# Patient Record
Sex: Male | Born: 1950
Health system: Southern US, Community
[De-identification: ages and names within clinical notes are randomized; demographics above are authoritative.]

## PROBLEM LIST (undated history)

## (undated) DIAGNOSIS — G893 Neoplasm related pain (acute) (chronic): Secondary | ICD-10-CM

## (undated) DIAGNOSIS — Z87442 Personal history of urinary calculi: Secondary | ICD-10-CM

## (undated) DIAGNOSIS — M2550 Pain in unspecified joint: Secondary | ICD-10-CM

## (undated) DIAGNOSIS — E785 Hyperlipidemia, unspecified: Secondary | ICD-10-CM

## (undated) DIAGNOSIS — Z9289 Personal history of other medical treatment: Secondary | ICD-10-CM

## (undated) DIAGNOSIS — I2510 Atherosclerotic heart disease of native coronary artery without angina pectoris: Secondary | ICD-10-CM

## (undated) DIAGNOSIS — M79601 Pain in right arm: Secondary | ICD-10-CM

## (undated) DIAGNOSIS — C61 Malignant neoplasm of prostate: Secondary | ICD-10-CM

## (undated) DIAGNOSIS — C419 Malignant neoplasm of bone and articular cartilage, unspecified: Secondary | ICD-10-CM

## (undated) DIAGNOSIS — M79671 Pain in right foot: Secondary | ICD-10-CM

## (undated) DIAGNOSIS — I10 Essential (primary) hypertension: Secondary | ICD-10-CM

## (undated) DIAGNOSIS — M79604 Pain in right leg: Secondary | ICD-10-CM

## (undated) DIAGNOSIS — M549 Dorsalgia, unspecified: Secondary | ICD-10-CM

## (undated) DIAGNOSIS — Z720 Tobacco use: Secondary | ICD-10-CM

## (undated) DIAGNOSIS — F329 Major depressive disorder, single episode, unspecified: Secondary | ICD-10-CM

## (undated) DIAGNOSIS — F32A Depression, unspecified: Secondary | ICD-10-CM

## (undated) HISTORY — PX: CARDIAC CATHETERIZATION: SHX172

## (undated) HISTORY — DX: Dorsalgia, unspecified: M54.9

## (undated) HISTORY — DX: Malignant neoplasm of prostate: C61

## (undated) HISTORY — PX: SPINE SURGERY: SHX786

## (undated) HISTORY — DX: Neoplasm related pain (acute) (chronic): G89.3

## (undated) HISTORY — DX: Pain in unspecified joint: M25.50

## (undated) HISTORY — PX: CERVIX SURGERY: SHX593

## (undated) HISTORY — PX: WRIST SURGERY: SHX841

## (undated) HISTORY — DX: Malignant neoplasm of bone and articular cartilage, unspecified: C41.9

## (undated) HISTORY — DX: Essential (primary) hypertension: I10

## (undated) HISTORY — DX: Personal history of urinary calculi: Z87.442

## (undated) HISTORY — PX: TONSILLECTOMY: SUR1361

## (undated) HISTORY — PX: PROSTATECTOMY: SHX69

## (undated) HISTORY — DX: Depression, unspecified: F32.A

## (undated) HISTORY — DX: Major depressive disorder, single episode, unspecified: F32.9

---

## 2004-08-17 ENCOUNTER — Ambulatory Visit: Payer: Self-pay | Admitting: Family Medicine

## 2005-07-19 ENCOUNTER — Ambulatory Visit: Payer: Self-pay | Admitting: Internal Medicine

## 2006-03-22 ENCOUNTER — Ambulatory Visit: Payer: Self-pay | Admitting: Cardiovascular Disease

## 2007-04-23 ENCOUNTER — Ambulatory Visit: Payer: Medicare Other | Admitting: Internal Medicine

## 2007-09-19 ENCOUNTER — Ambulatory Visit: Payer: Medicare Other | Admitting: Internal Medicine

## 2007-12-31 ENCOUNTER — Ambulatory Visit: Payer: Medicare Other | Admitting: Internal Medicine

## 2008-04-06 ENCOUNTER — Ambulatory Visit: Payer: Self-pay | Admitting: Internal Medicine

## 2008-06-03 ENCOUNTER — Ambulatory Visit: Payer: Self-pay | Admitting: Internal Medicine

## 2008-06-22 ENCOUNTER — Ambulatory Visit: Payer: Self-pay | Admitting: Internal Medicine

## 2009-01-14 ENCOUNTER — Ambulatory Visit: Payer: Self-pay | Admitting: Family Medicine

## 2009-04-29 ENCOUNTER — Ambulatory Visit: Payer: Self-pay | Admitting: Family Medicine

## 2010-02-09 ENCOUNTER — Emergency Department: Payer: Self-pay | Admitting: Unknown Physician Specialty

## 2010-08-01 DIAGNOSIS — M549 Dorsalgia, unspecified: Secondary | ICD-10-CM | POA: Insufficient documentation

## 2010-08-01 DIAGNOSIS — G2581 Restless legs syndrome: Secondary | ICD-10-CM | POA: Insufficient documentation

## 2010-08-19 ENCOUNTER — Ambulatory Visit: Payer: Self-pay | Admitting: Family Medicine

## 2010-09-02 ENCOUNTER — Ambulatory Visit: Payer: Self-pay | Admitting: Family Medicine

## 2012-05-14 ENCOUNTER — Ambulatory Visit: Payer: Self-pay | Admitting: Internal Medicine

## 2012-07-15 ENCOUNTER — Ambulatory Visit: Payer: Self-pay | Admitting: Family Medicine

## 2012-07-15 LAB — COMPREHENSIVE METABOLIC PANEL
Alkaline Phosphatase: 74 U/L (ref 50–136)
Anion Gap: 11 (ref 7–16)
Bilirubin,Total: 0.6 mg/dL (ref 0.2–1.0)
Calcium, Total: 9.6 mg/dL (ref 8.5–10.1)
Chloride: 103 mmol/L (ref 98–107)
EGFR (African American): >60
EGFR (Non-African Amer.): >60
Glucose: 117 mg/dL — ABNORMAL HIGH (ref 65–99)

## 2012-07-15 LAB — LIPID PANEL
Cholesterol: 204 mg/dL — ABNORMAL HIGH (ref 0–200)
Ldl Cholesterol, Calc: 106 mg/dL — ABNORMAL HIGH (ref 0–100)
Triglycerides: 226 mg/dL — ABNORMAL HIGH (ref 0–200)
VLDL Cholesterol, Calc: 45 mg/dL — ABNORMAL HIGH (ref 5–40)

## 2012-07-15 LAB — CBC WITH DIFFERENTIAL/PLATELET
Eosinophil #: 0.2 10*3/uL (ref 0.0–0.7)
Eosinophil %: 2.5 %
HCT: 47.4 % (ref 40.0–52.0)
Lymphocyte #: 2.1 10*3/uL (ref 1.0–3.6)
MCH: 31.2 pg (ref 26.0–34.0)
MCV: 91 fL (ref 80–100)
Monocyte #: 0.5 x10 3/mm (ref 0.2–1.0)
Monocyte %: 8.7 %
RBC: 5.2 10*6/uL (ref 4.40–5.90)
RDW: 13.7 % (ref 11.5–14.5)
WBC: 6 10*3/uL (ref 3.8–10.6)

## 2012-07-16 LAB — PSA: PSA: 14.2 ng/mL — ABNORMAL HIGH (ref 0.0–4.0)

## 2012-08-08 DIAGNOSIS — K219 Gastro-esophageal reflux disease without esophagitis: Secondary | ICD-10-CM | POA: Insufficient documentation

## 2012-08-08 DIAGNOSIS — R399 Unspecified symptoms and signs involving the genitourinary system: Secondary | ICD-10-CM | POA: Insufficient documentation

## 2012-08-08 DIAGNOSIS — N529 Male erectile dysfunction, unspecified: Secondary | ICD-10-CM | POA: Insufficient documentation

## 2012-08-13 ENCOUNTER — Ambulatory Visit: Payer: Self-pay | Admitting: Internal Medicine

## 2012-08-15 ENCOUNTER — Ambulatory Visit: Payer: Self-pay | Admitting: Internal Medicine

## 2012-08-15 ENCOUNTER — Ambulatory Visit: Payer: Self-pay | Admitting: Hematology and Oncology

## 2012-08-15 LAB — HEPATIC FUNCTION PANEL A (ARMC)
Albumin: 4.1 g/dL (ref 3.4–5.0)
Alkaline Phosphatase: 74 U/L (ref 50–136)
Bilirubin,Total: 0.4 mg/dL (ref 0.2–1.0)
SGOT(AST): 19 U/L (ref 15–37)
SGPT (ALT): 29 U/L (ref 12–78)
Total Protein: 7.4 g/dL (ref 6.4–8.2)

## 2012-08-15 LAB — BASIC METABOLIC PANEL
Anion Gap: 6 — ABNORMAL LOW (ref 7–16)
Calcium, Total: 9.3 mg/dL (ref 8.5–10.1)
Chloride: 105 mmol/L (ref 98–107)
Co2: 30 mmol/L (ref 21–32)
EGFR (African American): >60
Osmolality: 280 (ref 275–301)
Potassium: 4.7 mmol/L (ref 3.5–5.1)
Sodium: 141 mmol/L (ref 136–145)

## 2012-08-15 LAB — APTT: Activated PTT: 33.6 secs (ref 23.6–35.9)

## 2012-08-15 LAB — PROTIME-INR: INR: 0.9

## 2012-08-15 LAB — CBC CANCER CENTER
Basophil #: 0 x10 3/mm (ref 0.0–0.1)
Basophil %: 0.7 %
Eosinophil #: 0.2 x10 3/mm (ref 0.0–0.7)
Eosinophil %: 3.4 %
HGB: 15.7 g/dL (ref 13.0–18.0)
Lymphocyte #: 2.3 x10 3/mm (ref 1.0–3.6)
Lymphocyte %: 31.5 %
MCV: 91 fL (ref 80–100)
Monocyte %: 9.7 %
Neutrophil %: 54.7 %
Platelet: 120 x10 3/mm — ABNORMAL LOW (ref 150–440)
RBC: 5.02 10*6/uL (ref 4.40–5.90)
RDW: 13.6 % (ref 11.5–14.5)
WBC: 7.2 x10 3/mm (ref 3.8–10.6)

## 2012-08-16 LAB — PSA: PSA: 14.6 ng/mL — ABNORMAL HIGH (ref 0.0–4.0)

## 2012-09-02 ENCOUNTER — Ambulatory Visit: Payer: Self-pay | Admitting: Internal Medicine

## 2012-10-02 ENCOUNTER — Ambulatory Visit: Payer: Self-pay | Admitting: Internal Medicine

## 2012-10-09 ENCOUNTER — Ambulatory Visit (INDEPENDENT_AMBULATORY_CARE_PROVIDER_SITE_OTHER): Payer: 59 | Admitting: Cardiovascular Disease

## 2012-10-09 ENCOUNTER — Encounter: Payer: Self-pay | Admitting: Cardiovascular Disease

## 2012-10-09 VITALS — BP 120/84 | HR 73 | Ht 71.0 in | Wt 207.8 lb

## 2012-10-09 DIAGNOSIS — R262 Difficulty in walking, not elsewhere classified: Secondary | ICD-10-CM

## 2012-10-09 DIAGNOSIS — R079 Chest pain, unspecified: Secondary | ICD-10-CM

## 2012-10-09 DIAGNOSIS — R0602 Shortness of breath: Secondary | ICD-10-CM

## 2012-10-09 DIAGNOSIS — M549 Dorsalgia, unspecified: Secondary | ICD-10-CM

## 2012-10-09 DIAGNOSIS — I2510 Atherosclerotic heart disease of native coronary artery without angina pectoris: Secondary | ICD-10-CM | POA: Insufficient documentation

## 2012-10-09 DIAGNOSIS — F17200 Nicotine dependence, unspecified, uncomplicated: Secondary | ICD-10-CM | POA: Insufficient documentation

## 2012-10-09 HISTORY — DX: Dorsalgia, unspecified: M54.9

## 2012-10-09 NOTE — Assessment & Plan Note (Signed)
As he is unable to treadmill secondary to his back pain/chronic back disease and shortness of breath, pharmacologic Myoview will be ordered.

## 2012-10-09 NOTE — Assessment & Plan Note (Signed)
He has 40% mid LAD and RCA disease by cardiac catheterization in 2008. Continues to smoke, now with chest pain and worsening shortness of breath. We have discussed with him the various treatment options including stress testing and cardiac catheterization. He prefers stress testing or this will be arranged for him next week.

## 2012-10-09 NOTE — Patient Instructions (Addendum)
You are doing well. No medication changes were made.  [facility] MYOVIEW  Your caregiver has ordered a Stress Test with nuclear imaging. The purpose of this test is to evaluate the blood supply to your heart muscle. This procedure is referred to as a "Non-Invasive Stress Test." This is because other than having an IV started in your vein, nothing is inserted or "invades" your body. Cardiac stress tests are done to find areas of poor blood flow to the heart by determining the extent of coronary artery disease (CAD). Some patients exercise on a treadmill, which naturally increases the blood flow to your heart, while others who are  unable to walk on a treadmill due to physical limitations have a pharmacologic/chemical stress agent called Lexiscan . This medicine will mimic walking on a treadmill by temporarily increasing your coronary blood flow.   Please note: these test may take anywhere between 2-4 hours to complete  PLEASE REPORT TO [facility]  THE VOLUNTEERS AT THE FIRST DESK WILL DIRECT YOU WHERE TO GO  Date of Procedure:______Wednesday, Oct 15______________  Arrival Time for Procedure:__________7:45 am_____________  Instructions regarding medication:    __X__:  Hold metoprolol the morning of procedure   PLEASE NOTIFY THE OFFICE AT LEAST 24 HOURS IN ADVANCE IF YOU ARE UNABLE TO KEEP YOUR APPOINTMENT.  [phone] AND  PLEASE NOTIFY NUCLEAR MEDICINE AT [facility] AT LEAST 24 HOURS IN ADVANCE IF YOU ARE UNABLE TO KEEP YOUR APPOINTMENT. [phone]  How to prepare for your Myoview test:  1. Do not eat or drink after midnight 2. No caffeine for 24 hours prior to test 3. No smoking 24 hours prior to test. 4. Your medication may be taken with water.  If your doctor stopped a medication because of this test, do not take that medication. 5. Ladies, please do not wear dresses.  Skirts or pants are appropriate. Please wear a short sleeve shirt. 6. No perfume, cologne or  lotion. 7. Wear comfortable walking shoes. No heels!            Please call us if you have new issues that need to be addressed before your next appt.  Your physician wants you to follow-up in: 12 months.  You will receive a reminder letter in the mail two months in advance. If you don't receive a letter, please call our office to schedule the follow-up appointment.
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
## 2012-10-09 NOTE — Assessment & Plan Note (Signed)
Etiology of his shortness of breath is unclear. Unable to exclude ischemia. Also likely has underlying COPD. If no ischemia on stress test, he may benefit from regular exercise program and possibly a trial with inhalers.

## 2012-10-09 NOTE — Assessment & Plan Note (Signed)
High risk for worsening coronary disease. Encouraged him to stop smoking. Stress test will be arranged. He is unable to treadmill. Pharmacologic Myoview will be scheduled.

## 2012-10-09 NOTE — Assessment & Plan Note (Signed)
We have encouraged him to continue to work on weaning his cigarettes and smoking cessation. He will continue to work on this and does not want any assistance with chantix.  

## 2012-10-16 ENCOUNTER — Ambulatory Visit: Payer: Self-pay | Admitting: Cardiovascular Disease

## 2012-10-16 DIAGNOSIS — R0602 Shortness of breath: Secondary | ICD-10-CM

## 2012-10-17 ENCOUNTER — Other Ambulatory Visit: Payer: Self-pay

## 2012-10-17 DIAGNOSIS — R0602 Shortness of breath: Secondary | ICD-10-CM

## 2012-10-17 DIAGNOSIS — R262 Difficulty in walking, not elsewhere classified: Secondary | ICD-10-CM

## 2012-10-17 DIAGNOSIS — I2510 Atherosclerotic heart disease of native coronary artery without angina pectoris: Secondary | ICD-10-CM

## 2012-10-17 DIAGNOSIS — R079 Chest pain, unspecified: Secondary | ICD-10-CM

## 2012-10-21 ENCOUNTER — Telehealth: Payer: Self-pay

## 2012-10-21 NOTE — Telephone Encounter (Signed)
Voice mail box not set up.

## 2012-10-21 NOTE — Telephone Encounter (Signed)
Message copied by Marilynne Halsted on Mon Oct 21, 2012 10:00 AM ------      Message from: Robert Short      Created: Sun Oct 20, 2012  6:49 PM       Normal stress test, myoview ------

## 2012-10-21 NOTE — Telephone Encounter (Signed)
Message copied by Marilynne Halsted on Mon Oct 21, 2012 10:11 AM ------      Message from: Antonieta Iba      Created: Sun Oct 20, 2012  6:49 PM       Normal stress test, myoview ------

## 2012-10-22 NOTE — Telephone Encounter (Signed)
Spoke w/ pt.  He is aware of results.  

## 2012-10-22 NOTE — Telephone Encounter (Signed)
Message copied by Marilynne Halsted on Tue Oct 22, 2012  2:53 PM ------      Message from: Antonieta Iba      Created: Sun Oct 20, 2012  6:49 PM       Normal stress test, myoview ------

## 2012-12-05 ENCOUNTER — Ambulatory Visit: Payer: Self-pay | Admitting: Internal Medicine

## 2012-12-05 LAB — CBC CANCER CENTER
Basophil #: 0.1 x10 3/mm (ref 0.0–0.1)
Basophil %: 0.6 %
Eosinophil %: 4.3 %
HGB: 13.8 g/dL (ref 13.0–18.0)
MCV: 95 fL (ref 80–100)
Monocyte #: 0.9 x10 3/mm (ref 0.2–1.0)
Monocyte %: 10.1 %
Neutrophil #: 6.2 x10 3/mm (ref 1.4–6.5)
Neutrophil %: 68.5 %
Platelet: 149 x10 3/mm — ABNORMAL LOW (ref 150–440)
RBC: 4.35 10*6/uL — ABNORMAL LOW (ref 4.40–5.90)
RDW: 13.3 % (ref 11.5–14.5)

## 2012-12-05 LAB — HEPATIC FUNCTION PANEL A (ARMC)
Albumin: 3.9 g/dL (ref 3.4–5.0)
Alkaline Phosphatase: 77 U/L
Bilirubin,Total: 0.4 mg/dL (ref 0.2–1.0)
SGOT(AST): 20 U/L (ref 15–37)

## 2012-12-05 LAB — CREATININE, SERUM
EGFR (African American): >60
EGFR (Non-African Amer.): >60

## 2012-12-06 LAB — PSA: PSA: 0.2 ng/mL (ref 0.0–4.0)

## 2013-01-02 ENCOUNTER — Ambulatory Visit: Payer: Self-pay | Admitting: Internal Medicine

## 2013-04-19 ENCOUNTER — Emergency Department: Payer: Self-pay | Admitting: Emergency Medicine

## 2013-04-19 LAB — COMPREHENSIVE METABOLIC PANEL
ALBUMIN: 4.1 g/dL (ref 3.4–5.0)
ANION GAP: 10 (ref 7–16)
Alkaline Phosphatase: 82 U/L
BUN: 15 mg/dL (ref 7–18)
Bilirubin,Total: 0.2 mg/dL (ref 0.2–1.0)
CHLORIDE: 106 mmol/L (ref 98–107)
CO2: 25 mmol/L (ref 21–32)
CREATININE: 1.06 mg/dL (ref 0.60–1.30)
Calcium, Total: 9.2 mg/dL (ref 8.5–10.1)
EGFR (African American): >60
EGFR (Non-African Amer.): >60
Glucose: 155 mg/dL — ABNORMAL HIGH (ref 65–99)
OSMOLALITY: 285 (ref 275–301)
Potassium: 3.2 mmol/L — ABNORMAL LOW (ref 3.5–5.1)
SGOT(AST): 21 U/L (ref 15–37)
SGPT (ALT): 36 U/L (ref 12–78)
Sodium: 141 mmol/L (ref 136–145)
TOTAL PROTEIN: 7.6 g/dL (ref 6.4–8.2)

## 2013-04-19 LAB — URINALYSIS, COMPLETE
Bacteria: NONE SEEN
Bilirubin,UR: NEGATIVE
Blood: NEGATIVE
LEUKOCYTE ESTERASE: NEGATIVE
NITRITE: NEGATIVE
Ph: 5 (ref 4.5–8.0)
Protein: NEGATIVE
RBC,UR: <1 /HPF (ref 0–5)
SPECIFIC GRAVITY: 1.027 (ref 1.003–1.030)
Squamous Epithelial: 1
WBC UR: 1 /HPF (ref 0–5)

## 2013-04-19 LAB — CBC
HCT: 39.9 % — AB (ref 40.0–52.0)
HGB: 13.2 g/dL (ref 13.0–18.0)
MCH: 31 pg (ref 26.0–34.0)
MCHC: 33 g/dL (ref 32.0–36.0)
MCV: 94 fL (ref 80–100)
Platelet: 145 10*3/uL — ABNORMAL LOW (ref 150–440)
RBC: 4.25 10*6/uL — AB (ref 4.40–5.90)
RDW: 14.2 % (ref 11.5–14.5)
WBC: 7.3 10*3/uL (ref 3.8–10.6)

## 2013-04-19 LAB — TROPONIN I: Troponin-I: <0.02 ng/mL

## 2013-05-23 ENCOUNTER — Ambulatory Visit: Payer: Self-pay | Admitting: Family Medicine

## 2013-07-11 ENCOUNTER — Ambulatory Visit: Payer: Self-pay | Admitting: Otolaryngology

## 2013-07-13 ENCOUNTER — Observation Stay: Payer: Self-pay | Admitting: Internal Medicine

## 2013-07-13 LAB — COMPREHENSIVE METABOLIC PANEL
AST: 26 U/L (ref 15–37)
Albumin: 4.3 g/dL (ref 3.4–5.0)
Alkaline Phosphatase: 74 U/L
Anion Gap: 7 (ref 7–16)
BILIRUBIN TOTAL: 0.4 mg/dL (ref 0.2–1.0)
BUN: 11 mg/dL (ref 7–18)
CHLORIDE: 107 mmol/L (ref 98–107)
CREATININE: 1.01 mg/dL (ref 0.60–1.30)
Calcium, Total: 9.9 mg/dL (ref 8.5–10.1)
Co2: 26 mmol/L (ref 21–32)
EGFR (Non-African Amer.): >60
Glucose: 106 mg/dL — ABNORMAL HIGH (ref 65–99)
OSMOLALITY: 279 (ref 275–301)
Potassium: 4.3 mmol/L (ref 3.5–5.1)
SGPT (ALT): 32 U/L (ref 12–78)
Sodium: 140 mmol/L (ref 136–145)
TOTAL PROTEIN: 8.1 g/dL (ref 6.4–8.2)

## 2013-07-13 LAB — URINALYSIS, COMPLETE
BILIRUBIN, UR: NEGATIVE
Glucose,UR: NEGATIVE mg/dL (ref 0–75)
KETONE: NEGATIVE
NITRITE: POSITIVE
Ph: 6 (ref 4.5–8.0)
RBC,UR: 22 /HPF (ref 0–5)
Specific Gravity: 1.008 (ref 1.003–1.030)
Squamous Epithelial: <1
WBC UR: 13 /HPF (ref 0–5)

## 2013-07-13 LAB — CBC
HCT: 42.7 % (ref 40.0–52.0)
HGB: 14.3 g/dL (ref 13.0–18.0)
MCH: 32.2 pg (ref 26.0–34.0)
MCHC: 33.5 g/dL (ref 32.0–36.0)
MCV: 96 fL (ref 80–100)
Platelet: 169 10*3/uL (ref 150–440)
RBC: 4.44 10*6/uL (ref 4.40–5.90)
RDW: 13.2 % (ref 11.5–14.5)
WBC: 10.7 10*3/uL — ABNORMAL HIGH (ref 3.8–10.6)

## 2013-07-13 LAB — LIPASE, BLOOD: Lipase: 107 U/L (ref 73–393)

## 2013-07-15 LAB — URINE CULTURE

## 2013-07-23 ENCOUNTER — Other Ambulatory Visit: Payer: Self-pay | Admitting: Neurosurgery

## 2013-07-23 DIAGNOSIS — M5126 Other intervertebral disc displacement, lumbar region: Secondary | ICD-10-CM

## 2013-07-24 ENCOUNTER — Other Ambulatory Visit: Payer: Self-pay | Admitting: Neurosurgery

## 2013-07-24 ENCOUNTER — Ambulatory Visit
Admission: RE | Admit: 2013-07-24 | Discharge: 2013-07-24 | Disposition: A | Discharge status: Home / Self Care | Payer: 59 | Source: Ambulatory Visit | Attending: Neurosurgery | Admitting: Neurosurgery

## 2013-07-24 DIAGNOSIS — M5126 Other intervertebral disc displacement, lumbar region: Secondary | ICD-10-CM

## 2013-07-24 MED ORDER — METHYLPREDNISOLONE ACETATE 40 MG/ML INJ SUSP (RADIOLOG
120.0000 mg | Freq: Once | INTRAMUSCULAR | Status: AC
  Administered 2013-07-24: 120 mg via EPIDURAL

## 2013-07-24 MED ORDER — IOHEXOL 180 MG/ML  SOLN
1.0000 mL | Freq: Once | INTRAMUSCULAR | Status: AC | PRN
  Administered 2013-07-24: 1 mL via EPIDURAL

## 2013-07-24 NOTE — Discharge Instructions (Signed)

## 2013-11-03 ENCOUNTER — Ambulatory Visit: Payer: Self-pay

## 2013-11-03 LAB — CBC WITH DIFFERENTIAL/PLATELET
Basophil #: 0.1 10*3/uL (ref 0.0–0.1)
Basophil %: 0.7 %
Eosinophil #: 0.1 10*3/uL (ref 0.0–0.7)
Eosinophil %: 0.6 %
HCT: 44.2 % (ref 40.0–52.0)
HGB: 14.4 g/dL (ref 13.0–18.0)
LYMPHS PCT: 14.2 %
Lymphocyte #: 2 10*3/uL (ref 1.0–3.6)
MCH: 31.1 pg (ref 26.0–34.0)
MCHC: 32.6 g/dL (ref 32.0–36.0)
MCV: 96 fL (ref 80–100)
MONO ABS: 1.3 x10 3/mm — AB (ref 0.2–1.0)
Monocyte %: 9.3 %
Neutrophil #: 10.7 10*3/uL — ABNORMAL HIGH (ref 1.4–6.5)
Neutrophil %: 75.2 %
Platelet: 149 10*3/uL — ABNORMAL LOW (ref 150–440)
RBC: 4.63 10*6/uL (ref 4.40–5.90)
RDW: 13.5 % (ref 11.5–14.5)
WBC: 14.2 10*3/uL — ABNORMAL HIGH (ref 3.8–10.6)

## 2014-04-25 NOTE — H&P (Signed)
PATIENT NAME:  Robert Short, Robert Short MR#:  [id number] DATE OF BIRTH:  1950/09/08  DATE OF ADMISSION:  07/13/2013  PRIMARY CARE PHYSICIAN: Dr. Larene Beach.   REFERRING PHYSICIAN: Dr. Delman Kitten.   CHIEF COMPLAINT: Back pain.   HISTORY OF PRESENT ILLNESS: Robert Short is a 64 year old male with history of chronic back pain, gets the pain all over the back for the last few months on and off. Gets sharp pains in the coccyx area. However, the patient woke up yesterday morning around 4:00, had some mild pain on the right side of the back, more diffuse in nature. Went back to bed. Woke up around 9:00 in the morning, started to experience a severe pain, a cramp-like pain, 10/10 intensity, radiating posterior aspect of the leg into the right knee. The patient took his home pain medications without much improvement, concerning this, came to the Emergency Department. Workup in the Emergency Department: CT abdomen and pelvis negative for any urinary tract stone, negative for any nephrolithiasis, mild urinary bladder thickening. UA shows 2+ blood, nitrite positive, RBC of 22, and WBC of 13. The patient also stated that had some hematuria. CBC is completely within normal limits and CMP are completely within normal limits. The patient also underwent MRI of the spine, showed acute-appearing L3-L4 right central moderate disk extrusion with 14 mm of superior contiguous  likely transversing the right L3 nerve, moderate-to-severe canal stenosis at L3-L4, L2-L3, and L4-L5. Moderate-to-severe on right L5-S1. Concerning these findings, the decision is made to observe the patient and control the pain. The patient denies having any weakness in the right leg. Denies having any urinary incontinence or retention, or fecal incontinence. The patient received multiple doses of pain medications, 10 mg of morphine, 3 mg of Dilaudid and Toradol with some improvement with the pain.   PAST MEDICAL HISTORY:  1. History of prostate cancer. 2.  Hypertension.  3. Hyperlipidemia.  4. Borderline diabetes mellitus.  5. Depression.  6. Prostate cancer, status post prostatectomy in September 2011 with elevated PSA.  7. Migraine headaches.  8. Insomnia.  9. Cataract surgery.  10. Nephrolithiasis.  11. Continued tobacco use.  12. Continued alcohol use.   ALLERGIES: No known drug allergies.   HOME MEDICATIONS:  1. Tylenol 1000 mg every 6 hours as needed.  2. Oxycodone 5 mg every 6 hours as needed.  3. Toprol-XL once a day.  4. Lyrica 75 mg 2 times a day.  5. Etodolac 500 mg 2 times a day.  6. Cymbalta 60 mg once a day.  7. Benicar 40 mg once a day.  8. Baclofen 10 mg 3 times a day.   SOCIAL HISTORY: Continues to smoke 1 pack a day. Drinks alcohol 2 to 3 beers to 6-pack a day. Denies using any illicit drugs. Married, lives with his wife.   FAMILY HISTORY: Breast cancer, lung cancer, and hypertension.   REVIEW OF SYSTEMS:  CONSTITUTIONAL: Denies any generalized weakness.  EYES: No change in vision.  ENT: No change in hearing.  RESPIRATORY: No cough, shortness of breath.  CARDIOVASCULAR: No chest pain, palpations.  GASTROINTESTINAL: No nausea, vomiting, abdominal pain.  GENITOURINARY: No dysuria. Had hematuria.  ENDOCRINE: No polyuria or polydipsia.  SKIN: No rash or lesions.  MUSCULOSKELETAL: Chronic back pain.  NEUROLOGIC: No weakness or numbness in any part of the body.   PHYSICAL EXAMINATION:  GENERAL: This is a well-built, well-nourished, age-appropriate male lying down in the bed, not in distress.  VITAL SIGNS: Temperature 98.3, pulse 62, blood  pressure 163/99, respiratory rate of 18, oxygen saturation is 98% on room air.  HEENT: Head normocephalic, atraumatic. No scleral icterus. Conjunctivae normal. Pupils equal and react to light. Extraocular movements are intact. Mucous membranes moist. No pharyngeal erythema.  NECK: Supple. No lymphadenopathy. No JVD. No carotid bruit. No thyromegaly.  CHEST: Has no focal  tenderness.  LUNGS: Bilaterally clear to auscultation.  HEART: S1, S2 regular. No murmurs are heard.  ABDOMEN: Bowel sounds present. Soft, nontender, nondistended. No hepatosplenomegaly. On the right back side, in the L4-L5 area, has mild tenderness to palpation. Leg raise sign is negative.  EXTREMITIES: No pedal edema. Pulses 2+.  NEUROLOGIC: The patient is alert, oriented to place, person, and time. Cranial nerves II through XII intact. Motor 5/5 in upper and lower extremities.  SKIN: No rash or lesions.  MUSCULOSKELETAL: Good range of motion in all the extremities.   LABORATORIES: CMP is completely within normal limits.   CBC is completely within normal limits.   UA: 2+ blood, trace leukocyte esterase, nitrites positive. WBC of 13.   CT abdomen and pelvis as mentioned above, mild bladder wall thickening.   MRI has multilevel spinal canal stenosis with radiculopathy.   ASSESSMENT AND PLAN: Robert Short is a 64 year old male who comes with acute back pain.  1. Acute back pain. The patient had multiple bulging disks at 2 different levels. We will consult orthopedic surgery in the morning. Continue with the oxycodone as needed. Does not have any neurologic deficits.  2. Urinary tract infection. Urine cultures have been sent. Continue with Rocephin.  3. Hypertension, poorly controlled. The patient states he did not take his medications: Start back on home medications and follow up.  4. Hematuria. The patient has history of prostate cancer. This could be secondary to urinary tract infection.  5. Tobacco use, keep the patient on nicotine patch. 6. Alcohol use. Give thiamine, watch for any signs of alcohol withdrawal.  7. Keep the patient on deep vein thrombosis prophylaxis with sequential compression devices.   TIME SPENT: 50 minutes.   ____________________________ Monica Becton, MD pv:lt D: 07/13/2013 22:36:36 ET T: 07/13/2013 23:31:14 ET JOB#: [id number]  cc: Monica Becton, MD,  <Dictator> Arlis Porta., MD Monica Becton MD ELECTRONICALLY SIGNED 07/16/2013 0:04

## 2014-04-25 NOTE — Discharge Summary (Signed)
PATIENT NAME:  Robert Short, Robert Short MR#:  [id number] DATE OF BIRTH:  10/24/1950  DATE OF ADMISSION:  07/13/2013. DATE OF DISCHARGE:  07/15/2013.  ADMITTING DIAGNOSIS:  Acute onset of worsening lower back pain.  DISCHARGE DIAGNOSES:  1.  Back pain due to disk herniation in the lumbar spine with severe spinal stenosis.  2.  Chronic back pain.  3.  History of prostate cancer.  4.  Hypertension.  5.  Hyperlipidemia.  6.  Borderline diabetes.  7.  Depression.  8.  Prostate cancer, status post prostatectomy in September 2011 with elevated PSA.  9.  Migraine headaches.  10. Insomnia.  11. Status post cataract surgery.  12. History of nephrolithiasis.  13. Continued tobacco use.  14. Continue alcohol use.  15. Urinary tract infection.  CONSULTANTS: Dr. Mack Guise was supposed to see the patient but patient was discharged prior to his evaluation.   LABORATORY DATA: On admission: Glucose 106, BUN 11, creatinine 1.01, sodium 140, potassium 4.3, chloride 107, CO2 26, calcium 9.9, lipase 107. LFTs were normal. WBC 10.7, hemoglobin 14.3, platelet count 169,000.   Urinalysis showed Klebsiella pneumoniae resistant to nitrofurantoin, ampicillin.   HOSPITAL COURSE: Please refer to H and P done by the admitting physician. The patient is a 64 year old white male with history of chronic back pain who presented with acute onset of lower back pain. The patient was seen in the ED and they tried to discharge him but he was still having a lot of pain.  He underwent MRI of his lumbar spine which showed acute-appearing L3-L4 right central moderate disk extrusion with 14 mm of superior contiguous extent likely affecting the traversing right L3 level, no acute fracture or malalignment.  Moderate to severe canal stenosis at L3-L4, neural foraminal narrowing L3-L4 through L5-S1, moderate to severe on the right at L5-S1.   HOSPITAL COURSE: Please refer to H and P done by the admitting physician. The patient is a 64 year old  white male with history of chronic back pain who presented with acute onset of back pain without any trauma or lifting anything heavy. The patient was seen in the ED and continued to have significant pain and therefore underwent a CT of the abdomen and pelvis which was negative. He had an MRI of his spine which did show some disk herniation. Due to his pain not being controlled he was admitted to the hospital. The patient was placed on pain medications to control his symptoms. He was still able to ambulate. He only had pain. An orthopedic consult was obtained,  however, the patient was discharged prior to orthopedic evaluation; however, Dr. Mack Guise called me and told me that the patient should be referred to a neurosurgical spine specialist. He is referred to [facility]. At this time he is stable and stable for discharge.   DISCHARGE MEDICATIONS:  Metoprolol succinate 50 mg daily, Tylenol 500 2 tablets q.6 hours, baclofen 10 mg 1 tablet p.o. t.i.d., Benicar 40 mg daily, Cymbalta 30 mg 2  capsules daily, Lyrica 75 mg, 1 tablet p.o. b.i.d., etodolac 500 mg 1 tablet p.o. b.i.d., Cipro 500 mg 1 tablet p.o. q.12 hours x 5 days, oxycodone 10 mg q.4 hours p.r.n. for pain, prednisone taper starting at 60 mg, taper by 10 mg until complete. Ibuprofen 600 mg 1 tablet 4 times a day as needed for pain, ranitidine 75 mg 1 tablet p.o. p.o. b.i.d.   DIET: Low-sodium, low-fat, low-cholesterol.   ACTIVITY: As tolerated.   FOLLOWUP: With neurosurgery at [facility], follow  with primary MD in 1 to 2 weeks.   TIME SPENT:  35 minutes.   ____________________________ Lafonda Mosses Posey Pronto, MD shp:lt D: 07/16/2013 08:20:00 ET T: 07/16/2013 11:32:43 ET JOB#: [id number]  cc: Monik Lins H. Posey Pronto, MD, <Dictator> Alric Seton MD ELECTRONICALLY SIGNED 07/20/2013 9:46

## 2014-06-03 ENCOUNTER — Telehealth: Payer: Self-pay | Admitting: Family Medicine

## 2014-06-03 MED ORDER — METOPROLOL SUCCINATE ER 50 MG PO TB24: 50.0000 mg | ORAL_TABLET | Freq: Every day | ORAL | Status: DC

## 2014-06-03 MED ORDER — OLMESARTAN MEDOXOMIL 40 MG PO TABS: 40.0000 mg | ORAL_TABLET | Freq: Every day | ORAL | Status: DC

## 2014-06-03 NOTE — Telephone Encounter (Signed)
meds refill

## 2014-06-03 NOTE — Telephone Encounter (Signed)
OK to refill each for 1 month with 1 extra refill.  Get appt. in next 2 months.

## 2014-06-03 NOTE — Telephone Encounter (Signed)
Pt has not been in office since 11/2013 and advised to schedule appointment but he needs refill for Benicar and metoprolol do you want me to send his Rx please suggest

## 2014-06-04 ENCOUNTER — Ambulatory Visit (INDEPENDENT_AMBULATORY_CARE_PROVIDER_SITE_OTHER): Payer: 59 | Admitting: Family Medicine

## 2014-06-04 ENCOUNTER — Encounter: Payer: Self-pay | Admitting: Family Medicine

## 2014-06-04 VITALS — BP 187/101 | HR 81 | Temp 98.3°F | Resp 16 | Ht 71.0 in | Wt 214.8 lb

## 2014-06-04 DIAGNOSIS — F32A Depression, unspecified: Secondary | ICD-10-CM

## 2014-06-04 DIAGNOSIS — R739 Hyperglycemia, unspecified: Secondary | ICD-10-CM | POA: Diagnosis not present

## 2014-06-04 DIAGNOSIS — I10 Essential (primary) hypertension: Secondary | ICD-10-CM | POA: Insufficient documentation

## 2014-06-04 DIAGNOSIS — R200 Anesthesia of skin: Secondary | ICD-10-CM | POA: Insufficient documentation

## 2014-06-04 DIAGNOSIS — K219 Gastro-esophageal reflux disease without esophagitis: Secondary | ICD-10-CM | POA: Diagnosis not present

## 2014-06-04 DIAGNOSIS — F17200 Nicotine dependence, unspecified, uncomplicated: Secondary | ICD-10-CM | POA: Insufficient documentation

## 2014-06-04 DIAGNOSIS — F329 Major depressive disorder, single episode, unspecified: Secondary | ICD-10-CM | POA: Insufficient documentation

## 2014-06-04 DIAGNOSIS — M5441 Lumbago with sciatica, right side: Secondary | ICD-10-CM | POA: Diagnosis not present

## 2014-06-04 DIAGNOSIS — W57XXXA Bitten or stung by nonvenomous insect and other nonvenomous arthropods, initial encounter: Secondary | ICD-10-CM | POA: Insufficient documentation

## 2014-06-04 DIAGNOSIS — G629 Polyneuropathy, unspecified: Secondary | ICD-10-CM | POA: Insufficient documentation

## 2014-06-04 DIAGNOSIS — M4802 Spinal stenosis, cervical region: Secondary | ICD-10-CM | POA: Insufficient documentation

## 2014-06-04 DIAGNOSIS — E785 Hyperlipidemia, unspecified: Secondary | ICD-10-CM | POA: Insufficient documentation

## 2014-06-04 DIAGNOSIS — D8989 Other specified disorders involving the immune mechanism, not elsewhere classified: Secondary | ICD-10-CM | POA: Insufficient documentation

## 2014-06-04 DIAGNOSIS — R5382 Chronic fatigue, unspecified: Secondary | ICD-10-CM

## 2014-06-04 DIAGNOSIS — R413 Other amnesia: Secondary | ICD-10-CM | POA: Insufficient documentation

## 2014-06-04 DIAGNOSIS — G8929 Other chronic pain: Secondary | ICD-10-CM | POA: Insufficient documentation

## 2014-06-04 DIAGNOSIS — M542 Cervicalgia: Secondary | ICD-10-CM

## 2014-06-04 DIAGNOSIS — M25519 Pain in unspecified shoulder: Secondary | ICD-10-CM | POA: Insufficient documentation

## 2014-06-04 DIAGNOSIS — G9332 Myalgic encephalomyelitis/chronic fatigue syndrome: Secondary | ICD-10-CM | POA: Insufficient documentation

## 2014-06-04 MED ORDER — BACLOFEN 20 MG PO TABS: 20.0000 mg | ORAL_TABLET | Freq: Two times a day (BID) | ORAL | Status: DC

## 2014-06-04 MED ORDER — METOPROLOL SUCCINATE ER 50 MG PO TB24: 50.0000 mg | ORAL_TABLET | Freq: Every day | ORAL | Status: DC

## 2014-06-04 MED ORDER — AMLODIPINE BESYLATE 5 MG PO TABS: 5.0000 mg | ORAL_TABLET | Freq: Every day | ORAL | Status: DC

## 2014-06-04 MED ORDER — DULOXETINE HCL 30 MG PO CPEP: 30.0000 mg | ORAL_CAPSULE | Freq: Every day | ORAL | Status: DC

## 2014-06-04 MED ORDER — LOSARTAN POTASSIUM 100 MG PO TABS: 100.0000 mg | ORAL_TABLET | Freq: Every day | ORAL | Status: DC

## 2014-06-04 NOTE — Telephone Encounter (Signed)
Patient had meds refilled a visit today (06/04/14).

## 2014-06-04 NOTE — Progress Notes (Signed)
Subjective:    Patient ID: Robert Short, male    DOB: 1951-01-01, 64 y.o.   MRN: [id number]  HPI: Robert Short is a 64 y.o. male presenting on 06/04/2014 for Hypertension   HPI For f/u of HBP.  Ran  Out of Benicar 1 month ago.  C/o cost.   C/o constant back pain and lack of help for it. Says Duloxetine not help[ing depression, but will not take higher dose.  Past Medical History  Diagnosis Date  . Hypertension   . Depression   . ASCVD (arteriosclerotic cardiovascular disease)   . Back pain   . Joint pain   . Prostate cancer   . Bone cancer   . History of kidney stones    History   Social History  . Marital Status: Married    Spouse Name: N/A  . Number of Children: N/A  . Years of Education: N/A   Occupational History  . Not on file.   Social History Main Topics  . Smoking status: Current Every Day Smoker -- 1.00 packs/day for 40 years    Types: Cigarettes  . Smokeless tobacco: Not on file  . Alcohol Use: Yes     Comment: daily  . Drug Use: No  . Sexual Activity: Not on file   Other Topics Concern  . Not on file   Social History Narrative   Family History  Problem Relation Age of Onset  . Heart attack Mother   . Hypertension Mother   . Heart attack Father    Current Outpatient Prescriptions on File Prior to Visit  Medication Sig  . acetaminophen (TYLENOL) 500 MG tablet Take by mouth.  Marland Kitchen aspirin 81 MG tablet Take 81 mg by mouth daily.  . fluticasone (FLONASE) 50 MCG/ACT nasal spray Place into the nose.  . ibuprofen (ADVIL,MOTRIN) 200 MG tablet Take 200 mg by mouth every 6 (six) hours as needed for pain.  Marland Kitchen etodolac (LODINE) 500 MG tablet Take by mouth.  . olmesartan (BENICAR) 40 MG tablet Take 1 tablet (40 mg total) by mouth daily. (Patient not taking: Reported on 06/04/2014)  . pregabalin (LYRICA) 75 MG capsule Take by mouth.   No current facility-administered medications on file prior to visit.    Review of Systems  Constitutional: Positive for  fatigue.  HENT: Negative.   Respiratory: Negative.   Cardiovascular: Negative.   Gastrointestinal: Negative.   Endocrine: Negative for polydipsia, polyphagia and polyuria.  Genitourinary: Negative.   Musculoskeletal: Positive for back pain and neck pain.  Psychiatric/Behavioral: Positive for decreased concentration and agitation. The patient is nervous/anxious.    Per HPI unless specifically indicated above     Objective:    BP 187/101 mmHg  Pulse 81  Temp(Src) 98.3 F (36.8 C) (Oral)  Resp 16  Ht 5\' 11"  (1.803 m)  Wt 214 lb 12.8 oz (97.433 kg)  BMI 29.97 kg/m2  Wt Readings from Last 3 Encounters:  06/04/14 214 lb 12.8 oz (97.433 kg)  11/03/13 210 lb (95.255 kg)  10/09/12 207 lb 12 oz (94.235 kg)    Physical Exam  Constitutional: He is oriented to person, place, and time. He appears well-developed and well-nourished.  Eyes: Pupils are equal, round, and reactive to light.  Neck: Normal range of motion. Neck supple. No thyromegaly present.  Cardiovascular: Normal rate, regular rhythm, normal heart sounds and intact distal pulses.  Exam reveals no gallop and no friction rub.   No murmur heard. Pulmonary/Chest: Effort normal and breath sounds normal.  No respiratory distress. He has no wheezes. He has no rales. He exhibits no tenderness.  Musculoskeletal:  Moves slowly from back pain from neck to lumbar spine.  Movement is slowed and painful.  Lymphadenopathy:    He has no cervical adenopathy.  Neurological: He is alert and oriented to person, place, and time.  Psychiatric:  Affect is depressed.  Non-suicidal.  Vitals reviewed.      Assessment & Plan:   Problem List Items Addressed This Visit    Back pain   Relevant Medications   baclofen (LIORESAL) 20 MG tablet   Other Relevant Orders   Ambulatory referral to Pain Clinic   Clinical depression   Relevant Medications   DULoxetine (CYMBALTA) 30 MG capsule   Essential (primary) hypertension - Primary   Relevant  Medications   losartan (COZAAR) 100 MG tablet   amLODipine (NORVASC) 5 MG tablet   metoprolol succinate (TOPROL-XL) 50 MG 24 hr tablet   Acid reflux   Blood glucose elevated      Meds ordered this encounter  Medications  . losartan (COZAAR) 100 MG tablet    Sig: Take 1 tablet (100 mg total) by mouth daily.    Dispense:  90 tablet    Refill:  3  . amLODipine (NORVASC) 5 MG tablet    Sig: Take 1 tablet (5 mg total) by mouth daily.    Dispense:  30 tablet    Refill:  12  . baclofen (LIORESAL) 20 MG tablet    Sig: Take 1 tablet (20 mg total) by mouth 2 (two) times daily.    Dispense:  60 each    Refill:  12  . DULoxetine (CYMBALTA) 30 MG capsule    Sig: Take 1 capsule (30 mg total) by mouth daily.    Dispense:  30 capsule    Refill:  12  . metoprolol succinate (TOPROL-XL) 50 MG 24 hr tablet    Sig: Take 1 tablet (50 mg total) by mouth daily. Take with or immediately following a meal.    Dispense:  30 tablet    Refill:  12      Follow up plan: Return in about 1 month (around 07/04/2014), or if symptoms worsen or fail to improve.

## 2014-06-05 ENCOUNTER — Telehealth: Payer: Self-pay

## 2014-06-05 NOTE — Telephone Encounter (Signed)
Need to send the paperwork for [facility]. [facility]

## 2014-06-05 NOTE — Telephone Encounter (Signed)
Left message

## 2014-06-17 ENCOUNTER — Ambulatory Visit: Payer: Self-pay | Admitting: Family Medicine

## 2014-06-18 ENCOUNTER — Telehealth: Payer: Self-pay | Admitting: Family Medicine

## 2014-06-18 NOTE — Telephone Encounter (Signed)
Spoke to [place] from [facility] who needs the last three clinical notes for the pt. She also states if there were any MRIs or testing of this nature she will need those reports.  Call back [phone] Fax number [phone]

## 2014-06-18 NOTE — Telephone Encounter (Signed)
Faxed.[facility]

## 2014-06-29 ENCOUNTER — Telehealth: Payer: Self-pay | Admitting: Family Medicine

## 2014-06-29 NOTE — Telephone Encounter (Signed)
Notes faxed.

## 2014-06-29 NOTE — Telephone Encounter (Signed)
Spoke to [place] requesting at least 3 clinical visit notes and any labs or testing recently done.

## 2014-07-02 DIAGNOSIS — M5416 Radiculopathy, lumbar region: Secondary | ICD-10-CM | POA: Diagnosis not present

## 2014-07-02 DIAGNOSIS — C61 Malignant neoplasm of prostate: Secondary | ICD-10-CM | POA: Diagnosis not present

## 2014-07-02 DIAGNOSIS — M545 Low back pain: Secondary | ICD-10-CM | POA: Diagnosis not present

## 2014-07-02 DIAGNOSIS — Z79899 Other long term (current) drug therapy: Secondary | ICD-10-CM | POA: Diagnosis not present

## 2014-07-02 DIAGNOSIS — E668 Other obesity: Secondary | ICD-10-CM | POA: Diagnosis not present

## 2014-07-02 DIAGNOSIS — M542 Cervicalgia: Secondary | ICD-10-CM | POA: Diagnosis not present

## 2014-07-02 DIAGNOSIS — F419 Anxiety disorder, unspecified: Secondary | ICD-10-CM | POA: Diagnosis not present

## 2014-07-02 DIAGNOSIS — F329 Major depressive disorder, single episode, unspecified: Secondary | ICD-10-CM | POA: Diagnosis not present

## 2014-07-02 DIAGNOSIS — C801 Malignant (primary) neoplasm, unspecified: Secondary | ICD-10-CM | POA: Diagnosis not present

## 2014-07-02 DIAGNOSIS — G8929 Other chronic pain: Secondary | ICD-10-CM | POA: Diagnosis not present

## 2014-07-02 DIAGNOSIS — Z87891 Personal history of nicotine dependence: Secondary | ICD-10-CM | POA: Diagnosis not present

## 2014-07-02 DIAGNOSIS — E669 Obesity, unspecified: Secondary | ICD-10-CM | POA: Diagnosis not present

## 2014-07-14 ENCOUNTER — Ambulatory Visit (INDEPENDENT_AMBULATORY_CARE_PROVIDER_SITE_OTHER): Payer: 59 | Admitting: Family Medicine

## 2014-07-14 ENCOUNTER — Encounter: Payer: Self-pay | Admitting: Family Medicine

## 2014-07-14 VITALS — BP 170/80 | HR 73 | Temp 98.8°F | Resp 16 | Wt 220.0 lb

## 2014-07-14 DIAGNOSIS — I10 Essential (primary) hypertension: Secondary | ICD-10-CM

## 2014-07-14 MED ORDER — METOPROLOL TARTRATE 50 MG PO TABS: 50.0000 mg | ORAL_TABLET | Freq: Two times a day (BID) | ORAL | Status: DC

## 2014-07-14 MED ORDER — LOSARTAN POTASSIUM 100 MG PO TABS: ORAL_TABLET | ORAL | Status: DC

## 2014-07-14 NOTE — Progress Notes (Signed)
Name: Robert Short   MRN: [id number]    DOB: 16-Jan-1950   Date:07/14/2014       Progress Note  Subjective  Chief Complaint  Chief Complaint  Patient presents with  . Hypertension    1 month follow up    HPI  For f/u of HBP.  Taking all meds.  Going to Pain Management and getting Psych eval soon.  Past Medical History  Diagnosis Date  . Hypertension   . Depression   . ASCVD (arteriosclerotic cardiovascular disease)   . Back pain   . Joint pain   . Prostate cancer   . Bone cancer   . History of kidney stones     History  Substance Use Topics  . Smoking status: Current Every Day Smoker -- 0.25 packs/day for 40 years    Types: Cigarettes  . Smokeless tobacco: Not on file  . Alcohol Use: 0.6 oz/week    1 Cans of beer per week     Comment: daily     Current outpatient prescriptions:  .  acetaminophen (TYLENOL) 500 MG tablet, Take by mouth., Disp: , Rfl:  .  amLODipine (NORVASC) 5 MG tablet, Take 1 tablet (5 mg total) by mouth daily., Disp: 30 tablet, Rfl: 12 .  aspirin 81 MG tablet, Take 81 mg by mouth daily., Disp: , Rfl:  .  DULoxetine (CYMBALTA) 30 MG capsule, Take 1 capsule (30 mg total) by mouth daily., Disp: 30 capsule, Rfl: 12 .  fluticasone (FLONASE) 50 MCG/ACT nasal spray, Place into the nose., Disp: , Rfl:  .  losartan (COZAAR) 100 MG tablet, Take 1 tablet (100 mg total) by mouth daily., Disp: 90 tablet, Rfl: 3 .  metoprolol succinate (TOPROL-XL) 50 MG 24 hr tablet, Take 1 tablet (50 mg total) by mouth daily. Take with or immediately following a meal., Disp: 30 tablet, Rfl: 12 .  baclofen (LIORESAL) 20 MG tablet, Take 1 tablet (20 mg total) by mouth 2 (two) times daily. (Patient not taking: Reported on 07/14/2014), Disp: 60 each, Rfl: 12 .  etodolac (LODINE) 500 MG tablet, Take by mouth., Disp: , Rfl:  .  ibuprofen (ADVIL,MOTRIN) 200 MG tablet, Take 200 mg by mouth every 6 (six) hours as needed for pain., Disp: , Rfl:  .  pregabalin (LYRICA) 75 MG capsule, Take by  mouth., Disp: , Rfl:   No Known Allergies  Review of Systems  Constitutional: Negative.  Negative for fever and chills.  HENT: Negative.   Eyes: Negative.  Negative for blurred vision and double vision.  Respiratory: Negative.  Negative for cough, sputum production, shortness of breath and wheezing.   Cardiovascular: Negative.  Negative for chest pain, palpitations, orthopnea, claudication and leg swelling.  Gastrointestinal: Negative.  Negative for heartburn, nausea, vomiting, abdominal pain, diarrhea and blood in stool.  Genitourinary: Negative.  Negative for dysuria, urgency and frequency.  Musculoskeletal: Positive for back pain (R. low back pain with R.  sciatica.).  Skin: Negative.  Negative for rash.  Neurological: Negative for headaches.      Objective  Filed Vitals:   07/14/14 1558  BP: 174/95  Pulse: 73  Temp: 98.8 F (37.1 C)  TempSrc: Oral  Resp: 16  Weight: 220 lb (99.791 kg)     Physical Exam  Constitutional: He is well-developed, well-nourished, and in no distress. No distress.  HENT:  Head: Normocephalic and atraumatic.  Eyes: Conjunctivae and EOM are normal. Pupils are equal, round, and reactive to light.  Neck: Normal range of motion.  Neck supple. No thyromegaly present.  Cardiovascular: Normal rate, regular rhythm, normal heart sounds and intact distal pulses.  Exam reveals no gallop and no friction rub.   No murmur heard. Pulmonary/Chest: Effort normal and breath sounds normal. No respiratory distress. He has no wheezes. He has no rales.  Abdominal: Soft. Bowel sounds are normal. He exhibits no mass. There is no tenderness.  Musculoskeletal: He exhibits no edema.  Lymphadenopathy:    He has no cervical adenopathy.  Vitals reviewed.        Assessment & Plan  1. Essential (primary) hypertension  - metoprolol (LOPRESSOR) 50 MG tablet; Take 1 tablet (50 mg total) by mouth 2 (two) times daily.  Dispense: 180 tablet; Refill: 3 - losartan  (COZAAR) 100 MG tablet; Take 1/2 tablet twice a day.  Dispense: 90 tablet; Refill: 3 -=continue Amlodipine, 5 mg daily.

## 2014-07-14 NOTE — Patient Instructions (Signed)
Continue all other current meds.  Keep f/u with pain management.

## 2014-07-15 DIAGNOSIS — M545 Low back pain: Secondary | ICD-10-CM | POA: Diagnosis not present

## 2014-07-15 DIAGNOSIS — M5417 Radiculopathy, lumbosacral region: Secondary | ICD-10-CM | POA: Diagnosis not present

## 2014-07-15 DIAGNOSIS — Z87891 Personal history of nicotine dependence: Secondary | ICD-10-CM | POA: Diagnosis not present

## 2014-07-15 DIAGNOSIS — Z79899 Other long term (current) drug therapy: Secondary | ICD-10-CM | POA: Diagnosis not present

## 2014-07-15 DIAGNOSIS — C801 Malignant (primary) neoplasm, unspecified: Secondary | ICD-10-CM | POA: Diagnosis not present

## 2014-07-15 DIAGNOSIS — M5410 Radiculopathy, site unspecified: Secondary | ICD-10-CM | POA: Diagnosis not present

## 2014-07-15 DIAGNOSIS — M5416 Radiculopathy, lumbar region: Secondary | ICD-10-CM | POA: Diagnosis not present

## 2014-07-15 DIAGNOSIS — M542 Cervicalgia: Secondary | ICD-10-CM | POA: Diagnosis not present

## 2014-07-15 DIAGNOSIS — M5126 Other intervertebral disc displacement, lumbar region: Secondary | ICD-10-CM | POA: Diagnosis not present

## 2014-07-15 DIAGNOSIS — G8929 Other chronic pain: Secondary | ICD-10-CM | POA: Diagnosis not present

## 2014-07-15 DIAGNOSIS — E668 Other obesity: Secondary | ICD-10-CM | POA: Diagnosis not present

## 2014-07-15 DIAGNOSIS — E669 Obesity, unspecified: Secondary | ICD-10-CM | POA: Diagnosis not present

## 2014-08-10 DIAGNOSIS — M5126 Other intervertebral disc displacement, lumbar region: Secondary | ICD-10-CM | POA: Diagnosis not present

## 2014-08-10 DIAGNOSIS — M5410 Radiculopathy, site unspecified: Secondary | ICD-10-CM | POA: Diagnosis not present

## 2014-08-10 DIAGNOSIS — M5417 Radiculopathy, lumbosacral region: Secondary | ICD-10-CM | POA: Diagnosis not present

## 2014-08-10 DIAGNOSIS — M5416 Radiculopathy, lumbar region: Secondary | ICD-10-CM | POA: Diagnosis not present

## 2014-08-27 ENCOUNTER — Ambulatory Visit (INDEPENDENT_AMBULATORY_CARE_PROVIDER_SITE_OTHER): Payer: 59 | Admitting: Family Medicine

## 2014-08-27 ENCOUNTER — Encounter: Payer: Self-pay | Admitting: Family Medicine

## 2014-08-27 VITALS — BP 134/76 | HR 80 | Temp 97.7°F | Resp 16 | Ht 71.0 in | Wt 201.0 lb

## 2014-08-27 DIAGNOSIS — M542 Cervicalgia: Secondary | ICD-10-CM | POA: Diagnosis not present

## 2014-08-27 DIAGNOSIS — M5441 Lumbago with sciatica, right side: Secondary | ICD-10-CM | POA: Diagnosis not present

## 2014-08-27 DIAGNOSIS — I10 Essential (primary) hypertension: Secondary | ICD-10-CM

## 2014-08-27 DIAGNOSIS — S0990XA Unspecified injury of head, initial encounter: Secondary | ICD-10-CM | POA: Diagnosis not present

## 2014-08-27 DIAGNOSIS — G8929 Other chronic pain: Secondary | ICD-10-CM | POA: Diagnosis not present

## 2014-08-27 DIAGNOSIS — R5383 Other fatigue: Secondary | ICD-10-CM | POA: Insufficient documentation

## 2014-08-27 DIAGNOSIS — R5382 Chronic fatigue, unspecified: Secondary | ICD-10-CM | POA: Diagnosis not present

## 2014-08-27 DIAGNOSIS — G43909 Migraine, unspecified, not intractable, without status migrainosus: Secondary | ICD-10-CM | POA: Diagnosis not present

## 2014-08-27 DIAGNOSIS — R739 Hyperglycemia, unspecified: Secondary | ICD-10-CM

## 2014-08-27 MED ORDER — DULOXETINE HCL 30 MG PO CPEP: ORAL_CAPSULE | ORAL | Status: DC

## 2014-08-27 NOTE — Progress Notes (Signed)
Name: Robert Short   MRN: [id number]    DOB: [birth year]   Date:08/27/2014       Progress Note  Subjective  Chief Complaint  Chief Complaint  Patient presents with  . Follow-up    Pt states bp meds make him very tired. No other concerns.  . Hypertension    HPI   Here for f/u of HBP.  Has chronic pain in back.  Pain Management has started Opoids.  Still c/o extreme fatigue.  Taking Cymbalta 30 mg/d.  He has been losing weight (desired). No problem-specific assessment & plan notes found for this encounter.   Past Medical History  Diagnosis Date  . Hypertension   . Depression   . ASCVD (arteriosclerotic cardiovascular disease)   . Back pain   . Joint pain   . Prostate cancer   . Bone cancer   . History of kidney stones     Social History  Substance Use Topics  . Smoking status: Current Every Day Smoker -- 0.25 packs/day for 40 years    Types: Cigarettes  . Smokeless tobacco: Never Used  . Alcohol Use: 0.6 oz/week    1 Cans of beer per week     Comment: daily     Current outpatient prescriptions:  .  acetaminophen (TYLENOL) 500 MG tablet, Take by mouth., Disp: , Rfl:  .  amLODipine (NORVASC) 5 MG tablet, Take 1 tablet (5 mg total) by mouth daily., Disp: 30 tablet, Rfl: 12 .  aspirin 81 MG tablet, Take 81 mg by mouth daily., Disp: , Rfl:  .  baclofen (LIORESAL) 20 MG tablet, Take 1 tablet (20 mg total) by mouth 2 (two) times daily., Disp: 60 each, Rfl: 12 .  DULoxetine (CYMBALTA) 30 MG capsule, Take 1 capsule (30 mg total) by mouth daily., Disp: 30 capsule, Rfl: 12 .  fluticasone (FLONASE) 50 MCG/ACT nasal spray, Place into the nose., Disp: , Rfl:  .  ibuprofen (ADVIL,MOTRIN) 200 MG tablet, Take 200 mg by mouth every 6 (six) hours as needed for pain., Disp: , Rfl:  .  losartan (COZAAR) 100 MG tablet, Take 1/2 tablet twice a day., Disp: 90 tablet, Rfl: 3 .  metoprolol succinate (TOPROL-XL) 50 MG 24 hr tablet, Take 1 tablet (50 mg total) by mouth daily. Take with or  immediately following a meal., Disp: 30 tablet, Rfl: 12 .  oxycodone (OXY-IR) 5 MG capsule, Take 5 mg by mouth every 6 (six) hours as needed. From Dr. Sundra Aland- pain managemednt, Disp: , Rfl:   No Known Allergies  Review of Systems  Constitutional: Positive for weight loss (desired) and malaise/fatigue. Negative for fever and chills.  HENT: Negative for hearing loss.   Eyes: Negative for blurred vision and double vision.  Respiratory: Negative for cough, sputum production, shortness of breath and wheezing.   Cardiovascular: Negative for chest pain, palpitations, orthopnea and leg swelling.  Gastrointestinal: Negative for heartburn and diarrhea. Abdominal pain: chronic, R sided abd. pain.  Genitourinary: Negative for dysuria, urgency and frequency.  Musculoskeletal: Positive for myalgias, back pain and neck pain.  Skin: Negative for rash.  Neurological: Negative for dizziness, sensory change, focal weakness and headaches.  Psychiatric/Behavioral: Positive for depression.      Objective  Filed Vitals:   08/27/14 1031  BP: 134/76  Pulse: 80  Temp: 97.7 F (36.5 C)  TempSrc: Oral  Resp: 16  Height: 5\' 11"  (1.803 m)  Weight: 201 lb (91.173 kg)     Physical Exam  Constitutional: He is  well-developed, well-nourished, and in no distress. No distress.  HENT:  Head: Normocephalic and atraumatic.  Eyes: Conjunctivae and EOM are normal. Pupils are equal, round, and reactive to light. No scleral icterus.  Neck: Normal range of motion. Neck supple. Carotid bruit is not present. No thyromegaly present.  Cardiovascular: Normal rate, regular rhythm, normal heart sounds and intact distal pulses.  Exam reveals no gallop and no friction rub.   No murmur heard. Pulmonary/Chest: Effort normal and breath sounds normal. No respiratory distress. He has no wheezes. He has no rales.  Abdominal: Soft. Bowel sounds are normal. He exhibits no distension and no mass. There is no tenderness.   Musculoskeletal: He exhibits no edema.  Lymphadenopathy:    He has no cervical adenopathy.  Psychiatric:  Affect is sl. Depressed.  Vitals reviewed.     No results found for this or any previous visit (from the past 2160 hour(s)).   Assessment & Plan  1. Essential (primary) hypertension   2. Chronic cervical pain  - DULoxetine (CYMBALTA) 30 MG capsule; Take 2 capsules each AM.  Dispense: 60 capsule; Refill: 6  3. Midline low back pain with right-sided sciatica   4. Chronic fatigue  - CBC with Differential - Comprehensive Metabolic Panel (CMET) - TSH  5. Hyperglycemia  - HgB A1c

## 2014-08-27 NOTE — Patient Instructions (Signed)
Continue current meds.  Cont. To fol pain managment suggestions.  Take Cymbalta 30 mg., 1 alternating with 2 a day.

## 2014-09-29 ENCOUNTER — Other Ambulatory Visit
Admission: RE | Admit: 2014-09-29 | Discharge: 2014-09-29 | Disposition: A | Discharge status: Home / Self Care | Payer: 59 | Source: Ambulatory Visit | Attending: Family Medicine | Admitting: Family Medicine

## 2014-09-29 DIAGNOSIS — R5382 Chronic fatigue, unspecified: Secondary | ICD-10-CM | POA: Diagnosis not present

## 2014-09-29 LAB — CBC WITH DIFFERENTIAL/PLATELET
Basophils Absolute: 0.1 10*3/uL (ref 0–0.1)
Basophils Relative: 1 %
EOS ABS: 0.2 10*3/uL (ref 0–0.7)
EOS PCT: 3 %
HCT: 48.2 % (ref 40.0–52.0)
Hemoglobin: 16.2 g/dL (ref 13.0–18.0)
LYMPHS ABS: 2.1 10*3/uL (ref 1.0–3.6)
Lymphocytes Relative: 30 %
MCH: 30.9 pg (ref 26.0–34.0)
MCHC: 33.6 g/dL (ref 32.0–36.0)
MCV: 92.1 fL (ref 80.0–100.0)
MONOS PCT: 8 %
Monocytes Absolute: 0.5 10*3/uL (ref 0.2–1.0)
Neutro Abs: 4.1 10*3/uL (ref 1.4–6.5)
Neutrophils Relative %: 58 %
PLATELETS: 149 10*3/uL — AB (ref 150–440)
RBC: 5.23 MIL/uL (ref 4.40–5.90)
RDW: 13.6 % (ref 11.5–14.5)
WBC: 7 10*3/uL (ref 3.8–10.6)

## 2014-09-29 LAB — COMPREHENSIVE METABOLIC PANEL
ALK PHOS: 94 U/L (ref 38–126)
ALT: 17 U/L (ref 17–63)
ANION GAP: 10 (ref 5–15)
AST: 17 U/L (ref 15–41)
Albumin: 4 g/dL (ref 3.5–5.0)
BUN: 13 mg/dL (ref 6–20)
CALCIUM: 9.5 mg/dL (ref 8.9–10.3)
CO2: 27 mmol/L (ref 22–32)
Chloride: 103 mmol/L (ref 101–111)
Creatinine, Ser: 0.88 mg/dL (ref 0.61–1.24)
Glucose, Bld: 124 mg/dL — ABNORMAL HIGH (ref 65–99)
Potassium: 4.7 mmol/L (ref 3.5–5.1)
SODIUM: 140 mmol/L (ref 135–145)
Total Bilirubin: 0.6 mg/dL (ref 0.3–1.2)
Total Protein: 6.7 g/dL (ref 6.5–8.1)

## 2014-09-29 LAB — TSH: TSH: 1.112 u[IU]/mL (ref 0.350–4.500)

## 2014-09-30 ENCOUNTER — Ambulatory Visit (INDEPENDENT_AMBULATORY_CARE_PROVIDER_SITE_OTHER): Payer: 59 | Admitting: Family Medicine

## 2014-09-30 ENCOUNTER — Encounter: Payer: Self-pay | Admitting: Family Medicine

## 2014-09-30 VITALS — BP 140/80 | HR 71 | Temp 98.2°F | Resp 16 | Ht 71.0 in | Wt 202.0 lb

## 2014-09-30 DIAGNOSIS — M542 Cervicalgia: Secondary | ICD-10-CM | POA: Diagnosis not present

## 2014-09-30 DIAGNOSIS — G629 Polyneuropathy, unspecified: Secondary | ICD-10-CM | POA: Diagnosis not present

## 2014-09-30 DIAGNOSIS — G8929 Other chronic pain: Secondary | ICD-10-CM | POA: Diagnosis not present

## 2014-09-30 DIAGNOSIS — I10 Essential (primary) hypertension: Secondary | ICD-10-CM | POA: Diagnosis not present

## 2014-09-30 DIAGNOSIS — M5441 Lumbago with sciatica, right side: Secondary | ICD-10-CM

## 2014-09-30 DIAGNOSIS — R5382 Chronic fatigue, unspecified: Secondary | ICD-10-CM | POA: Diagnosis not present

## 2014-09-30 DIAGNOSIS — Z23 Encounter for immunization: Secondary | ICD-10-CM | POA: Diagnosis not present

## 2014-09-30 DIAGNOSIS — I2510 Atherosclerotic heart disease of native coronary artery without angina pectoris: Secondary | ICD-10-CM

## 2014-09-30 LAB — HEMOGLOBIN A1C: Hgb A1c MFr Bld: 6 % (ref 4.0–6.0)

## 2014-09-30 MED ORDER — DULOXETINE HCL 30 MG PO CPEP
ORAL_CAPSULE | ORAL | Status: DC
Start: 2014-09-30 — End: 2015-01-18

## 2014-09-30 NOTE — Progress Notes (Signed)
Appt today

## 2014-09-30 NOTE — Progress Notes (Signed)
appt today

## 2014-09-30 NOTE — Progress Notes (Signed)
Name: Robert Short   MRN: [id number]    DOB: 01/02/1951   Date:09/30/2014       Progress Note  Subjective  Chief Complaint  Chief Complaint  Patient presents with  . Hypertension    1 month    HPI  Here for f/u of HBP.  Also has chronic pain.  Cymbalta has not seemed to help with the pain of fatigue. No problem-specific assessment & plan notes found for this encounter.   Past Medical History  Diagnosis Date  . Hypertension   . Depression   . ASCVD (arteriosclerotic cardiovascular disease)   . Back pain   . Joint pain   . Prostate cancer   . Bone cancer   . History of kidney stones     Social History  Substance Use Topics  . Smoking status: Current Every Day Smoker -- 0.25 packs/day for 40 years    Types: Cigarettes  . Smokeless tobacco: Never Used  . Alcohol Use: 0.6 oz/week    1 Cans of beer per week     Comment: daily     Current outpatient prescriptions:  .  acetaminophen (TYLENOL) 500 MG tablet, Take by mouth., Disp: , Rfl:  .  amLODipine (NORVASC) 5 MG tablet, Take 1 tablet (5 mg total) by mouth daily., Disp: 30 tablet, Rfl: 12 .  aspirin 81 MG tablet, Take 81 mg by mouth daily., Disp: , Rfl:  .  baclofen (LIORESAL) 20 MG tablet, Take 1 tablet (20 mg total) by mouth 2 (two) times daily., Disp: 60 each, Rfl: 12 .  DULoxetine (CYMBALTA) 30 MG capsule, Take 2 capsules each AM., Disp: 60 capsule, Rfl: 6 .  fluticasone (FLONASE) 50 MCG/ACT nasal spray, Place into the nose., Disp: , Rfl:  .  ibuprofen (ADVIL,MOTRIN) 200 MG tablet, Take 200 mg by mouth every 6 (six) hours as needed for pain., Disp: , Rfl:  .  losartan (COZAAR) 100 MG tablet, Take 1/2 tablet twice a day., Disp: 90 tablet, Rfl: 3 .  metoprolol succinate (TOPROL-XL) 50 MG 24 hr tablet, Take 1 tablet (50 mg total) by mouth daily. Take with or immediately following a meal., Disp: 30 tablet, Rfl: 12 .  oxyCODONE (OXY IR/ROXICODONE) 5 MG immediate release tablet, , Disp: , Rfl: 0 .  oxycodone (OXY-IR) 5 MG  capsule, Take 5 mg by mouth every 6 (six) hours as needed. From Dr. Sundra Aland- pain managemednt, Disp: , Rfl:   No Known Allergies  Review of Systems  Constitutional: Positive for malaise/fatigue. Negative for fever, chills and weight loss.  HENT: Negative for hearing loss.   Eyes: Negative for blurred vision, double vision and photophobia.  Respiratory: Negative for cough, sputum production, shortness of breath and wheezing.   Cardiovascular: Negative for chest pain, palpitations, orthopnea and leg swelling.  Gastrointestinal: Positive for nausea. Negative for heartburn, vomiting, abdominal pain, diarrhea and blood in stool.  Genitourinary: Negative for dysuria, urgency and frequency.  Musculoskeletal: Positive for back pain and neck pain.       Cont. Flank pain and abd. Pain.  Neurological: Negative for weakness and headaches.  Psychiatric/Behavioral: The patient has insomnia. The patient is not nervous/anxious.       Objective  Filed Vitals:   09/30/14 1039  BP: 179/108  Pulse: 71  Temp: 98.2 F (36.8 C)  TempSrc: Oral  Resp: 16  Height: '5\' 11"'  (1.803 m)  Weight: 202 lb (91.627 kg)     Physical Exam  Constitutional: He is oriented to person, place,  and time and well-developed, well-nourished, and in no distress. No distress.  HENT:  Head: Normocephalic and atraumatic.  Eyes: Conjunctivae and EOM are normal. Pupils are equal, round, and reactive to light. No scleral icterus.  Neck: Normal range of motion. Neck supple. No thyromegaly present.  Cardiovascular: Normal rate, regular rhythm, normal heart sounds and intact distal pulses.  Exam reveals no gallop and no friction rub.   No murmur heard. Pulmonary/Chest: Effort normal and breath sounds normal. No respiratory distress. He has no wheezes. He has no rales.  Abdominal: Soft. Bowel sounds are normal. He exhibits no distension. There is no tenderness. There is no rebound.  Musculoskeletal: Normal range of motion. He  exhibits no edema.  Lymphadenopathy:    He has no cervical adenopathy.  Neurological: He is alert and oriented to person, place, and time.  Vitals reviewed.     Recent Results (from the past 2160 hour(s))  CBC with Differential/Platelet     Status: Abnormal   Collection Time: 09/29/14  1:52 PM  Result Value Ref Range   WBC 7.0 3.8 - 10.6 K/uL   RBC 5.23 4.40 - 5.90 MIL/uL   Hemoglobin 16.2 13.0 - 18.0 g/dL   HCT 48.2 40.0 - 52.0 %   MCV 92.1 80.0 - 100.0 fL   MCH 30.9 26.0 - 34.0 pg   MCHC 33.6 32.0 - 36.0 g/dL   RDW 13.6 11.5 - 14.5 %   Platelets 149 (L) 150 - 440 K/uL   Neutrophils Relative % 58 %   Neutro Abs 4.1 1.4 - 6.5 K/uL   Lymphocytes Relative 30 %   Lymphs Abs 2.1 1.0 - 3.6 K/uL   Monocytes Relative 8 %   Monocytes Absolute 0.5 0.2 - 1.0 K/uL   Eosinophils Relative 3 %   Eosinophils Absolute 0.2 0 - 0.7 K/uL   Basophils Relative 1 %   Basophils Absolute 0.1 0 - 0.1 K/uL  Comprehensive metabolic panel     Status: Abnormal   Collection Time: 09/29/14  1:52 PM  Result Value Ref Range   Sodium 140 135 - 145 mmol/L   Potassium 4.7 3.5 - 5.1 mmol/L   Chloride 103 101 - 111 mmol/L   CO2 27 22 - 32 mmol/L   Glucose, Bld 124 (H) 65 - 99 mg/dL   BUN 13 6 - 20 mg/dL   Creatinine, Ser 0.88 0.61 - 1.24 mg/dL   Calcium 9.5 8.9 - 10.3 mg/dL   Total Protein 6.7 6.5 - 8.1 g/dL   Albumin 4.0 3.5 - 5.0 g/dL   AST 17 15 - 41 U/L   ALT 17 17 - 63 U/L   Alkaline Phosphatase 94 38 - 126 U/L   Total Bilirubin 0.6 0.3 - 1.2 mg/dL   GFR calc non Af Amer >60 >60 mL/min   GFR calc Af Amer >60 >60 mL/min    Comment: (NOTE) The eGFR has been calculated using the CKD EPI equation. This calculation has not been validated in all clinical situations. eGFR's persistently <60 mL/min signify possible Chronic Kidney Disease.    Anion gap 10 5 - 15  TSH     Status: None   Collection Time: 09/29/14  1:52 PM  Result Value Ref Range   TSH 1.112 0.350 - 4.500 uIU/mL  Hemoglobin A1c      Status: None   Collection Time: 09/29/14  1:52 PM  Result Value Ref Range   Hgb A1c MFr Bld 6.0 4.0 - 6.0 %  Assessment & Plan  1. Need for influenza vaccination  - Flu Vaccine QUAD 36+ mos PF IM (Fluarix & Fluzone Quad PF)  2. Chronic cervical pain  - DULoxetine (CYMBALTA) 30 MG capsule; Take 3 capsules each AM.  Dispense: 90 capsule; Refill: 6  3. Essential (primary) hypertension   4. Coronary artery disease involving native coronary artery of native heart without angina pectoris   5. Neuropathy   6. Midline low back pain with right-sided sciatica   7. Chronic fatigue  - DULoxetine (CYMBALTA) 30 MG capsule; Take 3 capsules each AM.  Dispense: 90 capsule; Refill: 6

## 2014-12-01 ENCOUNTER — Ambulatory Visit: Payer: 59 | Admitting: Family Medicine

## 2014-12-07 ENCOUNTER — Ambulatory Visit: Payer: 59 | Attending: Pain Medicine | Admitting: Pain Medicine

## 2014-12-07 ENCOUNTER — Encounter: Payer: Self-pay | Admitting: Family Medicine

## 2014-12-07 ENCOUNTER — Encounter: Payer: Self-pay | Admitting: Pain Medicine

## 2014-12-07 ENCOUNTER — Other Ambulatory Visit: Payer: Self-pay | Admitting: Pain Medicine

## 2014-12-07 ENCOUNTER — Ambulatory Visit (INDEPENDENT_AMBULATORY_CARE_PROVIDER_SITE_OTHER): Payer: 59 | Admitting: Family Medicine

## 2014-12-07 VITALS — BP 150/80 | HR 78 | Resp 16 | Ht 71.0 in | Wt 205.4 lb

## 2014-12-07 VITALS — BP 169/92 | HR 85 | Temp 98.4°F | Resp 18 | Ht 71.0 in | Wt 203.0 lb

## 2014-12-07 DIAGNOSIS — F17210 Nicotine dependence, cigarettes, uncomplicated: Secondary | ICD-10-CM | POA: Diagnosis not present

## 2014-12-07 DIAGNOSIS — M4726 Other spondylosis with radiculopathy, lumbar region: Secondary | ICD-10-CM

## 2014-12-07 DIAGNOSIS — I2510 Atherosclerotic heart disease of native coronary artery without angina pectoris: Secondary | ICD-10-CM | POA: Insufficient documentation

## 2014-12-07 DIAGNOSIS — Z79891 Long term (current) use of opiate analgesic: Secondary | ICD-10-CM

## 2014-12-07 DIAGNOSIS — M542 Cervicalgia: Secondary | ICD-10-CM | POA: Insufficient documentation

## 2014-12-07 DIAGNOSIS — Z5181 Encounter for therapeutic drug level monitoring: Secondary | ICD-10-CM | POA: Insufficient documentation

## 2014-12-07 DIAGNOSIS — M47892 Other spondylosis, cervical region: Secondary | ICD-10-CM | POA: Diagnosis not present

## 2014-12-07 DIAGNOSIS — F329 Major depressive disorder, single episode, unspecified: Secondary | ICD-10-CM | POA: Insufficient documentation

## 2014-12-07 DIAGNOSIS — M545 Low back pain, unspecified: Secondary | ICD-10-CM

## 2014-12-07 DIAGNOSIS — M549 Dorsalgia, unspecified: Secondary | ICD-10-CM | POA: Insufficient documentation

## 2014-12-07 DIAGNOSIS — Z7189 Other specified counseling: Secondary | ICD-10-CM

## 2014-12-07 DIAGNOSIS — G893 Neoplasm related pain (acute) (chronic): Secondary | ICD-10-CM | POA: Insufficient documentation

## 2014-12-07 DIAGNOSIS — E785 Hyperlipidemia, unspecified: Secondary | ICD-10-CM | POA: Insufficient documentation

## 2014-12-07 DIAGNOSIS — F1190 Opioid use, unspecified, uncomplicated: Secondary | ICD-10-CM

## 2014-12-07 DIAGNOSIS — C61 Malignant neoplasm of prostate: Secondary | ICD-10-CM | POA: Insufficient documentation

## 2014-12-07 DIAGNOSIS — K5903 Drug induced constipation: Secondary | ICD-10-CM | POA: Diagnosis not present

## 2014-12-07 DIAGNOSIS — M25569 Pain in unspecified knee: Secondary | ICD-10-CM | POA: Insufficient documentation

## 2014-12-07 DIAGNOSIS — M47896 Other spondylosis, lumbar region: Secondary | ICD-10-CM | POA: Insufficient documentation

## 2014-12-07 DIAGNOSIS — M47812 Spondylosis without myelopathy or radiculopathy, cervical region: Secondary | ICD-10-CM

## 2014-12-07 DIAGNOSIS — R5382 Chronic fatigue, unspecified: Secondary | ICD-10-CM

## 2014-12-07 DIAGNOSIS — G8929 Other chronic pain: Secondary | ICD-10-CM

## 2014-12-07 DIAGNOSIS — M25519 Pain in unspecified shoulder: Secondary | ICD-10-CM

## 2014-12-07 DIAGNOSIS — M792 Neuralgia and neuritis, unspecified: Secondary | ICD-10-CM

## 2014-12-07 DIAGNOSIS — I10 Essential (primary) hypertension: Secondary | ICD-10-CM | POA: Insufficient documentation

## 2014-12-07 DIAGNOSIS — M7918 Myalgia, other site: Secondary | ICD-10-CM

## 2014-12-07 DIAGNOSIS — F32A Depression, unspecified: Secondary | ICD-10-CM

## 2014-12-07 DIAGNOSIS — T402X5A Adverse effect of other opioids, initial encounter: Secondary | ICD-10-CM

## 2014-12-07 DIAGNOSIS — M791 Myalgia: Secondary | ICD-10-CM

## 2014-12-07 DIAGNOSIS — C7951 Secondary malignant neoplasm of bone: Secondary | ICD-10-CM | POA: Insufficient documentation

## 2014-12-07 DIAGNOSIS — D8989 Other specified disorders involving the immune mechanism, not elsewhere classified: Secondary | ICD-10-CM

## 2014-12-07 DIAGNOSIS — Z79899 Other long term (current) drug therapy: Secondary | ICD-10-CM | POA: Diagnosis not present

## 2014-12-07 DIAGNOSIS — G9332 Myalgic encephalomyelitis/chronic fatigue syndrome: Secondary | ICD-10-CM

## 2014-12-07 MED ORDER — OXYCODONE HCL 10 MG PO TABS: 10.0000 mg | ORAL_TABLET | Freq: Every day | ORAL | Status: DC | PRN

## 2014-12-07 MED ORDER — OXYCODONE HCL 5 MG PO TABS: 5.0000 mg | ORAL_TABLET | Freq: Four times a day (QID) | ORAL | Status: DC | PRN

## 2014-12-07 MED ORDER — BENEFIBER PO POWD: ORAL | Status: DC

## 2014-12-07 MED ORDER — LUBIPROSTONE 24 MCG PO CAPS: 24.0000 ug | ORAL_CAPSULE | Freq: Two times a day (BID) | ORAL | Status: DC

## 2014-12-07 MED ORDER — OXYCODONE HCL 5 MG PO CAPS: 5.0000 mg | ORAL_CAPSULE | Freq: Four times a day (QID) | ORAL | Status: DC | PRN

## 2014-12-07 NOTE — Progress Notes (Signed)
Safety precautions to be maintained throughout the outpatient stay will include: orient to surroundings, keep bed in low position, maintain call bell within reach at all times, provide assistance with transfer out of bed and ambulation. Oxycodone pill count # 0 

## 2014-12-07 NOTE — Progress Notes (Signed)
Name: Robert Short   MRN: [id number]    DOB: 1950/05/28   Date:12/07/2014       Progress Note  Subjective  Chief Complaint  Chief Complaint  Patient presents with  . Hypertension    Hypertension Pertinent negatives include no blurred vision, chest pain, headaches, malaise/fatigue, palpitations or shortness of breath.   Here for f/u for HBP.  Have had a lot of back pain for past several weeks and this has caused him to miss a lot of sleep.  Sees Dr. Daleen Squibb for Pain Management.  He reports that BPs at home before pain lack of control was 140/80.  No problem-specific assessment & plan notes found for this encounter.   Past Medical History  Diagnosis Date  . Hypertension   . Depression   . ASCVD (arteriosclerotic cardiovascular disease)   . Back pain   . Joint pain   . Prostate cancer ([place])   . Bone cancer ([place])   . History of kidney stones     Past Surgical History  Procedure Laterality Date  . Cardiac catheterization      [facility]  . Wrist surgery    . Tonsillectomy    . Cervix surgery    . Prostatectomy    . Spine surgery      Family History  Problem Relation Age of Onset  . Heart attack Mother   . Hypertension Mother   . Heart attack Father     Social History   Social History  . Marital Status: Married    Spouse Name: N/A  . Number of Children: N/A  . Years of Education: N/A   Occupational History  . Not on file.   Social History Main Topics  . Smoking status: Current Every Day Smoker -- 1.00 packs/day for 40 years    Types: Cigarettes  . Smokeless tobacco: Current User  . Alcohol Use: 0.6 oz/week    1 Cans of beer per week     Comment: daily  . Drug Use: No  . Sexual Activity: Not on file   Other Topics Concern  . Not on file   Social History Narrative     Current outpatient prescriptions:  .  acetaminophen (TYLENOL) 500 MG tablet, Take 1,500 mg by mouth 2 (two) times daily. , Disp: , Rfl:  .  amLODipine (NORVASC) 5 MG tablet, Take 1 tablet  (5 mg total) by mouth daily., Disp: 30 tablet, Rfl: 12 .  aspirin 81 MG tablet, Take 81 mg by mouth daily., Disp: , Rfl:  .  baclofen (LIORESAL) 20 MG tablet, Take 1 tablet (20 mg total) by mouth 2 (two) times daily., Disp: 60 each, Rfl: 12 .  DULoxetine (CYMBALTA) 30 MG capsule, Take 3 capsules each AM. (Patient taking differently: 30 mg daily. Take 3 capsules each AM.), Disp: 90 capsule, Rfl: 6 .  ibuprofen (ADVIL,MOTRIN) 200 MG tablet, Take 800 mg by mouth every 8 (eight) hours as needed. , Disp: , Rfl:  .  losartan (COZAAR) 100 MG tablet, Take 1/2 tablet twice a day., Disp: 90 tablet, Rfl: 3 .  lubiprostone (AMITIZA) 24 MCG capsule, Take 1 capsule (24 mcg total) by mouth 2 (two) times daily with a meal. Swallow the medication whole. Do not break or chew the medication., Disp: 60 capsule, Rfl: PRN .  metoprolol succinate (TOPROL-XL) 50 MG 24 hr tablet, Take 1 tablet (50 mg total) by mouth daily. Take with or immediately following a meal., Disp: 30 tablet, Rfl: 12 .  Oxycodone  HCl 10 MG TABS, Take 1 tablet (10 mg total) by mouth 5 (five) times daily as needed., Disp: 150 tablet, Rfl: 0 .  Wheat Dextrin (BENEFIBER) POWD, Stir 2 tsp. TID into 4-8 oz of any non-carbonated beverage or soft food (hot or cold), Disp: 500 g, Rfl: PRN  Not on File   Review of Systems  Constitutional: Negative for fever, chills, weight loss and malaise/fatigue.  HENT: Negative for hearing loss.   Eyes: Negative for blurred vision and double vision.  Respiratory: Negative for cough, shortness of breath and wheezing.   Cardiovascular: Negative for chest pain, palpitations and leg swelling.  Gastrointestinal: Negative for heartburn, abdominal pain and blood in stool.  Genitourinary: Negative for dysuria, urgency and frequency.  Musculoskeletal: Positive for back pain.  Skin: Negative for rash.  Neurological: Negative for dizziness, tremors, weakness and headaches.  Psychiatric/Behavioral: Positive for depression.       Objective  Filed Vitals:   12/07/14 1316 12/07/14 1339  BP: 168/91 150/80  Pulse: 78   Resp: 16   Height: 5' 11" (1.803 m)   Weight: 205 lb 6.4 oz (93.169 kg)     Physical Exam  Constitutional: He is oriented to person, place, and time and well-developed, well-nourished, and in no distress. No distress.  HENT:  Head: Normocephalic and atraumatic.  Eyes: Conjunctivae and EOM are normal. Pupils are equal, round, and reactive to light. No scleral icterus.  Neck: Normal range of motion. Neck supple. Carotid bruit is not present. No thyromegaly present.  Cardiovascular: Normal rate, regular rhythm, normal heart sounds and intact distal pulses.  Exam reveals no gallop and no friction rub.   No murmur heard. Pulmonary/Chest: Effort normal and breath sounds normal. No respiratory distress. He has no wheezes. He has no rales.  Abdominal: Soft. Bowel sounds are normal. He exhibits no distension and no mass. There is no tenderness.  Musculoskeletal: He exhibits no edema.  Lymphadenopathy:    He has no cervical adenopathy.  Neurological: He is alert and oriented to person, place, and time.  Vitals reviewed.      Recent Results (from the past 2160 hour(s))  CBC with Differential/Platelet     Status: Abnormal   Collection Time: 09/29/14  1:52 PM  Result Value Ref Range   WBC 7.0 3.8 - 10.6 K/uL   RBC 5.23 4.40 - 5.90 MIL/uL   Hemoglobin 16.2 13.0 - 18.0 g/dL   HCT 48.2 40.0 - 52.0 %   MCV 92.1 80.0 - 100.0 fL   MCH 30.9 26.0 - 34.0 pg   MCHC 33.6 32.0 - 36.0 g/dL   RDW 13.6 11.5 - 14.5 %   Platelets 149 (L) 150 - 440 K/uL   Neutrophils Relative % 58 %   Neutro Abs 4.1 1.4 - 6.5 K/uL   Lymphocytes Relative 30 %   Lymphs Abs 2.1 1.0 - 3.6 K/uL   Monocytes Relative 8 %   Monocytes Absolute 0.5 0.2 - 1.0 K/uL   Eosinophils Relative 3 %   Eosinophils Absolute 0.2 0 - 0.7 K/uL   Basophils Relative 1 %   Basophils Absolute 0.1 0 - 0.1 K/uL  Comprehensive metabolic panel      Status: Abnormal   Collection Time: 09/29/14  1:52 PM  Result Value Ref Range   Sodium 140 135 - 145 mmol/L   Potassium 4.7 3.5 - 5.1 mmol/L   Chloride 103 101 - 111 mmol/L   CO2 27 22 - 32 mmol/L   Glucose, Bld 124 (H)  65 - 99 mg/dL   BUN 13 6 - 20 mg/dL   Creatinine, Ser 0.88 0.61 - 1.24 mg/dL   Calcium 9.5 8.9 - 10.3 mg/dL   Total Protein 6.7 6.5 - 8.1 g/dL   Albumin 4.0 3.5 - 5.0 g/dL   AST 17 15 - 41 U/L   ALT 17 17 - 63 U/L   Alkaline Phosphatase 94 38 - 126 U/L   Total Bilirubin 0.6 0.3 - 1.2 mg/dL   GFR calc non Af Amer >60 >60 mL/min   GFR calc Af Amer >60 >60 mL/min    Comment: (NOTE) The eGFR has been calculated using the CKD EPI equation. This calculation has not been validated in all clinical situations. eGFR's persistently <60 mL/min signify possible Chronic Kidney Disease.    Anion gap 10 5 - 15  TSH     Status: None   Collection Time: 09/29/14  1:52 PM  Result Value Ref Range   TSH 1.112 0.350 - 4.500 uIU/mL  Hemoglobin A1c     Status: None   Collection Time: 09/29/14  1:52 PM  Result Value Ref Range   Hgb A1c MFr Bld 6.0 4.0 - 6.0 %     Assessment & Plan  Problem List Items Addressed This Visit    None      Meds ordered this encounter  Medications  . DISCONTD: oxyCODONE (OXY IR/ROXICODONE) 5 MG immediate release tablet    Sig:     Refill:  0   1. Essential (primary) hypertension -cont. meds at current doses 2. Coronary artery disease involving native coronary artery of native heart without angina pectoris   3. Chronic pain -cont. Pain Management appts.  4. Long term current use of opiate analgesic   5. HLD (hyperlipidemia)   6. CFIDS (chronic fatigue and immune dysfunction syndrome)   7. Clinical depression     

## 2014-12-07 NOTE — Patient Instructions (Signed)
Patient wishes to wait to see if pain management control will help BP before increasing any meds.

## 2014-12-08 ENCOUNTER — Encounter: Payer: Self-pay | Admitting: Pain Medicine

## 2014-12-08 DIAGNOSIS — C61 Malignant neoplasm of prostate: Secondary | ICD-10-CM | POA: Insufficient documentation

## 2014-12-08 DIAGNOSIS — M542 Cervicalgia: Secondary | ICD-10-CM

## 2014-12-08 DIAGNOSIS — M792 Neuralgia and neuritis, unspecified: Secondary | ICD-10-CM | POA: Insufficient documentation

## 2014-12-08 DIAGNOSIS — M545 Low back pain: Secondary | ICD-10-CM

## 2014-12-08 DIAGNOSIS — G893 Neoplasm related pain (acute) (chronic): Secondary | ICD-10-CM | POA: Insufficient documentation

## 2014-12-08 DIAGNOSIS — C7951 Secondary malignant neoplasm of bone: Secondary | ICD-10-CM

## 2014-12-08 DIAGNOSIS — M47816 Spondylosis without myelopathy or radiculopathy, lumbar region: Secondary | ICD-10-CM | POA: Insufficient documentation

## 2014-12-08 DIAGNOSIS — M47812 Spondylosis without myelopathy or radiculopathy, cervical region: Secondary | ICD-10-CM | POA: Insufficient documentation

## 2014-12-08 DIAGNOSIS — M25519 Pain in unspecified shoulder: Secondary | ICD-10-CM

## 2014-12-08 DIAGNOSIS — M7918 Myalgia, other site: Secondary | ICD-10-CM | POA: Insufficient documentation

## 2014-12-08 DIAGNOSIS — G8929 Other chronic pain: Secondary | ICD-10-CM | POA: Insufficient documentation

## 2014-12-08 NOTE — Progress Notes (Signed)
Patient's Name: Robert Short MRN: [id number] DOB: 23-Oct-1950 DOS: 12/07/2014  Primary Reason(s) for Visit: Encounter for Medication Management CC: Neck Pain; Back Pain; and Knee Pain   HPI:   Robert Short is a 64 y.o. year old, male patient, who returns today as an established patient. He has SOB (shortness of breath); CAD (coronary artery disease); Unable to ambulate; Smoker; CFIDS (chronic fatigue and immune dysfunction syndrome); Cervical spinal stenosis; Clinical depression; ED (erectile dysfunction) of organic origin; Essential (primary) hypertension; Acid reflux; HLD (hyperlipidemia); Amnesia; Chronic cervical pain; Neuropathy ([place]); Loss of feeling or sensation; Blood glucose elevated; Prostate cancer; Restless leg; Pain in shoulder; Compulsive tobacco user syndrome; Fatigue; Chronic pain; Long term current use of opiate analgesic; Long term prescription opiate use; Opiate use; Encounter for therapeutic drug level monitoring; Encounter for chronic pain management; Malignant neoplasm of prostate ([place]); Therapeutic opioid-induced constipation (OIC); Chronic neck pain; Cervical spondylosis; Chronic low back pain; Lumbar spondylosis; Chronic shoulder pain; Prostate cancer metastatic to bone [facility]); Cancer-related pain; Neurogenic pain; and Musculoskeletal pain on his problem list.. His primarily concern today is the Neck Pain; Back Pain; and Knee Pain     The patient returns to the clinic today for pharmacological evaluation and management. Unfortunately, he has metastatic bone cancer secondary to his prostate. Today we will adjust his medications to compensate and provide him with some palliative care.  Today's Pain Score: 8 , clinically he looks like a fight 6/10. Reported level of pain is incompatible with clinical obrservations. This may be secondary to a possible lack of understanding on how the pain scale works. Pain Type: Chronic pain Pain Location: Back Pain Orientation: Lower Pain Descriptors  / Indicators: Aching, Constant, Burning, Dull Pain Frequency: Constant  Date of Last Visit:  (unknown)    Pharmacotherapy Review:   Side-effects or Adverse reactions: None reported. Effectiveness: Described as ineffective and would like to make some changes. Onset of action: Within expected pharmacological parameters. Duration of action: Within normal limits for medication. Peak effect: Timing and results are as within normal expected parameters. [place] PMP: Compliant with practice rules and regulations. UDS Results: None available at this time. UDS Interpretation: Patient appears to be compliant with practice rules and regulations Medication Assessment Form: Reviewed. Patient indicates being compliant with therapy Treatment compliance: Compliant Substance Use Disorder (SUD) Risk Level: Low Pharmacologic Plan: Today I will change the patient's dose to 10 mg and shorten the time interval between doses. The patient was reminded that he does not need to take both pills at once and if he feels that 10 mg is too much he can hold the next dose or increase the interval between doses. Understood and accepted.  Last Available Lab Work: Hospital Outpatient Visit on 09/29/2014  Component Date Value Ref Range Status  . WBC 09/29/2014 7.0  3.8 - 10.6 K/uL Final  . RBC 09/29/2014 5.23  4.40 - 5.90 MIL/uL Final  . Hemoglobin 09/29/2014 16.2  13.0 - 18.0 g/dL Final  . HCT 09/29/2014 48.2  40.0 - 52.0 % Final  . MCV 09/29/2014 92.1  80.0 - 100.0 fL Final  . MCH 09/29/2014 30.9  26.0 - 34.0 pg Final  . MCHC 09/29/2014 33.6  32.0 - 36.0 g/dL Final  . RDW 09/29/2014 13.6  11.5 - 14.5 % Final  . Platelets 09/29/2014 149* 150 - 440 K/uL Final  . Neutrophils Relative % 09/29/2014 58   Final  . Neutro Abs 09/29/2014 4.1  1.4 - 6.5 K/uL Final  . Lymphocytes Relative  09/29/2014 30   Final  . Lymphs Abs 09/29/2014 2.1  1.0 - 3.6 K/uL Final  . Monocytes Relative 09/29/2014 8   Final  . Monocytes Absolute  09/29/2014 0.5  0.2 - 1.0 K/uL Final  . Eosinophils Relative 09/29/2014 3   Final  . Eosinophils Absolute 09/29/2014 0.2  0 - 0.7 K/uL Final  . Basophils Relative 09/29/2014 1   Final  . Basophils Absolute 09/29/2014 0.1  0 - 0.1 K/uL Final  . Sodium 09/29/2014 140  135 - 145 mmol/L Final  . Potassium 09/29/2014 4.7  3.5 - 5.1 mmol/L Final  . Chloride 09/29/2014 103  101 - 111 mmol/L Final  . CO2 09/29/2014 27  22 - 32 mmol/L Final  . Glucose, Bld 09/29/2014 124* 65 - 99 mg/dL Final  . BUN 09/29/2014 13  6 - 20 mg/dL Final  . Creatinine, Ser 09/29/2014 0.88  0.61 - 1.24 mg/dL Final  . Calcium 09/29/2014 9.5  8.9 - 10.3 mg/dL Final  . Total Protein 09/29/2014 6.7  6.5 - 8.1 g/dL Final  . Albumin 09/29/2014 4.0  3.5 - 5.0 g/dL Final  . AST 09/29/2014 17  15 - 41 U/L Final  . ALT 09/29/2014 17  17 - 63 U/L Final  . Alkaline Phosphatase 09/29/2014 94  38 - 126 U/L Final  . Total Bilirubin 09/29/2014 0.6  0.3 - 1.2 mg/dL Final  . GFR calc non Af Amer 09/29/2014 >60  >60 mL/min Final  . GFR calc Af Amer 09/29/2014 >60  >60 mL/min Final  . Anion gap 09/29/2014 10  5 - 15 Final  . TSH 09/29/2014 1.112  0.350 - 4.500 uIU/mL Final  . Hgb A1c MFr Bld 09/29/2014 6.0  4.0 - 6.0 % Final   Allergies: Robert Short has no allergies on file.  Meds: The patient has a current medication list which includes the following prescription(s): acetaminophen, amlodipine, aspirin, baclofen, duloxetine, ibuprofen, losartan, metoprolol succinate, lubiprostone, oxycodone hcl, and benefiber. Requested Prescriptions   Signed Prescriptions Disp Refills  . lubiprostone (AMITIZA) 24 MCG capsule 60 capsule PRN    Sig: Take 1 capsule (24 mcg total) by mouth 2 (two) times daily with a meal. Swallow the medication whole. Do not break or chew the medication.  . Wheat Dextrin (BENEFIBER) POWD 500 g PRN    Sig: Stir 2 tsp. TID into 4-8 oz of any non-carbonated beverage or soft food (hot or cold)  . Oxycodone HCl 10 MG TABS 150  tablet 0    Sig: Take 1 tablet (10 mg total) by mouth 5 (five) times daily as needed.    ROS: Constitutional: Afebrile, no chills, well hydrated and well nourished Gastrointestinal: negative Musculoskeletal:negative Neurological: negative Behavioral/Psych: negative  PFSH: Medical:  Robert Short  has a past medical history of Hypertension; Depression; ASCVD (arteriosclerotic cardiovascular disease); Back pain; Joint pain; Prostate cancer ([place]); Bone cancer ([place]); History of kidney stones; and Back pain (10/09/2012). Family: family history includes Heart attack in his father and mother; Hypertension in his mother. Surgical:  has past surgical history that includes Cardiac catheterization; Wrist surgery; Tonsillectomy; Cervix surgery; Prostatectomy; and Spine surgery. Tobacco:  reports that he has been smoking Cigarettes.  He has a 40 pack-year smoking history. He uses smokeless tobacco. Alcohol:  reports that he drinks about 0.6 oz of alcohol per week. Drug:  reports that he does not use illicit drugs.  Physical Exam: Vitals:  Today's Vitals   12/07/14 1127  BP: 169/92  Pulse: 85  Temp: 98.4  F (36.9 C)  TempSrc: Oral  Resp: 18  Height: 5\' 11"  (1.803 m)  Weight: 203 lb (92.08 kg)  SpO2: 97%  PainSc: 8   Calculated BMI: Body mass index is 28.33 kg/(m^2). General appearance: alert, cooperative, appears older than stated age and moderate distress Eyes: PERLA Respiratory: No evidence respiratory distress, no audible rales or ronchi and no use of accessory muscles of respiration Neck: no adenopathy, no carotid bruit, no JVD, supple, symmetrical, trachea midline and thyroid not enlarged, symmetric, no tenderness/mass/nodules  Cervical Spine ROM: Adequate for flexion, extension, rotation, and lateral bending Palpation: No palpable trigger points  Upper Extremities ROM: Adequate bilaterally Strength: 5/5 for all flexors and extensors of the upper extremity, bilaterally Pulses: Palpable  bilaterally Neurologic: No allodynia, No hyperesthesia, No hyperpathia and No sensory abnormalities  Lumbar Spine ROM: Adequate for flexion, extension, rotation, and lateral bending Palpation: No palpable trigger points Lumbar Hyperextension and rotation: Non-contributory Patrick's Maneuver: Non-contributory  Lower Extremities ROM: Adequate bilaterally Strength: 5/5 for all flexors and extensors of the lower extremity, bilaterally Pulses: Palpable bilaterally Neurologic: No allodynia, No hyperesthesia, No hyperpathia, No sensory abnormalities and No antalgic gait or posture  Assessment: Encounter Diagnosis:  Primary Diagnosis: Chronic pain [G89.29]  Plan: Interventional: None at this time.  Robert Short was seen today for neck pain, back pain and knee pain.  Diagnoses and all orders for this visit:  Chronic pain -     Discontinue: oxycodone (OXY-IR) 5 MG capsule; Take 1 capsule (5 mg total) by mouth every 6 (six) hours as needed for pain. -     Discontinue: oxyCODONE (OXY IR/ROXICODONE) 5 MG immediate release tablet; Take 1 tablet (5 mg total) by mouth every 6 (six) hours as needed for moderate pain or severe pain. -     Discontinue: oxyCODONE (OXY IR/ROXICODONE) 5 MG immediate release tablet; Take 1 tablet (5 mg total) by mouth every 6 (six) hours as needed for moderate pain or severe pain. -     Discontinue: Oxycodone HCl 10 MG TABS; Take 1 tablet (10 mg total) by mouth 5 (five) times daily as needed. -     Oxycodone HCl 10 MG TABS; Take 1 tablet (10 mg total) by mouth 5 (five) times daily as needed.  Long term current use of opiate analgesic -     Drugs of abuse screen w/o alc, rtn urine-sln  Long term prescription opiate use  Opiate use -     lubiprostone (AMITIZA) 24 MCG capsule; Take 1 capsule (24 mcg total) by mouth 2 (two) times daily with a meal. Swallow the medication whole. Do not break or chew the medication. -     Wheat Dextrin (BENEFIBER) POWD; Stir 2 tsp. TID into 4-8  oz of any non-carbonated beverage or soft food (hot or cold)  Encounter for therapeutic drug level monitoring  Encounter for chronic pain management  Therapeutic opioid-induced constipation (OIC) -     lubiprostone (AMITIZA) 24 MCG capsule; Take 1 capsule (24 mcg total) by mouth 2 (two) times daily with a meal. Swallow the medication whole. Do not break or chew the medication. -     Wheat Dextrin (BENEFIBER) POWD; Stir 2 tsp. TID into 4-8 oz of any non-carbonated beverage or soft food (hot or cold)  Chronic neck pain  Cervical spondylosis  Chronic low back pain  Osteoarthritis of spine with radiculopathy, lumbar region  Chronic shoulder pain, unspecified laterality  Prostate cancer metastatic to bone [facility])  Cancer-related pain  Neurogenic pain  Musculoskeletal pain  There are no Patient Instructions on file for this visit. Medications discontinued today:  Medications Discontinued During This Encounter  Medication Reason  . oxyCODONE (OXY IR/ROXICODONE) 5 MG immediate release tablet Error  . oxycodone (OXY-IR) 5 MG capsule Reorder  . oxycodone (OXY-IR) 5 MG capsule Change in therapy  . oxyCODONE (OXY IR/ROXICODONE) 5 MG immediate release tablet Change in therapy  . oxyCODONE (OXY IR/ROXICODONE) 5 MG immediate release tablet Change in therapy   Medications administered today:  Robert Short had no medications administered during this visit.  Primary Care Physician: [name], MD Location: [facility] Facility Note by: Kathlen Brunswick. Dossie Arbour, M.D, DABA, DABAPM, DABPM, DABIPP, FIPP

## 2014-12-12 LAB — TOXASSURE SELECT 13 (MW), URINE: PDF: 0

## 2014-12-22 NOTE — Progress Notes (Signed)
Quick Note:  Positive, undisclosed use of illicit substance (Cannabinoids). Our program has a "Zero Tolerance" for the use of illicit substances. As a consequence of the results obtained on this test, we will no longer offer controlled substances as a therapeutic option for this patient, until the UDS return clear. In addition, we will check to see if this is a recurrent event. Today the patient will be given a final warning and informed that should this event happen again, we will no longer continue to offer opioids as an alternative treatment option. In the event that our records reveal that this warning had previously been given to the patient, we will then immediately discontinue this mode of therapy. ______

## 2015-01-18 ENCOUNTER — Ambulatory Visit (INDEPENDENT_AMBULATORY_CARE_PROVIDER_SITE_OTHER): Payer: 59 | Admitting: Family Medicine

## 2015-01-18 ENCOUNTER — Encounter: Payer: Self-pay | Admitting: Family Medicine

## 2015-01-18 VITALS — BP 133/78 | HR 64 | Resp 16 | Ht 71.0 in | Wt 200.6 lb

## 2015-01-18 DIAGNOSIS — C61 Malignant neoplasm of prostate: Secondary | ICD-10-CM

## 2015-01-18 DIAGNOSIS — I10 Essential (primary) hypertension: Secondary | ICD-10-CM

## 2015-01-18 DIAGNOSIS — L723 Sebaceous cyst: Secondary | ICD-10-CM | POA: Diagnosis not present

## 2015-01-18 DIAGNOSIS — M542 Cervicalgia: Secondary | ICD-10-CM | POA: Diagnosis not present

## 2015-01-18 DIAGNOSIS — F329 Major depressive disorder, single episode, unspecified: Secondary | ICD-10-CM | POA: Diagnosis not present

## 2015-01-18 DIAGNOSIS — G8929 Other chronic pain: Secondary | ICD-10-CM | POA: Diagnosis not present

## 2015-01-18 DIAGNOSIS — F32A Depression, unspecified: Secondary | ICD-10-CM

## 2015-01-18 DIAGNOSIS — R5382 Chronic fatigue, unspecified: Secondary | ICD-10-CM | POA: Diagnosis not present

## 2015-01-18 MED ORDER — DULOXETINE HCL 30 MG PO CPEP: ORAL_CAPSULE | ORAL | Status: DC

## 2015-01-18 NOTE — Progress Notes (Signed)
Name: Robert Short   MRN: [id number]    DOB: 1950-12-11   Date:01/18/2015       Progress Note  Subjective  Chief Complaint  Chief Complaint  Patient presents with  . Hypertension  . Cancer    Left [facility] after recent new dx of bone cancer from prostate.     HPI Here for f/u of HBP.  He has metastatic prostate cancer ands has not followed with oncology for 2 years.  Sees Dr. Daleen Squibb for chronic pain control.    He does not with to address his pre-diabetes today.    He complains of painful sebaceous cysts of his scalp.  Wishes to have them treated by Derm. No problem-specific assessment & plan notes found for this encounter.   Past Medical History  Diagnosis Date  . Hypertension   . Depression   . ASCVD (arteriosclerotic cardiovascular disease)   . Back pain   . Joint pain   . Prostate cancer ([place])   . Bone cancer ([place])   . History of kidney stones   . Back pain 10/09/2012    Past Surgical History  Procedure Laterality Date  . Cardiac catheterization      [facility]  . Wrist surgery    . Tonsillectomy    . Cervix surgery    . Prostatectomy    . Spine surgery      Family History  Problem Relation Age of Onset  . Heart attack Mother   . Hypertension Mother   . Heart attack Father     Social History   Social History  . Marital Status: Married    Spouse Name: N/A  . Number of Children: N/A  . Years of Education: N/A   Occupational History  . Not on file.   Social History Main Topics  . Smoking status: Light Tobacco Smoker -- 40 years    Types: Cigarettes  . Smokeless tobacco: Current User  . Alcohol Use: 0.6 oz/week    1 Cans of beer per week     Comment: daily  . Drug Use: No  . Sexual Activity: Not on file   Other Topics Concern  . Not on file   Social History Narrative     Current outpatient prescriptions:  .  acetaminophen (TYLENOL) 500 MG tablet, Take 1,500 mg by mouth 2 (two) times daily. , Disp: , Rfl:  .  amLODipine (NORVASC) 5 MG tablet,  Take 1 tablet (5 mg total) by mouth daily., Disp: 30 tablet, Rfl: 12 .  aspirin 81 MG tablet, Take 81 mg by mouth daily., Disp: , Rfl:  .  baclofen (LIORESAL) 20 MG tablet, Take 1 tablet (20 mg total) by mouth 2 (two) times daily., Disp: 60 each, Rfl: 12 .  DULoxetine (CYMBALTA) 30 MG capsule, Take 3 capsules each AM. (Patient taking differently: 30 mg daily. Take 3 capsules each AM.), Disp: 90 capsule, Rfl: 6 .  ibuprofen (ADVIL,MOTRIN) 200 MG tablet, Take 800 mg by mouth every 8 (eight) hours as needed. , Disp: , Rfl:  .  losartan (COZAAR) 100 MG tablet, Take 1/2 tablet twice a day., Disp: 90 tablet, Rfl: 3 .  lubiprostone (AMITIZA) 24 MCG capsule, Take 1 capsule (24 mcg total) by mouth 2 (two) times daily with a meal. Swallow the medication whole. Do not break or chew the medication., Disp: 60 capsule, Rfl: PRN .  metoprolol succinate (TOPROL-XL) 50 MG 24 hr tablet, Take 1 tablet (50 mg total) by mouth daily. Take with or  immediately following a meal., Disp: 30 tablet, Rfl: 12 .  Oxycodone HCl 10 MG TABS, Take 1 tablet (10 mg total) by mouth 5 (five) times daily as needed., Disp: 150 tablet, Rfl: 0 .  Wheat Dextrin (BENEFIBER) POWD, Stir 2 tsp. TID into 4-8 oz of any non-carbonated beverage or soft food (hot or cold), Disp: 500 g, Rfl: PRN  Not on File   Review of Systems  Constitutional: Negative for fever, chills, weight loss and malaise/fatigue.  HENT: Negative for hearing loss.   Eyes: Negative for blurred vision and double vision.  Respiratory: Negative for cough, shortness of breath and wheezing.   Cardiovascular: Negative for chest pain, palpitations and leg swelling.  Gastrointestinal: Negative for heartburn, abdominal pain and blood in stool.  Genitourinary: Negative for dysuria, urgency and frequency.  Musculoskeletal: Positive for myalgias, back pain and neck pain.  Neurological: Positive for headaches. Negative for dizziness, tremors and weakness.  Psychiatric/Behavioral:  Negative for depression.      Objective  Filed Vitals:   01/18/15 1545  BP: 133/78  Pulse: 64  Resp: 16  Height: 5\' 11"  (1.803 m)  Weight: 200 lb 9.6 oz (90.992 kg)    Physical Exam  Constitutional: He is oriented to person, place, and time and well-developed, well-nourished, and in no distress. No distress.  HENT:  Head: Normocephalic and atraumatic.  Eyes: Conjunctivae are normal. Pupils are equal, round, and reactive to light. No scleral icterus.  Neck: Normal range of motion. Neck supple. Carotid bruit is not present. No thyromegaly present.  Cardiovascular: Normal rate, regular rhythm and normal heart sounds.  Exam reveals no gallop and no friction rub.   No murmur heard. Pulmonary/Chest: Effort normal and breath sounds normal. No respiratory distress. He has no wheezes. He has no rales.  Abdominal: Soft. Bowel sounds are normal. He exhibits no distension and no mass. There is no tenderness.  Musculoskeletal: He exhibits no edema.  Lymphadenopathy:    He has no cervical adenopathy.  Neurological: He is alert and oriented to person, place, and time.  Skin:  Sebaceous cysts of scalp x 2.  Tender  Psychiatric:  Depressed.  PHQ-9 score of 15.  Vitals reviewed.      Recent Results (from the past 2160 hour(s))  ToxASSURE Select 13 (MW), Urine     Status: None   Collection Time: 12/07/14 12:00 AM  Result Value Ref Range   Report Summary FINAL     Comment: ==================================================================== TOXASSURE SELECT 13 (MW) ==================================================================== Test                             Result       Flag       Units Drug Present not Declared for Prescription Verification   Carboxy-THC                    29           UNEXPECTED ng/mg creat    Carboxy-THC is a metabolite of tetrahydrocannabinol  (THC).    Source of [facility] is most commonly illicit, but THC is also present    in a scheduled prescription  medication. Drug Absent but Declared for Prescription Verification   Oxycodone                      Not Detected UNEXPECTED ng/mg creat ==================================================================== Test  Result    Flag   Units      Ref Range   Creatinine              166              mg/dL      >=20 ==================================================================== Declared Medications:  The flagging and interpretation on this report are b ased on the  following declared medications.  Unexpected results may arise from  inaccuracies in the declared medications.  **Note: The testing scope of this panel includes these medications:  Oxycodone  **Note: The testing scope of this panel does not include following  reported medications:  Duloxetine (Cymbalta) ==================================================================== For clinical consultation, please call [phone]. ====================================================================    PDF .      Assessment & Plan  Problem List Items Addressed This Visit      Cardiovascular and Mediastinum   Essential (primary) hypertension - Primary     Musculoskeletal and Integument   Sebaceous cyst     Genitourinary   Malignant neoplasm of prostate (HCC)   Prostate cancer (Chronic)     Other   Clinical depression   Chronic cervical pain   Fatigue   Chronic pain (Chronic)      Meds ordered this encounter  Medications  . DISCONTD: metoprolol (LOPRESSOR) 50 MG tablet    Sig:     Refill:  3   1. Essential (primary) hypertension Cont. meds  2. Malignant neoplasm of prostate ([place])   3. Prostate cancer  - Ambulatory referral to Hematology  4. Chronic pain   5. Sebaceous cyst  - Ambulatory referral to Dermatology  6. Clinical depression  - DULoxetine (CYMBALTA) 30 MG capsule; Take 2 capsules each AM.  Dispense: 60 capsule; Refill: 6  7. Chronic cervical pain   8. Chronic  fatigue

## 2015-01-27 ENCOUNTER — Ambulatory Visit
Admission: RE | Admit: 2015-01-27 | Discharge: 2015-01-27 | Disposition: A | Discharge status: Home / Self Care | Payer: 59 | Source: Ambulatory Visit | Attending: Radiation Oncology | Admitting: Radiation Oncology

## 2015-01-27 ENCOUNTER — Other Ambulatory Visit: Payer: Self-pay | Admitting: *Deleted

## 2015-01-27 ENCOUNTER — Encounter (INDEPENDENT_AMBULATORY_CARE_PROVIDER_SITE_OTHER): Payer: Self-pay

## 2015-01-27 ENCOUNTER — Encounter: Payer: Self-pay | Admitting: Radiation Oncology

## 2015-01-27 VITALS — BP 150/100 | HR 86 | Temp 96.9°F | Resp 18 | Wt 195.1 lb

## 2015-01-27 DIAGNOSIS — C61 Malignant neoplasm of prostate: Secondary | ICD-10-CM | POA: Insufficient documentation

## 2015-01-27 DIAGNOSIS — Z510 Encounter for antineoplastic radiation therapy: Secondary | ICD-10-CM | POA: Diagnosis not present

## 2015-01-27 DIAGNOSIS — C7951 Secondary malignant neoplasm of bone: Secondary | ICD-10-CM | POA: Insufficient documentation

## 2015-01-27 NOTE — Consult Note (Signed)
Except an outstanding is perfect of Radiation Oncology NEW PATIENT EVALUATION  Name: Robert Short  MRN: [id number]  Date:   01/27/2015     DOB: 29-Jun-1950   This 65 y.o. male patient presents to the clinic for initial evaluation of stage IV prostate cancer.  REFERRING PHYSICIAN: Arlis Porta., MD  CHIEF COMPLAINT:  Chief Complaint  Patient presents with  . Prostate Cancer    Pt is here for initial consultation of prostate cancer.      DIAGNOSIS: The encounter diagnosis was Malignant neoplasm of prostate ([place]).   PREVIOUS INVESTIGATIONS:  Bone scan report has been reviewed from 2014 new bone scan ordered Clinical notes reviewed Dr. Baruch Gouty  HPI: Patient is a 65 year old male status post prostatectomy in 2011 performed at [facility] who presented with biochemical progression of disease with a PSA of 14 in July 2014. Bone scan at that time showed findings which may represent sequela of prior trauma versus metastatic disease to his fifth ribs with metastatic disease in the region of the costochondral junction and right cannot be completely excluded. There is also some abnormal uptake in the periphery of the sacral region on the right he was started on August 2014 on [facility] agonists although his been lost to follow-up and is not seen oncology and over 2 years. He is recently been seen at the pain clinic for narcotic pain control has been sent by his PMD back to the [facility] for evaluation. His major complaint now is right sided rib pain and some lower back pain. He has not had a bone scan about 3 years. His initial pathology report showed a T3a lesion Gleason 7 (4+3) with positive bladder neck invasion and positive extracapsular extension . For pelvic lymph nodes were negative for metastatic disease. No adjuvant radiation therapy was offered  PLANNED TREATMENT REGIMEN: Workup evaluation for extent of metastatic disease at this stage.  PAST MEDICAL HISTORY:  has a past medical  history of Hypertension; Depression; ASCVD (arteriosclerotic cardiovascular disease); Back pain; Joint pain; Prostate cancer ([place]); Bone cancer ([place]); History of kidney stones; and Back pain (10/09/2012).    PAST SURGICAL HISTORY:  Past Surgical History  Procedure Laterality Date  . Cardiac catheterization      [facility]  . Wrist surgery    . Tonsillectomy    . Cervix surgery    . Prostatectomy    . Spine surgery      FAMILY HISTORY: family history includes Heart attack in his father and mother; Hypertension in his mother.  SOCIAL HISTORY:  reports that he has been smoking Cigarettes.  He has smoked for the past 40 years. He uses smokeless tobacco. He reports that he drinks about 0.6 oz of alcohol per week. He reports that he does not use illicit drugs.  ALLERGIES: Review of patient's allergies indicates not on file.  MEDICATIONS:  Current Outpatient Prescriptions  Medication Sig Dispense Refill  . acetaminophen (TYLENOL) 500 MG tablet Take 1,500 mg by mouth 2 (two) times daily.     Marland Kitchen amLODipine (NORVASC) 5 MG tablet Take 1 tablet (5 mg total) by mouth daily. 30 tablet 12  . aspirin 81 MG tablet Take 81 mg by mouth daily.    . baclofen (LIORESAL) 20 MG tablet Take 1 tablet (20 mg total) by mouth 2 (two) times daily. 60 each 12  . DULoxetine (CYMBALTA) 30 MG capsule Take 2 capsules each AM. 60 capsule 6  . ibuprofen (ADVIL,MOTRIN) 200 MG tablet Take 800 mg  by mouth every 8 (eight) hours as needed.     Marland Kitchen losartan (COZAAR) 100 MG tablet Take 1/2 tablet twice a day. 90 tablet 3  . lubiprostone (AMITIZA) 24 MCG capsule Take 1 capsule (24 mcg total) by mouth 2 (two) times daily with a meal. Swallow the medication whole. Do not break or chew the medication. 60 capsule PRN  . metoprolol succinate (TOPROL-XL) 50 MG 24 hr tablet Take 1 tablet (50 mg total) by mouth daily. Take with or immediately following a meal. 30 tablet 12  . Oxycodone HCl 10 MG TABS Take 1 tablet (10 mg total) by mouth 5 (five)  times daily as needed. 150 tablet 0  . Wheat Dextrin (BENEFIBER) POWD Stir 2 tsp. TID into 4-8 oz of any non-carbonated beverage or soft food (hot or cold) 500 g PRN   No current facility-administered medications for this encounter.    ECOG PERFORMANCE STATUS:  1 - Symptomatic but completely ambulatory  REVIEW OF SYSTEMS:  Patient denies any weight loss, fatigue, weakness, fever, chills or night sweats. Patient denies any loss of vision, blurred vision. Patient denies any ringing  of the ears or hearing loss. No irregular heartbeat. Patient denies heart murmur or history of fainting. Patient denies any chest pain or pain radiating to her upper extremities. Patient denies any shortness of breath, difficulty breathing at night, cough or hemoptysis. Patient denies any swelling in the lower legs. Patient denies any nausea vomiting, vomiting of blood, or coffee ground material in the vomitus. Patient denies any stomach pain. Patient states has had normal bowel movements no significant constipation or diarrhea. Patient denies any dysuria, hematuria or significant nocturia. Patient denies any problems walking, swelling in the joints or loss of balance. Patient denies any skin changes, loss of hair or loss of weight. Patient denies any excessive worrying or anxiety or significant depression. Patient denies any problems with insomnia. Patient denies excessive thirst, polyuria, polydipsia. Patient denies any swollen glands, patient denies easy bruising or easy bleeding. Patient denies any recent infections, allergies or URI. Patient "s visual fields have not changed significantly in recent time.    PHYSICAL EXAM: BP 150/100 mmHg  Pulse 86  Temp(Src) 96.9 F (36.1 C)  Resp 18  Wt 195 lb 1.7 oz (88.5 kg) Well-developed male in NAD. Range of motion of the lower extremities does not elicit pain deep palpation of his right rib cage is does not elicit pain deep palpation of the spine does not elicit pain no motor  or sensory levels are appreciated. Well-developed well-nourished patient in NAD. HEENT reveals PERLA, EOMI, discs not visualized.  Oral cavity is clear. No oral mucosal lesions are identified. Neck is clear without evidence of cervical or supraclavicular adenopathy. Lungs are clear to A&P. Cardiac examination is essentially unremarkable with regular rate and rhythm without murmur rub or thrill. Abdomen is benign with no organomegaly or masses noted. Motor sensory and DTR levels are equal and symmetric in the upper and lower extremities. Cranial nerves II through XII are grossly intact. Proprioception is intact. No peripheral adenopathy or edema is identified. No motor or sensory levels are noted. Crude visual fields are within normal range.  LABORATORY DATA: Pathology report from 2011 at [facility]: Bone scan has been ordered   IMPRESSION: Progressive metastatic prostate cancer in 65 year old male.  PLAN: At this time of ordered a bone scan to delineate exactly the extent of disease were dealing with at this time and for possible palliative radiation  therapy. Patient may be a candidate forXofigo treatment. I'm also setting up an appointment with medical oncology to continue Dr. Linward Headland it's care and possible other treatments for his stage IV prostate cancer. I've explained my treatment plan to the patient have ordered a bone scan and follow-up shortly thereafter with me. Also will arrange medical oncology consultation. Patient is comfortable with my treatment plan.  I would like to take this opportunity for allowing me to participate in the care of your patient.Armstead Peaks., MD

## 2015-01-27 NOTE — Progress Notes (Signed)
  Oncology Nurse Navigator Documentation  Navigator Location: [facility] Onc (01/27/15 1600) Navigator Encounter Type: Initial RadOnc (01/27/15 1600)           Patient Visit Type: BWGYKZ (01/27/15 1600) Treatment Phase: Pre-Tx/Tx Discussion (01/27/15 1600) Barriers/Navigation Needs: No barriers at this time (01/27/15 1600)                          Time Spent with Patient: 30 (01/27/15 1600)   Met with Robert Short and his spouse. Introduced Therapist, nutritional and provided him with my contact information for any future questions or concerns.

## 2015-01-29 DIAGNOSIS — D485 Neoplasm of uncertain behavior of skin: Secondary | ICD-10-CM | POA: Diagnosis not present

## 2015-01-29 DIAGNOSIS — C792 Secondary malignant neoplasm of skin: Secondary | ICD-10-CM | POA: Diagnosis not present

## 2015-02-01 ENCOUNTER — Encounter
Admission: RE | Admit: 2015-02-01 | Discharge: 2015-02-01 | Disposition: A | Discharge status: Home / Self Care | Payer: 59 | Source: Ambulatory Visit | Attending: Radiation Oncology | Admitting: Radiation Oncology

## 2015-02-01 DIAGNOSIS — C61 Malignant neoplasm of prostate: Secondary | ICD-10-CM | POA: Insufficient documentation

## 2015-02-01 MED ORDER — TECHNETIUM TC 99M MEDRONATE IV KIT
25.0000 | PACK | Freq: Once | INTRAVENOUS | Status: AC | PRN
  Administered 2015-02-01: 22.77 via INTRAVENOUS

## 2015-02-03 ENCOUNTER — Ambulatory Visit
Admission: RE | Admit: 2015-02-03 | Discharge: 2015-02-03 | Disposition: A | Discharge status: Home / Self Care | Payer: 59 | Source: Ambulatory Visit | Attending: Radiation Oncology | Admitting: Radiation Oncology

## 2015-02-03 ENCOUNTER — Other Ambulatory Visit: Payer: Self-pay | Admitting: *Deleted

## 2015-02-03 ENCOUNTER — Other Ambulatory Visit: Payer: Self-pay | Admitting: Internal Medicine

## 2015-02-03 ENCOUNTER — Encounter: Payer: Self-pay | Admitting: Radiation Oncology

## 2015-02-03 VITALS — BP 130/86 | HR 97 | Temp 97.6°F | Resp 18 | Wt 199.2 lb

## 2015-02-03 DIAGNOSIS — C7951 Secondary malignant neoplasm of bone: Secondary | ICD-10-CM

## 2015-02-03 DIAGNOSIS — C61 Malignant neoplasm of prostate: Secondary | ICD-10-CM | POA: Diagnosis not present

## 2015-02-03 NOTE — Progress Notes (Signed)
Radiation Oncology Follow up Note  Name: Robert Short   Date:   02/03/2015 MRN:  [id number] DOB: [birth year]    This 65 y.o. male presents to the clinic today for follow-up for stage IV metastatic prostate cancer.  REFERRING PROVIDER: Arlis Porta., MD  HPI: Patient is a 65 year old male status post prostatectomy in 2011 who went on to develop biochemical recurrence in 2014. He saw me in consultation with increasing lower back pain and specifically right lower rib cage pain. He had not had a bone scan in 3 years I performed another bone scan. His original tumor was a T3a lesion Gleason 7 (4+3) with positive bladder neck invasion and positive extracapsular extension. Bone scan showed widespread metastatic disease with significant disease in the lower lumbar spine and SI joints. Also significant disease in the right proximal femur and right acetabular area. He does complain of lower back pain and I'm seeing him today for consideration of palliative treatment..  COMPLICATIONS OF TREATMENT: none  FOLLOW UP COMPLIANCE: keeps appointments   PHYSICAL EXAM:  BP 130/86 mmHg  Pulse 97  Temp(Src) 97.6 F (36.4 C)  Resp 18  Wt 199 lb 3 oz (90.35 kg) Deep palpation of his lumbar spine doesn't elicit pain. Range of motion of his lower extremities does not elicit pain he is ambulating fairly well. Well-developed well-nourished patient in NAD. HEENT reveals PERLA, EOMI, discs not visualized.  Oral cavity is clear. No oral mucosal lesions are identified. Neck is clear without evidence of cervical or supraclavicular adenopathy. Lungs are clear to A&P. Cardiac examination is essentially unremarkable with regular rate and rhythm without murmur rub or thrill. Abdomen is benign with no organomegaly or masses noted. Motor sensory and DTR levels are equal and symmetric in the upper and lower extremities. Cranial nerves II through XII are grossly intact. Proprioception is intact. No peripheral adenopathy or  edema is identified. No motor or sensory levels are noted. Crude visual fields are within normal range.  RADIOLOGY RESULTS: Bone scan is reviewed compatible above-stated findings.  PLAN: I have set him up next week to see medical oncology for consideration of treatment. I'm to start palliative radiation therapy to his lumbar spine and SI joints 3000 cGy in 10 fractions. I will also give 1 fraction of 800 cGy to his right proximal femur and acetabulum. I've also discussedXofigo treatments with the patient which based on his widespread metastatic disease may be beneficial. Will start treatment planning and I have set up and ordered CT simulation later this week. We'll also start preparation for Xofigotreatment.  I would like to take this opportunity for allowing me to participate in the care of your patient.Armstead Peaks., MD

## 2015-02-04 ENCOUNTER — Inpatient Hospital Stay: Payer: 59 | Attending: Radiation Oncology

## 2015-02-04 ENCOUNTER — Ambulatory Visit
Admission: RE | Admit: 2015-02-04 | Discharge: 2015-02-04 | Disposition: A | Discharge status: Home / Self Care | Payer: 59 | Source: Ambulatory Visit | Attending: Radiation Oncology | Admitting: Radiation Oncology

## 2015-02-04 ENCOUNTER — Other Ambulatory Visit: Payer: Self-pay | Admitting: Pain Medicine

## 2015-02-04 ENCOUNTER — Ambulatory Visit: Payer: 59 | Attending: Pain Medicine | Admitting: Pain Medicine

## 2015-02-04 ENCOUNTER — Encounter: Payer: Self-pay | Admitting: Pain Medicine

## 2015-02-04 VITALS — BP 123/83 | HR 85 | Temp 97.7°F | Resp 16 | Ht 71.0 in | Wt 198.0 lb

## 2015-02-04 DIAGNOSIS — C7951 Secondary malignant neoplasm of bone: Secondary | ICD-10-CM

## 2015-02-04 DIAGNOSIS — Z510 Encounter for antineoplastic radiation therapy: Secondary | ICD-10-CM | POA: Diagnosis not present

## 2015-02-04 DIAGNOSIS — N529 Male erectile dysfunction, unspecified: Secondary | ICD-10-CM | POA: Diagnosis not present

## 2015-02-04 DIAGNOSIS — Z87442 Personal history of urinary calculi: Secondary | ICD-10-CM | POA: Insufficient documentation

## 2015-02-04 DIAGNOSIS — F1190 Opioid use, unspecified, uncomplicated: Secondary | ICD-10-CM | POA: Diagnosis not present

## 2015-02-04 DIAGNOSIS — K219 Gastro-esophageal reflux disease without esophagitis: Secondary | ICD-10-CM | POA: Diagnosis not present

## 2015-02-04 DIAGNOSIS — E785 Hyperlipidemia, unspecified: Secondary | ICD-10-CM | POA: Diagnosis not present

## 2015-02-04 DIAGNOSIS — C61 Malignant neoplasm of prostate: Secondary | ICD-10-CM | POA: Insufficient documentation

## 2015-02-04 DIAGNOSIS — M4802 Spinal stenosis, cervical region: Secondary | ICD-10-CM

## 2015-02-04 DIAGNOSIS — Z7982 Long term (current) use of aspirin: Secondary | ICD-10-CM | POA: Insufficient documentation

## 2015-02-04 DIAGNOSIS — F329 Major depressive disorder, single episode, unspecified: Secondary | ICD-10-CM | POA: Insufficient documentation

## 2015-02-04 DIAGNOSIS — M545 Low back pain: Secondary | ICD-10-CM | POA: Diagnosis not present

## 2015-02-04 DIAGNOSIS — F17210 Nicotine dependence, cigarettes, uncomplicated: Secondary | ICD-10-CM | POA: Insufficient documentation

## 2015-02-04 DIAGNOSIS — I2510 Atherosclerotic heart disease of native coronary artery without angina pectoris: Secondary | ICD-10-CM | POA: Insufficient documentation

## 2015-02-04 DIAGNOSIS — Z79891 Long term (current) use of opiate analgesic: Secondary | ICD-10-CM

## 2015-02-04 DIAGNOSIS — Z79899 Other long term (current) drug therapy: Secondary | ICD-10-CM | POA: Insufficient documentation

## 2015-02-04 DIAGNOSIS — I10 Essential (primary) hypertension: Secondary | ICD-10-CM | POA: Diagnosis not present

## 2015-02-04 DIAGNOSIS — G893 Neoplasm related pain (acute) (chronic): Secondary | ICD-10-CM | POA: Diagnosis not present

## 2015-02-04 DIAGNOSIS — M47816 Spondylosis without myelopathy or radiculopathy, lumbar region: Secondary | ICD-10-CM | POA: Diagnosis not present

## 2015-02-04 DIAGNOSIS — G8929 Other chronic pain: Secondary | ICD-10-CM

## 2015-02-04 DIAGNOSIS — Z5181 Encounter for therapeutic drug level monitoring: Secondary | ICD-10-CM

## 2015-02-04 DIAGNOSIS — M542 Cervicalgia: Secondary | ICD-10-CM | POA: Insufficient documentation

## 2015-02-04 LAB — CBC
HCT: 43.8 % (ref 40.0–52.0)
Hemoglobin: 14.8 g/dL (ref 13.0–18.0)
MCH: 29.4 pg (ref 26.0–34.0)
MCHC: 33.9 g/dL (ref 32.0–36.0)
MCV: 86.8 fL (ref 80.0–100.0)
PLATELETS: 164 10*3/uL (ref 150–440)
RBC: 5.04 MIL/uL (ref 4.40–5.90)
RDW: 13.7 % (ref 11.5–14.5)
WBC: 7.1 10*3/uL (ref 3.8–10.6)

## 2015-02-04 MED ORDER — OXYCODONE HCL 10 MG PO TABS: 10.0000 mg | ORAL_TABLET | Freq: Every day | ORAL | Status: DC | PRN

## 2015-02-04 NOTE — Progress Notes (Signed)
02-04-15 Has 54 remaining Oxycodone, patient states he has 5 more tabs at home in another bottle.

## 2015-02-04 NOTE — Progress Notes (Signed)
Safety precautions to be maintained throughout the outpatient stay will include: orient to surroundings, keep bed in low position, maintain call bell within reach at all times, provide assistance with transfer out of bed and ambulation.  

## 2015-02-05 ENCOUNTER — Other Ambulatory Visit: Payer: Self-pay | Admitting: *Deleted

## 2015-02-05 ENCOUNTER — Encounter: Payer: Self-pay | Admitting: Pain Medicine

## 2015-02-05 DIAGNOSIS — Z510 Encounter for antineoplastic radiation therapy: Secondary | ICD-10-CM | POA: Diagnosis not present

## 2015-02-05 DIAGNOSIS — C7951 Secondary malignant neoplasm of bone: Secondary | ICD-10-CM | POA: Diagnosis not present

## 2015-02-05 DIAGNOSIS — C61 Malignant neoplasm of prostate: Secondary | ICD-10-CM | POA: Diagnosis not present

## 2015-02-05 NOTE — Progress Notes (Signed)
Patient's Name: Robert Short MRN: [id number] DOB: 08-05-1950 DOS: 02/04/2015  Primary Reason(s) for Visit: Encounter for Medication Management CC: Neck Pain   HPI  Robert Short is a 65 y.o. year old, male patient, who returns today as an established patient. He has CAD (coronary artery disease); Smoker; CFIDS (chronic fatigue and immune dysfunction syndrome); Cervical spinal stenosis; Clinical depression; ED (erectile dysfunction) of organic origin; Essential (primary) hypertension; Acid reflux; HLD (hyperlipidemia); Amnesia; Chronic cervical pain; Neuropathy ([place]); Loss of feeling or sensation; Blood glucose elevated; Prostate cancer; Restless leg; Pain in shoulder; Compulsive tobacco user syndrome; Fatigue; Chronic pain; Long term current use of opiate analgesic; Long term prescription opiate use; Opiate use; Encounter for therapeutic drug level monitoring; Encounter for chronic pain management; Malignant neoplasm of prostate ([place]); Therapeutic opioid-induced constipation (OIC); Chronic neck pain; Cervical spondylosis; Chronic low back pain; Lumbar spondylosis; Chronic shoulder pain; Prostate cancer metastatic to bone [facility]); Cancer-related pain; Neurogenic pain; Musculoskeletal pain; Sebaceous cyst; and Cancer associated pain on his problem list.. His primarily concern today is the Neck Pain   The patient returns to the clinics today for pharmacological management of his chronic pain. Even though the patient's last UDS done on 12/07/2014 came back positive for cannabinoids, we have decided to give him a break and simply instruct him not to use marijuana again. He is a very young looking individual with prostate cancer and metastases to bone. This was recently confirmed with a bone scan. He apparently has metastases to the spine. His primary pain is the lower back followed by the neck right over the C7 spinous processes. He is soon to start radiation therapy for his bone cancer, which may help him bringing some  of the pain down.  Reported Pain Score: 5  Reported level is compatible with observation Pain Type: Chronic pain Pain Descriptors / Indicators: Burning Pain Frequency: Constant  Date of Last Visit: 12/07/14 Service Provided on Last Visit: Med Refill  Pharmacotherapy  Medication(s): Oxycodone 10 mg by mouth 5 times a day. Onset of action: Within expected pharmacological parameters Time to Peak effect: Timing and results are as within normal expected parameters Analgesic Effect: More than 50% Activity Facilitation: Medication(s) allow patient to sit, stand, walk, and do the basic ADLs Perceived Effectiveness: Described as relatively effective, allowing for increase in activities of daily living (ADL) Side-effects or Adverse reactions: None reported Duration of action: Within normal limits for medication [place] PMP: Compliant with practice rules and regulations UDS Results: His last UDS was on 12/07/2014 and it came back positive for cannabinoids. UDS Interpretation: Abnormal results discussed with patient Medication Assessment Form: Reviewed. Abnormalities discussed Treatment compliance: Deficiencies noted and steps taken to remind the patient of the seriousness of adequate therapy compliance Substance Use Disorder (SUD) Risk Level: Moderate Pharmacologic Plan: Continue therapy as is  Lab Work: Illicit Drugs No results found for: THCU, COCAINSCRNUR, PCPSCRNUR, MDMA, AMPHETMU, METHADONE, ETOH  Inflammation Markers No results found for: ESRSEDRATE, CRP  Renal Function Lab Results  Component Value Date   BUN 13 09/29/2014   CREATININE 0.88 09/29/2014   GFRAA >60 09/29/2014   GFRNONAA >60 09/29/2014    Hepatic Function Lab Results  Component Value Date   AST 17 09/29/2014   ALT 17 09/29/2014   ALBUMIN 4.0 09/29/2014    Electrolytes Lab Results  Component Value Date   NA 140 09/29/2014   K 4.7 09/29/2014   CL 103 09/29/2014   CALCIUM 9.5 09/29/2014    Allergies  Mr.  Short  has No Known Allergies.  Meds  The patient has a current medication list which includes the following prescription(s): acetaminophen, amlodipine, aspirin, baclofen, duloxetine, ibuprofen, losartan, lubiprostone, metoprolol succinate, oxycodone hcl, benefiber, oxycodone hcl, and oxycodone hcl.  Current Outpatient Prescriptions on File Prior to Visit  Medication Sig  . acetaminophen (TYLENOL) 500 MG tablet Take 1,500 mg by mouth 2 (two) times daily.   Marland Kitchen amLODipine (NORVASC) 5 MG tablet Take 1 tablet (5 mg total) by mouth daily.  Marland Kitchen aspirin 81 MG tablet Take 81 mg by mouth daily.  . baclofen (LIORESAL) 20 MG tablet Take 1 tablet (20 mg total) by mouth 2 (two) times daily.  . DULoxetine (CYMBALTA) 30 MG capsule Take 2 capsules each AM.  . ibuprofen (ADVIL,MOTRIN) 200 MG tablet Take 800 mg by mouth every 8 (eight) hours as needed.   Marland Kitchen losartan (COZAAR) 100 MG tablet Take 1/2 tablet twice a day.  . lubiprostone (AMITIZA) 24 MCG capsule Take 1 capsule (24 mcg total) by mouth 2 (two) times daily with a meal. Swallow the medication whole. Do not break or chew the medication.  . metoprolol succinate (TOPROL-XL) 50 MG 24 hr tablet Take 1 tablet (50 mg total) by mouth daily. Take with or immediately following a meal.  . Wheat Dextrin (BENEFIBER) POWD Stir 2 tsp. TID into 4-8 oz of any non-carbonated beverage or soft food (hot or cold)   No current facility-administered medications on file prior to visit.    ROS  Constitutional: Afebrile, no chills, well hydrated and well nourished Gastrointestinal: negative Musculoskeletal:negative Neurological: negative Behavioral/Psych: negative  PFSH  Medical:  Robert Short  has a past medical history of Hypertension; Depression; ASCVD (arteriosclerotic cardiovascular disease); Back pain; Joint pain; Prostate cancer ([place]); Bone cancer ([place]); History of kidney stones; Back pain (10/09/2012); Unable to ambulate (10/09/2012); and SOB (shortness of breath)  (10/09/2012). Family: family history includes Heart attack in his father and mother; Hypertension in his mother. Surgical:  has past surgical history that includes Cardiac catheterization; Wrist surgery; Tonsillectomy; Cervix surgery; Prostatectomy; and Spine surgery. Tobacco:  reports that he has been smoking Cigarettes.  He has smoked for the past 40 years. He uses smokeless tobacco. Alcohol:  reports that he drinks about 0.6 oz of alcohol per week. Drug:  reports that he does not use illicit drugs.  Physical Exam  Vitals:  Today's Vitals   02/04/15 1321 02/04/15 1322  BP: 123/83   Pulse: 85   Temp: 97.7 F (36.5 C)   TempSrc: Oral   Resp: 16   Height: 5\' 11"  (1.803 m)   Weight: 198 lb (89.812 kg)   SpO2: 100%   PainSc:  5     Calculated BMI: Body mass index is 27.63 kg/(m^2).  General appearance: alert, cooperative, appears stated age, mild distress and pale Eyes: PERLA Respiratory: No evidence respiratory distress, no audible rales or ronchi and no use of accessory muscles of respiration  Cervical Spine Inspection: Normal anatomy Alignment: Symetrical ROM: Adequate Palpation: WNL Provocative Tests: Spurling's Foraminal Stenosis Test: deferred Hoffman's Cervical Myelopathy Test: deferred Lhermitte Sign (Cord Compression/Myelopathy Test):  deferred  Upper Extremities Inspection: No gross anomalies detected ROM: Decreased Sensory: Normal Motor: Unremarkable  Thoracic Spine Inspection: No gross anomalies detected Alignment: Symetrical ROM: Adequate Palpation: Tender over the C7 spinous process.  Lumbar Spine Inspection: No gross anomalies detected Alignment: Symetrical ROM: Decreased Palpation: Tender Gait: Antalgic (limping) he is using a cane to ambulate  Lower Extremities Inspection: No gross anomalies detected ROM: Adequate Sensory:  Normal Motor: Unremarkable  Toe walk (S1): WNL  Heal walk (L5): WNL  Assessment & Plan  Primary Diagnosis &  Pertinent Problem List: The primary encounter diagnosis was Chronic pain. Diagnoses of Prostate cancer metastatic to bone [facility]), Cancer associated pain, Encounter for therapeutic drug level monitoring, Long term current use of opiate analgesic, Cancer-related pain, Cervical spinal stenosis, and Prostate cancer were also pertinent to this visit.  Visit Diagnosis: 1. Chronic pain   2. Prostate cancer metastatic to bone ([facility])   3. Cancer associated pain   4. Encounter for therapeutic drug level monitoring   5. Long term current use of opiate analgesic   6. Cancer-related pain   7. Cervical spinal stenosis   8. Prostate cancer     Assessment: No problem-specific assessment & plan notes found for this encounter.   Plan of Care  Pharmacotherapy (Medications Ordered): Meds ordered this encounter  Medications  . Oxycodone HCl 10 MG TABS    Sig: Take 1 tablet (10 mg total) by mouth 5 (five) times daily as needed.    Dispense:  150 tablet    Refill:  0    Do not place this medication, or any other prescription from our practice, on "Automatic Refill". Patient may have prescription filled one day early if pharmacy is closed on scheduled refill date. Do not fill until: 02/04/15 To last until: 03/06/15  . Oxycodone HCl 10 MG TABS    Sig: Take 1 tablet (10 mg total) by mouth 5 (five) times daily as needed.    Dispense:  150 tablet    Refill:  0    Do not place this medication, or any other prescription from our practice, on "Automatic Refill". Patient may have prescription filled one day early if pharmacy is closed on scheduled refill date. Do not fill until: 03/06/15 To last until: 04/05/15  . Oxycodone HCl 10 MG TABS    Sig: Take 1 tablet (10 mg total) by mouth 5 (five) times daily as needed.    Dispense:  150 tablet    Refill:  0    Do not place this medication, or any other prescription from our practice, on "Automatic Refill". Patient may have prescription filled one day early if  pharmacy is closed on scheduled refill date. Do not fill until: 04/05/15 To last until: 05/05/15    [facility] & Procedure Ordered: Orders Placed This Encounter  Procedures  . Drugs of abuse screen w/o alc, rtn urine-sln    Volume: 10 ml(s). Minimum 3 ml of urine is needed. Document temperature of fresh sample. Indications: Long term (current) use of opiate analgesic (Z79.891) Test#: [id number] (Tox[employer]ure Select-13)    Imaging Ordered: None  Interventional Therapies: Scheduled: None at this time. PRN Procedures: Trigger point injection around the C7 spinous processes.    Referral(s) or Consult(s): None at this time. He is already being seen by oncology.  Medications administered during this visit: Robert Short had no medications administered during this visit.  Future Appointments Date Time Provider [place]  02/08/2015 8:45 AM [name], MD [facility] None  02/09/2015 1:30 PM [facility]RO LINAC 2 [facility] None  02/10/2015 1:30 PM [facility]RO LINAC 2 [facility] None  02/11/2015 1:30 PM [facility]RO LINAC 2 [facility] None  02/12/2015 1:30 PM [facility]RO LINAC 2 [facility] None  02/15/2015 1:30 PM [facility]RO LINAC 2 [facility] None  02/16/2015 1:30 PM [facility]RO LINAC 2 [facility] None  02/17/2015 1:30 PM [facility]RO LINAC 2 [facility] None  02/18/2015 1:15 PM [facility]MO LAB [facility] None  02/18/2015 1:30 PM [facility]RO LINAC 2 [facility] None  02/19/2015 1:30 PM [facility]RO LINAC 2 [facility] None  02/22/2015 1:30 PM [facility]RO LINAC 2 [facility] None  02/23/2015 1:30 PM [facility]RO LINAC 2 [facility] None  02/24/2015 1:30 PM Noreene Filbert, MD [facility] None  02/25/2015 1:30 PM Noreene Filbert, MD [facility] None  03/22/2015 3:30 PM Arlis Porta., MD [facility] None  05/03/2015 1:40 PM Milinda Pointer, MD [facility] None    Primary Care Physician: [name], MD Location: [facility] Note by: Kathlen Brunswick. Dossie Arbour, M.D, DABA, DABAPM, DABPM, DABIPP, FIPP

## 2015-02-08 ENCOUNTER — Inpatient Hospital Stay: Payer: 59

## 2015-02-08 ENCOUNTER — Encounter: Payer: Self-pay | Admitting: Internal Medicine

## 2015-02-08 ENCOUNTER — Inpatient Hospital Stay (HOSPITAL_BASED_OUTPATIENT_CLINIC_OR_DEPARTMENT_OTHER): Payer: 59 | Admitting: Internal Medicine

## 2015-02-08 ENCOUNTER — Telehealth: Payer: Self-pay | Admitting: *Deleted

## 2015-02-08 VITALS — BP 128/76 | HR 101 | Temp 97.2°F | Resp 18 | Ht 71.0 in | Wt 197.8 lb

## 2015-02-08 DIAGNOSIS — Z87442 Personal history of urinary calculi: Secondary | ICD-10-CM | POA: Diagnosis not present

## 2015-02-08 DIAGNOSIS — F329 Major depressive disorder, single episode, unspecified: Secondary | ICD-10-CM | POA: Diagnosis not present

## 2015-02-08 DIAGNOSIS — I10 Essential (primary) hypertension: Secondary | ICD-10-CM | POA: Diagnosis not present

## 2015-02-08 DIAGNOSIS — Z79899 Other long term (current) drug therapy: Secondary | ICD-10-CM | POA: Diagnosis not present

## 2015-02-08 DIAGNOSIS — F17210 Nicotine dependence, cigarettes, uncomplicated: Secondary | ICD-10-CM

## 2015-02-08 DIAGNOSIS — C7951 Secondary malignant neoplasm of bone: Principal | ICD-10-CM

## 2015-02-08 DIAGNOSIS — Z7982 Long term (current) use of aspirin: Secondary | ICD-10-CM | POA: Diagnosis not present

## 2015-02-08 DIAGNOSIS — C61 Malignant neoplasm of prostate: Secondary | ICD-10-CM | POA: Diagnosis not present

## 2015-02-08 DIAGNOSIS — I2510 Atherosclerotic heart disease of native coronary artery without angina pectoris: Secondary | ICD-10-CM | POA: Diagnosis not present

## 2015-02-08 LAB — COMPREHENSIVE METABOLIC PANEL
ALBUMIN: 4.1 g/dL (ref 3.5–5.0)
ALT: 14 U/L — ABNORMAL LOW (ref 17–63)
ANION GAP: 8 (ref 5–15)
AST: 22 U/L (ref 15–41)
Alkaline Phosphatase: 281 U/L — ABNORMAL HIGH (ref 38–126)
BUN: 13 mg/dL (ref 6–20)
CHLORIDE: 101 mmol/L (ref 101–111)
CO2: 27 mmol/L (ref 22–32)
Calcium: 9.1 mg/dL (ref 8.9–10.3)
Creatinine, Ser: 0.87 mg/dL (ref 0.61–1.24)
GFR calc Af Amer: >60 mL/min (ref >60)
GFR calc non Af Amer: >60 mL/min (ref >60)
GLUCOSE: 113 mg/dL — AB (ref 65–99)
POTASSIUM: 3.6 mmol/L (ref 3.5–5.1)
SODIUM: 136 mmol/L (ref 135–145)
TOTAL PROTEIN: 6.6 g/dL (ref 6.5–8.1)
Total Bilirubin: 0.4 mg/dL (ref 0.3–1.2)

## 2015-02-08 LAB — CBC WITH DIFFERENTIAL/PLATELET
BASOS ABS: 0 10*3/uL (ref 0–0.1)
BASOS PCT: 1 %
EOS ABS: 0.2 10*3/uL (ref 0–0.7)
Eosinophils Relative: 3 %
HCT: 43.2 % (ref 40.0–52.0)
Hemoglobin: 14.7 g/dL (ref 13.0–18.0)
Lymphocytes Relative: 26 %
Lymphs Abs: 2 10*3/uL (ref 1.0–3.6)
MCH: 29.8 pg (ref 26.0–34.0)
MCHC: 34 g/dL (ref 32.0–36.0)
MCV: 87.4 fL (ref 80.0–100.0)
MONO ABS: 0.7 10*3/uL (ref 0.2–1.0)
MONOS PCT: 10 %
Neutro Abs: 4.5 10*3/uL (ref 1.4–6.5)
Neutrophils Relative %: 60 %
PLATELETS: 177 10*3/uL (ref 150–440)
RBC: 4.94 MIL/uL (ref 4.40–5.90)
RDW: 13.6 % (ref 11.5–14.5)
WBC: 7.5 10*3/uL (ref 3.8–10.6)

## 2015-02-08 LAB — PSA: PSA: 1021 ng/mL — ABNORMAL HIGH (ref 0.00–4.00)

## 2015-02-08 MED ORDER — LIDOCAINE-PRILOCAINE 2.5-2.5 % EX CREA: 1.0000 "application " | TOPICAL_CREAM | CUTANEOUS | Status: DC | PRN

## 2015-02-08 MED ORDER — BICALUTAMIDE 50 MG PO TABS: 50.0000 mg | ORAL_TABLET | Freq: Every day | ORAL | Status: DC

## 2015-02-08 NOTE — Progress Notes (Signed)
[place] OFFICE PROGRESS NOTE  Patient Care Team: Arlis Porta., MD as PCP - General (Family Medicine)   SUMMARY OF ONCOLOGIC HISTORY:  # 2011- PROSTATE CANCER [Gleason 3+4]; s/p Prostatectomy[ also involved bladder neck/ECP; Dr.Polaseck]; July 2014- Biochemical recurrence [PSA 14]- started on Zoladex [Dr.Pandit]; lost to follow up.  # JAN 2017- STAGE IV METASTATIC PROSTATE Cancer to Bone- Start casodex+ Lupron q 25m  # Mets to bone- start X-geva q 74M  # Smoker   INTERVAL HISTORY:  This is my first interaction with the patient since I joined the practice September 2016. I reviewed the patient's prior charts/pertinent labs/imaging in detail; findings are summarized above.    [name]  With above history of prostate cancer-  Here to review the  The bone scan/ next plan of care.   Patient admits to poor appetite for many months;  Also admits to port 30 pounds weight loss.  Has chronic nausea no vomiting.  Also complains of worsening pain in his Lower back;  Neck;  Also  Right hip. He is currently taking hydrocodone  4-5 pills a day.    Chronic headaches not any worse.  Denies any unusual shortness of breath or chest pain or cough.   REVIEW OF SYSTEMS:  A complete 10 point review of system is done which is negative except mentioned above/history of present illness.   PAST MEDICAL HISTORY :  Past Medical History  Diagnosis Date  . Hypertension   . Depression   . ASCVD (arteriosclerotic cardiovascular disease)   . Back pain   . Joint pain   . Prostate cancer ([place])   . Bone cancer ([place])   . History of kidney stones   . Back pain 10/09/2012  . Unable to ambulate 10/09/2012  . SOB (shortness of breath) 10/09/2012    PAST SURGICAL HISTORY :   Past Surgical History  Procedure Laterality Date  . Cardiac catheterization      [facility]  . Wrist surgery    . Tonsillectomy    . Cervix surgery    . Prostatectomy    . Spine surgery       FAMILY HISTORY :   Family History  Problem Relation Age of Onset  . Heart attack Mother   . Hypertension Mother   . Heart attack Father     SOCIAL HISTORY:   Social History  Substance Use Topics  . Smoking status: Heavy Tobacco Smoker -- 1.00 packs/day for 40 years    Types: Cigarettes  . Smokeless tobacco: Never Used     Comment: "I use a pack a day in 24 hours. I have quit and started in past."  . Alcohol Use: 0.6 oz/week    1 Cans of beer per week     Comment: daily    ALLERGIES:  has No Known Allergies.  MEDICATIONS:  Current Outpatient Prescriptions  Medication Sig Dispense Refill  . acetaminophen (TYLENOL) 500 MG tablet Take 1,500 mg by mouth 2 (two) times daily.     Marland Kitchen amLODipine (NORVASC) 5 MG tablet Take 1 tablet (5 mg total) by mouth daily. 30 tablet 12  . aspirin 81 MG tablet Take 81 mg by mouth daily.    . baclofen (LIORESAL) 20 MG tablet Take 1 tablet (20 mg total) by mouth 2 (two) times daily. 60 each 12  . DULoxetine (CYMBALTA) 30 MG capsule Take 2 capsules each AM. 60 capsule 6  . ibuprofen (ADVIL,MOTRIN) 200 MG tablet Take  800 mg by mouth every 8 (eight) hours as needed.     Marland Kitchen losartan (COZAAR) 100 MG tablet Take 1/2 tablet twice a day. 90 tablet 3  . lubiprostone (AMITIZA) 24 MCG capsule Take 1 capsule (24 mcg total) by mouth 2 (two) times daily with a meal. Swallow the medication whole. Do not break or chew the medication. 60 capsule PRN  . metoprolol succinate (TOPROL-XL) 50 MG 24 hr tablet Take 1 tablet (50 mg total) by mouth daily. Take with or immediately following a meal. 30 tablet 12  . Oxycodone HCl 10 MG TABS Take 1 tablet (10 mg total) by mouth 5 (five) times daily as needed. 150 tablet 0  . Oxycodone HCl 10 MG TABS Take 1 tablet (10 mg total) by mouth 5 (five) times daily as needed. 150 tablet 0  . Oxycodone HCl 10 MG TABS Take 1 tablet (10 mg total) by mouth 5 (five) times daily as needed. 150 tablet 0  . Wheat Dextrin (BENEFIBER) POWD Stir 2  tsp. TID into 4-8 oz of any non-carbonated beverage or soft food (hot or cold) 500 g PRN  . lidocaine-prilocaine (EMLA) cream Apply 1 application topically as needed. 30 g 3   No current facility-administered medications for this visit.    PHYSICAL EXAMINATION: ECOG PERFORMANCE STATUS: 1 - Symptomatic but completely ambulatory  BP 128/76 mmHg  Pulse 101  Temp(Src) 97.2 F (36.2 C) (Tympanic)  Resp 18  Ht 5\' 11"  (1.803 m)  Wt 197 lb 12 oz (89.7 kg)  BMI 27.59 kg/m2  Filed Weights   02/08/15 0900  Weight: 197 lb 12 oz (89.7 kg)    GENERAL: Well-nourished well-developed; Alert, no distress and comfortable.    Accompanied by his wife. EYES: no pallor or icterus OROPHARYNX: no thrush or ulceration;  Upper dentures. NECK: supple, no masses felt LYMPH:  no palpable lymphadenopathy in the cervical, axillary or inguinal regions LUNGS: clear to auscultation and  No wheeze or crackles HEART/CVS: regular rate & rhythm and no murmurs; No lower extremity edema ABDOMEN:abdomen soft, non-tender and normal bowel sounds;  Question hepatomegaly. Musculoskeletal:no cyanosis of digits and no clubbing  PSYCH: alert & oriented x 3 with fluent speech NEURO: no focal motor/sensory deficits SKIN:  no rashes or significant lesions  LABORATORY DATA:  I have reviewed the data as listed    Component Value Date/Time   NA 140 09/29/2014 1352   NA 140 07/13/2013 1542   K 4.7 09/29/2014 1352   K 4.3 07/13/2013 1542   CL 103 09/29/2014 1352   CL 107 07/13/2013 1542   CO2 27 09/29/2014 1352   CO2 26 07/13/2013 1542   GLUCOSE 124* 09/29/2014 1352   GLUCOSE 106* 07/13/2013 1542   BUN 13 09/29/2014 1352   BUN 11 07/13/2013 1542   CREATININE 0.88 09/29/2014 1352   CREATININE 1.01 07/13/2013 1542   CALCIUM 9.5 09/29/2014 1352   CALCIUM 9.9 07/13/2013 1542   PROT 6.7 09/29/2014 1352   PROT 8.1 07/13/2013 1542   ALBUMIN 4.0 09/29/2014 1352   ALBUMIN 4.3 07/13/2013 1542   AST 17 09/29/2014 1352    AST 26 07/13/2013 1542   ALT 17 09/29/2014 1352   ALT 32 07/13/2013 1542   ALKPHOS 94 09/29/2014 1352   ALKPHOS 74 07/13/2013 1542   BILITOT 0.6 09/29/2014 1352   BILITOT 0.4 07/13/2013 1542   GFRNONAA >60 09/29/2014 1352   GFRNONAA >60 07/13/2013 1542   GFRAA >60 09/29/2014 1352   GFRAA >60 07/13/2013 1542  No results found for: SPEP, UPEP  Lab Results  Component Value Date   WBC 7.1 02/04/2015   NEUTROABS 4.1 09/29/2014   HGB 14.8 02/04/2015   HCT 43.8 02/04/2015   MCV 86.8 02/04/2015   PLT 164 02/04/2015      Chemistry      Component Value Date/Time   NA 140 09/29/2014 1352   NA 140 07/13/2013 1542   K 4.7 09/29/2014 1352   K 4.3 07/13/2013 1542   CL 103 09/29/2014 1352   CL 107 07/13/2013 1542   CO2 27 09/29/2014 1352   CO2 26 07/13/2013 1542   BUN 13 09/29/2014 1352   BUN 11 07/13/2013 1542   CREATININE 0.88 09/29/2014 1352   CREATININE 1.01 07/13/2013 1542      Component Value Date/Time   CALCIUM 9.5 09/29/2014 1352   CALCIUM 9.9 07/13/2013 1542   ALKPHOS 94 09/29/2014 1352   ALKPHOS 74 07/13/2013 1542   AST 17 09/29/2014 1352   AST 26 07/13/2013 1542   ALT 17 09/29/2014 1352   ALT 32 07/13/2013 1542   BILITOT 0.6 09/29/2014 1352   BILITOT 0.4 07/13/2013 1542       RADIOGRAPHIC STUDIES:  There has been interval development of widespread bony metastatic disease. Increased uptake about the right acetabulum and the in the intertrochanteric region of the right hip   ASSESSMENT & PLAN:   # METASTATIC PROSTATE CANCER-  With widespread metastases to the bone.  I recommend getting CT chest/ abdomen pelvis with contrast.  Recommend checking CBC CMP and PSA testosterone.  Stage IV cancer;  Discussed that the goals of treatment are palliative;  And this is not curative.  #  I recommend anti- hormone therapy with eligard every 3 months; Start casodex today/over lap for 2 weeks.   # I reviewed the new data from STAMPEDE/CHAARTED clinical trials-in patients  with high-risk /castrate sensitive metastatic prostate cancer; that have shown the benefit of chemotherapy with approximately 1-2 years of PFS/also overall survival benefit. Taxotere every 3 weeks 6 cycles is recommended.  Discussed the potential side effects including but not limited to-increasing fatigue, nausea vomiting, diarrhea, hair loss, sores in the mouth, increase risk of infection and also neuropathy.   #  Metastatic disease to the bone-  Recommend  X-geva  Every 3 months.  # Back pain- sec to malignancy on palliative RT;  Continue current pain management.  #   Patient will see me back in approximately  One week or so few days after the results of the imaging.   # 40 minutes face-to-face with the patient discussing the above plan of care; more than 50% of time spent on prognosis/ natural history; counseling and coordination.     [name], MD 02/08/2015 9:53 AM

## 2015-02-08 NOTE — Telephone Encounter (Signed)
Called and explained to patient that casodex RX was faxed into patient's pharmacy. He needs to get started on this new RX asap. He will be scheduled for xgeva and lupron next Monday. Pt needs to take this drug for a week before the xgeva and lupron is administered. Asked pt to contact cancer center to ensure that this message was received.

## 2015-02-08 NOTE — Progress Notes (Signed)
The patient is being referred back by Dr. Baruch Gouty. He is accompanied by his wife today at today's visit. He is here to discuss further treatment discussions with Dr. Rogue Bussing. He reports intermittent episodes of nausea and mild constipation.

## 2015-02-09 ENCOUNTER — Ambulatory Visit: Payer: 59 | Admitting: Internal Medicine

## 2015-02-09 ENCOUNTER — Ambulatory Visit
Admission: RE | Admit: 2015-02-09 | Discharge: 2015-02-09 | Disposition: A | Discharge status: Home / Self Care | Payer: 59 | Source: Ambulatory Visit | Attending: Radiation Oncology | Admitting: Radiation Oncology

## 2015-02-09 DIAGNOSIS — Z510 Encounter for antineoplastic radiation therapy: Secondary | ICD-10-CM | POA: Diagnosis not present

## 2015-02-09 DIAGNOSIS — C7951 Secondary malignant neoplasm of bone: Secondary | ICD-10-CM | POA: Diagnosis not present

## 2015-02-09 DIAGNOSIS — C61 Malignant neoplasm of prostate: Secondary | ICD-10-CM | POA: Diagnosis not present

## 2015-02-09 LAB — TESTOSTERONE: Testosterone: 283 ng/dL — ABNORMAL LOW (ref 348–1197)

## 2015-02-10 ENCOUNTER — Ambulatory Visit
Admission: RE | Admit: 2015-02-10 | Discharge: 2015-02-10 | Disposition: A | Discharge status: Home / Self Care | Payer: 59 | Source: Ambulatory Visit | Attending: Radiation Oncology | Admitting: Radiation Oncology

## 2015-02-10 DIAGNOSIS — C7951 Secondary malignant neoplasm of bone: Secondary | ICD-10-CM | POA: Diagnosis not present

## 2015-02-10 DIAGNOSIS — Z510 Encounter for antineoplastic radiation therapy: Secondary | ICD-10-CM | POA: Diagnosis not present

## 2015-02-10 DIAGNOSIS — C61 Malignant neoplasm of prostate: Secondary | ICD-10-CM | POA: Diagnosis not present

## 2015-02-11 ENCOUNTER — Ambulatory Visit
Admission: RE | Admit: 2015-02-11 | Discharge: 2015-02-11 | Disposition: A | Discharge status: Home / Self Care | Payer: 59 | Source: Ambulatory Visit | Attending: Radiation Oncology | Admitting: Radiation Oncology

## 2015-02-11 ENCOUNTER — Ambulatory Visit
Admission: RE | Admit: 2015-02-11 | Discharge: 2015-02-11 | Disposition: A | Discharge status: Home / Self Care | Payer: 59 | Source: Ambulatory Visit | Attending: Internal Medicine | Admitting: Internal Medicine

## 2015-02-11 DIAGNOSIS — C7951 Secondary malignant neoplasm of bone: Secondary | ICD-10-CM | POA: Insufficient documentation

## 2015-02-11 DIAGNOSIS — C61 Malignant neoplasm of prostate: Secondary | ICD-10-CM | POA: Diagnosis not present

## 2015-02-11 DIAGNOSIS — Z510 Encounter for antineoplastic radiation therapy: Secondary | ICD-10-CM | POA: Diagnosis not present

## 2015-02-11 LAB — TOXASSURE SELECT 13 (MW), URINE: PDF: 0

## 2015-02-11 MED ORDER — IOHEXOL 350 MG/ML SOLN
100.0000 mL | Freq: Once | INTRAVENOUS | Status: AC | PRN
  Administered 2015-02-11: 100 mL via INTRAVENOUS

## 2015-02-12 ENCOUNTER — Ambulatory Visit: Payer: 59 | Admitting: Internal Medicine

## 2015-02-12 ENCOUNTER — Ambulatory Visit
Admission: RE | Admit: 2015-02-12 | Discharge: 2015-02-12 | Disposition: A | Discharge status: Home / Self Care | Payer: 59 | Source: Ambulatory Visit

## 2015-02-12 DIAGNOSIS — C7951 Secondary malignant neoplasm of bone: Secondary | ICD-10-CM | POA: Diagnosis not present

## 2015-02-12 DIAGNOSIS — Z510 Encounter for antineoplastic radiation therapy: Secondary | ICD-10-CM | POA: Diagnosis not present

## 2015-02-12 DIAGNOSIS — C61 Malignant neoplasm of prostate: Secondary | ICD-10-CM | POA: Diagnosis not present

## 2015-02-15 ENCOUNTER — Encounter: Payer: Self-pay | Admitting: Internal Medicine

## 2015-02-15 ENCOUNTER — Inpatient Hospital Stay (HOSPITAL_BASED_OUTPATIENT_CLINIC_OR_DEPARTMENT_OTHER): Payer: 59 | Admitting: Internal Medicine

## 2015-02-15 ENCOUNTER — Other Ambulatory Visit: Payer: Self-pay | Admitting: *Deleted

## 2015-02-15 ENCOUNTER — Inpatient Hospital Stay: Payer: 59

## 2015-02-15 ENCOUNTER — Ambulatory Visit
Admission: RE | Admit: 2015-02-15 | Discharge: 2015-02-15 | Disposition: A | Discharge status: Home / Self Care | Payer: 59 | Source: Ambulatory Visit | Attending: Radiation Oncology | Admitting: Radiation Oncology

## 2015-02-15 VITALS — BP 123/79 | HR 86 | Temp 97.0°F | Resp 18 | Ht 71.0 in | Wt 194.2 lb

## 2015-02-15 DIAGNOSIS — F17210 Nicotine dependence, cigarettes, uncomplicated: Secondary | ICD-10-CM | POA: Diagnosis not present

## 2015-02-15 DIAGNOSIS — Z79818 Long term (current) use of other agents affecting estrogen receptors and estrogen levels: Secondary | ICD-10-CM | POA: Diagnosis not present

## 2015-02-15 DIAGNOSIS — Z79899 Other long term (current) drug therapy: Secondary | ICD-10-CM | POA: Diagnosis not present

## 2015-02-15 DIAGNOSIS — C7951 Secondary malignant neoplasm of bone: Secondary | ICD-10-CM

## 2015-02-15 DIAGNOSIS — Z510 Encounter for antineoplastic radiation therapy: Secondary | ICD-10-CM | POA: Diagnosis not present

## 2015-02-15 DIAGNOSIS — Z7982 Long term (current) use of aspirin: Secondary | ICD-10-CM | POA: Diagnosis not present

## 2015-02-15 DIAGNOSIS — I10 Essential (primary) hypertension: Secondary | ICD-10-CM | POA: Diagnosis not present

## 2015-02-15 DIAGNOSIS — I2510 Atherosclerotic heart disease of native coronary artery without angina pectoris: Secondary | ICD-10-CM | POA: Diagnosis not present

## 2015-02-15 DIAGNOSIS — C61 Malignant neoplasm of prostate: Secondary | ICD-10-CM

## 2015-02-15 DIAGNOSIS — F329 Major depressive disorder, single episode, unspecified: Secondary | ICD-10-CM | POA: Diagnosis not present

## 2015-02-15 DIAGNOSIS — Z87442 Personal history of urinary calculi: Secondary | ICD-10-CM | POA: Diagnosis not present

## 2015-02-15 MED ORDER — DENOSUMAB 120 MG/1.7ML ~~LOC~~ SOLN
120.0000 mg | Freq: Once | SUBCUTANEOUS | Status: AC
  Administered 2015-02-15: 120 mg via SUBCUTANEOUS
  Filled 2015-02-15: qty 1.7

## 2015-02-15 MED ORDER — OXYCODONE HCL ER 20 MG PO T12A: 20.0000 mg | EXTENDED_RELEASE_TABLET | Freq: Two times a day (BID) | ORAL | Status: DC

## 2015-02-15 NOTE — Progress Notes (Signed)
[place] OFFICE PROGRESS NOTE  Patient Care Team: Arlis Porta., MD as PCP - General (Family Medicine)   SUMMARY OF ONCOLOGIC HISTORY:  # 2011- PROSTATE CANCER [Gleason 3+4]; s/p Prostatectomy[ also involved bladder neck/ECP; Dr.Polaseck]; July 2014- Biochemical recurrence [PSA 14]- started on Zoladex [Dr.Pandit]; lost to follow up.  # JAN 2017- STAGE IV METASTATIC PROSTATE Cancer to Bone- Feb 13th, 2017- Start casodex+ Lupron q 61m [~end of feb]  # Mets to bone- start X-geva q 58M   # Smoker/ chronic pain/pain clinic   INTERVAL HISTORY:   [name]  With above history of prostate cancer-  Here to review the  CT scan/ next plan of care. Patient continues to have worsening back pain. He complains of difficulty sleeping at night. He is taking about 3-4 oxycodone during the day.   Patient was given a prescription for Casodex last week; however he did not present to his voice mail. He just picked up the prescription this morning. He has not started that yet.  No new shortness of breath or chest pain or cough. Continues to have nausea without vomiting. Poor appetite.    REVIEW OF SYSTEMS:  A complete 10 point review of system is done which is negative except mentioned above/history of present illness.   PAST MEDICAL HISTORY :  Past Medical History  Diagnosis Date  . Hypertension   . Depression   . ASCVD (arteriosclerotic cardiovascular disease)   . Back pain   . Joint pain   . Prostate cancer ([place])   . Bone cancer ([place])   . History of kidney stones   . Back pain 10/09/2012  . Unable to ambulate 10/09/2012  . SOB (shortness of breath) 10/09/2012    PAST SURGICAL HISTORY :   Past Surgical History  Procedure Laterality Date  . Cardiac catheterization      [facility]  . Wrist surgery    . Tonsillectomy    . Cervix surgery    . Prostatectomy    . Spine surgery      FAMILY HISTORY :   Family History  Problem Relation Age of  Onset  . Heart attack Mother   . Hypertension Mother   . Heart attack Father     SOCIAL HISTORY:   Social History  Substance Use Topics  . Smoking status: Heavy Tobacco Smoker -- 1.00 packs/day for 40 years    Types: Cigarettes  . Smokeless tobacco: Never Used     Comment: "I use a pack a day in 24 hours. I have quit and started in past."  . Alcohol Use: 0.6 oz/week    1 Cans of beer per week     Comment: daily    ALLERGIES:  has No Known Allergies.  MEDICATIONS:  Current Outpatient Prescriptions  Medication Sig Dispense Refill  . acetaminophen (TYLENOL) 500 MG tablet Take 1,500 mg by mouth 2 (two) times daily.     Marland Kitchen amLODipine (NORVASC) 5 MG tablet Take 1 tablet (5 mg total) by mouth daily. 30 tablet 12  . aspirin 81 MG tablet Take 81 mg by mouth daily.    . baclofen (LIORESAL) 20 MG tablet Take 1 tablet (20 mg total) by mouth 2 (two) times daily. 60 each 12  . bicalutamide (CASODEX) 50 MG tablet Take 1 tablet (50 mg total) by mouth daily. Start as soon as possible. 30 tablet 0  . DULoxetine (CYMBALTA) 30 MG capsule Take 2 capsules each AM. 60 capsule 6  .  ibuprofen (ADVIL,MOTRIN) 200 MG tablet Take 800 mg by mouth every 8 (eight) hours as needed.     . lidocaine-prilocaine (EMLA) cream Apply 1 application topically as needed. 30 g 3  . losartan (COZAAR) 100 MG tablet Take 1/2 tablet twice a day. 90 tablet 3  . lubiprostone (AMITIZA) 24 MCG capsule Take 1 capsule (24 mcg total) by mouth 2 (two) times daily with a meal. Swallow the medication whole. Do not break or chew the medication. 60 capsule PRN  . metoprolol succinate (TOPROL-XL) 50 MG 24 hr tablet Take 1 tablet (50 mg total) by mouth daily. Take with or immediately following a meal. 30 tablet 12  . Oxycodone HCl 10 MG TABS Take 1 tablet (10 mg total) by mouth 5 (five) times daily as needed. 150 tablet 0  . Oxycodone HCl 10 MG TABS Take 1 tablet (10 mg total) by mouth 5 (five) times daily as needed. 150 tablet 0  . Oxycodone  HCl 10 MG TABS Take 1 tablet (10 mg total) by mouth 5 (five) times daily as needed. 150 tablet 0  . Wheat Dextrin (BENEFIBER) POWD Stir 2 tsp. TID into 4-8 oz of any non-carbonated beverage or soft food (hot or cold) 500 g PRN   No current facility-administered medications for this visit.    PHYSICAL EXAMINATION: ECOG PERFORMANCE STATUS: 1 - Symptomatic but completely ambulatory  BP 123/79 mmHg  Pulse 86  Temp(Src) 97 F (36.1 C) (Tympanic)  Resp 18  Ht 5\' 11"  (1.803 m)  Wt 194 lb 3.6 oz (88.1 kg)  BMI 27.10 kg/m2  Filed Weights   02/15/15 1416  Weight: 194 lb 3.6 oz (88.1 kg)    GENERAL: Well-nourished well-developed; Alert, no distress and comfortable.    Accompanied by his wife.   LABORATORY DATA:  I have reviewed the data as listed    Component Value Date/Time   NA 136 02/08/2015 0952   NA 140 07/13/2013 1542   K 3.6 02/08/2015 0952   K 4.3 07/13/2013 1542   CL 101 02/08/2015 0952   CL 107 07/13/2013 1542   CO2 27 02/08/2015 0952   CO2 26 07/13/2013 1542   GLUCOSE 113* 02/08/2015 0952   GLUCOSE 106* 07/13/2013 1542   BUN 13 02/08/2015 0952   BUN 11 07/13/2013 1542   CREATININE 0.87 02/08/2015 0952   CREATININE 1.01 07/13/2013 1542   CALCIUM 9.1 02/08/2015 0952   CALCIUM 9.9 07/13/2013 1542   PROT 6.6 02/08/2015 0952   PROT 8.1 07/13/2013 1542   ALBUMIN 4.1 02/08/2015 0952   ALBUMIN 4.3 07/13/2013 1542   AST 22 02/08/2015 0952   AST 26 07/13/2013 1542   ALT 14* 02/08/2015 0952   ALT 32 07/13/2013 1542   ALKPHOS 281* 02/08/2015 0952   ALKPHOS 74 07/13/2013 1542   BILITOT 0.4 02/08/2015 0952   BILITOT 0.4 07/13/2013 1542   GFRNONAA >60 02/08/2015 0952   GFRNONAA >60 07/13/2013 1542   GFRAA >60 02/08/2015 0952   GFRAA >60 07/13/2013 1542    No results found for: SPEP, UPEP  Lab Results  Component Value Date   WBC 7.5 02/08/2015   NEUTROABS 4.5 02/08/2015   HGB 14.7 02/08/2015   HCT 43.2 02/08/2015   MCV 87.4 02/08/2015   PLT 177 02/08/2015       Chemistry      Component Value Date/Time   NA 136 02/08/2015 0952   NA 140 07/13/2013 1542   K 3.6 02/08/2015 0952   K 4.3 07/13/2013 1542  CL 101 02/08/2015 0952   CL 107 07/13/2013 1542   CO2 27 02/08/2015 0952   CO2 26 07/13/2013 1542   BUN 13 02/08/2015 0952   BUN 11 07/13/2013 1542   CREATININE 0.87 02/08/2015 0952   CREATININE 1.01 07/13/2013 1542      Component Value Date/Time   CALCIUM 9.1 02/08/2015 0952   CALCIUM 9.9 07/13/2013 1542   ALKPHOS 281* 02/08/2015 0952   ALKPHOS 74 07/13/2013 1542   AST 22 02/08/2015 0952   AST 26 07/13/2013 1542   ALT 14* 02/08/2015 0952   ALT 32 07/13/2013 1542   BILITOT 0.4 02/08/2015 0952   BILITOT 0.4 07/13/2013 1542       RADIOGRAPHIC STUDIES:  There has been interval development of widespread bony metastatic disease. Increased uptake about the right acetabulum and the in the intertrochanteric region of the right hip. CT chest and pelvis -no evidence of any visceral metastases.    ASSESSMENT & PLAN:   # METASTATIC PROSTATE CANCER-  With widespread metastases to the bone.  CT chest negative for any visceral metastases. Recommend starting Casodex ASAP. He will start  Lupron approximately 2 weeks. Again discussed the importance of chemotherapy in Its sensitive prostate cancer based on the new data. Patient is very reluctant with chemotherapy. His concerned about the side effects. But he has not completely ruled out chemotherapy as an option also.  # Recommend starting next [place] today. Discussed about hypocalcemia/osteonecrosis of the jaw.   # Pain management- recommend starting OxyContin 20 mg twice a day; continue oxycodone for short-acting pain medication. Pt goes to pain clinic. We will take over patient's pain regimen- given his metastatic cancer to the bone.  #  Patient follow-up in approximately 4 weeks/in [place]/CBC CMP.   # 40 minutes face-to-face with the patient discussing the above plan of care; more than 50% of  time spent on prognosis/ natural history; counseling and coordination.     [name], MD 02/15/2015 3:02 PM

## 2015-02-16 ENCOUNTER — Ambulatory Visit
Admission: RE | Admit: 2015-02-16 | Discharge: 2015-02-16 | Disposition: A | Discharge status: Home / Self Care | Payer: 59 | Source: Ambulatory Visit | Attending: Radiation Oncology | Admitting: Radiation Oncology

## 2015-02-16 DIAGNOSIS — Z510 Encounter for antineoplastic radiation therapy: Secondary | ICD-10-CM | POA: Diagnosis not present

## 2015-02-16 DIAGNOSIS — C61 Malignant neoplasm of prostate: Secondary | ICD-10-CM | POA: Diagnosis not present

## 2015-02-16 DIAGNOSIS — C7951 Secondary malignant neoplasm of bone: Secondary | ICD-10-CM | POA: Diagnosis not present

## 2015-02-17 ENCOUNTER — Ambulatory Visit
Admission: RE | Admit: 2015-02-17 | Discharge: 2015-02-17 | Disposition: A | Discharge status: Home / Self Care | Payer: 59 | Source: Ambulatory Visit | Attending: Radiation Oncology | Admitting: Radiation Oncology

## 2015-02-17 DIAGNOSIS — Z510 Encounter for antineoplastic radiation therapy: Secondary | ICD-10-CM | POA: Diagnosis not present

## 2015-02-17 DIAGNOSIS — C7951 Secondary malignant neoplasm of bone: Secondary | ICD-10-CM | POA: Diagnosis not present

## 2015-02-17 DIAGNOSIS — C61 Malignant neoplasm of prostate: Secondary | ICD-10-CM | POA: Diagnosis not present

## 2015-02-18 ENCOUNTER — Inpatient Hospital Stay: Payer: 59

## 2015-02-18 ENCOUNTER — Ambulatory Visit
Admission: RE | Admit: 2015-02-18 | Discharge: 2015-02-18 | Disposition: A | Discharge status: Home / Self Care | Payer: 59 | Source: Ambulatory Visit | Attending: Radiation Oncology | Admitting: Radiation Oncology

## 2015-02-18 ENCOUNTER — Telehealth: Payer: Self-pay | Admitting: Pain Medicine

## 2015-02-18 DIAGNOSIS — Z510 Encounter for antineoplastic radiation therapy: Secondary | ICD-10-CM | POA: Diagnosis not present

## 2015-02-18 DIAGNOSIS — C61 Malignant neoplasm of prostate: Secondary | ICD-10-CM | POA: Diagnosis not present

## 2015-02-18 DIAGNOSIS — C7951 Secondary malignant neoplasm of bone: Secondary | ICD-10-CM | POA: Diagnosis not present

## 2015-02-18 NOTE — Telephone Encounter (Signed)
Patient was seen @ [facility] and has Dx of prostate and bone cancer with bone mets The MD there has increased the patients oxycodone to 20mg  every 12 hours and also stated him on Lupron and Xgeva medications Mr Grandberry is also doing radiation treatment  If you have further questions please call Hassan Rowan back

## 2015-02-18 NOTE — Telephone Encounter (Signed)
Dr Naveira notified.  

## 2015-02-19 ENCOUNTER — Ambulatory Visit
Admission: RE | Admit: 2015-02-19 | Discharge: 2015-02-19 | Disposition: A | Discharge status: Home / Self Care | Payer: 59 | Source: Ambulatory Visit | Attending: Radiation Oncology | Admitting: Radiation Oncology

## 2015-02-19 DIAGNOSIS — C61 Malignant neoplasm of prostate: Secondary | ICD-10-CM | POA: Diagnosis not present

## 2015-02-19 DIAGNOSIS — C7951 Secondary malignant neoplasm of bone: Secondary | ICD-10-CM | POA: Diagnosis not present

## 2015-02-19 DIAGNOSIS — Z510 Encounter for antineoplastic radiation therapy: Secondary | ICD-10-CM | POA: Diagnosis not present

## 2015-02-22 ENCOUNTER — Ambulatory Visit
Admission: RE | Admit: 2015-02-22 | Discharge: 2015-02-22 | Disposition: A | Discharge status: Home / Self Care | Payer: 59 | Source: Ambulatory Visit | Attending: Radiation Oncology | Admitting: Radiation Oncology

## 2015-02-22 DIAGNOSIS — C7951 Secondary malignant neoplasm of bone: Secondary | ICD-10-CM | POA: Diagnosis not present

## 2015-02-22 DIAGNOSIS — C61 Malignant neoplasm of prostate: Secondary | ICD-10-CM | POA: Diagnosis not present

## 2015-02-22 DIAGNOSIS — Z510 Encounter for antineoplastic radiation therapy: Secondary | ICD-10-CM | POA: Diagnosis not present

## 2015-02-23 ENCOUNTER — Ambulatory Visit
Admission: RE | Admit: 2015-02-23 | Discharge: 2015-02-23 | Disposition: A | Discharge status: Home / Self Care | Payer: 59 | Source: Ambulatory Visit | Attending: Radiation Oncology | Admitting: Radiation Oncology

## 2015-02-23 DIAGNOSIS — C7951 Secondary malignant neoplasm of bone: Secondary | ICD-10-CM | POA: Diagnosis not present

## 2015-02-23 DIAGNOSIS — C61 Malignant neoplasm of prostate: Secondary | ICD-10-CM | POA: Diagnosis not present

## 2015-02-23 DIAGNOSIS — Z510 Encounter for antineoplastic radiation therapy: Secondary | ICD-10-CM | POA: Diagnosis not present

## 2015-02-24 ENCOUNTER — Ambulatory Visit
Admission: RE | Admit: 2015-02-24 | Discharge: 2015-02-24 | Disposition: A | Discharge status: Home / Self Care | Payer: 59 | Source: Ambulatory Visit | Attending: Radiation Oncology | Admitting: Radiation Oncology

## 2015-02-24 DIAGNOSIS — C61 Malignant neoplasm of prostate: Secondary | ICD-10-CM | POA: Diagnosis not present

## 2015-02-24 DIAGNOSIS — Z510 Encounter for antineoplastic radiation therapy: Secondary | ICD-10-CM | POA: Diagnosis not present

## 2015-02-24 DIAGNOSIS — C7951 Secondary malignant neoplasm of bone: Secondary | ICD-10-CM | POA: Diagnosis not present

## 2015-02-25 ENCOUNTER — Ambulatory Visit
Admission: RE | Admit: 2015-02-25 | Discharge: 2015-02-25 | Disposition: A | Discharge status: Home / Self Care | Payer: 59 | Source: Ambulatory Visit | Attending: Radiation Oncology | Admitting: Radiation Oncology

## 2015-02-25 DIAGNOSIS — C7951 Secondary malignant neoplasm of bone: Secondary | ICD-10-CM | POA: Diagnosis not present

## 2015-02-25 DIAGNOSIS — Z510 Encounter for antineoplastic radiation therapy: Secondary | ICD-10-CM | POA: Diagnosis not present

## 2015-02-25 DIAGNOSIS — C61 Malignant neoplasm of prostate: Secondary | ICD-10-CM | POA: Diagnosis not present

## 2015-03-01 ENCOUNTER — Inpatient Hospital Stay: Payer: 59

## 2015-03-01 VITALS — BP 127/79 | HR 73 | Temp 97.9°F

## 2015-03-01 DIAGNOSIS — F17210 Nicotine dependence, cigarettes, uncomplicated: Secondary | ICD-10-CM | POA: Diagnosis not present

## 2015-03-01 DIAGNOSIS — F329 Major depressive disorder, single episode, unspecified: Secondary | ICD-10-CM | POA: Diagnosis not present

## 2015-03-01 DIAGNOSIS — C7951 Secondary malignant neoplasm of bone: Principal | ICD-10-CM

## 2015-03-01 DIAGNOSIS — Z7982 Long term (current) use of aspirin: Secondary | ICD-10-CM | POA: Diagnosis not present

## 2015-03-01 DIAGNOSIS — C61 Malignant neoplasm of prostate: Secondary | ICD-10-CM

## 2015-03-01 DIAGNOSIS — Z79899 Other long term (current) drug therapy: Secondary | ICD-10-CM | POA: Diagnosis not present

## 2015-03-01 DIAGNOSIS — Z87442 Personal history of urinary calculi: Secondary | ICD-10-CM | POA: Diagnosis not present

## 2015-03-01 DIAGNOSIS — I10 Essential (primary) hypertension: Secondary | ICD-10-CM | POA: Diagnosis not present

## 2015-03-01 DIAGNOSIS — I2510 Atherosclerotic heart disease of native coronary artery without angina pectoris: Secondary | ICD-10-CM | POA: Diagnosis not present

## 2015-03-01 MED ORDER — LEUPROLIDE ACETATE (3 MONTH) 22.5 MG IM KIT
22.5000 mg | PACK | Freq: Once | INTRAMUSCULAR | Status: AC
  Administered 2015-03-01: 22.5 mg via INTRAMUSCULAR
  Filled 2015-03-01: qty 22.5

## 2015-03-08 ENCOUNTER — Other Ambulatory Visit: Payer: Self-pay | Admitting: Internal Medicine

## 2015-03-16 ENCOUNTER — Inpatient Hospital Stay (HOSPITAL_BASED_OUTPATIENT_CLINIC_OR_DEPARTMENT_OTHER): Payer: 59 | Admitting: Internal Medicine

## 2015-03-16 ENCOUNTER — Encounter: Payer: Self-pay | Admitting: Internal Medicine

## 2015-03-16 ENCOUNTER — Inpatient Hospital Stay: Payer: 59 | Attending: Radiation Oncology

## 2015-03-16 VITALS — BP 93/66 | HR 83 | Temp 98.2°F | Resp 18 | Ht 71.0 in | Wt 190.5 lb

## 2015-03-16 DIAGNOSIS — Z923 Personal history of irradiation: Secondary | ICD-10-CM

## 2015-03-16 DIAGNOSIS — C61 Malignant neoplasm of prostate: Secondary | ICD-10-CM

## 2015-03-16 DIAGNOSIS — Z79899 Other long term (current) drug therapy: Secondary | ICD-10-CM | POA: Insufficient documentation

## 2015-03-16 DIAGNOSIS — C7951 Secondary malignant neoplasm of bone: Secondary | ICD-10-CM | POA: Diagnosis not present

## 2015-03-16 DIAGNOSIS — F17210 Nicotine dependence, cigarettes, uncomplicated: Secondary | ICD-10-CM | POA: Insufficient documentation

## 2015-03-16 DIAGNOSIS — I10 Essential (primary) hypertension: Secondary | ICD-10-CM | POA: Insufficient documentation

## 2015-03-16 LAB — COMPREHENSIVE METABOLIC PANEL
ALT: 13 U/L — AB (ref 17–63)
AST: 16 U/L (ref 15–41)
Albumin: 4 g/dL (ref 3.5–5.0)
Alkaline Phosphatase: 233 U/L — ABNORMAL HIGH (ref 38–126)
Anion gap: 4 — ABNORMAL LOW (ref 5–15)
BUN: 7 mg/dL (ref 6–20)
CALCIUM: 7.3 mg/dL — AB (ref 8.9–10.3)
CHLORIDE: 107 mmol/L (ref 101–111)
CO2: 24 mmol/L (ref 22–32)
CREATININE: 0.67 mg/dL (ref 0.61–1.24)
Glucose, Bld: 169 mg/dL — ABNORMAL HIGH (ref 65–99)
Potassium: 3.8 mmol/L (ref 3.5–5.1)
Sodium: 135 mmol/L (ref 135–145)
Total Bilirubin: 0.5 mg/dL (ref 0.3–1.2)
Total Protein: 6.6 g/dL (ref 6.5–8.1)

## 2015-03-16 LAB — CBC WITH DIFFERENTIAL/PLATELET
Basophils Absolute: 0 10*3/uL (ref 0–0.1)
Basophils Relative: 1 %
EOS PCT: 6 %
Eosinophils Absolute: 0.2 10*3/uL (ref 0–0.7)
HCT: 41.6 % (ref 40.0–52.0)
Hemoglobin: 13.9 g/dL (ref 13.0–18.0)
LYMPHS ABS: 0.9 10*3/uL — AB (ref 1.0–3.6)
LYMPHS PCT: 24 %
MCH: 29.7 pg (ref 26.0–34.0)
MCHC: 33.4 g/dL (ref 32.0–36.0)
MCV: 89 fL (ref 80.0–100.0)
MONO ABS: 0.4 10*3/uL (ref 0.2–1.0)
MONOS PCT: 10 %
Neutro Abs: 2.2 10*3/uL (ref 1.4–6.5)
Neutrophils Relative %: 59 %
PLATELETS: 162 10*3/uL (ref 150–440)
RBC: 4.67 MIL/uL (ref 4.40–5.90)
RDW: 15.2 % — ABNORMAL HIGH (ref 11.5–14.5)
WBC: 3.7 10*3/uL — ABNORMAL LOW (ref 3.8–10.6)

## 2015-03-16 NOTE — Progress Notes (Signed)
[place] OFFICE PROGRESS NOTE  Patient Care Team: Arlis Porta., MD as PCP - General (Family Medicine)   SUMMARY OF ONCOLOGIC HISTORY:  # 2011- PROSTATE CANCER [Gleason 3+4]; s/p Prostatectomy [ also involved bladder neck/ECP; Dr.Polaseck]; July 2014- Biochemical recurrence [PSA 14]- started on Zoladex [Dr.Pandit]; lost to follow up.  # JAN 2017- STAGE IV METASTATIC PROSTATE Cancer to Bone- Feb 13th, 2017- Start casodex+ Lupron q 60m [~end of feb]; PSA: 1021; Declined Chemo  # Mets to bone- start X-geva q 39M   # Smoker/ chronic pain/pain clinic   INTERVAL HISTORY:   66 year old pleasant Caucasian male patient  With above history of prostate cancer metastatic castrate sensitive currently on Lupron is here for follow-up.  Patient finished radiation to the lower back- end of February 2017.   Patient has not had no significant improvement in his pain; he continues to get his pain medication through his pain physician. He is inconsistent with taking oxycodone.  Poor appetite. Weight loss in the last few months. Intermittent nausea. No abdominal pain. No blood in stools. Overall he feels poorly. No new shortness of breath or chest pain or cough.   REVIEW OF SYSTEMS:  A complete 10 point review of system is done which is negative except mentioned above/history of present illness.   PAST MEDICAL HISTORY :  Past Medical History  Diagnosis Date  . Hypertension   . Depression   . ASCVD (arteriosclerotic cardiovascular disease)   . Back pain   . Joint pain   . Prostate cancer ([place])   . Bone cancer ([place])   . History of kidney stones   . Back pain 10/09/2012  . Unable to ambulate 10/09/2012  . SOB (shortness of breath) 10/09/2012    PAST SURGICAL HISTORY :   Past Surgical History  Procedure Laterality Date  . Cardiac catheterization      [facility]  . Wrist surgery    . Tonsillectomy    . Cervix surgery    . Prostatectomy    . Spine surgery      FAMILY HISTORY  :   Family History  Problem Relation Age of Onset  . Heart attack Mother   . Hypertension Mother   . Heart attack Father     SOCIAL HISTORY:   Social History  Substance Use Topics  . Smoking status: Heavy Tobacco Smoker -- 1.00 packs/day for 40 years    Types: Cigarettes  . Smokeless tobacco: Never Used     Comment: "I use a pack a day in 24 hours. I have quit and started in past."  . Alcohol Use: 0.6 oz/week    1 Cans of beer per week     Comment: occ. about once few months    ALLERGIES:  has No Known Allergies.  MEDICATIONS:  Current Outpatient Prescriptions  Medication Sig Dispense Refill  . acetaminophen (TYLENOL) 500 MG tablet Take 1,500 mg by mouth 2 (two) times daily.     Marland Kitchen amLODipine (NORVASC) 5 MG tablet Take 1 tablet (5 mg total) by mouth daily. 30 tablet 12  . aspirin 81 MG tablet Take 81 mg by mouth daily.    . baclofen (LIORESAL) 20 MG tablet Take 1 tablet (20 mg total) by mouth 2 (two) times daily. 60 each 12  . DULoxetine (CYMBALTA) 30 MG capsule Take 2 capsules each AM. 60 capsule 6  . ibuprofen (ADVIL,MOTRIN) 200 MG tablet Take 800 mg by mouth every 8 (eight) hours as needed.     Marland Kitchen  lidocaine-prilocaine (EMLA) cream Apply 1 application topically as needed. 30 g 3  . losartan (COZAAR) 100 MG tablet Take 1/2 tablet twice a day. 90 tablet 3  . lubiprostone (AMITIZA) 24 MCG capsule Take 1 capsule (24 mcg total) by mouth 2 (two) times daily with a meal. Swallow the medication whole. Do not break or chew the medication. 60 capsule PRN  . metoprolol succinate (TOPROL-XL) 50 MG 24 hr tablet Take 1 tablet (50 mg total) by mouth daily. Take with or immediately following a meal. 30 tablet 12  . oxyCODONE (OXYCONTIN) 20 mg 12 hr tablet Take 1 tablet (20 mg total) by mouth every 12 (twelve) hours. 60 tablet 0  . Oxycodone HCl 10 MG TABS Take 1 tablet (10 mg total) by mouth 5 (five) times daily as needed. 150 tablet 0  . Wheat Dextrin (BENEFIBER) POWD Stir 2 tsp. TID into 4-8  oz of any non-carbonated beverage or soft food (hot or cold) 500 g PRN  . bicalutamide (CASODEX) 50 MG tablet Take 1 tablet (50 mg total) by mouth daily. Start as soon as possible. 30 tablet 0   No current facility-administered medications for this visit.    PHYSICAL EXAMINATION: ECOG PERFORMANCE STATUS: 1 - Symptomatic but completely ambulatory  BP 93/66 mmHg  Pulse 83  Temp(Src) 98.2 F (36.8 C) (Tympanic)  Resp 18  Ht 5\' 11"  (1.803 m)  Wt 190 lb 7.6 oz (86.4 kg)  BMI 26.58 kg/m2  Filed Weights   03/16/15 1048  Weight: 190 lb 7.6 oz (86.4 kg)   GENERAL: Well-nourished well-developed; Alert, no distress and comfortable. Accompanied by his wife. EYES: no pallor or icterus OROPHARYNX: no thrush or ulceration; Upper dentures. NECK: supple, no masses felt LYMPH: no palpable lymphadenopathy in the cervical, axillary or inguinal regions LUNGS: clear to auscultation and No wheeze or crackles HEART/CVS: regular rate & rhythm and no murmurs; No lower extremity edema ABDOMEN:abdomen soft, non-tender and normal bowel sounds; Question hepatomegaly. Musculoskeletal:no cyanosis of digits and no clubbing  PSYCH: alert & oriented x 3 with fluent speech NEURO: no focal motor/sensory deficits SKIN: no rashes or significant lesions   LABORATORY DATA:  I have reviewed the data as listed    Component Value Date/Time   NA 136 02/08/2015 0952   NA 140 07/13/2013 1542   K 3.6 02/08/2015 0952   K 4.3 07/13/2013 1542   CL 101 02/08/2015 0952   CL 107 07/13/2013 1542   CO2 27 02/08/2015 0952   CO2 26 07/13/2013 1542   GLUCOSE 113* 02/08/2015 0952   GLUCOSE 106* 07/13/2013 1542   BUN 13 02/08/2015 0952   BUN 11 07/13/2013 1542   CREATININE 0.87 02/08/2015 0952   CREATININE 1.01 07/13/2013 1542   CALCIUM 9.1 02/08/2015 0952   CALCIUM 9.9 07/13/2013 1542   PROT 6.6 02/08/2015 0952   PROT 8.1 07/13/2013 1542   ALBUMIN 4.1 02/08/2015 0952   ALBUMIN 4.3 07/13/2013 1542   AST 22  02/08/2015 0952   AST 26 07/13/2013 1542   ALT 14* 02/08/2015 0952   ALT 32 07/13/2013 1542   ALKPHOS 281* 02/08/2015 0952   ALKPHOS 74 07/13/2013 1542   BILITOT 0.4 02/08/2015 0952   BILITOT 0.4 07/13/2013 1542   GFRNONAA >60 02/08/2015 0952   GFRNONAA >60 07/13/2013 1542   GFRAA >60 02/08/2015 0952   GFRAA >60 07/13/2013 1542    No results found for: SPEP, UPEP  Lab Results  Component Value Date   WBC 3.7* 03/16/2015   NEUTROABS  2.2 03/16/2015   HGB 13.9 03/16/2015   HCT 41.6 03/16/2015   MCV 89.0 03/16/2015   PLT 162 03/16/2015      Chemistry      Component Value Date/Time   NA 136 02/08/2015 0952   NA 140 07/13/2013 1542   K 3.6 02/08/2015 0952   K 4.3 07/13/2013 1542   CL 101 02/08/2015 0952   CL 107 07/13/2013 1542   CO2 27 02/08/2015 0952   CO2 26 07/13/2013 1542   BUN 13 02/08/2015 0952   BUN 11 07/13/2013 1542   CREATININE 0.87 02/08/2015 0952   CREATININE 1.01 07/13/2013 1542      Component Value Date/Time   CALCIUM 9.1 02/08/2015 0952   CALCIUM 9.9 07/13/2013 1542   ALKPHOS 281* 02/08/2015 0952   ALKPHOS 74 07/13/2013 1542   AST 22 02/08/2015 0952   AST 26 07/13/2013 1542   ALT 14* 02/08/2015 0952   ALT 32 07/13/2013 1542   BILITOT 0.4 02/08/2015 0952   BILITOT 0.4 07/13/2013 1542       RADIOGRAPHIC STUDIES:  There has been interval development of widespread bony metastatic disease. Increased uptake about the right acetabulum and the in the intertrochanteric region of the right hip. CT chest and pelvis -no evidence of any visceral metastases.    ASSESSMENT & PLAN:   # METASTATIC PROSTATE CANCER/hormone sensitive-  With widespread metastases to the bone. Patient is currently on first line Lupron every 3 months.  # I reviewed the data with addition of chemotherapy- median survival improves by approximately 12 months. I strongly recommend addition of chemotherapy to his antihormone therapy. I went over the potential side effects of chemotherapy  in detail. After the lengthy discussion patient is still reluctant to proceed with chemotherapy. I offered him a second opinion; which he declined.  # Back pain secondary malignancy status post radiation- no significant improvement of pain . He continues to follow up with Pain physician for pain medication.  # Hypocalcemia calcium 7.3 from Xgeva. Recommend taking calcium and vitamin D pill twice a day.  # Recommend follow-up in end of May/for Lupron/Xgeva labs-CBC CMP PSA  # 25 minutes face-to-face with the patient discussing the above plan of care; more than 50% of time spent on prognosis/ natural history; counseling and coordination.     [name], MD 03/16/2015 10:56 AM

## 2015-03-16 NOTE — Progress Notes (Signed)
Pt issued a 6 month handicap application.

## 2015-03-16 NOTE — Progress Notes (Signed)
Patient new thing he states and his wife has noticed since Nov he has dec. Appetite.  Pt explains is no appetite and he makes himself eat and it has to be small amounts because if it is more he gets bad indigestion. Constipation is long time issue and he takes 2 meds for it and still struggles at times.  He does have dizziness at times.

## 2015-03-22 ENCOUNTER — Encounter: Payer: Self-pay | Admitting: Family Medicine

## 2015-03-22 ENCOUNTER — Ambulatory Visit (INDEPENDENT_AMBULATORY_CARE_PROVIDER_SITE_OTHER): Payer: 59 | Admitting: Family Medicine

## 2015-03-22 VITALS — BP 120/80 | HR 84 | Temp 97.7°F | Resp 16 | Ht 71.0 in | Wt 190.0 lb

## 2015-03-22 DIAGNOSIS — C61 Malignant neoplasm of prostate: Secondary | ICD-10-CM | POA: Diagnosis not present

## 2015-03-22 DIAGNOSIS — M7918 Myalgia, other site: Secondary | ICD-10-CM

## 2015-03-22 DIAGNOSIS — I10 Essential (primary) hypertension: Secondary | ICD-10-CM

## 2015-03-22 DIAGNOSIS — F329 Major depressive disorder, single episode, unspecified: Secondary | ICD-10-CM | POA: Diagnosis not present

## 2015-03-22 DIAGNOSIS — I2510 Atherosclerotic heart disease of native coronary artery without angina pectoris: Secondary | ICD-10-CM

## 2015-03-22 DIAGNOSIS — G893 Neoplasm related pain (acute) (chronic): Secondary | ICD-10-CM | POA: Diagnosis not present

## 2015-03-22 DIAGNOSIS — I2584 Coronary atherosclerosis due to calcified coronary lesion: Secondary | ICD-10-CM

## 2015-03-22 DIAGNOSIS — M791 Myalgia: Secondary | ICD-10-CM | POA: Diagnosis not present

## 2015-03-22 DIAGNOSIS — F32A Depression, unspecified: Secondary | ICD-10-CM

## 2015-03-22 MED ORDER — AMLODIPINE BESYLATE 5 MG PO TABS: ORAL_TABLET | ORAL | Status: DC

## 2015-03-22 MED ORDER — DULOXETINE HCL 30 MG PO CPEP: ORAL_CAPSULE | ORAL | Status: DC

## 2015-03-22 NOTE — Progress Notes (Signed)
Name: Robert Short   MRN: [id number]    DOB: 13-May-1950   Date:03/22/2015       Progress Note  Subjective  Chief Complaint  Chief Complaint  Patient presents with  . Hypertension    HPI Here for f/u of HBP.  Has prostate cancer with extensive bony mets.  H is still smoking.  He is going to pain Management and to cancer center.  Doesn't feel low dose Cymbalta has helped much.  No problem-specific assessment & plan notes found for this encounter.   Past Medical History  Diagnosis Date  . Hypertension   . Depression   . ASCVD (arteriosclerotic cardiovascular disease)   . Back pain   . Joint pain   . Prostate cancer ([place])   . Bone cancer ([place])   . History of kidney stones   . Back pain 10/09/2012  . Unable to ambulate 10/09/2012  . SOB (shortness of breath) 10/09/2012    Past Surgical History  Procedure Laterality Date  . Cardiac catheterization      [facility]  . Wrist surgery    . Tonsillectomy    . Cervix surgery    . Prostatectomy    . Spine surgery      Family History  Problem Relation Age of Onset  . Heart attack Mother   . Hypertension Mother   . Heart attack Father     Social History   Social History  . Marital Status: Married    Spouse Name: N/A  . Number of Children: N/A  . Years of Education: N/A   Occupational History  . Not on file.   Social History Main Topics  . Smoking status: Heavy Tobacco Smoker -- 1.00 packs/day for 40 years    Types: Cigarettes  . Smokeless tobacco: Never Used     Comment: "I use a pack a day in 24 hours. I have quit and started in past."  . Alcohol Use: 0.6 oz/week    1 Cans of beer per week     Comment: occ. about once few months  . Drug Use: No  . Sexual Activity: Not on file   Other Topics Concern  . Not on file   Social History Narrative     Current outpatient prescriptions:  .  acetaminophen (TYLENOL) 500 MG tablet, Take 1,500 mg by mouth 2 (two) times daily. , Disp: , Rfl:  .  amLODipine (NORVASC) 5 MG  tablet, Take 1/2 tablet daily, Disp: 30 tablet, Rfl: 12 .  aspirin 81 MG tablet, Take 81 mg by mouth daily., Disp: , Rfl:  .  baclofen (LIORESAL) 20 MG tablet, Take 1 tablet (20 mg total) by mouth 2 (two) times daily., Disp: 60 each, Rfl: 12 .  bicalutamide (CASODEX) 50 MG tablet, Take 1 tablet (50 mg total) by mouth daily. Start as soon as possible., Disp: 30 tablet, Rfl: 0 .  DULoxetine (CYMBALTA) 30 MG capsule, Take 3 capsules each AM., Disp: 90 capsule, Rfl: 12 .  ibuprofen (ADVIL,MOTRIN) 200 MG tablet, Take 800 mg by mouth every 8 (eight) hours as needed. , Disp: , Rfl:  .  lidocaine-prilocaine (EMLA) cream, Apply 1 application topically as needed., Disp: 30 g, Rfl: 3 .  losartan (COZAAR) 100 MG tablet, Take 1/2 tablet twice a day., Disp: 90 tablet, Rfl: 3 .  lubiprostone (AMITIZA) 24 MCG capsule, Take 1 capsule (24 mcg total) by mouth 2 (two) times daily with a meal. Swallow the medication whole. Do not break or chew the  medication., Disp: 60 capsule, Rfl: PRN .  metoprolol succinate (TOPROL-XL) 50 MG 24 hr tablet, Take 1 tablet (50 mg total) by mouth daily. Take with or immediately following a meal., Disp: 30 tablet, Rfl: 12 .  oxyCODONE (OXYCONTIN) 20 mg 12 hr tablet, Take 1 tablet (20 mg total) by mouth every 12 (twelve) hours., Disp: 60 tablet, Rfl: 0 .  Oxycodone HCl 10 MG TABS, Take 1 tablet (10 mg total) by mouth 5 (five) times daily as needed., Disp: 150 tablet, Rfl: 0 .  Wheat Dextrin (BENEFIBER) POWD, Stir 2 tsp. TID into 4-8 oz of any non-carbonated beverage or soft food (hot or cold), Disp: 500 g, Rfl: PRN  Not on File   Review of Systems  Constitutional: Negative for fever, chills, weight loss and malaise/fatigue.  HENT: Negative for hearing loss.   Eyes: Negative for blurred vision and double vision.  Respiratory: Negative for cough, shortness of breath and wheezing.   Cardiovascular: Negative for chest pain, palpitations and leg swelling.  Gastrointestinal: Positive for  nausea and vomiting (rare). Negative for heartburn, abdominal pain and blood in stool.  Genitourinary: Positive for frequency. Negative for dysuria and urgency.  Musculoskeletal: Positive for myalgias, back pain and joint pain.  Skin: Negative for rash.  Neurological: Negative for weakness and headaches.      Objective  Filed Vitals:   03/22/15 1526  BP: 120/80  Pulse: 84  Temp: 97.7 F (36.5 C)  TempSrc: Oral  Resp: 16  Height: '5\' 11"'  (1.803 m)  Weight: 190 lb (86.183 kg)  SpO2: 100%    Physical Exam  Constitutional: He is oriented to person, place, and time and well-developed, well-nourished, and in no distress. Distressed: Moves slowly secondayr to bone pain.  HENT:  Head: Normocephalic and atraumatic.  Eyes: Conjunctivae and EOM are normal. Pupils are equal, round, and reactive to light. No scleral icterus.  Neck: Normal range of motion. Neck supple. Carotid bruit is not present. No thyromegaly present.  Cardiovascular: Normal rate, regular rhythm and normal heart sounds.  Exam reveals no gallop and no friction rub.   No murmur heard. Pulmonary/Chest: Effort normal and breath sounds normal. No respiratory distress. He has no wheezes. He has no rales.  Musculoskeletal: He exhibits no edema.  Lymphadenopathy:    He has no cervical adenopathy.  Neurological: He is alert and oriented to person, place, and time.  Vitals reviewed.      Recent Results (from the past 2160 hour(s))  CBC     Status: None   Collection Time: 02/04/15 10:50 AM  Result Value Ref Range   WBC 7.1 3.8 - 10.6 K/uL   RBC 5.04 4.40 - 5.90 MIL/uL   Hemoglobin 14.8 13.0 - 18.0 g/dL   HCT 43.8 40.0 - 52.0 %   MCV 86.8 80.0 - 100.0 fL   MCH 29.4 26.0 - 34.0 pg   MCHC 33.9 32.0 - 36.0 g/dL   RDW 13.7 11.5 - 14.5 %   Platelets 164 150 - 440 K/uL  ToxASSURE Select 13 (MW), Urine     Status: None   Collection Time: 02/04/15  1:25 PM  Result Value Ref Range   Report Summary FINAL     Comment:  ==================================================================== TOXASSURE SELECT 13 (MW) ==================================================================== Test                             Result       Flag  Units Drug Present and Declared for Prescription Verification   Oxycodone                      >2681        EXPECTED   ng/mg creat   Oxymorphone                    1014         EXPECTED   ng/mg creat   Noroxycodone                   >2681        EXPECTED   ng/mg creat   Noroxymorphone                 469          EXPECTED   ng/mg creat    Sources of oxycodone are scheduled prescription medications.    Oxymorphone, noroxycodone, and noroxymorphone are expected    metabolites of oxycodone. Oxymorphone is also available as a    scheduled prescription medication. ==================================================================== Test                      Result    Flag   Units      Ref Range   Creatinine              373              mg/dL      >=20 ====== ============================================================== Declared Medications:  The flagging and interpretation on this report are based on the  following declared medications.  Unexpected results may arise from  inaccuracies in the declared medications.  **Note: The testing scope of this panel includes these medications:  Oxycodone  **Note: The testing scope of this panel does not include following  reported medications:  Duloxetine (Cymbalta) ==================================================================== For clinical consultation, please call [phone]. ====================================================================    PDF .   CBC with Differential     Status: None   Collection Time: 02/08/15  9:52 AM  Result Value Ref Range   WBC 7.5 3.8 - 10.6 K/uL   RBC 4.94 4.40 - 5.90 MIL/uL   Hemoglobin 14.7 13.0 - 18.0 g/dL   HCT 43.2 40.0 - 52.0 %   MCV 87.4 80.0 - 100.0 fL   MCH 29.8 26.0 - 34.0  pg   MCHC 34.0 32.0 - 36.0 g/dL   RDW 13.6 11.5 - 14.5 %   Platelets 177 150 - 440 K/uL   Neutrophils Relative % 60 %   Neutro Abs 4.5 1.4 - 6.5 K/uL   Lymphocytes Relative 26 %   Lymphs Abs 2.0 1.0 - 3.6 K/uL   Monocytes Relative 10 %   Monocytes Absolute 0.7 0.2 - 1.0 K/uL   Eosinophils Relative 3 %   Eosinophils Absolute 0.2 0 - 0.7 K/uL   Basophils Relative 1 %   Basophils Absolute 0.0 0 - 0.1 K/uL  Comprehensive metabolic panel     Status: Abnormal   Collection Time: 02/08/15  9:52 AM  Result Value Ref Range   Sodium 136 135 - 145 mmol/L   Potassium 3.6 3.5 - 5.1 mmol/L   Chloride 101 101 - 111 mmol/L   CO2 27 22 - 32 mmol/L   Glucose, Bld 113 (H) 65 - 99 mg/dL   BUN 13 6 - 20 mg/dL   Creatinine, Ser 0.87 0.61 - 1.24 mg/dL   Calcium 9.1 8.9 - 10.3  mg/dL   Total Protein 6.6 6.5 - 8.1 g/dL   Albumin 4.1 3.5 - 5.0 g/dL   AST 22 15 - 41 U/L   ALT 14 (L) 17 - 63 U/L   Alkaline Phosphatase 281 (H) 38 - 126 U/L   Total Bilirubin 0.4 0.3 - 1.2 mg/dL   GFR calc non Af Amer >60 >60 mL/min   GFR calc Af Amer >60 >60 mL/min    Comment: (NOTE) The eGFR has been calculated using the CKD EPI equation. This calculation has not been validated in all clinical situations. eGFR's persistently <60 mL/min signify possible Chronic Kidney Disease.    Anion gap 8 5 - 15  PSA     Status: Abnormal   Collection Time: 02/08/15  9:52 AM  Result Value Ref Range   PSA 1021.00 (H) 0.00 - 4.00 ng/mL    Comment: (NOTE) While PSA levels of <=4.0 ng/ml are reported as reference range, some men with levels below 4.0 ng/ml can have prostate cancer and many men with PSA above 4.0 ng/ml do not have prostate cancer.  Other tests such as free PSA, age specific reference ranges, PSA velocity and PSA doubling time may be helpful especially in men less than 47 years old. Performed at [facility]   Testosterone     Status: Abnormal   Collection Time: 02/08/15  9:52 AM  Result Value Ref Range    Testosterone 283 (L) 348 - 1197 ng/dL   Comment, Testosterone Comment     Comment: (NOTE) Adult male reference interval is based on a population of lean males up to 64 years old. Performed At: [facility] [place], [place] [id number] Lindon Romp MD SA:[id number]   CBC with Differential     Status: Abnormal   Collection Time: 03/16/15 10:32 AM  Result Value Ref Range   WBC 3.7 (L) 3.8 - 10.6 K/uL   RBC 4.67 4.40 - 5.90 MIL/uL   Hemoglobin 13.9 13.0 - 18.0 g/dL   HCT 41.6 40.0 - 52.0 %   MCV 89.0 80.0 - 100.0 fL   MCH 29.7 26.0 - 34.0 pg   MCHC 33.4 32.0 - 36.0 g/dL   RDW 15.2 (H) 11.5 - 14.5 %   Platelets 162 150 - 440 K/uL   Neutrophils Relative % 59 %   Neutro Abs 2.2 1.4 - 6.5 K/uL   Lymphocytes Relative 24 %   Lymphs Abs 0.9 (L) 1.0 - 3.6 K/uL   Monocytes Relative 10 %   Monocytes Absolute 0.4 0.2 - 1.0 K/uL   Eosinophils Relative 6 %   Eosinophils Absolute 0.2 0 - 0.7 K/uL   Basophils Relative 1 %   Basophils Absolute 0.0 0 - 0.1 K/uL  Comprehensive metabolic panel     Status: Abnormal   Collection Time: 03/16/15 10:32 AM  Result Value Ref Range   Sodium 135 135 - 145 mmol/L   Potassium 3.8 3.5 - 5.1 mmol/L   Chloride 107 101 - 111 mmol/L   CO2 24 22 - 32 mmol/L   Glucose, Bld 169 (H) 65 - 99 mg/dL   BUN 7 6 - 20 mg/dL   Creatinine, Ser 0.67 0.61 - 1.24 mg/dL   Calcium 7.3 (L) 8.9 - 10.3 mg/dL   Total Protein 6.6 6.5 - 8.1 g/dL   Albumin 4.0 3.5 - 5.0 g/dL   AST 16 15 - 41 U/L   ALT 13 (L) 17 - 63 U/L   Alkaline Phosphatase 233 (H) 38 -  126 U/L   Total Bilirubin 0.5 0.3 - 1.2 mg/dL   GFR calc non Af Amer >60 >60 mL/min   GFR calc Af Amer >60 >60 mL/min    Comment: (NOTE) The eGFR has been calculated using the CKD EPI equation. This calculation has not been validated in all clinical situations. eGFR's persistently <60 mL/min signify possible Chronic Kidney Disease.    Anion gap 4 (L) 5 - 15     Assessment & Plan  Problem  List Items Addressed This Visit      Cardiovascular and Mediastinum   CAD (coronary artery disease)   Relevant Medications   amLODipine (NORVASC) 5 MG tablet   Essential (primary) hypertension - Primary   Relevant Medications   amLODipine (NORVASC) 5 MG tablet     Genitourinary   Malignant neoplasm of prostate (HCC)   Prostate cancer (Chronic)     Other   Clinical depression   Relevant Medications   DULoxetine (CYMBALTA) 30 MG capsule   Cancer-related pain (Chronic)   Relevant Medications   DULoxetine (CYMBALTA) 30 MG capsule   Musculoskeletal pain (Chronic)      Meds ordered this encounter  Medications  . amLODipine (NORVASC) 5 MG tablet    Sig: Take 1/2 tablet daily    Dispense:  30 tablet    Refill:  12  . DULoxetine (CYMBALTA) 30 MG capsule    Sig: Take 3 capsules each AM.    Dispense:  90 capsule    Refill:  12   1. Essential (primary) hypertension Cont. Losartan and Metoprolol - amLODipine (NORVASC) 5 MG tablet; Take 1/2 tablet daily  Dispense: 30 tablet; Refill: 12  2. Coronary artery disease due to calcified coronary lesion   3. Prostate cancer   4. Malignant neoplasm of prostate (HCC) Cont. Pain  center  5. Cancer-related pain Cont. Cancer center - DULoxetine (CYMBALTA) 30 MG capsule; Take 3 capsules each AM.  Dispense: 90 capsule; Refill: 12  6. Musculoskeletal pain   7. Clinical depression  - DULoxetine (CYMBALTA) 30 MG capsule; Take 3 capsules each AM.  Dispense: 90 capsule; Refill: 12

## 2015-03-31 ENCOUNTER — Encounter: Payer: Self-pay | Admitting: Radiation Oncology

## 2015-03-31 ENCOUNTER — Ambulatory Visit
Admission: RE | Admit: 2015-03-31 | Discharge: 2015-03-31 | Disposition: A | Discharge status: Home / Self Care | Payer: 59 | Source: Ambulatory Visit | Attending: Radiation Oncology | Admitting: Radiation Oncology

## 2015-03-31 VITALS — BP 138/86 | HR 79 | Temp 97.6°F | Resp 18 | Wt 190.3 lb

## 2015-03-31 DIAGNOSIS — C7951 Secondary malignant neoplasm of bone: Secondary | ICD-10-CM | POA: Diagnosis not present

## 2015-03-31 DIAGNOSIS — C61 Malignant neoplasm of prostate: Secondary | ICD-10-CM | POA: Diagnosis not present

## 2015-03-31 NOTE — Progress Notes (Signed)
Radiation Oncology Follow up Note  Name: Robert Short   Date:   03/31/2015 MRN:  [id number] DOB: 07-Dec-1950    This 65 y.o. male presents to the clinic today for follow-up for stage IV prostate cancer.  REFERRING PROVIDER: Arlis Porta., MD  HPI: Patient is a 65 year old male with stage IV prostate cancer originally status post prostatectomy in 2011 for Gleason 7 (3+4) adenocarcinoma of the prostate involving the bladder neck with biochemical recurrence. In July 2014. He was started on case adduction Lupron declined chemotherapy has been started on X Geva every 3 months. I recently completed palliative radiation therapy to his SI joints as well as a single fraction of 800 cGy to his right proximal femur and acetabulum. He is doing poor. He continues to have significant pain his bone scan shows widespread axial metastasis. He is again discussed with any chemotherapy options and has declined chemotherapy.  COMPLICATIONS OF TREATMENT: none  FOLLOW UP COMPLIANCE: keeps appointments   PHYSICAL EXAM:  BP 138/86 mmHg  Pulse 79  Temp(Src) 97.6 F (36.4 C)  Resp 18  Wt 190 lb 4.1 oz (86.3 kg) Range of motion of his lower extremities does not elicit pain. Deep palpation of his spine and sacrum does not elicit pain. Motor and sensory levels are equal and symmetric in the lower extremities bilaterally. Well-developed well-nourished patient in NAD. HEENT reveals PERLA, EOMI, discs not visualized.  Oral cavity is clear. No oral mucosal lesions are identified. Neck is clear without evidence of cervical or supraclavicular adenopathy. Lungs are clear to A&P. Cardiac examination is essentially unremarkable with regular rate and rhythm without murmur rub or thrill. Abdomen is benign with no organomegaly or masses noted. Motor sensory and DTR levels are equal and symmetric in the upper and lower extremities. Cranial nerves II through XII are grossly intact. Proprioception is intact. No peripheral  adenopathy or edema is identified. No motor or sensory levels are noted. Crude visual fields are within normal range.  RADIOLOGY RESULTS: Bone scan is again reviewed compatible above-stated findings  PLAN: At this time we discussed proceeding withXofigo. I will again discuss this with medical oncology but see no reason not to go ahead with 6 injections over the next 6 months. Rationale for treatment was explained to the patient and his wife. Risks and benefits of treatment including possible bone marrow suppression allergic reaction nausea vomiting all were discussed as potential side effects. We will arrange for his first injection over the next 4 weeks.  I would like to take this opportunity for allowing me to participate in the care of your patient.Armstead Peaks., MD

## 2015-04-06 ENCOUNTER — Inpatient Hospital Stay: Payer: 59 | Attending: Radiation Oncology

## 2015-04-06 ENCOUNTER — Telehealth: Payer: Self-pay | Admitting: Internal Medicine

## 2015-04-06 ENCOUNTER — Other Ambulatory Visit: Payer: Self-pay | Admitting: Internal Medicine

## 2015-04-06 ENCOUNTER — Telehealth: Payer: Self-pay | Admitting: *Deleted

## 2015-04-06 DIAGNOSIS — I2510 Atherosclerotic heart disease of native coronary artery without angina pectoris: Secondary | ICD-10-CM | POA: Insufficient documentation

## 2015-04-06 DIAGNOSIS — C7951 Secondary malignant neoplasm of bone: Secondary | ICD-10-CM | POA: Insufficient documentation

## 2015-04-06 DIAGNOSIS — F419 Anxiety disorder, unspecified: Secondary | ICD-10-CM | POA: Diagnosis not present

## 2015-04-06 DIAGNOSIS — M25551 Pain in right hip: Secondary | ICD-10-CM | POA: Diagnosis not present

## 2015-04-06 DIAGNOSIS — Z87442 Personal history of urinary calculi: Secondary | ICD-10-CM | POA: Diagnosis not present

## 2015-04-06 DIAGNOSIS — I10 Essential (primary) hypertension: Secondary | ICD-10-CM | POA: Insufficient documentation

## 2015-04-06 DIAGNOSIS — F329 Major depressive disorder, single episode, unspecified: Secondary | ICD-10-CM | POA: Diagnosis not present

## 2015-04-06 DIAGNOSIS — R634 Abnormal weight loss: Secondary | ICD-10-CM | POA: Insufficient documentation

## 2015-04-06 DIAGNOSIS — Z79899 Other long term (current) drug therapy: Secondary | ICD-10-CM | POA: Insufficient documentation

## 2015-04-06 DIAGNOSIS — F17210 Nicotine dependence, cigarettes, uncomplicated: Secondary | ICD-10-CM | POA: Diagnosis not present

## 2015-04-06 DIAGNOSIS — G893 Neoplasm related pain (acute) (chronic): Secondary | ICD-10-CM | POA: Insufficient documentation

## 2015-04-06 DIAGNOSIS — C61 Malignant neoplasm of prostate: Secondary | ICD-10-CM | POA: Insufficient documentation

## 2015-04-06 DIAGNOSIS — Z7982 Long term (current) use of aspirin: Secondary | ICD-10-CM | POA: Insufficient documentation

## 2015-04-06 DIAGNOSIS — N390 Urinary tract infection, site not specified: Secondary | ICD-10-CM

## 2015-04-06 DIAGNOSIS — Z923 Personal history of irradiation: Secondary | ICD-10-CM | POA: Diagnosis not present

## 2015-04-06 LAB — URINALYSIS COMPLETE WITH MICROSCOPIC (ARMC ONLY)
BILIRUBIN URINE: NEGATIVE
GLUCOSE, UA: NEGATIVE mg/dL
Hgb urine dipstick: NEGATIVE
Ketones, ur: NEGATIVE mg/dL
Nitrite: POSITIVE — AB
Protein, ur: NEGATIVE mg/dL
Specific Gravity, Urine: 1.015 (ref 1.005–1.030)
pH: 7 (ref 5.0–8.0)

## 2015-04-06 MED ORDER — CIPROFLOXACIN HCL 500 MG PO TABS: 500.0000 mg | ORAL_TABLET | Freq: Two times a day (BID) | ORAL | Status: DC

## 2015-04-06 NOTE — Telephone Encounter (Signed)
-----   Message from [name], MD sent at 04/06/2015  4:56 PM EDT ----- Recommend Ciprofloxacin 500 mg BID x7 days. Awaiting fr culture. Please inform pt.

## 2015-04-06 NOTE — Telephone Encounter (Signed)
Patient's wife came to desk saying that they fear he may have a UTI based on his symptoms. Urine has unusual smell, dark color, hurting in his lower back. They want to know if he should have a urine analysis or anything. Please advise. Contact patient's wife Caren Griffins at [phone]. Thanks.

## 2015-04-06 NOTE — Telephone Encounter (Signed)
RN spoke with wife. Pt can come this afternoon for u/a C&S.

## 2015-04-06 NOTE — Telephone Encounter (Signed)
Check UA and urine culture- Thx

## 2015-04-07 NOTE — Telephone Encounter (Signed)
left msg that phone cipro was called into pt's pharmacy

## 2015-04-08 LAB — URINE CULTURE

## 2015-04-09 ENCOUNTER — Telehealth: Payer: Self-pay | Admitting: *Deleted

## 2015-04-09 ENCOUNTER — Other Ambulatory Visit: Payer: Self-pay | Admitting: *Deleted

## 2015-04-09 DIAGNOSIS — C7951 Secondary malignant neoplasm of bone: Principal | ICD-10-CM

## 2015-04-09 DIAGNOSIS — C61 Malignant neoplasm of prostate: Secondary | ICD-10-CM

## 2015-04-09 NOTE — Telephone Encounter (Signed)
Mrs. Cazenave informed about lab appointment on 04/19/15 at 1:30 and get weight obtained.  Also xofigo appointment on 04/28/15 at 10:00.  Wife acknowledged appointment dates and times with read back performed.

## 2015-04-19 ENCOUNTER — Inpatient Hospital Stay: Payer: 59

## 2015-04-20 ENCOUNTER — Inpatient Hospital Stay: Payer: 59

## 2015-04-20 DIAGNOSIS — C7951 Secondary malignant neoplasm of bone: Secondary | ICD-10-CM | POA: Diagnosis not present

## 2015-04-20 DIAGNOSIS — Z923 Personal history of irradiation: Secondary | ICD-10-CM | POA: Diagnosis not present

## 2015-04-20 DIAGNOSIS — C61 Malignant neoplasm of prostate: Secondary | ICD-10-CM

## 2015-04-20 DIAGNOSIS — M25551 Pain in right hip: Secondary | ICD-10-CM | POA: Diagnosis not present

## 2015-04-20 DIAGNOSIS — R634 Abnormal weight loss: Secondary | ICD-10-CM | POA: Diagnosis not present

## 2015-04-20 DIAGNOSIS — G893 Neoplasm related pain (acute) (chronic): Secondary | ICD-10-CM | POA: Diagnosis not present

## 2015-04-20 DIAGNOSIS — F17210 Nicotine dependence, cigarettes, uncomplicated: Secondary | ICD-10-CM | POA: Diagnosis not present

## 2015-04-20 DIAGNOSIS — Z79899 Other long term (current) drug therapy: Secondary | ICD-10-CM | POA: Diagnosis not present

## 2015-04-20 DIAGNOSIS — F419 Anxiety disorder, unspecified: Secondary | ICD-10-CM | POA: Diagnosis not present

## 2015-04-20 LAB — CBC WITH DIFFERENTIAL/PLATELET
BASOS ABS: 0 10*3/uL (ref 0–0.1)
Basophils Relative: 1 %
Eosinophils Absolute: 0.2 10*3/uL (ref 0–0.7)
Eosinophils Relative: 5 %
HEMATOCRIT: 39.3 % — AB (ref 40.0–52.0)
Hemoglobin: 13.5 g/dL (ref 13.0–18.0)
LYMPHS ABS: 1 10*3/uL (ref 1.0–3.6)
LYMPHS PCT: 25 %
MCH: 30.7 pg (ref 26.0–34.0)
MCHC: 34.4 g/dL (ref 32.0–36.0)
MCV: 89.5 fL (ref 80.0–100.0)
Monocytes Absolute: 0.4 10*3/uL (ref 0.2–1.0)
Monocytes Relative: 11 %
NEUTROS ABS: 2.3 10*3/uL (ref 1.4–6.5)
Neutrophils Relative %: 58 %
Platelets: 134 10*3/uL — ABNORMAL LOW (ref 150–440)
RBC: 4.4 MIL/uL (ref 4.40–5.90)
RDW: 15.2 % — AB (ref 11.5–14.5)
WBC: 3.9 10*3/uL (ref 3.8–10.6)

## 2015-04-26 ENCOUNTER — Telehealth: Payer: Self-pay | Admitting: Internal Medicine

## 2015-04-26 NOTE — Telephone Encounter (Signed)
He needs refill on pain meds. He ran out of the one he takes twice per day over a week ago. (She couldn't remember what it is called.) One of them Dr. B prescribed and the other the pain clinic prescribed. Wife Caren Griffins says he needs both oxycontin and oxycodone. Can they do this without him having to come in for an appt?

## 2015-04-26 NOTE — Telephone Encounter (Signed)
Patient's wife came to my desk in [place] and requested that I book an appt for her husband tomorrow. Dr. B has an open space at 10:45. Is it ok for me to book him? She said his pain is not under control.

## 2015-04-26 NOTE — Telephone Encounter (Signed)
I called and spoke with pain clinic who said he has an appt 5/2 for med refills. I explained that he needs meds before then and that he has cancer in the bones, so they are going to call him to have him come in for med refill today if he agrees to appt

## 2015-04-27 ENCOUNTER — Encounter: Payer: 59 | Admitting: Pain Medicine

## 2015-04-27 ENCOUNTER — Inpatient Hospital Stay (HOSPITAL_BASED_OUTPATIENT_CLINIC_OR_DEPARTMENT_OTHER): Payer: 59 | Admitting: Internal Medicine

## 2015-04-27 ENCOUNTER — Inpatient Hospital Stay: Payer: 59

## 2015-04-27 VITALS — BP 163/85 | HR 76 | Temp 97.5°F | Wt 201.7 lb

## 2015-04-27 DIAGNOSIS — F419 Anxiety disorder, unspecified: Secondary | ICD-10-CM

## 2015-04-27 DIAGNOSIS — G893 Neoplasm related pain (acute) (chronic): Secondary | ICD-10-CM

## 2015-04-27 DIAGNOSIS — F17210 Nicotine dependence, cigarettes, uncomplicated: Secondary | ICD-10-CM

## 2015-04-27 DIAGNOSIS — C61 Malignant neoplasm of prostate: Secondary | ICD-10-CM

## 2015-04-27 DIAGNOSIS — C7951 Secondary malignant neoplasm of bone: Secondary | ICD-10-CM | POA: Diagnosis not present

## 2015-04-27 DIAGNOSIS — Z923 Personal history of irradiation: Secondary | ICD-10-CM

## 2015-04-27 DIAGNOSIS — Z79899 Other long term (current) drug therapy: Secondary | ICD-10-CM

## 2015-04-27 DIAGNOSIS — M25551 Pain in right hip: Secondary | ICD-10-CM

## 2015-04-27 DIAGNOSIS — R634 Abnormal weight loss: Secondary | ICD-10-CM

## 2015-04-27 DIAGNOSIS — G8929 Other chronic pain: Secondary | ICD-10-CM

## 2015-04-27 LAB — COMPREHENSIVE METABOLIC PANEL
ALBUMIN: 3.9 g/dL (ref 3.5–5.0)
ALK PHOS: 84 U/L (ref 38–126)
ALT: 19 U/L (ref 17–63)
ANION GAP: 3 — AB (ref 5–15)
AST: 20 U/L (ref 15–41)
BILIRUBIN TOTAL: 0.5 mg/dL (ref 0.3–1.2)
BUN: 10 mg/dL (ref 6–20)
CALCIUM: 8.4 mg/dL — AB (ref 8.9–10.3)
CO2: 27 mmol/L (ref 22–32)
CREATININE: 0.61 mg/dL (ref 0.61–1.24)
Chloride: 108 mmol/L (ref 101–111)
GFR calc Af Amer: >60 mL/min (ref >60)
GFR calc non Af Amer: >60 mL/min (ref >60)
GLUCOSE: 146 mg/dL — AB (ref 65–99)
Potassium: 4 mmol/L (ref 3.5–5.1)
SODIUM: 138 mmol/L (ref 135–145)
TOTAL PROTEIN: 6.5 g/dL (ref 6.5–8.1)

## 2015-04-27 LAB — CBC WITH DIFFERENTIAL/PLATELET
BASOS PCT: 1 %
Basophils Absolute: 0 10*3/uL (ref 0–0.1)
Eosinophils Absolute: 0.2 10*3/uL (ref 0–0.7)
Eosinophils Relative: 5 %
HEMATOCRIT: 40 % (ref 40.0–52.0)
Hemoglobin: 13.5 g/dL (ref 13.0–18.0)
Lymphocytes Relative: 20 %
Lymphs Abs: 0.8 10*3/uL — ABNORMAL LOW (ref 1.0–3.6)
MCH: 30.2 pg (ref 26.0–34.0)
MCHC: 33.6 g/dL (ref 32.0–36.0)
MCV: 89.9 fL (ref 80.0–100.0)
MONO ABS: 0.5 10*3/uL (ref 0.2–1.0)
MONOS PCT: 12 %
NEUTROS ABS: 2.4 10*3/uL (ref 1.4–6.5)
Neutrophils Relative %: 62 %
Platelets: 127 10*3/uL — ABNORMAL LOW (ref 150–440)
RBC: 4.45 MIL/uL (ref 4.40–5.90)
RDW: 15.5 % — ABNORMAL HIGH (ref 11.5–14.5)
WBC: 3.9 10*3/uL (ref 3.8–10.6)

## 2015-04-27 MED ORDER — OXYCODONE HCL ER 40 MG PO T12A: 40.0000 mg | EXTENDED_RELEASE_TABLET | Freq: Two times a day (BID) | ORAL | Status: DC

## 2015-04-27 MED ORDER — OXYCODONE HCL 10 MG PO TABS: ORAL_TABLET | ORAL | Status: DC

## 2015-04-27 MED ORDER — OXYCODONE HCL 10 MG PO TABS
ORAL_TABLET | ORAL | Status: DC
Start: 2015-04-27 — End: 2015-04-27

## 2015-04-27 MED ORDER — DEXAMETHASONE 4 MG PO TABS: ORAL_TABLET | ORAL | Status: DC

## 2015-04-27 NOTE — Progress Notes (Signed)
[place] OFFICE PROGRESS NOTE  Patient Care Team: Arlis Porta., MD as PCP - General (Family Medicine)   SUMMARY OF ONCOLOGIC HISTORY:  # 2011- PROSTATE CANCER [Gleason 3+4]; s/p Prostatectomy [ also involved bladder neck/ECP; Dr.Polaseck]; July 2014- Biochemical recurrence [PSA 14]- started on Zoladex [Dr.Pandit]; lost to follow up.  # JAN 2017- STAGE IV METASTATIC PROSTATE Cancer to Bone- Feb 13th, 2017- Start casodex+ Lupron q 71m [~end of feb]; PSA: 1021; Declined Chemo  # Mets to bone- start X-geva q 1M   # Smoker/ chronic pain/pain clinic   INTERVAL HISTORY:   [name]  With above history of prostate cancer metastatic castrate sensitive currently on Lupron is here for follow-up.  Patient finished radiation to the lower back- end of February 2017. Patient denies any improvement of his pain. In fact he thinks the pain has gotten worse. His currently on 20 twice a day OxyContin; 10 of oxycodone 4-5 pills a day. The pain is getting worse. He states the pain is in the bilateral hips; more so on the right side compared to the left radiating to the leg. Is able to walk. Denies any   Complains of hot flashes. Poor appetite. Weight loss in the last few months. Intermittent nausea. No abdominal pain. No blood in stools. Overall he feels poorly. No new shortness of breath or chest pain or cough.   REVIEW OF SYSTEMS:  A complete 10 point review of system is done which is negative except mentioned above/history of present illness.   PAST MEDICAL HISTORY :  Past Medical History  Diagnosis Date  . Hypertension   . Depression   . ASCVD (arteriosclerotic cardiovascular disease)   . Back pain   . Joint pain   . Prostate cancer ([place])   . Bone cancer ([place])   . History of kidney stones   . Back pain 10/09/2012  . Unable to ambulate 10/09/2012  . SOB (shortness of breath) 10/09/2012    PAST SURGICAL HISTORY :   Past Surgical History   Procedure Laterality Date  . Cardiac catheterization      [facility]  . Wrist surgery    . Tonsillectomy    . Cervix surgery    . Prostatectomy    . Spine surgery      FAMILY HISTORY :   Family History  Problem Relation Age of Onset  . Heart attack Mother   . Hypertension Mother   . Heart attack Father     SOCIAL HISTORY:   Social History  Substance Use Topics  . Smoking status: Heavy Tobacco Smoker -- 1.00 packs/day for 40 years    Types: Cigarettes  . Smokeless tobacco: Never Used     Comment: "I use a pack a day in 24 hours. I have quit and started in past."  . Alcohol Use: 0.6 oz/week    1 Cans of beer per week     Comment: occ. about once few months    ALLERGIES:  has no allergies on file.  MEDICATIONS:  Current Outpatient Prescriptions  Medication Sig Dispense Refill  . amLODipine (NORVASC) 5 MG tablet Take 1/2 tablet daily 30 tablet 12  . aspirin 81 MG tablet Take 81 mg by mouth daily.    . baclofen (LIORESAL) 20 MG tablet Take 1 tablet (20 mg total) by mouth 2 (two) times daily. 60 each 12  . ibuprofen (ADVIL,MOTRIN) 200 MG tablet Take 800 mg by mouth every 8 (eight) hours as  needed.     Marland Kitchen losartan (COZAAR) 100 MG tablet Take 1/2 tablet twice a day. 90 tablet 3  . lubiprostone (AMITIZA) 24 MCG capsule Take 1 capsule (24 mcg total) by mouth 2 (two) times daily with a meal. Swallow the medication whole. Do not break or chew the medication. 60 capsule PRN  . metoprolol succinate (TOPROL-XL) 50 MG 24 hr tablet Take 1 tablet (50 mg total) by mouth daily. Take with or immediately following a meal. 30 tablet 12  . oxyCODONE (OXYCONTIN) 20 mg 12 hr tablet Take 1 tablet (20 mg total) by mouth every 12 (twelve) hours. 60 tablet 0  . Oxycodone HCl 10 MG TABS Take 1 tablet (10 mg total) by mouth 5 (five) times daily as needed. 150 tablet 0  . Wheat Dextrin (BENEFIBER) POWD Stir 2 tsp. TID into 4-8 oz of any non-carbonated beverage or soft food (hot or cold) 500 g PRN  .  acetaminophen (TYLENOL) 500 MG tablet Take 1,500 mg by mouth 2 (two) times daily.      No current facility-administered medications for this visit.    PHYSICAL EXAMINATION: ECOG PERFORMANCE STATUS: 1 - Symptomatic but completely ambulatory  BP 163/85 mmHg  Pulse 76  Temp(Src) 97.5 F (36.4 C) (Tympanic)  Wt 201 lb 11.5 oz (91.5 kg)  Filed Weights   04/27/15 1113  Weight: 201 lb 11.5 oz (91.5 kg)   GENERAL: Well-nourished well-developed; Alert, no distress and comfortable. Accompanied by his wife. EYES: no pallor or icterus OROPHARYNX: no thrush or ulceration; Upper dentures. NECK: supple, no masses felt LYMPH: no palpable lymphadenopathy in the cervical, axillary or inguinal regions LUNGS: clear to auscultation and No wheeze or crackles HEART/CVS: regular rate & rhythm and no murmurs; No lower extremity edema ABDOMEN:abdomen soft, non-tender and normal bowel sounds; No hepatomegaly. Musculoskeletal:no cyanosis of digits and no clubbing; No significant tenderness noted. PSYCH: alert & oriented x 3 with fluent speech NEURO: no focal motor/sensory deficits SKIN: no rashes or significant lesions   LABORATORY DATA:  I have reviewed the data as listed    Component Value Date/Time   NA 135 03/16/2015 1032   NA 140 07/13/2013 1542   K 3.8 03/16/2015 1032   K 4.3 07/13/2013 1542   CL 107 03/16/2015 1032   CL 107 07/13/2013 1542   CO2 24 03/16/2015 1032   CO2 26 07/13/2013 1542   GLUCOSE 169* 03/16/2015 1032   GLUCOSE 106* 07/13/2013 1542   BUN 7 03/16/2015 1032   BUN 11 07/13/2013 1542   CREATININE 0.67 03/16/2015 1032   CREATININE 1.01 07/13/2013 1542   CALCIUM 7.3* 03/16/2015 1032   CALCIUM 9.9 07/13/2013 1542   PROT 6.6 03/16/2015 1032   PROT 8.1 07/13/2013 1542   ALBUMIN 4.0 03/16/2015 1032   ALBUMIN 4.3 07/13/2013 1542   AST 16 03/16/2015 1032   AST 26 07/13/2013 1542   ALT 13* 03/16/2015 1032   ALT 32 07/13/2013 1542   ALKPHOS 233* 03/16/2015 1032    ALKPHOS 74 07/13/2013 1542   BILITOT 0.5 03/16/2015 1032   BILITOT 0.4 07/13/2013 1542   GFRNONAA >60 03/16/2015 1032   GFRNONAA >60 07/13/2013 1542   GFRAA >60 03/16/2015 1032   GFRAA >60 07/13/2013 1542    No results found for: SPEP, UPEP  Lab Results  Component Value Date   WBC 3.9 04/20/2015   NEUTROABS 2.3 04/20/2015   HGB 13.5 04/20/2015   HCT 39.3* 04/20/2015   MCV 89.5 04/20/2015   PLT 134* 04/20/2015  Chemistry      Component Value Date/Time   NA 135 03/16/2015 1032   NA 140 07/13/2013 1542   K 3.8 03/16/2015 1032   K 4.3 07/13/2013 1542   CL 107 03/16/2015 1032   CL 107 07/13/2013 1542   CO2 24 03/16/2015 1032   CO2 26 07/13/2013 1542   BUN 7 03/16/2015 1032   BUN 11 07/13/2013 1542   CREATININE 0.67 03/16/2015 1032   CREATININE 1.01 07/13/2013 1542      Component Value Date/Time   CALCIUM 7.3* 03/16/2015 1032   CALCIUM 9.9 07/13/2013 1542   ALKPHOS 233* 03/16/2015 1032   ALKPHOS 74 07/13/2013 1542   AST 16 03/16/2015 1032   AST 26 07/13/2013 1542   ALT 13* 03/16/2015 1032   ALT 32 07/13/2013 1542   BILITOT 0.5 03/16/2015 1032   BILITOT 0.4 07/13/2013 1542       RADIOGRAPHIC STUDIES:  There has been interval development of widespread bony metastatic disease. Increased uptake about the right acetabulum and the in the intertrochanteric region of the right hip. CT chest and pelvis -no evidence of any visceral metastases.    ASSESSMENT & PLAN:   # METASTATIC PROSTATE CANCER/hormone sensitive-  With widespread metastases to the bone. Declined chemo. Patient is currently on first line Lupron every 3 months. February 2017 PSA was 1000; recommend checking PSA/ testosterone today.  # Back pain secondary malignancy status post radiation- no significant improvement of pain. Given the worsening pain-recommend increasing the OxyContin to 40 twice a day; continue oxycodone 10 mg every 6-8 hours. New prescriptions given.Also start the patient on  dexamethasone 4 mg twice a day for 10 days. Given this is cancer-related pain- we will take over his pain management. We'll inform patient's pain physician. As per patient- he is also being started on Xofigo. Patient declined x-ray.  # Anxiety/muscle relaxant- follow up with PCP.  # Recommend follow-up in end of May/for Lupron/Xgeva labs-CBC CMP PSA  # 25 minutes face-to-face with the patient discussing the above plan of care; more than 50% of time spent on prognosis/ natural history; counseling and coordination.     [name], MD 04/27/2015 11:23 AM

## 2015-04-28 ENCOUNTER — Other Ambulatory Visit: Payer: Self-pay | Admitting: *Deleted

## 2015-04-28 ENCOUNTER — Ambulatory Visit
Admission: RE | Admit: 2015-04-28 | Discharge: 2015-04-28 | Disposition: A | Discharge status: Home / Self Care | Payer: 59 | Source: Ambulatory Visit | Attending: Radiation Oncology | Admitting: Radiation Oncology

## 2015-04-28 ENCOUNTER — Encounter: Payer: Self-pay | Admitting: Radiation Oncology

## 2015-04-28 VITALS — BP 164/91 | HR 103 | Temp 97.7°F | Resp 18 | Wt 197.4 lb

## 2015-04-28 DIAGNOSIS — Z510 Encounter for antineoplastic radiation therapy: Secondary | ICD-10-CM | POA: Insufficient documentation

## 2015-04-28 DIAGNOSIS — C61 Malignant neoplasm of prostate: Secondary | ICD-10-CM | POA: Diagnosis not present

## 2015-04-28 DIAGNOSIS — C7951 Secondary malignant neoplasm of bone: Principal | ICD-10-CM

## 2015-04-28 LAB — PSA: PSA: 33.39 ng/mL — AB (ref 0.00–4.00)

## 2015-04-28 NOTE — Progress Notes (Signed)
Radiation Oncology Follow up Note  Name: Robert Short   Date:   04/28/2015 MRN:  [id number] DOB: 1950-11-08    This 65 y.o. male presents to the clinic today for Xofigo injection #1.  REFERRING PROVIDER: Arlis Porta., MD  [id number] is a 65 year old male with stage IV prostate cancer originally status post prostatectomy in 2011 for Gleason 7 (3+4) adenocarcinoma of the prostate involving the bladder neck with biochemical recurrence. In July 2014. He was started on case adduction Lupron declined chemotherapy has been started on X Geva every 3 months. I recently completed palliative radiation therapy to his SI joints as well as a single fraction of 800 cGy to his right proximal femur and acetabulum. He is doing poor. He continues to have significant pain his bone scan shows widespread axial metastasis. He is again discussed with any chemotherapy options and has declined chemotherapy. He is seen today for his first Xofigo injection  Peripheral line was started on the patient and 20 cc of normal saline were passed to make sure there was patency of the lines. Trudi Ida was administered over a 10 minute infusion push by radiation oncologist. After completion of IV push of Xofigo 30 cc an additional saline were passed through the peripheral line. Oral lines syringes drapes and original container of Xofigo were then taken to nuclear medicine for storage. Patient tolerated the procedure well without side effect or complaint. Patient has anti-emetic medication. Have scheduled the patient for a three-week followup to check on his counts. Patient is to call sooner with any side effects or complaints.   COMPLICATIONS OF TREATMENT: none  FOLLOW UP COMPLIANCE: keeps appointments   PHYSICAL EXAM:  BP 164/91 mmHg  Pulse 103  Temp(Src) 97.7 F (36.5 C)  Resp 18  Wt 197 lb 6.8 oz (89.55 kg) Well-developed well-nourished patient in NAD. HEENT reveals PERLA, EOMI, discs not visualized.  Oral cavity is  clear. No oral mucosal lesions are identified. Neck is clear without evidence of cervical or supraclavicular adenopathy. Lungs are clear to A&P. Cardiac examination is essentially unremarkable with regular rate and rhythm without murmur rub or thrill. Abdomen is benign with no organomegaly or masses noted. Motor sensory and DTR levels are equal and symmetric in the upper and lower extremities. Cranial nerves II through XII are grossly intact. Proprioception is intact. No peripheral adenopathy or edema is identified. No motor or sensory levels are noted. Crude visual fields are within normal range.  RADIOLOGY RESULTS: No current films for review  PLAN: Patient tolerated his first injection well. We'll see him back in 4 weeks for his second injection. We'll do CBC prior to that injection. Patient knows to call with any concerns.  I would like to take this opportunity for allowing me to participate in the care of your patient.Armstead Peaks., MD

## 2015-05-03 ENCOUNTER — Encounter: Payer: 59 | Admitting: Pain Medicine

## 2015-05-10 ENCOUNTER — Telehealth: Payer: Self-pay | Admitting: Internal Medicine

## 2015-05-10 NOTE — Telephone Encounter (Signed)
No, pt can not come any sooner for scheduled labs. Please have patient keep this lab apt as scheduled on 5/25.  Pt will be scheduled for xgeva the following week.  These labs must be collected within a few days of this xgeva/lupron.

## 2015-05-10 NOTE — Telephone Encounter (Signed)
Patient is having labs drawn on 5/17 for Dr. Baruch Gouty. Is it okay for him to go ahead and do whatever labs Dr. B wanted drawn on 5/25 that same day on the 17th so he won't have to do it twice? Thanks.

## 2015-05-17 ENCOUNTER — Other Ambulatory Visit: Payer: 59

## 2015-05-19 ENCOUNTER — Inpatient Hospital Stay: Payer: 59 | Attending: Internal Medicine

## 2015-05-19 DIAGNOSIS — C61 Malignant neoplasm of prostate: Secondary | ICD-10-CM | POA: Diagnosis not present

## 2015-05-19 DIAGNOSIS — F329 Major depressive disorder, single episode, unspecified: Secondary | ICD-10-CM | POA: Insufficient documentation

## 2015-05-19 DIAGNOSIS — M25511 Pain in right shoulder: Secondary | ICD-10-CM | POA: Diagnosis not present

## 2015-05-19 DIAGNOSIS — Z923 Personal history of irradiation: Secondary | ICD-10-CM | POA: Insufficient documentation

## 2015-05-19 DIAGNOSIS — C7951 Secondary malignant neoplasm of bone: Secondary | ICD-10-CM | POA: Diagnosis not present

## 2015-05-19 DIAGNOSIS — I2510 Atherosclerotic heart disease of native coronary artery without angina pectoris: Secondary | ICD-10-CM | POA: Insufficient documentation

## 2015-05-19 DIAGNOSIS — Z79818 Long term (current) use of other agents affecting estrogen receptors and estrogen levels: Secondary | ICD-10-CM | POA: Diagnosis not present

## 2015-05-19 DIAGNOSIS — M25512 Pain in left shoulder: Secondary | ICD-10-CM | POA: Diagnosis not present

## 2015-05-19 DIAGNOSIS — Z7982 Long term (current) use of aspirin: Secondary | ICD-10-CM | POA: Diagnosis not present

## 2015-05-19 DIAGNOSIS — I10 Essential (primary) hypertension: Secondary | ICD-10-CM | POA: Insufficient documentation

## 2015-05-19 DIAGNOSIS — Z79899 Other long term (current) drug therapy: Secondary | ICD-10-CM | POA: Diagnosis not present

## 2015-05-19 DIAGNOSIS — F17210 Nicotine dependence, cigarettes, uncomplicated: Secondary | ICD-10-CM | POA: Diagnosis not present

## 2015-05-19 LAB — CBC WITH DIFFERENTIAL/PLATELET
Basophils Absolute: 0 10*3/uL (ref 0–0.1)
Basophils Relative: 1 %
EOS ABS: 0.1 10*3/uL (ref 0–0.7)
Eosinophils Relative: 2 %
HEMATOCRIT: 39.8 % — AB (ref 40.0–52.0)
Hemoglobin: 13.7 g/dL (ref 13.0–18.0)
LYMPHS ABS: 0.6 10*3/uL — AB (ref 1.0–3.6)
Lymphocytes Relative: 17 %
MCH: 31.5 pg (ref 26.0–34.0)
MCHC: 34.3 g/dL (ref 32.0–36.0)
MCV: 91.8 fL (ref 80.0–100.0)
MONO ABS: 0.3 10*3/uL (ref 0.2–1.0)
MONOS PCT: 8 %
Neutro Abs: 2.6 10*3/uL (ref 1.4–6.5)
Neutrophils Relative %: 72 %
Platelets: 109 10*3/uL — ABNORMAL LOW (ref 150–440)
RBC: 4.34 MIL/uL — ABNORMAL LOW (ref 4.40–5.90)
RDW: 15.6 % — AB (ref 11.5–14.5)
WBC: 3.7 10*3/uL — ABNORMAL LOW (ref 3.8–10.6)

## 2015-05-20 DIAGNOSIS — Z510 Encounter for antineoplastic radiation therapy: Secondary | ICD-10-CM | POA: Diagnosis not present

## 2015-05-20 DIAGNOSIS — C61 Malignant neoplasm of prostate: Secondary | ICD-10-CM | POA: Diagnosis not present

## 2015-05-20 DIAGNOSIS — C7951 Secondary malignant neoplasm of bone: Secondary | ICD-10-CM | POA: Diagnosis not present

## 2015-05-24 ENCOUNTER — Encounter: Payer: Self-pay | Admitting: Family Medicine

## 2015-05-24 ENCOUNTER — Other Ambulatory Visit: Payer: Self-pay | Admitting: *Deleted

## 2015-05-24 ENCOUNTER — Ambulatory Visit (INDEPENDENT_AMBULATORY_CARE_PROVIDER_SITE_OTHER): Payer: 59 | Admitting: Family Medicine

## 2015-05-24 VITALS — BP 140/70 | HR 74 | Temp 98.1°F | Resp 16 | Ht 71.0 in | Wt 196.0 lb

## 2015-05-24 DIAGNOSIS — C61 Malignant neoplasm of prostate: Secondary | ICD-10-CM

## 2015-05-24 DIAGNOSIS — I10 Essential (primary) hypertension: Secondary | ICD-10-CM | POA: Diagnosis not present

## 2015-05-24 DIAGNOSIS — G629 Polyneuropathy, unspecified: Secondary | ICD-10-CM

## 2015-05-24 DIAGNOSIS — I2510 Atherosclerotic heart disease of native coronary artery without angina pectoris: Secondary | ICD-10-CM

## 2015-05-24 DIAGNOSIS — I25810 Atherosclerosis of coronary artery bypass graft(s) without angina pectoris: Secondary | ICD-10-CM | POA: Diagnosis not present

## 2015-05-24 DIAGNOSIS — G8929 Other chronic pain: Secondary | ICD-10-CM

## 2015-05-24 DIAGNOSIS — M791 Myalgia: Secondary | ICD-10-CM

## 2015-05-24 DIAGNOSIS — G893 Neoplasm related pain (acute) (chronic): Secondary | ICD-10-CM

## 2015-05-24 DIAGNOSIS — C7951 Secondary malignant neoplasm of bone: Secondary | ICD-10-CM

## 2015-05-24 DIAGNOSIS — M7918 Myalgia, other site: Secondary | ICD-10-CM

## 2015-05-24 DIAGNOSIS — I2584 Coronary atherosclerosis due to calcified coronary lesion: Secondary | ICD-10-CM

## 2015-05-24 DIAGNOSIS — R7309 Other abnormal glucose: Secondary | ICD-10-CM | POA: Diagnosis not present

## 2015-05-24 LAB — POCT GLYCOSYLATED HEMOGLOBIN (HGB A1C): Hemoglobin A1C: 6.2

## 2015-05-24 MED ORDER — OXYCODONE HCL 10 MG PO TABS: ORAL_TABLET | ORAL | Status: DC

## 2015-05-24 MED ORDER — OXYCODONE HCL ER 40 MG PO T12A: 40.0000 mg | EXTENDED_RELEASE_TABLET | Freq: Two times a day (BID) | ORAL | Status: DC

## 2015-05-24 NOTE — Progress Notes (Signed)
Name: Robert Short   MRN: [id number]    DOB: 1950/10/25   Date:05/24/2015       Progress Note  Subjective  Chief Complaint  Chief Complaint  Patient presents with  . Hypertension    HPI Here for f/u of HBP and pre-diabetes.  He has meteaatic prostate cancer  And sees Oncology.  Pain control by Oncology now.  He had been on Prednisone for about 10 days during last 3 mnths No problem-specific assessment & plan notes found for this encounter.   Past Medical History  Diagnosis Date  . Hypertension   . Depression   . ASCVD (arteriosclerotic cardiovascular disease)   . Back pain   . Joint pain   . Prostate cancer ([place])   . Bone cancer ([place])   . History of kidney stones   . Back pain 10/09/2012  . Unable to ambulate 10/09/2012  . SOB (shortness of breath) 10/09/2012    Past Surgical History  Procedure Laterality Date  . Cardiac catheterization      [facility]  . Wrist surgery    . Tonsillectomy    . Cervix surgery    . Prostatectomy    . Spine surgery      Family History  Problem Relation Age of Onset  . Heart attack Mother   . Hypertension Mother   . Heart attack Father     Social History   Social History  . Marital Status: Married    Spouse Name: N/A  . Number of Children: N/A  . Years of Education: N/A   Occupational History  . Not on file.   Social History Main Topics  . Smoking status: Heavy Tobacco Smoker -- 1.00 packs/day for 40 years    Types: Cigarettes  . Smokeless tobacco: Never Used     Comment: "I use a pack a day in 24 hours. I have quit and started in past."  . Alcohol Use: 0.6 oz/week    1 Cans of beer per week     Comment: occ. about once few months  . Drug Use: No  . Sexual Activity: Not on file   Other Topics Concern  . Not on file   Social History Narrative     Current outpatient prescriptions:  .  acetaminophen (TYLENOL) 500 MG tablet, Take 1,500 mg by mouth 2 (two) times daily. , Disp: , Rfl:  .  aspirin 81 MG tablet, Take 81 mg by  mouth daily., Disp: , Rfl:  .  baclofen (LIORESAL) 20 MG tablet, Take 1 tablet (20 mg total) by mouth 2 (two) times daily., Disp: 60 each, Rfl: 12 .  ibuprofen (ADVIL,MOTRIN) 200 MG tablet, Take 800 mg by mouth every 8 (eight) hours as needed. , Disp: , Rfl:  .  losartan (COZAAR) 100 MG tablet, Take 1/2 tablet twice a day. (Patient taking differently: 100 mg. Take 1/2 tablet twice a day.), Disp: 90 tablet, Rfl: 3 .  metoprolol succinate (TOPROL-XL) 50 MG 24 hr tablet, Take 1 tablet (50 mg total) by mouth daily. Take with or immediately following a meal., Disp: 30 tablet, Rfl: 12 .  Oxycodone HCl 10 MG TABS, One pill every 6- 8 hours as needed for pain, Disp: 120 tablet, Rfl: 0 .  OXYCONTIN 20 MG 12 hr tablet, Take 40 mg by mouth 2 (two) times daily., Disp: , Rfl: 0 .  Wheat Dextrin (BENEFIBER) POWD, Stir 2 tsp. TID into 4-8 oz of any non-carbonated beverage or soft food (hot or cold), Disp:  500 g, Rfl: PRN .  amLODipine (NORVASC) 5 MG tablet, Take 1/2 tablet daily (Patient not taking: Reported on 05/24/2015), Disp: 30 tablet, Rfl: 12  Not on File   Review of Systems  Constitutional: Negative for fever, chills, weight loss and malaise/fatigue.  HENT: Negative for hearing loss.   Eyes: Negative for blurred vision and double vision.  Respiratory: Negative for cough, hemoptysis, shortness of breath and wheezing.   Cardiovascular: Negative for chest pain, palpitations and leg swelling.  Gastrointestinal: Negative for heartburn, abdominal pain and blood in stool.  Genitourinary: Negative for dysuria, urgency and frequency.  Musculoskeletal: Positive for back pain, joint pain and neck pain.  Skin: Negative for rash.  Neurological: Negative for dizziness, tremors, weakness and headaches.      Objective  Filed Vitals:   05/24/15 1433 05/24/15 1526  BP: 158/94 140/70  Pulse: 74   Temp: 98.1 F (36.7 C)   TempSrc: Oral   Resp: 16   Height: '5\' 11"'  (1.803 m)   Weight: 196 lb (88.905 kg)      Physical Exam  Constitutional: He is oriented to person, place, and time and well-developed, well-nourished, and in no distress. No distress.  HENT:  Head: Normocephalic and atraumatic.  Eyes: Conjunctivae and EOM are normal. Pupils are equal, round, and reactive to light. No scleral icterus.  Neck: Normal range of motion. Neck supple. Carotid bruit is not present. No thyromegaly present.  Cardiovascular: Normal rate, regular rhythm and normal heart sounds.  Exam reveals no gallop and no friction rub.   No murmur heard. Pulmonary/Chest: No respiratory distress. He has no wheezes. He has no rales.  Musculoskeletal: He exhibits no edema.  Lymphadenopathy:    He has no cervical adenopathy.  Neurological: He is alert and oriented to person, place, and time.  Vitals reviewed.      Recent Results (from the past 2160 hour(s))  CBC with Differential     Status: Abnormal   Collection Time: 03/16/15 10:32 AM  Result Value Ref Range   WBC 3.7 (L) 3.8 - 10.6 K/uL   RBC 4.67 4.40 - 5.90 MIL/uL   Hemoglobin 13.9 13.0 - 18.0 g/dL   HCT 41.6 40.0 - 52.0 %   MCV 89.0 80.0 - 100.0 fL   MCH 29.7 26.0 - 34.0 pg   MCHC 33.4 32.0 - 36.0 g/dL   RDW 15.2 (H) 11.5 - 14.5 %   Platelets 162 150 - 440 K/uL   Neutrophils Relative % 59 %   Neutro Abs 2.2 1.4 - 6.5 K/uL   Lymphocytes Relative 24 %   Lymphs Abs 0.9 (L) 1.0 - 3.6 K/uL   Monocytes Relative 10 %   Monocytes Absolute 0.4 0.2 - 1.0 K/uL   Eosinophils Relative 6 %   Eosinophils Absolute 0.2 0 - 0.7 K/uL   Basophils Relative 1 %   Basophils Absolute 0.0 0 - 0.1 K/uL  Comprehensive metabolic panel     Status: Abnormal   Collection Time: 03/16/15 10:32 AM  Result Value Ref Range   Sodium 135 135 - 145 mmol/L   Potassium 3.8 3.5 - 5.1 mmol/L   Chloride 107 101 - 111 mmol/L   CO2 24 22 - 32 mmol/L   Glucose, Bld 169 (H) 65 - 99 mg/dL   BUN 7 6 - 20 mg/dL   Creatinine, Ser 0.67 0.61 - 1.24 mg/dL   Calcium 7.3 (L) 8.9 - 10.3 mg/dL    Total Protein 6.6 6.5 - 8.1 g/dL   Albumin  4.0 3.5 - 5.0 g/dL   AST 16 15 - 41 U/L   ALT 13 (L) 17 - 63 U/L   Alkaline Phosphatase 233 (H) 38 - 126 U/L   Total Bilirubin 0.5 0.3 - 1.2 mg/dL   GFR calc non Af Amer >60 >60 mL/min   GFR calc Af Amer >60 >60 mL/min    Comment: (NOTE) The eGFR has been calculated using the CKD EPI equation. This calculation has not been validated in all clinical situations. eGFR's persistently <60 mL/min signify possible Chronic Kidney Disease.    Anion gap 4 (L) 5 - 15  Urinalysis complete, with microscopic [facility]     Status: Abnormal   Collection Time: 04/06/15  4:17 PM  Result Value Ref Range   Color, Urine YELLOW YELLOW   APPearance CLOUDY (A) CLEAR   Glucose, UA NEGATIVE NEGATIVE mg/dL   Bilirubin Urine NEGATIVE NEGATIVE   Ketones, ur NEGATIVE NEGATIVE mg/dL   Specific Gravity, Urine 1.015 1.005 - 1.030   Hgb urine dipstick NEGATIVE NEGATIVE   pH 7.0 5.0 - 8.0   Protein, ur NEGATIVE NEGATIVE mg/dL   Nitrite POSITIVE (A) NEGATIVE   Leukocytes, UA 3+ (A) NEGATIVE   RBC / HPF 0-5 0 - 5 RBC/hpf   WBC, UA TOO NUMEROUS TO COUNT 0 - 5 WBC/hpf   Bacteria, UA MANY (A) NONE SEEN   Squamous Epithelial / LPF 0-5 (A) NONE SEEN  Urine culture     Status: Abnormal   Collection Time: 04/06/15  4:17 PM  Result Value Ref Range   Specimen Description URINE, CLEAN CATCH    Special Requests NONE    Culture >=100,000 COLONIES/mL KLEBSIELLA PNEUMONIAE (A)    Report Status 04/08/2015 FINAL    Organism ID, Bacteria KLEBSIELLA PNEUMONIAE (A)       Susceptibility   Klebsiella pneumoniae - MIC*    AMPICILLIN >=32 RESISTANT Resistant     CEFAZOLIN <=4 SENSITIVE Sensitive     CEFTRIAXONE <=1 SENSITIVE Sensitive     CIPROFLOXACIN <=0.25 SENSITIVE Sensitive     GENTAMICIN <=1 SENSITIVE Sensitive     IMIPENEM <=0.25 SENSITIVE Sensitive     NITROFURANTOIN <=16 SENSITIVE Sensitive     TRIMETH/SULFA <=20 SENSITIVE Sensitive     AMPICILLIN/SULBACTAM 4 SENSITIVE  Sensitive     PIP/TAZO <=4 SENSITIVE Sensitive     Extended ESBL NEGATIVE Sensitive     * >=100,000 COLONIES/mL KLEBSIELLA PNEUMONIAE  CBC with Differential     Status: Abnormal   Collection Time: 04/20/15  1:28 PM  Result Value Ref Range   WBC 3.9 3.8 - 10.6 K/uL   RBC 4.40 4.40 - 5.90 MIL/uL   Hemoglobin 13.5 13.0 - 18.0 g/dL   HCT 39.3 (L) 40.0 - 52.0 %   MCV 89.5 80.0 - 100.0 fL   MCH 30.7 26.0 - 34.0 pg   MCHC 34.4 32.0 - 36.0 g/dL   RDW 15.2 (H) 11.5 - 14.5 %   Platelets 134 (L) 150 - 440 K/uL   Neutrophils Relative % 58 %   Neutro Abs 2.3 1.4 - 6.5 K/uL   Lymphocytes Relative 25 %   Lymphs Abs 1.0 1.0 - 3.6 K/uL   Monocytes Relative 11 %   Monocytes Absolute 0.4 0.2 - 1.0 K/uL   Eosinophils Relative 5 %   Eosinophils Absolute 0.2 0 - 0.7 K/uL   Basophils Relative 1 %   Basophils Absolute 0.0 0 - 0.1 K/uL  CBC with Differential     Status: Abnormal  Collection Time: 04/27/15 11:49 AM  Result Value Ref Range   WBC 3.9 3.8 - 10.6 K/uL   RBC 4.45 4.40 - 5.90 MIL/uL   Hemoglobin 13.5 13.0 - 18.0 g/dL   HCT 40.0 40.0 - 52.0 %   MCV 89.9 80.0 - 100.0 fL   MCH 30.2 26.0 - 34.0 pg   MCHC 33.6 32.0 - 36.0 g/dL   RDW 15.5 (H) 11.5 - 14.5 %   Platelets 127 (L) 150 - 440 K/uL   Neutrophils Relative % 62 %   Neutro Abs 2.4 1.4 - 6.5 K/uL   Lymphocytes Relative 20 %   Lymphs Abs 0.8 (L) 1.0 - 3.6 K/uL   Monocytes Relative 12 %   Monocytes Absolute 0.5 0.2 - 1.0 K/uL   Eosinophils Relative 5 %   Eosinophils Absolute 0.2 0 - 0.7 K/uL   Basophils Relative 1 %   Basophils Absolute 0.0 0 - 0.1 K/uL  Comprehensive metabolic panel     Status: Abnormal   Collection Time: 04/27/15 11:49 AM  Result Value Ref Range   Sodium 138 135 - 145 mmol/L   Potassium 4.0 3.5 - 5.1 mmol/L   Chloride 108 101 - 111 mmol/L   CO2 27 22 - 32 mmol/L   Glucose, Bld 146 (H) 65 - 99 mg/dL   BUN 10 6 - 20 mg/dL   Creatinine, Ser 0.61 0.61 - 1.24 mg/dL   Calcium 8.4 (L) 8.9 - 10.3 mg/dL   Total  Protein 6.5 6.5 - 8.1 g/dL   Albumin 3.9 3.5 - 5.0 g/dL   AST 20 15 - 41 U/L   ALT 19 17 - 63 U/L   Alkaline Phosphatase 84 38 - 126 U/L   Total Bilirubin 0.5 0.3 - 1.2 mg/dL   GFR calc non Af Amer >60 >60 mL/min   GFR calc Af Amer >60 >60 mL/min    Comment: (NOTE) The eGFR has been calculated using the CKD EPI equation. This calculation has not been validated in all clinical situations. eGFR's persistently <60 mL/min signify possible Chronic Kidney Disease.    Anion gap 3 (L) 5 - 15  PSA     Status: Abnormal   Collection Time: 04/27/15 11:49 AM  Result Value Ref Range   PSA 33.39 (H) 0.00 - 4.00 ng/mL    Comment: (NOTE) While PSA levels of <=4.0 ng/ml are reported as reference range, some men with levels below 4.0 ng/ml can have prostate cancer and many men with PSA above 4.0 ng/ml do not have prostate cancer.  Other tests such as free PSA, age specific reference ranges, PSA velocity and PSA doubling time may be helpful especially in men less than 59 years old. Performed at [facility]   CBC with Differential     Status: Abnormal   Collection Time: 05/19/15  1:35 PM  Result Value Ref Range   WBC 3.7 (L) 3.8 - 10.6 K/uL   RBC 4.34 (L) 4.40 - 5.90 MIL/uL   Hemoglobin 13.7 13.0 - 18.0 g/dL   HCT 39.8 (L) 40.0 - 52.0 %   MCV 91.8 80.0 - 100.0 fL   MCH 31.5 26.0 - 34.0 pg   MCHC 34.3 32.0 - 36.0 g/dL   RDW 15.6 (H) 11.5 - 14.5 %   Platelets 109 (L) 150 - 440 K/uL   Neutrophils Relative % 72 %   Neutro Abs 2.6 1.4 - 6.5 K/uL   Lymphocytes Relative 17 %   Lymphs Abs 0.6 (L) 1.0 - 3.6  K/uL   Monocytes Relative 8 %   Monocytes Absolute 0.3 0.2 - 1.0 K/uL   Eosinophils Relative 2 %   Eosinophils Absolute 0.1 0 - 0.7 K/uL   Basophils Relative 1 %   Basophils Absolute 0.0 0 - 0.1 K/uL  POCT HgB A1C     Status: Abnormal   Collection Time: 05/24/15  3:02 PM  Result Value Ref Range   Hemoglobin A1C 6.2%      Assessment & Plan  Problem List Items Addressed This  Visit    None    Visit Diagnoses    Elevated glucose    -  Primary    Relevant Orders    POCT HgB A1C (Completed)       Meds ordered this encounter  Medications  . OXYCONTIN 20 MG 12 hr tablet    Sig: Take 40 mg by mouth 2 (two) times daily.    Refill:  0   1. Elevated glucose  - POCT HgB A1C-6.2  2. Essential (primary) hypertension Cont Amlodipine, Medtoprolol, Losartan  3. Coronary artery disease involving other coronary artery bypass graft   4. Neuropathy ([place])  5. Prostate cancer   6. Musculoskeletal pain   7. Chronic pain

## 2015-05-26 ENCOUNTER — Ambulatory Visit
Admission: RE | Admit: 2015-05-26 | Discharge: 2015-05-26 | Disposition: A | Discharge status: Home / Self Care | Payer: 59 | Source: Ambulatory Visit | Attending: Radiation Oncology | Admitting: Radiation Oncology

## 2015-05-26 ENCOUNTER — Inpatient Hospital Stay: Payer: 59

## 2015-05-26 ENCOUNTER — Other Ambulatory Visit: Payer: Self-pay | Admitting: *Deleted

## 2015-05-26 ENCOUNTER — Encounter: Payer: Self-pay | Admitting: Radiation Oncology

## 2015-05-26 VITALS — BP 143/95 | HR 87 | Temp 97.2°F | Ht 71.0 in | Wt 194.0 lb

## 2015-05-26 DIAGNOSIS — M25511 Pain in right shoulder: Secondary | ICD-10-CM | POA: Diagnosis not present

## 2015-05-26 DIAGNOSIS — Z79899 Other long term (current) drug therapy: Secondary | ICD-10-CM | POA: Diagnosis not present

## 2015-05-26 DIAGNOSIS — C61 Malignant neoplasm of prostate: Secondary | ICD-10-CM | POA: Insufficient documentation

## 2015-05-26 DIAGNOSIS — C7951 Secondary malignant neoplasm of bone: Secondary | ICD-10-CM | POA: Diagnosis not present

## 2015-05-26 DIAGNOSIS — I10 Essential (primary) hypertension: Secondary | ICD-10-CM | POA: Diagnosis not present

## 2015-05-26 DIAGNOSIS — M25512 Pain in left shoulder: Secondary | ICD-10-CM | POA: Diagnosis not present

## 2015-05-26 DIAGNOSIS — Z79818 Long term (current) use of other agents affecting estrogen receptors and estrogen levels: Secondary | ICD-10-CM | POA: Diagnosis not present

## 2015-05-26 DIAGNOSIS — Z923 Personal history of irradiation: Secondary | ICD-10-CM | POA: Diagnosis not present

## 2015-05-26 DIAGNOSIS — Z510 Encounter for antineoplastic radiation therapy: Secondary | ICD-10-CM | POA: Insufficient documentation

## 2015-05-26 DIAGNOSIS — F17210 Nicotine dependence, cigarettes, uncomplicated: Secondary | ICD-10-CM | POA: Diagnosis not present

## 2015-05-26 LAB — CBC WITH DIFFERENTIAL/PLATELET
BASOS PCT: 1 %
Basophils Absolute: 0 10*3/uL (ref 0–0.1)
EOS ABS: 0.1 10*3/uL (ref 0–0.7)
Eosinophils Relative: 2 %
HEMATOCRIT: 42.2 % (ref 40.0–52.0)
HEMOGLOBIN: 14.5 g/dL (ref 13.0–18.0)
LYMPHS ABS: 0.9 10*3/uL — AB (ref 1.0–3.6)
Lymphocytes Relative: 23 %
MCH: 31.4 pg (ref 26.0–34.0)
MCHC: 34.4 g/dL (ref 32.0–36.0)
MCV: 91.4 fL (ref 80.0–100.0)
Monocytes Absolute: 0.5 10*3/uL (ref 0.2–1.0)
Monocytes Relative: 12 %
NEUTROS ABS: 2.5 10*3/uL (ref 1.4–6.5)
NEUTROS PCT: 62 %
Platelets: 173 10*3/uL (ref 150–440)
RBC: 4.62 MIL/uL (ref 4.40–5.90)
RDW: 14.7 % — ABNORMAL HIGH (ref 11.5–14.5)
WBC: 4 10*3/uL (ref 3.8–10.6)

## 2015-05-26 NOTE — Progress Notes (Signed)
Radiation Oncology Follow up Note  Name: Robert Short   Date:   05/26/2015 MRN:  [id number] DOB: 04-28-1950    This 65 y.o. male presents to the clinic today for Xofigo injection #2.  REFERRING PROVIDER: Arlis Porta., MD  HPI: .Patient is a 65 year old male with stage IV prostate cancer originally status post prostatectomy in 2011 for Gleason 7 (3+4) adenocarcinoma of the prostate involving the bladder neck with biochemical recurrence. In July 2014. He was started on case adduction Lupron declined chemotherapy has been started on X Geva every 3 months. I recently completed palliative radiation therapy to his SI joints as well as a single fraction of 800 cGy to his right proximal femur and acetabulum. He is doing poor. He continues to have significant pain his bone scan shows widespread axial metastasis. He is again discussed with any chemotherapy options and has declined chemotherapy. He is seen today for his first Xofigo injection  Peripheral line was started on the patient and 20 cc of normal saline were passed to make sure there was patency of the lines. Trudi Ida was administered over a 10 minute infusion push by radiation oncologist. After completion of IV push of Xofigo 30 cc an additional saline were passed through the peripheral line. Oral lines syringes drapes and original container of Xofigo were then taken to nuclear medicine for storage. Patient tolerated the procedure well without side effect or complaint. Patient has anti-emetic medication. Have scheduled the patient for a three-week followup to check on his counts. Patient is to call sooner with any side effects or complaints.  COMPLICATIONS OF TREATMENT: none  FOLLOW UP COMPLIANCE: keeps appointments   PHYSICAL EXAM:  BP 143/95 mmHg  Pulse 87  Temp(Src) 97.2 F (36.2 C)  Ht 5\' 11"  (1.803 m)  Wt 194 lb 0.1 oz (88 kg)  BMI 27.07 kg/m2 Well-developed well-nourished patient in NAD. HEENT reveals PERLA, EOMI, discs not  visualized.  Oral cavity is clear. No oral mucosal lesions are identified. Neck is clear without evidence of cervical or supraclavicular adenopathy. Lungs are clear to A&P. Cardiac examination is essentially unremarkable with regular rate and rhythm without murmur rub or thrill. Abdomen is benign with no organomegaly or masses noted. Motor sensory and DTR levels are equal and symmetric in the upper and lower extremities. Cranial nerves II through XII are grossly intact. Proprioception is intact. No peripheral adenopathy or edema is identified. No motor or sensory levels are noted. Crude visual fields are within normal range.  RADIOLOGY RESULTS: No current films for review  PLAN: Patient tolerated his second injection well without side effect lungs and Kartik examination were essentially unremarkable to completion of treatment. I've asked to see him back in about 3 weeks were blood work and weight will be taken anticipation of his third injection. Patient knows to call sooner with any concerns.  I would like to take this opportunity to thank you for allowing me to participate in the care of your patient.Armstead Peaks., MD

## 2015-05-27 ENCOUNTER — Other Ambulatory Visit: Payer: 59

## 2015-05-27 ENCOUNTER — Telehealth: Payer: Self-pay | Admitting: Family Medicine

## 2015-05-27 NOTE — Telephone Encounter (Signed)
Patient's wife states it started yesterday morning. Use ice then heat and Tylenol. Patient is not having any other sx. This has happened in the past and just rode it out. Please advise

## 2015-05-27 NOTE — Telephone Encounter (Signed)
Caren Griffins said pt has a continuous pain on left side of head.  Please call [phone]

## 2015-05-27 NOTE — Telephone Encounter (Signed)
HA located back L side of head up to ear. No ringing in ears. No dizziness. No focal neurological symptoms. Pain is not severe- 3-4/10 Tried heat- with good relief. Recommended trying tylenol or excedrin migraine to help with pain as needed. To ER with alarm symptoms. Follow-up next week if not improving.

## 2015-06-01 ENCOUNTER — Other Ambulatory Visit: Payer: 59

## 2015-06-01 ENCOUNTER — Inpatient Hospital Stay: Payer: 59

## 2015-06-01 ENCOUNTER — Telehealth: Payer: Self-pay | Admitting: *Deleted

## 2015-06-01 ENCOUNTER — Inpatient Hospital Stay (HOSPITAL_BASED_OUTPATIENT_CLINIC_OR_DEPARTMENT_OTHER): Payer: 59 | Admitting: Internal Medicine

## 2015-06-01 VITALS — BP 129/81 | HR 66 | Temp 98.3°F | Resp 18 | Wt 194.4 lb

## 2015-06-01 DIAGNOSIS — Z923 Personal history of irradiation: Secondary | ICD-10-CM

## 2015-06-01 DIAGNOSIS — C7951 Secondary malignant neoplasm of bone: Secondary | ICD-10-CM

## 2015-06-01 DIAGNOSIS — C61 Malignant neoplasm of prostate: Secondary | ICD-10-CM | POA: Diagnosis not present

## 2015-06-01 DIAGNOSIS — M25511 Pain in right shoulder: Secondary | ICD-10-CM | POA: Diagnosis not present

## 2015-06-01 DIAGNOSIS — G8929 Other chronic pain: Secondary | ICD-10-CM

## 2015-06-01 DIAGNOSIS — F17210 Nicotine dependence, cigarettes, uncomplicated: Secondary | ICD-10-CM

## 2015-06-01 DIAGNOSIS — Z79899 Other long term (current) drug therapy: Secondary | ICD-10-CM

## 2015-06-01 DIAGNOSIS — M25512 Pain in left shoulder: Secondary | ICD-10-CM

## 2015-06-01 DIAGNOSIS — Z79818 Long term (current) use of other agents affecting estrogen receptors and estrogen levels: Secondary | ICD-10-CM

## 2015-06-01 DIAGNOSIS — G893 Neoplasm related pain (acute) (chronic): Secondary | ICD-10-CM

## 2015-06-01 DIAGNOSIS — I10 Essential (primary) hypertension: Secondary | ICD-10-CM | POA: Diagnosis not present

## 2015-06-01 LAB — CBC WITH DIFFERENTIAL/PLATELET
BASOS PCT: 1 %
Basophils Absolute: 0 10*3/uL (ref 0–0.1)
EOS ABS: 0.1 10*3/uL (ref 0–0.7)
EOS PCT: 3 %
HCT: 41.7 % (ref 40.0–52.0)
HEMOGLOBIN: 14.1 g/dL (ref 13.0–18.0)
LYMPHS ABS: 0.9 10*3/uL — AB (ref 1.0–3.6)
Lymphocytes Relative: 25 %
MCH: 30.7 pg (ref 26.0–34.0)
MCHC: 33.7 g/dL (ref 32.0–36.0)
MCV: 91.1 fL (ref 80.0–100.0)
MONO ABS: 0.4 10*3/uL (ref 0.2–1.0)
MONOS PCT: 12 %
NEUTROS PCT: 61 %
Neutro Abs: 2.3 10*3/uL (ref 1.4–6.5)
PLATELETS: 163 10*3/uL (ref 150–440)
RBC: 4.58 MIL/uL (ref 4.40–5.90)
RDW: 14.3 % (ref 11.5–14.5)
WBC: 3.7 10*3/uL — ABNORMAL LOW (ref 3.8–10.6)

## 2015-06-01 LAB — COMPREHENSIVE METABOLIC PANEL
ALBUMIN: 4.1 g/dL (ref 3.5–5.0)
ALK PHOS: 56 U/L (ref 38–126)
ALT: 15 U/L — ABNORMAL LOW (ref 17–63)
ANION GAP: 8 (ref 5–15)
AST: 20 U/L (ref 15–41)
BUN: 16 mg/dL (ref 6–20)
CALCIUM: 8.7 mg/dL — AB (ref 8.9–10.3)
CHLORIDE: 107 mmol/L (ref 101–111)
CO2: 21 mmol/L — AB (ref 22–32)
Creatinine, Ser: 0.83 mg/dL (ref 0.61–1.24)
GFR calc Af Amer: >60 mL/min (ref >60)
GFR calc non Af Amer: >60 mL/min (ref >60)
GLUCOSE: 168 mg/dL — AB (ref 65–99)
POTASSIUM: 3.8 mmol/L (ref 3.5–5.1)
SODIUM: 136 mmol/L (ref 135–145)
Total Bilirubin: 0.4 mg/dL (ref 0.3–1.2)
Total Protein: 6.8 g/dL (ref 6.5–8.1)

## 2015-06-01 LAB — PSA: PSA: 43.62 ng/mL — ABNORMAL HIGH (ref 0.00–4.00)

## 2015-06-01 MED ORDER — LEUPROLIDE ACETATE (3 MONTH) 22.5 MG IM KIT
22.5000 mg | PACK | Freq: Once | INTRAMUSCULAR | Status: AC
Start: 2015-06-01 — End: 2015-06-01
  Administered 2015-06-01: 22.5 mg via INTRAMUSCULAR
  Filled 2015-06-01: qty 22.5

## 2015-06-01 MED ORDER — DEXAMETHASONE 4 MG PO TABS: ORAL_TABLET | ORAL | Status: DC

## 2015-06-01 MED ORDER — DENOSUMAB 120 MG/1.7ML ~~LOC~~ SOLN
120.0000 mg | Freq: Once | SUBCUTANEOUS | Status: AC
  Administered 2015-06-01: 120 mg via SUBCUTANEOUS

## 2015-06-01 NOTE — Progress Notes (Signed)
Robert Short OFFICE PROGRESS NOTE  Patient Care Team: Arlis Porta., MD as PCP - General (Family Medicine)   SUMMARY OF ONCOLOGIC HISTORY:  # 2011- PROSTATE CANCER [Gleason 3+4]; s/p Prostatectomy [ also involved bladder neck/ECP; Dr.Polaseck]; July 2014- Biochemical recurrence [PSA 14]- started on Zoladex [Dr.Pandit]; lost to follow up.  # JAN 2017- STAGE IV METASTATIC PROSTATE Cancer to Bone- Feb 13th, 2017-  Lupron q 102m [~end of feb]; PSA: 1021; Declined Chemo; April 2017 [xofigo; Dr.Crystal]  # Mets to bone- start X-geva q 80M [May 30th]  # Smoker/ chronic pain/pain clinic    INTERVAL HISTORY:   [name]  With above history of prostate cancer metastatic castrate sensitive currently on Lupron is here for follow-up. Patient in the interim is also getting Xofigo every month. His status post 2 treatments.  Patient continues to have pain in his lower back which is chronic. His pain is fairly steady on OxyContin 40 mg twice a day; oxycodone 10 mg every 6-8 hours.  However patient complains of new pain in bilateral shoulders; also numbness in the bilateral shoulder area. He states the pain in the bilateral shoulders is improving.  Appetite is fair. Weight is fairly stable at 194 pounds. Hot flashes improved. No abdominal pain. No blood in stools. No new shortness of breath or chest pain or cough.   REVIEW OF SYSTEMS:  A complete 10 point review of system is done which is negative except mentioned above/history of present illness.   PAST MEDICAL HISTORY :  Past Medical History  Diagnosis Date  . Hypertension   . Depression   . ASCVD (arteriosclerotic cardiovascular disease)   . Back pain   . Joint pain   . Prostate cancer ([place])   . Bone cancer ([place])   . History of kidney stones   . Back pain 10/09/2012  . Unable to ambulate 10/09/2012  . SOB (shortness of breath) 10/09/2012    PAST SURGICAL HISTORY :   Past Surgical History   Procedure Laterality Date  . Cardiac catheterization      [facility]  . Wrist surgery    . Tonsillectomy    . Cervix surgery    . Prostatectomy    . Spine surgery      FAMILY HISTORY :   Family History  Problem Relation Age of Onset  . Heart attack Mother   . Hypertension Mother   . Heart attack Father     SOCIAL HISTORY:   Social History  Substance Use Topics  . Smoking status: Heavy Tobacco Smoker -- 1.00 packs/day for 40 years    Types: Cigarettes  . Smokeless tobacco: Never Used     Comment: "I use a pack a day in 24 hours. I have quit and started in past."  . Alcohol Use: 0.6 oz/week    1 Cans of beer per week     Comment: occ. about once few months    ALLERGIES:  has no allergies on file.  MEDICATIONS:  Current Outpatient Prescriptions  Medication Sig Dispense Refill  . acetaminophen (TYLENOL) 500 MG tablet Take 1,500 mg by mouth 2 (two) times daily.     Marland Kitchen amLODipine (NORVASC) 5 MG tablet Take 1/2 tablet daily 30 tablet 12  . aspirin 81 MG tablet Take 81 mg by mouth daily.    . baclofen (LIORESAL) 20 MG tablet Take 1 tablet (20 mg total) by mouth 2 (two) times daily. 60 each 12  . ibuprofen (ADVIL,MOTRIN)  200 MG tablet Take 800 mg by mouth every 8 (eight) hours as needed.     Marland Kitchen losartan (COZAAR) 100 MG tablet Take 1/2 tablet twice a day. (Patient taking differently: 100 mg. Take 1/2 tablet twice a day.) 90 tablet 3  . metoprolol succinate (TOPROL-XL) 50 MG 24 hr tablet Take 1 tablet (50 mg total) by mouth daily. Take with or immediately following a meal. 30 tablet 12  . oxyCODONE (OXYCONTIN) 40 mg 12 hr tablet Take 1 tablet (40 mg total) by mouth every 12 (twelve) hours. 60 tablet 0  . Oxycodone HCl 10 MG TABS One pill every 6- 8 hours as needed for pain 120 tablet 0  . Wheat Dextrin (BENEFIBER) POWD Stir 2 tsp. TID into 4-8 oz of any non-carbonated beverage or soft food (hot or cold) 500 g PRN  . dexamethasone (DECADRON) 4 MG tablet 1 pill every 12 hours with food. 20  tablet 0   No current facility-administered medications for this visit.   Facility-Administered Medications Ordered in Other Visits  Medication Dose Route Frequency Provider Last Rate Last Dose  . denosumab (XGEVA) injection 120 mg  120 mg Subcutaneous Once [name], MD      . leuprolide (LUPRON) injection 22.5 mg  22.5 mg Intramuscular Once [name], MD        PHYSICAL EXAMINATION: ECOG PERFORMANCE STATUS: 1 - Symptomatic but completely ambulatory  BP 129/81 mmHg  Pulse 66  Temp(Src) 98.3 F (36.8 C) (Tympanic)  Resp 18  Wt 194 lb 7.1 oz (88.2 kg)  Filed Weights   06/01/15 1159  Weight: 194 lb 7.1 oz (88.2 kg)   GENERAL: Well-nourished well-developed; Alert, no distress and comfortable. Accompanied by his wife. EYES: no pallor or icterus OROPHARYNX: no thrush or ulceration; Upper dentures. NECK: supple, no masses felt LYMPH: no palpable lymphadenopathy in the cervical, axillary or inguinal regions LUNGS: clear to auscultation and No wheeze or crackles HEART/CVS: regular rate & rhythm and no murmurs; No lower extremity edema ABDOMEN:abdomen soft, non-tender and normal bowel sounds; No hepatomegaly. Musculoskeletal:no cyanosis of digits and no clubbing; No significant tenderness noted. PSYCH: alert & oriented x 3 with fluent speech NEURO: no focal motor/sensory deficits SKIN: no rashes or significant lesions   LABORATORY DATA:  I have reviewed the data as listed    Component Value Date/Time   NA 136 06/01/2015 1135   NA 140 07/13/2013 1542   K 3.8 06/01/2015 1135   K 4.3 07/13/2013 1542   CL 107 06/01/2015 1135   CL 107 07/13/2013 1542   CO2 21* 06/01/2015 1135   CO2 26 07/13/2013 1542   GLUCOSE 168* 06/01/2015 1135   GLUCOSE 106* 07/13/2013 1542   BUN 16 06/01/2015 1135   BUN 11 07/13/2013 1542   CREATININE 0.83 06/01/2015 1135   CREATININE 1.01 07/13/2013 1542   CALCIUM 8.7* 06/01/2015 1135   CALCIUM 9.9 07/13/2013 1542   PROT  6.8 06/01/2015 1135   PROT 8.1 07/13/2013 1542   ALBUMIN 4.1 06/01/2015 1135   ALBUMIN 4.3 07/13/2013 1542   AST 20 06/01/2015 1135   AST 26 07/13/2013 1542   ALT 15* 06/01/2015 1135   ALT 32 07/13/2013 1542   ALKPHOS 56 06/01/2015 1135   ALKPHOS 74 07/13/2013 1542   BILITOT 0.4 06/01/2015 1135   BILITOT 0.4 07/13/2013 1542   GFRNONAA >60 06/01/2015 1135   GFRNONAA >60 07/13/2013 1542   GFRAA >60 06/01/2015 1135   GFRAA >60 07/13/2013 1542    No  results found for: SPEP, UPEP  Lab Results  Component Value Date   WBC 3.7* 06/01/2015   NEUTROABS 2.3 06/01/2015   HGB 14.1 06/01/2015   HCT 41.7 06/01/2015   MCV 91.1 06/01/2015   PLT 163 06/01/2015      Chemistry      Component Value Date/Time   NA 136 06/01/2015 1135   NA 140 07/13/2013 1542   K 3.8 06/01/2015 1135   K 4.3 07/13/2013 1542   CL 107 06/01/2015 1135   CL 107 07/13/2013 1542   CO2 21* 06/01/2015 1135   CO2 26 07/13/2013 1542   BUN 16 06/01/2015 1135   BUN 11 07/13/2013 1542   CREATININE 0.83 06/01/2015 1135   CREATININE 1.01 07/13/2013 1542      Component Value Date/Time   CALCIUM 8.7* 06/01/2015 1135   CALCIUM 9.9 07/13/2013 1542   ALKPHOS 56 06/01/2015 1135   ALKPHOS 74 07/13/2013 1542   AST 20 06/01/2015 1135   AST 26 07/13/2013 1542   ALT 15* 06/01/2015 1135   ALT 32 07/13/2013 1542   BILITOT 0.4 06/01/2015 1135   BILITOT 0.4 07/13/2013 1542        ASSESSMENT & PLAN:   # METASTATIC PROSTATE CANCER/hormone sensitive-  With widespread metastases to the bone. Declined chemo. Patient is currently on first line Lupron every 3 months. February 2017 PSA was 1000; April 2017 PSA 33. Patient is responding well to current therapy. PSA from today is pending.  # Metastatic disease to the bone- start Xgeva today; every 3 months; start today. Patient also getting Trudi Ida [2/6 Monthly treatments- through radiation oncology.]  #  Back pain secondary malignancy status post radiation- only mild  improvement. Continue OxyContin 40 mg twice a day; oxycodone 10 mg every 6-8 hours.  # Bilateral shoulder pain/numbness- question cervical spine involvement with malignancy. Discussed regarding MRI of the cervical thoracic spine. Patient declined. I recommend a trial of dexamethasone 4 mg twice a day 10 days for pain. New prescription given.  # Patient will follow-up with me in approximately 1 month no labs to review pain control/management.  # 25 minutes face-to-face with the patient discussing the above plan of care; more than 50% of time spent on prognosis/ natural history; counseling and coordination.      [name], MD 06/01/2015 12:35 PM

## 2015-06-01 NOTE — Progress Notes (Signed)
Patient states he has had pain in his sternum area, but has had this for years.  His left hand has been hurting over the past few days.  States he cannot pick up anything. States there was a "knot" that came up on it and the pain radiates around his hand.

## 2015-06-01 NOTE — Telephone Encounter (Signed)
Pt's wife called. Spoke with Mickel Baas in scheduling. Pt at [facility] in [place]. Decadron rx not at pharmacy. Reviewed chart. Called [facility]. Decadron 4 mgs tablet orally every 12 hrs with food. #20. No RFs verbally called into pharmacist.

## 2015-06-11 ENCOUNTER — Other Ambulatory Visit: Payer: Self-pay | Admitting: Family Medicine

## 2015-06-16 ENCOUNTER — Inpatient Hospital Stay: Payer: 59 | Attending: Radiation Oncology

## 2015-06-16 ENCOUNTER — Other Ambulatory Visit: Payer: Self-pay | Admitting: *Deleted

## 2015-06-16 DIAGNOSIS — F17210 Nicotine dependence, cigarettes, uncomplicated: Secondary | ICD-10-CM | POA: Insufficient documentation

## 2015-06-16 DIAGNOSIS — G8929 Other chronic pain: Secondary | ICD-10-CM | POA: Insufficient documentation

## 2015-06-16 DIAGNOSIS — C7951 Secondary malignant neoplasm of bone: Secondary | ICD-10-CM | POA: Insufficient documentation

## 2015-06-16 DIAGNOSIS — C61 Malignant neoplasm of prostate: Secondary | ICD-10-CM | POA: Insufficient documentation

## 2015-06-16 DIAGNOSIS — I10 Essential (primary) hypertension: Secondary | ICD-10-CM | POA: Insufficient documentation

## 2015-06-16 DIAGNOSIS — Z923 Personal history of irradiation: Secondary | ICD-10-CM | POA: Insufficient documentation

## 2015-06-16 DIAGNOSIS — M545 Low back pain: Secondary | ICD-10-CM | POA: Insufficient documentation

## 2015-06-16 DIAGNOSIS — Z79899 Other long term (current) drug therapy: Secondary | ICD-10-CM | POA: Insufficient documentation

## 2015-06-17 ENCOUNTER — Inpatient Hospital Stay: Payer: 59

## 2015-06-17 DIAGNOSIS — Z79899 Other long term (current) drug therapy: Secondary | ICD-10-CM | POA: Diagnosis not present

## 2015-06-17 DIAGNOSIS — C7951 Secondary malignant neoplasm of bone: Secondary | ICD-10-CM | POA: Diagnosis not present

## 2015-06-17 DIAGNOSIS — Z510 Encounter for antineoplastic radiation therapy: Secondary | ICD-10-CM | POA: Insufficient documentation

## 2015-06-17 DIAGNOSIS — Z923 Personal history of irradiation: Secondary | ICD-10-CM | POA: Diagnosis not present

## 2015-06-17 DIAGNOSIS — C61 Malignant neoplasm of prostate: Secondary | ICD-10-CM | POA: Insufficient documentation

## 2015-06-17 DIAGNOSIS — G8929 Other chronic pain: Secondary | ICD-10-CM | POA: Diagnosis not present

## 2015-06-17 DIAGNOSIS — M545 Low back pain: Secondary | ICD-10-CM | POA: Diagnosis not present

## 2015-06-17 DIAGNOSIS — I10 Essential (primary) hypertension: Secondary | ICD-10-CM | POA: Diagnosis not present

## 2015-06-17 DIAGNOSIS — F17210 Nicotine dependence, cigarettes, uncomplicated: Secondary | ICD-10-CM | POA: Diagnosis not present

## 2015-06-17 LAB — CBC WITH DIFFERENTIAL/PLATELET
BASOS ABS: 0.1 10*3/uL (ref 0–0.1)
Basophils Relative: 2 %
EOS PCT: 1 %
Eosinophils Absolute: 0.1 10*3/uL (ref 0–0.7)
HEMATOCRIT: 40.6 % (ref 40.0–52.0)
Hemoglobin: 13.8 g/dL (ref 13.0–18.0)
LYMPHS ABS: 0.5 10*3/uL — AB (ref 1.0–3.6)
LYMPHS PCT: 7 %
MCH: 31.3 pg (ref 26.0–34.0)
MCHC: 34 g/dL (ref 32.0–36.0)
MCV: 92.1 fL (ref 80.0–100.0)
MONO ABS: 0.8 10*3/uL (ref 0.2–1.0)
Monocytes Relative: 12 %
NEUTROS ABS: 5.5 10*3/uL (ref 1.4–6.5)
Neutrophils Relative %: 78 %
Platelets: 128 10*3/uL — ABNORMAL LOW (ref 150–440)
RBC: 4.41 MIL/uL (ref 4.40–5.90)
RDW: 14.3 % (ref 11.5–14.5)
WBC: 7 10*3/uL (ref 3.8–10.6)

## 2015-06-18 DIAGNOSIS — C61 Malignant neoplasm of prostate: Secondary | ICD-10-CM | POA: Diagnosis not present

## 2015-06-18 DIAGNOSIS — Z510 Encounter for antineoplastic radiation therapy: Secondary | ICD-10-CM | POA: Diagnosis not present

## 2015-06-18 DIAGNOSIS — C7951 Secondary malignant neoplasm of bone: Secondary | ICD-10-CM | POA: Diagnosis not present

## 2015-06-22 ENCOUNTER — Inpatient Hospital Stay (HOSPITAL_BASED_OUTPATIENT_CLINIC_OR_DEPARTMENT_OTHER): Payer: 59 | Admitting: Internal Medicine

## 2015-06-22 VITALS — BP 123/78 | HR 66 | Temp 97.2°F | Resp 18 | Wt 197.5 lb

## 2015-06-22 DIAGNOSIS — C7951 Secondary malignant neoplasm of bone: Secondary | ICD-10-CM | POA: Diagnosis not present

## 2015-06-22 DIAGNOSIS — G8929 Other chronic pain: Secondary | ICD-10-CM

## 2015-06-22 DIAGNOSIS — I10 Essential (primary) hypertension: Secondary | ICD-10-CM | POA: Diagnosis not present

## 2015-06-22 DIAGNOSIS — M545 Low back pain: Secondary | ICD-10-CM

## 2015-06-22 DIAGNOSIS — Z923 Personal history of irradiation: Secondary | ICD-10-CM | POA: Diagnosis not present

## 2015-06-22 DIAGNOSIS — R300 Dysuria: Secondary | ICD-10-CM

## 2015-06-22 DIAGNOSIS — C61 Malignant neoplasm of prostate: Secondary | ICD-10-CM | POA: Diagnosis not present

## 2015-06-22 DIAGNOSIS — F17210 Nicotine dependence, cigarettes, uncomplicated: Secondary | ICD-10-CM | POA: Diagnosis not present

## 2015-06-22 DIAGNOSIS — Z79899 Other long term (current) drug therapy: Secondary | ICD-10-CM | POA: Diagnosis not present

## 2015-06-22 DIAGNOSIS — G893 Neoplasm related pain (acute) (chronic): Secondary | ICD-10-CM

## 2015-06-22 LAB — URINALYSIS COMPLETE WITH MICROSCOPIC (ARMC ONLY)
Bacteria, UA: NONE SEEN
GLUCOSE, UA: NEGATIVE mg/dL
Hgb urine dipstick: NEGATIVE
LEUKOCYTES UA: NEGATIVE
NITRITE: NEGATIVE
PROTEIN: 30 mg/dL — AB
RBC / HPF: NONE SEEN RBC/hpf (ref 0–5)
Specific Gravity, Urine: >1.03 — ABNORMAL HIGH (ref 1.005–1.030)
WBC, UA: NONE SEEN WBC/hpf (ref 0–5)
pH: 5.5 (ref 5.0–8.0)

## 2015-06-22 MED ORDER — OXYCODONE HCL 10 MG PO TABS: ORAL_TABLET | ORAL | Status: DC

## 2015-06-22 MED ORDER — OXYCODONE HCL ER 40 MG PO T12A: 40.0000 mg | EXTENDED_RELEASE_TABLET | Freq: Three times a day (TID) | ORAL | Status: DC

## 2015-06-22 NOTE — Assessment & Plan Note (Signed)
Patient currently on Lupron androgen deprivation therapy.  He is also on [facility] radiation oncology. Patient has had 3 treatments 04 of the planned total 6.  Most recent PSA at May 2017- 43 is slightly up from the nadir,april 2017- 33 [ baseline PSA was greater than thousand].  Question the slight bump is from [place] treatment  Versus castrate resistance.    Recommend checking PSA in approximately one month; CBC CMP testosterone.  If this continues to go up-  Will have discussion regarding chemotherapy.

## 2015-06-22 NOTE — Progress Notes (Signed)
Patient states he is having pain in his legs and right flank area.  He had a kidney infection a few months back and wonders if this is what is wrong.  Has not noticed an odor with his urine but states it is dark.  States he drinks plenty of water.

## 2015-06-22 NOTE — Assessment & Plan Note (Addendum)
#    Back pain secondary malignancy status post radiation- only mild improvement.  Recommend going up on the OxyContin 40 mg to 3 times a day;  And continue oxycodone 10 mg every 6-8 hours.  New prescriptions given.

## 2015-06-22 NOTE — Progress Notes (Signed)
Dr. Tollie Pizza was [address] [place] OFFICE PROGRESS NOTE  Patient Care Team: Arlis Porta., MD as PCP - General (Family Medicine)   SUMMARY OF ONCOLOGIC HISTORY: Oncology History   # 2011- PROSTATE CANCER [Gleason 3+4]; s/p Prostatectomy [ also involved bladder neck/ECP; Dr.Polaseck]; July 2014- Biochemical recurrence [PSA 14]- started on Zoladex [Dr.Pandit]; lost to follow up.  # JAN 2017- STAGE IV METASTATIC PROSTATE Cancer to Bone- Feb 13th, 2017-  Lupron q 73m [~end of feb]; PSA: 1021; Declined Chemo; April 2017 [xofigo; Dr.Crystal]  # Mets to bone- start X-geva q 37M [May 30th]  # Smoker/ chronic pain/pain clinic      Malignant neoplasm of prostate ([place])   08/01/2010 Initial Diagnosis Malignant neoplasm of prostate [facility])    INTERVAL HISTORY:   [name]  With above history of prostate cancer metastatic castrate sensitive currently on Lupron is here for follow-up. Patient in the interim is also getting Xofigo every month. His status post 3 treatments.  Patient continues to have worsening pain  In the lower back.  Is continuing to take up to 5 of the oxycodone  10 mg short-acting.  Appetite is fair. Weight is fairly stable . Hot flashes improved. No abdominal pain. No blood in stools. No new shortness of breath or chest pain or cough.   REVIEW OF SYSTEMS:  A complete 10 point review of system is done which is negative except mentioned above/history of present illness.   PAST MEDICAL HISTORY :  Past Medical History  Diagnosis Date  . Hypertension   . Depression   . ASCVD (arteriosclerotic cardiovascular disease)   . Back pain   . Joint pain   . Prostate cancer ([place])   . Bone cancer ([place])   . History of kidney stones   . Back pain 10/09/2012  . Unable to ambulate 10/09/2012  . SOB (shortness of breath) 10/09/2012    PAST SURGICAL HISTORY :   Past Surgical History  Procedure Laterality Date  . Cardiac  catheterization      [facility]  . Wrist surgery    . Tonsillectomy    . Cervix surgery    . Prostatectomy    . Spine surgery      FAMILY HISTORY :   Family History  Problem Relation Age of Onset  . Heart attack Mother   . Hypertension Mother   . Heart attack Father     SOCIAL HISTORY:   Social History  Substance Use Topics  . Smoking status: Heavy Tobacco Smoker -- 1.00 packs/day for 40 years    Types: Cigarettes  . Smokeless tobacco: Never Used     Comment: "I use a pack a day in 24 hours. I have quit and started in past."  . Alcohol Use: 0.6 oz/week    1 Cans of beer per week     Comment: occ. about once few months    ALLERGIES:  has no allergies on file.  MEDICATIONS:  Current Outpatient Prescriptions  Medication Sig Dispense Refill  . acetaminophen (TYLENOL) 500 MG tablet Take 1,500 mg by mouth 2 (two) times daily.     Marland Kitchen amLODipine (NORVASC) 5 MG tablet Take 1/2 tablet daily 30 tablet 12  . aspirin 81 MG tablet Take 81 mg by mouth daily.    . baclofen (LIORESAL) 20 MG tablet TAKE 1 TABLET BY MOUTH TWICE DAILY 60 tablet 12  . dexamethasone (DECADRON) 4 MG tablet 1 pill every 12 hours with food.  20 tablet 0  . ibuprofen (ADVIL,MOTRIN) 200 MG tablet Take 800 mg by mouth every 8 (eight) hours as needed.     Marland Kitchen losartan (COZAAR) 100 MG tablet Take 1/2 tablet twice a day. (Patient taking differently: 100 mg. Take 1/2 tablet twice a day.) 90 tablet 3  . metoprolol succinate (TOPROL-XL) 50 MG 24 hr tablet Take 1 tablet (50 mg total) by mouth daily. Take with or immediately following a meal. 30 tablet 12  . oxyCODONE (OXYCONTIN) 40 mg 12 hr tablet Take 1 tablet (40 mg total) by mouth every 8 (eight) hours. 90 tablet 0  . Oxycodone HCl 10 MG TABS One pill every 6- 8 hours as needed for pain 120 tablet 0  . Wheat Dextrin (BENEFIBER) POWD Stir 2 tsp. TID into 4-8 oz of any non-carbonated beverage or soft food (hot or cold) 500 g PRN   No current facility-administered medications for  this visit.    PHYSICAL EXAMINATION: ECOG PERFORMANCE STATUS: 1 - Symptomatic but completely ambulatory  BP 123/78 mmHg  Pulse 66  Temp(Src) 97.2 F (36.2 C) (Tympanic)  Resp 18  Wt 197 lb 8.5 oz (89.6 kg)  Filed Weights   06/22/15 1427  Weight: 197 lb 8.5 oz (89.6 kg)   GENERAL: Well-nourished well-developed; Alert, no distress and comfortable. Accompanied by his wife. EYES: no pallor or icterus OROPHARYNX: no thrush or ulceration; Upper dentures. NECK: supple, no masses felt LYMPH: no palpable lymphadenopathy in the cervical, axillary or inguinal regions LUNGS: clear to auscultation and No wheeze or crackles HEART/CVS: regular rate & rhythm and no murmurs; No lower extremity edema ABDOMEN:abdomen soft, non-tender and normal bowel sounds; No hepatomegaly. Musculoskeletal:no cyanosis of digits and no clubbing; No significant tenderness noted. PSYCH: alert & oriented x 3 with fluent speech NEURO: no focal motor/sensory deficits SKIN: no rashes or significant lesions   LABORATORY DATA:  I have reviewed the data as listed    Component Value Date/Time   NA 136 06/01/2015 1135   NA 140 07/13/2013 1542   K 3.8 06/01/2015 1135   K 4.3 07/13/2013 1542   CL 107 06/01/2015 1135   CL 107 07/13/2013 1542   CO2 21* 06/01/2015 1135   CO2 26 07/13/2013 1542   GLUCOSE 168* 06/01/2015 1135   GLUCOSE 106* 07/13/2013 1542   BUN 16 06/01/2015 1135   BUN 11 07/13/2013 1542   CREATININE 0.83 06/01/2015 1135   CREATININE 1.01 07/13/2013 1542   CALCIUM 8.7* 06/01/2015 1135   CALCIUM 9.9 07/13/2013 1542   PROT 6.8 06/01/2015 1135   PROT 8.1 07/13/2013 1542   ALBUMIN 4.1 06/01/2015 1135   ALBUMIN 4.3 07/13/2013 1542   AST 20 06/01/2015 1135   AST 26 07/13/2013 1542   ALT 15* 06/01/2015 1135   ALT 32 07/13/2013 1542   ALKPHOS 56 06/01/2015 1135   ALKPHOS 74 07/13/2013 1542   BILITOT 0.4 06/01/2015 1135   BILITOT 0.4 07/13/2013 1542   GFRNONAA >60 06/01/2015 1135    GFRNONAA >60 07/13/2013 1542   GFRAA >60 06/01/2015 1135   GFRAA >60 07/13/2013 1542    No results found for: SPEP, UPEP  Lab Results  Component Value Date   WBC 7.0 06/17/2015   NEUTROABS 5.5 06/17/2015   HGB 13.8 06/17/2015   HCT 40.6 06/17/2015   MCV 92.1 06/17/2015   PLT 128* 06/17/2015      Chemistry      Component Value Date/Time   NA 136 06/01/2015 1135   NA 140 07/13/2013 1542  K 3.8 06/01/2015 1135   K 4.3 07/13/2013 1542   CL 107 06/01/2015 1135   CL 107 07/13/2013 1542   CO2 21* 06/01/2015 1135   CO2 26 07/13/2013 1542   BUN 16 06/01/2015 1135   BUN 11 07/13/2013 1542   CREATININE 0.83 06/01/2015 1135   CREATININE 1.01 07/13/2013 1542      Component Value Date/Time   CALCIUM 8.7* 06/01/2015 1135   CALCIUM 9.9 07/13/2013 1542   ALKPHOS 56 06/01/2015 1135   ALKPHOS 74 07/13/2013 1542   AST 20 06/01/2015 1135   AST 26 07/13/2013 1542   ALT 15* 06/01/2015 1135   ALT 32 07/13/2013 1542   BILITOT 0.4 06/01/2015 1135   BILITOT 0.4 07/13/2013 1542        ASSESSMENT & PLAN:   Neoplasm related pain #  Back pain secondary malignancy status post radiation- only mild improvement.  Recommend going up on the OxyContin 40 mg to 3 times a day;  And continue oxycodone 10 mg every 6-8 hours.  New prescriptions given.  Malignant neoplasm of prostate [facility])  Patient currently on Lupron androgen deprivation therapy.  He is also on [facility] radiation oncology. Patient has had 3 treatments 04 of the planned total 6.  Most recent PSA at May 2017- 43 is slightly up from the nadir,april 2017- 33 [ baseline PSA was greater than thousand].  Question the slight bump is from [place] treatment  Versus castrate resistance.    Recommend checking PSA in approximately one month; CBC CMP testosterone.  If this continues to go up-  Will have discussion regarding chemotherapy.   # Patient will follow-up with me in approximately 1 month no labs to review pain control/management.  # 25  minutes face-to-face with the patient discussing the above plan of care; more than 50% of time spent on prognosis/ natural history; counseling and coordination.      [name], MD 06/22/2015 3:56 PM

## 2015-06-23 ENCOUNTER — Encounter: Payer: Self-pay | Admitting: Radiation Oncology

## 2015-06-23 ENCOUNTER — Ambulatory Visit
Admission: RE | Admit: 2015-06-23 | Discharge: 2015-06-23 | Disposition: A | Discharge status: Home / Self Care | Payer: 59 | Source: Ambulatory Visit | Attending: Radiation Oncology | Admitting: Radiation Oncology

## 2015-06-23 VITALS — BP 141/86 | HR 106 | Temp 97.0°F | Resp 21 | Wt 199.5 lb

## 2015-06-23 DIAGNOSIS — Z510 Encounter for antineoplastic radiation therapy: Secondary | ICD-10-CM | POA: Diagnosis not present

## 2015-06-23 DIAGNOSIS — C61 Malignant neoplasm of prostate: Secondary | ICD-10-CM | POA: Diagnosis not present

## 2015-06-23 DIAGNOSIS — C7951 Secondary malignant neoplasm of bone: Secondary | ICD-10-CM | POA: Diagnosis not present

## 2015-06-23 NOTE — Progress Notes (Signed)
Radiation Oncology Follow up Note  Name: Robert Short   Date:   06/23/2015 MRN:  [id number] DOB: 1950-01-19    This 65 y.o. male presents to the clinic today for  Xofigo injection #3.  REFERRING PROVIDER: Arlis Porta., MD  HPI:.Patient is a 65 year old male with stage IV prostate cancer originally status post prostatectomy in 2011 for Gleason 7 (3+4) adenocarcinoma of the prostate involving the bladder neck with biochemical recurrence. In July 2014. He was started on case adduction Lupron declined chemotherapy has been started on X Geva every 3 months. I recently completed palliative radiation therapy to his SI joints as well as a single fraction of 800 cGy to his right proximal femur and acetabulum. He is doing poor. He continues to have significant pain his bone scan shows widespread axial metastasis. He is again discussed with any chemotherapy options and has declined chemotherapy. He is seen today for his second Xofigo injection.  Peripheral line was started on the patient and 20 cc of normal saline were passed to make sure there was patency of the lines. Trudi Ida was administered over a 10 minute infusion push by radiation oncologist. After completion of IV push of Xofigo 30 cc an additional saline were passed through the peripheral line. Oral lines syringes drapes and original container of Xofigo were then taken to nuclear medicine for storage. Patient tolerated the procedure well without side effect or complaint. Patient has anti-emetic medication. Have scheduled the patient for a three-week followup to check on his counts. Patient is to call sooner with any side effects or complaints.   COMPLICATIONS OF TREATMENT: none  FOLLOW UP COMPLIANCE: keeps appointments   PHYSICAL EXAM:  BP 141/86 mmHg  Pulse 106  Temp(Src) 97 F (36.1 C)  Resp 21  Wt 199 lb 8.3 oz (90.5 kg) Well-developed well-nourished patient in NAD. HEENT reveals PERLA, EOMI, discs not visualized.  Oral cavity is  clear. No oral mucosal lesions are identified. Neck is clear without evidence of cervical or supraclavicular adenopathy. Lungs are clear to A&P. Cardiac examination is essentially unremarkable with regular rate and rhythm without murmur rub or thrill. Abdomen is benign with no organomegaly or masses noted. Motor sensory and DTR levels are equal and symmetric in the upper and lower extremities. Cranial nerves II through XII are grossly intact. Proprioception is intact. No peripheral adenopathy or edema is identified. No motor or sensory levels are noted. Crude visual fields are within normal range.  RADIOLOGY RESULTS: No current films for review  PLAN: Patient tolerated his third injection well without side effect or complaint no signs of reaction. Lungs are clear to A&P at completion of therapy cardiac examination is essentially unremarkable. We'll see him back in 3 weeks for his fourth injection labs. Patient is to call sooner with any concerns or complaints.  I would like to take this opportunity to thank you for allowing me to participate in the care of your patient.Armstead Peaks., MD

## 2015-06-24 ENCOUNTER — Telehealth: Payer: Self-pay | Admitting: *Deleted

## 2015-06-24 ENCOUNTER — Emergency Department
Admission: EM | Admit: 2015-06-24 | Discharge: 2015-06-24 | Disposition: A | Discharge status: Home / Self Care | Payer: 59 | Attending: Emergency Medicine | Admitting: Emergency Medicine

## 2015-06-24 ENCOUNTER — Encounter: Payer: Self-pay | Admitting: Emergency Medicine

## 2015-06-24 DIAGNOSIS — M545 Low back pain: Secondary | ICD-10-CM | POA: Diagnosis present

## 2015-06-24 DIAGNOSIS — Z79891 Long term (current) use of opiate analgesic: Secondary | ICD-10-CM | POA: Diagnosis not present

## 2015-06-24 DIAGNOSIS — Z7952 Long term (current) use of systemic steroids: Secondary | ICD-10-CM | POA: Diagnosis not present

## 2015-06-24 DIAGNOSIS — I10 Essential (primary) hypertension: Secondary | ICD-10-CM | POA: Insufficient documentation

## 2015-06-24 DIAGNOSIS — Z791 Long term (current) use of non-steroidal anti-inflammatories (NSAID): Secondary | ICD-10-CM | POA: Diagnosis not present

## 2015-06-24 DIAGNOSIS — Z85830 Personal history of malignant neoplasm of bone: Secondary | ICD-10-CM | POA: Insufficient documentation

## 2015-06-24 DIAGNOSIS — Z8546 Personal history of malignant neoplasm of prostate: Secondary | ICD-10-CM | POA: Insufficient documentation

## 2015-06-24 DIAGNOSIS — F17210 Nicotine dependence, cigarettes, uncomplicated: Secondary | ICD-10-CM | POA: Diagnosis not present

## 2015-06-24 DIAGNOSIS — F329 Major depressive disorder, single episode, unspecified: Secondary | ICD-10-CM | POA: Insufficient documentation

## 2015-06-24 DIAGNOSIS — Z79899 Other long term (current) drug therapy: Secondary | ICD-10-CM | POA: Insufficient documentation

## 2015-06-24 DIAGNOSIS — G893 Neoplasm related pain (acute) (chronic): Secondary | ICD-10-CM | POA: Diagnosis not present

## 2015-06-24 DIAGNOSIS — I2510 Atherosclerotic heart disease of native coronary artery without angina pectoris: Secondary | ICD-10-CM | POA: Insufficient documentation

## 2015-06-24 DIAGNOSIS — E785 Hyperlipidemia, unspecified: Secondary | ICD-10-CM | POA: Diagnosis not present

## 2015-06-24 DIAGNOSIS — Z7982 Long term (current) use of aspirin: Secondary | ICD-10-CM | POA: Insufficient documentation

## 2015-06-24 DIAGNOSIS — G8929 Other chronic pain: Secondary | ICD-10-CM

## 2015-06-24 MED ORDER — CYCLOBENZAPRINE HCL 5 MG PO TABS: 5.0000 mg | ORAL_TABLET | Freq: Three times a day (TID) | ORAL | Status: DC | PRN

## 2015-06-24 MED ORDER — ORPHENADRINE CITRATE 30 MG/ML IJ SOLN
60.0000 mg | INTRAMUSCULAR | Status: AC
  Administered 2015-06-24: 60 mg via INTRAMUSCULAR
  Filled 2015-06-24: qty 2

## 2015-06-24 MED ORDER — KETOROLAC TROMETHAMINE 60 MG/2ML IM SOLN
30.0000 mg | Freq: Once | INTRAMUSCULAR | Status: AC
  Administered 2015-06-24: 30 mg via INTRAMUSCULAR
  Filled 2015-06-24: qty 2

## 2015-06-24 MED ORDER — PREDNISONE 10 MG PO TABS: 10.0000 mg | ORAL_TABLET | Freq: Two times a day (BID) | ORAL | Status: DC

## 2015-06-24 MED ORDER — KETOROLAC TROMETHAMINE 10 MG PO TABS: 10.0000 mg | ORAL_TABLET | Freq: Three times a day (TID) | ORAL | Status: DC

## 2015-06-24 NOTE — Discharge Instructions (Signed)
Chronic Back Pain  When back pain lasts longer than 3 months, it is called chronic back pain.People with chronic back pain often go through certain periods that are more intense (flare-ups).  CAUSES Chronic back pain can be caused by wear and tear (degeneration) on different structures in your back. These structures include:  The bones of your spine (vertebrae) and the joints surrounding your spinal cord and nerve roots (facets).  The strong, fibrous tissues that connect your vertebrae (ligaments). Degeneration of these structures may result in pressure on your nerves. This can lead to constant pain. HOME CARE INSTRUCTIONS  Avoid bending, heavy lifting, prolonged sitting, and activities which make the problem worse.  Take brief periods of rest throughout the day to reduce your pain. Lying down or standing usually is better than sitting while you are resting.  Take over-the-counter or prescription medicines only as directed by your caregiver. SEEK IMMEDIATE MEDICAL CARE IF:   You have weakness or numbness in one of your legs or feet.  You have trouble controlling your bladder or bowels.  You have nausea, vomiting, abdominal pain, shortness of breath, or fainting.   This information is not intended to replace advice given to you by your health care provider. Make sure you discuss any questions you have with your health care provider.   Document Released: 01/27/2004 Document Revised: 03/13/2011 Document Reviewed: 06/08/2014 Elsevier Interactive Patient Education 2016 [employer]  Chronic Pain Chronic pain can be defined as pain that is off and on and lasts for 3-6 months or longer. Many things cause chronic pain, which can make it difficult to make a diagnosis. There are many treatment options available for chronic pain. However, finding a treatment that works well for you may require trying various approaches until the right one is found. Many people benefit from a combination of two  or more types of treatment to control their pain. SYMPTOMS  Chronic pain can occur anywhere in the body and can range from mild to very severe. Some types of chronic pain include:  Headache.  Low back pain.  Cancer pain.  Arthritis pain.  Neurogenic pain. This is pain resulting from damage to nerves. People with chronic pain may also have other symptoms such as:  Depression.  Anger.  Insomnia.  Anxiety. DIAGNOSIS  Your health care provider will help diagnose your condition over time. In many cases, the initial focus will be on excluding possible conditions that could be causing the pain. Depending on your symptoms, your health care provider may order tests to diagnose your condition. Some of these tests may include:   Blood tests.   CT scan.   MRI.   X-rays.   Ultrasounds.   Nerve conduction studies.  You may need to see a specialist.  TREATMENT  Finding treatment that works well may take time. You may be referred to a pain specialist. He or she may prescribe medicine or therapies, such as:   Mindful meditation or yoga.  Shots (injections) of numbing or pain-relieving medicines into the spine or area of pain.  Local electrical stimulation.  Acupuncture.   Massage therapy.   Aroma, color, light, or sound therapy.   Biofeedback.   Working with a physical therapist to keep from getting stiff.   Regular, gentle exercise.   Cognitive or behavioral therapy.   Group support.  Sometimes, surgery may be recommended.  HOME CARE INSTRUCTIONS   Take all medicines as directed by your health care provider.   Lessen stress in your  life by relaxing and doing things such as listening to calming music.   Exercise or be active as directed by your health care provider.   Eat a healthy diet and include things such as vegetables, fruits, fish, and lean meats in your diet.   Keep all follow-up appointments with your health care provider.   Attend  a support group with others suffering from chronic pain. SEEK MEDICAL CARE IF:   Your pain gets worse.   You develop a new pain that was not there before.   You cannot tolerate medicines given to you by your health care provider.   You have new symptoms since your last visit with your health care provider.  SEEK IMMEDIATE MEDICAL CARE IF:   You feel weak.   You have decreased sensation or numbness.   You lose control of bowel or bladder function.   Your pain suddenly gets much worse.   You develop shaking.  You develop chills.  You develop confusion.  You develop chest pain.  You develop shortness of breath.  MAKE SURE YOU:  Understand these instructions.  Will watch your condition.  Will get help right away if you are not doing well or get worse.   This information is not intended to replace advice given to you by your health care provider. Make sure you discuss any questions you have with your health care provider.   Document Released: 09/10/2001 Document Revised: 08/21/2012 Document Reviewed: 06/14/2012 Elsevier Interactive Patient Education [employer].   Your exam is essentially normal. You should follow-up with your provider for continued management of your chronic back pain. Take the prescription meds as directed. Apply moist heat or ice to the lower back for added benefit.

## 2015-06-24 NOTE — Telephone Encounter (Signed)
Called patient and LVM that there is no infection in his urine.  MD recommends he increase his fluid intake.

## 2015-06-24 NOTE — ED Notes (Signed)
No relief from pain after IM meds .Marland Kitchen PA aware

## 2015-06-24 NOTE — Telephone Encounter (Signed)
-----   Message from [name], MD sent at 06/22/2015  4:01 PM EDT ----- Please inform pt. No infection in urine- recommend increase fluid intake- Thx

## 2015-06-24 NOTE — ED Notes (Signed)
Pt complains of pain to lower back on right side for over one week. Pt complains of tingling/numbness to leg.

## 2015-06-24 NOTE — ED Provider Notes (Signed)
[facility] Emergency Department Provider Note ____________________________________________  Time seen: 0850    I have reviewed the triage vital signs and the nursing notes.  HISTORY  Chief Complaint  Back Pain  HPI Robert Short is a 65 y.o. male presents to the ED for evaluation management of low back pain with right lower extremities that is above his baseline. He is a chronic pain patient who is currently being followed by Dr. Rogue Bussing for hischronic pain due to metastatic cancer. He describes the onset of this particular pain which is in his right lower back some referral to the anterior thigh yesterday. He denies any injury, accident, trauma. He does admit to doing some significant bending from a seated position while working on some remodeling projects around the house. He has not had any incontinence of bladder or bowel, foot drop, or falling related to this on set of acute on chronic pain. He does describe the pain as being familiar and that it lasts flared about 2 years prior. He was treated with a 1 time epidural steroid injection and had immediate benefit. His recent for reported to the ED today was that the pain was so severe it caused him increased difficulty with walking. He takes oxycodone and OxyContin daily and expects to pick up his prescription from the hospital pharmacy when he leaves here. He does admit to responding well to prednisone in the past when he had an acute flare of his pain. He finished a course of prednisone about a month ago. He rates his overall discomfort at a 10/10 in triage.  Past Medical History  Diagnosis Date  . Hypertension   . Depression   . ASCVD (arteriosclerotic cardiovascular disease)   . Back pain   . Joint pain   . Prostate cancer ([place])   . Bone cancer ([place])   . History of kidney stones   . Back pain 10/09/2012  . Unable to ambulate 10/09/2012  . SOB (shortness of breath) 10/09/2012    Patient Active Problem List    Diagnosis Date Noted  . Neoplasm related pain 06/22/2015  . Cancer associated pain 02/04/2015  . Sebaceous cyst 01/18/2015  . Chronic neck pain 12/08/2014  . Cervical spondylosis 12/08/2014  . Chronic low back pain 12/08/2014  . Lumbar spondylosis 12/08/2014  . Chronic shoulder pain 12/08/2014  . Prostate cancer metastatic to bone ([place]) 12/08/2014  . Cancer-related pain 12/08/2014  . Neurogenic pain 12/08/2014  . Musculoskeletal pain 12/08/2014  . Chronic pain 12/07/2014  . Long term current use of opiate analgesic 12/07/2014  . Long term prescription opiate use 12/07/2014  . Opiate use 12/07/2014  . Encounter for therapeutic drug level monitoring 12/07/2014  . Encounter for chronic pain management 12/07/2014  . Therapeutic opioid-induced constipation (OIC) 12/07/2014  . Fatigue 08/27/2014  . CFIDS (chronic fatigue and immune dysfunction syndrome) 06/04/2014  . Cervical spinal stenosis 06/04/2014  . Clinical depression 06/04/2014  . Essential (primary) hypertension 06/04/2014  . HLD (hyperlipidemia) 06/04/2014  . Amnesia 06/04/2014  . Chronic cervical pain 06/04/2014  . Neuropathy ([place]) 06/04/2014  . Loss of feeling or sensation 06/04/2014  . Blood glucose elevated 06/04/2014  . Pain in shoulder 06/04/2014  . Compulsive tobacco user syndrome 06/04/2014  . CAD (coronary artery disease) 10/09/2012  . Smoker 10/09/2012  . ED (erectile dysfunction) of organic origin 08/08/2012  . Acid reflux 08/08/2012  . Lower urinary tract symptoms 08/08/2012  . Prostate cancer 08/01/2010  . Restless leg 08/01/2010  . Malignant  neoplasm of prostate ([place]) 08/01/2010  . Back ache 08/01/2010    Past Surgical History  Procedure Laterality Date  . Cardiac catheterization      [facility]  . Wrist surgery    . Tonsillectomy    . Cervix surgery    . Prostatectomy    . Spine surgery      Current Outpatient Rx  Name  Route  Sig  Dispense  Refill  . acetaminophen (TYLENOL) 500 MG tablet    Oral   Take 1,500 mg by mouth 2 (two) times daily.          Marland Kitchen amLODipine (NORVASC) 5 MG tablet      Take 1/2 tablet daily   30 tablet   12   . aspirin 81 MG tablet   Oral   Take 81 mg by mouth daily.         . baclofen (LIORESAL) 20 MG tablet      TAKE 1 TABLET BY MOUTH TWICE DAILY   60 tablet   12   . cyclobenzaprine (FLEXERIL) 5 MG tablet   Oral   Take 1 tablet (5 mg total) by mouth 3 (three) times daily as needed for muscle spasms.   15 tablet   0   . dexamethasone (DECADRON) 4 MG tablet      1 pill every 12 hours with food.   20 tablet   0   . ibuprofen (ADVIL,MOTRIN) 200 MG tablet   Oral   Take 800 mg by mouth every 8 (eight) hours as needed.          Marland Kitchen ketorolac (TORADOL) 10 MG tablet   Oral   Take 1 tablet (10 mg total) by mouth every 8 (eight) hours.   15 tablet   0   . losartan (COZAAR) 100 MG tablet      Take 1/2 tablet twice a day. Patient taking differently: 100 mg. Take 1/2 tablet twice a day.   90 tablet   3   . metoprolol succinate (TOPROL-XL) 50 MG 24 hr tablet   Oral   Take 1 tablet (50 mg total) by mouth daily. Take with or immediately following a meal.   30 tablet   12   . oxyCODONE (OXYCONTIN) 40 mg 12 hr tablet   Oral   Take 1 tablet (40 mg total) by mouth every 8 (eight) hours.   90 tablet   0   . Oxycodone HCl 10 MG TABS      One pill every 6- 8 hours as needed for pain   120 tablet   0   . predniSONE (DELTASONE) 10 MG tablet   Oral   Take 1 tablet (10 mg total) by mouth 2 (two) times daily with a meal.   20 tablet   0   . Wheat Dextrin (BENEFIBER) POWD      Stir 2 tsp. TID into 4-8 oz of any non-carbonated beverage or soft food (hot or cold)   500 g   PRN     Do not place this medication, or any other prescri ...     Allergies Review of patient's allergies indicates no known allergies.  Family History  Problem Relation Age of Onset  . Heart attack Mother   . Hypertension Mother   . Heart attack  Father     Social History Social History  Substance Use Topics  . Smoking status: Heavy Tobacco Smoker -- 1.00 packs/day for 40 years    Types: Cigarettes  .  Smokeless tobacco: Never Used     Comment: "I use a pack a day in 24 hours. I have quit and started in past."  . Alcohol Use: No    Review of Systems  Constitutional: Negative for fever. Cardiovascular: Negative for chest pain. Respiratory: Negative for shortness of breath. Gastrointestinal: Negative for abdominal pain, vomiting and diarrhea. Genitourinary: Negative for dysuria, incontinence or retention. Musculoskeletal: Positive for back pain. Skin: Negative for rash. Neurological: Negative for headaches, focal weakness or numbness. ____________________________________________  PHYSICAL EXAM:  VITAL SIGNS: ED Triage Vitals  Enc Vitals Group     BP 06/24/15 0812 103/74 mmHg     Pulse Rate 06/24/15 0812 99     Resp 06/24/15 0812 18     Temp 06/24/15 0812 98.1 F (36.7 C)     Temp Source 06/24/15 0812 Oral     SpO2 06/24/15 0812 98 %     Weight 06/24/15 0812 198 lb (89.812 kg)     Height 06/24/15 0812 5\' 11"  (1.803 m)     Head Cir --      Peak Flow --      Pain Score 06/24/15 0813 9     Pain Loc --      Pain Edu? --      Excl. in [place]? --    Constitutional: Alert and oriented. Well appearing and in no distress. Head: Normocephalic and atraumatic. Hematological/Lymphatic/Immunological: No cervical lymphadenopathy. Cardiovascular: Normal rate, regular rhythm.  Respiratory: Normal respiratory effort. No wheezes/rales/rhonchi. Gastrointestinal: Soft and nontender. No distention. Musculoskeletal: Normal spinal alignment without midline tenderness, spasm, deformity, or step-off. Patient with mild tenderness to palpation to the right lumbar sacral region. He is able to transition from sit to stand without assistance. Nontender with normal range of motion in all extremities.  Neurologic: Cranial nerves II through XII  grossly intact. Normal LE DTRs bilaterally. Normal gait without ataxia. Normal speech and language. No gross focal neurologic deficits are appreciated. Skin:  Skin is warm, dry and intact. No rash noted. ____________________________________________  PROCEDURES  Toradol 30 mg IM Norflex 60 mg PO ____________________________________________  INITIAL IMPRESSION / ASSESSMENT AND PLAN / ED COURSE  Patient with an acute flare of his chronic low back pain with the right lower extremity radiculopathy. He is discharged with improvement in his symptoms following IM medication administration. He will be placed on a 10 day steroid course and follow-up with Dr. Rogue Bussing as directed. He will continue his previously prescribed oxycodone and OxyContin as directed. He is also encouraged follow-up with his neurosurgeon in [place] for consideration of a repeat epidural steroid injection. Return precautions are reviewed. ____________________________________________  FINAL CLINICAL IMPRESSION(S) / ED DIAGNOSES  Final diagnoses:  Chronic low back pain     Melvenia Needles, PA-C 06/24/15 1547  Daymon Larsen, MD 06/24/15 1557

## 2015-06-24 NOTE — ED Notes (Signed)
States he has been doing a lot of bending recently   Now having lower back pain which radiates into both legs,but mainly the right  Increased pain with standing or walking

## 2015-07-03 DIAGNOSIS — C61 Malignant neoplasm of prostate: Secondary | ICD-10-CM | POA: Insufficient documentation

## 2015-07-03 DIAGNOSIS — C7951 Secondary malignant neoplasm of bone: Secondary | ICD-10-CM | POA: Insufficient documentation

## 2015-07-03 DIAGNOSIS — Z510 Encounter for antineoplastic radiation therapy: Secondary | ICD-10-CM | POA: Insufficient documentation

## 2015-07-14 ENCOUNTER — Inpatient Hospital Stay: Payer: 59 | Attending: Internal Medicine

## 2015-07-14 DIAGNOSIS — F17210 Nicotine dependence, cigarettes, uncomplicated: Secondary | ICD-10-CM | POA: Diagnosis not present

## 2015-07-14 DIAGNOSIS — Z87442 Personal history of urinary calculi: Secondary | ICD-10-CM | POA: Diagnosis not present

## 2015-07-14 DIAGNOSIS — R0602 Shortness of breath: Secondary | ICD-10-CM | POA: Diagnosis not present

## 2015-07-14 DIAGNOSIS — I2510 Atherosclerotic heart disease of native coronary artery without angina pectoris: Secondary | ICD-10-CM | POA: Insufficient documentation

## 2015-07-14 DIAGNOSIS — F329 Major depressive disorder, single episode, unspecified: Secondary | ICD-10-CM | POA: Diagnosis not present

## 2015-07-14 DIAGNOSIS — C7951 Secondary malignant neoplasm of bone: Secondary | ICD-10-CM | POA: Diagnosis not present

## 2015-07-14 DIAGNOSIS — Z7982 Long term (current) use of aspirin: Secondary | ICD-10-CM | POA: Insufficient documentation

## 2015-07-14 DIAGNOSIS — Z923 Personal history of irradiation: Secondary | ICD-10-CM | POA: Diagnosis not present

## 2015-07-14 DIAGNOSIS — I10 Essential (primary) hypertension: Secondary | ICD-10-CM | POA: Diagnosis not present

## 2015-07-14 DIAGNOSIS — M2550 Pain in unspecified joint: Secondary | ICD-10-CM | POA: Diagnosis not present

## 2015-07-14 DIAGNOSIS — G893 Neoplasm related pain (acute) (chronic): Secondary | ICD-10-CM | POA: Diagnosis not present

## 2015-07-14 DIAGNOSIS — M549 Dorsalgia, unspecified: Secondary | ICD-10-CM | POA: Diagnosis not present

## 2015-07-14 DIAGNOSIS — Z7952 Long term (current) use of systemic steroids: Secondary | ICD-10-CM | POA: Diagnosis not present

## 2015-07-14 DIAGNOSIS — C61 Malignant neoplasm of prostate: Secondary | ICD-10-CM | POA: Insufficient documentation

## 2015-07-14 DIAGNOSIS — Z79899 Other long term (current) drug therapy: Secondary | ICD-10-CM | POA: Insufficient documentation

## 2015-07-14 LAB — CBC WITH DIFFERENTIAL/PLATELET
Basophils Absolute: 0 10*3/uL (ref 0–0.1)
Basophils Relative: 0 %
Eosinophils Absolute: 0 10*3/uL (ref 0–0.7)
Eosinophils Relative: 1 %
HEMATOCRIT: 37.3 % — AB (ref 40.0–52.0)
Hemoglobin: 13 g/dL (ref 13.0–18.0)
LYMPHS ABS: 0.6 10*3/uL — AB (ref 1.0–3.6)
LYMPHS PCT: 19 %
MCH: 31.6 pg (ref 26.0–34.0)
MCHC: 34.9 g/dL (ref 32.0–36.0)
MCV: 90.6 fL (ref 80.0–100.0)
MONO ABS: 0.4 10*3/uL (ref 0.2–1.0)
MONOS PCT: 11 %
NEUTROS ABS: 2.2 10*3/uL (ref 1.4–6.5)
Neutrophils Relative %: 69 %
Platelets: 123 10*3/uL — ABNORMAL LOW (ref 150–440)
RBC: 4.12 MIL/uL — ABNORMAL LOW (ref 4.40–5.90)
RDW: 14.6 % — AB (ref 11.5–14.5)
WBC: 3.2 10*3/uL — ABNORMAL LOW (ref 3.8–10.6)

## 2015-07-14 LAB — COMPREHENSIVE METABOLIC PANEL
ALT: 22 U/L (ref 17–63)
ANION GAP: 5 (ref 5–15)
AST: 21 U/L (ref 15–41)
Albumin: 3.8 g/dL (ref 3.5–5.0)
Alkaline Phosphatase: 38 U/L (ref 38–126)
BILIRUBIN TOTAL: 0.5 mg/dL (ref 0.3–1.2)
BUN: 11 mg/dL (ref 6–20)
CO2: 25 mmol/L (ref 22–32)
Calcium: 9.1 mg/dL (ref 8.9–10.3)
Chloride: 107 mmol/L (ref 101–111)
Creatinine, Ser: 0.65 mg/dL (ref 0.61–1.24)
Glucose, Bld: 139 mg/dL — ABNORMAL HIGH (ref 65–99)
POTASSIUM: 4 mmol/L (ref 3.5–5.1)
Sodium: 137 mmol/L (ref 135–145)
TOTAL PROTEIN: 6.5 g/dL (ref 6.5–8.1)

## 2015-07-14 LAB — PSA: PSA: 14.08 ng/mL — AB (ref 0.00–4.00)

## 2015-07-15 DIAGNOSIS — C61 Malignant neoplasm of prostate: Secondary | ICD-10-CM | POA: Diagnosis not present

## 2015-07-15 DIAGNOSIS — C7951 Secondary malignant neoplasm of bone: Secondary | ICD-10-CM | POA: Diagnosis not present

## 2015-07-15 DIAGNOSIS — Z510 Encounter for antineoplastic radiation therapy: Secondary | ICD-10-CM | POA: Diagnosis not present

## 2015-07-15 LAB — TESTOSTERONE: Testosterone: <3 ng/dL — ABNORMAL LOW (ref 348–1197)

## 2015-07-16 ENCOUNTER — Other Ambulatory Visit: Payer: 59

## 2015-07-20 ENCOUNTER — Inpatient Hospital Stay (HOSPITAL_BASED_OUTPATIENT_CLINIC_OR_DEPARTMENT_OTHER): Payer: 59 | Admitting: Internal Medicine

## 2015-07-20 VITALS — BP 144/90 | HR 90 | Temp 98.2°F | Resp 18 | Wt 201.3 lb

## 2015-07-20 DIAGNOSIS — Z7982 Long term (current) use of aspirin: Secondary | ICD-10-CM

## 2015-07-20 DIAGNOSIS — C7951 Secondary malignant neoplasm of bone: Secondary | ICD-10-CM | POA: Diagnosis not present

## 2015-07-20 DIAGNOSIS — I10 Essential (primary) hypertension: Secondary | ICD-10-CM | POA: Diagnosis not present

## 2015-07-20 DIAGNOSIS — F17210 Nicotine dependence, cigarettes, uncomplicated: Secondary | ICD-10-CM

## 2015-07-20 DIAGNOSIS — Z79899 Other long term (current) drug therapy: Secondary | ICD-10-CM

## 2015-07-20 DIAGNOSIS — F329 Major depressive disorder, single episode, unspecified: Secondary | ICD-10-CM

## 2015-07-20 DIAGNOSIS — I2510 Atherosclerotic heart disease of native coronary artery without angina pectoris: Secondary | ICD-10-CM

## 2015-07-20 DIAGNOSIS — R0602 Shortness of breath: Secondary | ICD-10-CM

## 2015-07-20 DIAGNOSIS — C61 Malignant neoplasm of prostate: Secondary | ICD-10-CM | POA: Diagnosis not present

## 2015-07-20 DIAGNOSIS — Z87442 Personal history of urinary calculi: Secondary | ICD-10-CM

## 2015-07-20 DIAGNOSIS — Z7952 Long term (current) use of systemic steroids: Secondary | ICD-10-CM

## 2015-07-20 DIAGNOSIS — Z923 Personal history of irradiation: Secondary | ICD-10-CM

## 2015-07-20 DIAGNOSIS — M2550 Pain in unspecified joint: Secondary | ICD-10-CM

## 2015-07-20 DIAGNOSIS — M549 Dorsalgia, unspecified: Secondary | ICD-10-CM

## 2015-07-20 DIAGNOSIS — G893 Neoplasm related pain (acute) (chronic): Secondary | ICD-10-CM

## 2015-07-20 DIAGNOSIS — G8929 Other chronic pain: Secondary | ICD-10-CM

## 2015-07-20 MED ORDER — ENZALUTAMIDE 40 MG PO CAPS: 160.0000 mg | ORAL_CAPSULE | Freq: Every day | ORAL | Status: DC

## 2015-07-20 MED ORDER — OXYCODONE HCL ER 40 MG PO T12A: 40.0000 mg | EXTENDED_RELEASE_TABLET | Freq: Three times a day (TID) | ORAL | Status: DC

## 2015-07-20 MED ORDER — OXYCODONE HCL 10 MG PO TABS: ORAL_TABLET | ORAL | Status: DC

## 2015-07-20 NOTE — Progress Notes (Signed)
Patient states he feels confused today.  Also c/o nausea and dizziness.  State he has not eaten anything today.

## 2015-07-20 NOTE — Progress Notes (Signed)
Dr. Tollie Pizza was [address] [place] OFFICE PROGRESS NOTE  Patient Care Team: Arlis Porta., MD as PCP - General (Family Medicine)   SUMMARY OF ONCOLOGIC HISTORY: Oncology History   # 2011- PROSTATE CANCER [Gleason 3+4]; s/p Prostatectomy [ also involved bladder neck/ECP; Dr.Polaseck]; July 2014- Biochemical recurrence [PSA 14]- started on Zoladex [Dr.Pandit]; lost to follow up.  # JAN 2017- STAGE IV METASTATIC PROSTATE Cancer to Bone- Feb 13th, 2017-  Lupron q 28m [~end of feb]; PSA: 1021; Declined Chemo; April 2017 [xofigo; Dr.Crystal]  # Mets to bone- start X-geva q 20M [May 30th]  # Smoker/ chronic pain/pain clinic      Malignant neoplasm of prostate ([place])   08/01/2010 Initial Diagnosis Malignant neoplasm of prostate [facility]    Prostate cancer metastatic to bone ([place])   12/08/2014 Initial Diagnosis Prostate cancer metastatic to bone [facility])    INTERVAL HISTORY:   [name]  With above history of prostate cancer metastatic castrate sensitive currently on Lupron is here for follow-up. Patient in the interim is also getting Xofigo every month. His status post 3 treatments. He is due for treatment number for tomorrow.  He states his pain is fairly well controlled; except for slight pain in the right hip. He continues to be on OxyContin 45 mg 3 times a day; oxycodone 10 mg short-acting 3-4 a day.  He complains of dizziness/episodes of confusion- on and off for the last 4 years. He had previously been evaluated by neurology; Dr. Manuella Ghazi. Symptoms have not gotten any worse; they just come and go.   Overall patient states that he slightly improved since starting the treatments-since February 2017. Appetite is fair. Weight is fairly stable . Hot flashes improved. No abdominal pain. No blood in stools. No new shortness of breath or chest pain or cough.   REVIEW OF SYSTEMS:  A complete 10 point review of system is done which is negative except  mentioned above/history of present illness.   PAST MEDICAL HISTORY :  Past Medical History  Diagnosis Date  . Hypertension   . Depression   . ASCVD (arteriosclerotic cardiovascular disease)   . Back pain   . Joint pain   . Prostate cancer ([employer])   . Bone cancer ([place])   . History of kidney stones   . Back pain 10/09/2012  . Unable to ambulate 10/09/2012  . SOB (shortness of breath) 10/09/2012    PAST SURGICAL HISTORY :   Past Surgical History  Procedure Laterality Date  . Cardiac catheterization      [facility]  . Wrist surgery    . Tonsillectomy    . Cervix surgery    . Prostatectomy    . Spine surgery      FAMILY HISTORY :   Family History  Problem Relation Age of Onset  . Heart attack Mother   . Hypertension Mother   . Heart attack Father     SOCIAL HISTORY:   Social History  Substance Use Topics  . Smoking status: Heavy Tobacco Smoker -- 1.00 packs/day for 40 years    Types: Cigarettes  . Smokeless tobacco: Never Used     Comment: "I use a pack a day in 24 hours. I have quit and started in past."  . Alcohol Use: No    ALLERGIES:  has No Known Allergies.  MEDICATIONS:  Current Outpatient Prescriptions  Medication Sig Dispense Refill  . acetaminophen (TYLENOL) 500 MG tablet Take 1,500 mg by mouth 2 (  two) times daily.     Marland Kitchen amLODipine (NORVASC) 5 MG tablet Take 1/2 tablet daily 30 tablet 12  . aspirin 81 MG tablet Take 81 mg by mouth daily.    . baclofen (LIORESAL) 20 MG tablet TAKE 1 TABLET BY MOUTH TWICE DAILY 60 tablet 12  . cyclobenzaprine (FLEXERIL) 5 MG tablet Take 1 tablet (5 mg total) by mouth 3 (three) times daily as needed for muscle spasms. 15 tablet 0  . dexamethasone (DECADRON) 4 MG tablet 1 pill every 12 hours with food. 20 tablet 0  . ibuprofen (ADVIL,MOTRIN) 200 MG tablet Take 800 mg by mouth every 8 (eight) hours as needed.     Marland Kitchen ketorolac (TORADOL) 10 MG tablet Take 1 tablet (10 mg total) by mouth every 8 (eight) hours. 15 tablet 0  . losartan  (COZAAR) 100 MG tablet Take 1/2 tablet twice a day. (Patient taking differently: 100 mg. Take 1/2 tablet twice a day.) 90 tablet 3  . metoprolol succinate (TOPROL-XL) 50 MG 24 hr tablet Take 1 tablet (50 mg total) by mouth daily. Take with or immediately following a meal. 30 tablet 12  . oxyCODONE (OXYCONTIN) 40 mg 12 hr tablet Take 1 tablet (40 mg total) by mouth every 8 (eight) hours. 90 tablet 0  . Oxycodone HCl 10 MG TABS One pill every 6- 8 hours as needed for pain 120 tablet 0  . predniSONE (DELTASONE) 10 MG tablet Take 1 tablet (10 mg total) by mouth 2 (two) times daily with a meal. 20 tablet 0  . Wheat Dextrin (BENEFIBER) POWD Stir 2 tsp. TID into 4-8 oz of any non-carbonated beverage or soft food (hot or cold) 500 g PRN  . enzalutamide (XTANDI) 40 MG capsule Take 4 capsules (160 mg total) by mouth daily. 120 capsule 0   No current facility-administered medications for this visit.    PHYSICAL EXAMINATION: ECOG PERFORMANCE STATUS: 1 - Symptomatic but completely ambulatory  BP 144/90 mmHg  Pulse 90  Temp(Src) 98.2 F (36.8 C)  Resp 18  Wt 201 lb 4.5 oz (91.3 kg)  Filed Weights   07/20/15 1059  Weight: 201 lb 4.5 oz (91.3 kg)   GENERAL: Well-nourished well-developed; Alert, no distress and comfortable. Accompanied by his wife. EYES: no pallor or icterus OROPHARYNX: no thrush or ulceration; Upper dentures. NECK: supple, no masses felt LYMPH: no palpable lymphadenopathy in the cervical, axillary or inguinal regions LUNGS: clear to auscultation and No wheeze or crackles HEART/CVS: regular rate & rhythm and no murmurs; No lower extremity edema ABDOMEN:abdomen soft, non-tender and normal bowel sounds; No hepatomegaly. Musculoskeletal:no cyanosis of digits and no clubbing; No significant tenderness noted. PSYCH: alert & oriented x 3 with fluent speech NEURO: no focal motor/sensory deficits SKIN: no rashes or significant lesions   LABORATORY DATA:  I have reviewed the  data as listed    Component Value Date/Time   NA 137 07/14/2015 1325   NA 140 07/13/2013 1542   K 4.0 07/14/2015 1325   K 4.3 07/13/2013 1542   CL 107 07/14/2015 1325   CL 107 07/13/2013 1542   CO2 25 07/14/2015 1325   CO2 26 07/13/2013 1542   GLUCOSE 139* 07/14/2015 1325   GLUCOSE 106* 07/13/2013 1542   BUN 11 07/14/2015 1325   BUN 11 07/13/2013 1542   CREATININE 0.65 07/14/2015 1325   CREATININE 1.01 07/13/2013 1542   CALCIUM 9.1 07/14/2015 1325   CALCIUM 9.9 07/13/2013 1542   PROT 6.5 07/14/2015 1325   PROT 8.1 07/13/2013  1542   ALBUMIN 3.8 07/14/2015 1325   ALBUMIN 4.3 07/13/2013 1542   AST 21 07/14/2015 1325   AST 26 07/13/2013 1542   ALT 22 07/14/2015 1325   ALT 32 07/13/2013 1542   ALKPHOS 38 07/14/2015 1325   ALKPHOS 74 07/13/2013 1542   BILITOT 0.5 07/14/2015 1325   BILITOT 0.4 07/13/2013 1542   GFRNONAA >60 07/14/2015 1325   GFRNONAA >60 07/13/2013 1542   GFRAA >60 07/14/2015 1325   GFRAA >60 07/13/2013 1542    No results found for: SPEP, UPEP  Lab Results  Component Value Date   WBC 3.2* 07/14/2015   NEUTROABS 2.2 07/14/2015   HGB 13.0 07/14/2015   HCT 37.3* 07/14/2015   MCV 90.6 07/14/2015   PLT 123* 07/14/2015      Chemistry      Component Value Date/Time   NA 137 07/14/2015 1325   NA 140 07/13/2013 1542   K 4.0 07/14/2015 1325   K 4.3 07/13/2013 1542   CL 107 07/14/2015 1325   CL 107 07/13/2013 1542   CO2 25 07/14/2015 1325   CO2 26 07/13/2013 1542   BUN 11 07/14/2015 1325   BUN 11 07/13/2013 1542   CREATININE 0.65 07/14/2015 1325   CREATININE 1.01 07/13/2013 1542      Component Value Date/Time   CALCIUM 9.1 07/14/2015 1325   CALCIUM 9.9 07/13/2013 1542   ALKPHOS 38 07/14/2015 1325   ALKPHOS 74 07/13/2013 1542   AST 21 07/14/2015 1325   AST 26 07/13/2013 1542   ALT 22 07/14/2015 1325   ALT 32 07/13/2013 1542   BILITOT 0.5 07/14/2015 1325   BILITOT 0.4 07/13/2013 1542        ASSESSMENT & PLAN:    Prostate cancer  metastatic to bone ([place]) Castrate resistant prostate cancer-  Patient currently on Lupron androgen deprivation therapy/ on Xofigo radiation oncology; #4 treatment tomorrow.  # PSA- most recently 12; down from 73 in May; April 33. Testosterone castrate level less than 3. I suspect patient is in castrate resistant phase.   # I discussed alternative options going forward including chemotherapy; patient again declined chemotherapy. Discussed the use of Xtandi- discussed the potential side effects including seizures. Patient has not had prior seizures before. Prescription process started however patient did not start until the next visit.    #  Back pain secondary malignancy status post radiation- stable.  Recommend going up on the OxyContin 40 mg to 3 times a day;  And continue oxycodone 10 mg every 6-8 hours.  New prescriptions given.  # Dizzy spells/chronic- previous neurology evaluation. Recommend follow-up with Dr. Manuella Ghazi.   # Follow-up with me in approximately 4 weeks/Xgeva/Lupron. Labs 1 week prior.   # 25 minutes face-to-face with the patient discussing the above plan of care; more than 50% of time spent on prognosis/ natural history; counseling and coordination.       [name], MD 07/20/2015 12:30 PM

## 2015-07-20 NOTE — Assessment & Plan Note (Addendum)
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  # PSA- most recently 71; down from 48 in May; April 33. Testosterone castrate level less than 3. I suspect patient is in castrate resistant phase.   # I discussed alternative options going forward including chemotherapy; patient again declined chemotherapy. Discussed the use of Xtandi- discussed the potential side effects including seizures. Patient has not had prior seizures before. Prescription process started however patient did not start until the next visit.    #  Back pain secondary malignancy status post radiation- stable.  Recommend going up on the OxyContin 40 mg to 3 times a day;  And continue oxycodone 10 mg every 6-8 hours.  New prescriptions given.  # Dizzy spells/chronic- previous neurology evaluation. Recommend follow-up with Dr. Manuella Ghazi.   # Follow-up with me in approximately 4 weeks/Xgeva/Lupron. Labs 1 week prior.   # 25 minutes face-to-face with the patient discussing the above plan of care; more than 50% of time spent on prognosis/ natural history; counseling and coordination.

## 2015-07-20 NOTE — Progress Notes (Signed)
Patient education performed for xstandi. Consent signed for xstandi. RN explained that rx will be [facility]. Coverage will be determined for insurance purposes. If copay is high, pt can apply for copay assistance.  Teach back process performed.  10 mins. Spent with patient and wife.  Pt identified memory changes as a barriers to retaining clinical information. pt provided with written literature on xtandi.

## 2015-07-20 NOTE — Patient Instructions (Signed)
Enzalutamide capsules  What is this medicine?  ENZALUTAMIDE blocks the effect of the male hormone called testosterone. This medicine is used for certain types of prostate cancer.  This medicine may be used for other purposes; ask your health care provider or pharmacist if you have questions.  What should I tell my health care provider before I take this medicine?  They need to know if you have any of these conditions:  -brain tumor  -head injury  -history of stroke  -seizures  -an unusual or allergic reaction to enzalutamide, other medicines, foods, dyes, or preservatives  -pregnant or trying to get pregnant  -breast-feeding  How should I use this medicine?  Take this medicine by mouth with a glass of water. You can take it with or without food. If it upsets your stomach, take it with food. Follow the directions on the prescription label. Do not cut, crush, or chew this medicine. Take your medicine at regular intervals. Do not take it more often than directed. Do not stop taking except on your doctor's advice.  Talk to your pediatrician regarding the use of this medicine in children. Special care may be needed.  Overdosage: If you think you have taken too much of this medicine contact a poison control center or emergency room at once.  NOTE: This medicine is only for you. Do not share this medicine with others.  What if I miss a dose?  If you miss a dose, take it as soon as you can. If it is almost time for your next dose, take only that dose and skip your missed dose. Do not take double or extra doses.  What may interact with this medicine?  This medicine may interact with the following medications:  -alfentanil  -bosentan  -certain medications for seizures like carbamazepine, phenobarbital, and phenytoin  -cyclosporine  -dihydroergotamine  -efavirenz  -ergotamine  -etravirine  -fentanyl  -gemfibrozil  -midazolam  -modafinil  -nafcillin  -omeprazole  -pimozide  -quinidine  -rifabutin  -rifampin  -rifapentine  -St.  John's Wort  -sirolimus  -tacrolimus  -warfarin  This list may not describe all possible interactions. Give your health care provider a list of all the medicines, herbs, non-prescription drugs, or dietary supplements you use. Also tell them if you smoke, drink alcohol, or use illegal drugs. Some items may interact with your medicine.  What should I watch for while using this medicine?  This medicine should not be used in women. Men should not father a child while taking this medicine and for 3 months after stopping it. There is a potential for serious side effects to an unborn child. Talk to your health care professional or pharmacist for more information.  Due to a risk of seizures with enzalutamide therapy, use caution when engaging in activities that could result in serious harm to yourself or others if you pass out.  What side effects may I notice from receiving this medicine?  Side effects that you should report to your doctor or health care professional as soon as possible:  -allergic reactions like skin rash, itching or hives, swelling of the face, lips, or tongue  -changes in vision  -confusion  -falls  -loss of memory  -seizures  -sudden numbness or weakness of the face, arm or leg  -trouble speaking or understanding  -trouble walking, dizziness, loss of balance or coordination  Side effects that usually do not require medical attention (Report these to your doctor or health care professional if they continue or are   bothersome.):  -blood in the urine  -breathing problems  -diarrhea  -dizziness  -headache  -joint pain  -pain, tingling, numbness in the hands or feet  -swelling of the ankles, feet, hands  -unusually weak or tired  This list may not describe all possible side effects. Call your doctor for medical advice about side effects. You may report side effects to FDA at 1-800-FDA-1088.  Where should I keep my medicine?  Keep out of the reach of children.  Store between 20 and 25 degrees C (68 and 77  degrees F). Throw away any unused medicine after the expiration date.  NOTE: This sheet is a summary. It may not cover all possible information. If you have questions about this medicine, talk to your doctor, pharmacist, or health care provider.      2016, Elsevier/Gold Standard. (2013-08-18 13:59:24)

## 2015-07-21 ENCOUNTER — Telehealth: Payer: Self-pay | Admitting: *Deleted

## 2015-07-21 ENCOUNTER — Encounter: Payer: Self-pay | Admitting: Radiation Oncology

## 2015-07-21 ENCOUNTER — Ambulatory Visit
Admission: RE | Admit: 2015-07-21 | Discharge: 2015-07-21 | Disposition: A | Discharge status: Home / Self Care | Payer: 59 | Source: Ambulatory Visit | Attending: Radiation Oncology | Admitting: Radiation Oncology

## 2015-07-21 VITALS — BP 142/92 | HR 114 | Temp 96.9°F | Resp 18 | Wt 199.2 lb

## 2015-07-21 DIAGNOSIS — C61 Malignant neoplasm of prostate: Secondary | ICD-10-CM | POA: Diagnosis not present

## 2015-07-21 DIAGNOSIS — C7951 Secondary malignant neoplasm of bone: Secondary | ICD-10-CM | POA: Diagnosis not present

## 2015-07-21 DIAGNOSIS — Z510 Encounter for antineoplastic radiation therapy: Secondary | ICD-10-CM | POA: Diagnosis not present

## 2015-07-21 NOTE — Progress Notes (Signed)
Radiation Oncology Follow up Note  Name: Robert Short   Date:   07/21/2015 MRN:  [id number] DOB: [birth year]    This 65 y.o. male presents to the clinic today for fourth Xofigo injection  REFERRING PROVIDER: Arlis Porta., MD Patient is a 65 year old male with stage IV prostate cancer originally status post prostatectomy in 2011 for Gleason 7 (3+4) adenocarcinoma of the prostate involving the bladder neck with biochemical recurrence. In July 2014. He was started on case adduction Lupron declined chemotherapy has been started on X Geva every 3 months. I recently completed palliative radiation therapy to his SI joints as well as a single fraction of 800 cGy to his right proximal femur and acetabulum. He is doing poor. He continues to have significant pain his bone scan shows widespread axial metastasis. He is again discussed with any chemotherapy options and has declined chemotherapy. He is seen today for his fourth Xofigo injection.  Peripheral line was started on the patient and 20 cc of normal saline were passed to make sure there was patency of the lines. Trudi Ida was administered over a 10 minute infusion push by radiation oncologist. After completion of IV push of Xofigo 30 cc an additional saline were passed through the peripheral line. Oral lines syringes drapes and original container of Xofigo were then taken to nuclear medicine for storage. Patient tolerated the procedure well without side effect or complaint. Patient has anti-emetic medication. Have scheduled the patient for a three-week followup to check on his counts. Patient is to call sooner with any side effects or complaints.    COMPLICATIONS OF TREATMENT: none  FOLLOW UP COMPLIANCE: keeps appointments   PHYSICAL EXAM:  BP 142/92 mmHg  Pulse 114  Temp(Src) 96.9 F (36.1 C) (Tympanic)  Resp 18  Wt 199 lb 3 oz (90.35 kg) Well-developed well-nourished patient in NAD. HEENT reveals PERLA, EOMI, discs not visualized.  Oral  cavity is clear. No oral mucosal lesions are identified. Neck is clear without evidence of cervical or supraclavicular adenopathy. Lungs are clear to A&P. Cardiac examination is essentially unremarkable with regular rate and rhythm without murmur rub or thrill. Abdomen is benign with no organomegaly or masses noted. Motor sensory and DTR levels are equal and symmetric in the upper and lower extremities. Cranial nerves II through XII are grossly intact. Proprioception is intact. No peripheral adenopathy or edema is identified. No motor or sensory levels are noted. Crude visual fields are within normal range.  RADIOLOGY RESULTS:  no current films for review  PLAN:  patient tolerated his injection well. He will be seen back for his fifth injection 1 month with prior blood work which is already been arranged. Patient knows to call with any concerns. He continues to do well with very little pain or discomfort at this time.  I would like to take this opportunity to thank you for allowing me to participate in the care of your patient.Armstead Peaks., MD

## 2015-07-21 NOTE — Progress Notes (Signed)
22G peripheral IV started in left hand. Tolerated well.

## 2015-07-21 NOTE — Telephone Encounter (Signed)
Jane from [facility] called to let you know Xtandi prescription has been transferred to [facility] based on patients insurance coverage.

## 2015-07-23 ENCOUNTER — Telehealth: Payer: Self-pay | Admitting: *Deleted

## 2015-07-23 ENCOUNTER — Telehealth: Payer: Self-pay | Admitting: Pharmacist

## 2015-07-23 NOTE — Telephone Encounter (Signed)
Received notification via fax from [facility] on 07/21/15 that Robert Short was transferred to [facility].  rcvd fax notification via from Robert Short "due to network restrictions, [facility] is unable to fill this medicaiton. Robert Short is aware that the medication can be filled at the below indicated pharmacy:  Robert Short fax: [phone] and pharmacy phone [phone]"  I spoke with our lead pharmacist-Robert Short.  He reached out to Robert Short at [employer].  Robert Short will investigate benefits and see if RX can be sent to [facility] for pick up.    1043-notified by Robert Short that pt's xstandi will need prior auth first before filling rx. I asked Robert Short to fax the prior auth information to our dept. Once information received, I will work on this as soon as possible.  I also called pt personally at 1033 am today and left msg to update him that [facility] will need to be dispensing facility. I will keep pt informed and updated in this process.

## 2015-07-23 NOTE — Telephone Encounter (Signed)
msg received form [facility]. md could appeal the deniel. However, rx deniel due to patient not trying zytiga first.  I spoke with Dr. Vivien Presto will write an RX for Zytiga on Monday when md gets back in the office.  I left a brief msg on patient's home phone to let him know the plan.  I called the patient's wife, Caren Griffins and verbally spoke to her. I explained to her that the xtandi was denied-insurance wants to have pt try zytiga first.  I explained the the [place] insurance wanted the [facility] to dispense med. I explained that at this time, I will wait for approval of Zytiga. Once approved, patient will need to come to cancer center to be consented for drug and pt education.  Teach back process performed.  I will determine if [facility] will fill the Zytgia or whether [facility] will fill RX.

## 2015-07-23 NOTE — Telephone Encounter (Signed)
rcvd incoming fax that [employer] auth for xtandi 40 mg capsules has been rejected. RN will need to go to cover my-meds clinical key [id number] to initiate appeal.

## 2015-07-23 NOTE — Telephone Encounter (Signed)
Received fax intended for Renita Papa, RN at [facility]. Faxed to [phone] as requested. Fax confirmation received.  Raul Del, PharmD, BCPS, [facility]  [phone]

## 2015-07-26 ENCOUNTER — Other Ambulatory Visit: Payer: Self-pay

## 2015-07-26 ENCOUNTER — Other Ambulatory Visit: Payer: Self-pay | Admitting: Internal Medicine

## 2015-07-26 DIAGNOSIS — C7951 Secondary malignant neoplasm of bone: Principal | ICD-10-CM

## 2015-07-26 DIAGNOSIS — C61 Malignant neoplasm of prostate: Secondary | ICD-10-CM

## 2015-07-26 MED ORDER — ABIRATERONE ACETATE 250 MG PO TABS: 1000.0000 mg | ORAL_TABLET | Freq: Every day | ORAL | 0 refills | Status: DC

## 2015-07-27 NOTE — Telephone Encounter (Signed)
Prescription sent to [name] 07-26-15.

## 2015-07-30 ENCOUNTER — Telehealth: Payer: Self-pay | Admitting: *Deleted

## 2015-07-30 NOTE — Telephone Encounter (Signed)
Contacted [facility] today. Prior auth initiated for zytgia. Pending approval

## 2015-08-03 DIAGNOSIS — Z510 Encounter for antineoplastic radiation therapy: Secondary | ICD-10-CM | POA: Insufficient documentation

## 2015-08-03 DIAGNOSIS — C61 Malignant neoplasm of prostate: Secondary | ICD-10-CM | POA: Insufficient documentation

## 2015-08-04 ENCOUNTER — Telehealth: Payer: Self-pay | Admitting: *Deleted

## 2015-08-04 ENCOUNTER — Other Ambulatory Visit: Payer: Self-pay | Admitting: *Deleted

## 2015-08-04 NOTE — Telephone Encounter (Signed)
Called [facility] and spoke to [facility] and he states the zytiga was denied due to failure to put pt on steroids along with zytiga.  I spoke to [facility] and told him that the info is incorrect and pt on prednisone 10 mg bid. Every day.  He went through new PA and answered all questions and it was approved and he will send approval of fgood through 08/03/2016 to pharmacy to start processing.

## 2015-08-05 ENCOUNTER — Other Ambulatory Visit: Payer: Self-pay | Admitting: *Deleted

## 2015-08-05 ENCOUNTER — Telehealth: Payer: Self-pay | Admitting: Internal Medicine

## 2015-08-05 DIAGNOSIS — C7951 Secondary malignant neoplasm of bone: Principal | ICD-10-CM

## 2015-08-05 DIAGNOSIS — C61 Malignant neoplasm of prostate: Secondary | ICD-10-CM

## 2015-08-05 MED ORDER — PREDNISONE 5 MG PO TABS: 5.0000 mg | ORAL_TABLET | Freq: Two times a day (BID) | ORAL | 6 refills | Status: DC

## 2015-08-05 NOTE — Telephone Encounter (Signed)
Contacted wife back. Explained that pt has a 0 co-pay for Zytiga.  RN Spoke with Dennison Nancy at [facility] confirm shipment of drug and copay amount.  Wife made aware that RX will be sent to [facility] for pick up.  Pt will also pick up new rx for prednisone. Reminded p's wife to have pt take prednisone simultaneously with zytiga. Pt will come on Tuesday for zytgia consent and teaching. Pt instructed not to take drug until consent is signed.

## 2015-08-05 NOTE — Telephone Encounter (Signed)
She got a call from pharmacy questioning one of the new meds Dr. B wants him to take. They have it approved but they want to know when they need to order it and if he is definitely going to be taking it. It was the [place]. Please contact pharmacy and then let Caren Griffins know you got it cleared up. You can reach her at [phone]. Thanks!

## 2015-08-09 MED FILL — ZYTIGA 250 MG TABLET: 250 | 30 days supply | Qty: 120 | Fill #0

## 2015-08-10 ENCOUNTER — Encounter: Payer: Self-pay | Admitting: *Deleted

## 2015-08-10 ENCOUNTER — Inpatient Hospital Stay: Payer: 59 | Attending: Internal Medicine

## 2015-08-10 DIAGNOSIS — F329 Major depressive disorder, single episode, unspecified: Secondary | ICD-10-CM | POA: Diagnosis not present

## 2015-08-10 DIAGNOSIS — C61 Malignant neoplasm of prostate: Secondary | ICD-10-CM | POA: Insufficient documentation

## 2015-08-10 DIAGNOSIS — I2510 Atherosclerotic heart disease of native coronary artery without angina pectoris: Secondary | ICD-10-CM | POA: Diagnosis not present

## 2015-08-10 DIAGNOSIS — Z87442 Personal history of urinary calculi: Secondary | ICD-10-CM | POA: Diagnosis not present

## 2015-08-10 DIAGNOSIS — Z923 Personal history of irradiation: Secondary | ICD-10-CM | POA: Insufficient documentation

## 2015-08-10 DIAGNOSIS — Z79899 Other long term (current) drug therapy: Secondary | ICD-10-CM | POA: Insufficient documentation

## 2015-08-10 DIAGNOSIS — I10 Essential (primary) hypertension: Secondary | ICD-10-CM | POA: Insufficient documentation

## 2015-08-10 DIAGNOSIS — C7951 Secondary malignant neoplasm of bone: Secondary | ICD-10-CM | POA: Insufficient documentation

## 2015-08-10 DIAGNOSIS — Z7982 Long term (current) use of aspirin: Secondary | ICD-10-CM | POA: Insufficient documentation

## 2015-08-10 DIAGNOSIS — I252 Old myocardial infarction: Secondary | ICD-10-CM | POA: Diagnosis not present

## 2015-08-10 DIAGNOSIS — Z79818 Long term (current) use of other agents affecting estrogen receptors and estrogen levels: Secondary | ICD-10-CM | POA: Diagnosis not present

## 2015-08-10 DIAGNOSIS — F17210 Nicotine dependence, cigarettes, uncomplicated: Secondary | ICD-10-CM | POA: Diagnosis not present

## 2015-08-10 LAB — COMPREHENSIVE METABOLIC PANEL
ALT: 18 U/L (ref 17–63)
ANION GAP: 6 (ref 5–15)
AST: 17 U/L (ref 15–41)
Albumin: 4.1 g/dL (ref 3.5–5.0)
Alkaline Phosphatase: 32 U/L — ABNORMAL LOW (ref 38–126)
BILIRUBIN TOTAL: 0.4 mg/dL (ref 0.3–1.2)
BUN: 11 mg/dL (ref 6–20)
CO2: 28 mmol/L (ref 22–32)
Calcium: 9.6 mg/dL (ref 8.9–10.3)
Chloride: 103 mmol/L (ref 101–111)
Creatinine, Ser: 0.88 mg/dL (ref 0.61–1.24)
Glucose, Bld: 166 mg/dL — ABNORMAL HIGH (ref 65–99)
POTASSIUM: 3.9 mmol/L (ref 3.5–5.1)
Sodium: 137 mmol/L (ref 135–145)
TOTAL PROTEIN: 6.6 g/dL (ref 6.5–8.1)

## 2015-08-10 LAB — CBC WITH DIFFERENTIAL/PLATELET
Basophils Absolute: 0 10*3/uL (ref 0–0.1)
Basophils Relative: 1 %
EOS PCT: 2 %
Eosinophils Absolute: 0.1 10*3/uL (ref 0–0.7)
HEMATOCRIT: 38.7 % — AB (ref 40.0–52.0)
Hemoglobin: 13.2 g/dL (ref 13.0–18.0)
LYMPHS PCT: 24 %
Lymphs Abs: 0.8 10*3/uL — ABNORMAL LOW (ref 1.0–3.6)
MCH: 31.7 pg (ref 26.0–34.0)
MCHC: 34.2 g/dL (ref 32.0–36.0)
MCV: 92.7 fL (ref 80.0–100.0)
MONO ABS: 0.3 10*3/uL (ref 0.2–1.0)
MONOS PCT: 10 %
NEUTROS ABS: 2 10*3/uL (ref 1.4–6.5)
Neutrophils Relative %: 63 %
PLATELETS: 120 10*3/uL — AB (ref 150–440)
RBC: 4.17 MIL/uL — ABNORMAL LOW (ref 4.40–5.90)
RDW: 14.5 % (ref 11.5–14.5)
WBC: 3.1 10*3/uL — ABNORMAL LOW (ref 3.8–10.6)

## 2015-08-10 NOTE — Progress Notes (Signed)
Pt came to cancer center today for lab only. Pt provided with pt education on Zytiga. Discussed side effects, directs of taking the drug, when to contact md, importance of concurrent dosing of steriods with zytiga. I explained that the zytiga needs to be taken on a empty stomach 2 hrs prior to eating or 1 hr after eating.  The steriods will need to be taken at a different time as this needs to be taken with food.  Teach back process performed with patient and his wife.  I provided the patient with written drug information on zytgia from [email or web address].       Generic name: Abiraterone acetate  Fabio Asa is the trade name for the generic drug abiraterone acetate. In some cases, health care professionals may use the generic name abiraterone acetate when referring to the trade name Zytiga.  Drug type:  Fabio Asa is a type of hormone therapy. This medication is classified as an "adrenal inhibitor". (For more detail, see "How Zytiga Works" below)  What Zytiga Is Used For: Fabio Asa is indicated for use in combination with prednisone for the treatment of men with metastatic castration-resistant prostate cancer who have already received prior chemotherapy containing docetaxel.  Note: If a drug has been approved for one use, physicians may elect to use this same drug for other problems if they believe it may be helpful.  How Zytiga Is Given: Fabio Asa is a pill, taken by mouth. It is taken once a day.  It must be taken on an empty stomach (take the pill at least 2 hours after a meal, and do not eat for at least 1 hour after taking the pill)  It should be taken at approximately the same time each day.  Prednisone [10mg  daily] should be taken along with Zytiga.  The amount of Zytiga you receive depends on many factors. Your doctor will determine your dose and schedule  Side Effects: Important things to remember about the side effects of Zytiga:  Most people will not experience all of the Zytiga side effects  listed.  Zytiga side effects are often predictable in terms of their onset, duration, and severity.  Zytiga side effects are almost always reversible and will go away after therapy is complete.  Zytiga side effects may be quite manageable. There are many options to minimize or prevent the side effects of Zytiga.  The following side effects are common (occurring in greater than 30%) for patients taking Zytiga:  Fluid Retention  Increased triglycerides  Increased liver enzymes (AST)  These side effects are less common (occurring in 10-29%) side effects for patients receiving Zytiga:  Joint swelling / discomfort  Decreased levels of potassium in your blood (hypokalemia)  Peripheral Edema (swelling of the hands/feet)  Muscle aches / discomfort  Decreased levels of phosphorus in your blood (hypophosphatemia)  Hot flashes  Diarrhea  Urinary tract infections  Cough  Increased ALT (liver enzyme)  Zytiga works by inhibiting specific "enzymes" in your adrenal glands that are responsible for making androgens. For this reason, there is an increased risk of over production of mineralcorticosteroids in your body while on Zytiga. As a result, Zytiga may cause hypertension [high blood pressure], hypokalemia [low levels of potassium in the blood], and fluid retention. Because of this risk, Zytiga should be used cautiously in patients with a strong cardiovascular history, and patients should be monitored closely for these symptoms. Dosing along with prednisone will decrease the risk of mineralocorticoid excess.  Not all side effects are listed above. Side effects  that are very rare -- occurring in less than about 10 percent of patients -- are not listed here. But you should always inform your health care provider if you experience any unusual symptoms.  When to contact your doctor or health care provider: Contact your health care provider immediately, day or night, if you should experience any of the  following symptoms:  Fever of 100.4 F (38 C or higher, chills)  Chest Pain or Irregular heart beats  Difficulty breathing  Inability to urinate for 8 or more hours  Always inform your health care provider if you experience any unusual symptoms.  The following symptoms require medical attention, but are not an emergency. Contact your health care provider within 24 hours of noticing any of the following:  Dizziness / Light headedness  Significant headache  Diarrhea (4-6 episodes in a 24-hour period)  Unusual bleeding or bruising  Black or tarry stools, or blood in your stools  Extreme fatigue (unable to carry on self-care activities)  Yellow coloring of your skin or eyes  Unusual elevation in blood pressure  Always inform your health care provider if you experience any unusual symptoms.  Precautions: Before starting Zytiga treatment, make sure you tell your doctor about any other medications you are taking (including prescription, over-the-counter, vitamins, herbal remedies, etc.). Do not take aspirin, products containing aspirin unless your doctor specifically permits this.  Fabio Asa is currently not indicated for use in women. However, if Fabio Asa is given to a woman, getting pregnant should be avoided and the woman should not breast feed.  Fabio Asa is currently not indicated for use in women. Zytiga should not be given to women who are pregnant or intend to become pregnant. Pregnancy category X (Zytiga may cause fetal harm when given to a pregnant woman. If a woman becomes pregnant while taking Zytiga, the medication must be stopped immediately and the woman given appropriate counseling).  It is not known if Fabio Asa is excreted in semen, therefore, men should use a condom and another method of birth control during treatment and for 1 week following the last dose of Zytiga.  Self-Care Tips: Zytiga and prednisone are to be used together and neither medication should be interrupted or  stopped unless your healthcare provider tells you to. Take the medication exactly as directed.  Take at the same time each day  Take on an empty stomach  You will need to continue receiving your LHRH/GnRH agonists injections (ie Zoladex, Lupron, Trelstar, etc) throughout treatment with Zytiga.  If you are experiencing hot flashes, wear light clothing, stay in a cool environment to try and reduce symptoms. Consult your healthcare provider if these worsen or become intolerable.  Get plenty of rest.  Maintain good nutrition.  If you experience symptoms or side effects, be sure to discuss them with your health care team. They can prescribe medications and/or offer other suggestions that are effective in managing such problems.  Monitoring and Testing: You will be checked regularly by your doctor while you are taking Zytiga, to monitor side effects and check your response to therapy. Periodic blood work will be obtained to monitor your complete blood count (CBC) as well as the function of other organs (such as your kidneys and liver), as deemed necessary by your doctor.  How Zytiga Works: Hormones are chemical substances that are produced by glands in the body, which enter the bloodstream and cause effects in other tissues. The use of hormone therapy to treat cancer is based on the observation that  receptors for specific hormones that are needed for cell growth are on the surface of some tumor cells. Hormone therapy can work by stopping the production of a certain hormone, blocking hormone receptors or substituting chemically similar agents for the active hormone, which cannot be used by the tumor cell. Different types of hormone therapies are categorized by their function and/or the type of hormone that is affected.  The growth of prostate cancer is stimulated by male hormones (androgens / testosterone). Decreasing the production of these hormones is critical in helping men fight prostate cancer.  Androgens are primarily made by the testicles and adrenal glands. However, in men with advanced prostate cancer, the metastatic tumors themselves have the capability to produce testosterone. Generally, prostate cancer responds to treatment that decreases androgen levels. Many androgen deprivation therapies, decrease androgen production by the testicles but do not affect androgen production elsewhere in the body, such as the adrenal glands or in the tumor.  Zytiga works in a different manner than other androgen deprivation therapies. Zytiga interferes with an enzyme that is expressed in testicular, adrenal, and prostatic tumor tissues and is required as part of the body's androgen producing process. Because of this interference the amount of androgens produced are decreased. Zytiga, blocks androgen production at three sources; the testes, the adrenal glands, as well as from the tumor itself.  Note: We strongly encourage you to talk with your health care professional about your specific medical condition and treatments. The information contained in this website is meant to be helpful and educational, but is not a substitute for medical advice.

## 2015-08-11 LAB — PSA: PSA: 15.26 ng/mL — AB (ref 0.00–4.00)

## 2015-08-16 ENCOUNTER — Other Ambulatory Visit: Payer: Self-pay

## 2015-08-16 ENCOUNTER — Encounter
Admission: EM | Disposition: A | Discharge status: Home / Self Care | Payer: Self-pay | Source: Home / Self Care | Attending: Internal Medicine

## 2015-08-16 ENCOUNTER — Encounter: Payer: Self-pay | Admitting: Emergency Medicine

## 2015-08-16 ENCOUNTER — Emergency Department: Payer: 59

## 2015-08-16 ENCOUNTER — Telehealth: Payer: Self-pay | Admitting: *Deleted

## 2015-08-16 ENCOUNTER — Inpatient Hospital Stay
Admission: EM | Admit: 2015-08-16 | Discharge: 2015-08-18 | DRG: 247 | Disposition: A | Discharge status: Home / Self Care | Payer: 59 | Attending: Internal Medicine | Admitting: Internal Medicine

## 2015-08-16 DIAGNOSIS — R001 Bradycardia, unspecified: Secondary | ICD-10-CM | POA: Diagnosis not present

## 2015-08-16 DIAGNOSIS — Z79891 Long term (current) use of opiate analgesic: Secondary | ICD-10-CM

## 2015-08-16 DIAGNOSIS — F17210 Nicotine dependence, cigarettes, uncomplicated: Secondary | ICD-10-CM | POA: Diagnosis not present

## 2015-08-16 DIAGNOSIS — Z7982 Long term (current) use of aspirin: Secondary | ICD-10-CM

## 2015-08-16 DIAGNOSIS — C7951 Secondary malignant neoplasm of bone: Secondary | ICD-10-CM | POA: Diagnosis present

## 2015-08-16 DIAGNOSIS — Z9221 Personal history of antineoplastic chemotherapy: Secondary | ICD-10-CM

## 2015-08-16 DIAGNOSIS — M25519 Pain in unspecified shoulder: Secondary | ICD-10-CM | POA: Diagnosis not present

## 2015-08-16 DIAGNOSIS — M542 Cervicalgia: Secondary | ICD-10-CM | POA: Diagnosis present

## 2015-08-16 DIAGNOSIS — Z79899 Other long term (current) drug therapy: Secondary | ICD-10-CM | POA: Diagnosis not present

## 2015-08-16 DIAGNOSIS — E785 Hyperlipidemia, unspecified: Secondary | ICD-10-CM | POA: Diagnosis present

## 2015-08-16 DIAGNOSIS — I2102 ST elevation (STEMI) myocardial infarction involving left anterior descending coronary artery: Secondary | ICD-10-CM | POA: Diagnosis not present

## 2015-08-16 DIAGNOSIS — I10 Essential (primary) hypertension: Secondary | ICD-10-CM | POA: Diagnosis not present

## 2015-08-16 DIAGNOSIS — R079 Chest pain, unspecified: Secondary | ICD-10-CM | POA: Diagnosis not present

## 2015-08-16 DIAGNOSIS — G8929 Other chronic pain: Secondary | ICD-10-CM | POA: Diagnosis present

## 2015-08-16 DIAGNOSIS — R0602 Shortness of breath: Secondary | ICD-10-CM | POA: Diagnosis not present

## 2015-08-16 DIAGNOSIS — Z8546 Personal history of malignant neoplasm of prostate: Secondary | ICD-10-CM

## 2015-08-16 DIAGNOSIS — I2510 Atherosclerotic heart disease of native coronary artery without angina pectoris: Secondary | ICD-10-CM | POA: Diagnosis not present

## 2015-08-16 DIAGNOSIS — Z7952 Long term (current) use of systemic steroids: Secondary | ICD-10-CM

## 2015-08-16 DIAGNOSIS — I25119 Atherosclerotic heart disease of native coronary artery with unspecified angina pectoris: Secondary | ICD-10-CM | POA: Diagnosis present

## 2015-08-16 DIAGNOSIS — C61 Malignant neoplasm of prostate: Secondary | ICD-10-CM | POA: Diagnosis not present

## 2015-08-16 DIAGNOSIS — I252 Old myocardial infarction: Secondary | ICD-10-CM | POA: Diagnosis present

## 2015-08-16 DIAGNOSIS — M549 Dorsalgia, unspecified: Secondary | ICD-10-CM | POA: Diagnosis not present

## 2015-08-16 DIAGNOSIS — Z87442 Personal history of urinary calculi: Secondary | ICD-10-CM | POA: Diagnosis not present

## 2015-08-16 DIAGNOSIS — I2111 ST elevation (STEMI) myocardial infarction involving right coronary artery: Secondary | ICD-10-CM | POA: Diagnosis not present

## 2015-08-16 DIAGNOSIS — Z8249 Family history of ischemic heart disease and other diseases of the circulatory system: Secondary | ICD-10-CM

## 2015-08-16 DIAGNOSIS — I2119 ST elevation (STEMI) myocardial infarction involving other coronary artery of inferior wall: Principal | ICD-10-CM | POA: Diagnosis present

## 2015-08-16 DIAGNOSIS — I213 ST elevation (STEMI) myocardial infarction of unspecified site: Secondary | ICD-10-CM | POA: Diagnosis not present

## 2015-08-16 HISTORY — PX: CARDIAC CATHETERIZATION: SHX172

## 2015-08-16 LAB — LIPID PANEL
CHOLESTEROL: 226 mg/dL — AB (ref 0–200)
HDL: 45 mg/dL (ref >40)
LDL Cholesterol: 141 mg/dL — ABNORMAL HIGH (ref 0–99)
Total CHOL/HDL Ratio: 5 RATIO
Triglycerides: 199 mg/dL — ABNORMAL HIGH (ref <150)
VLDL: 40 mg/dL (ref 0–40)

## 2015-08-16 LAB — COMPREHENSIVE METABOLIC PANEL
ALK PHOS: 35 U/L — AB (ref 38–126)
ALT: 15 U/L — AB (ref 17–63)
AST: 19 U/L (ref 15–41)
Albumin: 4.3 g/dL (ref 3.5–5.0)
Anion gap: 10 (ref 5–15)
BUN: 12 mg/dL (ref 6–20)
CALCIUM: 9.7 mg/dL (ref 8.9–10.3)
CO2: 24 mmol/L (ref 22–32)
CREATININE: 0.82 mg/dL (ref 0.61–1.24)
Chloride: 105 mmol/L (ref 101–111)
Glucose, Bld: 171 mg/dL — ABNORMAL HIGH (ref 65–99)
Potassium: 3.6 mmol/L (ref 3.5–5.1)
Sodium: 139 mmol/L (ref 135–145)
Total Bilirubin: 0.6 mg/dL (ref 0.3–1.2)
Total Protein: 6.7 g/dL (ref 6.5–8.1)

## 2015-08-16 LAB — CBC
HEMATOCRIT: 38.5 % — AB (ref 40.0–52.0)
Hemoglobin: 13.3 g/dL (ref 13.0–18.0)
MCH: 32.5 pg (ref 26.0–34.0)
MCHC: 34.6 g/dL (ref 32.0–36.0)
MCV: 93.9 fL (ref 80.0–100.0)
Platelets: 146 10*3/uL — ABNORMAL LOW (ref 150–440)
RBC: 4.1 MIL/uL — ABNORMAL LOW (ref 4.40–5.90)
RDW: 14.6 % — AB (ref 11.5–14.5)
WBC: 5.6 10*3/uL (ref 3.8–10.6)

## 2015-08-16 LAB — GLUCOSE, CAPILLARY: GLUCOSE-CAPILLARY: 130 mg/dL — AB (ref 65–99)

## 2015-08-16 LAB — PROTIME-INR
INR: 0.94
Prothrombin Time: 12.6 seconds (ref 11.4–15.2)

## 2015-08-16 LAB — DIFFERENTIAL
BASOS PCT: 0 %
Basophils Absolute: 0 10*3/uL (ref 0–0.1)
EOS ABS: 0 10*3/uL (ref 0–0.7)
Eosinophils Relative: 1 %
Lymphocytes Relative: 11 %
Lymphs Abs: 0.6 10*3/uL — ABNORMAL LOW (ref 1.0–3.6)
MONO ABS: 0.6 10*3/uL (ref 0.2–1.0)
MONOS PCT: 10 %
Neutro Abs: 4.4 10*3/uL (ref 1.4–6.5)
Neutrophils Relative %: 78 %

## 2015-08-16 LAB — APTT: aPTT: 33 seconds (ref 24–36)

## 2015-08-16 LAB — TROPONIN I: TROPONIN I: 0.03 ng/mL — AB (ref <0.03)

## 2015-08-16 LAB — MRSA PCR SCREENING: MRSA by PCR: NEGATIVE

## 2015-08-16 SURGERY — LEFT HEART CATH AND CORONARY ANGIOGRAPHY
Anesthesia: Moderate Sedation

## 2015-08-16 MED ORDER — ASPIRIN 81 MG PO CHEW
324.0000 mg | CHEWABLE_TABLET | Freq: Once | ORAL | Status: AC
  Administered 2015-08-16: 324 mg via ORAL

## 2015-08-16 MED ORDER — FENTANYL CITRATE (PF) 100 MCG/2ML IJ SOLN
INTRAMUSCULAR | Status: AC
  Filled 2015-08-16: qty 2

## 2015-08-16 MED ORDER — TICAGRELOR 90 MG PO TABS
ORAL_TABLET | ORAL | Status: AC
  Filled 2015-08-16: qty 2

## 2015-08-16 MED ORDER — ONDANSETRON HCL 4 MG/2ML IJ SOLN: 4.0000 mg | Freq: Four times a day (QID) | INTRAMUSCULAR | Status: DC | PRN

## 2015-08-16 MED ORDER — SODIUM CHLORIDE 0.9% FLUSH
3.0000 mL | Freq: Two times a day (BID) | INTRAVENOUS | Status: DC
  Administered 2015-08-16 – 2015-08-18 (×4): 3 mL via INTRAVENOUS

## 2015-08-16 MED ORDER — TICAGRELOR 90 MG PO TABS
90.0000 mg | ORAL_TABLET | Freq: Two times a day (BID) | ORAL | Status: DC
  Administered 2015-08-17 – 2015-08-18 (×3): 90 mg via ORAL
  Filled 2015-08-16 (×4): qty 1

## 2015-08-16 MED ORDER — OXYCODONE HCL 5 MG PO TABS
5.0000 mg | ORAL_TABLET | ORAL | Status: DC | PRN
  Administered 2015-08-16: 5 mg via ORAL
  Filled 2015-08-16: qty 1

## 2015-08-16 MED ORDER — ASPIRIN EC 81 MG PO TBEC
81.0000 mg | DELAYED_RELEASE_TABLET | Freq: Every day | ORAL | Status: DC
  Administered 2015-08-17 – 2015-08-18 (×2): 81 mg via ORAL
  Filled 2015-08-16 (×3): qty 1

## 2015-08-16 MED ORDER — BACLOFEN 10 MG PO TABS
20.0000 mg | ORAL_TABLET | Freq: Two times a day (BID) | ORAL | Status: DC
  Administered 2015-08-16 – 2015-08-18 (×4): 20 mg via ORAL
  Filled 2015-08-16 (×4): qty 2

## 2015-08-16 MED ORDER — CYCLOBENZAPRINE HCL 10 MG PO TABS
5.0000 mg | ORAL_TABLET | Freq: Three times a day (TID) | ORAL | Status: DC | PRN
  Administered 2015-08-17: 5 mg via ORAL
  Filled 2015-08-16: qty 2

## 2015-08-16 MED ORDER — HEPARIN SODIUM (PORCINE) 5000 UNIT/ML IJ SOLN: 60.0000 [IU]/kg | INTRAMUSCULAR | Status: DC

## 2015-08-16 MED ORDER — IOPAMIDOL (ISOVUE-300) INJECTION 61%
INTRAVENOUS | Status: DC | PRN
  Administered 2015-08-16: 280 mL via INTRA_ARTERIAL

## 2015-08-16 MED ORDER — TICAGRELOR 90 MG PO TABS
ORAL_TABLET | ORAL | Status: DC | PRN
  Administered 2015-08-16: 180 mg via ORAL

## 2015-08-16 MED ORDER — LOSARTAN POTASSIUM 50 MG PO TABS
100.0000 mg | ORAL_TABLET | Freq: Every day | ORAL | Status: DC
  Administered 2015-08-16 – 2015-08-18 (×3): 100 mg via ORAL
  Filled 2015-08-16 (×2): qty 4
  Filled 2015-08-16: qty 2

## 2015-08-16 MED ORDER — SODIUM CHLORIDE 0.9 % IV SOLN: 250.0000 mL | INTRAVENOUS | Status: DC | PRN

## 2015-08-16 MED ORDER — NITROGLYCERIN 5 MG/ML IV SOLN
INTRAVENOUS | Status: AC
Start: 2015-08-16 — End: 2015-08-16
  Filled 2015-08-16: qty 10

## 2015-08-16 MED ORDER — SODIUM CHLORIDE 0.9 % WEIGHT BASED INFUSION
3.0000 mL/kg/h | INTRAVENOUS | Status: AC
  Administered 2015-08-16: 3 mL/kg/h via INTRAVENOUS

## 2015-08-16 MED ORDER — ABIRATERONE ACETATE 250 MG PO TABS
1000.0000 mg | ORAL_TABLET | Freq: Every day | ORAL | Status: DC
Start: 2015-08-16 — End: 2015-08-18

## 2015-08-16 MED ORDER — OXYCODONE HCL 5 MG PO TABS
10.0000 mg | ORAL_TABLET | Freq: Four times a day (QID) | ORAL | Status: DC | PRN
  Administered 2015-08-17 – 2015-08-18 (×3): 10 mg via ORAL
  Filled 2015-08-16 (×3): qty 2

## 2015-08-16 MED ORDER — OXYCODONE HCL ER 10 MG PO T12A
40.0000 mg | EXTENDED_RELEASE_TABLET | Freq: Three times a day (TID) | ORAL | Status: DC
  Administered 2015-08-16 – 2015-08-18 (×6): 40 mg via ORAL
  Filled 2015-08-16 (×6): qty 4

## 2015-08-16 MED ORDER — NITROGLYCERIN 1 MG/10 ML FOR IR/CATH LAB
INTRA_ARTERIAL | Status: DC | PRN
  Administered 2015-08-16: 200 ug

## 2015-08-16 MED ORDER — SODIUM CHLORIDE 0.9% FLUSH: 3.0000 mL | INTRAVENOUS | Status: DC | PRN

## 2015-08-16 MED ORDER — MORPHINE SULFATE (PF) 2 MG/ML IV SOLN
2.0000 mg | INTRAVENOUS | Status: DC | PRN
  Administered 2015-08-17: 2 mg via INTRAVENOUS
  Filled 2015-08-16: qty 1

## 2015-08-16 MED ORDER — FENTANYL CITRATE (PF) 100 MCG/2ML IJ SOLN
50.0000 ug | Freq: Once | INTRAMUSCULAR | Status: AC
  Administered 2015-08-16: 50 ug via INTRAVENOUS

## 2015-08-16 MED ORDER — ACETAMINOPHEN 650 MG RE SUPP: 650.0000 mg | Freq: Four times a day (QID) | RECTAL | Status: DC | PRN

## 2015-08-16 MED ORDER — SODIUM CHLORIDE 0.9% FLUSH
3.0000 mL | Freq: Two times a day (BID) | INTRAVENOUS | Status: DC
  Administered 2015-08-16 – 2015-08-18 (×3): 3 mL via INTRAVENOUS

## 2015-08-16 MED ORDER — FENTANYL CITRATE (PF) 100 MCG/2ML IJ SOLN
INTRAMUSCULAR | Status: DC | PRN
  Administered 2015-08-16 (×2): 25 ug via INTRAVENOUS
  Administered 2015-08-16: 50 ug via INTRAVENOUS

## 2015-08-16 MED ORDER — BIVALIRUDIN 250 MG IV SOLR
INTRAVENOUS | Status: AC
  Filled 2015-08-16: qty 250

## 2015-08-16 MED ORDER — ONDANSETRON HCL 4 MG PO TABS: 4.0000 mg | ORAL_TABLET | Freq: Four times a day (QID) | ORAL | Status: DC | PRN

## 2015-08-16 MED ORDER — ACETAMINOPHEN 325 MG PO TABS
650.0000 mg | ORAL_TABLET | Freq: Four times a day (QID) | ORAL | Status: DC | PRN
  Administered 2015-08-17: 650 mg via ORAL
  Filled 2015-08-16 (×2): qty 2

## 2015-08-16 MED ORDER — BIVALIRUDIN BOLUS VIA INFUSION - CUPID
INTRAVENOUS | Status: DC | PRN
  Administered 2015-08-16: 67.725 mg via INTRAVENOUS

## 2015-08-16 MED ORDER — PREDNISONE 2.5 MG PO TABS
5.0000 mg | ORAL_TABLET | Freq: Two times a day (BID) | ORAL | Status: DC
  Administered 2015-08-17 – 2015-08-18 (×4): 5 mg via ORAL
  Filled 2015-08-16: qty 1
  Filled 2015-08-16 (×3): qty 2

## 2015-08-16 MED ORDER — SODIUM CHLORIDE 0.9 % IV SOLN
INTRAVENOUS | Status: DC | PRN
  Administered 2015-08-16: 1.75 mg/kg/h via INTRAVENOUS

## 2015-08-16 MED ORDER — ASPIRIN 81 MG PO CHEW: 81.0000 mg | CHEWABLE_TABLET | Freq: Every day | ORAL | Status: DC

## 2015-08-16 MED ORDER — ONDANSETRON HCL 4 MG/2ML IJ SOLN
4.0000 mg | Freq: Four times a day (QID) | INTRAMUSCULAR | Status: DC | PRN
Start: 2015-08-16 — End: 2015-08-16

## 2015-08-16 MED ORDER — ACETAMINOPHEN 325 MG PO TABS: 650.0000 mg | ORAL_TABLET | ORAL | Status: DC | PRN

## 2015-08-16 MED ORDER — SODIUM CHLORIDE 0.9 % IV SOLN
0.2500 mg/kg/h | INTRAVENOUS | Status: AC
  Administered 2015-08-16: 0.25 mg/kg/h via INTRAVENOUS
  Filled 2015-08-16: qty 250

## 2015-08-16 MED ORDER — MIDAZOLAM HCL 2 MG/2ML IJ SOLN
INTRAMUSCULAR | Status: DC | PRN
  Administered 2015-08-16 (×2): 1 mg via INTRAVENOUS

## 2015-08-16 MED ORDER — HEPARIN (PORCINE) IN NACL 2-0.9 UNIT/ML-% IJ SOLN
INTRAMUSCULAR | Status: AC
  Filled 2015-08-16: qty 500

## 2015-08-16 MED ORDER — SODIUM CHLORIDE 0.9 % IV SOLN
10.0000 mL/h | INTRAVENOUS | Status: DC
  Administered 2015-08-16 (×2): 20 mL/h via INTRAVENOUS

## 2015-08-16 MED ORDER — MIDAZOLAM HCL 2 MG/2ML IJ SOLN
INTRAMUSCULAR | Status: AC
  Filled 2015-08-16: qty 2

## 2015-08-16 MED ORDER — METOPROLOL TARTRATE 50 MG PO TABS: 50.0000 mg | ORAL_TABLET | Freq: Two times a day (BID) | ORAL | Status: DC

## 2015-08-16 MED ORDER — HEPARIN SODIUM (PORCINE) 5000 UNIT/ML IJ SOLN
4000.0000 [IU] | INTRAMUSCULAR | Status: AC
  Administered 2015-08-16: 4000 [IU] via INTRAVENOUS

## 2015-08-16 SURGICAL SUPPLY — 16 items
BALLN TREK RX 2.5X15 (BALLOONS) ×3
BALLOON TREK RX 2.5X15 (BALLOONS) ×1 IMPLANT
CATH INFINITI 5FR ANG PIGTAIL (CATHETERS) ×3 IMPLANT
CATH INFINITI 5FR JL4 (CATHETERS) ×3 IMPLANT
CATH INFINITI JR4 5F (CATHETERS) IMPLANT
CATH VISTA GUIDE 6FR JR4 SH (CATHETERS) ×3 IMPLANT
DEVICE CLOSURE MYNXGRIP 6/7F (Vascular Products) ×3 IMPLANT
DEVICE INFLAT 30 PLUS (MISCELLANEOUS) ×3 IMPLANT
KIT MANI 3VAL PERCEP (MISCELLANEOUS) ×3 IMPLANT
NEEDLE PERC 18GX7CM (NEEDLE) ×3 IMPLANT
PACK CARDIAC CATH (CUSTOM PROCEDURE TRAY) ×3 IMPLANT
SHEATH AVANTI 6FR X 11CM (SHEATH) ×3 IMPLANT
SHEATH PINNACLE 5F 10CM (SHEATH) IMPLANT
STENT XIENCE ALPINE RX 3.0X18 (Permanent Stent) ×3 IMPLANT
WIRE EMERALD 3MM-J .035X150CM (WIRE) ×3 IMPLANT
WIRE G HI TQ BMW 190 (WIRE) ×3 IMPLANT

## 2015-08-16 NOTE — ED Notes (Signed)
Cardiologist at bedside.  

## 2015-08-16 NOTE — ED Triage Notes (Signed)
Pt with CP since 2100 last night.

## 2015-08-16 NOTE — Consult Note (Signed)
Reason for Consult:STEMI acute inferolateral  Referring Physician: Dr. Jacinto Reap oncology  Robert Short is an 65 y.o. male.  HPI: Patient is a known history of a ASCVDsclerotic vascular disease prostate cancer with metastatic lesions to the bone mild coronary disease hypertension and smoking obesity who started having significant chest discomfort and back pain neck pain since yesterday. The wife states that he has daily pain but this was different because he got diaphoretic pain radiated to his arms complains of weakness and the pain was 15/10. The patient finally asked to have rescue called and he came in via EMS. In EMS truck EKG was obtained which showed ST elevation inferiorly suggestive STEMI she was brought to [facility] and cardiology consultation was recommended emergently. Patient has history of smoking and hypertension and has had cardiac catheter in the past which showed mild disease. His mandible treated for his prostate cancer and bone cancer recently followed by oncology  Past Medical History:  Diagnosis Date  . ASCVD (arteriosclerotic cardiovascular disease)   . Back pain   . Back pain 10/09/2012  . Bone cancer ([place])   . Depression   . History of kidney stones   . Hypertension   . Joint pain   . Prostate cancer ([place])   . SOB (shortness of breath) 10/09/2012  . Unable to ambulate 10/09/2012    Past Surgical History:  Procedure Laterality Date  . CARDIAC CATHETERIZATION     [facility]  . CERVIX SURGERY    . PROSTATECTOMY    . SPINE SURGERY    . TONSILLECTOMY    . WRIST SURGERY      Family History  Problem Relation Age of Onset  . Heart attack Mother   . Hypertension Mother   . Heart attack Father     Social History:  reports that he has been smoking Cigarettes.  He has a 40.00 pack-year smoking history. He has never used smokeless tobacco. He reports that he does not drink alcohol or use drugs.  Allergies: No Known Allergies  Medications: I have reviewed the patient's current  medications.  Results for orders placed or performed during the hospital encounter of 08/16/15 (from the past 48 hour(s))  CBC     Status: Abnormal   Collection Time: 08/16/15  4:26 PM  Result Value Ref Range   WBC 5.6 3.8 - 10.6 K/uL   RBC 4.10 (L) 4.40 - 5.90 MIL/uL   Hemoglobin 13.3 13.0 - 18.0 g/dL   HCT 38.5 (L) 40.0 - 52.0 %   MCV 93.9 80.0 - 100.0 fL   MCH 32.5 26.0 - 34.0 pg   MCHC 34.6 32.0 - 36.0 g/dL   RDW 14.6 (H) 11.5 - 14.5 %   Platelets 146 (L) 150 - 440 K/uL  Differential     Status: Abnormal   Collection Time: 08/16/15  4:26 PM  Result Value Ref Range   Neutrophils Relative % 78 %   Neutro Abs 4.4 1.4 - 6.5 K/uL   Lymphocytes Relative 11 %   Lymphs Abs 0.6 (L) 1.0 - 3.6 K/uL   Monocytes Relative 10 %   Monocytes Absolute 0.6 0.2 - 1.0 K/uL   Eosinophils Relative 1 %   Eosinophils Absolute 0.0 0 - 0.7 K/uL   Basophils Relative 0 %   Basophils Absolute 0.0 0 - 0.1 K/uL  Protime-INR     Status: None   Collection Time: 08/16/15  4:26 PM  Result Value Ref Range   Prothrombin Time 12.6 11.4 - 15.2 seconds  INR 0.94   APTT     Status: None   Collection Time: 08/16/15  4:26 PM  Result Value Ref Range   aPTT 33 24 - 36 seconds  Comprehensive metabolic panel     Status: Abnormal   Collection Time: 08/16/15  4:26 PM  Result Value Ref Range   Sodium 139 135 - 145 mmol/L   Potassium 3.6 3.5 - 5.1 mmol/L   Chloride 105 101 - 111 mmol/L   CO2 24 22 - 32 mmol/L   Glucose, Bld 171 (H) 65 - 99 mg/dL   BUN 12 6 - 20 mg/dL   Creatinine, Ser 0.82 0.61 - 1.24 mg/dL   Calcium 9.7 8.9 - 10.3 mg/dL   Total Protein 6.7 6.5 - 8.1 g/dL   Albumin 4.3 3.5 - 5.0 g/dL   AST 19 15 - 41 U/L   ALT 15 (L) 17 - 63 U/L   Alkaline Phosphatase 35 (L) 38 - 126 U/L   Total Bilirubin 0.6 0.3 - 1.2 mg/dL   GFR calc non Af Amer >60 >60 mL/min   GFR calc Af Amer >60 >60 mL/min    Comment: (NOTE) The eGFR has been calculated using the CKD EPI equation. This calculation has not been  validated in all clinical situations. eGFR's persistently <60 mL/min signify possible Chronic Kidney Disease.    Anion gap 10 5 - 15  Troponin I     Status: Abnormal   Collection Time: 08/16/15  4:26 PM  Result Value Ref Range   Troponin I 0.03 (HH) <0.03 ng/mL    Comment: CRITICAL RESULT CALLED TO, READ BACK BY AND VERIFIED WITH HAL HERSHENSON AT 1710 08/16/2015 BY TFK.   Lipid panel     Status: Abnormal   Collection Time: 08/16/15  4:26 PM  Result Value Ref Range   Cholesterol 226 (H) 0 - 200 mg/dL   Triglycerides 199 (H) <150 mg/dL   HDL 45 >40 mg/dL   Total CHOL/HDL Ratio 5.0 RATIO   VLDL 40 0 - 40 mg/dL   LDL Cholesterol 141 (H) 0 - 99 mg/dL    Comment:        Total Cholesterol/HDL:CHD Risk Coronary Heart Disease Risk Table                     Men   Women  1/2 Average Risk   3.4   3.3  Average Risk       5.0   4.4  2 X Average Risk   9.6   7.1  3 X Average Risk  23.4   11.0        Use the calculated Patient Ratio above and the CHD Risk Table to determine the patient's CHD Risk.        ATP III CLASSIFICATION (LDL):  <100     mg/dL   Optimal  100-129  mg/dL   Near or Above                    Optimal  130-159  mg/dL   Borderline  160-189  mg/dL   High  >190     mg/dL   Very High     Dg Chest Port 1 View  Result Date: 08/16/2015 CLINICAL DATA:  Chest pain EXAM: PORTABLE CHEST 1 VIEW COMPARISON:  02/11/2015 FINDINGS: Cardiac shadow is within normal limits. The lungs are clear bilaterally. No sizable effusion is seen. Bilateral nipple shadows are seen. Additionally some suggestion of focal nodules are  noted bilaterally. Repeat CT may be helpful for further evaluation on a nonemergent basis. No other focal abnormality is seen. No significant mediastinal widening is noted. IMPRESSION: Questionable nodules bilaterally. Noncontrast CT is recommended when able. Electronically Signed   By: Inez Catalina M.D.   On: 08/16/2015 16:42    Review of Systems  Constitutional:  Positive for diaphoresis and malaise/fatigue.  HENT: Positive for congestion.   Eyes: Negative.   Respiratory: Positive for shortness of breath.   Cardiovascular: Positive for chest pain.  Gastrointestinal: Positive for nausea.  Genitourinary: Negative.   Musculoskeletal: Positive for back pain, myalgias and neck pain.  Skin: Negative.   Neurological: Positive for weakness.  Endo/Heme/Allergies: Negative.   Psychiatric/Behavioral: Positive for depression.   Blood pressure (!) 145/80, pulse 71, temperature 98.6 F (37 C), temperature source Oral, resp. rate (!) 26, weight 90.3 kg (199 lb), SpO2 100 %. Physical Exam  Nursing note and vitals reviewed. Constitutional: He is oriented to person, place, and time. He appears well-developed and well-nourished.  HENT:  Head: Normocephalic and atraumatic.  Eyes: Conjunctivae and EOM are normal. Pupils are equal, round, and reactive to light.  Neck: Normal range of motion. Neck supple.  Cardiovascular: Normal rate, regular rhythm and normal heart sounds.   Respiratory: Effort normal and breath sounds normal.  GI: Soft. Bowel sounds are normal.  Musculoskeletal: Normal range of motion.  Neurological: He is alert and oriented to person, place, and time. He has normal reflexes.  Skin: Skin is warm and dry.  Psychiatric: He has a normal mood and affect.    Assessment/Plan: STEMI inferior wall Abnormal EKG Chronic back and neck pain Metastatic prostate cancer to the bone Obesity mild Coronary disease Depression Smoking Fatigue . Plan Recommend take the patient directly to cardiac catheter lab for cardiac catheter PCI and stent Anticoagulation currently on heparin was used Angiomax Aspirin therapy long-term for 2 sclerotic vascular disease secondary prevention Advised patient to quit smoking Statin therapy Lipitor 80 mg a day Beta-blockade therapy for known coronary disease and now with STEMI ACE inhibitor his renal function is  stable Fluid hydration with inferior wall myocardial infarction Consult hospitalist and hopefully transfer the care to the service for chronic pain and multiple medical problems Consult oncology Dr B to follow up with patient Pain control for neck and back discomfort CALLWOOD,DWAYNE D. 08/16/2015, 6:20 PM

## 2015-08-16 NOTE — Telephone Encounter (Signed)
Per Dr B, stop Zytiga and will see him tomorrow as planned. Mrs Guderian informed and repeated back to me

## 2015-08-16 NOTE — ED Provider Notes (Signed)
[facility] Emergency Department Provider Note    First MD Initiated Contact with Patient 08/16/15 1619     (approximate)  I have reviewed the triage vital signs and the nursing notes.   HISTORY  Chief Complaint No chief complaint on file.    HPI Robert Short is a 65 y.o. male with history of prostate cancer with metastasis to his back who presents to the ER as a code STEMI. Patient initially called EMS due to concern for an allergic reaction to new chemotherapy medication he is on. Patient reported feeling short of breath and having pain in bilateral shoulders that started right around 11 AM. Pain did not improve. Patient denies any anterior chest wall pain. Denies any ripping or tearing pain. His back. Denies any numbness or tingling. Denies any previous history of coronary disease. Pain is currently a 10 out of 10 in severity most prominent in his right shoulder. EKG was performed prehospital shows evidence of acute inferior ST elevation MI.   Past Medical History:  Diagnosis Date  . ASCVD (arteriosclerotic cardiovascular disease)   . Back pain   . Back pain 10/09/2012  . Bone cancer ([place])   . Depression   . History of kidney stones   . Hypertension   . Joint pain   . Prostate cancer ([place])   . SOB (shortness of breath) 10/09/2012  . Unable to ambulate 10/09/2012    Patient Active Problem List   Diagnosis Date Noted  . Neoplasm related pain 06/22/2015  . Cancer associated pain 02/04/2015  . Sebaceous cyst 01/18/2015  . Chronic neck pain 12/08/2014  . Cervical spondylosis 12/08/2014  . Chronic low back pain 12/08/2014  . Lumbar spondylosis 12/08/2014  . Chronic shoulder pain 12/08/2014  . Prostate cancer metastatic to bone ([place]) 12/08/2014  . Cancer-related pain 12/08/2014  . Neurogenic pain 12/08/2014  . Musculoskeletal pain 12/08/2014  . Chronic pain 12/07/2014  . Long term current use of opiate analgesic 12/07/2014  . Long term  prescription opiate use 12/07/2014  . Opiate use 12/07/2014  . Encounter for therapeutic drug level monitoring 12/07/2014  . Encounter for chronic pain management 12/07/2014  . Therapeutic opioid-induced constipation (OIC) 12/07/2014  . Fatigue 08/27/2014  . CFIDS (chronic fatigue and immune dysfunction syndrome) 06/04/2014  . Cervical spinal stenosis 06/04/2014  . Clinical depression 06/04/2014  . Essential (primary) hypertension 06/04/2014  . HLD (hyperlipidemia) 06/04/2014  . Amnesia 06/04/2014  . Chronic cervical pain 06/04/2014  . Neuropathy ([place]) 06/04/2014  . Loss of feeling or sensation 06/04/2014  . Blood glucose elevated 06/04/2014  . Pain in shoulder 06/04/2014  . Compulsive tobacco user syndrome 06/04/2014  . CAD (coronary artery disease) 10/09/2012  . Smoker 10/09/2012  . ED (erectile dysfunction) of organic origin 08/08/2012  . Acid reflux 08/08/2012  . Lower urinary tract symptoms 08/08/2012  . Prostate cancer 08/01/2010  . Restless leg 08/01/2010  . Malignant neoplasm of prostate ([place]) 08/01/2010  . Back ache 08/01/2010    Past Surgical History:  Procedure Laterality Date  . CARDIAC CATHETERIZATION     [facility]  . CERVIX SURGERY    . PROSTATECTOMY    . SPINE SURGERY    . TONSILLECTOMY    . WRIST SURGERY      Prior to Admission medications   Medication Sig Start Date End Date Taking? Authorizing Provider  abiraterone Acetate (ZYTIGA) 250 MG tablet Take 4 tablets (1,000 mg total) by mouth daily. Take on an empty stomach 1 hour  before or 2 hours after a meal 07/26/15   [name], MD  acetaminophen (TYLENOL) 500 MG tablet Take 1,500 mg by mouth 2 (two) times daily.  12/03/12   Historical Provider, MD  amLODipine (NORVASC) 5 MG tablet Take 1/2 tablet daily 03/22/15   Arlis Porta., MD  aspirin 81 MG tablet Take 81 mg by mouth daily.    Historical Provider, MD  baclofen (LIORESAL) 20 MG tablet TAKE 1 TABLET BY MOUTH TWICE DAILY 06/11/15   Amy Overton Mam, NP  cyclobenzaprine (FLEXERIL) 5 MG tablet Take 1 tablet (5 mg total) by mouth 3 (three) times daily as needed for muscle spasms. 06/24/15   Jenise V Bacon Menshew, PA-C  dexamethasone (DECADRON) 4 MG tablet 1 pill every 12 hours with food. Patient not taking: Reported on 07/21/2015 06/01/15   [name], MD  enzalutamide Gillermina Phy) 40 MG capsule Take 4 capsules (160 mg total) by mouth daily. Patient not taking: Reported on 07/21/2015 07/20/15   [name], MD  ibuprofen (ADVIL,MOTRIN) 200 MG tablet Take 800 mg by mouth every 8 (eight) hours as needed.     Historical Provider, MD  ketorolac (TORADOL) 10 MG tablet Take 1 tablet (10 mg total) by mouth every 8 (eight) hours. 06/24/15   Jenise V Bacon Menshew, PA-C  losartan (COZAAR) 100 MG tablet Take 1/2 tablet twice a day. Patient taking differently: 100 mg. Take 1/2 tablet twice a day. 07/14/14   Arlis Porta., MD  metoprolol succinate (TOPROL-XL) 50 MG 24 hr tablet Take 1 tablet (50 mg total) by mouth daily. Take with or immediately following a meal. 06/04/14   Arlis Porta., MD  oxyCODONE (OXYCONTIN) 40 mg 12 hr tablet Take 1 tablet (40 mg total) by mouth every 8 (eight) hours. 07/20/15   [name], MD  Oxycodone HCl 10 MG TABS One pill every 6- 8 hours as needed for pain 07/20/15   [name], MD  predniSONE (DELTASONE) 5 MG tablet Take 1 tablet (5 mg total) by mouth 2 (two) times daily with a meal. Take daily with Zytiga 08/05/15   [name], MD  Wheat Dextrin (BENEFIBER) POWD Stir 2 tsp. TID into 4-8 oz of any non-carbonated beverage or soft food (hot or cold) 12/07/14   Milinda Pointer, MD    Allergies Review of patient's allergies indicates no known allergies.  Family History  Problem Relation Age of Onset  . Heart attack Mother   . Hypertension Mother   . Heart attack Father     Social History Social History  Substance Use Topics  . Smoking status: Heavy Tobacco Smoker     Packs/day: 1.00    Years: 40.00    Types: Cigarettes  . Smokeless tobacco: Never Used     Comment: "I use a pack a day in 24 hours. I have quit and started in past."  . Alcohol use No    Review of Systems Patient denies headaches, rhinorrhea, blurry vision, numbness, shortness of breath, chest pain, edema, cough, abdominal pain, nausea, vomiting, diarrhea, dysuria, fevers, rashes or hallucinations unless otherwise stated above in HPI. ____________________________________________   PHYSICAL EXAM:  VITAL SIGNS: Vitals:   08/16/15 1631 08/16/15 1632  BP: (!) 145/80   Pulse: 71   Resp: (!) 26   Temp:  98.6 F (37 C)    Constitutional: Alert and oriented. Ill appearing. Eyes: Conjunctivae are normal. PERRL. EOMI. Head: Atraumatic. Nose: No congestion/rhinnorhea. Mouth/Throat: Mucous membranes  are moist.  Oropharynx non-erythematous. Neck: No stridor. Painless ROM. No cervical spine tenderness to palpation Hematological/Lymphatic/Immunilogical: No cervical lymphadenopathy. Cardiovascular: Normal rate, regular rhythm. Grossly normal heart sounds.  Good peripheral circulation.  His equal pulses bilaterally. Respiratory: Normal respiratory effort.  No retractions. Lungs CTAB. Gastrointestinal: Soft and nontender. No distention. No abdominal bruits. No CVA tenderness. Musculoskeletal: No lower extremity tenderness nor edema.  No joint effusions. Neurologic:  Normal speech and language. No gross focal neurologic deficits are appreciated. No gait instability. Skin:  Skin is warm, dry and intact. No rash noted.  ____________________________________________   LABS (all labs ordered are listed, but only abnormal results are displayed)  No results found for this or any previous visit (from the past 24 hour(s)). ____________________________________________  EKG My interpretation at Time: 15:58   Indication: chest pain  Rate: 70  Rhythm: sinus Axis: normal Other: acute Inferior  STEMI ____________________________________________  RADIOLOGY  CXR my read shows no evidence of acute cardiopulmonary process.  ____________________________________________   PROCEDURES  Procedure(s) performed: none    Critical Care performed: yes CRITICAL CARE Performed by: Merlyn Lot   Total critical care time: 40 minutes  Critical care time was exclusive of separately billable procedures and treating other patients.  Critical care was necessary to treat or prevent imminent or life-threatening deterioration.  Critical care was time spent personally by me on the following activities: development of treatment plan with patient and/or surrogate as well as nursing, discussions with consultants, evaluation of patient's response to treatment, examination of patient, obtaining history from patient or surrogate, ordering and performing treatments and interventions, ordering and review of laboratory studies, ordering and review of radiographic studies, pulse oximetry and re-evaluation of patient's condition.  ____________________________________________   INITIAL IMPRESSION / ASSESSMENT AND PLAN / ED COURSE  Pertinent labs & imaging results that were available during my care of the patient were reviewed by me and considered in my medical decision making (see chart for details).  DDX: ACS, dissection, pneumonia, pericarditis  Robert Short is a 66 y.o. who presents to the ED with right shoulder pain and EKG evidence of inferior ST elevation MI. Patient hemodynamic stable but in obvious distress and diaphoretic. Repeat EKG in the ED does show inferior distribution ST elevation MI. Patient provided aspirin and loaded with heparin. Have called code STEMI.  Clinical Course  Comment By Time  Chest x-ray without evidence of mediastinal widening. Presentation was consistent with dissection. Cardiology still has not responded. Will repeat. States his pain is still not had 10 in  severity. Had some improvement with fentanyl. Merlyn Lot, MD 08/14 1639  Dr. Clayborn Bigness now at bedside.  Patient remains hemodynamically stable.  Patient given a delay in contacting provider due to paging system no further medication request at this time. Patient will be taken emergently to catheter lab for PCI.  Merlyn Lot, MD 08/14 1645     ____________________________________________   FINAL CLINICAL IMPRESSION(S) / ED DIAGNOSES  Final diagnoses:  ST elevation myocardial infarction involving right coronary artery (HCC)      NEW MEDICATIONS STARTED DURING THIS VISIT:  New Prescriptions   No medications on file     Note:  This document was prepared using Dragon voice recognition software and may include unintentional dictation errors.    Merlyn Lot, MD 08/16/15 [phone]

## 2015-08-16 NOTE — Progress Notes (Signed)
Patient's heart rate was sustaining in the 70's upon arrival. Patient is now dipping down into the upper 30's and low 40's about every 5 minutes. Patient asymptomatic. Cardiology was notified. No new orders given. Will continue to monitor.

## 2015-08-16 NOTE — H&P (Signed)
[place] at [place] NAME: Robert Short    MR#:  [id number]  DATE OF BIRTH:  1950-06-14   DATE OF ADMISSION:  08/16/2015  PRIMARY CARE PHYSICIAN: [name], MD   REQUESTING/REFERRING PHYSICIAN: Quentin Cornwall  CHIEF COMPLAINT:   Chief Complaint  Patient presents with  . Code STEMI    HISTORY OF PRESENT ILLNESS:  Robert Short  is a 65 y.o. male with a known history of metastatic prostate cancer who presented with acute on chronic back pain, described as "pain" 9/10, radiation in the neck, no worsening/relieving, no associated symptoms. EKG reveal STEMI - taken emergently to the cath lab - DES to RCA. Patient now in stepdown. Has complaints of continued back pain  PAST MEDICAL HISTORY:   Past Medical History:  Diagnosis Date  . ASCVD (arteriosclerotic cardiovascular disease)   . Back pain   . Back pain 10/09/2012  . Bone cancer ([place])   . Depression   . History of kidney stones   . Hypertension   . Joint pain   . Prostate cancer ([place])   . SOB (shortness of breath) 10/09/2012  . Unable to ambulate 10/09/2012    PAST SURGICAL HISTORY:   Past Surgical History:  Procedure Laterality Date  . CARDIAC CATHETERIZATION     [facility]  . CERVIX SURGERY    . PROSTATECTOMY    . SPINE SURGERY    . TONSILLECTOMY    . WRIST SURGERY      SOCIAL HISTORY:   Social History  Substance Use Topics  . Smoking status: Heavy Tobacco Smoker    Packs/day: 1.00    Years: 40.00    Types: Cigarettes  . Smokeless tobacco: Never Used     Comment: "I use a pack a day in 24 hours. I have quit and started in past."  . Alcohol use No    FAMILY HISTORY:   Family History  Problem Relation Age of Onset  . Heart attack Mother   . Hypertension Mother   . Heart attack Father     DRUG ALLERGIES:  No Known Allergies  REVIEW OF SYSTEMS:  REVIEW OF SYSTEMS:  CONSTITUTIONAL: Denies fevers, chills, fatigue, weakness.  EYES: Denies blurred vision, double  vision, or eye pain.  EARS, NOSE, THROAT: Denies tinnitus, ear pain, hearing loss.  RESPIRATORY: denies cough, shortness of breath, wheezing  CARDIOVASCULAR: Denies chest pain, palpitations, edema.  GASTROINTESTINAL: Denies nausea, vomiting, diarrhea, abdominal pain.  GENITOURINARY: Denies dysuria, hematuria.  ENDOCRINE: Denies nocturia or thyroid problems. HEMATOLOGIC AND LYMPHATIC: Denies easy bruising or bleeding.  SKIN: Denies rash or lesions.  MUSCULOSKELETAL: positive back pain, Denies pain in neck, , shoulder, knees, hips, or further arthritic symptoms.  NEUROLOGIC: Denies paralysis, paresthesias.  PSYCHIATRIC: Denies anxiety or depressive symptoms. Otherwise full review of systems performed by me is negative.   MEDICATIONS AT HOME:   Prior to Admission medications   Medication Sig Start Date End Date Taking? Authorizing Provider  abiraterone Acetate (ZYTIGA) 250 MG tablet Take 4 tablets (1,000 mg total) by mouth daily. Take on an empty stomach 1 hour before or 2 hours after a meal 07/26/15  Yes [name], MD  acetaminophen (TYLENOL) 500 MG tablet Take 1,500 mg by mouth every 8 (eight) hours as needed.  12/03/12  Yes Historical Provider, MD  baclofen (LIORESAL) 20 MG tablet TAKE 1 TABLET BY MOUTH TWICE DAILY 06/11/15  Yes Amy Overton Mam, NP  losartan (COZAAR) 100 MG tablet Take 1/2  tablet twice a day. Patient taking differently: 100 mg. Take 1/2 tablet twice a day. 07/14/14  Yes Arlis Porta., MD  metoprolol (LOPRESSOR) 50 MG tablet Take 50 mg by mouth 2 (two) times daily.   Yes Historical Provider, MD  oxyCODONE (OXYCONTIN) 40 mg 12 hr tablet Take 1 tablet (40 mg total) by mouth every 8 (eight) hours. 07/20/15  Yes [name], MD  Oxycodone HCl 10 MG TABS One pill every 6- 8 hours as needed for pain Patient taking differently: Take 10 mg by mouth as needed. One pill every 6- 8 hours as needed for pain 07/20/15  Yes [name], MD  predniSONE  (DELTASONE) 5 MG tablet Take 1 tablet (5 mg total) by mouth 2 (two) times daily with a meal. Take daily with Zytiga 08/05/15  Yes [name], MD  aspirin 81 MG tablet Take 81 mg by mouth daily.    Historical Provider, MD  cyclobenzaprine (FLEXERIL) 5 MG tablet Take 1 tablet (5 mg total) by mouth 3 (three) times daily as needed for muscle spasms. Patient not taking: Reported on 08/16/2015 06/24/15   Dannielle Karvonen Menshew, PA-C  dexamethasone (DECADRON) 4 MG tablet 1 pill every 12 hours with food. Patient not taking: Reported on 07/21/2015 06/01/15   [name], MD  enzalutamide Gillermina Phy) 40 MG capsule Take 4 capsules (160 mg total) by mouth daily. Patient not taking: Reported on 07/21/2015 07/20/15   [name], MD  ibuprofen (ADVIL,MOTRIN) 200 MG tablet Take 800 mg by mouth every 8 (eight) hours as needed.     Historical Provider, MD  ketorolac (TORADOL) 10 MG tablet Take 1 tablet (10 mg total) by mouth every 8 (eight) hours. 06/24/15   Jenise V Bacon Menshew, PA-C  Wheat Dextrin (BENEFIBER) POWD Stir 2 tsp. TID into 4-8 oz of any non-carbonated beverage or soft food (hot or cold) 12/07/14   Milinda Pointer, MD      VITAL SIGNS:  Blood pressure (!) 119/57, pulse 65, temperature 98.6 F (37 C), temperature source Oral, resp. rate (!) 26, height 5\' 11"  (1.803 m), weight 90 kg (198 lb 6.6 oz), SpO2 100 %.  PHYSICAL EXAMINATION:  VITAL SIGNS: Vitals:   08/16/15 1900 08/16/15 1953  BP: (!) 131/57 (!) 119/57  Pulse: (!) 52 65  Resp: 13 (!) 26  Temp: 98.6 F (37 C)    GENERAL:65 y.o.male currently in mild acute distress given pain HEAD: Normocephalic, atraumatic.  EYES: Pupils equal, round, reactive to light. Extraocular muscles intact. No scleral icterus.  MOUTH: Moist mucosal membrane. Dentition intact. No abscess noted.  EAR, NOSE, THROAT: Clear without exudates. No external lesions.  NECK: Supple. No thyromegaly. No nodules. No JVD.  PULMONARY: Clear to  ascultation, without wheeze rails or rhonci. No use of accessory muscles, Good respiratory effort. good air entry bilaterally CHEST: Nontender to palpation.  CARDIOVASCULAR: S1 and S2. Regular rate and rhythm. No murmurs, rubs, or gallops. No edema. Pedal pulses 2+ bilaterally.  GASTROINTESTINAL: Soft, nontender, nondistended. No masses. Positive bowel sounds. No hepatosplenomegaly.  MUSCULOSKELETAL: No swelling, clubbing, or edema. Range of motion full in all extremities.  NEUROLOGIC: Cranial nerves II through XII are intact. No gross focal neurological deficits. Sensation intact. Reflexes intact.  SKIN: No ulceration, lesions, rashes, or cyanosis. Skin warm and dry. Turgor intact.  PSYCHIATRIC: Mood, affect within normal limits. The patient is awake, alert and oriented x 3. Insight, judgment intact.    LABORATORY PANEL:   CBC  Recent Labs Lab 08/16/15 1626  WBC 5.6  HGB 13.3  HCT 38.5*  PLT 146*   ------------------------------------------------------------------------------------------------------------------  Chemistries   Recent Labs Lab 08/16/15 1626  NA 139  K 3.6  CL 105  CO2 24  GLUCOSE 171*  BUN 12  CREATININE 0.82  CALCIUM 9.7  AST 19  ALT 15*  ALKPHOS 35*  BILITOT 0.6   ------------------------------------------------------------------------------------------------------------------  Cardiac Enzymes  Recent Labs Lab 08/16/15 1626  TROPONINI 0.03*   ------------------------------------------------------------------------------------------------------------------  RADIOLOGY:  Dg Chest Port 1 View  Result Date: 08/16/2015 CLINICAL DATA:  Chest pain EXAM: PORTABLE CHEST 1 VIEW COMPARISON:  02/11/2015 FINDINGS: Cardiac shadow is within normal limits. The lungs are clear bilaterally. No sizable effusion is seen. Bilateral nipple shadows are seen. Additionally some suggestion of focal nodules are noted bilaterally. Repeat CT may be helpful for further  evaluation on a nonemergent basis. No other focal abnormality is seen. No significant mediastinal widening is noted. IMPRESSION: Questionable nodules bilaterally. Noncontrast CT is recommended when able. Electronically Signed   By: Inez Catalina M.D.   On: 08/16/2015 16:42    EKG:   Orders placed or performed during the hospital encounter of 08/16/15  . ED EKG  . ED EKG  . EKG 12-Lead immediately post procedure  . EKG 12-Lead  . EKG 12-Lead immediately post procedure    IMPRESSION AND PLAN:   65 year old male with history of metastatic prostate cancer presenting with STEMI  1. STEMI: status post des to rca, asa, statin, cardiology, telemetry, brilinta, hold beta blocker given transient bradycardia - start when tolerated 2.metastic prostate cancer: pain control 3. Essential hypertension: losartan, hold betablocker for heart rate     All the records are reviewed and case discussed with ED provider. Management plans discussed with the patient, family and they are in agreement.  CODE STATUS: full  TOTAL TIME TAKING CARE OF THIS PATIENT: 45 minutes.    Hower,  Karenann Cai.D on 08/16/2015 at 7:54 PM  Between 7am to 6pm - Pager - [phone]  After 6pm: House Pager: - [phone]  [facility]  Office  [phone]  CC: Primary care physician; [name], MD

## 2015-08-16 NOTE — ED Notes (Signed)
Pt to cath lab.

## 2015-08-16 NOTE — Telephone Encounter (Signed)
Started Zytiga last Wednesday. Having extreme muscle tightness/ pain, does not want to move. Mostly in neck and arms and back. Causing a headache. Please advise of what to do for him. He does have an appt tomorrow

## 2015-08-17 ENCOUNTER — Inpatient Hospital Stay: Payer: 59 | Admitting: Internal Medicine

## 2015-08-17 ENCOUNTER — Encounter: Payer: Self-pay | Admitting: Internal Medicine

## 2015-08-17 ENCOUNTER — Inpatient Hospital Stay
Admit: 2015-08-17 | Discharge: 2015-08-17 | Disposition: A | Discharge status: Home / Self Care | Payer: 59 | Attending: Internal Medicine | Admitting: Internal Medicine

## 2015-08-17 ENCOUNTER — Inpatient Hospital Stay: Payer: 59

## 2015-08-17 LAB — BASIC METABOLIC PANEL
Anion gap: 6 (ref 5–15)
BUN: 10 mg/dL (ref 6–20)
CALCIUM: 9.2 mg/dL (ref 8.9–10.3)
CO2: 26 mmol/L (ref 22–32)
CREATININE: 0.69 mg/dL (ref 0.61–1.24)
Chloride: 110 mmol/L (ref 101–111)
GFR calc non Af Amer: >60 mL/min (ref >60)
Glucose, Bld: 77 mg/dL (ref 65–99)
Potassium: 3.7 mmol/L (ref 3.5–5.1)
SODIUM: 142 mmol/L (ref 135–145)

## 2015-08-17 LAB — CBC
HCT: 38.1 % — ABNORMAL LOW (ref 40.0–52.0)
Hemoglobin: 13.2 g/dL (ref 13.0–18.0)
MCH: 32.5 pg (ref 26.0–34.0)
MCHC: 34.7 g/dL (ref 32.0–36.0)
MCV: 93.7 fL (ref 80.0–100.0)
PLATELETS: 141 10*3/uL — AB (ref 150–440)
RBC: 4.07 MIL/uL — AB (ref 4.40–5.90)
RDW: 14.6 % — ABNORMAL HIGH (ref 11.5–14.5)
WBC: 5 10*3/uL (ref 3.8–10.6)

## 2015-08-17 LAB — TROPONIN I: TROPONIN I: 0.3 ng/mL — AB (ref <0.03)

## 2015-08-17 LAB — CARDIAC CATHETERIZATION: Cath EF Quantitative: 50 %

## 2015-08-17 MED ORDER — ATORVASTATIN CALCIUM 20 MG PO TABS
80.0000 mg | ORAL_TABLET | Freq: Every day | ORAL | Status: DC
  Administered 2015-08-17 – 2015-08-18 (×2): 80 mg via ORAL
  Filled 2015-08-17 (×2): qty 4

## 2015-08-17 NOTE — Progress Notes (Signed)
*  PRELIMINARY RESULTS* Echocardiogram 2D Echocardiogram has been performed.  Robert Short 08/17/2015, 3:44 PM

## 2015-08-17 NOTE — Care Management Note (Signed)
Case Management Note  Patient Details  Name: Robert Short MRN: [id number] Date of Birth: 1950-07-22  Subjective/Objective:                  Met with patient and his wife to discuss discharge planning. He is from home with his wife- independent with mobility and able to drive. His PCP is Dr. Luan Pulling in [place]. He has no O2 or other DME available in the home. He denies problems obtaining medications.   Action/Plan: No anticipated RNCM needs.   Expected Discharge Date:                  Expected Discharge Plan:     In-House Referral:     Discharge planning Services  CM Consult  Post Acute Care Choice:    Choice offered to:  Patient, Spouse  DME Arranged:    DME Agency:     HH Arranged:    [facility] Agency:     Status of Service:  In process, will continue to follow  If discussed at Long Length of Stay Meetings, dates discussed:    Additional Comments:  Marshell Garfinkel, RN 08/17/2015, 11:33 AM

## 2015-08-17 NOTE — Progress Notes (Signed)
[place] at [facility]                                                                                                                                                                                            Patient Demographics   Robert Short, is a 65 y.o. male, DOB - 1950/09/05, [id number]  Admit date - 08/16/2015   Admitting Physician Yolonda Kida, MD  Outpatient Primary MD for the patient is [name], MD   LOS - 1  Subjective:Patient admitted with non-ST MI complains of headache currently other than that denies any chest pain or shortness of breath Also has chronic back trouble     Review of Systems:   CONSTITUTIONAL: No documented fever. No fatigue, weakness. No weight gain, no weight loss.  EYES: No blurry or double vision.  ENT: No tinnitus. No postnasal drip. No redness of the oropharynx.  RESPIRATORY: No cough, no wheeze, no hemoptysis. No dyspnea.  CARDIOVASCULAR: No chest pain. No orthopnea. No palpitations. No syncope.  GASTROINTESTINAL: No nausea, no vomiting or diarrhea. No abdominal pain. No melena or hematochezia.  GENITOURINARY: No dysuria or hematuria.  ENDOCRINE: No polyuria or nocturia. No heat or cold intolerance.  HEMATOLOGY: No anemia. No bruising. No bleeding.  INTEGUMENTARY: No rashes. No lesions.  MUSCULOSKELETAL: No arthritis. No swelling. No gout.  NEUROLOGIC: No numbness, tingling, or ataxia. No seizure-type activity. Positive for headache PSYCHIATRIC: No anxiety. No insomnia. No ADD.    Vitals:   Vitals:   08/17/15 0500 08/17/15 0700 08/17/15 0800 08/17/15 1100  BP: 122/64 139/63 (!) 100/57   Pulse: (!) 59 69 (!) 59   Resp: 14 16 (!) 21   Temp: 98.1 F (36.7 C)  98.4 F (36.9 C) 98.6 F (37 C)  TempSrc: Oral  Oral Oral  SpO2: 100% 100% 98% 98%  Weight:      Height:        Wt Readings from Last 3 Encounters:  08/16/15 90 kg (198 lb 6.6 oz)  07/21/15 90.4 kg (199 lb 3 oz)  07/20/15  91.3 kg (201 lb 4.5 oz)     Intake/Output Summary (Last 24 hours) at 08/17/15 1414 Last data filed at 08/17/15 1200  Gross per 24 hour  Intake           187.67 ml  Output              850 ml  Net          -662.33 ml    Physical Exam:   GENERAL: Pleasant-appearing in no apparent distress.  HEAD, EYES, EARS, NOSE  AND THROAT: Atraumatic, normocephalic. Extraocular muscles are intact. Pupils equal and reactive to light. Sclerae anicteric. No conjunctival injection. No oro-pharyngeal erythema.  NECK: Supple. There is no jugular venous distention. No bruits, no lymphadenopathy, no thyromegaly.  HEART: Regular rate and rhythm,. No murmurs, no rubs, no clicks.  LUNGS: Clear to auscultation bilaterally. No rales or rhonchi. No wheezes.  ABDOMEN: Soft, flat, nontender, nondistended. Has good bowel sounds. No hepatosplenomegaly appreciated.  EXTREMITIES: No evidence of any cyanosis, clubbing, or peripheral edema.  +2 pedal and radial pulses bilaterally.  NEUROLOGIC: The patient is alert, awake, and oriented x3 with no focal motor or sensory deficits appreciated bilaterally.  SKIN: Moist and warm with no rashes appreciated.  Psych: Not anxious, depressed LN: No inguinal LN enlargement    Antibiotics   Anti-infectives    None      Medications   Scheduled Meds: . abiraterone Acetate  1,000 mg Oral Daily  . aspirin EC  81 mg Oral Daily  . atorvastatin  80 mg Oral q1800  . baclofen  20 mg Oral BID  . losartan  100 mg Oral Daily  . oxyCODONE  40 mg Oral Q8H  . predniSONE  5 mg Oral BID WC  . sodium chloride flush  3 mL Intravenous Q12H  . sodium chloride flush  3 mL Intravenous Q12H  . ticagrelor  90 mg Oral BID   Continuous Infusions: . sodium chloride 20 mL/hr (08/16/15 1924)   PRN Meds:.sodium chloride, acetaminophen **OR** acetaminophen, cyclobenzaprine, morphine injection, ondansetron **OR** ondansetron (ZOFRAN) IV, oxyCODONE, sodium chloride flush   Data Review:   Micro  Results Recent Results (from the past 240 hour(s))  MRSA PCR Screening     Status: None   Collection Time: 08/16/15  6:33 PM  Result Value Ref Range Status   MRSA by PCR NEGATIVE NEGATIVE Final    Comment:        The GeneXpert MRSA Assay (FDA approved for NASAL specimens only), is one component of a comprehensive MRSA colonization surveillance program. It is not intended to diagnose MRSA infection nor to guide or monitor treatment for MRSA infections.     Radiology Reports Dg Chest Port 1 View  Result Date: 08/16/2015 CLINICAL DATA:  Chest pain EXAM: PORTABLE CHEST 1 VIEW COMPARISON:  02/11/2015 FINDINGS: Cardiac shadow is within normal limits. The lungs are clear bilaterally. No sizable effusion is seen. Bilateral nipple shadows are seen. Additionally some suggestion of focal nodules are noted bilaterally. Repeat CT may be helpful for further evaluation on a nonemergent basis. No other focal abnormality is seen. No significant mediastinal widening is noted. IMPRESSION: Questionable nodules bilaterally. Noncontrast CT is recommended when able. Electronically Signed   By: Inez Catalina M.D.   On: 08/16/2015 16:42     CBC  Recent Labs Lab 08/16/15 1626 08/17/15 0416  WBC 5.6 5.0  HGB 13.3 13.2  HCT 38.5* 38.1*  PLT 146* 141*  MCV 93.9 93.7  MCH 32.5 32.5  MCHC 34.6 34.7  RDW 14.6* 14.6*  LYMPHSABS 0.6*  --   MONOABS 0.6  --   EOSABS 0.0  --   BASOSABS 0.0  --     Chemistries   Recent Labs Lab 08/16/15 1626 08/17/15 0416  NA 139 142  K 3.6 3.7  CL 105 110  CO2 24 26  GLUCOSE 171* 77  BUN 12 10  CREATININE 0.82 0.69  CALCIUM 9.7 9.2  AST 19  --   ALT 15*  --   ALKPHOS 35*  --  BILITOT 0.6  --    ------------------------------------------------------------------------------------------------------------------ estimated creatinine clearance is 99.4 mL/min (by C-G formula based on SCr of 0.8  mg/dL). ------------------------------------------------------------------------------------------------------------------ No results for input(s): HGBA1C in the last 72 hours. ------------------------------------------------------------------------------------------------------------------  Recent Labs  08/16/15 1626  CHOL 226*  HDL 45  LDLCALC 141*  TRIG 199*  CHOLHDL 5.0   ------------------------------------------------------------------------------------------------------------------ No results for input(s): TSH, T4TOTAL, T3FREE, THYROIDAB in the last 72 hours.  Invalid input(s): FREET3 ------------------------------------------------------------------------------------------------------------------ No results for input(s): VITAMINB12, FOLATE, FERRITIN, TIBC, IRON, RETICCTPCT in the last 72 hours.  Coagulation profile  Recent Labs Lab 08/16/15 1626  INR 0.94    No results for input(s): DDIMER in the last 72 hours.  Cardiac Enzymes  Recent Labs Lab 08/16/15 1626  TROPONINI 0.03*   ------------------------------------------------------------------------------------------------------------------ Invalid input(s): POCBNP    Assessment & Plan   Patient is 65 year old with history of metastatic prostate cancer presenting with no ST EMI 1. ST MI status post stent to the RCA Continue aspirin, statin brlinata Discharge per cardiology 2. Prostate cancer with meds  3. Hypertension essential Continue losartan  4. Hyperlipidemia continue high-dose atorvastatin       Code Status Orders        Start     Ordered   08/16/15 1831  Full code  Continuous     08/16/15 1830   08/16/15 1831  Full code  Continuous     08/16/15 1830    Code Status History    Date Active Date Inactive Code Status Order ID Comments User Context   08/16/2015  6:30 PM 08/16/2015  6:30 PM Full Code [id number]  Lytle Butte, MD Inpatient   08/16/2015  6:30 PM 08/17/2015 12:42 PM Full Code  [id number]  Yolonda Kida, MD Inpatient           Consults  Cardiology DVT Prophylaxis heparin  Lab Results  Component Value Date   PLT 141 (L) 08/17/2015     Time Spent in minutes  23min  Greater than 50% of time spent in care coordination and counseling patient regarding the condition and plan of care.   Dustin Flock M.D on 08/17/2015 at 2:14 PM  Between 7am to 6pm - Pager - [phone]  After 6pm go to [email or web address] - password [email or web address] [facility]   Office  [phone]

## 2015-08-17 NOTE — Progress Notes (Signed)
Spoke with oncologist regarding pt Zytiga 1000 mg.Oncologist said to continue to hold at this point.  Will continue to assess.

## 2015-08-17 NOTE — Progress Notes (Signed)
Subjective:  Pain-free feels really well now Back pain much improved patient states he has trouble with statins  No significant shortness of breath no groin issues  Objective:  Vital Signs in the last 24 hours: Temp:  [98.1 F (36.7 C)-98.8 F (37.1 C)] 98.6 F (37 C) (08/15 1100) Pulse Rate:  [52-81] 59 (08/15 0800) Resp:  [10-26] 21 (08/15 0800) BP: (100-144)/(55-100) 100/57 (08/15 0800) SpO2:  [98 %-100 %] 98 % (08/15 1100) Weight:  [90 kg (198 lb 6.6 oz)] 90 kg (198 lb 6.6 oz) (08/14 1834)  Intake/Output from previous day: 08/14 0701 - 08/15 0700 In: 187.7 [I.V.:187.7] Out: 350 [Urine:350] Intake/Output from this shift: Total I/O In: -  Out: 500 [Urine:500]  Physical Exam: General appearance: cooperative and appears stated age Neck: no adenopathy, no carotid bruit, no JVD, supple, symmetrical, trachea midline and thyroid not enlarged, symmetric, no tenderness/mass/nodules Lungs: clear to auscultation bilaterally Heart: regular rate and rhythm, S1, S2 normal, no murmur, click, rub or gallop Abdomen: soft, non-tender; bowel sounds normal; no masses,  no organomegaly Extremities: extremities normal, atraumatic, no cyanosis or edema Pulses: 2+ and symmetric Skin: Skin color, texture, turgor normal. No rashes or lesions Neurologic: Alert and oriented X 3, normal strength and tone. Normal symmetric reflexes. Normal coordination and gait  Lab Results:  Recent Labs  08/16/15 1626 08/17/15 0416  WBC 5.6 5.0  HGB 13.3 13.2  PLT 146* 141*    Recent Labs  08/16/15 1626 08/17/15 0416  NA 139 142  K 3.6 3.7  CL 105 110  CO2 24 26  GLUCOSE 171* 77  BUN 12 10  CREATININE 0.82 0.69    Recent Labs  08/16/15 1626  TROPONINI 0.03*   Hepatic Function Panel  Recent Labs  08/16/15 1626  PROT 6.7  ALBUMIN 4.3  AST 19  ALT 15*  ALKPHOS 35*  BILITOT 0.6    Recent Labs  08/16/15 1626  CHOL 226*   No results for input(s): PROTIME in the last 72  hours.  Imaging: Imaging results have been reviewed  Cardiac Studies:  Assessment/Plan:  STEMI postop day 1 Angina improved Coronary artery disease Chronic pain improved Hypertension Shortness of breath ASCVD  Prostate cancer Depression . Plan Continue aspirin Brilinta for at least 1 year Transfer to telemetry Increase activity with ambulation Low-dose beta blocker ACE inhibitor Lipitor for lipid management 80 mg once a day Borderline troponins recommend follow-up enzymes Follow-up with oncology for prostate cancer  LOS: 1 day    Robert Short D. 08/17/2015, 4:46 PM

## 2015-08-18 ENCOUNTER — Other Ambulatory Visit: Payer: Self-pay | Admitting: *Deleted

## 2015-08-18 ENCOUNTER — Inpatient Hospital Stay: Payer: 59

## 2015-08-18 DIAGNOSIS — I213 ST elevation (STEMI) myocardial infarction of unspecified site: Secondary | ICD-10-CM

## 2015-08-18 DIAGNOSIS — F17210 Nicotine dependence, cigarettes, uncomplicated: Secondary | ICD-10-CM

## 2015-08-18 DIAGNOSIS — R0602 Shortness of breath: Secondary | ICD-10-CM

## 2015-08-18 DIAGNOSIS — I10 Essential (primary) hypertension: Secondary | ICD-10-CM

## 2015-08-18 DIAGNOSIS — I2510 Atherosclerotic heart disease of native coronary artery without angina pectoris: Secondary | ICD-10-CM

## 2015-08-18 DIAGNOSIS — C61 Malignant neoplasm of prostate: Secondary | ICD-10-CM

## 2015-08-18 LAB — CBC WITH DIFFERENTIAL/PLATELET
BASOS ABS: 0 10*3/uL (ref 0–0.1)
BASOS PCT: 1 %
Eosinophils Absolute: 0.1 10*3/uL (ref 0–0.7)
Eosinophils Relative: 1 %
HEMATOCRIT: 38.8 % — AB (ref 40.0–52.0)
HEMOGLOBIN: 13.2 g/dL (ref 13.0–18.0)
LYMPHS PCT: 15 %
Lymphs Abs: 0.9 10*3/uL — ABNORMAL LOW (ref 1.0–3.6)
MCH: 32.3 pg (ref 26.0–34.0)
MCHC: 34 g/dL (ref 32.0–36.0)
MCV: 94.9 fL (ref 80.0–100.0)
MONO ABS: 0.8 10*3/uL (ref 0.2–1.0)
Monocytes Relative: 14 %
NEUTROS ABS: 4.1 10*3/uL (ref 1.4–6.5)
NEUTROS PCT: 69 %
Platelets: 143 10*3/uL — ABNORMAL LOW (ref 150–440)
RBC: 4.08 MIL/uL — AB (ref 4.40–5.90)
RDW: 14.7 % — AB (ref 11.5–14.5)
WBC: 5.9 10*3/uL (ref 3.8–10.6)

## 2015-08-18 LAB — ECHOCARDIOGRAM COMPLETE
Height: 71 in
WEIGHTICAEL: 3174.62 [oz_av]

## 2015-08-18 MED ORDER — TICAGRELOR 90 MG PO TABS: 90.0000 mg | ORAL_TABLET | Freq: Two times a day (BID) | ORAL | 12 refills | Status: DC

## 2015-08-18 MED ORDER — LOSARTAN POTASSIUM 100 MG PO TABS: 100.0000 mg | ORAL_TABLET | Freq: Every day | ORAL | 12 refills | Status: DC

## 2015-08-18 MED ORDER — ATORVASTATIN CALCIUM 80 MG PO TABS: 80.0000 mg | ORAL_TABLET | Freq: Every day | ORAL | 12 refills | Status: DC

## 2015-08-18 NOTE — Progress Notes (Signed)
Patient was transferred from the ICU to the unit via wheelchair. Patient was ambulatory and accompanied by wife. Cath site looks clean dry and intact.Patient ambulated on the unit without any complain.

## 2015-08-18 NOTE — Progress Notes (Signed)
[place] at [facility]                                                                                                                                                                                            Patient Demographics   Robert Short, is a 65 y.o. male, DOB - 02/01/1950, [id number]  Admit date - 08/16/2015   Admitting Physician Yolonda Kida, MD  Outpatient Primary MD for the patient is Robert Doe, MD   LOS - 2  Subjective:Patient feeling better denies any chest pain or shortness of breath   Review of Systems:   CONSTITUTIONAL: No documented fever. No fatigue, weakness. No weight gain, no weight loss.  EYES: No blurry or double vision.  ENT: No tinnitus. No postnasal drip. No redness of the oropharynx.  RESPIRATORY: No cough, no wheeze, no hemoptysis. No dyspnea.  CARDIOVASCULAR: No chest pain. No orthopnea. No palpitations. No syncope.  GASTROINTESTINAL: No nausea, no vomiting or diarrhea. No abdominal pain. No melena or hematochezia.  GENITOURINARY: No dysuria or hematuria.  ENDOCRINE: No polyuria or nocturia. No heat or cold intolerance.  HEMATOLOGY: No anemia. No bruising. No bleeding.  INTEGUMENTARY: No rashes. No lesions.  MUSCULOSKELETAL: No arthritis. No swelling. No gout.  NEUROLOGIC: No numbness, tingling, or ataxia. No seizure-type activity.  PSYCHIATRIC: No anxiety. No insomnia. No ADD.    Vitals:   Vitals:   08/17/15 1600 08/17/15 1700 08/17/15 1930 08/18/15 0437  BP:    129/88  Pulse:    71  Resp: 15 (!) 30  18  Temp:   98.4 F (36.9 C) 97.8 F (36.6 C)  TempSrc:   Oral Oral  SpO2:    98%  Weight:      Height:        Wt Readings from Last 3 Encounters:  08/16/15 90 kg (198 lb 6.6 oz)  07/21/15 90.4 kg (199 lb 3 oz)  07/20/15 91.3 kg (201 lb 4.5 oz)     Intake/Output Summary (Last 24 hours) at 08/18/15 0854 Last data filed at 08/18/15 0833  Gross per 24 hour  Intake              100 ml   Output             1850 ml  Net            -1750 ml    Physical Exam:   GENERAL: Pleasant-appearing in no apparent distress.  HEAD, EYES, EARS, NOSE AND THROAT: Atraumatic, normocephalic. Extraocular muscles are intact. Pupils equal and reactive to light. Sclerae anicteric. No conjunctival injection. No  oro-pharyngeal erythema.  NECK: Supple. There is no jugular venous distention. No bruits, no lymphadenopathy, no thyromegaly.  HEART: Regular rate and rhythm,. No murmurs, no rubs, no clicks.  LUNGS: Clear to auscultation bilaterally. No rales or rhonchi. No wheezes.  ABDOMEN: Soft, flat, nontender, nondistended. Has good bowel sounds. No hepatosplenomegaly appreciated.  EXTREMITIES: No evidence of any cyanosis, clubbing, or peripheral edema.  +2 pedal and radial pulses bilaterally.  NEUROLOGIC: The patient is alert, awake, and oriented x3 with no focal motor or sensory deficits appreciated bilaterally.  SKIN: Moist and warm with no rashes appreciated.  Psych: Not anxious, depressed LN: No inguinal LN enlargement    Antibiotics   Anti-infectives    None      Medications   Scheduled Meds: . abiraterone Acetate  1,000 mg Oral Daily  . aspirin EC  81 mg Oral Daily  . atorvastatin  80 mg Oral q1800  . baclofen  20 mg Oral BID  . losartan  100 mg Oral Daily  . oxyCODONE  40 mg Oral Q8H  . predniSONE  5 mg Oral BID WC  . sodium chloride flush  3 mL Intravenous Q12H  . sodium chloride flush  3 mL Intravenous Q12H  . ticagrelor  90 mg Oral BID   Continuous Infusions: . sodium chloride 20 mL/hr (08/16/15 1924)   PRN Meds:.sodium chloride, acetaminophen **OR** acetaminophen, cyclobenzaprine, morphine injection, ondansetron **OR** ondansetron (ZOFRAN) IV, oxyCODONE, sodium chloride flush   Data Review:   Micro Results Recent Results (from the past 240 hour(s))  MRSA PCR Screening     Status: None   Collection Time: 08/16/15  6:33 PM  Result Value Ref Range Status   MRSA by  PCR NEGATIVE NEGATIVE Final    Comment:        The GeneXpert MRSA Assay (FDA approved for NASAL specimens only), is one component of a comprehensive MRSA colonization surveillance program. It is not intended to diagnose MRSA infection nor to guide or monitor treatment for MRSA infections.     Radiology Reports Dg Chest Port 1 View  Result Date: 08/16/2015 CLINICAL DATA:  Chest pain EXAM: PORTABLE CHEST 1 VIEW COMPARISON:  02/11/2015 FINDINGS: Cardiac shadow is within normal limits. The lungs are clear bilaterally. No sizable effusion is seen. Bilateral nipple shadows are seen. Additionally some suggestion of focal nodules are noted bilaterally. Repeat CT may be helpful for further evaluation on a nonemergent basis. No other focal abnormality is seen. No significant mediastinal widening is noted. IMPRESSION: Questionable nodules bilaterally. Noncontrast CT is recommended when able. Electronically Signed   By: Inez Catalina M.D.   On: 08/16/2015 16:42     CBC  Recent Labs Lab 08/16/15 1626 08/17/15 0416  WBC 5.6 5.0  HGB 13.3 13.2  HCT 38.5* 38.1*  PLT 146* 141*  MCV 93.9 93.7  MCH 32.5 32.5  MCHC 34.6 34.7  RDW 14.6* 14.6*  LYMPHSABS 0.6*  --   MONOABS 0.6  --   EOSABS 0.0  --   BASOSABS 0.0  --     Chemistries   Recent Labs Lab 08/16/15 1626 08/17/15 0416  NA 139 142  K 3.6 3.7  CL 105 110  CO2 24 26  GLUCOSE 171* 77  BUN 12 10  CREATININE 0.82 0.69  CALCIUM 9.7 9.2  AST 19  --   ALT 15*  --   ALKPHOS 35*  --   BILITOT 0.6  --    ------------------------------------------------------------------------------------------------------------------ estimated creatinine clearance is 99.4 mL/min (by C-G  formula based on SCr of 0.8 mg/dL). ------------------------------------------------------------------------------------------------------------------ No results for input(s): HGBA1C in the last 72  hours. ------------------------------------------------------------------------------------------------------------------  Recent Labs  08/16/15 1626  CHOL 226*  HDL 45  LDLCALC 141*  TRIG 199*  CHOLHDL 5.0   ------------------------------------------------------------------------------------------------------------------ No results for input(s): TSH, T4TOTAL, T3FREE, THYROIDAB in the last 72 hours.  Invalid input(s): FREET3 ------------------------------------------------------------------------------------------------------------------ No results for input(s): VITAMINB12, FOLATE, FERRITIN, TIBC, IRON, RETICCTPCT in the last 72 hours.  Coagulation profile  Recent Labs Lab 08/16/15 1626  INR 0.94    No results for input(s): DDIMER in the last 72 hours.  Cardiac Enzymes  Recent Labs Lab 08/16/15 1626 08/17/15 2052  TROPONINI 0.03* 0.30*   ------------------------------------------------------------------------------------------------------------------ Invalid input(s): POCBNP    Assessment & Plan   Patient is 65 year old with history of metastatic prostate cancer presenting with no ST EMI 1. ST MI status post stent to the RCA Continue aspirin, statin And brlinata Okay to discharge from a medical standpoint 2. Prostate cancer with mets  3. Hypertension essential Continue losartan Blood pressure stable 4. Hyperlipidemia continue high-dose atorvastatin   Please call with any questions we'll sign off      Code Status Orders        Start     Ordered   08/16/15 1831  Full code  Continuous     08/16/15 1830   08/16/15 1831  Full code  Continuous     08/16/15 1830    Code Status History    Date Active Date Inactive Code Status Order ID Comments User Context   08/16/2015  6:30 PM 08/16/2015  6:30 PM Full Code [id number]  Lytle Butte, MD Inpatient   08/16/2015  6:30 PM 08/17/2015 12:42 PM Full Code [id number]  Yolonda Kida, MD Inpatient            Consults  Cardiology DVT Prophylaxis heparin  Lab Results  Component Value Date   PLT 141 (L) 08/17/2015     Time Spent in minutes  39min  Greater than 50% of time spent in care coordination and counseling patient regarding the condition and plan of care.   Dustin Flock M.D on 08/18/2015 at 8:54 AM  Between 7am to 6pm - Pager - [phone]  After 6pm go to [email or web address] - password [email or web address] [facility]   Office  [phone]

## 2015-08-18 NOTE — Consult Note (Signed)
[facility] NOTE  Patient Care Team: Arlis Porta., MD as PCP - General (Family Medicine)  CHIEF COMPLAINTS/PURPOSE OF CONSULTATION:  Metastatic prostate cancer.   HISTORY OF PRESENTING ILLNESS:  Robert Short 65 y.o.  male history of castrate resistant prostate cancer-follows up with me in the [facility]- recently started on Zytiga. Patient started to have worsening back pain neck pain approximately 5 days after taking Zytiga. Patient has chronic neck pain and back pain.   Patient was promptly evaluated- workup suggestive of STEMI; underwent cardiac catheterization- had a stent placed in RCA. Is currently on antiplatelet therapy. Patient feeling significantly improved since his stent placement.  Anticipating discharge this evening.  ROS: A complete 10 point review of system is done which is negative except mentioned above in history of present illness  MEDICAL HISTORY:  Past Medical History:  Diagnosis Date  . ASCVD (arteriosclerotic cardiovascular disease)   . Back pain   . Back pain 10/09/2012  . Bone cancer ([place])   . Depression   . History of kidney stones   . Hypertension   . Joint pain   . Prostate cancer ([place])   . SOB (shortness of breath) 10/09/2012  . Unable to ambulate 10/09/2012    SURGICAL HISTORY: Past Surgical History:  Procedure Laterality Date  . CARDIAC CATHETERIZATION     [facility]  . CARDIAC CATHETERIZATION N/A 08/16/2015   Procedure: Left Heart Cath and Coronary Angiography;  Surgeon: Yolonda Kida, MD;  Location: [facility];  Service: Cardiovascular;  Laterality: N/A;  . CARDIAC CATHETERIZATION N/A 08/16/2015   Procedure: Coronary Stent Intervention;  Surgeon: Yolonda Kida, MD;  Location: [facility];  Service: Cardiovascular;  Laterality: N/A;  . CERVIX SURGERY    . PROSTATECTOMY    . SPINE SURGERY    . TONSILLECTOMY    . WRIST SURGERY      SOCIAL HISTORY: Social History   Social History  .  Marital status: Married    Spouse name: N/A  . Number of children: N/A  . Years of education: N/A   Occupational History  . Not on file.   Social History Main Topics  . Smoking status: Heavy Tobacco Smoker    Packs/day: 1.00    Years: 40.00    Types: Cigarettes  . Smokeless tobacco: Never Used     Comment: "I use a pack a day in 24 hours. I have quit and started in past."  . Alcohol use No  . Drug use: No  . Sexual activity: Not on file   Other Topics Concern  . Not on file   Social History Narrative  . No narrative on file    FAMILY HISTORY: Family History  Problem Relation Age of Onset  . Heart attack Mother   . Hypertension Mother   . Heart attack Father     ALLERGIES:  has No Known Allergies.  MEDICATIONS:  Current Facility-Administered Medications  Medication Dose Route Frequency Provider Last Rate Last Dose  . 0.9 %  sodium chloride infusion  10-20 mL/hr Intravenous Continuous Merlyn Lot, MD 20 mL/hr at 08/16/15 1924 20 mL/hr at 08/16/15 1924  . 0.9 %  sodium chloride infusion  250 mL Intravenous PRN Dwayne D Callwood, MD      . abiraterone Acetate (ZYTIGA) tablet 1,000 mg  1,000 mg Oral Daily Lytle Butte, MD      . acetaminophen (TYLENOL) tablet 650 mg  650 mg Oral Q6H PRN  Lytle Butte, MD   650 mg at 08/17/15 2253   Or  . acetaminophen (TYLENOL) suppository 650 mg  650 mg Rectal Q6H PRN Lytle Butte, MD      . aspirin EC tablet 81 mg  81 mg Oral Daily Lytle Butte, MD   81 mg at 08/18/15 [id number]  . atorvastatin (LIPITOR) tablet 80 mg  80 mg Oral q1800 Dwayne D Callwood, MD   80 mg at 08/18/15 1703  . baclofen (LIORESAL) tablet 20 mg  20 mg Oral BID Lytle Butte, MD   20 mg at 08/18/15 [id number]  . cyclobenzaprine (FLEXERIL) tablet 5 mg  5 mg Oral TID PRN Lytle Butte, MD   5 mg at 08/17/15 2253  . losartan (COZAAR) tablet 100 mg  100 mg Oral Daily Lytle Butte, MD   100 mg at 08/18/15 [id number]  . morphine 2 MG/ML injection 2 mg  2 mg Intravenous Q4H PRN  Lytle Butte, MD   2 mg at 08/17/15 0936  . ondansetron (ZOFRAN) tablet 4 mg  4 mg Oral Q6H PRN Lytle Butte, MD       Or  . ondansetron [facility]) injection 4 mg  4 mg Intravenous Q6H PRN Lytle Butte, MD      . oxyCODONE (Oxy IR/ROXICODONE) immediate release tablet 10 mg  10 mg Oral Q6H PRN Lytle Butte, MD   10 mg at 08/18/15 1703  . oxyCODONE (OXYCONTIN) 12 hr tablet 40 mg  40 mg Oral Q8H Lytle Butte, MD   40 mg at 08/18/15 1350  . predniSONE (DELTASONE) tablet 5 mg  5 mg Oral BID WC Lytle Butte, MD   5 mg at 08/18/15 1703  . sodium chloride flush (NS) 0.9 % injection 3 mL  3 mL Intravenous Q12H Lytle Butte, MD   3 mL at 08/18/15 [id number]  . sodium chloride flush (NS) 0.9 % injection 3 mL  3 mL Intravenous Q12H Dwayne D Callwood, MD   3 mL at 08/18/15 0922  . sodium chloride flush (NS) 0.9 % injection 3 mL  3 mL Intravenous PRN Dwayne D Callwood, MD      . ticagrelor (BRILINTA) tablet 90 mg  90 mg Oral BID Yolonda Kida, MD   90 mg at 08/18/15 0921      .  PHYSICAL EXAMINATION:  Vitals:   08/18/15 0437 08/18/15 1129  BP: 129/88 (!) 153/85  Pulse: 71 76  Resp: 18 17  Temp: 97.8 F (36.6 C)    Filed Weights   08/16/15 1626 08/16/15 1834  Weight: 199 lb (90.3 kg) 198 lb 6.6 oz (90 kg)    GENERAL: Well-nourished well-developed; Alert, no distress and comfortable.   Accompanied by his wife. EYES: no pallor or icterus OROPHARYNX: no thrush or ulceration. NECK: supple, no masses felt LYMPH:  no palpable lymphadenopathy in the cervical, axillary or inguinal regions LUNGS: decreased breath sounds to auscultation at bases and  No wheeze or crackles HEART/CVS: regular rate & rhythm and no murmurs; No lower extremity edema ABDOMEN: abdomen soft, non-tender and normal bowel sounds Musculoskeletal:no cyanosis of digits and no clubbing  PSYCH: alert & oriented x 3 with fluent speech NEURO: no focal motor/sensory deficits SKIN:  no rashes or significant lesions  LABORATORY  DATA:  I have reviewed the data as listed Lab Results  Component Value Date   WBC 5.9 08/18/2015   HGB 13.2 08/18/2015   HCT 38.8 (L) 08/18/2015  MCV 94.9 08/18/2015   PLT 143 (L) 08/18/2015    Recent Labs  07/14/15 1325 08/10/15 1403 08/16/15 1626 08/17/15 0416  NA 137 137 139 142  K 4.0 3.9 3.6 3.7  CL 107 103 105 110  CO2 25 28 24 26   GLUCOSE 139* 166* 171* 77  BUN 11 11 12 10   CREATININE 0.65 0.88 0.82 0.69  CALCIUM 9.1 9.6 9.7 9.2  GFRNONAA >60 >60 >60 >60  GFRAA >60 >60 >60 >60  PROT 6.5 6.6 6.7  --   ALBUMIN 3.8 4.1 4.3  --   AST 21 17 19   --   ALT 22 18 15*  --   ALKPHOS 38 32* 35*  --   BILITOT 0.5 0.4 0.6  --     RADIOGRAPHIC STUDIES: I have personally reviewed the radiological images as listed and agreed with the findings in the report. Dg Chest Port 1 View  Result Date: 08/16/2015 CLINICAL DATA:  Chest pain EXAM: PORTABLE CHEST 1 VIEW COMPARISON:  02/11/2015 FINDINGS: Cardiac shadow is within normal limits. The lungs are clear bilaterally. No sizable effusion is seen. Bilateral nipple shadows are seen. Additionally some suggestion of focal nodules are noted bilaterally. Repeat CT may be helpful for further evaluation on a nonemergent basis. No other focal abnormality is seen. No significant mediastinal widening is noted. IMPRESSION: Questionable nodules bilaterally. Noncontrast CT is recommended when able. Electronically Signed   By: Inez Catalina M.D.   On: 08/16/2015 16:42    ASSESSMENT & PLAN:   # 65 year old male patient with history of castrate resistant prostate cancer- currently admitted to the hospital for chest pain/CAD-ST EMI.  # Prostate cancer castrate resistant- cardiac event is very unlikely secondary to a Zytiga. However hold Zytiga given the acute events. Recommend starting it in approximately 2 weeks /follow-up visit.   with regards to Xofigo/radium 223- as planned with radiation oncology next week. Check a CBC with differential now. Defer to  the radiation oncology for treatment as planned for next week.  # CAD/ST EMI status post RCA stenting- and dramatically improved. Continue antiplatelet therapy/no contraindication from oncology standpoint.  # The above plan of care was discussed with the patient and his wife in detail. Recommend follow-up in approximately 2 weeks in [place] office.   Thank you for allowing me to participate in the care of your pleasant patient. Please do not hesitate to contact me with questions or concerns in the interim.   All questions were answered. The patient knows to call the clinic with any problems, questions or concerns.     [name], MD 08/18/2015 5:52 PM

## 2015-08-18 NOTE — Discharge Instructions (Signed)
Please take asa and brillinta for at least one year.

## 2015-08-19 DIAGNOSIS — Z510 Encounter for antineoplastic radiation therapy: Secondary | ICD-10-CM | POA: Diagnosis not present

## 2015-08-19 DIAGNOSIS — C61 Malignant neoplasm of prostate: Secondary | ICD-10-CM | POA: Diagnosis not present

## 2015-08-24 ENCOUNTER — Telehealth: Payer: Self-pay | Admitting: Internal Medicine

## 2015-08-24 ENCOUNTER — Other Ambulatory Visit: Payer: Self-pay | Admitting: *Deleted

## 2015-08-24 ENCOUNTER — Encounter: Payer: Self-pay | Admitting: *Deleted

## 2015-08-24 DIAGNOSIS — G8929 Other chronic pain: Secondary | ICD-10-CM

## 2015-08-24 DIAGNOSIS — C61 Malignant neoplasm of prostate: Secondary | ICD-10-CM

## 2015-08-24 DIAGNOSIS — C7951 Secondary malignant neoplasm of bone: Secondary | ICD-10-CM

## 2015-08-24 DIAGNOSIS — G893 Neoplasm related pain (acute) (chronic): Secondary | ICD-10-CM

## 2015-08-24 MED ORDER — OXYCODONE HCL 10 MG PO TABS: ORAL_TABLET | ORAL | 0 refills | Status: DC

## 2015-08-24 MED ORDER — OXYCODONE HCL ER 40 MG PO T12A: 40.0000 mg | EXTENDED_RELEASE_TABLET | Freq: Two times a day (BID) | ORAL | 0 refills | Status: DC

## 2015-08-24 NOTE — Progress Notes (Signed)
Medication refill-  Request.  Pt stopped by cancer center requesting refill on narcotics. OxyContin and oxycodone.  This RF was approved by Dr. Mirian Capuchin provided with RX.

## 2015-08-24 NOTE — Telephone Encounter (Signed)
Pt and wife came in to request refill on oxycodone and oxycontin. They are here now waiting for Rx.

## 2015-08-25 ENCOUNTER — Ambulatory Visit
Admission: RE | Admit: 2015-08-25 | Discharge: 2015-08-25 | Disposition: A | Discharge status: Home / Self Care | Payer: 59 | Source: Ambulatory Visit | Attending: Radiation Oncology | Admitting: Radiation Oncology

## 2015-08-25 ENCOUNTER — Encounter: Payer: Self-pay | Admitting: Radiation Oncology

## 2015-08-25 VITALS — BP 127/80 | HR 69 | Temp 96.9°F | Resp 20 | Wt 198.9 lb

## 2015-08-25 DIAGNOSIS — C7951 Secondary malignant neoplasm of bone: Secondary | ICD-10-CM | POA: Diagnosis not present

## 2015-08-25 DIAGNOSIS — C61 Malignant neoplasm of prostate: Secondary | ICD-10-CM | POA: Diagnosis not present

## 2015-08-25 DIAGNOSIS — Z510 Encounter for antineoplastic radiation therapy: Secondary | ICD-10-CM | POA: Diagnosis not present

## 2015-08-25 NOTE — Progress Notes (Signed)
Radiation Oncology Follow up Note  Name: Robert Short   Date:   08/25/2015 MRN:  [id number] DOB: 03/17/1950    This 65 y.o. male presents to the clinic today for fifth Xofigoinjection.  REFERRING PROVIDER: Arlis Porta., MD  HPI: Patient is a 65 year old male with stage IV prostate cancer originally status post prostatectomy in 2011 for Gleason 7 (3+4) adenocarcinoma of the prostate involving the bladder neck with biochemical recurrence. In July 2014. He was started on case adduction Lupron declined chemotherapy has been started on X Geva every 3 months. I recently completed palliative radiation therapy to his SI joints as well as a single fraction of 800 cGy to his right proximal femur and acetabulum. He is doing poor. He continues to have significant pain his bone scan shows widespread axial metastasis. He is again discussed with any chemotherapy options and has declined chemotherapy. He is seen today for his fifth Xofigo injection.  Peripheral line was started on the patient and 20 cc of normal saline were passed to make sure there was patency of the lines. Trudi Ida was administered over a 10 minute infusion push by radiation oncologist. After completion of IV push of Xofigo 30 cc an additional saline were passed through the peripheral line. Oral lines syringes drapes and original container of Xofigo were then taken to nuclear medicine for storage. Patient tolerated the procedure well without side effect or complaint. Patient has anti-emetic medication. Have scheduled the patient for a three-week followup to check on his counts. Patient is to call sooner with any side effects or complaints.  COMPLICATIONS OF TREATMENT: none  FOLLOW UP COMPLIANCE: keeps appointments   PHYSICAL EXAM:  BP 127/80   Pulse 69   Temp (!) 96.9 F (36.1 C)   Resp 20   Wt 198 lb 13.7 oz (90.2 kg)   BMI 27.73 kg/m  Well-developed well-nourished patient in NAD. HEENT reveals PERLA, EOMI, discs not visualized.   Oral cavity is clear. No oral mucosal lesions are identified. Neck is clear without evidence of cervical or supraclavicular adenopathy. Lungs are clear to A&P. Cardiac examination is essentially unremarkable with regular rate and rhythm without murmur rub or thrill. Abdomen is benign with no organomegaly or masses noted. Motor sensory and DTR levels are equal and symmetric in the upper and lower extremities. Cranial nerves II through XII are grossly intact. Proprioception is intact. No peripheral adenopathy or edema is identified. No motor or sensory levels are noted. Crude visual fields are within normal range.  RADIOLOGY RESULTS: No current films for review  PLAN: Patient continues to do well. He'll see him back in 1 month for blood work and his sixth and final macro X injection. Patient knows to call with any concerns.  I would like to take this opportunity to thank you for allowing me to participate in the care of your patient.Armstead Peaks., MD

## 2015-08-30 ENCOUNTER — Encounter: Payer: Self-pay | Admitting: Family Medicine

## 2015-08-30 ENCOUNTER — Ambulatory Visit (INDEPENDENT_AMBULATORY_CARE_PROVIDER_SITE_OTHER): Payer: 59 | Admitting: Family Medicine

## 2015-08-30 VITALS — BP 110/70 | HR 76 | Temp 99.0°F | Resp 16 | Ht 71.0 in | Wt 195.0 lb

## 2015-08-30 DIAGNOSIS — E785 Hyperlipidemia, unspecified: Secondary | ICD-10-CM

## 2015-08-30 DIAGNOSIS — I10 Essential (primary) hypertension: Secondary | ICD-10-CM | POA: Diagnosis not present

## 2015-08-30 DIAGNOSIS — C61 Malignant neoplasm of prostate: Secondary | ICD-10-CM | POA: Diagnosis not present

## 2015-08-30 DIAGNOSIS — M4802 Spinal stenosis, cervical region: Secondary | ICD-10-CM | POA: Diagnosis not present

## 2015-08-30 DIAGNOSIS — I2111 ST elevation (STEMI) myocardial infarction involving right coronary artery: Secondary | ICD-10-CM | POA: Diagnosis not present

## 2015-08-30 DIAGNOSIS — G893 Neoplasm related pain (acute) (chronic): Secondary | ICD-10-CM

## 2015-08-30 MED ORDER — LOSARTAN POTASSIUM 50 MG PO TABS: ORAL_TABLET | ORAL | 12 refills | Status: DC

## 2015-08-30 NOTE — Progress Notes (Signed)
Name: Robert Short   MRN: [id number]    DOB: 02-23-1950   Date:08/30/2015       Progress Note  Subjective  Chief Complaint  Chief Complaint  Patient presents with  . Hypertension  . Hyperglycemia    HPI Here for f/u of HBP.  Has metastatic prostate cancer and being treated by oncology.  He had recent MI (8.14.17).  Had angioplasty and Stent done.  No chest pain now.  Still has bone pain from prostate metastasis.  No problem-specific Assessment & Plan notes found for this encounter.   Past Medical History:  Diagnosis Date  . ASCVD (arteriosclerotic cardiovascular disease)   . Back pain   . Back pain 10/09/2012  . Bone cancer ([place])   . Depression   . History of kidney stones   . Hypertension   . Joint pain   . Prostate cancer ([place])   . SOB (shortness of breath) 10/09/2012  . Unable to ambulate 10/09/2012    Past Surgical History:  Procedure Laterality Date  . CARDIAC CATHETERIZATION     [facility]  . CARDIAC CATHETERIZATION N/A 08/16/2015   Procedure: Left Heart Cath and Coronary Angiography;  Surgeon: Yolonda Kida, MD;  Location: [facility];  Service: Cardiovascular;  Laterality: N/A;  . CARDIAC CATHETERIZATION N/A 08/16/2015   Procedure: Coronary Stent Intervention;  Surgeon: Yolonda Kida, MD;  Location: [facility];  Service: Cardiovascular;  Laterality: N/A;  . CERVIX SURGERY    . PROSTATECTOMY    . SPINE SURGERY    . TONSILLECTOMY    . WRIST SURGERY      Family History  Problem Relation Age of Onset  . Heart attack Mother   . Hypertension Mother   . Heart attack Father     Social History   Social History  . Marital status: Married    Spouse name: N/A  . Number of children: N/A  . Years of education: N/A   Occupational History  . Not on file.   Social History Main Topics  . Smoking status: Heavy Tobacco Smoker    Packs/day: 1.00    Years: 40.00    Types: Cigarettes  . Smokeless tobacco: Never Used     Comment: "I use a pack a  day in 24 hours. I have quit and started in past."  . Alcohol use No  . Drug use: No  . Sexual activity: Not on file   Other Topics Concern  . Not on file   Social History Narrative  . No narrative on file     Current Outpatient Prescriptions:  .  acetaminophen (TYLENOL) 500 MG tablet, Take 1,500 mg by mouth every 8 (eight) hours as needed. , Disp: , Rfl:  .  aspirin 81 MG tablet, Take 81 mg by mouth daily., Disp: , Rfl:  .  atorvastatin (LIPITOR) 80 MG tablet, Take 1 tablet (80 mg total) by mouth daily at 6 PM., Disp: 30 tablet, Rfl: 12 .  baclofen (LIORESAL) 20 MG tablet, TAKE 1 TABLET BY MOUTH TWICE DAILY, Disp: 60 tablet, Rfl: 12 .  losartan (COZAAR) 100 MG tablet, Take 1 tablet (100 mg total) by mouth daily. (Patient taking differently: Take 50 mg by mouth 2 (two) times daily. ), Disp: 30 tablet, Rfl: 12 .  metoprolol (LOPRESSOR) 50 MG tablet, Take 50 mg by mouth 2 (two) times daily., Disp: , Rfl:  .  oxyCODONE (OXYCONTIN) 40 mg 12 hr tablet, Take 1 tablet (40 mg total) by  mouth every 12 (twelve) hours., Disp: 60 tablet, Rfl: 0 .  Oxycodone HCl 10 MG TABS, One pill every 6- 8 hours as needed for pain, Disp: 120 tablet, Rfl: 0 .  ticagrelor (BRILINTA) 90 MG TABS tablet, Take 1 tablet (90 mg total) by mouth 2 (two) times daily., Disp: 60 tablet, Rfl: 12 .  Wheat Dextrin (BENEFIBER) POWD, Stir 2 tsp. TID into 4-8 oz of any non-carbonated beverage or soft food (hot or cold), Disp: 500 g, Rfl: PRN .  abiraterone Acetate (ZYTIGA) 250 MG tablet, Take 4 tablets (1,000 mg total) by mouth daily. Take on an empty stomach 1 hour before or 2 hours after a meal (Patient not taking: Reported on 08/30/2015), Disp: 120 tablet, Rfl: 0  Not on File   Review of Systems  Constitutional: Positive for malaise/fatigue and weight loss. Negative for chills and fever.  HENT: Negative for hearing loss.   Eyes: Negative for blurred vision and double vision.  Respiratory: Negative for cough, shortness of  breath and wheezing.   Cardiovascular: Negative for chest pain, palpitations and leg swelling.  Gastrointestinal: Negative for abdominal pain, blood in stool and heartburn.  Genitourinary: Negative for dysuria, frequency and urgency.  Musculoskeletal: Positive for back pain, joint pain, myalgias and neck pain.  Skin: Negative for rash.  Neurological: Positive for weakness. Negative for dizziness, tremors and headaches.      Objective  Vitals:   08/30/15 1335 08/30/15 1402  BP: 110/75 110/70  Pulse: 76   Resp: 16   Temp: 99 F (37.2 C)   TempSrc: Oral   Weight: 195 lb (88.5 kg)   Height: '5\' 11"'  (1.803 m)     Physical Exam  Constitutional: He is oriented to person, place, and time and well-developed, well-nourished, and in no distress. No distress.  HENT:  Head: Normocephalic and atraumatic.  Eyes: Conjunctivae and EOM are normal. Pupils are equal, round, and reactive to light. No scleral icterus.  Neck: Normal range of motion. Neck supple. Carotid bruit is not present. No thyromegaly present.  Cardiovascular: Normal rate, regular rhythm and normal heart sounds.  Exam reveals no gallop and no friction rub.   No murmur heard. Pulmonary/Chest: Effort normal and breath sounds normal. No respiratory distress. He has no wheezes. He has no rales.  Abdominal: Soft. Bowel sounds are normal. He exhibits no distension, no abdominal bruit and no mass. There is no tenderness.  Musculoskeletal: He exhibits no edema.  Lymphadenopathy:    He has no cervical adenopathy.  Neurological: He is alert and oriented to person, place, and time.  Psychiatric: Mood, memory, affect and judgment normal.  Vitals reviewed.      Recent Results (from the past 2160 hour(s))  CBC with Differential     Status: Abnormal   Collection Time: 06/17/15 12:52 PM  Result Value Ref Range   WBC 7.0 3.8 - 10.6 K/uL   RBC 4.41 4.40 - 5.90 MIL/uL   Hemoglobin 13.8 13.0 - 18.0 g/dL   HCT 40.6 40.0 - 52.0 %   MCV  92.1 80.0 - 100.0 fL   MCH 31.3 26.0 - 34.0 pg   MCHC 34.0 32.0 - 36.0 g/dL   RDW 14.3 11.5 - 14.5 %   Platelets 128 (L) 150 - 440 K/uL   Neutrophils Relative % 78 %   Neutro Abs 5.5 1.4 - 6.5 K/uL   Lymphocytes Relative 7 %   Lymphs Abs 0.5 (L) 1.0 - 3.6 K/uL   Monocytes Relative 12 %  Monocytes Absolute 0.8 0.2 - 1.0 K/uL   Eosinophils Relative 1 %   Eosinophils Absolute 0.1 0 - 0.7 K/uL   Basophils Relative 2 %   Basophils Absolute 0.1 0 - 0.1 K/uL  Urinalysis complete, with microscopic [facility])     Status: Abnormal   Collection Time: 06/22/15  3:14 PM  Result Value Ref Range   Color, Urine YELLOW YELLOW   APPearance CLEAR CLEAR   Glucose, UA NEGATIVE NEGATIVE mg/dL   Bilirubin Urine 1+ (A) NEGATIVE   Ketones, ur TRACE (A) NEGATIVE mg/dL   Specific Gravity, Urine >1.030 (H) 1.005 - 1.030   Hgb urine dipstick NEGATIVE NEGATIVE   pH 5.5 5.0 - 8.0   Protein, ur 30 (A) NEGATIVE mg/dL   Nitrite NEGATIVE NEGATIVE   Leukocytes, UA NEGATIVE NEGATIVE   RBC / HPF NONE SEEN 0 - 5 RBC/hpf   WBC, UA NONE SEEN 0 - 5 WBC/hpf   Bacteria, UA NONE SEEN NONE SEEN   Squamous Epithelial / LPF 0-5 (A) NONE SEEN   Hyaline Casts, UA PRESENT    Granular Casts, UA PRESENT    Ca Oxalate Crys, UA PRESENT   CBC with Differential     Status: Abnormal   Collection Time: 07/14/15  1:25 PM  Result Value Ref Range   WBC 3.2 (L) 3.8 - 10.6 K/uL   RBC 4.12 (L) 4.40 - 5.90 MIL/uL   Hemoglobin 13.0 13.0 - 18.0 g/dL   HCT 37.3 (L) 40.0 - 52.0 %   MCV 90.6 80.0 - 100.0 fL   MCH 31.6 26.0 - 34.0 pg   MCHC 34.9 32.0 - 36.0 g/dL   RDW 14.6 (H) 11.5 - 14.5 %   Platelets 123 (L) 150 - 440 K/uL   Neutrophils Relative % 69 %   Neutro Abs 2.2 1.4 - 6.5 K/uL   Lymphocytes Relative 19 %   Lymphs Abs 0.6 (L) 1.0 - 3.6 K/uL   Monocytes Relative 11 %   Monocytes Absolute 0.4 0.2 - 1.0 K/uL   Eosinophils Relative 1 %   Eosinophils Absolute 0.0 0 - 0.7 K/uL   Basophils Relative 0 %   Basophils Absolute 0.0 0 -  0.1 K/uL  Comprehensive metabolic panel     Status: Abnormal   Collection Time: 07/14/15  1:25 PM  Result Value Ref Range   Sodium 137 135 - 145 mmol/L   Potassium 4.0 3.5 - 5.1 mmol/L   Chloride 107 101 - 111 mmol/L   CO2 25 22 - 32 mmol/L   Glucose, Bld 139 (H) 65 - 99 mg/dL   BUN 11 6 - 20 mg/dL   Creatinine, Ser 0.65 0.61 - 1.24 mg/dL   Calcium 9.1 8.9 - 10.3 mg/dL   Total Protein 6.5 6.5 - 8.1 g/dL   Albumin 3.8 3.5 - 5.0 g/dL   AST 21 15 - 41 U/L   ALT 22 17 - 63 U/L   Alkaline Phosphatase 38 38 - 126 U/L   Total Bilirubin 0.5 0.3 - 1.2 mg/dL   GFR calc non Af Amer >60 >60 mL/min   GFR calc Af Amer >60 >60 mL/min    Comment: (NOTE) The eGFR has been calculated using the CKD EPI equation. This calculation has not been validated in all clinical situations. eGFR's persistently <60 mL/min signify possible Chronic Kidney Disease.    Anion gap 5 5 - 15  PSA     Status: Abnormal   Collection Time: 07/14/15  1:25 PM  Result Value Ref  Range   PSA 14.08 (H) 0.00 - 4.00 ng/mL    Comment: (NOTE) While PSA levels of <=4.0 ng/ml are reported as reference range, some men with levels below 4.0 ng/ml can have prostate cancer and many men with PSA above 4.0 ng/ml do not have prostate cancer.  Other tests such as free PSA, age specific reference ranges, PSA velocity and PSA doubling time may be helpful especially in men less than 47 years old. Performed at [facility]   Testosterone     Status: Abnormal   Collection Time: 07/14/15  1:25 PM  Result Value Ref Range   Testosterone <3 (L) 348 - 1,197 ng/dL    Comment: (NOTE) **Effective July 19, 2015 the reference interval for**  Testosterone, Serum Males >87 years old will be  changing to:           Adult Males:      43 - 916    Comment, Testosterone Comment     Comment: (NOTE) Adult male reference interval is based on a population of lean males up to 64 years old. Performed At: [facility] [place], [place] [id number] Lindon Romp MD BW:[id number]   CBC with Differential     Status: Abnormal   Collection Time: 08/10/15  2:03 PM  Result Value Ref Range   WBC 3.1 (L) 3.8 - 10.6 K/uL   RBC 4.17 (L) 4.40 - 5.90 MIL/uL   Hemoglobin 13.2 13.0 - 18.0 g/dL   HCT 38.7 (L) 40.0 - 52.0 %   MCV 92.7 80.0 - 100.0 fL   MCH 31.7 26.0 - 34.0 pg   MCHC 34.2 32.0 - 36.0 g/dL   RDW 14.5 11.5 - 14.5 %   Platelets 120 (L) 150 - 440 K/uL   Neutrophils Relative % 63 %   Neutro Abs 2.0 1.4 - 6.5 K/uL   Lymphocytes Relative 24 %   Lymphs Abs 0.8 (L) 1.0 - 3.6 K/uL   Monocytes Relative 10 %   Monocytes Absolute 0.3 0.2 - 1.0 K/uL   Eosinophils Relative 2 %   Eosinophils Absolute 0.1 0 - 0.7 K/uL   Basophils Relative 1 %   Basophils Absolute 0.0 0 - 0.1 K/uL  Comprehensive metabolic panel     Status: Abnormal   Collection Time: 08/10/15  2:03 PM  Result Value Ref Range   Sodium 137 135 - 145 mmol/L   Potassium 3.9 3.5 - 5.1 mmol/L   Chloride 103 101 - 111 mmol/L   CO2 28 22 - 32 mmol/L   Glucose, Bld 166 (H) 65 - 99 mg/dL   BUN 11 6 - 20 mg/dL   Creatinine, Ser 0.88 0.61 - 1.24 mg/dL   Calcium 9.6 8.9 - 10.3 mg/dL   Total Protein 6.6 6.5 - 8.1 g/dL   Albumin 4.1 3.5 - 5.0 g/dL   AST 17 15 - 41 U/L   ALT 18 17 - 63 U/L   Alkaline Phosphatase 32 (L) 38 - 126 U/L   Total Bilirubin 0.4 0.3 - 1.2 mg/dL   GFR calc non Af Amer >60 >60 mL/min   GFR calc Af Amer >60 >60 mL/min    Comment: (NOTE) The eGFR has been calculated using the CKD EPI equation. This calculation has not been validated in all clinical situations. eGFR's persistently <60 mL/min signify possible Chronic Kidney Disease.    Anion gap 6 5 - 15  PSA     Status: Abnormal   Collection Time: 08/10/15  2:03 PM  Result Value Ref Range   PSA 15.26 (H) 0.00 - 4.00 ng/mL    Comment: (NOTE) While PSA levels of <=4.0 ng/ml are reported as reference range, some men with levels below 4.0 ng/ml can have prostate cancer and  many men with PSA above 4.0 ng/ml do not have prostate cancer.  Other tests such as free PSA, age specific reference ranges, PSA velocity and PSA doubling time may be helpful especially in men less than 81 years old. Performed at [facility]   CBC     Status: Abnormal   Collection Time: 08/16/15  4:26 PM  Result Value Ref Range   WBC 5.6 3.8 - 10.6 K/uL   RBC 4.10 (L) 4.40 - 5.90 MIL/uL   Hemoglobin 13.3 13.0 - 18.0 g/dL   HCT 38.5 (L) 40.0 - 52.0 %   MCV 93.9 80.0 - 100.0 fL   MCH 32.5 26.0 - 34.0 pg   MCHC 34.6 32.0 - 36.0 g/dL   RDW 14.6 (H) 11.5 - 14.5 %   Platelets 146 (L) 150 - 440 K/uL  Differential     Status: Abnormal   Collection Time: 08/16/15  4:26 PM  Result Value Ref Range   Neutrophils Relative % 78 %   Neutro Abs 4.4 1.4 - 6.5 K/uL   Lymphocytes Relative 11 %   Lymphs Abs 0.6 (L) 1.0 - 3.6 K/uL   Monocytes Relative 10 %   Monocytes Absolute 0.6 0.2 - 1.0 K/uL   Eosinophils Relative 1 %   Eosinophils Absolute 0.0 0 - 0.7 K/uL   Basophils Relative 0 %   Basophils Absolute 0.0 0 - 0.1 K/uL  Protime-INR     Status: None   Collection Time: 08/16/15  4:26 PM  Result Value Ref Range   Prothrombin Time 12.6 11.4 - 15.2 seconds   INR 0.94   APTT     Status: None   Collection Time: 08/16/15  4:26 PM  Result Value Ref Range   aPTT 33 24 - 36 seconds  Comprehensive metabolic panel     Status: Abnormal   Collection Time: 08/16/15  4:26 PM  Result Value Ref Range   Sodium 139 135 - 145 mmol/L   Potassium 3.6 3.5 - 5.1 mmol/L   Chloride 105 101 - 111 mmol/L   CO2 24 22 - 32 mmol/L   Glucose, Bld 171 (H) 65 - 99 mg/dL   BUN 12 6 - 20 mg/dL   Creatinine, Ser 0.82 0.61 - 1.24 mg/dL   Calcium 9.7 8.9 - 10.3 mg/dL   Total Protein 6.7 6.5 - 8.1 g/dL   Albumin 4.3 3.5 - 5.0 g/dL   AST 19 15 - 41 U/L   ALT 15 (L) 17 - 63 U/L   Alkaline Phosphatase 35 (L) 38 - 126 U/L   Total Bilirubin 0.6 0.3 - 1.2 mg/dL   GFR calc non Af Amer >60 >60 mL/min   GFR calc Af  Amer >60 >60 mL/min    Comment: (NOTE) The eGFR has been calculated using the CKD EPI equation. This calculation has not been validated in all clinical situations. eGFR's persistently <60 mL/min signify possible Chronic Kidney Disease.    Anion gap 10 5 - 15  Troponin I     Status: Abnormal   Collection Time: 08/16/15  4:26 PM  Result Value Ref Range   Troponin I 0.03 (HH) <0.03 ng/mL    Comment: CRITICAL RESULT CALLED TO, READ BACK BY AND VERIFIED WITH  HAL HERSHENSON AT 5732 08/16/2015 BY TFK.   Lipid panel     Status: Abnormal   Collection Time: 08/16/15  4:26 PM  Result Value Ref Range   Cholesterol 226 (H) 0 - 200 mg/dL   Triglycerides 199 (H) <150 mg/dL   HDL 45 >40 mg/dL   Total CHOL/HDL Ratio 5.0 RATIO   VLDL 40 0 - 40 mg/dL   LDL Cholesterol 141 (H) 0 - 99 mg/dL    Comment:        Total Cholesterol/HDL:CHD Risk Coronary Heart Disease Risk Table                     Men   Women  1/2 Average Risk   3.4   3.3  Average Risk       5.0   4.4  2 X Average Risk   9.6   7.1  3 X Average Risk  23.4   11.0        Use the calculated Patient Ratio above and the CHD Risk Table to determine the patient's CHD Risk.        ATP III CLASSIFICATION (LDL):  <100     mg/dL   Optimal  100-129  mg/dL   Near or Above                    Optimal  130-159  mg/dL   Borderline  160-189  mg/dL   High  >190     mg/dL   Very High   Cardiac catheterization     Status: None   Collection Time: 08/16/15  5:06 PM  Result Value Ref Range   Cath EF Quantitative 50 %  MRSA PCR Screening     Status: None   Collection Time: 08/16/15  6:33 PM  Result Value Ref Range   MRSA by PCR NEGATIVE NEGATIVE    Comment:        The GeneXpert MRSA Assay (FDA approved for NASAL specimens only), is one component of a comprehensive MRSA colonization surveillance program. It is not intended to diagnose MRSA infection nor to guide or monitor treatment for MRSA infections.   Glucose, capillary     Status:  Abnormal   Collection Time: 08/16/15  6:33 PM  Result Value Ref Range   Glucose-Capillary 130 (H) 65 - 99 mg/dL  Basic metabolic panel     Status: None   Collection Time: 08/17/15  4:16 AM  Result Value Ref Range   Sodium 142 135 - 145 mmol/L   Potassium 3.7 3.5 - 5.1 mmol/L   Chloride 110 101 - 111 mmol/L   CO2 26 22 - 32 mmol/L   Glucose, Bld 77 65 - 99 mg/dL   BUN 10 6 - 20 mg/dL   Creatinine, Ser 0.69 0.61 - 1.24 mg/dL   Calcium 9.2 8.9 - 10.3 mg/dL   GFR calc non Af Amer >60 >60 mL/min   GFR calc Af Amer >60 >60 mL/min    Comment: (NOTE) The eGFR has been calculated using the CKD EPI equation. This calculation has not been validated in all clinical situations. eGFR's persistently <60 mL/min signify possible Chronic Kidney Disease.    Anion gap 6 5 - 15  CBC     Status: Abnormal   Collection Time: 08/17/15  4:16 AM  Result Value Ref Range   WBC 5.0 3.8 - 10.6 K/uL   RBC 4.07 (L) 4.40 - 5.90 MIL/uL   Hemoglobin 13.2 13.0 - 18.0  g/dL   HCT 38.1 (L) 40.0 - 52.0 %   MCV 93.7 80.0 - 100.0 fL   MCH 32.5 26.0 - 34.0 pg   MCHC 34.7 32.0 - 36.0 g/dL   RDW 14.6 (H) 11.5 - 14.5 %   Platelets 141 (L) 150 - 440 K/uL  ECHOCARDIOGRAM COMPLETE     Status: None   Collection Time: 08/17/15  3:43 PM  Result Value Ref Range   Weight 3,174.62 oz   Height 71 in   BP 100/57 mmHg  Troponin I (q 6hr x 3)     Status: Abnormal   Collection Time: 08/17/15  8:52 PM  Result Value Ref Range   Troponin I 0.30 (HH) <0.03 ng/mL    Comment: CRITICAL VALUE NOTED. VALUE IS CONSISTENT WITH PREVIOUSLY REPORTED/CALLED VALUE TLB   CBC with Differential     Status: Abnormal   Collection Time: 08/18/15  5:10 PM  Result Value Ref Range   WBC 5.9 3.8 - 10.6 K/uL   RBC 4.08 (L) 4.40 - 5.90 MIL/uL   Hemoglobin 13.2 13.0 - 18.0 g/dL   HCT 38.8 (L) 40.0 - 52.0 %   MCV 94.9 80.0 - 100.0 fL   MCH 32.3 26.0 - 34.0 pg   MCHC 34.0 32.0 - 36.0 g/dL   RDW 14.7 (H) 11.5 - 14.5 %   Platelets 143 (L) 150 - 440  K/uL   Neutrophils Relative % 69 %   Neutro Abs 4.1 1.4 - 6.5 K/uL   Lymphocytes Relative 15 %   Lymphs Abs 0.9 (L) 1.0 - 3.6 K/uL   Monocytes Relative 14 %   Monocytes Absolute 0.8 0.2 - 1.0 K/uL   Eosinophils Relative 1 %   Eosinophils Absolute 0.1 0 - 0.7 K/uL   Basophils Relative 1 %   Basophils Absolute 0.0 0 - 0.1 K/uL     Assessment & Plan  Problem List Items Addressed This Visit      Cardiovascular and Mediastinum   Essential (primary) hypertension - Primary   Acute ST elevation myocardial infarction (STEMI) involving right coronary artery without development of Q waves (HCC)     Genitourinary   Prostate cancer (Chronic)     Other   Cancer associated pain (Chronic)   Cervical spinal stenosis   HLD (hyperlipidemia)    Other Visit Diagnoses   None.     No orders of the defined types were placed in this encounter. 1. Essential (primary) hypertension contg meds except decrease dose of Losartan to 1/2  As directed)  2. Acute ST elevation myocardial infarction (STEMI) involving right coronary artery without development of Q waves (HCC) Cont meds  3. Prostate cancer   4. HLD (hyperlipidemia) Cont meds  5. Cervical spinal stenosis   6. Cancer associated pain Cont meds

## 2015-08-31 ENCOUNTER — Inpatient Hospital Stay: Payer: 59

## 2015-08-31 ENCOUNTER — Inpatient Hospital Stay (HOSPITAL_BASED_OUTPATIENT_CLINIC_OR_DEPARTMENT_OTHER): Payer: 59 | Admitting: Internal Medicine

## 2015-08-31 VITALS — BP 144/79 | HR 73 | Temp 96.9°F | Resp 18 | Wt 197.1 lb

## 2015-08-31 DIAGNOSIS — Z923 Personal history of irradiation: Secondary | ICD-10-CM | POA: Diagnosis not present

## 2015-08-31 DIAGNOSIS — F329 Major depressive disorder, single episode, unspecified: Secondary | ICD-10-CM | POA: Diagnosis not present

## 2015-08-31 DIAGNOSIS — Z79899 Other long term (current) drug therapy: Secondary | ICD-10-CM | POA: Diagnosis not present

## 2015-08-31 DIAGNOSIS — Z87442 Personal history of urinary calculi: Secondary | ICD-10-CM | POA: Diagnosis not present

## 2015-08-31 DIAGNOSIS — I10 Essential (primary) hypertension: Secondary | ICD-10-CM | POA: Diagnosis not present

## 2015-08-31 DIAGNOSIS — Z7982 Long term (current) use of aspirin: Secondary | ICD-10-CM | POA: Diagnosis not present

## 2015-08-31 DIAGNOSIS — C7951 Secondary malignant neoplasm of bone: Secondary | ICD-10-CM | POA: Diagnosis not present

## 2015-08-31 DIAGNOSIS — C61 Malignant neoplasm of prostate: Secondary | ICD-10-CM

## 2015-08-31 DIAGNOSIS — I252 Old myocardial infarction: Secondary | ICD-10-CM | POA: Diagnosis not present

## 2015-08-31 DIAGNOSIS — I2510 Atherosclerotic heart disease of native coronary artery without angina pectoris: Secondary | ICD-10-CM | POA: Diagnosis not present

## 2015-08-31 DIAGNOSIS — F17210 Nicotine dependence, cigarettes, uncomplicated: Secondary | ICD-10-CM

## 2015-08-31 DIAGNOSIS — Z79818 Long term (current) use of other agents affecting estrogen receptors and estrogen levels: Secondary | ICD-10-CM

## 2015-08-31 MED ORDER — DENOSUMAB 120 MG/1.7ML ~~LOC~~ SOLN
120.0000 mg | Freq: Once | SUBCUTANEOUS | Status: AC
  Administered 2015-08-31: 120 mg via SUBCUTANEOUS

## 2015-08-31 MED ORDER — LEUPROLIDE ACETATE (3 MONTH) 22.5 MG IM KIT
22.5000 mg | PACK | Freq: Once | INTRAMUSCULAR | Status: AC
  Administered 2015-08-31: 22.5 mg via INTRAMUSCULAR
  Filled 2015-08-31: qty 22.5

## 2015-08-31 NOTE — Progress Notes (Signed)
5. He  Dr. Tollie Pizza was 100 beats [name] OFFICE PROGRESS NOTE  Patient Care Team: Arlis Porta., MD as PCP - General (Family Medicine)   SUMMARY OF ONCOLOGIC HISTORY: Oncology History   # 2011- PROSTATE CANCER [Gleason 3+4]; s/p Prostatectomy [ also involved bladder neck/ECP; Dr.Polaseck]; July 2014- Biochemical recurrence [PSA 14]- started on Zoladex [Dr.Pandit]; lost to follow up.  # JAN 2017- STAGE IV METASTATIC PROSTATE Cancer to Bone- Feb 13th, 2017-  Lupron q 50m [~end of feb]; PSA: 1021; Declined Chemo; April 2017 [xofigo; Dr.Crystal]  # Mets to bone- start X-geva q 93M [May 30th]  # Smoker/ chronic pain/pain clinic      Malignant neoplasm of prostate ([place])   08/01/2010 Initial Diagnosis    Malignant neoplasm of prostate [facility])       Prostate cancer metastatic to bone ([place])   12/08/2014 Initial Diagnosis    Prostate cancer metastatic to bone [facility])       INTERVAL HISTORY:   [name]  With above history of prostate cancer metastatic castrate resistant currently on Lupron + Zytiga is here for follow-up. Patient in the interim is also getting Xofigo every month. His status post 5 treatments.  In the interim patient- patient was admitted to the hospital for non-STEMI; status post stenting. Patient started on Zytiga 3 days prior to the event. His currently on Zytiga given the acute events. Patient states that his dizziness/ Mid scapular pain improved.   He has more energy now.  He continues to take narcotic pain medication.    Appetite is fair. Weight is fairly stable . Hot flashes improved. No abdominal pain. No blood in stools. No new shortness of breath or chest pain or cough.   REVIEW OF SYSTEMS:  A complete 10 point review of system is done which is negative except mentioned above/history of present illness.   PAST MEDICAL HISTORY :  Past Medical History:  Diagnosis Date  . ASCVD (arteriosclerotic cardiovascular  disease)   . Back pain   . Back pain 10/09/2012  . Bone cancer ([place])   . Depression   . History of kidney stones   . Hypertension   . Joint pain   . Prostate cancer ([place])   . SOB (shortness of breath) 10/09/2012  . Unable to ambulate 10/09/2012    PAST SURGICAL HISTORY :   Past Surgical History:  Procedure Laterality Date  . CARDIAC CATHETERIZATION     [facility]  . CARDIAC CATHETERIZATION N/A 08/16/2015   Procedure: Left Heart Cath and Coronary Angiography;  Surgeon: Yolonda Kida, MD;  Location: [place] CV LAB;  Service: Cardiovascular;  Laterality: N/A;  . CARDIAC CATHETERIZATION N/A 08/16/2015   Procedure: Coronary Stent Intervention;  Surgeon: Yolonda Kida, MD;  Location: [place] CV LAB;  Service: Cardiovascular;  Laterality: N/A;  . CERVIX SURGERY    . PROSTATECTOMY    . SPINE SURGERY    . TONSILLECTOMY    . WRIST SURGERY      FAMILY HISTORY :   Family History  Problem Relation Age of Onset  . Heart attack Mother   . Hypertension Mother   . Heart attack Father     SOCIAL HISTORY:   Social History  Substance Use Topics  . Smoking status: Heavy Tobacco Smoker    Packs/day: 1.00    Years: 40.00    Types: Cigarettes  . Smokeless tobacco: Never Used     Comment: "I use a pack  a day in 24 hours. I have quit and started in past."  . Alcohol use No    ALLERGIES:  has no allergies on file.  MEDICATIONS:  Current Outpatient Prescriptions  Medication Sig Dispense Refill  . acetaminophen (TYLENOL) 500 MG tablet Take 1,500 mg by mouth every 8 (eight) hours as needed.     Marland Kitchen aspirin 81 MG tablet Take 81 mg by mouth daily.    Marland Kitchen atorvastatin (LIPITOR) 80 MG tablet Take 1 tablet (80 mg total) by mouth daily at 6 PM. 30 tablet 12  . baclofen (LIORESAL) 20 MG tablet TAKE 1 TABLET BY MOUTH TWICE DAILY 60 tablet 12  . losartan (COZAAR) 50 MG tablet Take 1 table once daily 30 tablet 12  . metoprolol (LOPRESSOR) 50 MG tablet Take 50 mg by mouth 2 (two) times daily.     Marland Kitchen oxyCODONE (OXYCONTIN) 40 mg 12 hr tablet Take 1 tablet (40 mg total) by mouth every 12 (twelve) hours. 60 tablet 0  . Oxycodone HCl 10 MG TABS One pill every 6- 8 hours as needed for pain 120 tablet 0  . ticagrelor (BRILINTA) 90 MG TABS tablet Take 1 tablet (90 mg total) by mouth 2 (two) times daily. 60 tablet 12  . Wheat Dextrin (BENEFIBER) POWD Stir 2 tsp. TID into 4-8 oz of any non-carbonated beverage or soft food (hot or cold) 500 g PRN  . abiraterone Acetate (ZYTIGA) 250 MG tablet Take 4 tablets (1,000 mg total) by mouth daily. Take on an empty stomach 1 hour before or 2 hours after a meal (Patient not taking: Reported on 08/30/2015) 120 tablet 0   No current facility-administered medications for this visit.     PHYSICAL EXAMINATION: ECOG PERFORMANCE STATUS: 1 - Symptomatic but completely ambulatory  BP (!) 144/79 (BP Location: Left Arm, Patient Position: Sitting)   Pulse 73   Temp (!) 96.9 F (36.1 C) (Tympanic)   Resp 18   Wt 197 lb 1.5 oz (89.4 kg)   BMI 27.49 kg/m   Filed Weights   08/31/15 1140  Weight: 197 lb 1.5 oz (89.4 kg)   GENERAL: Well-nourished well-developed; Alert, no distress and comfortable. Accompanied by his wife. EYES: no pallor or icterus OROPHARYNX: no thrush or ulceration; Upper dentures. NECK: supple, no masses felt LYMPH: no palpable lymphadenopathy in the cervical, axillary or inguinal regions LUNGS: clear to auscultation and No wheeze or crackles HEART/CVS: regular rate & rhythm and no murmurs; No lower extremity edema ABDOMEN:abdomen soft, non-tender and normal bowel sounds; No hepatomegaly. Musculoskeletal:no cyanosis of digits and no clubbing; No significant tenderness noted. PSYCH: alert & oriented x 3 with fluent speech NEURO: no focal motor/sensory deficits SKIN: no rashes or significant lesions   LABORATORY DATA:  I have reviewed the data as listed    Component Value Date/Time   NA 142 08/17/2015 0416   NA 140 07/13/2013  1542   K 3.7 08/17/2015 0416   K 4.3 07/13/2013 1542   CL 110 08/17/2015 0416   CL 107 07/13/2013 1542   CO2 26 08/17/2015 0416   CO2 26 07/13/2013 1542   GLUCOSE 77 08/17/2015 0416   GLUCOSE 106 (H) 07/13/2013 1542   BUN 10 08/17/2015 0416   BUN 11 07/13/2013 1542   CREATININE 0.69 08/17/2015 0416   CREATININE 1.01 07/13/2013 1542   CALCIUM 9.2 08/17/2015 0416   CALCIUM 9.9 07/13/2013 1542   PROT 6.7 08/16/2015 1626   PROT 8.1 07/13/2013 1542   ALBUMIN 4.3 08/16/2015 1626  ALBUMIN 4.3 07/13/2013 1542   AST 19 08/16/2015 1626   AST 26 07/13/2013 1542   ALT 15 (L) 08/16/2015 1626   ALT 32 07/13/2013 1542   ALKPHOS 35 (L) 08/16/2015 1626   ALKPHOS 74 07/13/2013 1542   BILITOT 0.6 08/16/2015 1626   BILITOT 0.4 07/13/2013 1542   GFRNONAA >60 08/17/2015 0416   GFRNONAA >60 07/13/2013 1542   GFRAA >60 08/17/2015 0416   GFRAA >60 07/13/2013 1542    No results found for: SPEP, UPEP  Lab Results  Component Value Date   WBC 5.9 08/18/2015   NEUTROABS 4.1 08/18/2015   HGB 13.2 08/18/2015   HCT 38.8 (L) 08/18/2015   MCV 94.9 08/18/2015   PLT 143 (L) 08/18/2015      Chemistry      Component Value Date/Time   NA 142 08/17/2015 0416   NA 140 07/13/2013 1542   K 3.7 08/17/2015 0416   K 4.3 07/13/2013 1542   CL 110 08/17/2015 0416   CL 107 07/13/2013 1542   CO2 26 08/17/2015 0416   CO2 26 07/13/2013 1542   BUN 10 08/17/2015 0416   BUN 11 07/13/2013 1542   CREATININE 0.69 08/17/2015 0416   CREATININE 1.01 07/13/2013 1542      Component Value Date/Time   CALCIUM 9.2 08/17/2015 0416   CALCIUM 9.9 07/13/2013 1542   ALKPHOS 35 (L) 08/16/2015 1626   ALKPHOS 74 07/13/2013 1542   AST 19 08/16/2015 1626   AST 26 07/13/2013 1542   ALT 15 (L) 08/16/2015 1626   ALT 32 07/13/2013 1542   BILITOT 0.6 08/16/2015 1626   BILITOT 0.4 07/13/2013 1542        ASSESSMENT & PLAN:    Prostate cancer metastatic to bone ([place]) Castrate resistant prostate cancer-  Patient  currently on Lupron androgen deprivation therapy/ on Xofigo radiation oncology; # 5/6 last week ago.  # PSA- Aug 2017- 15; down from 34 in May; April 33. Testosterone castrate level less than 3. Re-start Zytiga today [4 pills with prednisone BID]. Again discussed with the patient that treatments are palliative/incurable. And are indefinite treatments.  #   Back pain secondary malignancy status post radiation- stable/ on oxycontin 40 mg BID; oxycodone 10 mg every 8-12 hours.   # Non-STEMI status post stenting/ significant improvement of symptoms. I do not think Zytiga contributed to his cardiac event.  # Follow-up with me in approximately 6 weeks;  proceed with X-geva/Lupron q 7M.  Recommend ca+ Vit D BID.   # 25 minutes face-to-face with the patient discussing the above plan of care; more than 50% of time spent on prognosis/ natural history; counseling and coordination.      [name], MD 08/31/2015 12:18 PM

## 2015-08-31 NOTE — Assessment & Plan Note (Addendum)
Castrate resistant prostate cancer-  Patient currently on Lupron androgen deprivation therapy/ on Xofigo radiation oncology; # 5/6 last week ago.  # PSA- Aug 2017- 15; down from 46 in May; April 33. Testosterone castrate level less than 3. Re-start Zytiga today [4 pills with prednisone BID]. Again discussed with the patient that treatments are palliative/incurable. And are indefinite treatments.  #   Back pain secondary malignancy status post radiation- stable/ on oxycontin 40 mg BID; oxycodone 10 mg every 8-12 hours.   # Non-STEMI status post stenting/ significant improvement of symptoms. I do not think Zytiga contributed to his cardiac event.  # Follow-up with me in approximately 6 weeks;  proceed with X-geva/Lupron q 4M.  Recommend ca+ Vit D BID.   # 25 minutes face-to-face with the patient discussing the above plan of care; more than 50% of time spent on prognosis/ natural history; counseling and coordination.

## 2015-09-03 DIAGNOSIS — C61 Malignant neoplasm of prostate: Secondary | ICD-10-CM | POA: Insufficient documentation

## 2015-09-03 DIAGNOSIS — Z510 Encounter for antineoplastic radiation therapy: Secondary | ICD-10-CM | POA: Insufficient documentation

## 2015-09-15 ENCOUNTER — Inpatient Hospital Stay: Payer: 59 | Attending: Radiation Oncology

## 2015-09-15 ENCOUNTER — Other Ambulatory Visit: Payer: Self-pay

## 2015-09-15 DIAGNOSIS — C61 Malignant neoplasm of prostate: Secondary | ICD-10-CM

## 2015-09-15 DIAGNOSIS — C7951 Secondary malignant neoplasm of bone: Secondary | ICD-10-CM | POA: Diagnosis not present

## 2015-09-15 LAB — CBC WITH DIFFERENTIAL/PLATELET
BASOS ABS: 0 10*3/uL (ref 0–0.1)
BASOS PCT: 1 %
EOS ABS: 0.1 10*3/uL (ref 0–0.7)
Eosinophils Relative: 1 %
HEMATOCRIT: 35.8 % — AB (ref 40.0–52.0)
HEMOGLOBIN: 12.2 g/dL — AB (ref 13.0–18.0)
Lymphocytes Relative: 17 %
Lymphs Abs: 0.7 10*3/uL — ABNORMAL LOW (ref 1.0–3.6)
MCH: 32.3 pg (ref 26.0–34.0)
MCHC: 34.2 g/dL (ref 32.0–36.0)
MCV: 94.4 fL (ref 80.0–100.0)
MONOS PCT: 10 %
Monocytes Absolute: 0.4 10*3/uL (ref 0.2–1.0)
NEUTROS ABS: 2.9 10*3/uL (ref 1.4–6.5)
NEUTROS PCT: 71 %
Platelets: 138 10*3/uL — ABNORMAL LOW (ref 150–440)
RBC: 3.79 MIL/uL — AB (ref 4.40–5.90)
RDW: 14.3 % (ref 11.5–14.5)
WBC: 4 10*3/uL (ref 3.8–10.6)

## 2015-09-16 DIAGNOSIS — C61 Malignant neoplasm of prostate: Secondary | ICD-10-CM | POA: Diagnosis not present

## 2015-09-16 DIAGNOSIS — Z510 Encounter for antineoplastic radiation therapy: Secondary | ICD-10-CM | POA: Diagnosis not present

## 2015-09-16 DIAGNOSIS — C7951 Secondary malignant neoplasm of bone: Secondary | ICD-10-CM | POA: Diagnosis not present

## 2015-09-21 ENCOUNTER — Other Ambulatory Visit: Payer: Self-pay | Admitting: *Deleted

## 2015-09-21 DIAGNOSIS — C7951 Secondary malignant neoplasm of bone: Principal | ICD-10-CM

## 2015-09-21 DIAGNOSIS — G893 Neoplasm related pain (acute) (chronic): Secondary | ICD-10-CM

## 2015-09-21 DIAGNOSIS — C61 Malignant neoplasm of prostate: Secondary | ICD-10-CM

## 2015-09-21 DIAGNOSIS — G8929 Other chronic pain: Secondary | ICD-10-CM

## 2015-09-21 MED ORDER — OXYCODONE HCL 10 MG PO TABS: ORAL_TABLET | ORAL | 0 refills | Status: DC

## 2015-09-21 MED ORDER — OXYCODONE HCL ER 40 MG PO T12A: 40.0000 mg | EXTENDED_RELEASE_TABLET | Freq: Two times a day (BID) | ORAL | 0 refills | Status: DC

## 2015-09-21 NOTE — Discharge Summary (Signed)
Physician Discharge Summary  Patient ID: Robert Short MRN: [id number] DOB/AGE: 09/08/1950 65 y.o.  Admit date: 08/16/2015 Discharge date: 08/18/15 Admission Diagnoses:STEMI inferior wall  Discharge Diagnoses:  Active Problems:   STEMI (ST elevation myocardial infarction) ([place])   Acute ST elevation myocardial infarction (STEMI) involving right coronary artery without development of Q waves [facility])   Discharged Condition: stable  Hospital Course: Patient was admitted on August 14 with a STEMI inferior wall ST elevation he was a late presenting her current symptoms been ongoing since the previous day. Patient complaining of neck and back pain 15/10 he has a history of prostate cancer and thought that it may be related to his chronic prostate cancer with bone involvement. The patient is reluctant to undergo cardiac catheter but eventually agreed he was taken directly to the cardiac catheter lab diagnostic cardiac catheter was performed which showed high-grade infarct related artery and distal right PL. 99% with TIMI 3 flow. Patient had moderate disease in the other arteries with preserved left ventricular function was 55%. Intervention was performed with deployment of a 3.0 x 18 mm Xience DES to distal right PL. Patient had significant improvement in his symptoms EKG improved patient was then transported to the CCU for recovery. Patient states that his neck and back pain immediately improved and he started ambulating was ready for discharge by 08/18/2015. Patient was discharged on aspirin as well as Brilinta.  Consults: cardiology and internal medicine hospitalist  Significant Diagnostic Studies: cardiac graphics: Telemetry: showed normal sinus rhythm throughout his hospitalization, ECG: ST elevation inferiorly which resolved after placement of and Echocardiogram: preserved left ventricular function on echocardiogram 08/17/2015 and angiography: Cardiac catheter on 08/16/2015 with high-grade lesion in  the distal RCA with subsequent improvement after PCI and stent deployment of the DES stent.  Treatments: IV hydration, analgesia: Fentanyl, cardiac meds: metoprolol and Lipitor aspirin, anticoagulation: ASA and Brilinta and procedures: Cardiac cath PCI and stent to distal RCA  Discharge Exam: Blood pressure (!) 153/85, pulse 76, temperature 97.8 F (36.6 C), temperature source Oral, resp. rate 17, height 5\' 11"  (1.803 m), weight 90 kg (198 lb 6.6 oz), SpO2 100 %. General appearance: alert, cooperative and appears stated age Neck: no adenopathy, no carotid bruit, no JVD, supple, symmetrical, trachea midline and thyroid not enlarged, symmetric, no tenderness/mass/nodules Resp: clear to auscultation bilaterally Cardio: regular rate and rhythm, S1, S2 normal, no murmur, click, rub or gallop GI: soft, non-tender; bowel sounds normal; no masses,  no organomegaly Extremities: extremities normal, atraumatic, no cyanosis or edema Pulses: 2+ and symmetric Skin: Skin color, texture, turgor normal. No rashes or lesions  Disposition: 01-Home or Self Care  Discharge Instructions    AMB Referral to Cardiac Rehabilitation - Phase II    Complete by:  As directed    Diagnosis:   STEMI Coronary Stents         Medication List    STOP taking these medications   dexamethasone 4 MG tablet Commonly known as:  DECADRON   enzalutamide 40 MG capsule Commonly known as:  XTANDI   losartan 100 MG tablet Commonly known as:  COZAAR   oxyCODONE 40 mg 12 hr tablet Commonly known as:  OXYCONTIN   Oxycodone HCl 10 MG Tabs     TAKE these medications   abiraterone Acetate 250 MG tablet Commonly known as:  ZYTIGA Take 4 tablets (1,000 mg total) by mouth daily. Take on an empty stomach 1 hour before or 2 hours after a meal   acetaminophen 500  MG tablet Commonly known as:  TYLENOL Take 1,500 mg by mouth every 8 (eight) hours as needed.   aspirin 81 MG tablet Take 81 mg by mouth daily.   atorvastatin  80 MG tablet Commonly known as:  LIPITOR Take 1 tablet (80 mg total) by mouth daily at 6 PM.   baclofen 20 MG tablet Commonly known as:  LIORESAL TAKE 1 TABLET BY MOUTH TWICE DAILY   BENEFIBER Powd Stir 2 tsp. TID into 4-8 oz of any non-carbonated beverage or soft food (hot or cold)   metoprolol 50 MG tablet Commonly known as:  LOPRESSOR Take 50 mg by mouth 2 (two) times daily.   ticagrelor 90 MG Tabs tablet Commonly known as:  BRILINTA Take 1 tablet (90 mg total) by mouth 2 (two) times daily.        SignedLujean Amel D. 09/21/2015, 12:51 PM

## 2015-09-22 ENCOUNTER — Ambulatory Visit: Payer: 59 | Admitting: Radiation Oncology

## 2015-09-23 ENCOUNTER — Ambulatory Visit
Admission: RE | Admit: 2015-09-23 | Discharge: 2015-09-23 | Disposition: A | Discharge status: Home / Self Care | Payer: 59 | Source: Ambulatory Visit | Attending: Radiation Oncology | Admitting: Radiation Oncology

## 2015-09-23 DIAGNOSIS — Z510 Encounter for antineoplastic radiation therapy: Secondary | ICD-10-CM | POA: Diagnosis not present

## 2015-09-23 DIAGNOSIS — C7951 Secondary malignant neoplasm of bone: Secondary | ICD-10-CM | POA: Diagnosis not present

## 2015-09-23 DIAGNOSIS — C61 Malignant neoplasm of prostate: Secondary | ICD-10-CM

## 2015-09-23 NOTE — Progress Notes (Signed)
Radiation Oncology Follow up Note  Name: Robert Short   Date:   09/23/2015 MRN:  [id number] DOB: October 15, 1950    This 65 y.o. male presents to the clinic today for sixth and final Xofigo reatment.  REFERRING PROVIDER: Arlis Porta., MD  HPI:  Patient is a 65 year old male with stage IV prostate cancer originally status post prostatectomy in 2011 for Gleason 7 (3+4) adenocarcinoma of the prostate involving the bladder neck with biochemical recurrence. In July 2014. He was started on case adduction Lupron declined chemotherapy has been started on X Geva every 3 months. I recently completed palliative radiation therapy to his SI joints as well as a single fraction of 800 cGy to his right proximal femur and acetabulum. He is doing much better with much improved pain control in his bones. He is again discussed with any chemotherapy options and has declined chemotherapy. He is seen today for his sixth  Xofigo injection.  Peripheral line was started on the patient and 20 cc of normal saline were passed to make sure there was patency of the lines. Trudi Ida was administered over a 10 minute infusion push by radiation oncologist. After completion of IV push of Xofigo 30 cc an additional saline were passed through the peripheral line. Oral lines syringes drapes and original container of Xofigo were then taken to nuclear medicine for storage. Patient tolerated the procedure well without side effect or complaint. Patient has anti-emetic medication. Have scheduled the patient for a three-week followup to check on his counts. Patient is to call sooner with any side effects or complaints.   COMPLICATIONS OF TREATMENT: none  FOLLOW UP COMPLIANCE: keeps appointments   PHYSICAL EXAM:  There were no vitals taken for this visit. Well-developed well-nourished patient in NAD. HEENT reveals PERLA, EOMI, discs not visualized.  Oral cavity is clear. No oral mucosal lesions are identified. Neck is clear without  evidence of cervical or supraclavicular adenopathy. Lungs are clear to A&P. Cardiac examination is essentially unremarkable with regular rate and rhythm without murmur rub or thrill. Abdomen is benign with no organomegaly or masses noted. Motor sensory and DTR levels are equal and symmetric in the upper and lower extremities. Cranial nerves II through XII are grossly intact. Proprioception is intact. No peripheral adenopathy or edema is identified. No motor or sensory levels are noted. Crude visual fields are within normal range.  RADIOLOGY RESULTS: No current films for review  PLAN: Present time patient is completed his course of radium 225. I'm please was overall progress. Patient has had improvement in his pain from his bone metastasis. I've asked to see him back in 1 month for follow-up. He continues close follow-up care with medical oncology. Patient family know to call sooner with any concerns.  I would like to take this opportunity to thank you for allowing me to participate in the care of your patient.Armstead Peaks., MD

## 2015-10-05 ENCOUNTER — Inpatient Hospital Stay: Payer: 59 | Attending: Radiation Oncology

## 2015-10-05 DIAGNOSIS — Z87442 Personal history of urinary calculi: Secondary | ICD-10-CM | POA: Insufficient documentation

## 2015-10-05 DIAGNOSIS — I10 Essential (primary) hypertension: Secondary | ICD-10-CM | POA: Diagnosis not present

## 2015-10-05 DIAGNOSIS — F17210 Nicotine dependence, cigarettes, uncomplicated: Secondary | ICD-10-CM | POA: Insufficient documentation

## 2015-10-05 DIAGNOSIS — Z79899 Other long term (current) drug therapy: Secondary | ICD-10-CM | POA: Insufficient documentation

## 2015-10-05 DIAGNOSIS — C7951 Secondary malignant neoplasm of bone: Secondary | ICD-10-CM | POA: Diagnosis not present

## 2015-10-05 DIAGNOSIS — F329 Major depressive disorder, single episode, unspecified: Secondary | ICD-10-CM | POA: Insufficient documentation

## 2015-10-05 DIAGNOSIS — C61 Malignant neoplasm of prostate: Secondary | ICD-10-CM | POA: Diagnosis not present

## 2015-10-05 DIAGNOSIS — I2510 Atherosclerotic heart disease of native coronary artery without angina pectoris: Secondary | ICD-10-CM | POA: Diagnosis not present

## 2015-10-05 LAB — COMPREHENSIVE METABOLIC PANEL
ALBUMIN: 4.1 g/dL (ref 3.5–5.0)
ALT: 17 U/L (ref 17–63)
AST: 20 U/L (ref 15–41)
Alkaline Phosphatase: 41 U/L (ref 38–126)
Anion gap: 8 (ref 5–15)
BUN: 11 mg/dL (ref 6–20)
CO2: 26 mmol/L (ref 22–32)
Calcium: 9.2 mg/dL (ref 8.9–10.3)
Chloride: 106 mmol/L (ref 101–111)
Creatinine, Ser: 0.78 mg/dL (ref 0.61–1.24)
GFR calc non Af Amer: >60 mL/min (ref >60)
Glucose, Bld: 137 mg/dL — ABNORMAL HIGH (ref 65–99)
POTASSIUM: 4 mmol/L (ref 3.5–5.1)
SODIUM: 140 mmol/L (ref 135–145)
TOTAL PROTEIN: 6.5 g/dL (ref 6.5–8.1)
Total Bilirubin: 0.3 mg/dL (ref 0.3–1.2)

## 2015-10-05 LAB — CBC WITH DIFFERENTIAL/PLATELET
BASOS PCT: 1 %
Basophils Absolute: 0 10*3/uL (ref 0–0.1)
EOS ABS: 0.1 10*3/uL (ref 0–0.7)
EOS PCT: 3 %
HCT: 35.6 % — ABNORMAL LOW (ref 40.0–52.0)
Hemoglobin: 12.1 g/dL — ABNORMAL LOW (ref 13.0–18.0)
Lymphocytes Relative: 20 %
Lymphs Abs: 0.5 10*3/uL — ABNORMAL LOW (ref 1.0–3.6)
MCH: 32.6 pg (ref 26.0–34.0)
MCHC: 33.9 g/dL (ref 32.0–36.0)
MCV: 95.9 fL (ref 80.0–100.0)
Monocytes Absolute: 0.4 10*3/uL (ref 0.2–1.0)
Monocytes Relative: 14 %
Neutro Abs: 1.6 10*3/uL (ref 1.4–6.5)
Neutrophils Relative %: 62 %
PLATELETS: 139 10*3/uL — AB (ref 150–440)
RBC: 3.71 MIL/uL — AB (ref 4.40–5.90)
RDW: 14 % (ref 11.5–14.5)
WBC: 2.6 10*3/uL — AB (ref 3.8–10.6)

## 2015-10-06 LAB — PSA: PSA: 1.4 ng/mL (ref 0.00–4.00)

## 2015-10-11 ENCOUNTER — Encounter: Payer: Self-pay | Admitting: *Deleted

## 2015-10-11 ENCOUNTER — Encounter: Payer: Self-pay | Admitting: Family Medicine

## 2015-10-11 ENCOUNTER — Ambulatory Visit (INDEPENDENT_AMBULATORY_CARE_PROVIDER_SITE_OTHER): Payer: 59 | Admitting: Family Medicine

## 2015-10-11 VITALS — BP 125/75 | HR 88 | Temp 98.9°F | Resp 16 | Ht 71.0 in | Wt 197.0 lb

## 2015-10-11 DIAGNOSIS — G893 Neoplasm related pain (acute) (chronic): Secondary | ICD-10-CM

## 2015-10-11 DIAGNOSIS — C61 Malignant neoplasm of prostate: Secondary | ICD-10-CM | POA: Diagnosis not present

## 2015-10-11 DIAGNOSIS — Z23 Encounter for immunization: Secondary | ICD-10-CM

## 2015-10-11 DIAGNOSIS — I2111 ST elevation (STEMI) myocardial infarction involving right coronary artery: Secondary | ICD-10-CM | POA: Diagnosis not present

## 2015-10-11 DIAGNOSIS — I10 Essential (primary) hypertension: Secondary | ICD-10-CM

## 2015-10-11 DIAGNOSIS — I2109 ST elevation (STEMI) myocardial infarction involving other coronary artery of anterior wall: Secondary | ICD-10-CM

## 2015-10-11 DIAGNOSIS — I2510 Atherosclerotic heart disease of native coronary artery without angina pectoris: Secondary | ICD-10-CM | POA: Diagnosis not present

## 2015-10-11 NOTE — Progress Notes (Signed)
Name: Robert Short   MRN: [id number]    DOB: Feb 25, 1950   Date:10/11/2015       Progress Note  Subjective  Chief Complaint  Chief Complaint  Patient presents with  . Hypertension  . Prostate Cancer  . Coronary Artery Disease    HPI Here for f/u of HBP, ASCVD with MI and prostate cancer with bone mets.  Overall he is feeling pretty well.  Pain is fairly well controlled by Oncology.  No problem-specific Assessment & Plan notes found for this encounter.   Past Medical History:  Diagnosis Date  . ASCVD (arteriosclerotic cardiovascular disease)   . Back pain   . Back pain 10/09/2012  . Bone cancer ([place])   . Depression   . History of kidney stones   . Hypertension   . Joint pain   . Prostate cancer ([place])   . SOB (shortness of breath) 10/09/2012  . Unable to ambulate 10/09/2012    Past Surgical History:  Procedure Laterality Date  . CARDIAC CATHETERIZATION     [facility]  . CARDIAC CATHETERIZATION N/A 08/16/2015   Procedure: Left Heart Cath and Coronary Angiography;  Surgeon: Yolonda Kida, MD;  Location: [facility];  Service: Cardiovascular;  Laterality: N/A;  . CARDIAC CATHETERIZATION N/A 08/16/2015   Procedure: Coronary Stent Intervention;  Surgeon: Yolonda Kida, MD;  Location: [facility];  Service: Cardiovascular;  Laterality: N/A;  . CERVIX SURGERY    . PROSTATECTOMY    . SPINE SURGERY    . TONSILLECTOMY    . WRIST SURGERY      Family History  Problem Relation Age of Onset  . Heart attack Mother   . Hypertension Mother   . Heart attack Father     Social History   Social History  . Marital status: Married    Spouse name: N/A  . Number of children: N/A  . Years of education: N/A   Occupational History  . Not on file.   Social History Main Topics  . Smoking status: Former Smoker    Packs/day: 1.00    Years: 40.00    Types: Cigarettes  . Smokeless tobacco: Never Used     Comment: "I use a pack a day in 24 hours. I have quit and  started in past."  . Alcohol use No  . Drug use: No  . Sexual activity: Not on file   Other Topics Concern  . Not on file   Social History Narrative  . No narrative on file     Current Outpatient Prescriptions:  .  acetaminophen (TYLENOL) 500 MG tablet, Take 1,500 mg by mouth every 8 (eight) hours as needed. , Disp: , Rfl:  .  aspirin 81 MG tablet, Take 81 mg by mouth daily., Disp: , Rfl:  .  atorvastatin (LIPITOR) 80 MG tablet, Take 1 tablet (80 mg total) by mouth daily at 6 PM., Disp: 30 tablet, Rfl: 12 .  baclofen (LIORESAL) 20 MG tablet, TAKE 1 TABLET BY MOUTH TWICE DAILY, Disp: 60 tablet, Rfl: 12 .  losartan (COZAAR) 50 MG tablet, Take 1 table once daily, Disp: 30 tablet, Rfl: 12 .  metoprolol (LOPRESSOR) 50 MG tablet, Take 50 mg by mouth 2 (two) times daily., Disp: , Rfl:  .  oxyCODONE (OXYCONTIN) 40 mg 12 hr tablet, Take 1 tablet (40 mg total) by mouth every 12 (twelve) hours., Disp: 60 tablet, Rfl: 0 .  Oxycodone HCl 10 MG TABS, One pill every 6- 8 hours  as needed for pain, Disp: 120 tablet, Rfl: 0 .  ticagrelor (BRILINTA) 90 MG TABS tablet, Take 1 tablet (90 mg total) by mouth 2 (two) times daily., Disp: 60 tablet, Rfl: 12 .  Wheat Dextrin (BENEFIBER) POWD, Stir 2 tsp. TID into 4-8 oz of any non-carbonated beverage or soft food (hot or cold), Disp: 500 g, Rfl: PRN .  abiraterone Acetate (ZYTIGA) 250 MG tablet, Take 4 tablets (1,000 mg total) by mouth daily. Take on an empty stomach 1 hour before or 2 hours after a meal (Patient not taking: Reported on 10/11/2015), Disp: 120 tablet, Rfl: 0  No Known Allergies   Review of Systems  Constitutional: Negative for chills, fever, malaise/fatigue and weight loss.  HENT: Negative for hearing loss.   Eyes: Negative for blurred vision and double vision.  Respiratory: Negative for cough, shortness of breath and wheezing.   Cardiovascular: Negative for chest pain, palpitations and leg swelling.  Gastrointestinal: Negative for blood in  stool, constipation and heartburn.  Genitourinary: Negative for dysuria, frequency and urgency.  Musculoskeletal: Positive for joint pain and myalgias.       Diffuse bone pain  Skin: Negative for rash.  Neurological: Negative for dizziness, tremors, weakness and headaches.      Objective  Vitals:   10/11/15 1400 10/11/15 1420  BP: 117/77 125/75  Pulse: 88   Resp: 16   Temp: 98.9 F (37.2 C)   TempSrc: Oral   Weight: 197 lb (89.4 kg)   Height: '5\' 11"'  (1.803 m)     Physical Exam  Constitutional: He is oriented to person, place, and time and well-developed, well-nourished, and in no distress. No distress.  HENT:  Head: Normocephalic and atraumatic.  Eyes: Conjunctivae and EOM are normal. Pupils are equal, round, and reactive to light. No scleral icterus.  Neck: Normal range of motion. Neck supple. Carotid bruit is not present. No thyromegaly present.  Cardiovascular: Normal rate, regular rhythm and normal heart sounds.  Exam reveals no gallop and no friction rub.   No murmur heard. Pulmonary/Chest: Effort normal and breath sounds normal. No respiratory distress. He has no wheezes. He has no rales.  Musculoskeletal: He exhibits no edema.  Lymphadenopathy:    He has no cervical adenopathy.  Neurological: He is alert and oriented to person, place, and time.  Vitals reviewed.      Recent Results (from the past 2160 hour(s))  CBC with Differential     Status: Abnormal   Collection Time: 07/14/15  1:25 PM  Result Value Ref Range   WBC 3.2 (L) 3.8 - 10.6 K/uL   RBC 4.12 (L) 4.40 - 5.90 MIL/uL   Hemoglobin 13.0 13.0 - 18.0 g/dL   HCT 37.3 (L) 40.0 - 52.0 %   MCV 90.6 80.0 - 100.0 fL   MCH 31.6 26.0 - 34.0 pg   MCHC 34.9 32.0 - 36.0 g/dL   RDW 14.6 (H) 11.5 - 14.5 %   Platelets 123 (L) 150 - 440 K/uL   Neutrophils Relative % 69 %   Neutro Abs 2.2 1.4 - 6.5 K/uL   Lymphocytes Relative 19 %   Lymphs Abs 0.6 (L) 1.0 - 3.6 K/uL   Monocytes Relative 11 %   Monocytes  Absolute 0.4 0.2 - 1.0 K/uL   Eosinophils Relative 1 %   Eosinophils Absolute 0.0 0 - 0.7 K/uL   Basophils Relative 0 %   Basophils Absolute 0.0 0 - 0.1 K/uL  Comprehensive metabolic panel     Status: Abnormal  Collection Time: 07/14/15  1:25 PM  Result Value Ref Range   Sodium 137 135 - 145 mmol/L   Potassium 4.0 3.5 - 5.1 mmol/L   Chloride 107 101 - 111 mmol/L   CO2 25 22 - 32 mmol/L   Glucose, Bld 139 (H) 65 - 99 mg/dL   BUN 11 6 - 20 mg/dL   Creatinine, Ser 0.65 0.61 - 1.24 mg/dL   Calcium 9.1 8.9 - 10.3 mg/dL   Total Protein 6.5 6.5 - 8.1 g/dL   Albumin 3.8 3.5 - 5.0 g/dL   AST 21 15 - 41 U/L   ALT 22 17 - 63 U/L   Alkaline Phosphatase 38 38 - 126 U/L   Total Bilirubin 0.5 0.3 - 1.2 mg/dL   GFR calc non Af Amer >60 >60 mL/min   GFR calc Af Amer >60 >60 mL/min    Comment: (NOTE) The eGFR has been calculated using the CKD EPI equation. This calculation has not been validated in all clinical situations. eGFR's persistently <60 mL/min signify possible Chronic Kidney Disease.    Anion gap 5 5 - 15  PSA     Status: Abnormal   Collection Time: 07/14/15  1:25 PM  Result Value Ref Range   PSA 14.08 (H) 0.00 - 4.00 ng/mL    Comment: (NOTE) While PSA levels of <=4.0 ng/ml are reported as reference range, some men with levels below 4.0 ng/ml can have prostate cancer and many men with PSA above 4.0 ng/ml do not have prostate cancer.  Other tests such as free PSA, age specific reference ranges, PSA velocity and PSA doubling time may be helpful especially in men less than 80 years old. Performed at [facility]   Testosterone     Status: Abnormal   Collection Time: 07/14/15  1:25 PM  Result Value Ref Range   Testosterone <3 (L) 348 - 1,197 ng/dL    Comment: (NOTE) **Effective July 19, 2015 the reference interval for**  Testosterone, Serum Males >70 years old will be  changing to:           Adult Males:      72 - 916    Comment, Testosterone Comment     Comment:  (NOTE) Adult male reference interval is based on a population of lean males up to 64 years old. Performed At: [facility] [place], [place] [id number] Lindon Romp MD HC:[id number]   CBC with Differential     Status: Abnormal   Collection Time: 08/10/15  2:03 PM  Result Value Ref Range   WBC 3.1 (L) 3.8 - 10.6 K/uL   RBC 4.17 (L) 4.40 - 5.90 MIL/uL   Hemoglobin 13.2 13.0 - 18.0 g/dL   HCT 38.7 (L) 40.0 - 52.0 %   MCV 92.7 80.0 - 100.0 fL   MCH 31.7 26.0 - 34.0 pg   MCHC 34.2 32.0 - 36.0 g/dL   RDW 14.5 11.5 - 14.5 %   Platelets 120 (L) 150 - 440 K/uL   Neutrophils Relative % 63 %   Neutro Abs 2.0 1.4 - 6.5 K/uL   Lymphocytes Relative 24 %   Lymphs Abs 0.8 (L) 1.0 - 3.6 K/uL   Monocytes Relative 10 %   Monocytes Absolute 0.3 0.2 - 1.0 K/uL   Eosinophils Relative 2 %   Eosinophils Absolute 0.1 0 - 0.7 K/uL   Basophils Relative 1 %   Basophils Absolute 0.0 0 - 0.1 K/uL  Comprehensive metabolic panel  Status: Abnormal   Collection Time: 08/10/15  2:03 PM  Result Value Ref Range   Sodium 137 135 - 145 mmol/L   Potassium 3.9 3.5 - 5.1 mmol/L   Chloride 103 101 - 111 mmol/L   CO2 28 22 - 32 mmol/L   Glucose, Bld 166 (H) 65 - 99 mg/dL   BUN 11 6 - 20 mg/dL   Creatinine, Ser 0.88 0.61 - 1.24 mg/dL   Calcium 9.6 8.9 - 10.3 mg/dL   Total Protein 6.6 6.5 - 8.1 g/dL   Albumin 4.1 3.5 - 5.0 g/dL   AST 17 15 - 41 U/L   ALT 18 17 - 63 U/L   Alkaline Phosphatase 32 (L) 38 - 126 U/L   Total Bilirubin 0.4 0.3 - 1.2 mg/dL   GFR calc non Af Amer >60 >60 mL/min   GFR calc Af Amer >60 >60 mL/min    Comment: (NOTE) The eGFR has been calculated using the CKD EPI equation. This calculation has not been validated in all clinical situations. eGFR's persistently <60 mL/min signify possible Chronic Kidney Disease.    Anion gap 6 5 - 15  PSA     Status: Abnormal   Collection Time: 08/10/15  2:03 PM  Result Value Ref Range   PSA 15.26 (H) 0.00 - 4.00 ng/mL     Comment: (NOTE) While PSA levels of <=4.0 ng/ml are reported as reference range, some men with levels below 4.0 ng/ml can have prostate cancer and many men with PSA above 4.0 ng/ml do not have prostate cancer.  Other tests such as free PSA, age specific reference ranges, PSA velocity and PSA doubling time may be helpful especially in men less than 57 years old. Performed at [facility]   CBC     Status: Abnormal   Collection Time: 08/16/15  4:26 PM  Result Value Ref Range   WBC 5.6 3.8 - 10.6 K/uL   RBC 4.10 (L) 4.40 - 5.90 MIL/uL   Hemoglobin 13.3 13.0 - 18.0 g/dL   HCT 38.5 (L) 40.0 - 52.0 %   MCV 93.9 80.0 - 100.0 fL   MCH 32.5 26.0 - 34.0 pg   MCHC 34.6 32.0 - 36.0 g/dL   RDW 14.6 (H) 11.5 - 14.5 %   Platelets 146 (L) 150 - 440 K/uL  Differential     Status: Abnormal   Collection Time: 08/16/15  4:26 PM  Result Value Ref Range   Neutrophils Relative % 78 %   Neutro Abs 4.4 1.4 - 6.5 K/uL   Lymphocytes Relative 11 %   Lymphs Abs 0.6 (L) 1.0 - 3.6 K/uL   Monocytes Relative 10 %   Monocytes Absolute 0.6 0.2 - 1.0 K/uL   Eosinophils Relative 1 %   Eosinophils Absolute 0.0 0 - 0.7 K/uL   Basophils Relative 0 %   Basophils Absolute 0.0 0 - 0.1 K/uL  Protime-INR     Status: None   Collection Time: 08/16/15  4:26 PM  Result Value Ref Range   Prothrombin Time 12.6 11.4 - 15.2 seconds   INR 0.94   APTT     Status: None   Collection Time: 08/16/15  4:26 PM  Result Value Ref Range   aPTT 33 24 - 36 seconds  Comprehensive metabolic panel     Status: Abnormal   Collection Time: 08/16/15  4:26 PM  Result Value Ref Range   Sodium 139 135 - 145 mmol/L   Potassium 3.6 3.5 - 5.1 mmol/L  Chloride 105 101 - 111 mmol/L   CO2 24 22 - 32 mmol/L   Glucose, Bld 171 (H) 65 - 99 mg/dL   BUN 12 6 - 20 mg/dL   Creatinine, Ser 0.82 0.61 - 1.24 mg/dL   Calcium 9.7 8.9 - 10.3 mg/dL   Total Protein 6.7 6.5 - 8.1 g/dL   Albumin 4.3 3.5 - 5.0 g/dL   AST 19 15 - 41 U/L   ALT 15 (L)  17 - 63 U/L   Alkaline Phosphatase 35 (L) 38 - 126 U/L   Total Bilirubin 0.6 0.3 - 1.2 mg/dL   GFR calc non Af Amer >60 >60 mL/min   GFR calc Af Amer >60 >60 mL/min    Comment: (NOTE) The eGFR has been calculated using the CKD EPI equation. This calculation has not been validated in all clinical situations. eGFR's persistently <60 mL/min signify possible Chronic Kidney Disease.    Anion gap 10 5 - 15  Troponin I     Status: Abnormal   Collection Time: 08/16/15  4:26 PM  Result Value Ref Range   Troponin I 0.03 (HH) <0.03 ng/mL    Comment: CRITICAL RESULT CALLED TO, READ BACK BY AND VERIFIED WITH HAL HERSHENSON AT 1710 08/16/2015 BY TFK.   Lipid panel     Status: Abnormal   Collection Time: 08/16/15  4:26 PM  Result Value Ref Range   Cholesterol 226 (H) 0 - 200 mg/dL   Triglycerides 199 (H) <150 mg/dL   HDL 45 >40 mg/dL   Total CHOL/HDL Ratio 5.0 RATIO   VLDL 40 0 - 40 mg/dL   LDL Cholesterol 141 (H) 0 - 99 mg/dL    Comment:        Total Cholesterol/HDL:CHD Risk Coronary Heart Disease Risk Table                     Men   Women  1/2 Average Risk   3.4   3.3  Average Risk       5.0   4.4  2 X Average Risk   9.6   7.1  3 X Average Risk  23.4   11.0        Use the calculated Patient Ratio above and the CHD Risk Table to determine the patient's CHD Risk.        ATP III CLASSIFICATION (LDL):  <100     mg/dL   Optimal  100-129  mg/dL   Near or Above                    Optimal  130-159  mg/dL   Borderline  160-189  mg/dL   High  >190     mg/dL   Very High   Cardiac catheterization     Status: None   Collection Time: 08/16/15  5:06 PM  Result Value Ref Range   Cath EF Quantitative 50 %  MRSA PCR Screening     Status: None   Collection Time: 08/16/15  6:33 PM  Result Value Ref Range   MRSA by PCR NEGATIVE NEGATIVE    Comment:        The GeneXpert MRSA Assay (FDA approved for NASAL specimens only), is one component of a comprehensive MRSA colonization surveillance  program. It is not intended to diagnose MRSA infection nor to guide or monitor treatment for MRSA infections.   Glucose, capillary     Status: Abnormal   Collection Time: 08/16/15  6:33 PM  Result Value Ref Range   Glucose-Capillary 130 (H) 65 - 99 mg/dL  Basic metabolic panel     Status: None   Collection Time: 08/17/15  4:16 AM  Result Value Ref Range   Sodium 142 135 - 145 mmol/L   Potassium 3.7 3.5 - 5.1 mmol/L   Chloride 110 101 - 111 mmol/L   CO2 26 22 - 32 mmol/L   Glucose, Bld 77 65 - 99 mg/dL   BUN 10 6 - 20 mg/dL   Creatinine, Ser 0.69 0.61 - 1.24 mg/dL   Calcium 9.2 8.9 - 10.3 mg/dL   GFR calc non Af Amer >60 >60 mL/min   GFR calc Af Amer >60 >60 mL/min    Comment: (NOTE) The eGFR has been calculated using the CKD EPI equation. This calculation has not been validated in all clinical situations. eGFR's persistently <60 mL/min signify possible Chronic Kidney Disease.    Anion gap 6 5 - 15  CBC     Status: Abnormal   Collection Time: 08/17/15  4:16 AM  Result Value Ref Range   WBC 5.0 3.8 - 10.6 K/uL   RBC 4.07 (L) 4.40 - 5.90 MIL/uL   Hemoglobin 13.2 13.0 - 18.0 g/dL   HCT 38.1 (L) 40.0 - 52.0 %   MCV 93.7 80.0 - 100.0 fL   MCH 32.5 26.0 - 34.0 pg   MCHC 34.7 32.0 - 36.0 g/dL   RDW 14.6 (H) 11.5 - 14.5 %   Platelets 141 (L) 150 - 440 K/uL  ECHOCARDIOGRAM COMPLETE     Status: None   Collection Time: 08/17/15  3:43 PM  Result Value Ref Range   Weight 3,174.62 oz   Height 71 in   BP 100/57 mmHg  Troponin I (q 6hr x 3)     Status: Abnormal   Collection Time: 08/17/15  8:52 PM  Result Value Ref Range   Troponin I 0.30 (HH) <0.03 ng/mL    Comment: CRITICAL VALUE NOTED. VALUE IS CONSISTENT WITH PREVIOUSLY REPORTED/CALLED VALUE TLB   CBC with Differential     Status: Abnormal   Collection Time: 08/18/15  5:10 PM  Result Value Ref Range   WBC 5.9 3.8 - 10.6 K/uL   RBC 4.08 (L) 4.40 - 5.90 MIL/uL   Hemoglobin 13.2 13.0 - 18.0 g/dL   HCT 38.8 (L) 40.0 - 52.0  %   MCV 94.9 80.0 - 100.0 fL   MCH 32.3 26.0 - 34.0 pg   MCHC 34.0 32.0 - 36.0 g/dL   RDW 14.7 (H) 11.5 - 14.5 %   Platelets 143 (L) 150 - 440 K/uL   Neutrophils Relative % 69 %   Neutro Abs 4.1 1.4 - 6.5 K/uL   Lymphocytes Relative 15 %   Lymphs Abs 0.9 (L) 1.0 - 3.6 K/uL   Monocytes Relative 14 %   Monocytes Absolute 0.8 0.2 - 1.0 K/uL   Eosinophils Relative 1 %   Eosinophils Absolute 0.1 0 - 0.7 K/uL   Basophils Relative 1 %   Basophils Absolute 0.0 0 - 0.1 K/uL  CBC with Differential     Status: Abnormal   Collection Time: 09/15/15  2:25 PM  Result Value Ref Range   WBC 4.0 3.8 - 10.6 K/uL   RBC 3.79 (L) 4.40 - 5.90 MIL/uL   Hemoglobin 12.2 (L) 13.0 - 18.0 g/dL   HCT 35.8 (L) 40.0 - 52.0 %   MCV 94.4 80.0 - 100.0 fL   MCH 32.3 26.0 - 34.0 pg  MCHC 34.2 32.0 - 36.0 g/dL   RDW 14.3 11.5 - 14.5 %   Platelets 138 (L) 150 - 440 K/uL   Neutrophils Relative % 71 %   Neutro Abs 2.9 1.4 - 6.5 K/uL   Lymphocytes Relative 17 %   Lymphs Abs 0.7 (L) 1.0 - 3.6 K/uL   Monocytes Relative 10 %   Monocytes Absolute 0.4 0.2 - 1.0 K/uL   Eosinophils Relative 1 %   Eosinophils Absolute 0.1 0 - 0.7 K/uL   Basophils Relative 1 %   Basophils Absolute 0.0 0 - 0.1 K/uL  CBC with Differential     Status: Abnormal   Collection Time: 10/05/15  1:44 PM  Result Value Ref Range   WBC 2.6 (L) 3.8 - 10.6 K/uL   RBC 3.71 (L) 4.40 - 5.90 MIL/uL   Hemoglobin 12.1 (L) 13.0 - 18.0 g/dL   HCT 35.6 (L) 40.0 - 52.0 %   MCV 95.9 80.0 - 100.0 fL   MCH 32.6 26.0 - 34.0 pg   MCHC 33.9 32.0 - 36.0 g/dL   RDW 14.0 11.5 - 14.5 %   Platelets 139 (L) 150 - 440 K/uL   Neutrophils Relative % 62 %   Neutro Abs 1.6 1.4 - 6.5 K/uL   Lymphocytes Relative 20 %   Lymphs Abs 0.5 (L) 1.0 - 3.6 K/uL   Monocytes Relative 14 %   Monocytes Absolute 0.4 0.2 - 1.0 K/uL   Eosinophils Relative 3 %   Eosinophils Absolute 0.1 0 - 0.7 K/uL   Basophils Relative 1 %   Basophils Absolute 0.0 0 - 0.1 K/uL  Comprehensive  metabolic panel     Status: Abnormal   Collection Time: 10/05/15  1:44 PM  Result Value Ref Range   Sodium 140 135 - 145 mmol/L   Potassium 4.0 3.5 - 5.1 mmol/L   Chloride 106 101 - 111 mmol/L   CO2 26 22 - 32 mmol/L   Glucose, Bld 137 (H) 65 - 99 mg/dL   BUN 11 6 - 20 mg/dL   Creatinine, Ser 0.78 0.61 - 1.24 mg/dL   Calcium 9.2 8.9 - 10.3 mg/dL   Total Protein 6.5 6.5 - 8.1 g/dL   Albumin 4.1 3.5 - 5.0 g/dL   AST 20 15 - 41 U/L   ALT 17 17 - 63 U/L   Alkaline Phosphatase 41 38 - 126 U/L   Total Bilirubin 0.3 0.3 - 1.2 mg/dL   GFR calc non Af Amer >60 >60 mL/min   GFR calc Af Amer >60 >60 mL/min    Comment: (NOTE) The eGFR has been calculated using the CKD EPI equation. This calculation has not been validated in all clinical situations. eGFR's persistently <60 mL/min signify possible Chronic Kidney Disease.    Anion gap 8 5 - 15  PSA     Status: None   Collection Time: 10/05/15  1:44 PM  Result Value Ref Range   PSA 1.40 0.00 - 4.00 ng/mL    Comment: Performed at [facility]  Problem List Items Addressed This Visit      Cardiovascular and Mediastinum   CAD (coronary artery disease)   Relevant Orders   Ambulatory referral to Cardiology   Essential (primary) hypertension - Primary   Acute ST elevation myocardial infarction (STEMI) involving right coronary artery without development of Q waves (HCC)     Genitourinary   Prostate cancer (Chronic)     Other   Chronic  pain (Chronic)    Other Visit Diagnoses    Immunization due       Relevant Orders   Flu Vaccine QUAD 36+ mos PF IM (Fluarix & Fluzone Quad PF)      No orders of the defined types were placed in this encounter.  1. Essential (primary) hypertension Cont Losartan  2. Coronary artery disease involving native coronary artery of native heart without angina pectoris cont Metoprolol Cont Brilenta - Ambulatory referral to Cardiology  3. Acute ST elevation myocardial  infarction (STEMI) involving right coronary artery without development of Q waves ([place])   4. Prostate cancer Cont Oncology  5. Chronic pain due to neoplasm   6. Immunization due  - Flu Vaccine QUAD 36+ mos PF IM (Fluarix & Fluzone Quad PF)

## 2015-10-12 ENCOUNTER — Inpatient Hospital Stay (HOSPITAL_BASED_OUTPATIENT_CLINIC_OR_DEPARTMENT_OTHER): Payer: 59 | Admitting: Internal Medicine

## 2015-10-12 ENCOUNTER — Encounter: Payer: Self-pay | Admitting: Internal Medicine

## 2015-10-12 VITALS — BP 122/75 | HR 66 | Temp 96.5°F | Resp 16 | Ht 71.0 in | Wt 198.7 lb

## 2015-10-12 DIAGNOSIS — F17210 Nicotine dependence, cigarettes, uncomplicated: Secondary | ICD-10-CM | POA: Diagnosis not present

## 2015-10-12 DIAGNOSIS — Z87442 Personal history of urinary calculi: Secondary | ICD-10-CM | POA: Diagnosis not present

## 2015-10-12 DIAGNOSIS — F329 Major depressive disorder, single episode, unspecified: Secondary | ICD-10-CM | POA: Diagnosis not present

## 2015-10-12 DIAGNOSIS — C61 Malignant neoplasm of prostate: Secondary | ICD-10-CM | POA: Diagnosis not present

## 2015-10-12 DIAGNOSIS — I10 Essential (primary) hypertension: Secondary | ICD-10-CM | POA: Diagnosis not present

## 2015-10-12 DIAGNOSIS — Z79899 Other long term (current) drug therapy: Secondary | ICD-10-CM | POA: Diagnosis not present

## 2015-10-12 DIAGNOSIS — C7951 Secondary malignant neoplasm of bone: Secondary | ICD-10-CM

## 2015-10-12 DIAGNOSIS — I2510 Atherosclerotic heart disease of native coronary artery without angina pectoris: Secondary | ICD-10-CM | POA: Diagnosis not present

## 2015-10-12 NOTE — Assessment & Plan Note (Addendum)
Castrate resistant prostate cancer-  Patient currently on Lupron androgen deprivation therapy/ on Xofigo radiation oncology; # 6/6 last week ago. Also on Zytiga- since Aug 2017.   # continue Zytiga 4 pill/ with predinsone BID. PSA 1.4. Again discussed with the patient that treatments are palliative/incurable. And are indefinite treatments. Tolerating fairly well.   # X-geva q 3 M; on ca+vit D. No AEs noted.   #   Back pain secondary malignancy status post radiation- stable/ on oxycontin 40 mg BID; oxycodone 10 mg every 8-12 hours.  Will call in for refills.   # Non-STEMI status post stenting/ significant improvement of symptoms- no recurrent events.   # follow up in 6 weeks/ X-geva/Lupron/labs.

## 2015-10-12 NOTE — Progress Notes (Signed)
5. He  Dr. Tollie Pizza was 100 beats [place] OFFICE PROGRESS NOTE  Patient Care Team: Arlis Porta., MD as PCP - General (Family Medicine)   SUMMARY OF ONCOLOGIC HISTORY: Oncology History   # 2011- PROSTATE CANCER [Gleason 3+4]; s/p Prostatectomy [ also involved bladder neck/ECP; Dr.Polaseck]; July 2014- Biochemical recurrence [PSA 14]- started on Zoladex [Dr.Pandit]; lost to follow up.  # JAN 2017- STAGE IV METASTATIC PROSTATE Cancer to Bone- Feb 13th, 2017-  Lupron q 19m [~end of feb]; PSA: 1021; Declined Chemo; April 2017 [xofigo; Dr.Crystal]  # Mets to bone- start X-geva q 21M [May 30th]  # Smoker/ chronic pain/pain clinic      Malignant neoplasm of prostate ([place])   08/01/2010 Initial Diagnosis    Malignant neoplasm of prostate [facility])       Prostate cancer metastatic to bone ([place])   12/08/2014 Initial Diagnosis    Prostate cancer metastatic to bone [facility])       INTERVAL HISTORY:   [name]  With above history of prostate cancer metastatic castrate resistant currently on Lupron + Zytiga is here for follow-up. Patient is s/p Xofigo s/p post 6 treatments.  Patient complains of mild nausea while taking the pills; otherwise no swelling in the legs no shortness of breath or chest pain. No weight gain. He continues to take narcotic pain medication.    Appetite is fair. Weight is fairly stable . Hot flashes improved. No abdominal pain. No blood in stools. No new shortness of breath or chest pain or cough.   REVIEW OF SYSTEMS:  A complete 10 point review of system is done which is negative except mentioned above/history of present illness.   PAST MEDICAL HISTORY :  Past Medical History:  Diagnosis Date  . ASCVD (arteriosclerotic cardiovascular disease)   . Back pain   . Back pain 10/09/2012  . Bone cancer ([place])   . Depression   . History of kidney stones   . Hypertension   . Joint pain   . Prostate cancer ([place])   . SOB  (shortness of breath) 10/09/2012  . Unable to ambulate 10/09/2012    PAST SURGICAL HISTORY :   Past Surgical History:  Procedure Laterality Date  . CARDIAC CATHETERIZATION     [facility]  . CARDIAC CATHETERIZATION N/A 08/16/2015   Procedure: Left Heart Cath and Coronary Angiography;  Surgeon: Yolonda Kida, MD;  Location: [place] CV LAB;  Service: Cardiovascular;  Laterality: N/A;  . CARDIAC CATHETERIZATION N/A 08/16/2015   Procedure: Coronary Stent Intervention;  Surgeon: Yolonda Kida, MD;  Location: [place] CV LAB;  Service: Cardiovascular;  Laterality: N/A;  . CERVIX SURGERY    . PROSTATECTOMY    . SPINE SURGERY    . TONSILLECTOMY    . WRIST SURGERY      FAMILY HISTORY :   Family History  Problem Relation Age of Onset  . Heart attack Mother   . Hypertension Mother   . Heart attack Father     SOCIAL HISTORY:   Social History  Substance Use Topics  . Smoking status: Former Smoker    Packs/day: 1.00    Years: 40.00    Types: Cigarettes  . Smokeless tobacco: Never Used     Comment: "I use a pack a day in 24 hours. I have quit and started in past."  . Alcohol use No    ALLERGIES:  has No Known Allergies.  MEDICATIONS:  Current Outpatient Prescriptions  Medication Sig Dispense Refill  . abiraterone Acetate (ZYTIGA) 250 MG tablet Take 4 tablets (1,000 mg total) by mouth daily. Take on an empty stomach 1 hour before or 2 hours after a meal 120 tablet 0  . acetaminophen (TYLENOL) 500 MG tablet Take 1,500 mg by mouth every 8 (eight) hours as needed.     Marland Kitchen aspirin 81 MG tablet Take 81 mg by mouth daily.    Marland Kitchen atorvastatin (LIPITOR) 80 MG tablet Take 1 tablet (80 mg total) by mouth daily at 6 PM. 30 tablet 12  . baclofen (LIORESAL) 20 MG tablet TAKE 1 TABLET BY MOUTH TWICE DAILY 60 tablet 12  . losartan (COZAAR) 50 MG tablet Take 1 table once daily 30 tablet 12  . metoprolol (LOPRESSOR) 50 MG tablet Take 50 mg by mouth 2 (two) times daily.    Marland Kitchen oxyCODONE  (OXYCONTIN) 40 mg 12 hr tablet Take 1 tablet (40 mg total) by mouth every 12 (twelve) hours. 60 tablet 0  . Oxycodone HCl 10 MG TABS One pill every 6- 8 hours as needed for pain 120 tablet 0  . ticagrelor (BRILINTA) 90 MG TABS tablet Take 1 tablet (90 mg total) by mouth 2 (two) times daily. 60 tablet 12  . Wheat Dextrin (BENEFIBER) POWD Stir 2 tsp. TID into 4-8 oz of any non-carbonated beverage or soft food (hot or cold) 500 g PRN   No current facility-administered medications for this visit.     PHYSICAL EXAMINATION: ECOG PERFORMANCE STATUS: 1 - Symptomatic but completely ambulatory  BP 122/75 (BP Location: Left Arm, Patient Position: Sitting)   Pulse 66   Temp (!) 96.5 F (35.8 C) (Tympanic)   Resp 16   Ht 5\' 11"  (1.803 m)   Wt 198 lb 11.9 oz (90.2 kg)   BMI 27.72 kg/m   Filed Weights   10/12/15 1348  Weight: 198 lb 11.9 oz (90.2 kg)   GENERAL: Well-nourished well-developed; Alert, no distress and comfortable. Accompanied by his wife. EYES: no pallor or icterus OROPHARYNX: no thrush or ulceration; Upper dentures. NECK: supple, no masses felt LYMPH: no palpable lymphadenopathy in the cervical, axillary or inguinal regions LUNGS: clear to auscultation and No wheeze or crackles HEART/CVS: regular rate & rhythm and no murmurs; No lower extremity edema ABDOMEN:abdomen soft, non-tender and normal bowel sounds; No hepatomegaly. Musculoskeletal:no cyanosis of digits and no clubbing; No significant tenderness noted. PSYCH: alert & oriented x 3 with fluent speech NEURO: no focal motor/sensory deficits SKIN: no rashes or significant lesions   LABORATORY DATA:  I have reviewed the data as listed    Component Value Date/Time   NA 140 10/05/2015 1344   NA 140 07/13/2013 1542   K 4.0 10/05/2015 1344   K 4.3 07/13/2013 1542   CL 106 10/05/2015 1344   CL 107 07/13/2013 1542   CO2 26 10/05/2015 1344   CO2 26 07/13/2013 1542   GLUCOSE 137 (H) 10/05/2015 1344   GLUCOSE 106  (H) 07/13/2013 1542   BUN 11 10/05/2015 1344   BUN 11 07/13/2013 1542   CREATININE 0.78 10/05/2015 1344   CREATININE 1.01 07/13/2013 1542   CALCIUM 9.2 10/05/2015 1344   CALCIUM 9.9 07/13/2013 1542   PROT 6.5 10/05/2015 1344   PROT 8.1 07/13/2013 1542   ALBUMIN 4.1 10/05/2015 1344   ALBUMIN 4.3 07/13/2013 1542   AST 20 10/05/2015 1344   AST 26 07/13/2013 1542   ALT 17 10/05/2015 1344   ALT 32 07/13/2013 1542   ALKPHOS 41 10/05/2015  1344   ALKPHOS 74 07/13/2013 1542   BILITOT 0.3 10/05/2015 1344   BILITOT 0.4 07/13/2013 1542   GFRNONAA >60 10/05/2015 1344   GFRNONAA >60 07/13/2013 1542   GFRAA >60 10/05/2015 1344   GFRAA >60 07/13/2013 1542    No results found for: SPEP, UPEP  Lab Results  Component Value Date   WBC 2.6 (L) 10/05/2015   NEUTROABS 1.6 10/05/2015   HGB 12.1 (L) 10/05/2015   HCT 35.6 (L) 10/05/2015   MCV 95.9 10/05/2015   PLT 139 (L) 10/05/2015      Chemistry      Component Value Date/Time   NA 140 10/05/2015 1344   NA 140 07/13/2013 1542   K 4.0 10/05/2015 1344   K 4.3 07/13/2013 1542   CL 106 10/05/2015 1344   CL 107 07/13/2013 1542   CO2 26 10/05/2015 1344   CO2 26 07/13/2013 1542   BUN 11 10/05/2015 1344   BUN 11 07/13/2013 1542   CREATININE 0.78 10/05/2015 1344   CREATININE 1.01 07/13/2013 1542      Component Value Date/Time   CALCIUM 9.2 10/05/2015 1344   CALCIUM 9.9 07/13/2013 1542   ALKPHOS 41 10/05/2015 1344   ALKPHOS 74 07/13/2013 1542   AST 20 10/05/2015 1344   AST 26 07/13/2013 1542   ALT 17 10/05/2015 1344   ALT 32 07/13/2013 1542   BILITOT 0.3 10/05/2015 1344   BILITOT 0.4 07/13/2013 1542        ASSESSMENT & PLAN:    No problem-specific Assessment & Plan notes found for this encounter.      [name], MD 10/12/2015 2:00 PM

## 2015-10-14 ENCOUNTER — Other Ambulatory Visit: Payer: Self-pay

## 2015-10-14 DIAGNOSIS — C7951 Secondary malignant neoplasm of bone: Principal | ICD-10-CM

## 2015-10-14 DIAGNOSIS — C61 Malignant neoplasm of prostate: Secondary | ICD-10-CM

## 2015-10-19 ENCOUNTER — Encounter: Payer: Self-pay | Admitting: Internal Medicine

## 2015-10-21 ENCOUNTER — Telehealth: Payer: Self-pay | Admitting: *Deleted

## 2015-10-21 ENCOUNTER — Other Ambulatory Visit: Payer: Self-pay | Admitting: *Deleted

## 2015-10-21 DIAGNOSIS — G893 Neoplasm related pain (acute) (chronic): Secondary | ICD-10-CM

## 2015-10-21 DIAGNOSIS — C7951 Secondary malignant neoplasm of bone: Principal | ICD-10-CM

## 2015-10-21 DIAGNOSIS — C61 Malignant neoplasm of prostate: Secondary | ICD-10-CM

## 2015-10-21 MED ORDER — OXYCODONE HCL ER 40 MG PO T12A: 40.0000 mg | EXTENDED_RELEASE_TABLET | Freq: Two times a day (BID) | ORAL | 0 refills | Status: DC

## 2015-10-21 MED ORDER — OXYCODONE HCL 10 MG PO TABS: ORAL_TABLET | ORAL | 0 refills | Status: DC

## 2015-10-21 NOTE — Telephone Encounter (Signed)
Duplicate encounter

## 2015-11-03 ENCOUNTER — Ambulatory Visit: Payer: 59 | Admitting: Radiation Oncology

## 2015-11-08 ENCOUNTER — Ambulatory Visit: Payer: 59 | Admitting: Radiation Oncology

## 2015-11-09 ENCOUNTER — Encounter: Payer: Self-pay | Admitting: Cardiovascular Disease

## 2015-11-09 ENCOUNTER — Ambulatory Visit (INDEPENDENT_AMBULATORY_CARE_PROVIDER_SITE_OTHER): Payer: 59 | Admitting: Cardiovascular Disease

## 2015-11-09 VITALS — BP 110/70 | HR 60 | Ht 71.0 in | Wt 197.5 lb

## 2015-11-09 DIAGNOSIS — E7800 Pure hypercholesterolemia, unspecified: Secondary | ICD-10-CM

## 2015-11-09 DIAGNOSIS — I2510 Atherosclerotic heart disease of native coronary artery without angina pectoris: Secondary | ICD-10-CM | POA: Diagnosis not present

## 2015-11-09 DIAGNOSIS — I10 Essential (primary) hypertension: Secondary | ICD-10-CM

## 2015-11-09 DIAGNOSIS — F17200 Nicotine dependence, unspecified, uncomplicated: Secondary | ICD-10-CM

## 2015-11-09 DIAGNOSIS — I2102 ST elevation (STEMI) myocardial infarction involving left anterior descending coronary artery: Secondary | ICD-10-CM | POA: Diagnosis not present

## 2015-11-09 DIAGNOSIS — C61 Malignant neoplasm of prostate: Secondary | ICD-10-CM

## 2015-11-09 MED ORDER — ROSUVASTATIN CALCIUM 20 MG PO TABS: 20.0000 mg | ORAL_TABLET | Freq: Every day | ORAL | 3 refills | Status: DC
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
## 2015-11-09 NOTE — Patient Instructions (Addendum)
Medication Instructions:   Please stop the lipitor Start crestor 1/2 pill daily for one month Then increase up to a full pill daily as tolerated  Please cut the losartan in 1/2 daily  Wait a few weeks, If still dizzy when you stand up, Cut the metoprolol in 1/2 twice a day  Labwork:  No new labs needed  Testing/Procedures:  No further testing at this time   Follow-Up: It was a pleasure seeing you in the office today. Please call us if you have new issues that need to be addressed before your next appt.  [phone]  Your physician wants you to follow-up in: 6 months.  You will receive a reminder letter in the mail two months in advance. If you don't receive a letter, please call our office to schedule the follow-up appointment.  If you need a refill on your cardiac medications before your next appointment, please call your pharmacy.

## 2015-11-11 ENCOUNTER — Ambulatory Visit
Admission: RE | Admit: 2015-11-11 | Discharge: 2015-11-11 | Disposition: A | Discharge status: Home / Self Care | Payer: 59 | Source: Ambulatory Visit | Attending: Radiation Oncology | Admitting: Radiation Oncology

## 2015-11-11 ENCOUNTER — Encounter: Payer: Self-pay | Admitting: Radiation Oncology

## 2015-11-11 VITALS — BP 125/77 | HR 72 | Temp 97.3°F | Wt 194.3 lb

## 2015-11-11 DIAGNOSIS — Z923 Personal history of irradiation: Secondary | ICD-10-CM | POA: Insufficient documentation

## 2015-11-11 DIAGNOSIS — C61 Malignant neoplasm of prostate: Secondary | ICD-10-CM | POA: Diagnosis not present

## 2015-11-11 DIAGNOSIS — C7951 Secondary malignant neoplasm of bone: Secondary | ICD-10-CM | POA: Insufficient documentation

## 2015-11-11 NOTE — Progress Notes (Signed)
Radiation Oncology Follow up Note  Name: Robert [facility]   Date:   11/11/2015 MRN:  [id number] DOB: 1950-09-11    This 65 y.o. male presents to the clinic today for one-month follow-up status post Xofigo course of treatment.  REFERRING PROVIDER: Arlis Porta., MD  HPI: Patient is a 65 year old male with stage IV prostate cancer previously treated to his spine and bilateral hips. He is currently on. Lupron androgen deprivation therapy and Zytiga. He is now 1 month out having completed a 6 cycle course ofXofigo. He is doing fairly well. He specifically denies difficulty ambulating any significant areas of bony pain. He just generalized feels like he has fatigue and overall bone pain.   COMPLICATIONS OF TREATMENT: none  FOLLOW UP COMPLIANCE: keeps appointments   PHYSICAL EXAM:  BP 125/77   Pulse 72   Temp 97.3 F (36.3 C)   Wt 194 lb 5.4 oz (88.2 kg)   BMI 27.10 kg/m  Deep palpation of his spine does not elicit pain. Range of motion of his lower extremities does not elicit pain. Proprioception is intact. Motor sensory and DTR levels are equal and symmetric in the upper lower extremities. Well-developed well-nourished patient in NAD. HEENT reveals PERLA, EOMI, discs not visualized.  Oral cavity is clear. No oral mucosal lesions are identified. Neck is clear without evidence of cervical or supraclavicular adenopathy. Lungs are clear to A&P. Cardiac examination is essentially unremarkable with regular rate and rhythm without murmur rub or thrill. Abdomen is benign with no organomegaly or masses noted. Motor sensory and DTR levels are equal and symmetric in the upper and lower extremities. Cranial nerves II through XII are grossly intact. Proprioception is intact. No peripheral adenopathy or edema is identified. No motor or sensory levels are noted. Crude visual fields are within normal range.  RADIOLOGY RESULTS: No current films for review   PLAN: At the present time patient is doing well.  He continues under medical oncology's direction. I've asked to see him back in 4 months for follow-up. I would be happy to reevaluate the patient any time should further radiation be indicated.  I would like to take this opportunity to thank you for allowing me to participate in the care of your patient.Armstead Peaks., MD

## 2015-11-16 ENCOUNTER — Other Ambulatory Visit: Payer: Self-pay | Admitting: *Deleted

## 2015-11-16 DIAGNOSIS — G893 Neoplasm related pain (acute) (chronic): Secondary | ICD-10-CM

## 2015-11-16 DIAGNOSIS — C7951 Secondary malignant neoplasm of bone: Principal | ICD-10-CM

## 2015-11-16 DIAGNOSIS — C61 Malignant neoplasm of prostate: Secondary | ICD-10-CM

## 2015-11-16 MED ORDER — OXYCODONE HCL 10 MG PO TABS: ORAL_TABLET | ORAL | 0 refills | Status: DC

## 2015-11-16 MED ORDER — OXYCODONE HCL ER 40 MG PO T12A: 40.0000 mg | EXTENDED_RELEASE_TABLET | Freq: Two times a day (BID) | ORAL | 0 refills | Status: DC

## 2015-11-18 ENCOUNTER — Inpatient Hospital Stay: Payer: 59 | Attending: Internal Medicine

## 2015-11-18 DIAGNOSIS — I252 Old myocardial infarction: Secondary | ICD-10-CM | POA: Diagnosis not present

## 2015-11-18 DIAGNOSIS — Z87442 Personal history of urinary calculi: Secondary | ICD-10-CM | POA: Insufficient documentation

## 2015-11-18 DIAGNOSIS — C61 Malignant neoplasm of prostate: Secondary | ICD-10-CM | POA: Insufficient documentation

## 2015-11-18 DIAGNOSIS — C7951 Secondary malignant neoplasm of bone: Secondary | ICD-10-CM | POA: Insufficient documentation

## 2015-11-18 DIAGNOSIS — F329 Major depressive disorder, single episode, unspecified: Secondary | ICD-10-CM | POA: Diagnosis not present

## 2015-11-18 DIAGNOSIS — Z7982 Long term (current) use of aspirin: Secondary | ICD-10-CM | POA: Diagnosis not present

## 2015-11-18 DIAGNOSIS — Z79899 Other long term (current) drug therapy: Secondary | ICD-10-CM | POA: Insufficient documentation

## 2015-11-18 DIAGNOSIS — F17210 Nicotine dependence, cigarettes, uncomplicated: Secondary | ICD-10-CM | POA: Diagnosis not present

## 2015-11-18 DIAGNOSIS — I2510 Atherosclerotic heart disease of native coronary artery without angina pectoris: Secondary | ICD-10-CM | POA: Diagnosis not present

## 2015-11-18 DIAGNOSIS — I10 Essential (primary) hypertension: Secondary | ICD-10-CM | POA: Diagnosis not present

## 2015-11-18 DIAGNOSIS — Z923 Personal history of irradiation: Secondary | ICD-10-CM | POA: Diagnosis not present

## 2015-11-18 LAB — CBC WITH DIFFERENTIAL/PLATELET
BASOS ABS: 0 10*3/uL (ref 0–0.1)
BASOS PCT: 1 %
EOS ABS: 0.1 10*3/uL (ref 0–0.7)
EOS PCT: 2 %
HCT: 35.9 % — ABNORMAL LOW (ref 40.0–52.0)
Hemoglobin: 12.2 g/dL — ABNORMAL LOW (ref 13.0–18.0)
LYMPHS ABS: 1 10*3/uL (ref 1.0–3.6)
Lymphocytes Relative: 15 %
MCH: 32.1 pg (ref 26.0–34.0)
MCHC: 34 g/dL (ref 32.0–36.0)
MCV: 94.3 fL (ref 80.0–100.0)
Monocytes Absolute: 0.5 10*3/uL (ref 0.2–1.0)
Monocytes Relative: 9 %
Neutro Abs: 4.7 10*3/uL (ref 1.4–6.5)
Neutrophils Relative %: 73 %
PLATELETS: 149 10*3/uL — AB (ref 150–440)
RBC: 3.81 MIL/uL — AB (ref 4.40–5.90)
RDW: 13.8 % (ref 11.5–14.5)
WBC: 6.4 10*3/uL (ref 3.8–10.6)

## 2015-11-18 LAB — COMPREHENSIVE METABOLIC PANEL
ALBUMIN: 3.9 g/dL (ref 3.5–5.0)
ALT: 17 U/L (ref 17–63)
ANION GAP: 7 (ref 5–15)
AST: 17 U/L (ref 15–41)
Alkaline Phosphatase: 36 U/L — ABNORMAL LOW (ref 38–126)
BUN: 19 mg/dL (ref 6–20)
CHLORIDE: 105 mmol/L (ref 101–111)
CO2: 25 mmol/L (ref 22–32)
CREATININE: 0.84 mg/dL (ref 0.61–1.24)
Calcium: 9.2 mg/dL (ref 8.9–10.3)
GFR calc non Af Amer: >60 mL/min (ref >60)
Glucose, Bld: 110 mg/dL — ABNORMAL HIGH (ref 65–99)
Potassium: 3.8 mmol/L (ref 3.5–5.1)
SODIUM: 137 mmol/L (ref 135–145)
Total Bilirubin: 0.2 mg/dL — ABNORMAL LOW (ref 0.3–1.2)
Total Protein: 6.5 g/dL (ref 6.5–8.1)

## 2015-11-18 LAB — PSA: PSA: 3.03 ng/mL (ref 0.00–4.00)

## 2015-11-23 ENCOUNTER — Inpatient Hospital Stay: Payer: 59

## 2015-11-23 ENCOUNTER — Inpatient Hospital Stay (HOSPITAL_BASED_OUTPATIENT_CLINIC_OR_DEPARTMENT_OTHER): Payer: 59 | Admitting: Internal Medicine

## 2015-11-23 VITALS — BP 137/86 | HR 67 | Temp 97.0°F | Resp 18 | Wt 194.8 lb

## 2015-11-23 DIAGNOSIS — F329 Major depressive disorder, single episode, unspecified: Secondary | ICD-10-CM | POA: Diagnosis not present

## 2015-11-23 DIAGNOSIS — Z79899 Other long term (current) drug therapy: Secondary | ICD-10-CM | POA: Diagnosis not present

## 2015-11-23 DIAGNOSIS — C61 Malignant neoplasm of prostate: Secondary | ICD-10-CM | POA: Diagnosis not present

## 2015-11-23 DIAGNOSIS — Z923 Personal history of irradiation: Secondary | ICD-10-CM | POA: Diagnosis not present

## 2015-11-23 DIAGNOSIS — F17210 Nicotine dependence, cigarettes, uncomplicated: Secondary | ICD-10-CM | POA: Diagnosis not present

## 2015-11-23 DIAGNOSIS — C7951 Secondary malignant neoplasm of bone: Principal | ICD-10-CM

## 2015-11-23 DIAGNOSIS — I252 Old myocardial infarction: Secondary | ICD-10-CM | POA: Diagnosis not present

## 2015-11-23 DIAGNOSIS — I2510 Atherosclerotic heart disease of native coronary artery without angina pectoris: Secondary | ICD-10-CM | POA: Diagnosis not present

## 2015-11-23 DIAGNOSIS — I10 Essential (primary) hypertension: Secondary | ICD-10-CM | POA: Diagnosis not present

## 2015-11-23 MED ORDER — DENOSUMAB 120 MG/1.7ML ~~LOC~~ SOLN
120.0000 mg | Freq: Once | SUBCUTANEOUS | Status: AC
  Administered 2015-11-23: 120 mg via SUBCUTANEOUS

## 2015-11-23 MED ORDER — LEUPROLIDE ACETATE (3 MONTH) 22.5 MG IM KIT
22.5000 mg | PACK | Freq: Once | INTRAMUSCULAR | Status: AC
  Administered 2015-11-23: 22.5 mg via INTRAMUSCULAR

## 2015-11-23 NOTE — Assessment & Plan Note (Addendum)
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 No obvious clinical progression noted. However given the continued pain; recommend bone scan in 6 weeks.   # continue Zytiga 4 pill/ with predinsone 5 mg BID. PSA 3.03.    # X-geva/ Luprontoday; on ca+vit D. No AEs noted.   #   Back pain secondary malignancy status post radiation- stable/ on oxycontin 40 mg BID; oxycodone 10 mg every 8-12 hours. Await repeat bone scan to be done in about 6 weeks.  # Non-STEMI status post stenting/ significant improvement of symptoms- no recurrent events.   # follow up in bone scan weeks//labs; week later follow-up with me/ X-geva.

## 2015-11-23 NOTE — Progress Notes (Signed)
Patient is here for follow up, he has trouble sleeping and is very tired all the time.

## 2015-11-23 NOTE — Progress Notes (Signed)
Robert Short OFFICE PROGRESS NOTE  Patient Care Team: Arlis Porta., MD as PCP - General (Family Medicine) Minna Merritts, MD as Consulting Physician (Cardiology)   SUMMARY OF ONCOLOGIC HISTORY: Oncology History   # 2011- PROSTATE CANCER [Gleason 3+4]; s/p Prostatectomy [ also involved bladder neck/ECP; Dr.Polaseck]; July 2014- Biochemical recurrence [PSA 14]- started on Zoladex [Dr.Pandit]; lost to follow up.  # JAN 2017- STAGE IV METASTATIC PROSTATE Cancer to Bone- Feb 13th, 2017-  Lupron q 39m [~end of feb]; PSA: 1021; Declined Chemo; April 2017 [xofigo; Dr.Crystal]; AUG 2017- Zytiga + Prednisone BID.   # Mets to bone- start X-geva q 11M [May 30th]  # Smoker/ chronic pain/pain clinic      Malignant neoplasm of prostate ([place])   08/01/2010 Initial Diagnosis    Malignant neoplasm of prostate [facility])       Prostate cancer metastatic to bone ([place])   12/08/2014 Initial Diagnosis    Prostate cancer metastatic to bone [facility])       INTERVAL HISTORY:   [name]  With above history of prostate cancer metastatic castrate resistant currently on Lupron + Zytiga is here for follow-up.  Patient continues to complain of chronic pain in his neck; also in his hips and back. He continues to narcotic pain medication. No weight gain. He continues to take narcotic pain medication.    Appetite is fair. Weight is fairly stable . Hot flashes improved. No abdominal pain. No blood in stools. No new shortness of breath or chest pain or cough.   REVIEW OF SYSTEMS:  A complete 10 point review of system is done which is negative except mentioned above/history of present illness.   PAST MEDICAL HISTORY :  Past Medical History:  Diagnosis Date  . ASCVD (arteriosclerotic cardiovascular disease)   . Back pain   . Back pain 10/09/2012  . Bone cancer ([place])   . Depression   . History of kidney stones   . Hypertension   . Joint pain   . Prostate cancer  ([place])   . SOB (shortness of breath) 10/09/2012  . Unable to ambulate 10/09/2012    PAST SURGICAL HISTORY :   Past Surgical History:  Procedure Laterality Date  . CARDIAC CATHETERIZATION     [facility]  . CARDIAC CATHETERIZATION N/A 08/16/2015   Procedure: Left Heart Cath and Coronary Angiography;  Surgeon: Yolonda Kida, MD;  Location: [facility];  Service: Cardiovascular;  Laterality: N/A;  . CARDIAC CATHETERIZATION N/A 08/16/2015   Procedure: Coronary Stent Intervention;  Surgeon: Yolonda Kida, MD;  Location: [place] [facility];  Service: Cardiovascular;  Laterality: N/A;  . CERVIX SURGERY    . PROSTATECTOMY    . SPINE SURGERY    . TONSILLECTOMY    . WRIST SURGERY      FAMILY HISTORY :   Family History  Problem Relation Age of Onset  . Heart attack Mother   . Hypertension Mother   . Heart attack Father     SOCIAL HISTORY:   Social History  Substance Use Topics  . Smoking status: Former Smoker    Packs/day: 1.00    Years: 40.00    Types: Cigarettes  . Smokeless tobacco: Never Used     Comment: "I use a pack a day in 24 hours. I have quit and started in past."  . Alcohol use No    ALLERGIES:  has No Known Allergies.  MEDICATIONS:  Current Outpatient Prescriptions  Medication  Sig Dispense Refill  . abiraterone Acetate (ZYTIGA) 250 MG tablet Take 4 tablets (1,000 mg total) by mouth daily. Take on an empty stomach 1 hour before or 2 hours after a meal 120 tablet 0  . acetaminophen (TYLENOL) 500 MG tablet Take 1,500 mg by mouth every 8 (eight) hours as needed.     Marland Kitchen aspirin 81 MG tablet Take 81 mg by mouth daily.    . baclofen (LIORESAL) 20 MG tablet TAKE 1 TABLET BY MOUTH TWICE DAILY 60 tablet 12  . metoprolol (LOPRESSOR) 50 MG tablet Take 50 mg by mouth 2 (two) times daily.    Marland Kitchen oxyCODONE (OXYCONTIN) 40 mg 12 hr tablet Take 1 tablet (40 mg total) by mouth every 12 (twelve) hours. 60 tablet 0  . Oxycodone HCl 10 MG TABS One pill every 6- 8 hours as needed  for pain 120 tablet 0  . rosuvastatin (CRESTOR) 20 MG tablet Take 1 tablet (20 mg total) by mouth daily. 90 tablet 3  . ticagrelor (BRILINTA) 90 MG TABS tablet Take 1 tablet (90 mg total) by mouth 2 (two) times daily. 60 tablet 12  . Wheat Dextrin (BENEFIBER) POWD Stir 2 tsp. TID into 4-8 oz of any non-carbonated beverage or soft food (hot or cold) 500 g PRN  . losartan (COZAAR) 50 MG tablet Take 1 table once daily (Patient not taking: Reported on 11/23/2015) 30 tablet 12   No current facility-administered medications for this visit.     PHYSICAL EXAMINATION: ECOG PERFORMANCE STATUS: 1 - Symptomatic but completely ambulatory  BP 137/86 (BP Location: Left Arm, Patient Position: Sitting)   Pulse 67   Temp 97 F (36.1 C) (Tympanic)   Resp 18   Wt 194 lb 12.4 oz (88.3 kg)   BMI 27.17 kg/m   Filed Weights   11/23/15 1355  Weight: 194 lb 12.4 oz (88.3 kg)   GENERAL: Well-nourished well-developed; Alert, no distress and comfortable. Accompanied by his wife. EYES: no pallor or icterus OROPHARYNX: no thrush or ulceration; Upper dentures. NECK: supple, no masses felt LYMPH: no palpable lymphadenopathy in the cervical, axillary or inguinal regions LUNGS: clear to auscultation and No wheeze or crackles HEART/CVS: regular rate & rhythm and no murmurs; No lower extremity edema ABDOMEN:abdomen soft, non-tender and normal bowel sounds; No hepatomegaly. Musculoskeletal:no cyanosis of digits and no clubbing; No significant tenderness noted. PSYCH: alert & oriented x 3 with fluent speech NEURO: no focal motor/sensory deficits SKIN: no rashes or significant lesions   LABORATORY DATA:  I have reviewed the data as listed    Component Value Date/Time   NA 137 11/18/2015 1335   NA 140 07/13/2013 1542   K 3.8 11/18/2015 1335   K 4.3 07/13/2013 1542   CL 105 11/18/2015 1335   CL 107 07/13/2013 1542   CO2 25 11/18/2015 1335   CO2 26 07/13/2013 1542   GLUCOSE 110 (H) 11/18/2015 1335    GLUCOSE 106 (H) 07/13/2013 1542   BUN 19 11/18/2015 1335   BUN 11 07/13/2013 1542   CREATININE 0.84 11/18/2015 1335   CREATININE 1.01 07/13/2013 1542   CALCIUM 9.2 11/18/2015 1335   CALCIUM 9.9 07/13/2013 1542   PROT 6.5 11/18/2015 1335   PROT 8.1 07/13/2013 1542   ALBUMIN 3.9 11/18/2015 1335   ALBUMIN 4.3 07/13/2013 1542   AST 17 11/18/2015 1335   AST 26 07/13/2013 1542   ALT 17 11/18/2015 1335   ALT 32 07/13/2013 1542   ALKPHOS 36 (L) 11/18/2015 1335   ALKPHOS 74  07/13/2013 1542   BILITOT 0.2 (L) 11/18/2015 1335   BILITOT 0.4 07/13/2013 1542   GFRNONAA >60 11/18/2015 1335   GFRNONAA >60 07/13/2013 1542   GFRAA >60 11/18/2015 1335   GFRAA >60 07/13/2013 1542    No results found for: SPEP, UPEP  Lab Results  Component Value Date   WBC 6.4 11/18/2015   NEUTROABS 4.7 11/18/2015   HGB 12.2 (L) 11/18/2015   HCT 35.9 (L) 11/18/2015   MCV 94.3 11/18/2015   PLT 149 (L) 11/18/2015      Chemistry      Component Value Date/Time   NA 137 11/18/2015 1335   NA 140 07/13/2013 1542   K 3.8 11/18/2015 1335   K 4.3 07/13/2013 1542   CL 105 11/18/2015 1335   CL 107 07/13/2013 1542   CO2 25 11/18/2015 1335   CO2 26 07/13/2013 1542   BUN 19 11/18/2015 1335   BUN 11 07/13/2013 1542   CREATININE 0.84 11/18/2015 1335   CREATININE 1.01 07/13/2013 1542      Component Value Date/Time   CALCIUM 9.2 11/18/2015 1335   CALCIUM 9.9 07/13/2013 1542   ALKPHOS 36 (L) 11/18/2015 1335   ALKPHOS 74 07/13/2013 1542   AST 17 11/18/2015 1335   AST 26 07/13/2013 1542   ALT 17 11/18/2015 1335   ALT 32 07/13/2013 1542   BILITOT 0.2 (L) 11/18/2015 1335   BILITOT 0.4 07/13/2013 1542      Results for Short, Robert L (MRN [id number]) as of 11/23/2015 14:02  Ref. Range 06/01/2015 11:35 07/14/2015 13:25 08/10/2015 14:03 10/05/2015 13:44 11/18/2015 13:35  PSA Latest Ref Range: 0.00 - 4.00 ng/mL 43.62 (H) 14.08 (H) 15.26 (H) 1.40 3.03  August, he is  ASSESSMENT & PLAN:    Prostate cancer metastatic  to bone ([place]) Castrate resistant prostate cancer-  Patient currently on Lupron androgen deprivation therapy; on Zytiga- since Aug 2017. No obvious clinical progression noted. However given the continued pain; recommend bone scan in 6 weeks.   # continue Zytiga 4 pill/ with predinsone 5 mg BID. PSA 3.03.    # X-geva/ Luprontoday; on ca+vit D. No AEs noted.   #   Back pain secondary malignancy status post radiation- stable/ on oxycontin 40 mg BID; oxycodone 10 mg every 8-12 hours. Await repeat bone scan to be done in about 6 weeks.  # Non-STEMI status post stenting/ significant improvement of symptoms- no recurrent events.   # follow up in bone scan weeks//labs; week later follow-up with me/ X-geva.       [name], MD 11/23/2015 2:32 PM

## 2015-12-13 ENCOUNTER — Other Ambulatory Visit: Payer: Self-pay | Admitting: Internal Medicine

## 2015-12-13 DIAGNOSIS — G893 Neoplasm related pain (acute) (chronic): Secondary | ICD-10-CM

## 2015-12-13 DIAGNOSIS — C61 Malignant neoplasm of prostate: Secondary | ICD-10-CM

## 2015-12-13 DIAGNOSIS — C7951 Secondary malignant neoplasm of bone: Principal | ICD-10-CM

## 2015-12-14 ENCOUNTER — Other Ambulatory Visit: Payer: Self-pay | Admitting: Internal Medicine

## 2015-12-14 DIAGNOSIS — C61 Malignant neoplasm of prostate: Secondary | ICD-10-CM

## 2015-12-14 DIAGNOSIS — G893 Neoplasm related pain (acute) (chronic): Secondary | ICD-10-CM

## 2015-12-14 DIAGNOSIS — C7951 Secondary malignant neoplasm of bone: Principal | ICD-10-CM

## 2015-12-15 MED ORDER — OXYCODONE HCL ER 40 MG PO T12A: 40.0000 mg | EXTENDED_RELEASE_TABLET | Freq: Two times a day (BID) | ORAL | 0 refills | Status: DC

## 2015-12-15 MED ORDER — OXYCODONE HCL 10 MG PO TABS: ORAL_TABLET | ORAL | 0 refills | Status: DC

## 2015-12-18 ENCOUNTER — Emergency Department
Admission: EM | Admit: 2015-12-18 | Discharge: 2015-12-18 | Disposition: A | Discharge status: Home / Self Care | Payer: 59 | Attending: Emergency Medicine | Admitting: Emergency Medicine

## 2015-12-18 ENCOUNTER — Encounter: Payer: Self-pay | Admitting: Emergency Medicine

## 2015-12-18 DIAGNOSIS — I10 Essential (primary) hypertension: Secondary | ICD-10-CM | POA: Insufficient documentation

## 2015-12-18 DIAGNOSIS — Z87891 Personal history of nicotine dependence: Secondary | ICD-10-CM | POA: Insufficient documentation

## 2015-12-18 DIAGNOSIS — Z79899 Other long term (current) drug therapy: Secondary | ICD-10-CM | POA: Diagnosis not present

## 2015-12-18 DIAGNOSIS — T50901A Poisoning by unspecified drugs, medicaments and biological substances, accidental (unintentional), initial encounter: Secondary | ICD-10-CM

## 2015-12-18 DIAGNOSIS — Z85830 Personal history of malignant neoplasm of bone: Secondary | ICD-10-CM | POA: Diagnosis not present

## 2015-12-18 DIAGNOSIS — Z8546 Personal history of malignant neoplasm of prostate: Secondary | ICD-10-CM | POA: Insufficient documentation

## 2015-12-18 DIAGNOSIS — Z7982 Long term (current) use of aspirin: Secondary | ICD-10-CM | POA: Diagnosis not present

## 2015-12-18 DIAGNOSIS — T466X1A Poisoning by antihyperlipidemic and antiarteriosclerotic drugs, accidental (unintentional), initial encounter: Secondary | ICD-10-CM | POA: Diagnosis not present

## 2015-12-18 DIAGNOSIS — T503X1A Poisoning by electrolytic, caloric and water-balance agents, accidental (unintentional), initial encounter: Secondary | ICD-10-CM | POA: Diagnosis not present

## 2015-12-18 DIAGNOSIS — I2510 Atherosclerotic heart disease of native coronary artery without angina pectoris: Secondary | ICD-10-CM | POA: Diagnosis not present

## 2015-12-18 LAB — CBC WITH DIFFERENTIAL/PLATELET
BASOS ABS: 0 10*3/uL (ref 0–0.1)
Basophils Relative: 0 %
EOS PCT: 1 %
Eosinophils Absolute: 0.1 10*3/uL (ref 0–0.7)
HCT: 35.3 % — ABNORMAL LOW (ref 40.0–52.0)
Hemoglobin: 12.3 g/dL — ABNORMAL LOW (ref 13.0–18.0)
LYMPHS PCT: 4 %
Lymphs Abs: 0.4 10*3/uL — ABNORMAL LOW (ref 1.0–3.6)
MCH: 32.6 pg (ref 26.0–34.0)
MCHC: 34.8 g/dL (ref 32.0–36.0)
MCV: 93.8 fL (ref 80.0–100.0)
MONO ABS: 1 10*3/uL (ref 0.2–1.0)
Monocytes Relative: 8 %
Neutro Abs: 10 10*3/uL — ABNORMAL HIGH (ref 1.4–6.5)
Neutrophils Relative %: 87 %
PLATELETS: 127 10*3/uL — AB (ref 150–440)
RBC: 3.76 MIL/uL — ABNORMAL LOW (ref 4.40–5.90)
RDW: 13.9 % (ref 11.5–14.5)
WBC: 11.5 10*3/uL — ABNORMAL HIGH (ref 3.8–10.6)

## 2015-12-18 LAB — COMPREHENSIVE METABOLIC PANEL
ALT: 26 U/L (ref 17–63)
AST: 30 U/L (ref 15–41)
Albumin: 3.9 g/dL (ref 3.5–5.0)
Alkaline Phosphatase: 61 U/L (ref 38–126)
Anion gap: 9 (ref 5–15)
BUN: 13 mg/dL (ref 6–20)
CHLORIDE: 105 mmol/L (ref 101–111)
CO2: 22 mmol/L (ref 22–32)
CREATININE: 0.79 mg/dL (ref 0.61–1.24)
Calcium: 9.1 mg/dL (ref 8.9–10.3)
Glucose, Bld: 161 mg/dL — ABNORMAL HIGH (ref 65–99)
POTASSIUM: 3.8 mmol/L (ref 3.5–5.1)
Sodium: 136 mmol/L (ref 135–145)
Total Bilirubin: 0.8 mg/dL (ref 0.3–1.2)
Total Protein: 6.8 g/dL (ref 6.5–8.1)

## 2015-12-18 LAB — TROPONIN I

## 2015-12-18 LAB — LIPASE, BLOOD: LIPASE: 10 U/L — AB (ref 11–51)

## 2015-12-18 LAB — CK: Total CK: 51 U/L (ref 49–397)

## 2015-12-18 MED ORDER — ONDANSETRON 4 MG PO TBDP: 4.0000 mg | ORAL_TABLET | Freq: Three times a day (TID) | ORAL | 0 refills | Status: DC | PRN

## 2015-12-18 MED ORDER — ONDANSETRON HCL 4 MG/2ML IJ SOLN
4.0000 mg | Freq: Once | INTRAMUSCULAR | Status: AC
  Administered 2015-12-18: 4 mg via INTRAVENOUS
  Filled 2015-12-18: qty 2

## 2015-12-18 MED ORDER — SODIUM CHLORIDE 0.9 % IV BOLUS (SEPSIS)
1000.0000 mL | Freq: Once | INTRAVENOUS | Status: AC
  Administered 2015-12-18: 1000 mL via INTRAVENOUS

## 2015-12-18 NOTE — ED Notes (Signed)
Pt reports he accidentally mixed his medications he takes Oxycodone for Bone pain. Pt reports he confused bottles and accidentally taking Crestor 20mg  for pain every 6 hrs instead of Oxycodone HCL 10mg . Reports severe headache(history of migraines) reports headache at present different from his migraines. Generalized body aches. Denis fever at home. Pt alert and oriented

## 2015-12-18 NOTE — ED Notes (Signed)
Pt. Going home with wife. 

## 2015-12-18 NOTE — ED Provider Notes (Signed)
[facility] Emergency Department Provider Note   ____________________________________________   First MD Initiated Contact with Patient 12/18/15 [phone]     (approximate)  I have reviewed the triage vital signs and the nursing notes.   HISTORY  Chief Complaint Drug Overdose (accident)    HPI Robert Short is a 65 y.o. male who presents to the ED from home with a chief complaint of accidental overdose. Patient states over the last 2 days he accidentally took 6-7 Crestor 20 mg pills instead of his oxycodone which she takes for bone pain.Complains of generalized headache typical of his migraine headaches, generalized myalgias, intense nausea, and flank pain. Denies associated fever, chills, chest pain, shortness of breath, vomiting, dysuria, diarrhea. Denies intentional ingestion or self-harm. Denies recent travel or trauma. Nothing makes his symptoms better or worse.   Past Medical History:  Diagnosis Date  . ASCVD (arteriosclerotic cardiovascular disease)   . Back pain   . Back pain 10/09/2012  . Bone cancer ([place])   . Depression   . History of kidney stones   . Hypertension   . Joint pain   . Prostate cancer ([place])   . SOB (shortness of breath) 10/09/2012  . Unable to ambulate 10/09/2012    Patient Active Problem List   Diagnosis Date Noted  . STEMI (ST elevation myocardial infarction) ([place]) 08/16/2015  . Acute ST elevation myocardial infarction (STEMI) involving right coronary artery without development of Q waves ([place]) 08/16/2015  . Neoplasm related pain 06/22/2015  . Cancer associated pain 02/04/2015  . Sebaceous cyst 01/18/2015  . Chronic neck pain 12/08/2014  . Cervical spondylosis 12/08/2014  . Chronic low back pain 12/08/2014  . Lumbar spondylosis 12/08/2014  . Chronic shoulder pain 12/08/2014  . Prostate cancer metastatic to bone ([place]) 12/08/2014  . Cancer-related pain 12/08/2014  . Neurogenic pain 12/08/2014  . Musculoskeletal pain  12/08/2014  . Chronic pain 12/07/2014  . Long term current use of opiate analgesic 12/07/2014  . Long term prescription opiate use 12/07/2014  . Opiate use 12/07/2014  . Encounter for therapeutic drug level monitoring 12/07/2014  . Encounter for chronic pain management 12/07/2014  . Therapeutic opioid-induced constipation (OIC) 12/07/2014  . Fatigue 08/27/2014  . CFIDS (chronic fatigue and immune dysfunction syndrome) ([place]) 06/04/2014  . Cervical spinal stenosis 06/04/2014  . Clinical depression 06/04/2014  . Essential (primary) hypertension 06/04/2014  . HLD (hyperlipidemia) 06/04/2014  . Amnesia 06/04/2014  . Chronic cervical pain 06/04/2014  . Neuropathy ([place]) 06/04/2014  . Loss of feeling or sensation 06/04/2014  . Blood glucose elevated 06/04/2014  . Pain in shoulder 06/04/2014  . Compulsive tobacco user syndrome 06/04/2014  . CAD (coronary artery disease) 10/09/2012  . Smoker 10/09/2012  . ED (erectile dysfunction) of organic origin 08/08/2012  . Acid reflux 08/08/2012  . Lower urinary tract symptoms 08/08/2012  . Prostate cancer 08/01/2010  . Restless leg 08/01/2010  . Malignant neoplasm of prostate ([place]) 08/01/2010  . Back ache 08/01/2010    Past Surgical History:  Procedure Laterality Date  . CARD[facility]AC CATHETERIZATION     [facility]  . CARDIAC CATHETERIZATION N/A 08/16/2015   Procedure: Left Heart Cath and Coronary Angiography;  Surgeon: Yolonda Kida, MD;  Location: [place] [facility];  Service: Cardiovascular;  Laterality: N/A;  . CARDIAC CATHETERIZATION N/A 08/16/2015   Procedure: Coronary Stent Intervention;  Surgeon: Yolonda Kida, MD;  Location: [place] [facility];  Service: Cardiovascular;  Laterality: N/A;  . CERVIX SURGERY    .  PROSTATECTOMY    . SPINE SURGERY    . TONSILLECTOMY    . WRIST SURGERY      Prior to Admission medications   Medication Sig Start Date End Date Taking? Authorizing Provider  abiraterone Acetate (ZYTIGA) 250 MG tablet Take 4  tablets (1,000 mg total) by mouth daily. Take on an empty stomach 1 hour before or 2 hours after a meal 07/26/15  Yes [name], MD  acetaminophen (TYLENOL) 500 MG tablet Take 1,500 mg by mouth every 8 (eight) hours as needed.  12/03/12  Yes Historical Provider, MD  aspirin 81 MG tablet Take 81 mg by mouth daily.   Yes Historical Provider, MD  baclofen (LIORESAL) 20 MG tablet TAKE 1 TABLET BY MOUTH TWICE DAILY 06/11/15  Yes Amy Overton Mam, NP  metoprolol (LOPRESSOR) 50 MG tablet Take 50 mg by mouth 2 (two) times daily.   Yes Historical Provider, MD  oxyCODONE (OXYCONTIN) 40 mg 12 hr tablet Take 1 tablet (40 mg total) by mouth every 12 (twelve) hours. 12/15/15  Yes [name], MD  Oxycodone HCl 10 MG TABS One pill every 6- 8 hours as needed for pain 12/15/15  Yes [name], MD  predniSONE (DELTASONE) 5 MG tablet Take 5 mg by mouth 2 (two) times daily with a meal.   Yes Historical Provider, MD  rosuvastatin (CRESTOR) 20 MG tablet Take 1 tablet (20 mg total) by mouth daily. 11/09/15 11/08/16 Yes Minna Merritts, MD  ticagrelor (BRILINTA) 90 MG TABS tablet Take 1 tablet (90 mg total) by mouth 2 (two) times daily. 08/18/15  Yes Dwayne D Clayborn Bigness, MD  Wheat Dextrin (BENEFIBER) POWD Stir 2 tsp. TID into 4-8 oz of any non-carbonated beverage or soft food (hot or cold) 12/07/14  Yes Milinda Pointer, MD  losartan (COZAAR) 50 MG tablet Take 1 table once daily Patient not taking: Reported on 11/23/2015 08/30/15   Arlis Porta., MD    Allergies Patient has no known allergies.  Family History  Problem Relation Age of Onset  . Heart attack Mother   . Hypertension Mother   . Heart attack Father     Social History Social History  Substance Use Topics  . Smoking status: Former Smoker    Packs/day: 1.00    Years: 40.00    Types: Cigarettes  . Smokeless tobacco: Never Used     Comment: "I use a pack a day in 24 hours. I have quit and started in past."  . Alcohol use No     Review of Systems  Constitutional: Positive for generalized myalgias. No fever/chills. Eyes: No visual changes. ENT: No sore throat. Cardiovascular: Denies chest pain. Respiratory: Denies shortness of breath. Gastrointestinal: No abdominal pain.  Positive for nausea, no vomiting.  No diarrhea.  No constipation. Genitourinary: Negative for dysuria. Musculoskeletal: Negative for back pain. Skin: Negative for rash. Neurological: Positive for headache. Negative for focal weakness or numbness. Psychiatric:Denies SI/HI/AH/VH.  10-point ROS otherwise negative.  ____________________________________________   PHYSICAL EXAM:  VITAL SIGNS: ED Triage Vitals  Enc Vitals Group     BP 12/18/15 0311 (!) 150/67     Pulse Rate 12/18/15 0311 (!) 105     Resp 12/18/15 0311 18     Temp 12/18/15 0311 98.2 F (36.8 C)     Temp Source 12/18/15 0311 Oral     SpO2 12/18/15 0311 93 %     Weight 12/18/15 0311 190 lb (86.2 kg)     Height 12/18/15 0311  5\' 11"  (1.803 m)     Head Circumference --      Peak Flow --      Pain Score 12/18/15 0312 8     Pain Loc --      Pain Edu? --      Excl. in [place]? --     Constitutional: Alert and oriented. Well appearing and in moderate acute distress. Eyes: Conjunctivae are normal. PERRL. EOMI. Head: Atraumatic. Nose: No congestion/rhinnorhea. Mouth/Throat: Mucous membranes are moist.  Oropharynx non-erythematous. Neck: No stridor.   Cardiovascular: Normal rate, regular rhythm. Grossly normal heart sounds.  Good peripheral circulation. Respiratory: Normal respiratory effort.  No retractions. Lungs CTAB. Gastrointestinal: Soft and nontender. No distention. No abdominal bruits. No CVA tenderness. Musculoskeletal: No lower extremity tenderness nor edema.  No joint effusions. Neurologic:  Normal speech and language. No gross focal neurologic deficits are appreciated.  Skin:  Skin is warm, dry and intact. No rash noted. Psychiatric: Mood and affect are normal.  Speech and behavior are normal.  ____________________________________________   LABS (all labs ordered are listed, but only abnormal results are displayed)  Labs Reviewed  CBC WITH DIFFERENTIAL/PLATELET  COMPREHENSIVE METABOLIC PANEL  LIPASE, BLOOD  TROPONIN I  CK   ____________________________________________  EKG  ED ECG REPORT I, SUNG,JADE J, the attending physician, personally viewed and interpreted this ECG.   Date: 12/18/2015  EKG Time: 0405  Rate: 88  Rhythm: normal EKG, normal sinus rhythm  Axis: Normal  Intervals:none  ST&T Change: Nonspecific  ____________________________________________  RADIOLOGY  None ____________________________________________   PROCEDURES  Procedure(s) performed: None  Procedures  Critical Care performed: No  ____________________________________________   INITIAL IMPRESSION / ASSESSMENT AND PLAN / ED COURSE  Pertinent labs & imaging results that were available during my care of the patient were reviewed by me and considered in my medical decision making (see chart for details).  65 year old male who presents with accidental overdose of Crestor resulting in myalgias, nausea, headache, flank pain. Will obtain screening lab work including LFTs and CK, initiate IV fluid resuscitation, antiemetic and reassess.  Clinical Course as of Dec 18 715  Sat Dec 18, 2015  0557 Patient resting no acute distress. Updated patient and spouse on laboratory results and poison control recommendations. Will try PO challenge. Anticipate discharge patient is able to tolerate PO.  [JS]  [id number] Patient tolerated ginger ale without emesis. Strict return precautions given. Patient and spouse verbalized understanding and agree with plan of care.  [JS]    Clinical Course User Index [JS] Paulette Blanch, MD     ____________________________________________   FINAL CLINICAL IMPRESSION(S) / ED DIAGNOSES  Final diagnoses:  Accidental drug overdose,  initial encounter      NEW MEDICATIONS STARTED DURING THIS VISIT:  New Prescriptions   No medications on file     Note:  This document was prepared using Dragon voice recognition software and may include unintentional dictation errors.    Paulette Blanch, MD 12/18/15 [phone]

## 2015-12-18 NOTE — Discharge Instructions (Signed)
1. You may hold Crestor this weekend and resume on Monday. 2. Take Zofran as needed for nausea. 3. Return to the ER for worsening symptoms, persistent vomiting, difficulty breathing or other concerns.

## 2015-12-18 NOTE — ED Triage Notes (Addendum)
Pt reports accident overdose on crestor, mixed it up with his pain medication reports took 6-7 20mg  pills over last 2 days.  Reports intense nausea, flank pain, head ache, and generalized pain. pt nad at this time.

## 2015-12-18 NOTE — ED Notes (Addendum)
Patty from [employer] reports.  GI symptoms typically with Crestor, possible myalgias with higher doses. No need for labs, manage symptoms.

## 2015-12-21 ENCOUNTER — Other Ambulatory Visit: Payer: Self-pay | Admitting: Family Medicine

## 2015-12-21 DIAGNOSIS — I10 Essential (primary) hypertension: Secondary | ICD-10-CM

## 2016-01-04 ENCOUNTER — Inpatient Hospital Stay: Payer: 59 | Attending: Internal Medicine

## 2016-01-04 DIAGNOSIS — I10 Essential (primary) hypertension: Secondary | ICD-10-CM | POA: Diagnosis not present

## 2016-01-04 DIAGNOSIS — C61 Malignant neoplasm of prostate: Secondary | ICD-10-CM | POA: Diagnosis not present

## 2016-01-04 DIAGNOSIS — Z7982 Long term (current) use of aspirin: Secondary | ICD-10-CM | POA: Insufficient documentation

## 2016-01-04 DIAGNOSIS — Z87442 Personal history of urinary calculi: Secondary | ICD-10-CM | POA: Insufficient documentation

## 2016-01-04 DIAGNOSIS — G893 Neoplasm related pain (acute) (chronic): Secondary | ICD-10-CM | POA: Diagnosis not present

## 2016-01-04 DIAGNOSIS — Z9079 Acquired absence of other genital organ(s): Secondary | ICD-10-CM | POA: Insufficient documentation

## 2016-01-04 DIAGNOSIS — Z79899 Other long term (current) drug therapy: Secondary | ICD-10-CM | POA: Diagnosis not present

## 2016-01-04 DIAGNOSIS — F17210 Nicotine dependence, cigarettes, uncomplicated: Secondary | ICD-10-CM | POA: Diagnosis not present

## 2016-01-04 DIAGNOSIS — R300 Dysuria: Secondary | ICD-10-CM | POA: Insufficient documentation

## 2016-01-04 DIAGNOSIS — C7951 Secondary malignant neoplasm of bone: Secondary | ICD-10-CM | POA: Insufficient documentation

## 2016-01-04 DIAGNOSIS — Z923 Personal history of irradiation: Secondary | ICD-10-CM | POA: Insufficient documentation

## 2016-01-04 LAB — PSA: PSA: 6.79 ng/mL — ABNORMAL HIGH (ref 0.00–4.00)

## 2016-01-04 LAB — COMPREHENSIVE METABOLIC PANEL
ALBUMIN: 4.4 g/dL (ref 3.5–5.0)
ALK PHOS: 44 U/L (ref 38–126)
ALT: 17 U/L (ref 17–63)
AST: 17 U/L (ref 15–41)
Anion gap: 8 (ref 5–15)
BUN: 14 mg/dL (ref 6–20)
CALCIUM: 9.8 mg/dL (ref 8.9–10.3)
CO2: 27 mmol/L (ref 22–32)
CREATININE: 0.9 mg/dL (ref 0.61–1.24)
Chloride: 102 mmol/L (ref 101–111)
GFR calc non Af Amer: >60 mL/min (ref >60)
GLUCOSE: 124 mg/dL — AB (ref 65–99)
Potassium: 4.9 mmol/L (ref 3.5–5.1)
Sodium: 137 mmol/L (ref 135–145)
Total Bilirubin: 0.4 mg/dL (ref 0.3–1.2)
Total Protein: 6.9 g/dL (ref 6.5–8.1)

## 2016-01-04 LAB — CBC WITH DIFFERENTIAL/PLATELET
Basophils Absolute: 0 10*3/uL (ref 0–0.1)
Basophils Relative: 1 %
Eosinophils Absolute: 0.1 10*3/uL (ref 0–0.7)
Eosinophils Relative: 3 %
HCT: 38.5 % — ABNORMAL LOW (ref 40.0–52.0)
HEMOGLOBIN: 13 g/dL (ref 13.0–18.0)
LYMPHS ABS: 0.9 10*3/uL — AB (ref 1.0–3.6)
Lymphocytes Relative: 20 %
MCH: 32 pg (ref 26.0–34.0)
MCHC: 33.8 g/dL (ref 32.0–36.0)
MCV: 94.7 fL (ref 80.0–100.0)
Monocytes Absolute: 0.6 10*3/uL (ref 0.2–1.0)
Monocytes Relative: 13 %
NEUTROS ABS: 2.8 10*3/uL (ref 1.4–6.5)
NEUTROS PCT: 63 %
Platelets: 174 10*3/uL (ref 150–440)
RBC: 4.06 MIL/uL — AB (ref 4.40–5.90)
RDW: 13.7 % (ref 11.5–14.5)
WBC: 4.4 10*3/uL (ref 3.8–10.6)

## 2016-01-10 ENCOUNTER — Ambulatory Visit
Admission: RE | Admit: 2016-01-10 | Discharge: 2016-01-10 | Disposition: A | Discharge status: Home / Self Care | Payer: 59 | Source: Ambulatory Visit | Attending: Internal Medicine | Admitting: Internal Medicine

## 2016-01-10 DIAGNOSIS — M79671 Pain in right foot: Secondary | ICD-10-CM

## 2016-01-10 DIAGNOSIS — C61 Malignant neoplasm of prostate: Secondary | ICD-10-CM | POA: Insufficient documentation

## 2016-01-10 DIAGNOSIS — C7951 Secondary malignant neoplasm of bone: Secondary | ICD-10-CM | POA: Diagnosis not present

## 2016-01-10 DIAGNOSIS — M79601 Pain in right arm: Secondary | ICD-10-CM

## 2016-01-10 DIAGNOSIS — M79604 Pain in right leg: Secondary | ICD-10-CM

## 2016-01-10 HISTORY — DX: Pain in right leg: M79.604

## 2016-01-10 HISTORY — DX: Pain in right arm: M79.601

## 2016-01-10 HISTORY — DX: Pain in right foot: M79.671

## 2016-01-10 MED ORDER — TECHNETIUM TC 99M MEDRONATE IV KIT
25.0000 | PACK | Freq: Once | INTRAVENOUS | Status: AC | PRN
Start: 2016-01-10 — End: 2016-01-10
  Administered 2016-01-10: 21.069 via INTRAVENOUS

## 2016-01-11 ENCOUNTER — Inpatient Hospital Stay: Payer: 59

## 2016-01-11 ENCOUNTER — Telehealth: Payer: Self-pay | Admitting: Internal Medicine

## 2016-01-11 ENCOUNTER — Inpatient Hospital Stay (HOSPITAL_BASED_OUTPATIENT_CLINIC_OR_DEPARTMENT_OTHER): Payer: 59 | Admitting: Internal Medicine

## 2016-01-11 VITALS — BP 130/87 | HR 89 | Temp 98.7°F | Wt 196.8 lb

## 2016-01-11 DIAGNOSIS — F17210 Nicotine dependence, cigarettes, uncomplicated: Secondary | ICD-10-CM

## 2016-01-11 DIAGNOSIS — G893 Neoplasm related pain (acute) (chronic): Secondary | ICD-10-CM | POA: Diagnosis not present

## 2016-01-11 DIAGNOSIS — C61 Malignant neoplasm of prostate: Secondary | ICD-10-CM | POA: Diagnosis not present

## 2016-01-11 DIAGNOSIS — R300 Dysuria: Secondary | ICD-10-CM

## 2016-01-11 DIAGNOSIS — Z923 Personal history of irradiation: Secondary | ICD-10-CM | POA: Diagnosis not present

## 2016-01-11 DIAGNOSIS — C7951 Secondary malignant neoplasm of bone: Secondary | ICD-10-CM | POA: Diagnosis not present

## 2016-01-11 DIAGNOSIS — Z87442 Personal history of urinary calculi: Secondary | ICD-10-CM | POA: Diagnosis not present

## 2016-01-11 DIAGNOSIS — Z79899 Other long term (current) drug therapy: Secondary | ICD-10-CM

## 2016-01-11 DIAGNOSIS — I10 Essential (primary) hypertension: Secondary | ICD-10-CM | POA: Diagnosis not present

## 2016-01-11 LAB — URINALYSIS, COMPLETE (UACMP) WITH MICROSCOPIC
Bilirubin Urine: NEGATIVE
GLUCOSE, UA: NEGATIVE mg/dL
HGB URINE DIPSTICK: NEGATIVE
Ketones, ur: NEGATIVE mg/dL
Leukocytes, UA: NEGATIVE
NITRITE: NEGATIVE
PH: 6 (ref 5.0–8.0)
PROTEIN: 30 mg/dL — AB
Specific Gravity, Urine: 1.015 (ref 1.005–1.030)

## 2016-01-11 MED ORDER — OXYCODONE HCL ER 60 MG PO T12A: 40.0000 mg | EXTENDED_RELEASE_TABLET | Freq: Two times a day (BID) | ORAL | 0 refills | Status: DC

## 2016-01-11 MED ORDER — OXYCODONE HCL 10 MG PO TABS: ORAL_TABLET | ORAL | 0 refills | Status: DC

## 2016-01-11 MED ORDER — FENTANYL 100 MCG/HR TD PT72: 100.0000 ug | MEDICATED_PATCH | TRANSDERMAL | 0 refills | Status: DC

## 2016-01-11 NOTE — Telephone Encounter (Signed)
Per Ovid Curd at [facility], oxyCODONE (OXYCONTIN) 60 MG 12 hr tablet is out of stock and they do not know when it will be available.    Per Dr Rogue Bussing, advised pt that he can either try another pharmacy to see if medication is in stock or we can change therapy, possibly trying a patch.    Notified pt's wife, Caren Griffins, they will let us know tomorrow which they would like to do.

## 2016-01-11 NOTE — Progress Notes (Signed)
Patient here today for follow up. Patient c/o no appetite and pain. Patient states urine has dark color and odor

## 2016-01-11 NOTE — Progress Notes (Signed)
[facility] OFFICE PROGRESS NOTE  Patient Care Team: Robert Porta., MD as PCP - General (Family Medicine) Robert Merritts, MD as Consulting Physician (Cardiology)   SUMMARY OF ONCOLOGIC HISTORY: Oncology History   # 2011- PROSTATE CANCER [Gleason 3+4]; s/p Prostatectomy [ also involved bladder neck/ECP; Dr.Polaseck]; July 2014- Biochemical recurrence [PSA 14]- started on Zoladex [Dr.Pandit]; lost to follow up.  # JAN 2017- STAGE IV METASTATIC PROSTATE Cancer to Bone- Feb 13th, 2017-  Lupron q 36m [~end of feb]; PSA: 1021; Declined Chemo; April 2017 [xofigo x6; Dr.Crystal]; AUG 2017- Zytiga + Prednisone BID. Bone scan-Jan 2018- improved skeletal metastases.   # Mets to bone- start X-geva q 4M [May 30th]  # Smoker/ chronic pain/pain clinic      Malignant neoplasm of prostate ([place])   08/01/2010 Initial Diagnosis    Malignant neoplasm of prostate [facility])       Prostate cancer metastatic to bone ([place])   12/08/2014 Initial Diagnosis    Prostate cancer metastatic to bone [facility])       INTERVAL HISTORY:   [name]  With above history of prostate cancer metastatic castrate resistant currently on Lupron + Zytiga is here for follow-up. Stopped secondary to hypotension appx 1 month ago.  Patient also states of increasing bodyaches muscle aches "joint pains/back pain"- not currently controlled on OxyContin 40 twice a day and 10 of OxyContin every 12 hours as needed. Complains of fatigue.  Appetite is fair. Weight is fairly stable . Hot flashes improved. No abdominal pain. No blood in stools. No new shortness of breath or chest pain or cough.   Complains of burning pain while urination; concentrated urine. No fevers.  REVIEW OF SYSTEMS:  A complete 10 point review of system is done which is negative except mentioned above/history of present illness.   PAST MEDICAL HISTORY :  Past Medical History:  Diagnosis Date  . ASCVD  (arteriosclerotic cardiovascular disease)   . Back pain   . Back pain 10/09/2012  . Bone cancer ([place])   . Depression   . History of kidney stones   . Hypertension   . Joint pain   . Prostate cancer ([place])   . Right arm pain 01/10/2016  . Right foot pain 01/10/2016  . Right leg pain 01/10/2016  . SOB (shortness of breath) 10/09/2012  . Unable to ambulate 10/09/2012    PAST SURGICAL HISTORY :   Past Surgical History:  Procedure Laterality Date  . CARDIAC CATHETERIZATION     [facility]  . CARDIAC CATHETERIZATION N/A 08/16/2015   Procedure: Left Heart Cath and Coronary Angiography;  Surgeon: Robert Kida, MD;  Location: [facility];  Service: Cardiovascular;  Laterality: N/A;  . CARDIAC CATHETERIZATION N/A 08/16/2015   Procedure: Coronary Stent Intervention;  Surgeon: Robert Kida, MD;  Location: [facility];  Service: Cardiovascular;  Laterality: N/A;  . CERVIX SURGERY    . PROSTATECTOMY    . SPINE SURGERY    . TONSILLECTOMY    . WRIST SURGERY      FAMILY HISTORY :   Family History  Problem Relation Age of Onset  . Heart attack Mother   . Hypertension Mother   . Heart attack Father     SOCIAL HISTORY:   Social History  Substance Use Topics  . Smoking status: Former Smoker    Packs/day: 1.00    Years: 40.00    Types: Cigarettes  . Smokeless tobacco: Never Used  Comment: "I use a pack a day in 24 hours. I have quit and started in past."  . Alcohol use No    ALLERGIES:  has No Known Allergies.  MEDICATIONS:  Current Outpatient Prescriptions  Medication Sig Dispense Refill  . abiraterone Acetate (ZYTIGA) 250 MG tablet Take 4 tablets (1,000 mg total) by mouth daily. Take on an empty stomach 1 hour before or 2 hours after a meal 120 tablet 0  . acetaminophen (TYLENOL) 500 MG tablet Take 1,500 mg by mouth every 8 (eight) hours as needed.     Marland Kitchen aspirin 81 MG tablet Take 81 mg by mouth daily.    . baclofen (LIORESAL) 20 MG tablet TAKE 1 TABLET BY  MOUTH TWICE DAILY 60 tablet 12  . losartan (COZAAR) 50 MG tablet Take 1 table once daily 30 tablet 12  . metoprolol (LOPRESSOR) 50 MG tablet TAKE 1 TABLET BY MOUTH 2 TIMES DAILY. 180 tablet 3  . ondansetron (ZOFRAN ODT) 4 MG disintegrating tablet Take 1 tablet (4 mg total) by mouth every 8 (eight) hours as needed for nausea or vomiting. 20 tablet 0  . Oxycodone HCl 10 MG TABS One pill every 6- 8 hours as needed for pain 120 tablet 0  . predniSONE (DELTASONE) 5 MG tablet Take 5 mg by mouth 2 (two) times daily with a meal.    . rosuvastatin (CRESTOR) 20 MG tablet Take 1 tablet (20 mg total) by mouth daily. 90 tablet 3  . ticagrelor (BRILINTA) 90 MG TABS tablet Take 1 tablet (90 mg total) by mouth 2 (two) times daily. 60 tablet 12  . Wheat Dextrin (BENEFIBER) POWD Stir 2 tsp. TID into 4-8 oz of any non-carbonated beverage or soft food (hot or cold) 500 g PRN  . fentaNYL (DURAGESIC - DOSED MCG/HR) 100 MCG/HR Place 1 patch (100 mcg total) onto the skin every 3 (three) days. 5 patch 0   No current facility-administered medications for this visit.     PHYSICAL EXAMINATION: ECOG PERFORMANCE STATUS: 1 - Symptomatic but completely ambulatory  BP 130/87 (BP Location: Right Arm, Patient Position: Sitting)   Pulse 89   Temp 98.7 F (37.1 C) (Tympanic)   Wt 196 lb 12.2 oz (89.3 kg)   BMI 27.44 kg/m   Filed Weights   01/11/16 1343  Weight: 196 lb 12.2 oz (89.3 kg)   GENERAL: Well-nourished well-developed; Alert, no distress and comfortable. Accompanied by his wife. EYES: no pallor or icterus OROPHARYNX: no thrush or ulceration; Upper dentures. NECK: supple, no masses felt LYMPH: no palpable lymphadenopathy in the cervical, axillary or inguinal regions LUNGS: clear to auscultation and No wheeze or crackles HEART/CVS: regular rate & rhythm and no murmurs; No lower extremity edema ABDOMEN:abdomen soft, non-tender and normal bowel sounds; No hepatomegaly. Musculoskeletal:no cyanosis of  digits and no clubbing; No significant tenderness noted. PSYCH: alert & oriented x 3 with fluent speech NEURO: no focal motor/sensory deficits SKIN: no rashes or significant lesions   LABORATORY DATA:  I have reviewed the data as listed    Component Value Date/Time   NA 137 01/04/2016 1325   NA 140 07/13/2013 1542   K 4.9 01/04/2016 1325   K 4.3 07/13/2013 1542   CL 102 01/04/2016 1325   CL 107 07/13/2013 1542   CO2 27 01/04/2016 1325   CO2 26 07/13/2013 1542   GLUCOSE 124 (H) 01/04/2016 1325   GLUCOSE 106 (H) 07/13/2013 1542   BUN 14 01/04/2016 1325   BUN 11 07/13/2013 1542  CREATININE 0.90 01/04/2016 1325   CREATININE 1.01 07/13/2013 1542   CALCIUM 9.8 01/04/2016 1325   CALCIUM 9.9 07/13/2013 1542   PROT 6.9 01/04/2016 1325   PROT 8.1 07/13/2013 1542   ALBUMIN 4.4 01/04/2016 1325   ALBUMIN 4.3 07/13/2013 1542   AST 17 01/04/2016 1325   AST 26 07/13/2013 1542   ALT 17 01/04/2016 1325   ALT 32 07/13/2013 1542   ALKPHOS 44 01/04/2016 1325   ALKPHOS 74 07/13/2013 1542   BILITOT 0.4 01/04/2016 1325   BILITOT 0.4 07/13/2013 1542   GFRNONAA >60 01/04/2016 1325   GFRNONAA >60 07/13/2013 1542   GFRAA >60 01/04/2016 1325   GFRAA >60 07/13/2013 1542    No results found for: SPEP, UPEP  Lab Results  Component Value Date   WBC 4.4 01/04/2016   NEUTROABS 2.8 01/04/2016   HGB 13.0 01/04/2016   HCT 38.5 (L) 01/04/2016   MCV 94.7 01/04/2016   PLT 174 01/04/2016      Chemistry      Component Value Date/Time   NA 137 01/04/2016 1325   NA 140 07/13/2013 1542   K 4.9 01/04/2016 1325   K 4.3 07/13/2013 1542   CL 102 01/04/2016 1325   CL 107 07/13/2013 1542   CO2 27 01/04/2016 1325   CO2 26 07/13/2013 1542   BUN 14 01/04/2016 1325   BUN 11 07/13/2013 1542   CREATININE 0.90 01/04/2016 1325   CREATININE 1.01 07/13/2013 1542      Component Value Date/Time   CALCIUM 9.8 01/04/2016 1325   CALCIUM 9.9 07/13/2013 1542   ALKPHOS 44 01/04/2016 1325   ALKPHOS 74  07/13/2013 1542   AST 17 01/04/2016 1325   AST 26 07/13/2013 1542   ALT 17 01/04/2016 1325   ALT 32 07/13/2013 1542   BILITOT 0.4 01/04/2016 1325   BILITOT 0.4 07/13/2013 1542      Results for Short, Robert L (MRN [id number]) as of 11/23/2015 14:02  Ref. Range 06/01/2015 11:35 07/14/2015 13:25 08/10/2015 14:03 10/05/2015 13:44 11/18/2015 13:35  PSA Latest Ref Range: 0.00 - 4.00 ng/mL 43.62 (H) 14.08 (H) 15.26 (H) 1.40 3.03  August, he is  ASSESSMENT & PLAN:    Prostate cancer metastatic to bone ([place]) Castrate resistant prostate cancer-  Patient currently on Lupron androgen deprivation therapy; on Zytiga- since Aug 2017. No obvious clinical progression noted. Jan 2018- PSA- 6 [question because patient was not taking Zytiga]. Had a long discussion regarding use of chemotherapy in his clinical course. Patient finally has opened to the idea of chemotherapy; however posted delay it as long as he can.  # continue Zytiga but at 3 pill [reduced dose]/ with predinsone 5 mg BID.   # Low blood pressures/ nausea question from Zytiga- currently improved. Recommend taking Zytiga 3 pills a day; along with prednisone.  # X-geva/ Luprontoday; on ca+vit D. No AEs noted.   # dysuria- check UA.   #   Back pain secondary malignancy status post radiation- worsened/ increased the dose of to oxycontin 60 mg BID; oxycodone 10 mg every 8-12 hours. Unable to procure OxyContin- recommend fentanyl 100 g every 72 hours; continue oxycodone  # follow up in 4 weeks/labs- few days prior. He will be due for X-geva/ Lupron first week of March.      [name], MD 01/11/2016 5:20 PM

## 2016-01-11 NOTE — Telephone Encounter (Signed)
Pt's wife, Caren Griffins, called back stating that pt would like to try [facility] patch.

## 2016-01-11 NOTE — Telephone Encounter (Signed)
Fentynl patch Rx printed, signed and ready for pick up.    Rx picked up by pt's wife.  Notified nathan at pharmacy to discard OxyCODONE (OXYCONTIN) 60 MG 12 hr tablet

## 2016-01-11 NOTE — Assessment & Plan Note (Addendum)
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 No obvious clinical progression noted. Jan 2018- PSA- 6 [question because patient was not taking Zytiga]. Had a long discussion regarding use of chemotherapy in his clinical course. Patient finally has opened to the idea of chemotherapy; however posted delay it as long as he can.  # continue Zytiga but at 3 pill [reduced dose]/ with predinsone 5 mg BID.   # Low blood pressures/ nausea question from Zytiga- currently improved. Recommend taking Zytiga 3 pills a day; along with prednisone.  # X-geva/ Luprontoday; on ca+vit D. No AEs noted.   # dysuria- check UA.   #   Back pain secondary malignancy status post radiation- worsened/ increased the dose of to oxycontin 60 mg BID; oxycodone 10 mg every 8-12 hours. Unable to procure OxyContin- recommend fentanyl 100 g every 72 hours; continue oxycodone  # follow up in 4 weeks/labs- few days prior. He will be due for X-geva/ Lupron first week of March.

## 2016-01-11 NOTE — Progress Notes (Signed)
Contacted pt's wife- Wife informed of urine results. No antibiotic needed.

## 2016-01-11 NOTE — Telephone Encounter (Signed)
Ovid Curd at [facility] called with question about Rx. Please call: [phone]. Thanks!

## 2016-01-13 ENCOUNTER — Telehealth: Payer: Self-pay | Admitting: *Deleted

## 2016-01-13 ENCOUNTER — Encounter: Payer: Self-pay | Admitting: *Deleted

## 2016-01-13 NOTE — Telephone Encounter (Signed)
Prior auth initiated for fentanyl 100 mcg patch. Case number [id number].  Will take 24 hrs for approval per med[employer]

## 2016-01-17 ENCOUNTER — Encounter: Payer: Self-pay | Admitting: Family Medicine

## 2016-01-17 ENCOUNTER — Ambulatory Visit (INDEPENDENT_AMBULATORY_CARE_PROVIDER_SITE_OTHER): Payer: 59 | Admitting: Family Medicine

## 2016-01-17 VITALS — BP 122/74 | HR 87 | Temp 98.4°F | Resp 16 | Ht 71.0 in | Wt 196.0 lb

## 2016-01-17 DIAGNOSIS — R110 Nausea: Secondary | ICD-10-CM | POA: Insufficient documentation

## 2016-01-17 DIAGNOSIS — J209 Acute bronchitis, unspecified: Secondary | ICD-10-CM

## 2016-01-17 DIAGNOSIS — R6889 Other general symptoms and signs: Secondary | ICD-10-CM | POA: Diagnosis not present

## 2016-01-17 MED ORDER — BENZONATATE 100 MG PO CAPS: 100.0000 mg | ORAL_CAPSULE | Freq: Three times a day (TID) | ORAL | 0 refills | Status: DC | PRN

## 2016-01-17 MED ORDER — PROMETHAZINE HCL 12.5 MG PO TABS: 12.5000 mg | ORAL_TABLET | Freq: Three times a day (TID) | ORAL | 0 refills | Status: DC | PRN

## 2016-01-17 MED ORDER — PREDNISONE 50 MG PO TABS: 50.0000 mg | ORAL_TABLET | Freq: Every day | ORAL | 0 refills | Status: DC

## 2016-01-17 MED ORDER — OSELTAMIVIR PHOSPHATE 75 MG PO CAPS: 75.0000 mg | ORAL_CAPSULE | Freq: Two times a day (BID) | ORAL | 0 refills | Status: DC

## 2016-01-17 NOTE — Progress Notes (Signed)
Subjective:    Patient ID: Robert Short, male    DOB: 1950/11/08, 66 y.o.   MRN: [id number]  Robert Short is a 66 y.o. male presenting on 01/17/2016 for Fever (could be 101 as per spouse ear apin HA sore throat muscleache ribs feels like it's broken abd painful SOB obtw he also has abdominal pain) and Cough  Patient presents for a same day appointment.  HPI  FLU-LIKE SYMPTOMS / BRONCHITIS: - Reports symptoms onset about 4 days ago with acute onset stomach virus upset stomach, diarrhea, some abdominal discomfort, worsening nausea from baseline without significant vomiting, then felt better next day, and then following day worse again after being out in cold air, now seems to have persistent worsening flu-like symptoms, waxing and waning during afternoon but still worsening. Describes fevers up to 101F measured, worsening generalized muscle aches and pain (worse than usual chronic pain with metastatic prostate cancer to bones), fatigued, generalized weak without energy, worsening nausea still without vomiting, not taking prior rx Zofran as prescribed (thought he may have allergy to this medication). Also with productive thick sputum with cough, persistent coughing spells with ribcage soreness and pain)\ - Sick contacts with wife having same stomach illness 4 days ago then improved, she has not had any flu-like symptoms. He has had several potential public exposures to flu however no direct contact with confirmed influenza. S/p flu vaccine 10/11/2015 - Now diarrhea has resolved, was intermittent - Denies any active fever, chills, vomiting, headache, dizziness, chest pain, dyspnea  PMH (Complex and significant) with metastatic bone cancer with chronic generalized pain and nausea at baseline, on opiate medicines for chronic pain management per Oncology.   Social History  Substance Use Topics  . Smoking status: Former Smoker    Packs/day: 1.00    Years: 40.00    Types: Cigarettes  . Smokeless  tobacco: Never Used     Comment: "I use a pack a day in 24 hours. I have quit and started in past."  . Alcohol use No    Review of Systems Per HPI unless specifically indicated above     Objective:    BP 122/74   Pulse 87   Temp 98.4 F (36.9 C) (Oral)   Resp 16   Ht 5\' 11"  (1.803 m)   Wt 196 lb (88.9 kg)   SpO2 99%   BMI 27.34 kg/m   Wt Readings from Last 3 Encounters:  01/17/16 196 lb (88.9 kg)  01/11/16 196 lb 12.2 oz (89.3 kg)  12/18/15 190 lb (86.2 kg)    Physical Exam  Constitutional: He is oriented to person, place, and time. He appears well-developed and well-nourished. No distress.  Chronically ill has cane for ambulation but now seems more sick appearing but non-toxic, appears tired, uncomfortable with pain and some cough, cooperative  HENT:  Head: Normocephalic and atraumatic.  Frontal / maxillary sinuses non-tender. Nares patent with congestion but without purulence or edema. Bilateral TMs clear without erythema, effusion or bulging. Oropharynx clear without erythema, exudates, edema or asymmetry, but with dry mucus mem  Eyes: Conjunctivae are normal. Right eye exhibits no discharge. Left eye exhibits no discharge.  Neck: Normal range of motion. Neck supple.  Cardiovascular: Normal rate, regular rhythm, normal heart sounds and intact distal pulses.   No murmur heard. Pulmonary/Chest: No respiratory distress.  Speaks full sentences. No obvious respiratory distress. Some mild reduced respiratory effort due to generalized pain, overall good air movement with some scattered wheezes with transmitted upper  airway sounds. No focal crackles or rhonchi.  Abdominal: Soft. There is tenderness (generalized).  Musculoskeletal: He exhibits no edema.  Lymphadenopathy:    He has no cervical adenopathy.  Neurological: He is alert and oriented to person, place, and time.  Skin: Skin is warm and dry. No rash noted. He is not diaphoretic. No erythema.  Psychiatric: His behavior is  normal.  Nursing note and vitals reviewed.  Results for orders placed or performed in visit on 01/11/16  Urinalysis, Complete w Microscopic  Result Value Ref Range   Color, Urine YELLOW YELLOW   APPearance HAZY (A) CLEAR   Specific Gravity, Urine 1.015 1.005 - 1.030   pH 6.0 5.0 - 8.0   Glucose, UA NEGATIVE NEGATIVE mg/dL   Hgb urine dipstick NEGATIVE NEGATIVE   Bilirubin Urine NEGATIVE NEGATIVE   Ketones, ur NEGATIVE NEGATIVE mg/dL   Protein, ur 30 (A) NEGATIVE mg/dL   Nitrite NEGATIVE NEGATIVE   Leukocytes, UA NEGATIVE NEGATIVE   Squamous Epithelial / LPF 0-5 (A) NONE SEEN   WBC, UA 0-5 0 - 5 WBC/hpf   RBC / HPF 0-5 0 - 5 RBC/hpf   Bacteria, UA FEW (A) NONE SEEN   Mucous PRESENT    Ca Oxalate Crys, UA PRESENT       Assessment & Plan:   Problem List Items Addressed This Visit    Nausea - History of chronic nausea with metastatic cancer and chronic pain management on opiates, previously on Zofran but no longer taking due to possible allergy or intolerance. - Recent worsening with current flu like illness - Rx Phenergan 12.5mg  tabs - can take 1-2 q 8 hr PRN, cautioned on sedation, especially with other sedating medications    Relevant Medications   promethazine (PHENERGAN) 12.5 MG tablet    Other Visit Diagnoses    Flu-like symptoms    -  Primary - High level of suspicion / concern for influenza in setting of negative rapid flu testing today in office, also consider possible acute bronchitis with URI symptoms and productive sputum, persistent coughing, complicated by his chronic pain/ nausea with prostate CA metastatic to bones - Currently afebrile, and hemodynamically stable, tolerating PO  Plan: 1. Start empiric therapy for flu with Tamiflu 75 BID x 5 days, with onset symptoms within 48 hours, counseled effectiveness of med 2. Continue supportive care, see rest of A&P 3. Follow-up if not improving, if acute worsening or more related to flu-like weakness, can seek  treatment at hospital maybe need IVF, otherwise if improved but more concern for possible pneumonia or bronchitis, can return already offered CXR today (patient declined) and can re-visit this and possible antibiotics if needed    Relevant Medications   oseltamivir (TAMIFLU) 75 MG capsule   Acute bronchitis, unspecified organism     - Concern with possible influenza, vs URI bronchitis, chronically ill patient high risk for complication with illness - Rx Prednisone 50mg  daily x 5 days for burst with persistent cough, some wheezing, severe pain with coughing. Also note was previously given rx prednisone 5mg  BID while on Abiraterone Zytiga 250mg , however he has not been on either of these for 1 month, consider resuming in future - Rx Tessalon 100mg  TID PRN cough - Follow-up as planned if not improved     Relevant Medications   benzonatate (TESSALON) 100 MG capsule   predniSONE (DELTASONE) 50 MG tablet      Meds ordered this encounter  Medications  . oseltamivir (TAMIFLU) 75 MG capsule  Sig: Take 1 capsule (75 mg total) by mouth 2 (two) times daily. For 5 days    Dispense:  10 capsule    Refill:  0  . benzonatate (TESSALON) 100 MG capsule    Sig: Take 1 capsule (100 mg total) by mouth 3 (three) times daily as needed for cough.    Dispense:  30 capsule    Refill:  0  . predniSONE (DELTASONE) 50 MG tablet    Sig: Take 1 tablet (50 mg total) by mouth daily with breakfast.    Dispense:  5 tablet    Refill:  0  . promethazine (PHENERGAN) 12.5 MG tablet    Sig: Take 1-2 tablets (12.5-25 mg total) by mouth every 8 (eight) hours as needed for nausea or vomiting.    Dispense:  30 tablet    Refill:  0      Follow up plan: Return in about 1 week (around 01/24/2016), or if symptoms worsen or fail to improve, for Flu-like URI / Bronchitis.  Nobie Putnam, [facility] 01/17/2016, 3:52 PM

## 2016-01-17 NOTE — Patient Instructions (Signed)
Thank you for coming in to clinic today.  1. Flu test was negative today. However I am not entirely convinced that you still don't have the flu. - You do have productive cough with some wheezing, you may develop a pneumonia, however this does not seem as likely currently - Start with Tamiflu 1 capsule twice a day for 5 days for coverage on potential Flu - Start Prednisone 50mg  daily for next 5 days - this will open up lungs allow you to breath better and treat that wheezing or bronchospasm - Use Tessalon Perls (Benzonatate) 100mg  capsule up to 3 times daily as needed for cough - Continue Mucinex 2-3 times daily for next 7-10 days - Given rx Phenergan for nausea, take 1 pill 12.5mg  first to see if helping, ever 8 hours as needed, or if not helping, then can increase to 2 pills per dose every 8 hours, caution with sedation - Drink plenty of fluids to improve congestion - Continue current pain medications and Tylenol as needed  If your symptoms seem to worsen instead of improve over next several days, including significant fever / chills, worsening shortness of breath, worsening wheezing, or nausea / vomiting and can't take medicines - return sooner or go to hospital Emergency Department for more immediate treatment.  If extreme weakness and worsening flu symptoms can go to hospital, otherwise if improvement but concern with some persistent fevers, productive cough concern for pneumonia, can return here for X-ray and possibly antibiotics  Please schedule a follow-up appointment with Dr. Parks Ranger or Dr Luan Pulling in 1-2 weeks for Flu-like / Bronchitis  If you have any other questions or concerns, please feel free to call the clinic or send a message through [facility]. You may also schedule an earlier appointment if necessary.  Nobie Putnam, DO [place]

## 2016-01-25 ENCOUNTER — Other Ambulatory Visit: Payer: Self-pay | Admitting: *Deleted

## 2016-01-25 DIAGNOSIS — C61 Malignant neoplasm of prostate: Secondary | ICD-10-CM

## 2016-01-25 DIAGNOSIS — G893 Neoplasm related pain (acute) (chronic): Secondary | ICD-10-CM

## 2016-01-25 DIAGNOSIS — C7951 Secondary malignant neoplasm of bone: Principal | ICD-10-CM

## 2016-01-25 MED ORDER — FENTANYL 100 MCG/HR TD PT72: 100.0000 ug | MEDICATED_PATCH | TRANSDERMAL | 0 refills | Status: DC

## 2016-01-25 NOTE — Progress Notes (Signed)
Wife contacted cancer center. Pt needs rf on fentynl patch 100 mcg. rx renewed and placed in [facility] reception desk for pt's wife to sign for pick up.

## 2016-02-03 ENCOUNTER — Inpatient Hospital Stay: Payer: 59 | Attending: Internal Medicine

## 2016-02-03 DIAGNOSIS — G893 Neoplasm related pain (acute) (chronic): Secondary | ICD-10-CM | POA: Insufficient documentation

## 2016-02-03 DIAGNOSIS — Z87442 Personal history of urinary calculi: Secondary | ICD-10-CM | POA: Diagnosis not present

## 2016-02-03 DIAGNOSIS — C7951 Secondary malignant neoplasm of bone: Secondary | ICD-10-CM | POA: Insufficient documentation

## 2016-02-03 DIAGNOSIS — I10 Essential (primary) hypertension: Secondary | ICD-10-CM | POA: Insufficient documentation

## 2016-02-03 DIAGNOSIS — I2510 Atherosclerotic heart disease of native coronary artery without angina pectoris: Secondary | ICD-10-CM | POA: Insufficient documentation

## 2016-02-03 DIAGNOSIS — F17210 Nicotine dependence, cigarettes, uncomplicated: Secondary | ICD-10-CM | POA: Diagnosis not present

## 2016-02-03 DIAGNOSIS — C61 Malignant neoplasm of prostate: Secondary | ICD-10-CM | POA: Insufficient documentation

## 2016-02-03 DIAGNOSIS — Z923 Personal history of irradiation: Secondary | ICD-10-CM | POA: Insufficient documentation

## 2016-02-03 DIAGNOSIS — Z7982 Long term (current) use of aspirin: Secondary | ICD-10-CM | POA: Diagnosis not present

## 2016-02-03 DIAGNOSIS — K5900 Constipation, unspecified: Secondary | ICD-10-CM | POA: Insufficient documentation

## 2016-02-03 DIAGNOSIS — Z955 Presence of coronary angioplasty implant and graft: Secondary | ICD-10-CM | POA: Insufficient documentation

## 2016-02-03 DIAGNOSIS — Z79899 Other long term (current) drug therapy: Secondary | ICD-10-CM | POA: Insufficient documentation

## 2016-02-03 DIAGNOSIS — Z9079 Acquired absence of other genital organ(s): Secondary | ICD-10-CM | POA: Diagnosis not present

## 2016-02-03 LAB — BASIC METABOLIC PANEL
Anion gap: 7 (ref 5–15)
BUN: 12 mg/dL (ref 6–20)
CALCIUM: 9.2 mg/dL (ref 8.9–10.3)
CO2: 23 mmol/L (ref 22–32)
CREATININE: 0.95 mg/dL (ref 0.61–1.24)
Chloride: 107 mmol/L (ref 101–111)
GFR calc Af Amer: >60 mL/min (ref >60)
GLUCOSE: 124 mg/dL — AB (ref 65–99)
Potassium: 4.3 mmol/L (ref 3.5–5.1)
Sodium: 137 mmol/L (ref 135–145)

## 2016-02-03 LAB — CBC WITH DIFFERENTIAL/PLATELET
BASOS ABS: 0.1 10*3/uL (ref 0–0.1)
BASOS PCT: 1 %
EOS ABS: 0.2 10*3/uL (ref 0–0.7)
Eosinophils Relative: 3 %
HEMATOCRIT: 36 % — AB (ref 40.0–52.0)
HEMOGLOBIN: 12 g/dL — AB (ref 13.0–18.0)
Lymphocytes Relative: 18 %
Lymphs Abs: 0.9 10*3/uL — ABNORMAL LOW (ref 1.0–3.6)
MCH: 31.3 pg (ref 26.0–34.0)
MCHC: 33.4 g/dL (ref 32.0–36.0)
MCV: 93.6 fL (ref 80.0–100.0)
MONOS PCT: 13 %
Monocytes Absolute: 0.7 10*3/uL (ref 0.2–1.0)
NEUTROS ABS: 3.5 10*3/uL (ref 1.4–6.5)
NEUTROS PCT: 65 %
PLATELETS: 185 10*3/uL (ref 150–440)
RBC: 3.84 MIL/uL — ABNORMAL LOW (ref 4.40–5.90)
RDW: 14.1 % (ref 11.5–14.5)
WBC: 5.3 10*3/uL (ref 3.8–10.6)

## 2016-02-03 LAB — PSA: PSA: 10.47 ng/mL — ABNORMAL HIGH (ref 0.00–4.00)

## 2016-02-08 ENCOUNTER — Inpatient Hospital Stay (HOSPITAL_BASED_OUTPATIENT_CLINIC_OR_DEPARTMENT_OTHER): Payer: 59 | Admitting: Internal Medicine

## 2016-02-08 VITALS — BP 145/93 | HR 84 | Temp 97.5°F | Wt 196.4 lb

## 2016-02-08 DIAGNOSIS — C7951 Secondary malignant neoplasm of bone: Secondary | ICD-10-CM | POA: Diagnosis not present

## 2016-02-08 DIAGNOSIS — I10 Essential (primary) hypertension: Secondary | ICD-10-CM | POA: Diagnosis not present

## 2016-02-08 DIAGNOSIS — K5900 Constipation, unspecified: Secondary | ICD-10-CM | POA: Diagnosis not present

## 2016-02-08 DIAGNOSIS — I2510 Atherosclerotic heart disease of native coronary artery without angina pectoris: Secondary | ICD-10-CM | POA: Diagnosis not present

## 2016-02-08 DIAGNOSIS — Z79899 Other long term (current) drug therapy: Secondary | ICD-10-CM

## 2016-02-08 DIAGNOSIS — Z923 Personal history of irradiation: Secondary | ICD-10-CM

## 2016-02-08 DIAGNOSIS — F17210 Nicotine dependence, cigarettes, uncomplicated: Secondary | ICD-10-CM

## 2016-02-08 DIAGNOSIS — G893 Neoplasm related pain (acute) (chronic): Secondary | ICD-10-CM

## 2016-02-08 DIAGNOSIS — C61 Malignant neoplasm of prostate: Secondary | ICD-10-CM

## 2016-02-08 MED ORDER — FENTANYL 100 MCG/HR TD PT72
100.0000 ug | MEDICATED_PATCH | TRANSDERMAL | 0 refills | Status: DC
Start: 2016-02-08 — End: 2016-02-22

## 2016-02-08 MED ORDER — OXYCODONE HCL 10 MG PO TABS
ORAL_TABLET | ORAL | 0 refills | Status: DC
Start: 2016-02-08 — End: 2016-03-07

## 2016-02-08 NOTE — Assessment & Plan Note (Addendum)
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 Feb 2018- 10/ slowly rising. Jan 2018- bone scan- no significant active disease.   # OFF Zytiga x3 months [sec to multiple intolerance]; Wants to  Try at  3 pill [reduced dose]/ with predinsone 5 mg BID.   # recent Bronchitis/flu- s/p tamiflu.symptoms improved.   # X-geva/ Lupron every 3 months- on ca+vit D. No AEs noted. Due next month.   # Back pain secondary malignancy status post radiation; improved; continue current regimen- fentanyl 100 g every 72 hours; continue oxycodone 10 mg every 8-12 hours. New scripts given.   # follow up in 4 weeks/X-geva-Lupron; labs- psa/testosterone- few days prior.

## 2016-02-08 NOTE — Progress Notes (Signed)
[place] OFFICE PROGRESS NOTE  Patient Care Team: Arlis Porta., MD as PCP - General (Family Medicine) Minna Merritts, MD as Consulting Physician (Cardiology)   SUMMARY OF ONCOLOGIC HISTORY: Oncology History   # 2011- PROSTATE CANCER [Gleason 3+4]; s/p Prostatectomy [ also involved bladder neck/ECP; Dr.Polaseck]; July 2014- Biochemical recurrence [PSA 14]- started on Zoladex [Dr.Pandit]; lost to follow up.  # JAN 2017- STAGE IV METASTATIC PROSTATE Cancer to Bone- Feb 13th, 2017-  Lupron q 95m [~end of feb]; PSA: 1021; Declined Chemo; April 2017 [xofigo x6; Dr.Crystal]; AUG 2017- Zytiga + Prednisone BID. Bone scan-Jan 2018- improved skeletal metastases.   # Mets to bone- start X-geva q 25M [May 30th]  # Smoker/ chronic pain/pain clinic      Malignant neoplasm of prostate ([place])   08/01/2010 Initial Diagnosis    Malignant neoplasm of prostate [facility])       Prostate cancer metastatic to bone ([place])   12/08/2014 Initial Diagnosis    Prostate cancer metastatic to bone [facility])       INTERVAL HISTORY:   66 year old pleasant Caucasian male patient  With above history of prostate cancer metastatic castrate resistant currently on Lupron + Zytiga is here for follow-up. Stopped secondary to hypotension appx 2 Months ago. He was supposed to start it at last visit; but did not start because of a "few/bronchitis".   This was treated with steroids and also Tamiflu. Patient feels symptomatically better.  Regards to pain- better controlled on fentanyl 100 and oxycodone 10 milligrams every 8-12 hours.    Complains of constipation- currently on stool softener. No blood in stools.  REVIEW OF SYSTEMS:  A complete 10 point review of system is done which is negative except mentioned above/history of present illness.   PAST MEDICAL HISTORY :  Past Medical History:  Diagnosis Date  . ASCVD (arteriosclerotic cardiovascular disease)   . Back pain   . Back pain 10/09/2012  . Bone  cancer ([place])   . Cancer associated pain   . Depression   . History of kidney stones   . Hypertension   . Joint pain   . Prostate cancer ([place])   . Right arm pain 01/10/2016  . Right foot pain 01/10/2016  . Right leg pain 01/10/2016  . SOB (shortness of breath) 10/09/2012  . Unable to ambulate 10/09/2012    PAST SURGICAL HISTORY :   Past Surgical History:  Procedure Laterality Date  . CARDIAC CATHETERIZATION     [facility]  . CARDIAC CATHETERIZATION N/A 08/16/2015   Procedure: Left Heart Cath and Coronary Angiography;  Surgeon: Yolonda Kida, MD;  Location: [place] [facility];  Service: Cardiovascular;  Laterality: N/A;  . CARDIAC CATHETERIZATION N/A 08/16/2015   Procedure: Coronary Stent Intervention;  Surgeon: Yolonda Kida, MD;  Location: [place] [facility];  Service: Cardiovascular;  Laterality: N/A;  . CERVIX SURGERY    . PROSTATECTOMY    . SPINE SURGERY    . TONSILLECTOMY    . WRIST SURGERY      FAMILY HISTORY :   Family History  Problem Relation Age of Onset  . Heart attack Mother   . Hypertension Mother   . Heart attack Father     SOCIAL HISTORY:   Social History  Substance Use Topics  . Smoking status: Former Smoker    Packs/day: 1.00    Years: 40.00    Types: Cigarettes  . Smokeless tobacco: Never Used     Comment: "I use a  pack a day in 24 hours. I have quit and started in past."  . Alcohol use No    ALLERGIES:  has No Known Allergies.  MEDICATIONS:  Current Outpatient Prescriptions  Medication Sig Dispense Refill  . acetaminophen (TYLENOL) 500 MG tablet Take 1,500 mg by mouth every 8 (eight) hours as needed.     Marland Kitchen aspirin 81 MG tablet Take 81 mg by mouth daily.    . baclofen (LIORESAL) 20 MG tablet TAKE 1 TABLET BY MOUTH TWICE DAILY 60 tablet 12  . fentaNYL (DURAGESIC - DOSED MCG/HR) 100 MCG/HR Place 1 patch (100 mcg total) onto the skin every 3 (three) days. 5 patch 0  . metoprolol (LOPRESSOR) 50 MG tablet TAKE 1 TABLET BY MOUTH 2 TIMES DAILY.  180 tablet 3  . Oxycodone HCl 10 MG TABS One pill every 8 hours as needed for pain 90 tablet 0  . promethazine (PHENERGAN) 12.5 MG tablet Take 1-2 tablets (12.5-25 mg total) by mouth every 8 (eight) hours as needed for nausea or vomiting. 30 tablet 0  . ticagrelor (BRILINTA) 90 MG TABS tablet Take 1 tablet (90 mg total) by mouth 2 (two) times daily. 60 tablet 12  . Wheat Dextrin (BENEFIBER) POWD Stir 2 tsp. TID into 4-8 oz of any non-carbonated beverage or soft food (hot or cold) 500 g PRN  . abiraterone Acetate (ZYTIGA) 250 MG tablet Take 4 tablets (1,000 mg total) by mouth daily. Take on an empty stomach 1 hour before or 2 hours after a meal (Patient not taking: Reported on 02/08/2016) 120 tablet 0  . benzonatate (TESSALON) 100 MG capsule Take 1 capsule (100 mg total) by mouth 3 (three) times daily as needed for cough. (Patient not taking: Reported on 02/08/2016) 30 capsule 0  . predniSONE (DELTASONE) 5 MG tablet Take 5 mg by mouth 2 (two) times daily with a meal.    . rosuvastatin (CRESTOR) 20 MG tablet Take 1 tablet (20 mg total) by mouth daily. (Patient not taking: Reported on 02/08/2016) 90 tablet 3   No current facility-administered medications for this visit.     PHYSICAL EXAMINATION: ECOG PERFORMANCE STATUS: 1 - Symptomatic but completely ambulatory  BP (!) 145/93 (BP Location: Right Arm, Patient Position: Sitting)   Pulse 84   Temp 97.5 F (36.4 C) (Tympanic)   Wt 196 lb 6.9 oz (89.1 kg)   BMI 27.40 kg/m   Filed Weights   02/08/16 1145  Weight: 196 lb 6.9 oz (89.1 kg)   GENERAL: Well-nourished well-developed; Alert, no distress and comfortable. Accompanied by his wife. EYES: no pallor or icterus OROPHARYNX: no thrush or ulceration; Upper dentures. NECK: supple, no masses felt LYMPH: no palpable lymphadenopathy in the cervical, axillary or inguinal regions LUNGS: clear to auscultation and No wheeze or crackles HEART/CVS: regular rate & rhythm and no murmurs; No lower  extremity edema ABDOMEN:abdomen soft, non-tender and normal bowel sounds; No hepatomegaly. Musculoskeletal:no cyanosis of digits and no clubbing; No significant tenderness noted. PSYCH: alert & oriented x 3 with fluent speech NEURO: no focal motor/sensory deficits SKIN: no rashes or significant lesions   LABORATORY DATA:  I have reviewed the data as listed    Component Value Date/Time   NA 137 02/03/2016 1351   NA 140 07/13/2013 1542   K 4.3 02/03/2016 1351   K 4.3 07/13/2013 1542   CL 107 02/03/2016 1351   CL 107 07/13/2013 1542   CO2 23 02/03/2016 1351   CO2 26 07/13/2013 1542   GLUCOSE 124 (H)  02/03/2016 1351   GLUCOSE 106 (H) 07/13/2013 1542   BUN 12 02/03/2016 1351   BUN 11 07/13/2013 1542   CREATININE 0.95 02/03/2016 1351   CREATININE 1.01 07/13/2013 1542   CALCIUM 9.2 02/03/2016 1351   CALCIUM 9.9 07/13/2013 1542   PROT 6.9 01/04/2016 1325   PROT 8.1 07/13/2013 1542   ALBUMIN 4.4 01/04/2016 1325   ALBUMIN 4.3 07/13/2013 1542   AST 17 01/04/2016 1325   AST 26 07/13/2013 1542   ALT 17 01/04/2016 1325   ALT 32 07/13/2013 1542   ALKPHOS 44 01/04/2016 1325   ALKPHOS 74 07/13/2013 1542   BILITOT 0.4 01/04/2016 1325   BILITOT 0.4 07/13/2013 1542   GFRNONAA >60 02/03/2016 1351   GFRNONAA >60 07/13/2013 1542   GFRAA >60 02/03/2016 1351   GFRAA >60 07/13/2013 1542    No results found for: SPEP, UPEP  Lab Results  Component Value Date   WBC 5.3 02/03/2016   NEUTROABS 3.5 02/03/2016   HGB 12.0 (L) 02/03/2016   HCT 36.0 (L) 02/03/2016   MCV 93.6 02/03/2016   PLT 185 02/03/2016      Chemistry      Component Value Date/Time   NA 137 02/03/2016 1351   NA 140 07/13/2013 1542   K 4.3 02/03/2016 1351   K 4.3 07/13/2013 1542   CL 107 02/03/2016 1351   CL 107 07/13/2013 1542   CO2 23 02/03/2016 1351   CO2 26 07/13/2013 1542   BUN 12 02/03/2016 1351   BUN 11 07/13/2013 1542   CREATININE 0.95 02/03/2016 1351   CREATININE 1.01 07/13/2013 1542      Component  Value Date/Time   CALCIUM 9.2 02/03/2016 1351   CALCIUM 9.9 07/13/2013 1542   ALKPHOS 44 01/04/2016 1325   ALKPHOS 74 07/13/2013 1542   AST 17 01/04/2016 1325   AST 26 07/13/2013 1542   ALT 17 01/04/2016 1325   ALT 32 07/13/2013 1542   BILITOT 0.4 01/04/2016 1325   BILITOT 0.4 07/13/2013 1542     Results for Hulse, Jaxsyn L (MRN [id number]) as of 02/08/2016 11:43  Ref. Range 08/17/2004 08:38 07/10/2005 16:47 07/19/2005 11:33 07/19/2005 16:45 03/15/2006 11:24 03/16/2006 11:17 03/22/2006 11:14 04/06/2008 15:16 02/09/2010 19:08 02/09/2010 20:17 02/09/2010 20:21 07/15/2012 13:47 08/15/2012 10:21 08/15/2012 11:12 08/15/2012 11:13 08/16/2012 10:15 08/16/2012 11:09 08/21/2012 11:00 08/21/2012 11:11 08/21/2012 12:12 10/09/2012 15:38 10/16/2012 10:10 10/16/2012 11:26 12/05/2012 10:07 04/19/2013 19:44 04/19/2013 19:47 04/19/2013 20:12 05/23/2013 13:41 07/11/2013 13:17 07/13/2013 15:42 07/13/2013 16:03 07/13/2013 20:59 07/24/2013 10:01 11/03/2013 16:19 09/29/2014 13:52 12/07/2014 00:00 01/29/2015 00:00 02/01/2015 14:50 02/04/2015 10:50 02/04/2015 13:25 02/08/2015 09:52 02/11/2015 10:47 03/16/2015 10:32 04/06/2015 16:17 04/20/2015 13:28 04/27/2015 11:49 05/19/2015 13:35 05/24/2015 15:02 05/26/2015 11:18 06/01/2015 11:35 06/17/2015 12:52 06/22/2015 15:14 07/14/2015 13:25 08/10/2015 14:03 08/16/2015 16:19 08/16/2015 16:19 08/16/2015 16:19 08/16/2015 16:22 08/16/2015 16:26 08/16/2015 16:30 08/16/2015 17:06 08/16/2015 18:33 08/16/2015 21:13 08/17/2015 04:16 08/17/2015 05:16 08/17/2015 15:43 08/17/2015 20:52 08/18/2015 17:10 09/15/2015 14:25 10/05/2015 13:44 11/09/2015 00:00 11/18/2015 13:35 12/18/2015 03:47 12/18/2015 04:05 01/04/2016 13:25 01/10/2016 14:42 01/11/2016 14:33 02/03/2016 13:51  PSA Latest Ref Range: 0.00 - 4.00 ng/mL            14.2 (H) 14.6 (H)           0.2                 1,021.00 (H)     33.39 (H)    43.62 (H)   14.08 (H) 15.26 (H)  1.40  3.03   6.79 (H)   10.47 (H)    ASSESSMENT & PLAN:    Prostate cancer metastatic to bone (HCC) Castrate resistant prostate cancer-   Patient currently on Lupron androgen deprivation therapy; on Zytiga- since Aug 2017. Feb 2018- 10/ slowly rising. Jan 2018- bone scan- no significant active disease.   # OFF Zytiga x3 months [sec to multiple intolerance]; Wants to  Try at  3 pill [reduced dose]/ with predinsone 5 mg BID.   # recent Bronchitis/flu- s/p tamiflu.symptoms improved.   # X-geva/ Lupron every 3 months- on ca+vit D. No AEs noted. Due next month.   # Back pain secondary malignancy status post radiation; improved; continue current regimen- fentanyl 100 g every 72 hours; continue oxycodone 10 mg every 8-12 hours. New scripts given.   # follow up in 4 weeks/X-geva-Lupron; labs- psa/testosterone- few days prior.      [name], MD 02/08/2016 12:04 PM

## 2016-02-08 NOTE — Progress Notes (Signed)
Patient here today for follow up.  Patient c/o back pain today  

## 2016-02-15 ENCOUNTER — Ambulatory Visit: Payer: Medicare Other | Admitting: Family Medicine

## 2016-02-15 ENCOUNTER — Ambulatory Visit: Payer: 59 | Admitting: Family Medicine

## 2016-02-21 ENCOUNTER — Encounter: Payer: Self-pay | Admitting: Family Medicine

## 2016-02-21 ENCOUNTER — Ambulatory Visit (INDEPENDENT_AMBULATORY_CARE_PROVIDER_SITE_OTHER): Payer: 59 | Admitting: Family Medicine

## 2016-02-21 ENCOUNTER — Encounter: Payer: Self-pay | Admitting: *Deleted

## 2016-02-21 VITALS — BP 135/70 | HR 60 | Temp 98.0°F | Resp 16 | Ht 71.0 in | Wt 194.0 lb

## 2016-02-21 DIAGNOSIS — I2102 ST elevation (STEMI) myocardial infarction involving left anterior descending coronary artery: Secondary | ICD-10-CM | POA: Diagnosis not present

## 2016-02-21 DIAGNOSIS — Z79891 Long term (current) use of opiate analgesic: Secondary | ICD-10-CM

## 2016-02-21 DIAGNOSIS — C7951 Secondary malignant neoplasm of bone: Secondary | ICD-10-CM

## 2016-02-21 DIAGNOSIS — C61 Malignant neoplasm of prostate: Secondary | ICD-10-CM

## 2016-02-21 DIAGNOSIS — I10 Essential (primary) hypertension: Secondary | ICD-10-CM | POA: Diagnosis not present

## 2016-02-21 DIAGNOSIS — I2109 ST elevation (STEMI) myocardial infarction involving other coronary artery of anterior wall: Secondary | ICD-10-CM

## 2016-02-21 DIAGNOSIS — I2510 Atherosclerotic heart disease of native coronary artery without angina pectoris: Secondary | ICD-10-CM | POA: Diagnosis not present

## 2016-02-21 DIAGNOSIS — G893 Neoplasm related pain (acute) (chronic): Secondary | ICD-10-CM | POA: Diagnosis not present

## 2016-02-21 NOTE — Progress Notes (Signed)
Name: Robert Short   MRN: [id number]    DOB: May 18, 1950   Date:02/21/2016       Progress Note  Subjective  Chief Complaint  Chief Complaint  Patient presents with  . Hypertension    HPI Here for f/u of HBP.  Taking meds and doing fair.  Has metastatic prostate cancer and had MI with angioplasty with stint.  He has had rare minimal chest pain.  Sees Oncology and Dr. [name]   No problem-specific Assessment & Plan notes found for this encounter.   Past Medical History:  Diagnosis Date  . ASCVD (arteriosclerotic cardiovascular disease)   . Back pain   . Back pain 10/09/2012  . Bone cancer ([place])   . Cancer associated pain   . Depression   . History of kidney stones   . Hypertension   . Joint pain   . Prostate cancer ([place])   . Right arm pain 01/10/2016  . Right foot pain 01/10/2016  . Right leg pain 01/10/2016  . SOB (shortness of breath) 10/09/2012  . Unable to ambulate 10/09/2012    Past Surgical History:  Procedure Laterality Date  . CARD[facility]AC CAT[facility]TERIZATION     [facility]  . CARDIAC CATHETERIZATION N/A 08/16/2015   Procedure: Left Heart Cath and Coronary Angiography;  Surgeon: Yolonda Kida, MD;  Location: [place] [facility];  Service: Cardiovascular;  Laterality: N/A;  . CARDIAC CATHETERIZATION N/A 08/16/2015   Procedure: Coronary Stent Intervention;  Surgeon: Yolonda Kida, MD;  Location: [place] [facility];  Service: Cardiovascular;  Laterality: N/A;  . CERVIX SURGERY    . PROSTATECTOMY    . SPINE SURGERY    . TONSILLECTOMY    . WRIST SURGERY      Family History  Problem Relation Age of Onset  . Heart attack Mother   . Hypertension Mother   . Heart attack Father     Social History   Social History  . Marital status: Married    Spouse name: N/A  . Number of children: N/A  . Years of education: N/A   Occupational History  . Not on file.   Social History Main Topics  . Smoking status: Former Smoker    Packs/day: 1.00    Years: 40.00    Types:  Cigarettes  . Smokeless tobacco: Never Used     Comment: "I use a pack a day in 24 hours. I have quit and started in past."  . Alcohol use No  . Drug use: No  . Sexual activity: Not on file   Other Topics Concern  . Not on file   Social History Narrative  . No narrative on file     Current Outpatient Prescriptions:  .  acetaminophen (TYLENOL) 500 MG tablet, Take 1,500 mg by mouth every 8 (eight) hours as needed. , Disp: , Rfl:  .  aspirin 81 MG tablet, Take 81 mg by mouth daily., Disp: , Rfl:  .  baclofen (LIORESAL) 20 MG tablet, TAKE 1 TABLET BY MOUTH TWICE DAILY, Disp: 60 tablet, Rfl: 12 .  fentaNYL (DURAGESIC - DOSED MCG/HR) 100 MCG/HR, Place 1 patch (100 mcg total) onto the skin every 3 (three) days., Disp: 5 patch, Rfl: 0 .  metoprolol (LOPRESSOR) 50 MG tablet, TAKE 1 TABLET BY MOUTH 2 TIMES DAILY., Disp: 180 tablet, Rfl: 3 .  Oxycodone HCl 10 MG TABS, One pill every 8 hours as needed for pain, Disp: 90 tablet, Rfl: 0 .  predniSONE (DELTASONE) 5 MG tablet, Take  5 mg by mouth 2 (two) times daily with a meal., Disp: , Rfl:  .  promethazine (PHENERGAN) 12.5 MG tablet, Take 1-2 tablets (12.5-25 mg total) by mouth every 8 (eight) hours as needed for nausea or vomiting., Disp: 30 tablet, Rfl: 0 .  rosuvastatin (CRESTOR) 20 MG tablet, Take 1 tablet (20 mg total) by mouth daily., Disp: 90 tablet, Rfl: 3 .  ticagrelor (BRILINTA) 90 MG TABS tablet, Take 1 tablet (90 mg total) by mouth 2 (two) times daily., Disp: 60 tablet, Rfl: 12 .  Wheat Dextrin (BENEFIBER) POWD, Stir 2 tsp. TID into 4-8 oz of any non-carbonated beverage or soft food (hot or cold), Disp: 500 g, Rfl: PRN .  abiraterone Acetate (ZYTIGA) 250 MG tablet, Take 4 tablets (1,000 mg total) by mouth daily. Take on an empty stomach 1 hour before or 2 hours after a meal (Patient not taking: Reported on 02/08/2016), Disp: 120 tablet, Rfl: 0  Not on File   Review of Systems  Constitutional: Negative for chills, fever, malaise/fatigue  and weight loss.  HENT: Negative for hearing loss and tinnitus.   Eyes: Negative for blurred vision and double vision.  Respiratory: Negative for cough, shortness of breath and wheezing.   Cardiovascular: Positive for chest pain (rare). Negative for palpitations and leg swelling.  Gastrointestinal: Negative for abdominal pain, blood in stool, heartburn and nausea.  Genitourinary: Negative for dysuria, frequency and urgency.  Musculoskeletal: Positive for joint pain. Negative for myalgias.       Multiple areas of pain due to metastatic prostate disease.  Skin: Negative for rash.  Neurological: Negative for weakness.      Objective  Vitals:   02/21/16 1348 02/21/16 1420  BP: (!) 148/86 135/70  Pulse: 66 60  Resp: 16   Temp: 98 F (36.7 C)   TempSrc: Oral   Weight: 194 lb (88 kg)   Height: _0  (1.803 m)     Physical Exam  Constitutional: He is oriented to person, place, and time and well-developed, well-nourished, and in no distress. No distress.  HENT:  Head: Normocephalic and atraumatic.  Eyes: Conjunctivae and EOM are normal. Pupils are equal, round, and reactive to light. No scleral icterus.  Neck: Normal range of motion. Neck supple. Carotid bruit is not present. No thyromegaly present.  Cardiovascular: Normal rate, regular rhythm and normal heart sounds.  Exam reveals no gallop and no friction rub.   No murmur heard. Pulmonary/Chest: Effort normal and breath sounds normal. He has no wheezes. He has no rales.  Musculoskeletal: He exhibits no edema.  Lymphadenopathy:    He has no cervical adenopathy.  Neurological: He is alert and oriented to person, place, and time.  Vitals reviewed.      Recent Results (from the past 2160 hour(s))  CBC with Differential     Status: Abnormal   Collection Time: 12/18/15  3:47 AM  Result Value Ref Range   WBC 11.5 (H) 3.8 - 10.6 K/uL   RBC 3.76 (L) 4.40 - 5.90 MIL/uL   Hemoglobin 12.3 (L) 13.0 - 18.0 g/dL   HCT 35.3 (L) 40.0  - 52.0 %   MCV 93.8 80.0 - 100.0 fL   MCH 32.6 26.0 - 34.0 pg   MCHC 34.8 32.0 - 36.0 g/dL   RDW 13.9 11.5 - 14.5 %   Platelets 127 (L) 150 - 440 K/uL   Neutrophils Relative % 87 %   Neutro Abs 10.0 (H) 1.4 - 6.5 K/uL   Lymphocytes Relative 4 %  Lymphs Abs 0.4 (L) 1.0 - 3.6 K/uL   Monocytes Relative 8 %   Monocytes Absolute 1.0 0.2 - 1.0 K/uL   Eosinophils Relative 1 %   Eosinophils Absolute 0.1 0 - 0.7 K/uL   Basophils Relative 0 %   Basophils Absolute 0.0 0 - 0.1 K/uL  Comprehensive metabolic panel     Status: Abnormal   Collection Time: 12/18/15  3:47 AM  Result Value Ref Range   Sodium 136 135 - 145 mmol/L   Potassium 3.8 3.5 - 5.1 mmol/L   Chloride 105 101 - 111 mmol/L   CO2 22 22 - 32 mmol/L   Glucose, Bld 161 (H) 65 - 99 mg/dL   BUN 13 6 - 20 mg/dL   Creatinine, Ser 0.79 0.61 - 1.24 mg/dL   Calcium 9.1 8.9 - 10.3 mg/dL   Total Protein 6.8 6.5 - 8.1 g/dL   Albumin 3.9 3.5 - 5.0 g/dL   AST 30 15 - 41 U/L   ALT 26 17 - 63 U/L   Alkaline Phosphatase 61 38 - 126 U/L   Total Bilirubin 0.8 0.3 - 1.2 mg/dL   GFR calc non Af Amer >60 >60 mL/min   GFR calc Af Amer >60 >60 mL/min    Comment: (NOTE) The eGFR has been calculated using the CKD EPI equation. This calculation has not been validated in all clinical situations. eGFR's persistently <60 mL/min signify possible Chronic Kidney Disease.    Anion gap 9 5 - 15  Lipase, blood     Status: Abnormal   Collection Time: 12/18/15  3:47 AM  Result Value Ref Range   Lipase 10 (L) 11 - 51 U/L  Troponin I     Status: None   Collection Time: 12/18/15  3:47 AM  Result Value Ref Range   Troponin I <0.03 <0.03 ng/mL  CK     Status: None   Collection Time: 12/18/15  3:47 AM  Result Value Ref Range   Total CK 51 49 - 397 U/L  CBC with Differential     Status: Abnormal   Collection Time: 01/04/16  1:25 PM  Result Value Ref Range   WBC 4.4 3.8 - 10.6 K/uL   RBC 4.06 (L) 4.40 - 5.90 MIL/uL   Hemoglobin 13.0 13.0 - 18.0 g/dL    HCT 38.5 (L) 40.0 - 52.0 %   MCV 94.7 80.0 - 100.0 fL   MCH 32.0 26.0 - 34.0 pg   MCHC 33.8 32.0 - 36.0 g/dL   RDW 13.7 11.5 - 14.5 %   Platelets 174 150 - 440 K/uL   Neutrophils Relative % 63 %   Neutro Abs 2.8 1.4 - 6.5 K/uL   Lymphocytes Relative 20 %   Lymphs Abs 0.9 (L) 1.0 - 3.6 K/uL   Monocytes Relative 13 %   Monocytes Absolute 0.6 0.2 - 1.0 K/uL   Eosinophils Relative 3 %   Eosinophils Absolute 0.1 0 - 0.7 K/uL   Basophils Relative 1 %   Basophils Absolute 0.0 0 - 0.1 K/uL  Comprehensive metabolic panel     Status: Abnormal   Collection Time: 01/04/16  1:25 PM  Result Value Ref Range   Sodium 137 135 - 145 mmol/L   Potassium 4.9 3.5 - 5.1 mmol/L   Chloride 102 101 - 111 mmol/L   CO2 27 22 - 32 mmol/L   Glucose, Bld 124 (H) 65 - 99 mg/dL   BUN 14 6 - 20 mg/dL   Creatinine, Ser 0.90 0.61 -  1.24 mg/dL   Calcium 9.8 8.9 - 10.3 mg/dL   Total Protein 6.9 6.5 - 8.1 g/dL   Albumin 4.4 3.5 - 5.0 g/dL   AST 17 15 - 41 U/L   ALT 17 17 - 63 U/L   Alkaline Phosphatase 44 38 - 126 U/L   Total Bilirubin 0.4 0.3 - 1.2 mg/dL   GFR calc non Af Amer >60 >60 mL/min   GFR calc Af Amer >60 >60 mL/min    Comment: (NOTE) The eGFR has been calculated using the CKD EPI equation. This calculation has not been validated in all clinical situations. eGFR's persistently <60 mL/min signify possible Chronic Kidney Disease.    Anion gap 8 5 - 15  PSA     Status: Abnormal   Collection Time: 01/04/16  1:25 PM  Result Value Ref Range   PSA 6.79 (H) 0.00 - 4.00 ng/mL    Comment: (NOTE) While PSA levels of <=4.0 ng/ml are reported as reference range, some men with levels below 4.0 ng/ml can have prostate cancer and many men with PSA above 4.0 ng/ml do not have prostate cancer.  Other tests such as free PSA, age specific reference ranges, PSA velocity and PSA doubling time may be helpful especially in men less than 53 years old. Performed at [facility]   Urinalysis, Complete w  Microscopic     Status: Abnormal   Collection Time: 01/11/16  2:33 PM  Result Value Ref Range   Color, Urine YELLOW YELLOW   APPearance HAZY (A) CLEAR   Specific Gravity, Urine 1.015 1.005 - 1.030   pH 6.0 5.0 - 8.0   Glucose, UA NEGATIVE NEGATIVE mg/dL   Hgb urine dipstick NEGATIVE NEGATIVE   Bilirubin Urine NEGATIVE NEGATIVE   Ketones, ur NEGATIVE NEGATIVE mg/dL   Protein, ur 30 (A) NEGATIVE mg/dL   Nitrite NEGATIVE NEGATIVE   Leukocytes, UA NEGATIVE NEGATIVE   Squamous Epithelial / LPF 0-5 (A) NONE SEEN   WBC, UA 0-5 0 - 5 WBC/hpf   RBC / HPF 0-5 0 - 5 RBC/hpf   Bacteria, UA FEW (A) NONE SEEN   Mucous PRESENT    Ca Oxalate Crys, UA PRESENT   CBC with Differential     Status: Abnormal   Collection Time: 02/03/16  1:51 PM  Result Value Ref Range   WBC 5.3 3.8 - 10.6 K/uL   RBC 3.84 (L) 4.40 - 5.90 MIL/uL   Hemoglobin 12.0 (L) 13.0 - 18.0 g/dL   HCT 36.0 (L) 40.0 - 52.0 %   MCV 93.6 80.0 - 100.0 fL   MCH 31.3 26.0 - 34.0 pg   MCHC 33.4 32.0 - 36.0 g/dL   RDW 14.1 11.5 - 14.5 %   Platelets 185 150 - 440 K/uL   Neutrophils Relative % 65 %   Neutro Abs 3.5 1.4 - 6.5 K/uL   Lymphocytes Relative 18 %   Lymphs Abs 0.9 (L) 1.0 - 3.6 K/uL   Monocytes Relative 13 %   Monocytes Absolute 0.7 0.2 - 1.0 K/uL   Eosinophils Relative 3 %   Eosinophils Absolute 0.2 0 - 0.7 K/uL   Basophils Relative 1 %   Basophils Absolute 0.1 0 - 0.1 K/uL  Basic metabolic panel     Status: Abnormal   Collection Time: 02/03/16  1:51 PM  Result Value Ref Range   Sodium 137 135 - 145 mmol/L   Potassium 4.3 3.5 - 5.1 mmol/L   Chloride 107 101 - 111 mmol/L  CO2 23 22 - 32 mmol/L   Glucose, Bld 124 (H) 65 - 99 mg/dL   BUN 12 6 - 20 mg/dL   Creatinine, Ser 0.95 0.61 - 1.24 mg/dL   Calcium 9.2 8.9 - 10.3 mg/dL   GFR calc non Af Amer >60 >60 mL/min   GFR calc Af Amer >60 >60 mL/min    Comment: (NOTE) The eGFR has been calculated using the CKD EPI equation. This calculation has not been validated in  all clinical situations. eGFR's persistently <60 mL/min signify possible Chronic Kidney Disease.    Anion gap 7 5 - 15  PSA     Status: Abnormal   Collection Time: 02/03/16  1:51 PM  Result Value Ref Range   PSA 10.47 (H) 0.00 - 4.00 ng/mL    Comment: (NOTE) While PSA levels of <=4.0 ng/ml are reported as reference range, some men with levels below 4.0 ng/ml can have prostate cancer and many men with PSA above 4.0 ng/ml do not have prostate cancer.  Other tests such as free PSA, age specific reference ranges, PSA velocity and PSA doubling time may be helpful especially in men less than 67 years old. Performed at [facility], [place] [address], [place], [place] [address]      Assessment & Plan  Problem List Items Addressed This Visit      Cardiovascular and Mediastinum   CAD (coronary artery disease)   Relevant Orders   Lipid Profile   Essential (primary) hypertension - Primary   Relevant Orders   CBC with Differential   Comprehensive Metabolic Panel (CMET)   STEMI (ST elevation myocardial infarction) ([place])     Musculoskeletal and Integument   Prostate cancer metastatic to bone (HCC) (Chronic)     Other   Chronic pain (Chronic)   Long term current use of opiate analgesic (Chronic)      No orders of the defined types were placed in this encounter.  1. Essential (primary) hypertension Cont Metoprolol - CBC with Differential - Comprehensive Metabolic Panel (CMET)  2. Coronary artery disease involving native coronary artery of native heart without angina pectoris Cont Cardiology - Lipid Profile  3. ST elevation myocardial infarction involving left anterior descending (LAD) coronary artery ([place])   4. Prostate cancer metastatic to bone [facility]) Cont Oncology  5. Chronic pain due to neoplasm   6. Long term current use of opiate analgesic

## 2016-02-22 ENCOUNTER — Other Ambulatory Visit: Payer: Self-pay | Admitting: *Deleted

## 2016-02-22 DIAGNOSIS — G893 Neoplasm related pain (acute) (chronic): Secondary | ICD-10-CM

## 2016-02-22 DIAGNOSIS — C7951 Secondary malignant neoplasm of bone: Principal | ICD-10-CM

## 2016-02-22 DIAGNOSIS — C61 Malignant neoplasm of prostate: Secondary | ICD-10-CM

## 2016-02-22 MED ORDER — FENTANYL 100 MCG/HR TD PT72: 100.0000 ug | MEDICATED_PATCH | TRANSDERMAL | 0 refills | Status: DC

## 2016-02-23 ENCOUNTER — Other Ambulatory Visit
Admission: RE | Admit: 2016-02-23 | Discharge: 2016-02-23 | Disposition: A | Discharge status: Home / Self Care | Payer: 59 | Source: Ambulatory Visit | Attending: Family Medicine | Admitting: Family Medicine

## 2016-02-23 DIAGNOSIS — I10 Essential (primary) hypertension: Secondary | ICD-10-CM | POA: Insufficient documentation

## 2016-02-23 DIAGNOSIS — I2510 Atherosclerotic heart disease of native coronary artery without angina pectoris: Secondary | ICD-10-CM | POA: Insufficient documentation

## 2016-02-23 LAB — CBC WITH DIFFERENTIAL/PLATELET
Basophils Absolute: 0 10*3/uL (ref 0–0.1)
Basophils Relative: 1 %
EOS ABS: 0.1 10*3/uL (ref 0–0.7)
EOS PCT: 2 %
HCT: 37 % — ABNORMAL LOW (ref 40.0–52.0)
Hemoglobin: 12.3 g/dL — ABNORMAL LOW (ref 13.0–18.0)
LYMPHS ABS: 0.8 10*3/uL — AB (ref 1.0–3.6)
LYMPHS PCT: 22 %
MCH: 31.2 pg (ref 26.0–34.0)
MCHC: 33.3 g/dL (ref 32.0–36.0)
MCV: 93.8 fL (ref 80.0–100.0)
MONO ABS: 0.4 10*3/uL (ref 0.2–1.0)
MONOS PCT: 11 %
Neutro Abs: 2.3 10*3/uL (ref 1.4–6.5)
Neutrophils Relative %: 64 %
PLATELETS: 163 10*3/uL (ref 150–440)
RBC: 3.95 MIL/uL — AB (ref 4.40–5.90)
RDW: 14.7 % — ABNORMAL HIGH (ref 11.5–14.5)
WBC: 3.7 10*3/uL — AB (ref 3.8–10.6)

## 2016-02-23 LAB — COMPREHENSIVE METABOLIC PANEL
ALT: 13 U/L — AB (ref 17–63)
ANION GAP: 8 (ref 5–15)
AST: 15 U/L (ref 15–41)
Albumin: 4.1 g/dL (ref 3.5–5.0)
Alkaline Phosphatase: 33 U/L — ABNORMAL LOW (ref 38–126)
BUN: 13 mg/dL (ref 6–20)
CALCIUM: 8.9 mg/dL (ref 8.9–10.3)
CHLORIDE: 105 mmol/L (ref 101–111)
CO2: 26 mmol/L (ref 22–32)
CREATININE: 0.75 mg/dL (ref 0.61–1.24)
Glucose, Bld: 99 mg/dL (ref 65–99)
Potassium: 4.1 mmol/L (ref 3.5–5.1)
SODIUM: 139 mmol/L (ref 135–145)
Total Bilirubin: 0.4 mg/dL (ref 0.3–1.2)
Total Protein: 6.6 g/dL (ref 6.5–8.1)

## 2016-02-23 LAB — LIPID PANEL
CHOLESTEROL: 217 mg/dL — AB (ref 0–200)
HDL: 58 mg/dL (ref >40)
LDL Cholesterol: 118 mg/dL — ABNORMAL HIGH (ref 0–99)
TRIGLYCERIDES: 204 mg/dL — AB (ref <150)
Total CHOL/HDL Ratio: 3.7 RATIO
VLDL: 41 mg/dL — AB (ref 0–40)

## 2016-02-24 ENCOUNTER — Telehealth: Payer: Self-pay | Admitting: *Deleted

## 2016-02-24 NOTE — Telephone Encounter (Signed)
Lab results:  CBC low due to cancer treatment. Lipids: up a little but not high enough to treat. CMET: ok liver enzymes down a little secondary to cancer treatment.

## 2016-03-03 ENCOUNTER — Inpatient Hospital Stay: Payer: 59 | Attending: Internal Medicine

## 2016-03-03 DIAGNOSIS — C61 Malignant neoplasm of prostate: Secondary | ICD-10-CM | POA: Insufficient documentation

## 2016-03-03 DIAGNOSIS — Z955 Presence of coronary angioplasty implant and graft: Secondary | ICD-10-CM | POA: Insufficient documentation

## 2016-03-03 DIAGNOSIS — Z923 Personal history of irradiation: Secondary | ICD-10-CM | POA: Diagnosis not present

## 2016-03-03 DIAGNOSIS — Z7982 Long term (current) use of aspirin: Secondary | ICD-10-CM | POA: Diagnosis not present

## 2016-03-03 DIAGNOSIS — Z79818 Long term (current) use of other agents affecting estrogen receptors and estrogen levels: Secondary | ICD-10-CM | POA: Insufficient documentation

## 2016-03-03 DIAGNOSIS — Z87442 Personal history of urinary calculi: Secondary | ICD-10-CM | POA: Diagnosis not present

## 2016-03-03 DIAGNOSIS — Z87891 Personal history of nicotine dependence: Secondary | ICD-10-CM | POA: Insufficient documentation

## 2016-03-03 DIAGNOSIS — Z79899 Other long term (current) drug therapy: Secondary | ICD-10-CM | POA: Insufficient documentation

## 2016-03-03 DIAGNOSIS — K5900 Constipation, unspecified: Secondary | ICD-10-CM | POA: Insufficient documentation

## 2016-03-03 DIAGNOSIS — Z9079 Acquired absence of other genital organ(s): Secondary | ICD-10-CM | POA: Diagnosis not present

## 2016-03-03 DIAGNOSIS — I10 Essential (primary) hypertension: Secondary | ICD-10-CM | POA: Diagnosis not present

## 2016-03-03 DIAGNOSIS — C7951 Secondary malignant neoplasm of bone: Secondary | ICD-10-CM | POA: Diagnosis not present

## 2016-03-03 DIAGNOSIS — I2510 Atherosclerotic heart disease of native coronary artery without angina pectoris: Secondary | ICD-10-CM | POA: Insufficient documentation

## 2016-03-03 LAB — CBC WITH DIFFERENTIAL/PLATELET
BASOS ABS: 0 10*3/uL (ref 0–0.1)
Basophils Relative: 0 %
Eosinophils Absolute: 0.1 10*3/uL (ref 0–0.7)
Eosinophils Relative: 1 %
HEMATOCRIT: 37.6 % — AB (ref 40.0–52.0)
HEMOGLOBIN: 12.4 g/dL — AB (ref 13.0–18.0)
Lymphocytes Relative: 13 %
Lymphs Abs: 0.8 10*3/uL — ABNORMAL LOW (ref 1.0–3.6)
MCH: 31.2 pg (ref 26.0–34.0)
MCHC: 32.9 g/dL (ref 32.0–36.0)
MCV: 94.7 fL (ref 80.0–100.0)
MONOS PCT: 8 %
Monocytes Absolute: 0.5 10*3/uL (ref 0.2–1.0)
NEUTROS ABS: 4.9 10*3/uL (ref 1.4–6.5)
NEUTROS PCT: 78 %
Platelets: 156 10*3/uL (ref 150–440)
RBC: 3.97 MIL/uL — AB (ref 4.40–5.90)
RDW: 14.7 % — ABNORMAL HIGH (ref 11.5–14.5)
WBC: 6.3 10*3/uL (ref 3.8–10.6)

## 2016-03-06 ENCOUNTER — Other Ambulatory Visit: Payer: Self-pay | Admitting: *Deleted

## 2016-03-06 DIAGNOSIS — Z923 Personal history of irradiation: Secondary | ICD-10-CM | POA: Diagnosis not present

## 2016-03-06 DIAGNOSIS — Z87442 Personal history of urinary calculi: Secondary | ICD-10-CM | POA: Diagnosis not present

## 2016-03-06 DIAGNOSIS — K5900 Constipation, unspecified: Secondary | ICD-10-CM | POA: Diagnosis not present

## 2016-03-06 DIAGNOSIS — C7951 Secondary malignant neoplasm of bone: Principal | ICD-10-CM

## 2016-03-06 DIAGNOSIS — Z955 Presence of coronary angioplasty implant and graft: Secondary | ICD-10-CM | POA: Diagnosis not present

## 2016-03-06 DIAGNOSIS — I2510 Atherosclerotic heart disease of native coronary artery without angina pectoris: Secondary | ICD-10-CM | POA: Diagnosis not present

## 2016-03-06 DIAGNOSIS — C61 Malignant neoplasm of prostate: Secondary | ICD-10-CM | POA: Diagnosis not present

## 2016-03-06 DIAGNOSIS — I10 Essential (primary) hypertension: Secondary | ICD-10-CM | POA: Diagnosis not present

## 2016-03-06 DIAGNOSIS — Z79818 Long term (current) use of other agents affecting estrogen receptors and estrogen levels: Secondary | ICD-10-CM | POA: Diagnosis not present

## 2016-03-07 ENCOUNTER — Other Ambulatory Visit: Payer: Self-pay | Admitting: *Deleted

## 2016-03-07 ENCOUNTER — Inpatient Hospital Stay: Payer: 59

## 2016-03-07 ENCOUNTER — Inpatient Hospital Stay (HOSPITAL_BASED_OUTPATIENT_CLINIC_OR_DEPARTMENT_OTHER): Payer: 59 | Admitting: Internal Medicine

## 2016-03-07 VITALS — BP 146/83 | HR 57 | Temp 98.5°F | Resp 18 | Wt 194.3 lb

## 2016-03-07 DIAGNOSIS — C61 Malignant neoplasm of prostate: Secondary | ICD-10-CM

## 2016-03-07 DIAGNOSIS — C7951 Secondary malignant neoplasm of bone: Secondary | ICD-10-CM

## 2016-03-07 DIAGNOSIS — K5900 Constipation, unspecified: Secondary | ICD-10-CM | POA: Diagnosis not present

## 2016-03-07 DIAGNOSIS — I10 Essential (primary) hypertension: Secondary | ICD-10-CM | POA: Diagnosis not present

## 2016-03-07 DIAGNOSIS — Z923 Personal history of irradiation: Secondary | ICD-10-CM

## 2016-03-07 DIAGNOSIS — Z79818 Long term (current) use of other agents affecting estrogen receptors and estrogen levels: Secondary | ICD-10-CM

## 2016-03-07 DIAGNOSIS — Z955 Presence of coronary angioplasty implant and graft: Secondary | ICD-10-CM | POA: Diagnosis not present

## 2016-03-07 DIAGNOSIS — G893 Neoplasm related pain (acute) (chronic): Secondary | ICD-10-CM

## 2016-03-07 DIAGNOSIS — I2510 Atherosclerotic heart disease of native coronary artery without angina pectoris: Secondary | ICD-10-CM | POA: Diagnosis not present

## 2016-03-07 DIAGNOSIS — Z87442 Personal history of urinary calculi: Secondary | ICD-10-CM | POA: Diagnosis not present

## 2016-03-07 LAB — BASIC METABOLIC PANEL
Anion gap: 9 (ref 5–15)
BUN: 12 mg/dL (ref 6–20)
CHLORIDE: 101 mmol/L (ref 101–111)
CO2: 23 mmol/L (ref 22–32)
CREATININE: 0.71 mg/dL (ref 0.61–1.24)
Calcium: 9 mg/dL (ref 8.9–10.3)
GFR calc Af Amer: >60 mL/min (ref >60)
GFR calc non Af Amer: >60 mL/min (ref >60)
Glucose, Bld: 97 mg/dL (ref 65–99)
Potassium: 4 mmol/L (ref 3.5–5.1)
SODIUM: 133 mmol/L — AB (ref 135–145)

## 2016-03-07 LAB — PSA: PSA: 4.97 ng/mL — ABNORMAL HIGH (ref 0.00–4.00)

## 2016-03-07 LAB — TESTOSTERONE: Testosterone: <3 ng/dL — ABNORMAL LOW (ref 264–916)

## 2016-03-07 MED ORDER — OXYCODONE HCL 10 MG PO TABS: ORAL_TABLET | ORAL | 0 refills | Status: DC

## 2016-03-07 MED ORDER — LEUPROLIDE ACETATE (3 MONTH) 22.5 MG IM KIT
22.5000 mg | PACK | Freq: Once | INTRAMUSCULAR | Status: AC
Start: 2016-03-07 — End: 2016-03-07
  Administered 2016-03-07: 22.5 mg via INTRAMUSCULAR
  Filled 2016-03-07: qty 22.5

## 2016-03-07 MED ORDER — DENOSUMAB 120 MG/1.7ML ~~LOC~~ SOLN
120.0000 mg | Freq: Once | SUBCUTANEOUS | Status: AC
  Administered 2016-03-07: 120 mg via SUBCUTANEOUS

## 2016-03-07 MED ORDER — FENTANYL 100 MCG/HR TD PT72: 100.0000 ug | MEDICATED_PATCH | TRANSDERMAL | 0 refills | Status: DC

## 2016-03-07 NOTE — Assessment & Plan Note (Signed)
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  Jan 2018- bone scan- no significant active disease. March 2018- PSA/imrpving.  On Zytiga 4 pills/prednisone BID. Continue current dose.   # X-geva/ Lupron every 3 months- on ca+vit D. No AEs noted. Due for both the shots today.   # Back pain secondary malignancy status post radiation; improved; continue current regimen- fentanyl 100 g every 72 hours; continue oxycodone 10 mg every 8-12 hours. New scripts given.   # follow up in 6 weeks/X-geva; labs- psa/- few days prior.

## 2016-03-07 NOTE — Progress Notes (Signed)
[facility] OFFICE PROGRESS NOTE  Patient Care Team: Arlis Porta., MD as PCP - General (Family Medicine) Minna Merritts, MD as Consulting Physician (Cardiology)   SUMMARY OF ONCOLOGIC HISTORY: Oncology History   # 2011- PROSTATE CANCER [Gleason 3+4]; s/p Prostatectomy [ also involved bladder neck/ECP; Dr.Polaseck]; July 2014- Biochemical recurrence [PSA 14]- started on Zoladex [Dr.Pandit]; lost to follow up.  # JAN 2017- STAGE IV METASTATIC PROSTATE Cancer to Bone- Feb 13th, 2017-  Lupron q 42m [~end of feb]; PSA: 1021; Declined Chemo; April 2017 [xofigo x6; Dr.Crystal]; AUG 2017- Zytiga + Prednisone BID. Bone scan-Jan 2018- improved skeletal metastases.   # Mets to bone- start X-geva q 73M [May 30th]  # Smoker/ chronic pain/pain clinic      Malignant neoplasm of prostate ([place])   08/01/2010 Initial Diagnosis    Malignant neoplasm of prostate [facility])       Prostate cancer metastatic to bone ([place])   12/08/2014 Initial Diagnosis    Prostate cancer metastatic to bone [facility])       INTERVAL HISTORY:   66 year old pleasant Caucasian male patient  With above history of prostate cancer metastatic castrate resistant currently on Lupron + Zytiga is here for follow-up.   Patient's pain is stable ; he continues to be on fentanyl 100 and oxycodone 10 milligrams every 8-12 hours.  Chronic mild constipation. On stool softeners. No blood in stools. No chest pain or shortness of breath or cough.  REVIEW OF SYSTEMS:  A complete 10 point review of system is done which is negative except mentioned above/history of present illness.   PAST MEDICAL HISTORY :  Past Medical History:  Diagnosis Date  . ASCVD (arteriosclerotic cardiovascular disease)   . Back pain   . Back pain 10/09/2012  . Bone cancer ([place])   . Cancer associated pain   . Depression   . History of kidney stones   . Hypertension   . Joint pain   . Prostate cancer ([place])   . Right arm pain 01/10/2016  . Right  foot pain 01/10/2016  . Right leg pain 01/10/2016  . SOB (shortness of breath) 10/09/2012  . Unable to ambulate 10/09/2012    PAST SURGICAL HISTORY :   Past Surgical History:  Procedure Laterality Date  . CARDIAC CATHETERIZATION     [facility]  . CARDIAC CATHETERIZATION N/A 08/16/2015   Procedure: Left Heart Cath and Coronary Angiography;  Surgeon: Yolonda Kida, MD;  Location: [facility];  Service: Cardiovascular;  Laterality: N/A;  . CARDIAC CATHETERIZATION N/A 08/16/2015   Procedure: Coronary Stent Intervention;  Surgeon: Yolonda Kida, MD;  Location: [facility];  Service: Cardiovascular;  Laterality: N/A;  . CERVIX SURGERY    . PROSTATECTOMY    . SPINE SURGERY    . TONSILLECTOMY    . WRIST SURGERY      FAMILY HISTORY :   Family History  Problem Relation Age of Onset  . Heart attack Mother   . Hypertension Mother   . Heart attack Father     SOCIAL HISTORY:   Social History  Substance Use Topics  . Smoking status: Former Smoker    Packs/day: 1.00    Years: 40.00    Types: Cigarettes  . Smokeless tobacco: Never Used     Comment: "I use a pack a day in 24 hours. I have quit and started in past."  . Alcohol use No    ALLERGIES:  has No Known Allergies.  MEDICATIONS:  Current Outpatient Prescriptions  Medication Sig Dispense Refill  . abiraterone Acetate (ZYTIGA) 250 MG tablet Take 4 tablets (1,000 mg total) by mouth daily. Take on an empty stomach 1 hour before or 2 hours after a meal 120 tablet 0  . acetaminophen (TYLENOL) 500 MG tablet Take 1,500 mg by mouth every 8 (eight) hours as needed.     Marland Kitchen aspirin 81 MG tablet Take 81 mg by mouth daily.    . fentaNYL (DURAGESIC - DOSED MCG/HR) 100 MCG/HR Place 1 patch (100 mcg total) onto the skin every 3 (three) days. 10 patch 0  . metoprolol (LOPRESSOR) 50 MG tablet TAKE 1 TABLET BY MOUTH 2 TIMES DAILY. 180 tablet 3  . Oxycodone HCl 10 MG TABS One pill every 8 hours as needed for pain 90 tablet 0  .  predniSONE (DELTASONE) 5 MG tablet Take 5 mg by mouth 2 (two) times daily with a meal.    . promethazine (PHENERGAN) 12.5 MG tablet Take 1-2 tablets (12.5-25 mg total) by mouth every 8 (eight) hours as needed for nausea or vomiting. 30 tablet 0  . rosuvastatin (CRESTOR) 20 MG tablet Take 1 tablet (20 mg total) by mouth daily. 90 tablet 3  . ticagrelor (BRILINTA) 90 MG TABS tablet Take 1 tablet (90 mg total) by mouth 2 (two) times daily. 60 tablet 12  . Wheat Dextrin (BENEFIBER) POWD Stir 2 tsp. TID into 4-8 oz of any non-carbonated beverage or soft food (hot or cold) 500 g PRN  . baclofen (LIORESAL) 20 MG tablet TAKE 1 TABLET BY MOUTH TWICE DAILY (Patient not taking: Reported on 03/07/2016) 60 tablet 12   No current facility-administered medications for this visit.     PHYSICAL EXAMINATION: ECOG PERFORMANCE STATUS: 1 - Symptomatic but completely ambulatory  BP (!) 146/83 (BP Location: Left Arm, Patient Position: Sitting)   Pulse (!) 57   Temp 98.5 F (36.9 C) (Tympanic)   Resp 18   Wt 194 lb 5.4 oz (88.2 kg)   BMI 27.10 kg/m   Filed Weights   03/07/16 1114  Weight: 194 lb 5.4 oz (88.2 kg)   GENERAL: Well-nourished well-developed; Alert, no distress and comfortable. He is alone. EYES: no pallor or icterus OROPHARYNX: no thrush or ulceration; Upper dentures. NECK: supple, no masses felt LYMPH: no palpable lymphadenopathy in the cervical, axillary or inguinal regions LUNGS: clear to auscultation and No wheeze or crackles HEART/CVS: regular rate & rhythm and no murmurs; No lower extremity edema ABDOMEN:abdomen soft, non-tender and normal bowel sounds; No hepatomegaly. Musculoskeletal:no cyanosis of digits and no clubbing; No significant tenderness noted. PSYCH: alert & oriented x 3 with fluent speech NEURO: no focal motor/sensory deficits SKIN: no rashes or significant lesions   LABORATORY DATA:  I have reviewed the data as listed    Component Value Date/Time   NA 133  (Short) 03/07/2016 1152   NA 140 07/13/2013 1542   K 4.0 03/07/2016 1152   K 4.3 07/13/2013 1542   CL 101 03/07/2016 1152   CL 107 07/13/2013 1542   CO2 23 03/07/2016 1152   CO2 26 07/13/2013 1542   GLUCOSE 97 03/07/2016 1152   GLUCOSE 106 (H) 07/13/2013 1542   BUN 12 03/07/2016 1152   BUN 11 07/13/2013 1542   CREATININE 0.71 03/07/2016 1152   CREATININE 1.01 07/13/2013 1542   CALCIUM 9.0 03/07/2016 1152   CALCIUM 9.9 07/13/2013 1542   PROT 6.6 02/23/2016 1211   PROT 8.1 07/13/2013 1542   ALBUMIN 4.1 02/23/2016  1211   ALBUMIN 4.3 07/13/2013 1542   AST 15 02/23/2016 1211   AST 26 07/13/2013 1542   ALT 13 (Short) 02/23/2016 1211   ALT 32 07/13/2013 1542   ALKPHOS 33 (Short) 02/23/2016 1211   ALKPHOS 74 07/13/2013 1542   BILITOT 0.4 02/23/2016 1211   BILITOT 0.4 07/13/2013 1542   GFRNONAA >60 03/07/2016 1152   GFRNONAA >60 07/13/2013 1542   GFRAA >60 03/07/2016 1152   GFRAA >60 07/13/2013 1542    No results found for: SPEP, UPEP  Lab Results  Component Value Date   WBC 6.3 03/03/2016   NEUTROABS 4.9 03/03/2016   HGB 12.4 (Short) 03/03/2016   HCT 37.6 (Short) 03/03/2016   MCV 94.7 03/03/2016   PLT 156 03/03/2016      Chemistry      Component Value Date/Time   NA 133 (Short) 03/07/2016 1152   NA 140 07/13/2013 1542   K 4.0 03/07/2016 1152   K 4.3 07/13/2013 1542   CL 101 03/07/2016 1152   CL 107 07/13/2013 1542   CO2 23 03/07/2016 1152   CO2 26 07/13/2013 1542   BUN 12 03/07/2016 1152   BUN 11 07/13/2013 1542   CREATININE 0.71 03/07/2016 1152   CREATININE 1.01 07/13/2013 1542      Component Value Date/Time   CALCIUM 9.0 03/07/2016 1152   CALCIUM 9.9 07/13/2013 1542   ALKPHOS 33 (Short) 02/23/2016 1211   ALKPHOS 74 07/13/2013 1542   AST 15 02/23/2016 1211   AST 26 07/13/2013 1542   ALT 13 (Short) 02/23/2016 1211   ALT 32 07/13/2013 1542   BILITOT 0.4 02/23/2016 1211   BILITOT 0.4 07/13/2013 1542     Results for Robert Short, Robert Short (MRN [id number]) as of 02/08/2016 11:43  Ref. Range  08/17/2004 08:38 07/10/2005 16:47 07/19/2005 11:33 07/19/2005 16:45 03/15/2006 11:24 03/16/2006 11:17 03/22/2006 11:14 04/06/2008 15:16 02/09/2010 19:08 02/09/2010 20:17 02/09/2010 20:21 07/15/2012 13:47 08/15/2012 10:21 08/15/2012 11:12 08/15/2012 11:13 08/16/2012 10:15 08/16/2012 11:09 08/21/2012 11:00 08/21/2012 11:11 08/21/2012 12:12 10/09/2012 15:38 10/16/2012 10:10 10/16/2012 11:26 12/05/2012 10:07 04/19/2013 19:44 04/19/2013 19:47 04/19/2013 20:12 05/23/2013 13:41 07/11/2013 13:17 07/13/2013 15:42 07/13/2013 16:03 07/13/2013 20:59 07/24/2013 10:01 11/03/2013 16:19 09/29/2014 13:52 12/07/2014 00:00 01/29/2015 00:00 02/01/2015 14:50 02/04/2015 10:50 02/04/2015 13:25 02/08/2015 09:52 02/11/2015 10:47 03/16/2015 10:32 04/06/2015 16:17 04/20/2015 13:28 04/27/2015 11:49 05/19/2015 13:35 05/24/2015 15:02 05/26/2015 11:18 06/01/2015 11:35 06/17/2015 12:52 06/22/2015 15:14 07/14/2015 13:25 08/10/2015 14:03 08/16/2015 16:19 08/16/2015 16:19 08/16/2015 16:19 08/16/2015 16:22 08/16/2015 16:26 08/16/2015 16:30 08/16/2015 17:06 08/16/2015 18:33 08/16/2015 21:13 08/17/2015 04:16 08/17/2015 05:16 08/17/2015 15:43 08/17/2015 20:52 08/18/2015 17:10 09/15/2015 14:25 10/05/2015 13:44 11/09/2015 00:00 11/18/2015 13:35 12/18/2015 03:47 12/18/2015 04:05 01/04/2016 13:25 01/10/2016 14:42 01/11/2016 14:33 02/03/2016 13:51  PSA Latest Ref Range: 0.00 - 4.00 ng/mL            14.2 (H) 14.6 (H)           0.2                 1,021.00 (H)     33.39 (H)    43.62 (H)   14.08 (H) 15.26 (H)                1.40  3.03   6.79 (H)   10.47 (H)    ASSESSMENT & PLAN:    Prostate cancer metastatic to bone (HCC) Castrate resistant prostate cancer-  Patient currently on Lupron androgen deprivation therapy; on Zytiga- since Aug 2017.  Jan 2018- bone scan- no significant active disease. March 2018-  PSA/imrpving.  On Zytiga 4 pills/prednisone BID. Continue current dose.   # X-geva/ Lupron every 3 months- on ca+vit D. No AEs noted. Due for both the shots today.   # Back pain secondary malignancy status post radiation; improved;  continue current regimen- fentanyl 100 g every 72 hours; continue oxycodone 10 mg every 8-12 hours. New scripts given.   # follow up in 6 weeks/X-geva; labs- psa/- few days prior.      [name], MD 03/07/2016 4:02 PM

## 2016-03-07 NOTE — Progress Notes (Signed)
Patient here today for follow up.   

## 2016-04-04 ENCOUNTER — Other Ambulatory Visit: Payer: Self-pay | Admitting: Internal Medicine

## 2016-04-04 DIAGNOSIS — I252 Old myocardial infarction: Secondary | ICD-10-CM | POA: Diagnosis not present

## 2016-04-04 DIAGNOSIS — I10 Essential (primary) hypertension: Secondary | ICD-10-CM | POA: Diagnosis not present

## 2016-04-04 DIAGNOSIS — E7800 Pure hypercholesterolemia, unspecified: Secondary | ICD-10-CM | POA: Diagnosis not present

## 2016-04-04 DIAGNOSIS — C61 Malignant neoplasm of prostate: Secondary | ICD-10-CM | POA: Diagnosis not present

## 2016-04-04 DIAGNOSIS — C7951 Secondary malignant neoplasm of bone: Secondary | ICD-10-CM | POA: Diagnosis not present

## 2016-04-04 DIAGNOSIS — M542 Cervicalgia: Secondary | ICD-10-CM | POA: Diagnosis not present

## 2016-04-04 DIAGNOSIS — I2510 Atherosclerotic heart disease of native coronary artery without angina pectoris: Secondary | ICD-10-CM | POA: Diagnosis not present

## 2016-04-04 DIAGNOSIS — R001 Bradycardia, unspecified: Secondary | ICD-10-CM | POA: Diagnosis not present

## 2016-04-04 DIAGNOSIS — Z9889 Other specified postprocedural states: Secondary | ICD-10-CM | POA: Diagnosis not present

## 2016-04-04 DIAGNOSIS — Z7982 Long term (current) use of aspirin: Secondary | ICD-10-CM | POA: Diagnosis not present

## 2016-04-04 DIAGNOSIS — I25118 Atherosclerotic heart disease of native coronary artery with other forms of angina pectoris: Secondary | ICD-10-CM | POA: Diagnosis not present

## 2016-04-04 DIAGNOSIS — G893 Neoplasm related pain (acute) (chronic): Secondary | ICD-10-CM

## 2016-04-04 DIAGNOSIS — R079 Chest pain, unspecified: Secondary | ICD-10-CM | POA: Diagnosis not present

## 2016-04-04 DIAGNOSIS — Z8546 Personal history of malignant neoplasm of prostate: Secondary | ICD-10-CM | POA: Diagnosis not present

## 2016-04-04 DIAGNOSIS — I25110 Atherosclerotic heart disease of native coronary artery with unstable angina pectoris: Secondary | ICD-10-CM | POA: Diagnosis not present

## 2016-04-04 DIAGNOSIS — F17210 Nicotine dependence, cigarettes, uncomplicated: Secondary | ICD-10-CM | POA: Diagnosis not present

## 2016-04-04 DIAGNOSIS — Z955 Presence of coronary angioplasty implant and graft: Secondary | ICD-10-CM | POA: Diagnosis not present

## 2016-04-04 DIAGNOSIS — R0789 Other chest pain: Secondary | ICD-10-CM | POA: Diagnosis not present

## 2016-04-04 DIAGNOSIS — I200 Unstable angina: Secondary | ICD-10-CM | POA: Diagnosis not present

## 2016-04-05 MED ORDER — OXYCODONE HCL 10 MG PO TABS: ORAL_TABLET | ORAL | 0 refills | Status: DC

## 2016-04-05 MED ORDER — FENTANYL 100 MCG/HR TD PT72: 100.0000 ug | MEDICATED_PATCH | TRANSDERMAL | 0 refills | Status: DC

## 2016-04-12 ENCOUNTER — Encounter: Payer: Self-pay | Admitting: Family Medicine

## 2016-04-12 ENCOUNTER — Ambulatory Visit (INDEPENDENT_AMBULATORY_CARE_PROVIDER_SITE_OTHER): Payer: 59 | Admitting: Family Medicine

## 2016-04-12 VITALS — BP 142/82 | HR 90 | Temp 98.3°F | Resp 16 | Ht 71.0 in | Wt 193.0 lb

## 2016-04-12 DIAGNOSIS — I2510 Atherosclerotic heart disease of native coronary artery without angina pectoris: Secondary | ICD-10-CM

## 2016-04-12 DIAGNOSIS — I10 Essential (primary) hypertension: Secondary | ICD-10-CM | POA: Diagnosis not present

## 2016-04-12 DIAGNOSIS — I200 Unstable angina: Secondary | ICD-10-CM

## 2016-04-12 NOTE — Assessment & Plan Note (Signed)
Resolved, s/p PCI stenting with recent hospitalization

## 2016-04-12 NOTE — Patient Instructions (Signed)
Thank you for coming in to clinic today.  1. Elevated BP initially - As discussed likely off Metoprolol this is causing BP to be higher now, in past you were on Amlodipine and Benicar  I do recommend resuming some form of blood pressure med. Given the recent heart problems with bradycardia or slow heart rate and discussions about possible pacemaker, I am not going to resume the Metoprolol at this time. Beta blockers like this med are good after heart attacks or blockages, so this may need to be addressed going forward.  Also, I would recommend maybe resuming the Benicar or ARB medicine for BP, these can also be good to prevent future heart and kidney damage from high blood pressure. This would not slow down your heart rate.  Please review these medication questions with Dr [name] at your next visit  Continue current plan and medications from hospital. Agree with Lipitor 80mg , if you get significant muscle aches or joint pains, then we may need to adjust this dose down or switch back to crestor, but this one is the strongest  Increased sensitivity to temperature is likely due to medications.  Please schedule a follow-up appointment with Dr. Parks Ranger in 1-3 months for HTN  If you have any other questions or concerns, please feel free to call the clinic or send a message through [place]. You may also schedule an earlier appointment if necessary.  Nobie Putnam, DO [place]

## 2016-04-12 NOTE — Assessment & Plan Note (Addendum)
Recent worsening with unstable angina in setting of known chronic CAD s/p multiple PCI stenting, prior NSTEMI  Plan: 1. Follow-up with established Cardiology Dr [name] to review recent cardiac related hospitalization, especially to review the following changes: - Switched from [place] to Plavix due to concerns of bradycardia while in hospital, remains on Plavix + ASA 81 currently - Currently holding Metoprolol from hospitalization with severe symptomatic bradycardia, seems improved off of it, unclear if secondary to brillinta - On high intensity statin, now lipitor 80, previously crestor - Not on ACEi/ARB, previously on Benicar held prior due to lower BP

## 2016-04-12 NOTE — Assessment & Plan Note (Addendum)
BP is reasonable currently, HR 90s, no longer bradycardic, has done better off Brillinta and metoprolol it seems - Not on ACEi/ARB, previously on Benicar held prior due to lower BP  Plan: 1. Given less than 1 week since acute hospitalization, and BP / HR remains stable, will avoid major change today, will defer this med decision to patient's established Cardiology Dr [name] on 04/21/16 in about 1 more week. Discussion with patient today that given his significant cardiac disease, beta blockers are recommended and are important to reduce future risk, however caution in bradycardia, and recommended continue to hold for now. Also he may benefit from resuming prior ARB therapy given cardiac disease for BP control if he is no longer candidate for BB.

## 2016-04-12 NOTE — Progress Notes (Signed)
Subjective:    Patient ID: Robert Short, male    DOB: 23-Apr-1950, 66 y.o.   MRN: [id number]  Robert Short is a 66 y.o. male presenting on 04/12/2016 for Hospitalization Follow-up   HPI   Hospital Follow-up - 04/04/16 (admitted), 04/07/16 (discharged)  Unstable Angina with CAD S/p PCI with stent placement / Symptomatic Bradycardia: - Here today for hospital follow-up, from outside hospitalization, records available in Epic through [facility] from [place], H&P and Discharge Summary were reviewed prior. Briefly, while traveling home from from visiting family out of state ([place]) and while in [place], he experienced similar symptoms with his last heart attack with neck pain radiating to both arms and chest, initial concerns in ED was with severe bradycardia with reported HR in 30s and a 2.5 second pause, his beta blocker was held, and he was on continuous cardiac monitoring, troponins were negative, Cardiology followed inpatient and ultimately a cardiac cath was done, found 95% stenosis proximal to existing stent in branch of RCA, and new stent was placed. Additionally it was thought that his bradycardia may have been related to a side effect from [place], he was switched to Plavix, and continued on Aspirin. Metoprolol was held. He states there was concern for a potential need for pacemaker at the time. - Patient also reviews significant known cardiac history from 08/2015 hospitalization with STEMI with initial PCI and stent placement - Currently patient reports he is doing much better 1 week later. Still feels some residual weakness or fatigue at times, also admits to feeling generalized sensitivity to cold more recently than he used to before. - Additionally he was started on Lipitor 80mg  daily, in past he had been on Crestor but admits he incorrectly took it several times a day and resulted in myalgias, which is why he stopped it - He has an appointment with current Cardiologist, Dr [name] on  04/21/16  CHRONIC HTN: Reports checks at home, 136/79, seems to fluctuate, 140s/80s without significant elevated readings Current Meds - none - holding Metoprolol 50mg  BID after recent hospitalization with bradycardia - In the past he admits to taking Amlodipine, Benicar as well but these were stopped due to lower BP, but no known problems with these medications Denies CP, dyspnea, HA, edema, dizziness / lightheadedness   Social History  Substance Use Topics  . Smoking status: Former Smoker    Packs/day: 1.00    Years: 40.00    Types: Cigarettes  . Smokeless tobacco: Never Used     Comment: "I use a pack a day in 24 hours. I have quit and started in past."  . Alcohol use No    Review of Systems Per HPI unless specifically indicated above     Objective:    BP (!) 142/82 (BP Location: Left Arm, Cuff Size: Normal)   Pulse 90   Temp 98.3 F (36.8 C) (Oral)   Resp 16   Ht 5\' 11"  (1.803 m)   Wt 193 lb (87.5 kg)   BMI 26.92 kg/m     Wt Readings from Last 3 Encounters:  04/12/16 193 lb (87.5 kg)  03/07/16 194 lb 5.4 oz (88.2 kg)  02/21/16 194 lb (88 kg)    Physical Exam  Constitutional: He is oriented to person, place, and time. He appears well-developed and well-nourished. No distress.  HENT:  Head: Normocephalic and atraumatic.  Mouth/Throat: Oropharynx is clear and moist.  Eyes: Conjunctivae are normal.  Neck: Normal range of motion. Neck supple. No thyromegaly  present.  No carotid bruits  Cardiovascular: Normal rate, regular rhythm, normal heart sounds and intact distal pulses.   No murmur heard. Pulmonary/Chest: Effort normal. No respiratory distress. He has no wheezes. He has no rales.  Musculoskeletal: He exhibits no edema.  Lymphadenopathy:    He has no cervical adenopathy.  Neurological: He is alert and oriented to person, place, and time.  Skin: Skin is warm and dry. No rash noted. He is not diaphoretic. No erythema.  Psychiatric: He has a normal mood and  affect. His behavior is normal.  Nursing note and vitals reviewed.     I have personally reviewed the following lab results from 03/07/16.  Results for orders placed or performed in visit on 76/72/09  Basic metabolic panel  Result Value Ref Range   Sodium 133 (L) 135 - 145 mmol/L   Potassium 4.0 3.5 - 5.1 mmol/L   Chloride 101 101 - 111 mmol/L   CO2 23 22 - 32 mmol/L   Glucose, Bld 97 65 - 99 mg/dL   BUN 12 6 - 20 mg/dL   Creatinine, Ser 0.71 0.61 - 1.24 mg/dL   Calcium 9.0 8.9 - 10.3 mg/dL   GFR calc non Af Amer >60 >60 mL/min   GFR calc Af Amer >60 >60 mL/min   Anion gap 9 5 - 15      Assessment & Plan:   Problem List Items Addressed This Visit    RESOLVED: Unstable angina (HCC)    Resolved, s/p PCI stenting with recent hospitalization      Relevant Medications   atorvastatin (LIPITOR) 80 MG tablet   nitroGLYCERIN (NITROSTAT) 0.4 MG SL tablet   Essential (primary) hypertension - Primary    BP is reasonable currently, HR 90s, no longer bradycardic, has done better off Brillinta and metoprolol it seems - Not on ACEi/ARB, previously on Benicar held prior due to lower BP  Plan: 1. Given less than 1 week since acute hospitalization, and BP / HR remains stable, will avoid major change today, will defer this med decision to patient's established Cardiology Dr [name] on 04/21/16 in about 1 more week. Discussion with patient today that given his significant cardiac disease, beta blockers are recommended and are important to reduce future risk, however caution in bradycardia, and recommended continue to hold for now. Also he may benefit from resuming prior ARB therapy given cardiac disease for BP control if he is no longer candidate for BB.      Relevant Medications   atorvastatin (LIPITOR) 80 MG tablet   nitroGLYCERIN (NITROSTAT) 0.4 MG SL tablet   CAD (coronary artery disease)    Recent worsening with unstable angina in setting of known chronic CAD s/p multiple PCI stenting, prior  NSTEMI  Plan: 1. Follow-up with established Cardiology Dr [name] to review recent cardiac related hospitalization, especially to review the following changes: - Switched from [place] to Plavix due to concerns of bradycardia while in hospital, remains on Plavix + ASA 81 currently - Currently holding Metoprolol from hospitalization with severe symptomatic bradycardia, seems improved off of it, unclear if secondary to brillinta - On high intensity statin, now lipitor 80, previously crestor - Not on ACEi/ARB, previously on Benicar held prior due to lower BP      Relevant Medications   atorvastatin (LIPITOR) 80 MG tablet   nitroGLYCERIN (NITROSTAT) 0.4 MG SL tablet       Follow up plan: Return in about 3 months (around 07/12/2016) for blood pressure.  Strict return criteria given  to return to [facility] ED if any recurrence of acute cardiac symptoms.  Nobie Putnam, [facility] 04/12/2016, 10:21 PM

## 2016-04-14 ENCOUNTER — Inpatient Hospital Stay: Payer: 59 | Attending: Internal Medicine

## 2016-04-14 DIAGNOSIS — Z9119 Patient's noncompliance with other medical treatment and regimen: Secondary | ICD-10-CM | POA: Insufficient documentation

## 2016-04-14 DIAGNOSIS — C7951 Secondary malignant neoplasm of bone: Secondary | ICD-10-CM | POA: Insufficient documentation

## 2016-04-14 DIAGNOSIS — K5900 Constipation, unspecified: Secondary | ICD-10-CM | POA: Diagnosis not present

## 2016-04-14 DIAGNOSIS — Z87442 Personal history of urinary calculi: Secondary | ICD-10-CM | POA: Insufficient documentation

## 2016-04-14 DIAGNOSIS — Z955 Presence of coronary angioplasty implant and graft: Secondary | ICD-10-CM | POA: Insufficient documentation

## 2016-04-14 DIAGNOSIS — I2510 Atherosclerotic heart disease of native coronary artery without angina pectoris: Secondary | ICD-10-CM | POA: Diagnosis not present

## 2016-04-14 DIAGNOSIS — F17210 Nicotine dependence, cigarettes, uncomplicated: Secondary | ICD-10-CM | POA: Insufficient documentation

## 2016-04-14 DIAGNOSIS — Z7902 Long term (current) use of antithrombotics/antiplatelets: Secondary | ICD-10-CM | POA: Insufficient documentation

## 2016-04-14 DIAGNOSIS — Z923 Personal history of irradiation: Secondary | ICD-10-CM | POA: Insufficient documentation

## 2016-04-14 DIAGNOSIS — I10 Essential (primary) hypertension: Secondary | ICD-10-CM | POA: Insufficient documentation

## 2016-04-14 DIAGNOSIS — I252 Old myocardial infarction: Secondary | ICD-10-CM | POA: Insufficient documentation

## 2016-04-14 DIAGNOSIS — C61 Malignant neoplasm of prostate: Secondary | ICD-10-CM | POA: Insufficient documentation

## 2016-04-14 DIAGNOSIS — Z79899 Other long term (current) drug therapy: Secondary | ICD-10-CM | POA: Diagnosis not present

## 2016-04-14 DIAGNOSIS — F329 Major depressive disorder, single episode, unspecified: Secondary | ICD-10-CM | POA: Diagnosis not present

## 2016-04-14 DIAGNOSIS — Z7982 Long term (current) use of aspirin: Secondary | ICD-10-CM | POA: Diagnosis not present

## 2016-04-14 LAB — CBC WITH DIFFERENTIAL/PLATELET
Basophils Absolute: 0.1 10*3/uL (ref 0–0.1)
Basophils Relative: 1 %
EOS ABS: 0.2 10*3/uL (ref 0–0.7)
EOS PCT: 3 %
HCT: 39.4 % — ABNORMAL LOW (ref 40.0–52.0)
HEMOGLOBIN: 13.3 g/dL (ref 13.0–18.0)
LYMPHS ABS: 1.2 10*3/uL (ref 1.0–3.6)
LYMPHS PCT: 14 %
MCH: 31.2 pg (ref 26.0–34.0)
MCHC: 33.8 g/dL (ref 32.0–36.0)
MCV: 92.5 fL (ref 80.0–100.0)
MONOS PCT: 10 %
Monocytes Absolute: 0.8 10*3/uL (ref 0.2–1.0)
NEUTROS PCT: 72 %
Neutro Abs: 6.1 10*3/uL (ref 1.4–6.5)
Platelets: 185 10*3/uL (ref 150–440)
RBC: 4.26 MIL/uL — ABNORMAL LOW (ref 4.40–5.90)
RDW: 14.6 % — ABNORMAL HIGH (ref 11.5–14.5)
WBC: 8.3 10*3/uL (ref 3.8–10.6)

## 2016-04-14 LAB — BASIC METABOLIC PANEL
ANION GAP: 6 (ref 5–15)
BUN: 10 mg/dL (ref 6–20)
CO2: 27 mmol/L (ref 22–32)
Calcium: 10 mg/dL (ref 8.9–10.3)
Chloride: 104 mmol/L (ref 101–111)
Creatinine, Ser: 0.99 mg/dL (ref 0.61–1.24)
GFR calc Af Amer: >60 mL/min (ref >60)
GLUCOSE: 117 mg/dL — AB (ref 65–99)
POTASSIUM: 3.7 mmol/L (ref 3.5–5.1)
Sodium: 137 mmol/L (ref 135–145)

## 2016-04-14 LAB — PSA: PSA: 14.99 ng/mL — AB (ref 0.00–4.00)

## 2016-04-18 ENCOUNTER — Inpatient Hospital Stay (HOSPITAL_BASED_OUTPATIENT_CLINIC_OR_DEPARTMENT_OTHER): Payer: 59 | Admitting: Internal Medicine

## 2016-04-18 ENCOUNTER — Inpatient Hospital Stay: Payer: 59

## 2016-04-18 DIAGNOSIS — Z923 Personal history of irradiation: Secondary | ICD-10-CM | POA: Diagnosis not present

## 2016-04-18 DIAGNOSIS — I10 Essential (primary) hypertension: Secondary | ICD-10-CM | POA: Diagnosis not present

## 2016-04-18 DIAGNOSIS — F17210 Nicotine dependence, cigarettes, uncomplicated: Secondary | ICD-10-CM

## 2016-04-18 DIAGNOSIS — Z7902 Long term (current) use of antithrombotics/antiplatelets: Secondary | ICD-10-CM | POA: Diagnosis not present

## 2016-04-18 DIAGNOSIS — C61 Malignant neoplasm of prostate: Secondary | ICD-10-CM

## 2016-04-18 DIAGNOSIS — Z9119 Patient's noncompliance with other medical treatment and regimen: Secondary | ICD-10-CM | POA: Diagnosis not present

## 2016-04-18 DIAGNOSIS — K5900 Constipation, unspecified: Secondary | ICD-10-CM | POA: Diagnosis not present

## 2016-04-18 DIAGNOSIS — C7951 Secondary malignant neoplasm of bone: Secondary | ICD-10-CM | POA: Diagnosis not present

## 2016-04-18 DIAGNOSIS — I252 Old myocardial infarction: Secondary | ICD-10-CM

## 2016-04-18 MED ORDER — ABIRATERONE ACETATE 250 MG PO TABS: 1000.0000 mg | ORAL_TABLET | Freq: Every day | ORAL | 4 refills | Status: DC

## 2016-04-18 NOTE — Progress Notes (Signed)
Patient here today for follow up.   

## 2016-04-18 NOTE — Progress Notes (Signed)
[facility] OFFICE PROGRESS NOTE  Patient Care Team: Olin Hauser, DO as PCP - General (Family Medicine) Minna Merritts, MD as Consulting Physician (Cardiology)   SUMMARY OF ONCOLOGIC HISTORY: Oncology History   # 2011- PROSTATE CANCER [Gleason 3+4]; s/p Prostatectomy [ also involved bladder neck/ECP; Dr.Polaseck]; July 2014- Biochemical recurrence [PSA 14]- started on Zoladex [Dr.Pandit]; lost to follow up.  # JAN 2017- STAGE IV METASTATIC PROSTATE Cancer to Bone- Feb 13th, 2017-  Lupron q 2m [~end of feb]; PSA: 1021; Declined Chemo; April 2017 [xofigo x6; Dr.Crystal]; AUG 2017- Zytiga + Prednisone BID. Bone scan-Jan 2018- improved skeletal metastases.   # Mets to bone- start X-geva q 6M [May 30th]  # Smoker/ chronic pain/pain clinic      Prostate cancer metastatic to bone ([place])   12/08/2014 Initial Diagnosis    Prostate cancer metastatic to bone [facility])       INTERVAL HISTORY:   66 year old pleasant Caucasian male patient  With above history of prostate cancer metastatic castrate resistant currently on Lupron + Zytiga is here for follow-up.   Patient interim had an MI while traveling to [place]. He had a cardiac catheter in the Hospital in [facility]- had a stent put in.  Patient's pain is stable ; he continues to be on fentanyl 100 and oxycodone 10 milligrams every 8-12 hours. He recently had his prescriptions filled.  Patient states that he has not been taking his Zytiga for the last 3 weeks at the run out. He did not call [place]- sites travel/ a recent MI- recommend he did not call us.    Chronic mild constipation. On stool softeners. No blood in stools. No new chest pain or shortness of breath or cough. He also complains of chronic right chest wall pain for the last 4-5 years. Not any significantly worse.  REVIEW OF SYSTEMS:  A complete 10 point review of system is done which is negative except mentioned above/history of present illness.   PAST  MEDICAL HISTORY :  Past Medical History:  Diagnosis Date  . ASCVD (arteriosclerotic cardiovascular disease)   . Back pain   . Back pain 10/09/2012  . Bone cancer ([place])   . Cancer associated pain   . Depression   . History of kidney stones   . Hypertension   . Joint pain   . Prostate cancer ([place])   . Right arm pain 01/10/2016  . Right foot pain 01/10/2016  . Right leg pain 01/10/2016  . SOB (shortness of breath) 10/09/2012  . Unable to ambulate 10/09/2012    PAST SURGICAL HISTORY :   Past Surgical History:  Procedure Laterality Date  . CARDIAC CATHETERIZATION     [facility]  . CARDIAC CATHETERIZATION N/A 08/16/2015   Procedure: Left Heart Cath and Coronary Angiography;  Surgeon: Yolonda Kida, MD;  Location: [place] [facility];  Service: Cardiovascular;  Laterality: N/A;  . CARDIAC CATHETERIZATION N/A 08/16/2015   Procedure: Coronary Stent Intervention;  Surgeon: Yolonda Kida, MD;  Location: [place] [facility];  Service: Cardiovascular;  Laterality: N/A;  . CERVIX SURGERY    . PROSTATECTOMY    . SPINE SURGERY    . TONSILLECTOMY    . WRIST SURGERY      FAMILY HISTORY :   Family History  Problem Relation Age of Onset  . Heart attack Mother   . Hypertension Mother   . Heart attack Father     SOCIAL HISTORY:   Social History  Substance Use  Topics  . Smoking status: Former Smoker    Packs/day: 1.00    Years: 40.00    Types: Cigarettes  . Smokeless tobacco: Never Used     Comment: "I use a pack a day in 24 hours. I have quit and started in past."  . Alcohol use No    ALLERGIES:  has No Known Allergies.  MEDICATIONS:  Current Outpatient Prescriptions  Medication Sig Dispense Refill  . abiraterone Acetate (ZYTIGA) 250 MG tablet Take 4 tablets (1,000 mg total) by mouth daily. Take on an empty stomach 1 hour before or 2 hours after a meal 120 tablet 4  . acetaminophen (TYLENOL) 500 MG tablet Take 1,500 mg by mouth every 8 (eight) hours as needed.     Marland Kitchen aspirin 81  MG tablet Take 81 mg by mouth daily.    Marland Kitchen atorvastatin (LIPITOR) 80 MG tablet Take by mouth.    . clopidogrel (PLAVIX) 75 MG tablet Take by mouth.    . fentaNYL (DURAGESIC - DOSED MCG/HR) 100 MCG/HR Place 1 patch (100 mcg total) onto the skin every 3 (three) days. 10 patch 0  . nitroGLYCERIN (NITROSTAT) 0.4 MG SL tablet Place under the tongue.    Marland Kitchen Oxycodone HCl 10 MG TABS One pill every 8 hours as needed for pain 90 tablet 0  . predniSONE (DELTASONE) 5 MG tablet Take 5 mg by mouth 2 (two) times daily with a meal.    . Wheat Dextrin (BENEFIBER) POWD Stir 2 tsp. TID into 4-8 oz of any non-carbonated beverage or soft food (hot or cold) 500 g PRN   No current facility-administered medications for this visit.     PHYSICAL EXAMINATION: ECOG PERFORMANCE STATUS: 1 - Symptomatic but completely ambulatory  BP 126/86 (BP Location: Left Arm, Patient Position: Sitting)   Pulse 96   Temp 97.5 F (36.4 C) (Tympanic)   Wt 190 lb 12.9 oz (86.5 kg)   BMI 26.61 kg/m   Filed Weights   04/18/16 1333  Weight: 190 lb 12.9 oz (86.5 kg)   GENERAL: Well-nourished well-developed; Alert, no distress and comfortable. He is alone. EYES: no pallor or icterus OROPHARYNX: no thrush or ulceration; Upper dentures. NECK: supple, no masses felt LYMPH: no palpable lymphadenopathy in the cervical, axillary or inguinal regions LUNGS: clear to auscultation and No wheeze or crackles HEART/CVS: regular rate & rhythm and no murmurs; No lower extremity edema ABDOMEN:abdomen soft, non-tender and normal bowel sounds; No hepatomegaly. Musculoskeletal:no cyanosis of digits and no clubbing; No significant tenderness noted. PSYCH: alert & oriented x 3 with fluent speech NEURO: no focal motor/sensory deficits SKIN: no rashes or significant lesions   LABORATORY DATA:  I have reviewed the data as listed    Component Value Date/Time   NA 137 04/14/2016 1040   NA 140 07/13/2013 1542   K 3.7 04/14/2016 1040   K 4.3  07/13/2013 1542   CL 104 04/14/2016 1040   CL 107 07/13/2013 1542   CO2 27 04/14/2016 1040   CO2 26 07/13/2013 1542   GLUCOSE 117 (H) 04/14/2016 1040   GLUCOSE 106 (H) 07/13/2013 1542   BUN 10 04/14/2016 1040   BUN 11 07/13/2013 1542   CREATININE 0.99 04/14/2016 1040   CREATININE 1.01 07/13/2013 1542   CALCIUM 10.0 04/14/2016 1040   CALCIUM 9.9 07/13/2013 1542   PROT 6.6 02/23/2016 1211   PROT 8.1 07/13/2013 1542   ALBUMIN 4.1 02/23/2016 1211   ALBUMIN 4.3 07/13/2013 1542   AST 15 02/23/2016 1211  AST 26 07/13/2013 1542   ALT 13 (L) 02/23/2016 1211   ALT 32 07/13/2013 1542   ALKPHOS 33 (L) 02/23/2016 1211   ALKPHOS 74 07/13/2013 1542   BILITOT 0.4 02/23/2016 1211   BILITOT 0.4 07/13/2013 1542   GFRNONAA >60 04/14/2016 1040   GFRNONAA >60 07/13/2013 1542   GFRAA >60 04/14/2016 1040   GFRAA >60 07/13/2013 1542    No results found for: SPEP, UPEP  Lab Results  Component Value Date   WBC 8.3 04/14/2016   NEUTROABS 6.1 04/14/2016   HGB 13.3 04/14/2016   HCT 39.4 (L) 04/14/2016   MCV 92.5 04/14/2016   PLT 185 04/14/2016      Chemistry      Component Value Date/Time   NA 137 04/14/2016 1040   NA 140 07/13/2013 1542   K 3.7 04/14/2016 1040   K 4.3 07/13/2013 1542   CL 104 04/14/2016 1040   CL 107 07/13/2013 1542   CO2 27 04/14/2016 1040   CO2 26 07/13/2013 1542   BUN 10 04/14/2016 1040   BUN 11 07/13/2013 1542   CREATININE 0.99 04/14/2016 1040   CREATININE 1.01 07/13/2013 1542      Component Value Date/Time   CALCIUM 10.0 04/14/2016 1040   CALCIUM 9.9 07/13/2013 1542   ALKPHOS 33 (L) 02/23/2016 1211   ALKPHOS 74 07/13/2013 1542   AST 15 02/23/2016 1211   AST 26 07/13/2013 1542   ALT 13 (L) 02/23/2016 1211   ALT 32 07/13/2013 1542   BILITOT 0.4 02/23/2016 1211   BILITOT 0.4 07/13/2013 1542     Results for Short, Robert L (MRN [id number]) as of 02/08/2016 11:43  Ref. Range 08/17/2004 08:38 07/10/2005 16:47 07/19/2005 11:33 07/19/2005 16:45 03/15/2006 11:24  03/16/2006 11:17 03/22/2006 11:14 04/06/2008 15:16 02/09/2010 19:08 02/09/2010 20:17 02/09/2010 20:21 07/15/2012 13:47 08/15/2012 10:21 08/15/2012 11:12 08/15/2012 11:13 08/16/2012 10:15 08/16/2012 11:09 08/21/2012 11:00 08/21/2012 11:11 08/21/2012 12:12 10/09/2012 15:38 10/16/2012 10:10 10/16/2012 11:26 12/05/2012 10:07 04/19/2013 19:44 04/19/2013 19:47 04/19/2013 20:12 05/23/2013 13:41 07/11/2013 13:17 07/13/2013 15:42 07/13/2013 16:03 07/13/2013 20:59 07/24/2013 10:01 11/03/2013 16:19 09/29/2014 13:52 12/07/2014 00:00 01/29/2015 00:00 02/01/2015 14:50 02/04/2015 10:50 02/04/2015 13:25 02/08/2015 09:52 02/11/2015 10:47 03/16/2015 10:32 04/06/2015 16:17 04/20/2015 13:28 04/27/2015 11:49 05/19/2015 13:35 05/24/2015 15:02 05/26/2015 11:18 06/01/2015 11:35 06/17/2015 12:52 06/22/2015 15:14 07/14/2015 13:25 08/10/2015 14:03 08/16/2015 16:19 08/16/2015 16:19 08/16/2015 16:19 08/16/2015 16:22 08/16/2015 16:26 08/16/2015 16:30 08/16/2015 17:06 08/16/2015 18:33 08/16/2015 21:13 08/17/2015 04:16 08/17/2015 05:16 08/17/2015 15:43 08/17/2015 20:52 08/18/2015 17:10 09/15/2015 14:25 10/05/2015 13:44 11/09/2015 00:00 11/18/2015 13:35 12/18/2015 03:47 12/18/2015 04:05 01/04/2016 13:25 01/10/2016 14:42 01/11/2016 14:33 02/03/2016 13:51  PSA Latest Ref Range: 0.00 - 4.00 ng/mL            14.2 (H) 14.6 (H)           0.2                 1,021.00 (H)     33.39 (H)    43.62 (H)   14.08 (H) 15.26 (H)                1.40  3.03   6.79 (H)   10.47 (H)    ASSESSMENT & PLAN:    Prostate cancer metastatic to bone ([facility]) Castrate resistant prostate cancer-  Patient currently on Lupron androgen deprivation therapy; on Zytiga- since Aug 2017.  Jan 2018- bone scan- no significant active disease. April 2018- 14.9 [up]; ? Non-compliance with zytiga.   #  Recommend restarting Zytiga 4 pills/prednisone BID. Continue  current dose. New prescription given.  # X-geva/ Lupron every 3 months- on ca+vit D. No AEs noted.   # Back pain secondary malignancy status post radiation; improved; continue current regimen- fentanyl 100  g every 72 hours; continue oxycodone 10 mg every 8-12 hours. New scripts given.   # recent MI [April 2018; [place]]- s/p stenting; on Plavix.   # follow up on May 29th / labs- few days prior/ shots again [X-geva/lupron].      [name], MD 04/18/2016 2:16 PM

## 2016-04-18 NOTE — Assessment & Plan Note (Addendum)
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  Jan 2018- bone scan- no significant active disease. April 2018- 14.9 [up]; ? Non-compliance with zytiga.   #  Recommend restarting Zytiga 4 pills/prednisone BID. Continue current dose. New prescription given.  # X-geva/ Lupron every 3 months- on ca+vit D. No AEs noted.   # Back pain secondary malignancy status post radiation; improved; continue current regimen- fentanyl 100 g every 72 hours; continue oxycodone 10 mg every 8-12 hours. New scripts given.   # recent MI [April 2018; [place]]- s/p stenting; on Plavix.   # follow up on May 29th / labs- few days prior/ shots again [X-geva/lupron].
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
## 2016-04-21 ENCOUNTER — Ambulatory Visit (INDEPENDENT_AMBULATORY_CARE_PROVIDER_SITE_OTHER): Payer: 59 | Admitting: Cardiovascular Disease

## 2016-04-21 ENCOUNTER — Encounter: Payer: Self-pay | Admitting: Cardiovascular Disease

## 2016-04-21 VITALS — BP 138/90 | HR 80 | Ht 71.0 in | Wt 189.8 lb

## 2016-04-21 DIAGNOSIS — I10 Essential (primary) hypertension: Secondary | ICD-10-CM

## 2016-04-21 DIAGNOSIS — I2102 ST elevation (STEMI) myocardial infarction involving left anterior descending coronary artery: Secondary | ICD-10-CM

## 2016-04-21 DIAGNOSIS — I2089 Other forms of angina pectoris: Secondary | ICD-10-CM | POA: Insufficient documentation

## 2016-04-21 DIAGNOSIS — C7951 Secondary malignant neoplasm of bone: Secondary | ICD-10-CM | POA: Diagnosis not present

## 2016-04-21 DIAGNOSIS — I25118 Atherosclerotic heart disease of native coronary artery with other forms of angina pectoris: Secondary | ICD-10-CM

## 2016-04-21 DIAGNOSIS — C61 Malignant neoplasm of prostate: Secondary | ICD-10-CM

## 2016-04-21 DIAGNOSIS — I200 Unstable angina: Secondary | ICD-10-CM

## 2016-04-21 DIAGNOSIS — E7800 Pure hypercholesterolemia, unspecified: Secondary | ICD-10-CM | POA: Diagnosis not present

## 2016-04-21 DIAGNOSIS — I2109 ST elevation (STEMI) myocardial infarction involving other coronary artery of anterior wall: Secondary | ICD-10-CM

## 2016-04-21 DIAGNOSIS — F17200 Nicotine dependence, unspecified, uncomplicated: Secondary | ICD-10-CM

## 2016-04-21 DIAGNOSIS — I208 Other forms of angina pectoris: Secondary | ICD-10-CM | POA: Insufficient documentation

## 2016-04-21 MED FILL — ZYTIGA 250 MG TABLET: 250 | 30 days supply | Qty: 120 | Fill #0

## 2016-04-21 NOTE — Patient Instructions (Addendum)
Please monitor you heart rate and blood pressure  Medication Instructions:   No medication changes made  Labwork:  No new labs needed  Testing/Procedures:  We are referring you to cardiac rehab They will review your chart and call you with an appt  I recommend watching educational videos on topics of interest to you at:       [email or web address]  Enter code: [email or web address]    Follow-Up: It was a pleasure seeing you in the office today. Please call us if you have new issues that need to be addressed before your next appt.  [phone]  Your physician wants you to follow-up in: 6 months.  You will receive a reminder letter in the mail two months in advance. If you don't receive a letter, please call our office to schedule the follow-up appointment.  If you need a refill on your cardiac medications before your next appointment, please call your pharmacy.     Cardiac Rehabilitation What is cardiac rehabilitation? Cardiac rehabilitation is a treatment program that helps improve the health and well-being of people who have heart problems. Cardiac rehabilitation includes exercise training, education, and counseling to help you get stronger and return to an active lifestyle. This program can help you get better faster and reduce any future hospital stays. Why might I need cardiac rehabilitation?   Cardiac rehabilitation programs can help when you have or have had:  A heart attack.  Heart failure.  Peripheral artery disease.  Coronary artery disease.  Angina.  Lung or breathing problems. Cardiac rehabilitation programs are also used when you have had:  Coronary artery bypass graft surgery.  Heart valve replacement.  Heart stent placement.  Heart transplant.  Aneurysm repair. What are the benefits of cardiac rehabilitation? Cardiac rehabilitation can help:  Reduce problems like chest pain and trouble breathing.  Change risk factors that contribute to heart disease,  such as:  Smoking.  High blood pressure.  High cholesterol.  Diabetes.  Being out of shape or not active.  Weighing more than 30% higher than your ideal weight.  Diet.  Improve your mental outlook so you feel:  More hopeful.  Better about yourself.  More confident about taking care of yourself.  Get support from health experts as well as other people with similar problems.  Learn how to manage and understand your medicines.  Teach your family about your condition and how to participate in your recovery. What happens in cardiac rehabilitation? You will be assessed by a cardiac rehabilitation team. They will check your health history and do a physical exam. You may need blood tests, stress tests, and other evaluations to make sure that you are ready to start cardiac rehabilitation. The cardiac rehabilitation team works with you to make a plan based on your health and goals. Your program will be tailored to fit you and your needs and may change as you progress. You may work with a health care team that includes:  Doctors.  Nurses.  Dietitians.  Psychologists.  Exercise specialists.  Physical and occupational therapists. What are the phases of cardiac rehabilitation? A cardiac rehabilitation program is often divided into phases. You advance from one phase to the next. Phase One  This phase starts while you are still in the hospital. You may start by walking in your room and then in the hall. You may start some simple exercises with a therapist. Phase Two  This phase begins when you go home or to another facility. This phase may last 8-12 weeks.  You will travel to a cardiac rehabilitation center or another place where rehabilitation is offered. You will slowly increase your activity level while being closely watched by a nurse or therapist. Exercises may include a combination of strength or resistance training and "cardio" or aerobic movement on a treadmill or other  machines. Your condition will determine how often and how long these sessions last. In phase two, you may learn how to cook healthy meals, control your blood sugar, and manage your medicines. You may need help with scheduling or planning how and when to take your medicines. If you have questions about your medicines, it is very important that you talk to your health care provider. Phase Three  This phase continues for the rest of your life. There will be less supervision. You may still participate in cardiac rehabilitation activities or become part of a group in your community. You may benefit from talking about your experience with other people who are facing similar challenges. Get help right away if:  You have severe chest discomfort, especially if the pain is crushing or pressure-like and spreads to your arms, back, neck, or jaw. Do not wait to see if the pain will go away.  You have weakness or numbness in your face, arms, or legs, especially on one side of the body.  Your speech is slurred.  You are confused.  You have a sudden severe headache or loss of vision.  You have shortness of breath.  You are sweating and have nausea.  You feel dizzy or faint.  You are fatigued. These symptoms may represent a serious problem that is an emergency. Do not wait to see if the symptoms will go away. Get medical help right away. Call your local emergency services (911 in the [place] Do not drive yourself to the hospital. This information is not intended to replace advice given to you by your health care provider. Make sure you discuss any questions you have with your health care provider. Document Released: 09/28/2007 Document Revised: 12/06/2015 Document Reviewed: 11/02/2014 Elsevier Interactive Patient Education  2017 [employer].

## 2016-04-27 ENCOUNTER — Other Ambulatory Visit: Payer: Self-pay

## 2016-04-27 ENCOUNTER — Ambulatory Visit
Admission: RE | Admit: 2016-04-27 | Discharge: 2016-04-27 | Disposition: A | Discharge status: Home / Self Care | Payer: 59 | Source: Ambulatory Visit | Attending: Radiation Oncology | Admitting: Radiation Oncology

## 2016-04-27 ENCOUNTER — Telehealth: Payer: Self-pay | Admitting: Cardiovascular Disease

## 2016-04-27 ENCOUNTER — Encounter: Payer: Self-pay | Admitting: Radiation Oncology

## 2016-04-27 VITALS — BP 157/95 | HR 77 | Temp 97.3°F | Resp 20 | Wt 191.1 lb

## 2016-04-27 DIAGNOSIS — C61 Malignant neoplasm of prostate: Secondary | ICD-10-CM | POA: Insufficient documentation

## 2016-04-27 DIAGNOSIS — M549 Dorsalgia, unspecified: Secondary | ICD-10-CM | POA: Diagnosis not present

## 2016-04-27 DIAGNOSIS — C7951 Secondary malignant neoplasm of bone: Secondary | ICD-10-CM | POA: Insufficient documentation

## 2016-04-27 DIAGNOSIS — R109 Unspecified abdominal pain: Secondary | ICD-10-CM | POA: Diagnosis not present

## 2016-04-27 DIAGNOSIS — Z923 Personal history of irradiation: Secondary | ICD-10-CM | POA: Insufficient documentation

## 2016-04-27 MED ORDER — ATORVASTATIN CALCIUM 80 MG PO TABS: 80.0000 mg | ORAL_TABLET | Freq: Every day | ORAL | 3 refills | Status: DC

## 2016-04-27 MED ORDER — CLOPIDOGREL BISULFATE 75 MG PO TABS: 75.0000 mg | ORAL_TABLET | Freq: Every day | ORAL | 3 refills | Status: DC

## 2016-04-27 NOTE — Telephone Encounter (Signed)
Refills sent to [facility].

## 2016-04-27 NOTE — Telephone Encounter (Signed)
Please advise if ok to refill medications not originally prescribed by TG, MD.  Atorvastatin 80 mg tablet.  Plavix 75 mg tablet.

## 2016-04-27 NOTE — Progress Notes (Signed)
Radiation Oncology Follow up Note  Name: Robert Short   Date:   04/27/2016 MRN:  [id number] DOB: [birth year]    This 66 y.o. male presents to the clinic today for six-month follow-up status postXofigo course of treatment .  REFERRING PROVIDER: Arlis Porta., MD  HPI: Patient is a 66 year old male now out 6 months having completedXofigo treatment for stage IV prostate cancer. His exam external beam to spine and bilateral hips. He's currently on Lupron androgen deprivation therapy and Zytiga with prednisone. He is doing fairly well. Has some pain in his right flank although that's been present for years. Also has cervical spine pain although has had multiple surgical corrections for spinal disease. He specifically denies any new areas of bone pain. He is continuing his treatment was with medical oncology well.  COMPLICATIONS OF TREATMENT: none  FOLLOW UP COMPLIANCE: keeps appointments   PHYSICAL EXAM:  BP (!) 157/95   Pulse 77   Temp 97.3 F (36.3 C)   Resp 20   Wt 191 lb 2.2 oz (86.7 kg)   BMI 26.66 kg/m  Well-developed well-nourished patient in NAD. HEENT reveals PERLA, EOMI, discs not visualized.  Oral cavity is clear. No oral mucosal lesions are identified. Neck is clear without evidence of cervical or supraclavicular adenopathy. Lungs are clear to A&P. Cardiac examination is essentially unremarkable with regular rate and rhythm without murmur rub or thrill. Abdomen is benign with no organomegaly or masses noted. Motor sensory and DTR levels are equal and symmetric in the upper and lower extremities. Cranial nerves II through XII are grossly intact. Proprioception is intact. No peripheral adenopathy or edema is identified. No motor or sensory levels are noted. Crude visual fields are within normal range.  RADIOLOGY RESULTS:  No current films for review  PLAN:  Present time he is doing well fairly asymptomatic continues on treatment under medical oncology's direction. I've asked  to see him back in 6 months for follow-up. He is to contact me should he develop any new areas of painful bony disease.  I would like to take this opportunity to thank you for allowing me to participate in the care of your patient.Armstead Peaks., MD

## 2016-04-27 NOTE — Telephone Encounter (Signed)
°*  STAT* If patient is at the pharmacy, call can be transferred to refill team.   1. Which medications need to be refilled? (please list name of each medication and dose if known) Plavix   75 mg po daily                 Lipitor 80 mg po daily    2. Which pharmacy/location (including street and city if local pharmacy) is medication to be sent to?  [facility]    3. Do they need a 30 day or 90 day supply?   [place]

## 2016-05-01 ENCOUNTER — Other Ambulatory Visit: Payer: Self-pay | Admitting: Internal Medicine

## 2016-05-01 DIAGNOSIS — C7951 Secondary malignant neoplasm of bone: Secondary | ICD-10-CM

## 2016-05-01 DIAGNOSIS — C61 Malignant neoplasm of prostate: Secondary | ICD-10-CM

## 2016-05-01 DIAGNOSIS — G893 Neoplasm related pain (acute) (chronic): Secondary | ICD-10-CM

## 2016-05-01 MED ORDER — FENTANYL 100 MCG/HR TD PT72: 100.0000 ug | MEDICATED_PATCH | TRANSDERMAL | 0 refills | Status: DC

## 2016-05-01 MED ORDER — OXYCODONE HCL 10 MG PO TABS: ORAL_TABLET | ORAL | 0 refills | Status: DC

## 2016-05-04 ENCOUNTER — Encounter: Payer: 59 | Attending: Cardiovascular Disease | Admitting: *Deleted

## 2016-05-04 VITALS — Ht 70.0 in | Wt 186.2 lb

## 2016-05-04 DIAGNOSIS — I2102 ST elevation (STEMI) myocardial infarction involving left anterior descending coronary artery: Secondary | ICD-10-CM | POA: Diagnosis not present

## 2016-05-04 DIAGNOSIS — I10 Essential (primary) hypertension: Secondary | ICD-10-CM | POA: Insufficient documentation

## 2016-05-04 DIAGNOSIS — I25118 Atherosclerotic heart disease of native coronary artery with other forms of angina pectoris: Secondary | ICD-10-CM | POA: Diagnosis not present

## 2016-05-04 DIAGNOSIS — C7951 Secondary malignant neoplasm of bone: Secondary | ICD-10-CM | POA: Insufficient documentation

## 2016-05-04 DIAGNOSIS — C61 Malignant neoplasm of prostate: Secondary | ICD-10-CM | POA: Insufficient documentation

## 2016-05-04 DIAGNOSIS — Z955 Presence of coronary angioplasty implant and graft: Secondary | ICD-10-CM | POA: Insufficient documentation

## 2016-05-04 DIAGNOSIS — F17200 Nicotine dependence, unspecified, uncomplicated: Secondary | ICD-10-CM | POA: Diagnosis not present

## 2016-05-04 DIAGNOSIS — E7800 Pure hypercholesterolemia, unspecified: Secondary | ICD-10-CM | POA: Insufficient documentation

## 2016-05-04 DIAGNOSIS — I213 ST elevation (STEMI) myocardial infarction of unspecified site: Secondary | ICD-10-CM
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
## 2016-05-04 NOTE — Progress Notes (Signed)
Daily Session Note  Patient Details  Name: Robert Short MRN: [id number] Date of Birth: Nov 01, 1950 Referring Provider:    Encounter Date: 05/04/2016  Check In:     Session Check In - 05/04/16 1350      Check-In   Location [facility]-Cardiac & Pulmonary Rehab   Staff Present Rogers Seeds, BA, ACSM CEP, Exercise Physiologist  Darel Hong, RN BSN; Renita Papa RN BSN   Supervising physician immediately available to respond to emergencies See telemetry face sheet for immediately available ER MD   Medication changes reported     No   Fall or balance concerns reported    No   Tobacco Cessation No Change   Warm-up and Cool-down Performed as group-led instruction   Resistance Training Performed Yes   VAD Patient? No     Pain Assessment   Currently in Pain? No/denies   Multiple Pain Sites No         History  Smoking Status  . Former Smoker  . Packs/day: 0.50  . Years: 40.00  . Types: Cigarettes  Smokeless Tobacco  . Never Used    Comment: Smokes 1/2 a pack per day. Would like to quit again at some point, but he enjoys it even though he knows it is bad for him.     Goals Met:  Personal goals reviewed Strength training completed today  Goals Unmet:  Not Applicable  Comments: med review   Dr. Emily Filbert is Medical Director for [facility].

## 2016-05-08 ENCOUNTER — Ambulatory Visit: Payer: 59 | Admitting: Cardiovascular Disease

## 2016-05-08 ENCOUNTER — Encounter: Payer: 59 | Admitting: *Deleted

## 2016-05-08 DIAGNOSIS — F17200 Nicotine dependence, unspecified, uncomplicated: Secondary | ICD-10-CM | POA: Diagnosis not present

## 2016-05-08 DIAGNOSIS — I10 Essential (primary) hypertension: Secondary | ICD-10-CM | POA: Diagnosis not present

## 2016-05-08 DIAGNOSIS — E7800 Pure hypercholesterolemia, unspecified: Secondary | ICD-10-CM | POA: Diagnosis not present

## 2016-05-08 DIAGNOSIS — I25118 Atherosclerotic heart disease of native coronary artery with other forms of angina pectoris: Secondary | ICD-10-CM | POA: Diagnosis not present

## 2016-05-08 DIAGNOSIS — I2102 ST elevation (STEMI) myocardial infarction involving left anterior descending coronary artery: Secondary | ICD-10-CM | POA: Diagnosis not present

## 2016-05-08 DIAGNOSIS — Z955 Presence of coronary angioplasty implant and graft: Secondary | ICD-10-CM

## 2016-05-08 DIAGNOSIS — C61 Malignant neoplasm of prostate: Secondary | ICD-10-CM | POA: Diagnosis not present

## 2016-05-08 DIAGNOSIS — C7951 Secondary malignant neoplasm of bone: Secondary | ICD-10-CM | POA: Diagnosis not present

## 2016-05-08 DIAGNOSIS — I213 ST elevation (STEMI) myocardial infarction of unspecified site: Secondary | ICD-10-CM

## 2016-05-08 NOTE — Progress Notes (Signed)
Daily Session Note  Patient Details  Name: Robert Short MRN: [id number] Date of Birth: 1950/10/15 Referring Provider:    Encounter Date: 05/08/2016  Check In:     Session Check In - 05/08/16 1631      Check-In   Location [facility]   Staff Present Earlean Shawl, BS, ACSM CEP, Exercise Physiologist;Susanne Bice, RN, BSN, CCRP;Mary Kellie Shropshire, RN, BSN, MA   Supervising physician immediately available to respond to emergencies See telemetry face sheet for immediately available ER MD   Medication changes reported     No   Fall or balance concerns reported    No   Tobacco Cessation No Change   Warm-up and Cool-down Performed on first and last piece of equipment   Resistance Training Performed Yes   VAD Patient? No     Pain Assessment   Currently in Pain? No/denies   Multiple Pain Sites No         History  Smoking Status  . Former Smoker  . Packs/day: 0.50  . Years: 40.00  . Types: Cigarettes  Smokeless Tobacco  . Never Used    Comment: Smokes 1/2 a pack per day. Would like to quit again at some point, but he enjoys it even though he knows it is bad for him.     Goals Met:  Exercise tolerated well Personal goals reviewed No report of cardiac concerns or symptoms Strength training completed today  Goals Unmet:  Not Applicable  Comments: First full day of exercise!  Patient was oriented to gym and equipment including functions, settings, policies, and procedures.  Patient's individual exercise prescription and treatment plan were reviewed.  All starting workloads were established based on the results of the 6 minute walk test done at initial orientation visit.  The plan for exercise progression was also introduced and progression will be customized based on patient's performance and goals.    Dr. Emily Filbert is Medical Director for [place] and [facility].

## 2016-05-10 ENCOUNTER — Encounter: Payer: 59 | Admitting: *Deleted

## 2016-05-10 DIAGNOSIS — I25118 Atherosclerotic heart disease of native coronary artery with other forms of angina pectoris: Secondary | ICD-10-CM | POA: Diagnosis not present

## 2016-05-10 DIAGNOSIS — C7951 Secondary malignant neoplasm of bone: Secondary | ICD-10-CM | POA: Diagnosis not present

## 2016-05-10 DIAGNOSIS — I2102 ST elevation (STEMI) myocardial infarction involving left anterior descending coronary artery: Secondary | ICD-10-CM | POA: Diagnosis not present

## 2016-05-10 DIAGNOSIS — E7800 Pure hypercholesterolemia, unspecified: Secondary | ICD-10-CM | POA: Diagnosis not present

## 2016-05-10 DIAGNOSIS — I213 ST elevation (STEMI) myocardial infarction of unspecified site: Secondary | ICD-10-CM

## 2016-05-10 DIAGNOSIS — Z955 Presence of coronary angioplasty implant and graft: Secondary | ICD-10-CM | POA: Diagnosis not present

## 2016-05-10 DIAGNOSIS — I10 Essential (primary) hypertension: Secondary | ICD-10-CM | POA: Diagnosis not present

## 2016-05-10 DIAGNOSIS — F17200 Nicotine dependence, unspecified, uncomplicated: Secondary | ICD-10-CM | POA: Diagnosis not present

## 2016-05-10 DIAGNOSIS — C61 Malignant neoplasm of prostate: Secondary | ICD-10-CM | POA: Diagnosis not present

## 2016-05-10 NOTE — Progress Notes (Signed)
Daily Session Note  Patient Details  Name: Robert Short MRN: [id number] Date of Birth: February 17, 1950 Referring Provider:    Encounter Date: 05/10/2016  Check In:     Session Check In - 05/10/16 1655      Check-In   Location [facility]   Staff Present Gerlene Burdock, RN, Vickki Hearing, BA, ACSM CEP, Exercise Physiologist;Mary Kellie Shropshire, RN, BSN, MA   Supervising physician immediately available to respond to emergencies See telemetry face sheet for immediately available ER MD   Medication changes reported     No   Fall or balance concerns reported    No   Tobacco Cessation No Change   Warm-up and Cool-down Performed on first and last piece of equipment   Resistance Training Performed Yes   VAD Patient? No     Pain Assessment   Currently in Pain? No/denies         History  Smoking Status  . Former Smoker  . Packs/day: 0.50  . Years: 40.00  . Types: Cigarettes  Smokeless Tobacco  . Never Used    Comment: Smokes 1/2 a pack per day. Would like to quit again at some point, but he enjoys it even though he knows it is bad for him.     Goals Met:  Proper associated with RPD/PD & O2 Sat Exercise tolerated well  Goals Unmet:  Not Applicable  Comments:     Dr. Emily Filbert is Medical Director for [place] and [facility].

## 2016-05-11 DIAGNOSIS — C61 Malignant neoplasm of prostate: Secondary | ICD-10-CM | POA: Diagnosis not present

## 2016-05-11 DIAGNOSIS — Z955 Presence of coronary angioplasty implant and graft: Secondary | ICD-10-CM | POA: Diagnosis not present

## 2016-05-11 DIAGNOSIS — F17200 Nicotine dependence, unspecified, uncomplicated: Secondary | ICD-10-CM | POA: Diagnosis not present

## 2016-05-11 DIAGNOSIS — C7951 Secondary malignant neoplasm of bone: Secondary | ICD-10-CM | POA: Diagnosis not present

## 2016-05-11 DIAGNOSIS — I2102 ST elevation (STEMI) myocardial infarction involving left anterior descending coronary artery: Secondary | ICD-10-CM | POA: Diagnosis not present

## 2016-05-11 DIAGNOSIS — I213 ST elevation (STEMI) myocardial infarction of unspecified site: Secondary | ICD-10-CM

## 2016-05-11 DIAGNOSIS — I10 Essential (primary) hypertension: Secondary | ICD-10-CM | POA: Diagnosis not present

## 2016-05-11 DIAGNOSIS — E7800 Pure hypercholesterolemia, unspecified: Secondary | ICD-10-CM | POA: Diagnosis not present

## 2016-05-11 DIAGNOSIS — I25118 Atherosclerotic heart disease of native coronary artery with other forms of angina pectoris: Secondary | ICD-10-CM | POA: Diagnosis not present

## 2016-05-11 NOTE — Progress Notes (Signed)
Daily Session Note  Patient Details  Name: Robert Short MRN: [id number] Date of Birth: 04/30/1950 Referring Provider:    Encounter Date: 05/11/2016  Check In:     Session Check In - 05/11/16 1643      Check-In   Location [facility]   Staff Present Gerlene Burdock, RN, Vickki Hearing, BA, ACSM CEP, Exercise Physiologist;Kelly Amedeo Plenty, BS, ACSM CEP, Exercise Physiologist   Supervising physician immediately available to respond to emergencies See telemetry face sheet for immediately available ER MD   Medication changes reported     No   Fall or balance concerns reported    No   Warm-up and Cool-down Performed on first and last piece of equipment   Resistance Training Performed Yes   VAD Patient? No     Pain Assessment   Currently in Pain? No/denies         History  Smoking Status  . Former Smoker  . Packs/day: 0.50  . Years: 40.00  . Types: Cigarettes  Smokeless Tobacco  . Never Used    Comment: Smokes 1/2 a pack per day. Would like to quit again at some point, but he enjoys it even though he knows it is bad for him.     Goals Met:  Independence with exercise equipment Exercise tolerated well No report of cardiac concerns or symptoms Strength training completed today  Goals Unmet:  Not Applicable  Comments: Pt able to follow exercise prescription today without complaint.  Will continue to monitor for progression.    Dr. Emily Filbert is Medical Director for [place] and [facility].

## 2016-05-17 ENCOUNTER — Encounter: Payer: 59 | Admitting: *Deleted

## 2016-05-17 DIAGNOSIS — Z955 Presence of coronary angioplasty implant and graft: Secondary | ICD-10-CM | POA: Diagnosis not present

## 2016-05-17 DIAGNOSIS — I10 Essential (primary) hypertension: Secondary | ICD-10-CM | POA: Diagnosis not present

## 2016-05-17 DIAGNOSIS — E7800 Pure hypercholesterolemia, unspecified: Secondary | ICD-10-CM | POA: Diagnosis not present

## 2016-05-17 DIAGNOSIS — I213 ST elevation (STEMI) myocardial infarction of unspecified site: Secondary | ICD-10-CM

## 2016-05-17 DIAGNOSIS — I2102 ST elevation (STEMI) myocardial infarction involving left anterior descending coronary artery: Secondary | ICD-10-CM | POA: Diagnosis not present

## 2016-05-17 DIAGNOSIS — F17200 Nicotine dependence, unspecified, uncomplicated: Secondary | ICD-10-CM | POA: Diagnosis not present

## 2016-05-17 DIAGNOSIS — I25118 Atherosclerotic heart disease of native coronary artery with other forms of angina pectoris: Secondary | ICD-10-CM | POA: Diagnosis not present

## 2016-05-17 DIAGNOSIS — C7951 Secondary malignant neoplasm of bone: Secondary | ICD-10-CM | POA: Diagnosis not present

## 2016-05-17 DIAGNOSIS — C61 Malignant neoplasm of prostate: Secondary | ICD-10-CM | POA: Diagnosis not present

## 2016-05-17 NOTE — Progress Notes (Signed)
Daily Session Note  Patient Details  Name: Robert Short MRN: [id number] Date of Birth: [birth year] Referring Provider:    Encounter Date: 05/17/2016  Check In:     Session Check In - 05/17/16 1626      Check-In   Location [facility]   Staff Present Nyoka Cowden, RN, BSN, MA;Dalyn Kjos, RN, Vickki Hearing, BA, ACSM CEP, Exercise Physiologist   Supervising physician immediately available to respond to emergencies See telemetry face sheet for immediately available ER MD   Medication changes reported     No   Fall or balance concerns reported    No   Tobacco Cessation No Change   Warm-up and Cool-down Performed on first and last piece of equipment   Resistance Training Performed Yes   VAD Patient? No     Pain Assessment   Currently in Pain? No/denies           Exercise Prescription Changes - 05/17/16 1300      Response to Exercise   Blood Pressure (Admit) 142/70   Blood Pressure (Exercise) 162/88   Blood Pressure (Exit) 138/80   Heart Rate (Admit) 67 bpm   Heart Rate (Exercise) 108 bpm   Heart Rate (Exit) 89 bpm   Rating of Perceived Exertion (Exercise) 13   Duration Progress to 45 minutes of aerobic exercise without signs/symptoms of physical distress   Intensity THRR unchanged     Resistance Training   Training Prescription Yes   Weight 3   Reps 10-15     Treadmill   MPH 2.6   Grade 1   Minutes 15   METs 3.35     Recumbant Bike   Watts 30   Minutes 15   METs 3.13      History  Smoking Status  . Former Smoker  . Packs/day: 0.50  . Years: 40.00  . Types: Cigarettes  Smokeless Tobacco  . Never Used    Comment: Smokes 1/2 a pack per day. Would like to quit again at some point, but he enjoys it even though he knows it is bad for him.     Goals Met:  Proper associated with RPD/PD & O2 Sat Exercise tolerated well No report of cardiac concerns or symptoms     Dr. Emily Filbert is Medical Director for [facility] and [facility].

## 2016-05-18 DIAGNOSIS — C7951 Secondary malignant neoplasm of bone: Secondary | ICD-10-CM | POA: Diagnosis not present

## 2016-05-18 DIAGNOSIS — H04123 Dry eye syndrome of bilateral lacrimal glands: Secondary | ICD-10-CM | POA: Diagnosis not present

## 2016-05-18 DIAGNOSIS — Z955 Presence of coronary angioplasty implant and graft: Secondary | ICD-10-CM | POA: Diagnosis not present

## 2016-05-18 DIAGNOSIS — I10 Essential (primary) hypertension: Secondary | ICD-10-CM | POA: Diagnosis not present

## 2016-05-18 DIAGNOSIS — C61 Malignant neoplasm of prostate: Secondary | ICD-10-CM | POA: Diagnosis not present

## 2016-05-18 DIAGNOSIS — E7800 Pure hypercholesterolemia, unspecified: Secondary | ICD-10-CM | POA: Diagnosis not present

## 2016-05-18 DIAGNOSIS — I213 ST elevation (STEMI) myocardial infarction of unspecified site: Secondary | ICD-10-CM

## 2016-05-18 DIAGNOSIS — H02825 Cysts of left lower eyelid: Secondary | ICD-10-CM | POA: Diagnosis not present

## 2016-05-18 DIAGNOSIS — H524 Presbyopia: Secondary | ICD-10-CM | POA: Diagnosis not present

## 2016-05-18 DIAGNOSIS — H5203 Hypermetropia, bilateral: Secondary | ICD-10-CM | POA: Diagnosis not present

## 2016-05-18 DIAGNOSIS — I2102 ST elevation (STEMI) myocardial infarction involving left anterior descending coronary artery: Secondary | ICD-10-CM | POA: Diagnosis not present

## 2016-05-18 DIAGNOSIS — H52223 Regular astigmatism, bilateral: Secondary | ICD-10-CM | POA: Diagnosis not present

## 2016-05-18 DIAGNOSIS — I25118 Atherosclerotic heart disease of native coronary artery with other forms of angina pectoris: Secondary | ICD-10-CM | POA: Diagnosis not present

## 2016-05-18 DIAGNOSIS — F17200 Nicotine dependence, unspecified, uncomplicated: Secondary | ICD-10-CM | POA: Diagnosis not present

## 2016-05-18 DIAGNOSIS — H2513 Age-related nuclear cataract, bilateral: Secondary | ICD-10-CM | POA: Diagnosis not present

## 2016-05-18 NOTE — Progress Notes (Signed)
Daily Session Note  Patient Details  Name: Robert Short MRN: [id number] Date of Birth: [birth year] Referring Provider:    Encounter Date: 05/18/2016  Check In:     Session Check In - 05/18/16 1654      Check-In   Location [facility]   Staff Present Gerlene Burdock, RN, Vickki Hearing, BA, ACSM CEP, Exercise Physiologist;Kelly Amedeo Plenty, BS, ACSM CEP, Exercise Physiologist   Supervising physician immediately available to respond to emergencies See telemetry face sheet for immediately available ER MD   Medication changes reported     No   Fall or balance concerns reported    No   Warm-up and Cool-down Performed on first and last piece of equipment   Resistance Training Performed Yes   VAD Patient? No     Pain Assessment   Currently in Pain? No/denies         History  Smoking Status  . Former Smoker  . Packs/day: 0.50  . Years: 40.00  . Types: Cigarettes  Smokeless Tobacco  . Never Used    Comment: Smokes 1/2 a pack per day. Would like to quit again at some point, but he enjoys it even though he knows it is bad for him.     Goals Met:  Independence with exercise equipment Exercise tolerated well No report of cardiac concerns or symptoms Strength training completed today  Goals Unmet:  Not Applicable  Comments: Pt able to follow exercise prescription today without complaint.  Will continue to monitor for progression.    Dr. Emily Filbert is Medical Director for [place] and [facility].

## 2016-05-24 ENCOUNTER — Encounter: Payer: Self-pay | Admitting: *Deleted

## 2016-05-24 DIAGNOSIS — Z955 Presence of coronary angioplasty implant and graft: Secondary | ICD-10-CM

## 2016-05-24 DIAGNOSIS — I213 ST elevation (STEMI) myocardial infarction of unspecified site: Secondary | ICD-10-CM
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
## 2016-05-26 ENCOUNTER — Inpatient Hospital Stay: Payer: 59 | Attending: Internal Medicine

## 2016-05-26 DIAGNOSIS — F329 Major depressive disorder, single episode, unspecified: Secondary | ICD-10-CM | POA: Diagnosis not present

## 2016-05-26 DIAGNOSIS — Z7982 Long term (current) use of aspirin: Secondary | ICD-10-CM | POA: Diagnosis not present

## 2016-05-26 DIAGNOSIS — C7951 Secondary malignant neoplasm of bone: Secondary | ICD-10-CM | POA: Insufficient documentation

## 2016-05-26 DIAGNOSIS — I252 Old myocardial infarction: Secondary | ICD-10-CM | POA: Insufficient documentation

## 2016-05-26 DIAGNOSIS — C61 Malignant neoplasm of prostate: Secondary | ICD-10-CM | POA: Diagnosis not present

## 2016-05-26 DIAGNOSIS — Z79818 Long term (current) use of other agents affecting estrogen receptors and estrogen levels: Secondary | ICD-10-CM | POA: Diagnosis not present

## 2016-05-26 DIAGNOSIS — Z79899 Other long term (current) drug therapy: Secondary | ICD-10-CM | POA: Diagnosis not present

## 2016-05-26 DIAGNOSIS — I10 Essential (primary) hypertension: Secondary | ICD-10-CM | POA: Diagnosis not present

## 2016-05-26 DIAGNOSIS — Z87442 Personal history of urinary calculi: Secondary | ICD-10-CM | POA: Insufficient documentation

## 2016-05-26 DIAGNOSIS — Z955 Presence of coronary angioplasty implant and graft: Secondary | ICD-10-CM | POA: Diagnosis not present

## 2016-05-26 DIAGNOSIS — K5900 Constipation, unspecified: Secondary | ICD-10-CM | POA: Insufficient documentation

## 2016-05-26 DIAGNOSIS — I200 Unstable angina: Secondary | ICD-10-CM | POA: Diagnosis not present

## 2016-05-26 DIAGNOSIS — G893 Neoplasm related pain (acute) (chronic): Secondary | ICD-10-CM | POA: Diagnosis not present

## 2016-05-26 LAB — CBC WITH DIFFERENTIAL/PLATELET
Basophils Absolute: 0 10*3/uL (ref 0–0.1)
Basophils Relative: 1 %
EOS ABS: 0.1 10*3/uL (ref 0–0.7)
Eosinophils Relative: 1 %
HEMATOCRIT: 38.1 % — AB (ref 40.0–52.0)
HEMOGLOBIN: 12.7 g/dL — AB (ref 13.0–18.0)
LYMPHS ABS: 0.8 10*3/uL — AB (ref 1.0–3.6)
Lymphocytes Relative: 10 %
MCH: 31.4 pg (ref 26.0–34.0)
MCHC: 33.4 g/dL (ref 32.0–36.0)
MCV: 94.1 fL (ref 80.0–100.0)
MONOS PCT: 7 %
Monocytes Absolute: 0.6 10*3/uL (ref 0.2–1.0)
NEUTROS PCT: 81 %
Neutro Abs: 6.7 10*3/uL — ABNORMAL HIGH (ref 1.4–6.5)
Platelets: 141 10*3/uL — ABNORMAL LOW (ref 150–440)
RBC: 4.05 MIL/uL — ABNORMAL LOW (ref 4.40–5.90)
RDW: 14.7 % — ABNORMAL HIGH (ref 11.5–14.5)
WBC: 8.2 10*3/uL (ref 3.8–10.6)

## 2016-05-26 LAB — COMPREHENSIVE METABOLIC PANEL
ALT: 18 U/L (ref 17–63)
AST: 24 U/L (ref 15–41)
Albumin: 4.3 g/dL (ref 3.5–5.0)
Alkaline Phosphatase: 49 U/L (ref 38–126)
Anion gap: 8 (ref 5–15)
BUN: 18 mg/dL (ref 6–20)
CO2: 24 mmol/L (ref 22–32)
Calcium: 9.3 mg/dL (ref 8.9–10.3)
Chloride: 106 mmol/L (ref 101–111)
Creatinine, Ser: 0.9 mg/dL (ref 0.61–1.24)
GFR calc Af Amer: >60 mL/min (ref >60)
GFR calc non Af Amer: >60 mL/min (ref >60)
Glucose, Bld: 151 mg/dL — ABNORMAL HIGH (ref 65–99)
Potassium: 3.5 mmol/L (ref 3.5–5.1)
Sodium: 138 mmol/L (ref 135–145)
Total Bilirubin: 0.6 mg/dL (ref 0.3–1.2)
Total Protein: 7.1 g/dL (ref 6.5–8.1)

## 2016-05-26 LAB — PSA: PSA: 3.38 ng/mL (ref 0.00–4.00)

## 2016-05-30 ENCOUNTER — Inpatient Hospital Stay: Payer: 59

## 2016-05-30 ENCOUNTER — Inpatient Hospital Stay (HOSPITAL_BASED_OUTPATIENT_CLINIC_OR_DEPARTMENT_OTHER): Payer: 59 | Admitting: Internal Medicine

## 2016-05-30 ENCOUNTER — Other Ambulatory Visit: Payer: Self-pay | Admitting: *Deleted

## 2016-05-30 VITALS — BP 169/95 | HR 72 | Temp 97.8°F | Resp 20 | Ht 70.0 in | Wt 186.7 lb

## 2016-05-30 DIAGNOSIS — Z79818 Long term (current) use of other agents affecting estrogen receptors and estrogen levels: Secondary | ICD-10-CM

## 2016-05-30 DIAGNOSIS — I200 Unstable angina: Secondary | ICD-10-CM

## 2016-05-30 DIAGNOSIS — G893 Neoplasm related pain (acute) (chronic): Secondary | ICD-10-CM | POA: Diagnosis not present

## 2016-05-30 DIAGNOSIS — I252 Old myocardial infarction: Secondary | ICD-10-CM

## 2016-05-30 DIAGNOSIS — Z79899 Other long term (current) drug therapy: Secondary | ICD-10-CM | POA: Diagnosis not present

## 2016-05-30 DIAGNOSIS — C7951 Secondary malignant neoplasm of bone: Secondary | ICD-10-CM

## 2016-05-30 DIAGNOSIS — C61 Malignant neoplasm of prostate: Secondary | ICD-10-CM | POA: Diagnosis not present

## 2016-05-30 DIAGNOSIS — K5900 Constipation, unspecified: Secondary | ICD-10-CM | POA: Diagnosis not present

## 2016-05-30 DIAGNOSIS — I10 Essential (primary) hypertension: Secondary | ICD-10-CM | POA: Diagnosis not present

## 2016-05-30 MED ORDER — DENOSUMAB 120 MG/1.7ML ~~LOC~~ SOLN
120.0000 mg | Freq: Once | SUBCUTANEOUS | Status: AC
  Administered 2016-05-30: 120 mg via SUBCUTANEOUS
  Filled 2016-05-30: qty 1.7

## 2016-05-30 MED ORDER — OXYCODONE HCL 10 MG PO TABS: ORAL_TABLET | ORAL | 0 refills | Status: DC

## 2016-05-30 MED ORDER — FENTANYL 100 MCG/HR TD PT72: 100.0000 ug | MEDICATED_PATCH | TRANSDERMAL | 0 refills | Status: DC

## 2016-05-30 MED ORDER — LEUPROLIDE ACETATE (3 MONTH) 22.5 MG IM KIT
22.5000 mg | PACK | Freq: Once | INTRAMUSCULAR | Status: AC
  Administered 2016-05-30: 22.5 mg via INTRAMUSCULAR
  Filled 2016-05-30: qty 22.5

## 2016-05-30 NOTE — Assessment & Plan Note (Addendum)
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  Jan 2018- bone scan- no significant active disease. May 2018- PSA 3.12/improving. Recommend increasing the dose of to Zytiga 4 pills/prednisone BID. [pt currently on 2 pills/day].   # X-geva/ Lupron every 3 months [last may 22nd 2018]- on ca+vit D. No AEs noted. Ca- wnl.    # Back pain secondary malignancy status post radiation; improved; continue current regimen- fentanyl 100 g every 72 hours; continue oxycodone 10 mg every 8-12 hours. New scripts given. I would not recommend increasing patient's pain regimen at this time. Continue ice packs for acute low back pain-likely muscular skeletal.  # recent MI [April 2018; [place]]- s/p stenting; on Plavix; stable.  # Follow-up in 6 weeks; labs few days prior.

## 2016-05-30 NOTE — Progress Notes (Signed)
[place] OFFICE PROGRESS NOTE  Patient Care Team: Olin Hauser, DO as PCP - General (Family Medicine) Minna Merritts, MD as Consulting Physician (Cardiology)   SUMMARY OF ONCOLOGIC HISTORY: Oncology History   # 2011- PROSTATE CANCER [Gleason 3+4]; s/p Prostatectomy [ also involved bladder neck/ECP; Dr.Polaseck]; July 2014- Biochemical recurrence [PSA 14]- started on Zoladex [Dr.Pandit]; lost to follow up.  # JAN 2017- STAGE IV METASTATIC PROSTATE Cancer to Bone- Feb 13th, 2017-  Lupron q 17m [~end of feb]; PSA: 1021; Declined Chemo; April 2017 [xofigo x6; Dr.Crystal]; AUG 2017- Zytiga + Prednisone BID. Bone scan-Jan 2018- improved skeletal metastases.   # Mets to bone- start X-geva q 64M [May 30th]  # Smoker/ chronic pain/pain clinic      Prostate cancer metastatic to bone ([place])   12/08/2014 Initial Diagnosis    Prostate cancer metastatic to bone [facility])       INTERVAL HISTORY:   66 year old pleasant Caucasian male patient  With above history of prostate cancer metastatic castrate resistant currently on Lupron + Zytiga is here for follow-up.   Patient's had interruption of his Zytiga earlier given his recent MI. He started back on Zytiga approximately 4 weeks ago taking 2 pills a day.   Patient stated that he had been working on remodeling his house- noted to have sprained his lower back. He has been using ice packs- swelling is improved not resolved. Chronic mild constipation. Otherwise no swelling of the legs. No nausea no vomiting.  REVIEW OF SYSTEMS:  A complete 10 point review of system is done which is negative except mentioned above/history of present illness.   PAST MEDICAL HISTORY :  Past Medical History:  Diagnosis Date  . ASCVD (arteriosclerotic cardiovascular disease)   . Back pain   . Back pain 10/09/2012  . Bone cancer ([place])   . Cancer associated pain   . Depression   . History of kidney stones   . Hypertension   . Joint pain    . Prostate cancer ([place])   . Right arm pain 01/10/2016  . Right foot pain 01/10/2016  . Right leg pain 01/10/2016  . SOB (shortness of breath) 10/09/2012  . Unable to ambulate 10/09/2012    PAST SURGICAL HISTORY :   Past Surgical History:  Procedure Laterality Date  . CARDIAC CATHETERIZATION     [facility]  . CARDIAC CATHETERIZATION N/A 08/16/2015   Procedure: Left Heart Cath and Coronary Angiography;  Surgeon: Yolonda Kida, MD;  Location: [facility];  Service: Cardiovascular;  Laterality: N/A;  . CARDIAC CATHETERIZATION N/A 08/16/2015   Procedure: Coronary Stent Intervention;  Surgeon: Yolonda Kida, MD;  Location: [facility];  Service: Cardiovascular;  Laterality: N/A;  . CERVIX SURGERY    . PROSTATECTOMY    . SPINE SURGERY    . TONSILLECTOMY    . WRIST SURGERY      FAMILY HISTORY :   Family History  Problem Relation Age of Onset  . Heart attack Mother   . Hypertension Mother   . Heart attack Father     SOCIAL HISTORY:   Social History  Substance Use Topics  . Smoking status: Former Smoker    Packs/day: 0.50    Years: 40.00    Types: Cigarettes  . Smokeless tobacco: Never Used     Comment: Smokes 1/2 a pack per day. Would like to quit again at some point, but he enjoys it even though he knows it is bad for  him.   . Alcohol use No    ALLERGIES:  has No Known Allergies.  MEDICATIONS:  Current Outpatient Prescriptions  Medication Sig Dispense Refill  . abiraterone Acetate (ZYTIGA) 250 MG tablet Take 4 tablets (1,000 mg total) by mouth daily. Take on an empty stomach 1 hour before or 2 hours after a meal 120 tablet 4  . acetaminophen (TYLENOL) 500 MG tablet Take 1,500 mg by mouth every 8 (eight) hours as needed.     Marland Kitchen aspirin 81 MG tablet Take 81 mg by mouth daily.    Marland Kitchen atorvastatin (LIPITOR) 80 MG tablet Take 1 tablet (80 mg total) by mouth daily at 6 PM. 90 tablet 3  . clopidogrel (PLAVIX) 75 MG tablet Take 1 tablet (75 mg total) by mouth  daily. 90 tablet 3  . polyethylene glycol (MIRALAX / GLYCOLAX) packet Take 17 g by mouth daily as needed for moderate constipation.    . predniSONE (DELTASONE) 5 MG tablet Take 5 mg by mouth 2 (two) times daily with a meal.    . Wheat Dextrin (BENEFIBER) POWD Stir 2 tsp. TID into 4-8 oz of any non-carbonated beverage or soft food (hot or cold) 500 g PRN  . fentaNYL (DURAGESIC - DOSED MCG/HR) 100 MCG/HR Place 1 patch (100 mcg total) onto the skin every 3 (three) days. 10 patch 0  . nitroGLYCERIN (NITROSTAT) 0.4 MG SL tablet Place under the tongue.    Marland Kitchen Oxycodone HCl 10 MG TABS One pill every 8 hours as needed for pain 90 tablet 0   No current facility-administered medications for this visit.    Facility-Administered Medications Ordered in Other Visits  Medication Dose Route Frequency Provider Last Rate Last Dose  . denosumab (XGEVA) injection 120 mg  120 mg Subcutaneous Once Charlaine Dalton R, MD      . leuprolide (LUPRON) injection 22.5 mg  22.5 mg Intramuscular Once [name], MD        PHYSICAL EXAMINATION: ECOG PERFORMANCE STATUS: 1 - Symptomatic but completely ambulatory  BP (!) 169/95 (BP Location: Right Arm, Patient Position: Sitting)   Pulse 72   Temp 97.8 F (36.6 C) (Tympanic)   Resp 20   Ht 5\' 10"  (1.778 m)   Wt 186 lb 11.2 oz (84.7 kg)   BMI 26.79 kg/m   Filed Weights   05/30/16 1339  Weight: 186 lb 11.2 oz (84.7 kg)   GENERAL: Well-nourished well-developed; Alert, no distress and comfortable. He is Accompanied by his wife. Walks with a cane. EYES: no pallor or icterus OROPHARYNX: no thrush or ulceration; Upper dentures. NECK: supple, no masses felt LYMPH: no palpable lymphadenopathy in the cervical, axillary or inguinal regions LUNGS: clear to auscultation and No wheeze or crackles HEART/CVS: regular rate & rhythm and no murmurs; No lower extremity edema ABDOMEN:abdomen soft, non-tender and normal bowel sounds; No  hepatomegaly. Musculoskeletal:no cyanosis of digits and no clubbing; No significant tenderness noted. PSYCH: alert & oriented x 3 with fluent speech NEURO: no focal motor/sensory deficits SKIN: no rashes or significant lesions   LABORATORY DATA:  I have reviewed the data as listed    Component Value Date/Time   NA 138 05/26/2016 1349   NA 140 07/13/2013 1542   K 3.5 05/26/2016 1349   K 4.3 07/13/2013 1542   CL 106 05/26/2016 1349   CL 107 07/13/2013 1542   CO2 24 05/26/2016 1349   CO2 26 07/13/2013 1542   GLUCOSE 151 (H) 05/26/2016 1349   GLUCOSE 106 (H) 07/13/2013 1542  BUN 18 05/26/2016 1349   BUN 11 07/13/2013 1542   CREATININE 0.90 05/26/2016 1349   CREATININE 1.01 07/13/2013 1542   CALCIUM 9.3 05/26/2016 1349   CALCIUM 9.9 07/13/2013 1542   PROT 7.1 05/26/2016 1349   PROT 8.1 07/13/2013 1542   ALBUMIN 4.3 05/26/2016 1349   ALBUMIN 4.3 07/13/2013 1542   AST 24 05/26/2016 1349   AST 26 07/13/2013 1542   ALT 18 05/26/2016 1349   ALT 32 07/13/2013 1542   ALKPHOS 49 05/26/2016 1349   ALKPHOS 74 07/13/2013 1542   BILITOT 0.6 05/26/2016 1349   BILITOT 0.4 07/13/2013 1542   GFRNONAA >60 05/26/2016 1349   GFRNONAA >60 07/13/2013 1542   GFRAA >60 05/26/2016 1349   GFRAA >60 07/13/2013 1542    No results found for: SPEP, UPEP  Lab Results  Component Value Date   WBC 8.2 05/26/2016   NEUTROABS 6.7 (H) 05/26/2016   HGB 12.7 (L) 05/26/2016   HCT 38.1 (L) 05/26/2016   MCV 94.1 05/26/2016   PLT 141 (L) 05/26/2016      Chemistry      Component Value Date/Time   NA 138 05/26/2016 1349   NA 140 07/13/2013 1542   K 3.5 05/26/2016 1349   K 4.3 07/13/2013 1542   CL 106 05/26/2016 1349   CL 107 07/13/2013 1542   CO2 24 05/26/2016 1349   CO2 26 07/13/2013 1542   BUN 18 05/26/2016 1349   BUN 11 07/13/2013 1542   CREATININE 0.90 05/26/2016 1349   CREATININE 1.01 07/13/2013 1542      Component Value Date/Time   CALCIUM 9.3 05/26/2016 1349   CALCIUM 9.9  07/13/2013 1542   ALKPHOS 49 05/26/2016 1349   ALKPHOS 74 07/13/2013 1542   AST 24 05/26/2016 1349   AST 26 07/13/2013 1542   ALT 18 05/26/2016 1349   ALT 32 07/13/2013 1542   BILITOT 0.6 05/26/2016 1349   BILITOT 0.4 07/13/2013 1542     Results for Damron, Quindarius L (MRN [id number]) as of 02/08/2016 11:43  Ref. Range 08/17/2004 08:38 07/10/2005 16:47 07/19/2005 11:33 07/19/2005 16:45 03/15/2006 11:24 03/16/2006 11:17 03/22/2006 11:14 04/06/2008 15:16 02/09/2010 19:08 02/09/2010 20:17 02/09/2010 20:21 07/15/2012 13:47 08/15/2012 10:21 08/15/2012 11:12 08/15/2012 11:13 08/16/2012 10:15 08/16/2012 11:09 08/21/2012 11:00 08/21/2012 11:11 08/21/2012 12:12 10/09/2012 15:38 10/16/2012 10:10 10/16/2012 11:26 12/05/2012 10:07 04/19/2013 19:44 04/19/2013 19:47 04/19/2013 20:12 05/23/2013 13:41 07/11/2013 13:17 07/13/2013 15:42 07/13/2013 16:03 07/13/2013 20:59 07/24/2013 10:01 11/03/2013 16:19 09/29/2014 13:52 12/07/2014 00:00 01/29/2015 00:00 02/01/2015 14:50 02/04/2015 10:50 02/04/2015 13:25 02/08/2015 09:52 02/11/2015 10:47 03/16/2015 10:32 04/06/2015 16:17 04/20/2015 13:28 04/27/2015 11:49 05/19/2015 13:35 05/24/2015 15:02 05/26/2015 11:18 06/01/2015 11:35 06/17/2015 12:52 06/22/2015 15:14 07/14/2015 13:25 08/10/2015 14:03 08/16/2015 16:19 08/16/2015 16:19 08/16/2015 16:19 08/16/2015 16:22 08/16/2015 16:26 08/16/2015 16:30 08/16/2015 17:06 08/16/2015 18:33 08/16/2015 21:13 08/17/2015 04:16 08/17/2015 05:16 08/17/2015 15:43 08/17/2015 20:52 08/18/2015 17:10 09/15/2015 14:25 10/05/2015 13:44 11/09/2015 00:00 11/18/2015 13:35 12/18/2015 03:47 12/18/2015 04:05 01/04/2016 13:25 01/10/2016 14:42 01/11/2016 14:33 02/03/2016 13:51  PSA Latest Ref Range: 0.00 - 4.00 ng/mL            14.2 (H) 14.6 (H)           0.2                 1,021.00 (H)     33.39 (H)    43.62 (H)   14.08 (H) 15.26 (H)                1.40  3.03   6.79 (  H)   10.47 (H)    ASSESSMENT & PLAN:    Prostate cancer metastatic to bone ([facility]) Castrate resistant prostate cancer-  Patient currently on Lupron androgen deprivation therapy; on  Zytiga- since Aug 2017.  Jan 2018- bone scan- no significant active disease. May 2018- PSA 3.12/improving. Recommend increasing the dose of to Zytiga 4 pills/prednisone BID. [pt currently on 2 pills/day].   # X-geva/ Lupron every 3 months [last may 22nd 2018]- on ca+vit D. No AEs noted. Ca- wnl.    # Back pain secondary malignancy status post radiation; improved; continue current regimen- fentanyl 100 g every 72 hours; continue oxycodone 10 mg every 8-12 hours. New scripts given. I would not recommend increasing patient's pain regimen at this time. Continue ice packs for acute low back pain-likely muscular skeletal.  # recent MI [April 2018; [place]]- s/p stenting; on Plavix; stable.  # Follow-up in 6 weeks; labs few days prior.      [name], MD 05/30/2016 2:12 PM

## 2016-05-31 DIAGNOSIS — E7800 Pure hypercholesterolemia, unspecified: Secondary | ICD-10-CM | POA: Diagnosis not present

## 2016-05-31 DIAGNOSIS — Z955 Presence of coronary angioplasty implant and graft: Secondary | ICD-10-CM | POA: Diagnosis not present

## 2016-05-31 DIAGNOSIS — I213 ST elevation (STEMI) myocardial infarction of unspecified site: Secondary | ICD-10-CM

## 2016-05-31 DIAGNOSIS — I10 Essential (primary) hypertension: Secondary | ICD-10-CM | POA: Diagnosis not present

## 2016-05-31 DIAGNOSIS — F17200 Nicotine dependence, unspecified, uncomplicated: Secondary | ICD-10-CM | POA: Diagnosis not present

## 2016-05-31 DIAGNOSIS — I2102 ST elevation (STEMI) myocardial infarction involving left anterior descending coronary artery: Secondary | ICD-10-CM | POA: Diagnosis not present

## 2016-05-31 DIAGNOSIS — I25118 Atherosclerotic heart disease of native coronary artery with other forms of angina pectoris: Secondary | ICD-10-CM | POA: Diagnosis not present

## 2016-05-31 DIAGNOSIS — C61 Malignant neoplasm of prostate: Secondary | ICD-10-CM | POA: Diagnosis not present

## 2016-05-31 DIAGNOSIS — C7951 Secondary malignant neoplasm of bone: Secondary | ICD-10-CM | POA: Diagnosis not present

## 2016-05-31 NOTE — Progress Notes (Signed)
Daily Session Note  Patient Details  Name: Robert Short MRN: [id number] Date of Birth: 1950/12/06 Referring Provider:    Encounter Date: 05/31/2016  Check In:     Session Check In - 05/31/16 1635      Check-In   Location [facility]   Staff Present Gerlene Burdock, RN, Vickki Hearing, BA, ACSM CEP, Exercise Physiologist;Other  Renita Papa RN   Supervising physician immediately available to respond to emergencies See telemetry face sheet for immediately available ER MD   Medication changes reported     No   Fall or balance concerns reported    No   Tobacco Cessation Use Decreased   Warm-up and Cool-down Performed on first and last piece of equipment   Resistance Training Performed Yes   VAD Patient? No     Pain Assessment   Currently in Pain? No/denies         History  Smoking Status  . Former Smoker  . Packs/day: 0.25  . Years: 40.00  . Types: Cigarettes  Smokeless Tobacco  . Never Used    Comment: Smokes 1/2 a pack per day. Would like to quit again at some point, but he enjoys it even though he knows it is bad for him.     Goals Met:  Independence with exercise equipment Exercise tolerated well No report of cardiac concerns or symptoms Strength training completed today  Goals Unmet:  Not Applicable  Comments: Pt able to follow exercise prescription today without complaint.  Will continue to monitor for progression.    Dr. Emily Filbert is Medical Director for [place] and [facility].

## 2016-06-01 ENCOUNTER — Encounter: Payer: Self-pay | Admitting: *Deleted

## 2016-06-01 ENCOUNTER — Encounter: Payer: 59 | Admitting: *Deleted

## 2016-06-01 DIAGNOSIS — F17200 Nicotine dependence, unspecified, uncomplicated: Secondary | ICD-10-CM | POA: Diagnosis not present

## 2016-06-01 DIAGNOSIS — I2102 ST elevation (STEMI) myocardial infarction involving left anterior descending coronary artery: Secondary | ICD-10-CM | POA: Diagnosis not present

## 2016-06-01 DIAGNOSIS — C61 Malignant neoplasm of prostate: Secondary | ICD-10-CM | POA: Diagnosis not present

## 2016-06-01 DIAGNOSIS — I213 ST elevation (STEMI) myocardial infarction of unspecified site: Secondary | ICD-10-CM

## 2016-06-01 DIAGNOSIS — E7800 Pure hypercholesterolemia, unspecified: Secondary | ICD-10-CM | POA: Diagnosis not present

## 2016-06-01 DIAGNOSIS — I25118 Atherosclerotic heart disease of native coronary artery with other forms of angina pectoris: Secondary | ICD-10-CM | POA: Diagnosis not present

## 2016-06-01 DIAGNOSIS — C7951 Secondary malignant neoplasm of bone: Secondary | ICD-10-CM | POA: Diagnosis not present

## 2016-06-01 DIAGNOSIS — Z955 Presence of coronary angioplasty implant and graft: Secondary | ICD-10-CM

## 2016-06-01 DIAGNOSIS — I10 Essential (primary) hypertension: Secondary | ICD-10-CM | POA: Diagnosis not present

## 2016-06-01 NOTE — Progress Notes (Signed)
Daily Session Note  Patient Details  Name: Robert Short MRN: [id number] Date of Birth: 28-Sep-1950 Referring Provider:    Encounter Date: 06/01/2016  Check In:     Session Check In - 06/01/16 1713      Check-In   Tobacco Cessation Use Decreased           Exercise Prescription Changes - 06/01/16 1000      Response to Exercise   Blood Pressure (Admit) 144/92   Blood Pressure (Exercise) 144/80   Blood Pressure (Exit) 134/62   Heart Rate (Admit) 62 bpm   Heart Rate (Exercise) 105 bpm   Heart Rate (Exit) 75 bpm   Rating of Perceived Exertion (Exercise) 12   Duration Progress to 45 minutes of aerobic exercise without signs/symptoms of physical distress   Intensity THRR unchanged     Progression   Progression Continue to progress workloads to maintain intensity without signs/symptoms of physical distress.     Resistance Training   Training Prescription Yes   Weight 3   Reps 10-15     Interval Training   Interval Training No     Recumbant Bike   Watts 30   Minutes 15   METs 3.13     T5 Nustep   Level 3   SPM 80   Minutes 15   METs 2.2      History  Smoking Status  . Current Every Day Smoker  . Packs/day: 0.25  . Years: 40.00  . Types: Cigarettes  Smokeless Tobacco  . Never Used    Comment: Down to 3 cigarettes a day     Goals Met:  Exercise tolerated well No report of cardiac concerns or symptoms Strength training completed today  Goals Unmet:  Not Applicable  Comments: down to 3 cigs a day  Doing well with exercise prescription progression.    Dr. Emily Filbert is Medical Director for [place] and [facility].

## 2016-06-05 ENCOUNTER — Encounter: Payer: 59 | Attending: Cardiovascular Disease

## 2016-06-05 DIAGNOSIS — F17200 Nicotine dependence, unspecified, uncomplicated: Secondary | ICD-10-CM | POA: Insufficient documentation

## 2016-06-05 DIAGNOSIS — E7800 Pure hypercholesterolemia, unspecified: Secondary | ICD-10-CM | POA: Insufficient documentation

## 2016-06-05 DIAGNOSIS — Z955 Presence of coronary angioplasty implant and graft: Secondary | ICD-10-CM | POA: Insufficient documentation

## 2016-06-05 DIAGNOSIS — I10 Essential (primary) hypertension: Secondary | ICD-10-CM | POA: Insufficient documentation

## 2016-06-05 DIAGNOSIS — I25118 Atherosclerotic heart disease of native coronary artery with other forms of angina pectoris: Secondary | ICD-10-CM | POA: Insufficient documentation

## 2016-06-05 DIAGNOSIS — I2102 ST elevation (STEMI) myocardial infarction involving left anterior descending coronary artery: Secondary | ICD-10-CM | POA: Insufficient documentation

## 2016-06-05 DIAGNOSIS — C61 Malignant neoplasm of prostate: Secondary | ICD-10-CM | POA: Insufficient documentation

## 2016-06-05 DIAGNOSIS — C7951 Secondary malignant neoplasm of bone: Secondary | ICD-10-CM | POA: Insufficient documentation

## 2016-06-06 ENCOUNTER — Telehealth: Payer: Self-pay | Admitting: Family Medicine

## 2016-06-06 NOTE — Telephone Encounter (Signed)
Called pt to schedule Annual Wellness Visit with Nurse Health Advisor for 7/10:  - knb

## 2016-06-15 ENCOUNTER — Telehealth: Payer: Self-pay

## 2016-06-15 NOTE — Telephone Encounter (Signed)
LMOM

## 2016-06-21 ENCOUNTER — Encounter: Payer: Self-pay | Admitting: *Deleted

## 2016-06-21 DIAGNOSIS — Z955 Presence of coronary angioplasty implant and graft: Secondary | ICD-10-CM

## 2016-06-21 DIAGNOSIS — I213 ST elevation (STEMI) myocardial infarction of unspecified site: Secondary | ICD-10-CM
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
## 2016-06-27 ENCOUNTER — Other Ambulatory Visit: Payer: Self-pay | Admitting: *Deleted

## 2016-06-27 ENCOUNTER — Telehealth: Payer: Self-pay | Admitting: *Deleted

## 2016-06-27 ENCOUNTER — Encounter: Payer: Self-pay | Admitting: *Deleted

## 2016-06-27 DIAGNOSIS — C61 Malignant neoplasm of prostate: Secondary | ICD-10-CM

## 2016-06-27 DIAGNOSIS — I213 ST elevation (STEMI) myocardial infarction of unspecified site: Secondary | ICD-10-CM

## 2016-06-27 DIAGNOSIS — G893 Neoplasm related pain (acute) (chronic): Secondary | ICD-10-CM

## 2016-06-27 DIAGNOSIS — Z955 Presence of coronary angioplasty implant and graft: Secondary | ICD-10-CM

## 2016-06-27 DIAGNOSIS — C7951 Secondary malignant neoplasm of bone: Secondary | ICD-10-CM

## 2016-06-27 MED ORDER — OXYCODONE HCL 10 MG PO TABS: ORAL_TABLET | ORAL | 0 refills | Status: DC

## 2016-06-27 MED ORDER — FENTANYL 100 MCG/HR TD PT72: 100.0000 ug | MEDICATED_PATCH | TRANSDERMAL | 0 refills | Status: DC

## 2016-06-27 NOTE — Telephone Encounter (Signed)
Called to check on status of return to program.  Left message on voicemail on cell phone.

## 2016-07-03 ENCOUNTER — Encounter: Payer: 59 | Attending: Cardiovascular Disease

## 2016-07-03 DIAGNOSIS — I2102 ST elevation (STEMI) myocardial infarction involving left anterior descending coronary artery: Secondary | ICD-10-CM | POA: Insufficient documentation

## 2016-07-03 DIAGNOSIS — Z955 Presence of coronary angioplasty implant and graft: Secondary | ICD-10-CM | POA: Insufficient documentation

## 2016-07-03 DIAGNOSIS — E7800 Pure hypercholesterolemia, unspecified: Secondary | ICD-10-CM | POA: Insufficient documentation

## 2016-07-03 DIAGNOSIS — F17200 Nicotine dependence, unspecified, uncomplicated: Secondary | ICD-10-CM | POA: Insufficient documentation

## 2016-07-03 DIAGNOSIS — C7951 Secondary malignant neoplasm of bone: Secondary | ICD-10-CM | POA: Insufficient documentation

## 2016-07-03 DIAGNOSIS — C61 Malignant neoplasm of prostate: Secondary | ICD-10-CM | POA: Insufficient documentation

## 2016-07-03 DIAGNOSIS — I10 Essential (primary) hypertension: Secondary | ICD-10-CM | POA: Insufficient documentation

## 2016-07-03 DIAGNOSIS — I25118 Atherosclerotic heart disease of native coronary artery with other forms of angina pectoris: Secondary | ICD-10-CM | POA: Insufficient documentation

## 2016-07-03 MED FILL — ZYTIGA 250 MG TABLET: 250 | 30 days supply | Qty: 120 | Fill #1

## 2016-07-06 ENCOUNTER — Inpatient Hospital Stay: Payer: 59 | Attending: Internal Medicine

## 2016-07-06 DIAGNOSIS — Z955 Presence of coronary angioplasty implant and graft: Secondary | ICD-10-CM | POA: Diagnosis not present

## 2016-07-06 DIAGNOSIS — F329 Major depressive disorder, single episode, unspecified: Secondary | ICD-10-CM | POA: Insufficient documentation

## 2016-07-06 DIAGNOSIS — C61 Malignant neoplasm of prostate: Secondary | ICD-10-CM | POA: Diagnosis not present

## 2016-07-06 DIAGNOSIS — Z7902 Long term (current) use of antithrombotics/antiplatelets: Secondary | ICD-10-CM | POA: Insufficient documentation

## 2016-07-06 DIAGNOSIS — Z923 Personal history of irradiation: Secondary | ICD-10-CM | POA: Insufficient documentation

## 2016-07-06 DIAGNOSIS — I2510 Atherosclerotic heart disease of native coronary artery without angina pectoris: Secondary | ICD-10-CM | POA: Insufficient documentation

## 2016-07-06 DIAGNOSIS — Z87442 Personal history of urinary calculi: Secondary | ICD-10-CM | POA: Diagnosis not present

## 2016-07-06 DIAGNOSIS — I10 Essential (primary) hypertension: Secondary | ICD-10-CM | POA: Insufficient documentation

## 2016-07-06 DIAGNOSIS — C7951 Secondary malignant neoplasm of bone: Secondary | ICD-10-CM | POA: Diagnosis not present

## 2016-07-06 DIAGNOSIS — Z7982 Long term (current) use of aspirin: Secondary | ICD-10-CM | POA: Diagnosis not present

## 2016-07-06 DIAGNOSIS — I252 Old myocardial infarction: Secondary | ICD-10-CM | POA: Insufficient documentation

## 2016-07-06 DIAGNOSIS — Z9079 Acquired absence of other genital organ(s): Secondary | ICD-10-CM | POA: Diagnosis not present

## 2016-07-06 DIAGNOSIS — F17210 Nicotine dependence, cigarettes, uncomplicated: Secondary | ICD-10-CM | POA: Insufficient documentation

## 2016-07-06 DIAGNOSIS — G893 Neoplasm related pain (acute) (chronic): Secondary | ICD-10-CM

## 2016-07-06 LAB — PSA: Prostatic Specific Antigen: 3.33 ng/mL (ref 0.00–4.00)

## 2016-07-06 LAB — COMPREHENSIVE METABOLIC PANEL
ALBUMIN: 4.4 g/dL (ref 3.5–5.0)
ALK PHOS: 48 U/L (ref 38–126)
ALT: 14 U/L — ABNORMAL LOW (ref 17–63)
ANION GAP: 9 (ref 5–15)
AST: 19 U/L (ref 15–41)
BILIRUBIN TOTAL: 0.6 mg/dL (ref 0.3–1.2)
BUN: 13 mg/dL (ref 6–20)
CALCIUM: 9.5 mg/dL (ref 8.9–10.3)
CO2: 26 mmol/L (ref 22–32)
Chloride: 105 mmol/L (ref 101–111)
Creatinine, Ser: 0.79 mg/dL (ref 0.61–1.24)
GFR calc Af Amer: >60 mL/min (ref >60)
GFR calc non Af Amer: >60 mL/min (ref >60)
GLUCOSE: 117 mg/dL — AB (ref 65–99)
POTASSIUM: 3.9 mmol/L (ref 3.5–5.1)
Sodium: 140 mmol/L (ref 135–145)
Total Protein: 6.9 g/dL (ref 6.5–8.1)

## 2016-07-06 LAB — CBC WITH DIFFERENTIAL/PLATELET
BASOS ABS: 0 10*3/uL (ref 0–0.1)
BASOS PCT: 0 %
EOS ABS: 0 10*3/uL (ref 0–0.7)
Eosinophils Relative: 1 %
HEMATOCRIT: 39 % — AB (ref 40.0–52.0)
HEMOGLOBIN: 13.2 g/dL (ref 13.0–18.0)
Lymphocytes Relative: 14 %
Lymphs Abs: 0.7 10*3/uL — ABNORMAL LOW (ref 1.0–3.6)
MCH: 31.9 pg (ref 26.0–34.0)
MCHC: 33.7 g/dL (ref 32.0–36.0)
MCV: 94.6 fL (ref 80.0–100.0)
Monocytes Absolute: 0.4 10*3/uL (ref 0.2–1.0)
Monocytes Relative: 8 %
NEUTROS ABS: 4 10*3/uL (ref 1.4–6.5)
NEUTROS PCT: 77 %
Platelets: 164 10*3/uL (ref 150–440)
RBC: 4.12 MIL/uL — ABNORMAL LOW (ref 4.40–5.90)
RDW: 15.5 % — ABNORMAL HIGH (ref 11.5–14.5)
WBC: 5.2 10*3/uL (ref 3.8–10.6)

## 2016-07-10 ENCOUNTER — Telehealth: Payer: Self-pay | Admitting: Family Medicine

## 2016-07-10 NOTE — Telephone Encounter (Signed)
Called pt to schedule Annual Wellness Visit with [facility]  - knb  °

## 2016-07-11 ENCOUNTER — Ambulatory Visit: Payer: 59 | Admitting: Internal Medicine

## 2016-07-11 ENCOUNTER — Inpatient Hospital Stay (HOSPITAL_BASED_OUTPATIENT_CLINIC_OR_DEPARTMENT_OTHER): Payer: 59 | Admitting: Internal Medicine

## 2016-07-11 VITALS — BP 190/91 | HR 72 | Temp 98.4°F | Wt 187.5 lb

## 2016-07-11 DIAGNOSIS — C7951 Secondary malignant neoplasm of bone: Secondary | ICD-10-CM

## 2016-07-11 DIAGNOSIS — I252 Old myocardial infarction: Secondary | ICD-10-CM | POA: Diagnosis not present

## 2016-07-11 DIAGNOSIS — F17210 Nicotine dependence, cigarettes, uncomplicated: Secondary | ICD-10-CM

## 2016-07-11 DIAGNOSIS — Z7902 Long term (current) use of antithrombotics/antiplatelets: Secondary | ICD-10-CM | POA: Diagnosis not present

## 2016-07-11 DIAGNOSIS — F329 Major depressive disorder, single episode, unspecified: Secondary | ICD-10-CM | POA: Diagnosis not present

## 2016-07-11 DIAGNOSIS — C61 Malignant neoplasm of prostate: Secondary | ICD-10-CM | POA: Diagnosis not present

## 2016-07-11 DIAGNOSIS — Z923 Personal history of irradiation: Secondary | ICD-10-CM

## 2016-07-11 DIAGNOSIS — I2510 Atherosclerotic heart disease of native coronary artery without angina pectoris: Secondary | ICD-10-CM | POA: Diagnosis not present

## 2016-07-11 DIAGNOSIS — I10 Essential (primary) hypertension: Secondary | ICD-10-CM | POA: Diagnosis not present

## 2016-07-11 NOTE — Progress Notes (Signed)
[place] OFFICE PROGRESS NOTE  Patient Care Team: Olin Hauser, DO as PCP - General (Family Medicine) Minna Merritts, MD as Consulting Physician (Cardiology)   SUMMARY OF ONCOLOGIC HISTORY: Oncology History   # 2011- PROSTATE CANCER [Gleason 3+4]; s/p Prostatectomy [ also involved bladder neck/ECP; Dr.Polaseck]; July 2014- Biochemical recurrence [PSA 14]- started on Zoladex [Dr.Pandit]; lost to follow up.  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 Bone scan-Jan 2018- improved skeletal metastases.   # Mets to bone- start X-geva q 68M [May 30th]  # Smoker/ chronic pain/pain clinic      Prostate cancer metastatic to bone ([place])   12/08/2014 Initial Diagnosis    Prostate cancer metastatic to bone [facility])       INTERVAL HISTORY:   66 year old pleasant Caucasian male patient  With above history of prostate cancer metastatic castrate resistant currently on Lupron + Zytiga is here for follow-up.   Patient had tried taking 3 pills of the Zytiga once a day with prednisone; however because of nausea/dizziness- his currently taking only 2 pills a day.  Patient stated that he had been working on remodeling his house- noted to have sprained his lower back. She also states to have chronic pain in his right lateral ribs [for the last 3-4 years] however pain in the left lateral ribs more recently in the last 2-3 months. He noticed the pain left side worsened with heavy activity. Chronic mild constipation. Otherwise no swelling of the legs. No nausea no vomiting. He denies any jaw pain.  REVIEW OF SYSTEMS:  A complete 10 point review of system is done which is negative except mentioned above/history of present illness.   PAST MEDICAL HISTORY :  Past Medical History:  Diagnosis Date  . ASCVD (arteriosclerotic  cardiovascular disease)   . Back pain   . Back pain 10/09/2012  . Bone cancer ([place])   . Cancer associated pain   . Depression   . History of kidney stones   . Hypertension   . Joint pain   . Prostate cancer ([place])   . Right arm pain 01/10/2016  . Right foot pain 01/10/2016  . Right leg pain 01/10/2016  . SOB (shortness of breath) 10/09/2012  . Unable to ambulate 10/09/2012    PAST SURGICAL HISTORY :   Past Surgical History:  Procedure Laterality Date  . CARDIAC CATHETERIZATION     [facility]  . CARDIAC CATHETERIZATION N/A 08/16/2015   Procedure: Left Heart Cath and Coronary Angiography;  Surgeon: Yolonda Kida, MD;  Location: [place] [facility];  Service: Cardiovascular;  Laterality: N/A;  . CARDIAC CATHETERIZATION N/A 08/16/2015   Procedure: Coronary Stent Intervention;  Surgeon: Yolonda Kida, MD;  Location: [place] [facility];  Service: Cardiovascular;  Laterality: N/A;  . CERVIX SURGERY    . PROSTATECTOMY    . SPINE SURGERY    . TONSILLECTOMY    . WRIST SURGERY      FAMILY HISTORY :   Family History  Problem Relation Age of Onset  . Heart attack Mother   . Hypertension Mother   . Heart attack Father     SOCIAL HISTORY:   Social History  Substance Use Topics  . Smoking status: Current Every Day Smoker    Packs/day: 0.25    Years: 40.00    Types: Cigarettes  .  Smokeless tobacco: Never Used     Comment: Down to 3 cigarettes a day   . Alcohol use No    ALLERGIES:  has No Known Allergies.  MEDICATIONS:  Current Outpatient Prescriptions  Medication Sig Dispense Refill  . abiraterone Acetate (ZYTIGA) 250 MG tablet Take 4 tablets (1,000 mg total) by mouth daily. Take on an empty stomach 1 hour before or 2 hours after a meal 120 tablet 4  . acetaminophen (TYLENOL) 500 MG tablet Take 1,500 mg by mouth every 8 (eight) hours as needed.     Marland Kitchen aspirin 81 MG tablet Take 81 mg by mouth daily.    Marland Kitchen atorvastatin (LIPITOR) 80 MG tablet Take 1 tablet (80 mg total) by  mouth daily at 6 PM. 90 tablet 3  . clopidogrel (PLAVIX) 75 MG tablet Take 1 tablet (75 mg total) by mouth daily. 90 tablet 3  . fentaNYL (DURAGESIC - DOSED MCG/HR) 100 MCG/HR Place 1 patch (100 mcg total) onto the skin every 3 (three) days. 10 patch 0  . nitroGLYCERIN (NITROSTAT) 0.4 MG SL tablet Place under the tongue.    Marland Kitchen Oxycodone HCl 10 MG TABS One pill every 8 hours as needed for pain 90 tablet 0  . polyethylene glycol (MIRALAX / GLYCOLAX) packet Take 17 g by mouth daily as needed for moderate constipation.    . predniSONE (DELTASONE) 5 MG tablet Take 5 mg by mouth 2 (two) times daily with a meal.    . Wheat Dextrin (BENEFIBER) POWD Stir 2 tsp. TID into 4-8 oz of any non-carbonated beverage or soft food (hot or cold) 500 g PRN   No current facility-administered medications for this visit.     PHYSICAL EXAMINATION: ECOG PERFORMANCE STATUS: 1 - Symptomatic but completely ambulatory  BP (!) 190/91 (BP Location: Right Arm, Patient Position: Sitting)   Pulse 72   Temp 98.4 F (36.9 C) (Tympanic)   Wt 187 lb 7.3 oz (85 kg)   BMI 26.90 kg/m   Filed Weights   07/11/16 1131  Weight: 187 lb 7.3 oz (85 kg)   GENERAL: Well-nourished well-developed; Alert, no distress and comfortable. He is Accompanied by his wife. Walks with a cane. EYES: no pallor or icterus OROPHARYNX: no thrush or ulceration; Upper dentures. NECK: supple, no masses felt LYMPH: no palpable lymphadenopathy in the cervical, axillary or inguinal regions LUNGS: clear to auscultation and No wheeze or crackles HEART/CVS: regular rate & rhythm and no murmurs; No lower extremity edema ABDOMEN:abdomen soft, non-tender and normal bowel sounds; No hepatomegaly. Musculoskeletal:no cyanosis of digits and no clubbing; No significant tenderness noted. PSYCH: alert & oriented x 3 with fluent speech NEURO: no focal motor/sensory deficits SKIN: no rashes or significant lesions   LABORATORY DATA:  I have reviewed the data  as listed    Component Value Date/Time   NA 140 07/06/2016 1520   NA 140 07/13/2013 1542   K 3.9 07/06/2016 1520   K 4.3 07/13/2013 1542   CL 105 07/06/2016 1520   CL 107 07/13/2013 1542   CO2 26 07/06/2016 1520   CO2 26 07/13/2013 1542   GLUCOSE 117 (H) 07/06/2016 1520   GLUCOSE 106 (H) 07/13/2013 1542   BUN 13 07/06/2016 1520   BUN 11 07/13/2013 1542   CREATININE 0.79 07/06/2016 1520   CREATININE 1.01 07/13/2013 1542   CALCIUM 9.5 07/06/2016 1520   CALCIUM 9.9 07/13/2013 1542   PROT 6.9 07/06/2016 1520   PROT 8.1 07/13/2013 1542   ALBUMIN 4.4 07/06/2016 1520  ALBUMIN 4.3 07/13/2013 1542   AST 19 07/06/2016 1520   AST 26 07/13/2013 1542   ALT 14 (L) 07/06/2016 1520   ALT 32 07/13/2013 1542   ALKPHOS 48 07/06/2016 1520   ALKPHOS 74 07/13/2013 1542   BILITOT 0.6 07/06/2016 1520   BILITOT 0.4 07/13/2013 1542   GFRNONAA >60 07/06/2016 1520   GFRNONAA >60 07/13/2013 1542   GFRAA >60 07/06/2016 1520   GFRAA >60 07/13/2013 1542    No results found for: SPEP, UPEP  Lab Results  Component Value Date   WBC 5.2 07/06/2016   NEUTROABS 4.0 07/06/2016   HGB 13.2 07/06/2016   HCT 39.0 (L) 07/06/2016   MCV 94.6 07/06/2016   PLT 164 07/06/2016      Chemistry      Component Value Date/Time   NA 140 07/06/2016 1520   NA 140 07/13/2013 1542   K 3.9 07/06/2016 1520   K 4.3 07/13/2013 1542   CL 105 07/06/2016 1520   CL 107 07/13/2013 1542   CO2 26 07/06/2016 1520   CO2 26 07/13/2013 1542   BUN 13 07/06/2016 1520   BUN 11 07/13/2013 1542   CREATININE 0.79 07/06/2016 1520   CREATININE 1.01 07/13/2013 1542      Component Value Date/Time   CALCIUM 9.5 07/06/2016 1520   CALCIUM 9.9 07/13/2013 1542   ALKPHOS 48 07/06/2016 1520   ALKPHOS 74 07/13/2013 1542   AST 19 07/06/2016 1520   AST 26 07/13/2013 1542   ALT 14 (L) 07/06/2016 1520   ALT 32 07/13/2013 1542   BILITOT 0.6 07/06/2016 1520   BILITOT 0.4 07/13/2013 1542     Results for Littman, Olsen L (MRN [id number]) as  of 07/11/2016 11:43  Ref. Range 07/15/2012 13:47 08/15/2012 10:21 12/05/2012 10:07 02/08/2015 09:52 04/27/2015 11:49 06/01/2015 11:35 07/14/2015 13:25 08/10/2015 14:03 10/05/2015 13:44 11/18/2015 13:35 01/04/2016 13:25 02/03/2016 13:51 03/06/2016 14:53 04/14/2016 10:40 05/26/2016 13:49 07/06/2016 15:20  PSA Latest Ref Range: 0.00 - 4.00 ng/mL 14.2 (H) 14.6 (H) 0.2 1,021.00 (H) 33.39 (H) 43.62 (H) 14.08 (H) 15.26 (H) 1.40 3.03 6.79 (H) 10.47 (H) 4.97 (H) 14.99 (H) 3.38   Prostatic Specific Antigen Latest Ref Range: 0.00 - 4.00 ng/mL                3.33     ASSESSMENT & PLAN:    Prostate cancer metastatic to bone ([place]) Castrate resistant prostate cancer-  Patient currently on Lupron androgen deprivation therapy; on Zytiga- since Aug 2017.  Jan 2018- bone scan- no significant active disease. May 2018- PSA 3.3/improving [currently on 2 pills- sec to intol/lightheadness; nausea].   # X-geva/ Lupron every 3 months [last may 29nd 2018]- on ca+vit D. No AEs noted. Ca- wnl.  Due for shots again in aug 2018.   # Back pain- ? Chronic vs bil rib pain [chronic] secondary malignancy status post radiation; improved; continue current regimen- fentanyl 100 g every 72 hours; continue oxycodone 10 mg every 8-12 hours. I would not recommend increasing patient's pain regimen at this time. Declines pain management. NO role for radiation.   # recent MI [April 2018; [place]]- s/p stenting; on Plavix; stable.  # Follow-up in 6 weeks; labs few days prior; X-geva/Lupron.      [name], MD 07/11/2016 3:47 PM

## 2016-07-11 NOTE — Assessment & Plan Note (Addendum)
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  Jan 2018- bone scan- no significant active disease. May 2018- PSA 3.3/improving [currently on 2 pills- sec to intol/lightheadness; nausea].   # X-geva/ Lupron every 3 months [last may 29nd 2018]- on ca+vit D. No AEs noted. Ca- wnl.  Due for shots again in aug 2018.   # Back pain- ? Chronic vs bil rib pain [chronic] secondary malignancy status post radiation; improved; continue current regimen- fentanyl 100 g every 72 hours; continue oxycodone 10 mg every 8-12 hours. I would not recommend increasing patient's pain regimen at this time. Declines pain management. NO role for radiation.   # recent MI [April 2018; [place]]- s/p stenting; on Plavix; stable.  # Follow-up in 6 weeks; labs few days prior; X-geva/Lupron.

## 2016-07-11 NOTE — Progress Notes (Signed)
Patient here today for follow up.  Patient c/o not sleeping at night, and unable to get control of his pain, new pain on the left sided upper quadrant area with movement.

## 2016-07-14 ENCOUNTER — Telehealth: Payer: Self-pay

## 2016-07-14 NOTE — Telephone Encounter (Signed)
LMOM

## 2016-07-17 DIAGNOSIS — I213 ST elevation (STEMI) myocardial infarction of unspecified site: Secondary | ICD-10-CM

## 2016-07-17 DIAGNOSIS — Z955 Presence of coronary angioplasty implant and graft: Secondary | ICD-10-CM
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
## 2016-07-18 ENCOUNTER — Encounter: Payer: Self-pay | Admitting: Family Medicine

## 2016-07-18 ENCOUNTER — Ambulatory Visit (INDEPENDENT_AMBULATORY_CARE_PROVIDER_SITE_OTHER): Payer: 59 | Admitting: Family Medicine

## 2016-07-18 VITALS — BP 142/82 | HR 67 | Temp 98.8°F | Resp 16 | Ht 70.0 in | Wt 181.6 lb

## 2016-07-18 DIAGNOSIS — R7303 Prediabetes: Secondary | ICD-10-CM | POA: Insufficient documentation

## 2016-07-18 DIAGNOSIS — I10 Essential (primary) hypertension: Secondary | ICD-10-CM
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
## 2016-07-18 NOTE — Patient Instructions (Addendum)
Thank you for coming to the clinic today.  1. Blood pressure is elevated today but still improved from before.  Keep checking BP as you are.  Remain off of all BP meds for now.  Please contact Dr [name] (Cardiology) anytime in next few days to weeks to see about follow-up to discuss BP again, see if you need to resume LOW dose metoprolol or other BP med  Ordered Non fasting, Hemoglobin A1c lab test - bring to lab on 08/17/16, with your other blood work  Please schedule a Follow-up Appointment to: Return in about 3 months (around 10/18/2016) for blood pressure.  If you have any other questions or concerns, please feel free to call the clinic or send a message through [facility]. You may also schedule an earlier appointment if necessary.  Additionally, you may be receiving a survey about your experience at our clinic within a few days to 1 week by e-mail or mail. We value your feedback.  Nobie Putnam, DO [place]

## 2016-07-19 ENCOUNTER — Encounter: Payer: Self-pay | Admitting: *Deleted

## 2016-07-19 DIAGNOSIS — I213 ST elevation (STEMI) myocardial infarction of unspecified site: Secondary | ICD-10-CM

## 2016-07-19 DIAGNOSIS — Z955 Presence of coronary angioplasty implant and graft: Secondary | ICD-10-CM
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
## 2016-07-19 NOTE — Assessment & Plan Note (Signed)
Previously well-controlled Pre-DM with A1c 6s, last 6.2 in 05/2015 Concern with chronic prednisone in setting of prostate CA  Plan:  1. Not on any therapy currently  2. Encourage improved lifestyle - low carb, low sugar diet, reduce portion size, continue improving regular exercise 3. Follow-up 3 months - ordered active A1c order to be drawn through [facility] at [place] in August 2018

## 2016-07-19 NOTE — Assessment & Plan Note (Addendum)
Mildly elevated initial BP, repeat manual check improved, SBP >140 - off medications. - Home BP readings fluctuating but either within range or moderately elevated, avg likely closer to 499U  Complication with CAD, prostate CA   Plan:  1. Again discussion on may hold anti-HTN therapy at this time, deferred to Cardiology previously, he was advised may be able to resume low dose ARB and or BB metoprolol, however has not started, due for follow-up with Cards Dr [name] soon anytime within next 1 month, working on Cardiac rehab. Again reviewed indication for BB and ARB post MI 2. Encourage improved lifestyle - low sodium diet, regular exercise 3. Continue monitor BP outside office, bring readings to next visit, if persistently >140/90 or new symptoms notify office sooner 4. Follow-up 3 months, sooner as needed if persistent elevated BP

## 2016-07-24 ENCOUNTER — Other Ambulatory Visit: Payer: Self-pay | Admitting: Internal Medicine

## 2016-07-24 DIAGNOSIS — G893 Neoplasm related pain (acute) (chronic): Secondary | ICD-10-CM

## 2016-07-24 DIAGNOSIS — C61 Malignant neoplasm of prostate: Secondary | ICD-10-CM

## 2016-07-24 DIAGNOSIS — C7951 Secondary malignant neoplasm of bone: Principal | ICD-10-CM

## 2016-07-24 MED ORDER — OXYCODONE HCL 10 MG PO TABS: ORAL_TABLET | ORAL | 0 refills | Status: DC

## 2016-07-24 MED ORDER — FENTANYL 100 MCG/HR TD PT72: 100.0000 ug | MEDICATED_PATCH | TRANSDERMAL | 0 refills | Status: DC

## 2016-07-24 NOTE — Telephone Encounter (Signed)
Okay to refill per Dr Rogue Bussing

## 2016-08-01 ENCOUNTER — Telehealth: Payer: Self-pay | Admitting: Cardiovascular Disease

## 2016-08-01 NOTE — Telephone Encounter (Signed)
Per Wife patient having chest pain and she would like him to be seen sooner.     Patient scheduled for 8/2 with Ignacia Bayley

## 2016-08-02 ENCOUNTER — Emergency Department: Payer: 59

## 2016-08-02 ENCOUNTER — Observation Stay
Admission: EM | Admit: 2016-08-02 | Discharge: 2016-08-03 | Disposition: A | Discharge status: Home / Self Care | Payer: 59 | Attending: Internal Medicine | Admitting: Internal Medicine

## 2016-08-02 ENCOUNTER — Encounter: Payer: 59 | Attending: Cardiovascular Disease

## 2016-08-02 ENCOUNTER — Telehealth: Payer: Self-pay | Admitting: Nurse Practitioner

## 2016-08-02 ENCOUNTER — Encounter: Payer: Self-pay | Admitting: Emergency Medicine

## 2016-08-02 DIAGNOSIS — Z9079 Acquired absence of other genital organ(s): Secondary | ICD-10-CM | POA: Insufficient documentation

## 2016-08-02 DIAGNOSIS — M549 Dorsalgia, unspecified: Secondary | ICD-10-CM | POA: Diagnosis not present

## 2016-08-02 DIAGNOSIS — R5382 Chronic fatigue, unspecified: Secondary | ICD-10-CM | POA: Diagnosis not present

## 2016-08-02 DIAGNOSIS — C61 Malignant neoplasm of prostate: Secondary | ICD-10-CM | POA: Insufficient documentation

## 2016-08-02 DIAGNOSIS — Z79899 Other long term (current) drug therapy: Secondary | ICD-10-CM | POA: Diagnosis not present

## 2016-08-02 DIAGNOSIS — R7303 Prediabetes: Secondary | ICD-10-CM | POA: Diagnosis not present

## 2016-08-02 DIAGNOSIS — C7951 Secondary malignant neoplasm of bone: Secondary | ICD-10-CM | POA: Diagnosis not present

## 2016-08-02 DIAGNOSIS — Z8546 Personal history of malignant neoplasm of prostate: Secondary | ICD-10-CM | POA: Insufficient documentation

## 2016-08-02 DIAGNOSIS — E785 Hyperlipidemia, unspecified: Secondary | ICD-10-CM | POA: Insufficient documentation

## 2016-08-02 DIAGNOSIS — R0789 Other chest pain: Principal | ICD-10-CM | POA: Insufficient documentation

## 2016-08-02 DIAGNOSIS — Z7982 Long term (current) use of aspirin: Secondary | ICD-10-CM | POA: Diagnosis not present

## 2016-08-02 DIAGNOSIS — Z87442 Personal history of urinary calculi: Secondary | ICD-10-CM | POA: Insufficient documentation

## 2016-08-02 DIAGNOSIS — G629 Polyneuropathy, unspecified: Secondary | ICD-10-CM | POA: Insufficient documentation

## 2016-08-02 DIAGNOSIS — I739 Peripheral vascular disease, unspecified: Secondary | ICD-10-CM | POA: Insufficient documentation

## 2016-08-02 DIAGNOSIS — Z8249 Family history of ischemic heart disease and other diseases of the circulatory system: Secondary | ICD-10-CM | POA: Insufficient documentation

## 2016-08-02 DIAGNOSIS — I25118 Atherosclerotic heart disease of native coronary artery with other forms of angina pectoris: Secondary | ICD-10-CM | POA: Insufficient documentation

## 2016-08-02 DIAGNOSIS — J441 Chronic obstructive pulmonary disease with (acute) exacerbation: Secondary | ICD-10-CM | POA: Diagnosis not present

## 2016-08-02 DIAGNOSIS — I10 Essential (primary) hypertension: Secondary | ICD-10-CM | POA: Diagnosis not present

## 2016-08-02 DIAGNOSIS — Z79891 Long term (current) use of opiate analgesic: Secondary | ICD-10-CM | POA: Diagnosis not present

## 2016-08-02 DIAGNOSIS — R079 Chest pain, unspecified: Secondary | ICD-10-CM | POA: Diagnosis present

## 2016-08-02 DIAGNOSIS — Z7952 Long term (current) use of systemic steroids: Secondary | ICD-10-CM | POA: Diagnosis not present

## 2016-08-02 DIAGNOSIS — F329 Major depressive disorder, single episode, unspecified: Secondary | ICD-10-CM | POA: Diagnosis not present

## 2016-08-02 DIAGNOSIS — Z955 Presence of coronary angioplasty implant and graft: Secondary | ICD-10-CM | POA: Diagnosis not present

## 2016-08-02 DIAGNOSIS — Z9889 Other specified postprocedural states: Secondary | ICD-10-CM | POA: Insufficient documentation

## 2016-08-02 DIAGNOSIS — G893 Neoplasm related pain (acute) (chronic): Secondary | ICD-10-CM | POA: Insufficient documentation

## 2016-08-02 DIAGNOSIS — I200 Unstable angina: Secondary | ICD-10-CM

## 2016-08-02 DIAGNOSIS — I25110 Atherosclerotic heart disease of native coronary artery with unstable angina pectoris: Secondary | ICD-10-CM | POA: Diagnosis not present

## 2016-08-02 DIAGNOSIS — J449 Chronic obstructive pulmonary disease, unspecified: Secondary | ICD-10-CM | POA: Diagnosis not present

## 2016-08-02 DIAGNOSIS — F17210 Nicotine dependence, cigarettes, uncomplicated: Secondary | ICD-10-CM | POA: Insufficient documentation

## 2016-08-02 DIAGNOSIS — Z79811 Long term (current) use of aromatase inhibitors: Secondary | ICD-10-CM | POA: Insufficient documentation

## 2016-08-02 DIAGNOSIS — F17200 Nicotine dependence, unspecified, uncomplicated: Secondary | ICD-10-CM | POA: Insufficient documentation

## 2016-08-02 DIAGNOSIS — Z7902 Long term (current) use of antithrombotics/antiplatelets: Secondary | ICD-10-CM | POA: Diagnosis not present

## 2016-08-02 DIAGNOSIS — I2102 ST elevation (STEMI) myocardial infarction involving left anterior descending coronary artery: Secondary | ICD-10-CM | POA: Insufficient documentation

## 2016-08-02 DIAGNOSIS — E7800 Pure hypercholesterolemia, unspecified: Secondary | ICD-10-CM | POA: Insufficient documentation

## 2016-08-02 HISTORY — DX: Personal history of other medical treatment: Z92.89

## 2016-08-02 HISTORY — DX: Atherosclerotic heart disease of native coronary artery without angina pectoris: I25.10

## 2016-08-02 HISTORY — DX: Tobacco use: Z72.0

## 2016-08-02 HISTORY — DX: Hyperlipidemia, unspecified: E78.5

## 2016-08-02 LAB — BASIC METABOLIC PANEL
ANION GAP: 7 (ref 5–15)
BUN: 17 mg/dL (ref 6–20)
CO2: 26 mmol/L (ref 22–32)
Calcium: 9 mg/dL (ref 8.9–10.3)
Chloride: 107 mmol/L (ref 101–111)
Creatinine, Ser: 0.79 mg/dL (ref 0.61–1.24)
GFR calc Af Amer: >60 mL/min (ref >60)
GFR calc non Af Amer: >60 mL/min (ref >60)
GLUCOSE: 123 mg/dL — AB (ref 65–99)
POTASSIUM: 3.5 mmol/L (ref 3.5–5.1)
Sodium: 140 mmol/L (ref 135–145)

## 2016-08-02 LAB — CBC
HCT: 37.9 % — ABNORMAL LOW (ref 40.0–52.0)
HEMOGLOBIN: 12.9 g/dL — AB (ref 13.0–18.0)
MCH: 31.9 pg (ref 26.0–34.0)
MCHC: 34.1 g/dL (ref 32.0–36.0)
MCV: 93.6 fL (ref 80.0–100.0)
Platelets: 134 10*3/uL — ABNORMAL LOW (ref 150–440)
RBC: 4.05 MIL/uL — ABNORMAL LOW (ref 4.40–5.90)
RDW: 15.6 % — ABNORMAL HIGH (ref 11.5–14.5)
WBC: 4.5 10*3/uL (ref 3.8–10.6)

## 2016-08-02 LAB — TROPONIN I: Troponin I: <0.03 ng/mL (ref <0.03)

## 2016-08-02 LAB — APTT: aPTT: 35 seconds (ref 24–36)

## 2016-08-02 LAB — PROTIME-INR
INR: 0.96
Prothrombin Time: 12.8 seconds (ref 11.4–15.2)

## 2016-08-02 MED ORDER — ACETAMINOPHEN 325 MG PO TABS
650.0000 mg | ORAL_TABLET | Freq: Four times a day (QID) | ORAL | Status: DC | PRN
  Administered 2016-08-02: 650 mg via ORAL
  Filled 2016-08-02 (×2): qty 2

## 2016-08-02 MED ORDER — OXYCODONE HCL 5 MG PO TABS
10.0000 mg | ORAL_TABLET | Freq: Three times a day (TID) | ORAL | Status: DC | PRN
  Administered 2016-08-03: 10 mg via ORAL
  Filled 2016-08-02: qty 2

## 2016-08-02 MED ORDER — LISINOPRIL 5 MG PO TABS
2.5000 mg | ORAL_TABLET | Freq: Every day | ORAL | Status: DC
  Administered 2016-08-03: 2.5 mg via ORAL
  Filled 2016-08-02 (×2): qty 1

## 2016-08-02 MED ORDER — ONDANSETRON HCL 4 MG/2ML IJ SOLN
4.0000 mg | Freq: Four times a day (QID) | INTRAMUSCULAR | Status: DC | PRN
  Administered 2016-08-03: 4 mg via INTRAVENOUS
  Filled 2016-08-02: qty 2

## 2016-08-02 MED ORDER — FENTANYL 50 MCG/HR TD PT72: 100.0000 ug | MEDICATED_PATCH | TRANSDERMAL | Status: DC

## 2016-08-02 MED ORDER — ASPIRIN EC 81 MG PO TBEC
81.0000 mg | DELAYED_RELEASE_TABLET | Freq: Every day | ORAL | Status: DC
  Administered 2016-08-03: 81 mg via ORAL
  Filled 2016-08-02: qty 1

## 2016-08-02 MED ORDER — HEPARIN BOLUS VIA INFUSION
4000.0000 [IU] | Freq: Once | INTRAVENOUS | Status: AC
  Administered 2016-08-02: 4000 [IU] via INTRAVENOUS
  Filled 2016-08-02: qty 4000

## 2016-08-02 MED ORDER — PREDNISONE 10 MG PO TABS
5.0000 mg | ORAL_TABLET | Freq: Two times a day (BID) | ORAL | Status: DC
  Filled 2016-08-02: qty 1

## 2016-08-02 MED ORDER — POLYETHYLENE GLYCOL 3350 17 G PO PACK: 17.0000 g | PACK | Freq: Every day | ORAL | Status: DC | PRN

## 2016-08-02 MED ORDER — NITROGLYCERIN 2 % TD OINT
1.0000 [in_us] | TOPICAL_OINTMENT | Freq: Once | TRANSDERMAL | Status: AC
  Administered 2016-08-02: 1 [in_us] via TOPICAL
  Filled 2016-08-02: qty 1

## 2016-08-02 MED ORDER — CLOPIDOGREL BISULFATE 75 MG PO TABS
75.0000 mg | ORAL_TABLET | Freq: Every day | ORAL | Status: DC
  Administered 2016-08-03: 75 mg via ORAL
  Filled 2016-08-02: qty 1

## 2016-08-02 MED ORDER — NITROGLYCERIN 0.4 MG SL SUBL
0.4000 mg | SUBLINGUAL_TABLET | SUBLINGUAL | Status: DC | PRN
  Administered 2016-08-03: 0.4 mg via SUBLINGUAL
  Filled 2016-08-02: qty 1

## 2016-08-02 MED ORDER — HEPARIN (PORCINE) IN NACL 100-0.45 UNIT/ML-% IJ SOLN
1000.0000 [IU]/h | INTRAMUSCULAR | Status: DC
  Administered 2016-08-02: 1000 [IU]/h via INTRAVENOUS
  Filled 2016-08-02: qty 250

## 2016-08-02 MED ORDER — ACETAMINOPHEN 650 MG RE SUPP: 650.0000 mg | Freq: Four times a day (QID) | RECTAL | Status: DC | PRN

## 2016-08-02 MED ORDER — METOPROLOL TARTRATE 25 MG PO TABS
25.0000 mg | ORAL_TABLET | Freq: Two times a day (BID) | ORAL | Status: DC
  Administered 2016-08-02: 25 mg via ORAL
  Filled 2016-08-02: qty 1

## 2016-08-02 MED ORDER — ASPIRIN 81 MG PO CHEW
324.0000 mg | CHEWABLE_TABLET | Freq: Once | ORAL | Status: AC
  Administered 2016-08-02: 324 mg via ORAL
  Filled 2016-08-02: qty 4

## 2016-08-02 MED ORDER — FENTANYL CITRATE (PF) 100 MCG/2ML IJ SOLN
50.0000 ug | Freq: Once | INTRAMUSCULAR | Status: AC
  Administered 2016-08-02: 50 ug via INTRAVENOUS
  Filled 2016-08-02: qty 2

## 2016-08-02 MED ORDER — ABIRATERONE ACETATE 250 MG PO TABS: 500.0000 mg | ORAL_TABLET | Freq: Every day | ORAL | Status: DC

## 2016-08-02 MED ORDER — ATORVASTATIN CALCIUM 20 MG PO TABS
80.0000 mg | ORAL_TABLET | Freq: Every day | ORAL | Status: DC
  Administered 2016-08-02: 80 mg via ORAL
  Filled 2016-08-02: qty 4

## 2016-08-02 MED ORDER — ONDANSETRON HCL 4 MG PO TABS: 4.0000 mg | ORAL_TABLET | Freq: Four times a day (QID) | ORAL | Status: DC | PRN

## 2016-08-02 NOTE — ED Notes (Signed)
Heparin verified by Larene Beach RN

## 2016-08-02 NOTE — Progress Notes (Signed)
ANTICOAGULATION CONSULT NOTE - Initial Consult  Pharmacy Consult for heparin Indication: chest pain/ACS  No Known Allergies  Patient Measurements: Height: 5\' 10"  (177.8 cm) Weight: 181 lb (82.1 kg) IBW/kg (Calculated) : 73 Heparin Dosing Weight: 82.1 kg  Vital Signs: Temp: 98.4 F (36.9 C) (08/01 1642) Temp Source: Oral (08/01 1642) BP: 192/99 (08/01 1747) Pulse Rate: 76 (08/01 1747)  Labs:  Recent Labs  08/02/16 1645 08/02/16 1717  HGB 12.9*  --   HCT 37.9*  --   PLT 134*  --   APTT  --  35  LABPROT  --  12.8  INR  --  0.96  CREATININE 0.79  --   TROPONINI <0.03  --     Estimated Creatinine Clearance: 95.1 mL/min (by C-G formula based on SCr of 0.79 mg/dL).   Medical History: Past Medical History:  Diagnosis Date  . ASCVD (arteriosclerotic cardiovascular disease)   . Back pain   . Back pain 10/09/2012  . Bone cancer ([place])   . Cancer associated pain   . Depression   . History of kidney stones   . Hypertension   . Joint pain   . Prostate cancer ([place])   . Right arm pain 01/10/2016  . Right foot pain 01/10/2016  . Right leg pain 01/10/2016  . SOB (shortness of breath) 10/09/2012  . Unable to ambulate 10/09/2012    Medications:  Infusions:  . heparin      Assessment: 66 yom cc CP with significant ACS history, multiple stents most recently in April. Initial troponin negative, ED ECG with accelerated junctional rhythym can't r/o anterior infarct. Pharmacy consulted to dose heparin for ACS. No PTA OAC noted.    Goal of Therapy:  Heparin level 0.3-0.7 units/ml Monitor platelets by anticoagulation protocol: Yes   Plan:  Give 4000 units bolus x 1 Start heparin infusion at 1000 units/hr Check anti-Xa level in 6 hours and daily while on heparin Continue to monitor H&H and platelets  Laural Benes, Pharm.D., BCPS Clinical Pharmacist 08/02/2016,6:05 PM

## 2016-08-02 NOTE — ED Triage Notes (Addendum)
Pt reports generalized chest pain radiating to jaw that began today. Pt reports SOB, dizziness, lightheadedness, nausea and back pain as associated symptoms. Pt reports history of MI with stent placement. Pt took one SL NTG prior to arrival with some relief.

## 2016-08-02 NOTE — Telephone Encounter (Signed)
Pt c/o of Chest Pain: STAT if CP now or developed within 24 hours  1. Are you having CP right now? No, not since he has taken the Nitroglycerin   2. Are you experiencing any other symptoms (ex. SOB, nausea, vomiting, sweating)? Heart racing  3. How long have you been experiencing CP? Within the last 30 minutes  4. Is your CP continuous or coming and going? Conintual, about 5 minutes  5. Have you taken Nitroglycerin? yes ?

## 2016-08-02 NOTE — H&P (Signed)
[facility] [place] NAME: Robert Short    MR#:  [id number]  DATE OF BIRTH:  22-Feb-1950  DATE OF ADMISSION:  08/02/2016  PRIMARY CARE PHYSICIAN: Olin Hauser, DO   REQUESTING/REFERRING PHYSICIAN: Dr. Charlotte Crumb  CHIEF COMPLAINT:   Chief Complaint  Patient presents with  . Chest Pain    HISTORY OF PRESENT ILLNESS:  Robert Short  is a 66 y.o. male with a known history of Coronary artery disease status post stent placement, hypertension, hyperlipidemia, history of prostate cancer, COPD, ongoing tobacco abuse, peripheral vascular disease, depression who presents to the hospital complaining of chest pain. Patient says he's been having progressive chest pain ongoing now for the past 2 weeks. He gets intermittent episodes of chest pain located in the center of his chest nonradiating associated with some shortness of breath, diaphoresis and palpitations and some dizziness. His wife urged him to come to the ER given his significant cardiac history. In the emergency room patient EKG shows no acute ST or T-wave changes and his force of the cardiac markers are negative but given the frequency of his symptoms and his risk factors hospitalist services were contacted further treatment and evaluation.  PAST MEDICAL HISTORY:   Past Medical History:  Diagnosis Date  . ASCVD (arteriosclerotic cardiovascular disease)   . Back pain   . Back pain 10/09/2012  . Bone cancer ([place])   . Cancer associated pain   . Depression   . History of kidney stones   . Hypertension   . Joint pain   . Prostate cancer ([place])   . Right arm pain 01/10/2016  . Right foot pain 01/10/2016  . Right leg pain 01/10/2016  . SOB (shortness of breath) 10/09/2012  . Unable to ambulate 10/09/2012    PAST SURGICAL HISTORY:   Past Surgical History:  Procedure Laterality Date  . CARDIAC CATHETERIZATION     [facility]  . CARDIAC CATHETERIZATION N/A 08/16/2015   Procedure: Left Heart  Cath and Coronary Angiography;  Surgeon: Yolonda Kida, MD;  Location: [place] [facility];  Service: Cardiovascular;  Laterality: N/A;  . CARDIAC CATHETERIZATION N/A 08/16/2015   Procedure: Coronary Stent Intervention;  Surgeon: Yolonda Kida, MD;  Location: [place] [facility];  Service: Cardiovascular;  Laterality: N/A;  . CERVIX SURGERY    . PROSTATECTOMY    . SPINE SURGERY    . TONSILLECTOMY    . WRIST SURGERY      SOCIAL HISTORY:   Social History  Substance Use Topics  . Smoking status: Current Every Day Smoker    Packs/day: 1.00    Years: 40.00    Types: Cigarettes  . Smokeless tobacco: Current User     Comment: Down to 3 cigarettes a day   . Alcohol use No     Comment: occasionally    FAMILY HISTORY:   Family History  Problem Relation Age of Onset  . Heart attack Mother   . Hypertension Mother   . Heart attack Father     DRUG ALLERGIES:  No Known Allergies  REVIEW OF SYSTEMS:   Review of Systems  Constitutional: Negative for fever and weight loss.  HENT: Negative for congestion, nosebleeds and tinnitus.   Eyes: Negative for blurred vision, double vision and redness.  Respiratory: Positive for shortness of breath. Negative for cough and hemoptysis.   Cardiovascular: Positive for chest pain. Negative for orthopnea, leg swelling and PND.  Gastrointestinal: Negative for abdominal pain, diarrhea, melena,  nausea and vomiting.  Genitourinary: Negative for dysuria, hematuria and urgency.  Musculoskeletal: Negative for falls and joint pain.  Neurological: Negative for dizziness, tingling, sensory change, focal weakness, seizures, weakness and headaches.  Endo/Heme/Allergies: Negative for polydipsia. Does not bruise/bleed easily.  Psychiatric/Behavioral: Negative for depression and memory loss. The patient is not nervous/anxious.     MEDICATIONS AT HOME:   Prior to Admission medications   Medication Sig Start Date End Date Taking? Authorizing Provider   abiraterone Acetate (ZYTIGA) 250 MG tablet Take 4 tablets (1,000 mg total) by mouth daily. Take on an empty stomach 1 hour before or 2 hours after a meal Patient taking differently: Take 500 mg by mouth daily. Take on an empty stomach 1 hour before or 2 hours after a meal 04/18/16  Yes [name], MD  acetaminophen (TYLENOL) 500 MG tablet Take 1,000 mg by mouth every 8 (eight) hours as needed.  12/03/12  Yes [provider]  aspirin 81 MG tablet Take 81 mg by mouth daily.   Yes [provider]  atorvastatin (LIPITOR) 80 MG tablet Take 1 tablet (80 mg total) by mouth daily at 6 PM. 04/27/16  Yes Gollan, [name], MD  clopidogrel (PLAVIX) 75 MG tablet Take 1 tablet (75 mg total) by mouth daily. 04/27/16  Yes Gollan, [name], MD  fentaNYL (DURAGESIC - DOSED MCG/HR) 100 MCG/HR Place 1 patch (100 mcg total) onto the skin every 3 (three) days. 07/24/16  Yes [name], MD  nitroGLYCERIN (NITROSTAT) 0.4 MG SL tablet Place 0.4 mg under the tongue every 5 (five) minutes as needed.  04/07/16  Yes [provider]  Oxycodone HCl 10 MG TABS One pill every 8 hours as needed for pain 07/24/16  Yes Brahmanday, Elisha Headland, MD  polyethylene glycol (MIRALAX / GLYCOLAX) packet Take 17 g by mouth daily as needed for moderate constipation.   Yes [provider]  predniSONE (DELTASONE) 5 MG tablet Take 5 mg by mouth 2 (two) times daily with a meal.   Yes [provider]  Wheat Dextrin (BENEFIBER) POWD Stir 2 tsp. TID into 4-8 oz of any non-carbonated beverage or soft food (hot or cold) 12/07/14  Yes Milinda Pointer, MD      VITAL SIGNS:  Blood pressure (!) 192/99, pulse 76, temperature 98.4 F (36.9 C), temperature source Oral, resp. rate 18, height 5\' 10"  (1.778 m), weight 82.1 kg (181 lb), SpO2 99 %.  PHYSICAL EXAMINATION:  Physical Exam  GENERAL:  66 y.o.-year-old patient lying in the bed in no acute distress.  EYES: Pupils equal, round, reactive to  light and accommodation. No scleral icterus. Extraocular muscles intact.  HEENT: Head atraumatic, normocephalic. Oropharynx and nasopharynx clear. No oropharyngeal erythema, moist oral mucosa  NECK:  Supple, no jugular venous distention. No thyroid enlargement, no tenderness.  LUNGS: Good air entry bilaterally, no wheezing, rales, rhonchi. No use of accessory muscles of respiration.  CARDIOVASCULAR: S1, S2 RRR. No murmurs, rubs, gallops, clicks.  ABDOMEN: Soft, nontender, nondistended. Bowel sounds present. No organomegaly or mass.  EXTREMITIES: No pedal edema, cyanosis, or clubbing. + 2 pedal & radial pulses b/l.   NEUROLOGIC: Cranial nerves II through XII are intact. No focal Motor or sensory deficits appreciated b/l PSYCHIATRIC: The patient is alert and oriented x 3. Good affect.  SKIN: No obvious rash, lesion, or ulcer.   LABORATORY PANEL:   CBC  Recent Labs Lab 08/02/16 1645  WBC 4.5  HGB 12.9*  HCT 37.9*  PLT 134*   ------------------------------------------------------------------------------------------------------------------  Chemistries   Recent Labs Lab 08/02/16 1645  NA 140  K 3.5  CL 107  CO2 26  GLUCOSE 123*  BUN 17  CREATININE 0.79  CALCIUM 9.0   ------------------------------------------------------------------------------------------------------------------  Cardiac Enzymes  Recent Labs Lab 08/02/16 1645  TROPONINI <0.03   ------------------------------------------------------------------------------------------------------------------  RADIOLOGY:  Dg Chest 2 View  Result Date: 08/02/2016 CLINICAL DATA:  Generalized chest pain EXAM: CHEST  2 VIEW COMPARISON:  08/16/2015.  Chest CT 02/11/2015. FINDINGS: Heart is normal size. Small nodular densities project over both lower lobes. This may reflect nipple shadows. No effusions. Diffuse sclerotic lesions throughout the bones as seen on prior CT compatible with bony metastatic disease. IMPRESSION: Small  nodular densities project over both lower lobes, possibly nipple shadows. Recommend repeating with nipple markers. Innumerable sclerotic lesions throughout the bones compatible with sclerotic bony metastases as seen on prior CT. Electronically Signed   By: Rolm Baptise M.D.   On: 08/02/2016 17:40     IMPRESSION AND PLAN:   Anant Agard  is a 66 y.o. male with a known history of Coronary artery disease status post stent placement, hypertension, hyperlipidemia, history of prostate cancer, COPD, ongoing tobacco abuse, peripheral vascular disease, depression who presents to the hospital complaining of chest pain.   1. Chest pain-patient does have significant risk factors given his previous history of coronary disease and stent placement, ongoing tobacco abuse. -His EKG shows no acute ST or T-wave changes. His first set of cardiac markers are negative. -I will observe him on telemetry, cycle his cardiac markers, get a cardiology consult. Check a two-dimensional cardiogram. -Continue aspirin, Plavix, atorvastatin, heparin drip. -Start patient on low-dose metoprolol, Seroquel.  2. Chronic pain-continue fentanyl patch, oxycodone as needed.  3. Hyperlipidemia-continue atorvastatin.  4. COPD with ongoing tobacco abuse-no acute exacerbation. -Continue maintenance prednisone.    All the records are reviewed and case discussed with ED provider. Management plans discussed with the patient, family and they are in agreement.  CODE STATUS: Full  TOTAL TIME TAKING CARE OF THIS PATIENT: 45 minutes.    Henreitta Leber M.D on 08/02/2016 at 7:27 PM  Between 7am to 6pm - Pager - [phone]  After 6pm go to [email or web address] - password [email or web address] [facility]  Office  [phone]  CC: Primary care physician; Olin Hauser, DO

## 2016-08-02 NOTE — Telephone Encounter (Signed)
S/w patient's wife first, ok per DPR. She was not with patient at the time. Patient had taken 2 nitro at this point and pain went away. Advised I would need to call and speak directly with the patient.  S/w patient. He says he has not had any chest pain since taking 2 Nitro about an hour ago. At the time of the chest pain, he also experienced nausea, sweating, heaviness on his chest, jaw pain and heart racing. This is the 3rd occurrence of symptoms since last Thursday or Friday. Patient is not having chest pain at this time. Patient has appt tomorrow, 08/03/16 at 2:30 pm with Ignacia Bayley, NP.  Discussed with Dr End, DOD, who advised to have patient take a Nitro and call 911 if chest pain returns prior to appt tomorrow. Patient advised of recommendations and verbalized understanding to take a Nitro and call 911 to go to the ER if chest pain and symptoms recur. He was very Patent attorney.

## 2016-08-02 NOTE — ED Provider Notes (Addendum)
[facility] Emergency Department Provider Note  ____________________________________________   I have reviewed the triage vital signs and the nursing notes.   HISTORY  Chief Complaint Chest Pain    HPI Robert Short is a 66 y.o. male with a history of significant ACS. Patient has had multiple stents most recently in April. He has unfortunately continue to smoke. Patient also has metastatic prostate cancer. With medicine He has pain "all the time". From his bone cancer. Patient states that he has been having anginal-like events for the last several weeks but did not want to tell me 1. Mostly when he is "walking around". This chest pain that radiates to the jaws. A pressure-like sensation. He had that this afternoon. Around 1:30. The pain is gone at this time after he took a home nitroglycerin. He has residual discomfort but he states it does not seem to be cardiac in origin at this time. He has had no pleuritic chest pain. It's a pressure-like sensation. The patient states that he has had no nausea but he did have diaphoresis and he was mildly short of breath. No history of pneumonia recently or cough.  Patient is quite anxious.   Past Medical History:  Diagnosis Date  . ASCVD (arteriosclerotic cardiovascular disease)   . Back pain   . Back pain 10/09/2012  . Bone cancer ([place])   . Cancer associated pain   . Depression   . History of kidney stones   . Hypertension   . Joint pain   . Prostate cancer ([place])   . Right arm pain 01/10/2016  . Right foot pain 01/10/2016  . Right leg pain 01/10/2016  . SOB (shortness of breath) 10/09/2012  . Unable to ambulate 10/09/2012    Patient Active Problem List   Diagnosis Date Noted  . Pre-diabetes 07/18/2016  . Stable angina ([place]) 04/21/2016  . Nausea 01/17/2016  . STEMI (ST elevation myocardial infarction) ([place]) 08/16/2015  . Acute ST elevation myocardial infarction (STEMI) involving right coronary artery without  development of Q waves ([place]) 08/16/2015  . Neoplasm related pain 06/22/2015  . Cancer associated pain 02/04/2015  . Sebaceous cyst 01/18/2015  . Chronic neck pain 12/08/2014  . Cervical spondylosis 12/08/2014  . Chronic low back pain 12/08/2014  . Lumbar spondylosis 12/08/2014  . Chronic shoulder pain 12/08/2014  . Prostate cancer metastatic to bone ([place]) 12/08/2014  . Cancer-related pain 12/08/2014  . Neurogenic pain 12/08/2014  . Musculoskeletal pain 12/08/2014  . Chronic pain 12/07/2014  . Long term current use of opiate analgesic 12/07/2014  . Long term prescription opiate use 12/07/2014  . Opiate use 12/07/2014  . Encounter for therapeutic drug level monitoring 12/07/2014  . Encounter for chronic pain management 12/07/2014  . Therapeutic opioid-induced constipation (OIC) 12/07/2014  . Fatigue 08/27/2014  . CFIDS (chronic fatigue and immune dysfunction syndrome) ([place]) 06/04/2014  . Cervical spinal stenosis 06/04/2014  . Clinical depression 06/04/2014  . Essential (primary) hypertension 06/04/2014  . HLD (hyperlipidemia) 06/04/2014  . Amnesia 06/04/2014  . Chronic cervical pain 06/04/2014  . Neuropathy ([place]) 06/04/2014  . Loss of feeling or sensation 06/04/2014  . Blood glucose elevated 06/04/2014  . Pain in shoulder 06/04/2014  . Compulsive tobacco user syndrome 06/04/2014  . CAD (coronary artery disease) 10/09/2012  . Smoker 10/09/2012  . ED (erectile dysfunction) of organic origin 08/08/2012  . Acid reflux 08/08/2012  . Lower urinary tract symptoms 08/08/2012  . Restless leg 08/01/2010  . Back ache 08/01/2010    Past  Surgical History:  Procedure Laterality Date  . CARDIAC CATHETERIZATION     [facility]  . CARDIAC CATHETERIZATION N/A 08/16/2015   Procedure: Left Heart Cath and Coronary Angiography;  Surgeon: Yolonda Kida, MD;  Location: [place] [facility];  Service: Cardiovascular;  Laterality: N/A;  . CARDIAC CATHETERIZATION N/A 08/16/2015   Procedure: Coronary  Stent Intervention;  Surgeon: Yolonda Kida, MD;  Location: [place] [facility];  Service: Cardiovascular;  Laterality: N/A;  . CERVIX SURGERY    . PROSTATECTOMY    . SPINE SURGERY    . TONSILLECTOMY    . WRIST SURGERY      Prior to Admission medications   Medication Sig Start Date End Date Taking? Authorizing Provider  abiraterone Acetate (ZYTIGA) 250 MG tablet Take 4 tablets (1,000 mg total) by mouth daily. Take on an empty stomach 1 hour before or 2 hours after a meal Patient taking differently: Take 500 mg by mouth daily. Take on an empty stomach 1 hour before or 2 hours after a meal 04/18/16  Yes [name], MD  acetaminophen (TYLENOL) 500 MG tablet Take 1,000 mg by mouth every 8 (eight) hours as needed.  12/03/12  Yes [provider]  aspirin 81 MG tablet Take 81 mg by mouth daily.   Yes [provider]  atorvastatin (LIPITOR) 80 MG tablet Take 1 tablet (80 mg total) by mouth daily at 6 PM. 04/27/16  Yes Gollan, [name], MD  clopidogrel (PLAVIX) 75 MG tablet Take 1 tablet (75 mg total) by mouth daily. 04/27/16  Yes Gollan, [name], MD  fentaNYL (DURAGESIC - DOSED MCG/HR) 100 MCG/HR Place 1 patch (100 mcg total) onto the skin every 3 (three) days. 07/24/16  Yes [name], MD  nitroGLYCERIN (NITROSTAT) 0.4 MG SL tablet Place 0.4 mg under the tongue every 5 (five) minutes as needed.  04/07/16  Yes [provider]  Oxycodone HCl 10 MG TABS One pill every 8 hours as needed for pain 07/24/16  Yes Brahmanday, Elisha Headland, MD  polyethylene glycol (MIRALAX / GLYCOLAX) packet Take 17 g by mouth daily as needed for moderate constipation.   Yes [provider]  predniSONE (DELTASONE) 5 MG tablet Take 5 mg by mouth 2 (two) times daily with a meal.   Yes [provider]  Wheat Dextrin (BENEFIBER) POWD Stir 2 tsp. TID into 4-8 oz of any non-carbonated beverage or soft food (hot or cold) 12/07/14  Yes Milinda Pointer, MD     Allergies Patient has no known allergies.  Family History  Problem Relation Age of Onset  . Heart attack Mother   . Hypertension Mother   . Heart attack Father     Social History Social History  Substance Use Topics  . Smoking status: Current Every Day Smoker    Packs/day: 0.25    Years: 40.00    Types: Cigarettes  . Smokeless tobacco: Current User     Comment: Down to 3 cigarettes a day   . Alcohol use No    Review of Systems Constitutional: No fever/chills Eyes: No visual changes. ENT: No sore throat. No stiff neck no neck pain Cardiovascular: Positive chest pain. Respiratory: Positive shortness of breath. Gastrointestinal:   no vomiting.  No diarrhea.  No constipation. Genitourinary: Negative for dysuria. Musculoskeletal: Negative lower extremity swelling Skin: Negative for rash. Neurological: Negative for severe headaches, focal weakness or numbness.   ____________________________________________   PHYSICAL EXAM:  VITAL SIGNS: ED Triage Vitals  Enc Vitals Group  BP 08/02/16 1642 (!) 167/89     Pulse Rate 08/02/16 1642 81     Resp 08/02/16 1642 18     Temp 08/02/16 1642 98.4 F (36.9 C)     Temp Source 08/02/16 1642 Oral     SpO2 08/02/16 1642 98 %     Weight 08/02/16 1643 181 lb (82.1 kg)     Height 08/02/16 1643 5\' 10"  (1.778 m)     Head Circumference --      Peak Flow --      Pain Score 08/02/16 1643 2     Pain Loc --      Pain Edu? --      Excl. in [place]? --     Constitutional: Alert and oriented. Well appearing and in no acute distress.Mildly anxious Eyes: Conjunctivae are normal Head: Atraumatic HEENT: No congestion/rhinnorhea. Mucous membranes are moist.  Oropharynx non-erythematous Neck:   Nontender with no meningismus, no masses, no stridor Cardiovascular: Normal rate, regular rhythm. Grossly normal heart sounds.  Good peripheral circulation. Respiratory: Normal respiratory effort.  No retractions. Lungs CTAB. Abdominal: Soft and  nontender. No distention. No guarding no rebound Back:  There is no focal tenderness or step off.  there is no midline tenderness there are no lesions noted. there is no CVA tenderness Musculoskeletal: No lower extremity tenderness, no upper extremity tenderness. No joint effusions, no DVT signs strong distal pulses no edema Neurologic:  Normal speech and language. No gross focal neurologic deficits are appreciated.  Skin:  Skin is warm, dry and intact. No rash noted. Psychiatric: Mood and affect are anxious. Speech and behavior are normal.  ____________________________________________   LABS (all labs ordered are listed, but only abnormal results are displayed)  Labs Reviewed  BASIC METABOLIC PANEL - Abnormal; Notable for the following:       Result Value   Glucose, Bld 123 (*)    All other components within normal limits  CBC - Abnormal; Notable for the following:    RBC 4.05 (*)    Hemoglobin 12.9 (*)    HCT 37.9 (*)    RDW 15.6 (*)    Platelets 134 (*)    All other components within normal limits  TROPONIN I  PROTIME-INR  APTT   ____________________________________________  EKG  I personally interpreted any EKGs ordered by me or triage EKG shows sinus rhythm rate 77 bpm no acute ST elevation or depression, normal axis no acute ischemic changes ____________________________________________  RADIOLOGY  I reviewed any imaging ordered by me or triage that were performed during my shift and, if possible, patient and/or family made aware of any abnormal findings. ____________________________________________   PROCEDURES  Procedure(s) performed: None  Procedures  Critical Care performed: CRITICAL CARE Performed by: Schuyler Amor   Total critical care time: 40 minutes  Critical care time was exclusive of separately billable procedures and treating other patients.  Critical care was necessary to treat or prevent imminent or life-threatening  deterioration.  Critical care was time spent personally by me on the following activities: development of treatment plan with patient and/or surrogate as well as nursing, discussions with consultants, evaluation of patient's response to treatment, examination of patient, obtaining history from patient or surrogate, ordering and performing treatments and interventions, ordering and review of laboratory studies, ordering and review of radiographic studies, pulse oximetry and re-evaluation of patient's condition.   ____________________________________________   INITIAL IMPRESSION / ASSESSMENT AND PLAN / ED COURSE  Pertinent labs & imaging results that were available  during my care of the patient were reviewed by me and considered in my medical decision making (see chart for details). Patient with likely anginal:, Pain is nearly gone at this time initial troponin is negative. I did discuss with his on-call urologist, Dr. Karlene Lineman her, who recommends heparin and admission. Patient pain is as far well controlled we will continue to monitor him. Heparin has been ordered  ----------------------------------------- 6:02 PM on 08/02/2016 -----------------------------------------  Patient's blood pressure in the 150s at this time which is not far from baseline, he is resting comfortably is pain-free.   ____________________________________________   FINAL CLINICAL IMPRESSION(S) / ED DIAGNOSES  Final diagnoses:  Unstable angina ([place])      This chart was dictated using voice recognition software.  Despite best efforts to proofread,  errors can occur which can change meaning.      Schuyler Amor, MD 08/02/16 1757    Schuyler Amor, MD 08/02/16 Joan Flores, MD 08/02/16 [phone]

## 2016-08-02 NOTE — ED Notes (Signed)
Patient had an episode on the monitor of tachycardia >160. This was printed and shown to the EDP and the admitting physician.

## 2016-08-02 NOTE — ED Notes (Signed)
Notified Katlyn, RN of the patient's ready bed.

## 2016-08-03 ENCOUNTER — Telehealth: Payer: Self-pay | Admitting: Cardiovascular Disease

## 2016-08-03 ENCOUNTER — Other Ambulatory Visit: Payer: Self-pay | Admitting: Internal Medicine

## 2016-08-03 ENCOUNTER — Encounter: Payer: Self-pay | Admitting: Nurse Practitioner

## 2016-08-03 ENCOUNTER — Ambulatory Visit: Payer: 59 | Admitting: Nurse Practitioner

## 2016-08-03 DIAGNOSIS — F17210 Nicotine dependence, cigarettes, uncomplicated: Secondary | ICD-10-CM | POA: Diagnosis not present

## 2016-08-03 DIAGNOSIS — R079 Chest pain, unspecified: Secondary | ICD-10-CM | POA: Diagnosis not present

## 2016-08-03 DIAGNOSIS — C61 Malignant neoplasm of prostate: Secondary | ICD-10-CM | POA: Diagnosis not present

## 2016-08-03 DIAGNOSIS — Z720 Tobacco use: Secondary | ICD-10-CM | POA: Diagnosis not present

## 2016-08-03 DIAGNOSIS — I25110 Atherosclerotic heart disease of native coronary artery with unstable angina pectoris: Secondary | ICD-10-CM | POA: Diagnosis not present

## 2016-08-03 DIAGNOSIS — M549 Dorsalgia, unspecified: Secondary | ICD-10-CM | POA: Diagnosis not present

## 2016-08-03 DIAGNOSIS — E782 Mixed hyperlipidemia: Secondary | ICD-10-CM | POA: Diagnosis not present

## 2016-08-03 DIAGNOSIS — J441 Chronic obstructive pulmonary disease with (acute) exacerbation: Secondary | ICD-10-CM | POA: Diagnosis not present

## 2016-08-03 DIAGNOSIS — R0789 Other chest pain: Secondary | ICD-10-CM | POA: Diagnosis not present

## 2016-08-03 DIAGNOSIS — F17200 Nicotine dependence, unspecified, uncomplicated: Secondary | ICD-10-CM | POA: Diagnosis not present

## 2016-08-03 DIAGNOSIS — C7951 Secondary malignant neoplasm of bone: Secondary | ICD-10-CM | POA: Diagnosis not present

## 2016-08-03 DIAGNOSIS — G893 Neoplasm related pain (acute) (chronic): Secondary | ICD-10-CM | POA: Diagnosis not present

## 2016-08-03 DIAGNOSIS — F329 Major depressive disorder, single episode, unspecified: Secondary | ICD-10-CM | POA: Diagnosis not present

## 2016-08-03 DIAGNOSIS — I25118 Atherosclerotic heart disease of native coronary artery with other forms of angina pectoris: Secondary | ICD-10-CM

## 2016-08-03 DIAGNOSIS — I10 Essential (primary) hypertension: Secondary | ICD-10-CM | POA: Diagnosis not present

## 2016-08-03 DIAGNOSIS — E785 Hyperlipidemia, unspecified: Secondary | ICD-10-CM | POA: Diagnosis not present

## 2016-08-03 LAB — CBC
HEMATOCRIT: 36.4 % — AB (ref 40.0–52.0)
HEMOGLOBIN: 12.4 g/dL — AB (ref 13.0–18.0)
MCH: 32.4 pg (ref 26.0–34.0)
MCHC: 34.2 g/dL (ref 32.0–36.0)
MCV: 94.9 fL (ref 80.0–100.0)
Platelets: 126 10*3/uL — ABNORMAL LOW (ref 150–440)
RBC: 3.83 MIL/uL — AB (ref 4.40–5.90)
RDW: 15.5 % — ABNORMAL HIGH (ref 11.5–14.5)
WBC: 4.1 10*3/uL (ref 3.8–10.6)

## 2016-08-03 LAB — BASIC METABOLIC PANEL
Anion gap: 5 (ref 5–15)
BUN: 18 mg/dL (ref 6–20)
CHLORIDE: 109 mmol/L (ref 101–111)
CO2: 27 mmol/L (ref 22–32)
Calcium: 8.8 mg/dL — ABNORMAL LOW (ref 8.9–10.3)
Creatinine, Ser: 0.71 mg/dL (ref 0.61–1.24)
GFR calc non Af Amer: >60 mL/min (ref >60)
Glucose, Bld: 100 mg/dL — ABNORMAL HIGH (ref 65–99)
Potassium: 3.5 mmol/L (ref 3.5–5.1)
Sodium: 141 mmol/L (ref 135–145)

## 2016-08-03 LAB — TROPONIN I: Troponin I: <0.03 ng/mL (ref <0.03)

## 2016-08-03 LAB — HEPARIN LEVEL (UNFRACTIONATED): Heparin Unfractionated: 0.41 IU/mL (ref 0.30–0.70)

## 2016-08-03 MED ORDER — ONDANSETRON HCL 4 MG PO TABS: 4.0000 mg | ORAL_TABLET | ORAL | Status: DC

## 2016-08-03 MED ORDER — METOPROLOL TARTRATE 25 MG PO TABS: 25.0000 mg | ORAL_TABLET | Freq: Two times a day (BID) | ORAL | 1 refills | Status: DC

## 2016-08-03 MED ORDER — OXYCODONE HCL 5 MG PO TABS: 10.0000 mg | ORAL_TABLET | Freq: Three times a day (TID) | ORAL | Status: DC | PRN

## 2016-08-03 MED ORDER — LISINOPRIL 2.5 MG PO TABS: 2.5000 mg | ORAL_TABLET | Freq: Every day | ORAL | 0 refills | Status: DC

## 2016-08-03 MED ORDER — OXYCODONE-ACETAMINOPHEN 5-325 MG PO TABS
2.0000 | ORAL_TABLET | Freq: Once | ORAL | Status: AC
  Administered 2016-08-03: 2 via ORAL
  Filled 2016-08-03: qty 2

## 2016-08-03 MED ORDER — PANTOPRAZOLE SODIUM 40 MG PO TBEC: 40.0000 mg | DELAYED_RELEASE_TABLET | Freq: Every day | ORAL | Status: DC

## 2016-08-03 MED ORDER — ONDANSETRON HCL 4 MG/2ML IJ SOLN: 4.0000 mg | INTRAMUSCULAR | Status: DC

## 2016-08-03 MED ORDER — ONDANSETRON HCL 4 MG/2ML IJ SOLN
4.0000 mg | INTRAMUSCULAR | Status: DC
  Filled 2016-08-03: qty 2

## 2016-08-03 MED ORDER — PANTOPRAZOLE SODIUM 40 MG PO TBEC: 40.0000 mg | DELAYED_RELEASE_TABLET | Freq: Every day | ORAL | 0 refills | Status: DC

## 2016-08-03 NOTE — Progress Notes (Signed)
Notified MD of pt with h/a 10/10. Orders placed. Will continue to monitor and assess.

## 2016-08-03 NOTE — Progress Notes (Signed)
ANTICOAGULATION CONSULT NOTE - Initial Consult  Pharmacy Consult for heparin Indication: chest pain/ACS  No Known Allergies  Patient Measurements: Height: 5\' 10"  (177.8 cm) Weight: 181 lb (82.1 kg) IBW/kg (Calculated) : 73 Heparin Dosing Weight: 82.1 kg  Vital Signs: Temp: 98.4 F (36.9 C) (08/01 2110) Temp Source: Oral (08/01 2110) BP: 172/83 (08/01 2110) Pulse Rate: 69 (08/01 2110)  Labs:  Recent Labs  08/02/16 1645 08/02/16 1717 08/02/16 2122 08/03/16 0059  HGB 12.9*  --   --  12.4*  HCT 37.9*  --   --  36.4*  PLT 134*  --   --  126*  APTT  --  35  --   --   LABPROT  --  12.8  --   --   INR  --  0.96  --   --   HEPARINUNFRC  --   --   --  0.41  CREATININE 0.79  --   --   --   TROPONINI <0.03  --  <0.03  --     Estimated Creatinine Clearance: 95.1 mL/min (by C-G formula based on SCr of 0.79 mg/dL).   Medical History: Past Medical History:  Diagnosis Date  . ASCVD (arteriosclerotic cardiovascular disease)   . Back pain   . Back pain 10/09/2012  . Bone cancer ([place])   . Cancer associated pain   . Depression   . History of kidney stones   . Hypertension   . Joint pain   . Prostate cancer ([place])   . Right arm pain 01/10/2016  . Right foot pain 01/10/2016  . Right leg pain 01/10/2016  . SOB (shortness of breath) 10/09/2012  . Unable to ambulate 10/09/2012    Medications:  Infusions:  . heparin 1,000 Units/hr (08/02/16 1912)    Assessment: 66 yom cc CP with significant ACS history, multiple stents most recently in April. Initial troponin negative, ED ECG with accelerated junctional rhythym can't r/o anterior infarct. Pharmacy consulted to dose heparin for ACS. No PTA OAC noted.    Goal of Therapy:  Heparin level 0.3-0.7 units/ml Monitor platelets by anticoagulation protocol: Yes   Plan:  Give 4000 units bolus x 1 Start heparin infusion at 1000 units/hr Check anti-Xa level in 6 hours and daily while on heparin Continue to monitor H&H and platelets    8/2 @ 0058 HL 0.41 therapeutic. Will continue current rate and will recheck next HL @ 0900.  Tobie Lords, Pharm.D., BCPS Clinical Pharmacist 08/03/2016,1:26 AM

## 2016-08-03 NOTE — Telephone Encounter (Signed)
Attempted to contact pt regarding discharge from [facility] on 08/03/16. Left message asking pt to call back regarding discharge instructions and/or medications. Advised pt of appt w/ Dr. [name] on 08/18/16 at 2:40 w/ [place]. Asked pt to call back if unable to keep this appt.

## 2016-08-03 NOTE — Consult Note (Signed)
Cardiology Consult    Patient ID: SAED HUDLOW MRN: [id number], DOB/AGE: 1950-08-20   Admit date: 08/02/2016 Date of Consult: 08/03/2016  Primary Physician: Olin Hauser, DO Primary Cardiologist: Johnny Bridge, MD  Requesting Provider: Q. Bridgett Larsson, MD  Patient Profile    Robert Short is a 66 y.o. male with a history of CAD s/p prior RPAV and RPL stenting, HTN, HL, tob abuse, and prostate CA w/ bone mets, who is being seen today for the evaluation of chest pain at the request of Dr. Estanislado Spire.  Past Medical History   Past Medical History:  Diagnosis Date  . Back pain 10/09/2012  . Bone cancer ([place])   . CAD (coronary artery disease)    a. 08/2015 PCI: LM nl, LAD 20d, LCX nl, RCA 96m, RPAV 95 (3.0x18 Xience Alpine DES);  b. 04/2016 PCI [facility]): RPL 95 (2.75x8 Promus Premier DES).  . Cancer associated pain   . Depression   . History of echocardiogram    a. 08/2016 Echo: EF 50-55%.  Marland Kitchen History of kidney stones   . Hyperlipidemia   . Hypertension   . Joint pain   . Prostate cancer [facility])    a.  s/p prostatectomy ([facility]);  b. bone mets noted 04/2016.  . Right arm pain 01/10/2016  . Right foot pain 01/10/2016  . Right leg pain 01/10/2016  . Tobacco abuse     Past Surgical History:  Procedure Laterality Date  . CARD[facility]AC CATHETERIZATION     [facility]  . CARDIAC CATHETERIZATION N/A 08/16/2015   Procedure: Left Heart Cath and Coronary Angiography;  Surgeon: Yolonda Kida, MD;  Location: [place] [facility];  Service: Cardiovascular;  Laterality: N/A;  . CARDIAC CATHETERIZATION N/A 08/16/2015   Procedure: Coronary Stent Intervention;  Surgeon: Yolonda Kida, MD;  Location: [place] [facility];  Service: Cardiovascular;  Laterality: N/A;  . CERVIX SURGERY    . PROSTATECTOMY    . SPINE SURGERY    . TONSILLECTOMY    . WRIST SURGERY       Allergies  No Known Allergies  History of Present Illness    66 y/o ? with the above complex PMH including CAD s/p RPAV DES in 08/2015 with  subsequent PCI and DES to the RPL in [place] in April, in the setting of recurrent angina, which he describes as upper back pain.  Other hx includes, HTN, HL, Tob abuse, and prostate cancer s/p prostatectomy.  He has known bone mets and is followed by oncology (X-geva q 3 months).  In the setting of chronic back and bone pain, he is on chronic narcotics @ home.  He says that he has had intermittent chest pain (different from his anginal back pain) since his 20's, and has had fairly extensive w/u over the years.  He isn't sure if he's ever had a GI w/u.  He is disabled in the setting of chronic back pain and does not routinely exercise.  He continues to smoke 1ppd.  He was in his usoh until last Friday, when he was walking in [employer] and developed chest discomfort.  He did not have any ntg on him and so pain persisted for nearly an hour before he returned home and took sl ntg.  He did well over last weekend and earlier this week w/o recurrence but then on 8/1, he was walking in [place] and had recurrent retrosternal chest discomfort w/o associated Ss.  He took a ntg with almost immediate relief.  When he returned home, his wife called the office and they were advised to present to the ED for any further c/p.  Pt says that he did not have any further chest pain but his wife wanted him to be evaluated and so he presented to the ED anyway.  Here, ECG was non-acute.  Trop nl.  He was placed on heparin and nitropaste and developed a headache.  This was treated with pain medicine and followed by nausea.  He says that he has been feeling "miserable" since admission - malaise, n, headache.  He currently denies chest or back pain.  Inpatient Medications    . abiraterone Acetate  500 mg Oral Daily  . aspirin EC  81 mg Oral Daily  . atorvastatin  80 mg Oral q1800  . clopidogrel  75 mg Oral Daily  . [START ON 08/04/2016] fentaNYL  100 mcg Transdermal Q72H  . lisinopril  2.5 mg Oral Daily  . metoprolol  tartrate  25 mg Oral BID  . predniSONE  5 mg Oral BID WC    Family History    Family History  Problem Relation Age of Onset  . Heart attack Mother   . Hypertension Mother   . Heart attack Father     Social History    Social History   Social History  . Marital status: Married    Spouse name: N/A  . Number of children: N/A  . Years of education: N/A   Occupational History  . Disabled     since 1999 - chronic back pain and DDD.   Social History Main Topics  . Smoking status: Current Every Day Smoker    Packs/day: 1.00    Years: 40.00    Types: Cigarettes  . Smokeless tobacco: Current User  . Alcohol use No     Comment: occasionally  . Drug use: No  . Sexual activity: Not on file   Other Topics Concern  . Not on file   Social History Narrative   Lives in [place] with wife.  Does not routinely exercise.     Review of Systems    General:  +++ headache.  No chills, fever, night sweats or weight changes.  Cardiovascular:  +++ chest pain prior to admission, no dyspnea on exertion, edema, orthopnea, palpitations, paroxysmal nocturnal dyspnea. Dermatological: No rash, lesions/masses Respiratory: No cough, dyspnea Urologic: No hematuria, dysuria Abdominal:   +++ nausea since admission, no vomiting, diarrhea, bright red blood per rectum, melena, or hematemesis Neurologic:  No visual changes, wkns, changes in mental status. MSK: +++ chronic back pain. All other systems reviewed and are otherwise negative except as noted above.  Physical Exam    Blood pressure (!) 158/73, pulse 64, temperature 98.5 F (36.9 C), resp. rate 18, height 5\' 10"  (1.778 m), weight 181 lb (82.1 kg), SpO2 97 %.  General: NAD Psych: Flat affect. Neuro: Alert and oriented X 3. Moves all extremities spontaneously. HEENT: Normal  Neck: Supple without bruits or JVD. Lungs:  Resp regular and unlabored, diminished breath sounds with scattered rhonchi bilat. Heart: RRR no s3, s4, or  murmurs. Abdomen: Soft, non-tender, non-distended, BS + x 4.  Extremities: No clubbing, cyanosis or edema. DP/PT/Radials 2+ and equal bilaterally.  Labs     Recent Labs  08/02/16 1645 08/02/16 2122 08/03/16 0059 08/03/16 0531  TROPONINI <0.03 <0.03 <0.03 <0.03   Lab Results  Component Value Date   WBC 4.1 08/03/2016   HGB 12.4 (L) 08/03/2016   HCT 36.4 (L)  08/03/2016   MCV 94.9 08/03/2016   PLT 126 (L) 08/03/2016     Recent Labs Lab 08/03/16 0059  NA 141  K 3.5  CL 109  CO2 27  BUN 18  CREATININE 0.71  CALCIUM 8.8*  GLUCOSE 100*   Lab Results  Component Value Date   CHOL 217 (H) 02/23/2016   HDL 58 02/23/2016   LDLCALC 118 (H) 02/23/2016   TRIG 204 (H) 02/23/2016     Radiology Studies    Dg Chest 2 View  Result Date: 08/02/2016 CLINICAL DATA:  Generalized chest pain EXAM: CHEST  2 VIEW COMPARISON:  08/16/2015.  Chest CT 02/11/2015. FINDINGS: Heart is normal size. Small nodular densities project over both lower lobes. This may reflect nipple shadows. No effusions. Diffuse sclerotic lesions throughout the bones as seen on prior CT compatible with bony metastatic disease. IMPRESSION: Small nodular densities project over both lower lobes, possibly nipple shadows. Recommend repeating with nipple markers. Innumerable sclerotic lesions throughout the bones compatible with sclerotic bony metastases as seen on prior CT. Electronically Signed   By: Rolm Baptise M.D.   On: 08/02/2016 17:40    ECG & Cardiac Imaging    RSR, 78, 1st deg AVB, no acute st/t changes.  Assessment & Plan    1.  Precordial chest pain/CAD:  Pt with a h/o CAD s/p prior RPAV (08/2015) and RPL (04/2016) DESs.  Prior anginal equivalent was upper back pain.  He presented to the ED on 8/1 after an episode of nitrate responsive retrosternal chest discomfort - different from prior angina.  He says that he has been having this type of chest discomfort intermittently since his 20's.  Since admission, troponins  have remained nl.  He has had no recurrent chest pain but in the setting of nitropaste, he has had severe headache and nausea, but no further chest pain.  In the absence of objective evidence of ischemia, he can ambulate this AM and if he feels well, he could likely be discharged and we can arrange for outpt f/u and stress testing.  Cont asa, statin, plavix,  blocker, and acei.  Add PPI.  2. Essential HTN:  BP elevated this am.  F/u after morning meds.  Was not previously on  blocker or acei.  3.  HL:  Cont statin Rx - LDL 118 in 02/2016.  4.  Tob Abuse:  Still smoking 1ppd.  He has tried quitting before and says he feels better when he smokes.  Cessation advised however he is not interested in quitting.  5.  Prostate Cancer w/ Bone Mets:  Followed by oncology.  Chronic narcotics for bone and back pain.  Signed, Murray Hodgkins, NP 08/03/2016, 9:23 AM

## 2016-08-03 NOTE — Progress Notes (Signed)
Pt to be discharged this afternoon. Headache finally resolved. Pt walked in hallway and tolerated it well. Iv's and tele removed. disch instructions given to pt. disch via w.c. Accompanied by wife.

## 2016-08-03 NOTE — Discharge Instructions (Signed)
Heart healthy diet. °Smoking cessation. °

## 2016-08-03 NOTE — Discharge Summary (Signed)
[facility] NAME: Robert Short    MR#:  [id number]  DATE OF BIRTH:  24-Mar-1950  DATE OF ADMISSION:  08/02/2016   ADMITTING PHYSICIAN: Henreitta Leber, MD  DATE OF DISCHARGE: 08/03/2016  1:38 PM  PRIMARY CARE PHYSICIAN: Olin Hauser, DO   ADMISSION DIAGNOSIS:  Unstable angina ([place]) [I20.0] DISCHARGE DIAGNOSIS:  Active Problems:   Chest pain  SECONDARY DIAGNOSIS:   Past Medical History:  Diagnosis Date  . Back pain 10/09/2012  . Bone cancer ([place])   . CAD (coronary artery disease)    a. 08/2015 PCI: LM nl, LAD 20d, LCX nl, RCA 76m, RPAV 95 (3.0x18 Xience Alpine DES);  b. 04/2016 PCI [facility]): RPL 95 (2.75x8 Promus Premier DES).  . Cancer associated pain   . Depression   . History of echocardiogram    a. 08/2016 Echo: EF 50-55%.  Marland Kitchen History of kidney stones   . Hyperlipidemia   . Hypertension   . Joint pain   . Prostate cancer [facility])    a.  s/p prostatectomy ([facility]);  b. bone mets noted 04/2016.  . Right arm pain 01/10/2016  . Right foot pain 01/10/2016  . Right leg pain 01/10/2016  . Tobacco abuse    HOSPITAL COURSE:   Robert Short  is a 66 y.o. male with a known history of Coronary artery disease status post stent placement, hypertension, hyperlipidemia, history of prostate cancer, COPD, ongoing tobacco abuse, peripheral vascular disease, depression who presented to the hospital complaining of chest pain.   1. Atypical Chest pain-patient does have significant risk factors given his previous history of coronary disease and stent placement, ongoing tobacco abuse. -His EKG shows no acute ST or T-wave changes. His first set of cardiac markers are negative. Normal cardiac markers. No indication for catheterization and Stress test as outpatient, could be done at a later date if he has recurrent symptoms per cardiology consult.  -Continue aspirin, Plavix, atorvastatin, discontinued heparin drip. -Start patient on  low-dose metoprolol and lisinopril.  2. Chronic pain-continue fentanyl patch, oxycodone as needed.  3. Hyperlipidemia-continue atorvastatin.  4. COPD with ongoing tobacco abuse-no acute exacerbation. -Continue maintenance prednisone. Tobacco abuse. Smoking cessation was counseled for 3-4 minutes.  I discussed with the cardiology NP. DISCHARGE CONDITIONS:  Stable, discharged to home today. CONSULTS OBTAINED:  Treatment Team:  Minna Merritts, MD DRUG ALLERGIES:  No Known Allergies DISCHARGE MEDICATIONS:   Allergies as of 08/03/2016   No Known Allergies     Medication List    TAKE these medications   abiraterone Acetate 250 MG tablet Commonly known as:  ZYTIGA Take 4 tablets (1,000 mg total) by mouth daily. Take on an empty stomach 1 hour before or 2 hours after a meal What changed:  how much to take  additional instructions   acetaminophen 500 MG tablet Commonly known as:  TYLENOL Take 1,000 mg by mouth every 8 (eight) hours as needed.   aspirin 81 MG tablet Take 81 mg by mouth daily.   atorvastatin 80 MG tablet Commonly known as:  LIPITOR Take 1 tablet (80 mg total) by mouth daily at 6 PM.   BENEFIBER Powd Stir 2 tsp. TID into 4-8 oz of any non-carbonated beverage or soft food (hot or cold)   clopidogrel 75 MG tablet Commonly known as:  PLAVIX Take 1 tablet (75 mg total) by mouth daily.   fentaNYL 100 MCG/HR Commonly known as:  DURAGESIC - dosed mcg/hr Place 1  patch (100 mcg total) onto the skin every 3 (three) days.   lisinopril 2.5 MG tablet Commonly known as:  PRINIVIL,ZESTRIL Take 1 tablet (2.5 mg total) by mouth daily.   metoprolol tartrate 25 MG tablet Commonly known as:  LOPRESSOR Take 1 tablet (25 mg total) by mouth 2 (two) times daily.   nitroGLYCERIN 0.4 MG SL tablet Commonly known as:  NITROSTAT Place 0.4 mg under the tongue every 5 (five) minutes as needed.   Oxycodone HCl 10 MG Tabs One pill every 8 hours as needed for pain     pantoprazole 40 MG tablet Commonly known as:  PROTONIX Take 1 tablet (40 mg total) by mouth daily at 6 (six) AM.   polyethylene glycol packet Commonly known as:  MIRALAX / GLYCOLAX Take 17 g by mouth daily as needed for moderate constipation.   predniSONE 5 MG tablet Commonly known as:  DELTASONE Take 5 mg by mouth 2 (two) times daily with a meal.        DISCHARGE INSTRUCTIONS:  See AVS.  If you experience worsening of your admission symptoms, develop shortness of breath, life threatening emergency, suicidal or homicidal thoughts you must seek medical attention immediately by calling 911 or calling your MD immediately  if symptoms less severe.  You Must read complete instructions/literature along with all the possible adverse reactions/side effects for all the Medicines you take and that have been prescribed to you. Take any new Medicines after you have completely understood and accpet all the possible adverse reactions/side effects.   Please note  You were cared for by a hospitalist during your hospital stay. If you have any questions about your discharge medications or the care you received while you were in the hospital after you are discharged, you can call the unit and asked to speak with the hospitalist on call if the hospitalist that took care of you is not available. Once you are discharged, your primary care physician will handle any further medical issues. Please note that NO REFILLS for any discharge medications will be authorized once you are discharged, as it is imperative that you return to your primary care physician (or establish a relationship with a primary care physician if you do not have one) for your aftercare needs so that they can reassess your need for medications and monitor your lab values.    On the day of Discharge:  VITAL SIGNS:  Blood pressure (!) 158/73, pulse 64, temperature 98.5 F (36.9 C), resp. rate 18, height 5\' 10"  (1.778 m), weight 181 lb  (82.1 kg), SpO2 97 %. PHYSICAL EXAMINATION:  GENERAL:  66 y.o.-year-old patient lying in the bed with no acute distress.  EYES: Pupils equal, round, reactive to light and accommodation. No scleral icterus. Extraocular muscles intact.  HEENT: Head atraumatic, normocephalic. Oropharynx and nasopharynx clear.  NECK:  Supple, no jugular venous distention. No thyroid enlargement, no tenderness.  LUNGS: Normal breath sounds bilaterally, no wheezing, rales,rhonchi or crepitation. No use of accessory muscles of respiration.  CARDIOVASCULAR: S1, S2 normal. No murmurs, rubs, or gallops.  ABDOMEN: Soft, non-tender, non-distended. Bowel sounds present. No organomegaly or mass.  EXTREMITIES: No pedal edema, cyanosis, or clubbing.  NEUROLOGIC: Cranial nerves II through XII are intact. Muscle strength 5/5 in all extremities. Sensation intact. Gait not checked.  PSYCHIATRIC: The patient is alert and oriented x 3.  SKIN: No obvious rash, lesion, or ulcer.  DATA REVIEW:   CBC  Recent Labs Lab 08/03/16 0059  WBC 4.1  HGB 12.4*  HCT 36.4*  PLT 126*    Chemistries   Recent Labs Lab 08/03/16 0059  NA 141  K 3.5  CL 109  CO2 27  GLUCOSE 100*  BUN 18  CREATININE 0.71  CALCIUM 8.8*     Microbiology Results  Results for orders placed or performed during the hospital encounter of 08/16/15  MRSA PCR Screening     Status: None   Collection Time: 08/16/15  6:33 PM  Result Value Ref Range Status   MRSA by PCR NEGATIVE NEGATIVE Final    Comment:        The GeneXpert MRSA Assay (FDA approved for NASAL specimens only), is one component of a comprehensive MRSA colonization surveillance program. It is not intended to diagnose MRSA infection nor to guide or monitor treatment for MRSA infections.     RADIOLOGY:  No results found.   Management plans discussed with the patient, His wife and they are in agreement.  CODE STATUS: Prior   TOTAL TIME TAKING CARE OF THIS PATIENT: 33 minutes.     Demetrios Loll M.D on 08/03/2016 at 6:27 PM  Between 7am to 6pm - Pager - [phone]  After 6pm go to [email or web address] - Proofreader  [facility] [employer] [facility]  Office  [phone]  CC: Primary care physician; Olin Hauser, DO   Note: This dictation was prepared with Dragon dictation along with smaller phrase technology. Any transcriptional errors that result from this process are unintentional.

## 2016-08-03 NOTE — Telephone Encounter (Signed)
TCM... Pt was seen in hospital by Ignacia Bayley He is a patient of Dr [name]  Being discharged later today  He is scheduled for 07/18/16 to see Dr [name]  Please call

## 2016-08-03 NOTE — Progress Notes (Signed)
Notified by CCMD of pt's hr down to 38 non sustained. In to check on pt. Will continue to asess.

## 2016-08-03 NOTE — Telephone Encounter (Signed)
Currently admitted.

## 2016-08-10 ENCOUNTER — Ambulatory Visit (INDEPENDENT_AMBULATORY_CARE_PROVIDER_SITE_OTHER): Payer: 59 | Admitting: Nurse Practitioner

## 2016-08-10 ENCOUNTER — Encounter: Payer: Self-pay | Admitting: Nurse Practitioner

## 2016-08-10 VITALS — BP 149/81 | HR 62 | Temp 98.9°F | Ht 70.0 in | Wt 186.0 lb

## 2016-08-10 DIAGNOSIS — J22 Unspecified acute lower respiratory infection: Secondary | ICD-10-CM

## 2016-08-10 MED ORDER — BENZONATATE 100 MG PO CAPS: 100.0000 mg | ORAL_CAPSULE | Freq: Three times a day (TID) | ORAL | 0 refills | Status: DC | PRN

## 2016-08-10 MED ORDER — AZITHROMYCIN 250 MG PO TABS: ORAL_TABLET | ORAL | 0 refills | Status: DC

## 2016-08-10 MED ORDER — PREDNISONE 50 MG PO TABS: 50.0000 mg | ORAL_TABLET | Freq: Every day | ORAL | 0 refills | Status: AC

## 2016-08-10 MED ORDER — ALBUTEROL SULFATE HFA 108 (90 BASE) MCG/ACT IN AERS: 1.0000 | INHALATION_SPRAY | Freq: Four times a day (QID) | RESPIRATORY_TRACT | 0 refills | Status: AC | PRN

## 2016-08-10 NOTE — Progress Notes (Signed)
Subjective:    Patient ID: Robert Short, male    DOB: 05-24-1950, 66 y.o.   MRN: [id number]  Robert Short is a 66 y.o. male presenting on 08/10/2016 for Sore Throat (cough, chest congestion, with productive cough grey-greenish mucus, bodyaches and fever x 4 days)   HPI  Sore Throat Last night unable to sleep r/t continuous coughing, sore throat, ribs/chest aches from cough.  Pt endorses rhinorrhea.  No measured fever at home.  But feels hot, having chills/sweats.  Denies sinus congestion/pressure, ear pain, tooth/jaw pain. Pt notes onset of symptoms occurred 4 days ago after having done some drywall work at home w/o masks or breathing apparatuses.  Has been taking tylenol 500 mg tablets, 1-3 tablets 3-4 times per day.   Current cigarette user for last few days is only smoking about 1/2 ppd instead of 1 ppd.   Pt is on chemotherapy Zytiga for bone cancer.  Pt also has history of prostate cancer w/ prior treatment.  Pt has also completed radiation for bone cancer. Takes prednisone 5 mg twice daily w/ Zytiga doses.   Social History  Substance Use Topics  . Smoking status: Current Every Day Smoker    Packs/day: 1.00    Years: 40.00    Types: Cigarettes  . Smokeless tobacco: Current User  . Alcohol use No     Comment: occasionally    Review of Systems Per HPI unless specifically indicated above     Objective:    BP (!) 149/81 (BP Location: Left Arm, Patient Position: Sitting, Cuff Size: Normal)   Pulse 62   Temp 98.9 F (37.2 C) (Oral)   Ht 5\' 10"  (1.778 m)   Wt 186 lb (84.4 kg)   SpO2 99%   BMI 26.69 kg/m   Wt Readings from Last 3 Encounters:  08/10/16 186 lb (84.4 kg)  08/02/16 181 lb (82.1 kg)  07/18/16 181 lb 9.6 oz (82.4 kg)    Physical Exam  Constitutional: He is oriented to person, place, and time. He appears well-developed and well-nourished. He appears ill.  HENT:  Head: Normocephalic and atraumatic.  Right Ear: Hearing, tympanic membrane, external ear and ear  canal normal.  Left Ear: Hearing, tympanic membrane, external ear and ear canal normal.  Nose: Rhinorrhea present. No mucosal edema or sinus tenderness. Right sinus exhibits no maxillary sinus tenderness and no frontal sinus tenderness. Left sinus exhibits no maxillary sinus tenderness and no frontal sinus tenderness.  Mouth/Throat: Uvula is midline and mucous membranes are normal. Posterior oropharyngeal erythema present. No oropharyngeal exudate or posterior oropharyngeal edema.  Cardiovascular: Normal rate, regular rhythm and normal heart sounds.   Pulmonary/Chest: Effort normal. He has decreased breath sounds. He has no wheezes. He has no rhonchi. He has no rales.  Positive egophony noted in bilateral lower lobes  Lymphadenopathy:    He has cervical adenopathy.  Neurological: He is alert and oriented to person, place, and time.  Skin: Skin is warm and dry.  Psychiatric: He has a normal mood and affect. His behavior is normal. Judgment and thought content normal.  Vitals reviewed.  Results for orders placed or performed during the hospital encounter of 29/56/21  Basic metabolic panel  Result Value Ref Range   Sodium 140 135 - 145 mmol/L   Potassium 3.5 3.5 - 5.1 mmol/L   Chloride 107 101 - 111 mmol/L   CO2 26 22 - 32 mmol/L   Glucose, Bld 123 (H) 65 - 99 mg/dL   BUN 17  6 - 20 mg/dL   Creatinine, Ser 0.79 0.61 - 1.24 mg/dL   Calcium 9.0 8.9 - 10.3 mg/dL   GFR calc non Af Amer >60 >60 mL/min   GFR calc Af Amer >60 >60 mL/min   Anion gap 7 5 - 15  CBC  Result Value Ref Range   WBC 4.5 3.8 - 10.6 K/uL   RBC 4.05 (L) 4.40 - 5.90 MIL/uL   Hemoglobin 12.9 (L) 13.0 - 18.0 g/dL   HCT 37.9 (L) 40.0 - 52.0 %   MCV 93.6 80.0 - 100.0 fL   MCH 31.9 26.0 - 34.0 pg   MCHC 34.1 32.0 - 36.0 g/dL   RDW 15.6 (H) 11.5 - 14.5 %   Platelets 134 (L) 150 - 440 K/uL  Troponin I  Result Value Ref Range   Troponin I <0.03 <0.03 ng/mL  Protime-INR  Result Value Ref Range   Prothrombin Time 12.8  11.4 - 15.2 seconds   INR 0.96   APTT  Result Value Ref Range   aPTT 35 24 - 36 seconds  Heparin level (unfractionated)  Result Value Ref Range   Heparin Unfractionated 0.41 0.30 - 0.70 IU/mL  Basic metabolic panel  Result Value Ref Range   Sodium 141 135 - 145 mmol/L   Potassium 3.5 3.5 - 5.1 mmol/L   Chloride 109 101 - 111 mmol/L   CO2 27 22 - 32 mmol/L   Glucose, Bld 100 (H) 65 - 99 mg/dL   BUN 18 6 - 20 mg/dL   Creatinine, Ser 0.71 0.61 - 1.24 mg/dL   Calcium 8.8 (L) 8.9 - 10.3 mg/dL   GFR calc non Af Amer >60 >60 mL/min   GFR calc Af Amer >60 >60 mL/min   Anion gap 5 5 - 15  CBC  Result Value Ref Range   WBC 4.1 3.8 - 10.6 K/uL   RBC 3.83 (L) 4.40 - 5.90 MIL/uL   Hemoglobin 12.4 (L) 13.0 - 18.0 g/dL   HCT 36.4 (L) 40.0 - 52.0 %   MCV 94.9 80.0 - 100.0 fL   MCH 32.4 26.0 - 34.0 pg   MCHC 34.2 32.0 - 36.0 g/dL   RDW 15.5 (H) 11.5 - 14.5 %   Platelets 126 (L) 150 - 440 K/uL  Troponin I  Result Value Ref Range   Troponin I <0.03 <0.03 ng/mL  Troponin I  Result Value Ref Range   Troponin I <0.03 <0.03 ng/mL  Troponin I  Result Value Ref Range   Troponin I <0.03 <0.03 ng/mL      Assessment & Plan:   Problem List Items Addressed This Visit    None    Visit Diagnoses    Acute respiratory infection    -  Primary Acute illness. Fever responsive to NSAIDs and tylenol.  Symptoms not worsening. Consistent with viral illness x 4 days with no known sick contacts and no identifiable focal infections of ears, nose, throat. Possible focal infection of lungs with positive egophony, mildly increased work of breathing.  Pt w/ immunocompromised status.  Last chest Xray 08/02/2016 normal w/o s/sx of cardiopulmonary disease.    Plan:  1. Likely to need antibiotics w/ lower respiratory findings indicating likely bacterial infection.  - Start anti-histamine Loratadine 10mg  daily,  - also can use Flonase 2 sprays each nostril daily for up to 4-6 weeks - Start Mucinex-DM OTC up to 7-10  days then stop as needed for congestion  - Start tessalon perles 100 mg every 8 hours  as needed for cough. - Start azithyromycin 250 mg tablets (take 2 tablets today, then one tablet once daily for 4 days). - Start prednisone 50 mg once daily x 5 days - Start albuterol 1-2 puffs prn wheezing and shortness of breath. 2. Supportive care with nasal saline, warm herbal tea with honey, 3. Improve hydration 4. Tylenol / Motrin PRN fevers 5. Return criteria given    Relevant Medications   predniSONE (DELTASONE) 50 MG tablet   albuterol (PROVENTIL HFA;VENTOLIN HFA) 108 (90 Base) MCG/ACT inhaler   azithromycin (ZITHROMAX) 250 MG tablet   benzonatate (TESSALON PERLES) 100 MG capsule      Meds ordered this encounter  Medications  . predniSONE (DELTASONE) 50 MG tablet    Sig: Take 1 tablet (50 mg total) by mouth daily.    Dispense:  5 tablet    Refill:  0    Order Specific Question:   Supervising Provider    Answer:   Olin Hauser [[id number]]  . albuterol (PROVENTIL HFA;VENTOLIN HFA) 108 (90 Base) MCG/ACT inhaler    Sig: Inhale 1-2 puffs into the lungs every 6 (six) hours as needed for wheezing or shortness of breath.    Dispense:  1 Inhaler    Refill:  0    Order Specific Question:   Supervising Provider    Answer:   Olin Hauser [[id number]]  . azithromycin (ZITHROMAX) 250 MG tablet    Sig: Take one tablet twice today.  Then take 1 tablet daily for 4 days.    Dispense:  6 tablet    Refill:  0    Order Specific Question:   Supervising Provider    Answer:   Olin Hauser [[id number]]  . benzonatate (TESSALON PERLES) 100 MG capsule    Sig: Take 1 capsule (100 mg total) by mouth 3 (three) times daily as needed for cough.    Dispense:  21 capsule    Refill:  0    Order Specific Question:   Supervising Provider    Answer:   Olin Hauser [[id number]]      Follow up plan: Return 3-5 days if symptoms worsen or fail to improve.   Cassell Smiles, DNP,  AGPCNP-BC Adult Gerontology Primary Care Nurse Practitioner [facility] 08/11/2016, 1:05 PM

## 2016-08-10 NOTE — Patient Instructions (Addendum)
Robert Short, Thank you for coming in to clinic today.  1. It sounds like you have a Upper Respiratory Infection - this will most likely run it's course in 7 to 10 days. Recommend good hand washing. - I have also prescribed an antibiotic given your immunocompromised status on Zytiga.  Please take azithromycin dose pack (Z-pack). - TAKE prednisone 50 mg once daily for 5 days in addition to your usual doses of prednisone. - Start anti-histamine loratadine (Claritin) 10mg  daily for about 2 weeks,  also can use Flonase 2 sprays each nostril daily for up to 4-6 weeks - If congestion is worse, start OTC Mucinex (or may try Mucinex-DM for cough) up to 7-10 days then stop - Drink plenty of fluids to improve congestion - You may try over the counter Nasal Saline spray (Simply Saline, Ocean Spray) as needed to reduce congestion. - Drink warm herbal tea with honey for sore throat. - Start taking Tylenol extra strength 1 to 2 tablets every 6-8 hours for aches or fever/chills for next few days as needed.  Do not take more than 3,000 mg in 24 hours from all medicines.  If symptoms significantly worsening with persistent fevers/chills despite tylenol/ibpurofen, nausea, vomiting unable to tolerate food/fluids or medicine, body aches, or shortness of breath, sinus pain pressure or worsening productive cough, then follow-up for re-evaluation, may seek more immediate care at Urgent Care or ED if more concerned for emergency.  Please schedule a follow-up appointment with Cassell Smiles, AGNP. Return 3-5 days if symptoms worsen or fail to improve.  If you have any other questions or concerns, please feel free to call the clinic or send a message through [place]. You may also schedule an earlier appointment if necessary.  You will receive a survey after today's visit either digitally by e-mail or paper by [employer]. Your experiences and feedback matter to [place].  Please respond so we know how we are doing as we provide care for  you.   Cassell Smiles, DNP, AGNP-BC Adult Gerontology Nurse Practitioner [place]

## 2016-08-11 NOTE — Progress Notes (Signed)
I have reviewed this encounter including the documentation in this note and/or discussed this patient with the provider, Cassell Smiles, AGPCNP-BC. I am certifying that I agree with the content of this note as supervising physician.  Nobie Putnam, [facility] 08/11/2016, 3:52 PM

## 2016-08-16 ENCOUNTER — Encounter: Payer: Self-pay | Admitting: Family Medicine

## 2016-08-16 ENCOUNTER — Ambulatory Visit (INDEPENDENT_AMBULATORY_CARE_PROVIDER_SITE_OTHER): Payer: 59 | Admitting: Family Medicine

## 2016-08-16 VITALS — BP 132/68 | HR 55 | Temp 98.2°F | Resp 16 | Ht 70.0 in | Wt 184.6 lb

## 2016-08-16 DIAGNOSIS — J209 Acute bronchitis, unspecified: Secondary | ICD-10-CM

## 2016-08-16 DIAGNOSIS — R0789 Other chest pain: Secondary | ICD-10-CM | POA: Diagnosis not present

## 2016-08-16 NOTE — Progress Notes (Signed)
Subjective:    Patient ID: KHALEEL BECKOM, male    DOB: January 31, 1950, 66 y.o.   MRN: [id number]  TYRIQ MORAGNE is a 66 y.o. male presenting on 08/16/2016 for Hospitalization Follow-up   HPI  HOSPITAL FOLLOW-UP VISIT  Hospital/Location: [facility] Date of Admission: 08/02/16 Date of Discharge: 08/03/16 Transitions of care telephone call: attempted by Cardiology [facility] office on 08/03/16 by Dede Query RN but patient not reached, no TOC call completed.  Reason for Admission: Chest pain Primary (+Secondary) Diagnosis: Atypical Chest Pain (ruled out MI)  - Hospital H&P and Discharge Summary have been reviewed - Patient presents today 14 days after recent hospitalization. Brief summary of recent course, patient had symptoms of chest pain / angina, hospitalized for CP rule out, consulted cardiology, had normal cardiac enzymes, unremarkable EKG. Cardiology advised against inpatient stress or cath, and would f/u outpatient, uncertain if chest wall pain or spasm of muscle or GI etiology. - Today reports overall has done well after discharge. Symptoms of chest pain have resolved. However he did develop acute URI vs bronchitis, see below - New medications on discharge: Metoprolol 25mg  BID, Lisinopril 2.5mg  - he has been on these before, was taken off in past due to low BP - Changes to current meds on discharge: none - Scheduled to follow-up with [facility] on Friday 08/18/16 - Additionally he endorses old prior history of atypical intermittent chest pains when he was age 78s  ACUTE BRONCHITIS: - Last visit at [facility] 08/10/16 (saw Cassell Smiles, AGPCNP-BC), for acute visit for sore throat bronchitis, had flu-like symptoms and other acute URI symptoms 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 Treated with Azithromycin Z-pak, Prednisone taper 50mg  daily x 5 day burst, then returned to Prednisone 5 BID with Zytiga (for chemo), Tessalon Perls, Albuterol inhaler  (has used before in past, history of smoking), see prior notes for background information. - Interval update with notable improvement on therapy, just finished antibiotic and prednisone burst - Today patient reports no new concerns, think sputum and cough are now resolving, breath much better. No further concerns today. - admits chronic history cough and dyspnea triggered by rainy weather and humidity - admits some persistent thicker phlegm but improved and reduced amount  I have reviewed the discharge medication list, and have reconciled the current and discharge medications today.   Current Outpatient Prescriptions:  .  abiraterone Acetate (ZYTIGA) 250 MG tablet, Take 4 tablets (1,000 mg total) by mouth daily. Take on an empty stomach 1 hour before or 2 hours after a meal (Patient taking differently: Take 500 mg by mouth daily. Take on an empty stomach 1 hour before or 2 hours after a meal), Disp: 120 tablet, Rfl: 4 .  acetaminophen (TYLENOL) 500 MG tablet, Take 1,000 mg by mouth every 8 (eight) hours as needed. , Disp: , Rfl:  .  albuterol (PROVENTIL HFA;VENTOLIN HFA) 108 (90 Base) MCG/ACT inhaler, Inhale 1-2 puffs into the lungs every 6 (six) hours as needed for wheezing or shortness of breath., Disp: 1 Inhaler, Rfl: 0 .  aspirin 81 MG tablet, Take 81 mg by mouth daily., Disp: , Rfl:  .  atorvastatin (LIPITOR) 80 MG tablet, Take 1 tablet (80 mg total) by mouth daily at 6 PM., Disp: 90 tablet, Rfl: 3 .  clopidogrel (PLAVIX) 75 MG tablet, Take 1 tablet (75 mg total) by mouth daily., Disp: 90 tablet, Rfl: 3 .  fentaNYL (DURAGESIC - DOSED MCG/HR) 100  MCG/HR, Place 1 patch (100 mcg total) onto the skin every 3 (three) days., Disp: 10 patch, Rfl: 0 .  lisinopril (PRINIVIL,ZESTRIL) 2.5 MG tablet, Take 1 tablet (2.5 mg total) by mouth daily., Disp: 30 tablet, Rfl: 0 .  metoprolol tartrate (LOPRESSOR) 25 MG tablet, Take 1 tablet (25 mg total) by mouth 2 (two) times daily., Disp: 60 tablet, Rfl: 1 .   nitroGLYCERIN (NITROSTAT) 0.4 MG SL tablet, Place 0.4 mg under the tongue every 5 (five) minutes as needed. , Disp: , Rfl:  .  Oxycodone HCl 10 MG TABS, One pill every 8 hours as needed for pain, Disp: 90 tablet, Rfl: 0 .  pantoprazole (PROTONIX) 40 MG tablet, Take 1 tablet (40 mg total) by mouth daily at 6 (six) AM., Disp: 30 tablet, Rfl: 0 .  polyethylene glycol (MIRALAX / GLYCOLAX) packet, Take 17 g by mouth daily as needed for moderate constipation., Disp: , Rfl:  .  Wheat Dextrin (BENEFIBER) POWD, Stir 2 tsp. TID into 4-8 oz of any non-carbonated beverage or soft food (hot or cold), Disp: 500 g, Rfl: PRN .  predniSONE (DELTASONE) 5 MG tablet, Take 5 mg by mouth 2 (two) times daily with a meal., Disp: , Rfl:  .  predniSONE (DELTASONE) 5 MG tablet, TAKE 1 TABLET BY MOUTH 2 TIMES DAILY WITH A MEAL. TAKE DAILY WITH ZYTIGA (Patient not taking: Reported on 08/16/2016), Disp: 60 tablet, Rfl: 6  ------------------------------------------------------------------------- Social History  Substance Use Topics  . Smoking status: Current Every Day Smoker    Packs/day: 1.00    Years: 40.00    Types: Cigarettes  . Smokeless tobacco: Current User  . Alcohol use No     Comment: occasionally    Review of Systems Per HPI unless specifically indicated above     Objective:    BP 132/68   Pulse (!) 55   Temp 98.2 F (36.8 C) (Oral)   Resp 16   Ht 5\' 10"  (1.778 m)   Wt 184 lb 9.6 oz (83.7 kg)   SpO2 100%   BMI 26.49 kg/m   Wt Readings from Last 3 Encounters:  08/16/16 184 lb 9.6 oz (83.7 kg)  08/10/16 186 lb (84.4 kg)  08/02/16 181 lb (82.1 kg)    Physical Exam  Constitutional: He is oriented to person, place, and time. He appears well-developed and well-nourished. No distress.  Well-appearing, comfortable, cooperative  HENT:  Head: Normocephalic and atraumatic.  Mouth/Throat: Oropharynx is clear and moist.  Frontal / maxillary sinuses non-tender. Nares patent without purulence or edema.  Bilateral TMs clear without erythema, effusion or bulging. Oropharynx clear without erythema, exudates, edema or asymmetry.  Eyes: Conjunctivae are normal. Right eye exhibits no discharge. Left eye exhibits no discharge.  Neck: Normal range of motion. Neck supple. No thyromegaly present.  Cardiovascular: Normal rate, regular rhythm, normal heart sounds and intact distal pulses.   No murmur heard. Pulmonary/Chest: Effort normal. No respiratory distress. He has no rales.  Mild coarse breath sounds with some rhonchi L>R lower bases, improve with cough. Some scattered exp wheezes. Speaks full sentences.  Musculoskeletal: Normal range of motion. He exhibits no edema.  Lymphadenopathy:    He has no cervical adenopathy.  Neurological: He is alert and oriented to person, place, and time.  Skin: Skin is warm and dry. No rash noted. He is not diaphoretic. No erythema.  Psychiatric: He has a normal mood and affect. His behavior is normal.  Well groomed, good eye contact, normal speech and thoughts  Nursing note and vitals reviewed.    Results for orders placed or performed during the hospital encounter of 01/60/10  Basic metabolic panel  Result Value Ref Range   Sodium 140 135 - 145 mmol/L   Potassium 3.5 3.5 - 5.1 mmol/L   Chloride 107 101 - 111 mmol/L   CO2 26 22 - 32 mmol/L   Glucose, Bld 123 (H) 65 - 99 mg/dL   BUN 17 6 - 20 mg/dL   Creatinine, Ser 0.79 0.61 - 1.24 mg/dL   Calcium 9.0 8.9 - 10.3 mg/dL   GFR calc non Af Amer >60 >60 mL/min   GFR calc Af Amer >60 >60 mL/min   Anion gap 7 5 - 15  CBC  Result Value Ref Range   WBC 4.5 3.8 - 10.6 K/uL   RBC 4.05 (L) 4.40 - 5.90 MIL/uL   Hemoglobin 12.9 (L) 13.0 - 18.0 g/dL   HCT 37.9 (L) 40.0 - 52.0 %   MCV 93.6 80.0 - 100.0 fL   MCH 31.9 26.0 - 34.0 pg   MCHC 34.1 32.0 - 36.0 g/dL   RDW 15.6 (H) 11.5 - 14.5 %   Platelets 134 (L) 150 - 440 K/uL  Troponin I  Result Value Ref Range   Troponin I <0.03 <0.03 ng/mL  Protime-INR  Result  Value Ref Range   Prothrombin Time 12.8 11.4 - 15.2 seconds   INR 0.96   APTT  Result Value Ref Range   aPTT 35 24 - 36 seconds  Heparin level (unfractionated)  Result Value Ref Range   Heparin Unfractionated 0.41 0.30 - 0.70 IU/mL  Basic metabolic panel  Result Value Ref Range   Sodium 141 135 - 145 mmol/L   Potassium 3.5 3.5 - 5.1 mmol/L   Chloride 109 101 - 111 mmol/L   CO2 27 22 - 32 mmol/L   Glucose, Bld 100 (H) 65 - 99 mg/dL   BUN 18 6 - 20 mg/dL   Creatinine, Ser 0.71 0.61 - 1.24 mg/dL   Calcium 8.8 (L) 8.9 - 10.3 mg/dL   GFR calc non Af Amer >60 >60 mL/min   GFR calc Af Amer >60 >60 mL/min   Anion gap 5 5 - 15  CBC  Result Value Ref Range   WBC 4.1 3.8 - 10.6 K/uL   RBC 3.83 (L) 4.40 - 5.90 MIL/uL   Hemoglobin 12.4 (L) 13.0 - 18.0 g/dL   HCT 36.4 (L) 40.0 - 52.0 %   MCV 94.9 80.0 - 100.0 fL   MCH 32.4 26.0 - 34.0 pg   MCHC 34.2 32.0 - 36.0 g/dL   RDW 15.5 (H) 11.5 - 14.5 %   Platelets 126 (L) 150 - 440 K/uL  Troponin I  Result Value Ref Range   Troponin I <0.03 <0.03 ng/mL  Troponin I  Result Value Ref Range   Troponin I <0.03 <0.03 ng/mL  Troponin I  Result Value Ref Range   Troponin I <0.03 <0.03 ng/mL      Assessment & Plan:   Problem List Items Addressed This Visit    None    Visit Diagnoses    Atypical chest pain    -  Primary  Resolved, acute on chronic problem. S/p CP rule out hospitalization without evidence of MI or ischemia. - Follow-up as scheduled with Cardiology in 2 days, recommend to discuss new meds from admission Metoprolol 25mg  BID and Lisinopril 2.5mg  daily - previously had to DC metoprolol due to fatigue in past and bradycardia,  HR today mild low 55s, asymptomatic, concern may need dose adjust or not tolerate, will defer to Cardiology for now    Acute bronchitis, unspecified organism      Resolving. S/p antibiotic z-pak and prednisone burst among symptomatic control meds - Follow-up return criteria given if worsening, discussion on  consider CXR today, but afebrile and suspect still resolving would recommend instead 3-4 weeks for CXR if any residual or lingering symptoms, otherwise sooner eval if worsening - Future may consider possible COPD, could do PFTs and consider maintenance inhaler        No orders of the defined types were placed in this encounter.   Follow up plan: Return in about 2 weeks (around 08/30/2016), or if symptoms worsen or fail to improve, for bronchitis.  Nobie Putnam, [facility] 08/16/2016, 6:34 PM

## 2016-08-16 NOTE — Patient Instructions (Addendum)
Thank you for coming to the clinic today.  1. When you follow-up with Cardiology this week, discuss the recent med changes back on Metoprolol 25mg  BID and Lisinopril 2.5mg , I am concerned some of your fatigue may be from Metoprolol and possibly from low heart rate HR 55  If you have any significant chest pain that does not go away within 30 minutes, is accompanied by nausea, sweating, shortness of breath, or made worse by activity, this may be evidence of a heart attack, especially if symptoms worsening instead of improving, please call 911 or go directly to the emergency room immediately for evaluation.  Please schedule a Follow-up Appointment to: Return in about 2 weeks (around 08/30/2016), or if symptoms worsen or fail to improve, for bronchitis.  If you have any other questions or concerns, please feel free to call the clinic or send a message through [facility]. You may also schedule an earlier appointment if necessary.  Additionally, you may be receiving a survey about your experience at our clinic within a few days to 1 week by e-mail or mail. We value your feedback.  Nobie Putnam, DO [place]

## 2016-08-17 ENCOUNTER — Inpatient Hospital Stay: Payer: 59 | Attending: Internal Medicine

## 2016-08-17 DIAGNOSIS — I2510 Atherosclerotic heart disease of native coronary artery without angina pectoris: Secondary | ICD-10-CM | POA: Diagnosis not present

## 2016-08-17 DIAGNOSIS — E785 Hyperlipidemia, unspecified: Secondary | ICD-10-CM | POA: Insufficient documentation

## 2016-08-17 DIAGNOSIS — I10 Essential (primary) hypertension: Secondary | ICD-10-CM | POA: Insufficient documentation

## 2016-08-17 DIAGNOSIS — Z79899 Other long term (current) drug therapy: Secondary | ICD-10-CM | POA: Insufficient documentation

## 2016-08-17 DIAGNOSIS — Z79818 Long term (current) use of other agents affecting estrogen receptors and estrogen levels: Secondary | ICD-10-CM | POA: Diagnosis not present

## 2016-08-17 DIAGNOSIS — F17210 Nicotine dependence, cigarettes, uncomplicated: Secondary | ICD-10-CM | POA: Insufficient documentation

## 2016-08-17 DIAGNOSIS — I252 Old myocardial infarction: Secondary | ICD-10-CM | POA: Diagnosis not present

## 2016-08-17 DIAGNOSIS — C7951 Secondary malignant neoplasm of bone: Secondary | ICD-10-CM | POA: Insufficient documentation

## 2016-08-17 DIAGNOSIS — C61 Malignant neoplasm of prostate: Secondary | ICD-10-CM | POA: Insufficient documentation

## 2016-08-17 LAB — CBC WITH DIFFERENTIAL/PLATELET
Basophils Absolute: 0 10*3/uL (ref 0–0.1)
Basophils Relative: 1 %
EOS ABS: 0.1 10*3/uL (ref 0–0.7)
EOS PCT: 1 %
HCT: 36.2 % — ABNORMAL LOW (ref 40.0–52.0)
Hemoglobin: 12.1 g/dL — ABNORMAL LOW (ref 13.0–18.0)
Lymphocytes Relative: 13 %
Lymphs Abs: 1.1 10*3/uL (ref 1.0–3.6)
MCH: 31.4 pg (ref 26.0–34.0)
MCHC: 33.5 g/dL (ref 32.0–36.0)
MCV: 93.8 fL (ref 80.0–100.0)
MONOS PCT: 8 %
Monocytes Absolute: 0.7 10*3/uL (ref 0.2–1.0)
Neutro Abs: 6.2 10*3/uL (ref 1.4–6.5)
Neutrophils Relative %: 77 %
PLATELETS: 240 10*3/uL (ref 150–440)
RBC: 3.85 MIL/uL — AB (ref 4.40–5.90)
RDW: 15.1 % — ABNORMAL HIGH (ref 11.5–14.5)
WBC: 8 10*3/uL (ref 3.8–10.6)

## 2016-08-17 LAB — COMPREHENSIVE METABOLIC PANEL
ALT: 13 U/L — ABNORMAL LOW (ref 17–63)
ANION GAP: 4 — AB (ref 5–15)
AST: 14 U/L — ABNORMAL LOW (ref 15–41)
Albumin: 3.7 g/dL (ref 3.5–5.0)
Alkaline Phosphatase: 52 U/L (ref 38–126)
BUN: 15 mg/dL (ref 6–20)
CHLORIDE: 103 mmol/L (ref 101–111)
CO2: 29 mmol/L (ref 22–32)
Calcium: 8.3 mg/dL — ABNORMAL LOW (ref 8.9–10.3)
Creatinine, Ser: 0.68 mg/dL (ref 0.61–1.24)
GFR calc non Af Amer: >60 mL/min (ref >60)
Glucose, Bld: 116 mg/dL — ABNORMAL HIGH (ref 65–99)
POTASSIUM: 4 mmol/L (ref 3.5–5.1)
SODIUM: 136 mmol/L (ref 135–145)
Total Bilirubin: 0.6 mg/dL (ref 0.3–1.2)
Total Protein: 6.3 g/dL — ABNORMAL LOW (ref 6.5–8.1)

## 2016-08-17 LAB — PSA: Prostatic Specific Antigen: 3.15 ng/mL (ref 0.00–4.00)
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
## 2016-08-18 ENCOUNTER — Encounter: Payer: Self-pay | Admitting: Cardiovascular Disease

## 2016-08-18 ENCOUNTER — Ambulatory Visit (INDEPENDENT_AMBULATORY_CARE_PROVIDER_SITE_OTHER): Payer: 59 | Admitting: Cardiovascular Disease

## 2016-08-18 VITALS — BP 140/78 | HR 54 | Ht 70.0 in | Wt 184.2 lb

## 2016-08-18 DIAGNOSIS — I10 Essential (primary) hypertension: Secondary | ICD-10-CM

## 2016-08-18 DIAGNOSIS — E782 Mixed hyperlipidemia: Secondary | ICD-10-CM | POA: Diagnosis not present

## 2016-08-18 DIAGNOSIS — F17200 Nicotine dependence, unspecified, uncomplicated: Secondary | ICD-10-CM

## 2016-08-18 DIAGNOSIS — I208 Other forms of angina pectoris: Secondary | ICD-10-CM | POA: Diagnosis not present

## 2016-08-18 DIAGNOSIS — I209 Angina pectoris, unspecified: Secondary | ICD-10-CM

## 2016-08-18 DIAGNOSIS — I25118 Atherosclerotic heart disease of native coronary artery with other forms of angina pectoris: Secondary | ICD-10-CM

## 2016-08-18 DIAGNOSIS — I200 Unstable angina: Secondary | ICD-10-CM

## 2016-08-18 DIAGNOSIS — R079 Chest pain, unspecified: Secondary | ICD-10-CM | POA: Diagnosis not present

## 2016-08-18 NOTE — Patient Instructions (Addendum)
Medication Instructions:   Please cut the metoprolol in 1/2 twice a day  Labwork:  No new labs needed  Testing/Procedures:  No further testing at this time   Follow-Up: It was a pleasure seeing you in the office today. Please call us if you have new issues that need to be addressed before your next appt.  [phone]  Your physician wants you to follow-up in: 12 months.  You will receive a reminder letter in the mail two months in advance. If you don't receive a letter, please call our office to schedule the follow-up appointment.  If you need a refill on your cardiac medications before your next appointment, please call your pharmacy.     

## 2016-08-22 ENCOUNTER — Inpatient Hospital Stay: Payer: 59

## 2016-08-22 ENCOUNTER — Inpatient Hospital Stay (HOSPITAL_BASED_OUTPATIENT_CLINIC_OR_DEPARTMENT_OTHER): Payer: 59 | Admitting: Internal Medicine

## 2016-08-22 VITALS — BP 178/91 | HR 63 | Temp 97.6°F | Resp 20 | Ht 70.0 in | Wt 183.9 lb

## 2016-08-22 DIAGNOSIS — I2510 Atherosclerotic heart disease of native coronary artery without angina pectoris: Secondary | ICD-10-CM | POA: Diagnosis not present

## 2016-08-22 DIAGNOSIS — F17210 Nicotine dependence, cigarettes, uncomplicated: Secondary | ICD-10-CM | POA: Diagnosis not present

## 2016-08-22 DIAGNOSIS — Z79818 Long term (current) use of other agents affecting estrogen receptors and estrogen levels: Secondary | ICD-10-CM

## 2016-08-22 DIAGNOSIS — C61 Malignant neoplasm of prostate: Secondary | ICD-10-CM | POA: Diagnosis not present

## 2016-08-22 DIAGNOSIS — I10 Essential (primary) hypertension: Secondary | ICD-10-CM | POA: Diagnosis not present

## 2016-08-22 DIAGNOSIS — I252 Old myocardial infarction: Secondary | ICD-10-CM | POA: Diagnosis not present

## 2016-08-22 DIAGNOSIS — C7951 Secondary malignant neoplasm of bone: Secondary | ICD-10-CM

## 2016-08-22 DIAGNOSIS — Z79899 Other long term (current) drug therapy: Secondary | ICD-10-CM | POA: Diagnosis not present

## 2016-08-22 DIAGNOSIS — E785 Hyperlipidemia, unspecified: Secondary | ICD-10-CM | POA: Diagnosis not present

## 2016-08-22 DIAGNOSIS — G893 Neoplasm related pain (acute) (chronic): Secondary | ICD-10-CM

## 2016-08-22 MED ORDER — DENOSUMAB 120 MG/1.7ML ~~LOC~~ SOLN
120.0000 mg | Freq: Once | SUBCUTANEOUS | Status: AC
  Administered 2016-08-22: 120 mg via SUBCUTANEOUS

## 2016-08-22 MED ORDER — OXYCODONE HCL 10 MG PO TABS: ORAL_TABLET | ORAL | 0 refills | Status: DC

## 2016-08-22 MED ORDER — LEUPROLIDE ACETATE (3 MONTH) 22.5 MG IM KIT
22.5000 mg | PACK | Freq: Once | INTRAMUSCULAR | Status: AC
  Administered 2016-08-22: 22.5 mg via INTRAMUSCULAR
  Filled 2016-08-22: qty 22.5

## 2016-08-22 MED ORDER — FENTANYL 100 MCG/HR TD PT72: 100.0000 ug | MEDICATED_PATCH | TRANSDERMAL | 0 refills | Status: DC

## 2016-08-22 NOTE — Progress Notes (Signed)
Patient states he was diagnosed with pneumonia 2 weeks ago. He stopped the Zytiga for 1 week. He is currently taking 500 mg per day in am. Patient unable to tolerate 4 tablets. He states that Dr. B is aware of this.

## 2016-08-22 NOTE — Patient Instructions (Signed)
Leuprolide depot injection What is this medicine? LEUPROLIDE (loo PROE lide) is a man-made protein that acts like a natural hormone in the body. It decreases testosterone in men and decreases estrogen in women. In men, this medicine is used to treat advanced prostate cancer. In women, some forms of this medicine may be used to treat endometriosis, uterine fibroids, or other male hormone-related problems. This medicine may be used for other purposes; ask your health care provider or pharmacist if you have questions. COMMON BRAND NAME(S): Eligard, Lupron Depot, Lupron Depot-Ped, Viadur What should I tell my health care provider before I take this medicine? They need to know if you have any of these conditions: -diabetes -heart disease or previous heart attack -high blood pressure -high cholesterol -mental illness -osteoporosis -pain or difficulty passing urine -seizures -spinal cord metastasis -stroke -suicidal thoughts, plans, or attempt; a previous suicide attempt by you or a family member -tobacco smoker -unusual vaginal bleeding (women) -an unusual or allergic reaction to leuprolide, benzyl alcohol, other medicines, foods, dyes, or preservatives -pregnant or trying to get pregnant -breast-feeding How should I use this medicine? This medicine is for injection into a muscle or for injection under the skin. It is given by a health care professional in a hospital or clinic setting. The specific product will determine how it will be given to you. Make sure you understand which product you receive and how often you will receive it. Talk to your pediatrician regarding the use of this medicine in children. Special care may be needed. Overdosage: If you think you have taken too much of this medicine contact a poison control center or emergency room at once. NOTE: This medicine is only for you. Do not share this medicine with others. What if I miss a dose? It is important not to miss a dose.  Call your doctor or health care professional if you are unable to keep an appointment. Depot injections: Depot injections are given either once-monthly, every 12 weeks, every 16 weeks, or every 24 weeks depending on the product you are prescribed. The product you are prescribed will be based on if you are male or male, and your condition. Make sure you understand your product and dosing. What may interact with this medicine? Do not take this medicine with any of the following medications: -chasteberry This medicine may also interact with the following medications: -herbal or dietary supplements, like black cohosh or DHEA -male hormones, like estrogens or progestins and birth control pills, patches, rings, or injections -male hormones, like testosterone This list may not describe all possible interactions. Give your health care provider a list of all the medicines, herbs, non-prescription drugs, or dietary supplements you use. Also tell them if you smoke, drink alcohol, or use illegal drugs. Some items may interact with your medicine. What should I watch for while using this medicine? Visit your doctor or health care professional for regular checks on your progress. During the first weeks of treatment, your symptoms may get worse, but then will improve as you continue your treatment. You may get hot flashes, increased bone pain, increased difficulty passing urine, or an aggravation of nerve symptoms. Discuss these effects with your doctor or health care professional, some of them may improve with continued use of this medicine. Male patients may experience a menstrual cycle or spotting during the first months of therapy with this medicine. If this continues, contact your doctor or health care professional. What side effects may I notice from receiving this medicine? Side   effects that you should report to your doctor or health care professional as soon as possible: -allergic reactions like skin  rash, itching or hives, swelling of the face, lips, or tongue -breathing problems -chest pain -depression or memory disorders -pain in your legs or groin -pain at site where injected or implanted -seizures -severe headache -swelling of the feet and legs -suicidal thoughts or other mood changes -visual changes -vomiting Side effects that usually do not require medical attention (report to your doctor or health care professional if they continue or are bothersome): -breast swelling or tenderness -decrease in sex drive or performance -diarrhea -hot flashes -loss of appetite -muscle, joint, or bone pains -nausea -redness or irritation at site where injected or implanted -skin problems or acne This list may not describe all possible side effects. Call your doctor for medical advice about side effects. You may report side effects to FDA at 1-800-FDA-1088. Where should I keep my medicine? This drug is given in a hospital or clinic and will not be stored at home. NOTE: This sheet is a summary. It may not cover all possible information. If you have questions about this medicine, talk to your doctor, pharmacist, or health care provider.  2018 Elsevier/Gold Standard (2015-06-03 09:45:53) Capecitabine tablets What is this medicine? CAPECITABINE (ka pe SITE a been) is a chemotherapy drug. It slows the growth of cancer cells. This medicine is used to treat breast cancer, and also colon or rectal cancer. This medicine may be used for other purposes; ask your health care provider or pharmacist if you have questions. COMMON BRAND NAME(S): Xeloda What should I tell my health care provider before I take this medicine? They need to know if you have any of these conditions: -bleeding or blood disorders -dihydropyrimidine dehydrogenase (DPD) deficiency -heart disease -infection (especially a virus infection such as chickenpox, cold sores, or herpes) -kidney disease -liver disease -an unusual or  allergic reaction to capecitabine, 5-fluorouracil, other medicines, foods, dyes, or preservatives -pregnant or trying to get pregnant -breast-feeding How should I use this medicine? Take this medicine by mouth with a glass of water, within 30 minutes of the end of a meal. Do not cut, crush or chew this medicine. Follow the directions on the prescription label. Take your medicine at regular intervals. Do not take it more often than directed. Do not stop taking except on your doctor's advice. Your doctor may want you to take a combination of 150 mg and 500 mg tablets for each dose. It is very important that you know how to correctly take your dose. Taking the wrong tablets could result in an overdose (too much medication) or underdose (too little medication). Talk to your pediatrician regarding the use of this medicine in children. Special care may be needed. Overdosage: If you think you have taken too much of this medicine contact a poison control center or emergency room at once. NOTE: This medicine is only for you. Do not share this medicine with others. What if I miss a dose? If you miss a dose, do not take the missed dose at all. Do not take double or extra doses. Instead, continue with your next scheduled dose and check with your doctor. What may interact with this medicine? -antacids with aluminum and/or magnesium -folic acid -leucovorin -medicines to increase blood counts like filgrastim, pegfilgrastim, sargramostim -phenytoin -vaccines -warfarin Talk to your doctor or health care professional before taking any of these medicines: -acetaminophen -aspirin -ibuprofen -ketoprofen -naproxen This list may not describe all possible  interactions. Give your health care provider a list of all the medicines, herbs, non-prescription drugs, or dietary supplements you use. Also tell them if you smoke, drink alcohol, or use illegal drugs. Some items may interact with your medicine. What should I  watch for while using this medicine? Visit your doctor for checks on your progress. This drug may make you feel generally unwell. This is not uncommon, as chemotherapy can affect healthy cells as well as cancer cells. Report any side effects. Continue your course of treatment even though you feel ill unless your doctor tells you to stop. In some cases, you may be given additional medicines to help with side effects. Follow all directions for their use. Call your doctor or health care professional for advice if you get a fever, chills or sore throat, or other symptoms of a cold or flu. Do not treat yourself. This drug decreases your body's ability to fight infections. Try to avoid being around people who are sick. This medicine may increase your risk to bruise or bleed. Call your doctor or health care professional if you notice any unusual bleeding. Be careful brushing and flossing your teeth or using a toothpick because you may get an infection or bleed more easily. If you have any dental work done, tell your dentist you are receiving this medicine. Avoid taking products that contain aspirin, acetaminophen, ibuprofen, naproxen, or ketoprofen unless instructed by your doctor. These medicines may hide a fever. Do not become pregnant while taking this medicine or for 6 months after stopping it. Women should inform their doctor if they wish to become pregnant or think they might be pregnant. There is a potential for serious side effects to an unborn child. Talk to your health care professional or pharmacist for more information. Do not breast-feed an infant while taking this medicine or for 2 weeks after stopping it. Men are advised not to father a child while taking this medicine or for 3 months after stopping it. This medicine may make it more difficult to get pregnant or father a child. Talk with your doctor or health care professional if you are concerned about your fertility. What side effects may I  notice from receiving this medicine? Side effects that you should report to your doctor or health care professional as soon as possible: -allergic reactions like skin rash, itching or hives, swelling of the face, lips, or tongue -low blood counts - this medicine may decrease the number of white blood cells, red blood cells and platelets. You may be at increased risk for infections and bleeding. -signs of infection - fever or chills, cough, sore throat, pain or difficulty passing urine -signs of decreased platelets or bleeding - bruising, pinpoint red spots on the skin, black, tarry stools, blood in the urine -signs of decreased red blood cells - unusually weak or tired, fainting spells, lightheadedness -breathing problems -changes in vision -chest pain -dark urine -diarrhea of more than 4 bowel movements in one day or any diarrhea at night; bloody or watery diarrhea -dizziness -mouth sores -nausea and vomiting -pain, tingling, numbness in the hands or feet -redness, swelling, or sores on hands or feet -stomach pain -vomiting -yellow color of skin or eyes Side effects that usually do not require medical attention (report to your doctor or health care professional if they continue or are bothersome): -constipation -diarrhea -dry or itchy skin -hair loss -loss of appetite -nausea -weak or tired This list may not describe all possible side effects. Call your doctor  for medical advice about side effects. You may report side effects to FDA at 1-800-FDA-1088. Where should I keep my medicine? Keep out of the reach of children. Store at room temperature between 15 and 30 degrees C (59 and 86 degrees F). Keep container tightly closed. Throw away any unused medicine after the expiration date. NOTE: This sheet is a summary. It may not cover all possible information. If you have questions about this medicine, talk to your doctor, pharmacist, or health care provider.  2018 Elsevier/Gold Standard  (2015-01-06 13:11:21)

## 2016-08-22 NOTE — Progress Notes (Signed)
[facility] OFFICE PROGRESS NOTE  Patient Care Team: Olin Hauser, DO as PCP - General (Family Medicine) Minna Merritts, MD as Consulting Physician (Cardiology)   SUMMARY OF ONCOLOGIC HISTORY: Oncology History   # 2011- PROSTATE CANCER [Gleason 3+4]; s/p Prostatectomy [ also involved bladder neck/ECP; Dr.Polaseck]; July 2014- Biochemical recurrence [PSA 14]- started on Zoladex [Dr.Pandit]; lost to follow up.  # JAN 2017- STAGE IV METASTATIC PROSTATE Cancer to Bone- Feb 13th, 2017-  Lupron q 93m [~end of feb]; PSA: 1021; Declined Chemo; April 2017 [xofigo x6; Dr.Crystal]; AUG 2017- Zytiga + Prednisone BID. Bone scan-Jan 2018- improved skeletal metastases.   # Mets to bone- start X-geva q 43M [May 30th]  # Smoker/ chronic pain/pain clinic      Prostate cancer metastatic to bone ([place])   12/08/2014 Initial Diagnosis    Prostate cancer metastatic to bone [facility])       INTERVAL HISTORY:   66 year old pleasant Caucasian male patient  With above history of prostate cancer metastatic castrate resistant currently on Lupron + Zytiga is here for follow-up.   Patient was recently admitted to the hospital for "pneumonia "- treated with antibiotics. Symptoms improved. He continues to be on Zytiga plus prednisone. He is  currently taking only 2 pillsof Zytiga day.  His rib pain/ back pain is stable. Otherwise no swelling of the legs. No nausea no vomiting. He denies any jaw pain.  REVIEW OF SYSTEMS:  A complete 10 point review of system is done which is negative except mentioned above/history of present illness.   PAST MEDICAL HISTORY :  Past Medical History:  Diagnosis Date  . Back pain 10/09/2012  . Bone cancer ([place])   . CAD (coronary artery disease)    a. 08/2015 PCI: LM nl, LAD 20d, LCX nl, RCA 52m, RPAV 95 (3.0x18 Xience Alpine DES);  b. 04/2016 PCI [facility]): RPL 95 (2.75x8 Promus Premier DES).  . Cancer associated pain   . Depression   . History of  echocardiogram    a. 08/2016 Echo: EF 50-55%.  Marland Kitchen History of kidney stones   . Hyperlipidemia   . Hypertension   . Joint pain   . Prostate cancer [facility])    a.  s/p prostatectomy ([facility]);  b. bone mets noted 04/2016.  . Right arm pain 01/10/2016  . Right foot pain 01/10/2016  . Right leg pain 01/10/2016  . Tobacco abuse     PAST SURGICAL HISTORY :   Past Surgical History:  Procedure Laterality Date  . CARDIAC CATHETERIZATION     [facility]  . CARDIAC CATHETERIZATION N/A 08/16/2015   Procedure: Left Heart Cath and Coronary Angiography;  Surgeon: Yolonda Kida, MD;  Location: [facility];  Service: Cardiovascular;  Laterality: N/A;  . CARDIAC CATHETERIZATION N/A 08/16/2015   Procedure: Coronary Stent Intervention;  Surgeon: Yolonda Kida, MD;  Location: [facility];  Service: Cardiovascular;  Laterality: N/A;  . CERVIX SURGERY    . PROSTATECTOMY    . SPINE SURGERY    . TONSILLECTOMY    . WRIST SURGERY      FAMILY HISTORY :   Family History  Problem Relation Age of Onset  . Heart attack Mother   . Hypertension Mother   . Heart attack Father     SOCIAL HISTORY:   Social History  Substance Use Topics  . Smoking status: Current Every Day Smoker    Packs/day: 1.00    Years: 40.00    Types: Cigarettes  .  Smokeless tobacco: Current User  . Alcohol use No     Comment: occasionally    ALLERGIES:  has No Known Allergies.  MEDICATIONS:  Current Outpatient Prescriptions  Medication Sig Dispense Refill  . abiraterone Acetate (ZYTIGA) 250 MG tablet Take 4 tablets (1,000 mg total) by mouth daily. Take on an empty stomach 1 hour before or 2 hours after a meal (Patient taking differently: Take 500 mg by mouth daily. Take on an empty stomach 1 hour before or 2 hours after a meal) 120 tablet 4  . acetaminophen (TYLENOL) 500 MG tablet Take 1,000 mg by mouth every 8 (eight) hours as needed.     Marland Kitchen albuterol (PROVENTIL HFA;VENTOLIN HFA) 108 (90 Base) MCG/ACT inhaler Inhale  1-2 puffs into the lungs every 6 (six) hours as needed for wheezing or shortness of breath. 1 Inhaler 0  . aspirin 81 MG tablet Take 81 mg by mouth daily.    Marland Kitchen atorvastatin (LIPITOR) 80 MG tablet Take 1 tablet (80 mg total) by mouth daily at 6 PM. 90 tablet 3  . clopidogrel (PLAVIX) 75 MG tablet Take 1 tablet (75 mg total) by mouth daily. 90 tablet 3  . fentaNYL (DURAGESIC - DOSED MCG/HR) 100 MCG/HR Place 1 patch (100 mcg total) onto the skin every 3 (three) days. 10 patch 0  . lisinopril (PRINIVIL,ZESTRIL) 2.5 MG tablet Take 1 tablet (2.5 mg total) by mouth daily. 30 tablet 0  . metoprolol tartrate (LOPRESSOR) 25 MG tablet Take 1 tablet (25 mg total) by mouth 2 (two) times daily. (Patient taking differently: Take 12.5 mg by mouth 2 (two) times daily. ) 60 tablet 1  . Oxycodone HCl 10 MG TABS One pill every 8 hours as needed for pain 90 tablet 0  . pantoprazole (PROTONIX) 40 MG tablet Take 1 tablet (40 mg total) by mouth daily at 6 (six) AM. 30 tablet 0  . predniSONE (DELTASONE) 5 MG tablet TAKE 1 TABLET BY MOUTH 2 TIMES DAILY WITH A MEAL. TAKE DAILY WITH ZYTIGA 60 tablet 6  . Wheat Dextrin (BENEFIBER) POWD Stir 2 tsp. TID into 4-8 oz of any non-carbonated beverage or soft food (hot or cold) 500 g PRN  . nitroGLYCERIN (NITROSTAT) 0.4 MG SL tablet Place 0.4 mg under the tongue every 5 (five) minutes as needed.     . polyethylene glycol (MIRALAX / GLYCOLAX) packet Take 17 g by mouth daily as needed for moderate constipation.     No current facility-administered medications for this visit.     PHYSICAL EXAMINATION: ECOG PERFORMANCE STATUS: 1 - Symptomatic but completely ambulatory  BP (!) 178/91 (Patient Position: Sitting)   Pulse 63   Temp 97.6 F (36.4 C) (Tympanic)   Resp 20   Ht 5\' 10"  (1.778 m)   Wt 183 lb 13.8 oz (83.4 kg)   BMI 26.38 kg/m   Filed Weights   08/22/16 1129  Weight: 183 lb 13.8 oz (83.4 kg)   GENERAL: Well-nourished well-developed; Alert, no distress and  comfortable.He is alone. Walks with a cane. EYES: no pallor or icterus OROPHARYNX: no thrush or ulceration; Upper dentures. NECK: supple, no masses felt LYMPH: no palpable lymphadenopathy in the cervical, axillary or inguinal regions LUNGS: clear to auscultation and No wheeze or crackles HEART/CVS: regular rate & rhythm and no murmurs; No lower extremity edema ABDOMEN:abdomen soft, non-tender and normal bowel sounds; No hepatomegaly. Musculoskeletal:no cyanosis of digits and no clubbing; No significant tenderness noted. PSYCH: alert & oriented x 3 with fluent speech NEURO: no focal  motor/sensory deficits SKIN: no rashes or significant lesions   LABORATORY DATA:  I have reviewed the data as listed    Component Value Date/Time   NA 136 08/17/2016 1103   NA 140 07/13/2013 1542   K 4.0 08/17/2016 1103   K 4.3 07/13/2013 1542   CL 103 08/17/2016 1103   CL 107 07/13/2013 1542   CO2 29 08/17/2016 1103   CO2 26 07/13/2013 1542   GLUCOSE 116 (H) 08/17/2016 1103   GLUCOSE 106 (H) 07/13/2013 1542   BUN 15 08/17/2016 1103   BUN 11 07/13/2013 1542   CREATININE 0.68 08/17/2016 1103   CREATININE 1.01 07/13/2013 1542   CALCIUM 8.3 (L) 08/17/2016 1103   CALCIUM 9.9 07/13/2013 1542   PROT 6.3 (L) 08/17/2016 1103   PROT 8.1 07/13/2013 1542   ALBUMIN 3.7 08/17/2016 1103   ALBUMIN 4.3 07/13/2013 1542   AST 14 (L) 08/17/2016 1103   AST 26 07/13/2013 1542   ALT 13 (L) 08/17/2016 1103   ALT 32 07/13/2013 1542   ALKPHOS 52 08/17/2016 1103   ALKPHOS 74 07/13/2013 1542   BILITOT 0.6 08/17/2016 1103   BILITOT 0.4 07/13/2013 1542   GFRNONAA >60 08/17/2016 1103   GFRNONAA >60 07/13/2013 1542   GFRAA >60 08/17/2016 1103   GFRAA >60 07/13/2013 1542    No results found for: SPEP, UPEP  Lab Results  Component Value Date   WBC 8.0 08/17/2016   NEUTROABS 6.2 08/17/2016   HGB 12.1 (L) 08/17/2016   HCT 36.2 (L) 08/17/2016   MCV 93.8 08/17/2016   PLT 240 08/17/2016      Chemistry       Component Value Date/Time   NA 136 08/17/2016 1103   NA 140 07/13/2013 1542   K 4.0 08/17/2016 1103   K 4.3 07/13/2013 1542   CL 103 08/17/2016 1103   CL 107 07/13/2013 1542   CO2 29 08/17/2016 1103   CO2 26 07/13/2013 1542   BUN 15 08/17/2016 1103   BUN 11 07/13/2013 1542   CREATININE 0.68 08/17/2016 1103   CREATININE 1.01 07/13/2013 1542      Component Value Date/Time   CALCIUM 8.3 (L) 08/17/2016 1103   CALCIUM 9.9 07/13/2013 1542   ALKPHOS 52 08/17/2016 1103   ALKPHOS 74 07/13/2013 1542   AST 14 (L) 08/17/2016 1103   AST 26 07/13/2013 1542   ALT 13 (L) 08/17/2016 1103   ALT 32 07/13/2013 1542   BILITOT 0.6 08/17/2016 1103   BILITOT 0.4 07/13/2013 1542     Results for Curro, Kirby L (MRN [id number]) as of 07/11/2016 11:43  Ref. Range 07/15/2012 13:47 08/15/2012 10:21 12/05/2012 10:07 02/08/2015 09:52 04/27/2015 11:49 06/01/2015 11:35 07/14/2015 13:25 08/10/2015 14:03 10/05/2015 13:44 11/18/2015 13:35 01/04/2016 13:25 02/03/2016 13:51 03/06/2016 14:53 04/14/2016 10:40 05/26/2016 13:49 07/06/2016 15:20  PSA Latest Ref Range: 0.00 - 4.00 ng/mL 14.2 (H) 14.6 (H) 0.2 1,021.00 (H) 33.39 (H) 43.62 (H) 14.08 (H) 15.26 (H) 1.40 3.03 6.79 (H) 10.47 (H) 4.97 (H) 14.99 (H) 3.38   Prostatic Specific Antigen Latest Ref Range: 0.00 - 4.00 ng/mL                3.33     ASSESSMENT & PLAN:    Prostate cancer metastatic to bone ([place]) Castrate resistant prostate cancer-  Patient currently on Lupron androgen deprivation therapy; on Zytiga- since Aug 2017.  Jan 2018- bone scan- no significant active disease. Aug 16th  2018- PSA 3.15/improving [currently on 2 pills- sec to intol/lightheadness; nausea].   #  X-geva/ Lupron every 3 months [last may 29nd 2018]- on ca+vit D. No AEs noted. Ca- wnl.  Due for shots again in aug 2018.   # Back pain- ? Chronic vs bil rib pain [chronic] secondary malignancy status post radiation; improved; continue current regimen- fentanyl 100 g every 72 hours; continue oxycodone 10 mg every  8-12 hours. Continue current regimen.   # recent "pneumonia" s/p ABx- resolved.   # recent MI [April 2018; [place]]- s/p stenting; on Plavix; stable.  # Follow-up in 6 weeks; labs few days prior; X-geva/Lupron.      [name], MD 08/30/2016 8:40 AM

## 2016-08-22 NOTE — Assessment & Plan Note (Addendum)
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  Jan 2018- bone scan- no significant active disease. Aug 16th  2018- PSA 3.15/improving [currently on 2 pills- sec to intol/lightheadness; nausea].   # X-geva/ Lupron every 3 months [last may 29nd 2018]- on ca+vit D. No AEs noted. Ca- wnl.  Due for shots again in aug 2018.   # Back pain- ? Chronic vs bil rib pain [chronic] secondary malignancy status post radiation; improved; continue current regimen- fentanyl 100 g every 72 hours; continue oxycodone 10 mg every 8-12 hours. Continue current regimen.   # recent "pneumonia" s/p ABx- resolved.   # recent MI [April 2018; [place]]- s/p stenting; on Plavix; stable.  # Follow-up in 6 weeks; labs few days prior; X-geva/Lupron.

## 2016-08-30 ENCOUNTER — Ambulatory Visit: Payer: 59 | Admitting: Family Medicine

## 2016-09-20 ENCOUNTER — Other Ambulatory Visit: Payer: Self-pay | Admitting: *Deleted

## 2016-09-20 ENCOUNTER — Other Ambulatory Visit: Payer: Self-pay | Admitting: Internal Medicine

## 2016-09-20 DIAGNOSIS — C7951 Secondary malignant neoplasm of bone: Principal | ICD-10-CM

## 2016-09-20 DIAGNOSIS — G893 Neoplasm related pain (acute) (chronic): Secondary | ICD-10-CM

## 2016-09-20 DIAGNOSIS — C61 Malignant neoplasm of prostate: Secondary | ICD-10-CM

## 2016-09-20 MED ORDER — OXYCODONE HCL 10 MG PO TABS: ORAL_TABLET | ORAL | 0 refills | Status: DC

## 2016-09-20 MED ORDER — FENTANYL 100 MCG/HR TD PT72: 100.0000 ug | MEDICATED_PATCH | TRANSDERMAL | 0 refills | Status: DC

## 2016-09-20 MED ORDER — LISINOPRIL 2.5 MG PO TABS: 2.5000 mg | ORAL_TABLET | Freq: Every day | ORAL | 3 refills | Status: DC

## 2016-09-20 NOTE — Telephone Encounter (Signed)
Patient informed prescription ready for pick up

## 2016-09-21 ENCOUNTER — Telehealth: Payer: Self-pay | Admitting: Pharmacist

## 2016-09-21 ENCOUNTER — Other Ambulatory Visit: Payer: Self-pay | Admitting: *Deleted

## 2016-09-21 DIAGNOSIS — C61 Malignant neoplasm of prostate: Secondary | ICD-10-CM

## 2016-09-21 DIAGNOSIS — C7951 Secondary malignant neoplasm of bone: Principal | ICD-10-CM

## 2016-09-21 MED ORDER — ABIRATERONE ACETATE 250 MG PO TABS: 500.0000 mg | ORAL_TABLET | Freq: Every day | ORAL | 4 refills | Status: DC

## 2016-09-21 NOTE — Telephone Encounter (Signed)
.  Oral Oncology Patient Advocate Encounter  Received notification from [facility] that prior authorization for Robert Short is required.  PA submitted on CoverMyMeds Key Verbal Status is pending  Oral Oncology Clinic will continue to follow.   [facility] Patient Advocate [phone] 09/21/2016 2:05 PM

## 2016-09-21 NOTE — Telephone Encounter (Signed)
Oral Oncology Pharmacist Encounter  Received notification from [facility] That they received refill prescription for Zytiga (abiraterone) for the treatment of castrate resistant metastatic prostate cancer in conjunction with prednisone, planned duration until disease progression or unacceptable drug toxicity. Medication started 08/2015.  CMP from 08/17/16 assessed and BP from 08/22/16 assessed, patient BP on 08/22/16 was elevated compared to recent readings, will follow this at next appt.and see if the elevation persist. Prescription dose and frequency assessed.  Patient was dose reduced to 500mg  (2 tablets) daily d/t intolerance (lightheadedness and nausea).   Current medication list in Epic reviewed, one DDIs with Fabio Asa was identified: The concentration of metoprolol may be increased by the Zytiga. At a lower dose this interaction is less concerning and no change is needed, patient should just me monitored for bradycardia.   Oral Oncology Clinic will continue to follow patient.   Darl Pikes, PharmD, BCPS Hematology/Oncology Clinical Pharmacist [facility] [phone]  09/21/2016 12:16 PM

## 2016-09-21 NOTE — Telephone Encounter (Signed)
Oral Chemotherapy Pharmacist Encounter  I spoke with Mr. Robert Short for overview of refill prescription for Zytigafor the treatment of castrate resistant metastatic prostate cancer in conjunction with prednisone, planned duration until disease progression or unacceptable drug toxicity. Medication started 08/2015.  Pt was previously filling at an outside pharmacy but the medication is now being filled at [facility]. Walk the patient through the new filling process and informed him that the medication will be mailed to him.  Pt is doing well.  Reviewed administration, dosing, side effects, monitoring, drug-food interactions, safe handling, storage, and disposal. Patient will take Zytiga 2 tablets (500 mg total) by mouth daily. Take on an empty stomach 1 hour before or 2 hours after a meal. Additional patient is taking prednisone 5mg  by mouth two time daily with the Zytiga   Side effects include but not limited to: HTN, fatigue, HA, N/V/D. Patient had a elevated BP at his last office visit but stated that his BP medication is actively being altered for better control.  Reviewed with patient importance of keeping a medication schedule and plan for any missed doses.  Mr. Robert Short voiced understanding and appreciation. All questions answered.  Patient knows to call the office with questions or concerns. Oral Oncology Clinic will continue to follow.  Thank you,  Darl Pikes, PharmD, BCPS Hematology/Oncology Clinical Pharmacist [facility] [phone]  09/21/2016 1:51 PM

## 2016-09-22 ENCOUNTER — Telehealth: Payer: Self-pay | Admitting: Pharmacist

## 2016-09-22 NOTE — Telephone Encounter (Signed)
Oral Chemotherapy Pharmacist Encounter  Still awaiting insurance reauthorization. Called patient and LVM informing him of this.  Darl Pikes, PharmD, BCPS Hematology/Oncology Clinical Pharmacist [facility] [phone]  09/22/2016 3:47 PM

## 2016-09-25 NOTE — Telephone Encounter (Signed)
Oral Chemotherapy Pharmacist [employer] company to check on status of PA reauthorization completed on 9/20. They have not proceed PA. Resubmitted PA while on the phone and had them mark it at urgent.   Also submitted PA electronically through CoverMyMeds: Key: [id number] Status is pending  Thank you,  Darl Pikes, PharmD, BCPS Hematology/Oncology Clinical Pharmacist [facility] [phone]  09/25/2016 11:26 AM

## 2016-09-27 MED FILL — ZYTIGA 250 MG TABLET: 250 | 30 days supply | Qty: 60 | Fill #0

## 2016-09-27 NOTE — Telephone Encounter (Signed)
Oral Oncology Patient Advocate [employer] company again and they said still pending. I told them this patient is out of his Fabio Asa and needs it. It is very important that he takes it daily.    [facility] Patient Advocate [phone] 09/27/2016 4:44 PM

## 2016-09-28 ENCOUNTER — Telehealth: Payer: Self-pay | Admitting: Pharmacist

## 2016-09-28 ENCOUNTER — Other Ambulatory Visit: Payer: 59

## 2016-09-28 ENCOUNTER — Inpatient Hospital Stay: Payer: 59 | Attending: Internal Medicine

## 2016-09-28 DIAGNOSIS — F17210 Nicotine dependence, cigarettes, uncomplicated: Secondary | ICD-10-CM | POA: Diagnosis not present

## 2016-09-28 DIAGNOSIS — I10 Essential (primary) hypertension: Secondary | ICD-10-CM | POA: Insufficient documentation

## 2016-09-28 DIAGNOSIS — C7951 Secondary malignant neoplasm of bone: Secondary | ICD-10-CM | POA: Diagnosis not present

## 2016-09-28 DIAGNOSIS — I252 Old myocardial infarction: Secondary | ICD-10-CM | POA: Diagnosis not present

## 2016-09-28 DIAGNOSIS — Z79899 Other long term (current) drug therapy: Secondary | ICD-10-CM | POA: Insufficient documentation

## 2016-09-28 DIAGNOSIS — E785 Hyperlipidemia, unspecified: Secondary | ICD-10-CM | POA: Diagnosis not present

## 2016-09-28 DIAGNOSIS — Z79818 Long term (current) use of other agents affecting estrogen receptors and estrogen levels: Secondary | ICD-10-CM | POA: Diagnosis not present

## 2016-09-28 DIAGNOSIS — C61 Malignant neoplasm of prostate: Secondary | ICD-10-CM | POA: Insufficient documentation

## 2016-09-28 DIAGNOSIS — I2510 Atherosclerotic heart disease of native coronary artery without angina pectoris: Secondary | ICD-10-CM | POA: Insufficient documentation

## 2016-09-28 LAB — COMPREHENSIVE METABOLIC PANEL
ALBUMIN: 4 g/dL (ref 3.5–5.0)
ALK PHOS: 46 U/L (ref 38–126)
ALT: 15 U/L — AB (ref 17–63)
AST: 17 U/L (ref 15–41)
Anion gap: 6 (ref 5–15)
BILIRUBIN TOTAL: 0.5 mg/dL (ref 0.3–1.2)
BUN: 13 mg/dL (ref 6–20)
CO2: 28 mmol/L (ref 22–32)
CREATININE: 0.76 mg/dL (ref 0.61–1.24)
Calcium: 9 mg/dL (ref 8.9–10.3)
Chloride: 105 mmol/L (ref 101–111)
GFR calc Af Amer: >60 mL/min (ref >60)
GFR calc non Af Amer: >60 mL/min (ref >60)
Glucose, Bld: 126 mg/dL — ABNORMAL HIGH (ref 65–99)
Potassium: 3.3 mmol/L — ABNORMAL LOW (ref 3.5–5.1)
SODIUM: 139 mmol/L (ref 135–145)
Total Protein: 6.5 g/dL (ref 6.5–8.1)

## 2016-09-28 LAB — PSA: PROSTATIC SPECIFIC ANTIGEN: 2.99 ng/mL (ref 0.00–4.00)

## 2016-09-28 NOTE — Telephone Encounter (Signed)
Oral Chemotherapy Pharmacist Encounter  Received noticification frm [employer] that insurance was now allowing them to process the prescription. Zytiga will be mailed out to Mr. Routzahn today and he will have the medication tomorrow 9/28.  Called patient to inform him. He was very happy to hear this.  Darl Pikes, PharmD, BCPS Hematology/Oncology Clinical Pharmacist [facility] [phone]  09/28/2016 9:54 AM

## 2016-10-02 ENCOUNTER — Other Ambulatory Visit: Payer: Self-pay

## 2016-10-02 DIAGNOSIS — C7951 Secondary malignant neoplasm of bone: Principal | ICD-10-CM

## 2016-10-02 DIAGNOSIS — C61 Malignant neoplasm of prostate: Secondary | ICD-10-CM

## 2016-10-03 ENCOUNTER — Ambulatory Visit: Payer: 59

## 2016-10-03 ENCOUNTER — Inpatient Hospital Stay: Payer: 59

## 2016-10-03 ENCOUNTER — Ambulatory Visit: Payer: 59 | Admitting: Internal Medicine

## 2016-10-03 ENCOUNTER — Inpatient Hospital Stay: Payer: 59 | Attending: Internal Medicine | Admitting: Internal Medicine

## 2016-10-03 VITALS — BP 162/91 | HR 70 | Temp 96.3°F | Resp 18 | Wt 186.0 lb

## 2016-10-03 DIAGNOSIS — C61 Malignant neoplasm of prostate: Secondary | ICD-10-CM | POA: Insufficient documentation

## 2016-10-03 DIAGNOSIS — Z7902 Long term (current) use of antithrombotics/antiplatelets: Secondary | ICD-10-CM | POA: Insufficient documentation

## 2016-10-03 DIAGNOSIS — E785 Hyperlipidemia, unspecified: Secondary | ICD-10-CM | POA: Insufficient documentation

## 2016-10-03 DIAGNOSIS — C7951 Secondary malignant neoplasm of bone: Secondary | ICD-10-CM | POA: Diagnosis not present

## 2016-10-03 DIAGNOSIS — Z87442 Personal history of urinary calculi: Secondary | ICD-10-CM | POA: Insufficient documentation

## 2016-10-03 DIAGNOSIS — I2510 Atherosclerotic heart disease of native coronary artery without angina pectoris: Secondary | ICD-10-CM | POA: Diagnosis not present

## 2016-10-03 DIAGNOSIS — Z7982 Long term (current) use of aspirin: Secondary | ICD-10-CM | POA: Insufficient documentation

## 2016-10-03 DIAGNOSIS — Z79899 Other long term (current) drug therapy: Secondary | ICD-10-CM | POA: Diagnosis not present

## 2016-10-03 DIAGNOSIS — I10 Essential (primary) hypertension: Secondary | ICD-10-CM | POA: Diagnosis not present

## 2016-10-03 DIAGNOSIS — E876 Hypokalemia: Secondary | ICD-10-CM | POA: Insufficient documentation

## 2016-10-03 DIAGNOSIS — I252 Old myocardial infarction: Secondary | ICD-10-CM | POA: Diagnosis not present

## 2016-10-03 DIAGNOSIS — F17210 Nicotine dependence, cigarettes, uncomplicated: Secondary | ICD-10-CM | POA: Insufficient documentation

## 2016-10-03 DIAGNOSIS — Z955 Presence of coronary angioplasty implant and graft: Secondary | ICD-10-CM | POA: Diagnosis not present

## 2016-10-03 DIAGNOSIS — F329 Major depressive disorder, single episode, unspecified: Secondary | ICD-10-CM | POA: Insufficient documentation

## 2016-10-03 NOTE — Assessment & Plan Note (Addendum)
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  Jan 2018- bone scan- no significant active disease. Aug 16th  2018- PSA 2.99 /improving [currently on 3 pills- sec to intol/lightheadness; nausea].   # X-geva/ Lupron every 3 months [due again in Nov 2018 ]- on ca+vit D. No AEs noted. Ca- wnl.   # Back pain-chronic stable. fentanyl 100 g every 72 hours; continue oxycodone 10 mg every 8-12 hours. Continue current regimen.   # elevated Blood pressure- repeated 143/59; monitor at home.   # Hypokalemia mild 3.3- secondary to Zytiga. recommend increased that to supplementation.  # recent MI [April 2018; [place]]- s/p stenting; on Plavix; stable.  # Follow-up in 6 weeks; labs few days prior; X-geva/Lupron.

## 2016-10-03 NOTE — Progress Notes (Signed)
[facility] OFFICE PROGRESS NOTE  Short Care Team: Olin Hauser, DO as PCP - General (Family Medicine) Minna Merritts, MD as Consulting Physician (Cardiology)   SUMMARY OF ONCOLOGIC HISTORY: Oncology History   # 2011- PROSTATE CANCER [Gleason 3+4]; s/p Prostatectomy [ also involved bladder neck/ECP; Dr.Polaseck]; July 2014- Biochemical recurrence [PSA 14]- started on Zoladex [Dr.Pandit]; lost to follow up.  # JAN 2017- STAGE IV METASTATIC PROSTATE Cancer to Bone- Feb 13th, 2017-  Lupron q 56m [~end of feb]; PSA: 1021; Declined Chemo; April 2017 [xofigo x6; Dr.Crystal]; AUG 2017- Zytiga + Prednisone BID. Bone scan-Jan 2018- improved skeletal metastases.   # Mets to bone- start X-geva q 70M [May 30th]  # Smoker/ chronic pain/pain clinic      Prostate cancer metastatic to bone ([place])   12/08/2014 Initial Diagnosis    Prostate cancer metastatic to bone [facility])       INTERVAL HISTORY:   Robert Short  With above history of prostate cancer metastatic castrate resistant currently on Lupron + Zytiga is here for follow-up.   Short has not had any recent hospitalizations.  He continues to be on Zytiga plus prednisone. He is  currently taking only 3 pillsof Zytiga day.  His rib pain/ back pain is stable. However movement/strenuous work seems to exacerbate his pain.  Otherwise no swelling of the legs. No nausea no vomiting. He denies any jaw pain.  REVIEW OF SYSTEMS:  A complete 10 point review of system is done which is negative except mentioned above/history of present illness.   PAST MEDICAL HISTORY :  Past Medical History:  Diagnosis Date  . Back pain 10/09/2012  . Bone cancer ([place])   . CAD (coronary artery disease)    a. 08/2015 PCI: LM nl, LAD 20d, LCX nl, RCA 72m, RPAV 95 (3.0x18 Xience Alpine DES);  b. 04/2016 PCI [facility]): RPL 95 (2.75x8 Promus Premier DES).  . Cancer associated pain   . Depression   . History of  echocardiogram    a. 08/2016 Echo: EF 50-55%.  Marland Kitchen History of kidney stones   . Hyperlipidemia   . Hypertension   . Joint pain   . Prostate cancer [facility])    a.  s/p prostatectomy ([facility]);  b. bone mets noted 04/2016.  . Right arm pain 01/10/2016  . Right foot pain 01/10/2016  . Right leg pain 01/10/2016  . Tobacco abuse     PAST SURGICAL HISTORY :   Past Surgical History:  Procedure Laterality Date  . CARDIAC CATHETERIZATION     [facility]  . CARDIAC CATHETERIZATION N/A 08/16/2015   Procedure: Left Heart Cath and Coronary Angiography;  Surgeon: Yolonda Kida, MD;  Location: [facility] CV LAB;  Service: Cardiovascular;  Laterality: N/A;  . CARDIAC CATHETERIZATION N/A 08/16/2015   Procedure: Coronary Stent Intervention;  Surgeon: Yolonda Kida, MD;  Location: [facility] CV LAB;  Service: Cardiovascular;  Laterality: N/A;  . CERVIX SURGERY    . PROSTATECTOMY    . SPINE SURGERY    . TONSILLECTOMY    . WRIST SURGERY      FAMILY HISTORY :   Family History  Problem Relation Age of Onset  . Heart attack Mother   . Hypertension Mother   . Heart attack Father     SOCIAL HISTORY:   Social History  Substance Use Topics  . Smoking status: Current Every Day Smoker    Packs/day: 1.00    Years: 40.00    Types:  Cigarettes  . Smokeless tobacco: Current User  . Alcohol use No     Comment: occasionally    ALLERGIES:  has No Known Allergies.  MEDICATIONS:  Current Outpatient Prescriptions  Medication Sig Dispense Refill  . abiraterone Acetate (ZYTIGA) 250 MG tablet Take 2 tablets (500 mg total) by mouth daily. Take on an empty stomach 1 hour before or 2 hours after a meal 60 tablet 4  . acetaminophen (TYLENOL) 500 MG tablet Take 1,000 mg by mouth every 8 (eight) hours as needed.     Marland Kitchen aspirin 81 MG tablet Take 81 mg by mouth daily.    Marland Kitchen atorvastatin (LIPITOR) 80 MG tablet Take 1 tablet (80 mg total) by mouth daily at 6 PM. 90 tablet 3  . clopidogrel (PLAVIX) 75 MG tablet Take 1  tablet (75 mg total) by mouth daily. 90 tablet 3  . fentaNYL (DURAGESIC - DOSED MCG/HR) 100 MCG/HR Place 1 patch (100 mcg total) onto the skin every 3 (three) days. 10 patch 0  . lisinopril (PRINIVIL,ZESTRIL) 2.5 MG tablet Take 1 tablet (2.5 mg total) by mouth daily. 30 tablet 3  . metoprolol tartrate (LOPRESSOR) 25 MG tablet Take 1 tablet (25 mg total) by mouth 2 (two) times daily. (Short taking differently: Take 12.5 mg by mouth 2 (two) times daily. ) 60 tablet 1  . Oxycodone HCl 10 MG TABS One pill every 8 hours as needed for pain 90 tablet 0  . predniSONE (DELTASONE) 5 MG tablet TAKE 1 TABLET BY MOUTH 2 TIMES DAILY WITH A MEAL. TAKE DAILY WITH ZYTIGA 60 tablet 6  . albuterol (PROVENTIL HFA;VENTOLIN HFA) 108 (90 Base) MCG/ACT inhaler Inhale 1-2 puffs into the lungs every 6 (six) hours as needed for wheezing or shortness of breath. (Short not taking: Reported on 10/03/2016) 1 Inhaler 0  . nitroGLYCERIN (NITROSTAT) 0.4 MG SL tablet Place 0.4 mg under the tongue every 5 (five) minutes as needed.     . pantoprazole (PROTONIX) 40 MG tablet Take 1 tablet (40 mg total) by mouth daily at 6 (six) AM. (Short not taking: Reported on 10/03/2016) 30 tablet 0  . polyethylene glycol (MIRALAX / GLYCOLAX) packet Take 17 g by mouth daily as needed for moderate constipation.    . Wheat Dextrin (BENEFIBER) POWD Stir 2 tsp. TID into 4-8 oz of any non-carbonated beverage or soft food (hot or cold) (Short not taking: Reported on 10/03/2016) 500 g PRN   No current facility-administered medications for this visit.     PHYSICAL EXAMINATION: ECOG PERFORMANCE STATUS: 1 - Symptomatic but completely ambulatory  BP (!) 162/91 (BP Location: Left Arm, Short Position: Sitting)   Pulse 70   Temp (!) 96.3 F (35.7 C) (Tympanic)   Resp 18   Wt 185 lb 15.3 oz (84.4 kg)   BMI 26.68 kg/m   Filed Weights   10/03/16 1341  Weight: 185 lb 15.3 oz (84.4 kg)   GENERAL: Well-nourished well-developed; Alert, no distress  and comfortable.Walks with a cane.He is accompanied by his wife. EYES: no pallor or icterus OROPHARYNX: no thrush or ulceration; Upper dentures. NECK: supple, no masses felt LYMPH: no palpable lymphadenopathy in the cervical, axillary or inguinal regions LUNGS: clear to auscultation and No wheeze or crackles HEART/CVS: regular rate & rhythm and no murmurs; No lower extremity edema ABDOMEN:abdomen soft, non-tender and normal bowel sounds; No hepatomegaly. Musculoskeletal:no cyanosis of digits and no clubbing; No significant tenderness noted. PSYCH: alert & oriented x 3 with fluent speech NEURO: no focal motor/sensory deficits  SKIN: no rashes or significant lesions   LABORATORY DATA:  I have reviewed the data as listed    Component Value Date/Time   NA 139 09/28/2016 1350   NA 140 07/13/2013 1542   K 3.3 (L) 09/28/2016 1350   K 4.3 07/13/2013 1542   CL 105 09/28/2016 1350   CL 107 07/13/2013 1542   CO2 28 09/28/2016 1350   CO2 26 07/13/2013 1542   GLUCOSE 126 (H) 09/28/2016 1350   GLUCOSE 106 (H) 07/13/2013 1542   BUN 13 09/28/2016 1350   BUN 11 07/13/2013 1542   CREATININE 0.76 09/28/2016 1350   CREATININE 1.01 07/13/2013 1542   CALCIUM 9.0 09/28/2016 1350   CALCIUM 9.9 07/13/2013 1542   PROT 6.5 09/28/2016 1350   PROT 8.1 07/13/2013 1542   ALBUMIN 4.0 09/28/2016 1350   ALBUMIN 4.3 07/13/2013 1542   AST 17 09/28/2016 1350   AST 26 07/13/2013 1542   ALT 15 (L) 09/28/2016 1350   ALT 32 07/13/2013 1542   ALKPHOS 46 09/28/2016 1350   ALKPHOS 74 07/13/2013 1542   BILITOT 0.5 09/28/2016 1350   BILITOT 0.4 07/13/2013 1542   GFRNONAA >60 09/28/2016 1350   GFRNONAA >60 07/13/2013 1542   GFRAA >60 09/28/2016 1350   GFRAA >60 07/13/2013 1542    No results found for: SPEP, UPEP  Lab Results  Component Value Date   WBC 8.0 08/17/2016   NEUTROABS 6.2 08/17/2016   HGB 12.1 (L) 08/17/2016   HCT 36.2 (L) 08/17/2016   MCV 93.8 08/17/2016   PLT 240 08/17/2016       Chemistry      Component Value Date/Time   NA 139 09/28/2016 1350   NA 140 07/13/2013 1542   K 3.3 (L) 09/28/2016 1350   K 4.3 07/13/2013 1542   CL 105 09/28/2016 1350   CL 107 07/13/2013 1542   CO2 28 09/28/2016 1350   CO2 26 07/13/2013 1542   BUN 13 09/28/2016 1350   BUN 11 07/13/2013 1542   CREATININE 0.76 09/28/2016 1350   CREATININE 1.01 07/13/2013 1542      Component Value Date/Time   CALCIUM 9.0 09/28/2016 1350   CALCIUM 9.9 07/13/2013 1542   ALKPHOS 46 09/28/2016 1350   ALKPHOS 74 07/13/2013 1542   AST 17 09/28/2016 1350   AST 26 07/13/2013 1542   ALT 15 (L) 09/28/2016 1350   ALT 32 07/13/2013 1542   BILITOT 0.5 09/28/2016 1350   BILITOT 0.4 07/13/2013 1542     Results for Goza, Desmin L (MRN [id number]) as of 10/03/2016 13:49  Ref. Range 07/15/2012 13:47 08/15/2012 10:21 12/05/2012 10:07 02/08/2015 09:52 04/27/2015 11:49 06/01/2015 11:35 07/14/2015 13:25 08/10/2015 14:03 10/05/2015 13:44 11/18/2015 13:35 01/04/2016 13:25 02/03/2016 13:51 03/06/2016 14:53 04/14/2016 10:40 05/26/2016 13:49 07/06/2016 15:20 08/17/2016 11:03 09/28/2016 13:50  PSA Latest Ref Range: 0.00 - 4.00 ng/mL 14.2 (H) 14.6 (H) 0.2 1,021.00 (H) 33.39 (H) 43.62 (H) 14.08 (H) 15.26 (H) 1.40 3.03 6.79 (H) 10.47 (H) 4.97 (H) 14.99 (H) 3.38     Prostatic Specific Antigen Latest Ref Range: 0.00 - 4.00 ng/mL                3.33 3.15 2.99      ASSESSMENT & PLAN:    Prostate cancer metastatic to bone ([place]) Castrate resistant prostate cancer-  Short currently on Lupron androgen deprivation therapy; on Zytiga- since Aug 2017.  Jan 2018- bone scan- no significant active disease. Aug 16th  2018- PSA 2.99 /improving [currently on 3 pills-  sec to intol/lightheadness; nausea].   # X-geva/ Lupron every 3 months [due again in Nov 2018 ]- on ca+vit D. No AEs noted. Ca- wnl.   # Back pain-chronic stable. fentanyl 100 g every 72 hours; continue oxycodone 10 mg every 8-12 hours. Continue current regimen.   # elevated Blood pressure-  repeated 143/59; monitor at home.   # Hypokalemia mild 3.3- secondary to Zytiga. recommend increased that to supplementation.  # recent MI [April 2018; [place]]- s/p stenting; on Plavix; stable.  # Follow-up in 6 weeks; labs few days prior; X-geva/Lupron.      [name], MD 10/03/2016 2:03 PM

## 2016-10-03 NOTE — Progress Notes (Signed)
Here for fu

## 2016-10-04 NOTE — Telephone Encounter (Signed)
Oral Oncology Patient Advocate Encounter  Received Letter from [facility] that patient was approved for 12 fill  On his Zytiga between 09/26/2016-09/25/2017.    [place] Patient Advocate [phone] 10/04/2016 2:26 PM

## 2016-10-17 ENCOUNTER — Other Ambulatory Visit: Payer: Self-pay | Admitting: Internal Medicine

## 2016-10-17 DIAGNOSIS — G893 Neoplasm related pain (acute) (chronic): Secondary | ICD-10-CM

## 2016-10-17 DIAGNOSIS — C61 Malignant neoplasm of prostate: Secondary | ICD-10-CM

## 2016-10-17 DIAGNOSIS — C7951 Secondary malignant neoplasm of bone: Principal | ICD-10-CM

## 2016-10-17 MED ORDER — OXYCODONE HCL 10 MG PO TABS: ORAL_TABLET | ORAL | 0 refills | Status: DC

## 2016-10-17 MED ORDER — FENTANYL 100 MCG/HR TD PT72: 100.0000 ug | MEDICATED_PATCH | TRANSDERMAL | 0 refills | Status: DC

## 2016-10-18 ENCOUNTER — Ambulatory Visit (INDEPENDENT_AMBULATORY_CARE_PROVIDER_SITE_OTHER): Payer: 59 | Admitting: Family Medicine

## 2016-10-18 ENCOUNTER — Encounter: Payer: Self-pay | Admitting: Family Medicine

## 2016-10-18 VITALS — BP 127/70 | HR 58 | Temp 98.2°F | Resp 16 | Ht 70.0 in | Wt 184.0 lb

## 2016-10-18 DIAGNOSIS — D8989 Other specified disorders involving the immune mechanism, not elsewhere classified: Secondary | ICD-10-CM | POA: Diagnosis not present

## 2016-10-18 DIAGNOSIS — I208 Other forms of angina pectoris: Secondary | ICD-10-CM

## 2016-10-18 DIAGNOSIS — I10 Essential (primary) hypertension: Secondary | ICD-10-CM | POA: Diagnosis not present

## 2016-10-18 DIAGNOSIS — Z23 Encounter for immunization: Secondary | ICD-10-CM | POA: Diagnosis not present

## 2016-10-18 DIAGNOSIS — R5382 Chronic fatigue, unspecified: Secondary | ICD-10-CM

## 2016-10-18 DIAGNOSIS — R7303 Prediabetes: Secondary | ICD-10-CM

## 2016-10-18 DIAGNOSIS — I25118 Atherosclerotic heart disease of native coronary artery with other forms of angina pectoris: Secondary | ICD-10-CM

## 2016-10-18 LAB — POCT GLYCOSYLATED HEMOGLOBIN (HGB A1C): HEMOGLOBIN A1C: 6 — AB (ref ?–5.7)
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
## 2016-10-18 NOTE — Patient Instructions (Addendum)
Thank you for coming to the clinic today.  1.  Congratulations on getting your house back  2. A1c ____ will notify you with results, may consider medications if needed  3. BP is good today, check BP occasionally  4. Continue current meds  5. High Dose Flu Shot today, and Prevnar-13 pneumonia vaccine today  Please schedule a Follow-up Appointment to: Return in about 6 months (around 04/18/2017) for Pre-DM A1c, HTN.  If you have any other questions or concerns, please feel free to call the clinic or send a message through [facility]. You may also schedule an earlier appointment if necessary.  Additionally, you may be receiving a survey about your experience at our clinic within a few days to 1 week by e-mail or mail. We value your feedback.  Nobie Putnam, DO [facility]

## 2016-10-19 NOTE — Assessment & Plan Note (Signed)
Improved normal range BP - Home BP readings improved Complication with CAD, prostate CA   Plan:  1. Continue low dose BP medications since resumed by cardiology since previous visit - Lisinopril 2.5mg  daily and Metoprolol 12.5mg  BID (half tab 25) 2. Encourage improved lifestyle - low sodium diet, regular exercise as tolerated 3. Continue monitor BP outside office, bring readings to next visit, if persistently >140/90 or new symptoms notify office sooner 4. Follow-up 3-6 months, sooner as needed if persistent elevated BP

## 2016-10-19 NOTE — Assessment & Plan Note (Signed)
Well-controlled Pre-DM with A1c 6.0 today, stable Concern with chronic prednisone in setting of prostate CA  Plan:  1. Not on any therapy currently  2. Encourage improved lifestyle - low carb, low sugar diet, reduce portion size, continue improving regular exercise as tolerated 3. Follow-up 3-6 months PreDM A1c POC in office

## 2016-10-19 NOTE — Assessment & Plan Note (Signed)
Secondary to cancer/chemotherapy 

## 2016-10-19 NOTE — Assessment & Plan Note (Signed)
Asymptomatic currently, but has known stable angina Followed by [facility] Cardiology, last hospitalization CP 08/2016 with unremarkable results, previously prior stent 04/2016, prior STEMI 04/2015 - On med management 

## 2016-10-20 ENCOUNTER — Encounter: Payer: Self-pay | Admitting: *Deleted

## 2016-10-20 MED FILL — ZYTIGA 250 MG TABLET: 250 | 30 days supply | Qty: 60 | Fill #1

## 2016-10-20 NOTE — Progress Notes (Signed)
PA for Fentanyl submitted via cover my meds on 10/20/16 - Robert Short (Key: [id number]) - [id number]

## 2016-10-23 MED FILL — predniSONE 5 MG TABS: 5 | 30 days supply | Qty: 60 | Fill #0

## 2016-10-26 ENCOUNTER — Ambulatory Visit: Payer: 59 | Attending: Radiation Oncology | Admitting: Radiation Oncology

## 2016-11-09 ENCOUNTER — Inpatient Hospital Stay: Payer: 59 | Attending: Internal Medicine

## 2016-11-09 DIAGNOSIS — Z9079 Acquired absence of other genital organ(s): Secondary | ICD-10-CM | POA: Diagnosis not present

## 2016-11-09 DIAGNOSIS — C7951 Secondary malignant neoplasm of bone: Secondary | ICD-10-CM | POA: Diagnosis not present

## 2016-11-09 DIAGNOSIS — Z7982 Long term (current) use of aspirin: Secondary | ICD-10-CM | POA: Insufficient documentation

## 2016-11-09 DIAGNOSIS — I252 Old myocardial infarction: Secondary | ICD-10-CM | POA: Diagnosis not present

## 2016-11-09 DIAGNOSIS — Z87442 Personal history of urinary calculi: Secondary | ICD-10-CM | POA: Insufficient documentation

## 2016-11-09 DIAGNOSIS — Z79899 Other long term (current) drug therapy: Secondary | ICD-10-CM | POA: Diagnosis not present

## 2016-11-09 DIAGNOSIS — E785 Hyperlipidemia, unspecified: Secondary | ICD-10-CM | POA: Diagnosis not present

## 2016-11-09 DIAGNOSIS — F17210 Nicotine dependence, cigarettes, uncomplicated: Secondary | ICD-10-CM | POA: Diagnosis not present

## 2016-11-09 DIAGNOSIS — C61 Malignant neoplasm of prostate: Secondary | ICD-10-CM | POA: Diagnosis not present

## 2016-11-09 DIAGNOSIS — Z7902 Long term (current) use of antithrombotics/antiplatelets: Secondary | ICD-10-CM | POA: Diagnosis not present

## 2016-11-09 DIAGNOSIS — I10 Essential (primary) hypertension: Secondary | ICD-10-CM | POA: Diagnosis not present

## 2016-11-09 DIAGNOSIS — E876 Hypokalemia: Secondary | ICD-10-CM | POA: Diagnosis not present

## 2016-11-09 DIAGNOSIS — F329 Major depressive disorder, single episode, unspecified: Secondary | ICD-10-CM | POA: Diagnosis not present

## 2016-11-09 DIAGNOSIS — I2510 Atherosclerotic heart disease of native coronary artery without angina pectoris: Secondary | ICD-10-CM | POA: Insufficient documentation

## 2016-11-09 LAB — COMPREHENSIVE METABOLIC PANEL
ALT: 16 U/L — ABNORMAL LOW (ref 17–63)
AST: 19 U/L (ref 15–41)
Albumin: 4.1 g/dL (ref 3.5–5.0)
Alkaline Phosphatase: 42 U/L (ref 38–126)
Anion gap: 8 (ref 5–15)
BILIRUBIN TOTAL: 0.7 mg/dL (ref 0.3–1.2)
BUN: 10 mg/dL (ref 6–20)
CALCIUM: 9.1 mg/dL (ref 8.9–10.3)
CO2: 28 mmol/L (ref 22–32)
CREATININE: 0.7 mg/dL (ref 0.61–1.24)
Chloride: 101 mmol/L (ref 101–111)
GFR calc Af Amer: >60 mL/min (ref >60)
Glucose, Bld: 129 mg/dL — ABNORMAL HIGH (ref 65–99)
POTASSIUM: 3.2 mmol/L — AB (ref 3.5–5.1)
Sodium: 137 mmol/L (ref 135–145)
TOTAL PROTEIN: 6.8 g/dL (ref 6.5–8.1)

## 2016-11-09 LAB — CBC WITH DIFFERENTIAL/PLATELET
BASOS ABS: 0 10*3/uL (ref 0–0.1)
Basophils Relative: 1 %
Eosinophils Absolute: 0.1 10*3/uL (ref 0–0.7)
Eosinophils Relative: 1 %
HEMATOCRIT: 38.5 % — AB (ref 40.0–52.0)
Hemoglobin: 13.1 g/dL (ref 13.0–18.0)
LYMPHS PCT: 20 %
Lymphs Abs: 1 10*3/uL (ref 1.0–3.6)
MCH: 32.7 pg (ref 26.0–34.0)
MCHC: 33.9 g/dL (ref 32.0–36.0)
MCV: 96.5 fL (ref 80.0–100.0)
MONO ABS: 0.6 10*3/uL (ref 0.2–1.0)
MONOS PCT: 11 %
NEUTROS ABS: 3.4 10*3/uL (ref 1.4–6.5)
Neutrophils Relative %: 67 %
Platelets: 153 10*3/uL (ref 150–440)
RBC: 3.99 MIL/uL — ABNORMAL LOW (ref 4.40–5.90)
RDW: 15.1 % — AB (ref 11.5–14.5)
WBC: 5.1 10*3/uL (ref 3.8–10.6)

## 2016-11-09 LAB — PSA: PROSTATIC SPECIFIC ANTIGEN: 2.56 ng/mL (ref 0.00–4.00)

## 2016-11-13 ENCOUNTER — Other Ambulatory Visit: Payer: Self-pay | Admitting: Internal Medicine

## 2016-11-13 DIAGNOSIS — C61 Malignant neoplasm of prostate: Secondary | ICD-10-CM

## 2016-11-13 DIAGNOSIS — C7951 Secondary malignant neoplasm of bone: Principal | ICD-10-CM

## 2016-11-14 ENCOUNTER — Other Ambulatory Visit: Payer: Self-pay | Admitting: *Deleted

## 2016-11-14 ENCOUNTER — Inpatient Hospital Stay: Payer: 59

## 2016-11-14 ENCOUNTER — Other Ambulatory Visit: Payer: Self-pay

## 2016-11-14 ENCOUNTER — Inpatient Hospital Stay (HOSPITAL_BASED_OUTPATIENT_CLINIC_OR_DEPARTMENT_OTHER): Payer: 59 | Admitting: Internal Medicine

## 2016-11-14 VITALS — BP 163/99 | HR 72 | Temp 97.8°F | Resp 20 | Ht 70.0 in | Wt 183.2 lb

## 2016-11-14 DIAGNOSIS — Z79899 Other long term (current) drug therapy: Secondary | ICD-10-CM

## 2016-11-14 DIAGNOSIS — I252 Old myocardial infarction: Secondary | ICD-10-CM | POA: Diagnosis not present

## 2016-11-14 DIAGNOSIS — Z7902 Long term (current) use of antithrombotics/antiplatelets: Secondary | ICD-10-CM

## 2016-11-14 DIAGNOSIS — C61 Malignant neoplasm of prostate: Secondary | ICD-10-CM | POA: Diagnosis not present

## 2016-11-14 DIAGNOSIS — E785 Hyperlipidemia, unspecified: Secondary | ICD-10-CM | POA: Diagnosis not present

## 2016-11-14 DIAGNOSIS — E876 Hypokalemia: Secondary | ICD-10-CM | POA: Diagnosis not present

## 2016-11-14 DIAGNOSIS — C7951 Secondary malignant neoplasm of bone: Secondary | ICD-10-CM

## 2016-11-14 DIAGNOSIS — I10 Essential (primary) hypertension: Secondary | ICD-10-CM | POA: Diagnosis not present

## 2016-11-14 DIAGNOSIS — G893 Neoplasm related pain (acute) (chronic): Secondary | ICD-10-CM

## 2016-11-14 DIAGNOSIS — F17210 Nicotine dependence, cigarettes, uncomplicated: Secondary | ICD-10-CM

## 2016-11-14 MED ORDER — OXYCODONE HCL 10 MG PO TABS: ORAL_TABLET | ORAL | 0 refills | Status: DC

## 2016-11-14 MED ORDER — FENTANYL 100 MCG/HR TD PT72: 100.0000 ug | MEDICATED_PATCH | TRANSDERMAL | 0 refills | Status: DC

## 2016-11-14 MED ORDER — DENOSUMAB 120 MG/1.7ML ~~LOC~~ SOLN
120.0000 mg | Freq: Once | SUBCUTANEOUS | Status: AC
  Administered 2016-11-14: 120 mg via SUBCUTANEOUS

## 2016-11-14 MED ORDER — LEUPROLIDE ACETATE (3 MONTH) 22.5 MG IM KIT
22.5000 mg | PACK | Freq: Once | INTRAMUSCULAR | Status: AC
  Administered 2016-11-14: 22.5 mg via INTRAMUSCULAR

## 2016-11-14 NOTE — Progress Notes (Signed)
[facility] OFFICE PROGRESS NOTE  Patient Care Team: Olin Hauser, DO as PCP - General (Family Medicine) Minna Merritts, MD as Consulting Physician (Cardiology)   SUMMARY OF ONCOLOGIC HISTORY: Oncology History   # 2011- PROSTATE CANCER [Gleason 3+4]; s/p Prostatectomy [ also involved bladder neck/ECP; Dr.Polaseck]; July 2014- Biochemical recurrence [PSA 14]- started on Zoladex [Dr.Pandit]; lost to follow up.  # JAN 2017- STAGE IV METASTATIC PROSTATE Cancer to Bone- Feb 13th, 2017-  Lupron q 10m [~end of feb]; PSA: 1021; Declined Chemo; April 2017 [xofigo x6; Dr.Crystal]; AUG 2017- Zytiga + Prednisone BID. Bone scan-Jan 2018- improved skeletal metastases.   # Mets to bone- start X-geva q 1M [May 30th]  # Smoker/ chronic pain/pain clinic      Prostate cancer metastatic to bone ([facility])   12/08/2014 Initial Diagnosis    Prostate cancer metastatic to bone [facility])       INTERVAL HISTORY:   66 year old pleasant Caucasian male patient  With above history of prostate cancer metastatic castrate resistant currently on Lupron + Zytiga is here for follow-up.   He continues to be on Zytiga plus prednisone. He is  currently taking only 2 pillsof Zytiga day [secondary to nausea/fatigue dizziness].  Patient continues to have chronic hip pain/back pain.  Not any worse.  He denies any chest pain or shortness of breath or cough.  He states his fentanyl wears off sooner than 3 days.  He states cold weather seems to exacerbate his pain.  Otherwise no swelling of the legs. No nausea no vomiting. He denies any jaw pain.  REVIEW OF SYSTEMS:  A complete 10 point review of system is done which is negative except mentioned above/history of present illness.   PAST MEDICAL HISTORY :  Past Medical History:  Diagnosis Date  . Back pain 10/09/2012  . Bone cancer ([facility])   . CAD (coronary artery disease)    a. 08/2015 PCI: LM nl, LAD 20d, LCX nl, RCA 65m, RPAV 95 (3.0x18 Xience Alpine  DES);  b. 04/2016 PCI [facility]): RPL 95 (2.75x8 Promus Premier DES).  . Cancer associated pain   . Depression   . History of echocardiogram    a. 08/2016 Echo: EF 50-55%.  Marland Kitchen History of kidney stones   . Hyperlipidemia   . Hypertension   . Joint pain   . Prostate cancer [facility])    a.  s/p prostatectomy ([facility]);  b. bone mets noted 04/2016.  . Right arm pain 01/10/2016  . Right foot pain 01/10/2016  . Right leg pain 01/10/2016  . Tobacco abuse     PAST SURGICAL HISTORY :   Past Surgical History:  Procedure Laterality Date  . CARDIAC CATHETERIZATION     [facility]  . CERVIX SURGERY    . PROSTATECTOMY    . SPINE SURGERY    . TONSILLECTOMY    . WRIST SURGERY      FAMILY HISTORY :   Family History  Problem Relation Age of Onset  . Heart attack Mother   . Hypertension Mother   . Heart attack Father     SOCIAL HISTORY:   Social History   Tobacco Use  . Smoking status: Current Every Day Smoker    Packs/day: 1.00    Years: 40.00    Pack years: 40.00    Types: Cigarettes  . Smokeless tobacco: Current User  Substance Use Topics  . Alcohol use: No    Comment: occasionally  . Drug use: No    ALLERGIES:  has No Known Allergies.  MEDICATIONS:  Current Outpatient Medications  Medication Sig Dispense Refill  . abiraterone Acetate (ZYTIGA) 250 MG tablet Take 2 tablets (500 mg total) by mouth daily. Take on an empty stomach 1 hour before or 2 hours after a meal 60 tablet 4  . acetaminophen (TYLENOL) 500 MG tablet Take 1,000 mg by mouth every 8 (eight) hours as needed.     Marland Kitchen albuterol (PROVENTIL HFA;VENTOLIN HFA) 108 (90 Base) MCG/ACT inhaler Inhale 1-2 puffs into the lungs every 6 (six) hours as needed for wheezing or shortness of breath. 1 Inhaler 0  . aspirin 81 MG tablet Take 81 mg by mouth daily.    Marland Kitchen atorvastatin (LIPITOR) 80 MG tablet Take 1 tablet (80 mg total) by mouth daily at 6 PM. 90 tablet 3  . clopidogrel (PLAVIX) 75 MG tablet Take 1 tablet (75 mg total) by mouth daily.  90 tablet 3  . fentaNYL (DURAGESIC - DOSED MCG/HR) 100 MCG/HR Place 1 patch (100 mcg total) every 3 (three) days onto the skin. 10 patch 0  . lisinopril (PRINIVIL,ZESTRIL) 2.5 MG tablet Take 1 tablet (2.5 mg total) by mouth daily. 30 tablet 3  . metoprolol tartrate (LOPRESSOR) 25 MG tablet Take 1 tablet (25 mg total) by mouth 2 (two) times daily. (Patient taking differently: Take 12.5 mg by mouth 2 (two) times daily. ) 60 tablet 1  . nitroGLYCERIN (NITROSTAT) 0.4 MG SL tablet Place 0.4 mg under the tongue every 5 (five) minutes as needed.     . Oxycodone HCl 10 MG TABS One pill every 8 hours as needed for pain 90 tablet 0  . pantoprazole (PROTONIX) 40 MG tablet Take 1 tablet (40 mg total) by mouth daily at 6 (six) AM. 30 tablet 0  . polyethylene glycol (MIRALAX / GLYCOLAX) packet Take 17 g by mouth daily as needed for moderate constipation.    . predniSONE (DELTASONE) 5 MG tablet TAKE 1 TABLET BY MOUTH 2 TIMES DAILY WITH A MEAL. TAKE DAILY WITH ZYTIGA 60 tablet 6  . Wheat Dextrin (BENEFIBER) POWD Stir 2 tsp. TID into 4-8 oz of any non-carbonated beverage or soft food (hot or cold) 500 g PRN   No current facility-administered medications for this visit.     PHYSICAL EXAMINATION: ECOG PERFORMANCE STATUS: 1 - Symptomatic but completely ambulatory  BP (!) 163/99 (BP Location: Right Arm, Patient Position: Sitting)   Pulse 72   Temp 97.8 F (36.6 C) (Tympanic)   Resp 20   Ht 5\' 10"  (1.778 m)   Wt 183 lb 3.2 oz (83.1 kg)   BMI 26.29 kg/m   Filed Weights   11/14/16 1353  Weight: 183 lb 3.2 oz (83.1 kg)   GENERAL: Well-nourished well-developed; Alert, no distress and comfortable.Walks with a cane.He is accompanied by his wife. EYES: no pallor or icterus OROPHARYNX: no thrush or ulceration; Upper dentures. NECK: supple, no masses felt LYMPH: no palpable lymphadenopathy in the cervical, axillary or inguinal regions LUNGS: clear to auscultation and No wheeze or crackles HEART/CVS:  regular rate & rhythm and no murmurs; No lower extremity edema ABDOMEN:abdomen soft, non-tender and normal bowel sounds; No hepatomegaly. Musculoskeletal:no cyanosis of digits and no clubbing; No significant tenderness noted. PSYCH: alert & oriented x 3 with fluent speech NEURO: no focal motor/sensory deficits SKIN: no rashes or significant lesions   LABORATORY DATA:  I have reviewed the data as listed    Component Value Date/Time   NA 137 11/09/2016 1328   NA 140 07/13/2013  1542   K 3.2 (L) 11/09/2016 1328   K 4.3 07/13/2013 1542   CL 101 11/09/2016 1328   CL 107 07/13/2013 1542   CO2 28 11/09/2016 1328   CO2 26 07/13/2013 1542   GLUCOSE 129 (H) 11/09/2016 1328   GLUCOSE 106 (H) 07/13/2013 1542   BUN 10 11/09/2016 1328   BUN 11 07/13/2013 1542   CREATININE 0.70 11/09/2016 1328   CREATININE 1.01 07/13/2013 1542   CALCIUM 9.1 11/09/2016 1328   CALCIUM 9.9 07/13/2013 1542   PROT 6.8 11/09/2016 1328   PROT 8.1 07/13/2013 1542   ALBUMIN 4.1 11/09/2016 1328   ALBUMIN 4.3 07/13/2013 1542   AST 19 11/09/2016 1328   AST 26 07/13/2013 1542   ALT 16 (L) 11/09/2016 1328   ALT 32 07/13/2013 1542   ALKPHOS 42 11/09/2016 1328   ALKPHOS 74 07/13/2013 1542   BILITOT 0.7 11/09/2016 1328   BILITOT 0.4 07/13/2013 1542   GFRNONAA >60 11/09/2016 1328   GFRNONAA >60 07/13/2013 1542   GFRAA >60 11/09/2016 1328   GFRAA >60 07/13/2013 1542    No results found for: SPEP, UPEP  Lab Results  Component Value Date   WBC 5.1 11/09/2016   NEUTROABS 3.4 11/09/2016   HGB 13.1 11/09/2016   HCT 38.5 (L) 11/09/2016   MCV 96.5 11/09/2016   PLT 153 11/09/2016      Chemistry      Component Value Date/Time   NA 137 11/09/2016 1328   NA 140 07/13/2013 1542   K 3.2 (L) 11/09/2016 1328   K 4.3 07/13/2013 1542   CL 101 11/09/2016 1328   CL 107 07/13/2013 1542   CO2 28 11/09/2016 1328   CO2 26 07/13/2013 1542   BUN 10 11/09/2016 1328   BUN 11 07/13/2013 1542   CREATININE 0.70 11/09/2016  1328   CREATININE 1.01 07/13/2013 1542      Component Value Date/Time   CALCIUM 9.1 11/09/2016 1328   CALCIUM 9.9 07/13/2013 1542   ALKPHOS 42 11/09/2016 1328   ALKPHOS 74 07/13/2013 1542   AST 19 11/09/2016 1328   AST 26 07/13/2013 1542   ALT 16 (L) 11/09/2016 1328   ALT 32 07/13/2013 1542   BILITOT 0.7 11/09/2016 1328   BILITOT 0.4 07/13/2013 1542     Results for Coverdale, Rutherford L (MRN [id number]) as of 11/14/2016 14:14  Ref. Range 10/05/2015 13:44 11/18/2015 13:35 01/04/2016 13:25 02/03/2016 13:51 03/06/2016 14:53 04/14/2016 10:40 05/26/2016 13:49 07/06/2016 15:20 08/17/2016 11:03 09/28/2016 13:50 11/09/2016 13:28  PSA Latest Ref Range: 0.00 - 4.00 ng/mL 1.40 3.03 6.79 (H) 10.47 (H) 4.97 (H) 14.99 (H) 3.38      Prostatic Specific Antigen Latest Ref Range: 0.00 - 4.00 ng/mL        3.33 3.15 2.99 2.56       ASSESSMENT & PLAN:    Prostate cancer metastatic to bone ([facility]) Castrate resistant prostate cancer-  Patient currently on Lupron androgen deprivation therapy; on Zytiga- since Aug 2017.  Jan 2018- bone scan- no significant active disease.  # Nov 2018- PSA 2.5 /improving [currently on 2 pills- sec to intol/lightheadness; nausea].   # X-geva/ Lupron every 3 months [due again in Nov 2018 ]- on ca+vit D. No AEs noted. Ca- wnl.   # Back pain-chronic stable. fentanyl 100 g every 72 hours; continue oxycodone 10 mg every 8-12 hours. Continue current regimen.   # Elevated Blood pressure-166/99; repeated 134/77; closely monitor at home.   # Hypokalemia mild 3.2- secondary to Zytiga. Continue  supplementation.  # recent MI [April 2018; [place]]- s/p stenting; on Plavix; stable.  # Follow-up in 8 weeks; labs few days prior.      [name], MD 11/14/2016 4:14 PM

## 2016-11-14 NOTE — Patient Instructions (Signed)
Denosumab injection What is this medicine? DENOSUMAB (den oh sue mab) slows bone breakdown. Prolia is used to treat osteoporosis in women after menopause and in men. Delton See is used to treat a high calcium level due to cancer and to prevent bone fractures and other bone problems caused by multiple myeloma or cancer bone metastases. Delton See is also used to treat giant cell tumor of the bone. This medicine may be used for other purposes; ask your health care provider or pharmacist if you have questions. COMMON BRAND NAME(S): Prolia, XGEVA What should I tell my health care provider before I take this medicine? They need to know if you have any of these conditions: -dental disease -having surgery or tooth extraction -infection -kidney disease -low levels of calcium or Vitamin D in the blood -malnutrition -on hemodialysis -skin conditions or sensitivity -thyroid or parathyroid disease -an unusual reaction to denosumab, other medicines, foods, dyes, or preservatives -pregnant or trying to get pregnant -breast-feeding How should I use this medicine? This medicine is for injection under the skin. It is given by a health care professional in a hospital or clinic setting. If you are getting Prolia, a special MedGuide will be given to you by the pharmacist with each prescription and refill. Be sure to read this information carefully each time. For Prolia, talk to your pediatrician regarding the use of this medicine in children. Special care may be needed. For Delton See, talk to your pediatrician regarding the use of this medicine in children. While this drug may be prescribed for children as young as 13 years for selected conditions, precautions do apply. Overdosage: If you think you have taken too much of this medicine contact a poison control center or emergency room at once. NOTE: This medicine is only for you. Do not share this medicine with others. What if I miss a dose? It is important not to miss your  dose. Call your doctor or health care professional if you are unable to keep an appointment. What may interact with this medicine? Do not take this medicine with any of the following medications: -other medicines containing denosumab This medicine may also interact with the following medications: -medicines that lower your chance of fighting infection -steroid medicines like prednisone or cortisone This list may not describe all possible interactions. Give your health care provider a list of all the medicines, herbs, non-prescription drugs, or dietary supplements you use. Also tell them if you smoke, drink alcohol, or use illegal drugs. Some items may interact with your medicine. What should I watch for while using this medicine? Visit your doctor or health care professional for regular checks on your progress. Your doctor or health care professional may order blood tests and other tests to see how you are doing. Call your doctor or health care professional for advice if you get a fever, chills or sore throat, or other symptoms of a cold or flu. Do not treat yourself. This drug may decrease your body's ability to fight infection. Try to avoid being around people who are sick. You should make sure you get enough calcium and vitamin D while you are taking this medicine, unless your doctor tells you not to. Discuss the foods you eat and the vitamins you take with your health care professional. See your dentist regularly. Brush and floss your teeth as directed. Before you have any dental work done, tell your dentist you are receiving this medicine. Do not become pregnant while taking this medicine or for 5 months after stopping  it. Talk with your doctor or health care professional about your birth control options while taking this medicine. Women should inform their doctor if they wish to become pregnant or think they might be pregnant. There is a potential for serious side effects to an unborn child. Talk  to your health care professional or pharmacist for more information. What side effects may I notice from receiving this medicine? Side effects that you should report to your doctor or health care professional as soon as possible: -allergic reactions like skin rash, itching or hives, swelling of the face, lips, or tongue -bone pain -breathing problems -dizziness -jaw pain, especially after dental work -redness, blistering, peeling of the skin -signs and symptoms of infection like fever or chills; cough; sore throat; pain or trouble passing urine -signs of low calcium like fast heartbeat, muscle cramps or muscle pain; pain, tingling, numbness in the hands or feet; seizures -unusual bleeding or bruising -unusually weak or tired Side effects that usually do not require medical attention (report to your doctor or health care professional if they continue or are bothersome): -constipation -diarrhea -headache -joint pain -loss of appetite -muscle pain -runny nose -tiredness -upset stomach This list may not describe all possible side effects. Call your doctor for medical advice about side effects. You may report side effects to FDA at 1-800-FDA-1088. Where should I keep my medicine? This medicine is only given in a clinic, doctor's office, or other health care setting and will not be stored at home. NOTE: This sheet is a summary. It may not cover all possible information. If you have questions about this medicine, talk to your doctor, pharmacist, or health care provider.  2018 Elsevier/Gold Standard (2016-01-11 19:17:21) Leuprolide injection What is this medicine? LEUPROLIDE (loo PROE lide) is a man-made hormone. It is used to treat the symptoms of prostate cancer. This medicine may also be used to treat children with early onset of puberty. It may be used for other hormonal conditions. This medicine may be used for other purposes; ask your health care provider or pharmacist if you have  questions. COMMON BRAND NAME(S): Lupron What should I tell my health care provider before I take this medicine? They need to know if you have any of these conditions: -diabetes -heart disease or previous heart attack -high blood pressure -high cholesterol -pain or difficulty passing urine -spinal cord metastasis -stroke -tobacco smoker -an unusual or allergic reaction to leuprolide, benzyl alcohol, other medicines, foods, dyes, or preservatives -pregnant or trying to get pregnant -breast-feeding How should I use this medicine? This medicine is for injection under the skin or into a muscle. You will be taught how to prepare and give this medicine. Use exactly as directed. Take your medicine at regular intervals. Do not take your medicine more often than directed. It is important that you put your used needles and syringes in a special sharps container. Do not put them in a trash can. If you do not have a sharps container, call your pharmacist or healthcare provider to get one. A special MedGuide will be given to you by the pharmacist with each prescription and refill. Be sure to read this information carefully each time. Talk to your pediatrician regarding the use of this medicine in children. While this medicine may be prescribed for children as young as 8 years for selected conditions, precautions do apply. Overdosage: If you think you have taken too much of this medicine contact a poison control center or emergency room at once. NOTE: This  medicine is only for you. Do not share this medicine with others. What if I miss a dose? If you miss a dose, take it as soon as you can. If it is almost time for your next dose, take only that dose. Do not take double or extra doses. What may interact with this medicine? Do not take this medicine with any of the following medications: -chasteberry This medicine may also interact with the following medications: -herbal or dietary supplements, like  black cohosh or DHEA -male hormones, like estrogens or progestins and birth control pills, patches, rings, or injections -male hormones, like testosterone This list may not describe all possible interactions. Give your health care provider a list of all the medicines, herbs, non-prescription drugs, or dietary supplements you use. Also tell them if you smoke, drink alcohol, or use illegal drugs. Some items may interact with your medicine. What should I watch for while using this medicine? Visit your doctor or health care professional for regular checks on your progress. During the first week, your symptoms may get worse, but then will improve as you continue your treatment. You may get hot flashes, increased bone pain, increased difficulty passing urine, or an aggravation of nerve symptoms. Discuss these effects with your doctor or health care professional, some of them may improve with continued use of this medicine. Male patients may experience a menstrual cycle or spotting during the first 2 months of therapy with this medicine. If this continues, contact your doctor or health care professional. What side effects may I notice from receiving this medicine? Side effects that you should report to your doctor or health care professional as soon as possible: -allergic reactions like skin rash, itching or hives, swelling of the face, lips, or tongue -breathing problems -chest pain -depression or memory disorders -pain in your legs or groin -pain at site where injected -severe headache -swelling of the feet and legs -visual changes -vomiting Side effects that usually do not require medical attention (report to your doctor or health care professional if they continue or are bothersome): -breast swelling or tenderness -decrease in sex drive or performance -diarrhea -hot flashes -loss of appetite -muscle, joint, or bone pains -nausea -redness or irritation at site where injected -skin  problems or acne This list may not describe all possible side effects. Call your doctor for medical advice about side effects. You may report side effects to FDA at 1-800-FDA-1088. Where should I keep my medicine? Keep out of the reach of children. Store below 25 degrees C (77 degrees F). Do not freeze. Protect from light. Do not use if it is not clear or if there are particles present. Throw away any unused medicine after the expiration date. NOTE: This sheet is a summary. It may not cover all possible information. If you have questions about this medicine, talk to your doctor, pharmacist, or health care provider.  2018 Elsevier/Gold Standard (2015-08-09 10:54:35)

## 2016-11-14 NOTE — Assessment & Plan Note (Addendum)
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  Jan 2018- bone scan- no significant active disease.  # Nov 2018- PSA 2.5 /improving [currently on 2 pills- sec to intol/lightheadness; nausea].   # X-geva/ Lupron every 3 months [due again in Nov 2018 ]- on ca+vit D. No AEs noted. Ca- wnl.   # Back pain-chronic stable. fentanyl 100 g every 72 hours; continue oxycodone 10 mg every 8-12 hours. Continue current regimen.   # Elevated Blood pressure-166/99; repeated 134/77; closely monitor at home.   # Hypokalemia mild 3.2- secondary to Zytiga. Continue supplementation.  # recent MI [April 2018; [place]]- s/p stenting; on Plavix; stable.  # Follow-up in 8 weeks; labs few days prior.

## 2016-11-16 MED FILL — predniSONE 5 MG TABS: 5 | 30 days supply | Qty: 60 | Fill #1

## 2016-11-16 MED FILL — ZYTIGA 250 MG TABLET: 250 | 30 days supply | Qty: 60 | Fill #2

## 2016-12-11 ENCOUNTER — Other Ambulatory Visit: Payer: Self-pay | Admitting: Internal Medicine

## 2016-12-11 DIAGNOSIS — C7951 Secondary malignant neoplasm of bone: Secondary | ICD-10-CM

## 2016-12-11 DIAGNOSIS — G893 Neoplasm related pain (acute) (chronic): Secondary | ICD-10-CM

## 2016-12-11 DIAGNOSIS — C61 Malignant neoplasm of prostate: Secondary | ICD-10-CM

## 2016-12-12 MED ORDER — OXYCODONE HCL 10 MG PO TABS: ORAL_TABLET | ORAL | 0 refills | Status: DC

## 2016-12-12 MED ORDER — FENTANYL 100 MCG/HR TD PT72: 100.0000 ug | MEDICATED_PATCH | TRANSDERMAL | 0 refills | Status: DC

## 2016-12-12 MED FILL — ABIRATERONE ACETATE 250 MG: 250 | 30 days supply | Qty: 60 | Fill #3

## 2016-12-12 MED FILL — predniSONE 5 MG TABS: 5 | 30 days supply | Qty: 60 | Fill #2

## 2016-12-13 ENCOUNTER — Other Ambulatory Visit: Payer: Self-pay

## 2016-12-13 MED ORDER — METOPROLOL TARTRATE 25 MG PO TABS: 12.5000 mg | ORAL_TABLET | Freq: Two times a day (BID) | ORAL | 3 refills | Status: DC

## 2016-12-13 NOTE — Telephone Encounter (Signed)
Refill sent for Metoprolol Tart 25 mg take 1/2 tablet daily.

## 2016-12-14 ENCOUNTER — Telehealth: Payer: Self-pay | Admitting: Pharmacist

## 2016-12-14 NOTE — Telephone Encounter (Signed)
Oral Chemotherapy Pharmacist Encounter   Attempted to reach patient for follow up on oral medication: Zytiga (abiraterone). No answer. Left VM for patient to call back.    Darl Pikes, PharmD, BCPS Hematology/Oncology Clinical Pharmacist [facility] [phone]  12/14/2016 4:36 PM

## 2016-12-15 NOTE — Telephone Encounter (Signed)
Oral Chemotherapy Pharmacist Encounter  Follow-Up Form  Called patient today to follow up regarding patient's oral chemotherapy medication: Zytiga (abiraterone). Spoke with his wife Whalen Trompeter  Original Start date of oral chemotherapy: 08/2015  Pt reports 0 tablets/doses of Zytiga missed in the last month. Reviewed plan for missed doses.   Pt reports the following side effects: N/A  Recent labs reviewed: CMP from 11/09/16  New medications?: no new medication per his wife  Other Issues: N/A  Patient knows to call the office with questions or concerns. Oral Oncology Clinic will continue to follow.  Darl Pikes, PharmD, BCPS Hematology/Oncology Clinical Pharmacist [facility] [phone]  12/15/2016 12:02 PM

## 2017-01-05 MED FILL — predniSONE 5 MG TABS: 5 | 30 days supply | Qty: 60 | Fill #3

## 2017-01-05 MED FILL — ZYTIGA 250 MG TABLET: 250 | 30 days supply | Qty: 60 | Fill #4

## 2017-01-08 ENCOUNTER — Inpatient Hospital Stay: Payer: 59

## 2017-01-08 DIAGNOSIS — F329 Major depressive disorder, single episode, unspecified: Secondary | ICD-10-CM | POA: Diagnosis not present

## 2017-01-08 DIAGNOSIS — Z9079 Acquired absence of other genital organ(s): Secondary | ICD-10-CM | POA: Insufficient documentation

## 2017-01-08 DIAGNOSIS — I2510 Atherosclerotic heart disease of native coronary artery without angina pectoris: Secondary | ICD-10-CM | POA: Insufficient documentation

## 2017-01-08 DIAGNOSIS — Z79899 Other long term (current) drug therapy: Secondary | ICD-10-CM | POA: Insufficient documentation

## 2017-01-08 DIAGNOSIS — Z87442 Personal history of urinary calculi: Secondary | ICD-10-CM | POA: Diagnosis not present

## 2017-01-08 DIAGNOSIS — F17210 Nicotine dependence, cigarettes, uncomplicated: Secondary | ICD-10-CM | POA: Insufficient documentation

## 2017-01-08 DIAGNOSIS — R1011 Right upper quadrant pain: Secondary | ICD-10-CM | POA: Diagnosis not present

## 2017-01-08 DIAGNOSIS — I252 Old myocardial infarction: Secondary | ICD-10-CM | POA: Insufficient documentation

## 2017-01-08 DIAGNOSIS — C7951 Secondary malignant neoplasm of bone: Secondary | ICD-10-CM | POA: Insufficient documentation

## 2017-01-08 DIAGNOSIS — C61 Malignant neoplasm of prostate: Secondary | ICD-10-CM

## 2017-01-08 DIAGNOSIS — E785 Hyperlipidemia, unspecified: Secondary | ICD-10-CM | POA: Insufficient documentation

## 2017-01-08 DIAGNOSIS — Z7982 Long term (current) use of aspirin: Secondary | ICD-10-CM | POA: Diagnosis not present

## 2017-01-08 DIAGNOSIS — Z955 Presence of coronary angioplasty implant and graft: Secondary | ICD-10-CM | POA: Diagnosis not present

## 2017-01-08 DIAGNOSIS — Z7902 Long term (current) use of antithrombotics/antiplatelets: Secondary | ICD-10-CM | POA: Diagnosis not present

## 2017-01-08 DIAGNOSIS — I10 Essential (primary) hypertension: Secondary | ICD-10-CM | POA: Diagnosis not present

## 2017-01-08 DIAGNOSIS — E876 Hypokalemia: Secondary | ICD-10-CM | POA: Diagnosis not present

## 2017-01-08 LAB — COMPREHENSIVE METABOLIC PANEL
ALBUMIN: 4.2 g/dL (ref 3.5–5.0)
ALT: 13 U/L — AB (ref 17–63)
AST: 18 U/L (ref 15–41)
Alkaline Phosphatase: 42 U/L (ref 38–126)
Anion gap: 7 (ref 5–15)
BILIRUBIN TOTAL: 0.5 mg/dL (ref 0.3–1.2)
BUN: 16 mg/dL (ref 6–20)
CO2: 26 mmol/L (ref 22–32)
Calcium: 9.1 mg/dL (ref 8.9–10.3)
Chloride: 104 mmol/L (ref 101–111)
Creatinine, Ser: 0.79 mg/dL (ref 0.61–1.24)
GFR calc Af Amer: >60 mL/min (ref >60)
GFR calc non Af Amer: >60 mL/min (ref >60)
GLUCOSE: 128 mg/dL — AB (ref 65–99)
POTASSIUM: 4.2 mmol/L (ref 3.5–5.1)
Sodium: 137 mmol/L (ref 135–145)
TOTAL PROTEIN: 7 g/dL (ref 6.5–8.1)

## 2017-01-08 LAB — CBC WITH DIFFERENTIAL/PLATELET
BASOS ABS: 0.1 10*3/uL (ref 0–0.1)
Basophils Relative: 1 %
EOS PCT: 1 %
Eosinophils Absolute: 0 10*3/uL (ref 0–0.7)
HCT: 39.6 % — ABNORMAL LOW (ref 40.0–52.0)
Hemoglobin: 13.2 g/dL (ref 13.0–18.0)
LYMPHS ABS: 0.8 10*3/uL — AB (ref 1.0–3.6)
LYMPHS PCT: 9 %
MCH: 32.4 pg (ref 26.0–34.0)
MCHC: 33.4 g/dL (ref 32.0–36.0)
MCV: 97.1 fL (ref 80.0–100.0)
MONO ABS: 0.6 10*3/uL (ref 0.2–1.0)
Monocytes Relative: 8 %
Neutro Abs: 6.9 10*3/uL — ABNORMAL HIGH (ref 1.4–6.5)
Neutrophils Relative %: 81 %
PLATELETS: 165 10*3/uL (ref 150–440)
RBC: 4.08 MIL/uL — ABNORMAL LOW (ref 4.40–5.90)
RDW: 13.8 % (ref 11.5–14.5)
WBC: 8.5 10*3/uL (ref 3.8–10.6)

## 2017-01-08 LAB — PSA: Prostatic Specific Antigen: 5.87 ng/mL — ABNORMAL HIGH (ref 0.00–4.00)

## 2017-01-09 ENCOUNTER — Inpatient Hospital Stay: Payer: 59 | Attending: Internal Medicine | Admitting: Internal Medicine

## 2017-01-09 ENCOUNTER — Encounter: Payer: Self-pay | Admitting: Internal Medicine

## 2017-01-09 VITALS — BP 145/87 | HR 73 | Temp 97.4°F | Resp 16 | Wt 184.1 lb

## 2017-01-09 DIAGNOSIS — F17210 Nicotine dependence, cigarettes, uncomplicated: Secondary | ICD-10-CM

## 2017-01-09 DIAGNOSIS — C7951 Secondary malignant neoplasm of bone: Secondary | ICD-10-CM

## 2017-01-09 DIAGNOSIS — Z79899 Other long term (current) drug therapy: Secondary | ICD-10-CM | POA: Diagnosis not present

## 2017-01-09 DIAGNOSIS — Z955 Presence of coronary angioplasty implant and graft: Secondary | ICD-10-CM | POA: Diagnosis not present

## 2017-01-09 DIAGNOSIS — I252 Old myocardial infarction: Secondary | ICD-10-CM | POA: Diagnosis not present

## 2017-01-09 DIAGNOSIS — R1011 Right upper quadrant pain: Secondary | ICD-10-CM

## 2017-01-09 DIAGNOSIS — C61 Malignant neoplasm of prostate: Secondary | ICD-10-CM | POA: Diagnosis not present

## 2017-01-09 DIAGNOSIS — I10 Essential (primary) hypertension: Secondary | ICD-10-CM

## 2017-01-09 DIAGNOSIS — G893 Neoplasm related pain (acute) (chronic): Secondary | ICD-10-CM

## 2017-01-09 DIAGNOSIS — Z7902 Long term (current) use of antithrombotics/antiplatelets: Secondary | ICD-10-CM | POA: Diagnosis not present

## 2017-01-09 DIAGNOSIS — E876 Hypokalemia: Secondary | ICD-10-CM

## 2017-01-09 MED ORDER — FENTANYL 100 MCG/HR TD PT72: 100.0000 ug | MEDICATED_PATCH | TRANSDERMAL | 0 refills | Status: DC

## 2017-01-09 MED ORDER — HYDROCODONE-ACETAMINOPHEN 10-325 MG PO TABS: 1.0000 | ORAL_TABLET | Freq: Four times a day (QID) | ORAL | 0 refills | Status: DC | PRN

## 2017-01-09 NOTE — Progress Notes (Signed)
[place] OFFICE PROGRESS NOTE  Patient Care Team: Olin Hauser, DO as PCP - General (Family Medicine) Minna Merritts, MD as Consulting Physician (Cardiology)   SUMMARY OF ONCOLOGIC HISTORY: Oncology History   # 2011- PROSTATE CANCER [Gleason 3+4]; s/p Prostatectomy [ also involved bladder neck/ECP; Dr.Polaseck]; July 2014- Biochemical recurrence [PSA 14]- started on Zoladex [Dr.Pandit]; lost to follow up.  # JAN 2017- STAGE IV METASTATIC PROSTATE Cancer to Bone- Feb 13th, 2017-  Lupron q 53m [~end of feb]; PSA: 1021; Declined Chemo; April 2017 [xofigo x6; Dr.Crystal]; AUG 2017- Zytiga + Prednisone BID. Bone scan-Jan 2018- improved skeletal metastases.   # Mets to bone- start X-geva q 30M [May 30th]  # Smoker/ chronic pain/pain clinic      Prostate cancer metastatic to bone ([place])   12/08/2014 Initial Diagnosis    Prostate cancer metastatic to bone [facility])       INTERVAL HISTORY:   [name]  With above history of prostate cancer metastatic castrate resistant currently on Lupron + Zytiga is here for follow-up.   Patient complains of worsening pain in the right upper quadrant over the last few months.  Worse with food. He is  currently taking only 2 pillsof Zytiga day [secondary to nausea/fatigue dizziness].  Continues to have aches and pains in his back.  Chronic.  Continues pain narcotic pain medication fentanyl patch; however he feels his pain has been better controlled with hydrocodone-Tylenol combination in the past. Otherwise no swelling of the legs. No nausea no vomiting. He denies any jaw pain.  REVIEW OF SYSTEMS:  A complete 10 point review of system is done which is negative except mentioned above/history of present illness.   PAST MEDICAL HISTORY :  Past Medical History:  Diagnosis Date  . Back pain 10/09/2012  . Bone cancer ([place])   . CAD (coronary artery disease)    a. 08/2015 PCI: LM nl, LAD 20d, LCX nl,  RCA 45m, RPAV 95 (3.0x18 Xience Alpine DES);  b. 04/2016 PCI [facility]): RPL 95 (2.75x8 Promus Premier DES).  . Cancer associated pain   . Depression   . History of echocardiogram    a. 08/2016 Echo: EF 50-55%.  Marland Kitchen History of kidney stones   . Hyperlipidemia   . Hypertension   . Joint pain   . Prostate cancer [facility])    a.  s/p prostatectomy ([facility]);  b. bone mets noted 04/2016.  . Right arm pain 01/10/2016  . Right foot pain 01/10/2016  . Right leg pain 01/10/2016  . Tobacco abuse     PAST SURGICAL HISTORY :   Past Surgical History:  Procedure Laterality Date  . CARDIAC CATHETERIZATION     [facility]  . CARDIAC CATHETERIZATION N/A 08/16/2015   Procedure: Left Heart Cath and Coronary Angiography;  Surgeon: Yolonda Kida, MD;  Location: [place] CV LAB;  Service: Cardiovascular;  Laterality: N/A;  . CARDIAC CATHETERIZATION N/A 08/16/2015   Procedure: Coronary Stent Intervention;  Surgeon: Yolonda Kida, MD;  Location: [place] CV LAB;  Service: Cardiovascular;  Laterality: N/A;  . CERVIX SURGERY    . PROSTATECTOMY    . SPINE SURGERY    . TONSILLECTOMY    . WRIST SURGERY      FAMILY HISTORY :   Family History  Problem Relation Age of Onset  . Heart attack Mother   . Hypertension Mother   . Heart attack Father     SOCIAL HISTORY:   Social History  Tobacco Use  . Smoking status: Current Every Day Smoker    Packs/day: 1.00    Years: 40.00    Pack years: 40.00    Types: Cigarettes  . Smokeless tobacco: Current User  Substance Use Topics  . Alcohol use: No    Comment: occasionally  . Drug use: No    ALLERGIES:  has No Known Allergies.  MEDICATIONS:  Current Outpatient Medications  Medication Sig Dispense Refill  . abiraterone Acetate (ZYTIGA) 250 MG tablet Take 2 tablets (500 mg total) by mouth daily. Take on an empty stomach 1 hour before or 2 hours after a meal 60 tablet 4  . acetaminophen (TYLENOL) 500 MG tablet Take 1,000 mg by mouth every 8 (eight)  hours as needed.     Marland Kitchen albuterol (PROVENTIL HFA;VENTOLIN HFA) 108 (90 Base) MCG/ACT inhaler Inhale 1-2 puffs into the lungs every 6 (six) hours as needed for wheezing or shortness of breath. 1 Inhaler 0  . aspirin 81 MG tablet Take 81 mg by mouth daily.    Marland Kitchen atorvastatin (LIPITOR) 80 MG tablet Take 1 tablet (80 mg total) by mouth daily at 6 PM. 90 tablet 3  . clopidogrel (PLAVIX) 75 MG tablet Take 1 tablet (75 mg total) by mouth daily. 90 tablet 3  . fentaNYL (DURAGESIC - DOSED MCG/HR) 100 MCG/HR Place 1 patch (100 mcg total) onto the skin every 3 (three) days. 10 patch 0  . lisinopril (PRINIVIL,ZESTRIL) 2.5 MG tablet Take 1 tablet (2.5 mg total) by mouth daily. 30 tablet 3  . metoprolol tartrate (LOPRESSOR) 25 MG tablet Take 0.5 tablets (12.5 mg total) by mouth 2 (two) times daily. 30 tablet 3  . nitroGLYCERIN (NITROSTAT) 0.4 MG SL tablet Place 0.4 mg under the tongue every 5 (five) minutes as needed.     . pantoprazole (PROTONIX) 40 MG tablet Take 1 tablet (40 mg total) by mouth daily at 6 (six) AM. 30 tablet 0  . polyethylene glycol (MIRALAX / GLYCOLAX) packet Take 17 g by mouth daily as needed for moderate constipation.    . predniSONE (DELTASONE) 5 MG tablet TAKE 1 TABLET BY MOUTH 2 TIMES DAILY WITH A MEAL. TAKE DAILY WITH ZYTIGA 60 tablet 6  . Wheat Dextrin (BENEFIBER) POWD Stir 2 tsp. TID into 4-8 oz of any non-carbonated beverage or soft food (hot or cold) 500 g PRN  . HYDROcodone-acetaminophen (NORCO) 10-325 MG tablet Take 1 tablet by mouth every 6 (six) hours as needed. 60 tablet 0   No current facility-administered medications for this visit.     PHYSICAL EXAMINATION: ECOG PERFORMANCE STATUS: 1 - Symptomatic but completely ambulatory  BP (!) 145/87   Pulse 73   Temp (!) 97.4 F (36.3 C) (Tympanic)   Resp 16   Wt 184 lb 1.4 oz (83.5 kg)   BMI 26.41 kg/m   Filed Weights   01/09/17 1049 01/09/17 1054  Weight: 184 lb 1.4 oz (83.5 kg) 184 lb 1.4 oz (83.5 kg)   GENERAL:  Well-nourished well-developed; Alert, no distress and comfortable.Walks with a cane.He is accompanied by his wife. EYES: no pallor or icterus OROPHARYNX: no thrush or ulceration; Upper dentures. NECK: supple, no masses felt LYMPH: no palpable lymphadenopathy in the cervical, axillary or inguinal regions LUNGS: clear to auscultation and No wheeze or crackles HEART/CVS: regular rate & rhythm and no murmurs; No lower extremity edema ABDOMEN:abdomen soft, non-tender and normal bowel sounds; No hepatomegaly. Musculoskeletal:no cyanosis of digits and no clubbing; No significant tenderness noted. PSYCH: alert & oriented  x 3 with fluent speech NEURO: no focal motor/sensory deficits SKIN: no rashes or significant lesions   LABORATORY DATA:  I have reviewed the data as listed    Component Value Date/Time   NA 137 01/08/2017 1516   NA 140 07/13/2013 1542   K 4.2 01/08/2017 1516   K 4.3 07/13/2013 1542   CL 104 01/08/2017 1516   CL 107 07/13/2013 1542   CO2 26 01/08/2017 1516   CO2 26 07/13/2013 1542   GLUCOSE 128 (H) 01/08/2017 1516   GLUCOSE 106 (H) 07/13/2013 1542   BUN 16 01/08/2017 1516   BUN 11 07/13/2013 1542   CREATININE 0.79 01/08/2017 1516   CREATININE 1.01 07/13/2013 1542   CALCIUM 9.1 01/08/2017 1516   CALCIUM 9.9 07/13/2013 1542   PROT 7.0 01/08/2017 1516   PROT 8.1 07/13/2013 1542   ALBUMIN 4.2 01/08/2017 1516   ALBUMIN 4.3 07/13/2013 1542   AST 18 01/08/2017 1516   AST 26 07/13/2013 1542   ALT 13 (L) 01/08/2017 1516   ALT 32 07/13/2013 1542   ALKPHOS 42 01/08/2017 1516   ALKPHOS 74 07/13/2013 1542   BILITOT 0.5 01/08/2017 1516   BILITOT 0.4 07/13/2013 1542   GFRNONAA >60 01/08/2017 1516   GFRNONAA >60 07/13/2013 1542   GFRAA >60 01/08/2017 1516   GFRAA >60 07/13/2013 1542    No results found for: SPEP, UPEP  Lab Results  Component Value Date   WBC 8.5 01/08/2017   NEUTROABS 6.9 (H) 01/08/2017   HGB 13.2 01/08/2017   HCT 39.6 (L) 01/08/2017   MCV  97.1 01/08/2017   PLT 165 01/08/2017      Chemistry      Component Value Date/Time   NA 137 01/08/2017 1516   NA 140 07/13/2013 1542   K 4.2 01/08/2017 1516   K 4.3 07/13/2013 1542   CL 104 01/08/2017 1516   CL 107 07/13/2013 1542   CO2 26 01/08/2017 1516   CO2 26 07/13/2013 1542   BUN 16 01/08/2017 1516   BUN 11 07/13/2013 1542   CREATININE 0.79 01/08/2017 1516   CREATININE 1.01 07/13/2013 1542      Component Value Date/Time   CALCIUM 9.1 01/08/2017 1516   CALCIUM 9.9 07/13/2013 1542   ALKPHOS 42 01/08/2017 1516   ALKPHOS 74 07/13/2013 1542   AST 18 01/08/2017 1516   AST 26 07/13/2013 1542   ALT 13 (L) 01/08/2017 1516   ALT 32 07/13/2013 1542   BILITOT 0.5 01/08/2017 1516   BILITOT 0.4 07/13/2013 1542     Results for Bethune, Marcquis L (MRN [id number]) as of 01/09/2017 11:01  Ref. Range 07/15/2012 13:47 08/15/2012 10:21 12/05/2012 10:07 02/08/2015 09:52 04/27/2015 11:49 06/01/2015 11:35 07/14/2015 13:25 08/10/2015 14:03 10/05/2015 13:44 11/18/2015 13:35 01/04/2016 13:25 02/03/2016 13:51 03/06/2016 14:53 04/14/2016 10:40 05/26/2016 13:49 07/06/2016 15:20 08/17/2016 11:03 09/28/2016 13:50 11/09/2016 13:28 01/08/2017 15:16  PSA Latest Ref Range: 0.00 - 4.00 ng/mL 14.2 (H) 14.6 (H) 0.2 1,021.00 (H) 33.39 (H) 43.62 (H) 14.08 (H) 15.26 (H) 1.40 3.03 6.79 (H) 10.47 (H) 4.97 (H) 14.99 (H) 3.38       Prostatic Specific Antigen Latest Ref Range: 0.00 - 4.00 ng/mL                3.33 3.15 2.99 2.56 5.87 (H)        ASSESSMENT & PLAN:    Prostate cancer metastatic to bone ([place]) Castrate resistant prostate cancer-  Patient currently on Lupron q3M [last lupron nov 11th 2018]; on Zytiga- since  Aug 2017.  Jan 2018- bone scan- no significant active disease.  # Jan 2019- PSA 5.4/slightly increasing [currently on 2 pills- sec to intol/lightheadness; nausea].  Patient not too keen on going up on Zytiga to 4 pills a day.  Will likely need aux PET scan; however wait for the ultrasound as discussed below.  # RUQ pain- in  last few months- check [place] of liver 2-3 times/week with food.  Based upon the results-discussed that he might need evaluation with a surgeon.  # X-geva/ Lupron every 3 months [last Nov 2018 ]- on ca+vit D. No AEs noted. Ca- wnl.   # Back pain-chronic ? Worse;  fentanyl 100 g every 72 hours; continue switch to hydrcocond e 10/325 10 mg every 12 hours. New scripts given.   # Elevated Blood pressure-140s/80s improved.  # Hypokalemia mild 3.2- secondary to Zytiga. Continue supplementation.  # recent MI [April 2018; [place]]- s/p stenting; on Plavix; stable.  # follow up in 6 weeks- few days prior/lab-lupron; x-geva; [place] asap; if neg- aux PET scan.      [name], MD 01/09/2017 11:27 AM

## 2017-01-09 NOTE — Assessment & Plan Note (Addendum)
Castrate resistant prostate cancer-  Patient currently on Lupron q3M [last lupron nov 11th 2018]; on Zytiga- since Aug 2017.  Jan 2018- bone scan- no significant active disease.  # Jan 2019- PSA 5.4/slightly increasing [currently on 2 pills- sec to intol/lightheadness; nausea].  Patient not too keen on going up on Zytiga to 4 pills a day.  Will likely need aux PET scan; however wait for the ultrasound as discussed below.  # RUQ pain- in last few months- check [place] of liver 2-3 times/week with food.  Based upon the results-discussed that he might need evaluation with a surgeon.  # X-geva/ Lupron every 3 months [last Nov 2018 ]- on ca+vit D. No AEs noted. Ca- wnl.   # Back pain-chronic ? Worse;  fentanyl 100 g every 72 hours; continue switch to hydrcocond e 10/325 10 mg every 12 hours. New scripts given.   # Elevated Blood pressure-140s/80s improved.  # Hypokalemia mild 3.2- secondary to Zytiga. Continue supplementation.  # recent MI [April 2018; [place]]- s/p stenting; on Plavix; stable.  # follow up in 6 weeks- few days prior/lab-lupron; x-geva; [place] asap; if neg- aux PET scan.

## 2017-01-11 ENCOUNTER — Ambulatory Visit
Admission: RE | Admit: 2017-01-11 | Discharge: 2017-01-11 | Disposition: A | Discharge status: Home / Self Care | Payer: 59 | Source: Ambulatory Visit | Attending: Internal Medicine | Admitting: Internal Medicine

## 2017-01-11 ENCOUNTER — Other Ambulatory Visit: Payer: 59

## 2017-01-11 DIAGNOSIS — C7951 Secondary malignant neoplasm of bone: Secondary | ICD-10-CM | POA: Diagnosis present

## 2017-01-11 DIAGNOSIS — C61 Malignant neoplasm of prostate: Secondary | ICD-10-CM | POA: Diagnosis present

## 2017-01-11 DIAGNOSIS — R1011 Right upper quadrant pain: Secondary | ICD-10-CM | POA: Diagnosis not present

## 2017-01-12 ENCOUNTER — Telehealth: Payer: Self-pay | Admitting: Internal Medicine

## 2017-01-12 DIAGNOSIS — C61 Malignant neoplasm of prostate: Secondary | ICD-10-CM

## 2017-01-12 DIAGNOSIS — C7951 Secondary malignant neoplasm of bone: Principal | ICD-10-CM

## 2017-01-12 NOTE — Telephone Encounter (Signed)
Robert Short, per Dr. Rogue Bussing ok to have the pet scan as scheduled on 01/23/17. I will let the patient know the plan of care.

## 2017-01-12 NOTE — Telephone Encounter (Signed)
Spoke with patient. He is already aware of the test results, which he viewed on mychart. Patient informed to keep pet scan apt as scheduled. He thanked me for calling.

## 2017-01-12 NOTE — Telephone Encounter (Signed)
H/B- please inform pt/wife that the ultrasound did not show any evidence of gallbladder problem.  I would recommend PET scan asap for his prostate cancer.

## 2017-01-16 ENCOUNTER — Ambulatory Visit: Payer: 59 | Admitting: Internal Medicine

## 2017-01-23 ENCOUNTER — Ambulatory Visit
Admission: RE | Admit: 2017-01-23 | Discharge: 2017-01-23 | Disposition: A | Discharge status: Home / Self Care | Payer: 59 | Source: Ambulatory Visit | Attending: Internal Medicine | Admitting: Internal Medicine

## 2017-01-23 DIAGNOSIS — C61 Malignant neoplasm of prostate: Secondary | ICD-10-CM | POA: Insufficient documentation

## 2017-01-23 DIAGNOSIS — C7951 Secondary malignant neoplasm of bone: Secondary | ICD-10-CM | POA: Insufficient documentation

## 2017-01-23 MED ORDER — AXUMIN (FLUCICLOVINE F 18) INJECTION
10.0500 | Freq: Once | INTRAVENOUS | Status: AC
  Administered 2017-01-23: 10.05 via INTRAVENOUS

## 2017-01-25 ENCOUNTER — Telehealth: Payer: Self-pay | Admitting: Internal Medicine

## 2017-01-25 NOTE — Telephone Encounter (Signed)
Patient notified of these lab results and verbalized understanding. Patient states he has an upcoming appt on 02/20/17 - and he will just wait until that appt to discuss further with Dr. B.  He states that he will call us back if his symptoms worsen before then.

## 2017-01-25 NOTE — Telephone Encounter (Signed)
Please inform patient that PET scan shows-stable/treated bone lesions not active.   Ultrasound abdomen-does not show any gallstone disease.   I do not have an explanation of his worsening pain-if interested I could reevaluate him next week in [place]; and talk to him about the next plan. Please schedule appt with me in [place] next Tuesday; if he is interested.

## 2017-01-30 ENCOUNTER — Ambulatory Visit (INDEPENDENT_AMBULATORY_CARE_PROVIDER_SITE_OTHER): Payer: 59

## 2017-01-30 VITALS — BP 144/80 | HR 74 | Temp 98.3°F | Resp 16 | Ht 70.0 in | Wt 186.2 lb

## 2017-01-30 DIAGNOSIS — Z0000 Encounter for general adult medical examination without abnormal findings: Secondary | ICD-10-CM | POA: Diagnosis not present

## 2017-01-30 DIAGNOSIS — Z1159 Encounter for screening for other viral diseases: Secondary | ICD-10-CM

## 2017-01-30 NOTE — Patient Instructions (Addendum)
Robert Short , Thank you for taking time to come for your Medicare Wellness Visit. I appreciate your ongoing commitment to your health goals. Please review the following plan we discussed and let me know if I can assist you in the future.   Screening recommendations/referrals: Colonoscopy: completed 01/03/2011 Recommended yearly ophthalmology/optometry visit for glaucoma screening and checkup Recommended yearly dental visit for hygiene and checkup  Vaccinations: Influenza vaccine: up to date Pneumococcal vaccine: pneumovax 23 due 10/18/2017 Tdap vaccine: up to date Shingles vaccine: due, check with your insurance company for coverage   Advanced directives: Advance directive discussed with you today. I have provided a copy for you to complete at home and have notarized. Once this is complete please bring a copy in to our office so we can scan it into your chart.  Conditions/risks identified:  Recommend drinking 6-8 glasses of water a day . Smoking cessation discussed.  Next appointment: Follow up on 04/18/2017 at 2:20pm with Dr.Karamalegos. Follow up in one year for your annual wellness exam.   Preventive Care 65 Years and Older, Male Preventive care refers to lifestyle choices and visits with your health care provider that can promote health and wellness. What does preventive care include?  A yearly physical exam. This is also called an annual well check.  Dental exams once or twice a year.  Routine eye exams. Ask your health care provider how often you should have your eyes checked.  Personal lifestyle choices, including:  Daily care of your teeth and gums.  Regular physical activity.  Eating a healthy diet.  Avoiding tobacco and drug use.  Limiting alcohol use.  Practicing safe sex.  Taking low doses of aspirin every day.  Taking vitamin and mineral supplements as recommended by your health care provider. What happens during an annual well check? The services and  screenings done by your health care provider during your annual well check will depend on your age, overall health, lifestyle risk factors, and family history of disease. Counseling  Your health care provider may ask you questions about your:  Alcohol use.  Tobacco use.  Drug use.  Emotional well-being.  Home and relationship well-being.  Sexual activity.  Eating habits.  History of falls.  Memory and ability to understand (cognition).  Work and work Statistician. Screening  You may have the following tests or measurements:  Height, weight, and BMI.  Blood pressure.  Lipid and cholesterol levels. These may be checked every 5 years, or more frequently if you are over 77 years old.  Skin check.  Lung cancer screening. You may have this screening every year starting at age 72 if you have a 30-pack-year history of smoking and currently smoke or have quit within the past 15 years.  Fecal occult blood test (FOBT) of the stool. You may have this test every year starting at age 27.  Flexible sigmoidoscopy or colonoscopy. You may have a sigmoidoscopy every 5 years or a colonoscopy every 10 years starting at age 39.  Prostate cancer screening. Recommendations will vary depending on your family history and other risks.  Hepatitis C blood test.  Hepatitis B blood test.  Sexually transmitted disease (STD) testing.  Diabetes screening. This is done by checking your blood sugar (glucose) after you have not eaten for a while (fasting). You may have this done every 1-3 years.  Abdominal aortic aneurysm (AAA) screening. You may need this if you are a current or former smoker.  Osteoporosis. You may be screened starting at age  70 if you are at high risk. Talk with your health care provider about your test results, treatment options, and if necessary, the need for more tests. Vaccines  Your health care provider may recommend certain vaccines, such as:  Influenza vaccine. This is  recommended every year.  Tetanus, diphtheria, and acellular pertussis (Tdap, Td) vaccine. You may need a Td booster every 10 years.  Zoster vaccine. You may need this after age 16.  Pneumococcal 13-valent conjugate (PCV13) vaccine. One dose is recommended after age 2.  Pneumococcal polysaccharide (PPSV23) vaccine. One dose is recommended after age 24. Talk to your health care provider about which screenings and vaccines you need and how often you need them. This information is not intended to replace advice given to you by your health care provider. Make sure you discuss any questions you have with your health care provider. Document Released: 01/15/2015 Document Revised: 09/08/2015 Document Reviewed: 10/20/2014 Elsevier Interactive Patient Education  2017 [place] Prevention in the Home Falls can cause injuries. They can happen to people of all ages. There are many things you can do to make your home safe and to help prevent falls. What can I do on the outside of my home?  Regularly fix the edges of walkways and driveways and fix any cracks.  Remove anything that might make you trip as you walk through a door, such as a raised step or threshold.  Trim any bushes or trees on the path to your home.  Use bright outdoor lighting.  Clear any walking paths of anything that might make someone trip, such as rocks or tools.  Regularly check to see if handrails are loose or broken. Make sure that both sides of any steps have handrails.  Any raised decks and porches should have guardrails on the edges.  Have any leaves, snow, or ice cleared regularly.  Use sand or salt on walking paths during winter.  Clean up any spills in your garage right away. This includes oil or grease spills. What can I do in the bathroom?  Use night lights.  Install grab bars by the toilet and in the tub and shower. Do not use towel bars as grab bars.  Use non-skid mats or decals in the tub or  shower.  If you need to sit down in the shower, use a plastic, non-slip stool.  Keep the floor dry. Clean up any water that spills on the floor as soon as it happens.  Remove soap buildup in the tub or shower regularly.  Attach bath mats securely with double-sided non-slip rug tape.  Do not have throw rugs and other things on the floor that can make you trip. What can I do in the bedroom?  Use night lights.  Make sure that you have a light by your bed that is easy to reach.  Do not use any sheets or blankets that are too big for your bed. They should not hang down onto the floor.  Have a firm chair that has side arms. You can use this for support while you get dressed.  Do not have throw rugs and other things on the floor that can make you trip. What can I do in the kitchen?  Clean up any spills right away.  Avoid walking on wet floors.  Keep items that you use a lot in easy-to-reach places.  If you need to reach something above you, use a strong step stool that has a grab bar.  Keep  electrical cords out of the way.  Do not use floor polish or wax that makes floors slippery. If you must use wax, use non-skid floor wax.  Do not have throw rugs and other things on the floor that can make you trip. What can I do with my stairs?  Do not leave any items on the stairs.  Make sure that there are handrails on both sides of the stairs and use them. Fix handrails that are broken or loose. Make sure that handrails are as long as the stairways.  Check any carpeting to make sure that it is firmly attached to the stairs. Fix any carpet that is loose or worn.  Avoid having throw rugs at the top or bottom of the stairs. If you do have throw rugs, attach them to the floor with carpet tape.  Make sure that you have a light switch at the top of the stairs and the bottom of the stairs. If you do not have them, ask someone to add them for you. What else can I do to help prevent  falls?  Wear shoes that:  Do not have high heels.  Have rubber bottoms.  Are comfortable and fit you well.  Are closed at the toe. Do not wear sandals.  If you use a stepladder:  Make sure that it is fully opened. Do not climb a closed stepladder.  Make sure that both sides of the stepladder are locked into place.  Ask someone to hold it for you, if possible.  Clearly mark and make sure that you can see:  Any grab bars or handrails.  First and last steps.  Where the edge of each step is.  Use tools that help you move around (mobility aids) if they are needed. These include:  Canes.  Walkers.  Scooters.  Crutches.  Turn on the lights when you go into a dark area. Replace any light bulbs as soon as they burn out.  Set up your furniture so you have a clear path. Avoid moving your furniture around.  If any of your floors are uneven, fix them.  If there are any pets around you, be aware of where they are.  Review your medicines with your doctor. Some medicines can make you feel dizzy. This can increase your chance of falling. Ask your doctor what other things that you can do to help prevent falls. This information is not intended to replace advice given to you by your health care provider. Make sure you discuss any questions you have with your health care provider. Document Released: 10/15/2008 Document Revised: 05/27/2015 Document Reviewed: 01/23/2014 Elsevier Interactive Patient Education  2017 [employer].

## 2017-01-30 NOTE — Progress Notes (Signed)
Subjective:   Robert Short is a 67 y.o. male who presents for an Initial Medicare Annual Wellness Visit.  Review of Systems   Cardiac Risk Factors include: hypertension;advanced age (>46men, >55 women);male gender;dyslipidemia    Objective:    Today's Vitals   01/30/17 1417 01/30/17 1421  BP: (!) 144/80   Pulse: 74   Resp: 16   Temp: 98.3 F (36.8 C)   TempSrc: Oral   Weight: 186 lb 3.2 oz (84.5 kg)   Height: 5\' 10"  (1.778 m)   PainSc:  2    Body mass index is 26.72 kg/m.  Advanced Directives 01/30/2017 11/14/2016 10/03/2016 08/22/2016 08/02/2016 08/02/2016 07/11/2016  Does Patient Have a Medical Advance Directive? No No No No No No No  Would patient like information on creating a medical advance directive? Yes (MAU/Ambulatory/Procedural Areas - Information given) No - Patient declined No - Patient declined No - Patient declined - - No - Patient declined    Current Medications (verified) Outpatient Encounter Medications as of 01/30/2017  Medication Sig  . abiraterone Acetate (ZYTIGA) 250 MG tablet Take 2 tablets (500 mg total) by mouth daily. Take on an empty stomach 1 hour before or 2 hours after a meal  . acetaminophen (TYLENOL) 500 MG tablet Take 1,000 mg by mouth every 8 (eight) hours as needed.   Marland Kitchen albuterol (PROVENTIL HFA;VENTOLIN HFA) 108 (90 Base) MCG/ACT inhaler Inhale 1-2 puffs into the lungs every 6 (six) hours as needed for wheezing or shortness of breath.  Marland Kitchen aspirin 81 MG tablet Take 81 mg by mouth daily.  Marland Kitchen atorvastatin (LIPITOR) 80 MG tablet Take 1 tablet (80 mg total) by mouth daily at 6 PM.  . clopidogrel (PLAVIX) 75 MG tablet Take 1 tablet (75 mg total) by mouth daily.  . fentaNYL (DURAGESIC - DOSED MCG/HR) 100 MCG/HR Place 1 patch (100 mcg total) onto the skin every 3 (three) days.  Marland Kitchen HYDROcodone-acetaminophen (NORCO) 10-325 MG tablet Take 1 tablet by mouth every 6 (six) hours as needed.  Marland Kitchen lisinopril (PRINIVIL,ZESTRIL) 2.5 MG tablet Take 1 tablet (2.5 mg total)  by mouth daily.  . metoprolol tartrate (LOPRESSOR) 25 MG tablet Take 0.5 tablets (12.5 mg total) by mouth 2 (two) times daily.  . pantoprazole (PROTONIX) 40 MG tablet Take 1 tablet (40 mg total) by mouth daily at 6 (six) AM.  . polyethylene glycol (MIRALAX / GLYCOLAX) packet Take 17 g by mouth daily as needed for moderate constipation.  . predniSONE (DELTASONE) 5 MG tablet TAKE 1 TABLET BY MOUTH 2 TIMES DAILY WITH A MEAL. TAKE DAILY WITH ZYTIGA  . Wheat Dextrin (BENEFIBER) POWD Stir 2 tsp. TID into 4-8 oz of any non-carbonated beverage or soft food (hot or cold)  . nitroGLYCERIN (NITROSTAT) 0.4 MG SL tablet Place 0.4 mg under the tongue every 5 (five) minutes as needed.    No facility-administered encounter medications on file as of 01/30/2017.     Allergies (verified) Patient has no known allergies.   History: Past Medical History:  Diagnosis Date  . Back pain 10/09/2012  . Bone cancer ([place])   . CAD (coronary artery disease)    a. 08/2015 PCI: LM nl, LAD 20d, LCX nl, RCA 23m, RPAV 95 (3.0x18 Xience Alpine DES);  b. 04/2016 PCI [facility]): RPL 95 (2.75x8 Promus Premier DES).  . Cancer associated pain   . Depression   . History of echocardiogram    a. 08/2016 Echo: EF 50-55%.  Marland Kitchen History of kidney stones   . Hyperlipidemia   .  Hypertension   . Joint pain   . Prostate cancer [facility])    a.  s/p prostatectomy ([facility]);  b. bone mets noted 04/2016.  . Right arm pain 01/10/2016  . Right foot pain 01/10/2016  . Right leg pain 01/10/2016  . Tobacco abuse    Past Surgical History:  Procedure Laterality Date  . CARD[facility]AC CATHETERIZATION     [facility]  . CARDIAC CATHETERIZATION N/A 08/16/2015   Procedure: Left Heart Cath and Coronary Angiography;  Surgeon: Yolonda Kida, MD;  Location: [place] CV LAB;  Service: Cardiovascular;  Laterality: N/A;  . CARDIAC CATHETERIZATION N/A 08/16/2015   Procedure: Coronary Stent Intervention;  Surgeon: Yolonda Kida, MD;  Location: [place] CV LAB;   Service: Cardiovascular;  Laterality: N/A;  . CERVIX SURGERY    . PROSTATECTOMY    . SPINE SURGERY    . TONSILLECTOMY    . WRIST SURGERY     Family History  Problem Relation Age of Onset  . Heart attack Mother   . Hypertension Mother   . Heart attack Father    Social History   Socioeconomic History  . Marital status: Married    Spouse name: None  . Number of children: None  . Years of education: None  . Highest education level: None  Social Needs  . Financial resource strain: Not hard at all  . Food insecurity - worry: Never true  . Food insecurity - inability: Never true  . Transportation needs - medical: No  . Transportation needs - non-medical: No  Occupational History  . Occupation: Disabled    Comment: since 1999 - chronic back pain and DDD.  Tobacco Use  . Smoking status: Current Every Day Smoker    Packs/day: 1.00    Years: 40.00    Pack years: 40.00    Types: Cigarettes  . Smokeless tobacco: Current User  Substance and Sexual Activity  . Alcohol use: No    Comment: occasionally  . Drug use: No  . Sexual activity: None  Other Topics Concern  . None  Social History Narrative   Lives in [place] with wife.  Does not routinely exercise.   Tobacco Counseling Ready to quit: Yes Counseling given: Yes   Clinical Intake:  Pre-visit preparation completed: Yes  Pain : 0-10 Pain Score: 2  Pain Type: Chronic pain Pain Location: Generalized Pain Descriptors / Indicators: Aching Pain Onset: More than a month ago Pain Frequency: Constant     Nutritional Status: BMI 25 -29 Overweight Nutritional Risks: Nausea/ vomitting/ diarrhea(nausea due to chemotherapy) Diabetes: No  How often do you need to have someone help you when you read instructions, pamphlets, or other written materials from your doctor or pharmacy?: 1 - Never What is the last grade level you completed in school?: some college   Interpreter Needed?: No  Information entered by :: Daija Routson,LPN   Activities of Daily Living In your present state of health, do you have any difficulty performing the following activities: 01/30/2017 08/02/2016  Hearing? N N  Vision? N N  Difficulty concentrating or making decisions? Y N  Walking or climbing stairs? Y Y  Comment pain  -  Dressing or bathing? N N  Doing errands, shopping? N N  Preparing Food and eating ? N -  Using the Toilet? N -  In the past six months, have you accidently leaked urine? N -  Do you have problems with loss of bowel control? N -  Managing your Medications? Y -  Comment wife helps  -  Managing your Finances? N -  Housekeeping or managing your Housekeeping? N -  Some recent data might be hidden     Immunizations and Health Maintenance Immunization History  Administered Date(s) Administered  . Influenza Split 12/04/2011  . Influenza, High Dose Seasonal PF 10/18/2016  . Influenza,inj,Quad PF,6+ Mos 10/29/2012, 09/30/2014, 10/11/2015  . Influenza-Unspecified 09/30/2014, 10/11/2015  . Pneumococcal Conjugate-13 10/18/2016  . Tdap 08/26/2013   There are no preventive care reminders to display for this patient.  Patient Care Team: Olin Hauser, DO as PCP - General (Family Medicine) Minna Merritts, [name], [name]  Indicate any recent Medical Services you may have received from other than [facility] providers in the past year (date may be approximate).    Assessment:   This is a routine wellness examination for Jasean.  Hearing/Vision screen Vision Screening Comments: Goes to Dr.Nice annually  Dietary issues and exercise activities discussed: Current Exercise Habits: The patient does not participate in regular exercise at present, Exercise limited by: None identified  Goals    . DIET - INCREASE WATER INTAKE     Recommend drinking 6-8 glasses of water a day     . Quit Smoking     Smoking  cessation discussed      Depression Screen PHQ 2/9 Scores 01/30/2017 10/18/2016 05/04/2016 04/27/2016  PHQ - 2 Score 2 0 5 0  PHQ- 9 Score 9 - 20 -    Fall Risk Fall Risk  01/30/2017 10/18/2016 05/04/2016 04/27/2016 02/21/2016  Falls in the past year? No No No No No  Number falls in past yr: - - - - -  Injury with Fall? - - - - -  Risk for fall due to : - - Medication side effect - -    Is the patient's home free of loose throw rugs in walkways, pet beds, electrical cords, etc?   yes      Grab bars in the bathroom? no      Handrails on the stairs?   yes      Adequate lighting?   yes  Timed Get Up and Go performed: Completed in 9 seconds with use of assistive devices- Cane, steady gait. No intervention needed at this time.   Cognitive Function:      6CIT Screen 01/30/2017  What Year? 0 points  What month? 0 points  What time? 0 points  Count back from 20 0 points  Months in reverse 0 points  Repeat phrase 0 points  Total Score 0    Screening Tests Health Maintenance  Topic Date Due  . Hepatitis C Screening  10/02/2017 (Originally March 25, 1950)  . PNA vac Low Risk Adult (2 of 2 - PPSV23) 10/18/2017  . COLONOSCOPY  01/02/2021  . TETANUS/TDAP  08/27/2023  . INFLUENZA VACCINE  Completed    Qualifies for Shingles Vaccine? Discussed shingrix vaccine   Cancer Screenings: Lung: Low Dose CT Chest recommended if Age 51-80 years, 30 pack-year currently smoking OR have quit w/in 15years. Patient does qualify. Had pet scan on 01/23/2017 Colorectal: completed 01/03/2011  Additional Screenings:  Hepatitis B/HIV/Syphillis: not indicated  Hepatitis C Screening: will order for next visit.      Plan:    I have personally reviewed and addressed the Medicare Annual Wellness questionnaire and have noted the following in the patient's chart:  A. Medical and social history B. Use of alcohol, tobacco or illicit drugs  C. Current medications and supplements D. Functional ability and status E.    Nutritional status F.  Physical activity G. Advance directives H. List of other physicians I.  Hospitalizations, surgeries, and ER visits in previous 12 months J.  [place] such as hearing and vision if needed, cognitive and depression L. Referrals and appointments   In addition, I have reviewed and discussed with patient certain preventive protocols, quality metrics, and best practice recommendations. A written personalized care plan for preventive services as well as general preventive health recommendations were provided to patient.   Signed,  Tyler Aas, LPN Nurse Health Advisor   Nurse Notes: Scored 8 on depression screening. Patient states contributes to his cancer and pain. Has a counselor that he can call if needed.

## 2017-01-31 ENCOUNTER — Other Ambulatory Visit: Payer: Self-pay | Admitting: Internal Medicine

## 2017-01-31 DIAGNOSIS — C61 Malignant neoplasm of prostate: Secondary | ICD-10-CM

## 2017-01-31 DIAGNOSIS — C7951 Secondary malignant neoplasm of bone: Principal | ICD-10-CM

## 2017-02-01 MED FILL — predniSONE 5 MG TABS: 5 | 30 days supply | Qty: 60 | Fill #4

## 2017-02-01 MED FILL — ABIRATERONE ACETATE 250 MG: 250 | 30 days supply | Qty: 60 | Fill #0

## 2017-02-06 ENCOUNTER — Other Ambulatory Visit: Payer: Self-pay | Admitting: Internal Medicine

## 2017-02-06 DIAGNOSIS — G893 Neoplasm related pain (acute) (chronic): Secondary | ICD-10-CM

## 2017-02-06 DIAGNOSIS — C61 Malignant neoplasm of prostate: Secondary | ICD-10-CM

## 2017-02-06 DIAGNOSIS — C7951 Secondary malignant neoplasm of bone: Principal | ICD-10-CM

## 2017-02-07 ENCOUNTER — Other Ambulatory Visit: Payer: Self-pay | Admitting: Nurse Practitioner

## 2017-02-07 DIAGNOSIS — G893 Neoplasm related pain (acute) (chronic): Secondary | ICD-10-CM

## 2017-02-07 DIAGNOSIS — C7951 Secondary malignant neoplasm of bone: Principal | ICD-10-CM

## 2017-02-07 DIAGNOSIS — C61 Malignant neoplasm of prostate: Secondary | ICD-10-CM

## 2017-02-07 MED ORDER — HYDROCODONE-ACETAMINOPHEN 10-325 MG PO TABS: 1.0000 | ORAL_TABLET | Freq: Four times a day (QID) | ORAL | 0 refills | Status: DC | PRN

## 2017-02-07 MED ORDER — FENTANYL 100 MCG/HR TD PT72: 100.0000 ug | MEDICATED_PATCH | TRANSDERMAL | 0 refills | Status: DC

## 2017-02-12 ENCOUNTER — Other Ambulatory Visit: Payer: Self-pay | Admitting: *Deleted

## 2017-02-12 DIAGNOSIS — C61 Malignant neoplasm of prostate: Secondary | ICD-10-CM

## 2017-02-12 DIAGNOSIS — C7951 Secondary malignant neoplasm of bone: Principal | ICD-10-CM

## 2017-02-16 ENCOUNTER — Inpatient Hospital Stay: Payer: 59 | Attending: Internal Medicine

## 2017-02-16 ENCOUNTER — Other Ambulatory Visit
Admission: RE | Admit: 2017-02-16 | Discharge: 2017-02-16 | Disposition: A | Discharge status: Home / Self Care | Payer: 59 | Source: Ambulatory Visit | Attending: Family Medicine | Admitting: Family Medicine

## 2017-02-16 DIAGNOSIS — Z7982 Long term (current) use of aspirin: Secondary | ICD-10-CM | POA: Diagnosis not present

## 2017-02-16 DIAGNOSIS — Z1159 Encounter for screening for other viral diseases: Secondary | ICD-10-CM

## 2017-02-16 DIAGNOSIS — E785 Hyperlipidemia, unspecified: Secondary | ICD-10-CM | POA: Diagnosis not present

## 2017-02-16 DIAGNOSIS — F17210 Nicotine dependence, cigarettes, uncomplicated: Secondary | ICD-10-CM | POA: Diagnosis not present

## 2017-02-16 DIAGNOSIS — I2510 Atherosclerotic heart disease of native coronary artery without angina pectoris: Secondary | ICD-10-CM | POA: Insufficient documentation

## 2017-02-16 DIAGNOSIS — F329 Major depressive disorder, single episode, unspecified: Secondary | ICD-10-CM | POA: Insufficient documentation

## 2017-02-16 DIAGNOSIS — Z87442 Personal history of urinary calculi: Secondary | ICD-10-CM | POA: Insufficient documentation

## 2017-02-16 DIAGNOSIS — C61 Malignant neoplasm of prostate: Secondary | ICD-10-CM | POA: Insufficient documentation

## 2017-02-16 DIAGNOSIS — Z9079 Acquired absence of other genital organ(s): Secondary | ICD-10-CM | POA: Insufficient documentation

## 2017-02-16 DIAGNOSIS — I10 Essential (primary) hypertension: Secondary | ICD-10-CM | POA: Diagnosis not present

## 2017-02-16 DIAGNOSIS — Z79818 Long term (current) use of other agents affecting estrogen receptors and estrogen levels: Secondary | ICD-10-CM | POA: Diagnosis not present

## 2017-02-16 DIAGNOSIS — I252 Old myocardial infarction: Secondary | ICD-10-CM | POA: Diagnosis not present

## 2017-02-16 DIAGNOSIS — Z79899 Other long term (current) drug therapy: Secondary | ICD-10-CM | POA: Insufficient documentation

## 2017-02-16 DIAGNOSIS — Z7902 Long term (current) use of antithrombotics/antiplatelets: Secondary | ICD-10-CM | POA: Insufficient documentation

## 2017-02-16 DIAGNOSIS — Z955 Presence of coronary angioplasty implant and graft: Secondary | ICD-10-CM | POA: Diagnosis not present

## 2017-02-16 DIAGNOSIS — C7951 Secondary malignant neoplasm of bone: Secondary | ICD-10-CM | POA: Insufficient documentation

## 2017-02-16 LAB — COMPREHENSIVE METABOLIC PANEL
ALBUMIN: 4.2 g/dL (ref 3.5–5.0)
ALT: 14 U/L — ABNORMAL LOW (ref 17–63)
ANION GAP: 7 (ref 5–15)
AST: 19 U/L (ref 15–41)
Alkaline Phosphatase: 43 U/L (ref 38–126)
BILIRUBIN TOTAL: 0.9 mg/dL (ref 0.3–1.2)
BUN: 14 mg/dL (ref 6–20)
CHLORIDE: 104 mmol/L (ref 101–111)
CO2: 28 mmol/L (ref 22–32)
Calcium: 9.2 mg/dL (ref 8.9–10.3)
Creatinine, Ser: 0.79 mg/dL (ref 0.61–1.24)
GFR calc Af Amer: >60 mL/min (ref >60)
GFR calc non Af Amer: >60 mL/min (ref >60)
GLUCOSE: 113 mg/dL — AB (ref 65–99)
POTASSIUM: 3.9 mmol/L (ref 3.5–5.1)
SODIUM: 139 mmol/L (ref 135–145)
TOTAL PROTEIN: 7 g/dL (ref 6.5–8.1)

## 2017-02-16 LAB — CBC WITH DIFFERENTIAL/PLATELET
BASOS ABS: 0 10*3/uL (ref 0–0.1)
Basophils Relative: 0 %
EOS PCT: 2 %
Eosinophils Absolute: 0.1 10*3/uL (ref 0–0.7)
HEMATOCRIT: 39 % — AB (ref 40.0–52.0)
Hemoglobin: 13.5 g/dL (ref 13.0–18.0)
LYMPHS ABS: 1.1 10*3/uL (ref 1.0–3.6)
LYMPHS PCT: 20 %
MCH: 32.7 pg (ref 26.0–34.0)
MCHC: 34.5 g/dL (ref 32.0–36.0)
MCV: 94.8 fL (ref 80.0–100.0)
MONO ABS: 0.5 10*3/uL (ref 0.2–1.0)
MONOS PCT: 9 %
NEUTROS ABS: 3.7 10*3/uL (ref 1.4–6.5)
Neutrophils Relative %: 69 %
PLATELETS: 171 10*3/uL (ref 150–440)
RBC: 4.12 MIL/uL — ABNORMAL LOW (ref 4.40–5.90)
RDW: 14.1 % (ref 11.5–14.5)
WBC: 5.4 10*3/uL (ref 3.8–10.6)

## 2017-02-16 LAB — PSA: Prostatic Specific Antigen: 5.35 ng/mL — ABNORMAL HIGH (ref 0.00–4.00)

## 2017-02-17 LAB — HEPATITIS C ANTIBODY: HCV AB: 0.1 {s_co_ratio} (ref 0.0–0.9)

## 2017-02-20 ENCOUNTER — Other Ambulatory Visit: Payer: Self-pay | Admitting: Cardiovascular Disease

## 2017-02-20 ENCOUNTER — Inpatient Hospital Stay: Payer: 59

## 2017-02-20 ENCOUNTER — Encounter: Payer: Self-pay | Admitting: Internal Medicine

## 2017-02-20 ENCOUNTER — Inpatient Hospital Stay (HOSPITAL_BASED_OUTPATIENT_CLINIC_OR_DEPARTMENT_OTHER): Payer: 59 | Admitting: Internal Medicine

## 2017-02-20 VITALS — BP 117/74 | HR 85 | Resp 16 | Wt 184.0 lb

## 2017-02-20 DIAGNOSIS — F17210 Nicotine dependence, cigarettes, uncomplicated: Secondary | ICD-10-CM | POA: Diagnosis not present

## 2017-02-20 DIAGNOSIS — I252 Old myocardial infarction: Secondary | ICD-10-CM | POA: Diagnosis not present

## 2017-02-20 DIAGNOSIS — Z7902 Long term (current) use of antithrombotics/antiplatelets: Secondary | ICD-10-CM | POA: Diagnosis not present

## 2017-02-20 DIAGNOSIS — E785 Hyperlipidemia, unspecified: Secondary | ICD-10-CM | POA: Diagnosis not present

## 2017-02-20 DIAGNOSIS — C7951 Secondary malignant neoplasm of bone: Secondary | ICD-10-CM | POA: Diagnosis not present

## 2017-02-20 DIAGNOSIS — Z79899 Other long term (current) drug therapy: Secondary | ICD-10-CM

## 2017-02-20 DIAGNOSIS — Z79818 Long term (current) use of other agents affecting estrogen receptors and estrogen levels: Secondary | ICD-10-CM | POA: Diagnosis not present

## 2017-02-20 DIAGNOSIS — C61 Malignant neoplasm of prostate: Secondary | ICD-10-CM

## 2017-02-20 DIAGNOSIS — I10 Essential (primary) hypertension: Secondary | ICD-10-CM | POA: Diagnosis not present

## 2017-02-20 DIAGNOSIS — R110 Nausea: Secondary | ICD-10-CM

## 2017-02-20 MED ORDER — OXYBUTYNIN CHLORIDE 5 MG PO TABS: 5.0000 mg | ORAL_TABLET | Freq: Two times a day (BID) | ORAL | 4 refills | Status: DC

## 2017-02-20 MED ORDER — PROMETHAZINE HCL 12.5 MG PO TABS: 12.5000 mg | ORAL_TABLET | Freq: Three times a day (TID) | ORAL | 0 refills | Status: DC | PRN

## 2017-02-20 MED ORDER — DENOSUMAB 120 MG/1.7ML ~~LOC~~ SOLN
120.0000 mg | Freq: Once | SUBCUTANEOUS | Status: AC
  Administered 2017-02-20: 120 mg via SUBCUTANEOUS

## 2017-02-20 MED ORDER — PROMETHAZINE HCL 12.5 MG PO TABS: 12.5000 mg | ORAL_TABLET | Freq: Three times a day (TID) | ORAL | 1 refills | Status: DC | PRN

## 2017-02-20 MED ORDER — LEUPROLIDE ACETATE (3 MONTH) 22.5 MG IM KIT
22.5000 mg | PACK | Freq: Once | INTRAMUSCULAR | Status: AC
  Administered 2017-02-20: 22.5 mg via INTRAMUSCULAR

## 2017-02-20 NOTE — Assessment & Plan Note (Addendum)
Castrate resistant prostate cancer-  Patient currently on Lupron q3M [last lupron nov 11th 2018]; on Zytiga- since Aug 2017.  Jan 2018- bone scan- no significant active disease.  January 2019 aux PET scan shows no significant active disease/which was treated bone metastases.   # Feb 2019- PSA 5.35/slightly better [currently on 2 pills- sec to intol/lightheadness; nausea].  Long discussion with the patient regarding trial of Xtandi-if he is not tolerating Zytiga well.  At the end the discussion patient feels that he would want to continue Zytiga at this time.  # hot flashes-from low testosterone state.  Recommend Ditropan 5 twice daily  # X-geva/ Lupron every 3 months [last Nov 2018 ]- on ca+vit D. No AEs noted. Ca- wnl.   # Back pain-chronic ? Worse;  fentanyl 100 g every 72 hours; continue switch to hydrcocond e 10/325 10 mg every 12 hours.  # recent MI [April 2018; [place]]- s/p stenting; on Plavix; stable.  # follow up with 6 weeks/ x-geva/labs- PSA- few days prior.

## 2017-02-20 NOTE — Progress Notes (Signed)
[place] OFFICE PROGRESS NOTE  Patient Care Team: Robert Hauser, DO as PCP - General (Family Medicine) Robert Situ [name], [name], [name]   SUMMARY OF ONCOLOGIC HISTORY: Oncology History   # 2011- PROSTATE CANCER [Gleason 3+4]; s/p Prostatectomy [ also involved bladder neck/ECP; Dr.Polaseck]; July 2014- Biochemical recurrence [PSA 14]- started on Zoladex [Dr.Pandit]; lost to follow up.  # JAN 2017- STAGE IV METASTATIC PROSTATE Cancer to Bone- Feb 13th, 2017-  Lupron q 57m [~end of feb]; PSA: 1021; Declined Chemo; April 2017 [xofigo x6; Dr.Crystal]; AUG 2017- Zytiga + Prednisone BID. Bone scan-Jan 2018- improved skeletal metastases.   # Mets to bone- start X-geva q 88M [May 30th]  # Smoker/ chronic pain/pain clinic      Prostate cancer metastatic to bone ([place])   12/08/2014 Initial Diagnosis    Prostate cancer metastatic to bone [facility])       INTERVAL HISTORY:   [name]  With above history of prostate cancer metastatic castrate resistant currently on Lupron + Zytiga is here for follow-up/also reviewed the results of the PET scan.  Patient complains of significant hot flashes that has been interrupting his sleep/daily activities.   Also complains of nausea especially after eating.  This has been improved after taking Phenergan.  He does complain of fatigue.  Continues to take narcotic pain medication for his chronic back pain-including fentanyl/hydrocodone.   Otherwise no swelling of the legs. No nausea no vomiting. He denies any jaw pain.  REVIEW OF SYSTEMS:  A complete 10 point review of system is done which is negative except mentioned above/history of present illness.   PAST MEDICAL HISTORY :  Past Medical History:  Diagnosis Date  . Back pain 10/09/2012  . Bone cancer ([place])   . CAD (coronary artery disease)    a. 08/2015  PCI: LM nl, LAD 20d, LCX nl, RCA 34m, RPAV 95 (3.0x18 Xience Alpine DES);  b. 04/2016 PCI [facility]): RPL 95 (2.75x8 Promus Premier DES).  . Cancer associated pain   . Depression   . History of echocardiogram    a. 08/2016 Echo: EF 50-55%.  Marland Kitchen History of kidney stones   . Hyperlipidemia   . Hypertension   . Joint pain   . Prostate cancer [facility])    a.  s/p prostatectomy ([facility]);  b. bone mets noted 04/2016.  . Right arm pain 01/10/2016  . Right foot pain 01/10/2016  . Right leg pain 01/10/2016  . Tobacco abuse     PAST SURGICAL HISTORY :   Past Surgical History:  Procedure Laterality Date  . [facility]     [facility]  . CARDIAC CATHETERIZATION N/A 08/16/2015   Procedure: Left Heart Cath and Coronary Angiography;  Surgeon: Robert Kida, MD;  Location: [place] CV LAB;  Service: Cardiovascular;  Laterality: N/A;  . CARDIAC CATHETERIZATION N/A 08/16/2015   Procedure: Coronary Stent Intervention;  Surgeon: Robert Kida, MD;  Location: [place] CV LAB;  Service: Cardiovascular;  Laterality: N/A;  . CERVIX SURGERY    . PROSTATECTOMY    . SPINE SURGERY    . TONSILLECTOMY    . WRIST SURGERY      FAMILY HISTORY :   Family History  Problem Relation Age of Onset  . Heart attack Mother   . Hypertension Mother   . Heart attack Father     SOCIAL HISTORY:   Social History   Tobacco  Use  . Smoking status: Current Every Day Smoker    Packs/day: 1.00    Years: 40.00    Pack years: 40.00    Types: Cigarettes  . Smokeless tobacco: Current User  Substance Use Topics  . Alcohol use: No    Comment: occasionally  . Drug use: No    ALLERGIES:  has No Known Allergies.  MEDICATIONS:  Current Outpatient Medications  Medication Sig Dispense Refill  . acetaminophen (TYLENOL) 500 MG tablet Take 1,000 mg by mouth every 8 (eight) hours as needed.     Marland Kitchen albuterol (PROVENTIL HFA;VENTOLIN HFA) 108 (90 Base) MCG/ACT inhaler Inhale 1-2 puffs into the lungs every 6 (six)  hours as needed for wheezing or shortness of breath. 1 Inhaler 0  . aspirin 81 MG tablet Take 81 mg by mouth daily.    Marland Kitchen atorvastatin (LIPITOR) 80 MG tablet Take 1 tablet (80 mg total) by mouth daily at 6 PM. 90 tablet 3  . clopidogrel (PLAVIX) 75 MG tablet Take 1 tablet (75 mg total) by mouth daily. 90 tablet 3  . fentaNYL (DURAGESIC - DOSED MCG/HR) 100 MCG/HR Place 1 patch (100 mcg total) onto the skin every 3 (three) days. 10 patch 0  . HYDROcodone-acetaminophen (NORCO) 10-325 MG tablet Take 1 tablet by mouth every 6 (six) hours as needed. 60 tablet 0  . lisinopril (PRINIVIL,ZESTRIL) 2.5 MG tablet Take 1 tablet (2.5 mg total) by mouth daily. 30 tablet 3  . metoprolol tartrate (LOPRESSOR) 25 MG tablet Take 0.5 tablets (12.5 mg total) by mouth 2 (two) times daily. 30 tablet 3  . nitroGLYCERIN (NITROSTAT) 0.4 MG SL tablet Place 0.4 mg under the tongue every 5 (five) minutes as needed.     . pantoprazole (PROTONIX) 40 MG tablet Take 1 tablet (40 mg total) by mouth daily at 6 (six) AM. 30 tablet 0  . polyethylene glycol (MIRALAX / GLYCOLAX) packet Take 17 g by mouth daily as needed for moderate constipation.    . predniSONE (DELTASONE) 5 MG tablet TAKE 1 TABLET BY MOUTH 2 TIMES DAILY WITH A MEAL. TAKE DAILY WITH ZYTIGA 60 tablet 6  . Wheat Dextrin (BENEFIBER) POWD Stir 2 tsp. TID into 4-8 oz of any non-carbonated beverage or soft food (hot or cold) 500 g PRN  . ZYTIGA 250 MG tablet TAKE 2 TABLETS (500 MG TOTAL) BY MOUTH DAILY. TAKE ON AN EMPTY STOMACH 1 HOUR BEFORE OR 2 HOURS AFTER A MEAL 60 tablet 4  . oxybutynin (DITROPAN) 5 MG tablet Take 1 tablet (5 mg total) by mouth 2 (two) times daily. 60 tablet 4  . promethazine (PHENERGAN) 12.5 MG tablet Take 1-2 tablets (12.5-25 mg total) by mouth every 8 (eight) hours as needed for nausea or vomiting. 60 tablet 1   No current facility-administered medications for this visit.     PHYSICAL EXAMINATION: ECOG PERFORMANCE STATUS: 1 - Symptomatic but  completely ambulatory  BP 117/74 (BP Location: Right Arm, Patient Position: Sitting)   Pulse 85   Resp 16   Wt 184 lb (83.5 kg)   SpO2 100%   BMI 26.40 kg/m   Filed Weights   02/20/17 1417 02/20/17 1419  Weight: 184 lb 3.1 oz (83.6 kg) 184 lb (83.5 kg)   GENERAL: Well-nourished well-developed; Alert, no distress and comfortable.Walks with a cane.He is accompanied by his wife. EYES: no pallor or icterus OROPHARYNX: no thrush or ulceration; Upper dentures. NECK: supple, no masses felt LYMPH: no palpable lymphadenopathy in the cervical, axillary or inguinal regions LUNGS:  clear to auscultation and No wheeze or crackles HEART/CVS: regular rate & rhythm and no murmurs; No lower extremity edema ABDOMEN:abdomen soft, non-tender and normal bowel sounds; No hepatomegaly. Musculoskeletal:no cyanosis of digits and no clubbing; No significant tenderness noted. PSYCH: alert & oriented x 3 with fluent speech NEURO: no focal motor/sensory deficits SKIN: no rashes or significant lesions   LABORATORY DATA:  I have reviewed the data as listed    Component Value Date/Time   NA 139 02/16/2017 1403   NA 140 07/13/2013 1542   K 3.9 02/16/2017 1403   K 4.3 07/13/2013 1542   CL 104 02/16/2017 1403   CL 107 07/13/2013 1542   CO2 28 02/16/2017 1403   CO2 26 07/13/2013 1542   GLUCOSE 113 (H) 02/16/2017 1403   GLUCOSE 106 (H) 07/13/2013 1542   BUN 14 02/16/2017 1403   BUN 11 07/13/2013 1542   CREATININE 0.79 02/16/2017 1403   CREATININE 1.01 07/13/2013 1542   CALCIUM 9.2 02/16/2017 1403   CALCIUM 9.9 07/13/2013 1542   PROT 7.0 02/16/2017 1403   PROT 8.1 07/13/2013 1542   ALBUMIN 4.2 02/16/2017 1403   ALBUMIN 4.3 07/13/2013 1542   AST 19 02/16/2017 1403   AST 26 07/13/2013 1542   ALT 14 (L) 02/16/2017 1403   ALT 32 07/13/2013 1542   ALKPHOS 43 02/16/2017 1403   ALKPHOS 74 07/13/2013 1542   BILITOT 0.9 02/16/2017 1403   BILITOT 0.4 07/13/2013 1542   GFRNONAA >60 02/16/2017 1403    GFRNONAA >60 07/13/2013 1542   GFRAA >60 02/16/2017 1403   GFRAA >60 07/13/2013 1542    No results found for: SPEP, UPEP  Lab Results  Component Value Date   WBC 5.4 02/16/2017   NEUTROABS 3.7 02/16/2017   HGB 13.5 02/16/2017   HCT 39.0 (L) 02/16/2017   MCV 94.8 02/16/2017   PLT 171 02/16/2017      Chemistry      Component Value Date/Time   NA 139 02/16/2017 1403   NA 140 07/13/2013 1542   K 3.9 02/16/2017 1403   K 4.3 07/13/2013 1542   CL 104 02/16/2017 1403   CL 107 07/13/2013 1542   CO2 28 02/16/2017 1403   CO2 26 07/13/2013 1542   BUN 14 02/16/2017 1403   BUN 11 07/13/2013 1542   CREATININE 0.79 02/16/2017 1403   CREATININE 1.01 07/13/2013 1542      Component Value Date/Time   CALCIUM 9.2 02/16/2017 1403   CALCIUM 9.9 07/13/2013 1542   ALKPHOS 43 02/16/2017 1403   ALKPHOS 74 07/13/2013 1542   AST 19 02/16/2017 1403   AST 26 07/13/2013 1542   ALT 14 (L) 02/16/2017 1403   ALT 32 07/13/2013 1542   BILITOT 0.9 02/16/2017 1403   BILITOT 0.4 07/13/2013 1542     Results for Rolfe, Marshun L (MRN [id number]) as of 02/20/2017 14:36  Ref. Range 08/17/2016 11:03 09/28/2016 13:50 11/09/2016 13:28 01/08/2017 15:16 02/16/2017 14:03  Prostatic Specific Antigen Latest Ref Range: 0.00 - 4.00 ng/mL 3.15 2.99 2.56 5.87 (H) 5.35 (H)   IMPRESSION: 1. No evidence of local prostate cancer recurrence within the prostate bed. 2. No evidence of metastatic nodal disease in the pelvis or abdomen. 3. No evidence distant disease. 4. No evidence of active skeletal metastasis. Widespread metastatic sclerotic skeletal lesions without associated radiotracer activity are favored treated metastasis.   Electronically Signed   By: Suzy Bouchard M.D.   On: 01/23/2017 15:03   ASSESSMENT & PLAN:    Prostate cancer  metastatic to bone ([place]) Castrate resistant prostate cancer-  Patient currently on Lupron q3M [last lupron nov 11th 2018]; on Zytiga- since Aug 2017.  Jan 2018- bone scan- no  significant active disease.  January 2019 aux PET scan shows no significant active disease/which was treated bone metastases.   # Feb 2019- PSA 5.35/slightly better [currently on 2 pills- sec to intol/lightheadness; nausea].  Long discussion with the patient regarding trial of Xtandi-if he is not tolerating Zytiga well.  At the end the discussion patient feels that he would want to continue Zytiga at this time.  # hot flashes-from low testosterone state.  Recommend Ditropan 5 twice daily  # X-geva/ Lupron every 3 months [last Nov 2018 ]- on ca+vit D. No AEs noted. Ca- wnl.   # Back pain-chronic ? Worse;  fentanyl 100 g every 72 hours; continue switch to hydrcocond e 10/325 10 mg every 12 hours.  # recent MI [April 2018; [place]]- s/p stenting; on Plavix; stable.  # follow up with 6 weeks/ x-geva/labs- PSA- few days prior.     [name], MD 02/20/2017 4:21 PM

## 2017-02-23 ENCOUNTER — Other Ambulatory Visit: Payer: Self-pay | Admitting: Internal Medicine

## 2017-02-26 MED FILL — predniSONE 5 MG TABS: 5 | 30 days supply | Qty: 60 | Fill #0

## 2017-02-27 ENCOUNTER — Telehealth: Payer: Self-pay | Admitting: Internal Medicine

## 2017-02-27 NOTE — Telephone Encounter (Addendum)
Oral Oncology Patient Advocate Encounter  Received notification from [facility] that prior authorization for ZYTIGA is required.  PA submitted on CoverMyMeds Key Over the phone PA. [phone] PA # [phone] Status is pending  Patient is on Abiraterone now and we need to change to brand [facility] for copay help. Copay card only pays for [facility].  Oral Oncology Clinic will continue to follow.   [place] Patient Advocate [phone] 02/27/2017 11:39 AM

## 2017-03-01 ENCOUNTER — Telehealth: Payer: Self-pay | Admitting: Internal Medicine

## 2017-03-01 MED FILL — ZYTIGA 250 MG TABLET: 250 | 30 days supply | Qty: 60 | Fill #1

## 2017-03-01 NOTE — Telephone Encounter (Signed)
Oral Oncology Patient Advocate Encounter  Called [facility] to check on patients PA for his Zytiga. It is still pending. She said to allow up to 72 hrs.   [place] Patient Advocate [phone] 03/01/2017 3:00 PM

## 2017-03-02 ENCOUNTER — Telehealth: Payer: Self-pay | Admitting: Internal Medicine

## 2017-03-02 NOTE — Telephone Encounter (Signed)
Oral Oncology Patient Advocate Encounter  Reviewed an e-mail from [facility] that they got the approval for patients Zytiga and was sent out to [place] this morning.   [place] Patient Advocate [phone] 03/02/2017 8:04 AM

## 2017-03-06 ENCOUNTER — Other Ambulatory Visit: Payer: Self-pay | Admitting: Nurse Practitioner

## 2017-03-06 DIAGNOSIS — G893 Neoplasm related pain (acute) (chronic): Secondary | ICD-10-CM

## 2017-03-06 DIAGNOSIS — C7951 Secondary malignant neoplasm of bone: Principal | ICD-10-CM

## 2017-03-06 DIAGNOSIS — C61 Malignant neoplasm of prostate: Secondary | ICD-10-CM

## 2017-03-06 MED ORDER — HYDROCODONE-ACETAMINOPHEN 10-325 MG PO TABS: 1.0000 | ORAL_TABLET | Freq: Four times a day (QID) | ORAL | 0 refills | Status: DC | PRN

## 2017-03-06 MED ORDER — FENTANYL 100 MCG/HR TD PT72: 100.0000 ug | MEDICATED_PATCH | TRANSDERMAL | 0 refills | Status: DC

## 2017-03-06 NOTE — Telephone Encounter (Signed)
Patient called [facility] requesting refill of Fentanyl and Norco.   As mandated by the [place] STOP Act (Strengthen Opioid Misuse Prevention), the [place] Controlled Substance Reporting System ([place]) was reviewed for this patient.  Below is the past 43-months of controlled substance prescriptions as displayed by the registry.  I have personally consulted with my supervising physician, Dr. Rogue Bussing, who agrees that continuation of opiate therapy is medically appropriate at this time and agrees to provide continual monitoring, including urine/blood drug screens, as indicated. Prescription sent electronically using [employer] secure transmission to requested pharmacy.   [place] Reviewed:     Beckey Rutter, DNP, AGNP-C [place] at [facility] [phone] (office) 03/06/17 11:06 AM

## 2017-03-06 NOTE — Telephone Encounter (Signed)
This came to [facility] in error.

## 2017-03-07 ENCOUNTER — Telehealth: Payer: Self-pay | Admitting: Pharmacist

## 2017-03-07 DIAGNOSIS — C7951 Secondary malignant neoplasm of bone: Principal | ICD-10-CM

## 2017-03-07 DIAGNOSIS — C61 Malignant neoplasm of prostate: Secondary | ICD-10-CM

## 2017-03-07 NOTE — Telephone Encounter (Signed)
Oral Chemotherapy Pharmacist Encounter   Attempted to reach patient for follow up on oral medication: Zytiga (abiraterone). No answer. Unable to LVM.    Darl Pikes, PharmD, BCPS Hematology/Oncology Clinical Pharmacist [facility] [place] [facility] [phone]  03/07/2017 3:41 PM

## 2017-03-09 NOTE — Telephone Encounter (Signed)
Oral Chemotherapy Pharmacist Encounter  Follow-Up Form  Called patient today to follow up regarding patient's oral chemotherapy medication: Zytiga (abiraterone)  Original Start date of oral chemotherapy: 08/2015  Pt reports 2 doses of Zytgia missed in the last month.  Missed dose(s) attributed to: having a cold Reviewed plan for missed doses.   Pt reports the following side effects: none, reported  Recent labs reviewed: PSA from 02/16/17  New medications?: none, reported  Other Issues: none, reported  Patient knows to call the office with questions or concerns. Oral Oncology Clinic will continue to follow.  Darl Pikes, PharmD, BCPS Hematology/Oncology Clinical Pharmacist [facility] [place] [facility] [phone]  03/09/2017 3:33 PM

## 2017-03-28 MED FILL — ZYTIGA 250 MG TABLET: 250 | 30 days supply | Qty: 60 | Fill #2

## 2017-03-28 MED FILL — predniSONE 5 MG TABS: 5 | 30 days supply | Qty: 60 | Fill #1

## 2017-03-28 NOTE — Telephone Encounter (Signed)
Oral Chemotherapy Pharmacist Encounter   Faxed over letter in support of keeping the patient on the branded Zytiga.  Darl Pikes, PharmD, BCPS Hematology/Oncology Clinical Pharmacist [facility] [place] [facility] [phone]

## 2017-03-30 ENCOUNTER — Inpatient Hospital Stay: Payer: 59 | Attending: Internal Medicine

## 2017-03-30 DIAGNOSIS — Z79818 Long term (current) use of other agents affecting estrogen receptors and estrogen levels: Secondary | ICD-10-CM | POA: Diagnosis not present

## 2017-03-30 DIAGNOSIS — C61 Malignant neoplasm of prostate: Secondary | ICD-10-CM | POA: Insufficient documentation

## 2017-03-30 DIAGNOSIS — C7951 Secondary malignant neoplasm of bone: Secondary | ICD-10-CM | POA: Insufficient documentation

## 2017-03-30 LAB — COMPREHENSIVE METABOLIC PANEL
ALK PHOS: 40 U/L (ref 38–126)
ALT: 11 U/L — AB (ref 17–63)
ANION GAP: 10 (ref 5–15)
AST: 18 U/L (ref 15–41)
Albumin: 4.2 g/dL (ref 3.5–5.0)
BUN: 16 mg/dL (ref 6–20)
CO2: 24 mmol/L (ref 22–32)
Calcium: 9.3 mg/dL (ref 8.9–10.3)
Chloride: 103 mmol/L (ref 101–111)
Creatinine, Ser: 0.85 mg/dL (ref 0.61–1.24)
Glucose, Bld: 135 mg/dL — ABNORMAL HIGH (ref 65–99)
Potassium: 3.2 mmol/L — ABNORMAL LOW (ref 3.5–5.1)
SODIUM: 137 mmol/L (ref 135–145)
Total Bilirubin: 0.4 mg/dL (ref 0.3–1.2)
Total Protein: 6.9 g/dL (ref 6.5–8.1)

## 2017-03-30 LAB — CBC WITH DIFFERENTIAL/PLATELET
BASOS ABS: 0 10*3/uL (ref 0–0.1)
Basophils Relative: 1 %
Eosinophils Absolute: 0.1 10*3/uL (ref 0–0.7)
Eosinophils Relative: 3 %
HEMATOCRIT: 38.9 % — AB (ref 40.0–52.0)
HEMOGLOBIN: 13.4 g/dL (ref 13.0–18.0)
Lymphocytes Relative: 35 %
Lymphs Abs: 1.7 10*3/uL (ref 1.0–3.6)
MCH: 32.8 pg (ref 26.0–34.0)
MCHC: 34.6 g/dL (ref 32.0–36.0)
MCV: 94.9 fL (ref 80.0–100.0)
Monocytes Absolute: 0.5 10*3/uL (ref 0.2–1.0)
Monocytes Relative: 11 %
NEUTROS ABS: 2.4 10*3/uL (ref 1.4–6.5)
NEUTROS PCT: 50 %
Platelets: 165 10*3/uL (ref 150–440)
RBC: 4.1 MIL/uL — AB (ref 4.40–5.90)
RDW: 13.8 % (ref 11.5–14.5)
WBC: 4.8 10*3/uL (ref 3.8–10.6)

## 2017-03-30 LAB — PSA: PROSTATIC SPECIFIC ANTIGEN: 7.47 ng/mL — AB (ref 0.00–4.00)

## 2017-04-03 ENCOUNTER — Inpatient Hospital Stay: Payer: 59

## 2017-04-03 ENCOUNTER — Inpatient Hospital Stay: Payer: 59 | Attending: Internal Medicine | Admitting: Internal Medicine

## 2017-04-03 ENCOUNTER — Other Ambulatory Visit: Payer: 59

## 2017-04-03 ENCOUNTER — Other Ambulatory Visit: Payer: Self-pay

## 2017-04-03 VITALS — BP 157/82 | HR 87 | Temp 96.2°F | Resp 20 | Wt 184.3 lb

## 2017-04-03 DIAGNOSIS — I10 Essential (primary) hypertension: Secondary | ICD-10-CM | POA: Diagnosis not present

## 2017-04-03 DIAGNOSIS — R9721 Rising PSA following treatment for malignant neoplasm of prostate: Secondary | ICD-10-CM | POA: Insufficient documentation

## 2017-04-03 DIAGNOSIS — Z7982 Long term (current) use of aspirin: Secondary | ICD-10-CM | POA: Diagnosis not present

## 2017-04-03 DIAGNOSIS — E785 Hyperlipidemia, unspecified: Secondary | ICD-10-CM | POA: Diagnosis not present

## 2017-04-03 DIAGNOSIS — Z7902 Long term (current) use of antithrombotics/antiplatelets: Secondary | ICD-10-CM | POA: Insufficient documentation

## 2017-04-03 DIAGNOSIS — G893 Neoplasm related pain (acute) (chronic): Secondary | ICD-10-CM | POA: Insufficient documentation

## 2017-04-03 DIAGNOSIS — I252 Old myocardial infarction: Secondary | ICD-10-CM | POA: Diagnosis not present

## 2017-04-03 DIAGNOSIS — Z79899 Other long term (current) drug therapy: Secondary | ICD-10-CM | POA: Diagnosis not present

## 2017-04-03 DIAGNOSIS — Z87442 Personal history of urinary calculi: Secondary | ICD-10-CM | POA: Diagnosis not present

## 2017-04-03 DIAGNOSIS — F17210 Nicotine dependence, cigarettes, uncomplicated: Secondary | ICD-10-CM | POA: Insufficient documentation

## 2017-04-03 DIAGNOSIS — C7951 Secondary malignant neoplasm of bone: Secondary | ICD-10-CM | POA: Insufficient documentation

## 2017-04-03 DIAGNOSIS — C61 Malignant neoplasm of prostate: Secondary | ICD-10-CM | POA: Insufficient documentation

## 2017-04-03 DIAGNOSIS — Z9079 Acquired absence of other genital organ(s): Secondary | ICD-10-CM | POA: Diagnosis not present

## 2017-04-03 DIAGNOSIS — I2510 Atherosclerotic heart disease of native coronary artery without angina pectoris: Secondary | ICD-10-CM | POA: Insufficient documentation

## 2017-04-03 DIAGNOSIS — Z955 Presence of coronary angioplasty implant and graft: Secondary | ICD-10-CM | POA: Diagnosis not present

## 2017-04-03 DIAGNOSIS — K5900 Constipation, unspecified: Secondary | ICD-10-CM | POA: Insufficient documentation

## 2017-04-03 DIAGNOSIS — F329 Major depressive disorder, single episode, unspecified: Secondary | ICD-10-CM | POA: Insufficient documentation

## 2017-04-03 MED ORDER — FENTANYL 100 MCG/HR TD PT72: 100.0000 ug | MEDICATED_PATCH | TRANSDERMAL | 0 refills | Status: DC

## 2017-04-03 MED ORDER — ENZALUTAMIDE 40 MG PO CAPS: 160.0000 mg | ORAL_CAPSULE | Freq: Every day | ORAL | 0 refills | Status: DC

## 2017-04-03 MED ORDER — FENTANYL 25 MCG/HR TD PT72: 25.0000 ug | MEDICATED_PATCH | TRANSDERMAL | 0 refills | Status: DC

## 2017-04-03 MED ORDER — HYDROCODONE-ACETAMINOPHEN 10-325 MG PO TABS: 1.0000 | ORAL_TABLET | Freq: Three times a day (TID) | ORAL | 0 refills | Status: DC | PRN

## 2017-04-03 NOTE — Progress Notes (Signed)
Pt here for follow up. Eyes downcast and stated in pain all over -related generalized bone pain- Asking for refills of Hydrocodone 10/325 q 6 h prn and Duragesic patch 100 mcg q 3 d. DR Yevette Edwards informed

## 2017-04-03 NOTE — Assessment & Plan Note (Addendum)
Castrate resistant prostate cancer-  Patient currently on Lupron q3M [last lupron nov 11th 2018]; on Zytiga- since Aug 2017.  Jan 2018- bone scan- no significant active disease.  January 2019 aux PET scan shows no significant active disease/which was treated bone metastases.   #Patient's PSA slightly going up  ~around 7 at this time; patient currently on Zytiga [2 pills because of intolerance].  However patient has complains of significant worsening pain in his back joints and shoulders-with concern for clinical progression.   #I discussed option of chemotherapy with the patient especially given his bulky disease; he continues to be reluctant/declines because of potential side effects.  I also discussed regarding use of Xtandi given the clinical progression.  Prescription written; potential side effects discussed/no history of seizures.  # hot flashes-from low testosterone state.  Recommend Ditropan 5 twice daily  # X-geva/ Lupron every 3 months  on ca+vit D. No AEs noted. Ca- wnl.  Reluctant with Delton See today.  # Pain related to malignancy-likely worsening/progression of disease ; recommend increasing the fentanyl patch to 125 mcg every 3 days; continue hydrocodone 10/325 every 8 hours.  Prescription sent.  # recent MI [April 2018; [place]]- s/p stenting; on Plavix; stable.  # follow up with 4 weeks/ x-geva/labs/Lupron- PSA- few days prior.

## 2017-04-03 NOTE — Progress Notes (Signed)
[place] OFFICE PROGRESS NOTE  Patient Care Team: Olin Hauser, [name], [name], [name]   SUMMARY OF ONCOLOGIC HISTORY: Oncology History   # 2011- PROSTATE CANCER [Gleason 3+4]; s/p Prostatectomy [ also involved bladder neck/ECP; Dr.Polaseck]; July 2014- Biochemical recurrence [PSA 14]- started on Zoladex [Dr.Pandit]; lost to follow up.  # JAN 2017- STAGE IV METASTATIC PROSTATE Cancer to Bone- Feb 13th, 2017-  Lupron q 74m [~end of feb]; PSA: 1021; Declined Chemo; April 2017 [xofigo x6; Dr.Crystal]; AUG 2017- Zytiga + Prednisone BID. Bone scan-Jan 2018- improved skeletal metastases.   # Mets to bone- start X-geva q 53M [May 30th]  # Smoker/ chronic pain/pain clinic      Prostate cancer metastatic to bone ([place])   12/08/2014 Initial Diagnosis    Prostate cancer metastatic to bone [facility])       INTERVAL HISTORY:   [name]  With above history of prostate cancer metastatic castrate resistant currently on Lupron + Zytiga is here for follow-up.  Patient feels poorly over the last month.  He continues to complain of worsening pain in the shoulder and back.  He states the weather changes makes his pain worse.  Complains of poor appetite.  Complains of nausea.  No vomiting.  Denies any bowel or bladder incontinence.  Positive for constipation.  He states that he has been doing his best in taking the medication/Zytiga; but he might be missing a few doses.  REVIEW OF SYSTEMS:  A complete 10 point review of system is done which is negative except mentioned above/history of present illness.   PAST MEDICAL HISTORY :  Past Medical History:  Diagnosis Date  . Back pain 10/09/2012  . Bone cancer ([place])   . CAD (coronary artery disease)    a. 08/2015 PCI: LM nl, LAD 20d, LCX nl, RCA 47m, RPAV 95  (3.0x18 Xience Alpine DES);  b. 04/2016 PCI [facility]): RPL 95 (2.75x8 Promus Premier DES).  . Cancer associated pain   . Depression   . History of echocardiogram    a. 08/2016 Echo: EF 50-55%.  Marland Kitchen History of kidney stones   . Hyperlipidemia   . Hypertension   . Joint pain   . Prostate cancer [facility])    a.  s/p prostatectomy ([facility]);  b. bone mets noted 04/2016.  . Right arm pain 01/10/2016  . Right foot pain 01/10/2016  . Right leg pain 01/10/2016  . Tobacco abuse     PAST SURGICAL HISTORY :   Past Surgical History:  Procedure Laterality Date  . CARDIAC CATHETERIZATION     [facility]  . CARDIAC CATHETERIZATION N/A 08/16/2015   Procedure: Left Heart Cath and Coronary Angiography;  Surgeon: Yolonda Kida, MD;  Location: [place] CV LAB;  Service: Cardiovascular;  Laterality: N/A;  . CARDIAC CATHETERIZATION N/A 08/16/2015   Procedure: Coronary Stent Intervention;  Surgeon: Yolonda Kida, MD;  Location: [place] CV LAB;  Service: Cardiovascular;  Laterality: N/A;  . CERVIX SURGERY    . PROSTATECTOMY    . SPINE SURGERY    . TONSILLECTOMY    . WRIST SURGERY      FAMILY HISTORY :   Family History  Problem Relation Age of Onset  . Heart attack Mother   . Hypertension Mother   . Heart attack Father     SOCIAL HISTORY:   Social History  Tobacco Use  . Smoking status: Current Every Day Smoker    Packs/day: 1.00    Years: 40.00    Pack years: 40.00    Types: Cigarettes  . Smokeless tobacco: Current User  Substance Use Topics  . Alcohol use: No    Comment: occasionally  . Drug use: No    ALLERGIES:  has No Known Allergies.  MEDICATIONS:  Current Outpatient Medications  Medication Sig Dispense Refill  . aspirin 81 MG tablet Take 81 mg by mouth daily.    Marland Kitchen atorvastatin (LIPITOR) 80 MG tablet Take 1 tablet (80 mg total) by mouth daily at 6 PM. 90 tablet 3  . clopidogrel (PLAVIX) 75 MG tablet Take 1 tablet (75 mg total) by mouth daily. 90 tablet 3  . fentaNYL  (DURAGESIC - DOSED MCG/HR) 100 MCG/HR Place 1 patch (100 mcg total) onto the skin every 3 (three) days. Along with 75mcg patch every 3 days. 10 patch 0  . HYDROcodone-acetaminophen (NORCO) 10-325 MG tablet Take 1 tablet by mouth every 8 (eight) hours as needed. 90 tablet 0  . lisinopril (PRINIVIL,ZESTRIL) 2.5 MG tablet TAKE 1 TABLET (2.5 MG TOTAL) BY MOUTH DAILY. 30 tablet 6  . metoprolol tartrate (LOPRESSOR) 25 MG tablet Take 0.5 tablets (12.5 mg total) by mouth 2 (two) times daily. 30 tablet 3  . pantoprazole (PROTONIX) 40 MG tablet Take 1 tablet (40 mg total) by mouth daily at 6 (six) AM. 30 tablet 0  . predniSONE (DELTASONE) 5 MG tablet TAKE 1 TABLET BY MOUTH 2 TIMES DAILY WITH A MEAL. TAKE DAILY WITH ZYTIGA 60 tablet 4  . promethazine (PHENERGAN) 12.5 MG tablet Take 1-2 tablets (12.5-25 mg total) by mouth every 8 (eight) hours as needed for nausea or vomiting. 60 tablet 1  . Wheat Dextrin (BENEFIBER) POWD Stir 2 tsp. TID into 4-8 oz of any non-carbonated beverage or soft food (hot or cold) 500 g PRN  . ZYTIGA 250 MG tablet TAKE 2 TABLETS (500 MG TOTAL) BY MOUTH DAILY. TAKE ON AN EMPTY STOMACH 1 HOUR BEFORE OR 2 HOURS AFTER A MEAL 60 tablet 4  . acetaminophen (TYLENOL) 500 MG tablet Take 1,000 mg by mouth every 8 (eight) hours as needed.     Marland Kitchen albuterol (PROVENTIL HFA;VENTOLIN HFA) 108 (90 Base) MCG/ACT inhaler Inhale 1-2 puffs into the lungs every 6 (six) hours as needed for wheezing or shortness of breath. (Patient not taking: Reported on 04/03/2017) 1 Inhaler 0  . enzalutamide (XTANDI) 40 MG capsule Take 4 capsules (160 mg total) by mouth daily. 120 capsule 0  . fentaNYL (DURAGESIC - DOSED MCG/HR) 25 MCG/HR patch Place 1 patch (25 mcg total) onto the skin every 3 (three) days. Use this along with fentanyl patch 100 mcg [total 125 mcg] every 3 days 10 patch 0  . nitroGLYCERIN (NITROSTAT) 0.4 MG SL tablet Place 0.4 mg under the tongue every 5 (five) minutes as needed.     Marland Kitchen oxybutynin (DITROPAN) 5  MG tablet Take 1 tablet (5 mg total) by mouth 2 (two) times daily. (Patient not taking: Reported on 04/03/2017) 60 tablet 4  . polyethylene glycol (MIRALAX / GLYCOLAX) packet Take 17 g by mouth daily as needed for moderate constipation.     No current facility-administered medications for this visit.     PHYSICAL EXAMINATION: ECOG PERFORMANCE STATUS: 1 - Symptomatic but completely ambulatory  BP (!) 157/82 (BP Location: Left Arm, Patient Position: Sitting)   Pulse 87   Temp (!) 96.2 F (35.7 C) (Tympanic)  Resp 20   Wt 184 lb 4.9 oz (83.6 kg)   BMI 26.44 kg/m   Filed Weights   04/03/17 1420  Weight: 184 lb 4.9 oz (83.6 kg)   GENERAL: Well-nourished well-developed; Alert, no distress and comfortable.Walks with a cane.He is accompanied by his wife. EYES: no pallor or icterus OROPHARYNX: no thrush or ulceration; Upper dentures. NECK: supple, no masses felt LYMPH: no palpable lymphadenopathy in the cervical, axillary or inguinal regions LUNGS: clear to auscultation and No wheeze or crackles HEART/CVS: regular rate & rhythm and no murmurs; No lower extremity edema ABDOMEN:abdomen soft, non-tender and normal bowel sounds; No hepatomegaly. Musculoskeletal:no cyanosis of digits and no clubbing; No significant tenderness noted. PSYCH: alert & oriented x 3 with fluent speech NEURO: no focal motor/sensory deficits SKIN: no rashes or significant lesions   LABORATORY DATA:  I have reviewed the data as listed    Component Value Date/Time   NA 137 03/30/2017 1419   NA 140 07/13/2013 1542   K 3.2 (L) 03/30/2017 1419   K 4.3 07/13/2013 1542   CL 103 03/30/2017 1419   CL 107 07/13/2013 1542   CO2 24 03/30/2017 1419   CO2 26 07/13/2013 1542   GLUCOSE 135 (H) 03/30/2017 1419   GLUCOSE 106 (H) 07/13/2013 1542   BUN 16 03/30/2017 1419   BUN 11 07/13/2013 1542   CREATININE 0.85 03/30/2017 1419   CREATININE 1.01 07/13/2013 1542   CALCIUM 9.3 03/30/2017 1419   CALCIUM 9.9  07/13/2013 1542   PROT 6.9 03/30/2017 1419   PROT 8.1 07/13/2013 1542   ALBUMIN 4.2 03/30/2017 1419   ALBUMIN 4.3 07/13/2013 1542   AST 18 03/30/2017 1419   AST 26 07/13/2013 1542   ALT 11 (L) 03/30/2017 1419   ALT 32 07/13/2013 1542   ALKPHOS 40 03/30/2017 1419   ALKPHOS 74 07/13/2013 1542   BILITOT 0.4 03/30/2017 1419   BILITOT 0.4 07/13/2013 1542   GFRNONAA >60 03/30/2017 1419   GFRNONAA >60 07/13/2013 1542   GFRAA >60 03/30/2017 1419   GFRAA >60 07/13/2013 1542    No results found for: SPEP, UPEP  Lab Results  Component Value Date   WBC 4.8 03/30/2017   NEUTROABS 2.4 03/30/2017   HGB 13.4 03/30/2017   HCT 38.9 (L) 03/30/2017   MCV 94.9 03/30/2017   PLT 165 03/30/2017      Chemistry      Component Value Date/Time   NA 137 03/30/2017 1419   NA 140 07/13/2013 1542   K 3.2 (L) 03/30/2017 1419   K 4.3 07/13/2013 1542   CL 103 03/30/2017 1419   CL 107 07/13/2013 1542   CO2 24 03/30/2017 1419   CO2 26 07/13/2013 1542   BUN 16 03/30/2017 1419   BUN 11 07/13/2013 1542   CREATININE 0.85 03/30/2017 1419   CREATININE 1.01 07/13/2013 1542      Component Value Date/Time   CALCIUM 9.3 03/30/2017 1419   CALCIUM 9.9 07/13/2013 1542   ALKPHOS 40 03/30/2017 1419   ALKPHOS 74 07/13/2013 1542   AST 18 03/30/2017 1419   AST 26 07/13/2013 1542   ALT 11 (L) 03/30/2017 1419   ALT 32 07/13/2013 1542   BILITOT 0.4 03/30/2017 1419   BILITOT 0.4 07/13/2013 1542     Results for Drinkard, Letrell L (MRN [id number]) as of 04/03/2017 14:26  Ref. Range 05/26/2016 13:49 07/06/2016 15:20 08/17/2016 11:03 09/28/2016 13:50 11/09/2016 13:28 01/08/2017 15:16 02/16/2017 14:03 03/30/2017 14:19  PSA Latest Ref Range: 0.00 - 4.00 ng/mL  3.38         Prostatic Specific Antigen Latest Ref Range: 0.00 - 4.00 ng/mL  3.33 3.15 2.99 2.56 5.87 (H) 5.35 (H) 7.47 (H)    IMPRESSION: 1. No evidence of local prostate cancer recurrence within the prostate bed. 2. No evidence of metastatic nodal disease in the pelvis or  abdomen. 3. No evidence distant disease. 4. No evidence of active skeletal metastasis. Widespread metastatic sclerotic skeletal lesions without associated radiotracer activity are favored treated metastasis.   Electronically Signed   By: Suzy Bouchard M.D.   On: 01/23/2017 15:03   ASSESSMENT & PLAN:    Prostate cancer metastatic to bone ([place]) Castrate resistant prostate cancer-  Patient currently on Lupron q3M [last lupron nov 11th 2018]; on Zytiga- since Aug 2017.  Jan 2018- bone scan- no significant active disease.  January 2019 aux PET scan shows no significant active disease/which was treated bone metastases.   #Patient's PSA slightly going up  ~around 7 at this time; patient currently on Zytiga [2 pills because of intolerance].  However patient has complains of significant worsening pain in his back joints and shoulders-with concern for clinical progression.   #I discussed option of chemotherapy with the patient especially given his bulky disease; he continues to be reluctant/declines because of potential side effects.  I also discussed regarding use of Xtandi given the clinical progression.  Prescription written; potential side effects discussed/no history of seizures.  # hot flashes-from low testosterone state.  Recommend Ditropan 5 twice daily  # X-geva/ Lupron every 3 months  on ca+vit D. No AEs noted. Ca- wnl.  Reluctant with Delton See today.  # Pain related to malignancy-likely worsening/progression of disease ; recommend increasing the fentanyl patch to 125 mcg every 3 days; continue hydrocodone 10/325 every 8 hours.  Prescription sent.  # recent MI [April 2018; [place]]- s/p stenting; on Plavix; stable.  # follow up with 4 weeks/ x-geva/labs/Lupron- PSA- few days prior.     [name], MD 04/03/2017 4:25 PM

## 2017-04-04 ENCOUNTER — Telehealth: Payer: Self-pay | Admitting: Internal Medicine

## 2017-04-04 NOTE — Telephone Encounter (Signed)
Oral Oncology Patient Advocate Encounter  Received notification from [facility] that prior authorization for Gillermina Phy is required.  PA submitted on CoverMyMeds Key [id number] Status is pending  Oral Oncology Clinic will continue to follow.    [place] Patient Advocate [phone] 04/04/2017 11:37 AM

## 2017-04-05 ENCOUNTER — Telehealth: Payer: Self-pay | Admitting: *Deleted

## 2017-04-05 NOTE — Telephone Encounter (Signed)
Oral Oncology Patient Advocate Encounter  Prior Authorization for Gillermina Phy has been approved.    PA# [id number] Effective dates: 04/04/2017 through 04/04/2018  Oral Oncology Clinic will continue to follow.   Fabio Asa. Melynda [place], [place] Patient [place] [phone] 04/05/2017 10:47 AM

## 2017-04-05 NOTE — Telephone Encounter (Signed)
PA submitted for patient: Robert Short  fentaNYL 25MCG patch. Clinical key: [id number] - pending insurance approval

## 2017-04-06 ENCOUNTER — Telehealth: Payer: Self-pay | Admitting: *Deleted

## 2017-04-06 NOTE — Telephone Encounter (Signed)
Patient's wife made aware that insurance approved fentanyl

## 2017-04-06 NOTE — Telephone Encounter (Signed)
Fentanyl has been approved

## 2017-04-06 NOTE — Telephone Encounter (Signed)
-----   Message from Secundino Ginger sent at 04/06/2017 11:24 AM EDT ----- Regarding: pre auth Contact: [phone] 25 mg pain patch needs pre auth. [facility]) Check and see if [facility] phar sent it to you.

## 2017-04-06 NOTE — Telephone Encounter (Signed)
reviewed chart/cover my meds-  Insurance has not determined approval at this time. May take 24- 48 hrs for approval process to be complete.  Wife made aware of update. I reassured patient's wife that I will continue to update her on the  She also inquired about the xstandi prescription. I explained that pharmacy documented that his insurance has approved the xstandi. I will have Allyson follow-up with the patient on Monday, when she returns for the prescription status.

## 2017-04-11 ENCOUNTER — Telehealth: Payer: Self-pay | Admitting: Pharmacist

## 2017-04-11 DIAGNOSIS — C7951 Secondary malignant neoplasm of bone: Principal | ICD-10-CM

## 2017-04-11 DIAGNOSIS — C61 Malignant neoplasm of prostate: Secondary | ICD-10-CM

## 2017-04-11 MED ORDER — ENZALUTAMIDE 40 MG PO CAPS: 160.0000 mg | ORAL_CAPSULE | Freq: Every day | ORAL | 0 refills | Status: DC

## 2017-04-11 MED FILL — XTANDI 40 MG CAPSULE: 40 | 30 days supply | Qty: 120 | Fill #0

## 2017-04-11 NOTE — Telephone Encounter (Signed)
Oral Oncology Pharmacist Encounter  Received new prescription for Xtandi (enzalutamide) for the treatment of metastatic breast cancer, planned duration until disease progression or unacceptable drug toxicity.  BP from 04/03/17 assessed, slightly elevated will continue to monitor. Prescription dose and frequency assessed.   Current medication list in Epic reviewed, DDIs with enzalutamide identified: - Enzalutamide may decrease the concentration of atorvastatin, hydrocodone-acetaminophen, pantoprazole, fentanyl -Clopidogrel may decrease the concentration of enzalutamide -Patient should be monitored for decreased effectiveness of atorvastatin, hydrocodone-acetaminophen, pantoprazole, fentanyl, and enzalutamide  Prescription has been e-scribed to the [facility] for benefits analysis and approval.  Oral Oncology Clinic will continue to follow for insurance authorization, copayment issues, initial counseling and start date.  Darl Pikes, PharmD, BCPS Hematology/Oncology Clinical Pharmacist [facility] [place] [facility] [phone]  04/11/2017 8:31 AM

## 2017-04-11 NOTE — Telephone Encounter (Signed)
Oral Chemotherapy Pharmacist Encounter   Enrolled patient in copay card for [facility]: [place] RxGRP: [id number] [place]: [phone]) ID: [id number]  Copy of the copay card will be scanned into the medical record.  Darl Pikes, PharmD, BCPS Hematology/Oncology Clinical Pharmacist [facility] [place] [facility] [phone]  04/11/2017 9:30 AM

## 2017-04-11 NOTE — Telephone Encounter (Signed)
Oral Chemotherapy Pharmacist Encounter  Patient Education I spoke with patient for overview of new oral chemotherapy medication: Xtandi (enzalutamide) for the treatment of metastatic prostate cancer, planned duration until disease progression or unacceptable drug toxicity.   Counseled patient on administration, dosing, side effects, monitoring, drug-food interactions, safe handling, storage, and disposal. Patient will take 4 capsules (160 mg total) by mouth daily.  Side effects include but not limited to: fatigue, headache, HTN, decreased appetite.    Reviewed with patient importance of keeping a medication schedule and plan for any missed doses.  Mr. Robert Short voiced understanding and appreciation. All questions answered. Medication handout mailed to patient.   Provided patient with [facility] phone number. Patient knows to call the office with questions or concerns. Oral Chemotherapy Navigation Clinic will continue to follow.  Darl Pikes, PharmD, BCPS Hematology/Oncology Clinical Pharmacist [facility] [place] [facility] [phone]  04/11/2017 1:39 PM

## 2017-04-18 ENCOUNTER — Other Ambulatory Visit: Payer: Self-pay | Admitting: Family Medicine

## 2017-04-18 ENCOUNTER — Ambulatory Visit (INDEPENDENT_AMBULATORY_CARE_PROVIDER_SITE_OTHER): Payer: 59 | Admitting: Family Medicine

## 2017-04-18 ENCOUNTER — Encounter: Payer: Self-pay | Admitting: Family Medicine

## 2017-04-18 VITALS — BP 126/83 | HR 73 | Temp 98.0°F | Resp 16 | Ht 71.0 in | Wt 183.0 lb

## 2017-04-18 DIAGNOSIS — C61 Malignant neoplasm of prostate: Secondary | ICD-10-CM

## 2017-04-18 DIAGNOSIS — I10 Essential (primary) hypertension: Secondary | ICD-10-CM

## 2017-04-18 DIAGNOSIS — R7303 Prediabetes: Secondary | ICD-10-CM

## 2017-04-18 DIAGNOSIS — C7951 Secondary malignant neoplasm of bone: Secondary | ICD-10-CM

## 2017-04-18 DIAGNOSIS — E782 Mixed hyperlipidemia: Secondary | ICD-10-CM

## 2017-04-18 DIAGNOSIS — I208 Other forms of angina pectoris: Secondary | ICD-10-CM

## 2017-04-18 DIAGNOSIS — I25118 Atherosclerotic heart disease of native coronary artery with other forms of angina pectoris: Secondary | ICD-10-CM

## 2017-04-18 LAB — POCT GLYCOSYLATED HEMOGLOBIN (HGB A1C): HEMOGLOBIN A1C: 6.4 — AB (ref ?–5.7)

## 2017-04-18 NOTE — Assessment & Plan Note (Signed)
Controlled HTN - Home BP readings improved Complication with CAD, prostate CA    Plan:  1. Continue low dose BP medications since resumed by cardiology since previous visit - Lisinopril 2.5mg  daily and Metoprolol 12.5mg  BID (half tab 25) 2. Encourage improved lifestyle - low sodium diet, regular exercise as tolerated 3. Continue monitor BP outside office, bring readings to next visit, if persistently >140/90 or new symptoms notify office sooner 4. Follow-up 6 months annual and labs

## 2017-04-18 NOTE — Assessment & Plan Note (Signed)
Followed by Dr Rogue Bussing [facility] Considering new chemotherapy Xtandi Recommend that patient proceed with Oncology plans if interested in further aggressive chemo

## 2017-04-18 NOTE — Assessment & Plan Note (Signed)
Worsening control Pre-DM with A1c 6.4 from prior 6.0 to 6.2 Attributed to diet, inc soda and lifestyle   Plan:  1. Not on any therapy currently  2. Encourage improved lifestyle - low carb, low sugar diet, reduce portion size, continue improving regular exercise 3. Follow-up 6 months annual and labs repeat A1c

## 2017-04-18 NOTE — Patient Instructions (Addendum)
Thank you for coming to the office today.  A1c 6.4, slightly higher than last time at 6.0, you have been around 6.2 in past as well.  Try to reduce sodas and sugars in diet, inc water and may try diet or artificial drinks as well if you prefer  No change to medicines at this time.  BP is well controlled  I would support decision of Dr Rogue Bussing with regards the chemotherapy. Follow up with them to determine if it is effective for you or if side effects, there are always options.  DUE for FASTING BLOOD WORK (no food or drink after midnight before the lab appointment, only water or coffee without cream/sugar on the morning of)  SCHEDULE "Lab Only" visit in the morning at the clinic for lab draw in 6 MONTHS   - Make sure Lab Only appointment is at about 1 week before your next appointment, so that results will be available  For Lab Results, once available within 2-3 days of blood draw, you can can log in to MyChart online to view your results and a brief explanation. Also, we can discuss results at next follow-up visit.   Please schedule a Follow-up Appointment to: Return in about 6 months (around 10/18/2017) for Annual Physical.  If you have any other questions or concerns, please feel free to call the office or send a message through [place]. You may also schedule an earlier appointment if necessary.  Additionally, you may be receiving a survey about your experience at our office within a few days to 1 week by e-mail or mail. We value your feedback.  Robert Putnam, DO [place]

## 2017-04-18 NOTE — Assessment & Plan Note (Addendum)
Asymptomatic Followed by Cardiology No further changes or concerns Remains on med management

## 2017-04-18 NOTE — Progress Notes (Signed)
Subjective:    Patient ID: Robert Short, male    DOB: [birth year], 67 y.o.   MRN: [id number]  Robert Short is a 67 y.o. male presenting on 04/18/2017 for Hypertension   HPI   Specialists: Oncology - Dr Charlaine Dalton [facility]) Cardiology - Dr Ida Rogue [facility])  Pre-Diabetes: Previous A1c trend elevated low 6s. Now A1c up to 6.4, attributed to lifestyle diet CBGs: None Meds: None - continues on chronic prednisone 5mg  daily Currently on ACEi Lifestyle: - Diet (admits still drinking a lot of soda, not liking taste of water) - Exercise (limited) Denies hypoglycemia, polyuria, visual changes, numbness or tingling.  CHRONIC HTN Reports no new concerns. BP at home remain controlled. Current Meds - Lisinopril 2.5mg  daily, Metoprolol 12.5mg  BID (half of 25mg  tab)  Reports good compliance, took meds today. Tolerating well, w/o complaints. Denies CP, dyspnea, HA, edema, dizziness / lightheadedness  Prostate Cancer, metastatic to bone Followed by Dr Rogue Bussing [facility], has recently 04/2017 been recommended to try new chemotherapy agent Xtandi, he is hesitant at the moment still considering, he wishes to get dental work done first.  [place] - CAD, stable angina, s/p recent stent in 04/2016. History of STEMI 04/2015, continues on Plavix, ASA, Statin lipitor   Depression screen [facility] 2/9 04/18/2017 01/30/2017 10/18/2016  Decreased Interest 0 1 0  Down, Depressed, Hopeless 0 1 0  PHQ - 2 Score 0 2 0  Altered sleeping - 1 -  Tired, decreased energy - 1 -  Change in appetite - 2 -  Feeling bad or failure about yourself  - 1 -  Trouble concentrating - 1 -  Moving slowly or fidgety/restless - 0 -  Suicidal thoughts - 1 -  PHQ-9 Score - 9 -  Difficult doing work/chores - Somewhat difficult -    Social History   Tobacco Use  . Smoking status: Current Every Day Smoker    Packs/day: 1.00    Years: 40.00    Pack years: 40.00    Types: Cigarettes  . Smokeless  tobacco: Current User  Substance Use Topics  . Alcohol use: No    Comment: occasionally  . Drug use: No    Review of Systems Per HPI unless specifically indicated above     Objective:    BP 126/83   Pulse 73   Temp 98 F (36.7 C) (Oral)   Resp 16   Ht 5\' 11"  (1.803 m)   Wt 183 lb (83 kg)   BMI 25.52 kg/m   Wt Readings from Last 3 Encounters:  04/18/17 183 lb (83 kg)  04/03/17 184 lb 4.9 oz (83.6 kg)  02/20/17 184 lb (83.5 kg)    Physical Exam  Constitutional: He is oriented to person, place, and time. He appears well-developed and well-nourished. No distress.  Well-appearing, comfortable, cooperative  HENT:  Head: Normocephalic and atraumatic.  Mouth/Throat: Oropharynx is clear and moist.  Eyes: Conjunctivae are normal. Right eye exhibits no discharge. Left eye exhibits no discharge.  Neck: Normal range of motion. Neck supple. No thyromegaly present.  Cardiovascular: Normal rate, regular rhythm, normal heart sounds and intact distal pulses.  No murmur heard. Pulmonary/Chest: Effort normal and breath sounds normal. No respiratory distress. He has no wheezes. He has no rales.  Musculoskeletal: Normal range of motion. He exhibits no edema.  Lymphadenopathy:    He has no cervical adenopathy.  Neurological: He is alert and oriented to person, place, and time.  Skin: Skin is warm  and dry. No rash noted. He is not diaphoretic. No erythema.  Psychiatric: He has a normal mood and affect. His behavior is normal.  Well groomed, good eye contact, normal speech and thoughts  Nursing note and vitals reviewed.    Recent Labs    10/18/16 1645 04/18/17 2324  HGBA1C 6.0* 6.4*    Results for orders placed or performed in visit on 04/18/17  POCT HgB A1C  Result Value Ref Range   Hemoglobin A1C 6.4 (A) 5.7      Assessment & Plan:   Problem List Items Addressed This Visit    Essential (primary) hypertension    Controlled HTN - Home BP readings improved Complication with  CAD, prostate CA    Plan:  1. Continue low dose BP medications since resumed by cardiology since previous visit - Lisinopril 2.5mg  daily and Metoprolol 12.5mg  BID (half tab 25) 2. Encourage improved lifestyle - low sodium diet, regular exercise as tolerated 3. Continue monitor BP outside office, bring readings to next visit, if persistently >140/90 or new symptoms notify office sooner 4. Follow-up 6 months annual and labs      Pre-diabetes - Primary    Worsening control Pre-DM with A1c 6.4 from prior 6.0 to 6.2 Attributed to diet, inc soda and lifestyle   Plan:  1. Not on any therapy currently  2. Encourage improved lifestyle - low carb, low sugar diet, reduce portion size, continue improving regular exercise 3. Follow-up 6 months annual and labs repeat A1c      Relevant Orders   POCT HgB A1C (Completed)   Prostate cancer metastatic to bone (HCC) (Chronic)    Followed by Dr Rogue Bussing [facility] Considering new chemotherapy Xtandi Recommend that patient proceed with Oncology plans if interested in further aggressive chemo      Stable angina ([place])    Asymptomatic Followed by Cardiology No further changes or concerns Remains on med management         No orders of the defined types were placed in this encounter.   Follow up plan: Return in about 6 months (around 10/18/2017) for Annual Physical.  Future labs ordered for 10/2017  Nobie Putnam, [facility] 04/18/2017, 11:29 PM

## 2017-04-30 ENCOUNTER — Other Ambulatory Visit: Payer: Self-pay | Admitting: Internal Medicine

## 2017-04-30 ENCOUNTER — Inpatient Hospital Stay: Payer: 59 | Attending: Internal Medicine

## 2017-04-30 DIAGNOSIS — R9721 Rising PSA following treatment for malignant neoplasm of prostate: Secondary | ICD-10-CM | POA: Diagnosis not present

## 2017-04-30 DIAGNOSIS — F17210 Nicotine dependence, cigarettes, uncomplicated: Secondary | ICD-10-CM | POA: Insufficient documentation

## 2017-04-30 DIAGNOSIS — Z79899 Other long term (current) drug therapy: Secondary | ICD-10-CM | POA: Diagnosis not present

## 2017-04-30 DIAGNOSIS — G893 Neoplasm related pain (acute) (chronic): Secondary | ICD-10-CM | POA: Insufficient documentation

## 2017-04-30 DIAGNOSIS — C61 Malignant neoplasm of prostate: Secondary | ICD-10-CM | POA: Diagnosis not present

## 2017-04-30 DIAGNOSIS — I252 Old myocardial infarction: Secondary | ICD-10-CM | POA: Diagnosis not present

## 2017-04-30 DIAGNOSIS — C7951 Secondary malignant neoplasm of bone: Principal | ICD-10-CM

## 2017-04-30 DIAGNOSIS — Z7902 Long term (current) use of antithrombotics/antiplatelets: Secondary | ICD-10-CM | POA: Insufficient documentation

## 2017-04-30 DIAGNOSIS — Z955 Presence of coronary angioplasty implant and graft: Secondary | ICD-10-CM | POA: Diagnosis not present

## 2017-04-30 LAB — CBC WITH DIFFERENTIAL/PLATELET
BASOS ABS: 0 10*3/uL (ref 0–0.1)
Basophils Relative: 1 %
EOS PCT: 3 %
Eosinophils Absolute: 0.1 10*3/uL (ref 0–0.7)
HCT: 38.6 % — ABNORMAL LOW (ref 40.0–52.0)
Hemoglobin: 13.1 g/dL (ref 13.0–18.0)
LYMPHS ABS: 1.2 10*3/uL (ref 1.0–3.6)
LYMPHS PCT: 22 %
MCH: 32.5 pg (ref 26.0–34.0)
MCHC: 33.9 g/dL (ref 32.0–36.0)
MCV: 95.8 fL (ref 80.0–100.0)
MONO ABS: 0.5 10*3/uL (ref 0.2–1.0)
MONOS PCT: 9 %
Neutro Abs: 3.6 10*3/uL (ref 1.4–6.5)
Neutrophils Relative %: 67 %
PLATELETS: 164 10*3/uL (ref 150–440)
RBC: 4.03 MIL/uL — ABNORMAL LOW (ref 4.40–5.90)
RDW: 13.8 % (ref 11.5–14.5)
WBC: 5.5 10*3/uL (ref 3.8–10.6)

## 2017-04-30 LAB — COMPREHENSIVE METABOLIC PANEL
ALBUMIN: 4.2 g/dL (ref 3.5–5.0)
ALT: 21 U/L (ref 17–63)
AST: 27 U/L (ref 15–41)
Alkaline Phosphatase: 32 U/L — ABNORMAL LOW (ref 38–126)
Anion gap: 7 (ref 5–15)
BUN: 12 mg/dL (ref 6–20)
CHLORIDE: 105 mmol/L (ref 101–111)
CO2: 25 mmol/L (ref 22–32)
Calcium: 9 mg/dL (ref 8.9–10.3)
Creatinine, Ser: 0.79 mg/dL (ref 0.61–1.24)
GFR calc Af Amer: >60 mL/min (ref >60)
GFR calc non Af Amer: >60 mL/min (ref >60)
GLUCOSE: 162 mg/dL — AB (ref 65–99)
POTASSIUM: 3.8 mmol/L (ref 3.5–5.1)
Sodium: 137 mmol/L (ref 135–145)
Total Bilirubin: 0.3 mg/dL (ref 0.3–1.2)
Total Protein: 6.5 g/dL (ref 6.5–8.1)

## 2017-04-30 LAB — PSA: PROSTATIC SPECIFIC ANTIGEN: 8.5 ng/mL — AB (ref 0.00–4.00)

## 2017-04-30 MED ORDER — FENTANYL 100 MCG/HR TD PT72: 100.0000 ug | MEDICATED_PATCH | TRANSDERMAL | 0 refills | Status: DC

## 2017-04-30 MED ORDER — FENTANYL 25 MCG/HR TD PT72: 25.0000 ug | MEDICATED_PATCH | TRANSDERMAL | 0 refills | Status: DC

## 2017-04-30 MED ORDER — HYDROCODONE-ACETAMINOPHEN 10-325 MG PO TABS: 1.0000 | ORAL_TABLET | Freq: Three times a day (TID) | ORAL | 0 refills | Status: DC | PRN

## 2017-05-01 ENCOUNTER — Inpatient Hospital Stay: Payer: 59

## 2017-05-01 ENCOUNTER — Inpatient Hospital Stay (HOSPITAL_BASED_OUTPATIENT_CLINIC_OR_DEPARTMENT_OTHER): Payer: 59 | Admitting: Internal Medicine

## 2017-05-01 ENCOUNTER — Encounter: Payer: Self-pay | Admitting: Internal Medicine

## 2017-05-01 VITALS — BP 146/94 | HR 81 | Temp 96.7°F | Resp 16 | Wt 184.3 lb

## 2017-05-01 DIAGNOSIS — Z79899 Other long term (current) drug therapy: Secondary | ICD-10-CM | POA: Diagnosis not present

## 2017-05-01 DIAGNOSIS — F17210 Nicotine dependence, cigarettes, uncomplicated: Secondary | ICD-10-CM

## 2017-05-01 DIAGNOSIS — C61 Malignant neoplasm of prostate: Secondary | ICD-10-CM

## 2017-05-01 DIAGNOSIS — Z7902 Long term (current) use of antithrombotics/antiplatelets: Secondary | ICD-10-CM

## 2017-05-01 DIAGNOSIS — G893 Neoplasm related pain (acute) (chronic): Secondary | ICD-10-CM

## 2017-05-01 DIAGNOSIS — C7951 Secondary malignant neoplasm of bone: Principal | ICD-10-CM

## 2017-05-01 DIAGNOSIS — R9721 Rising PSA following treatment for malignant neoplasm of prostate: Secondary | ICD-10-CM | POA: Diagnosis not present

## 2017-05-01 DIAGNOSIS — Z955 Presence of coronary angioplasty implant and graft: Secondary | ICD-10-CM

## 2017-05-01 DIAGNOSIS — I252 Old myocardial infarction: Secondary | ICD-10-CM

## 2017-05-01 MED ORDER — DENOSUMAB 120 MG/1.7ML ~~LOC~~ SOLN
120.0000 mg | Freq: Once | SUBCUTANEOUS | Status: AC
  Administered 2017-05-01: 120 mg via SUBCUTANEOUS

## 2017-05-01 MED ORDER — LEUPROLIDE ACETATE (3 MONTH) 22.5 MG IM KIT
22.5000 mg | PACK | Freq: Once | INTRAMUSCULAR | Status: AC
  Administered 2017-05-01: 22.5 mg via INTRAMUSCULAR

## 2017-05-01 NOTE — Assessment & Plan Note (Addendum)
Castrate resistant prostate cancer-  Patient currently on Lupron q3M ; on Zytiga- since Aug 2017.  Jan 2018- bone scan- no significant active disease.  January 2019 aux PET scan shows no significant active disease/which was treated bone metastases.   #Given the progression of disease on Zytiga I would recommend starting Xtandi.  Patient has declined chemotherapy so far along.  Again reviewed the potential side effects of Xtandi including but not limited to seizures/fatigue etc.  #Bone metastases -X-geva  on ca+vit D. No AEs noted. Ca- wnl; last X-geva 2/19; okay to have dental extraction.  # hot flashes-from low testosterone state intolerance to Ditropan.  # Dental work needed- awaiting extraction- amoxicillin   # Pain related to malignancy-Currently stable continue fentanyl patch to 125 mcg every 3 days; continue hydrocodone 10/325 every 8 hours.   # famiily history- Breast cancer [mom-sister x4- breast cancer]; ? Ovarian cancer [dad's sister]; 3 brothers- no knowledge/  Omniseq ordered today [[facility] 2014]  # follow up with 4 weeks/ X-tandi; bmp/PSA; few days prior. Lupron today.

## 2017-05-01 NOTE — Progress Notes (Signed)
[facility] OFFICE PROGRESS NOTE  Patient Care Team: Olin Hauser, [name], [name], [name]   SUMMARY OF ONCOLOGIC HISTORY: Oncology History   # 2011- PROSTATE CANCER [Gleason 3+4]; s/p Prostatectomy [ also involved bladder neck/ECP; Dr.Polaseck]; July 2014- Biochemical recurrence [PSA 14]- started on Zoladex [Dr.Pandit]; lost to follow up.  # JAN 2017- STAGE IV METASTATIC PROSTATE Cancer to Bone- Feb 13th, 2017-  Lupron q 48m [~end of feb]; PSA: 1021; Declined Chemo; April 2017 [xofigo x6; Dr.Crystal]; AUG 2017- Zytiga + Prednisone BID. Bone scan-Jan 2018- improved skeletal metastases.   # MAY 1st 2019- START X-tandi [stopped zytiga/PSA- 8.8/rising]  # Mets to bone- start X-geva q 52M [May 30th]  # Smoker/ chronic pain/pain clinic   #[April 2018; [place]]- s/p stenting; on Plavix;      Prostate cancer metastatic to bone ([place])   12/08/2014 Initial Diagnosis    Prostate cancer metastatic to bone [facility])       INTERVAL HISTORY:   [name]  With above history of prostate cancer metastatic castrate resistant currently on Lupron + Zytiga is here for follow-up.  Recommend Xtandi at last visit because of clinical progression.  Patient did not start Xtandi because of upcoming dental extraction.  Patient approximate 2 weeks ago was diagnosed with dental abscess/status post antibiotics.  Patient felt poorly on antibiotics with nausea; poor appetite; fatigue.  Currently improved.  He is awaiting dental extractions.    Since going up on pain medication since last visit he feels slightly better in terms of the pain.  Today for the first time he mentions that he might be open to chemotherapy in future.   REVIEW OF SYSTEMS:  A complete 10 point review of system is done which is negative  except mentioned above/history of present illness.   PAST MEDICAL HISTORY :  Past Medical History:  Diagnosis Date  . Back pain 10/09/2012  . Bone cancer ([place])   . CAD (coronary artery disease)    a. 08/2015 PCI: LM nl, LAD 20d, LCX nl, RCA 27m, RPAV 95 (3.0x18 Xience Alpine DES);  b. 04/2016 PCI [facility]): RPL 95 (2.75x8 Promus Premier DES).  . Cancer associated pain   . Depression   . History of echocardiogram    a. 08/2016 Echo: EF 50-55%.  Marland Kitchen History of kidney stones   . Hyperlipidemia   . Hypertension   . Joint pain   . Prostate cancer [facility])    a.  s/p prostatectomy ([facility]);  b. bone mets noted 04/2016.  . Right arm pain 01/10/2016  . Right foot pain 01/10/2016  . Right leg pain 01/10/2016  . Tobacco abuse     PAST SURGICAL HISTORY :   Past Surgical History:  Procedure Laterality Date  . CARDIAC CATHETERIZATION     [facility]  . CARDIAC CATHETERIZATION N/A 08/16/2015   Procedure: Left Heart Cath and Coronary Angiography;  Surgeon: Yolonda Kida, MD;  Location: [place] CV [facility];  Service: Cardiovascular;  Laterality: N/A;  . CARDIAC CATHETERIZATION N/A 08/16/2015   Procedure: Coronary Stent Intervention;  Surgeon: Yolonda Kida, MD;  Location: [place] CV [facility];  Service: Cardiovascular;  Laterality: N/A;  . CERVIX SURGERY    . PROSTATECTOMY    . SPINE SURGERY    . TONSILLECTOMY    . WRIST SURGERY      FAMILY HISTORY :  Family History  Problem Relation Age of Onset  . Heart attack Mother   . Hypertension Mother   . Heart attack Father     SOCIAL HISTORY:   Social History   Tobacco Use  . Smoking status: Current Every Day Smoker    Packs/day: 1.00    Years: 40.00    Pack years: 40.00    Types: Cigarettes  . Smokeless tobacco: Current User  Substance Use Topics  . Alcohol use: No    Comment: occasionally  . Drug use: No    ALLERGIES:  has No Known Allergies.  MEDICATIONS:  Current Outpatient Medications  Medication Sig Dispense Refill  .  acetaminophen (TYLENOL) 500 MG tablet Take 1,000 mg by mouth every 8 (eight) hours as needed.     Marland Kitchen albuterol (PROVENTIL HFA;VENTOLIN HFA) 108 (90 Base) MCG/ACT inhaler Inhale 1-2 puffs into the lungs every 6 (six) hours as needed for wheezing or shortness of breath. 1 Inhaler 0  . aspirin 81 MG tablet Take 81 mg by mouth daily.    Marland Kitchen atorvastatin (LIPITOR) 80 MG tablet Take 1 tablet (80 mg total) by mouth daily at 6 PM. 90 tablet 3  . enzalutamide (XTANDI) 40 MG capsule Take 4 capsules (160 mg total) by mouth daily. 120 capsule 0  . fentaNYL (DURAGESIC - DOSED MCG/HR) 100 MCG/HR Place 1 patch (100 mcg total) onto the skin every 3 (three) days. Along with 78mcg patch every 3 days. 10 patch 0  . fentaNYL (DURAGESIC - DOSED MCG/HR) 25 MCG/HR patch Place 1 patch (25 mcg total) onto the skin every 3 (three) days. Use this along with fentanyl patch 100 mcg [total 125 mcg] every 3 days 10 patch 0  . HYDROcodone-acetaminophen (NORCO) 10-325 MG tablet Take 1 tablet by mouth every 8 (eight) hours as needed. 90 tablet 0  . lisinopril (PRINIVIL,ZESTRIL) 2.5 MG tablet TAKE 1 TABLET (2.5 MG TOTAL) BY MOUTH DAILY. 30 tablet 6  . metoprolol tartrate (LOPRESSOR) 25 MG tablet Take 0.5 tablets (12.5 mg total) by mouth 2 (two) times daily. 30 tablet 3  . nitroGLYCERIN (NITROSTAT) 0.4 MG SL tablet Place 0.4 mg under the tongue every 5 (five) minutes as needed.     Marland Kitchen oxybutynin (DITROPAN) 5 MG tablet Take 1 tablet (5 mg total) by mouth 2 (two) times daily. 60 tablet 4  . pantoprazole (PROTONIX) 40 MG tablet Take 1 tablet (40 mg total) by mouth daily at 6 (six) AM. 30 tablet 0  . polyethylene glycol (MIRALAX / GLYCOLAX) packet Take 17 g by mouth daily as needed for moderate constipation.    . promethazine (PHENERGAN) 12.5 MG tablet Take 1-2 tablets (12.5-25 mg total) by mouth every 8 (eight) hours as needed for nausea or vomiting. 60 tablet 1  . Wheat Dextrin (BENEFIBER) POWD Stir 2 tsp. TID into 4-8 oz of any  non-carbonated beverage or soft food (hot or cold) 500 g PRN  . clopidogrel (PLAVIX) 75 MG tablet TAKE 1 TABLET (75 MG TOTAL) BY MOUTH DAILY. 90 tablet 2   No current facility-administered medications for this visit.     PHYSICAL EXAMINATION: ECOG PERFORMANCE STATUS: 1 - Symptomatic but completely ambulatory  BP (!) 146/94 (BP Location: Left Arm, Patient Position: Sitting)   Pulse 81   Temp (!) 96.7 F (35.9 C)   Resp 16   Wt 184 lb 4.9 oz (83.6 kg)   BMI 25.71 kg/m   Filed Weights   05/01/17 1340  Weight: 184 lb 4.9 oz (83.6 kg)  GENERAL: Well-nourished well-developed; Alert, no distress and comfortable.Walks with a cane.He is accompanied by his wife. EYES: no pallor or icterus OROPHARYNX: no thrush or ulceration; Upper dentures. NECK: supple, no masses felt LYMPH: no palpable lymphadenopathy in the cervical, axillary or inguinal regions LUNGS: clear to auscultation and No wheeze or crackles HEART/CVS: regular rate & rhythm and no murmurs; No lower extremity edema ABDOMEN:abdomen soft, non-tender and normal bowel sounds; No hepatomegaly. Musculoskeletal:no cyanosis of digits and no clubbing; No significant tenderness noted. PSYCH: alert & oriented x 3 with fluent speech NEURO: no focal motor/sensory deficits SKIN: no rashes or significant lesions   LABORATORY DATA:  I have reviewed the data as listed    Component Value Date/Time   NA 137 04/30/2017 1341   NA 140 07/13/2013 1542   K 3.8 04/30/2017 1341   K 4.3 07/13/2013 1542   CL 105 04/30/2017 1341   CL 107 07/13/2013 1542   CO2 25 04/30/2017 1341   CO2 26 07/13/2013 1542   GLUCOSE 162 (H) 04/30/2017 1341   GLUCOSE 106 (H) 07/13/2013 1542   BUN 12 04/30/2017 1341   BUN 11 07/13/2013 1542   CREATININE 0.79 04/30/2017 1341   CREATININE 1.01 07/13/2013 1542   CALCIUM 9.0 04/30/2017 1341   CALCIUM 9.9 07/13/2013 1542   PROT 6.5 04/30/2017 1341   PROT 8.1 07/13/2013 1542   ALBUMIN 4.2 04/30/2017 1341    ALBUMIN 4.3 07/13/2013 1542   AST 27 04/30/2017 1341   AST 26 07/13/2013 1542   ALT 21 04/30/2017 1341   ALT 32 07/13/2013 1542   ALKPHOS 32 (L) 04/30/2017 1341   ALKPHOS 74 07/13/2013 1542   BILITOT 0.3 04/30/2017 1341   BILITOT 0.4 07/13/2013 1542   GFRNONAA >60 04/30/2017 1341   GFRNONAA >60 07/13/2013 1542   GFRAA >60 04/30/2017 1341   GFRAA >60 07/13/2013 1542    No results found for: SPEP, UPEP  Lab Results  Component Value Date   WBC 5.5 04/30/2017   NEUTROABS 3.6 04/30/2017   HGB 13.1 04/30/2017   HCT 38.6 (L) 04/30/2017   MCV 95.8 04/30/2017   PLT 164 04/30/2017      Chemistry      Component Value Date/Time   NA 137 04/30/2017 1341   NA 140 07/13/2013 1542   K 3.8 04/30/2017 1341   K 4.3 07/13/2013 1542   CL 105 04/30/2017 1341   CL 107 07/13/2013 1542   CO2 25 04/30/2017 1341   CO2 26 07/13/2013 1542   BUN 12 04/30/2017 1341   BUN 11 07/13/2013 1542   CREATININE 0.79 04/30/2017 1341   CREATININE 1.01 07/13/2013 1542      Component Value Date/Time   CALCIUM 9.0 04/30/2017 1341   CALCIUM 9.9 07/13/2013 1542   ALKPHOS 32 (L) 04/30/2017 1341   ALKPHOS 74 07/13/2013 1542   AST 27 04/30/2017 1341   AST 26 07/13/2013 1542   ALT 21 04/30/2017 1341   ALT 32 07/13/2013 1542   BILITOT 0.3 04/30/2017 1341   BILITOT 0.4 07/13/2013 1542     Results for Seaborn, Chrstopher L (MRN [id number]) as of 05/01/2017 13:49  Ref. Range 11/09/2016 13:28 01/08/2017 15:16 02/16/2017 14:03 03/30/2017 14:19 04/30/2017 13:41  Prostatic Specific Antigen Latest Ref Range: 0.00 - 4.00 ng/mL 2.56 5.87 (H) 5.35 (H) 7.47 (H) 8.50 (H)     IMPRESSION: 1. No evidence of local prostate cancer recurrence within the prostate bed. 2. No evidence of metastatic nodal disease in the pelvis or abdomen. 3. No  evidence distant disease. 4. No evidence of active skeletal metastasis. Widespread metastatic sclerotic skeletal lesions without associated radiotracer activity are favored treated  metastasis.   Electronically Signed   By: Suzy Bouchard M.D.   On: 01/23/2017 15:03   ASSESSMENT & PLAN:    Prostate cancer metastatic to bone ([place]) Castrate resistant prostate cancer-  Patient currently on Lupron q3M ; on Zytiga- since Aug 2017.  Jan 2018- bone scan- no significant active disease.  January 2019 aux PET scan shows no significant active disease/which was treated bone metastases.   #Given the progression of disease on Zytiga I would recommend starting Xtandi.  Patient has declined chemotherapy so far along.  Again reviewed the potential side effects of Xtandi including but not limited to seizures/fatigue etc.  #Bone metastases -X-geva  on ca+vit D. No AEs noted. Ca- wnl; last X-geva 2/19; okay to have dental extraction.  # hot flashes-from low testosterone state intolerance to Ditropan.  # Dental work needed- awaiting extraction- amoxicillin   # Pain related to malignancy-Currently stable continue fentanyl patch to 125 mcg every 3 days; continue hydrocodone 10/325 every 8 hours.   # famiily history- Breast cancer [mom-sister x4- breast cancer]; ? Ovarian cancer [dad's sister]; 3 brothers- no knowledge/  Omniseq ordered today [[facility] 2014]  # follow up with 4 weeks/ X-tandi; bmp/PSA; few days prior. Lupron today.      [name], MD 05/15/2017 4:23 PM

## 2017-05-07 ENCOUNTER — Other Ambulatory Visit: Payer: Self-pay | Admitting: Cardiovascular Disease

## 2017-05-22 ENCOUNTER — Other Ambulatory Visit: Payer: Self-pay | Admitting: Internal Medicine

## 2017-05-22 DIAGNOSIS — C61 Malignant neoplasm of prostate: Secondary | ICD-10-CM

## 2017-05-22 DIAGNOSIS — C7951 Secondary malignant neoplasm of bone: Principal | ICD-10-CM

## 2017-05-23 NOTE — Progress Notes (Signed)
Cardiology Office Note Date:  05/24/2017  Patient ID:  Robert Short, Robert Short 1950-05-20, MRN [id number] PCP:  Olin Hauser, DO  Cardiologist:  Dr. [name], MD    Chief Complaint: Follow up  History of Present Illness: Robert Short is a 67 y.o. male with history of CAD with remote PCI to the RCA s/p recent PCI/DES to the distal RCA in 04/2016 at outside hospital, metastatic prostate cancer with bony mets s/p prostatectomy on chemotherapy on chronic opiates, ongoing tobacco abuse, HTN, HLD, chronic neck and back pain, and spinal stenosis who presents for routine follow up of his CAD.   Patient was admitted in 08/2015 with inferior ST elevation MI. Troponin elevation of 0.03. LHC showed severe distal RCA with successful PCI/DES to RPL branch. TTE in 08/2015 showed an EF of 50-55%, unable to exclude RWMA, normal RV size and systolic function. While traveling in [place] in 04/2016, he developed chest pain similar to prior angina as well as sinus bradycardia into the 30s bpm with a 2.5 second pause noted in the ED. Beta blocker was held. He underwent LHC with successful PCI/DES to right PL segment. There were initially plans for PPM, however it was then recommended he be discharged on ASA and Plavix rather than Briltina given his recent bradycardia and off his beta blocker with monitoring of his heart rate as an outpatient. Unable to review echo in [place].   He was admitted in early 08/2016 for atypical chest pain in the setting of URI. He ruled out and outpatient follow up was advised. He was last seen in the office on 08/18/2016 for follow up and was doing well. Heart rate remained mildly bradycardic at 54 bpm. BP 140/78. Weight 184 pounds. It was recommended he decrease his Lopressor to 12.5 mg bid.   Most recent labs from 04/2017 show WBC 5.5, HGB 13.1, PLT 164, Na 137, K+ 3.8, glucose 162, BUN 12, SCr 0.79.  He comes in doing well from a cardiac perspective.  No chest pain.  He does note  worsening exertional fatigue and dyspnea over the past month.  He feels like this may be related to his chemotherapy medications.  Weight has been stable.  At times he does no early satiety.  No lower extremity swelling, abdominal distention, orthopnea, cough, or PND.  Earlier in May, he was not eating or drinking well and became dehydrated.  In the setting, his wife noticed his blood pressures were running lower than he typically does with a low reading of 108/58.  Patient felt orthostatic at that time.  Since then, his wife has been giving him lemonade regularly with improvement in symptoms.  Patient is planning to have multiple dental extractions performed in the near future secondary to abscessed teeth and fractured teeth.   Past Medical History:  Diagnosis Date  . Back pain 10/09/2012  . Bone cancer ([place])   . CAD (coronary artery disease)    a. 08/2015 PCI: LM nl, LAD 20d, LCX nl, RCA 38m, RPAV 95 (3.0x18 Xience Alpine DES);  b. 04/2016 PCI [facility]): RPL 95 (2.75x8 Promus Premier DES).  . Cancer associated pain   . Depression   . History of echocardiogram    a. 08/2016 Echo: EF 50-55%.  Marland Kitchen History of kidney stones   . Hyperlipidemia   . Hypertension   . Joint pain   . Prostate cancer [facility])    a.  s/p prostatectomy ([facility]);  b. bone mets noted 04/2016.  . Right arm  pain 01/10/2016  . Right foot pain 01/10/2016  . Right leg pain 01/10/2016  . Tobacco abuse     Past Surgical History:  Procedure Laterality Date  . CARDIAC CATHETERIZATION     [facility]  . CARDIAC CATHETERIZATION N/A 08/16/2015   Procedure: Left Heart Cath and Coronary Angiography;  Surgeon: Yolonda Kida, MD;  Location: [place] [facility];  Service: Cardiovascular;  Laterality: N/A;  . CARDIAC CATHETERIZATION N/A 08/16/2015   Procedure: Coronary Stent Intervention;  Surgeon: Yolonda Kida, MD;  Location: [place] [facility];  Service: Cardiovascular;  Laterality: N/A;  . CERVIX SURGERY    . PROSTATECTOMY    . SPINE  SURGERY    . TONSILLECTOMY    . WRIST SURGERY      Current Meds  Medication Sig  . acetaminophen (TYLENOL) 500 MG tablet Take 1,000 mg by mouth every 8 (eight) hours as needed.   Marland Kitchen albuterol (PROVENTIL HFA;VENTOLIN HFA) 108 (90 Base) MCG/ACT inhaler Inhale 1-2 puffs into the lungs every 6 (six) hours as needed for wheezing or shortness of breath.  Marland Kitchen aspirin 81 MG tablet Take 81 mg by mouth daily.  Marland Kitchen atorvastatin (LIPITOR) 80 MG tablet Take 1 tablet (80 mg total) by mouth daily at 6 PM.  . clopidogrel (PLAVIX) 75 MG tablet TAKE 1 TABLET (75 MG TOTAL) BY MOUTH DAILY.  Marland Kitchen enzalutamide (XTANDI) 40 MG capsule Take 4 capsules (160 mg total) by mouth daily.  . fentaNYL (DURAGESIC - DOSED MCG/HR) 100 MCG/HR Place 1 patch (100 mcg total) onto the skin every 3 (three) days. Along with 11mcg patch every 3 days.  . fentaNYL (DURAGESIC - DOSED MCG/HR) 25 MCG/HR patch Place 1 patch (25 mcg total) onto the skin every 3 (three) days. Use this along with fentanyl patch 100 mcg [total 125 mcg] every 3 days  . HYDROcodone-acetaminophen (NORCO) 10-325 MG tablet Take 1 tablet by mouth every 8 (eight) hours as needed.  Marland Kitchen lisinopril (PRINIVIL,ZESTRIL) 2.5 MG tablet TAKE 1 TABLET (2.5 MG TOTAL) BY MOUTH DAILY.  . metoprolol tartrate (LOPRESSOR) 25 MG tablet Take 0.5 tablets (12.5 mg total) by mouth 2 (two) times daily.  . nitroGLYCERIN (NITROSTAT) 0.4 MG SL tablet Place 0.4 mg under the tongue every 5 (five) minutes as needed.   Marland Kitchen oxybutynin (DITROPAN) 5 MG tablet Take 1 tablet (5 mg total) by mouth 2 (two) times daily.  . pantoprazole (PROTONIX) 40 MG tablet Take 1 tablet (40 mg total) by mouth daily at 6 (six) AM.  . polyethylene glycol (MIRALAX / GLYCOLAX) packet Take 17 g by mouth daily as needed for moderate constipation.  . promethazine (PHENERGAN) 12.5 MG tablet Take 1-2 tablets (12.5-25 mg total) by mouth every 8 (eight) hours as needed for nausea or vomiting.  . Wheat Dextrin (BENEFIBER) POWD Stir 2 tsp. TID  into 4-8 oz of any non-carbonated beverage or soft food (hot or cold)    Allergies:   Patient has no known allergies.   Social History:  The patient  reports that he has been smoking cigarettes.  He has a 40.00 pack-year smoking history. He uses smokeless tobacco. He reports that he does not drink alcohol or use drugs.   Family History:  The patient's family history includes Heart attack in his father and mother; Hypertension in his mother.  ROS:   Review of Systems  Constitutional: Positive for malaise/fatigue. Negative for chills, diaphoresis, fever and weight loss.  HENT: Negative for congestion.   Eyes: Negative for discharge and redness.  Respiratory: Positive for shortness of breath. Negative for cough, hemoptysis, sputum production and wheezing.   Cardiovascular: Negative for chest pain, palpitations, orthopnea, claudication, leg swelling and PND.  Gastrointestinal: Negative for abdominal pain, blood in stool, heartburn, melena, nausea and vomiting.  Genitourinary: Negative for hematuria.  Musculoskeletal: Positive for back pain, joint pain, myalgias and neck pain. Negative for falls.  Skin: Negative for rash.  Neurological: Positive for weakness. Negative for dizziness, tingling, tremors, sensory change, speech change, focal weakness and loss of consciousness.  Endo/Heme/Allergies: Does not bruise/bleed easily.  Psychiatric/Behavioral: Negative for substance abuse. The patient is not nervous/anxious.   All other systems reviewed and are negative.    PHYSICAL EXAM:  VS:  BP 138/72 (BP Location: Left Arm, Patient Position: Sitting, Cuff Size: Normal)   Pulse 67   Ht 5\' 11"  (1.803 m)   Wt 177 lb 8 oz (80.5 kg)   BMI 24.76 kg/m  BMI: Body mass index is 24.76 kg/m.  Physical Exam  Constitutional: He is oriented to person, place, and time. He appears well-developed and well-nourished.  HENT:  Head: Normocephalic and atraumatic.  Mouth/Throat: Abnormal dentition.  Eyes: Right  eye exhibits no discharge. Left eye exhibits no discharge.  Neck: Normal range of motion. No JVD present.  Cardiovascular: Normal rate, regular rhythm, S1 normal, S2 normal and normal heart sounds. Exam reveals no distant heart sounds, no friction rub, no midsystolic click and no opening snap.  No murmur heard. Pulses:      Posterior tibial pulses are 2+ on the right side, and 2+ on the left side.  Pulmonary/Chest: Effort normal and breath sounds normal. No respiratory distress. He has no decreased breath sounds. He has no wheezes. He has no rales. He exhibits no tenderness.  Abdominal: Soft. He exhibits no distension. There is no tenderness.  Musculoskeletal: He exhibits no edema.  Neurological: He is alert and oriented to person, place, and time.  Skin: Skin is warm and dry. No cyanosis. Nails show no clubbing.  Psychiatric: He has a normal mood and affect. His speech is normal and behavior is normal. Judgment and thought content normal.     EKG:  Was ordered and interpreted by me today. Shows NSR, 67 bpm, no acute ST-T change  Recent Labs: 05/24/2017: ALT 16; BUN 19; Creatinine, Ser 0.83; Hemoglobin 12.8; Platelets 176; Potassium 4.0; Sodium 138  No results found for requested labs within last 8760 hours.   Estimated Creatinine Clearance: 93.2 mL/min (by C-G formula based on SCr of 0.83 mg/dL).   Wt Readings from Last 3 Encounters:  05/24/17 177 lb 8 oz (80.5 kg)  05/01/17 184 lb 4.9 oz (83.6 kg)  04/18/17 183 lb (83 kg)     Other studies reviewed: Additional studies/records reviewed today include: summarized above  ASSESSMENT AND PLAN:  1. Preprocedure cardiac evaluation: Patient is preparing to have multiple dental extractions performed.  He has no symptoms concerning for angina at this time.  He is greater than 12 months out from his most recent PCI.  Advised patient he would be acceptable risk to hold Plavix for 5 days prior to dental extractions.  Resumption of Plavix has  been deferred to the MD performing the procedure.  Patient is aware of increased adverse event off of Plavix.  2. CAD of native coronary arteries without angina: No symptoms concerning for angina at this time.  Long-term, he will continue dual antiplatelet therapy with aspirin and Plavix.  He is acceptable risk to hold Plavix for multiple  dental extractions as above.  Continue secondary prevention and aggressive risk factor modification.  No plans for ischemic evaluation at this time.  3. Fatigue/shortness of breath: Likely in the setting of his metastatic prostate cancer with chemotherapy.  Check echocardiogram to evaluate LV systolic function, valvular function, and right-sided pressure.  4. Hypertension: Blood pressure reasonably controlled today.  However, given prior history of decreased p.o. intake and dehydration with symptomatic orthostasis, we have elected to discontinue lisinopril at this time.  He will continue metoprolol 12.5 mg twice daily.  5. Hyperlipidemia: Continue Lipitor 80 mg daily.  Recent liver function normal.  6. Metastatic prostate cancer: Likely playing a significant role and his complaints such as fatigue, myalgias, and joint pains.  Followed by oncology.  7. Tobacco abuse: Complete cessation is advised.  Disposition: F/u with Dr. [name] in 6 months.  Current medicines are reviewed at length with the patient today.  The patient did not have any concerns regarding medicines.  Signed, Christell Faith, PA-C 05/24/2017 2:50 PM     [address] [place],  [id number] [phone]

## 2017-05-24 ENCOUNTER — Encounter: Payer: Self-pay | Admitting: Physician Assistant

## 2017-05-24 ENCOUNTER — Inpatient Hospital Stay: Payer: 59 | Attending: Oncology

## 2017-05-24 ENCOUNTER — Ambulatory Visit (INDEPENDENT_AMBULATORY_CARE_PROVIDER_SITE_OTHER): Payer: 59 | Admitting: Physician Assistant

## 2017-05-24 VITALS — BP 138/72 | HR 67 | Ht 71.0 in | Wt 177.5 lb

## 2017-05-24 DIAGNOSIS — C61 Malignant neoplasm of prostate: Secondary | ICD-10-CM | POA: Insufficient documentation

## 2017-05-24 DIAGNOSIS — I252 Old myocardial infarction: Secondary | ICD-10-CM | POA: Insufficient documentation

## 2017-05-24 DIAGNOSIS — G893 Neoplasm related pain (acute) (chronic): Secondary | ICD-10-CM | POA: Diagnosis not present

## 2017-05-24 DIAGNOSIS — I2510 Atherosclerotic heart disease of native coronary artery without angina pectoris: Secondary | ICD-10-CM

## 2017-05-24 DIAGNOSIS — C7951 Secondary malignant neoplasm of bone: Secondary | ICD-10-CM | POA: Insufficient documentation

## 2017-05-24 DIAGNOSIS — Z01810 Encounter for preprocedural cardiovascular examination: Secondary | ICD-10-CM | POA: Diagnosis not present

## 2017-05-24 DIAGNOSIS — Z955 Presence of coronary angioplasty implant and graft: Secondary | ICD-10-CM | POA: Diagnosis not present

## 2017-05-24 DIAGNOSIS — Z7902 Long term (current) use of antithrombotics/antiplatelets: Secondary | ICD-10-CM | POA: Diagnosis not present

## 2017-05-24 DIAGNOSIS — R0602 Shortness of breath: Secondary | ICD-10-CM | POA: Diagnosis not present

## 2017-05-24 DIAGNOSIS — Z79899 Other long term (current) drug therapy: Secondary | ICD-10-CM | POA: Diagnosis not present

## 2017-05-24 DIAGNOSIS — R9721 Rising PSA following treatment for malignant neoplasm of prostate: Secondary | ICD-10-CM | POA: Diagnosis not present

## 2017-05-24 DIAGNOSIS — F17210 Nicotine dependence, cigarettes, uncomplicated: Secondary | ICD-10-CM | POA: Insufficient documentation

## 2017-05-24 DIAGNOSIS — E7800 Pure hypercholesterolemia, unspecified: Secondary | ICD-10-CM

## 2017-05-24 DIAGNOSIS — I208 Other forms of angina pectoris: Secondary | ICD-10-CM

## 2017-05-24 DIAGNOSIS — I10 Essential (primary) hypertension: Secondary | ICD-10-CM | POA: Diagnosis not present

## 2017-05-24 DIAGNOSIS — R5383 Other fatigue: Secondary | ICD-10-CM | POA: Diagnosis not present

## 2017-05-24 LAB — COMPREHENSIVE METABOLIC PANEL
ALK PHOS: 36 U/L — AB (ref 38–126)
ALT: 16 U/L — AB (ref 17–63)
ANION GAP: 10 (ref 5–15)
AST: 24 U/L (ref 15–41)
Albumin: 4.3 g/dL (ref 3.5–5.0)
BILIRUBIN TOTAL: 0.5 mg/dL (ref 0.3–1.2)
BUN: 19 mg/dL (ref 6–20)
CALCIUM: 9.1 mg/dL (ref 8.9–10.3)
CHLORIDE: 103 mmol/L (ref 101–111)
CO2: 25 mmol/L (ref 22–32)
Creatinine, Ser: 0.83 mg/dL (ref 0.61–1.24)
GFR calc non Af Amer: >60 mL/min (ref >60)
Glucose, Bld: 147 mg/dL — ABNORMAL HIGH (ref 65–99)
Potassium: 4 mmol/L (ref 3.5–5.1)
SODIUM: 138 mmol/L (ref 135–145)
Total Protein: 6.7 g/dL (ref 6.5–8.1)

## 2017-05-24 LAB — CBC WITH DIFFERENTIAL/PLATELET
BASOS PCT: 1 %
Basophils Absolute: 0 10*3/uL (ref 0–0.1)
Eosinophils Absolute: 0.1 10*3/uL (ref 0–0.7)
Eosinophils Relative: 2 %
HEMATOCRIT: 38.5 % — AB (ref 40.0–52.0)
HEMOGLOBIN: 12.8 g/dL — AB (ref 13.0–18.0)
LYMPHS ABS: 0.9 10*3/uL — AB (ref 1.0–3.6)
Lymphocytes Relative: 15 %
MCH: 32.1 pg (ref 26.0–34.0)
MCHC: 33.3 g/dL (ref 32.0–36.0)
MCV: 96.4 fL (ref 80.0–100.0)
MONOS PCT: 8 %
Monocytes Absolute: 0.4 10*3/uL (ref 0.2–1.0)
NEUTROS ABS: 4.3 10*3/uL (ref 1.4–6.5)
NEUTROS PCT: 74 %
Platelets: 176 10*3/uL (ref 150–440)
RBC: 4 MIL/uL — ABNORMAL LOW (ref 4.40–5.90)
RDW: 14.1 % (ref 11.5–14.5)
WBC: 5.7 10*3/uL (ref 3.8–10.6)

## 2017-05-24 LAB — PSA: Prostatic Specific Antigen: 8.27 ng/mL — ABNORMAL HIGH (ref 0.00–4.00)

## 2017-05-24 NOTE — Patient Instructions (Addendum)
It is ok to stop Plavix for 5 days prior to dental extraction and to resume when ok with the Dentist.   Medication Instructions:  Your physician has recommended you make the following change in your medication:  1- STOP Lisinopril.   Labwork: none  Testing/Procedures: Your physician has requested that you have an echocardiogram. Echocardiography is a painless test that uses sound waves to create images of your heart. It provides your doctor with information about the size and shape of your heart and how well your heart's chambers and valves are working. This procedure takes approximately one hour. There are no restrictions for this procedure. You may get an IV, if needed, to receive an ultrasound enhancing agent through to better visualize your heart.     Follow-Up: Your physician wants you to follow-up in: [place]. You will receive a reminder letter in the mail two months in advance. If you don't receive a letter, please call our office to schedule the follow-up appointment.  If you need a refill on your cardiac medications before your next appointment, please call your pharmacy.

## 2017-05-29 ENCOUNTER — Inpatient Hospital Stay (HOSPITAL_BASED_OUTPATIENT_CLINIC_OR_DEPARTMENT_OTHER): Payer: 59 | Admitting: Internal Medicine

## 2017-05-29 ENCOUNTER — Telehealth: Payer: Self-pay | Admitting: Internal Medicine

## 2017-05-29 DIAGNOSIS — E785 Hyperlipidemia, unspecified: Secondary | ICD-10-CM | POA: Diagnosis not present

## 2017-05-29 DIAGNOSIS — Z955 Presence of coronary angioplasty implant and graft: Secondary | ICD-10-CM | POA: Diagnosis not present

## 2017-05-29 DIAGNOSIS — C7951 Secondary malignant neoplasm of bone: Secondary | ICD-10-CM | POA: Diagnosis not present

## 2017-05-29 DIAGNOSIS — F17210 Nicotine dependence, cigarettes, uncomplicated: Secondary | ICD-10-CM

## 2017-05-29 DIAGNOSIS — Z7902 Long term (current) use of antithrombotics/antiplatelets: Secondary | ICD-10-CM

## 2017-05-29 DIAGNOSIS — I2510 Atherosclerotic heart disease of native coronary artery without angina pectoris: Secondary | ICD-10-CM | POA: Diagnosis not present

## 2017-05-29 DIAGNOSIS — G893 Neoplasm related pain (acute) (chronic): Secondary | ICD-10-CM

## 2017-05-29 DIAGNOSIS — I252 Old myocardial infarction: Secondary | ICD-10-CM | POA: Diagnosis not present

## 2017-05-29 DIAGNOSIS — C61 Malignant neoplasm of prostate: Secondary | ICD-10-CM | POA: Diagnosis not present

## 2017-05-29 DIAGNOSIS — Z79899 Other long term (current) drug therapy: Secondary | ICD-10-CM

## 2017-05-29 DIAGNOSIS — I10 Essential (primary) hypertension: Secondary | ICD-10-CM

## 2017-05-29 DIAGNOSIS — R9721 Rising PSA following treatment for malignant neoplasm of prostate: Secondary | ICD-10-CM | POA: Diagnosis not present

## 2017-05-29 MED ORDER — FENTANYL 25 MCG/HR TD PT72: 25.0000 ug | MEDICATED_PATCH | TRANSDERMAL | 0 refills | Status: DC

## 2017-05-29 MED ORDER — HYDROCODONE-ACETAMINOPHEN 10-325 MG PO TABS: 1.0000 | ORAL_TABLET | Freq: Three times a day (TID) | ORAL | 0 refills | Status: DC | PRN

## 2017-05-29 MED ORDER — FENTANYL 100 MCG/HR TD PT72: 100.0000 ug | MEDICATED_PATCH | TRANSDERMAL | 0 refills | Status: DC

## 2017-05-29 NOTE — Telephone Encounter (Signed)
Heather/Brooke-please check if we have ordered Omniseq on this patient.  If not-please order-pathology 2011; at [facility]. thx

## 2017-05-29 NOTE — Progress Notes (Signed)
Robert Short OFFICE PROGRESS NOTE  Patient Care Team: Olin Hauser, [name], [name], [name]  Cancer Staging No matching staging information was found for the patient.   Oncology History   # 2011- PROSTATE CANCER [Gleason 3+4]; s/p Prostatectomy [ also involved bladder neck/ECP; Dr.Polaseck]; July 2014- Biochemical recurrence [PSA 14]- started on Zoladex [Dr.Pandit]; lost to follow up.  # JAN 2017- STAGE IV METASTATIC PROSTATE Cancer to Bone- Feb 13th, 2017-  Lupron q 54m [~end of feb]; PSA: 1021; Declined Chemo; April 2017 [xofigo x6; Dr.Crystal]; AUG 2017- Zytiga + Prednisone BID. Bone scan-Jan 2018- improved skeletal metastases.   # MAY 1st 2019- START X-tandi [stopped zytiga/PSA- 8.8/rising]  # Mets to bone- start X-geva q 36M [May 30th]  # Smoker/ chronic pain/pain clinic   #[April 2018; [place]]- s/p stenting; on Plavix; --------------------------------------------------------------  DIAGNOSIS: [ ]  Castrate resistant prostate cancer  STAGE:   4    ;GOALS: Palliative  CURRENT/MOST RECENT THERAPY [April 2019]-Xtandi      Prostate cancer metastatic to bone ([place])   12/08/2014 Initial Diagnosis    Prostate cancer metastatic to bone [facility])         INTERVAL HISTORY:  Robert Short 67 y.o.  male pleasant patient above history of castrate resistant prostate cancer currently on Xtandi is here for follow-up.  Patient complains of mild to moderate fatigue.  Mild nausea.  Complains of cramping in the legs.  Complains of back pain/joint pains are chronic.  Denies any seizures.  Review of Systems  Constitutional: Positive for malaise/fatigue. Negative for chills, diaphoresis, fever and weight loss.  HENT: Negative for nosebleeds and sore throat.   Eyes: Negative for double vision.  Respiratory: Negative for  cough, hemoptysis, sputum production, shortness of breath and wheezing.   Cardiovascular: Negative for chest pain, palpitations, orthopnea and leg swelling.  Gastrointestinal: Positive for nausea. Negative for abdominal pain, blood in stool, constipation, diarrhea, heartburn, melena and vomiting.  Genitourinary: Negative for dysuria, frequency and urgency.  Musculoskeletal: Positive for back pain and joint pain.  Skin: Negative.  Negative for itching and rash.  Neurological: Positive for dizziness. Negative for tingling, focal weakness, weakness and headaches.  Endo/Heme/Allergies: Does not bruise/bleed easily.  Psychiatric/Behavioral: Negative for depression. The patient is not nervous/anxious and does not have insomnia.       PAST MEDICAL HISTORY :  Past Medical History:  Diagnosis Date  . Back pain 10/09/2012  . Bone cancer ([place])   . CAD (coronary artery disease)    a. 08/2015 PCI: LM nl, LAD 20d, LCX nl, RCA 37m, RPAV 95 (3.0x18 Xience Alpine DES);  b. 04/2016 PCI [facility]): RPL 95 (2.75x8 Promus Premier DES).  . Cancer associated pain   . Depression   . History of echocardiogram    a. 08/2016 Echo: EF 50-55%.  Marland Kitchen History of kidney stones   . Hyperlipidemia   . Hypertension   . Joint pain   . Prostate cancer [facility])    a.  s/p prostatectomy ([facility]);  b. bone mets noted 04/2016.  . Right arm pain 01/10/2016  . Right foot pain 01/10/2016  . Right leg pain 01/10/2016  . Tobacco abuse     PAST SURGICAL HISTORY :   Past Surgical History:  Procedure Laterality Date  . CARD[facility]AC CAT[facility]TERIZATION     [facility]  . CARDIAC CATHETERIZATION N/A 08/16/2015   Procedure: Left Heart  Cath and Coronary Angiography;  Surgeon: Yolonda Kida, MD;  Location: [place] [facility];  Service: Cardiovascular;  Laterality: N/A;  . CARDIAC CATHETERIZATION N/A 08/16/2015   Procedure: Coronary Stent Intervention;  Surgeon: Yolonda Kida, MD;  Location: [place] [facility];  Service: Cardiovascular;   Laterality: N/A;  . CERVIX SURGERY    . PROSTATECTOMY    . SPINE SURGERY    . TONSILLECTOMY    . WRIST SURGERY      FAMILY HISTORY :   Family History  Problem Relation Age of Onset  . Heart attack Mother   . Hypertension Mother   . Heart attack Father     SOCIAL HISTORY:   Social History   Tobacco Use  . Smoking status: Current Every Day Smoker    Packs/day: 1.00    Years: 40.00    Pack years: 40.00    Types: Cigarettes  . Smokeless tobacco: Current User  Substance Use Topics  . Alcohol use: No    Comment: occasionally  . Drug use: No    ALLERGIES:  has No Known Allergies.  MEDICATIONS:  Current Outpatient Medications  Medication Sig Dispense Refill  . acetaminophen (TYLENOL) 500 MG tablet Take 1,000 mg by mouth every 8 (eight) hours as needed.     Marland Kitchen albuterol (PROVENTIL HFA;VENTOLIN HFA) 108 (90 Base) MCG/ACT inhaler Inhale 1-2 puffs into the lungs every 6 (six) hours as needed for wheezing or shortness of breath. 1 Inhaler 0  . aspirin 81 MG tablet Take 81 mg by mouth daily.    Marland Kitchen atorvastatin (LIPITOR) 80 MG tablet Take 1 tablet (80 mg total) by mouth daily at 6 PM. 90 tablet 3  . clopidogrel (PLAVIX) 75 MG tablet TAKE 1 TABLET (75 MG TOTAL) BY MOUTH DAILY. 90 tablet 2  . enzalutamide (XTANDI) 40 MG capsule Take 4 capsules (160 mg total) by mouth daily. 120 capsule 0  . fentaNYL (DURAGESIC - DOSED MCG/HR) 100 MCG/HR Place 1 patch (100 mcg total) onto the skin every 3 (three) days. Along with 42mcg patch every 3 days. 10 patch 0  . fentaNYL (DURAGESIC - DOSED MCG/HR) 25 MCG/HR patch Place 1 patch (25 mcg total) onto the skin every 3 (three) days. Use this along with fentanyl patch 100 mcg [total 125 mcg] every 3 days 10 patch 0  . HYDROcodone-acetaminophen (NORCO) 10-325 MG tablet Take 1 tablet by mouth every 8 (eight) hours as needed. 90 tablet 0  . metoprolol tartrate (LOPRESSOR) 25 MG tablet Take 0.5 tablets (12.5 mg total) by mouth 2 (two) times daily. 30 tablet 3   . nitroGLYCERIN (NITROSTAT) 0.4 MG SL tablet Place 0.4 mg under the tongue every 5 (five) minutes as needed.     Marland Kitchen oxybutynin (DITROPAN) 5 MG tablet Take 1 tablet (5 mg total) by mouth 2 (two) times daily. 60 tablet 4  . pantoprazole (PROTONIX) 40 MG tablet Take 1 tablet (40 mg total) by mouth daily at 6 (six) AM. 30 tablet 0  . polyethylene glycol (MIRALAX / GLYCOLAX) packet Take 17 g by mouth daily as needed for moderate constipation.    . promethazine (PHENERGAN) 12.5 MG tablet Take 1-2 tablets (12.5-25 mg total) by mouth every 8 (eight) hours as needed for nausea or vomiting. 60 tablet 1  . Wheat Dextrin (BENEFIBER) POWD Stir 2 tsp. TID into 4-8 oz of any non-carbonated beverage or soft food (hot or cold) 500 g PRN   No current facility-administered medications for this visit.  PHYSICAL EXAMINATION: ECOG PERFORMANCE STATUS: 1 - Symptomatic but completely ambulatory  BP (!) 149/89 (BP Location: Left Arm, Patient Position: Sitting)   Pulse 73   Temp 98 F (36.7 C) (Tympanic)   Resp 16   Wt 176 lb 0.6 oz (79.9 kg)   BMI 24.55 kg/m   Filed Weights   05/29/17 1408  Weight: 176 lb 0.6 oz (79.9 kg)    GENERAL: Well-nourished well-developed; Alert, no distress and comfortable.  Accompanied by his wife.  Walks with a cane. EYES: no pallor or icterus OROPHARYNX: no thrush or ulceration; NECK: supple; no lymph nodes felt. LYMPH:  no palpable lymphadenopathy in the axillary or inguinal regions LUNGS: Decreased breath sounds auscultation bilaterally. No wheeze or crackles HEART/CVS: regular rate & rhythm and no murmurs; No lower extremity edema ABDOMEN:abdomen soft, non-tender and normal bowel sounds. No hepatomegaly or splenomegaly.  Musculoskeletal:no cyanosis of digits and no clubbing  PSYCH: alert & oriented x 3 with fluent speech NEURO: no focal motor/sensory deficits SKIN:  no rashes or significant lesions    LABORATORY DATA:  I have reviewed the data as listed     Component Value Date/Time   NA 138 05/24/2017 1309   NA 140 07/13/2013 1542   K 4.0 05/24/2017 1309   K 4.3 07/13/2013 1542   CL 103 05/24/2017 1309   CL 107 07/13/2013 1542   CO2 25 05/24/2017 1309   CO2 26 07/13/2013 1542   GLUCOSE 147 (H) 05/24/2017 1309   GLUCOSE 106 (H) 07/13/2013 1542   BUN 19 05/24/2017 1309   BUN 11 07/13/2013 1542   CREATININE 0.83 05/24/2017 1309   CREATININE 1.01 07/13/2013 1542   CALCIUM 9.1 05/24/2017 1309   CALCIUM 9.9 07/13/2013 1542   PROT 6.7 05/24/2017 1309   PROT 8.1 07/13/2013 1542   ALBUMIN 4.3 05/24/2017 1309   ALBUMIN 4.3 07/13/2013 1542   AST 24 05/24/2017 1309   AST 26 07/13/2013 1542   ALT 16 (L) 05/24/2017 1309   ALT 32 07/13/2013 1542   ALKPHOS 36 (L) 05/24/2017 1309   ALKPHOS 74 07/13/2013 1542   BILITOT 0.5 05/24/2017 1309   BILITOT 0.4 07/13/2013 1542   GFRNONAA >60 05/24/2017 1309   GFRNONAA >60 07/13/2013 1542   GFRAA >60 05/24/2017 1309   GFRAA >60 07/13/2013 1542    No results found for: SPEP, UPEP  Lab Results  Component Value Date   WBC 5.7 05/24/2017   NEUTROABS 4.3 05/24/2017   HGB 12.8 (L) 05/24/2017   HCT 38.5 (L) 05/24/2017   MCV 96.4 05/24/2017   PLT 176 05/24/2017      Chemistry      Component Value Date/Time   NA 138 05/24/2017 1309   NA 140 07/13/2013 1542   K 4.0 05/24/2017 1309   K 4.3 07/13/2013 1542   CL 103 05/24/2017 1309   CL 107 07/13/2013 1542   CO2 25 05/24/2017 1309   CO2 26 07/13/2013 1542   BUN 19 05/24/2017 1309   BUN 11 07/13/2013 1542   CREATININE 0.83 05/24/2017 1309   CREATININE 1.01 07/13/2013 1542      Component Value Date/Time   CALCIUM 9.1 05/24/2017 1309   CALCIUM 9.9 07/13/2013 1542   ALKPHOS 36 (L) 05/24/2017 1309   ALKPHOS 74 07/13/2013 1542   AST 24 05/24/2017 1309   AST 26 07/13/2013 1542   ALT 16 (L) 05/24/2017 1309   ALT 32 07/13/2013 1542   BILITOT 0.5 05/24/2017 1309   BILITOT 0.4 07/13/2013 1542  Results for WASEEM, SUESS (MRN [id number]) as  of 05/29/2017 14:30  Ref. Range 01/08/2017 15:16 02/16/2017 14:03 03/30/2017 14:19 04/30/2017 13:41 05/24/2017 13:09  Prostatic Specific Antigen Latest Ref Range: 0.00 - 4.00 ng/mL 5.87 (H) 5.35 (H) 7.47 (H) 8.50 (H) 8.27 (H)    RADIOGRAPHIC STUDIES: I have personally reviewed the radiological images as listed and agreed with the findings in the report. No results found.   ASSESSMENT & PLAN:  Prostate cancer metastatic to bone ([place]) Castrate resistant prostate cancer-Jan 2018- bone scan- no significant active disease.  January 2019 aux PET scan shows no significant active disease/which was treated bone metastases.   # currently on X-tandi; Lupron every 3 months; PSA improving/stable.   #Bone metastases -X-geva  on ca+vit D. No AEs noted. Ca- wnl; last X-geva 4/30.  Stable  # Pain related to malignancy- continue fentanyl patch to 125 mcg every 3 days; continue hydrocodone 10/325 every 8 hours.  Stable new prescription given.  #Myalgias-question Xtandi; will not make any changes to the dose at this time.  # follow up with 4 weeks/ X-tandi; bmp/PSA; few days prior.   No orders of the defined types were placed in this encounter.  All questions were answered. The patient knows to call the clinic with any problems, questions or concerns.      [name], MD 05/29/2017 4:06 PM

## 2017-05-29 NOTE — Assessment & Plan Note (Addendum)
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  January 2019 aux PET scan shows no significant active disease/which was treated bone metastases.   # currently on X-tandi; Lupron every 3 months; PSA improving/stable.   #Bone metastases -X-geva  on ca+vit D. No AEs noted. Ca- wnl; last X-geva 4/30.  Stable  # Pain related to malignancy- continue fentanyl patch to 125 mcg every 3 days; continue hydrocodone 10/325 every 8 hours.  Stable new prescription given.  #Myalgias-question Xtandi; will not make any changes to the dose at this time.  # follow up with 4 weeks/ X-tandi; bmp/PSA; few days prior.

## 2017-05-30 ENCOUNTER — Other Ambulatory Visit: Payer: Self-pay | Admitting: Internal Medicine

## 2017-05-30 DIAGNOSIS — C7951 Secondary malignant neoplasm of bone: Principal | ICD-10-CM

## 2017-05-30 DIAGNOSIS — C61 Malignant neoplasm of prostate: Secondary | ICD-10-CM

## 2017-05-30 MED FILL — XTANDI 40 MG CAPSULE: 40 | 30 days supply | Qty: 120 | Fill #0

## 2017-05-30 NOTE — Telephone Encounter (Signed)
Per Dr. Reuel Derby, the [facility] has not yet been ordered on this patient.

## 2017-06-01 NOTE — Telephone Encounter (Signed)
Order faxed to [facility] [facility]

## 2017-06-05 ENCOUNTER — Other Ambulatory Visit: Payer: Self-pay

## 2017-06-05 ENCOUNTER — Ambulatory Visit (INDEPENDENT_AMBULATORY_CARE_PROVIDER_SITE_OTHER): Payer: 59

## 2017-06-05 DIAGNOSIS — R0602 Shortness of breath: Secondary | ICD-10-CM

## 2017-06-21 ENCOUNTER — Other Ambulatory Visit: Payer: Self-pay | Admitting: *Deleted

## 2017-06-21 DIAGNOSIS — C7951 Secondary malignant neoplasm of bone: Principal | ICD-10-CM

## 2017-06-21 DIAGNOSIS — C61 Malignant neoplasm of prostate: Secondary | ICD-10-CM

## 2017-06-22 ENCOUNTER — Inpatient Hospital Stay: Payer: 59 | Attending: Internal Medicine

## 2017-06-22 DIAGNOSIS — C61 Malignant neoplasm of prostate: Secondary | ICD-10-CM | POA: Diagnosis not present

## 2017-06-22 DIAGNOSIS — C7951 Secondary malignant neoplasm of bone: Secondary | ICD-10-CM | POA: Diagnosis not present

## 2017-06-22 DIAGNOSIS — Z79899 Other long term (current) drug therapy: Secondary | ICD-10-CM | POA: Insufficient documentation

## 2017-06-22 DIAGNOSIS — R110 Nausea: Secondary | ICD-10-CM | POA: Insufficient documentation

## 2017-06-22 DIAGNOSIS — R5383 Other fatigue: Secondary | ICD-10-CM | POA: Insufficient documentation

## 2017-06-22 DIAGNOSIS — G893 Neoplasm related pain (acute) (chronic): Secondary | ICD-10-CM | POA: Insufficient documentation

## 2017-06-22 LAB — CBC WITH DIFFERENTIAL/PLATELET
BASOS ABS: 0 10*3/uL (ref 0–0.1)
Basophils Relative: 1 %
EOS ABS: 0.2 10*3/uL (ref 0–0.7)
EOS PCT: 3 %
HCT: 39.8 % — ABNORMAL LOW (ref 40.0–52.0)
Hemoglobin: 13.4 g/dL (ref 13.0–18.0)
LYMPHS ABS: 1 10*3/uL (ref 1.0–3.6)
Lymphocytes Relative: 16 %
MCH: 32.2 pg (ref 26.0–34.0)
MCHC: 33.7 g/dL (ref 32.0–36.0)
MCV: 95.7 fL (ref 80.0–100.0)
MONO ABS: 0.6 10*3/uL (ref 0.2–1.0)
Monocytes Relative: 9 %
Neutro Abs: 4.5 10*3/uL (ref 1.4–6.5)
Neutrophils Relative %: 71 %
Platelets: 181 10*3/uL (ref 150–440)
RBC: 4.16 MIL/uL — AB (ref 4.40–5.90)
RDW: 13.6 % (ref 11.5–14.5)
WBC: 6.2 10*3/uL (ref 3.8–10.6)

## 2017-06-22 LAB — COMPREHENSIVE METABOLIC PANEL
ALBUMIN: 4 g/dL (ref 3.5–5.0)
ALT: 17 U/L (ref 17–63)
AST: 25 U/L (ref 15–41)
Alkaline Phosphatase: 39 U/L (ref 38–126)
Anion gap: 10 (ref 5–15)
BILIRUBIN TOTAL: 0.5 mg/dL (ref 0.3–1.2)
BUN: 12 mg/dL (ref 6–20)
CO2: 23 mmol/L (ref 22–32)
CREATININE: 0.89 mg/dL (ref 0.61–1.24)
Calcium: 9.1 mg/dL (ref 8.9–10.3)
Chloride: 102 mmol/L (ref 101–111)
GFR calc Af Amer: >60 mL/min (ref >60)
GLUCOSE: 161 mg/dL — AB (ref 65–99)
Potassium: 4.3 mmol/L (ref 3.5–5.1)
Sodium: 135 mmol/L (ref 135–145)
TOTAL PROTEIN: 6.7 g/dL (ref 6.5–8.1)

## 2017-06-23 LAB — PSA: PROSTATIC SPECIFIC ANTIGEN: 8.56 ng/mL — AB (ref 0.00–4.00)

## 2017-06-25 ENCOUNTER — Other Ambulatory Visit: Payer: 59

## 2017-06-25 ENCOUNTER — Other Ambulatory Visit: Payer: Self-pay | Admitting: Internal Medicine

## 2017-06-25 DIAGNOSIS — C7951 Secondary malignant neoplasm of bone: Principal | ICD-10-CM

## 2017-06-25 DIAGNOSIS — C61 Malignant neoplasm of prostate: Secondary | ICD-10-CM

## 2017-06-25 DIAGNOSIS — G893 Neoplasm related pain (acute) (chronic): Secondary | ICD-10-CM

## 2017-06-25 MED ORDER — HYDROCODONE-ACETAMINOPHEN 10-325 MG PO TABS: 1.0000 | ORAL_TABLET | Freq: Three times a day (TID) | ORAL | 0 refills | Status: DC | PRN

## 2017-06-25 MED ORDER — FENTANYL 25 MCG/HR TD PT72: 25.0000 ug | MEDICATED_PATCH | TRANSDERMAL | 0 refills | Status: DC

## 2017-06-25 MED ORDER — FENTANYL 100 MCG/HR TD PT72: 100.0000 ug | MEDICATED_PATCH | TRANSDERMAL | 0 refills | Status: DC

## 2017-06-25 MED FILL — XTANDI 40 MG CAPSULE: 40 | 30 days supply | Qty: 120 | Fill #1

## 2017-06-26 ENCOUNTER — Other Ambulatory Visit: Payer: Self-pay

## 2017-06-26 ENCOUNTER — Inpatient Hospital Stay (HOSPITAL_BASED_OUTPATIENT_CLINIC_OR_DEPARTMENT_OTHER): Payer: 59 | Admitting: Internal Medicine

## 2017-06-26 ENCOUNTER — Encounter: Payer: Self-pay | Admitting: Internal Medicine

## 2017-06-26 VITALS — BP 156/94 | Temp 97.9°F | Resp 20 | Ht 71.0 in | Wt 177.0 lb

## 2017-06-26 DIAGNOSIS — Z955 Presence of coronary angioplasty implant and graft: Secondary | ICD-10-CM | POA: Diagnosis not present

## 2017-06-26 DIAGNOSIS — C61 Malignant neoplasm of prostate: Secondary | ICD-10-CM | POA: Diagnosis not present

## 2017-06-26 DIAGNOSIS — I2510 Atherosclerotic heart disease of native coronary artery without angina pectoris: Secondary | ICD-10-CM | POA: Diagnosis not present

## 2017-06-26 DIAGNOSIS — R5383 Other fatigue: Secondary | ICD-10-CM | POA: Diagnosis not present

## 2017-06-26 DIAGNOSIS — C7951 Secondary malignant neoplasm of bone: Secondary | ICD-10-CM | POA: Diagnosis not present

## 2017-06-26 DIAGNOSIS — R110 Nausea: Secondary | ICD-10-CM

## 2017-06-26 DIAGNOSIS — Z79899 Other long term (current) drug therapy: Secondary | ICD-10-CM | POA: Diagnosis not present

## 2017-06-26 DIAGNOSIS — F17210 Nicotine dependence, cigarettes, uncomplicated: Secondary | ICD-10-CM

## 2017-06-26 DIAGNOSIS — G893 Neoplasm related pain (acute) (chronic): Secondary | ICD-10-CM | POA: Diagnosis not present

## 2017-06-26 MED ORDER — PROMETHAZINE HCL 12.5 MG PO TABS: 12.5000 mg | ORAL_TABLET | Freq: Three times a day (TID) | ORAL | 3 refills | Status: DC | PRN

## 2017-06-26 NOTE — Assessment & Plan Note (Addendum)
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  # currently on X-tandi; Lupron every 3 months [4/30]; clinically responding PSA stable.  #Bone mets continue Xgeva.  Stable.  #Malignancy related pain-stable.  Continue fentanyl patch hydrocodone.  # chronic nausea-stable continue Phenergan.  # fatigue-worsening.  Recommend exercise.   # follow up with 6 weeks/ X-geva; bmp/PSA; few days prior/Lupron.

## 2017-06-26 NOTE — Progress Notes (Signed)
[place] OFFICE PROGRESS NOTE  Patient Care Team: Olin Hauser, [name], [name], [name]  Cancer Staging No matching staging information was found for the patient.   Oncology History   # 2011- PROSTATE CANCER [Gleason 3+4]; s/p Prostatectomy [ also involved bladder neck/ECP; Dr.Polaseck]; July 2014- Biochemical recurrence [PSA 14]- started on Zoladex [Dr.Pandit]; lost to follow up.  # JAN 2017- STAGE IV METASTATIC PROSTATE Cancer to Bone- Feb 13th, 2017-  Lupron q 38m [~end of feb]; PSA: 1021; Declined Chemo; April 2017 [xofigo x6; Dr.Crystal]; AUG 2017- Zytiga + Prednisone BID. Bone scan-Jan 2018- improved skeletal metastases.   # MAY 1st 2019- START X-tandi [stopped zytiga/PSA- 8.8/rising]  # Mets to bone- start X-geva q 57M [May 30th]  # Smoker/ chronic pain/pain clinic   #[April 2018; [place]]- s/p stenting; on Plavix; --------------------------------------------------------------  DIAGNOSIS: [ ]  Castrate resistant prostate cancer  STAGE:   4    ;GOALS: Palliative  CURRENT/MOST RECENT THERAPY [April 2019]-Xtandi      Prostate cancer metastatic to bone ([place])   12/08/2014 Initial Diagnosis    Prostate cancer metastatic to bone [facility])         INTERVAL HISTORY:  Robert Short 67 y.o.  male pleasant patient above history of metastatic castrate resistant prostate cancer on [facility] is here for follow-up.  Patient continues to have chronic joint pain/back pain.  This is not been getting any worse.  Appetite fair.  Review of Systems  Constitutional: Positive for malaise/fatigue. Negative for chills, diaphoresis, fever and weight loss.  HENT: Negative for nosebleeds and sore throat.   Eyes: Negative for double vision.  Respiratory: Positive for shortness of breath. Negative for cough, hemoptysis, sputum  production and wheezing.   Cardiovascular: Negative for chest pain, palpitations, orthopnea and leg swelling.  Gastrointestinal: Negative for abdominal pain, blood in stool, constipation, diarrhea, heartburn, melena, nausea and vomiting.  Genitourinary: Negative for dysuria, frequency and urgency.  Musculoskeletal: Positive for back pain and joint pain.  Skin: Negative.  Negative for itching and rash.  Neurological: Negative for dizziness, tingling, focal weakness, weakness and headaches.  Endo/Heme/Allergies: Does not bruise/bleed easily.  Psychiatric/Behavioral: Negative for depression. The patient is not nervous/anxious and does not have insomnia.       PAST MEDICAL HISTORY :  Past Medical History:  Diagnosis Date  . Back pain 10/09/2012  . Bone cancer ([place])   . CAD (coronary artery disease)    a. 08/2015 PCI: LM nl, LAD 20d, LCX nl, RCA 58m, RPAV 95 (3.0x18 Xience Alpine DES);  b. 04/2016 PCI [facility]): RPL 95 (2.75x8 Promus Premier DES).  . Cancer associated pain   . Depression   . History of echocardiogram    a. 08/2016 Echo: EF 50-55%.  Marland Kitchen History of kidney stones   . Hyperlipidemia   . Hypertension   . Joint pain   . Prostate cancer [facility])    a.  s/p prostatectomy ([facility]);  b. bone mets noted 04/2016.  . Right arm pain 01/10/2016  . Right foot pain 01/10/2016  . Right leg pain 01/10/2016  . Tobacco abuse     PAST SURGICAL HISTORY :   Past Surgical History:  Procedure Laterality Date  . CARDIAC CATHETERIZATION     [facility]  . CARDIAC CATHETERIZATION N/A 08/16/2015   Procedure: Left Heart Cath and Coronary Angiography;  Surgeon: Yolonda Kida, MD;  Location:  [place] CV LAB;  Service: Cardiovascular;  Laterality: N/A;  . CARDIAC CATHETERIZATION N/A 08/16/2015   Procedure: Coronary Stent Intervention;  Surgeon: Yolonda Kida, MD;  Location: [place] CV LAB;  Service: Cardiovascular;  Laterality: N/A;  . CERVIX SURGERY    . PROSTATECTOMY    . SPINE SURGERY    .  TONSILLECTOMY    . WRIST SURGERY      FAMILY HISTORY :   Family History  Problem Relation Age of Onset  . Heart attack Mother   . Hypertension Mother   . Heart attack Father     SOCIAL HISTORY:   Social History   Tobacco Use  . Smoking status: Current Every Day Smoker    Packs/day: 1.00    Years: 40.00    Pack years: 40.00    Types: Cigarettes  . Smokeless tobacco: Current User  Substance Use Topics  . Alcohol use: No    Comment: occasionally  . Drug use: No    ALLERGIES:  is allergic to ditropan [oxybutynin].  MEDICATIONS:  Current Outpatient Medications  Medication Sig Dispense Refill  . acetaminophen (TYLENOL) 500 MG tablet Take 1,000 mg by mouth every 8 (eight) hours as needed.     Marland Kitchen albuterol (PROVENTIL HFA;VENTOLIN HFA) 108 (90 Base) MCG/ACT inhaler Inhale 1-2 puffs into the lungs every 6 (six) hours as needed for wheezing or shortness of breath. 1 Inhaler 0  . aspirin 81 MG tablet Take 81 mg by mouth daily.    Marland Kitchen atorvastatin (LIPITOR) 80 MG tablet Take 1 tablet (80 mg total) by mouth daily at 6 PM. 90 tablet 3  . clopidogrel (PLAVIX) 75 MG tablet TAKE 1 TABLET (75 MG TOTAL) BY MOUTH DAILY. 90 tablet 2  . fentaNYL (DURAGESIC - DOSED MCG/HR) 100 MCG/HR Place 1 patch (100 mcg total) onto the skin every 3 (three) days. Along with 63mcg patch every 3 days. 10 patch 0  . fentaNYL (DURAGESIC - DOSED MCG/HR) 25 MCG/HR patch Place 1 patch (25 mcg total) onto the skin every 3 (three) days. Use this along with fentanyl patch 100 mcg [total 125 mcg] every 3 days 10 patch 0  . HYDROcodone-acetaminophen (NORCO) 10-325 MG tablet Take 1 tablet by mouth every 8 (eight) hours as needed. 90 tablet 0  . metoprolol tartrate (LOPRESSOR) 25 MG tablet Take 0.5 tablets (12.5 mg total) by mouth 2 (two) times daily. 30 tablet 3  . nitroGLYCERIN (NITROSTAT) 0.4 MG SL tablet Place 0.4 mg under the tongue every 5 (five) minutes as needed.     . polyethylene glycol (MIRALAX / GLYCOLAX) packet Take  17 g by mouth daily as needed for moderate constipation.    . promethazine (PHENERGAN) 12.5 MG tablet Take 1-2 tablets (12.5-25 mg total) by mouth every 8 (eight) hours as needed for nausea or vomiting. 60 tablet 3  . Wheat Dextrin (BENEFIBER) POWD Stir 2 tsp. TID into 4-8 oz of any non-carbonated beverage or soft food (hot or cold) 500 g PRN  . XTANDI 40 MG capsule TAKE 4 CAPSULES (160 MG TOTAL) BY MOUTH DAILY. 120 capsule 3   No current facility-administered medications for this visit.     PHYSICAL EXAMINATION: ECOG PERFORMANCE STATUS: 1 - Symptomatic but completely ambulatory  BP (!) 156/94   Temp 97.9 F (36.6 C) (Tympanic)   Resp 20   Ht 5\' 11"  (1.803 m)   Wt 177 lb (80.3 kg)   BMI 24.69 kg/m   Filed Weights   06/26/17 1452  Weight:  177 lb (80.3 kg)    GENERAL: Well-nourished well-developed; Alert, no distress and comfortable.  Accompanied by his wife. EYES: no pallor or icterus OROPHARYNX: no thrush or ulceration; NECK: supple; no lymph nodes felt. LYMPH:  no palpable lymphadenopathy in the axillary or inguinal regions LUNGS: Decreased breath sounds auscultation bilaterally. No wheeze or crackles HEART/CVS: regular rate & rhythm and no murmurs; No lower extremity edema ABDOMEN:abdomen soft, non-tender and normal bowel sounds. No hepatomegaly or splenomegaly.  Musculoskeletal:no cyanosis of digits and no clubbing  PSYCH: alert & oriented x 3 with fluent speech NEURO: no focal motor/sensory deficits SKIN:  no rashes or significant lesions    LABORATORY DATA:  I have reviewed the data as listed    Component Value Date/Time   NA 135 06/22/2017 1431   NA 140 07/13/2013 1542   K 4.3 06/22/2017 1431   K 4.3 07/13/2013 1542   CL 102 06/22/2017 1431   CL 107 07/13/2013 1542   CO2 23 06/22/2017 1431   CO2 26 07/13/2013 1542   GLUCOSE 161 (H) 06/22/2017 1431   GLUCOSE 106 (H) 07/13/2013 1542   BUN 12 06/22/2017 1431   BUN 11 07/13/2013 1542   CREATININE 0.89  06/22/2017 1431   CREATININE 1.01 07/13/2013 1542   CALCIUM 9.1 06/22/2017 1431   CALCIUM 9.9 07/13/2013 1542   PROT 6.7 06/22/2017 1431   PROT 8.1 07/13/2013 1542   ALBUMIN 4.0 06/22/2017 1431   ALBUMIN 4.3 07/13/2013 1542   AST 25 06/22/2017 1431   AST 26 07/13/2013 1542   ALT 17 06/22/2017 1431   ALT 32 07/13/2013 1542   ALKPHOS 39 06/22/2017 1431   ALKPHOS 74 07/13/2013 1542   BILITOT 0.5 06/22/2017 1431   BILITOT 0.4 07/13/2013 1542   GFRNONAA >60 06/22/2017 1431   GFRNONAA >60 07/13/2013 1542   GFRAA >60 06/22/2017 1431   GFRAA >60 07/13/2013 1542    No results found for: SPEP, UPEP  Lab Results  Component Value Date   WBC 6.2 06/22/2017   NEUTROABS 4.5 06/22/2017   HGB 13.4 06/22/2017   HCT 39.8 (L) 06/22/2017   MCV 95.7 06/22/2017   PLT 181 06/22/2017      Chemistry      Component Value Date/Time   NA 135 06/22/2017 1431   NA 140 07/13/2013 1542   K 4.3 06/22/2017 1431   K 4.3 07/13/2013 1542   CL 102 06/22/2017 1431   CL 107 07/13/2013 1542   CO2 23 06/22/2017 1431   CO2 26 07/13/2013 1542   BUN 12 06/22/2017 1431   BUN 11 07/13/2013 1542   CREATININE 0.89 06/22/2017 1431   CREATININE 1.01 07/13/2013 1542      Component Value Date/Time   CALCIUM 9.1 06/22/2017 1431   CALCIUM 9.9 07/13/2013 1542   ALKPHOS 39 06/22/2017 1431   ALKPHOS 74 07/13/2013 1542   AST 25 06/22/2017 1431   AST 26 07/13/2013 1542   ALT 17 06/22/2017 1431   ALT 32 07/13/2013 1542   BILITOT 0.5 06/22/2017 1431   BILITOT 0.4 07/13/2013 1542     Results for Dobbs, Veldon L (MRN [id number]) as of 06/26/2017 15:01  Ref. Range 07/15/2012 13:47 08/15/2012 10:21 12/05/2012 10:07 02/08/2015 09:52 04/27/2015 11:49 06/01/2015 11:35 07/14/2015 13:25 08/10/2015 14:03 10/05/2015 13:44 11/18/2015 13:35 01/04/2016 13:25 02/03/2016 13:51 03/06/2016 14:53 04/14/2016 10:40 05/26/2016 13:49 07/06/2016 15:20 08/17/2016 11:03 09/28/2016 13:50 11/09/2016 13:28 01/08/2017 15:16 02/16/2017 14:03 03/30/2017 14:19 04/30/2017 13:41  05/24/2017 13:09 06/22/2017 14:31  PSA Latest Ref Range: 0.00 -  4.00 ng/mL 14.2 (H) 14.6 (H) 0.2 1,021.00 (H) 33.39 (H) 43.62 (H) 14.08 (H) 15.26 (H) 1.40 3.03 6.79 (H) 10.47 (H) 4.97 (H) 14.99 (H) 3.38            Prostatic Specific Antigen Latest Ref Range: 0.00 - 4.00 ng/mL                3.33 3.15 2.99 2.56 5.87 (H) 5.35 (H) 7.47 (H) 8.50 (H) 8.27 (H) 8.56 (H)    RADIOGRAPHIC STUDIES: I have personally reviewed the radiological images as listed and agreed with the findings in the report. No results found.   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  # currently on X-tandi; Lupron every 3 months [4/30]; clinically responding PSA stable.  #Bone mets continue Xgeva.  Stable.  #Malignancy related pain-stable.  Continue fentanyl patch hydrocodone.  # chronic nausea-stable continue Phenergan.  # fatigue-worsening.  Recommend exercise.   # follow up with 6 weeks/ X-geva; bmp/PSA; few days prior/Lupron.    Orders Placed This Encounter  Procedures  . CBC with Differential    Standing Status:   Future    Standing Expiration Date:   06/27/2018  . Comprehensive metabolic panel    Standing Status:   Future    Standing Expiration Date:   06/27/2018  . PSA    Standing Status:   Future    Standing Expiration Date:   06/27/2018   All questions were answered. The patient knows to call the clinic with any problems, questions or concerns.      [name], MD 07/01/2017 2:44 PM

## 2017-07-02 ENCOUNTER — Telehealth: Payer: Self-pay | Admitting: Pharmacist

## 2017-07-02 ENCOUNTER — Encounter: Payer: Self-pay | Admitting: Pharmacist

## 2017-07-02 ENCOUNTER — Other Ambulatory Visit: Payer: Self-pay | Admitting: Cardiovascular Disease

## 2017-07-02 NOTE — Telephone Encounter (Signed)
Oral Chemotherapy Pharmacist Encounter  Follow-Up Form  Called patient today to follow up regarding patient's oral chemotherapy medication: Xtandi (enzalutamide)  Original Start date of oral chemotherapy: 05/2017  Pt reports 0 tablets/doses of Xtandi missed in the last month.   Pt reports the following side effects: fatigue, he has repoted this to Dr. Rogue Bussing. I instructed him to keep up to date on this side effect.   New medications?: None reported  Other Issues: None reported  Patient knows to call the office with questions or concerns. Oral Oncology Clinic will continue to follow.  Darl Pikes, PharmD, BCPS, [facility] Hematology/Oncology Clinical Pharmacist [facility] [place] [facility] [phone]  07/02/2017 10:37 AM

## 2017-07-20 MED FILL — XTANDI 40 MG CAPSULE: 40 | 30 days supply | Qty: 120 | Fill #2

## 2017-07-23 ENCOUNTER — Other Ambulatory Visit: Payer: Self-pay | Admitting: Internal Medicine

## 2017-07-23 DIAGNOSIS — C61 Malignant neoplasm of prostate: Secondary | ICD-10-CM

## 2017-07-23 DIAGNOSIS — G893 Neoplasm related pain (acute) (chronic): Secondary | ICD-10-CM

## 2017-07-23 DIAGNOSIS — C7951 Secondary malignant neoplasm of bone: Principal | ICD-10-CM

## 2017-07-23 MED ORDER — FENTANYL 25 MCG/HR TD PT72: 25.0000 ug | MEDICATED_PATCH | TRANSDERMAL | 0 refills | Status: DC

## 2017-07-23 MED ORDER — HYDROCODONE-ACETAMINOPHEN 10-325 MG PO TABS: 1.0000 | ORAL_TABLET | Freq: Three times a day (TID) | ORAL | 0 refills | Status: DC | PRN

## 2017-07-23 MED ORDER — FENTANYL 100 MCG/HR TD PT72: 100.0000 ug | MEDICATED_PATCH | TRANSDERMAL | 0 refills | Status: DC

## 2017-08-02 ENCOUNTER — Other Ambulatory Visit: Payer: 59

## 2017-08-07 ENCOUNTER — Ambulatory Visit: Payer: 59 | Admitting: Internal Medicine

## 2017-08-07 ENCOUNTER — Ambulatory Visit: Payer: 59

## 2017-08-07 DIAGNOSIS — H5203 Hypermetropia, bilateral: Secondary | ICD-10-CM | POA: Diagnosis not present

## 2017-08-07 DIAGNOSIS — H52223 Regular astigmatism, bilateral: Secondary | ICD-10-CM | POA: Diagnosis not present

## 2017-08-07 DIAGNOSIS — H2513 Age-related nuclear cataract, bilateral: Secondary | ICD-10-CM | POA: Diagnosis not present

## 2017-08-07 DIAGNOSIS — H524 Presbyopia: Secondary | ICD-10-CM | POA: Diagnosis not present

## 2017-08-10 ENCOUNTER — Inpatient Hospital Stay: Payer: 59 | Attending: Internal Medicine

## 2017-08-10 DIAGNOSIS — C7951 Secondary malignant neoplasm of bone: Secondary | ICD-10-CM | POA: Diagnosis not present

## 2017-08-10 DIAGNOSIS — R110 Nausea: Secondary | ICD-10-CM | POA: Insufficient documentation

## 2017-08-10 DIAGNOSIS — I10 Essential (primary) hypertension: Secondary | ICD-10-CM | POA: Diagnosis not present

## 2017-08-10 DIAGNOSIS — Z7982 Long term (current) use of aspirin: Secondary | ICD-10-CM | POA: Insufficient documentation

## 2017-08-10 DIAGNOSIS — C61 Malignant neoplasm of prostate: Secondary | ICD-10-CM | POA: Diagnosis not present

## 2017-08-10 DIAGNOSIS — R5383 Other fatigue: Secondary | ICD-10-CM | POA: Insufficient documentation

## 2017-08-10 DIAGNOSIS — Z87442 Personal history of urinary calculi: Secondary | ICD-10-CM | POA: Insufficient documentation

## 2017-08-10 DIAGNOSIS — Z9079 Acquired absence of other genital organ(s): Secondary | ICD-10-CM | POA: Insufficient documentation

## 2017-08-10 DIAGNOSIS — I2510 Atherosclerotic heart disease of native coronary artery without angina pectoris: Secondary | ICD-10-CM | POA: Diagnosis not present

## 2017-08-10 DIAGNOSIS — Z79899 Other long term (current) drug therapy: Secondary | ICD-10-CM | POA: Insufficient documentation

## 2017-08-10 DIAGNOSIS — F17210 Nicotine dependence, cigarettes, uncomplicated: Secondary | ICD-10-CM | POA: Insufficient documentation

## 2017-08-10 DIAGNOSIS — G893 Neoplasm related pain (acute) (chronic): Secondary | ICD-10-CM | POA: Insufficient documentation

## 2017-08-10 DIAGNOSIS — E785 Hyperlipidemia, unspecified: Secondary | ICD-10-CM | POA: Diagnosis not present

## 2017-08-10 DIAGNOSIS — Z955 Presence of coronary angioplasty implant and graft: Secondary | ICD-10-CM | POA: Diagnosis not present

## 2017-08-10 LAB — CBC WITH DIFFERENTIAL/PLATELET
Basophils Absolute: 0 10*3/uL (ref 0–0.1)
Basophils Relative: 1 %
EOS PCT: 3 %
Eosinophils Absolute: 0.2 10*3/uL (ref 0–0.7)
HCT: 39.3 % — ABNORMAL LOW (ref 40.0–52.0)
Hemoglobin: 13.1 g/dL (ref 13.0–18.0)
Lymphocytes Relative: 24 %
Lymphs Abs: 1.1 10*3/uL (ref 1.0–3.6)
MCH: 31.9 pg (ref 26.0–34.0)
MCHC: 33.3 g/dL (ref 32.0–36.0)
MCV: 95.8 fL (ref 80.0–100.0)
MONO ABS: 0.5 10*3/uL (ref 0.2–1.0)
MONOS PCT: 10 %
Neutro Abs: 2.9 10*3/uL (ref 1.4–6.5)
Neutrophils Relative %: 62 %
PLATELETS: 198 10*3/uL (ref 150–440)
RBC: 4.1 MIL/uL — ABNORMAL LOW (ref 4.40–5.90)
RDW: 13.9 % (ref 11.5–14.5)
WBC: 4.7 10*3/uL (ref 3.8–10.6)

## 2017-08-10 LAB — COMPREHENSIVE METABOLIC PANEL
ALBUMIN: 4.1 g/dL (ref 3.5–5.0)
ALK PHOS: 37 U/L — AB (ref 38–126)
ALT: 14 U/L (ref 0–44)
AST: 20 U/L (ref 15–41)
Anion gap: 12 (ref 5–15)
BILIRUBIN TOTAL: 0.3 mg/dL (ref 0.3–1.2)
BUN: 16 mg/dL (ref 8–23)
CALCIUM: 9.6 mg/dL (ref 8.9–10.3)
CO2: 26 mmol/L (ref 22–32)
Chloride: 102 mmol/L (ref 98–111)
Creatinine, Ser: 0.88 mg/dL (ref 0.61–1.24)
GFR calc Af Amer: >60 mL/min (ref >60)
GFR calc non Af Amer: >60 mL/min (ref >60)
GLUCOSE: 140 mg/dL — AB (ref 70–99)
Potassium: 4 mmol/L (ref 3.5–5.1)
Sodium: 140 mmol/L (ref 135–145)
Total Protein: 6.7 g/dL (ref 6.5–8.1)

## 2017-08-11 LAB — PSA: Prostatic Specific Antigen: 7.31 ng/mL — ABNORMAL HIGH (ref 0.00–4.00)

## 2017-08-14 ENCOUNTER — Inpatient Hospital Stay (HOSPITAL_BASED_OUTPATIENT_CLINIC_OR_DEPARTMENT_OTHER): Payer: 59 | Admitting: Internal Medicine

## 2017-08-14 ENCOUNTER — Inpatient Hospital Stay: Payer: 59

## 2017-08-14 VITALS — BP 151/90 | HR 79 | Temp 97.1°F | Resp 16 | Wt 173.1 lb

## 2017-08-14 DIAGNOSIS — I10 Essential (primary) hypertension: Secondary | ICD-10-CM | POA: Diagnosis not present

## 2017-08-14 DIAGNOSIS — R110 Nausea: Secondary | ICD-10-CM | POA: Diagnosis not present

## 2017-08-14 DIAGNOSIS — G893 Neoplasm related pain (acute) (chronic): Secondary | ICD-10-CM | POA: Diagnosis not present

## 2017-08-14 DIAGNOSIS — F17210 Nicotine dependence, cigarettes, uncomplicated: Secondary | ICD-10-CM | POA: Diagnosis not present

## 2017-08-14 DIAGNOSIS — R5383 Other fatigue: Secondary | ICD-10-CM

## 2017-08-14 DIAGNOSIS — E785 Hyperlipidemia, unspecified: Secondary | ICD-10-CM | POA: Diagnosis not present

## 2017-08-14 DIAGNOSIS — Z79899 Other long term (current) drug therapy: Secondary | ICD-10-CM | POA: Diagnosis not present

## 2017-08-14 DIAGNOSIS — C61 Malignant neoplasm of prostate: Secondary | ICD-10-CM | POA: Diagnosis not present

## 2017-08-14 DIAGNOSIS — C7951 Secondary malignant neoplasm of bone: Secondary | ICD-10-CM | POA: Diagnosis not present

## 2017-08-14 MED ORDER — DENOSUMAB 120 MG/1.7ML ~~LOC~~ SOLN
120.0000 mg | Freq: Once | SUBCUTANEOUS | Status: AC
  Administered 2017-08-14: 120 mg via SUBCUTANEOUS
  Filled 2017-08-14: qty 1.7

## 2017-08-14 MED ORDER — LEUPROLIDE ACETATE (3 MONTH) 22.5 MG IM KIT
22.5000 mg | PACK | Freq: Once | INTRAMUSCULAR | Status: AC
  Administered 2017-08-14: 22.5 mg via INTRAMUSCULAR
  Filled 2017-08-14: qty 22.5

## 2017-08-14 NOTE — Progress Notes (Signed)
[place] OFFICE PROGRESS NOTE  Robert Short Care Team: Olin Hauser, [name], [name], [name]  Cancer Staging No matching staging information was found for the Robert Short.   Oncology History   # 2011- PROSTATE CANCER [Gleason 3+4]; s/p Prostatectomy [ also involved bladder neck/ECP; Dr.Polaseck]; July 2014- Biochemical recurrence [PSA 14]- started on Zoladex [Dr.Pandit]; lost to follow up.  # JAN 2017- STAGE IV METASTATIC PROSTATE Cancer to Bone- Feb 13th, 2017-  Lupron q 25m [~end of feb]; PSA: 1021; Declined Chemo; April 2017 [xofigo x6; Dr.Crystal]; AUG 2017- Zytiga + Prednisone BID. Bone scan-Jan 2018- improved skeletal metastases.   # MAY 1st 2019- START X-tandi [stopped zytiga/PSA- 8.8/rising]  # Mets to bone- start X-geva q 79M [May 30th]  # Smoker/ chronic pain/pain clinic   #[April 2018; [place]]- s/p stenting; on Plavix; --------------------------------------------------------------  DIAGNOSIS: [ ]  Castrate resistant prostate cancer  STAGE:   4    ;GOALS: Palliative  CURRENT/MOST RECENT THERAPY [April 2019]-Xtandi      Prostate cancer metastatic to bone ([place])   12/08/2014 Initial Diagnosis    Prostate cancer metastatic to bone [facility])       INTERVAL HISTORY:  Robert Short 67 y.o.  male pleasant Robert Short above history of metastatic castrate resistant prostate cancer currently on Xtandi is here for follow-up.  Robert Short appears to chronic mild to moderate fatigue.  Poor appetite.  Complains of shortness of breath and exertion.  Chronic back pain joint pains.  He has been taking narcotic pain prescriptions/fentanyl.  Is not getting worse.  Stable.  Review of Systems  Constitutional: Positive for malaise/fatigue and weight loss. Negative for chills, diaphoresis and fever.  HENT: Negative for nosebleeds  and sore throat.   Eyes: Negative for double vision.  Respiratory: Positive for shortness of breath. Negative for cough, hemoptysis, sputum production and wheezing.   Cardiovascular: Negative for chest pain, palpitations, orthopnea and leg swelling.  Gastrointestinal: Positive for constipation. Negative for abdominal pain, blood in stool, diarrhea, heartburn, melena, nausea and vomiting.  Genitourinary: Negative for dysuria, frequency and urgency.  Musculoskeletal: Positive for back pain and joint pain.  Skin: Negative.  Negative for itching and rash.  Neurological: Negative for dizziness, tingling, focal weakness, weakness and headaches.  Endo/Heme/Allergies: Does not bruise/bleed easily.  Psychiatric/Behavioral: Negative for depression. The Robert Short is not nervous/anxious and does not have insomnia.       PAST MEDICAL HISTORY :  Past Medical History:  Diagnosis Date  . Back pain 10/09/2012  . Bone cancer ([place])   . CAD (coronary artery disease)    a. 08/2015 PCI: LM nl, LAD 20d, LCX nl, RCA 24m, RPAV 95 (3.0x18 Xience Alpine DES);  b. 04/2016 PCI [facility]): RPL 95 (2.75x8 Promus Premier DES).  . Cancer associated pain   . Depression   . History of echocardiogram    a. 08/2016 Echo: EF 50-55%.  Marland Kitchen History of kidney stones   . Hyperlipidemia   . Hypertension   . Joint pain   . Prostate cancer [facility])    a.  s/p prostatectomy ([facility]);  b. bone mets noted 04/2016.  . Right arm pain 01/10/2016  . Right foot pain 01/10/2016  . Right leg pain 01/10/2016  . Tobacco abuse     PAST SURGICAL HISTORY :   Past Surgical History:  Procedure Laterality Date  . CARD[facility]AC CATHETERIZATION     [facility]  .  CARDIAC CATHETERIZATION N/A 08/16/2015   Procedure: Left Heart Cath and Coronary Angiography;  Surgeon: Yolonda Kida, MD;  Location: [place] CV LAB;  Service: Cardiovascular;  Laterality: N/A;  . CARDIAC CATHETERIZATION N/A 08/16/2015   Procedure: Coronary Stent Intervention;  Surgeon: Yolonda Kida, MD;  Location: [place] CV LAB;  Service: Cardiovascular;  Laterality: N/A;  . CERVIX SURGERY    . PROSTATECTOMY    . SPINE SURGERY    . TONSILLECTOMY    . WRIST SURGERY      FAMILY HISTORY :   Family History  Problem Relation Age of Onset  . Heart attack Mother   . Hypertension Mother   . Heart attack Father     SOCIAL HISTORY:   Social History   Tobacco Use  . Smoking status: Current Every Day Smoker    Packs/day: 1.00    Years: 40.00    Pack years: 40.00    Types: Cigarettes  . Smokeless tobacco: Current User  Substance Use Topics  . Alcohol use: No    Comment: occasionally  . Drug use: No    ALLERGIES:  is allergic to ditropan [oxybutynin].  MEDICATIONS:  Current Outpatient Medications  Medication Sig Dispense Refill  . acetaminophen (TYLENOL) 500 MG tablet Take 1,000 mg by mouth every 8 (eight) hours as needed.     Marland Kitchen albuterol (PROVENTIL HFA;VENTOLIN HFA) 108 (90 Base) MCG/ACT inhaler Inhale 1-2 puffs into the lungs every 6 (six) hours as needed for wheezing or shortness of breath. 1 Inhaler 0  . aspirin 81 MG tablet Take 81 mg by mouth daily.    Marland Kitchen atorvastatin (LIPITOR) 80 MG tablet TAKE 1 TABLET (80 MG TOTAL) BY MOUTH DAILY AT 6 PM. 90 tablet 1  . clopidogrel (PLAVIX) 75 MG tablet TAKE 1 TABLET (75 MG TOTAL) BY MOUTH DAILY. 90 tablet 2  . fentaNYL (DURAGESIC - DOSED MCG/HR) 100 MCG/HR Place 1 patch (100 mcg total) onto the skin every 3 (three) days. Along with 74mcg patch every 3 days. 10 patch 0  . fentaNYL (DURAGESIC - DOSED MCG/HR) 25 MCG/HR patch Place 1 patch (25 mcg total) onto the skin every 3 (three) days. Use this along with fentanyl patch 100 mcg [total 125 mcg] every 3 days 10 patch 0  . HYDROcodone-acetaminophen (NORCO) 10-325 MG tablet Take 1 tablet by mouth every 8 (eight) hours as needed. 90 tablet 0  . metoprolol tartrate (LOPRESSOR) 25 MG tablet Take 0.5 tablets (12.5 mg total) by mouth 2 (two) times daily. 30 tablet 3  .  nitroGLYCERIN (NITROSTAT) 0.4 MG SL tablet Place 0.4 mg under the tongue every 5 (five) minutes as needed.     . polyethylene glycol (MIRALAX / GLYCOLAX) packet Take 17 g by mouth daily as needed for moderate constipation.    . promethazine (PHENERGAN) 12.5 MG tablet Take 1-2 tablets (12.5-25 mg total) by mouth every 8 (eight) hours as needed for nausea or vomiting. 60 tablet 3  . Wheat Dextrin (BENEFIBER) POWD Stir 2 tsp. TID into 4-8 oz of any non-carbonated beverage or soft food (hot or cold) 500 g PRN  . XTANDI 40 MG capsule TAKE 4 CAPSULES (160 MG TOTAL) BY MOUTH DAILY. 120 capsule 3   No current facility-administered medications for this visit.     PHYSICAL EXAMINATION: ECOG PERFORMANCE STATUS: 1 - Symptomatic but completely ambulatory  BP (!) 151/90 (BP Location: Left Arm, Robert Short Position: Sitting)   Pulse 79   Temp (!) 97.1 F (36.2 C) (Tympanic)  Resp 16   Wt 173 lb 1 oz (78.5 kg)   BMI 24.14 kg/m   Filed Weights   08/14/17 1417  Weight: 173 lb 1 oz (78.5 kg)    GENERAL: Well-nourished well-developed; Alert, no distress and comfortable.  Alone.  Walks with a cane. EYES: no pallor or icterus OROPHARYNX: no thrush or ulceration; NECK: supple; no lymph nodes felt. LYMPH:  no palpable lymphadenopathy in the axillary or inguinal regions LUNGS: Decreased breath sounds auscultation bilaterally. No wheeze or crackles HEART/CVS: regular rate & rhythm and no murmurs; No lower extremity edema ABDOMEN:abdomen soft, non-tender and normal bowel sounds. No hepatomegaly or splenomegaly.  Musculoskeletal:no cyanosis of digits and no clubbing  PSYCH: alert & oriented x 3 with fluent speech NEURO: no focal motor/sensory deficits SKIN:  no rashes or significant lesions    LABORATORY DATA:  I have reviewed the data as listed    Component Value Date/Time   NA 140 08/10/2017 1419   NA 140 07/13/2013 1542   K 4.0 08/10/2017 1419   K 4.3 07/13/2013 1542   CL 102 08/10/2017 1419    CL 107 07/13/2013 1542   CO2 26 08/10/2017 1419   CO2 26 07/13/2013 1542   GLUCOSE 140 (H) 08/10/2017 1419   GLUCOSE 106 (H) 07/13/2013 1542   BUN 16 08/10/2017 1419   BUN 11 07/13/2013 1542   CREATININE 0.88 08/10/2017 1419   CREATININE 1.01 07/13/2013 1542   CALCIUM 9.6 08/10/2017 1419   CALCIUM 9.9 07/13/2013 1542   PROT 6.7 08/10/2017 1419   PROT 8.1 07/13/2013 1542   ALBUMIN 4.1 08/10/2017 1419   ALBUMIN 4.3 07/13/2013 1542   AST 20 08/10/2017 1419   AST 26 07/13/2013 1542   ALT 14 08/10/2017 1419   ALT 32 07/13/2013 1542   ALKPHOS 37 (L) 08/10/2017 1419   ALKPHOS 74 07/13/2013 1542   BILITOT 0.3 08/10/2017 1419   BILITOT 0.4 07/13/2013 1542   GFRNONAA >60 08/10/2017 1419   GFRNONAA >60 07/13/2013 1542   GFRAA >60 08/10/2017 1419   GFRAA >60 07/13/2013 1542    No results found for: SPEP, UPEP  Lab Results  Component Value Date   WBC 4.7 08/10/2017   NEUTROABS 2.9 08/10/2017   HGB 13.1 08/10/2017   HCT 39.3 (L) 08/10/2017   MCV 95.8 08/10/2017   PLT 198 08/10/2017      Chemistry      Component Value Date/Time   NA 140 08/10/2017 1419   NA 140 07/13/2013 1542   K 4.0 08/10/2017 1419   K 4.3 07/13/2013 1542   CL 102 08/10/2017 1419   CL 107 07/13/2013 1542   CO2 26 08/10/2017 1419   CO2 26 07/13/2013 1542   BUN 16 08/10/2017 1419   BUN 11 07/13/2013 1542   CREATININE 0.88 08/10/2017 1419   CREATININE 1.01 07/13/2013 1542      Component Value Date/Time   CALCIUM 9.6 08/10/2017 1419   CALCIUM 9.9 07/13/2013 1542   ALKPHOS 37 (L) 08/10/2017 1419   ALKPHOS 74 07/13/2013 1542   AST 20 08/10/2017 1419   AST 26 07/13/2013 1542   ALT 14 08/10/2017 1419   ALT 32 07/13/2013 1542   BILITOT 0.3 08/10/2017 1419   BILITOT 0.4 07/13/2013 1542       RADIOGRAPHIC STUDIES: I have personally reviewed the radiological images as listed and agreed with the findings in the report. No results found.   ASSESSMENT & PLAN:  Prostate cancer metastatic to bone  ([place]) Castrate resistant  prostate cancer metastases to bone.  # currently on X-tandi 34-4 /day.; Lupron every 3 months [8/13]; clinically responding PSA STABLE.  #Bone mets continue Xgeva.  STABLE.   #Malignancy related pain-STABLE Continue fentanyl patch hydrocodone.  # chronic nausea-stable continue Phenergan.  # fatigue-improved.   # follow up with 6 weeks/ X-geva; bmp/PSA/cbc-   Orders Placed This Encounter  Procedures  . CBC with Differential/Platelet    Standing Status:   Future    Standing Expiration Date:   08/15/2018  . Comprehensive metabolic panel    Standing Status:   Future    Standing Expiration Date:   08/15/2018  . PSA    Standing Status:   Future    Standing Expiration Date:   08/15/2018   All questions were answered. The Robert Short knows to call the clinic with any problems, questions or concerns.      [name], MD 08/21/2017 6:05 PM

## 2017-08-14 NOTE — Patient Instructions (Signed)
Leuprolide injection What is this medicine? LEUPROLIDE (loo PROE lide) is a man-made hormone. It is used to treat the symptoms of prostate cancer. This medicine may also be used to treat children with early onset of puberty. It may be used for other hormonal conditions. This medicine may be used for other purposes; ask your health care provider or pharmacist if you have questions. COMMON BRAND NAME(S): Lupron What should I tell my health care provider before I take this medicine? They need to know if you have any of these conditions: -diabetes -heart disease or previous heart attack -high blood pressure -high cholesterol -pain or difficulty passing urine -spinal cord metastasis -stroke -tobacco smoker -an unusual or allergic reaction to leuprolide, benzyl alcohol, other medicines, foods, dyes, or preservatives -pregnant or trying to get pregnant -breast-feeding How should I use this medicine? This medicine is for injection under the skin or into a muscle. You will be taught how to prepare and give this medicine. Use exactly as directed. Take your medicine at regular intervals. Do not take your medicine more often than directed. It is important that you put your used needles and syringes in a special sharps container. Do not put them in a trash can. If you do not have a sharps container, call your pharmacist or healthcare provider to get one. A special MedGuide will be given to you by the pharmacist with each prescription and refill. Be sure to read this information carefully each time. Talk to your pediatrician regarding the use of this medicine in children. While this medicine may be prescribed for children as young as 8 years for selected conditions, precautions do apply. Overdosage: If you think you have taken too much of this medicine contact a poison control center or emergency room at once. NOTE: This medicine is only for you. Do not share this medicine with others. What if I miss a  dose? If you miss a dose, take it as soon as you can. If it is almost time for your next dose, take only that dose. Do not take double or extra doses. What may interact with this medicine? Do not take this medicine with any of the following medications: -chasteberry This medicine may also interact with the following medications: -herbal or dietary supplements, like black cohosh or DHEA -male hormones, like estrogens or progestins and birth control pills, patches, rings, or injections -male hormones, like testosterone This list may not describe all possible interactions. Give your health care provider a list of all the medicines, herbs, non-prescription drugs, or dietary supplements you use. Also tell them if you smoke, drink alcohol, or use illegal drugs. Some items may interact with your medicine. What should I watch for while using this medicine? Visit your doctor or health care professional for regular checks on your progress. During the first week, your symptoms may get worse, but then will improve as you continue your treatment. You may get hot flashes, increased bone pain, increased difficulty passing urine, or an aggravation of nerve symptoms. Discuss these effects with your doctor or health care professional, some of them may improve with continued use of this medicine. Male patients may experience a menstrual cycle or spotting during the first 2 months of therapy with this medicine. If this continues, contact your doctor or health care professional. What side effects may I notice from receiving this medicine? Side effects that you should report to your doctor or health care professional as soon as possible: -allergic reactions like skin rash, itching or  hives, swelling of the face, lips, or tongue -breathing problems -chest pain -depression or memory disorders -pain in your legs or groin -pain at site where injected -severe headache -swelling of the feet and legs -visual  changes -vomiting Side effects that usually do not require medical attention (report to your doctor or health care professional if they continue or are bothersome): -breast swelling or tenderness -decrease in sex drive or performance -diarrhea -hot flashes -loss of appetite -muscle, joint, or bone pains -nausea -redness or irritation at site where injected -skin problems or acne This list may not describe all possible side effects. Call your doctor for medical advice about side effects. You may report side effects to FDA at 1-800-FDA-1088. Where should I keep my medicine? Keep out of the reach of children. Store below 25 degrees C (77 degrees F). Do not freeze. Protect from light. Do not use if it is not clear or if there are particles present. Throw away any unused medicine after the expiration date. NOTE: This sheet is a summary. It may not cover all possible information. If you have questions about this medicine, talk to your doctor, pharmacist, or health care provider.  2018 Elsevier/Gold Standard (2015-08-09 10:54:35) Denosumab injection What is this medicine? DENOSUMAB (den oh sue mab) slows bone breakdown. Prolia is used to treat osteoporosis in women after menopause and in men. Xgeva is used to treat a high calcium level due to cancer and to prevent bone fractures and other bone problems caused by multiple myeloma or cancer bone metastases. Xgeva is also used to treat giant cell tumor of the bone. This medicine may be used for other purposes; ask your health care provider or pharmacist if you have questions. COMMON BRAND NAME(S): Prolia, XGEVA What should I tell my health care provider before I take this medicine? They need to know if you have any of these conditions: -dental disease -having surgery or tooth extraction -infection -kidney disease -low levels of calcium or Vitamin D in the blood -malnutrition -on hemodialysis -skin conditions or sensitivity -thyroid or  parathyroid disease -an unusual reaction to denosumab, other medicines, foods, dyes, or preservatives -pregnant or trying to get pregnant -breast-feeding How should I use this medicine? This medicine is for injection under the skin. It is given by a health care professional in a hospital or clinic setting. If you are getting Prolia, a special MedGuide will be given to you by the pharmacist with each prescription and refill. Be sure to read this information carefully each time. For Prolia, talk to your pediatrician regarding the use of this medicine in children. Special care may be needed. For Xgeva, talk to your pediatrician regarding the use of this medicine in children. While this drug may be prescribed for children as young as 13 years for selected conditions, precautions do apply. Overdosage: If you think you have taken too much of this medicine contact a poison control center or emergency room at once. NOTE: This medicine is only for you. Do not share this medicine with others. What if I miss a dose? It is important not to miss your dose. Call your doctor or health care professional if you are unable to keep an appointment. What may interact with this medicine? Do not take this medicine with any of the following medications: -other medicines containing denosumab This medicine may also interact with the following medications: -medicines that lower your chance of fighting infection -steroid medicines like prednisone or cortisone This list may not describe all possible interactions.   Give your health care provider a list of all the medicines, herbs, non-prescription drugs, or dietary supplements you use. Also tell them if you smoke, drink alcohol, or use illegal drugs. Some items may interact with your medicine. What should I watch for while using this medicine? Visit your doctor or health care professional for regular checks on your progress. Your doctor or health care professional may order  blood tests and other tests to see how you are doing. Call your doctor or health care professional for advice if you get a fever, chills or sore throat, or other symptoms of a cold or flu. Do not treat yourself. This drug may decrease your body's ability to fight infection. Try to avoid being around people who are sick. You should make sure you get enough calcium and vitamin D while you are taking this medicine, unless your doctor tells you not to. Discuss the foods you eat and the vitamins you take with your health care professional. See your dentist regularly. Brush and floss your teeth as directed. Before you have any dental work done, tell your dentist you are receiving this medicine. Do not become pregnant while taking this medicine or for 5 months after stopping it. Talk with your doctor or health care professional about your birth control options while taking this medicine. Women should inform their doctor if they wish to become pregnant or think they might be pregnant. There is a potential for serious side effects to an unborn child. Talk to your health care professional or pharmacist for more information. What side effects may I notice from receiving this medicine? Side effects that you should report to your doctor or health care professional as soon as possible: -allergic reactions like skin rash, itching or hives, swelling of the face, lips, or tongue -bone pain -breathing problems -dizziness -jaw pain, especially after dental work -redness, blistering, peeling of the skin -signs and symptoms of infection like fever or chills; cough; sore throat; pain or trouble passing urine -signs of low calcium like fast heartbeat, muscle cramps or muscle pain; pain, tingling, numbness in the hands or feet; seizures -unusual bleeding or bruising -unusually weak or tired Side effects that usually do not require medical attention (report to your doctor or health care professional if they continue or are  bothersome): -constipation -diarrhea -headache -joint pain -loss of appetite -muscle pain -runny nose -tiredness -upset stomach This list may not describe all possible side effects. Call your doctor for medical advice about side effects. You may report side effects to FDA at 1-800-FDA-1088. Where should I keep my medicine? This medicine is only given in a clinic, doctor's office, or other health care setting and will not be stored at home. NOTE: This sheet is a summary. It may not cover all possible information. If you have questions about this medicine, talk to your doctor, pharmacist, or health care provider.  2018 Elsevier/Gold Standard (2016-01-11 19:17:21)

## 2017-08-14 NOTE — Assessment & Plan Note (Addendum)
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  # currently on X-tandi 34-4 /day.; Lupron every 3 months [8/13]; clinically responding PSA STABLE.  #Bone mets continue Xgeva.  STABLE.   #Malignancy related pain-STABLE Continue fentanyl patch hydrocodone.  # chronic nausea-stable continue Phenergan.  # follow up with 6 weeks/ X-geva; bmp/PSA/cbc.

## 2017-08-16 MED FILL — XTANDI 40 MG CAPSULE: 40 | 30 days supply | Qty: 120 | Fill #3

## 2017-08-20 ENCOUNTER — Other Ambulatory Visit: Payer: Self-pay | Admitting: Oncology

## 2017-08-20 DIAGNOSIS — C7951 Secondary malignant neoplasm of bone: Principal | ICD-10-CM

## 2017-08-20 DIAGNOSIS — G893 Neoplasm related pain (acute) (chronic): Secondary | ICD-10-CM

## 2017-08-20 DIAGNOSIS — C61 Malignant neoplasm of prostate: Secondary | ICD-10-CM

## 2017-08-20 NOTE — Telephone Encounter (Signed)
Dr. B patient  

## 2017-08-21 ENCOUNTER — Other Ambulatory Visit: Payer: Self-pay

## 2017-08-21 DIAGNOSIS — C7951 Secondary malignant neoplasm of bone: Principal | ICD-10-CM

## 2017-08-21 DIAGNOSIS — C61 Malignant neoplasm of prostate: Secondary | ICD-10-CM

## 2017-08-21 DIAGNOSIS — G893 Neoplasm related pain (acute) (chronic): Secondary | ICD-10-CM

## 2017-08-21 MED ORDER — FENTANYL 25 MCG/HR TD PT72: 25.0000 ug | MEDICATED_PATCH | TRANSDERMAL | 0 refills | Status: DC

## 2017-08-21 MED ORDER — FENTANYL 100 MCG/HR TD PT72: 100.0000 ug | MEDICATED_PATCH | TRANSDERMAL | 0 refills | Status: DC

## 2017-08-21 MED ORDER — HYDROCODONE-ACETAMINOPHEN 10-325 MG PO TABS: 1.0000 | ORAL_TABLET | Freq: Three times a day (TID) | ORAL | 0 refills | Status: DC | PRN

## 2017-08-21 NOTE — Telephone Encounter (Signed)
Patient is requesting refill on fentanyl patches and hydrocodone. Dr. Jacinto Reap, please advise.

## 2017-09-10 ENCOUNTER — Encounter: Payer: Self-pay | Admitting: Internal Medicine

## 2017-09-10 ENCOUNTER — Other Ambulatory Visit: Payer: Self-pay | Admitting: *Deleted

## 2017-09-10 ENCOUNTER — Other Ambulatory Visit: Payer: Self-pay | Admitting: Internal Medicine

## 2017-09-10 DIAGNOSIS — C7951 Secondary malignant neoplasm of bone: Principal | ICD-10-CM

## 2017-09-10 DIAGNOSIS — C61 Malignant neoplasm of prostate: Secondary | ICD-10-CM

## 2017-09-10 MED ORDER — PREDNISONE 20 MG PO TABS: 20.0000 mg | ORAL_TABLET | Freq: Every day | ORAL | 0 refills | Status: DC

## 2017-09-10 MED FILL — XTANDI 40 MG CAPSULE: 40 | 30 days supply | Qty: 120 | Fill #0

## 2017-09-19 ENCOUNTER — Other Ambulatory Visit: Payer: Self-pay | Admitting: Internal Medicine

## 2017-09-19 DIAGNOSIS — C61 Malignant neoplasm of prostate: Secondary | ICD-10-CM

## 2017-09-19 DIAGNOSIS — G893 Neoplasm related pain (acute) (chronic): Secondary | ICD-10-CM

## 2017-09-19 DIAGNOSIS — C7951 Secondary malignant neoplasm of bone: Principal | ICD-10-CM

## 2017-09-19 MED ORDER — FENTANYL 100 MCG/HR TD PT72: 100.0000 ug | MEDICATED_PATCH | TRANSDERMAL | 0 refills | Status: DC

## 2017-09-19 MED ORDER — FENTANYL 25 MCG/HR TD PT72: 25.0000 ug | MEDICATED_PATCH | TRANSDERMAL | 0 refills | Status: DC

## 2017-09-19 MED ORDER — HYDROCODONE-ACETAMINOPHEN 10-325 MG PO TABS: 1.0000 | ORAL_TABLET | Freq: Three times a day (TID) | ORAL | 0 refills | Status: DC | PRN

## 2017-09-20 ENCOUNTER — Telehealth: Payer: Self-pay | Admitting: Pharmacist

## 2017-09-20 NOTE — Telephone Encounter (Signed)
Oral Chemotherapy Pharmacist Encounter  Follow-Up Form  Called patient today to follow up regarding patient's oral chemotherapy medication: Scot Jun Start date of oral chemotherapy: 05/2017  Pt reports 0 tablets/doses of Xtandi missed in the last month.   Pt reports the following side effects: None reported  Recent labs reviewed: PSA from 08/10/17  New medications?: None reported  Other Issues: None reported  Patient knows to call the office with questions or concerns. Oral Oncology Clinic will continue to follow.  Darl Pikes, PharmD, BCPS, [facility] Hematology/Oncology Clinical Pharmacist [facility] [phone]  09/20/2017 3:31 PM

## 2017-09-24 ENCOUNTER — Inpatient Hospital Stay: Payer: 59 | Attending: Internal Medicine

## 2017-09-24 DIAGNOSIS — F329 Major depressive disorder, single episode, unspecified: Secondary | ICD-10-CM | POA: Diagnosis not present

## 2017-09-24 DIAGNOSIS — Z9079 Acquired absence of other genital organ(s): Secondary | ICD-10-CM | POA: Insufficient documentation

## 2017-09-24 DIAGNOSIS — Z87442 Personal history of urinary calculi: Secondary | ICD-10-CM | POA: Diagnosis not present

## 2017-09-24 DIAGNOSIS — I2510 Atherosclerotic heart disease of native coronary artery without angina pectoris: Secondary | ICD-10-CM | POA: Insufficient documentation

## 2017-09-24 DIAGNOSIS — C61 Malignant neoplasm of prostate: Secondary | ICD-10-CM | POA: Insufficient documentation

## 2017-09-24 DIAGNOSIS — Z79899 Other long term (current) drug therapy: Secondary | ICD-10-CM | POA: Diagnosis not present

## 2017-09-24 DIAGNOSIS — G893 Neoplasm related pain (acute) (chronic): Secondary | ICD-10-CM | POA: Insufficient documentation

## 2017-09-24 DIAGNOSIS — E785 Hyperlipidemia, unspecified: Secondary | ICD-10-CM | POA: Diagnosis not present

## 2017-09-24 DIAGNOSIS — I10 Essential (primary) hypertension: Secondary | ICD-10-CM | POA: Insufficient documentation

## 2017-09-24 DIAGNOSIS — F17210 Nicotine dependence, cigarettes, uncomplicated: Secondary | ICD-10-CM | POA: Diagnosis not present

## 2017-09-24 DIAGNOSIS — Z7982 Long term (current) use of aspirin: Secondary | ICD-10-CM | POA: Insufficient documentation

## 2017-09-24 DIAGNOSIS — C7951 Secondary malignant neoplasm of bone: Secondary | ICD-10-CM | POA: Diagnosis not present

## 2017-09-24 DIAGNOSIS — Z955 Presence of coronary angioplasty implant and graft: Secondary | ICD-10-CM | POA: Diagnosis not present

## 2017-09-24 LAB — COMPREHENSIVE METABOLIC PANEL
ALK PHOS: 37 U/L — AB (ref 38–126)
ALT: 15 U/L (ref 0–44)
AST: 20 U/L (ref 15–41)
Albumin: 4.3 g/dL (ref 3.5–5.0)
Anion gap: 9 (ref 5–15)
BILIRUBIN TOTAL: 0.5 mg/dL (ref 0.3–1.2)
BUN: 16 mg/dL (ref 8–23)
CHLORIDE: 106 mmol/L (ref 98–111)
CO2: 24 mmol/L (ref 22–32)
CREATININE: 0.87 mg/dL (ref 0.61–1.24)
Calcium: 9.6 mg/dL (ref 8.9–10.3)
Glucose, Bld: 121 mg/dL — ABNORMAL HIGH (ref 70–99)
Potassium: 4.4 mmol/L (ref 3.5–5.1)
Sodium: 139 mmol/L (ref 135–145)
TOTAL PROTEIN: 7 g/dL (ref 6.5–8.1)

## 2017-09-24 LAB — CBC WITH DIFFERENTIAL/PLATELET
BASOS PCT: 1 %
Basophils Absolute: 0 10*3/uL (ref 0–0.1)
EOS PCT: 0 %
Eosinophils Absolute: 0 10*3/uL (ref 0–0.7)
HCT: 37.4 % — ABNORMAL LOW (ref 40.0–52.0)
HEMOGLOBIN: 12.7 g/dL — AB (ref 13.0–18.0)
LYMPHS ABS: 0.7 10*3/uL — AB (ref 1.0–3.6)
Lymphocytes Relative: 10 %
MCH: 31.9 pg (ref 26.0–34.0)
MCHC: 33.9 g/dL (ref 32.0–36.0)
MCV: 94.2 fL (ref 80.0–100.0)
Monocytes Absolute: 0.4 10*3/uL (ref 0.2–1.0)
Monocytes Relative: 6 %
NEUTROS PCT: 83 %
Neutro Abs: 6.1 10*3/uL (ref 1.4–6.5)
PLATELETS: 180 10*3/uL (ref 150–440)
RBC: 3.97 MIL/uL — AB (ref 4.40–5.90)
RDW: 14.2 % (ref 11.5–14.5)
WBC: 7.4 10*3/uL (ref 3.8–10.6)

## 2017-09-24 LAB — PSA: PROSTATIC SPECIFIC ANTIGEN: 14.79 ng/mL — AB (ref 0.00–4.00)

## 2017-09-25 ENCOUNTER — Other Ambulatory Visit: Payer: 59

## 2017-09-25 ENCOUNTER — Inpatient Hospital Stay: Payer: 59

## 2017-09-25 ENCOUNTER — Encounter: Payer: Self-pay | Admitting: Internal Medicine

## 2017-09-25 ENCOUNTER — Inpatient Hospital Stay (HOSPITAL_BASED_OUTPATIENT_CLINIC_OR_DEPARTMENT_OTHER): Payer: 59 | Admitting: Internal Medicine

## 2017-09-25 VITALS — BP 161/79 | HR 64 | Temp 97.4°F | Resp 16 | Wt 175.6 lb

## 2017-09-25 DIAGNOSIS — I10 Essential (primary) hypertension: Secondary | ICD-10-CM | POA: Diagnosis not present

## 2017-09-25 DIAGNOSIS — Z79899 Other long term (current) drug therapy: Secondary | ICD-10-CM

## 2017-09-25 DIAGNOSIS — E785 Hyperlipidemia, unspecified: Secondary | ICD-10-CM | POA: Diagnosis not present

## 2017-09-25 DIAGNOSIS — C61 Malignant neoplasm of prostate: Secondary | ICD-10-CM | POA: Diagnosis not present

## 2017-09-25 DIAGNOSIS — I2510 Atherosclerotic heart disease of native coronary artery without angina pectoris: Secondary | ICD-10-CM | POA: Diagnosis not present

## 2017-09-25 DIAGNOSIS — F329 Major depressive disorder, single episode, unspecified: Secondary | ICD-10-CM | POA: Diagnosis not present

## 2017-09-25 DIAGNOSIS — F17210 Nicotine dependence, cigarettes, uncomplicated: Secondary | ICD-10-CM | POA: Diagnosis not present

## 2017-09-25 DIAGNOSIS — C7951 Secondary malignant neoplasm of bone: Secondary | ICD-10-CM | POA: Diagnosis not present

## 2017-09-25 DIAGNOSIS — G893 Neoplasm related pain (acute) (chronic): Secondary | ICD-10-CM | POA: Diagnosis not present

## 2017-09-25 MED ORDER — PREDNISONE 10 MG PO TABS: 10.0000 mg | ORAL_TABLET | Freq: Every day | ORAL | 3 refills | Status: DC

## 2017-09-25 MED ORDER — DENOSUMAB 120 MG/1.7ML ~~LOC~~ SOLN
120.0000 mg | Freq: Once | SUBCUTANEOUS | Status: AC
  Administered 2017-09-25: 120 mg via SUBCUTANEOUS

## 2017-09-25 NOTE — Assessment & Plan Note (Addendum)
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  PSA rising; but pt not compliant.  Recommend compliance.  If continues to rise then recommend repeat imaging.  #Bone mets continue Xgeva.  STABLE; cont every 6 w.    # Fatigue-worse.  Re-start prednisone 10mg  /day [patient preference.]  #Malignancy related pain-STABLE Continue fentanyl patch hydrocodone.  # follow up with 6 weeks/ X-geva; Lupron bmp/PSA/cbc.

## 2017-09-25 NOTE — Progress Notes (Signed)
[facility] OFFICE PROGRESS NOTE  Patient Care Team: Olin Hauser, [name], [name], [name]  Cancer Staging No matching staging information was found for the patient.   Oncology History   # 2011- PROSTATE CANCER [Gleason 3+4]; s/p Prostatectomy [ also involved bladder neck/ECP; Dr.Polaseck]; July 2014- Biochemical recurrence [PSA 14]- started on Zoladex [Dr.Pandit]; lost to follow up.  # JAN 2017- STAGE IV METASTATIC PROSTATE Cancer to Bone- Feb 13th, 2017-  Lupron q 81m [~end of feb]; PSA: 1021; Declined Chemo; April 2017 [xofigo x6; Dr.Crystal]; AUG 2017- Zytiga + Prednisone BID. Bone scan-Jan 2018- improved skeletal metastases.   # MAY 1st 2019- START X-tandi [stopped zytiga/PSA- 8.8/rising]  # Mets to bone- start X-geva q 75M [May 30th]  # Smoker/ chronic pain/pain clinic   #[April 2018; [place]]- s/p stenting; on Plavix; --------------------------------------------------------------  DIAGNOSIS: [ ]  Castrate resistant prostate cancer  STAGE:   4    ;GOALS: Palliative  CURRENT/MOST RECENT THERAPY [April 2019]-Xtandi      Prostate cancer metastatic to bone ([place])   12/08/2014 Initial Diagnosis    Prostate cancer metastatic to bone [facility])       INTERVAL HISTORY:  Robert Short Patin 67 y.o.  male pleasant patient above history of metastatic castrate resistant prostate cancer currently on Xtandi is here for follow-up.  Patient states his chronic pain is stable.  He continues to be on pain medications narcotics pain patches.  Chronic moderate to severe fatigue.  He admits to not taking his Xtandi.  Review of Systems  Constitutional: Positive for malaise/fatigue and weight loss. Negative for chills, diaphoresis and fever.  HENT: Negative for nosebleeds and sore throat.   Eyes: Negative for double vision.   Respiratory: Positive for shortness of breath. Negative for cough, hemoptysis, sputum production and wheezing.   Cardiovascular: Negative for chest pain, palpitations, orthopnea and leg swelling.  Gastrointestinal: Positive for constipation. Negative for abdominal pain, blood in stool, diarrhea, heartburn, melena, nausea and vomiting.  Genitourinary: Negative for dysuria, frequency and urgency.  Musculoskeletal: Positive for back pain and joint pain.  Skin: Negative.  Negative for itching and rash.  Neurological: Negative for dizziness, tingling, focal weakness, weakness and headaches.  Endo/Heme/Allergies: Does not bruise/bleed easily.  Psychiatric/Behavioral: Negative for depression. The patient is not nervous/anxious and does not have insomnia.       PAST MEDICAL HISTORY :  Past Medical History:  Diagnosis Date  . Back pain 10/09/2012  . Bone cancer ([place])   . CAD (coronary artery disease)    a. 08/2015 PCI: LM nl, LAD 20d, LCX nl, RCA 37m, RPAV 95 (3.0x18 Xience Alpine DES);  b. 04/2016 PCI [facility]): RPL 95 (2.75x8 Promus Premier DES).  . Cancer associated pain   . Depression   . History of echocardiogram    a. 08/2016 Echo: EF 50-55%.  Marland Kitchen History of kidney stones   . Hyperlipidemia   . Hypertension   . Joint pain   . Prostate cancer [facility])    a.  s/p prostatectomy ([facility]);  b. bone mets noted 04/2016.  . Right arm pain 01/10/2016  . Right foot pain 01/10/2016  . Right leg pain 01/10/2016  . Tobacco abuse     PAST SURGICAL HISTORY :   Past Surgical History:  Procedure Laterality Date  . CARDIAC CATHETERIZATION     [facility]  . CARDIAC CATHETERIZATION N/A 08/16/2015  Procedure: Left Heart Cath and Coronary Angiography;  Surgeon: Yolonda Kida, MD;  Location: [place] CV LAB;  Service: Cardiovascular;  Laterality: N/A;  . CARDIAC CATHETERIZATION N/A 08/16/2015   Procedure: Coronary Stent Intervention;  Surgeon: Yolonda Kida, MD;  Location: [place] CV LAB;   Service: Cardiovascular;  Laterality: N/A;  . CERVIX SURGERY    . PROSTATECTOMY    . SPINE SURGERY    . TONSILLECTOMY    . WRIST SURGERY      FAMILY HISTORY :   Family History  Problem Relation Age of Onset  . Heart attack Mother   . Hypertension Mother   . Heart attack Father     SOCIAL HISTORY:   Social History   Tobacco Use  . Smoking status: Current Every Day Smoker    Packs/day: 1.00    Years: 40.00    Pack years: 40.00    Types: Cigarettes  . Smokeless tobacco: Current User  Substance Use Topics  . Alcohol use: No    Comment: occasionally  . Drug use: No    ALLERGIES:  is allergic to ditropan [oxybutynin].  MEDICATIONS:  Current Outpatient Medications  Medication Sig Dispense Refill  . acetaminophen (TYLENOL) 500 MG tablet Take 1,000 mg by mouth every 8 (eight) hours as needed.     Marland Kitchen albuterol (PROVENTIL HFA;VENTOLIN HFA) 108 (90 Base) MCG/ACT inhaler Inhale 1-2 puffs into the lungs every 6 (six) hours as needed for wheezing or shortness of breath. 1 Inhaler 0  . aspirin 81 MG tablet Take 81 mg by mouth daily.    Marland Kitchen atorvastatin (LIPITOR) 80 MG tablet TAKE 1 TABLET (80 MG TOTAL) BY MOUTH DAILY AT 6 PM. 90 tablet 1  . clopidogrel (PLAVIX) 75 MG tablet TAKE 1 TABLET (75 MG TOTAL) BY MOUTH DAILY. 90 tablet 2  . fentaNYL (DURAGESIC - DOSED MCG/HR) 100 MCG/HR Place 1 patch (100 mcg total) onto the skin every 3 (three) days. Along with 62mcg patch every 3 days. 10 patch 0  . fentaNYL (DURAGESIC - DOSED MCG/HR) 25 MCG/HR patch Place 1 patch (25 mcg total) onto the skin every 3 (three) days. Use this along with fentanyl patch 100 mcg [total 125 mcg] every 3 days 10 patch 0  . HYDROcodone-acetaminophen (NORCO) 10-325 MG tablet Take 1 tablet by mouth every 8 (eight) hours as needed. 90 tablet 0  . metoprolol tartrate (LOPRESSOR) 25 MG tablet Take 0.5 tablets (12.5 mg total) by mouth 2 (two) times daily. 30 tablet 3  . nitroGLYCERIN (NITROSTAT) 0.4 MG SL tablet Place 0.4 mg  under the tongue every 5 (five) minutes as needed.     . polyethylene glycol (MIRALAX / GLYCOLAX) packet Take 17 g by mouth daily as needed for moderate constipation.    . promethazine (PHENERGAN) 12.5 MG tablet Take 1-2 tablets (12.5-25 mg total) by mouth every 8 (eight) hours as needed for nausea or vomiting. 60 tablet 3  . Wheat Dextrin (BENEFIBER) POWD Stir 2 tsp. TID into 4-8 oz of any non-carbonated beverage or soft food (hot or cold) 500 g PRN  . XTANDI 40 MG capsule TAKE 4 CAPSULES (160 MG TOTAL) BY MOUTH DAILY. 120 capsule 3  . predniSONE (DELTASONE) 10 MG tablet Take 1 tablet (10 mg total) by mouth daily with breakfast. 30 tablet 3   No current facility-administered medications for this visit.     PHYSICAL EXAMINATION: ECOG PERFORMANCE STATUS: 1 - Symptomatic but completely ambulatory  BP (!) 161/79 (BP Location: Left Arm, Patient  Position: Sitting)   Pulse 64   Temp (!) 97.4 F (36.3 C) (Tympanic)   Resp 16   Wt 175 lb 9.5 oz (79.6 kg)   BMI 24.49 kg/m   Filed Weights   09/25/17 1337  Weight: 175 lb 9.5 oz (79.6 kg)    Physical Exam  Constitutional: He is oriented to person, place, and time.   Frail-appearing Caucasian male patient.  He is walking with a cane.  Accompanied by his wife.  HENT:  Head: Normocephalic and atraumatic.  Mouth/Throat: Oropharynx is clear and moist. No oropharyngeal exudate.  Eyes: Pupils are equal, round, and reactive to light.  Neck: Normal range of motion. Neck supple.  Cardiovascular: Normal rate and regular rhythm.  Pulmonary/Chest: No respiratory distress. He has no wheezes.  Decreased breath sounds bilaterally at the bases.  No wheeze or crackles  Abdominal: Soft. Bowel sounds are normal. He exhibits no distension and no mass. There is no tenderness. There is no rebound and no guarding.  Musculoskeletal: Normal range of motion. He exhibits no edema or tenderness.  Neurological: He is alert and oriented to person, place, and time.   Skin: Skin is warm.  Psychiatric: Affect normal.       LABORATORY DATA:  I have reviewed the data as listed    Component Value Date/Time   NA 139 09/24/2017 1405   NA 140 07/13/2013 1542   K 4.4 09/24/2017 1405   K 4.3 07/13/2013 1542   CL 106 09/24/2017 1405   CL 107 07/13/2013 1542   CO2 24 09/24/2017 1405   CO2 26 07/13/2013 1542   GLUCOSE 121 (H) 09/24/2017 1405   GLUCOSE 106 (H) 07/13/2013 1542   BUN 16 09/24/2017 1405   BUN 11 07/13/2013 1542   CREATININE 0.87 09/24/2017 1405   CREATININE 1.01 07/13/2013 1542   CALCIUM 9.6 09/24/2017 1405   CALCIUM 9.9 07/13/2013 1542   PROT 7.0 09/24/2017 1405   PROT 8.1 07/13/2013 1542   ALBUMIN 4.3 09/24/2017 1405   ALBUMIN 4.3 07/13/2013 1542   AST 20 09/24/2017 1405   AST 26 07/13/2013 1542   ALT 15 09/24/2017 1405   ALT 32 07/13/2013 1542   ALKPHOS 37 (L) 09/24/2017 1405   ALKPHOS 74 07/13/2013 1542   BILITOT 0.5 09/24/2017 1405   BILITOT 0.4 07/13/2013 1542   GFRNONAA >60 09/24/2017 1405   GFRNONAA >60 07/13/2013 1542   GFRAA >60 09/24/2017 1405   GFRAA >60 07/13/2013 1542    No results found for: SPEP, UPEP  Lab Results  Component Value Date   WBC 7.4 09/24/2017   NEUTROABS 6.1 09/24/2017   HGB 12.7 (L) 09/24/2017   HCT 37.4 (L) 09/24/2017   MCV 94.2 09/24/2017   PLT 180 09/24/2017      Chemistry      Component Value Date/Time   NA 139 09/24/2017 1405   NA 140 07/13/2013 1542   K 4.4 09/24/2017 1405   K 4.3 07/13/2013 1542   CL 106 09/24/2017 1405   CL 107 07/13/2013 1542   CO2 24 09/24/2017 1405   CO2 26 07/13/2013 1542   BUN 16 09/24/2017 1405   BUN 11 07/13/2013 1542   CREATININE 0.87 09/24/2017 1405   CREATININE 1.01 07/13/2013 1542      Component Value Date/Time   CALCIUM 9.6 09/24/2017 1405   CALCIUM 9.9 07/13/2013 1542   ALKPHOS 37 (L) 09/24/2017 1405   ALKPHOS 74 07/13/2013 1542   AST 20 09/24/2017 1405   AST 26 07/13/2013  1542   ALT 15 09/24/2017 1405   ALT 32 07/13/2013 1542    BILITOT 0.5 09/24/2017 1405   BILITOT 0.4 07/13/2013 1542       RADIOGRAPHIC STUDIES: I have personally reviewed the radiological images as listed and agreed with the findings in the report. No results found.   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  PSA rising; but pt not compliant.  Recommend compliance.  If continues to rise then recommend repeat imaging.  #Bone mets continue Xgeva.  STABLE; cont every 6 w.    # Fatigue-worse.  Re-start prednisone 10mg  /day [patient preference.]  #Malignancy related pain-STABLE Continue fentanyl patch hydrocodone.  # follow up with 6 weeks/ X-geva; Lupron bmp/PSA/cbc.    Orders Placed This Encounter  Procedures  . Basic metabolic panel    Standing Status:   Future    Standing Expiration Date:   09/25/2018  . CBC with Differential    Standing Status:   Future    Standing Expiration Date:   09/25/2018  . PSA    Standing Status:   Future    Standing Expiration Date:   09/26/2018   All questions were answered. The patient knows to call the clinic with any problems, questions or concerns.      [name], MD 09/25/2017 2:31 PM

## 2017-09-25 NOTE — Patient Instructions (Signed)

## 2017-10-15 ENCOUNTER — Other Ambulatory Visit: Payer: 59

## 2017-10-15 DIAGNOSIS — I10 Essential (primary) hypertension: Secondary | ICD-10-CM

## 2017-10-15 DIAGNOSIS — E782 Mixed hyperlipidemia: Secondary | ICD-10-CM | POA: Diagnosis not present

## 2017-10-15 DIAGNOSIS — R7303 Prediabetes: Secondary | ICD-10-CM

## 2017-10-16 LAB — BASIC METABOLIC PANEL WITH GFR
BUN: 15 mg/dL (ref 7–25)
CALCIUM: 9.3 mg/dL (ref 8.6–10.3)
CO2: 28 mmol/L (ref 20–32)
CREATININE: 0.76 mg/dL (ref 0.70–1.25)
Chloride: 104 mmol/L (ref 98–110)
GFR, EST NON AFRICAN AMERICAN: 95 mL/min/{1.73_m2} (ref >=60)
GFR, Est African American: 110 mL/min/{1.73_m2} (ref >=60)
Glucose, Bld: 160 mg/dL — ABNORMAL HIGH (ref 65–99)
POTASSIUM: 4 mmol/L (ref 3.5–5.3)
Sodium: 139 mmol/L (ref 135–146)

## 2017-10-16 LAB — LIPID PANEL
Cholesterol: 171 mg/dL (ref <200)
HDL: 58 mg/dL (ref >40)
LDL Cholesterol (Calc): 85 mg/dL (calc)
NON-HDL CHOLESTEROL (CALC): 113 mg/dL (ref <130)
TRIGLYCERIDES: 181 mg/dL — AB (ref <150)
Total CHOL/HDL Ratio: 2.9 (calc) (ref <5.0)

## 2017-10-16 LAB — HEMOGLOBIN A1C
HEMOGLOBIN A1C: 5.8 %{Hb} — AB (ref <5.7)
Mean Plasma Glucose: 120 (calc)
eAG (mmol/L): 6.6 (calc)

## 2017-10-16 MED FILL — XTANDI 40 MG CAPSULE: 40 | 30 days supply | Qty: 120 | Fill #1

## 2017-10-17 ENCOUNTER — Other Ambulatory Visit: Payer: Self-pay | Admitting: Internal Medicine

## 2017-10-17 DIAGNOSIS — G893 Neoplasm related pain (acute) (chronic): Secondary | ICD-10-CM

## 2017-10-17 DIAGNOSIS — C61 Malignant neoplasm of prostate: Secondary | ICD-10-CM

## 2017-10-17 DIAGNOSIS — C7951 Secondary malignant neoplasm of bone: Principal | ICD-10-CM

## 2017-10-18 MED ORDER — FENTANYL 100 MCG/HR TD PT72: 100.0000 ug | MEDICATED_PATCH | TRANSDERMAL | 0 refills | Status: DC

## 2017-10-18 MED ORDER — HYDROCODONE-ACETAMINOPHEN 10-325 MG PO TABS: 1.0000 | ORAL_TABLET | Freq: Three times a day (TID) | ORAL | 0 refills | Status: DC | PRN

## 2017-10-18 MED ORDER — FENTANYL 25 MCG/HR TD PT72: 25.0000 ug | MEDICATED_PATCH | TRANSDERMAL | 0 refills | Status: DC

## 2017-10-19 ENCOUNTER — Telehealth: Payer: Self-pay

## 2017-10-19 ENCOUNTER — Encounter: Payer: Self-pay | Admitting: Family Medicine

## 2017-10-19 ENCOUNTER — Ambulatory Visit (INDEPENDENT_AMBULATORY_CARE_PROVIDER_SITE_OTHER): Payer: 59 | Admitting: Family Medicine

## 2017-10-19 VITALS — BP 134/84 | HR 62 | Temp 98.1°F | Resp 16 | Ht 71.0 in | Wt 176.0 lb

## 2017-10-19 DIAGNOSIS — I25118 Atherosclerotic heart disease of native coronary artery with other forms of angina pectoris: Secondary | ICD-10-CM | POA: Diagnosis not present

## 2017-10-19 DIAGNOSIS — R5382 Chronic fatigue, unspecified: Secondary | ICD-10-CM | POA: Diagnosis not present

## 2017-10-19 DIAGNOSIS — Z23 Encounter for immunization: Secondary | ICD-10-CM

## 2017-10-19 DIAGNOSIS — R7303 Prediabetes: Secondary | ICD-10-CM

## 2017-10-19 DIAGNOSIS — G629 Polyneuropathy, unspecified: Secondary | ICD-10-CM

## 2017-10-19 DIAGNOSIS — I10 Essential (primary) hypertension: Secondary | ICD-10-CM | POA: Diagnosis not present

## 2017-10-19 DIAGNOSIS — E782 Mixed hyperlipidemia: Secondary | ICD-10-CM

## 2017-10-19 DIAGNOSIS — Z0000 Encounter for general adult medical examination without abnormal findings: Secondary | ICD-10-CM

## 2017-10-19 DIAGNOSIS — D8989 Other specified disorders involving the immune mechanism, not elsewhere classified: Secondary | ICD-10-CM

## 2017-10-19 DIAGNOSIS — R232 Flushing: Secondary | ICD-10-CM

## 2017-10-19 MED ORDER — METOPROLOL TARTRATE 25 MG PO TABS: 12.5000 mg | ORAL_TABLET | Freq: Two times a day (BID) | ORAL | 1 refills | Status: DC

## 2017-10-19 MED ORDER — GABAPENTIN 100 MG PO CAPS: ORAL_CAPSULE | ORAL | 0 refills | Status: DC

## 2017-10-19 MED FILL — predniSONE 10 MG TABS: 10 | 30 days supply | Qty: 30 | Fill #0

## 2017-10-19 NOTE — Assessment & Plan Note (Signed)
Controlled HTN, prior low BP in past - Home BP readings improved Complication with CAD, prostate CA   Plan:  1. Continue low dose BP medication - Metoprolol 12.5mg  BID (half tab 25) - Remain off Lisinopril 2.5mg  per cardiology due to low BP 2. Encourage improved lifestyle - low sodium diet, regular exercise as tolerated 3. Continue monitor BP outside office, bring readings to next visit, if persistently >140/90 or new symptoms notify office sooner 4. Follow-up 6 months

## 2017-10-19 NOTE — Patient Instructions (Addendum)
Thank you for coming to the office today.  High dose flu shot today - may take up to 2 weeks for immunity or for full effect.  Please schedule and return for a NURSE ONLY VISIT for VACCINE - Approximately around 2 weeks from now - Need Pneumonia Vaccine - Pneumovax-23 injection - then done with pneumonia vaccines  ------------------  Refilled Metoprolol   Sent a message w/ results to Dr [name], they will try to schedule to follow-up and discuss other cholesterol options if needed  ---------------------  Start Gabapentin 100mg  capsules, take at night for 2-3 nights only, and then increase to 2 times a day for a few days, and then may increase to 3 times a day, it may make you drowsy, if helps significantly at night only, then you can increase instead to 3 capsules at night, instead of 3 times a day - In the future if needed, we can significantly increase the dose if tolerated well, some common doses are 300mg  three times a day up to 600mg  three times a day, usually it takes several weeks or months to get to higher doses  -------------  1. Chemistry - Normal results, including electrolytes, kidney and liver function. - Significantly elevated fasting blood sugar   2. Hemoglobin A1c (Pre-Diabetes) - 5.8, significantly improved, previous 6.4  3. Cholesterol - Normal HDL (good cholesterol), normal LDL (bad cholesterol), and mild elevated Triglycerides.  Controlled on statin medication.   Please schedule a Follow-up Appointment to: Return in about 6 months (around 04/20/2018) for PreDM A1c, Hot flashes.  If you have any other questions or concerns, please feel free to call the office or send a message through [place]. You may also schedule an earlier appointment if necessary.  Additionally, you may be receiving a survey about your experience at our office within a few days to 1 week by e-mail or mail. We value your feedback.  Nobie Putnam, DO [place]

## 2017-10-19 NOTE — Assessment & Plan Note (Signed)
Improved PreDM control A1c down to 5.8 from 6s Improved lifestyle  Plan:  1. Not on any therapy currently  2. Encourage improved lifestyle - low carb, low sugar diet, reduce portion size, continue improving regular exercise 3. Follow-up q 6 mo A1c

## 2017-10-19 NOTE — Telephone Encounter (Signed)
x

## 2017-10-19 NOTE — Assessment & Plan Note (Signed)
Secondary to cancer/chemotherapy

## 2017-10-19 NOTE — Telephone Encounter (Signed)
Key: [id number] - PA Case ID: [id number]  PA submitted for fentanyl patch. Will await determination.

## 2017-10-19 NOTE — Assessment & Plan Note (Signed)
Asymptomatic currently, but has known stable angina Followed by [facility]iology, last hospitalization CP 08/2016 with unremarkable results, previously prior stent 04/2016, prior STEMI 04/2015 - On med management

## 2017-10-19 NOTE — Assessment & Plan Note (Signed)
Chronic problem Secondary to chemo Will restart Gabapentin for hot flashes as well - titrate up

## 2017-10-19 NOTE — Assessment & Plan Note (Signed)
Mostly controlled cholesterol on statin and lifestyle LDL still >70 at 85 now on max statin Last lipid panel 10/2017 Known CAD Followed by Cards  Plan: 1. Continue current meds - w/ Atorvastatin 80mg  daily - may benefit from change of therapy or add on vs PCSK9 2. Encourage improved lifestyle - low carb/cholesterol, reduce portion size, continue improving  regular exercise Follow-up w/ cardiology

## 2017-10-19 NOTE — Progress Notes (Signed)
Subjective:    Patient ID: Robert Short, male    DOB: [birth year], 67 y.o.   MRN: [id number]  Robert Short is a 67 y.o. male presenting on 10/19/2017 for Annual Exam   HPI   Specialists: Oncology - Dr Charlaine Dalton [facility]) Cardiology - Dr Ida Rogue [facility])  Here for Annual Physical and Lab.  Pre-Diabetes: Previous A1c trend elevated low 6s. Now A1c up to 6.4, attributed to lifestyle diet CBGs:None Meds:None - continues on chronic prednisone 5mg  daily Currently off ACEi Lifestyle: - Diet (Reduced sugar in diet, less sweets, he drinks more coconut water and some water, less soda, and more yogurt and other healthier options) - Exercise (significantly improved exercise and activity now, he tries to push himself more, and has more energy) Denies hypoglycemia  CHRONIC HTN Home BP normal. He had history of lower BP in past, Cardiology took him OFF Lisinopril 2.5mg  daily earlier this year 05/2017 Current Meds - Metoprolol 12.5mg  BID (half of 25mg  tab) Reports good compliance, took meds today. Tolerating well, w/o complaints.  Prostate Cancer, metastatic to bone Followed by Dr Rogue Bussing [facility], has recently 04/2017 been recommended to try new chemotherapy agent Xtandi, he is hesitant at the moment still considering, he wishes to get dental work done first.  HYPERLIPIDEMIA: - Reports no concerns. Last lipid panel 10/2017, mostly controlled but with LDL not at goal < 70 - Currently taking Atorvastatin 80mg , tolerating well without side effects or myalgias  Hot flashes Reports chronic issue with some worsening hot flashes, any time of day and night, seems related to side effect to chemo and possibly prednisone. Previously on Paxil, Effexor years ago for mood, also was on gabapentin in past but taken off.  PMH - CAD, stable angina, s/p recent stent in 04/2016. History of STEMI 04/2015, continues on Plavix, ASA, Statin lipitor    Recently increased  exercise and activity, he has been trying to force himself to do more activity  Health Maintenance: Due for Flu Shot, will receive today  Due for 2nd pneumonia vaccine >1 year after previous vaccine, he defers to get now and will return in 2 weeks to receive Pneumovax-23   Depression screen [facility] 2/9 04/18/2017 01/30/2017 10/18/2016  Decreased Interest 0 1 0  Down, Depressed, Hopeless 0 1 0  PHQ - 2 Score 0 2 0  Altered sleeping - 1 -  Tired, decreased energy - 1 -  Change in appetite - 2 -  Feeling bad or failure about yourself  - 1 -  Trouble concentrating - 1 -  Moving slowly or fidgety/restless - 0 -  Suicidal thoughts - 1 -  PHQ-9 Score - 9 -  Difficult doing work/chores - Somewhat difficult -  Some recent data might be hidden    Past Medical History:  Diagnosis Date  . Back pain 10/09/2012  . Bone cancer ([place])   . CAD (coronary artery disease)    a. 08/2015 PCI: LM nl, LAD 20d, LCX nl, RCA 32m, RPAV 95 (3.0x18 Xience Alpine DES);  b. 04/2016 PCI [facility]): RPL 95 (2.75x8 Promus Premier DES).  . Cancer associated pain   . Depression   . History of echocardiogram    a. 08/2016 Echo: EF 50-55%.  Marland Kitchen History of kidney stones   . Hyperlipidemia   . Hypertension   . Joint pain   . Prostate cancer [facility])    a.  s/p prostatectomy ([facility]);  b. bone mets noted 04/2016.  . Right  arm pain 01/10/2016  . Right foot pain 01/10/2016  . Right leg pain 01/10/2016  . Tobacco abuse    Past Surgical History:  Procedure Laterality Date  . CARDIAC CATHETERIZATION     [facility]  . CARDIAC CATHETERIZATION N/A 08/16/2015   Procedure: Left Heart Cath and Coronary Angiography;  Surgeon: Yolonda Kida, MD;  Location: [place] CV LAB;  Service: Cardiovascular;  Laterality: N/A;  . CARDIAC CATHETERIZATION N/A 08/16/2015   Procedure: Coronary Stent Intervention;  Surgeon: Yolonda Kida, MD;  Location: [place] CV LAB;  Service: Cardiovascular;  Laterality: N/A;  . CERVIX SURGERY    .  PROSTATECTOMY    . SPINE SURGERY    . TONSILLECTOMY    . WRIST SURGERY     Social History   Socioeconomic History  . Marital status: Married    Spouse name: Not on file  . Number of children: Not on file  . Years of education: Not on file  . Highest education level: Not on file  Occupational History  . Occupation: Disabled    Comment: since 1999 - chronic back pain and DDD.  Social Needs  . Financial resource strain: Not hard at all  . Food insecurity:    Worry: Never true    Inability: Never true  . Transportation needs:    Medical: No    Non-medical: No  Tobacco Use  . Smoking status: Current Every Day Smoker    Packs/day: 1.00    Years: 40.00    Pack years: 40.00    Types: Cigarettes  . Smokeless tobacco: Current User  Substance and Sexual Activity  . Alcohol use: Yes    Frequency: Never    Comment: occasionally  . Drug use: No  . Sexual activity: Not on file  Lifestyle  . Physical activity:    Days per week: 0 days    Minutes per session: 0 min  . Stress: Not at all  Relationships  . Social connections:    Talks on phone: Once a week    Gets together: Once a week    Attends religious service: More than 4 times per year    Active member of club or organization: No    Attends meetings of clubs or organizations: Never    Relationship status: Married  . Intimate partner violence:    Fear of current or ex partner: No    Emotionally abused: No    Physically abused: No    Forced sexual activity: No  Other Topics Concern  . Not on file  Social History Narrative   Lives in [place] with wife.  Does not routinely exercise.   Family History  Problem Relation Age of Onset  . Heart attack Mother   . Hypertension Mother   . Heart attack Father    Current Outpatient Medications on File Prior to Visit  Medication Sig  . acetaminophen (TYLENOL) 500 MG tablet Take 1,000 mg by mouth every 8 (eight) hours as needed.   Marland Kitchen albuterol (PROVENTIL HFA;VENTOLIN HFA) 108  (90 Base) MCG/ACT inhaler Inhale 1-2 puffs into the lungs every 6 (six) hours as needed for wheezing or shortness of breath.  Marland Kitchen aspirin 81 MG tablet Take 81 mg by mouth daily.  Marland Kitchen atorvastatin (LIPITOR) 80 MG tablet TAKE 1 TABLET (80 MG TOTAL) BY MOUTH DAILY AT 6 PM.  . clopidogrel (PLAVIX) 75 MG tablet TAKE 1 TABLET (75 MG TOTAL) BY MOUTH DAILY.  . fentaNYL (DURAGESIC - DOSED MCG/HR) 100 MCG/HR Place  1 patch (100 mcg total) onto the skin every 3 (three) days. Along with 62mcg patch every 3 days.  . fentaNYL (DURAGESIC - DOSED MCG/HR) 25 MCG/HR patch Place 1 patch (25 mcg total) onto the skin every 3 (three) days. Use this along with fentanyl patch 100 mcg [total 125 mcg] every 3 days  . HYDROcodone-acetaminophen (NORCO) 10-325 MG tablet Take 1 tablet by mouth every 8 (eight) hours as needed.  . nitroGLYCERIN (NITROSTAT) 0.4 MG SL tablet Place 0.4 mg under the tongue every 5 (five) minutes as needed.   . polyethylene glycol (MIRALAX / GLYCOLAX) packet Take 17 g by mouth daily as needed for moderate constipation.  . predniSONE (DELTASONE) 10 MG tablet Take 1 tablet (10 mg total) by mouth daily with breakfast.  . promethazine (PHENERGAN) 12.5 MG tablet Take 1-2 tablets (12.5-25 mg total) by mouth every 8 (eight) hours as needed for nausea or vomiting.  . Wheat Dextrin (BENEFIBER) POWD Stir 2 tsp. TID into 4-8 oz of any non-carbonated beverage or soft food (hot or cold)  . XTANDI 40 MG capsule TAKE 4 CAPSULES (160 MG TOTAL) BY MOUTH DAILY.   No current facility-administered medications on file prior to visit.     Review of Systems  Constitutional: Negative for activity change, appetite change, chills, diaphoresis, fatigue and fever.  HENT: Negative for congestion and hearing loss.   Eyes: Negative for visual disturbance.  Respiratory: Negative for apnea, cough, choking, chest tightness, shortness of breath and wheezing.   Cardiovascular: Negative for chest pain, palpitations and leg swelling.    Gastrointestinal: Negative for abdominal pain, anal bleeding, blood in stool, constipation, diarrhea, nausea and vomiting.  Endocrine: Negative for cold intolerance.  Genitourinary: Negative for difficulty urinating, dysuria, frequency and hematuria.  Musculoskeletal: Negative for arthralgias, back pain and neck pain.  Skin: Negative for rash.  Allergic/Immunologic: Negative for environmental allergies.  Neurological: Negative for dizziness, weakness, light-headedness, numbness and headaches.  Hematological: Negative for adenopathy.  Psychiatric/Behavioral: Negative for behavioral problems, dysphoric mood and sleep disturbance. The patient is not nervous/anxious.    Per HPI unless specifically indicated above     Objective:    BP 134/84 (BP Location: Left Arm, Cuff Size: Normal)   Pulse 62   Temp 98.1 F (36.7 C) (Oral)   Resp 16   Ht 5\' 11"  (1.803 m)   Wt 176 lb (79.8 kg)   BMI 24.55 kg/m   Wt Readings from Last 3 Encounters:  10/19/17 176 lb (79.8 kg)  09/25/17 175 lb 9.5 oz (79.6 kg)  08/14/17 173 lb 1 oz (78.5 kg)    Physical Exam  Constitutional: He is oriented to person, place, and time. He appears well-developed and well-nourished. No distress.  Well-appearing, comfortable, cooperative  HENT:  Head: Normocephalic and atraumatic.  Mouth/Throat: Oropharynx is clear and moist.  Frontal / maxillary sinuses non-tender. Nares patent without purulence or edema. Bilateral TMs clear without erythema, effusion or bulging. Oropharynx clear without erythema, exudates, edema or asymmetry.  Eyes: Pupils are equal, round, and reactive to light. Conjunctivae and EOM are normal. Right eye exhibits no discharge. Left eye exhibits no discharge.  Neck: Normal range of motion. Neck supple. No thyromegaly present.  Cardiovascular: Normal rate, regular rhythm, normal heart sounds and intact distal pulses.  No murmur heard. Pulmonary/Chest: Effort normal and breath sounds normal. No  respiratory distress. He has no wheezes. He has no rales.  Abdominal: Soft. Bowel sounds are normal. He exhibits no distension and no mass. There is no  tenderness.  Musculoskeletal: Normal range of motion. He exhibits no edema or tenderness.  Upper / Lower Extremities: - Normal muscle tone, strength bilateral upper extremities 5/5, lower extremities 5/5  Lymphadenopathy:    He has no cervical adenopathy.  Neurological: He is alert and oriented to person, place, and time.  Distal sensation intact to light touch all extremities  Skin: Skin is warm and dry. No rash noted. He is not diaphoretic. No erythema.  R posterior neck lower aspect soft fluctuant mass mobile 1.5 to 2 cm  Psychiatric: He has a normal mood and affect. His behavior is normal.  Well groomed, good eye contact, normal speech and thoughts  Nursing note and vitals reviewed.    Results for orders placed or performed in visit on 50/53/97  BASIC METABOLIC PANEL WITH GFR  Result Value Ref Range   Glucose, Bld 160 (H) 65 - 99 mg/dL   BUN 15 7 - 25 mg/dL   Creat 0.76 0.70 - 1.25 mg/dL   GFR, Est Non African American 95 > OR = 60 mL/min/1.36m2   GFR, Est African American 110 > OR = 60 mL/min/1.3m2   BUN/Creatinine Ratio NOT APPLICABLE 6 - 22 (calc)   Sodium 139 135 - 146 mmol/L   Potassium 4.0 3.5 - 5.3 mmol/L   Chloride 104 98 - 110 mmol/L   CO2 28 20 - 32 mmol/L   Calcium 9.3 8.6 - 10.3 mg/dL  Lipid panel  Result Value Ref Range   Cholesterol 171 <200 mg/dL   HDL 58 >40 mg/dL   Triglycerides 181 (H) <150 mg/dL   LDL Cholesterol (Calc) 85 mg/dL (calc)   Total CHOL/HDL Ratio 2.9 <5.0 (calc)   Non-HDL Cholesterol (Calc) 113 <130 mg/dL (calc)  Hemoglobin A1c  Result Value Ref Range   Hgb A1c MFr Bld 5.8 (H) <5.7 % of total Hgb   Mean Plasma Glucose 120 (calc)   eAG (mmol/L) 6.6 (calc)      Assessment & Plan:   Problem List Items Addressed This Visit    CAD (coronary artery disease)    Asymptomatic currently, but  has known stable angina Followed by [facility], last hospitalization CP 08/2016 with unremarkable results, previously prior stent 04/2016, prior STEMI 04/2015 - On med management      Relevant Medications   metoprolol tartrate (LOPRESSOR) 25 MG tablet   CFIDS (chronic fatigue and immune dysfunction syndrome) ([place])    Secondary to cancer/chemotherapy      Essential (primary) hypertension    Controlled HTN, prior low BP in past - Home BP readings improved Complication with CAD, prostate CA   Plan:  1. Continue low dose BP medication - Metoprolol 12.5mg  BID (half tab 25) - Remain off Lisinopril 2.5mg  per cardiology due to low BP 2. Encourage improved lifestyle - low sodium diet, regular exercise as tolerated 3. Continue monitor BP outside office, bring readings to next visit, if persistently >140/90 or new symptoms notify office sooner 4. Follow-up 6 months      Relevant Medications   metoprolol tartrate (LOPRESSOR) 25 MG tablet   HLD (hyperlipidemia)    Mostly controlled cholesterol on statin and lifestyle LDL still >70 at 85 now on max statin Last lipid panel 10/2017 Known CAD Followed by Cards  Plan: 1. Continue current meds - w/ Atorvastatin 80mg  daily - may benefit from change of therapy or add on vs PCSK9 2. Encourage improved lifestyle - low carb/cholesterol, reduce portion size, continue improving  regular exercise Follow-up w/ cardiology  Relevant Medications   metoprolol tartrate (LOPRESSOR) 25 MG tablet   Neuropathy ([facility])    Chronic problem Secondary to chemo Will restart Gabapentin for hot flashes as well - titrate up      Pre-diabetes    Improved PreDM control A1c down to 5.8 from 6s Improved lifestyle  Plan:  1. Not on any therapy currently  2. Encourage improved lifestyle - low carb, low sugar diet, reduce portion size, continue improving regular exercise 3. Follow-up q 6 mo A1c        Other Visit Diagnoses    Annual physical exam    -   Primary  Updated Health Maintenance information - Flu shot today - return 1-2 weeks for Pneumovax-23 Reviewed recent lab results with patient Encouraged improvement to lifestyle with diet and exercise -   Needs flu shot       Relevant Orders   Flu vaccine HIGH DOSE PF (Completed)   Hot flashes     Possibly secondary to medication / chemo / prednisone - trial on Gabapentin, titrate up as advised - Future may reconsider SSRI vs SNRI - paxil vs effexor    Relevant Medications   metoprolol tartrate (LOPRESSOR) 25 MG tablet   gabapentin (NEURONTIN) 100 MG capsule      Meds ordered this encounter  Medications  . metoprolol tartrate (LOPRESSOR) 25 MG tablet    Sig: Take 0.5 tablets (12.5 mg total) by mouth 2 (two) times daily.    Dispense:  90 tablet    Refill:  1  . gabapentin (NEURONTIN) 100 MG capsule    Sig: Start 1 capsule daily, increase by 1 cap every 2-3 days as tolerated up to 3 times a day, or may take 3 at once in evening.    Dispense:  90 capsule    Refill:  0    Follow up plan: Return in about 6 months (around 04/20/2018) for PreDM A1c, Hot flashes.  Nobie Putnam, DO [facility] 10/19/2017, 2:08 PM

## 2017-10-22 ENCOUNTER — Telehealth: Payer: Self-pay | Admitting: *Deleted

## 2017-10-22 NOTE — Telephone Encounter (Signed)
I attempted to call the patient on his cell #- no answer/ no voice mail.  I spoke with his wife (ok per DPR). She is aware of Robert Short recommendations to add Zetia 10 mg once daily to atorvastatin 80 mg once daily. Per Robert Short, they would rather wait on the addition of Zetia and follow up with Dr. [name] in the office first.   I advised that would be ok. Follow up appointment scheduled with Dr. [name] on Tuesday 12/04/17 at 3:20 pm.

## 2017-10-22 NOTE — Telephone Encounter (Signed)
-----   Message from Minna Merritts, MD sent at 10/20/2017  1:50 PM EDT ----- Regarding: RE: FYI - recent labs / lipids / follow-up Triage, Can we call patient and suggest he add zetia 10 mg daily for high cholesterol thx TG  ----- Message ----- From: Olin Hauser, DO Sent: 10/19/2017  11:11 PM EDT To: Minna Merritts, MD Subject: Juluis Rainier - recent labs / lipids / follow-up         Dr [name],  I saw this patient today for annual, had recent labs, Lipids with LDL 85, he plans to follow-up with you soon for routine visit. He is already on high intensity w/ Atorv 80. FYI for him to schedule w/ you and discuss other lipid options if you prefer w/ PCSK9 or other add on.  Thanks

## 2017-11-02 ENCOUNTER — Ambulatory Visit (INDEPENDENT_AMBULATORY_CARE_PROVIDER_SITE_OTHER): Payer: 59

## 2017-11-02 DIAGNOSIS — Z23 Encounter for immunization: Secondary | ICD-10-CM | POA: Diagnosis not present

## 2017-11-05 ENCOUNTER — Inpatient Hospital Stay: Payer: 59 | Attending: Internal Medicine

## 2017-11-05 DIAGNOSIS — K047 Periapical abscess without sinus: Secondary | ICD-10-CM | POA: Insufficient documentation

## 2017-11-05 DIAGNOSIS — C61 Malignant neoplasm of prostate: Secondary | ICD-10-CM | POA: Insufficient documentation

## 2017-11-05 DIAGNOSIS — C7951 Secondary malignant neoplasm of bone: Secondary | ICD-10-CM | POA: Insufficient documentation

## 2017-11-05 DIAGNOSIS — Z79899 Other long term (current) drug therapy: Secondary | ICD-10-CM | POA: Diagnosis not present

## 2017-11-05 LAB — BASIC METABOLIC PANEL
Anion gap: 10 (ref 5–15)
BUN: 17 mg/dL (ref 8–23)
CO2: 28 mmol/L (ref 22–32)
Calcium: 9.4 mg/dL (ref 8.9–10.3)
Chloride: 100 mmol/L (ref 98–111)
Creatinine, Ser: 0.72 mg/dL (ref 0.61–1.24)
GFR calc Af Amer: >60 mL/min (ref >60)
GFR calc non Af Amer: >60 mL/min (ref >60)
GLUCOSE: 114 mg/dL — AB (ref 70–99)
POTASSIUM: 4.5 mmol/L (ref 3.5–5.1)
Sodium: 138 mmol/L (ref 135–145)

## 2017-11-05 LAB — CBC WITH DIFFERENTIAL/PLATELET
ABS IMMATURE GRANULOCYTES: 0.05 10*3/uL (ref 0.00–0.07)
BASOS ABS: 0 10*3/uL (ref 0.0–0.1)
BASOS PCT: 0 %
Eosinophils Absolute: 0 10*3/uL (ref 0.0–0.5)
Eosinophils Relative: 0 %
HCT: 40.1 % (ref 39.0–52.0)
HEMOGLOBIN: 12.7 g/dL — AB (ref 13.0–17.0)
Immature Granulocytes: 1 %
Lymphocytes Relative: 12 %
Lymphs Abs: 1 10*3/uL (ref 0.7–4.0)
MCH: 30.5 pg (ref 26.0–34.0)
MCHC: 31.7 g/dL (ref 30.0–36.0)
MCV: 96.4 fL (ref 80.0–100.0)
Monocytes Absolute: 0.5 10*3/uL (ref 0.1–1.0)
Monocytes Relative: 6 %
NEUTROS ABS: 6.9 10*3/uL (ref 1.7–7.7)
NRBC: 0 % (ref 0.0–0.2)
Neutrophils Relative %: 81 %
Platelets: 211 10*3/uL (ref 150–400)
RBC: 4.16 MIL/uL — AB (ref 4.22–5.81)
RDW: 14.3 % (ref 11.5–15.5)
WBC: 8.6 10*3/uL (ref 4.0–10.5)

## 2017-11-05 LAB — PSA: Prostatic Specific Antigen: 12.7 ng/mL — ABNORMAL HIGH (ref 0.00–4.00)

## 2017-11-06 ENCOUNTER — Encounter: Payer: Self-pay | Admitting: Internal Medicine

## 2017-11-06 ENCOUNTER — Inpatient Hospital Stay: Payer: 59

## 2017-11-06 ENCOUNTER — Inpatient Hospital Stay (HOSPITAL_BASED_OUTPATIENT_CLINIC_OR_DEPARTMENT_OTHER): Payer: 59 | Admitting: Internal Medicine

## 2017-11-06 VITALS — BP 172/111 | HR 80 | Temp 97.8°F | Resp 16 | Wt 179.0 lb

## 2017-11-06 DIAGNOSIS — Z79899 Other long term (current) drug therapy: Secondary | ICD-10-CM

## 2017-11-06 DIAGNOSIS — C61 Malignant neoplasm of prostate: Secondary | ICD-10-CM | POA: Diagnosis not present

## 2017-11-06 DIAGNOSIS — K047 Periapical abscess without sinus: Secondary | ICD-10-CM | POA: Diagnosis not present

## 2017-11-06 DIAGNOSIS — C7951 Secondary malignant neoplasm of bone: Principal | ICD-10-CM

## 2017-11-06 MED ORDER — LEUPROLIDE ACETATE (3 MONTH) 22.5 MG IM KIT
22.5000 mg | PACK | Freq: Once | INTRAMUSCULAR | Status: AC
  Administered 2017-11-06: 22.5 mg via INTRAMUSCULAR

## 2017-11-06 MED ORDER — AMOXICILLIN 500 MG PO CAPS: 500.0000 mg | ORAL_CAPSULE | Freq: Three times a day (TID) | ORAL | 0 refills | Status: DC

## 2017-11-06 NOTE — Assessment & Plan Note (Addendum)
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 currently on X-tandi 3/day.; Lupron every 3 months [11/06/17].  PSA fairly stable.  #Clinically stable; continue Xtandi at current dose.  Poor tolerance at high doses.  # Bone metastases on Xgeva; HOLD sec to X-geva sec to dental issues/see below  # Tooth infection- recommend Amoxicllin 500 mg TID; HOLD off x-geva. Recommend dental evaluation/pcp.   # Fatigue-continue prednisone 10 mg a day.  #Malignancy related pain-STABLE Continue fentanyl patch hydrocodone.  # DISPOSITION: # Lupron today; but NO X-geva today. follow up with 6 weeks//cbc/cmpPSA-Dr.B.

## 2017-11-06 NOTE — Progress Notes (Signed)
[facility] OFFICE PROGRESS NOTE  Patient Care Team: Olin Hauser, [name], [name], [name]  Cancer Staging No matching staging information was found for the patient.   Oncology History   # 2011- PROSTATE CANCER [Gleason 3+4]; s/p Prostatectomy [ also involved bladder neck/ECP; Dr.Polaseck]; July 2014- Biochemical recurrence [PSA 14]- started on Zoladex [Dr.Pandit]; lost to follow up.  # JAN 2017- STAGE IV METASTATIC PROSTATE Cancer to Bone- Feb 13th, 2017-  Lupron q 88m [~end of feb]; PSA: 1021; Declined Chemo; April 2017 [xofigo x6; Dr.Crystal]; AUG 2017- Zytiga + Prednisone BID. Bone scan-Jan 2018- improved skeletal metastases.   # MAY 1st 2019- START X-tandi [stopped zytiga/PSA- 8.8/rising]  # Mets to bone- start X-geva q 35M [May 30th]  # Smoker/ chronic pain/pain clinic   #[April 2018; [place]]- s/p stenting; on Plavix; --------------------------------------------------------------  DIAGNOSIS: [ ]  Castrate resistant prostate cancer  STAGE:   4    ;GOALS: Palliative  CURRENT/MOST RECENT THERAPY [April 2019]-Xtandi      Prostate cancer metastatic to bone ([place])   12/08/2014 Initial Diagnosis    Prostate cancer metastatic to bone [facility])       INTERVAL HISTORY:  Robert Short 67 y.o.  male pleasant patient above history of metastatic castrate resistant prostate cancer currently on Xtandi is here for follow-up.  Patient complains of worsening tooth pain the last few days.   States his chronic pain in his ribs and joints stable.  Continues to be on narcotic pain medication/patches.  Chronic moderate to severe fatigue.  He admits that he is taking his Xtandi 3 pills a day and regular basis.  Review of Systems  Constitutional: Positive for malaise/fatigue and weight loss. Negative for chills,  diaphoresis and fever.  HENT: Negative for nosebleeds and sore throat.   Eyes: Negative for double vision.  Respiratory: Positive for shortness of breath. Negative for cough, hemoptysis, sputum production and wheezing.   Cardiovascular: Negative for chest pain, palpitations, orthopnea and leg swelling.  Gastrointestinal: Positive for constipation. Negative for abdominal pain, blood in stool, diarrhea, heartburn, melena, nausea and vomiting.  Genitourinary: Negative for dysuria, frequency and urgency.  Musculoskeletal: Positive for back pain and joint pain.  Skin: Negative.  Negative for itching and rash.  Neurological: Negative for dizziness, tingling, focal weakness, weakness and headaches.  Endo/Heme/Allergies: Does not bruise/bleed easily.  Psychiatric/Behavioral: Negative for depression. The patient is not nervous/anxious and does not have insomnia.       PAST MEDICAL HISTORY :  Past Medical History:  Diagnosis Date  . Back pain 10/09/2012  . Bone cancer ([place])   . CAD (coronary artery disease)    a. 08/2015 PCI: LM nl, LAD 20d, LCX nl, RCA 68m, RPAV 95 (3.0x18 Xience Alpine DES);  b. 04/2016 PCI [facility]): RPL 95 (2.75x8 Promus Premier DES).  . Cancer associated pain   . Depression   . History of echocardiogram    a. 08/2016 Echo: EF 50-55%.  Marland Kitchen History of kidney stones   . Hyperlipidemia   . Hypertension   . Joint pain   . Prostate cancer [facility])    a.  s/p prostatectomy ([facility]);  b. bone mets noted 04/2016.  . Right arm pain 01/10/2016  . Right foot pain 01/10/2016  . Right leg pain 01/10/2016  . Tobacco abuse     PAST SURGICAL HISTORY :   Past Surgical History:  Procedure Laterality Date  . CARDIAC CATHETERIZATION     [facility]  . CARDIAC CATHETERIZATION N/A 08/16/2015   Procedure: Left Heart Cath and Coronary Angiography;  Surgeon: Yolonda Kida, MD;  Location: [facility] CV LAB;  Service: Cardiovascular;  Laterality: N/A;  . CARDIAC CATHETERIZATION N/A 08/16/2015    Procedure: Coronary Stent Intervention;  Surgeon: Yolonda Kida, MD;  Location: [facility] CV LAB;  Service: Cardiovascular;  Laterality: N/A;  . CERVIX SURGERY    . PROSTATECTOMY    . SPINE SURGERY    . TONSILLECTOMY    . WRIST SURGERY      FAMILY HISTORY :   Family History  Problem Relation Age of Onset  . Heart attack Mother   . Hypertension Mother   . Heart attack Father     SOCIAL HISTORY:   Social History   Tobacco Use  . Smoking status: Current Every Day Smoker    Packs/day: 1.00    Years: 40.00    Pack years: 40.00    Types: Cigarettes  . Smokeless tobacco: Current User  Substance Use Topics  . Alcohol use: Yes    Frequency: Never    Comment: occasionally  . Drug use: No    ALLERGIES:  is allergic to ditropan [oxybutynin].  MEDICATIONS:  Current Outpatient Medications  Medication Sig Dispense Refill  . acetaminophen (TYLENOL) 500 MG tablet Take 1,000 mg by mouth every 8 (eight) hours as needed.     Marland Kitchen albuterol (PROVENTIL HFA;VENTOLIN HFA) 108 (90 Base) MCG/ACT inhaler Inhale 1-2 puffs into the lungs every 6 (six) hours as needed for wheezing or shortness of breath. 1 Inhaler 0  . aspirin 81 MG tablet Take 81 mg by mouth daily.    Marland Kitchen atorvastatin (LIPITOR) 80 MG tablet TAKE 1 TABLET (80 MG TOTAL) BY MOUTH DAILY AT 6 PM. 90 tablet 1  . clopidogrel (PLAVIX) 75 MG tablet TAKE 1 TABLET (75 MG TOTAL) BY MOUTH DAILY. 90 tablet 2  . fentaNYL (DURAGESIC - DOSED MCG/HR) 100 MCG/HR Place 1 patch (100 mcg total) onto the skin every 3 (three) days. Along with 8mcg patch every 3 days. 10 patch 0  . fentaNYL (DURAGESIC - DOSED MCG/HR) 25 MCG/HR patch Place 1 patch (25 mcg total) onto the skin every 3 (three) days. Use this along with fentanyl patch 100 mcg [total 125 mcg] every 3 days 10 patch 0  . gabapentin (NEURONTIN) 100 MG capsule Start 1 capsule daily, increase by 1 cap every 2-3 days as tolerated up to 3 times a day, or may take 3 at once in evening. 90 capsule 0   . HYDROcodone-acetaminophen (NORCO) 10-325 MG tablet Take 1 tablet by mouth every 8 (eight) hours as needed. 90 tablet 0  . metoprolol tartrate (LOPRESSOR) 25 MG tablet Take 0.5 tablets (12.5 mg total) by mouth 2 (two) times daily. 90 tablet 1  . nitroGLYCERIN (NITROSTAT) 0.4 MG SL tablet Place 0.4 mg under the tongue every 5 (five) minutes as needed.     . polyethylene glycol (MIRALAX / GLYCOLAX) packet Take 17 g by mouth daily as needed for moderate constipation.    . predniSONE (DELTASONE) 10 MG tablet Take 1 tablet (10 mg total) by mouth daily with breakfast. 30 tablet 3  . promethazine (PHENERGAN) 12.5 MG tablet Take 1-2 tablets (12.5-25 mg total) by mouth every 8 (eight) hours as needed for nausea or vomiting. 60 tablet 3  . Wheat Dextrin (BENEFIBER) POWD Stir 2 tsp. TID into 4-8 oz of any  non-carbonated beverage or soft food (hot or cold) 500 g PRN  . XTANDI 40 MG capsule TAKE 4 CAPSULES (160 MG TOTAL) BY MOUTH DAILY. 120 capsule 3  . amoxicillin (AMOXIL) 500 MG capsule Take 1 capsule (500 mg total) by mouth 3 (three) times daily. 30 capsule 0   No current facility-administered medications for this visit.     PHYSICAL EXAMINATION: ECOG PERFORMANCE STATUS: 1 - Symptomatic but completely ambulatory  BP (!) 172/111 (BP Location: Left Arm, Patient Position: Sitting)   Pulse 80   Temp 97.8 F (36.6 C) (Tympanic)   Resp 16   Wt 179 lb (81.2 kg)   BMI 24.97 kg/m   Filed Weights   11/06/17 1406  Weight: 179 lb (81.2 kg)    Physical Exam  Constitutional: He is oriented to person, place, and time.   Frail-appearing Caucasian male patient.  He is walking with a cane.  Accompanied by his wife.  HENT:  Head: Normocephalic and atraumatic.  Mouth/Throat: Oropharynx is clear and moist. No oropharyngeal exudate.  Poor dentition.  Eyes: Pupils are equal, round, and reactive to light.  Neck: Normal range of motion. Neck supple.  Cardiovascular: Normal rate and regular rhythm.   Pulmonary/Chest: No respiratory distress. He has no wheezes.  Decreased breath sounds bilaterally at the bases.  No wheeze or crackles  Abdominal: Soft. Bowel sounds are normal. He exhibits no distension and no mass. There is no tenderness. There is no rebound and no guarding.  Musculoskeletal: Normal range of motion. He exhibits no edema or tenderness.  Neurological: He is alert and oriented to person, place, and time.  Skin: Skin is warm.  Psychiatric: Affect normal.       LABORATORY DATA:  I have reviewed the data as listed    Component Value Date/Time   NA 138 11/05/2017 1424   NA 140 07/13/2013 1542   K 4.5 11/05/2017 1424   K 4.3 07/13/2013 1542   CL 100 11/05/2017 1424   CL 107 07/13/2013 1542   CO2 28 11/05/2017 1424   CO2 26 07/13/2013 1542   GLUCOSE 114 (H) 11/05/2017 1424   GLUCOSE 106 (H) 07/13/2013 1542   BUN 17 11/05/2017 1424   BUN 11 07/13/2013 1542   CREATININE 0.72 11/05/2017 1424   CREATININE 0.76 10/15/2017 1001   CALCIUM 9.4 11/05/2017 1424   CALCIUM 9.9 07/13/2013 1542   PROT 7.0 09/24/2017 1405   PROT 8.1 07/13/2013 1542   ALBUMIN 4.3 09/24/2017 1405   ALBUMIN 4.3 07/13/2013 1542   AST 20 09/24/2017 1405   AST 26 07/13/2013 1542   ALT 15 09/24/2017 1405   ALT 32 07/13/2013 1542   ALKPHOS 37 (L) 09/24/2017 1405   ALKPHOS 74 07/13/2013 1542   BILITOT 0.5 09/24/2017 1405   BILITOT 0.4 07/13/2013 1542   GFRNONAA >60 11/05/2017 1424   GFRNONAA 95 10/15/2017 1001   GFRAA >60 11/05/2017 1424   GFRAA 110 10/15/2017 1001    No results found for: SPEP, UPEP  Lab Results  Component Value Date   WBC 8.6 11/05/2017   NEUTROABS 6.9 11/05/2017   HGB 12.7 (L) 11/05/2017   HCT 40.1 11/05/2017   MCV 96.4 11/05/2017   PLT 211 11/05/2017      Chemistry      Component Value Date/Time   NA 138 11/05/2017 1424   NA 140 07/13/2013 1542   K 4.5 11/05/2017 1424   K 4.3 07/13/2013 1542   CL 100 11/05/2017 1424   CL 107 07/13/2013  1542   CO2 28  11/05/2017 1424   CO2 26 07/13/2013 1542   BUN 17 11/05/2017 1424   BUN 11 07/13/2013 1542   CREATININE 0.72 11/05/2017 1424   CREATININE 0.76 10/15/2017 1001      Component Value Date/Time   CALCIUM 9.4 11/05/2017 1424   CALCIUM 9.9 07/13/2013 1542   ALKPHOS 37 (L) 09/24/2017 1405   ALKPHOS 74 07/13/2013 1542   AST 20 09/24/2017 1405   AST 26 07/13/2013 1542   ALT 15 09/24/2017 1405   ALT 32 07/13/2013 1542   BILITOT 0.5 09/24/2017 1405   BILITOT 0.4 07/13/2013 1542       RADIOGRAPHIC STUDIES: I have personally reviewed the radiological images as listed and agreed with the findings in the report. No results found.   ASSESSMENT & PLAN:  Prostate cancer metastatic to bone ([place]) # Castrate resistant prostate cancer metastases to bone. currently on X-tandi 3/day.; Lupron every 3 months [11/06/17].  PSA fairly stable.  #Clinically stable; continue Xtandi at current dose.  Poor tolerance at high doses.  # Bone metastases on Xgeva; HOLD sec to X-geva sec to dental issues/see below  # Tooth infection- recommend Amoxicllin 500 mg TID; HOLD off x-geva. Recommend dental evaluation/pcp.   # Fatigue-continue prednisone 10 mg a day.  #Malignancy related pain-STABLE Continue fentanyl patch hydrocodone.  # DISPOSITION: # Lupron today; but NO X-geva today. follow up with 6 weeks//cbc/cmpPSA-Dr.B.    No orders of the defined types were placed in this encounter.  All questions were answered. The patient knows to call the clinic with any problems, questions or concerns.      [name], MD 11/07/2017 1:03 PM

## 2017-11-06 NOTE — Patient Instructions (Signed)

## 2017-11-09 MED FILL — XTANDI 40 MG CAPSULE: 40 | 30 days supply | Qty: 120 | Fill #2

## 2017-11-12 MED FILL — predniSONE 10 MG TABS: 10 | 30 days supply | Qty: 30 | Fill #1

## 2017-11-14 ENCOUNTER — Other Ambulatory Visit: Payer: Self-pay | Admitting: Internal Medicine

## 2017-11-14 DIAGNOSIS — G893 Neoplasm related pain (acute) (chronic): Secondary | ICD-10-CM

## 2017-11-14 DIAGNOSIS — C7951 Secondary malignant neoplasm of bone: Principal | ICD-10-CM

## 2017-11-14 DIAGNOSIS — C61 Malignant neoplasm of prostate: Secondary | ICD-10-CM

## 2017-11-14 MED ORDER — FENTANYL 100 MCG/HR TD PT72: 100.0000 ug | MEDICATED_PATCH | TRANSDERMAL | 0 refills | Status: DC

## 2017-11-14 MED ORDER — FENTANYL 25 MCG/HR TD PT72: 25.0000 ug | MEDICATED_PATCH | TRANSDERMAL | 0 refills | Status: DC

## 2017-11-14 MED ORDER — HYDROCODONE-ACETAMINOPHEN 10-325 MG PO TABS: 1.0000 | ORAL_TABLET | Freq: Three times a day (TID) | ORAL | 0 refills | Status: DC | PRN

## 2017-11-14 NOTE — Telephone Encounter (Signed)
Patient called cancer center requesting refill of Fentanyl 125 mcg/hd and Norco 10-325mg  q 8 PRN.   As mandated by the [place] STOP Act (Strengthen Opioid Misuse Prevention), the [place] Controlled Substance Reporting System ([place]) was reviewed for this patient.  Below is the past 24-months of controlled substance prescriptions as displayed by the registry.  I have personally consulted with my supervising physician, Dr. Rogue Bussing, who agrees that continuation of opiate therapy is medically appropriate at this time and agrees to provide continual monitoring, including urine/blood drug screens, as indicated. Refill is appropriate on or after 11/16/17. Previously prescribed on 10/18/17 with a 30 day supply.   NCCSRS reviewed:     Faythe Casa, NP 11/14/2017 2:43 PM [phone]

## 2017-11-20 ENCOUNTER — Other Ambulatory Visit: Payer: Self-pay

## 2017-11-20 DIAGNOSIS — R232 Flushing: Secondary | ICD-10-CM

## 2017-11-21 ENCOUNTER — Other Ambulatory Visit: Payer: Self-pay | Admitting: Family Medicine

## 2017-11-21 DIAGNOSIS — R232 Flushing: Secondary | ICD-10-CM
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
## 2017-12-04 ENCOUNTER — Ambulatory Visit (INDEPENDENT_AMBULATORY_CARE_PROVIDER_SITE_OTHER): Payer: 59 | Admitting: Cardiovascular Disease

## 2017-12-04 ENCOUNTER — Encounter: Payer: Self-pay | Admitting: Cardiovascular Disease

## 2017-12-04 VITALS — BP 149/88 | HR 63 | Ht 71.0 in | Wt 175.5 lb

## 2017-12-04 DIAGNOSIS — R0602 Shortness of breath: Secondary | ICD-10-CM | POA: Diagnosis not present

## 2017-12-04 DIAGNOSIS — I208 Other forms of angina pectoris: Secondary | ICD-10-CM

## 2017-12-04 DIAGNOSIS — I25118 Atherosclerotic heart disease of native coronary artery with other forms of angina pectoris: Secondary | ICD-10-CM | POA: Diagnosis not present

## 2017-12-04 DIAGNOSIS — E7800 Pure hypercholesterolemia, unspecified: Secondary | ICD-10-CM

## 2017-12-04 DIAGNOSIS — C61 Malignant neoplasm of prostate: Secondary | ICD-10-CM | POA: Diagnosis not present

## 2017-12-04 DIAGNOSIS — I10 Essential (primary) hypertension: Secondary | ICD-10-CM

## 2017-12-04 MED ORDER — EZETIMIBE 10 MG PO TABS: 10.0000 mg | ORAL_TABLET | Freq: Every day | ORAL | 3 refills | Status: DC

## 2017-12-04 NOTE — Patient Instructions (Signed)
Medication Instructions:   Please start zetia one a day for cholesterol  If you need a refill on your cardiac medications before your next appointment, please call your pharmacy.    Lab work: No new labs needed   If you have labs (blood work) drawn today and your tests are completely normal, you will receive your results only by: . MyChart Message (if you have MyChart) OR . A paper copy in the mail If you have any lab test that is abnormal or we need to change your treatment, we will call you to review the results.   Testing/Procedures: No new testing needed   Follow-Up: At [facility], you and your health needs are our priority.  As part of our continuing mission to provide you with exceptional heart care, we have created designated Provider Care Teams.  These Care Teams include your primary Cardiologist (physician) and Advanced Practice Providers (APPs -  Physician Assistants and Nurse Practitioners) who all work together to provide you with the care you need, when you need it.  . You will need a follow up appointment in 12 months .   Please call our office 2 months in advance to schedule this appointment.    . Providers on your designated Care Team:   . Christopher Berge, NP . Ryan Dunn, PA-C . Jacquelyn Visser, PA-C  Any Other Special Instructions Will Be Listed Below (If Applicable).  For educational health videos Log in to : [email or web address] Or : [email or web address], password : [email or web address]   

## 2017-12-05 ENCOUNTER — Encounter: Payer: Self-pay | Admitting: Family Medicine

## 2017-12-05 ENCOUNTER — Ambulatory Visit (INDEPENDENT_AMBULATORY_CARE_PROVIDER_SITE_OTHER): Payer: 59 | Admitting: Family Medicine

## 2017-12-05 VITALS — BP 125/85 | HR 69 | Temp 98.4°F | Resp 16 | Ht 71.0 in | Wt 179.6 lb

## 2017-12-05 DIAGNOSIS — B0231 Zoster conjunctivitis: Secondary | ICD-10-CM | POA: Diagnosis not present

## 2017-12-05 MED ORDER — VALACYCLOVIR HCL 1 G PO TABS: 1000.0000 mg | ORAL_TABLET | Freq: Three times a day (TID) | ORAL | 0 refills | Status: DC

## 2017-12-05 MED FILL — XTANDI 40 MG CAPSULE: 40 | 30 days supply | Qty: 120 | Fill #3

## 2017-12-05 NOTE — Patient Instructions (Signed)
Thank you for coming to the office today.  Start the anti-viral medication - Valtrex take one 3 times a day for 7 days to complete the entire course. This will help the current flare up heal, including the rash to dry up and resolve quicker. It may take several days to weeks for the rash to completely resolve. Typically it will dry up and scab off.  Blister and Rash Care  Take a cool bath or apply cool compresses to the area of the rash or blisters as directed by your health care provider. This may help with pain and itching.  Keep your rash covered with a loose bandage (dressing). Wear loose-fitting clothing to help ease the pain of material rubbing against the rash.  Keep your rash and blisters clean with mild soap and cool water or as directed by your health care provider.  Check your rash every day for signs of infection. These include redness, swelling, and pain that lasts or increases.  Do not pick your blisters.  Do not scratch your rash.  As mentioned, some patients experience "Post-Herpetic Neuralgia" or a persistent sensation or feeling of "nerve irritation or pain" that can last for days to weeks to months or longer in this same area.  You may try the nerve medicine to help ease these symptoms now or in the future if it is needed. Try Gabapentin 100mg  capsules, take at night for 2-3 nights only, and then increase to 2 times a day for a few days, and then may increase to 3 times a day, it may make you drowsy, if helps significantly at night only, then you can increase instead to 3 capsules at night, instead of 3 times a day - In the future if needed, we can significantly increase the dose if tolerated well, some common doses are 300mg  three times a day up to 600mg  three times a day, usually it takes several weeks or months to get to higher doses  It is contagious to patients who have never had Chicken Pox - or someone who is pregnant (unborn baby is at risk) - or elderly with  weakened immune system. Try to avoid direct close contact with others on the skin if you have active blisters. Once the rash dries up and heals, it is less of a concern.  You may get the Shingles vaccine (Shingrix) approximately 6 weeks after the rash and episode is resolved. It is a two dose series of vaccines. You may get this at the pharmacy, as Medicare does not cover this vaccine at our office.   Contact your doctor if:  Your pain is not relieved with prescribed medicines.  Your pain does not get better after the rash heals.  Your rash looks infected. Signs of infection include redness, swelling, and pain that lasts or increases.  Please seek more immediate medical attention at the [facility] IF  The rash is on your face or nose.  You have facial pain, pain around your eye area, or loss of feeling on one side of your face.  You have ear pain or you have ringing in your ear.  You have loss of taste.  Your condition gets worse.    ------------------------------------------------------------------------------------------------------- Please review the additional information below about Shingles, if you have additional concerns.  Shingles Shingles, which is also known as herpes zoster, is an infection that causes a painful skin rash and fluid-filled blisters. Shingles is not related to genital herpes, which is a sexually transmitted infection.  Shingles only develops in people who:  Have had chickenpox.  Have received the chickenpox vaccine. (This is rare.)  What are the causes? Shingles is caused by varicella-zoster virus (VZV). This is the same virus that causes chickenpox. After exposure to VZV, the virus stays in the body in an inactive (dormant) state. Shingles develops if the virus reactivates. This can happen many years after the initial exposure to VZV. It is not known what causes this virus to reactivate. What increases the risk? People who have  had chickenpox or received the chickenpox vaccine are at risk for shingles. Infection is more common in people who:  Are older than age 54.  Have a weakened defense (immune) system, such as those with HIV, AIDS, or cancer.  Are taking medicines that weaken the immune system, such as transplant medicines.  Are under great stress.  What are the signs or symptoms? Early symptoms of this condition include itching, tingling, and pain in an area on your skin. Pain may be described as burning, stabbing, or throbbing. A few days or weeks after symptoms start, a painful red rash appears, usually on one side of the body in a bandlike or beltlike pattern. The rash eventually turns into fluid-filled blisters that break open, scab over, and dry up in about 2-3 weeks. At any time during the infection, you may also develop:  A fever.  Chills.  A headache.  An upset stomach.  How is this diagnosed? This condition is diagnosed with a skin exam. Sometimes, skin or fluid samples are taken from the blisters before a diagnosis is made. These samples are examined under a microscope or sent to a lab for testing. How is this treated? There is no specific cure for this condition. Your health care provider will probably prescribe medicines to help you manage pain, recover more quickly, and avoid long-term problems. Medicines may include:  Antiviral drugs.  Anti-inflammatory drugs.  Pain medicines.  If the area involved is on your face, you may be referred to a specialist, such as an eye doctor (ophthalmologist) or an ear, nose, and throat (ENT) doctor to help you avoid eye problems, chronic pain, or disability. Follow these instructions at home: Medicines  Take medicines only as directed by your health care provider.  Apply an anti-itch or numbing cream to the affected area as directed by your health care provider. General instructions  Rest as directed by your health care provider.  Keep all  follow-up visits as directed by your health care provider. This is important.  Until your blisters scab over, your infection can cause chickenpox in people who have never had it or been vaccinated against it. To prevent this from happening, avoid contact with other people, especially: ? Babies. ? Pregnant women. ? Children who have eczema. ? Elderly people who have transplants. ? People who have chronic illnesses, such as leukemia or AIDS. This information is not intended to replace advice given to you by your health care provider. Make sure you discuss any questions you have with your health care provider. Document Released: 12/19/2004 Document Revised: 08/15/2015 Document Reviewed: 10/30/2013 Elsevier Interactive Patient Education  2018 [employer].    Please schedule a Follow-up Appointment to: Return in about 1 week (around 12/12/2017), or if symptoms worsen or fail to improve, for shingles.  If you have any other questions or concerns, please feel free to call the office or send a message through [place]. You may also schedule an earlier appointment if necessary.  Additionally,  you may be receiving a survey about your experience at our office within a few days to 1 week by e-mail or mail. We value your feedback.  Nobie Putnam, DO [place]

## 2017-12-05 NOTE — Progress Notes (Signed)
Subjective:    Patient ID: Robert Short, male    DOB: 06/16/1950, 67 y.o.   MRN: [id number]  Robert Short is a 67 y.o. male presenting on 12/05/2017 for Herpes Zoster (onset 6 days started with swollen watery eye Right side )   HPI   HERPES ZOSTER Forehead / Conjunctivitis Acute onset symptoms 1 week ago approx with R eye irritation and bruising by his report, progressed with some upper eyelid swelling and redness, and then developed vesicular rash on forehead in pattern onto upper eyelid, seemed persistent for several days, he saw cardiologist yesterday and they thought it was shingles, he was asked to come here for evaluation. He has not taken any medicine for it or any eye drops. - he admits some eye discharge and drainage when wake up in AM and it is persistent Admits mild headache, improved with Tylenol Denies any active eye pain or redness, other eye involvement, sick contact, other rash, vision loss  Depression screen [facility] 2/9 12/05/2017 04/18/2017 01/30/2017  Decreased Interest 0 0 1  Down, Depressed, Hopeless 0 0 1  PHQ - 2 Score 0 0 2  Altered sleeping - - 1  Tired, decreased energy - - 1  Change in appetite - - 2  Feeling bad or failure about yourself  - - 1  Trouble concentrating - - 1  Moving slowly or fidgety/restless - - 0  Suicidal thoughts - - 1  PHQ-9 Score - - 9  Difficult doing work/chores - - Somewhat difficult  Some recent data might be hidden    Social History   Tobacco Use  . Smoking status: Current Every Day Smoker    Packs/day: 1.00    Years: 40.00    Pack years: 40.00    Types: Cigarettes  . Smokeless tobacco: Current User  Substance Use Topics  . Alcohol use: Yes    Frequency: Never    Comment: occasionally  . Drug use: No    Review of Systems Per HPI unless specifically indicated above     Objective:    BP 125/85   Pulse 69   Temp 98.4 F (36.9 C) (Oral)   Resp 16   Ht 5\' 11"  (1.803 m)   Wt 179 lb 9.6 oz (81.5 kg)   BMI 25.05 kg/m    Wt Readings from Last 3 Encounters:  12/05/17 179 lb 9.6 oz (81.5 kg)  12/04/17 175 lb 8 oz (79.6 kg)  11/06/17 179 lb (81.2 kg)    Physical Exam  Constitutional: He is oriented to person, place, and time. He appears well-developed and well-nourished. No distress.  Well-appearing, comfortable, cooperative  HENT:  Head: Normocephalic and atraumatic.  Mouth/Throat: Oropharynx is clear and moist.  Eyes: Pupils are equal, round, and reactive to light. EOM are normal. Right eye exhibits no discharge. Left eye exhibits no discharge.  R eye Conjunctiva mostly clear. Without obvious drainage, only few tears at the moment. No obvious visual ulceration or lesions on ophthalmoscopic view  Fluorosceine dye test performed without any identified corneal or other lesions within the eye. All of the dye under black light has pooled in corner or bottom of eye.  Cardiovascular: Normal rate.  Pulmonary/Chest: Effort normal.  Musculoskeletal: He exhibits no edema.  Neurological: He is alert and oriented to person, place, and time.  Skin: Skin is warm and dry. Rash (Right forehead scattered several scabbing vesicular lesions seem resolving. involving upper eyelid) noted. He is not diaphoretic. No erythema.  Psychiatric: He  has a normal mood and affect. His behavior is normal.  Well groomed, good eye contact, normal speech and thoughts  Nursing note and vitals reviewed.     Assessment & Plan:   Problem List Items Addressed This Visit    None    Visit Diagnoses    Herpes zoster conjunctivitis of right eye    -  Primary   Relevant Medications   valACYclovir (VALTREX) 1000 MG tablet      Clinically consistent with new acute shingles herpes zoster outbreak onset 7 days ago, with mild skin involvement rash forehead and R eyelid upper, and acute conjunctivitis symptoms. - No significant neuropathic pain symptoms - No loss of vision or eye pain - Never received zoster vaccine  Plan: - fluorescein eye  stain dye test on R eye performed, see exam - again negative without any identified lesions  1. Start Valacyclovir 1000mg  TID with food x 10 days, counseled on treatment course and side effects 2. Discussed potential course of post-herpetic neuralgia symptoms - in future may follow-up 3. Counseling on infectious nature of problem with avoiding close contact with high risk populations 4. Follow-up as needed within 4-6 weeks if not resolving or if residual pain, strict return criteria if facial or ocular involvement or other acute concerns  Additionally - bc of eye involvement, I have called Dr Theadore Nan and discussed this case with him, as he provides eye care for this patient routinely, he agreed with this treatment plan if no eye pain or lesions or ulceration on cornea that he may proceed with oral treatment. Patient was asked to follow-up later this week with Dr Matilde Sprang for 2nd opinion / evaluation, he will call tomorrow.   Meds ordered this encounter  Medications  . valACYclovir (VALTREX) 1000 MG tablet    Sig: Take 1 tablet (1,000 mg total) by mouth 3 (three) times daily. For shingles, for 10 days    Dispense:  30 tablet    Refill:  0    Follow up plan: Return in about 1 week (around 12/12/2017), or if symptoms worsen or fail to improve, for shingles.  Nobie Putnam, [facility] 12/05/2017, 4:32 PM

## 2017-12-06 DIAGNOSIS — H2513 Age-related nuclear cataract, bilateral: Secondary | ICD-10-CM | POA: Diagnosis not present

## 2017-12-06 DIAGNOSIS — B0239 Other herpes zoster eye disease: Secondary | ICD-10-CM | POA: Diagnosis not present

## 2017-12-06 MED FILL — predniSONE 10 MG TABS: 10 | 30 days supply | Qty: 30 | Fill #2

## 2017-12-10 ENCOUNTER — Other Ambulatory Visit: Payer: Self-pay | Admitting: *Deleted

## 2017-12-10 DIAGNOSIS — C61 Malignant neoplasm of prostate: Secondary | ICD-10-CM

## 2017-12-10 DIAGNOSIS — C7951 Secondary malignant neoplasm of bone: Principal | ICD-10-CM

## 2017-12-13 ENCOUNTER — Other Ambulatory Visit: Payer: Self-pay | Admitting: Oncology

## 2017-12-13 DIAGNOSIS — G893 Neoplasm related pain (acute) (chronic): Secondary | ICD-10-CM

## 2017-12-13 DIAGNOSIS — C61 Malignant neoplasm of prostate: Secondary | ICD-10-CM

## 2017-12-13 DIAGNOSIS — C7951 Secondary malignant neoplasm of bone: Principal | ICD-10-CM

## 2017-12-14 ENCOUNTER — Other Ambulatory Visit: Payer: Self-pay | Admitting: Oncology

## 2017-12-14 DIAGNOSIS — C7951 Secondary malignant neoplasm of bone: Principal | ICD-10-CM

## 2017-12-14 DIAGNOSIS — G893 Neoplasm related pain (acute) (chronic): Secondary | ICD-10-CM

## 2017-12-14 DIAGNOSIS — C61 Malignant neoplasm of prostate: Secondary | ICD-10-CM

## 2017-12-14 MED ORDER — FENTANYL 25 MCG/HR TD PT72: 25.0000 ug | MEDICATED_PATCH | TRANSDERMAL | 0 refills | Status: DC

## 2017-12-14 MED ORDER — FENTANYL 100 MCG/HR TD PT72: 100.0000 ug | MEDICATED_PATCH | TRANSDERMAL | 0 refills | Status: DC

## 2017-12-14 MED ORDER — HYDROCODONE-ACETAMINOPHEN 10-325 MG PO TABS: 1.0000 | ORAL_TABLET | Freq: Three times a day (TID) | ORAL | 0 refills | Status: DC | PRN

## 2017-12-14 NOTE — Telephone Encounter (Signed)
Patient called [facility] requesting refill of fentanyl and norco.   As mandated by the [place] STOP Act (Strengthen Opioid Misuse Prevention), the [place] Controlled Substance Reporting System ([place]) was reviewed for this patient.  Below is the past 79-months of controlled substance prescriptions as displayed by the registry.  I have personally consulted with my supervising physician, Dr. Rogue Bussing, who agrees that continuation of opiate therapy is medically appropriate at this time and agrees to provide continual monitoring, including urine/blood drug screens, as indicated. Prescription sent electronically using [employer] secure transmission to requested pharmacy.   Based on his overdose risk score and MME/day would recommend considering naloxone prescription at next appointment.   [place] Reviewed:     Beckey Rutter, DNP, AGNP-C [place] at [facility] [phone] (work cell) [phone] (office) 12/14/17 10:59 AM

## 2017-12-17 ENCOUNTER — Inpatient Hospital Stay: Payer: 59 | Attending: Internal Medicine

## 2017-12-17 DIAGNOSIS — C61 Malignant neoplasm of prostate: Secondary | ICD-10-CM | POA: Insufficient documentation

## 2017-12-17 DIAGNOSIS — Z7982 Long term (current) use of aspirin: Secondary | ICD-10-CM | POA: Insufficient documentation

## 2017-12-17 DIAGNOSIS — C7951 Secondary malignant neoplasm of bone: Secondary | ICD-10-CM | POA: Insufficient documentation

## 2017-12-17 DIAGNOSIS — I10 Essential (primary) hypertension: Secondary | ICD-10-CM | POA: Diagnosis not present

## 2017-12-17 DIAGNOSIS — F17210 Nicotine dependence, cigarettes, uncomplicated: Secondary | ICD-10-CM | POA: Insufficient documentation

## 2017-12-17 DIAGNOSIS — Z79899 Other long term (current) drug therapy: Secondary | ICD-10-CM | POA: Diagnosis not present

## 2017-12-17 LAB — COMPREHENSIVE METABOLIC PANEL
ALT: 14 U/L (ref 0–44)
AST: 18 U/L (ref 15–41)
Albumin: 4.1 g/dL (ref 3.5–5.0)
Alkaline Phosphatase: 54 U/L (ref 38–126)
Anion gap: 7 (ref 5–15)
BUN: 12 mg/dL (ref 8–23)
CO2: 26 mmol/L (ref 22–32)
Calcium: 9.5 mg/dL (ref 8.9–10.3)
Chloride: 104 mmol/L (ref 98–111)
Creatinine, Ser: 0.71 mg/dL (ref 0.61–1.24)
GFR calc Af Amer: >60 mL/min (ref >60)
Glucose, Bld: 117 mg/dL — ABNORMAL HIGH (ref 70–99)
Potassium: 4 mmol/L (ref 3.5–5.1)
Sodium: 137 mmol/L (ref 135–145)
Total Bilirubin: 0.3 mg/dL (ref 0.3–1.2)
Total Protein: 7.1 g/dL (ref 6.5–8.1)

## 2017-12-17 LAB — CBC WITH DIFFERENTIAL/PLATELET
Abs Immature Granulocytes: 0.03 10*3/uL (ref 0.00–0.07)
Basophils Absolute: 0 10*3/uL (ref 0.0–0.1)
Basophils Relative: 0 %
Eosinophils Absolute: 0.1 10*3/uL (ref 0.0–0.5)
Eosinophils Relative: 1 %
HCT: 38.7 % — ABNORMAL LOW (ref 39.0–52.0)
HEMOGLOBIN: 12.7 g/dL — AB (ref 13.0–17.0)
Immature Granulocytes: 0 %
Lymphocytes Relative: 16 %
Lymphs Abs: 1.4 10*3/uL (ref 0.7–4.0)
MCH: 31 pg (ref 26.0–34.0)
MCHC: 32.8 g/dL (ref 30.0–36.0)
MCV: 94.4 fL (ref 80.0–100.0)
MONO ABS: 0.7 10*3/uL (ref 0.1–1.0)
Monocytes Relative: 8 %
Neutro Abs: 6.4 10*3/uL (ref 1.7–7.7)
Neutrophils Relative %: 75 %
Platelets: 198 10*3/uL (ref 150–400)
RBC: 4.1 MIL/uL — ABNORMAL LOW (ref 4.22–5.81)
RDW: 14.2 % (ref 11.5–15.5)
WBC: 8.6 10*3/uL (ref 4.0–10.5)
nRBC: 0 % (ref 0.0–0.2)

## 2017-12-17 LAB — PSA: Prostatic Specific Antigen: 16.11 ng/mL — ABNORMAL HIGH (ref 0.00–4.00)

## 2017-12-18 ENCOUNTER — Encounter: Payer: Self-pay | Admitting: Internal Medicine

## 2017-12-18 ENCOUNTER — Ambulatory Visit: Payer: 59 | Admitting: Internal Medicine

## 2017-12-18 ENCOUNTER — Inpatient Hospital Stay (HOSPITAL_BASED_OUTPATIENT_CLINIC_OR_DEPARTMENT_OTHER): Payer: 59 | Admitting: Internal Medicine

## 2017-12-18 VITALS — BP 188/100 | HR 66 | Temp 97.3°F | Wt 176.8 lb

## 2017-12-18 DIAGNOSIS — C7951 Secondary malignant neoplasm of bone: Secondary | ICD-10-CM

## 2017-12-18 DIAGNOSIS — Z7982 Long term (current) use of aspirin: Secondary | ICD-10-CM

## 2017-12-18 DIAGNOSIS — I10 Essential (primary) hypertension: Secondary | ICD-10-CM

## 2017-12-18 DIAGNOSIS — C61 Malignant neoplasm of prostate: Secondary | ICD-10-CM | POA: Diagnosis not present

## 2017-12-18 DIAGNOSIS — F17210 Nicotine dependence, cigarettes, uncomplicated: Secondary | ICD-10-CM

## 2017-12-18 DIAGNOSIS — R6884 Jaw pain: Secondary | ICD-10-CM

## 2017-12-18 DIAGNOSIS — Z79899 Other long term (current) drug therapy: Secondary | ICD-10-CM | POA: Diagnosis not present

## 2017-12-18 NOTE — Patient Instructions (Signed)
#  Continue to hold Xtandi; while awaiting on the CT scan of the jaw.

## 2017-12-18 NOTE — Assessment & Plan Note (Addendum)
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 Currently on X-tandi 3/day.; Lupron every 3 months [11/06/17].  PSA-rising; worsening  #Discussed my concerns of rising PSA; however patient has been intermittently taking his Xtandi given his jaw pain/difficulty swallowing [see discussion below].  #Swelling under the chin-tender to touch-question dental abscess/drain poor dentition.  Recently treated with antibiotics.  Not resolved.  Recommend CT of the jaw ASAP.  Also await dental evaluation.  # Bone metastases on Xgeva;on HOLD sec to X-geva sec to dental issues/see below'\; last X-geva- on 09/25/2017.  See above  # Fatigue-continue prednisone 10 mg a day.  #Malignancy related pain-stable continue fentanyl patch hydrocodone.  # Shingles- of trigeminal involvement- s/p acyclovir; s/p eval by opthalmology.  Improved  # DISPOSITION: # CT jaw-asap ordered-we will call with results. # follow up MD- 4 weeks//cbc/cmpPSA-Dr.B.

## 2017-12-18 NOTE — Progress Notes (Signed)
[facility] OFFICE PROGRESS NOTE  Patient Care Team: Olin Hauser, [name], [name], [name]  Cancer Staging No matching staging information was found for the patient.   Oncology History   # 2011- PROSTATE CANCER [Gleason 3+4]; s/p Prostatectomy [ also involved bladder neck/ECP; Dr.Polaseck]; July 2014- Biochemical recurrence [PSA 14]- started on Zoladex [Dr.Pandit]; lost to follow up.  # JAN 2017- STAGE IV METASTATIC PROSTATE Cancer to Bone- Feb 13th, 2017-  Lupron q 49m [~end of feb]; PSA: 1021; Declined Chemo; April 2017 [xofigo x6; Dr.Crystal]; AUG 2017- Zytiga + Prednisone BID. Bone scan-Jan 2018- improved skeletal metastases.   # MAY 1st 2019- START X-tandi [stopped zytiga/PSA- 8.8/rising]  # Mets to bone- start X-geva q 25M [May 30th]  # Smoker/ chronic pain/pain clinic   #[April 2018; [place]]- s/p stenting; on Plavix; --------------------------------------------------------------  DIAGNOSIS: [ ]  Castrate resistant prostate cancer  STAGE:   4    ;GOALS: Palliative  CURRENT/MOST RECENT THERAPY [April 2019]-Xtandi      Prostate cancer metastatic to bone ([place])   12/08/2014 Initial Diagnosis    Prostate cancer metastatic to bone [facility])       INTERVAL HISTORY:  Robert Short 67 y.o.  male pleasant patient above history of metastatic castrate resistant prostate cancer currently on Xtandi is here for follow-up.  Patient states that he was recently diagnosed with shingles of his right eye area.  He has been treated with acyclovir.  Also eval by ophthalmology.  No obvious involvement of the eye.  Patient has been taking his Xtandi intermittently.  He states that his jaw has been hurting limiting swallowing.  He has poor dentition.  He is awaiting to see dentistry.  Last Delton See was about 3 months ago  September 27.  Review of Systems  Constitutional: Positive for malaise/fatigue and weight loss. Negative for chills, diaphoresis and fever.  HENT: Negative for nosebleeds and sore throat.   Eyes: Negative for double vision.  Respiratory: Positive for shortness of breath. Negative for cough, hemoptysis, sputum production and wheezing.   Cardiovascular: Negative for chest pain, palpitations, orthopnea and leg swelling.  Gastrointestinal: Positive for constipation. Negative for abdominal pain, blood in stool, diarrhea, heartburn, melena, nausea and vomiting.  Genitourinary: Negative for dysuria, frequency and urgency.  Musculoskeletal: Positive for back pain and joint pain.  Skin: Negative.  Negative for itching and rash.  Neurological: Negative for dizziness, tingling, focal weakness, weakness and headaches.  Endo/Heme/Allergies: Does not bruise/bleed easily.  Psychiatric/Behavioral: Negative for depression. The patient is not nervous/anxious and does not have insomnia.       PAST MEDICAL HISTORY :  Past Medical History:  Diagnosis Date  . Back pain 10/09/2012  . Bone cancer ([place])   . CAD (coronary artery disease)    a. 08/2015 PCI: LM nl, LAD 20d, LCX nl, RCA 39m, RPAV 95 (3.0x18 Xience Alpine DES);  b. 04/2016 PCI [facility]): RPL 95 (2.75x8 Promus Premier DES).  . Cancer associated pain   . Depression   . History of echocardiogram    a. 08/2016 Echo: EF 50-55%.  Marland Kitchen History of kidney stones   . Hyperlipidemia   . Hypertension   . Joint pain   . Prostate cancer [facility])    a.  s/p prostatectomy ([facility]);  b. bone mets noted 04/2016.  . Right arm pain 01/10/2016  . Right foot pain 01/10/2016  .  Right leg pain 01/10/2016  . Tobacco abuse     PAST SURGICAL HISTORY :   Past Surgical History:  Procedure Laterality Date  . CARDIAC CATHETERIZATION     [facility]  . CARDIAC CATHETERIZATION N/A 08/16/2015   Procedure: Left Heart Cath and Coronary Angiography;  Surgeon: Yolonda Kida, MD;   Location: [facility] CV LAB;  Service: Cardiovascular;  Laterality: N/A;  . CARDIAC CATHETERIZATION N/A 08/16/2015   Procedure: Coronary Stent Intervention;  Surgeon: Yolonda Kida, MD;  Location: [facility] CV LAB;  Service: Cardiovascular;  Laterality: N/A;  . CERVIX SURGERY    . PROSTATECTOMY    . SPINE SURGERY    . TONSILLECTOMY    . WRIST SURGERY      FAMILY HISTORY :   Family History  Problem Relation Age of Onset  . Heart attack Mother   . Hypertension Mother   . Heart attack Father     SOCIAL HISTORY:   Social History   Tobacco Use  . Smoking status: Current Every Day Smoker    Packs/day: 1.00    Years: 40.00    Pack years: 40.00    Types: Cigarettes  . Smokeless tobacco: Current User  Substance Use Topics  . Alcohol use: Yes    Frequency: Never    Comment: occasionally  . Drug use: No    ALLERGIES:  is allergic to ditropan [oxybutynin].  MEDICATIONS:  Current Outpatient Medications  Medication Sig Dispense Refill  . acetaminophen (TYLENOL) 500 MG tablet Take 1,000 mg by mouth every 8 (eight) hours as needed.     Marland Kitchen albuterol (PROVENTIL HFA;VENTOLIN HFA) 108 (90 Base) MCG/ACT inhaler Inhale 1-2 puffs into the lungs every 6 (six) hours as needed for wheezing or shortness of breath. 1 Inhaler 0  . aspirin 81 MG tablet Take 81 mg by mouth daily.    Marland Kitchen atorvastatin (LIPITOR) 80 MG tablet TAKE 1 TABLET (80 MG TOTAL) BY MOUTH DAILY AT 6 PM. 90 tablet 1  . clopidogrel (PLAVIX) 75 MG tablet TAKE 1 TABLET (75 MG TOTAL) BY MOUTH DAILY. 90 tablet 2  . ezetimibe (ZETIA) 10 MG tablet Take 1 tablet (10 mg total) by mouth daily. 90 tablet 3  . fentaNYL (DURAGESIC - DOSED MCG/HR) 100 MCG/HR Place 1 patch (100 mcg total) onto the skin every 3 (three) days. Along with 53mcg patch every 3 days. 10 patch 0  . fentaNYL (DURAGESIC - DOSED MCG/HR) 25 MCG/HR patch Place 1 patch (25 mcg total) onto the skin every 3 (three) days. Use this along with fentanyl patch 100 mcg [total  125 mcg] every 3 days 10 patch 0  . gabapentin (NEURONTIN) 100 MG capsule START 1 CAPSULE DAILY, INCREASE BY 1 CAP EVERY 2-3 DAYS AS TOLERATED UP TO 3 TIMES A DAY, OR MAY TAKE 3 AT ONCE IN [place]. 90 capsule 5  . HYDROcodone-acetaminophen (NORCO) 10-325 MG tablet Take 1 tablet by mouth every 8 (eight) hours as needed. 90 tablet 0  . metoprolol tartrate (LOPRESSOR) 25 MG tablet Take 0.5 tablets (12.5 mg total) by mouth 2 (two) times daily. 90 tablet 1  . nitroGLYCERIN (NITROSTAT) 0.4 MG SL tablet Place 0.4 mg under the tongue every 5 (five) minutes as needed.     . polyethylene glycol (MIRALAX / GLYCOLAX) packet Take 17 g by mouth daily as needed for moderate constipation.    . predniSONE (DELTASONE) 10 MG tablet Take 1 tablet (10 mg total) by mouth daily with breakfast. 30 tablet 3  .  promethazine (PHENERGAN) 12.5 MG tablet Take 1-2 tablets (12.5-25 mg total) by mouth every 8 (eight) hours as needed for nausea or vomiting. 60 tablet 3  . valACYclovir (VALTREX) 1000 MG tablet Take 1 tablet (1,000 mg total) by mouth 3 (three) times daily. For shingles, for 10 days 30 tablet 0  . Wheat Dextrin (BENEFIBER) POWD Stir 2 tsp. TID into 4-8 oz of any non-carbonated beverage or soft food (hot or cold) 500 g PRN  . XTANDI 40 MG capsule TAKE 4 CAPSULES (160 MG TOTAL) BY MOUTH DAILY. 120 capsule 3   No current facility-administered medications for this visit.     PHYSICAL EXAMINATION: ECOG PERFORMANCE STATUS: 1 - Symptomatic but completely ambulatory  BP (!) 188/100 (BP Location: Left Arm, Patient Position: Sitting)   Pulse 66   Temp (!) 97.3 F (36.3 C) (Tympanic)   Wt 176 lb 12.2 oz (80.2 kg)   BMI 24.65 kg/m   Filed Weights   12/18/17 1427  Weight: 176 lb 12.2 oz (80.2 kg)    Physical Exam  Constitutional: He is oriented to person, place, and time.   Frail-appearing Caucasian male patient.  He is walking with a cane.  Accompanied by his wife.  HENT:  Head: Normocephalic and atraumatic.   Mouth/Throat: Oropharynx is clear and moist. No oropharyngeal exudate.  Poor dentition.  Eyes: Pupils are equal, round, and reactive to light.  Neck: Normal range of motion. Neck supple.  Mild swelling under the chin tender.  Soft.  Cardiovascular: Normal rate and regular rhythm.  Pulmonary/Chest: No respiratory distress. He has no wheezes.  Decreased breath sounds bilaterally at the bases.  No wheeze or crackles  Abdominal: Soft. Bowel sounds are normal. He exhibits no distension and no mass. There is no abdominal tenderness. There is no rebound and no guarding.  Musculoskeletal: Normal range of motion.        General: No tenderness or edema.  Neurological: He is alert and oriented to person, place, and time.  Skin: Skin is warm.  Psychiatric: Affect normal.       LABORATORY DATA:  I have reviewed the data as listed    Component Value Date/Time   NA 137 12/17/2017 1348   NA 140 07/13/2013 1542   K 4.0 12/17/2017 1348   K 4.3 07/13/2013 1542   CL 104 12/17/2017 1348   CL 107 07/13/2013 1542   CO2 26 12/17/2017 1348   CO2 26 07/13/2013 1542   GLUCOSE 117 (H) 12/17/2017 1348   GLUCOSE 106 (H) 07/13/2013 1542   BUN 12 12/17/2017 1348   BUN 11 07/13/2013 1542   CREATININE 0.71 12/17/2017 1348   CREATININE 0.76 10/15/2017 1001   CALCIUM 9.5 12/17/2017 1348   CALCIUM 9.9 07/13/2013 1542   PROT 7.1 12/17/2017 1348   PROT 8.1 07/13/2013 1542   ALBUMIN 4.1 12/17/2017 1348   ALBUMIN 4.3 07/13/2013 1542   AST 18 12/17/2017 1348   AST 26 07/13/2013 1542   ALT 14 12/17/2017 1348   ALT 32 07/13/2013 1542   ALKPHOS 54 12/17/2017 1348   ALKPHOS 74 07/13/2013 1542   BILITOT 0.3 12/17/2017 1348   BILITOT 0.4 07/13/2013 1542   GFRNONAA >60 12/17/2017 1348   GFRNONAA 95 10/15/2017 1001   GFRAA >60 12/17/2017 1348   GFRAA 110 10/15/2017 1001    No results found for: SPEP, UPEP  Lab Results  Component Value Date   WBC 8.6 12/17/2017   NEUTROABS 6.4 12/17/2017   HGB 12.7 (L)  12/17/2017  HCT 38.7 (L) 12/17/2017   MCV 94.4 12/17/2017   PLT 198 12/17/2017      Chemistry      Component Value Date/Time   NA 137 12/17/2017 1348   NA 140 07/13/2013 1542   K 4.0 12/17/2017 1348   K 4.3 07/13/2013 1542   CL 104 12/17/2017 1348   CL 107 07/13/2013 1542   CO2 26 12/17/2017 1348   CO2 26 07/13/2013 1542   BUN 12 12/17/2017 1348   BUN 11 07/13/2013 1542   CREATININE 0.71 12/17/2017 1348   CREATININE 0.76 10/15/2017 1001      Component Value Date/Time   CALCIUM 9.5 12/17/2017 1348   CALCIUM 9.9 07/13/2013 1542   ALKPHOS 54 12/17/2017 1348   ALKPHOS 74 07/13/2013 1542   AST 18 12/17/2017 1348   AST 26 07/13/2013 1542   ALT 14 12/17/2017 1348   ALT 32 07/13/2013 1542   BILITOT 0.3 12/17/2017 1348   BILITOT 0.4 07/13/2013 1542       RADIOGRAPHIC STUDIES: I have personally reviewed the radiological images as listed and agreed with the findings in the report. No results found.   ASSESSMENT & PLAN:  Prostate cancer metastatic to bone ([place]) # Castrate resistant prostate cancer metastases to bone. Currently on X-tandi 3/day.; Lupron every 3 months [11/06/17].  PSA-rising; worsening  #Discussed my concerns of rising PSA; however patient has been intermittently taking his Xtandi given his jaw pain/difficulty swallowing [see discussion below].  #Swelling under the chin-tender to touch-question dental abscess/drain poor dentition.  Recently treated with antibiotics.  Not resolved.  Recommend CT of the jaw ASAP.  Also await dental evaluation.  # Bone metastases on Xgeva;on HOLD sec to X-geva sec to dental issues/see below'\; last X-geva- on 09/25/2017.  See above  # Fatigue-continue prednisone 10 mg a day.  #Malignancy related pain-stable continue fentanyl patch hydrocodone.  # Shingles- of trigeminal involvement- s/p acyclovir; s/p eval by opthalmology.  Improved  # DISPOSITION: # CT jaw-asap ordered.  # follow up MD- 4 weeks//cbc/cmpPSA-Dr.B.     Orders Placed This Encounter  Procedures  . CT MAXILLOFACIAL W & WO CONTRAST    Standing Status:   Future    Standing Expiration Date:   03/19/2019    Order Specific Question:   ** REASON FOR EXAM (FREE TEXT)    Answer:   submental swelling; poor dentition;    Order Specific Question:   If indicated for the ordered procedure, I authorize the administration of contrast media per Radiology protocol    Answer:   Yes    Order Specific Question:   Preferred imaging location?    Answer:   [facility] [place]    Order Specific Question:   Radiology Contrast Protocol - do NOT remove file path    Answer:   [email or web address]   All questions were answered. The patient knows to call the clinic with any problems, questions or concerns.      [name], MD 12/18/2017 3:09 PM

## 2017-12-19 ENCOUNTER — Ambulatory Visit
Admission: RE | Admit: 2017-12-19 | Discharge: 2017-12-19 | Disposition: A | Discharge status: Home / Self Care | Payer: 59 | Source: Ambulatory Visit | Attending: Internal Medicine | Admitting: Internal Medicine

## 2017-12-19 DIAGNOSIS — C7951 Secondary malignant neoplasm of bone: Secondary | ICD-10-CM | POA: Diagnosis not present

## 2017-12-19 DIAGNOSIS — L03211 Cellulitis of face: Secondary | ICD-10-CM | POA: Diagnosis not present

## 2017-12-19 DIAGNOSIS — C61 Malignant neoplasm of prostate: Secondary | ICD-10-CM | POA: Insufficient documentation

## 2017-12-19 DIAGNOSIS — R6884 Jaw pain: Secondary | ICD-10-CM | POA: Insufficient documentation

## 2017-12-19 MED ORDER — IOHEXOL 300 MG/ML  SOLN
75.0000 mL | Freq: Once | INTRAMUSCULAR | Status: AC | PRN
  Administered 2017-12-19: 75 mL via INTRAVENOUS

## 2017-12-20 ENCOUNTER — Telehealth: Payer: Self-pay | Admitting: Internal Medicine

## 2017-12-20 MED ORDER — AMOXICILLIN-POT CLAVULANATE 875-125 MG PO TABS: 1.0000 | ORAL_TABLET | Freq: Two times a day (BID) | ORAL | 0 refills | Status: DC

## 2017-12-20 NOTE — Telephone Encounter (Signed)
Reviewed the CT scan- concerning for infection; but no abscess.  Recommend Augmentin twice a day for 7 days; dentist eval ASAP  Discussed with the patient's wife over the phone she agrees.  FYI-

## 2018-01-03 ENCOUNTER — Other Ambulatory Visit: Payer: Self-pay | Admitting: Internal Medicine

## 2018-01-03 DIAGNOSIS — C7951 Secondary malignant neoplasm of bone: Principal | ICD-10-CM

## 2018-01-03 DIAGNOSIS — C61 Malignant neoplasm of prostate: Secondary | ICD-10-CM

## 2018-01-10 ENCOUNTER — Telehealth: Payer: Self-pay | Admitting: Cardiovascular Disease

## 2018-01-10 NOTE — Telephone Encounter (Signed)
Called and spoke with Lexine Baton to verify if patient will have anestesia. Patient will only be numbed lidocaine with epinephrine if the dentist feels necessary. Routing to [place] to address blood thinner.

## 2018-01-10 NOTE — Telephone Encounter (Signed)
Patient on Plavix and aspirin.  Last saw Dr [name] on 12/2017.

## 2018-01-10 NOTE — Telephone Encounter (Signed)
1. What dental office are you calling from? [facility] in [place]   2. What is your office phone number? [phone]   3. What is your fax number? [phone]  4. What type of procedure is the patient having performed? 8 Teeth Extractions    5. What date is procedure scheduled or is the patient there now? TBD  (if the patient is at the dentist's office question goes to their cardiologist if he/she is in the office.  If not, question should go to the DOD).   6. What is your question (ex. Antibiotics prior to procedure, holding medication-we need to know how long dentist wants pt to hold med)? When and how long to hold blood thinners   When calling ask for Myriam Jacobson or [place]

## 2018-01-11 ENCOUNTER — Inpatient Hospital Stay: Payer: 59 | Attending: Internal Medicine

## 2018-01-11 DIAGNOSIS — Z9079 Acquired absence of other genital organ(s): Secondary | ICD-10-CM | POA: Insufficient documentation

## 2018-01-11 DIAGNOSIS — Z79899 Other long term (current) drug therapy: Secondary | ICD-10-CM | POA: Diagnosis not present

## 2018-01-11 DIAGNOSIS — C61 Malignant neoplasm of prostate: Secondary | ICD-10-CM | POA: Insufficient documentation

## 2018-01-11 DIAGNOSIS — I10 Essential (primary) hypertension: Secondary | ICD-10-CM | POA: Diagnosis not present

## 2018-01-11 DIAGNOSIS — C7951 Secondary malignant neoplasm of bone: Secondary | ICD-10-CM | POA: Diagnosis not present

## 2018-01-11 DIAGNOSIS — F17210 Nicotine dependence, cigarettes, uncomplicated: Secondary | ICD-10-CM | POA: Insufficient documentation

## 2018-01-11 DIAGNOSIS — Z7982 Long term (current) use of aspirin: Secondary | ICD-10-CM | POA: Insufficient documentation

## 2018-01-11 DIAGNOSIS — K047 Periapical abscess without sinus: Secondary | ICD-10-CM | POA: Insufficient documentation

## 2018-01-11 LAB — CBC WITH DIFFERENTIAL/PLATELET
Abs Immature Granulocytes: 0.03 10*3/uL (ref 0.00–0.07)
Basophils Absolute: 0 10*3/uL (ref 0.0–0.1)
Basophils Relative: 0 %
Eosinophils Absolute: 0.1 10*3/uL (ref 0.0–0.5)
Eosinophils Relative: 2 %
HCT: 40.4 % (ref 39.0–52.0)
Hemoglobin: 13.3 g/dL (ref 13.0–17.0)
IMMATURE GRANULOCYTES: 0 %
Lymphocytes Relative: 28 %
Lymphs Abs: 2 10*3/uL (ref 0.7–4.0)
MCH: 31.8 pg (ref 26.0–34.0)
MCHC: 32.9 g/dL (ref 30.0–36.0)
MCV: 96.7 fL (ref 80.0–100.0)
Monocytes Absolute: 0.6 10*3/uL (ref 0.1–1.0)
Monocytes Relative: 8 %
Neutro Abs: 4.4 10*3/uL (ref 1.7–7.7)
Neutrophils Relative %: 62 %
PLATELETS: 187 10*3/uL (ref 150–400)
RBC: 4.18 MIL/uL — ABNORMAL LOW (ref 4.22–5.81)
RDW: 14.3 % (ref 11.5–15.5)
WBC: 7.1 10*3/uL (ref 4.0–10.5)
nRBC: 0 % (ref 0.0–0.2)

## 2018-01-11 LAB — COMPREHENSIVE METABOLIC PANEL
ALT: 17 U/L (ref 0–44)
ANION GAP: 9 (ref 5–15)
AST: 20 U/L (ref 15–41)
Albumin: 4.1 g/dL (ref 3.5–5.0)
Alkaline Phosphatase: 43 U/L (ref 38–126)
BUN: 15 mg/dL (ref 8–23)
CO2: 27 mmol/L (ref 22–32)
Calcium: 9 mg/dL (ref 8.9–10.3)
Chloride: 103 mmol/L (ref 98–111)
Creatinine, Ser: 0.83 mg/dL (ref 0.61–1.24)
GFR calc Af Amer: >60 mL/min (ref >60)
GFR calc non Af Amer: >60 mL/min (ref >60)
Glucose, Bld: 130 mg/dL — ABNORMAL HIGH (ref 70–99)
POTASSIUM: 3.7 mmol/L (ref 3.5–5.1)
Sodium: 139 mmol/L (ref 135–145)
Total Bilirubin: 0.3 mg/dL (ref 0.3–1.2)
Total Protein: 7 g/dL (ref 6.5–8.1)

## 2018-01-11 LAB — PSA: Prostatic Specific Antigen: 24.29 ng/mL — ABNORMAL HIGH (ref 0.00–4.00)

## 2018-01-13 NOTE — Telephone Encounter (Signed)
Acceptable risk for procedure Would stay on aspirin Could stop Plavix 5 days before if dentist requires Restart Plavix following the procedure

## 2018-01-14 ENCOUNTER — Other Ambulatory Visit: Payer: Self-pay | Admitting: Nurse Practitioner

## 2018-01-14 DIAGNOSIS — G893 Neoplasm related pain (acute) (chronic): Secondary | ICD-10-CM

## 2018-01-14 DIAGNOSIS — C61 Malignant neoplasm of prostate: Secondary | ICD-10-CM

## 2018-01-14 DIAGNOSIS — C7951 Secondary malignant neoplasm of bone: Principal | ICD-10-CM

## 2018-01-14 MED ORDER — HYDROCODONE-ACETAMINOPHEN 10-325 MG PO TABS: 1.0000 | ORAL_TABLET | Freq: Three times a day (TID) | ORAL | 0 refills | Status: DC | PRN

## 2018-01-14 MED ORDER — FENTANYL 25 MCG/HR TD PT72: 25.0000 ug | MEDICATED_PATCH | TRANSDERMAL | 0 refills | Status: DC

## 2018-01-14 MED ORDER — FENTANYL 100 MCG/HR TD PT72: 100.0000 ug | MEDICATED_PATCH | TRANSDERMAL | 0 refills | Status: DC

## 2018-01-14 NOTE — Telephone Encounter (Signed)
Copy of phone encounter faxed to [facility] with Dr. Donivan Scull recommendations.   Faxed to [phone]. Confirmation received.

## 2018-01-14 NOTE — Telephone Encounter (Signed)
Narcotic refill request: Fentanyl & Norco  As mandated by the [place] STOP Act (Strengthen Opioid Misuse Prevention), the [facility] reviewed prior to consideration of refills as below:     Given a current oncology diagnosis, this patient has the potential to experience significant cancer related pain. Benefits versus risks associated with continued therapy considered. Will continue pain management with opioids as previously prescribed.   Patient educated that medications should not be bitten, chewed, or crushed. Additionally, safety precautions reviewed. Patient verbalized understanding that medications should not be sold or shared, taken with alcohol, or used while driving. He has been made aware of the side effects of using this medication. Patient understands that this medication can cause CNS depression, increase his risk of falls, and even lead to overdose that may result in death, if used outside of the parameters that he and I discussed.   With all of this in mind, he accepts the risks and responsibilities associated with therapy and elects to continue to use the prescribed interventions. As supervising physician, Dr. Rogue Bussing, agrees that continuation of opioid therapy is medically appropriate at this time and agrees to provide continual monitoring, including urine/blood drug screens, as indicated.   Refill prescription sent electronically using [employer] secure transmission to requested pharmacy:  1. Fentanyl 125 mcg/hr  2. Norco 10-325 mg tablets  Beckey Rutter, DNP, AGNP-C [facility] (work cell) [phone] (office)

## 2018-01-15 ENCOUNTER — Other Ambulatory Visit: Payer: Self-pay

## 2018-01-15 ENCOUNTER — Encounter: Payer: Self-pay | Admitting: *Deleted

## 2018-01-15 ENCOUNTER — Inpatient Hospital Stay (HOSPITAL_BASED_OUTPATIENT_CLINIC_OR_DEPARTMENT_OTHER): Payer: 59 | Admitting: Internal Medicine

## 2018-01-15 VITALS — BP 173/88 | Temp 98.4°F | Resp 20

## 2018-01-15 DIAGNOSIS — F17210 Nicotine dependence, cigarettes, uncomplicated: Secondary | ICD-10-CM

## 2018-01-15 DIAGNOSIS — Z79899 Other long term (current) drug therapy: Secondary | ICD-10-CM

## 2018-01-15 DIAGNOSIS — C61 Malignant neoplasm of prostate: Secondary | ICD-10-CM | POA: Diagnosis not present

## 2018-01-15 DIAGNOSIS — I10 Essential (primary) hypertension: Secondary | ICD-10-CM | POA: Diagnosis not present

## 2018-01-15 DIAGNOSIS — Z7982 Long term (current) use of aspirin: Secondary | ICD-10-CM

## 2018-01-15 DIAGNOSIS — K047 Periapical abscess without sinus: Secondary | ICD-10-CM | POA: Diagnosis not present

## 2018-01-15 DIAGNOSIS — Z9079 Acquired absence of other genital organ(s): Secondary | ICD-10-CM | POA: Diagnosis not present

## 2018-01-15 DIAGNOSIS — C7951 Secondary malignant neoplasm of bone: Secondary | ICD-10-CM

## 2018-01-15 NOTE — Progress Notes (Signed)
[place] OFFICE PROGRESS NOTE  Patient Care Team: Olin Hauser, [name], [name], [name]  Cancer Staging No matching staging information was found for the patient.   Oncology History   # 2011- PROSTATE CANCER [Gleason 3+4]; s/p Prostatectomy [ also involved bladder neck/ECP; Dr.Polaseck]; July 2014- Biochemical recurrence [PSA 14]- started on Zoladex [Dr.Pandit]; lost to follow up.  # JAN 2017- STAGE IV METASTATIC PROSTATE Cancer to Bone- Feb 13th, 2017-  Lupron q 68m [~end of feb]; PSA: 1021; Declined Chemo; April 2017 [xofigo x6; Dr.Crystal]; AUG 2017- Zytiga + Prednisone BID. Bone scan-Jan 2018- improved skeletal metastases.   # MAY 1st 2019- START X-tandi [stopped zytiga/PSA- 8.8/rising]  # Mets to bone- start X-geva q 77M [May 30th]  # Smoker/ chronic pain/pain clinic   #[April 2018; [place]]- s/p stenting; on Plavix; --------------------------------------------------------------  DIAGNOSIS: [ ]  Castrate resistant prostate cancer  STAGE:   4    ;GOALS: Palliative  CURRENT/MOST RECENT THERAPY [April 2019]-Xtandi      Prostate cancer metastatic to bone ([place])   12/08/2014 Initial Diagnosis    Prostate cancer metastatic to bone [facility])       INTERVAL HISTORY:  Robert Short 68 y.o.  male pleasant patient above history of metastatic castrate resistant prostate cancer currently on Xtandi is here for follow-up.  Patient states her shingles has improved.  Is currently off acyclovir.  Patient misunderstood my instructions at last visit and stopped taking his Xtandi.  He states his fatigue is fairly controlled.  He pain is stable.  He continues to be on narcotic pain prescriptions.  He continues to have difficulty with his teeth recent eval by dentistry.  Review of Systems  Constitutional: Positive for  malaise/fatigue and weight loss. Negative for chills, diaphoresis and fever.  HENT: Negative for nosebleeds and sore throat.   Eyes: Negative for double vision.  Respiratory: Positive for shortness of breath. Negative for cough, hemoptysis, sputum production and wheezing.   Cardiovascular: Negative for chest pain, palpitations, orthopnea and leg swelling.  Gastrointestinal: Positive for constipation. Negative for abdominal pain, blood in stool, diarrhea, heartburn, melena, nausea and vomiting.  Genitourinary: Negative for dysuria, frequency and urgency.  Musculoskeletal: Positive for back pain and joint pain.  Skin: Negative.  Negative for itching and rash.  Neurological: Negative for dizziness, tingling, focal weakness, weakness and headaches.  Endo/Heme/Allergies: Does not bruise/bleed easily.  Psychiatric/Behavioral: Negative for depression. The patient is not nervous/anxious and does not have insomnia.       PAST MEDICAL HISTORY :  Past Medical History:  Diagnosis Date  . Back pain 10/09/2012  . Bone cancer ([place])   . CAD (coronary artery disease)    a. 08/2015 PCI: LM nl, LAD 20d, LCX nl, RCA 69m, RPAV 95 (3.0x18 Xience Alpine DES);  b. 04/2016 PCI [facility]): RPL 95 (2.75x8 Promus Premier DES).  . Cancer associated pain   . Depression   . History of echocardiogram    a. 08/2016 Echo: EF 50-55%.  Marland Kitchen History of kidney stones   . Hyperlipidemia   . Hypertension   . Joint pain   . Prostate cancer [facility])    a.  s/p prostatectomy ([facility]);  b. bone mets noted 04/2016.  . Right arm pain 01/10/2016  . Right foot pain 01/10/2016  . Right leg pain 01/10/2016  . Tobacco abuse     PAST SURGICAL HISTORY :  Past Surgical History:  Procedure Laterality Date  . CARDIAC CATHETERIZATION     [facility]  . CARDIAC CATHETERIZATION N/A 08/16/2015   Procedure: Left Heart Cath and Coronary Angiography;  Surgeon: Yolonda Kida, MD;  Location: [place] CV LAB;  Service: Cardiovascular;  Laterality:  N/A;  . CARDIAC CATHETERIZATION N/A 08/16/2015   Procedure: Coronary Stent Intervention;  Surgeon: Yolonda Kida, MD;  Location: [place] CV LAB;  Service: Cardiovascular;  Laterality: N/A;  . CERVIX SURGERY    . PROSTATECTOMY    . SPINE SURGERY    . TONSILLECTOMY    . WRIST SURGERY      FAMILY HISTORY :   Family History  Problem Relation Age of Onset  . Heart attack Mother   . Hypertension Mother   . Heart attack Father     SOCIAL HISTORY:   Social History   Tobacco Use  . Smoking status: Current Every Day Smoker    Packs/day: 1.00    Years: 40.00    Pack years: 40.00    Types: Cigarettes  . Smokeless tobacco: Current User  Substance Use Topics  . Alcohol use: Yes    Frequency: Never    Comment: occasionally  . Drug use: No    ALLERGIES:  is allergic to ditropan [oxybutynin].  MEDICATIONS:  Current Outpatient Medications  Medication Sig Dispense Refill  . acetaminophen (TYLENOL) 500 MG tablet Take 1,000 mg by mouth every 8 (eight) hours as needed.     Marland Kitchen albuterol (PROVENTIL HFA;VENTOLIN HFA) 108 (90 Base) MCG/ACT inhaler Inhale 1-2 puffs into the lungs every 6 (six) hours as needed for wheezing or shortness of breath. 1 Inhaler 0  . amoxicillin-clavulanate (AUGMENTIN) 875-125 MG tablet Take 1 tablet by mouth 2 (two) times daily. 14 tablet 0  . aspirin 81 MG tablet Take 81 mg by mouth daily.    Marland Kitchen atorvastatin (LIPITOR) 80 MG tablet TAKE 1 TABLET (80 MG TOTAL) BY MOUTH DAILY AT 6 PM. 90 tablet 1  . clopidogrel (PLAVIX) 75 MG tablet TAKE 1 TABLET (75 MG TOTAL) BY MOUTH DAILY. 90 tablet 2  . ezetimibe (ZETIA) 10 MG tablet Take 1 tablet (10 mg total) by mouth daily. 90 tablet 3  . fentaNYL (DURAGESIC - DOSED MCG/HR) 100 MCG/HR Place 1 patch (100 mcg total) onto the skin every 3 (three) days. Along with 34mcg patch every 3 days. 10 patch 0  . fentaNYL (DURAGESIC - DOSED MCG/HR) 25 MCG/HR patch Place 1 patch (25 mcg total) onto the skin every 3 (three) days. Use this  along with fentanyl patch 100 mcg [total 125 mcg] every 3 days 10 patch 0  . gabapentin (NEURONTIN) 100 MG capsule START 1 CAPSULE DAILY, INCREASE BY 1 CAP EVERY 2-3 DAYS AS TOLERATED UP TO 3 TIMES A DAY, OR MAY TAKE 3 AT ONCE IN [place]. 90 capsule 5  . HYDROcodone-acetaminophen (NORCO) 10-325 MG tablet Take 1 tablet by mouth every 8 (eight) hours as needed. 90 tablet 0  . metoprolol tartrate (LOPRESSOR) 25 MG tablet Take 0.5 tablets (12.5 mg total) by mouth 2 (two) times daily. 90 tablet 1  . nitroGLYCERIN (NITROSTAT) 0.4 MG SL tablet Place 0.4 mg under the tongue every 5 (five) minutes as needed.     . polyethylene glycol (MIRALAX / GLYCOLAX) packet Take 17 g by mouth daily as needed for moderate constipation.    . predniSONE (DELTASONE) 10 MG tablet Take 1 tablet (10 mg total) by mouth daily with breakfast. 30 tablet 3  .  promethazine (PHENERGAN) 12.5 MG tablet Take 1-2 tablets (12.5-25 mg total) by mouth every 8 (eight) hours as needed for nausea or vomiting. 60 tablet 3  . valACYclovir (VALTREX) 1000 MG tablet Take 1 tablet (1,000 mg total) by mouth 3 (three) times daily. For shingles, for 10 days 30 tablet 0  . Wheat Dextrin (BENEFIBER) POWD Stir 2 tsp. TID into 4-8 oz of any non-carbonated beverage or soft food (hot or cold) 500 g PRN  . XTANDI 40 MG capsule TAKE 4 CAPSULES (160 MG TOTAL) BY MOUTH DAILY. 120 capsule 3   No current facility-administered medications for this visit.     PHYSICAL EXAMINATION: ECOG PERFORMANCE STATUS: 1 - Symptomatic but completely ambulatory  BP (!) 173/88   Temp 98.4 F (36.9 C) (Oral)   Resp 20   There were no vitals filed for this visit.  Physical Exam  Constitutional: He is oriented to person, place, and time.   Frail-appearing Caucasian male patient.  He is walking with a cane.  Accompanied by his wife.  HENT:  Head: Normocephalic and atraumatic.  Mouth/Throat: Oropharynx is clear and moist. No oropharyngeal exudate.  Poor dentition.  Eyes:  Pupils are equal, round, and reactive to light.  Neck: Normal range of motion. Neck supple.  Mild swelling under the chin tender.  Soft.  Cardiovascular: Normal rate and regular rhythm.  Pulmonary/Chest: No respiratory distress. He has no wheezes.  Decreased breath sounds bilaterally at the bases.  No wheeze or crackles  Abdominal: Soft. Bowel sounds are normal. He exhibits no distension and no mass. There is no abdominal tenderness. There is no rebound and no guarding.  Musculoskeletal: Normal range of motion.        General: No tenderness or edema.  Neurological: He is alert and oriented to person, place, and time.  Skin: Skin is warm.  Psychiatric: Affect normal.       LABORATORY DATA:  I have reviewed the data as listed    Component Value Date/Time   NA 139 01/11/2018 1148   NA 140 07/13/2013 1542   K 3.7 01/11/2018 1148   K 4.3 07/13/2013 1542   CL 103 01/11/2018 1148   CL 107 07/13/2013 1542   CO2 27 01/11/2018 1148   CO2 26 07/13/2013 1542   GLUCOSE 130 (H) 01/11/2018 1148   GLUCOSE 106 (H) 07/13/2013 1542   BUN 15 01/11/2018 1148   BUN 11 07/13/2013 1542   CREATININE 0.83 01/11/2018 1148   CREATININE 0.76 10/15/2017 1001   CALCIUM 9.0 01/11/2018 1148   CALCIUM 9.9 07/13/2013 1542   PROT 7.0 01/11/2018 1148   PROT 8.1 07/13/2013 1542   ALBUMIN 4.1 01/11/2018 1148   ALBUMIN 4.3 07/13/2013 1542   AST 20 01/11/2018 1148   AST 26 07/13/2013 1542   ALT 17 01/11/2018 1148   ALT 32 07/13/2013 1542   ALKPHOS 43 01/11/2018 1148   ALKPHOS 74 07/13/2013 1542   BILITOT 0.3 01/11/2018 1148   BILITOT 0.4 07/13/2013 1542   GFRNONAA >60 01/11/2018 1148   GFRNONAA 95 10/15/2017 1001   GFRAA >60 01/11/2018 1148   GFRAA 110 10/15/2017 1001    No results found for: SPEP, UPEP  Lab Results  Component Value Date   WBC 7.1 01/11/2018   NEUTROABS 4.4 01/11/2018   HGB 13.3 01/11/2018   HCT 40.4 01/11/2018   MCV 96.7 01/11/2018   PLT 187 01/11/2018      Chemistry       Component Value Date/Time   NA  139 01/11/2018 1148   NA 140 07/13/2013 1542   K 3.7 01/11/2018 1148   K 4.3 07/13/2013 1542   CL 103 01/11/2018 1148   CL 107 07/13/2013 1542   CO2 27 01/11/2018 1148   CO2 26 07/13/2013 1542   BUN 15 01/11/2018 1148   BUN 11 07/13/2013 1542   CREATININE 0.83 01/11/2018 1148   CREATININE 0.76 10/15/2017 1001      Component Value Date/Time   CALCIUM 9.0 01/11/2018 1148   CALCIUM 9.9 07/13/2013 1542   ALKPHOS 43 01/11/2018 1148   ALKPHOS 74 07/13/2013 1542   AST 20 01/11/2018 1148   AST 26 07/13/2013 1542   ALT 17 01/11/2018 1148   ALT 32 07/13/2013 1542   BILITOT 0.3 01/11/2018 1148   BILITOT 0.4 07/13/2013 1542       RADIOGRAPHIC STUDIES: I have personally reviewed the radiological images as listed and agreed with the findings in the report. No results found.   ASSESSMENT & PLAN:  Prostate cancer metastatic to bone ([place]) # Castrate resistant prostate cancer metastases to bone. Lupron every 3 months [11/06/17].  January 2020 PSA 24/rising worsening.  #Recommend restarting Xtandi 3 pills a day [patient states that he misunderstood my recommendations last visit.]  #Poor dentition-dental abscess on antibiotics/awaiting dental procedure.  I spoke to Dr. Jessie Foot; who kindly agrees to evaluate the patient.  Patient is cleared to proceed with any dental procedure needed as his last Delton See was on September 25, 2017.  #Bone mets-hold Xgeva; last given on September 25, 2017-awaiting about dental procedure.  # Malignancy related pain-stable continue fentanyl patch hydrocodone.  # DISPOSITION: # follow up in 6 weeks- MD- /cbc/cmpPSA; Lupron-Dr.B.    No orders of the defined types were placed in this encounter.  All questions were answered. The patient knows to call the clinic with any problems, questions or concerns.      [name], MD 01/15/2018 3:51 PM

## 2018-01-15 NOTE — Assessment & Plan Note (Addendum)
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 Lupron every 3 months [11/06/17].  January 2020 PSA 24/rising worsening.  #Recommend restarting Xtandi 3 pills a day [patient states that he misunderstood my recommendations last visit.]  #Poor dentition-dental abscess on antibiotics/awaiting dental procedure.  I spoke to Dr. Jessie Foot; who kindly agrees to evaluate the patient.  Patient is cleared to proceed with any dental procedure needed as his last Delton See was on September 25, 2017.  #Bone mets-hold Xgeva; last given on September 25, 2017-awaiting about dental procedure.  # Malignancy related pain-stable continue fentanyl patch hydrocodone.  # DISPOSITION: # follow up in 6 weeks- MD- /cbc/cmpPSA; Lupron-Dr.B.

## 2018-02-05 ENCOUNTER — Ambulatory Visit: Payer: Self-pay

## 2018-02-07 ENCOUNTER — Other Ambulatory Visit: Payer: Self-pay | Admitting: Nurse Practitioner

## 2018-02-07 DIAGNOSIS — C7951 Secondary malignant neoplasm of bone: Principal | ICD-10-CM

## 2018-02-07 DIAGNOSIS — C61 Malignant neoplasm of prostate: Secondary | ICD-10-CM

## 2018-02-07 DIAGNOSIS — G893 Neoplasm related pain (acute) (chronic): Secondary | ICD-10-CM

## 2018-02-08 MED ORDER — FENTANYL 100 MCG/HR TD PT72: 1.0000 | MEDICATED_PATCH | TRANSDERMAL | 0 refills | Status: DC

## 2018-02-08 MED ORDER — FENTANYL 25 MCG/HR TD PT72: 1.0000 | MEDICATED_PATCH | TRANSDERMAL | 0 refills | Status: DC

## 2018-02-08 MED ORDER — HYDROCODONE-ACETAMINOPHEN 10-325 MG PO TABS: 1.0000 | ORAL_TABLET | Freq: Three times a day (TID) | ORAL | 0 refills | Status: DC | PRN

## 2018-02-08 NOTE — Telephone Encounter (Signed)
Narcotic refill request: Fentanyl & Norco  As mandated by the [place] STOP Act (Strenghtenn Opioid Misuse Prevention), the [employer] reviewed prior to consideration of refills. Refill requested early and will not be available for fill until 02/14/2018.    Given a current oncology diagnosis, this patient has the potential to experience significant cancer related pain. Benefits versus risks associated with continued therapy considered. Will continue pain management with opioids as previously prescribed.   Patient educated that medications should not be bitten, chewed, or crushed. Additionally, safety precautions reviewed. Patient verbalized understanding that medications should not be sold or shared, taken with alcohol, or used while driving. He has been made aware of the side effects of using this medication. Patient understands that this medication can cause CNS depression, increase his risk of falls, and even lead to overdose that may result in death, if used outside of the parameters that he and I discussed.   With all of this in mind, he accepts the risks and responsibilities associated with therapy and elects to continue to use the prescribed interventions. As supervising physician, Dr. Rogue Bussing, agrees that continuation of opioid therapy is medically appropriate at this time and agrees to provide continual monitoring, including urine/blood drug screens, as indicated.   Refill prescription sent electronically using [employer] secure transmission to requested pharmacy:  1. [facility] 2. [place], AGNP-C [place] at [facility] [phone] (work cell) [phone] (office)

## 2018-02-14 ENCOUNTER — Other Ambulatory Visit: Payer: Self-pay | Admitting: Internal Medicine

## 2018-02-18 ENCOUNTER — Telehealth: Payer: Self-pay | Admitting: Pharmacy Technician

## 2018-02-18 ENCOUNTER — Other Ambulatory Visit: Payer: Self-pay | Admitting: Internal Medicine

## 2018-02-18 ENCOUNTER — Other Ambulatory Visit: Payer: Self-pay | Admitting: *Deleted

## 2018-02-18 DIAGNOSIS — C7951 Secondary malignant neoplasm of bone: Principal | ICD-10-CM

## 2018-02-18 DIAGNOSIS — C61 Malignant neoplasm of prostate: Secondary | ICD-10-CM

## 2018-02-18 MED FILL — XTANDI 40 MG CAPSULE: 40 | 28 days supply | Qty: 84 | Fill #0

## 2018-02-18 NOTE — Telephone Encounter (Signed)
Oral Oncology Patient Advocate Encounter    Spoke to patient to see if he was ready for a refill of Xtandi.  Patient held medication while awaiting dental surgery.  I spoke with the patient and he said he needed to go ahead and get a refill but didn't know how many he had on hand.  I sent for a refill request.    Dennison Nancy [place] Patient [place] Phone [phone] Fax [phone] 02/18/2018 11:44 AM

## 2018-02-19 NOTE — Telephone Encounter (Signed)
Prescription has been filled and will be couriered to [place] for patient to pick up on Wednesday.

## 2018-02-22 ENCOUNTER — Inpatient Hospital Stay: Payer: 59 | Attending: Hematology and Oncology

## 2018-02-22 ENCOUNTER — Other Ambulatory Visit: Payer: Self-pay | Admitting: Cardiovascular Disease

## 2018-02-22 DIAGNOSIS — Z79818 Long term (current) use of other agents affecting estrogen receptors and estrogen levels: Secondary | ICD-10-CM | POA: Diagnosis not present

## 2018-02-22 DIAGNOSIS — G893 Neoplasm related pain (acute) (chronic): Secondary | ICD-10-CM | POA: Insufficient documentation

## 2018-02-22 DIAGNOSIS — C61 Malignant neoplasm of prostate: Secondary | ICD-10-CM | POA: Insufficient documentation

## 2018-02-22 DIAGNOSIS — C7951 Secondary malignant neoplasm of bone: Secondary | ICD-10-CM | POA: Insufficient documentation

## 2018-02-22 LAB — CBC WITH DIFFERENTIAL/PLATELET
ABS IMMATURE GRANULOCYTES: 0.06 10*3/uL (ref 0.00–0.07)
Basophils Absolute: 0 10*3/uL (ref 0.0–0.1)
Basophils Relative: 1 %
Eosinophils Absolute: 0.1 10*3/uL (ref 0.0–0.5)
Eosinophils Relative: 1 %
HCT: 38 % — ABNORMAL LOW (ref 39.0–52.0)
Hemoglobin: 12.5 g/dL — ABNORMAL LOW (ref 13.0–17.0)
Immature Granulocytes: 1 %
Lymphocytes Relative: 18 %
Lymphs Abs: 1.5 10*3/uL (ref 0.7–4.0)
MCH: 32 pg (ref 26.0–34.0)
MCHC: 32.9 g/dL (ref 30.0–36.0)
MCV: 97.2 fL (ref 80.0–100.0)
Monocytes Absolute: 0.8 10*3/uL (ref 0.1–1.0)
Monocytes Relative: 10 %
Neutro Abs: 5.9 10*3/uL (ref 1.7–7.7)
Neutrophils Relative %: 69 %
Platelets: 205 10*3/uL (ref 150–400)
RBC: 3.91 MIL/uL — ABNORMAL LOW (ref 4.22–5.81)
RDW: 13.8 % (ref 11.5–15.5)
WBC: 8.5 10*3/uL (ref 4.0–10.5)
nRBC: 0 % (ref 0.0–0.2)

## 2018-02-22 LAB — COMPREHENSIVE METABOLIC PANEL
ALK PHOS: 45 U/L (ref 38–126)
ALT: 10 U/L (ref 0–44)
AST: 15 U/L (ref 15–41)
Albumin: 4 g/dL (ref 3.5–5.0)
Anion gap: 8 (ref 5–15)
BILIRUBIN TOTAL: 0.4 mg/dL (ref 0.3–1.2)
BUN: 14 mg/dL (ref 8–23)
CALCIUM: 9.7 mg/dL (ref 8.9–10.3)
CO2: 27 mmol/L (ref 22–32)
Chloride: 102 mmol/L (ref 98–111)
Creatinine, Ser: 0.67 mg/dL (ref 0.61–1.24)
GFR calc Af Amer: >60 mL/min (ref >60)
GFR calc non Af Amer: >60 mL/min (ref >60)
Glucose, Bld: 136 mg/dL — ABNORMAL HIGH (ref 70–99)
Potassium: 4 mmol/L (ref 3.5–5.1)
Sodium: 137 mmol/L (ref 135–145)
TOTAL PROTEIN: 6.5 g/dL (ref 6.5–8.1)

## 2018-02-22 NOTE — Telephone Encounter (Signed)
Checked with [facility] pharmacy to make sure patient picked up his medication.  Xtandi had not been picked up by patient.  I called his home number and it rang until it said the call couldn't be completed.  I called the cell phone number and it does not have voicemail set up.  Patient does have appointment on Monday with Dr B.  I will try to get by to remind him to pick up his medication.

## 2018-02-22 NOTE — Telephone Encounter (Signed)
Spoke to wife. They will try to get by today to pick up prescription.

## 2018-02-23 LAB — PSA: Prostatic Specific Antigen: 32.47 ng/mL — ABNORMAL HIGH (ref 0.00–4.00)

## 2018-02-25 ENCOUNTER — Inpatient Hospital Stay (HOSPITAL_BASED_OUTPATIENT_CLINIC_OR_DEPARTMENT_OTHER): Payer: 59 | Admitting: Internal Medicine

## 2018-02-25 ENCOUNTER — Encounter: Payer: Self-pay | Admitting: Internal Medicine

## 2018-02-25 ENCOUNTER — Inpatient Hospital Stay (HOSPITAL_BASED_OUTPATIENT_CLINIC_OR_DEPARTMENT_OTHER): Payer: 59 | Admitting: Hospice and Palliative Medicine

## 2018-02-25 ENCOUNTER — Inpatient Hospital Stay: Payer: 59

## 2018-02-25 VITALS — BP 162/89 | HR 75 | Temp 98.0°F | Resp 16 | Wt 180.0 lb

## 2018-02-25 DIAGNOSIS — G893 Neoplasm related pain (acute) (chronic): Secondary | ICD-10-CM

## 2018-02-25 DIAGNOSIS — C7951 Secondary malignant neoplasm of bone: Secondary | ICD-10-CM | POA: Diagnosis not present

## 2018-02-25 DIAGNOSIS — Z515 Encounter for palliative care: Secondary | ICD-10-CM | POA: Diagnosis not present

## 2018-02-25 DIAGNOSIS — C61 Malignant neoplasm of prostate: Secondary | ICD-10-CM

## 2018-02-25 DIAGNOSIS — Z79818 Long term (current) use of other agents affecting estrogen receptors and estrogen levels: Secondary | ICD-10-CM

## 2018-02-25 DIAGNOSIS — Z7189 Other specified counseling: Secondary | ICD-10-CM

## 2018-02-25 MED ORDER — NALOXONE HCL 4 MG/0.1ML NA LIQD: NASAL | 0 refills | Status: DC

## 2018-02-25 MED ORDER — LEUPROLIDE ACETATE (3 MONTH) 22.5 MG IM KIT
22.5000 mg | PACK | Freq: Once | INTRAMUSCULAR | Status: AC
  Administered 2018-02-25: 22.5 mg via INTRAMUSCULAR

## 2018-02-25 MED ORDER — FENTANYL 100 MCG/HR TD PT72: 1.0000 | MEDICATED_PATCH | TRANSDERMAL | 0 refills | Status: DC

## 2018-02-25 MED ORDER — FENTANYL 25 MCG/HR TD PT72: 1.0000 | MEDICATED_PATCH | TRANSDERMAL | 0 refills | Status: DC

## 2018-02-25 NOTE — Assessment & Plan Note (Addendum)
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 Lupron every 3 months [11/06/17]. FEB 2020 PSA 32/rising worsening. Currently taking 1 pill sec to dental issues.   #Recommend restarting Xtandi 3 pills a day [patient states that he misunderstood my recommendations last visit.]  Again discussed about chemotherapy patient wants to hold off chemotherapy as much as he can.  #Poor dentition-dental abscess on antibiotics/awaiting dental procedure.   #Bone mets-hold Xgeva; last given on September 25, 2017. HOLD X-geva for now.  # Malignancy related pain- continue fentanyl patch hydrocodone.  Not well controlled.  As per palliative care Josh; agree changing fentanyl patch to every 48 hours and continue hydrocodone.  # DISPOSITION: # Lupron today.  # follow up on 4/27 MD- /cbc/cmpPSA- labs 1 week prior-;Dr.B.

## 2018-02-25 NOTE — Progress Notes (Signed)
[facility] OFFICE PROGRESS NOTE  Patient Care Team: Olin Hauser, [name], [name], [name]  Cancer Staging No matching staging information was found for the patient.   Oncology History   # 2011- PROSTATE CANCER [Gleason 3+4]; s/p Prostatectomy [ also involved bladder neck/ECP; Dr.Polaseck]; July 2014- Biochemical recurrence [PSA 14]- started on Zoladex [Dr.Pandit]; lost to follow up.  # JAN 2017- STAGE IV METASTATIC PROSTATE Cancer to Bone- Feb 13th, 2017-  Lupron q 21m[~end of feb]; PSA: 1021; Declined Chemo; April 2017 [xofigo x6; Dr.Crystal]; AUG 2017- Zytiga + Prednisone BID. Bone scan-Jan 2018- improved skeletal metastases.   # MAY 1st 2019- START X-tandi [stopped zytiga/PSA- 8.8/rising]  # Mets to bone- start X-geva q 20M [May 30th]  # Smoker/ chronic pain/pain clinic   #[April 2018; [place] s/p stenting; on Plavix; --------------------------------------------------------------  DIAGNOSIS: '[ ]'  Castrate resistant prostate cancer  STAGE:   4    ;GOALS: Palliative  CURRENT/MOST RECENT THERAPY [April 2019]-Xtandi      [place]   12/08/2014 Initial Diagnosis    Prostate cancer metastatic to bone ([facility]       INTERVAL HISTORY:  Robert Short 68y.o.  male pleasant patient above history of metastatic castrate resistant prostate cancer currently on Xtandi is here for follow-up.  Patient has been taking only 1 Xtandi a day-because of recent issues with dental extraction difficulty swallowing.  Patient recently finished antibiotics for dental infection.  He continues to complain of pain back in the joints; he is currently taking fentanyl/hydrocodone.   Review of Systems  Constitutional: Positive for malaise/fatigue and weight loss. Negative for chills, diaphoresis and  fever.  HENT: Negative for nosebleeds and sore throat.   Eyes: Negative for double vision.  Respiratory: Positive for shortness of breath. Negative for cough, hemoptysis, sputum production and wheezing.   Cardiovascular: Negative for chest pain, palpitations, orthopnea and leg swelling.  Gastrointestinal: Positive for constipation. Negative for abdominal pain, blood in stool, diarrhea, heartburn, melena, nausea and vomiting.  Genitourinary: Negative for dysuria, frequency and urgency.  Musculoskeletal: Positive for back pain and joint pain.  Skin: Negative.  Negative for itching and rash.  Neurological: Negative for dizziness, tingling, focal weakness, weakness and headaches.  Endo/Heme/Allergies: Does not bruise/bleed easily.  Psychiatric/Behavioral: Negative for depression. The patient is not nervous/anxious and does not have insomnia.       PAST MEDICAL HISTORY :  Past Medical History:  Diagnosis Date  . Back pain 10/09/2012  . [place] cancer ([place]   . CAD (coronary artery disease)    a. 08/2015 PCI: LM nl, LAD 20d, LCX nl, RCA 245mRPAV 95 (3.0x18 Xience Alpine DES);  [facility] (2.75x8 Promus Premier DES).  . Cancer associated pain   . Depression   . History of echocardiogram    a. 08/2016 Echo: EF 50-55%.  . Marland Kitchenistory of kidney stones   . Hyperlipidemia   . Hypertension   . Joint pain   . [facility]   a.  s/p prostatectomy ([facility]);  b. bone mets noted 04/2016.  . Right arm pain 01/10/2016  . Right foot pain 01/10/2016  . Right leg pain 01/10/2016  . Tobacco abuse     PAST SURGICAL HISTORY :   Past Surgical History:  Procedure Laterality Date  . CARD[facility]AC CATHETERIZATION  [facility]  . CARDIAC CATHETERIZATION N/A 08/16/2015   Procedure: Left Heart Cath and Coronary Angiography;  Surgeon: Yolonda Kida, MD;  Location: [place] CV LAB;  Service: Cardiovascular;  Laterality: N/A;  . CARDIAC CATHETERIZATION N/A 08/16/2015   Procedure: Coronary  Stent Intervention;  Surgeon: Yolonda Kida, MD;  Location: [place] CV LAB;  Service: Cardiovascular;  Laterality: N/A;  . CERVIX SURGERY    . PROSTATECTOMY    . SPINE SURGERY    . TONSILLECTOMY    . WRIST SURGERY      FAMILY HISTORY :   Family History  Problem Relation Age of Onset  . Heart attack Mother   . Hypertension Mother   . Heart attack Father     SOCIAL HISTORY:   Social History   Tobacco Use  . Smoking status: Current Every Day Smoker    Packs/day: 1.00    Years: 40.00    Pack years: 40.00    Types: Cigarettes  . Smokeless tobacco: Current User  Substance Use Topics  . Alcohol use: Yes    Frequency: Never    Comment: occasionally  . Drug use: No    ALLERGIES:  is allergic to ditropan [oxybutynin].  MEDICATIONS:  Current Outpatient Medications  Medication Sig Dispense Refill  . acetaminophen (TYLENOL) 500 MG tablet Take 1,000 mg by mouth every 8 (eight) hours as needed.     Marland Kitchen albuterol (PROVENTIL HFA;VENTOLIN HFA) 108 (90 Base) MCG/ACT inhaler Inhale 1-2 puffs into the lungs every 6 (six) hours as needed for wheezing or shortness of breath. 1 Inhaler 0  . amoxicillin-clavulanate (AUGMENTIN) 875-125 MG tablet Take 1 tablet by mouth 2 (two) times daily. 14 tablet 0  . aspirin 81 MG tablet Take 81 mg by mouth daily.    Marland Kitchen atorvastatin (LIPITOR) 80 MG tablet TAKE 1 TABLET (80 MG TOTAL) BY MOUTH DAILY AT 6 PM. 90 tablet 1  . clopidogrel (PLAVIX) 75 MG tablet TAKE 1 TABLET BY MOUTH DAILY 90 tablet 0  . enzalutamide (XTANDI) 40 MG capsule Take 3 capsules (120 mg total) by mouth daily. 84 capsule 3  . ezetimibe (ZETIA) 10 MG tablet Take 1 tablet (10 mg total) by mouth daily. 90 tablet 3  . gabapentin (NEURONTIN) 100 MG capsule START 1 CAPSULE DAILY, INCREASE BY 1 CAP EVERY 2-3 DAYS AS TOLERATED UP TO 3 TIMES A DAY, OR MAY TAKE 3 AT ONCE IN [place]. 90 capsule 5  . HYDROcodone-acetaminophen (NORCO) 10-325 MG tablet Take 1 tablet by mouth every 8 (eight) hours  as needed. 90 tablet 0  . metoprolol tartrate (LOPRESSOR) 25 MG tablet Take 0.5 tablets (12.5 mg total) by mouth 2 (two) times daily. 90 tablet 1  . nitroGLYCERIN (NITROSTAT) 0.4 MG SL tablet Place 0.4 mg under the tongue every 5 (five) minutes as needed.     . polyethylene glycol (MIRALAX / GLYCOLAX) packet Take 17 g by mouth daily as needed for moderate constipation.    . predniSONE (DELTASONE) 10 MG tablet TAKE 1 TABLET BY MOUTH DAILY WITH BREAKFAST. 30 tablet 2  . promethazine (PHENERGAN) 12.5 MG tablet Take 1-2 tablets (12.5-25 mg total) by mouth every 8 (eight) hours as needed for nausea or vomiting. 60 tablet 3  . valACYclovir (VALTREX) 1000 MG tablet Take 1 tablet (1,000 mg total) by mouth 3 (three) times daily. For shingles, for 10 days 30 tablet 0  . Wheat Dextrin (BENEFIBER) POWD Stir 2 tsp. TID into 4-8 oz of any non-carbonated beverage or soft food (  hot or cold) 500 g PRN  . fentaNYL (DURAGESIC) 100 MCG/HR Place 1 patch onto the skin every other day. Along with 59mg patch for total of 125 mcg every 2 days. 10 patch 0  . fentaNYL (DURAGESIC) 25 MCG/HR Place 1 patch onto the skin every other day. Use this along with fentanyl patch 100 mcg [total 125 mcg] every 2 days 10 patch 0  . naloxone (NARCAN) nasal spray 4 mg/0.1 mL 1 spray into nostril upon signs of opioid overdose. Call 911. May repeat once if no response within 2-3 minutes. 1 kit 0   No current facility-administered medications for this visit.     PHYSICAL EXAMINATION: ECOG PERFORMANCE STATUS: 1 - Symptomatic but completely ambulatory  BP (!) 162/89 (BP Location: Left Arm, Patient Position: Sitting, Cuff Size: Normal)   Pulse 75   Temp 98 F (36.7 C) (Tympanic)   Resp 16   Wt 180 lb (81.6 kg)   BMI 25.10 kg/m   Filed Weights   02/25/18 1400  Weight: 180 lb (81.6 kg)    Physical Exam  Constitutional: He is oriented to person, place, and time.   Frail-appearing Caucasian male patient.  He is walking with a cane.   Accompanied by his wife.  HENT:  Head: Normocephalic and atraumatic.  Mouth/Throat: Oropharynx is clear and moist. No oropharyngeal exudate.  Poor dentition.  Eyes: Pupils are equal, round, and reactive to light.  Neck: Normal range of motion. Neck supple.  Mild swelling under the chin tender.  Soft.  Cardiovascular: Normal rate and regular rhythm.  Pulmonary/Chest: No respiratory distress. He has no wheezes.  Decreased breath sounds bilaterally at the bases.  No wheeze or crackles  Abdominal: Soft. Bowel sounds are normal. He exhibits no distension and no mass. There is no abdominal tenderness. There is no rebound and no guarding.  Musculoskeletal: Normal range of motion.        General: No tenderness or edema.  Neurological: He is alert and oriented to person, place, and time.  Skin: Skin is warm.  Psychiatric: Affect normal.       LABORATORY DATA:  I have reviewed the data as listed    Component Value Date/Time   NA 137 02/22/2018 1128   NA 140 07/13/2013 1542   K 4.0 02/22/2018 1128   K 4.3 07/13/2013 1542   CL 102 02/22/2018 1128   CL 107 07/13/2013 1542   CO2 27 02/22/2018 1128   CO2 26 07/13/2013 1542   GLUCOSE 136 (H) 02/22/2018 1128   GLUCOSE 106 (H) 07/13/2013 1542   BUN 14 02/22/2018 1128   BUN 11 07/13/2013 1542   CREATININE 0.67 02/22/2018 1128   CREATININE 0.76 10/15/2017 1001   CALCIUM 9.7 02/22/2018 1128   CALCIUM 9.9 07/13/2013 1542   PROT 6.5 02/22/2018 1128   PROT 8.1 07/13/2013 1542   ALBUMIN 4.0 02/22/2018 1128   ALBUMIN 4.3 07/13/2013 1542   AST 15 02/22/2018 1128   AST 26 07/13/2013 1542   ALT 10 02/22/2018 1128   ALT 32 07/13/2013 1542   ALKPHOS 45 02/22/2018 1128   ALKPHOS 74 07/13/2013 1542   BILITOT 0.4 02/22/2018 1128   BILITOT 0.4 07/13/2013 1542   GFRNONAA >60 02/22/2018 1128   GFRNONAA 95 10/15/2017 1001   GFRAA >60 02/22/2018 1128   GFRAA 110 10/15/2017 1001    No results found for: SPEP, UPEP  Lab Results  Component Value  Date   WBC 8.5 02/22/2018   NEUTROABS 5.9 02/22/2018  HGB 12.5 (L) 02/22/2018   HCT 38.0 (L) 02/22/2018   MCV 97.2 02/22/2018   PLT 205 02/22/2018      Chemistry      Component Value Date/Time   NA 137 02/22/2018 1128   NA 140 07/13/2013 1542   K 4.0 02/22/2018 1128   K 4.3 07/13/2013 1542   CL 102 02/22/2018 1128   CL 107 07/13/2013 1542   CO2 27 02/22/2018 1128   CO2 26 07/13/2013 1542   BUN 14 02/22/2018 1128   BUN 11 07/13/2013 1542   CREATININE 0.67 02/22/2018 1128   CREATININE 0.76 10/15/2017 1001      Component Value Date/Time   CALCIUM 9.7 02/22/2018 1128   CALCIUM 9.9 07/13/2013 1542   ALKPHOS 45 02/22/2018 1128   ALKPHOS 74 07/13/2013 1542   AST 15 02/22/2018 1128   AST 26 07/13/2013 1542   ALT 10 02/22/2018 1128   ALT 32 07/13/2013 1542   BILITOT 0.4 02/22/2018 1128   BILITOT 0.4 07/13/2013 1542       RADIOGRAPHIC STUDIES: I have personally reviewed the radiological images as listed and agreed with the findings in the report. No results found.   ASSESSMENT & PLAN:  Prostate cancer metastatic to bone ([place]) # Castrate resistant prostate cancer metastases to bone. Lupron every 3 months [11/06/17]. FEB 2020 PSA 32/rising worsening. Currently taking 1 pill sec to dental issues.   #Recommend restarting Xtandi 3 pills a day [patient states that he misunderstood my recommendations last visit.]  Again discussed about chemotherapy patient wants to hold off chemotherapy as much as he can.  #Poor dentition-dental abscess on antibiotics/awaiting dental procedure.   #Bone mets-hold Xgeva; last given on September 25, 2017. HOLD X-geva for now.  # Malignancy related pain- continue fentanyl patch hydrocodone.  Not well controlled.  As per palliative care Josh; agree changing fentanyl patch to every 48 hours and continue hydrocodone.  # DISPOSITION: # Lupron today.  # follow up on 4/27 MD- /cbc/cmpPSA- labs 1 week prior-;Dr.B.    Orders Placed This Encounter   Procedures  . CBC with Differential/Platelet    Standing Status:   Future    Standing Expiration Date:   02/26/2019  . Comprehensive metabolic panel    Standing Status:   Future    Standing Expiration Date:   02/26/2019  . PSA    Standing Status:   Future    Standing Expiration Date:   02/26/2019   All questions were answered. The patient knows to call the clinic with any problems, questions or concerns.      [name], MD 02/25/2018 4:46 PM

## 2018-02-25 NOTE — Progress Notes (Signed)
Robert Short  Telephone:([phone] [phone]   Name: Robert Short Date: 02/25/2018 MRN: [id number]  DOB: [birth year]  Patient Care Team: Olin Hauser, [name], [name], [name]    REASON FOR CONSULTATION: Palliative Care consult requested for this 68 y.o. male with multiple medical problems including stage IV prostate cancer metastatic to bone currently on Xtandi.  Patient has had multiple symptoms including fatigue and pain.  He was referred to palliative care to help with symptom management and to address goals.   SOCIAL HISTORY:     reports that he has been smoking cigarettes. He has a 40.00 pack-year smoking history. He uses smokeless tobacco. He reports current alcohol use. He reports that he does not use drugs.   Patient is married and lives at home with his wife. He has five children. Patient used to work as a Pharmacist, hospital, Administrator, and Engineer, production.  ADVANCE DIRECTIVES:  Does not have  CODE STATUS:   PAST MEDICAL HISTORY: Past Medical History:  Diagnosis Date  . Back pain 10/09/2012  . Bone cancer ([place])   . CAD (coronary artery disease)    a. 08/2015 PCI: LM nl, LAD 20d, LCX nl, RCA 29m RPAV 95 (3.0x18 Xience Alpine DES);  b. 04/2016 PCI ([facility]: RPL 95 (2.75x8 Promus Premier DES).  . Cancer associated pain   . Depression   . History of echocardiogram    a. 08/2016 Echo: EF 50-55%.  .Marland KitchenHistory of kidney stones   . Hyperlipidemia   . Hypertension   . Joint pain   . Prostate cancer ([facility]    a.  s/p prostatectomy ([facility]);  b. bone mets noted 04/2016.  . Right arm pain 01/10/2016  . Right foot pain 01/10/2016  . Right leg pain 01/10/2016  . Tobacco abuse     PAST SURGICAL HISTORY:  Past Surgical History:  Procedure Laterality Date  . CARDIAC  CATHETERIZATION     [facility]  . CARDIAC CATHETERIZATION N/A 08/16/2015   Procedure: Left Heart Cath and Coronary Angiography;  Surgeon: DYolonda Kida MD;  [place];  Service: Cardiovascular;  Laterality: N/A;  . CARDIAC CATHETERIZATION N/A 08/16/2015   Procedure: Coronary Stent Intervention;  Surgeon: DYolonda Kida MD;  [place];  Service: Cardiovascular;  Laterality: N/A;  . CERVIX SURGERY    . PROSTATECTOMY    . SPINE SURGERY    . TONSILLECTOMY    . WRIST SURGERY      HEMATOLOGY/ONCOLOGY HISTORY:  Oncology History   # 2011- PROSTATE CANCER [Gleason 3+4]; s/p Prostatectomy [ also involved bladder neck/ECP; Dr.Polaseck]; July 2014- Biochemical recurrence [PSA 14]- started on Zoladex [Dr.Pandit]; lost to follow up.  # JAN 2017- STAGE IV METASTATIC PROSTATE Cancer to Bone- Feb 13th, 2017-  Lupron q 368m~end of feb]; PSA: 1021; Declined Chemo; April 2017 [xofigo x6; Dr.Crystal]; AUG 2017- Zytiga + Prednisone BID. Bone scan-Jan 2018- improved skeletal metastases.   # MAY 1st 2019- START X-tandi [stopped zytiga/PSA- 8.8/rising]  # Mets to bone- start X-geva q 84M [May 30th]  # Smoker/ chronic pain/pain clinic   #[April 2018; [place] s/p stenting; on Plavix; --------------------------------------------------------------  DIAGNOSIS: '[ ]'  Castrate resistant prostate cancer  STAGE:   4    ;GOALS: Palliative  CURRENT/MOST RECENT THERAPY [April 2019]-Xtandi      Prostate cancer  metastatic to bone ([place])   12/08/2014 Initial Diagnosis    Prostate cancer metastatic to bone [facility])     ALLERGIES:  is allergic to ditropan [oxybutynin].  MEDICATIONS:  Current Outpatient Medications  Medication Sig Dispense Refill  . acetaminophen (TYLENOL) 500 MG tablet Take 1,000 mg by mouth every 8 (eight) hours as needed.     Marland Kitchen albuterol (PROVENTIL HFA;VENTOLIN HFA) 108 (90 Base) MCG/ACT inhaler Inhale 1-2 puffs into the lungs every 6 (six) hours as  needed for wheezing or shortness of breath. 1 Inhaler 0  . amoxicillin-clavulanate (AUGMENTIN) 875-125 MG tablet Take 1 tablet by mouth 2 (two) times daily. 14 tablet 0  . aspirin 81 MG tablet Take 81 mg by mouth daily.    Marland Kitchen atorvastatin (LIPITOR) 80 MG tablet TAKE 1 TABLET (80 MG TOTAL) BY MOUTH DAILY AT 6 PM. 90 tablet 1  . clopidogrel (PLAVIX) 75 MG tablet TAKE 1 TABLET BY MOUTH DAILY 90 tablet 0  . enzalutamide (XTANDI) 40 MG capsule Take 3 capsules (120 mg total) by mouth daily. 84 capsule 3  . ezetimibe (ZETIA) 10 MG tablet Take 1 tablet (10 mg total) by mouth daily. 90 tablet 3  . fentaNYL (DURAGESIC) 100 MCG/HR Place 1 patch onto the skin every 3 (three) days. Along with 82mg patch for total of 125 mcg every 3 days. 10 patch 0  . fentaNYL (DURAGESIC) 25 MCG/HR Place 1 patch onto the skin every 3 (three) days. Use this along with fentanyl patch 100 mcg [total 125 mcg] every 3 days 10 patch 0  . gabapentin (NEURONTIN) 100 MG capsule START 1 CAPSULE DAILY, INCREASE BY 1 CAP EVERY 2-3 DAYS AS TOLERATED UP TO 3 TIMES A DAY, OR MAY TAKE 3 AT ONCE [place] 90 capsule 5  . HYDROcodone-acetaminophen (NORCO) 10-325 MG tablet Take 1 tablet by mouth every 8 (eight) hours as needed. 90 tablet 0  . metoprolol tartrate (LOPRESSOR) 25 MG tablet Take 0.5 tablets (12.5 mg total) by mouth 2 (two) times daily. 90 tablet 1  . nitroGLYCERIN (NITROSTAT) 0.4 MG SL tablet Place 0.4 mg under the tongue every 5 (five) minutes as needed.     . polyethylene glycol (MIRALAX / GLYCOLAX) packet Take 17 g by mouth daily as needed for moderate constipation.    . predniSONE (DELTASONE) 10 MG tablet TAKE 1 TABLET BY MOUTH DAILY WITH BREAKFAST. 30 tablet 2  . promethazine (PHENERGAN) 12.5 MG tablet Take 1-2 tablets (12.5-25 mg total) by mouth every 8 (eight) hours as needed for nausea or vomiting. 60 tablet 3  . valACYclovir (VALTREX) 1000 MG tablet Take 1 tablet (1,000 mg total) by mouth 3 (three) times daily. For shingles,  for 10 days 30 tablet 0  . Wheat Dextrin (BENEFIBER) POWD Stir 2 tsp. TID into 4-8 oz of any non-carbonated beverage or soft food (hot or cold) 500 g PRN   No current facility-administered medications for this visit.     VITAL SIGNS: There were no vitals taken for this visit. There were no vitals filed for this visit.  Estimated body mass index is 25.1 kg/m as calculated from the following:   Height as of 12/05/17: '5\' 11"'  (1.803 m).   Weight as of an earlier encounter on 02/25/18: 180 lb (81.6 kg).  LABS: CBC:    Component Value Date/Time   WBC 8.5 02/22/2018 1128   HGB 12.5 (L) 02/22/2018 1128   HGB 14.4 11/03/2013 1619   HCT 38.0 (L) 02/22/2018 1128   HCT 44.2 11/03/2013  1619   PLT 205 02/22/2018 1128   PLT 149 (L) 11/03/2013 1619   MCV 97.2 02/22/2018 1128   MCV 96 11/03/2013 1619   NEUTROABS 5.9 02/22/2018 1128   NEUTROABS 10.7 (H) 11/03/2013 1619   LYMPHSABS 1.5 02/22/2018 1128   LYMPHSABS 2.0 11/03/2013 1619   MONOABS 0.8 02/22/2018 1128   MONOABS 1.3 (H) 11/03/2013 1619   EOSABS 0.1 02/22/2018 1128   EOSABS 0.1 11/03/2013 1619   BASOSABS 0.0 02/22/2018 1128   BASOSABS 0.1 11/03/2013 1619   Comprehensive Metabolic Panel:    Component Value Date/Time   NA 137 02/22/2018 1128   NA 140 07/13/2013 1542   K 4.0 02/22/2018 1128   K 4.3 07/13/2013 1542   CL 102 02/22/2018 1128   CL 107 07/13/2013 1542   CO2 27 02/22/2018 1128   CO2 26 07/13/2013 1542   BUN 14 02/22/2018 1128   BUN 11 07/13/2013 1542   CREATININE 0.67 02/22/2018 1128   CREATININE 0.76 10/15/2017 1001   GLUCOSE 136 (H) 02/22/2018 1128   GLUCOSE 106 (H) 07/13/2013 1542   CALCIUM 9.7 02/22/2018 1128   CALCIUM 9.9 07/13/2013 1542   AST 15 02/22/2018 1128   AST 26 07/13/2013 1542   ALT 10 02/22/2018 1128   ALT 32 07/13/2013 1542   ALKPHOS 45 02/22/2018 1128   ALKPHOS 74 07/13/2013 1542   BILITOT 0.4 02/22/2018 1128   BILITOT 0.4 07/13/2013 1542   PROT 6.5 02/22/2018 1128   PROT 8.1 07/13/2013  1542   ALBUMIN 4.0 02/22/2018 1128   ALBUMIN 4.3 07/13/2013 1542    RADIOGRAPHIC STUDIES: No results found.  PERFORMANCE STATUS (ECOG) : 1 - Symptomatic but completely ambulatory  Review of Systems As noted above. Otherwise, a complete review of systems is negative.  Physical Exam General: NAD, frail appearing, thin Cardiovascular: regular rate and rhythm Pulmonary: clear ant fields Abdomen: soft, nontender, + bowel sounds GU: no suprapubic tenderness Extremities: no edema, no joint deformities Skin: no rashes Neurological: Weakness but otherwise nonfocal  IMPRESSION: I met with patient and wife today in the clinic. Patient was an add on due to pain. He complains of chronic back pain that averages 5 out of 10 and is affecting his functioning and sleep. Pain is characterized as stabbing and dull. He says he has had the most relief from his fentanyl but says he feels the medication wears off after day #2 following reapplication of a new patch. Patient also takes Norco 5-348m TID but says he cannot tell that it is helping. Patient has tried taking two Norco tablets at a time but also did not feel that helped the pain. He denies any adverse effects from the pain. Patient has tried NSAIDs in the past without much benefit. He is chronically on low dose prednisone.   Case discussed with Dr. BRogue Bussing Will increase frequency of transdermal fentanyl to 1255m Q48H. Continue Norco for now. Would consider rotation to a new SAO for BTP if needed once LAO stable.   Patient says he is eating as much as he can following recent teeth extraction. Discussed oral supplements.   Patient says he recognizes that his cancer is incurable. His goals are still aligned with treatment. However, he is hesitant to pursue chemotherapy. Patient says he would be willing to consider chemotherapy if it is the last resort but says he perceives it as possibly limiting his quality of life. Patient tells me that he would  prefer to focus on quality of life over quantity.  We discussed ACP documents. We reviewed a MOST form together and will plan to complete it at a future visit.   [place] CSRS reviewed.   PLAN: -Continue current scope of treatment -Increase fentanyl patch to 162mg Q48H. (#10 1010m patches, #10 2531mpatches) -Continue Norco for BTP. Consider rotation to another SAO if needed -Narcan nasal spray kit -Continue prednisone daily -Prophylactic bowel regimen -MOST form to be completed at future visit -RTC in 2 weeks   Patient expressed understanding and was in agreement with this plan. He also understands that He can call clinic at any time with any questions, concerns, or complaints.     Time Total: 30 minutes  Visit consisted of counseling and education dealing with the complex and emotionally intense issues of symptom management and palliative care in the setting of serious and potentially life-threatening illness.Greater than 50%  of this time was spent counseling and coordinating care related to the above assessment and plan.  Signed by: JosAltha HarmhD, NP-C [phone] [place])

## 2018-02-26 ENCOUNTER — Ambulatory Visit (INDEPENDENT_AMBULATORY_CARE_PROVIDER_SITE_OTHER): Payer: 59

## 2018-02-26 ENCOUNTER — Telehealth: Payer: Self-pay | Admitting: *Deleted

## 2018-02-26 VITALS — BP 122/72 | HR 70 | Temp 98.6°F | Resp 16 | Ht 71.0 in | Wt 179.0 lb

## 2018-02-26 DIAGNOSIS — Z0000 Encounter for general adult medical examination without abnormal findings: Secondary | ICD-10-CM

## 2018-02-26 NOTE — Progress Notes (Signed)
Subjective:   Robert Short is a 68 y.o. male who presents for Medicare Annual/Subsequent preventive examination.  Review of Systems:   Cardiac Risk Factors include: advanced age (>11men, >32 women);hypertension;male gender;dyslipidemia     Objective:    Vitals: BP 122/72 (BP Location: Left Arm, Patient Position: Sitting, Cuff Size: Normal)   Pulse 70   Temp 98.6 F (37 C) (Oral)   Resp 16   Ht 5\' 11"  (1.803 m)   Wt 179 lb (81.2 kg)   BMI 24.97 kg/m   Body mass index is 24.97 kg/m.  Advanced Directives 02/26/2018 02/25/2018 01/15/2018 09/25/2017 06/26/2017 05/29/2017 05/01/2017  Does Patient Have a Medical Advance Directive? No Yes No No No No Yes  Type of Advance Directive - Living will;Healthcare Power of Attorney - - - - -  Does patient want to make changes to medical advance directive? Yes (MAU/Ambulatory/Procedural Areas - Information given) No - Patient declined - - - - No - Patient declined  Copy of Pray in Chart? - No - copy requested - - - - -  Would patient like information on creating a medical advance directive? - - No - Patient declined No - Patient declined No - Patient declined No - Patient declined -    Tobacco Social History   Tobacco Use  Smoking Status Current Every Day Smoker  . Packs/day: 0.50  . Years: 40.00  . Pack years: 20.00  . Types: Cigarettes  Smokeless Tobacco Current User  . Types: Chew  Tobacco Comment   1 pack every couple days      Ready to quit: Yes Counseling given: Yes Comment: 1 pack every couple days    Clinical Intake:  Pre-visit preparation completed: Yes  Pain : 0-10 Pain Score: 5  Pain Type: Chronic pain Pain Location: Back Pain Orientation: Right, Left, Upper, Lower, Mid Pain Descriptors / Indicators: Aching Pain Onset: More than a month ago Pain Frequency: Constant     Nutritional Status: BMI of 19-24  Normal Nutritional Risks: None Diabetes: No  How often do you need to have someone  help you when you read instructions, pamphlets, or other written materials from your doctor or pharmacy?: 1 - Never What is the last grade level you completed in school?: 1 year college   Interpreter Needed?: No  Information entered by :: Charlie Seda,LPN   Past Medical History:  Diagnosis Date  . Back pain 10/09/2012  . Bone cancer ([place])   . CAD (coronary artery disease)    a. 08/2015 PCI: LM nl, LAD 20d, LCX nl, RCA 95m, RPAV 95 (3.0x18 Xience Alpine DES);  b. 04/2016 PCI [facility]): RPL 95 (2.75x8 Promus Premier DES).  . Cancer associated pain   . Depression   . History of echocardiogram    a. 08/2016 Echo: EF 50-55%.  Marland Kitchen History of kidney stones   . Hyperlipidemia   . Hypertension   . Joint pain   . Prostate cancer [facility])    a.  s/p prostatectomy ([facility]);  b. bone mets noted 04/2016.  . Right arm pain 01/10/2016  . Right foot pain 01/10/2016  . Right leg pain 01/10/2016  . Tobacco abuse    Past Surgical History:  Procedure Laterality Date  . CARDIAC CATHETERIZATION     [facility]  . CARDIAC CATHETERIZATION N/A 08/16/2015   Procedure: Left Heart Cath and Coronary Angiography;  Surgeon: Yolonda Kida, MD;  Location: [facility];  Service: Cardiovascular;  Laterality: N/A;  .  CARDIAC CATHETERIZATION N/A 08/16/2015   Procedure: Coronary Stent Intervention;  Surgeon: Yolonda Kida, MD;  Location: [facility];  Service: Cardiovascular;  Laterality: N/A;  . CERVIX SURGERY    . PROSTATECTOMY    . SPINE SURGERY    . TONSILLECTOMY    . WRIST SURGERY     Family History  Problem Relation Age of Onset  . Heart attack Mother   . Hypertension Mother   . Heart attack Father    Social History   Socioeconomic History  . Marital status: Married    Spouse name: Not on file  . Number of children: Not on file  . Years of education: Not on file  . Highest education level: Some college, no degree  Occupational History  . Occupation: Disabled    Comment: since 1999 -  chronic back pain and DDD.  Social Needs  . Financial resource strain: Not hard at all  . Food insecurity:    Worry: Never true    Inability: Never true  . Transportation needs:    Medical: No    Non-medical: No  Tobacco Use  . Smoking status: Current Every Day Smoker    Packs/day: 0.50    Years: 40.00    Pack years: 20.00    Types: Cigarettes  . Smokeless tobacco: Current User    Types: Chew  . Tobacco comment: 1 pack every couple days   Substance and Sexual Activity  . Alcohol use: Yes    Frequency: Never    Comment: occasionally  . Drug use: No  . Sexual activity: Not on file  Lifestyle  . Physical activity:    Days per week: 0 days    Minutes per session: 0 min  . Stress: Not at all  Relationships  . Social connections:    Talks on phone: Once a week    Gets together: Once a week    Attends religious service: More than 4 times per year    Active member of club or organization: No    Attends meetings of clubs or organizations: Never    Relationship status: Married  Other Topics Concern  . Not on file  Social History Narrative   Lives in [place] with wife.  Does not routinely exercise.    Outpatient Encounter Medications as of 02/26/2018  Medication Sig  . acetaminophen (TYLENOL) 500 MG tablet Take 1,000 mg by mouth every 8 (eight) hours as needed.   Marland Kitchen albuterol (PROVENTIL HFA;VENTOLIN HFA) 108 (90 Base) MCG/ACT inhaler Inhale 1-2 puffs into the lungs every 6 (six) hours as needed for wheezing or shortness of breath.  Marland Kitchen aspirin 81 MG tablet Take 81 mg by mouth daily.  Marland Kitchen atorvastatin (LIPITOR) 80 MG tablet TAKE 1 TABLET (80 MG TOTAL) BY MOUTH DAILY AT 6 PM.  . clopidogrel (PLAVIX) 75 MG tablet TAKE 1 TABLET BY MOUTH DAILY  . enzalutamide (XTANDI) 40 MG capsule Take 3 capsules (120 mg total) by mouth daily.  . fentaNYL (DURAGESIC) 100 MCG/HR Place 1 patch onto the skin every other day. Along with 35mcg patch for total of 125 mcg every 2 days.  . fentaNYL  (DURAGESIC) 25 MCG/HR Place 1 patch onto the skin every other day. Use this along with fentanyl patch 100 mcg [total 125 mcg] every 2 days  . gabapentin (NEURONTIN) 100 MG capsule START 1 CAPSULE DAILY, INCREASE BY 1 CAP EVERY 2-3 DAYS AS TOLERATED UP TO 3 TIMES A DAY, OR MAY TAKE 3 AT ONCE IN  EVENING.  Marland Kitchen HYDROcodone-acetaminophen (NORCO) 10-325 MG tablet Take 1 tablet by mouth every 8 (eight) hours as needed.  . metoprolol tartrate (LOPRESSOR) 25 MG tablet Take 0.5 tablets (12.5 mg total) by mouth 2 (two) times daily.  . polyethylene glycol (MIRALAX / GLYCOLAX) packet Take 17 g by mouth daily as needed for moderate constipation.  . predniSONE (DELTASONE) 10 MG tablet TAKE 1 TABLET BY MOUTH DAILY WITH BREAKFAST.  Marland Kitchen promethazine (PHENERGAN) 12.5 MG tablet Take 1-2 tablets (12.5-25 mg total) by mouth every 8 (eight) hours as needed for nausea or vomiting.  . Wheat Dextrin (BENEFIBER) POWD Stir 2 tsp. TID into 4-8 oz of any non-carbonated beverage or soft food (hot or cold)  . ezetimibe (ZETIA) 10 MG tablet Take 1 tablet (10 mg total) by mouth daily. (Patient not taking: Reported on 02/26/2018)  . naloxone (NARCAN) nasal spray 4 mg/0.1 mL 1 spray into nostril upon signs of opioid overdose. Call 911. May repeat once if no response within 2-3 minutes. (Patient not taking: Reported on 02/26/2018)  . nitroGLYCERIN (NITROSTAT) 0.4 MG SL tablet Place 0.4 mg under the tongue every 5 (five) minutes as needed.   . [DISCONTINUED] amoxicillin-clavulanate (AUGMENTIN) 875-125 MG tablet Take 1 tablet by mouth 2 (two) times daily. (Patient not taking: Reported on 02/26/2018)  . [DISCONTINUED] valACYclovir (VALTREX) 1000 MG tablet Take 1 tablet (1,000 mg total) by mouth 3 (three) times daily. For shingles, for 10 days (Patient not taking: Reported on 02/26/2018)   No facility-administered encounter medications on file as of 02/26/2018.     Activities of Daily Living In your present state of health, do you have any  difficulty performing the following activities: 02/26/2018  Hearing? N  Vision? N  Difficulty concentrating or making decisions? Y  Walking or climbing stairs? Y  Comment sometimes due to tiredness   Dressing or bathing? N  Doing errands, shopping? N  Preparing Food and eating ? N  Using the Toilet? N  In the past six months, have you accidently leaked urine? Y  Comment wears pads for protection, partial bladder removal   Do you have problems with loss of bowel control? N  Managing your Medications? N  Managing your Finances? N  Housekeeping or managing your Housekeeping? N  Some recent data might be hidden    Patient Care Team: Olin Hauser, [name], [name], [name]   Assessment:   This is a routine wellness examination for Robert Short.  Exercise Activities and Dietary recommendations Current Exercise Habits: The patient does not participate in regular exercise at present, Exercise limited by: None identified  Goals    . DIET - INCREASE WATER INTAKE     Recommend drinking 6-8 glasses of water a day     . Quit Smoking     Smoking cessation discussed       Fall Risk Fall Risk  02/26/2018 12/05/2017 04/18/2017 01/30/2017 10/18/2016  Falls in the past year? 0 0 No No No  Number falls in past yr: - - - - -  Injury with Fall? - - - - -  Risk for fall due to : - - - - -  Follow up - Falls evaluation completed - - -   FALL RISK PREVENTION PERTAINING TO THE HOME:  Any stairs in or around the home? No  If so, are there any without handrails? na  Home free  of loose throw rugs in walkways, pet beds, electrical cords, etc? Yes  Adequate lighting in your home to reduce risk of falls? Yes   ASSISTIVE DEVICES UTILIZED TO PREVENT FALLS:  Life alert? No  Use of a cane, walker or w/c? Yes  Grab bars in the bathroom? Yes  Shower chair  or bench in shower? No  Elevated toilet seat or a handicapped toilet? No   DME ORDERS:  DME order needed?  No   TIMED UP AND GO:  Was the test performed? Yes .  Length of time to ambulate 10 feet: 11 sec.   GAIT:  Appearance of gait: Gait stead-fast with the use of an assistive device.  Education: Fall risk prevention has been discussed.  Intervention(s) required? No    Depression Screen PHQ 2/9 Scores 02/26/2018 12/05/2017 04/18/2017 01/30/2017  PHQ - 2 Score 0 0 0 2  PHQ- 9 Score - - - 9    Cognitive Function     6CIT Screen 02/26/2018 02/26/2018 01/30/2017  What Year? 0 points 0 points 0 points  What month? 0 points 0 points 0 points  What time? 0 points 0 points 0 points  Count back from 20 0 points 0 points 0 points  Months in reverse 0 points 0 points 0 points  Repeat phrase 0 points 0 points 0 points  Total Score 0 0 0    Immunization History  Administered Date(s) Administered  . Influenza Split 12/04/2011  . Influenza, High Dose Seasonal PF 10/18/2016, 10/19/2017  . Influenza,inj,Quad PF,6+ Mos 10/29/2012, 09/30/2014, 10/11/2015  . Influenza-Unspecified 09/30/2014, 10/11/2015  . Pneumococcal Conjugate-13 10/18/2016  . Pneumococcal Polysaccharide-23 11/02/2017  . Tdap 08/26/2013    Qualifies for Shingles Vaccine? Yes  Zostavax completed n/a. Due for Shingrix. Education has been provided regarding the importance of this vaccine. Pt has been advised to call insurance company to determine out of pocket expense. Advised may also receive vaccine at local pharmacy or Health Dept. Verbalized acceptance and understanding.  Tdap: up to date   Flu Vaccine: up to date   Pneumococcal Vaccine: up to date   Screening Tests Health Maintenance  Topic Date Due  . COLONOSCOPY  01/02/2021  . TETANUS/TDAP  08/27/2023  . INFLUENZA VACCINE  Completed  . Hepatitis C Screening  Completed  . PNA vac Low Risk Adult  Completed   Cancer Screenings:  Colorectal Screening:  Completed 01/2011. Repeat every 10 years;    Lung Cancer Screening: (Low Dose CT Chest recommended if Age 87-80 years, 30 pack-year currently smoking OR have quit w/in 15years.) does not qualify.     Additional Screening:  Hepatitis C Screening: does qualify; Completed 02/16/2017  Vision Screening: Recommended annual ophthalmology exams for early detection of glaucoma and other disorders of the eye. Is the patient up to date with their annual eye exam?  Yes  Who is the provider or what is the name of the office in which the pt attends annual eye exams? Dr.Nice   Dental Screening: Recommended annual dental exams for proper oral hygiene  Community Resource Referral:  CRR required this visit?  No       Plan:    I have personally reviewed and addressed the Medicare Annual Wellness questionnaire and have noted the following in the patient's chart:  A. Medical and social history B. Use of alcohol, tobacco or illicit drugs  C. Current medications and supplements D. Functional ability and status E.  Nutritional status F.  Physical activity G. Advance directives H.  List of other physicians I.  Hospitalizations, surgeries, and ER visits in previous 12 months J.  [place] such as hearing and vision if needed, cognitive and depression L. Referrals and appointments   In addition, I have reviewed and discussed with patient certain preventive protocols, quality metrics, and best practice recommendations. A written personalized care plan for preventive services as well as general preventive health recommendations were provided to patient.   Signed,  Tyler Aas, LPN Nurse Health Advisor   Nurse Notes:none

## 2018-02-26 NOTE — Telephone Encounter (Signed)
PA submitted for Fentanyl Patches 25 MCG  Jimmey Sandefer Key: [id number] - PA Case ID: [id number] - Rx #: [id number]

## 2018-02-26 NOTE — Patient Instructions (Signed)
Mr. Goytia , Thank you for taking time to come for your Medicare Wellness Visit. I appreciate your ongoing commitment to your health goals. Please review the following plan we discussed and let me know if I can assist you in the future.   Screening recommendations/referrals: Colonoscopy: completed 01/03/2011 Recommended yearly ophthalmology/optometry visit for glaucoma screening and checkup Recommended yearly dental visit for hygiene and checkup  Vaccinations: Influenza vaccine: up to date Pneumococcal vaccine: up to date Tdap vaccine: up to date Shingles vaccine: shingrix eligible, check with your insurance company for coverage     Advanced directives: Advance directive discussed with you today. You have been provided a copy for you to complete at home and have notarized. Once this is complete please bring a copy in to our office so we can scan it into your chart.  Conditions/risks identified: smoking cessation discussed   Next appointment: Follow up on 04/19/2018 with Dr.Karamalegos. Follow up in one year for your annual wellness exam.   Preventive Care 67 Years and Older, Male Preventive care refers to lifestyle choices and visits with your health care provider that can promote health and wellness. What does preventive care include?  A yearly physical exam. This is also called an annual well check.  Dental exams once or twice a year.  Routine eye exams. Ask your health care provider how often you should have your eyes checked.  Personal lifestyle choices, including:  Daily care of your teeth and gums.  Regular physical activity.  Eating a healthy diet.  Avoiding tobacco and drug use.  Limiting alcohol use.  Practicing safe sex.  Taking low doses of aspirin every day.  Taking vitamin and mineral supplements as recommended by your health care provider. What happens during an annual well check? The services and screenings done by your health care provider during your annual  well check will depend on your age, overall health, lifestyle risk factors, and family history of disease. Counseling  Your health care provider may ask you questions about your:  Alcohol use.  Tobacco use.  Drug use.  Emotional well-being.  Home and relationship well-being.  Sexual activity.  Eating habits.  History of falls.  Memory and ability to understand (cognition).  Work and work Statistician. Screening  You may have the following tests or measurements:  Height, weight, and BMI.  Blood pressure.  Lipid and cholesterol levels. These may be checked every 5 years, or more frequently if you are over 67 years old.  Skin check.  Lung cancer screening. You may have this screening every year starting at age 20 if you have a 30-pack-year history of smoking and currently smoke or have quit within the past 15 years.  Fecal occult blood test (FOBT) of the stool. You may have this test every year starting at age 67.  Flexible sigmoidoscopy or colonoscopy. You may have a sigmoidoscopy every 5 years or a colonoscopy every 10 years starting at age 67.  Prostate cancer screening. Recommendations will vary depending on your family history and other risks.  Hepatitis C blood test.  Hepatitis B blood test.  Sexually transmitted disease (STD) testing.  Diabetes screening. This is done by checking your blood sugar (glucose) after you have not eaten for a while (fasting). You may have this done every 1-3 years.  Abdominal aortic aneurysm (AAA) screening. You may need this if you are a current or former smoker.  Osteoporosis. You may be screened starting at age 67 if you are at high risk. Talk  with your health care provider about your test results, treatment options, and if necessary, the need for more tests. Vaccines  Your health care provider may recommend certain vaccines, such as:  Influenza vaccine. This is recommended every year.  Tetanus, diphtheria, and acellular  pertussis (Tdap, Td) vaccine. You may need a Td booster every 10 years.  Zoster vaccine. You may need this after age 18.  Pneumococcal 13-valent conjugate (PCV13) vaccine. One dose is recommended after age 25.  Pneumococcal polysaccharide (PPSV23) vaccine. One dose is recommended after age 89. Talk to your health care provider about which screenings and vaccines you need and how often you need them. This information is not intended to replace advice given to you by your health care provider. Make sure you discuss any questions you have with your health care provider. Document Released: 01/15/2015 Document Revised: 09/08/2015 Document Reviewed: 10/20/2014 Elsevier Interactive Patient Education  2017 [place] Prevention in the Home Falls can cause injuries. They can happen to people of all ages. There are many things you can do to make your home safe and to help prevent falls. What can I do on the outside of my home?  Regularly fix the edges of walkways and driveways and fix any cracks.  Remove anything that might make you trip as you walk through a door, such as a raised step or threshold.  Trim any bushes or trees on the path to your home.  Use bright outdoor lighting.  Clear any walking paths of anything that might make someone trip, such as rocks or tools.  Regularly check to see if handrails are loose or broken. Make sure that both sides of any steps have handrails.  Any raised decks and porches should have guardrails on the edges.  Have any leaves, snow, or ice cleared regularly.  Use sand or salt on walking paths during winter.  Clean up any spills in your garage right away. This includes oil or grease spills. What can I do in the bathroom?  Use night lights.  Install grab bars by the toilet and in the tub and shower. Do not use towel bars as grab bars.  Use non-skid mats or decals in the tub or shower.  If you need to sit down in the shower, use a plastic,  non-slip stool.  Keep the floor dry. Clean up any water that spills on the floor as soon as it happens.  Remove soap buildup in the tub or shower regularly.  Attach bath mats securely with double-sided non-slip rug tape.  Do not have throw rugs and other things on the floor that can make you trip. What can I do in the bedroom?  Use night lights.  Make sure that you have a light by your bed that is easy to reach.  Do not use any sheets or blankets that are too big for your bed. They should not hang down onto the floor.  Have a firm chair that has side arms. You can use this for support while you get dressed.  Do not have throw rugs and other things on the floor that can make you trip. What can I do in the kitchen?  Clean up any spills right away.  Avoid walking on wet floors.  Keep items that you use a lot in easy-to-reach places.  If you need to reach something above you, use a strong step stool that has a grab bar.  Keep electrical cords out of the way.  Do  not use floor polish or wax that makes floors slippery. If you must use wax, use non-skid floor wax.  Do not have throw rugs and other things on the floor that can make you trip. What can I do with my stairs?  Do not leave any items on the stairs.  Make sure that there are handrails on both sides of the stairs and use them. Fix handrails that are broken or loose. Make sure that handrails are as long as the stairways.  Check any carpeting to make sure that it is firmly attached to the stairs. Fix any carpet that is loose or worn.  Avoid having throw rugs at the top or bottom of the stairs. If you do have throw rugs, attach them to the floor with carpet tape.  Make sure that you have a light switch at the top of the stairs and the bottom of the stairs. If you do not have them, ask someone to add them for you. What else can I do to help prevent falls?  Wear shoes that:  Do not have high heels.  Have rubber  bottoms.  Are comfortable and fit you well.  Are closed at the toe. Do not wear sandals.  If you use a stepladder:  Make sure that it is fully opened. Do not climb a closed stepladder.  Make sure that both sides of the stepladder are locked into place.  Ask someone to hold it for you, if possible.  Clearly mark and make sure that you can see:  Any grab bars or handrails.  First and last steps.  Where the edge of each step is.  Use tools that help you move around (mobility aids) if they are needed. These include:  Canes.  Walkers.  Scooters.  Crutches.  Turn on the lights when you go into a dark area. Replace any light bulbs as soon as they burn out.  Set up your furniture so you have a clear path. Avoid moving your furniture around.  If any of your floors are uneven, fix them.  If there are any pets around you, be aware of where they are.  Review your medicines with your doctor. Some medicines can make you feel dizzy. This can increase your chance of falling. Ask your doctor what other things that you can do to help prevent falls. This information is not intended to replace advice given to you by your health care provider. Make sure you discuss any questions you have with your health care provider. Document Released: 10/15/2008 Document Revised: 05/27/2015 Document Reviewed: 01/23/2014 Elsevier Interactive Patient Education  2017 [employer].   Steps to Quit Smoking  Smoking tobacco can be bad for your health. It can also affect almost every organ in your body. Smoking puts you and people around you at risk for many serious long-lasting (chronic) diseases. Quitting smoking is hard, but it is one of the best things that you can do for your health. It is never too late to quit. What are the benefits of quitting smoking? When you quit smoking, you lower your risk for getting serious diseases and conditions. They can include:  Lung cancer or lung disease.  Heart  disease.  Stroke.  Heart attack.  Not being able to have children (infertility).  Weak bones (osteoporosis) and broken bones (fractures). If you have coughing, wheezing, and shortness of breath, those symptoms may get better when you quit. You may also get sick less often. If you are pregnant, quitting smoking  can help to lower your chances of having a baby of low birth weight. What can I do to help me quit smoking? Talk with your doctor about what can help you quit smoking. Some things you can do (strategies) include:  Quitting smoking totally, instead of slowly cutting back how much you smoke over a period of time.  Going to in-person counseling. You are more likely to quit if you go to many counseling sessions.  Using resources and support systems, such as: ? Database administrator with a Social worker. ? Phone quitlines. ? Careers information officer. ? Support groups or group counseling. ? Text messaging programs. ? Mobile phone apps or applications.  Taking medicines. Some of these medicines may have nicotine in them. If you are pregnant or breastfeeding, do not take any medicines to quit smoking unless your doctor says it is okay. Talk with your doctor about counseling or other things that can help you. Talk with your doctor about using more than one strategy at the same time, such as taking medicines while you are also going to in-person counseling. This can help make quitting easier. What things can I do to make it easier to quit? Quitting smoking might feel very hard at first, but there is a lot that you can do to make it easier. Take these steps:  Talk to your family and friends. Ask them to support and encourage you.  Call phone quitlines, reach out to support groups, or work with a Social worker.  Ask people who smoke to not smoke around you.  Avoid places that make you want (trigger) to smoke, such as: ? Bars. ? Parties. ? Smoke-break areas at work.  Spend time with people who do  not smoke.  Lower the stress in your life. Stress can make you want to smoke. Try these things to help your stress: ? Getting regular exercise. ? Deep-breathing exercises. ? Yoga. ? Meditating. ? Doing a body scan. To do this, close your eyes, focus on one area of your body at a time from head to toe, and notice which parts of your body are tense. Try to relax the muscles in those areas.  Download or buy apps on your mobile phone or tablet that can help you stick to your quit plan. There are many free apps, such as QuitGuide from the [employer] Office manager for Disease Control and Prevention). You can find more support from smokefree.gov and other websites. This information is not intended to replace advice given to you by your health care provider. Make sure you discuss any questions you have with your health care provider. Document Released: 10/15/2008 Document Revised: 08/17/2015 Document Reviewed: 05/05/2014 Elsevier Interactive Patient Education  2019 [employer].

## 2018-02-27 ENCOUNTER — Telehealth: Payer: Self-pay | Admitting: Pharmacist

## 2018-02-27 NOTE — Telephone Encounter (Signed)
Oral Chemotherapy Pharmacist Encounter   Attempted to reach patient for follow up on oral medication: Xtandi. No answer. Unable to leave VM for patient to call back.    Darl Pikes, PharmD, BCPS, [facility] Hematology/Oncology Clinical Pharmacist [facility] [phone]  02/27/2018 9:45 AM

## 2018-03-04 ENCOUNTER — Telehealth: Payer: Self-pay | Admitting: *Deleted

## 2018-03-04 NOTE — Telephone Encounter (Signed)
msg received from patient's wife. She requested to talk to [facility].  I returned pt's wife call. She inquired if fentanyl 25 mcg patches have been approved by insurance. I explained that covermymeds has approved the fenantyl.  Robert Short (Key: [id number]) - [id number] fentaNYL 25MCG/HR 72 hr patches Status: PA Response - Approved

## 2018-03-06 ENCOUNTER — Other Ambulatory Visit: Payer: Self-pay | Admitting: Nurse Practitioner

## 2018-03-06 DIAGNOSIS — C61 Malignant neoplasm of prostate: Secondary | ICD-10-CM

## 2018-03-06 DIAGNOSIS — C7951 Secondary malignant neoplasm of bone: Principal | ICD-10-CM

## 2018-03-06 MED ORDER — HYDROCODONE-ACETAMINOPHEN 10-325 MG PO TABS: 1.0000 | ORAL_TABLET | Freq: Three times a day (TID) | ORAL | 0 refills | Status: DC | PRN

## 2018-03-08 ENCOUNTER — Ambulatory Visit (INDEPENDENT_AMBULATORY_CARE_PROVIDER_SITE_OTHER): Payer: 59 | Admitting: Family Medicine

## 2018-03-08 ENCOUNTER — Encounter: Payer: Self-pay | Admitting: Family Medicine

## 2018-03-08 ENCOUNTER — Other Ambulatory Visit: Payer: Self-pay

## 2018-03-08 VITALS — BP 148/83 | HR 93 | Temp 98.6°F | Resp 16 | Ht 70.0 in | Wt 176.0 lb

## 2018-03-08 DIAGNOSIS — K047 Periapical abscess without sinus: Secondary | ICD-10-CM

## 2018-03-08 MED ORDER — AMOXICILLIN-POT CLAVULANATE 875-125 MG PO TABS: 1.0000 | ORAL_TABLET | Freq: Two times a day (BID) | ORAL | 0 refills | Status: DC

## 2018-03-08 MED ORDER — METRONIDAZOLE 500 MG PO TABS: 500.0000 mg | ORAL_TABLET | Freq: Three times a day (TID) | ORAL | 0 refills | Status: DC

## 2018-03-08 NOTE — Patient Instructions (Addendum)
Thank you for coming to the office today.  For dental abscess  Try metronidazole now 500mg  3 times a day for 7 days - no alcohol  Trial again on augmentin 2 times a day for 7 days  Follow back up with Dentist if not resolving. May need oral surgeon   Please schedule a Follow-up Appointment to: Return in about 1 week (around 03/15/2018), or if symptoms worsen or fail to improve, for dental infection.  If you have any other questions or concerns, please feel free to call the office or send a message through [place]. You may also schedule an earlier appointment if necessary.  Additionally, you may be receiving a survey about your experience at our office within a few days to 1 week by e-mail or mail. We value your feedback.  Nobie Putnam, DO [place]

## 2018-03-08 NOTE — Progress Notes (Signed)
Subjective:    Patient ID: Robert Short, male    DOB: 1950-10-26, 68 y.o.   MRN: [id number]  Robert Short is a 68 y.o. male presenting on 03/08/2018 for Dental Pain (abcess,  infection onset 4 weeks finished antibiotics )   HPI   Dental Abscess Oncology, did CT neck due to pain and swelling, identified infection dental abscess, Referred to Dr Vivianne Master, [facility] dentist. He had dental extraction and antibiotic Amoxicillin for 7 days, he using chlorhexidine mouthwash - Seems to have improved initially then after antibiotics while later it seemed to worsen again with recurrent swelling and pain and swollen lymph node or nodular density under chin - Today returns to ask about repeat antibiotics - He has not returned to dentist Admits jaw pain Denies any fevers or chills or sweats, nausea vomiting, headache   Depression screen [facility] 2/9 03/08/2018 02/26/2018 12/05/2017  Decreased Interest 0 0 0  Down, Depressed, Hopeless 0 0 0  PHQ - 2 Score 0 0 0  Altered sleeping - - -  Tired, decreased energy - - -  Change in appetite - - -  Feeling bad or failure about yourself  - - -  Trouble concentrating - - -  Moving slowly or fidgety/restless - - -  Suicidal thoughts - - -  PHQ-9 Score - - -  Difficult doing work/chores - - -  Some recent data might be hidden    Social History   Tobacco Use  . Smoking status: Current Every Day Smoker    Packs/day: 0.50    Years: 40.00    Pack years: 20.00    Types: Cigarettes  . Smokeless tobacco: Current User    Types: Chew  . Tobacco comment: 1 pack every couple days   Substance Use Topics  . Alcohol use: Yes    Frequency: Never    Comment: occasionally  . Drug use: No    Review of Systems Per HPI unless specifically indicated above     Objective:    BP (!) 148/83   Pulse 93   Temp 98.6 F (37 C) (Oral)   Resp 16   Ht 5\' 10"  (1.778 m)   Wt 176 lb (79.8 kg)   BMI 25.25 kg/m   Wt Readings from Last 3 Encounters:  03/08/18 176 lb  (79.8 kg)  02/26/18 179 lb (81.2 kg)  02/25/18 180 lb (81.6 kg)    Physical Exam Vitals signs and nursing note reviewed.  Constitutional:      General: He is not in acute distress.    Appearance: He is well-developed. He is not diaphoretic.     Comments: Well-appearing, comfortable, cooperative  HENT:     Head: Normocephalic and atraumatic.     Mouth/Throat:     Comments: Poor dentition, with several missing/pulled teeth, front lower gum with two distinct large missing teeth with open cavity and some debris, no evidence of erythema or drainage or purulence. Eyes:     General:        Right eye: No discharge.        Left eye: No discharge.     Conjunctiva/sclera: Conjunctivae normal.  Cardiovascular:     Rate and Rhythm: Normal rate.     Pulses: Normal pulses.  Pulmonary:     Effort: Pulmonary effort is normal.  Lymphadenopathy:     Head:     Right side of head: Submental adenopathy present.  Skin:    General: Skin is warm and dry.  Findings: No erythema or rash.  Neurological:     Mental Status: He is alert and oriented to person, place, and time.  Psychiatric:        Behavior: Behavior normal.     Comments: Well groomed, good eye contact, normal speech and thoughts      I have personally reviewed the radiology report from 12/19/17 on CT Maxillofacial.  CLINICAL DATA: History prostate cancer, swelling and pain under the chin for 2-3 weeks.  EXAM: CT MAXILLOFACIAL WITHOUT AND WITH CONTRAST  TECHNIQUE: Multidetector CT imaging of the maxillofacial structures was performed without and with intravenous contrast. Multiplanar CT image reconstructions were also generated.  CONTRAST: 51mL OMNIPAQUE IOHEXOL 300 MG/ML SOLN  COMPARISON: None.  FINDINGS: Osseous: No lytic lesion of the maxilla or mandible. No facial fracture. Sclerotic change of the skull base, and visualized cervical vertebrae consistent with treated prostate cancer. Solid arthrodesis across C3  through C6 cervical fusion.  Poor dentition. Periapical lucency of a LEFT mandibular canine with anterior mandibular cortex destruction, see series 11, image 72, could be painful. Poor dentition with multiple dental caries of the residual mandibular teeth. Patient is edentulous in the maxilla.  Orbits: Negative. No traumatic or inflammatory finding.  Sinuses: Clear.  Soft tissues: There is cellulitic change associated with the lower lip in the vicinity of the periodontal disease and dental caries. There is a reactive appearing 4 mm LEFT level 1A lymph node and a similar reactive LEFT level 1B lymph node. There is a tiny metallic fragment in the soft tissues of the lower lip, RIGHT paramedian location.  Limited intracranial: Negative  IMPRESSION: 1. Periodontal disease and dental caries. Periapical lucency of a LEFT mandibular canine with anterior mandibular cortex destruction, could be painful. 2. Poor dentition with multiple dental caries of the residual mandibular teeth. Patient is edentulous in the maxilla. 3. Sclerotic change in the visible skull base and upper cervical region consistent with treated prostate metastases. 4. Cellulitic change in the lower face, with small reactive lymph nodes. No abscess.   Electronically Signed By: Staci Righter M.D. On: 12/19/2017 11:56  Results for orders placed or performed in visit on 02/22/18  PSA  Result Value Ref Range   Prostatic Specific Antigen 32.47 (H) 0.00 - 4.00 ng/mL  CBC with Differential  Result Value Ref Range   WBC 8.5 4.0 - 10.5 K/uL   RBC 3.91 (L) 4.22 - 5.81 MIL/uL   Hemoglobin 12.5 (L) 13.0 - 17.0 g/dL   HCT 38.0 (L) 39.0 - 52.0 %   MCV 97.2 80.0 - 100.0 fL   MCH 32.0 26.0 - 34.0 pg   MCHC 32.9 30.0 - 36.0 g/dL   RDW 13.8 11.5 - 15.5 %   Platelets 205 150 - 400 K/uL   nRBC 0.0 0.0 - 0.2 %   Neutrophils Relative % 69 %   Neutro Abs 5.9 1.7 - 7.7 K/uL   Lymphocytes Relative 18 %   Lymphs Abs 1.5 0.7  - 4.0 K/uL   Monocytes Relative 10 %   Monocytes Absolute 0.8 0.1 - 1.0 K/uL   Eosinophils Relative 1 %   Eosinophils Absolute 0.1 0.0 - 0.5 K/uL   Basophils Relative 1 %   Basophils Absolute 0.0 0.0 - 0.1 K/uL   Immature Granulocytes 1 %   Abs Immature Granulocytes 0.06 0.00 - 0.07 K/uL  Comprehensive metabolic panel  Result Value Ref Range   Sodium 137 135 - 145 mmol/L   Potassium 4.0 3.5 - 5.1 mmol/L  Chloride 102 98 - 111 mmol/L   CO2 27 22 - 32 mmol/L   Glucose, Bld 136 (H) 70 - 99 mg/dL   BUN 14 8 - 23 mg/dL   Creatinine, Ser 0.67 0.61 - 1.24 mg/dL   Calcium 9.7 8.9 - 10.3 mg/dL   Total Protein 6.5 6.5 - 8.1 g/dL   Albumin 4.0 3.5 - 5.0 g/dL   AST 15 15 - 41 U/L   ALT 10 0 - 44 U/L   Alkaline Phosphatase 45 38 - 126 U/L   Total Bilirubin 0.4 0.3 - 1.2 mg/dL   GFR calc non Af Amer >60 >60 mL/min   GFR calc Af Amer >60 >60 mL/min   Anion gap 8 5 - 15      Assessment & Plan:   Problem List Items Addressed This Visit    None    Visit Diagnoses    Dental abscess    -  Primary   Relevant Medications   amoxicillin-clavulanate (AUGMENTIN) 875-125 MG tablet   metroNIDAZOLE (FLAGYL) 500 MG tablet      Suspected dental infection of tooth/gum due to dental decay, based on exam and history of recent pulled extracted tooth due to abscess, now improved but concern may be recurring. Treated with initial Augmentin monotherapy Without evidence of systemic symptoms. Afebrile  Plan Trial on Augmentin again repeat course and add Metronidazole for anaerobic component Continue mouth wash as prior rx Follow-up with dentist if not improving   Meds ordered this encounter  Medications  . amoxicillin-clavulanate (AUGMENTIN) 875-125 MG tablet    Sig: Take 1 tablet by mouth 2 (two) times daily.    Dispense:  14 tablet    Refill:  0  . metroNIDAZOLE (FLAGYL) 500 MG tablet    Sig: Take 1 tablet (500 mg total) by mouth 3 (three) times daily. Do not drink alcohol while taking this  medicine.    Dispense:  21 tablet    Refill:  0      Follow up plan: Return in about 1 week (around 03/15/2018), or if symptoms worsen or fail to improve, for dental infection.   Nobie Putnam, [facility] 03/08/2018, 3:02 PM

## 2018-03-09 ENCOUNTER — Encounter: Payer: Self-pay | Admitting: Family Medicine

## 2018-03-11 ENCOUNTER — Encounter: Payer: 59 | Admitting: Hospice and Palliative Medicine

## 2018-03-11 ENCOUNTER — Telehealth: Payer: Self-pay | Admitting: Family Medicine

## 2018-03-11 ENCOUNTER — Encounter: Payer: Self-pay | Admitting: Family Medicine

## 2018-03-11 NOTE — Telephone Encounter (Signed)
Please notify patient that letter for Jury Duty excuse is ready for pick up from front office at anytime during business hours.  Robert Short, [facility] 03/11/2018, 12:01 PM

## 2018-03-13 MED FILL — XTANDI 40 MG CAPSULE: 40 | 28 days supply | Qty: 84 | Fill #1

## 2018-03-15 ENCOUNTER — Other Ambulatory Visit: Payer: Self-pay

## 2018-03-15 ENCOUNTER — Encounter: Payer: Self-pay | Admitting: Hospice and Palliative Medicine

## 2018-03-15 ENCOUNTER — Inpatient Hospital Stay: Payer: 59 | Attending: Hospice and Palliative Medicine | Admitting: Hospice and Palliative Medicine

## 2018-03-15 VITALS — BP 127/84 | HR 85 | Temp 97.0°F | Resp 18 | Wt 177.2 lb

## 2018-03-15 DIAGNOSIS — G893 Neoplasm related pain (acute) (chronic): Secondary | ICD-10-CM | POA: Insufficient documentation

## 2018-03-15 DIAGNOSIS — Z79899 Other long term (current) drug therapy: Secondary | ICD-10-CM | POA: Diagnosis not present

## 2018-03-15 DIAGNOSIS — C7951 Secondary malignant neoplasm of bone: Secondary | ICD-10-CM | POA: Diagnosis not present

## 2018-03-15 DIAGNOSIS — C61 Malignant neoplasm of prostate: Secondary | ICD-10-CM | POA: Diagnosis not present

## 2018-03-15 NOTE — Progress Notes (Signed)
Patient here for palliative car follow up. Completed antibiotic course this morning.

## 2018-03-15 NOTE — Progress Notes (Signed)
[place]  [phone] [phone]   Name: Robert Short Date: 03/15/2018 MRN: [id number]  DOB: Aug 01, 1950  Patient Care Team: Olin Hauser, [name], [name], [name]    REASON FOR CONSULTATION: Palliative Care consult requested for this 68 y.o. male with multiple medical problems including stage IV prostate cancer metastatic to bone currently on Xtandi.  Patient has had multiple symptoms including fatigue and pain.  He was referred to palliative care to help with symptom management and to address goals.   SOCIAL HISTORY:     reports that he has been smoking cigarettes. He has a 20.00 pack-year smoking history. His smokeless tobacco use includes chew. He reports current alcohol use. He reports that he does not use drugs.   Patient is married and lives at home with his wife. He has five children. Patient used to work as a Pharmacist, hospital, Administrator, and Engineer, production.  ADVANCE DIRECTIVES:  Does not have  CODE STATUS:   PAST MEDICAL HISTORY: Past Medical History:  Diagnosis Date  . Back pain 10/09/2012  . Bone cancer ([place])   . CAD (coronary artery disease)    a. 08/2015 PCI: LM nl, LAD 20d, LCX nl, RCA 30m RPAV 95 (3.0x18 Xience Alpine DES);  b. 04/2016 PCI ([facility]: RPL 95 (2.75x8 Promus Premier DES).  . Cancer associated pain   . Depression   . History of echocardiogram    a. 08/2016 Echo: EF 50-55%.  .Marland KitchenHistory of kidney stones   . Hyperlipidemia   . Hypertension   . Joint pain   . Prostate cancer ([facility]    a.  s/p prostatectomy ([facility]);  b. bone mets noted 04/2016.  . Right arm pain 01/10/2016  . Right foot pain 01/10/2016  . Right leg pain 01/10/2016  . Tobacco abuse     PAST SURGICAL HISTORY:  Past Surgical History:  Procedure Laterality Date  .  CARDIAC CATHETERIZATION     [facility]  . CARDIAC CATHETERIZATION N/A 08/16/2015   Procedure: Left Heart Cath and Coronary Angiography;  Surgeon: DYolonda Kida MD;  [facility];  Service: Cardiovascular;  Laterality: N/A;  . CARDIAC CATHETERIZATION N/A 08/16/2015   Procedure: Coronary Stent Intervention;  Surgeon: DYolonda Kida MD;  [facility];  Service: Cardiovascular;  Laterality: N/A;  . CERVIX SURGERY    . PROSTATECTOMY    . SPINE SURGERY    . TONSILLECTOMY    . WRIST SURGERY      HEMATOLOGY/ONCOLOGY HISTORY:  Oncology History   # 2011- PROSTATE CANCER [Gleason 3+4]; s/p Prostatectomy [ also involved bladder neck/ECP; Dr.Polaseck]; July 2014- Biochemical recurrence [PSA 14]- started on Zoladex [Dr.Pandit]; lost to follow up.  # JAN 2017- STAGE IV METASTATIC PROSTATE Cancer to Bone- Feb 13th, 2017-  Lupron q 351m~end of feb]; PSA: 1021; Declined Chemo; April 2017 [xofigo x6; Dr.Crystal]; AUG 2017- Zytiga + Prednisone BID. Bone scan-Jan 2018- improved skeletal metastases.   # MAY 1st 2019- START X-tandi [stopped zytiga/PSA- 8.8/rising]  # Mets to bone- start X-geva q 6M [May 30th]  # Smoker/ chronic pain/pain clinic   #[April 2018; [place]]- s/p stenting; on Plavix; --------------------------------------------------------------  DIAGNOSIS: '[ ]'  Castrate resistant prostate cancer  STAGE:   4    ;GOALS: Palliative  CURRENT/MOST RECENT THERAPY [April 2019]-Xtandi  Prostate cancer metastatic to bone ([place])   12/08/2014 Initial Diagnosis    Prostate cancer metastatic to bone [facility])     ALLERGIES:  is allergic to ditropan [oxybutynin].  MEDICATIONS:  Current Outpatient Medications  Medication Sig Dispense Refill  . acetaminophen (TYLENOL) 500 MG tablet Take 1,000 mg by mouth every 8 (eight) hours as needed.     Marland Kitchen albuterol (PROVENTIL HFA;VENTOLIN HFA) 108 (90 Base) MCG/ACT inhaler Inhale 1-2 puffs into the lungs every 6 (six)  hours as needed for wheezing or shortness of breath. 1 Inhaler 0  . aspirin 81 MG tablet Take 81 mg by mouth daily.    Marland Kitchen atorvastatin (LIPITOR) 80 MG tablet TAKE 1 TABLET (80 MG TOTAL) BY MOUTH DAILY AT 6 PM. 90 tablet 1  . clopidogrel (PLAVIX) 75 MG tablet TAKE 1 TABLET BY MOUTH DAILY 90 tablet 0  . enzalutamide (XTANDI) 40 MG capsule Take 3 capsules (120 mg total) by mouth daily. 84 capsule 3  . fentaNYL (DURAGESIC) 100 MCG/HR Place 1 patch onto the skin every other day. Along with 60mg patch for total of 125 mcg every 2 days. 10 patch 0  . fentaNYL (DURAGESIC) 25 MCG/HR Place 1 patch onto the skin every other day. Use this along with fentanyl patch 100 mcg [total 125 mcg] every 2 days 10 patch 0  . gabapentin (NEURONTIN) 100 MG capsule START 1 CAPSULE DAILY, INCREASE BY 1 CAP EVERY 2-3 DAYS AS TOLERATED UP TO 3 TIMES A DAY, OR MAY TAKE 3 AT ONCE IN [place] 90 capsule 5  . HYDROcodone-acetaminophen (NORCO) 10-325 MG tablet Take 1 tablet by mouth every 8 (eight) hours as needed. 90 tablet 0  . metoprolol tartrate (LOPRESSOR) 25 MG tablet Take 0.5 tablets (12.5 mg total) by mouth 2 (two) times daily. 90 tablet 1  . naloxone (NARCAN) nasal spray 4 mg/0.1 mL 1 spray into nostril upon signs of opioid overdose. Call 911. May repeat once if no response within 2-3 minutes. 1 kit 0  . nitroGLYCERIN (NITROSTAT) 0.4 MG SL tablet Place 0.4 mg under the tongue every 5 (five) minutes as needed.     . polyethylene glycol (MIRALAX / GLYCOLAX) packet Take 17 g by mouth daily as needed for moderate constipation.    . predniSONE (DELTASONE) 10 MG tablet TAKE 1 TABLET BY MOUTH DAILY WITH BREAKFAST. 30 tablet 2  . promethazine (PHENERGAN) 12.5 MG tablet Take 1-2 tablets (12.5-25 mg total) by mouth every 8 (eight) hours as needed for nausea or vomiting. 60 tablet 3  . Wheat Dextrin (BENEFIBER) POWD Stir 2 tsp. TID into 4-8 oz of any non-carbonated beverage or soft food (hot or cold) 500 g PRN  .  amoxicillin-clavulanate (AUGMENTIN) 875-125 MG tablet Take 1 tablet by mouth 2 (two) times daily. (Patient not taking: Reported on 03/15/2018) 14 tablet 0  . ezetimibe (ZETIA) 10 MG tablet Take 1 tablet (10 mg total) by mouth daily. (Patient not taking: Reported on 03/15/2018) 90 tablet 3  . metroNIDAZOLE (FLAGYL) 500 MG tablet Take 1 tablet (500 mg total) by mouth 3 (three) times daily. Do not drink alcohol while taking this medicine. (Patient not taking: Reported on 03/15/2018) 21 tablet 0   No current facility-administered medications for this visit.     VITAL SIGNS: BP 127/84 (BP Location: Left Arm)   Pulse 85   Temp (!) 97 F (36.1 C) (Tympanic)   Resp 18   Wt 177 lb 3.2 oz (80.4 kg)   BMI 25.43 kg/m  FA[facility]  03/15/18 1428  Weight: 177 lb 3.2 oz (80.4 kg)    Estimated body mass index is 25.43 kg/m as calculated from the following:   Height as of 03/08/18: '5\' 10"'  (1.778 m).   Weight as of this encounter: 177 lb 3.2 oz (80.4 kg).  LABS: CBC:    Component Value Date/Time   WBC 8.5 02/22/2018 1128   HGB 12.5 (L) 02/22/2018 1128   HGB 14.4 11/03/2013 1619   HCT 38.0 (L) 02/22/2018 1128   HCT 44.2 11/03/2013 1619   PLT 205 02/22/2018 1128   PLT 149 (L) 11/03/2013 1619   MCV 97.2 02/22/2018 1128   MCV 96 11/03/2013 1619   NEUTROABS 5.9 02/22/2018 1128   NEUTROABS 10.7 (H) 11/03/2013 1619   LYMPHSABS 1.5 02/22/2018 1128   LYMPHSABS 2.0 11/03/2013 1619   MONOABS 0.8 02/22/2018 1128   MONOABS 1.3 (H) 11/03/2013 1619   EOSABS 0.1 02/22/2018 1128   EOSABS 0.1 11/03/2013 1619   BASOSABS 0.0 02/22/2018 1128   BASOSABS 0.1 11/03/2013 1619   Comprehensive Metabolic Panel:    Component Value Date/Time   NA 137 02/22/2018 1128   NA 140 07/13/2013 1542   K 4.0 02/22/2018 1128   K 4.3 07/13/2013 1542   CL 102 02/22/2018 1128   CL 107 07/13/2013 1542   CO2 27 02/22/2018 1128   CO2 26 07/13/2013 1542   BUN 14 02/22/2018 1128   BUN 11 07/13/2013 1542   CREATININE 0.67  02/22/2018 1128   CREATININE 0.76 10/15/2017 1001   GLUCOSE 136 (H) 02/22/2018 1128   GLUCOSE 106 (H) 07/13/2013 1542   CALCIUM 9.7 02/22/2018 1128   CALCIUM 9.9 07/13/2013 1542   AST 15 02/22/2018 1128   AST 26 07/13/2013 1542   ALT 10 02/22/2018 1128   ALT 32 07/13/2013 1542   ALKPHOS 45 02/22/2018 1128   ALKPHOS 74 07/13/2013 1542   BILITOT 0.4 02/22/2018 1128   BILITOT 0.4 07/13/2013 1542   PROT 6.5 02/22/2018 1128   PROT 8.1 07/13/2013 1542   ALBUMIN 4.0 02/22/2018 1128   ALBUMIN 4.3 07/13/2013 1542    RADIOGRAPHIC STUDIES: No results found.  PERFORMANCE STATUS (ECOG) : 1 - Symptomatic but completely ambulatory  Review of Systems As noted above. Otherwise, a complete review of systems is negative.  Physical Exam General: NAD, frail appearing, thin Cardiovascular: regular rate and rhythm Pulmonary: clear ant fields Abdomen: soft, nontender, + bowel sounds GU: no suprapubic tenderness Extremities: no edema, no joint deformities Skin: no rashes Neurological: Weakness but otherwise nonfocal  IMPRESSION: I met with patient and wife today in the clinic for routine follow-up regarding pain.  Patient says the pain has generally improved since he was last seen.  He says the pain is now tolerable.  He did have some confusion on how to apply the transdermal fentanyl patches.  He has been applying the 25 mcg patch every 48 hours in the 100 mcg patch every 72 hours.  We talked about both patches being applied with the same frequency.  Despite the irregularity and application, patient feels that the 25 mcg patch being applied every 48 hours has improved his pain.    He has been trying to reduce the amount of Norco he requires.  He has been using Norco twice daily as needed.   Patient denies any other changes or concerns today.  He denies other distressing symptoms.  He generally feels like he is doing well.  He has had some dental issues following a tooth extraction  and has been  referred to an oral surgeon.  I had previously refused a MOST Form and will plan to complete when Rupert Stacks is live.   PLAN: -Continue current scope of treatment -Continue fentanyl patch 180mg Q48H. -Continue Norco for BTP -Continue prednisone daily -Prophylactic bowel regimen -MOST form to be completed at future visit -RTC in 1 month   Patient expressed understanding and was in agreement with this plan. He also understands that He can call clinic at any time with any questions, concerns, or complaints.     Time Total: 15 minutes  Visit consisted of counseling and education dealing with the complex and emotionally intense issues of symptom management and palliative care in the setting of serious and potentially life-threatening illness.Greater than 50%  of this time was spent counseling and coordinating care related to the above assessment and plan.  Signed by: JAltha Harm PhD, NP-C [phone](Work Cell)

## 2018-03-22 ENCOUNTER — Other Ambulatory Visit: Payer: Self-pay | Admitting: *Deleted

## 2018-03-22 DIAGNOSIS — G893 Neoplasm related pain (acute) (chronic): Secondary | ICD-10-CM

## 2018-03-22 DIAGNOSIS — C61 Malignant neoplasm of prostate: Secondary | ICD-10-CM

## 2018-03-22 DIAGNOSIS — C7951 Secondary malignant neoplasm of bone: Principal | ICD-10-CM

## 2018-03-22 MED ORDER — FENTANYL 100 MCG/HR TD PT72: 1.0000 | MEDICATED_PATCH | TRANSDERMAL | 0 refills | Status: DC

## 2018-03-22 MED ORDER — FENTANYL 25 MCG/HR TD PT72: 1.0000 | MEDICATED_PATCH | TRANSDERMAL | 0 refills | Status: DC

## 2018-03-22 NOTE — Telephone Encounter (Signed)
Spoke with patient's wife Caren Griffins. Informed her that Rf on patches has been renewed and sent to pharmacy with #15 total patches. She gave verbal understanding.

## 2018-03-22 NOTE — Telephone Encounter (Signed)
Wife called cancer center. Patient needs RF on Duragesic but he wants a months supply. Patient currently using 125 mcg every 48 hours.  He is requesting #20 instead of # 10. Patient will place his last patch on Sunday.

## 2018-04-10 ENCOUNTER — Telehealth: Payer: Self-pay | Admitting: Pharmacist

## 2018-04-10 NOTE — Telephone Encounter (Signed)
Oral Oncology Pharmacist Encounter   Received notification from [facility] that a reauthorizations for Robert Short is required from [place].   PA submitted on CMM Key [id number] Status is pending   Oral Oncology Clinic will continue to follow.   Darl Pikes, PharmD, BCPS, [facility] Hematology/Oncology Clinical Pharmacist [facility] [place] [facility] [phone]  04/10/2018 10:40 AM

## 2018-04-11 NOTE — Telephone Encounter (Signed)
Oral Oncology Pharmacist Encounter   Prior Authorization for [facility]y has been approved.     PA# [id number] Effective dates: 04/10/2018 through 04/09/2019   Oral Oncology Clinic will continue to follow.   Darl Pikes, PharmD, BCPS. [facility] Hematology/Oncology Clinical Pharmacist [facility] Oral [facility]otherapy [facility] [phone]  04/11/2018 9:14 AM

## 2018-04-15 ENCOUNTER — Other Ambulatory Visit: Payer: Self-pay | Admitting: *Deleted

## 2018-04-15 DIAGNOSIS — C61 Malignant neoplasm of prostate: Secondary | ICD-10-CM

## 2018-04-15 DIAGNOSIS — C7951 Secondary malignant neoplasm of bone: Principal | ICD-10-CM

## 2018-04-15 MED ORDER — HYDROCODONE-ACETAMINOPHEN 10-325 MG PO TABS: 1.0000 | ORAL_TABLET | Freq: Three times a day (TID) | ORAL | 0 refills | Status: DC | PRN

## 2018-04-16 ENCOUNTER — Other Ambulatory Visit: Payer: Self-pay

## 2018-04-16 DIAGNOSIS — R7303 Prediabetes: Secondary | ICD-10-CM

## 2018-04-16 MED FILL — XTANDI 40 MG CAPSULE: 40 | 28 days supply | Qty: 84 | Fill #2

## 2018-04-18 ENCOUNTER — Other Ambulatory Visit
Admission: RE | Admit: 2018-04-18 | Discharge: 2018-04-18 | Disposition: A | Discharge status: Home / Self Care | Payer: 59 | Attending: Family Medicine | Admitting: Family Medicine

## 2018-04-18 ENCOUNTER — Other Ambulatory Visit: Payer: Self-pay | Admitting: Nurse Practitioner

## 2018-04-18 DIAGNOSIS — R7303 Prediabetes: Secondary | ICD-10-CM | POA: Insufficient documentation

## 2018-04-18 LAB — HEMOGLOBIN A1C
Hgb A1c MFr Bld: 6.2 % — ABNORMAL HIGH (ref 4.8–5.6)
Mean Plasma Glucose: 131.24 mg/dL

## 2018-04-19 ENCOUNTER — Ambulatory Visit: Payer: 59 | Admitting: Family Medicine

## 2018-04-19 ENCOUNTER — Other Ambulatory Visit: Payer: Self-pay | Admitting: Family Medicine

## 2018-04-19 ENCOUNTER — Other Ambulatory Visit: Payer: Self-pay

## 2018-04-19 ENCOUNTER — Encounter: Payer: Self-pay | Admitting: Family Medicine

## 2018-04-19 ENCOUNTER — Ambulatory Visit (INDEPENDENT_AMBULATORY_CARE_PROVIDER_SITE_OTHER): Payer: 59 | Admitting: Family Medicine

## 2018-04-19 DIAGNOSIS — R7303 Prediabetes: Secondary | ICD-10-CM | POA: Diagnosis not present

## 2018-04-19 DIAGNOSIS — I10 Essential (primary) hypertension: Secondary | ICD-10-CM

## 2018-04-19 DIAGNOSIS — Z0000 Encounter for general adult medical examination without abnormal findings: Secondary | ICD-10-CM

## 2018-04-19 NOTE — Patient Instructions (Addendum)
Recent Labs    10/15/17 1001 04/18/18 1443  HGBA1C 5.8* 6.2*    Mild elevated blood sugar again - but still better than 1 year ago - keep improving lifestyle - diet  DUE for FASTING BLOOD WORK (no food or drink after midnight before the lab appointment, only water or coffee without cream/sugar on the morning of)  SCHEDULE "Lab Only" visit in the morning at the clinic for lab draw in 6 MONTHS   - Make sure Lab Only appointment is at about 1 week before your next appointment, so that results will be available  For Lab Results, once available within 2-3 days of blood draw, you can can log in to MyChart online to view your results and a brief explanation. Also, we can discuss results at next follow-up visit.   Please schedule a Follow-up Appointment to: Return in about 6 months (around 10/19/2018) for Annual Physical.  If you have any other questions or concerns, please feel free to call the office or send a message through [facility]. You may also schedule an earlier appointment if necessary.  Additionally, you may be receiving a survey about your experience at our office within a few days to 1 week by e-mail or mail. We value your feedback.  Nobie Putnam, DO [place]

## 2018-04-19 NOTE — Assessment & Plan Note (Signed)
Mild elevated glucose A1c up to 6.2, previously improved to 5.8 - still overall improved from 1 year ago 6.4 Improved lifestyle  Plan:  1. Not on any therapy currently  2. Encourage improved lifestyle - low carb, low sugar diet, reduce portion size, continue improving regular exercise 3. Follow-up q 6 mo annual w/ labs

## 2018-04-19 NOTE — Progress Notes (Signed)
Virtual Visit via Telephone The purpose of this virtual visit is to provide medical care while limiting exposure to the novel coronavirus (COVID19) for both patient and office staff.  Consent was obtained for phone visit:  Yes.   Answered questions that patient had about telehealth interaction:  Yes.   I discussed the limitations, risks, security and privacy concerns of performing an evaluation and management service by telephone. I also discussed with the patient that there may be a patient responsible charge related to this service. The patient expressed understanding and agreed to proceed.  Patient Location: Home Provider Location: [facility])  ---------------------------------------------------------------------- Chief Complaint  Patient presents with  . Pre-Diabetes  . Hypertension    S: Reviewed CMA documentation. I have called patient and gathered additional HPI as follows:  Specialists: Oncology - Dr Charlaine Dalton ([place]) Cardiology - Dr Ida Rogue [facility])  Here for Annual Physical and Lab.  Pre-Diabetes: Previous A1c trend elevated 6 up to 6.4, then improved to 5.8, now back up to 6.2 on last check CBGs:None Meds:None - continues on chronic prednisone 60m daily Currently off ACEi Lifestyle: - Diet (stable diet without major changes, limited due to dental lack of teeth right now - on soft diet) - Exercise (still improved exercise and activity now, he tries to push himself more, and has more energy) Denies hypoglycemia  CHRONIC HTN Home BP normal. Without elevated or low BP. Still following Cardiology. Current Meds - Metoprolol 12.577mBID (half of 255mab) Reports good compliance, took meds today. Tolerating well, w/o complaints. Denies CP, dyspnea, HA, edema, dizziness / lightheadedness  Dental Infection Followed by Oral Surgeon, now on repeat course antibiotics.  Denies any high risk travel to areas  of current concern for COVID19. Denies any known or suspected exposure to person with or possibly with COVID19.  Denies any fevers, chills, sweats, body ache, cough, shortness of breath, sinus pain or pressure, headache, abdominal pain, diarrhea  Past Medical History:  Diagnosis Date  . Back pain 10/09/2012  . [place] . CAD (coronary artery disease)    a. 08/2015 PCI: LM nl, LAD 20d, LCX nl, RCA 43m78mAV 95 (3.0x18 Xience Alpine DES);  b. [facility]2.75[facility]8 Promus Premier DES).  . Cancer associated pain   . Depression   . History of echocardiogram    a. 08/2016 Echo: EF 50-55%.  . HiMarland Kitchentory of kidney stones   . Hyperlipidemia   . Hypertension   . Joint pain   . [facility].  s/p prostatectomy ([facility]);  b. bone mets noted 04/2016.  . Right arm pain 01/10/2016  . Right foot pain 01/10/2016  . Right leg pain 01/10/2016  . Tobacco abuse    Social History   Tobacco Use  . Smoking status: Current Every Day Smoker    Packs/day: 0.50    Years: 40.00    Pack years: 20.00    Types: Cigarettes  . Smokeless tobacco: Current User    Types: Chew  . Tobacco comment: 1 pack every couple days   Substance Use Topics  . Alcohol use: Yes    Frequency: Never    Comment: occasionally  . Drug use: No    Current Outpatient Medications:  .  acetaminophen (TYLENOL) 500 MG tablet, Take 1,000 mg by mouth every 8 (eight) hours as needed. , Disp: , Rfl:  .  albuterol (PROVENTIL HFA;VENTOLIN HFA) 108 (90 Base) MCG/ACT  inhaler, Inhale 1-2 puffs into the lungs every 6 (six) hours as needed for wheezing or shortness of breath., Disp: 1 Inhaler, Rfl: 0 .  amoxicillin-clavulanate (AUGMENTIN) 875-125 MG tablet, Take 1 tablet by mouth 2 (two) times daily., Disp: 14 tablet, Rfl: 0 .  aspirin 81 MG tablet, Take 81 mg by mouth daily., Disp: , Rfl:  .  atorvastatin (LIPITOR) 80 MG tablet, TAKE 1 TABLET (80 MG TOTAL) BY MOUTH DAILY AT 6 PM., Disp: 90 tablet, Rfl: 1 .   clopidogrel (PLAVIX) 75 MG tablet, TAKE 1 TABLET BY MOUTH DAILY, Disp: 90 tablet, Rfl: 0 .  enzalutamide (XTANDI) 40 MG capsule, Take 3 capsules (120 mg total) by mouth daily., Disp: 84 capsule, Rfl: 3 .  ezetimibe (ZETIA) 10 MG tablet, Take 1 tablet (10 mg total) by mouth daily., Disp: 90 tablet, Rfl: 3 .  fentaNYL (DURAGESIC) 100 MCG/HR, Place 1 patch onto the skin every other day. Along with 31mg patch for total of 125 mcg every 2 days. (Patient taking differently: Place 1 patch onto the skin every other day. Along with 280m patch for total of 125 mcg every 2 days.), Disp: 15 patch, Rfl: 0 .  fentaNYL (DURAGESIC) 25 MCG/HR, Place 1 patch onto the skin every other day. Use this along with fentanyl patch 100 mcg [total 125 mcg] every 2 days, Disp: 15 patch, Rfl: 0 .  gabapentin (NEURONTIN) 100 MG capsule, START 1 CAPSULE DAILY, INCREASE BY 1 CAP EVERY 2-3 DAYS AS TOLERATED UP TO 3 TIMES A DAY, OR MAY TAKE 3 AT ONCE IN EVENING., Disp: 90 capsule, Rfl: 5 .  HYDROcodone-acetaminophen (NORCO) 10-325 MG tablet, Take 1 tablet by mouth every 8 (eight) hours as needed., Disp: 90 tablet, Rfl: 0 .  metoprolol tartrate (LOPRESSOR) 25 MG tablet, Take 0.5 tablets (12.5 mg total) by mouth 2 (two) times daily., Disp: 90 tablet, Rfl: 1 .  promethazine (PHENERGAN) 12.5 MG tablet, Take 1-2 tablets (12.5-25 mg total) by mouth every 8 (eight) hours as needed for nausea or vomiting., Disp: 60 tablet, Rfl: 3 .  Wheat Dextrin (BENEFIBER) POWD, Stir 2 tsp. TID into 4-8 oz of any non-carbonated beverage or soft food (hot or cold), Disp: 500 g, Rfl: PRN .  metroNIDAZOLE (FLAGYL) 500 MG tablet, Take 1 tablet (500 mg total) by mouth 3 (three) times daily. Do not drink alcohol while taking this medicine. (Patient not taking: Reported on 03/15/2018), Disp: 21 tablet, Rfl: 0 .  naloxone (NARCAN) nasal spray 4 mg/0.1 mL, 1 spray into nostril upon signs of opioid overdose. Call 911. May repeat once if no response within 2-3 minutes.  (Patient not taking: Reported on 04/19/2018), Disp: 1 kit, Rfl: 0 .  nitroGLYCERIN (NITROSTAT) 0.4 MG SL tablet, Place 0.4 mg under the tongue every 5 (five) minutes as needed. , Disp: , Rfl:  .  polyethylene glycol (MIRALAX / GLYCOLAX) packet, Take 17 g by mouth daily as needed for moderate constipation., Disp: , Rfl:   Dep[facility]9 04/19/2018 03/08/2018 02/26/2018  Decreased Interest 0 0 0  Down, Depressed, Hopeless 0 0 0  PHQ - 2 Score 0 0 0  Altered sleeping - - -  Tired, decreased energy - - -  Change in appetite - - -  Feeling bad or failure about yourself  - - -  Trouble concentrating - - -  Moving slowly or fidgety/restless - - -  Suicidal thoughts - - -  PHQ-9 Score - - -  Difficult doing work/chores - - -  Some recent data might be hidden    No flowsheet data found.  -------------------------------------------------------------------------- O: No physical exam performed due to remote telephone encounter.  Lab results reviewed.  Recent Labs    10/15/17 1001 04/18/18 1443  HGBA1C 5.8* 6.2*    Recent Results (from the past 2160 hour(s))  PSA     Status: Abnormal   Collection Time: 02/22/18 11:28 AM  Result Value Ref Range   Prostatic Specific Antigen 32.47 (H) 0.00 - 4.00 ng/mL    Comment: (NOTE) While PSA levels of <=4.0 ng/ml are reported as reference range, some men with levels below 4.0 ng/ml can have prostate cancer and many men with PSA above 4.0 ng/ml do not have prostate cancer.  Other tests such as free PSA, age specific reference ranges, PSA velocity and PSA doubling time may be helpful especially in men less than 43 years old. Performed at [facility], [place] [address] [place], [place] [address]   CBC with Differential     Status: Abnormal   Collection Time: 02/22/18 11:28 AM  Result Value Ref Range   WBC 8.5 4.0 - 10.5 K/uL   RBC 3.91 (L) 4.22 - 5.81 MIL/uL   Hemoglobin 12.5 (L) 13.0 - 17.0 g/dL   HCT 38.0 (L) 39.0 - 52.0 %    MCV 97.2 80.0 - 100.0 fL   MCH 32.0 26.0 - 34.0 pg   MCHC 32.9 30.0 - 36.0 g/dL   RDW 13.8 11.5 - 15.5 %   Platelets 205 150 - 400 K/uL   nRBC 0.0 0.0 - 0.2 %   Neutrophils Relative % 69 %   Neutro Abs 5.9 1.7 - 7.7 K/uL   Lymphocytes Relative 18 %   Lymphs Abs 1.5 0.7 - 4.0 K/uL   Monocytes Relative 10 %   Monocytes Absolute 0.8 0.1 - 1.0 K/uL   Eosinophils Relative 1 %   Eosinophils Absolute 0.1 0.0 - 0.5 K/uL   Basophils Relative 1 %   Basophils Absolute 0.0 0.0 - 0.1 K/uL   Immature Granulocytes 1 %   Abs Immature Granulocytes 0.06 0.00 - 0.07 K/uL    Comment: Performed at [facility], [address], [place], [place] [address]  Comprehensive metabolic panel     Status: Abnormal   Collection Time: 02/22/18 11:28 AM  Result Value Ref Range   Sodium 137 135 - 145 mmol/L   Potassium 4.0 3.5 - 5.1 mmol/L   Chloride 102 98 - 111 mmol/L   CO2 27 22 - 32 mmol/L   Glucose, Bld 136 (H) 70 - 99 mg/dL   BUN 14 8 - 23 mg/dL   Creatinine, Ser 0.67 0.61 - 1.24 mg/dL   Calcium 9.7 8.9 - 10.3 mg/dL   Total Protein 6.5 6.5 - 8.1 g/dL   Albumin 4.0 3.5 - 5.0 g/dL   AST 15 15 - 41 U/L   ALT 10 0 - 44 U/L   Alkaline Phosphatase 45 38 - 126 U/L   Total Bilirubin 0.4 0.3 - 1.2 mg/dL   GFR calc non Af Amer >60 >60 mL/min   GFR calc Af Amer >60 >60 mL/min   Anion gap 8 5 - 15    Comment: Performed at [facility], [address] [place], [place] [address]  Hemoglobin A1c     Status: Abnormal   Collection Time: 04/18/18  2:43 PM  Result Value Ref Range   Hgb A1c MFr Bld 6.2 (H) 4.8 - 5.6 %  Comment: (NOTE) Pre diabetes:          5.7%-6.4% Diabetes:              >6.4% Glycemic control for   <7.0% adults with diabetes    Mean Plasma Glucose 131.24 mg/dL    Comment: Performed at [place] [address], [place], [place] [address]    -------------------------------------------------------------------------- A&P:  Problem List Items Addressed  This Visit    Essential (primary) hypertension    Controlled HTN, prior low BP in past - improved - Home BP readings improved Complication with CAD, prostate CA Followed by Cardiology Off Lisinopril 2.27m    Plan:  1. Continue low dose BP medication - Metoprolol 12.563mBID (half tab 25) 2. Encourage improved lifestyle - low sodium diet, regular exercise as tolerated 3. Continue monitor BP outside office, bring readings to next visit, if persistently >140/90 or new symptoms notify office sooner 4. Follow-up 6 months      Pre-diabetes - Primary    Mild elevated glucose A1c up to 6.2, previously improved to 5.8 - still overall improved from 1 year ago 6.4 Improved lifestyle  Plan:  1. Not on any therapy currently  2. Encourage improved lifestyle - low carb, low sugar diet, reduce portion size, continue improving regular exercise 3. Follow-up q 6 mo annual w/ labs         No orders of the defined types were placed in this encounter.   Follow-up: - Return in 6 months for Annual Physical - Future labs ordered for 10/14/18  Patient verbalizes understanding with the above medical recommendations including the limitation of remote medical advice.  Specific follow-up and call-back criteria were given for patient to follow-up or seek medical care more urgently if needed.   - Time spent in direct consultation with patient on phone: 8 minutes  AlNobie PutnamDOHoweroup 04/19/2018, 10:49 AM

## 2018-04-19 NOTE — Assessment & Plan Note (Addendum)
Controlled HTN, prior low BP in past - improved - Home BP readings improved Complication with CAD, prostate CA Followed by Cardiology Off Lisinopril 2.5mg     Plan:  1. Continue low dose BP medication - Metoprolol 12.5mg  BID (half tab 25) 2. Encourage improved lifestyle - low sodium diet, regular exercise as tolerated 3. Continue monitor BP outside office, bring readings to next visit, if persistently >140/90 or new symptoms notify office sooner 4. Follow-up 6 months

## 2018-04-21 ENCOUNTER — Other Ambulatory Visit: Payer: Self-pay

## 2018-04-22 ENCOUNTER — Other Ambulatory Visit: Payer: Self-pay

## 2018-04-22 ENCOUNTER — Inpatient Hospital Stay: Payer: 59 | Attending: Internal Medicine

## 2018-04-22 DIAGNOSIS — Z79899 Other long term (current) drug therapy: Secondary | ICD-10-CM | POA: Insufficient documentation

## 2018-04-22 DIAGNOSIS — C7951 Secondary malignant neoplasm of bone: Secondary | ICD-10-CM | POA: Diagnosis not present

## 2018-04-22 DIAGNOSIS — F329 Major depressive disorder, single episode, unspecified: Secondary | ICD-10-CM | POA: Insufficient documentation

## 2018-04-22 DIAGNOSIS — C61 Malignant neoplasm of prostate: Secondary | ICD-10-CM | POA: Insufficient documentation

## 2018-04-22 DIAGNOSIS — F419 Anxiety disorder, unspecified: Secondary | ICD-10-CM | POA: Diagnosis not present

## 2018-04-22 DIAGNOSIS — G893 Neoplasm related pain (acute) (chronic): Secondary | ICD-10-CM | POA: Diagnosis not present

## 2018-04-22 LAB — CBC WITH DIFFERENTIAL/PLATELET
Abs Immature Granulocytes: 0.05 10*3/uL (ref 0.00–0.07)
Basophils Absolute: 0 10*3/uL (ref 0.0–0.1)
Basophils Relative: 0 %
Eosinophils Absolute: 0 10*3/uL (ref 0.0–0.5)
Eosinophils Relative: 0 %
HCT: 38.9 % — ABNORMAL LOW (ref 39.0–52.0)
Hemoglobin: 12.6 g/dL — ABNORMAL LOW (ref 13.0–17.0)
Immature Granulocytes: 1 %
Lymphocytes Relative: 17 %
Lymphs Abs: 1.3 10*3/uL (ref 0.7–4.0)
MCH: 31.3 pg (ref 26.0–34.0)
MCHC: 32.4 g/dL (ref 30.0–36.0)
MCV: 96.5 fL (ref 80.0–100.0)
Monocytes Absolute: 0.5 10*3/uL (ref 0.1–1.0)
Monocytes Relative: 6 %
Neutro Abs: 5.9 10*3/uL (ref 1.7–7.7)
Neutrophils Relative %: 76 %
Platelets: 210 10*3/uL (ref 150–400)
RBC: 4.03 MIL/uL — ABNORMAL LOW (ref 4.22–5.81)
RDW: 13.7 % (ref 11.5–15.5)
WBC: 7.7 10*3/uL (ref 4.0–10.5)
nRBC: 0 % (ref 0.0–0.2)

## 2018-04-22 LAB — COMPREHENSIVE METABOLIC PANEL
ALT: 11 U/L (ref 0–44)
AST: 16 U/L (ref 15–41)
Albumin: 4 g/dL (ref 3.5–5.0)
Alkaline Phosphatase: 41 U/L (ref 38–126)
Anion gap: 11 (ref 5–15)
BUN: 15 mg/dL (ref 8–23)
CO2: 25 mmol/L (ref 22–32)
Calcium: 9.4 mg/dL (ref 8.9–10.3)
Chloride: 103 mmol/L (ref 98–111)
Creatinine, Ser: 0.82 mg/dL (ref 0.61–1.24)
GFR calc Af Amer: >60 mL/min (ref >60)
GFR calc non Af Amer: >60 mL/min (ref >60)
Glucose, Bld: 138 mg/dL — ABNORMAL HIGH (ref 70–99)
Potassium: 4.7 mmol/L (ref 3.5–5.1)
Sodium: 139 mmol/L (ref 135–145)
Total Bilirubin: 0.4 mg/dL (ref 0.3–1.2)
Total Protein: 7.1 g/dL (ref 6.5–8.1)

## 2018-04-22 LAB — PSA: Prostatic Specific Antigen: 45.04 ng/mL — ABNORMAL HIGH (ref 0.00–4.00)

## 2018-04-23 ENCOUNTER — Telehealth: Payer: Self-pay | Admitting: *Deleted

## 2018-04-23 ENCOUNTER — Other Ambulatory Visit: Payer: Self-pay | Admitting: Hospice and Palliative Medicine

## 2018-04-23 DIAGNOSIS — C61 Malignant neoplasm of prostate: Secondary | ICD-10-CM

## 2018-04-23 DIAGNOSIS — G893 Neoplasm related pain (acute) (chronic): Secondary | ICD-10-CM

## 2018-04-23 DIAGNOSIS — C7951 Secondary malignant neoplasm of bone: Principal | ICD-10-CM

## 2018-04-23 MED ORDER — FENTANYL 100 MCG/HR TD PT72: 1.0000 | MEDICATED_PATCH | TRANSDERMAL | 0 refills | Status: DC

## 2018-04-23 MED ORDER — FENTANYL 25 MCG/HR TD PT72: 1.0000 | MEDICATED_PATCH | TRANSDERMAL | 0 refills | Status: DC

## 2018-04-23 NOTE — Telephone Encounter (Signed)
Thanks. I did send refills.

## 2018-04-23 NOTE — Progress Notes (Signed)
Refill requested by wife for fentanyl. PDMP reviewed.   Refill sent for fentanyl 75mcg and 194mcg patches Q48H, #15

## 2018-04-23 NOTE — Telephone Encounter (Signed)
Received a msg from patient's wife. Calling to rf fentanyl.  I contacted the patient's wife. She stated that she already spoke with [facility] and he will renew the patches. I did reiterate that for RFs to have her contact triage for future RFs so that call is not delayed.

## 2018-04-26 ENCOUNTER — Other Ambulatory Visit: Payer: Self-pay

## 2018-04-29 ENCOUNTER — Other Ambulatory Visit: Payer: Self-pay

## 2018-04-29 ENCOUNTER — Inpatient Hospital Stay (HOSPITAL_BASED_OUTPATIENT_CLINIC_OR_DEPARTMENT_OTHER): Payer: 59 | Admitting: Internal Medicine

## 2018-04-29 ENCOUNTER — Encounter: Payer: Self-pay | Admitting: Internal Medicine

## 2018-04-29 ENCOUNTER — Inpatient Hospital Stay (HOSPITAL_BASED_OUTPATIENT_CLINIC_OR_DEPARTMENT_OTHER): Payer: 59 | Admitting: Hospice and Palliative Medicine

## 2018-04-29 DIAGNOSIS — C61 Malignant neoplasm of prostate: Secondary | ICD-10-CM | POA: Diagnosis not present

## 2018-04-29 DIAGNOSIS — C7951 Secondary malignant neoplasm of bone: Secondary | ICD-10-CM

## 2018-04-29 DIAGNOSIS — G893 Neoplasm related pain (acute) (chronic): Secondary | ICD-10-CM | POA: Diagnosis not present

## 2018-04-29 DIAGNOSIS — F419 Anxiety disorder, unspecified: Secondary | ICD-10-CM

## 2018-04-29 DIAGNOSIS — F32A Depression, unspecified: Secondary | ICD-10-CM

## 2018-04-29 DIAGNOSIS — F329 Major depressive disorder, single episode, unspecified: Secondary | ICD-10-CM

## 2018-04-29 DIAGNOSIS — Z515 Encounter for palliative care: Secondary | ICD-10-CM

## 2018-04-29 MED ORDER — ESCITALOPRAM OXALATE 10 MG PO TABS: 5.0000 mg | ORAL_TABLET | Freq: Every day | ORAL | 0 refills | Status: DC

## 2018-04-29 NOTE — Assessment & Plan Note (Addendum)
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 Lupron every 3 months [11/06/17].rising worsening.   # on Xtandi 3 pills a day; currently PSA- 45. Rising.  Continue the same.  # Depression/ anxiety-severe.However, reluctant to try any medications.  Discussed with [name].  Okay with referral to Dr. Delrae Sawyers counseling/antidepressants.  [name] is going to speak with the patient today.  I have asked the patient wife to call EMS/go to nearest emergency room with suicidal ideation.  # Bone mets-hold Xgeva; last given on September 25, 2017. Hold off now.  # Malignancy related pain- continue fentanyl patch hydrocodone; STABLE. Continue fentanyl patch to every 48 hours and continue hydrocodone.  # DISPOSITION: #Follow-up visit in 2 weeks-MD-clinic; no labs-Dr.B

## 2018-04-29 NOTE — Progress Notes (Deleted)
Virtual Visit via Telephone Note  I connected with Kwabena Strutz Meara on 04/30/18 at  2:00 PM EDT by telephone and verified that I am speaking with the correct person using two identifiers.   I discussed the limitations, risks, security and privacy concerns of performing an evaluation and management service by telephone and the availability of in person appointments. I also discussed with the patient that there may be a patient responsible charge related to this service. The patient expressed understanding and agreed to proceed.  History of Present Illness: Palliative Care consult requested for this 68 y.o. male with multiple medical problems including stage IV prostate cancer metastatic to bone currently on Xtandi. Patient has had rising PSA levels on treatment.  Patient has had multiple symptoms including fatigue and pain.  He was referred to palliative care to help with symptom management and to address goals.   Observations/Objective: Case discussed with Dr. Rogue Bussing regarding patient's reported depression and intermittent suicidality.  I called and spoke with both patient and his wife via telephone.  Patient says that he is facing a serious legal matter but would not divulge the specifics.  He is fearful of facing prison time and says that he would end his life before that happened.  He denies being actively suicidal at the time of our conversation but says that he has been thinking about it recently.  He denies having an active method.  Patient owns a black rifle and I strongly recommended to wife that she remove it from the home.  Patient contracts for safety during our conversation.  He agreed that he would speak with his wife and seek help in the ER should he feel suicidal.  Patient endorses severe depression.  He is sleeping much of the day and has little energy to do activities he once found enjoyable.  Appetite is reportedly stable.  Patient does have some periods where he is waking at night  feeling panicked and anxious.  Patient has been treated on antidepressants in the past including Paxil and Effexor.  He says that he did not like the way that those medications made him feel and that he felt relatively sensitive to antidepressants.  Patient is in agreement with referral to psychiatry, counseling, and starting an antidepressant.  Case and plan discussed with Dr. Rogue Bussing.  Assessment and Plan: -Referral to psychiatry -Referral to counseling (spoke with Janeann Merl, LCSW, who will call patient) -Will start Lexapro 5 mg daily given reported sensitivity to antidepressants  Follow Up Instructions: -Call 911 in the event of feeling suicidal   I discussed the assessment and treatment plan with the patient. The patient was provided an opportunity to ask questions and all were answered. The patient agreed with the plan and demonstrated an understanding of the instructions.   The patient was advised to call back or seek an in-person evaluation if the symptoms worsen or if the condition fails to improve as anticipated.  I provided 30 minutes of non-face-to-face time during this encounter.   Irean Hong, NP

## 2018-04-29 NOTE — Progress Notes (Signed)
I connected with Robert Short on 04/29/18 at  1:45 PM EDT by telephone visit and verified that I am speaking with the correct person using two identifiers.  I discussed the limitations, risks, security and privacy concerns of performing an evaluation and management service by telemedicine and the availability of in-person appointments. I also discussed with the patient that there may be a patient responsible charge related to this service. The patient expressed understanding and agreed to proceed.    Other persons participating in the visit and their role in the encounter: wife  Patient's location: home  Provider's location: home   Oncology History   # 2011- PROSTATE CANCER [Gleason 3+4]; s/p Prostatectomy [ also involved bladder neck/ECP; Dr.Polaseck]; July 2014- Biochemical recurrence [PSA 14]- started on Zoladex [Dr.Pandit]; lost to follow up.  # JAN 2017- STAGE IV METASTATIC PROSTATE Cancer to Bone- Feb 13th, 2017-  Lupron q 41m [~end of feb]; PSA: 1021; Declined Chemo; April 2017 [xofigo x6; Dr.Crystal]; AUG 2017- Zytiga + Prednisone BID. Bone scan-Jan 2018- improved skeletal metastases.   # MAY 1st 2019- START X-tandi [stopped zytiga/PSA- 8.8/rising]  # Mets to bone- start X-geva q 50M [May 30th]  # Smoker/ chronic pain/pain clinic   #[April 2018; [place]]- s/p stenting; on Plavix; --------------------------------------------------------------  DIAGNOSIS: [ ]  Castrate resistant prostate cancer  STAGE:   4    ;GOALS: Palliative  CURRENT/MOST RECENT THERAPY [April 2019]-Xtandi      Prostate cancer metastatic to bone ([place])   12/08/2014 Initial Diagnosis    Prostate cancer metastatic to bone [facility])      Chief Complaint: prostate cancer  History of present illness:Robert Short 68 y.o.  male with history of castrate resistant prostate cancer currently Xtandi.  Patient states that he is currently in legal trouble which is confidential.  Unwilling to divulge.  He states that  he is currently going through significant stress/anxiety.  He is depressed.  States that he had episodes of suicidal ideation given the stress.  Observation/objective: PSA rising 45.  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 Lupron every 3 months [11/06/17].rising worsening.   # on Xtandi 3 pills a day; currently PSA- 45. Rising.  Continue the same.  # Depression/ anxiety-severe.However, reluctant to try any medications.  Discussed with [name].  Okay with referral to Dr. Delrae Sawyers counseling/antidepressants.  Robert Short is going to speak with the patient today.  I have asked the patient wife to call EMS/go to nearest emergency room with suicidal ideation.  # Bone mets-hold Xgeva; last given on September 25, 2017. Hold off now.  # Malignancy related pain- continue fentanyl patch hydrocodone; STABLE. Continue fentanyl patch to every 48 hours and continue hydrocodone.  # DISPOSITION: #Follow-up visit in 2 weeks-MD-clinic; no labs-Dr.B    Follow-up instructions:  I discussed the assessment and treatment plan with the patient.  The patient was provided an opportunity to ask questions and all were answered.  The patient agreed with the plan and demonstrated understanding of instructions.  The patient was advised to call back or seek an in person evaluation if the symptoms worsen or if the condition fails to improve as anticipated.  I provided 25 minutes of non face-to-face telephone visit time during this encounter, and > 50% was spent counseling as documented under my assessment & plan.   Dr. Charlaine Dalton [place] at [facility] 04/29/2018 2:52 PM

## 2018-04-30 ENCOUNTER — Inpatient Hospital Stay
Admission: AD | Admit: 2018-04-30 | Discharge: 2018-05-01 | DRG: 885 | Disposition: A | Discharge status: Home / Self Care | Payer: 59 | Source: Intra-hospital | Attending: Psychiatry | Admitting: Psychiatry

## 2018-04-30 ENCOUNTER — Inpatient Hospital Stay (HOSPITAL_BASED_OUTPATIENT_CLINIC_OR_DEPARTMENT_OTHER): Payer: 59 | Admitting: Hospice and Palliative Medicine

## 2018-04-30 ENCOUNTER — Encounter: Payer: Self-pay | Admitting: Emergency Medicine

## 2018-04-30 ENCOUNTER — Emergency Department
Admission: EM | Admit: 2018-04-30 | Discharge: 2018-04-30 | Disposition: A | Discharge status: Other Acute Inpatient Hospital | Payer: 59 | Attending: Emergency Medicine | Admitting: Emergency Medicine

## 2018-04-30 ENCOUNTER — Encounter: Payer: Self-pay | Admitting: Behavioral Health

## 2018-04-30 ENCOUNTER — Other Ambulatory Visit: Payer: Self-pay

## 2018-04-30 DIAGNOSIS — F32A Depression, unspecified: Secondary | ICD-10-CM

## 2018-04-30 DIAGNOSIS — Z79891 Long term (current) use of opiate analgesic: Secondary | ICD-10-CM

## 2018-04-30 DIAGNOSIS — F329 Major depressive disorder, single episode, unspecified: Secondary | ICD-10-CM | POA: Insufficient documentation

## 2018-04-30 DIAGNOSIS — F4323 Adjustment disorder with mixed anxiety and depressed mood: Secondary | ICD-10-CM | POA: Diagnosis not present

## 2018-04-30 DIAGNOSIS — Z888 Allergy status to other drugs, medicaments and biological substances status: Secondary | ICD-10-CM

## 2018-04-30 DIAGNOSIS — T1491XA Suicide attempt, initial encounter: Secondary | ICD-10-CM | POA: Diagnosis not present

## 2018-04-30 DIAGNOSIS — F419 Anxiety disorder, unspecified: Secondary | ICD-10-CM | POA: Diagnosis present

## 2018-04-30 DIAGNOSIS — F322 Major depressive disorder, single episode, severe without psychotic features: Secondary | ICD-10-CM | POA: Diagnosis present

## 2018-04-30 DIAGNOSIS — M25519 Pain in unspecified shoulder: Secondary | ICD-10-CM

## 2018-04-30 DIAGNOSIS — M545 Low back pain, unspecified: Secondary | ICD-10-CM | POA: Diagnosis present

## 2018-04-30 DIAGNOSIS — M792 Neuralgia and neuritis, unspecified: Secondary | ICD-10-CM | POA: Diagnosis present

## 2018-04-30 DIAGNOSIS — Z955 Presence of coronary angioplasty implant and graft: Secondary | ICD-10-CM | POA: Diagnosis not present

## 2018-04-30 DIAGNOSIS — Z79899 Other long term (current) drug therapy: Secondary | ICD-10-CM | POA: Diagnosis not present

## 2018-04-30 DIAGNOSIS — G629 Polyneuropathy, unspecified: Secondary | ICD-10-CM | POA: Diagnosis present

## 2018-04-30 DIAGNOSIS — C61 Malignant neoplasm of prostate: Secondary | ICD-10-CM | POA: Diagnosis present

## 2018-04-30 DIAGNOSIS — G4700 Insomnia, unspecified: Secondary | ICD-10-CM | POA: Diagnosis present

## 2018-04-30 DIAGNOSIS — M542 Cervicalgia: Secondary | ICD-10-CM

## 2018-04-30 DIAGNOSIS — D8989 Other specified disorders involving the immune mechanism, not elsewhere classified: Secondary | ICD-10-CM | POA: Diagnosis not present

## 2018-04-30 DIAGNOSIS — I10 Essential (primary) hypertension: Secondary | ICD-10-CM | POA: Diagnosis present

## 2018-04-30 DIAGNOSIS — Z8249 Family history of ischemic heart disease and other diseases of the circulatory system: Secondary | ICD-10-CM

## 2018-04-30 DIAGNOSIS — M47812 Spondylosis without myelopathy or radiculopathy, cervical region: Secondary | ICD-10-CM | POA: Diagnosis present

## 2018-04-30 DIAGNOSIS — G893 Neoplasm related pain (acute) (chronic): Secondary | ICD-10-CM | POA: Diagnosis present

## 2018-04-30 DIAGNOSIS — Z87442 Personal history of urinary calculi: Secondary | ICD-10-CM

## 2018-04-30 DIAGNOSIS — G9332 Myalgic encephalomyelitis/chronic fatigue syndrome: Secondary | ICD-10-CM | POA: Diagnosis present

## 2018-04-30 DIAGNOSIS — R45851 Suicidal ideations: Secondary | ICD-10-CM

## 2018-04-30 DIAGNOSIS — F17220 Nicotine dependence, chewing tobacco, uncomplicated: Secondary | ICD-10-CM | POA: Diagnosis not present

## 2018-04-30 DIAGNOSIS — Z515 Encounter for palliative care: Secondary | ICD-10-CM | POA: Diagnosis not present

## 2018-04-30 DIAGNOSIS — E785 Hyperlipidemia, unspecified: Secondary | ICD-10-CM | POA: Diagnosis present

## 2018-04-30 DIAGNOSIS — I2510 Atherosclerotic heart disease of native coronary artery without angina pectoris: Secondary | ICD-10-CM | POA: Insufficient documentation

## 2018-04-30 DIAGNOSIS — G8929 Other chronic pain: Secondary | ICD-10-CM | POA: Diagnosis present

## 2018-04-30 DIAGNOSIS — C7951 Secondary malignant neoplasm of bone: Secondary | ICD-10-CM | POA: Diagnosis present

## 2018-04-30 DIAGNOSIS — Z7902 Long term (current) use of antithrombotics/antiplatelets: Secondary | ICD-10-CM | POA: Diagnosis not present

## 2018-04-30 DIAGNOSIS — Z7982 Long term (current) use of aspirin: Secondary | ICD-10-CM

## 2018-04-30 DIAGNOSIS — F17210 Nicotine dependence, cigarettes, uncomplicated: Secondary | ICD-10-CM | POA: Insufficient documentation

## 2018-04-30 DIAGNOSIS — I252 Old myocardial infarction: Secondary | ICD-10-CM | POA: Insufficient documentation

## 2018-04-30 DIAGNOSIS — Z20828 Contact with and (suspected) exposure to other viral communicable diseases: Secondary | ICD-10-CM | POA: Diagnosis not present

## 2018-04-30 DIAGNOSIS — T402X5A Adverse effect of other opioids, initial encounter: Secondary | ICD-10-CM | POA: Diagnosis present

## 2018-04-30 DIAGNOSIS — R5382 Chronic fatigue, unspecified: Secondary | ICD-10-CM | POA: Diagnosis present

## 2018-04-30 DIAGNOSIS — M47816 Spondylosis without myelopathy or radiculopathy, lumbar region: Secondary | ICD-10-CM | POA: Diagnosis present

## 2018-04-30 DIAGNOSIS — K5903 Drug induced constipation: Secondary | ICD-10-CM | POA: Diagnosis present

## 2018-04-30 DIAGNOSIS — Z9079 Acquired absence of other genital organ(s): Secondary | ICD-10-CM | POA: Diagnosis not present

## 2018-04-30 DIAGNOSIS — M4802 Spinal stenosis, cervical region: Secondary | ICD-10-CM | POA: Diagnosis present

## 2018-04-30 LAB — COMPREHENSIVE METABOLIC PANEL
ALT: 12 U/L (ref 0–44)
AST: 18 U/L (ref 15–41)
Albumin: 4.1 g/dL (ref 3.5–5.0)
Alkaline Phosphatase: 42 U/L (ref 38–126)
Anion gap: 9 (ref 5–15)
BUN: 16 mg/dL (ref 8–23)
CO2: 23 mmol/L (ref 22–32)
Calcium: 9.5 mg/dL (ref 8.9–10.3)
Chloride: 104 mmol/L (ref 98–111)
Creatinine, Ser: 0.75 mg/dL (ref 0.61–1.24)
GFR calc Af Amer: >60 mL/min (ref >60)
GFR calc non Af Amer: >60 mL/min (ref >60)
Glucose, Bld: 135 mg/dL — ABNORMAL HIGH (ref 70–99)
Potassium: 4.5 mmol/L (ref 3.5–5.1)
Sodium: 136 mmol/L (ref 135–145)
Total Bilirubin: 0.5 mg/dL (ref 0.3–1.2)
Total Protein: 6.9 g/dL (ref 6.5–8.1)

## 2018-04-30 LAB — URINE DRUG SCREEN, QUALITATIVE (ARMC ONLY)
Amphetamines, Ur Screen: NOT DETECTED
Barbiturates, Ur Screen: NOT DETECTED
Benzodiazepine, Ur Scrn: NOT DETECTED
Cannabinoid 50 Ng, Ur ~~LOC~~: NOT DETECTED
Cocaine Metabolite,Ur ~~LOC~~: NOT DETECTED
MDMA (Ecstasy)Ur Screen: NOT DETECTED
Methadone Scn, Ur: NOT DETECTED
Opiate, Ur Screen: POSITIVE — AB
Phencyclidine (PCP) Ur S: NOT DETECTED
Tricyclic, Ur Screen: NOT DETECTED

## 2018-04-30 LAB — CBC
HCT: 40.4 % (ref 39.0–52.0)
Hemoglobin: 13.1 g/dL (ref 13.0–17.0)
MCH: 31.3 pg (ref 26.0–34.0)
MCHC: 32.4 g/dL (ref 30.0–36.0)
MCV: 96.7 fL (ref 80.0–100.0)
Platelets: 182 10*3/uL (ref 150–400)
RBC: 4.18 MIL/uL — ABNORMAL LOW (ref 4.22–5.81)
RDW: 13.8 % (ref 11.5–15.5)
WBC: 8.9 10*3/uL (ref 4.0–10.5)
nRBC: 0 % (ref 0.0–0.2)

## 2018-04-30 LAB — ACETAMINOPHEN LEVEL: Acetaminophen (Tylenol), Serum: 23 ug/mL (ref 10–30)

## 2018-04-30 LAB — ETHANOL: Alcohol, Ethyl (B): <10 mg/dL (ref <10)

## 2018-04-30 LAB — SALICYLATE LEVEL: Salicylate Lvl: <7 mg/dL (ref 2.8–30.0)

## 2018-04-30 MED ORDER — BENEFIBER PO POWD: Freq: Every morning | ORAL | Status: DC

## 2018-04-30 MED ORDER — AMOXICILLIN-POT CLAVULANATE 875-125 MG PO TABS
1.0000 | ORAL_TABLET | Freq: Two times a day (BID) | ORAL | Status: DC
  Administered 2018-04-30 – 2018-05-01 (×2): 1 via ORAL
  Filled 2018-04-30 (×3): qty 1

## 2018-04-30 MED ORDER — ALBUTEROL SULFATE HFA 108 (90 BASE) MCG/ACT IN AERS: 1.0000 | INHALATION_SPRAY | Freq: Four times a day (QID) | RESPIRATORY_TRACT | Status: DC | PRN

## 2018-04-30 MED ORDER — ALUM & MAG HYDROXIDE-SIMETH 200-200-20 MG/5ML PO SUSP: 30.0000 mL | ORAL | Status: DC | PRN

## 2018-04-30 MED ORDER — POLYETHYLENE GLYCOL 3350 17 G PO PACK: 17.0000 g | PACK | Freq: Every day | ORAL | Status: DC | PRN

## 2018-04-30 MED ORDER — PROMETHAZINE HCL 25 MG PO TABS: 12.5000 mg | ORAL_TABLET | Freq: Three times a day (TID) | ORAL | Status: DC | PRN

## 2018-04-30 MED ORDER — ENZALUTAMIDE 40 MG PO CAPS
120.0000 mg | ORAL_CAPSULE | Freq: Every day | ORAL | Status: DC
  Administered 2018-04-30 – 2018-05-01 (×2): 120 mg via ORAL
  Filled 2018-04-30 (×2): qty 3

## 2018-04-30 MED ORDER — AMOXICILLIN-POT CLAVULANATE 875-125 MG PO TABS: 1.0000 | ORAL_TABLET | Freq: Two times a day (BID) | ORAL | Status: DC

## 2018-04-30 MED ORDER — METRONIDAZOLE 500 MG PO TABS: 500.0000 mg | ORAL_TABLET | Freq: Three times a day (TID) | ORAL | Status: DC

## 2018-04-30 MED ORDER — GABAPENTIN 100 MG PO CAPS
100.0000 mg | ORAL_CAPSULE | Freq: Three times a day (TID) | ORAL | Status: DC
  Administered 2018-05-01 (×2): 100 mg via ORAL
  Filled 2018-04-30 (×2): qty 1

## 2018-04-30 MED ORDER — FENTANYL 100 MCG/HR TD PT72
1.0000 | MEDICATED_PATCH | TRANSDERMAL | Status: DC
  Administered 2018-05-01: 1 via TRANSDERMAL
  Filled 2018-04-30: qty 1

## 2018-04-30 MED ORDER — NITROGLYCERIN 0.4 MG SL SUBL: 0.4000 mg | SUBLINGUAL_TABLET | SUBLINGUAL | Status: DC | PRN

## 2018-04-30 MED ORDER — HYDROCODONE-ACETAMINOPHEN 10-325 MG PO TABS: 1.0000 | ORAL_TABLET | Freq: Three times a day (TID) | ORAL | Status: DC | PRN

## 2018-04-30 MED ORDER — TRAZODONE HCL 50 MG PO TABS
50.0000 mg | ORAL_TABLET | Freq: Every evening | ORAL | Status: DC | PRN
  Administered 2018-04-30: 50 mg via ORAL
  Filled 2018-04-30: qty 1

## 2018-04-30 MED ORDER — CLOPIDOGREL BISULFATE 75 MG PO TABS: 75.0000 mg | ORAL_TABLET | Freq: Every day | ORAL | Status: DC

## 2018-04-30 MED ORDER — ACETAMINOPHEN 325 MG PO TABS: 650.0000 mg | ORAL_TABLET | Freq: Four times a day (QID) | ORAL | Status: DC | PRN

## 2018-04-30 MED ORDER — EZETIMIBE 10 MG PO TABS
10.0000 mg | ORAL_TABLET | Freq: Every day | ORAL | Status: DC
  Administered 2018-05-01: 10 mg via ORAL
  Filled 2018-04-30: qty 1

## 2018-04-30 MED ORDER — MIRTAZAPINE 15 MG PO TABS: 7.5000 mg | ORAL_TABLET | Freq: Every day | ORAL | Status: DC

## 2018-04-30 MED ORDER — HYDROXYZINE HCL 25 MG PO TABS: 25.0000 mg | ORAL_TABLET | Freq: Three times a day (TID) | ORAL | Status: DC | PRN

## 2018-04-30 MED ORDER — ATORVASTATIN CALCIUM 20 MG PO TABS: 80.0000 mg | ORAL_TABLET | Freq: Every day | ORAL | Status: DC

## 2018-04-30 MED ORDER — MIRTAZAPINE 15 MG PO TABS
7.5000 mg | ORAL_TABLET | Freq: Every day | ORAL | Status: DC
  Administered 2018-04-30: 7.5 mg via ORAL
  Filled 2018-04-30: qty 1

## 2018-04-30 MED ORDER — METRONIDAZOLE 500 MG PO TABS
500.0000 mg | ORAL_TABLET | Freq: Three times a day (TID) | ORAL | Status: DC
  Administered 2018-05-01 (×2): 500 mg via ORAL
  Filled 2018-04-30 (×3): qty 1

## 2018-04-30 MED ORDER — ESCITALOPRAM OXALATE 10 MG PO TABS: 5.0000 mg | ORAL_TABLET | Freq: Every day | ORAL | Status: DC

## 2018-04-30 MED ORDER — EZETIMIBE 10 MG PO TABS: 10.0000 mg | ORAL_TABLET | Freq: Every day | ORAL | Status: DC

## 2018-04-30 MED ORDER — ASPIRIN EC 81 MG PO TBEC
81.0000 mg | DELAYED_RELEASE_TABLET | Freq: Every day | ORAL | Status: DC
  Administered 2018-05-01: 81 mg via ORAL
  Filled 2018-04-30: qty 1

## 2018-04-30 MED ORDER — ENZALUTAMIDE 40 MG PO CAPS: 120.0000 mg | ORAL_CAPSULE | Freq: Every day | ORAL | Status: DC

## 2018-04-30 MED ORDER — MAGNESIUM HYDROXIDE 400 MG/5ML PO SUSP: 30.0000 mL | Freq: Every day | ORAL | Status: DC | PRN

## 2018-04-30 MED ORDER — ESCITALOPRAM OXALATE 10 MG PO TABS
5.0000 mg | ORAL_TABLET | Freq: Every day | ORAL | Status: DC
  Administered 2018-05-01: 5 mg via ORAL
  Filled 2018-04-30: qty 1

## 2018-04-30 MED ORDER — CLOPIDOGREL BISULFATE 75 MG PO TABS
75.0000 mg | ORAL_TABLET | Freq: Every day | ORAL | Status: DC
  Administered 2018-05-01: 75 mg via ORAL
  Filled 2018-04-30: qty 1

## 2018-04-30 MED ORDER — ALBUTEROL SULFATE HFA 108 (90 BASE) MCG/ACT IN AERS
1.0000 | INHALATION_SPRAY | Freq: Four times a day (QID) | RESPIRATORY_TRACT | Status: DC | PRN
  Filled 2018-04-30: qty 6.7

## 2018-04-30 MED ORDER — METOPROLOL TARTRATE 25 MG PO TABS: 12.5000 mg | ORAL_TABLET | Freq: Two times a day (BID) | ORAL | Status: DC

## 2018-04-30 MED ORDER — FENTANYL 100 MCG/HR TD PT72: 1.0000 | MEDICATED_PATCH | TRANSDERMAL | Status: DC

## 2018-04-30 MED ORDER — METOPROLOL TARTRATE 25 MG PO TABS
12.5000 mg | ORAL_TABLET | Freq: Two times a day (BID) | ORAL | Status: DC
  Administered 2018-04-30 – 2018-05-01 (×2): 12.5 mg via ORAL
  Filled 2018-04-30 (×2): qty 1

## 2018-04-30 MED ORDER — ACETAMINOPHEN 500 MG PO TABS: 1000.0000 mg | ORAL_TABLET | Freq: Three times a day (TID) | ORAL | Status: DC | PRN

## 2018-04-30 MED ORDER — ASPIRIN 81 MG PO TABS: 81.0000 mg | ORAL_TABLET | Freq: Every day | ORAL | Status: DC

## 2018-04-30 MED ORDER — ACETAMINOPHEN 500 MG PO TABS
1000.0000 mg | ORAL_TABLET | Freq: Three times a day (TID) | ORAL | Status: DC | PRN
  Administered 2018-05-01: 1000 mg via ORAL
  Filled 2018-04-30 (×3): qty 2

## 2018-04-30 MED ORDER — HYDROCODONE-ACETAMINOPHEN 10-325 MG PO TABS
1.0000 | ORAL_TABLET | Freq: Three times a day (TID) | ORAL | Status: DC | PRN
  Administered 2018-04-30 – 2018-05-01 (×2): 1 via ORAL
  Filled 2018-04-30 (×2): qty 1

## 2018-04-30 MED ORDER — ATORVASTATIN CALCIUM 20 MG PO TABS
80.0000 mg | ORAL_TABLET | Freq: Every day | ORAL | Status: DC
  Administered 2018-04-30: 80 mg via ORAL
  Filled 2018-04-30: qty 4

## 2018-04-30 MED ORDER — PSYLLIUM 95 % PO PACK
1.0000 | PACK | Freq: Every morning | ORAL | Status: DC
  Administered 2018-05-01: 10:00:00 1 via ORAL
  Filled 2018-04-30: qty 1

## 2018-04-30 MED ORDER — GABAPENTIN 100 MG PO CAPS: 100.0000 mg | ORAL_CAPSULE | Freq: Three times a day (TID) | ORAL | Status: DC

## 2018-04-30 NOTE — Progress Notes (Signed)
Admission Note:  D: Pt appeared depressed  With  a flat affect. 68 year old white male in undcer the service of Dr. Weber Cooks . Patient  Presents with thought of hurting himself . Once he got to unit stated he would never do that because of religious beliefs .   Patient also presents with bone cancer unknown of what  stage  And an accusation of  having indecent liberties  with his stepdaughters child. Patient very angry and upset about being here . Patient cursed  The entire admission  Process and stated he was going to sue the hospital  Pt  denies SI / AVH at this time.  Pt is redirectable and cooperative with assessment.      A: Pt admitted to unit per protocol, skin assessment and search done  Skin intact and no contraband found.  Pt  educated on therapeutic milieu rules. Pt was introduced to milieu by nursing staff.    R: Pt was receptive to education about the milieu .  15 min safety checks started. Probation officer offered support

## 2018-04-30 NOTE — Progress Notes (Signed)
I connected with Larin Weissberg Peretz on 04/30/18 at  2:00 PM EDT by telephone and verified that I am speaking with the correct person using two identifiers.   I discussed the limitations, risks, security and privacy concerns of performing an evaluation and management service by telephone and the availability of in person appointments. I also discussed with the patient that there may be a patient responsible charge related to this service. The patient expressed understanding and agreed to proceed.   History of Present Illness: Palliative Care consult requested for this 68 y.o. male with multiple medical problems including stage IV prostate cancer metastatic to bone currently on Xtandi. Patient has had rising PSA levels on treatment.  Patient has had multiple symptoms including fatigue and pain.  He was referred to palliative care to help with symptom management and to address goals.   Observations/Objective: Case discussed with Dr. Rogue Bussing regarding patient's reported depression and intermittent suicidality.   I called and spoke with both patient and his wife via telephone.  Patient says that he is facing a serious legal matter but would not divulge the specifics.  He is fearful of facing prison time and says that he would end his life before that happened.  He denies being actively suicidal at the time of our conversation but says that he has been thinking about it recently.  He denies having an active method.  Patient owns a black rifle and I strongly recommended to wife that she remove it from the home.   Patient contracts for safety during our conversation.  He agreed that he would speak with his wife and seek help in the ER should he feel suicidal.   Patient endorses severe depression.  He is sleeping much of the day and has little energy to do activities he once found enjoyable.  Appetite is reportedly stable.  Patient does have some periods where he is waking at night feeling panicked and anxious.    Patient has been treated on antidepressants in the past including Paxil and Effexor.  He says that he did not like the way that those medications made him feel and that he felt relatively sensitive to antidepressants.   Patient is in agreement with referral to psychiatry, counseling, and starting an antidepressant.   Case and plan discussed with Dr. Rogue Bussing.   Assessment and Plan: -Referral to psychiatry -Referral to counseling (spoke with Janeann Merl, LCSW, who will call patient) -Will start Lexapro 5 mg daily given reported sensitivity to antidepressants   Follow Up Instructions: -Call 911 in the event of feeling suicidal   I discussed the assessment and treatment plan with the patient. The patient was provided an opportunity to ask questions and all were answered. The patient agreed with the plan and demonstrated an understanding of the instructions.   The patient was advised to call back or seek an in-person evaluation if the symptoms worsen or if the condition fails to improve as anticipated.   I provided 30 minutes of non-face-to-face time during this encounter.     Irean Hong, NP

## 2018-04-30 NOTE — Plan of Care (Signed)
  Problem: Education: Goal: Knowledge of [facility] Education information/materials will improve Note:  Patient informed of [facility] education and unit programing . Able to verbalize understanding

## 2018-04-30 NOTE — BH Assessment (Signed)
Patient is to be admitted to [facility] by Dr. Leverne Humbles.  Attending Physician will be Dr. Weber Cooks.   Patient has been assigned to room 309, by [place]   Intake Paper Work has been signed and placed on patient chart.  ER staff is aware of the admission:   Dr. Burlene Arnt, ER MD   Amy B., Patient's Nurse   Tamera Stands, Patient Access.

## 2018-04-30 NOTE — Consult Note (Signed)
[facility] Face-to-Face Psychiatry Consult   Reason for Consult: Suicidal Referring Physician: Dr. Jimmye Norman Patient Identification: Robert Short MRN:  [id number] Principal Diagnosis: Suicidal ideation Diagnosis:  Principal Problem:   Suicidal ideation Active Problems:   CFIDS (chronic fatigue and immune dysfunction syndrome) (HCC)   Cervical spinal stenosis   Essential (primary) hypertension   Chronic pain   Long term current use of opiate analgesic   Therapeutic opioid-induced constipation (OIC)   Chronic neck pain   Cervical spondylosis   Chronic low back pain   Lumbar spondylosis   Chronic shoulder pain   Prostate cancer metastatic to bone (HCC)   Cancer-related pain   Neurogenic pain   Reaction, adjustment, with anxious, depressed mood  Patient seen, chart reviewed, collateral obtained from wife, Robert Short Total Time spent with patient: 1 hour  Subjective: "I had rather be dead than spend the rest of my life in jail."  HPI: Robert Short is a 68 y.o. male patient with a history of coronary artery disease, bone cancer, depression, hyperlipidemia, hypertension, prostate cancer who presents to the ED for suicidal thoughts.  Patient states he is been diagnosed with metastatic cancer and he has had thoughts of hurting himself but not actively.  On evaluation, patient reports that he has a history of prostate cancer many years ago that was treated and now for the past 3 to 4 years he has been treated for bone metastasis.  He reports severe chronic pain that keeps him awake.  He states that he is currently on a fentanyl patch that was recently increased.  Patient feels he has been managing his mood related to cancer and cancer treatment relatively well, however relates that approximately 2 weeks ago he was charged with indecent liberties on a child, and now will need to appear in court.  He states that the charges were pressed by his ex daughter-in-law for him having inappropriate contact  with his 52-year-old granddaughter.  Patient reports that "my granddaughter was my life, we spent a lot of time together, she is accusing me of touching her leg.  I have not even seen my granddaughter for the past year because of this, and I was not expecting these charges to be placed.  Now I have to appear in court, and my anxiety is high."  Patient does report depressed mood since he has not been able to see his granddaughter.  He reports that he has no other friends and is otherwise socially isolated other than his wife, with whom he has a good relationship.  He describes poor sleep, related to pain.  He reports poor appetite and states he has had to have his bottom teeth pulled.  He endorses low energy and poor concentration.  Patient is overall irritable related to his legal issues, measures that have been taken for coronavirus, and humanities overall disbelief in God.  Patient reports that he has contemplated suicide, and does have a gun.  He states that he does not like to think of the mess a suicide by gun shot would lead for his wife, and he also states that his religious believes would never allow him to complete a suicide.  Yet, he does continue to have these thoughts especially in the light of the charges that have been placed against him in the upcoming court date.  He rates his anxiety 8-9 out of 10 with 10 being most severe.  He reports his mood as "okay" and rates it 5 out of 10.  Patient currently is denying any homicidal ideation.  He denies any history of mania or psychosis. Patient admits that he told his oncologist that he was wanting to end his life yesterday at his appointment.  He reports that his oncologist called today and urged him to come to the emergency department to seek help.  Patient is willing to be voluntarily admitted to the inpatient psychiatry unit.  Past Psychiatric History: Patient has a history of depression and has been treated in the past with Cymbalta, Effexor,  Wellbutrin, Paxil, and possibly other medications.  He has not found these medications to be effective in the past. Medication review shows patient to be on Lexapro 5 mg daily currently.  Risk to Self:  Yes Risk to Others:  Denies Prior Inpatient Therapy:  None Prior Outpatient Therapy:  None, received prescriptions from primary care providers in past.  Collateral is obtained from patient's wife who endorses concerns for patient's safety especially when she is at work as she reports his depression and suicidal thoughts worsen when he is left alone.  Past Medical History:  Past Medical History:  Diagnosis Date  . Back pain 10/09/2012  . Bone cancer ([place])   . CAD (coronary artery disease)    a. 08/2015 PCI: LM nl, LAD 20d, LCX nl, RCA 55m RPAV 95 (3.0x18 Xience Alpine DES);  b. 04/2016 PCI ([facility]: RPL 95 (2.75x8 Promus Premier DES).  . Cancer associated pain   . Depression   . History of echocardiogram    a. 08/2016 Echo: EF 50-55%.  .Marland KitchenHistory of kidney stones   . Hyperlipidemia   . Hypertension   . Joint pain   . Prostate cancer ([facility]    a.  s/p prostatectomy ([facility]);  b. bone mets noted 04/2016.  . Right arm pain 01/10/2016  . Right foot pain 01/10/2016  . Right leg pain 01/10/2016  . Tobacco abuse     Past Surgical History:  Procedure Laterality Date  . CARDIAC CATHETERIZATION     [facility]  . CARDIAC CATHETERIZATION N/A 08/16/2015   Procedure: Left Heart Cath and Coronary Angiography;  Surgeon: DYolonda Kida MD;  [facility];  Service: Cardiovascular;  Laterality: N/A;  . CARDIAC CATHETERIZATION N/A 08/16/2015   Procedure: Coronary Stent Intervention;  Surgeon: DYolonda Kida MD;  [facility];  Service: Cardiovascular;  Laterality: N/A;  . CERVIX SURGERY    . PROSTATECTOMY    . SPINE SURGERY    . TONSILLECTOMY    . WRIST SURGERY     Family History:  Family History  Problem Relation Age of Onset  . Heart attack Mother   .  Hypertension Mother   . Heart attack Father    Family Psychiatric  History:  Alcohol abuse Maternal aunt with on specified mental health diagnosis No suicides in family   Social History:  Social History   Substance and Sexual Activity  Alcohol Use Yes  . Frequency: Never   Comment: occasionally     Social History   Substance and Sexual Activity  Drug Use No    Social History   Socioeconomic History  . Marital status: Married    Spouse name: Not on file  . Number of children: Not on file  . Years of education: Not on file  . Highest education level: Some college, no degree  Occupational History  . Occupation: Disabled    Comment: since 1999 - chronic back pain and DDD.  Social Needs  .  Financial resource strain: Not hard at all  . Food insecurity:    Worry: Never true    Inability: Never true  . Transportation needs:    Medical: No    Non-medical: No  Tobacco Use  . Smoking status: Current Every Day Smoker    Packs/day: 0.50    Years: 40.00    Pack years: 20.00    Types: Cigarettes  . Smokeless tobacco: Current User    Types: Chew  . Tobacco comment: 1 pack every couple days   Substance and Sexual Activity  . Alcohol use: Yes    Frequency: Never    Comment: occasionally  . Drug use: No  . Sexual activity: Not on file  Lifestyle  . Physical activity:    Days per week: 0 days    Minutes per session: 0 min  . Stress: Not at all  Relationships  . Social connections:    Talks on phone: Once a week    Gets together: Once a week    Attends religious service: More than 4 times per year    Active member of club or organization: No    Attends meetings of clubs or organizations: Never    Relationship status: Married  Other Topics Concern  . Not on file  Social History Narrative   Lives in [place] with wife.  Does not routinely exercise.   Additional Social History: Lives at home with his wife of 24 years + 5 years together before that Wife works 10  hour days in a [employer] lab in [place], and worries that patient is not safe to be left alone.   Last alcohol use has been 2-3 months ago Attempted marijuana in December 2019 while in [place] for cancer pain relief, but reports did not like the way it made his head feel. Denies other substance use.  Charged with indecent liberties with a minor (one is an 26 year old granddaughter (accusations from ex- daughter-in-law, other incident was 5-6 years ago). Arrested 20th with 1st appearance the [address] date 05/22/2018    Allergies:   Allergies  Allergen Reactions  . Ditropan [Oxybutynin] Other (See Comments)    Hypotension and muscle weakness    Labs:  Results for orders placed or performed during the hospital encounter of 04/30/18 (from the past 48 hour(s))  Comprehensive metabolic panel     Status: Abnormal   Collection Time: 04/30/18  2:08 PM  Result Value Ref Range   Sodium 136 135 - 145 mmol/L   Potassium 4.5 3.5 - 5.1 mmol/L   Chloride 104 98 - 111 mmol/L   CO2 23 22 - 32 mmol/L   Glucose, Bld 135 (H) 70 - 99 mg/dL   BUN 16 8 - 23 mg/dL   Creatinine, Ser 0.75 0.61 - 1.24 mg/dL   Calcium 9.5 8.9 - 10.3 mg/dL   Total Protein 6.9 6.5 - 8.1 g/dL   Albumin 4.1 3.5 - 5.0 g/dL   AST 18 15 - 41 U/L   ALT 12 0 - 44 U/L   Alkaline Phosphatase 42 38 - 126 U/L   Total Bilirubin 0.5 0.3 - 1.2 mg/dL   GFR calc non Af Amer >60 >60 mL/min   GFR calc Af Amer >60 >60 mL/min   Anion gap 9 5 - 15    Comment: Performed at [facility], [address], [place], [place] [address]  Ethanol     Status: None   Collection Time: 04/30/18  2:08 PM  Result  Value Ref Range   Alcohol, Ethyl (B) <10 <10 mg/dL    Comment: (NOTE) Lowest detectable limit for serum alcohol is 10 mg/dL. For medical purposes only. Performed at [facility], [address] [place], [place] [address]   Salicylate level     Status: None   Collection Time: 04/30/18  2:08 PM  Result Value Ref Range    Salicylate Lvl <0.8 2.8 - 30.0 mg/dL    Comment: Performed at [facility], [address] [place], [place] [address]  Acetaminophen level     Status: None   Collection Time: 04/30/18  2:08 PM  Result Value Ref Range   Acetaminophen (Tylenol), Serum 23 10 - 30 ug/mL    Comment: (NOTE) Therapeutic concentrations vary significantly. A range of 10-30 ug/mL  may be an effective concentration for many patients. However, some  are best treated at concentrations outside of this range. Acetaminophen concentrations >150 ug/mL at 4 hours after ingestion  and >50 ug/mL at 12 hours after ingestion are often associated with  toxic reactions. Performed at [facility], [address] [place], [place] [address]   cbc     Status: Abnormal   Collection Time: 04/30/18  2:08 PM  Result Value Ref Range   WBC 8.9 4.0 - 10.5 K/uL   RBC 4.18 (L) 4.22 - 5.81 MIL/uL   Hemoglobin 13.1 13.0 - 17.0 g/dL   HCT 40.4 39.0 - 52.0 %   MCV 96.7 80.0 - 100.0 fL   MCH 31.3 26.0 - 34.0 pg   MCHC 32.4 30.0 - 36.0 g/dL   RDW 13.8 11.5 - 15.5 %   Platelets 182 150 - 400 K/uL   nRBC 0.0 0.0 - 0.2 %    Comment: Performed at [facility], [address] [place], [place] [address]    No current facility-administered medications for this encounter.    Current Outpatient Medications  Medication Sig Dispense Refill  . acetaminophen (TYLENOL) 500 MG tablet Take 1,000 mg by mouth every 8 (eight) hours as needed.     Marland Kitchen albuterol (PROVENTIL HFA;VENTOLIN HFA) 108 (90 Base) MCG/ACT inhaler Inhale 1-2 puffs into the lungs every 6 (six) hours as needed for wheezing or shortness of breath. 1 Inhaler 0  . amoxicillin-clavulanate (AUGMENTIN) 875-125 MG tablet Take 1 tablet by mouth 2 (two) times daily. 14 tablet 0  . aspirin 81 MG tablet Take 81 mg by mouth daily.    Marland Kitchen atorvastatin (LIPITOR) 80 MG tablet TAKE 1 TABLET (80 MG TOTAL) BY MOUTH DAILY AT 6 PM. 90 tablet 1  . clopidogrel (PLAVIX) 75 MG  tablet TAKE 1 TABLET BY MOUTH DAILY 90 tablet 0  . enzalutamide (XTANDI) 40 MG capsule Take 3 capsules (120 mg total) by mouth daily. 84 capsule 3  . escitalopram (LEXAPRO) 10 MG tablet Take 0.5 tablets (5 mg total) by mouth daily. 30 tablet 0  . ezetimibe (ZETIA) 10 MG tablet Take 1 tablet (10 mg total) by mouth daily. 90 tablet 3  . fentaNYL (DURAGESIC) 100 MCG/HR Place 1 patch onto the skin every other day. Along with 66mg patch for total of 125 mcg every 2 days. 15 patch 0  . fentaNYL (DURAGESIC) 25 MCG/HR Place 1 patch onto the skin every other day. Use this along with fentanyl patch 100 mcg [total 125 mcg] every 2 days 15 patch 0  . gabapentin (NEURONTIN) 100 MG capsule START 1 CAPSULE DAILY, INCREASE BY 1 CAP EVERY 2-3 DAYS AS TOLERATED UP TO  3 TIMES A DAY, OR MAY TAKE 3 AT ONCE IN [place]. 90 capsule 5  . HYDROcodone-acetaminophen (NORCO) 10-325 MG tablet Take 1 tablet by mouth every 8 (eight) hours as needed. 90 tablet 0  . metoprolol tartrate (LOPRESSOR) 25 MG tablet Take 0.5 tablets (12.5 mg total) by mouth 2 (two) times daily. 90 tablet 1  . metroNIDAZOLE (FLAGYL) 500 MG tablet Take 1 tablet (500 mg total) by mouth 3 (three) times daily. Do not drink alcohol while taking this medicine. (Patient not taking: Reported on 03/15/2018) 21 tablet 0  . naloxone (NARCAN) nasal spray 4 mg/0.1 mL 1 spray into nostril upon signs of opioid overdose. Call 911. May repeat once if no response within 2-3 minutes. (Patient not taking: Reported on 04/19/2018) 1 kit 0  . nitroGLYCERIN (NITROSTAT) 0.4 MG SL tablet Place 0.4 mg under the tongue every 5 (five) minutes as needed.     . polyethylene glycol (MIRALAX / GLYCOLAX) packet Take 17 g by mouth daily as needed for moderate constipation.    . promethazine (PHENERGAN) 12.5 MG tablet Take 1-2 tablets (12.5-25 mg total) by mouth every 8 (eight) hours as needed for nausea or vomiting. 60 tablet 3  . Wheat Dextrin (BENEFIBER) POWD Stir 2 tsp. TID into 4-8 oz of  any non-carbonated beverage or soft food (hot or cold) 500 g PRN    Musculoskeletal: Strength & Muscle Tone: decreased Gait & Station: Walks with a cane for assistance Patient leans: N/A  Psychiatric Specialty Exam: Physical Exam  Nursing note and vitals reviewed. Constitutional: He is oriented to person, place, and time. He appears well-developed and well-nourished. No distress.  HENT:  Head: Normocephalic and atraumatic.  Eyes: EOM are normal.  Neck: Normal range of motion.  Cardiovascular: Normal rate and regular rhythm.  Respiratory: Effort normal. No respiratory distress.  Musculoskeletal: Normal range of motion.  Neurological: He is alert and oriented to person, place, and time.    Review of Systems  HENT: Negative.   Respiratory: Negative.   Cardiovascular: Negative.   Gastrointestinal: Positive for constipation.  Genitourinary: Positive for urgency.  Musculoskeletal: Positive for back pain, joint pain, myalgias and neck pain. Negative for falls.  Neurological: Positive for weakness.  Psychiatric/Behavioral: Positive for depression and suicidal ideas. Negative for hallucinations, memory loss and substance abuse. The patient is nervous/anxious and has insomnia.     Blood pressure (!) 155/115, pulse 78, temperature 98.4 F (36.9 C), temperature source Oral, resp. rate 16, height _0  (1.803 m), weight 77.1 kg, SpO2 97 %.Body mass index is 23.71 kg/m.  General Appearance: Casual and Neat  Eye Contact:  Good  Speech:  Clear and Coherent and Normal Rate  Volume:  Normal  Mood:  Anxious, Dysphoric and Irritable  Affect:  Congruent  Thought Process:  Coherent, Linear and Descriptions of Associations: Tangential  Orientation:  Full (Time, Place, and Person)  Thought Content:  Logical and Hallucinations: None  Suicidal Thoughts:  Yes.  without intent/plan  Homicidal Thoughts:  No  Memory:  Good  Judgement:  Fair  Insight:  Good  Psychomotor Activity:  Normal   Concentration:  Concentration: Good and Attention Span: Good  Recall:  Good  Fund of Knowledge:  Good  Language:  Good  Akathisia:  No  Handed:  Right  AIMS (if indicated):     Assets:  Communication Skills Desire for Improvement Financial Resources/Insurance Housing Intimacy Leisure Time Resilience Social Support  ADL's:  Intact  Cognition:  WNL  Sleep:   Poor  Treatment Plan Summary: Daily contact with patient to assess and evaluate symptoms and progress in treatment and Medication management  Restarted home medications to include Lexapro.  Will add mirtazapine 7.5 mg at bedtime for depressed and anxious mood to maximize side effect profile of improved appetite and sleep.  Defer further psychiatric medication management inpatient psychiatry team.  Disposition: Recommend psychiatric Inpatient admission when medically cleared. Supportive therapy provided about ongoing stressors. Signs voluntarily for admission  Lavella Hammock, MD 04/30/2018 3:26 PM

## 2018-04-30 NOTE — ED Notes (Signed)
Pt discharged to BMU. Voluntary consent signed. VS stable. Report given to Isaias Sakai, Therapist, sports.

## 2018-04-30 NOTE — Progress Notes (Signed)
Virtual Visit via Telephone Note  I connected with Robert Short on 04/30/18 at 10:30 AM EDT by telephone and verified that I am speaking with the correct person using two identifiers.   I discussed the limitations, risks, security and privacy concerns of performing an evaluation and management service by telephone and the availability of in person appointments. I also discussed with the patient that there may be a patient responsible charge related to this service. The patient expressed understanding and agreed to proceed.   History of Present Illness: Palliative Care consult requested for this68 y.o.malewith multiple medical problems including stage IV prostate cancer metastatic to bone currently on Xtandi. Patient has had rising PSA levels on treatment. Patient has had multiple symptoms including fatigue and pain. He was referred to palliative care to help with symptom management and to address goals.   Observations/Objective: I spoke with Dr. Nicolasa Ducking via phone about new referral.  Given urgency of the referral it was recommended that patient be sent to the ER either voluntarily or involuntarily for psychiatric evaluation.  I called and spoke with both patient and his wife.  Patient was still in bed and says that he feels about the same.  Discussed the recommendation of evaluation in the ER and patient was in agreement.  He says that he will come to the ER this afternoon.  Wife says that she will help coordinate.  I called report to the ER physician.  Case and plan discussed with Dr. Rogue Bussing.  Assessment and Plan: -Patient has been referred to the ER for psychiatric evaluation given recent SI and high risk factors (poor cancer prognosis and acute legal matters)   I discussed the assessment and treatment plan with the patient. The patient was provided an opportunity to ask questions and all were answered. The patient agreed with the plan and demonstrated an understanding of the  instructions.   The patient was advised to call back or seek an in-person evaluation if the symptoms worsen or if the condition fails to improve as anticipated.  I provided 15 minutes of non-face-to-face time during this encounter.   Irean Hong, NP

## 2018-04-30 NOTE — ED Notes (Signed)
VOL/  PENDING  CONSULT 

## 2018-04-30 NOTE — Tx Team (Signed)
Initial Treatment Plan 04/30/2018 6:34 PM Robert Short VDI:[id number]    PATIENT STRESSORS: Health problems Legal issue Medication change or noncompliance   PATIENT STRENGTHS: Ability for insight Average or above average intelligence Capable of independent living Communication skills Financial means Supportive family/friends   PATIENT IDENTIFIED PROBLEMS: Depression  04/30/2018  Cancer bone  04/30/2018  Legal issues  04/30/2018                 DISCHARGE CRITERIA:  Ability to meet basic life and health needs Improved stabilization in mood, thinking, and/or behavior Medical problems require only outpatient monitoring  PRELIMINARY DISCHARGE PLAN: Outpatient therapy Return to previous living arrangement  PATIENT/FAMILY INVOLVEMENT: This treatment plan has been presented to and reviewed with the patient, Robert Short, and/or family member,  .  The patient and family have been given the opportunity to ask questions and make suggestions.  Leodis Liverpool, RN 04/30/2018, 6:34 PM

## 2018-04-30 NOTE — ED Notes (Signed)
Called lab for UDS add on. Spoke to Diablock in lab said she would add it on.

## 2018-04-30 NOTE — ED Provider Notes (Signed)
[facility] Emergency Department Provider Note       Time seen: ----------------------------------------- 2:22 PM on 04/30/2018 -----------------------------------------  I have reviewed the triage vital signs and the nursing notes.  HISTORY   Chief Complaint Psychiatric Evaluation   HPI Robert Short is a 68 y.o. male with a history of coronary artery disease, bone cancer, depression, hyperlipidemia, hypertension, prostate cancer who presents to the ED for suicidal thoughts.  Patient states he is been diagnosed with metastatic cancer and he has had thoughts of hurting himself but not actively.  Past Medical History:  Diagnosis Date  . Back pain 10/09/2012  . Bone cancer ([place])   . CAD (coronary artery disease)    a. 08/2015 PCI: LM nl, LAD 20d, LCX nl, RCA 20m, RPAV 95 (3.0x18 Xience Alpine DES);  b. 04/2016 PCI [facility]): RPL 95 (2.75x8 Promus Premier DES).  . Cancer associated pain   . Depression   . History of echocardiogram    a. 08/2016 Echo: EF 50-55%.  Marland Kitchen History of kidney stones   . Hyperlipidemia   . Hypertension   . Joint pain   . Prostate cancer [facility])    a.  s/p prostatectomy ([facility]);  b. bone mets noted 04/2016.  . Right arm pain 01/10/2016  . Right foot pain 01/10/2016  . Right leg pain 01/10/2016  . Tobacco abuse     Patient Active Problem List   Diagnosis Date Noted  . Chest pain 08/02/2016  . Pre-diabetes 07/18/2016  . Stable angina ([place]) 04/21/2016  . Nausea 01/17/2016  . History of ST elevation myocardial infarction (STEMI) 08/16/2015  . Neoplasm related pain 06/22/2015  . Cancer associated pain 02/04/2015  . Sebaceous cyst 01/18/2015  . Chronic neck pain 12/08/2014  . Cervical spondylosis 12/08/2014  . Chronic low back pain 12/08/2014  . Lumbar spondylosis 12/08/2014  . Chronic shoulder pain 12/08/2014  . Prostate cancer metastatic to bone ([place]) 12/08/2014  . Cancer-related pain 12/08/2014  . Neurogenic pain 12/08/2014  .  Musculoskeletal pain 12/08/2014  . Chronic pain 12/07/2014  . Long term current use of opiate analgesic 12/07/2014  . Long term prescription opiate use 12/07/2014  . Opiate use 12/07/2014  . Encounter for therapeutic drug level monitoring 12/07/2014  . Encounter for chronic pain management 12/07/2014  . Therapeutic opioid-induced constipation (OIC) 12/07/2014  . Fatigue 08/27/2014  . CFIDS (chronic fatigue and immune dysfunction syndrome) ([place]) 06/04/2014  . Cervical spinal stenosis 06/04/2014  . Clinical depression 06/04/2014  . Essential (primary) hypertension 06/04/2014  . HLD (hyperlipidemia) 06/04/2014  . Amnesia 06/04/2014  . Chronic cervical pain 06/04/2014  . Neuropathy ([place]) 06/04/2014  . Loss of feeling or sensation 06/04/2014  . Pain in shoulder 06/04/2014  . Compulsive tobacco user syndrome 06/04/2014  . CAD (coronary artery disease) 10/09/2012  . Smoker 10/09/2012  . ED (erectile dysfunction) of organic origin 08/08/2012  . Acid reflux 08/08/2012  . Lower urinary tract symptoms 08/08/2012  . Restless leg 08/01/2010  . Back ache 08/01/2010    Past Surgical History:  Procedure Laterality Date  . CARDIAC CATHETERIZATION     [facility]  . CARDIAC CATHETERIZATION N/A 08/16/2015   Procedure: Left Heart Cath and Coronary Angiography;  Surgeon: Yolonda Kida, MD;  Location: [place] CV [facility];  Service: Cardiovascular;  Laterality: N/A;  . CARDIAC CATHETERIZATION N/A 08/16/2015   Procedure: Coronary Stent Intervention;  Surgeon: Yolonda Kida, MD;  Location: [place] CV [facility];  Service: Cardiovascular;  Laterality: N/A;  . CERVIX  SURGERY    . PROSTATECTOMY    . SPINE SURGERY    . TONSILLECTOMY    . WRIST SURGERY      Allergies Ditropan [oxybutynin]  Social History Social History   Tobacco Use  . Smoking status: Current Every Day Smoker    Packs/day: 0.50    Years: 40.00    Pack years: 20.00    Types: Cigarettes  . Smokeless tobacco: Current User     Types: Chew  . Tobacco comment: 1 pack every couple days   Substance Use Topics  . Alcohol use: Yes    Frequency: Never    Comment: occasionally  . Drug use: No   Review of Systems Constitutional: Negative for fever. Cardiovascular: Negative for chest pain. Respiratory: Negative for shortness of breath. Gastrointestinal: Negative for abdominal pain, vomiting and diarrhea. Musculoskeletal: Negative for back pain. Skin: Negative for rash. Neurological: Negative for headaches, focal weakness or numbness. Psychiatric: Positive for suicidal ideation  All systems negative/normal/unremarkable except as stated in the HPI  ____________________________________________   PHYSICAL EXAM:  VITAL SIGNS: ED Triage Vitals  Enc Vitals Group     BP 04/30/18 1349 (!) 155/115     Pulse Rate 04/30/18 1349 78     Resp 04/30/18 1349 16     Temp 04/30/18 1349 98.4 F (36.9 C)     Temp Source 04/30/18 1349 Oral     SpO2 04/30/18 1349 97 %     Weight 04/30/18 1351 170 lb (77.1 kg)     Height 04/30/18 1351 5\' 11"  (1.803 m)     Head Circumference --      Peak Flow --      Pain Score 04/30/18 1350 5     Pain Loc --      Pain Edu? --      Excl. in [place]? --    Constitutional: Alert and oriented. Well appearing and in no distress. ENT      Head: Normocephalic and atraumatic.      Nose: No congestion/rhinnorhea.      Mouth/Throat: Mucous membranes are moist.      Neck: No stridor. Cardiovascular: Normal rate, regular rhythm. No murmurs, rubs, or gallops. Respiratory: Normal respiratory effort without tachypnea nor retractions. Breath sounds are clear and equal bilaterally. No wheezes/rales/rhonchi. Gastrointestinal: Soft and nontender. Normal bowel sounds Musculoskeletal: Nontender with normal range of motion in extremities. No lower extremity tenderness nor edema. Neurologic:  Normal speech and language. No gross focal neurologic deficits are appreciated.  Skin:  Skin is warm, dry and intact. No  rash noted. Psychiatric: Flat affect ____________________________________________  ED COURSE:  As part of my medical decision making, I reviewed the following data within the [place] History obtained from family if available, nursing notes, old chart and ekg, as well as notes from prior ED visits. Patient presented for suicidal ideation, we will assess with labs and imaging as indicated at this time.   Procedures  Robert Short was evaluated in Emergency Department on 04/30/2018 for the symptoms described in the history of present illness. He was evaluated in the context of the global COVID-19 pandemic, which necessitated consideration that the patient might be at risk for infection with the SARS-CoV-2 virus that causes COVID-19. Institutional protocols and algorithms that pertain to the evaluation of patients at risk for COVID-19 are in a state of rapid change based on information released by regulatory bodies including the CDC and federal and state organizations. These policies and algorithms were  followed during the patient's care in the ED.  ____________________________________________   LABS (pertinent positives/negatives)  Labs Reviewed  COMPREHENSIVE METABOLIC PANEL - Abnormal; Notable for the following components:      Result Value   Glucose, Bld 135 (*)    All other components within normal limits  CBC - Abnormal; Notable for the following components:   RBC 4.18 (*)    All other components within normal limits  ETHANOL  SALICYLATE LEVEL  ACETAMINOPHEN LEVEL  URINE DRUG SCREEN, QUALITATIVE ([facility] ONLY)  ____________________________________________   DIFFERENTIAL DIAGNOSIS   Depression, suicidal ideation, metastatic cancer  FINAL ASSESSMENT AND PLAN  Depression, suicidal ideation   Plan: The patient had presented for suicidal ideation. Patient's labs did not reveal any acute process, he appears medically clear for psychiatric evaluation and disposition.Laurence Aly, MD    Note: This note was generated in part or whole with voice recognition software. Voice recognition is usually quite accurate but there are transcription errors that can and very often do occur. I apologize for any typographical errors that were not detected and corrected.     Earleen Newport, MD 04/30/18 1447

## 2018-04-30 NOTE — ED Triage Notes (Signed)
Pt states he was sent by his PCP with c/o having suicidal thoughts, states he has prostates CA that has spread to the bone now. Pt states he would never commit suicide but the doctor want him to talk with someone or get on some sort of medication.

## 2018-04-30 NOTE — BH Assessment (Addendum)
Assessment Note  Robert Short is an 68 y.o. male who presents to the ER after he was instructed to do so by his oncologist. Patient reported to his doctor he was having thoughts of ending his life and it cause concern. Patient is diagnosed with bone cancer and recently accused of having indecent liberties with a minor, his stepdaughter's child. Patient admits to having the thoughts of shooting himself but don't like the ideal of it "being too messy. That will leave a lot of blood everywhere." Patient has access to a gun. Patient also states, he hasn't ended his life because of his religious beliefs, but he is "looking in the Bible to find a scripture that says it's okay," in order to justify him ending his life. Patient spend majority of his day alone because he wife works ten-hour shifts.   During the interview, the patient was calm, cooperative and pleasant. He was able to provided appropriate answers to the questions. He denies history of violence and aggression. His current involvement with the legal system is due to the accusations of indecent liberties with a minor. He states, he didn't do it and "hurts" because his grandchild "was my everything. And for them to lie like that hurts." Patient reports of having no concerns about proven his innocents, "but the fact I have to go through this is some bullshit. I can get ten years over a lie."  Patient denies HI and AV/H.  Diagnosis: Major Depression  Past Medical History:  Past Medical History:  Diagnosis Date  . Back pain 10/09/2012  . Bone cancer ([place])   . CAD (coronary artery disease)    a. 08/2015 PCI: LM nl, LAD 20d, LCX nl, RCA 43m, RPAV 95 (3.0x18 Xience Alpine DES);  b. 04/2016 PCI [facility]): RPL 95 (2.75x8 Promus Premier DES).  . Cancer associated pain   . Depression   . History of echocardiogram    a. 08/2016 Echo: EF 50-55%.  Marland Kitchen History of kidney stones   . Hyperlipidemia   . Hypertension   . Joint pain   . Prostate cancer [facility])     a.  s/p prostatectomy ([facility]);  b. bone mets noted 04/2016.  . Right arm pain 01/10/2016  . Right foot pain 01/10/2016  . Right leg pain 01/10/2016  . Tobacco abuse     Past Surgical History:  Procedure Laterality Date  . [facility]ATION     [facility]  . CARDIAC CATHETERIZATION N/A 08/16/2015   Procedure: Left Heart Cath and Coronary Angiography;  Surgeon: Yolonda Kida, MD;  Location: [place] CV [facility];  Service: Cardiovascular;  Laterality: N/A;  . CARDIAC CATHETERIZATION N/A 08/16/2015   Procedure: Coronary Stent Intervention;  Surgeon: Yolonda Kida, MD;  Location: [place] CV LAB;  Service: Cardiovascular;  Laterality: N/A;  . CERVIX SURGERY    . PROSTATECTOMY    . SPINE SURGERY    . TONSILLECTOMY    . WRIST SURGERY      Family History:  Family History  Problem Relation Age of Onset  . Heart attack Mother   . Hypertension Mother   . Heart attack Father     Social History:  reports that he has been smoking cigarettes. He has a 20.00 pack-year smoking history. His smokeless tobacco use includes chew. He reports current alcohol use. He reports that he does not use drugs.  Additional Social History:  Alcohol / Drug Use Pain Medications: See PTA Prescriptions: See PTA Over the Counter: See PTA History  of alcohol / drug use?: No history of alcohol / drug abuse Longest period of sobriety (when/how long): Reports of no past or current use Negative Consequences of Use: (n/a) Withdrawal Symptoms: (n/a)  CIWA: CIWA-Ar BP: (!) 155/89 Pulse Rate: 89 COWS:    Allergies:  Allergies  Allergen Reactions  . Ditropan [Oxybutynin] Other (See Comments)    Hypotension and muscle weakness    Home Medications: (Not in a hospital admission)   OB/GYN Status:  No LMP for male patient.  General Assessment Data Location of Assessment: [facility] ED TTS Assessment: In system Is this a Tele or Face-to-Face Assessment?: Face-to-Face Is this an Initial Assessment or a  Re-assessment for this encounter?: Initial Assessment Language Other than English: No Living Arrangements: Other (Comment) What gender do you identify as?: Male Marital status: Married Pregnancy Status: No Living Arrangements: Spouse/significant other Can pt return to current living arrangement?: Yes Admission Status: Voluntary Is patient capable of signing voluntary admission?: Yes Referral Source: Self/Family/Friend Insurance type: Medicaid  Medical Screening Exam ([place]) Medical Exam completed: Yes  Crisis Care Plan Living Arrangements: Spouse/significant other Legal Guardian: Other:(Self) Name of Psychiatrist: Reports of none Name of Therapist: Reports of none  Education Status Is patient currently in school?: No Is the patient employed, unemployed or receiving disability?: Unemployed  Risk to self with the past 6 months Suicidal Ideation: No-Not Currently/Within Last 6 Months Has patient been a risk to self within the past 6 months prior to admission? : Yes Suicidal Intent: No Has patient had any suicidal intent within the past 6 months prior to admission? : No Is patient at risk for suicide?: Yes Suicidal Plan?: Yes-Currently Present Has patient had any suicidal plan within the past 6 months prior to admission? : Yes Specify Current Suicidal Plan: Thoughts of shooting self Access to Means: Yes Specify Access to Suicidal Means: Own a gun What has been your use of drugs/alcohol within the last 12 months?: Reports of none Previous Attempts/Gestures: No How many times?: 0 Other Self Harm Risks: Reports of none Triggers for Past Attempts: None known Intentional Self Injurious Behavior: None Family Suicide History: No Recent stressful life event(s): Conflict (Comment), Loss (Comment), Other (Comment) Persecutory voices/beliefs?: No Depression: Yes Depression Symptoms: Isolating, Feeling worthless/self pity, Feeling angry/irritable Substance abuse history  and/or treatment for substance abuse?: No Suicide prevention information given to non-admitted patients: Not applicable  Risk to Others within the past 6 months Homicidal Ideation: No Does patient have any lifetime risk of violence toward others beyond the six months prior to admission? : No Thoughts of Harm to Others: No Current Homicidal Intent: No Current Homicidal Plan: No Access to Homicidal Means: No Identified Victim: Reports of none History of harm to others?: No Violent Behavior Description: Reports of none Does patient have access to weapons?: No Criminal Charges Pending?: Yes Describe Pending Criminal Charges: Chief Strategy Officer with a minor Does patient have a court date: Yes Is patient on probation?: No  Psychosis Hallucinations: None noted Delusions: None noted  Mental Status Report Appearance/Hygiene: Unremarkable, In scrubs Eye Contact: Fair Motor Activity: Freedom of movement, Unremarkable Speech: Logical/coherent, Unremarkable Level of Consciousness: Alert Mood: Depressed, Sad, Pleasant Affect: Appropriate to circumstance Anxiety Level: Minimal Thought Processes: Coherent, Relevant Judgement: Unimpaired Orientation: Person, Place, Time, Situation, Appropriate for developmental age Obsessive Compulsive Thoughts/Behaviors: Minimal  Cognitive Functioning Concentration: Normal Memory: Recent Intact, Remote Intact Is patient IDD: No Insight: Fair Impulse Control: Fair Appetite: Good Have you had any weight changes? : No Change  Sleep: No Change Total Hours of Sleep: 8 Vegetative Symptoms: None  ADLScreening [facility] Assessment Services) Patient's cognitive ability adequate to safely complete daily activities?: Yes Patient able to express need for assistance with ADLs?: Yes Independently performs ADLs?: Yes (appropriate for developmental age)  Prior Inpatient Therapy Prior Inpatient Therapy: No  Prior Outpatient Therapy Prior Outpatient Therapy: No Does  patient have an ACCT team?: No Does patient have Intensive In-House Services?  : No Does patient have [facility] services? : No Does patient have P4CC services?: No  ADL Screening (condition at time of admission) Patient's cognitive ability adequate to safely complete daily activities?: Yes Is the patient deaf or have difficulty hearing?: No Does the patient have difficulty seeing, even when wearing glasses/contacts?: No Does the patient have difficulty concentrating, remembering, or making decisions?: No Patient able to express need for assistance with ADLs?: Yes Does the patient have difficulty dressing or bathing?: No Independently performs ADLs?: Yes (appropriate for developmental age) Does the patient have difficulty walking or climbing stairs?: No Weakness of Legs: None Weakness of Arms/Hands: None  Home Assistive Devices/Equipment Home Assistive Devices/Equipment: None  Therapy Consults (therapy consults require a physician order) PT Evaluation Needed: No OT Evalulation Needed: No SLP Evaluation Needed: No Abuse/Neglect Assessment (Assessment to be complete while patient is alone) Abuse/Neglect Assessment Can Be Completed: Yes Physical Abuse: Denies Verbal Abuse: Denies Sexual Abuse: Denies Exploitation of patient/patient's resources: Denies Self-Neglect: Denies Values / Beliefs Cultural Requests During Hospitalization: None Spiritual Requests During Hospitalization: None Consults Spiritual Care Consult Needed: No Social Work Consult Needed: No Regulatory affairs officer (For Healthcare) Does Patient Have a Medical Advance Directive?: No       Child/Adolescent Assessment Running Away Risk: Denies(Patient is an adult)  Disposition:  Disposition Initial Assessment Completed for this Encounter: Yes  On Site Evaluation by:   Reviewed with Physician:    Gunnar Fusi MS, LCAS, [employer], [place], [place] Therapeutic Triage Specialist 04/30/2018 5:00 PM

## 2018-04-30 NOTE — Progress Notes (Signed)
Patient blood pressure elevated, RN informed Physician, Metoprolol given. RN will inform incoming shift.

## 2018-05-01 DIAGNOSIS — F322 Major depressive disorder, single episode, severe without psychotic features: Secondary | ICD-10-CM

## 2018-05-01 MED ORDER — MIRTAZAPINE 15 MG PO TABS: 15.0000 mg | ORAL_TABLET | Freq: Every day | ORAL | Status: DC

## 2018-05-01 MED ORDER — MIRTAZAPINE 30 MG PO TABS: 30.0000 mg | ORAL_TABLET | Freq: Every day | ORAL | 1 refills | Status: DC

## 2018-05-01 MED ORDER — TRAZODONE HCL 50 MG PO TABS: 50.0000 mg | ORAL_TABLET | Freq: Every evening | ORAL | 1 refills | Status: DC | PRN

## 2018-05-01 NOTE — [facility] Suicide Risk Assessment (Signed)
[name] INPATIENT:  Family/Significant Other Suicide Prevention Education  Suicide Prevention Education:  Patient Refusal for Family/Significant Other Suicide Prevention Education: The patient Robert Short has refused to provide written consent for family/significant other to be provided Family/Significant Other Suicide Prevention Education during admission and/or prior to discharge.  Physician notified.  SPE completed with pt, as pt refused to consent to family contact. SPI pamphlet provided to pt and pt was encouraged to share information with support network, ask questions, and talk about any concerns relating to SPE. Pt denies access to guns/firearms and verbalized understanding of information provided. Mobile Crisis information also provided to pt.   Rozann Lesches 05/01/2018, 2:15 PM

## 2018-05-01 NOTE — Plan of Care (Signed)
Patient is alert and oriented X 4, denies SI, HI and AVH. Patient is irritable and angry about hospital admission. Patient states," My doctor told me to come over and just talk to somebody, I am physically sick not mentally." Patient also states he feels like his doctor did not tell him truthfully what would happen by coming to the hospital. Patient states. " I have been treated like a criminal, everything was taken from me." Patient is concerned about medications. Patient has elevated blood pressure, patient has a history of high blood pressure. Patient in constant pain due to cancer. Patient received fentanyl 100 MCG patch today. Patient did go outside with peers today. Patient is concerned and focused about discharge date. Problem: Education: Goal: Verbalization of understanding the information provided will improve Outcome: Progressing   Problem: Coping: Goal: Ability to verbalize frustrations and anger appropriately will improve Outcome: Progressing Goal: Ability to demonstrate self-control will improve Outcome: Progressing   Problem: Safety: Goal: Periods of time without injury will increase Outcome: Progressing   Problem: Self-Concept: Goal: Will verbalize positive feelings about self Outcome: Progressing Goal: Level of anxiety will decrease Outcome: Progressing

## 2018-05-01 NOTE — Progress Notes (Signed)
Patient is alert and oriented x 4, affect is sad, he was very tearful in the beginning, he stated " I want to leave l don't belong here"  Patient was consoled and offered emotional support, he was receptive and he stopped crying. Patient thoughts are organized, speech is pressured and there appear to be tension, he is also anxious and restless. Patient was complaint with medication regimen at night, he took the  Chemotherapy medication , temp is WNL afebrile no distress noted, 15 minutes safety checks maintained, will continue to monitor .

## 2018-05-01 NOTE — Discharge Summary (Signed)
Physician Discharge Summary Note  Patient:  Robert Short is an 68 y.o., male MRN:  [id number] DOB:  19-Apr-1950 Patient phone:  [phone] (home)  Patient address:   [place] [address],  Total Time spent with patient: 1 hour  Date of Admission:  04/30/2018 Date of Discharge: May 01, 2018  Reason for Admission: Admitted through the emergency room where he had been referred because of concern about suicidal thoughts.  Principal Problem: Severe major depression, single episode, without psychotic features [facility]) Discharge Diagnoses: Principal Problem:   Severe major depression, single episode, without psychotic features ([place]) Active Problems:   CFIDS (chronic fatigue and immune dysfunction syndrome) (HCC)   Essential (primary) hypertension   Chronic cervical pain   Neuropathy (HCC)   Prostate cancer metastatic to bone (HCC)   Suicidal ideation   Past Psychiatric History: No previous hospitalization.  No history of suicide attempts.  No history of psychosis.  He tells me he has been on several antidepressants prescribed by primary care doctors and has not felt like they were helpful.  Past Medical History:  Past Medical History:  Diagnosis Date  . Back pain 10/09/2012  . Bone cancer ([place])   . CAD (coronary artery disease)    a. 08/2015 PCI: LM nl, LAD 20d, LCX nl, RCA 10m RPAV 95 (3.0x18 Xience Alpine DES);  b. 04/2016 PCI ([facility]: RPL 95 (2.75x8 Promus Premier DES).  . Cancer associated pain   . Depression   . History of echocardiogram    a. 08/2016 Echo: EF 50-55%.  .Marland KitchenHistory of kidney stones   . Hyperlipidemia   . Hypertension   . Joint pain   . Prostate cancer ([facility]    a.  s/p prostatectomy ([facility]);  b. bone mets noted 04/2016.  . Right arm pain 01/10/2016  . Right foot pain 01/10/2016  . Right leg pain 01/10/2016  . Tobacco abuse     Past Surgical History:  Procedure Laterality Date  . CARDIAC CATHETERIZATION     [facility]  . CARDIAC CATHETERIZATION  N/A 08/16/2015   Procedure: Left Heart Cath and Coronary Angiography;  Surgeon: DYolonda Kida MD;  [facility];  Service: Cardiovascular;  Laterality: N/A;  . CARDIAC CATHETERIZATION N/A 08/16/2015   Procedure: Coronary Stent Intervention;  Surgeon: DYolonda Kida MD;  [facility];  Service: Cardiovascular;  Laterality: N/A;  . CERVIX SURGERY    . PROSTATECTOMY    . SPINE SURGERY    . TONSILLECTOMY    . WRIST SURGERY     Family History:  Family History  Problem Relation Age of Onset  . Heart attack Mother   . Hypertension Mother   . Heart attack Father    Family Psychiatric  History: Does not know of any of any relevance Social History:  Social History   Substance and Sexual Activity  Alcohol Use Yes  . Frequency: Never   Comment: occasionally     Social History   Substance and Sexual Activity  Drug Use No    Social History   Socioeconomic History  . Marital status: Married    Spouse name: Not on file  . Number of children: Not on file  . Years of education: Not on file  . Highest education level: Some college, no degree  Occupational History  . Occupation: Disabled    Comment: since 1999 - chronic back pain and DDD.  Social Needs  . Financial resource strain: Not hard  at all  . Food insecurity:    Worry: Never true    Inability: Never true  . Transportation needs:    Medical: No    Non-medical: No  Tobacco Use  . Smoking status: Current Every Day Smoker    Packs/day: 0.50    Years: 40.00    Pack years: 20.00    Types: Cigarettes  . Smokeless tobacco: Current User    Types: Chew  . Tobacco comment: 1 pack every couple days   Substance and Sexual Activity  . Alcohol use: Yes    Frequency: Never    Comment: occasionally  . Drug use: No  . Sexual activity: Not on file  Lifestyle  . Physical activity:    Days per week: 0 days    Minutes per session: 0 min  . Stress: Not at all  Relationships  . Social  connections:    Talks on phone: Once a week    Gets together: Once a week    Attends religious service: More than 4 times per year    Active member of club or organization: No    Attends meetings of clubs or organizations: Never    Relationship status: Married  Other Topics Concern  . Not on file  Social History Narrative   Lives in [place] with wife.  Does not routinely exercise.    Hospital Course: Patient admitted to the hospital.  15-minute checks in place.  Patient was noticed to be grumpy and irritable throughout his hospital stay.  Frequently expressed that he did not believe he needed to be in the hospital.  On interview the patient was very clear with me that he had no intention thought or plan of killing himself.  He says that he has had thoughts about sometimes wishing he were dead given all of his stress but has never tried to kill himself.  Once he calmed down a little bit he was able to acknowledge some anxiety and depression and that it would be useful to treat it.  He was open to medication and referral to outpatient therapy.  Patient did not engage in any violent or suicidal behavior in the hospital.  His medical medicines were continued as best could be done.  At the time of discharge patient is going to be started on mirtazapine which I will prescribe at 30 mg at night and give him a 59-monthsupply.  He will be given referral information to local providers in his insurance network.  Psychoeducation was completed.  I spoke with his wife on the telephone and she agreed to have the only firearm in the house removed from any access that he would have to it and she was comfortable with his coming home.  Physical Findings: AIMS:  , ,  ,  ,    CIWA:    COWS:     Musculoskeletal: Strength & Muscle Tone: within normal limits Gait & Station: normal Patient leans: N/A  Psychiatric Specialty Exam: Physical Exam  Nursing note and vitals reviewed. Constitutional: He appears  well-developed and well-nourished.  HENT:  Head: Normocephalic and atraumatic.  Eyes: Pupils are equal, round, and reactive to light. Conjunctivae are normal.  Neck: Normal range of motion.  Cardiovascular: Regular rhythm and normal heart sounds.  Respiratory: Effort normal.  GI: Soft.  Musculoskeletal: Normal range of motion.  Neurological: He is alert.  Skin: Skin is warm and dry.  Psychiatric: Judgment normal. His speech is delayed. He is slowed. Thought content is not  paranoid. Cognition and memory are normal. He exhibits a depressed mood. He expresses no homicidal and no suicidal ideation.    Review of Systems  Constitutional: Negative.   HENT: Negative.   Eyes: Negative.   Respiratory: Negative.   Cardiovascular: Negative.   Gastrointestinal: Negative.   Musculoskeletal: Positive for back pain, joint pain, myalgias and neck pain.  Skin: Negative.   Neurological: Negative.   Psychiatric/Behavioral: Positive for depression. Negative for hallucinations, substance abuse and suicidal ideas. The patient is nervous/anxious and has insomnia.     Blood pressure (!) 189/95, pulse 72, temperature 98.2 F (36.8 C), temperature source Oral, resp. rate 18, height _0  (1.803 m), weight 76.2 kg, SpO2 100 %.Body mass index is 23.43 kg/m.  General Appearance: Fairly Groomed  Eye Contact:  Fair  Speech:  Normal Rate  Volume:  Normal  Mood:  Irritable  Affect:  Congruent  Thought Process:  Goal Directed  Orientation:  Full (Time, Place, and Person)  Thought Content:  Logical  Suicidal Thoughts:  As noted previously the patient has stated that he has had thoughts of thinking he would be better off dead but has denied ever having any intention or plan of acting on this or harming himself.  Homicidal Thoughts:  No  Memory:  Immediate;   Fair Recent;   Fair Remote;   Fair  Judgement:  Fair  Insight:  Fair  Psychomotor Activity:  Decreased  Concentration:  Concentration: Fair  Recall:   [place] of Knowledge:  Fair  Language:  Fair  Akathisia:  No  Handed:  Right  AIMS (if indicated):     Assets:  Desire for Improvement Housing Resilience Social Support  ADL's:  Intact  Cognition:  WNL  Sleep:  Number of Hours: 8        Has this patient used any form of tobacco in the last 30 days? (Cigarettes, Smokeless Tobacco, Cigars, and/or Pipes) Yes, Yes, A prescription for an FDA-approved tobacco cessation medication was offered at discharge and the patient refused  Blood Alcohol level:  Lab Results  Component Value Date   ETH <10 29/92/4268    Metabolic Disorder Labs:  Lab Results  Component Value Date   HGBA1C 6.2 (H) 04/18/2018   MPG 131.24 04/18/2018   MPG 120 10/15/2017   No results found for: PROLACTIN Lab Results  Component Value Date   CHOL 171 10/15/2017   TRIG 181 (H) 10/15/2017   HDL 58 10/15/2017   CHOLHDL 2.9 10/15/2017   VLDL 41 (H) 02/23/2016   [place] 85 10/15/2017   LDLCALC 118 (H) 02/23/2016    See Psychiatric Specialty Exam and Suicide Risk Assessment completed by Attending Physician prior to discharge.  Discharge destination:  Home  Is patient on multiple antipsychotic therapies at discharge:  No   Has Patient had three or more failed trials of antipsychotic monotherapy by history:  No  Recommended Plan for Multiple Antipsychotic Therapies: NA  Discharge Instructions    Diet - low sodium heart healthy   Complete by:  As directed    Increase activity slowly   Complete by:  As directed      Allergies as of 05/01/2018      Reactions   Ditropan [oxybutynin] Other (See Comments)   Hypotension and muscle weakness      Medication List    STOP taking these medications   escitalopram 10 MG tablet Commonly known as:  LEXAPRO   metroNIDAZOLE 500 MG tablet Commonly known as:  FLAGYL  naloxone 4 MG/0.1ML Liqd nasal spray kit Commonly known as:  NARCAN   promethazine 12.5 MG tablet Commonly known as:  PHENERGAN      TAKE these medications     Indication  acetaminophen 500 MG tablet Commonly known as:  TYLENOL Take 1,000 mg by mouth every 8 (eight) hours as needed.  Indication:  Pain   albuterol 108 (90 Base) MCG/ACT inhaler Commonly known as:  VENTOLIN HFA Inhale 1-2 puffs into the lungs every 6 (six) hours as needed for wheezing or shortness of breath.  Indication:  Disease Involving Spasms of the Bronchus   amoxicillin-clavulanate 875-125 MG tablet Commonly known as:  AUGMENTIN Take 1 tablet by mouth 2 (two) times daily.  Indication:  Urinary Tract Infection   aspirin 81 MG tablet Take 81 mg by mouth daily.  Indication:  Stable Angina Pectoris   atorvastatin 80 MG tablet Commonly known as:  LIPITOR TAKE 1 TABLET (80 MG TOTAL) BY MOUTH DAILY AT 6 PM.  Indication:  High Amount of Fats in the Blood   Benefiber Powd Stir 2 tsp. TID into 4-8 oz of any non-carbonated beverage or soft food (hot or cold)  Indication:  Chronic Constipation, Constipation, Treatment for the Prevention of Constipation, Opioid-Induced Constipation   clopidogrel 75 MG tablet Commonly known as:  PLAVIX TAKE 1 TABLET BY MOUTH DAILY  Indication:  Ischemic Heart Disease   enzalutamide 40 MG capsule Commonly known as:  Xtandi Take 3 capsules (120 mg total) by mouth daily.  Indication:  Cancer of the Prostate Gland   ezetimibe 10 MG tablet Commonly known as:  ZETIA Take 1 tablet (10 mg total) by mouth daily.  Indication:  High Amount of Fats in the Blood   fentaNYL 100 MCG/HR Commonly known as:  [place] 1 patch onto the skin every other day. Along with 102mg patch for total of 125 mcg every 2 days.  Indication:  Chronic Pain   fentaNYL 25 MCG/HR Commonly known as:  DLansing1 patch onto the skin every other day. Use this along with fentanyl patch 100 mcg [total 125 mcg] every 2 days  Indication:  Chronic Pain   gabapentin 100 MG capsule Commonly known as:  NEURONTIN START 1 CAPSULE DAILY,  INCREASE BY 1 CAP EVERY 2-3 DAYS AS TOLERATED UP TO 3 TIMES A DAY, OR MAY TAKE 3 AT ONCE [place]  Indication:  Disease of the Peripheral Nerves   HYDROcodone-acetaminophen 10-325 MG tablet Commonly known as:  NORCO Take 1 tablet by mouth every 8 (eight) hours as needed.  Indication:  Pain   metoprolol tartrate 25 MG tablet Commonly known as:  LOPRESSOR Take 0.5 tablets (12.5 mg total) by mouth 2 (two) times daily.  Indication:  High Blood Pressure Disorder   mirtazapine 30 MG tablet Commonly known as:  REMERON Take 1 tablet (30 mg total) by mouth at bedtime.  Indication:  Major Depressive Disorder   nitroGLYCERIN 0.4 MG SL tablet Commonly known as:  NITROSTAT Place 0.4 mg under the tongue every 5 (five) minutes as needed.  Indication:  Acute Angina Pectoris   polyethylene glycol 17 g packet Commonly known as:  MIRALAX / GLYCOLAX Take 17 g by mouth daily as needed for moderate constipation.  Indication:  Constipation   traZODone 50 MG tablet Commonly known as:  DESYREL Take 1 tablet (50 mg total) by mouth at bedtime as needed and may repeat dose one time if needed for sleep.  Indication:  Trouble Sleeping  Follow-up recommendations:  Activity:  Activity as tolerated Diet:  Regular diet Other:  Follow-up with outpatient mental health and medical care  Comments: Prescription provided.  Patient will be given referral information to potential mental health follow-up options  Signed: Alethia Berthold, MD 05/01/2018, 2:40 PM

## 2018-05-01 NOTE — B[facility] Counselor (Signed)
CSW attempted to complete the patient's PSA however, pt became upset and yelled several times "If you can't get me the f*ck out of here, no!".  Patient slammed his hands on the rolling table.  Patient stated several times "They are holding me here against my God d*mn will. I am going to sue everyone for malpractice."  Pt did state that he was not upset with the CSW "jus the motherf*ckers that got me in here".    Pt declined PSA and any SPE contact at this time.  Assunta Curtis, MSW, LCSW 05/01/2018 10:02 AM

## 2018-05-01 NOTE — Progress Notes (Signed)
  [facility] Adult Case Management Discharge Plan :  Will you be returning to the same living situation after discharge:  Yes,  pt is returning home.  At discharge, do you have transportation home?: Yes,  pt has his car here.  Do you have the ability to pay for your medications: Yes,  [facility]  Release of information consent forms completed and in the chart;  Patient's signature needed at discharge.  Patient to Follow up at: Follow-up Information    [facility] Follow up.   Specialty:  Behavioral Health Why:  Please follow up to schedule an appointment if you change your mind.   Contact information: [address] [place] [phone]          Next level of care provider has access to [place] and Suicide Prevention discussed: No.  SPE completed with patient.  Patient declined family contact at this time.    Has patient been referred to the Quitline?: Patient refused referral  Patient has been referred for addiction treatment: Pt. refused referral  Rozann Lesches, LCSW 05/01/2018, 3:25 PM

## 2018-05-01 NOTE — H&P (Signed)
Psychiatric Admission Assessment Adult  Patient Identification: Robert Short MRN:  [id number] Date of Evaluation:  05/01/2018 Chief Complaint:  Depression Principal Diagnosis: Severe major depression, single episode, without psychotic features ([place]) Diagnosis:  Principal Problem:   Severe major depression, single episode, without psychotic features ([place]) Active Problems:   CFIDS (chronic fatigue and immune dysfunction syndrome) (HCC)   Essential (primary) hypertension   Chronic cervical pain   Neuropathy (HCC)   Prostate cancer metastatic to bone (HCC)   Suicidal ideation  History of Present Illness: Patient seen chart reviewed.  Also spoke with patient's wife.  This is a gentleman with a history of multiple medical problems who was referred to the emergency room by his cancer provider because of concern about suicidal ideation and was admitted to the psychiatric ward for the same.  Patient tells me that he has felt like he is under a very great amount of stress recently even worse than usual.  At least for a few months.  He has chronic pain problems which are not well controlled by his estimate despite multiple pain medicines.  On top of that apparently his stepdaughter in law has gotten charges filed against him for allegedly taking "indecent liberties" with his granddaughter.  Both the patient and the patient's wife absolutely deny that any such thing happened and believe that this is an attempt by the stepdaughter to alienate the patient from her daughter.  In any case it has obviously taken an overwhelming toll on the patient.  He sleeps poorly at night.  His appetite is not good.  He admits that at times he has had thoughts about wishing he were dead or feeling like he would rather die than go ahead with his problems but he denies that he has ever actually entertained any thought of killing himself.  He articulates religious reasons for this as well as a reasonable wish to spare pain to his  family members.  He admits to having one firearm at home which is an [employer] se[employer] which his wife is agreeable to getting out of his reach.  Patient denies hallucinations or psychotic symptoms denies homicidal ideation.  Had not been receiving any psychiatric treatment prior to admission. Associated Signs/Symptoms: Depression Symptoms:  depressed mood, anhedonia, insomnia, fatigue, difficulty concentrating, hopelessness, recurrent thoughts of death, anxiety, loss of energy/fatigue, disturbed sleep, (Hypo) Manic Symptoms:  None reported Anxiety Symptoms:  Excessive Worry, Psychotic Symptoms:  None reported PTSD Symptoms: Negative Total Time spent with patient: 1 hour  Past Psychiatric History: Patient has seen he says a counselor about 50 years ago.  Cannot remember why.  More recently his outpatient providers have tried prescribing antidepressants for him.  He did not tolerate Cymbalta never thought that it worked.  No previous hospitalizations no history of suicide attempts.  Is the patient at risk to self? No.  Has the patient been a risk to self in the past 6 months? No.  Has the patient been a risk to self within the distant past? No.  Is the patient a risk to others? No.  Has the patient been a risk to others in the past 6 months? No.  Has the patient been a risk to others within the distant past? No.   Prior Inpatient Therapy:   Prior Outpatient Therapy:    Alcohol Screening: 1. How often do you have a drink containing alcohol?: Monthly or less 2. How many drinks containing alcohol do you have on a typical day when you are drinking?:  1 or 2 3. How often do you have six or more drinks on one occasion?: Never AUDIT-C Score: 1 4. How often during the last year have you found that you were not able to stop drinking once you had started?: Never 5. How often during the last year have you failed to do what was normally expected from you becasue of drinking?: Never 6. How often  during the last year have you needed a first drink in the morning to get yourself going after a heavy drinking session?: Never 7. How often during the last year have you had a feeling of guilt of remorse after drinking?: Never 8. How often during the last year have you been unable to remember what happened the night before because you had been drinking?: Never 9. Have you or someone else been injured as a result of your drinking?: No 10. Has a relative or friend or a doctor or another health worker been concerned about your drinking or suggested you cut down?: No Alcohol Use Disorder Identification Test Final Score (AUDIT): 1 Alcohol Brief Interventions/Follow-up: AUDIT Score <7 follow-up not indicated, Alcohol Education Substance Abuse History in the last 12 months:  Yes.   Consequences of Substance Abuse: Negative Previous Psychotropic Medications: Yes  Psychological Evaluations: No  Past Medical History:  Past Medical History:  Diagnosis Date  . Back pain 10/09/2012  . Bone cancer ([place])   . CAD (coronary artery disease)    a. 08/2015 PCI: LM nl, LAD 20d, LCX nl, RCA 10m RPAV 95 (3.0x18 Xience Alpine DES);  b. 04/2016 PCI ([facility]: RPL 95 (2.75x8 Promus Premier DES).  . Cancer associated pain   . Depression   . History of echocardiogram    a. 08/2016 Echo: EF 50-55%.  .Marland KitchenHistory of kidney stones   . Hyperlipidemia   . Hypertension   . Joint pain   . Prostate cancer ([facility]    a.  s/p prostatectomy ([facility]);  b. bone mets noted 04/2016.  . Right arm pain 01/10/2016  . Right foot pain 01/10/2016  . Right leg pain 01/10/2016  . Tobacco abuse     Past Surgical History:  Procedure Laterality Date  . CARDIAC CATHETERIZATION     [facility]  . CARDIAC CATHETERIZATION N/A 08/16/2015   Procedure: Left Heart Cath and Coronary Angiography;  Surgeon: DYolonda Kida MD;  [facility];  Service: Cardiovascular;  Laterality: N/A;  . CARDIAC CATHETERIZATION N/A 08/16/2015    Procedure: Coronary Stent Intervention;  Surgeon: DYolonda Kida MD;  [facility];  Service: Cardiovascular;  Laterality: N/A;  . CERVIX SURGERY    . PROSTATECTOMY    . SPINE SURGERY    . TONSILLECTOMY    . WRIST SURGERY     Family History:  Family History  Problem Relation Age of Onset  . Heart attack Mother   . Hypertension Mother   . Heart attack Father    Family Psychiatric  History: Patient says he thinks he had an aunt with schizophrenia but does not know anything about it.  No known family history of suicide Tobacco Screening:   Social History:  Social History   Substance and Sexual Activity  Alcohol Use Yes  . Frequency: Never   Comment: occasionally     Social History   Substance and Sexual Activity  Drug Use No    Additional Social History:  Allergies:   Allergies  Allergen Reactions  . Ditropan [Oxybutynin] Other (See Comments)    Hypotension and muscle weakness   Lab Results:  Results for orders placed or performed during the hospital encounter of 04/30/18 (from the past 48 hour(s))  Comprehensive metabolic panel     Status: Abnormal   Collection Time: 04/30/18  2:08 PM  Result Value Ref Range   Sodium 136 135 - 145 mmol/L   Potassium 4.5 3.5 - 5.1 mmol/L   Chloride 104 98 - 111 mmol/L   CO2 23 22 - 32 mmol/L   Glucose, Bld 135 (H) 70 - 99 mg/dL   BUN 16 8 - 23 mg/dL   Creatinine, Ser 0.75 0.61 - 1.24 mg/dL   Calcium 9.5 8.9 - 10.3 mg/dL   Total Protein 6.9 6.5 - 8.1 g/dL   Albumin 4.1 3.5 - 5.0 g/dL   AST 18 15 - 41 U/L   ALT 12 0 - 44 U/L   Alkaline Phosphatase 42 38 - 126 U/L   Total Bilirubin 0.5 0.3 - 1.2 mg/dL   GFR calc non Af Amer >60 >60 mL/min   GFR calc Af Amer >60 >60 mL/min   Anion gap 9 5 - 15    Comment: Performed at [facility], [address] [place], [place] [address]  Ethanol     Status: None   Collection Time: 04/30/18  2:08 PM  Result Value Ref Range    Alcohol, Ethyl (B) <10 <10 mg/dL    Comment: (NOTE) Lowest detectable limit for serum alcohol is 10 mg/dL. For medical purposes only. Performed at [facility], [address] [place], [place] [address]   Salicylate level     Status: None   Collection Time: 04/30/18  2:08 PM  Result Value Ref Range   Salicylate Lvl <5.6 2.8 - 30.0 mg/dL    Comment: Performed at [facility], [address] [place], [place] [address]  Acetaminophen level     Status: None   Collection Time: 04/30/18  2:08 PM  Result Value Ref Range   Acetaminophen (Tylenol), Serum 23 10 - 30 ug/mL    Comment: (NOTE) Therapeutic concentrations vary significantly. A range of 10-30 ug/mL  may be an effective concentration for many patients. However, some  are best treated at concentrations outside of this range. Acetaminophen concentrations >150 ug/mL at 4 hours after ingestion  and >50 ug/mL at 12 hours after ingestion are often associated with  toxic reactions. Performed at [facility], [address] [place], [place] [address]   cbc     Status: Abnormal   Collection Time: 04/30/18  2:08 PM  Result Value Ref Range   WBC 8.9 4.0 - 10.5 K/uL   RBC 4.18 (L) 4.22 - 5.81 MIL/uL   Hemoglobin 13.1 13.0 - 17.0 g/dL   HCT 40.4 39.0 - 52.0 %   MCV 96.7 80.0 - 100.0 fL   MCH 31.3 26.0 - 34.0 pg   MCHC 32.4 30.0 - 36.0 g/dL   RDW 13.8 11.5 - 15.5 %   Platelets 182 150 - 400 K/uL   nRBC 0.0 0.0 - 0.2 %    Comment: Performed at [facility], [address], [place],  [address]  Urine Drug Screen, Qualitative     Status: Abnormal   Collection Time: 04/30/18  4:15 PM  Result Value Ref Range   Tricyclic, Ur Screen NONE DETECTED NONE DETECTED   Amphetamines, Ur Screen NONE DETECTED NONE DETECTED  MDMA (Ecstasy)Ur Screen NONE DETECTED NONE DETECTED   Cocaine Metabolite,Ur [place] NONE DETECTED NONE DETECTED   Opiate, Ur Screen POSITIVE (A) NONE DETECTED   Phencyclidine (PCP)  Ur S NONE DETECTED NONE DETECTED   Cannabinoid 50 Ng, Ur [place] NONE DETECTED NONE DETECTED   Barbiturates, Ur Screen NONE DETECTED NONE DETECTED   Benzodiazepine, Ur Scrn NONE DETECTED NONE DETECTED   Methadone Scn, Ur NONE DETECTED NONE DETECTED    Comment: (NOTE) Tricyclics + metabolites, urine    Cutoff 1000 ng/mL Amphetamines + metabolites, urine  Cutoff 1000 ng/mL MDMA (Ecstasy), urine              Cutoff 500 ng/mL Cocaine Metabolite, urine          Cutoff 300 ng/mL Opiate + metabolites, urine        Cutoff 300 ng/mL Phencyclidine (PCP), urine         Cutoff 25 ng/mL Cannabinoid, urine                 Cutoff 50 ng/mL Barbiturates + metabolites, urine  Cutoff 200 ng/mL Benzodiazepine, urine              Cutoff 200 ng/mL Methadone, urine                   Cutoff 300 ng/mL The urine drug screen provides only a preliminary, unconfirmed analytical test result and should not be used for non-medical purposes. Clinical consideration and professional judgment should be applied to any positive drug screen result due to possible interfering substances. A more specific alternate chemical method must be used in order to obtain a confirmed analytical result. Gas chromatography / mass spectrometry (GC/MS) is the preferred confirmat ory method. Performed at [facility], [address] [place], [place] [address]     Blood Alcohol level:  Lab Results  Component Value Date   [facility] <10 80/32/1224    Metabolic Disorder Labs:  Lab Results  Component Value Date   HGBA1C 6.2 (H) 04/18/2018   MPG 131.24 04/18/2018   MPG 120 10/15/2017   No results found for: PROLACTIN Lab Results  Component Value Date   CHOL 171 10/15/2017   TRIG 181 (H) 10/15/2017   HDL 58 10/15/2017   CHOLHDL 2.9 10/15/2017   VLDL 41 (H) 02/23/2016   LDLCALC 85 10/15/2017   LDLCALC 118 (H) 02/23/2016    Current Medications: Current Facility-Administered Medications  Medication Dose Route Frequency  Provider Last Rate Last Dose  . acetaminophen (TYLENOL) tablet 1,000 mg  1,000 mg Oral Q8H PRN Lavella Hammock, MD   1,000 mg at 05/01/18 1317  . albuterol (VENTOLIN HFA) 108 (90 Base) MCG/ACT inhaler 1-2 puff  1-2 puff Inhalation Q6H PRN Lavella Hammock, MD      . alum & mag hydroxide-simeth (MAALOX/MYLANTA) 200-200-20 MG/5ML suspension 30 mL  30 mL Oral Q4H PRN Lavella Hammock, MD      . amoxicillin-clavulanate (AUGMENTIN) 875-125 MG per tablet 1 tablet  1 tablet Oral BID Lavella Hammock, MD   1 tablet at 05/01/18 [phone]  . aspirin EC tablet 81 mg  81 mg Oral Daily Lavella Hammock, MD   81 mg at 05/01/18 0370  . atorvastatin (LIPITOR) tablet 80 mg  80 mg Oral q1800 Lavella Hammock, MD   80 mg at 04/30/18 1844  . clopidogrel (PLAVIX) tablet 75 mg  75 mg Oral Daily Lavella Hammock, MD   75 mg at 05/01/18 0854  .  enzalutamide Gillermina Phy) capsule 120 mg  120 mg Oral Daily Lavella Hammock, MD   120 mg at 05/01/18 1308  . ezetimibe (ZETIA) tablet 10 mg  10 mg Oral Daily Lavella Hammock, MD   10 mg at 05/01/18 0854  . [START ON 05/02/2018] fentaNYL (DURAGESIC) 100 MCG/HR 1 patch  1 patch Transdermal Q48H Lavella Hammock, MD   1 patch at 05/01/18 [phone]  . gabapentin (NEURONTIN) capsule 100 mg  100 mg Oral TID Lavella Hammock, MD   100 mg at 05/01/18 1317  . HYDROcodone-acetaminophen (NORCO) 10-325 MG per tablet 1 tablet  1 tablet Oral Q8H PRN Lavella Hammock, MD   1 tablet at 05/01/18 [phone]  . hydrOXYzine (ATARAX/VISTARIL) tablet 25 mg  25 mg Oral TID PRN Lavella Hammock, MD      . magnesium hydroxide (MILK OF MAGNESIA) suspension 30 mL  30 mL Oral Daily PRN Lavella Hammock, MD      . metoprolol tartrate (LOPRESSOR) tablet 12.5 mg  12.5 mg Oral BID Lavella Hammock, MD   12.5 mg at 05/01/18 2952  . metroNIDAZOLE (FLAGYL) tablet 500 mg  500 mg Oral TID Lavella Hammock, MD   500 mg at 05/01/18 1317  . mirtazapine (REMERON) tablet 15 mg  15 mg Oral QHS Clapacs, John T, MD      . nitroGLYCERIN  (NITROSTAT) SL tablet 0.4 mg  0.4 mg Sublingual Q5 min PRN Lavella Hammock, MD      . polyethylene glycol (MIRALAX / Floria Raveling) packet 17 g  17 g Oral Daily PRN Lavella Hammock, MD      . promethazine (PHENERGAN) tablet 12.5-25 mg  12.5-25 mg Oral Q8H PRN Lavella Hammock, MD      . psyllium (HYDROCIL/METAMUCIL) packet 1 packet  1 packet Oral q morning - 10a Lavella Hammock, MD   1 packet at 05/01/18 0935  . traZODone (DESYREL) tablet 50 mg  50 mg Oral QHS PRN,MR X 1 Lavella Hammock, MD   50 mg at 04/30/18 2205   PTA Medications: Medications Prior to Admission  Medication Sig Dispense Refill Last Dose  . acetaminophen (TYLENOL) 500 MG tablet Take 1,000 mg by mouth every 8 (eight) hours as needed.    Taking  . albuterol (PROVENTIL HFA;VENTOLIN HFA) 108 (90 Base) MCG/ACT inhaler Inhale 1-2 puffs into the lungs every 6 (six) hours as needed for wheezing or shortness of breath. 1 Inhaler 0 Taking  . amoxicillin-clavulanate (AUGMENTIN) 875-125 MG tablet Take 1 tablet by mouth 2 (two) times daily. 14 tablet 0 Taking  . aspirin 81 MG tablet Take 81 mg by mouth daily.   Taking  . atorvastatin (LIPITOR) 80 MG tablet TAKE 1 TABLET (80 MG TOTAL) BY MOUTH DAILY AT 6 PM. 90 tablet 1 Taking  . clopidogrel (PLAVIX) 75 MG tablet TAKE 1 TABLET BY MOUTH DAILY 90 tablet 0 Taking  . enzalutamide (XTANDI) 40 MG capsule Take 3 capsules (120 mg total) by mouth daily. 84 capsule 3 Taking  . escitalopram (LEXAPRO) 10 MG tablet Take 0.5 tablets (5 mg total) by mouth daily. 30 tablet 0   . ezetimibe (ZETIA) 10 MG tablet Take 1 tablet (10 mg total) by mouth daily. 90 tablet 3 Taking  . fentaNYL (DURAGESIC) 100 MCG/HR Place 1 patch onto the skin every other day. Along with 56mg patch for total of 125 mcg every 2 days. 15 patch 0   . fentaNYL (DURAGESIC) 25 MCG/HR Place 1 patch onto  the skin every other day. Use this along with fentanyl patch 100 mcg [total 125 mcg] every 2 days 15 patch 0   . gabapentin (NEURONTIN) 100  MG capsule START 1 CAPSULE DAILY, INCREASE BY 1 CAP EVERY 2-3 DAYS AS TOLERATED UP TO 3 TIMES A DAY, OR MAY TAKE 3 AT ONCE IN [place]. 90 capsule 5 Taking  . HYDROcodone-acetaminophen (NORCO) 10-325 MG tablet Take 1 tablet by mouth every 8 (eight) hours as needed. 90 tablet 0 Taking  . metoprolol tartrate (LOPRESSOR) 25 MG tablet Take 0.5 tablets (12.5 mg total) by mouth 2 (two) times daily. 90 tablet 1 Taking  . metroNIDAZOLE (FLAGYL) 500 MG tablet Take 1 tablet (500 mg total) by mouth 3 (three) times daily. Do not drink alcohol while taking this medicine. (Patient not taking: Reported on 03/15/2018) 21 tablet 0 Not Taking  . naloxone (NARCAN) nasal spray 4 mg/0.1 mL 1 spray into nostril upon signs of opioid overdose. Call 911. May repeat once if no response within 2-3 minutes. (Patient not taking: Reported on 04/19/2018) 1 kit 0 Not Taking  . nitroGLYCERIN (NITROSTAT) 0.4 MG SL tablet Place 0.4 mg under the tongue every 5 (five) minutes as needed.    Not Taking  . polyethylene glycol (MIRALAX / GLYCOLAX) packet Take 17 g by mouth daily as needed for moderate constipation.   Not Taking  . promethazine (PHENERGAN) 12.5 MG tablet Take 1-2 tablets (12.5-25 mg total) by mouth every 8 (eight) hours as needed for nausea or vomiting. 60 tablet 3 Taking  . Wheat Dextrin (BENEFIBER) POWD Stir 2 tsp. TID into 4-8 oz of any non-carbonated beverage or soft food (hot or cold) 500 g PRN Taking    Musculoskeletal: Strength & Muscle Tone: within normal limits Gait & Station: unsteady Patient leans: N/A  Psychiatric Specialty Exam: Physical Exam  Nursing note and vitals reviewed. Constitutional: He appears well-developed and well-nourished.  HENT:  Head: Normocephalic and atraumatic.  Eyes: Pupils are equal, round, and reactive to light. Conjunctivae are normal.  Neck: Normal range of motion.  Cardiovascular: Regular rhythm and normal heart sounds.  Respiratory: Effort normal.  GI: Soft.  Musculoskeletal:  Normal range of motion.  Neurological: He is alert.  Skin: Skin is warm and dry.  Psychiatric: Judgment normal. His mood appears anxious. His affect is blunt. His speech is delayed. He is agitated. He is not aggressive. Thought content is not paranoid. Cognition and memory are normal. He exhibits a depressed mood. He expresses no homicidal ideation. He expresses no suicidal plans.    Review of Systems  Constitutional: Negative.   HENT: Negative.   Eyes: Negative.   Respiratory: Negative.   Cardiovascular: Negative.   Gastrointestinal: Negative.   Musculoskeletal: Positive for back pain, joint pain, myalgias and neck pain.  Skin: Negative.   Neurological: Negative.   Psychiatric/Behavioral: Positive for depression and suicidal ideas. Negative for hallucinations, memory loss and substance abuse. The patient is nervous/anxious and has insomnia.     Blood pressure (!) 189/95, pulse 72, temperature 98.2 F (36.8 C), temperature source Oral, resp. rate 18, height _0  (1.803 m), weight 76.2 kg, SpO2 100 %.Body mass index is 23.43 kg/m.  General Appearance: Fairly Groomed  Eye Contact:  Fair  Speech:  Slow  Volume:  Decreased  Mood:  Dysphoric and Irritable  Affect:  Congruent  Thought Process:  Goal Directed  Orientation:  Full (Time, Place, and Person)  Thought Content:  Logical  Suicidal Thoughts:  Yes.  without intent/plan  Homicidal Thoughts:  No  Memory:  Immediate;   Fair Recent;   Fair Remote;   Fair  Judgement:  Fair  Insight:  Fair  Psychomotor Activity:  Decreased  Concentration:  Concentration: Fair  Recall:  [employer] of Knowledge:  Fair  Language:  Fair  Akathisia:  No  Handed:  Right  AIMS (if indicated):     Assets:  Communication Skills Desire for Improvement Housing Resilience Social Support  ADL's:  Intact  Cognition:  WNL  Sleep:  Number of Hours: 8    Treatment Plan Summary: Daily contact with patient to assess and evaluate symptoms and progress  in treatment, Medication management and Plan Patient admitted.  15-minute checks employed for safety.  After discussion I think I am going to recommend some changes to the antidepressant medicine.  Patient at this point does not appear to be at elevated risk outside the hospital enough to justify forcing him to stay in the hospital or initiating commitment.  Patient was able to calm down and talk appropriately about treatment pretty easily.  We are going to go ahead and follow through with plans for discharge with outpatient treatment today starting him on mirtazapine.  Observation Level/Precautions:  15 minute checks  Laboratory:  UA  Psychotherapy:    Medications:    Consultations:    Discharge Concerns:    Estimated LOS:  Other:     Physician Treatment Plan for Primary Diagnosis: Severe major depression, single episode, without psychotic features ([place]) Long Term Goal(s): Improvement in symptoms so as ready for discharge  Short Term Goals: Ability to disclose and discuss suicidal ideas and Ability to demonstrate self-control will improve  Physician Treatment Plan for Secondary Diagnosis: Principal Problem:   Severe major depression, single episode, without psychotic features ([place]) Active Problems:   CFIDS (chronic fatigue and immune dysfunction syndrome) ([place])   Essential (primary) hypertension   Chronic cervical pain   Neuropathy (HCC)   Prostate cancer metastatic to bone ([place])   Suicidal ideation  Long Term Goal(s): Improvement in symptoms so as ready for discharge  Short Term Goals: Compliance with prescribed medications will improve  I certify that inpatient services furnished can reasonably be expected to improve the patient's condition.    Alethia Berthold, MD 4/29/20202:21 PM

## 2018-05-01 NOTE — [facility] Group Notes (Signed)
LCSW Group Therapy Note  05/01/2018 1:00 PM  Type of Therapy/Topic:  Group Therapy:  Emotion Regulation  Participation Level:  Minimal   Description of Group:   The purpose of this group is to assist patients in learning to regulate negative emotions and experience positive emotions. Patients will be guided to discuss ways in which they have been vulnerable to their negative emotions. These vulnerabilities will be juxtaposed with experiences of positive emotions or situations, and patients will be challenged to use positive emotions to combat negative ones. Special emphasis will be placed on coping with negative emotions in conflict situations, and patients will process healthy conflict resolution skills.  Therapeutic Goals: 1. Patient will identify two positive emotions or experiences to reflect on in order to balance out negative emotions 2. Patient will label two or more emotions that they find the most difficult to experience 3. Patient will demonstrate positive conflict resolution skills through discussion and/or role plays  Summary of Patient Progress: Pt arrived late to group.  Patient did not engage in group discussion. Patient left group early stating "I am 68 years old, y'all don't want to hear me."  Patient was give the opportunity to speak and chose not to.   Therapeutic Modalities:   Cognitive Behavioral Therapy Feelings Identification Dialectical Behavioral Therapy  Assunta Curtis, MSW, LCSW 05/01/2018 2:16 PM

## 2018-05-01 NOTE — Progress Notes (Signed)
Recreation Therapy Notes  Date: 05/01/2018  Time: 9:30 am   Location: Craft room   Behavioral response: N/A   Intervention Topic: Communication  Discussion/Intervention: Patient did not attend group.   Clinical Observations/Feedback:  Patient did not attend group.   Reyn Faivre LRT/CTRS        Khloi Rawl 05/01/2018 11:31 AM

## 2018-05-01 NOTE — Plan of Care (Signed)
  Problem: Self-Concept: Goal: Will verbalize positive feelings about self Outcome: Progressing  Patient verbalized some feelings to staff.

## 2018-05-01 NOTE — BHH Suicide Risk Assessment (Signed)
[facility] Discharge Suicide Risk Assessment   Principal Problem: Severe major depression, single episode, without psychotic features ([place]) Discharge Diagnoses: Principal Problem:   Severe major depression, single episode, without psychotic features ([place]) Active Problems:   CFIDS (chronic fatigue and immune dysfunction syndrome) (HCC)   Essential (primary) hypertension   Chronic cervical pain   Neuropathy (HCC)   Prostate cancer metastatic to bone ([place])   Suicidal ideation   Total Time spent with patient: 1 hour  Musculoskeletal: Strength & Muscle Tone: within normal limits Gait & Station: normal Patient leans: N/A  Psychiatric Specialty Exam: Review of Systems  Constitutional: Negative.   HENT: Negative.   Eyes: Negative.   Respiratory: Negative.   Cardiovascular: Negative.   Gastrointestinal: Negative.   Musculoskeletal: Positive for back pain, joint pain, myalgias and neck pain.  Skin: Negative.   Neurological: Negative.   Psychiatric/Behavioral: Positive for depression. Negative for hallucinations, memory loss, substance abuse and suicidal ideas. The patient is nervous/anxious and has insomnia.     Blood pressure (!) 189/95, pulse 72, temperature 98.2 F (36.8 C), temperature source Oral, resp. rate 18, height 5\' 11"  (1.803 m), weight 76.2 kg, SpO2 100 %.Body mass index is 23.43 kg/m.  General Appearance: Fairly Groomed  Engineer, water::  Minimal  Speech:  Normal Rate409  Volume:  Normal  Mood:  Depressed, Dysphoric and Irritable  Affect:  Congruent  Thought Process:  Goal Directed  Orientation:  Full (Time, Place, and Person)  Thought Content:  Logical  Suicidal Thoughts:  Patient admits that at times he is entertaining thoughts of wishing he were dead because of his stress but he is absolutely clear with me that he has never considered actually trying to harm himself  Homicidal Thoughts:  No  Memory:  Immediate;   Fair Recent;   Fair Remote;   Fair  Judgement:  Fair   Insight:  Fair  Psychomotor Activity:  Decreased  Concentration:  Fair  Recall:  [employer] of Knowledge:Fair  Language: Fair  Akathisia:  No  Handed:  Right  AIMS (if indicated):     Assets:  Desire for Improvement Housing Resilience Social Support  Sleep:  Number of Hours: 8  Cognition: WNL  ADL's:  Intact   Mental Status Per Nursing Assessment::   On Admission:  Self-harm thoughts  Demographic Factors:  Male, Age 68 or older, Caucasian and Unemployed  Loss Factors: Decline in physical health and Legal issues  Historical Factors: NA  Risk Reduction Factors:   Sense of responsibility to family, Religious beliefs about death, Living with another person, especially a relative, Positive social support and Positive therapeutic relationship  Continued Clinical Symptoms:  Depression:   Anhedonia  Cognitive Features That Contribute To Risk:  Polarized thinking    Suicide Risk:  Minimal: No identifiable suicidal ideation.  Patients presenting with no risk factors but with morbid ruminations; may be classified as minimal risk based on the severity of the depressive symptoms    Plan Of Care/Follow-up recommendations:  Activity:  Activity as tolerated Diet:  Regular diet Other:  Follow-up with outpatient treatment in the community as recommended in current medicine.  Alethia Berthold, MD 05/01/2018, 2:30 PM

## 2018-05-01 NOTE — BHH Suicide Risk Assessment (Signed)
[facility] Admission Suicide Risk Assessment   Nursing information obtained from:  Patient Demographic factors:  Male, Age 68 or older, Caucasian Current Mental Status:  Self-harm thoughts Loss Factors:  Decline in physical health Historical Factors:  NA Risk Reduction Factors:  Positive social support  Total Time spent with patient: 1 hour Principal Problem: <principal problem not specified> Diagnosis:  Active Problems:   Suicidal ideation  Subjective Data: Patient seen and chart reviewed.  This is a older gentleman with a history of metastatic cancer and chronic pain who was referred to the hospital because of concern about suicidal ideation.  On interview today the patient appeared to be appropriately forthcoming and stated that while he has had thoughts about wishing he were dead related to his pain and his stress that he has never actually thought of harming himself.  He articulates religious and personal reasons for not doing this.  He denies any psychotic symptoms.  Denies homicidal ideation.  He is appropriate in conversation and shows insight into his condition.  Continued Clinical Symptoms:  Alcohol Use Disorder Identification Test Final Score (AUDIT): 1 The "Alcohol Use Disorders Identification Test", Guidelines for Use in Primary Care, Second Edition.  World Pharmacologist [facility]). Score between 0-7:  no or low risk or alcohol related problems. Score between 8-15:  moderate risk of alcohol related problems. Score between 16-19:  high risk of alcohol related problems. Score 20 or above:  warrants further diagnostic evaluation for alcohol dependence and treatment.   CLINICAL FACTORS:   Depression:   Anhedonia   Musculoskeletal: Strength & Muscle Tone: within normal limits Gait & Station: unsteady Patient leans: N/A  Psychiatric Specialty Exam: Physical Exam  Nursing note and vitals reviewed. Constitutional: He appears well-developed and well-nourished.  HENT:  Head:  Normocephalic and atraumatic.  Eyes: Pupils are equal, round, and reactive to light. Conjunctivae are normal.  Neck: Normal range of motion.  Cardiovascular: Regular rhythm and normal heart sounds.  Respiratory: Effort normal.  GI: Soft.  Musculoskeletal: Normal range of motion.  Neurological: He is alert.  Skin: Skin is warm and dry.  Psychiatric: His speech is normal. His affect is angry. He is agitated. He is not aggressive. Thought content is not paranoid. He expresses impulsivity. He exhibits a depressed mood. He expresses no homicidal and no suicidal ideation.    Review of Systems  Constitutional: Negative.   HENT: Negative.   Eyes: Negative.   Respiratory: Negative.   Cardiovascular: Negative.   Gastrointestinal: Negative.   Musculoskeletal: Positive for back pain, joint pain, myalgias and neck pain.  Skin: Negative.   Neurological: Negative.   Psychiatric/Behavioral: Positive for depression. Negative for hallucinations, memory loss, substance abuse and suicidal ideas. The patient is nervous/anxious and has insomnia.     Blood pressure (!) 189/95, pulse 72, temperature 98.2 F (36.8 C), temperature source Oral, resp. rate 18, height 5\' 11"  (1.803 m), weight 76.2 kg, SpO2 100 %.Body mass index is 23.43 kg/m.  General Appearance: Fairly Groomed  Eye Contact:  Minimal  Speech:  Slow  Volume:  Decreased  Mood:  Irritable  Affect:  Congruent  Thought Process:  Goal Directed  Orientation:  Full (Time, Place, and Person)  Thought Content:  Logical and Rumination  Suicidal Thoughts:  Yes.  without intent/plan  Homicidal Thoughts:  No  Memory:  Immediate;   Fair Recent;   Fair Remote;   Fair  Judgement:  Fair  Insight:  Fair  Psychomotor Activity:  Decreased  Concentration:  Concentration: Fair  Recall:  Smiley Houseman of Knowledge:  Fair  Language:  Fair  Akathisia:  No  Handed:  Right  AIMS (if indicated):     Assets:  Communication Skills Desire for  Improvement Housing Social Support  ADL's:  Intact  Cognition:  WNL  Sleep:  Number of Hours: 8      COGNITIVE FEATURES THAT CONTRIBUTE TO RISK:  Loss of executive function and Polarized thinking    SUICIDE RISK:   Mild:  Suicidal ideation of limited frequency, intensity, duration, and specificity.  There are no identifiable plans, no associated intent, mild dysphoria and related symptoms, good self-control (both objective and subjective assessment), few other risk factors, and identifiable protective factors, including available and accessible social support.  PLAN OF CARE: Patient seen and chart reviewed.  Spoke with patient at length.  Reviewed the admission situation.  Spoke with his wife as well.  Although the patient has some risk factors for suicide he is adamant that he would never actually proceed with killing himself and he is open to referral for appropriate treatment.  Patient is very uncomfortable being in the psychiatric ward.  Plan at this point is to discharge him with his wife is agreeable to.  Antidepressants are being added and he will be referred to an outpatient provider  I certify that inpatient services furnished can reasonably be expected to improve the patient's condition.   Alethia Berthold, MD 05/01/2018, 2:12 PM

## 2018-05-06 ENCOUNTER — Other Ambulatory Visit: Payer: Self-pay | Admitting: Hospice and Palliative Medicine

## 2018-05-06 ENCOUNTER — Telehealth: Payer: Self-pay | Admitting: *Deleted

## 2018-05-06 DIAGNOSIS — C61 Malignant neoplasm of prostate: Secondary | ICD-10-CM

## 2018-05-06 DIAGNOSIS — C7951 Secondary malignant neoplasm of bone: Principal | ICD-10-CM

## 2018-05-06 MED ORDER — PREDNISONE 5 MG PO TABS: 5.0000 mg | ORAL_TABLET | Freq: Every day | ORAL | 1 refills | Status: DC

## 2018-05-06 NOTE — Telephone Encounter (Signed)
Patient called asking for prescription for Prednisone which is no longer onhis medicine list. He had tried to refill at pharmacy, but was told prescription got transferred but he does not know where to. He states J Borders, NP talked to him about increasing the dose form current 10 mg dosing he had been taking. He states it helps with his pain level to be on it. Please advise

## 2018-05-06 NOTE — Telephone Encounter (Signed)
I called and spoke with patient. He reports improved moods. He requests that we restart the prednisone as his pain was better controlled on it. See note for full discussion regarding risks associated with chronic steroids. Refill sent.

## 2018-05-06 NOTE — Telephone Encounter (Signed)
Josh, please address

## 2018-05-06 NOTE — Progress Notes (Signed)
I spoke with patient by phone. He is requesting refill of his prednisone. He reports that the pain is significantly worse off steroids. We discussed the risks associated with chronic use of steroids and he requested that we restart prednisone for his comfort/quality of life.   Patient says that he is coping well and reports improved moods after speaking with an attorney. He was able to laugh with me on the phone.   Plan: Prednisone 5mg  once daily

## 2018-05-07 ENCOUNTER — Other Ambulatory Visit: Payer: Self-pay | Admitting: Hospice and Palliative Medicine

## 2018-05-07 MED ORDER — NALOXONE HCL 4 MG/0.1ML NA LIQD: NASAL | 0 refills | Status: AC

## 2018-05-07 NOTE — Progress Notes (Signed)
Naloxone kit for home

## 2018-05-09 ENCOUNTER — Other Ambulatory Visit: Payer: Self-pay

## 2018-05-09 ENCOUNTER — Ambulatory Visit: Payer: 59 | Admitting: Licensed Clinical Social Worker

## 2018-05-09 MED FILL — XTANDI 40 MG CAPSULE: 40 | 28 days supply | Qty: 84 | Fill #3

## 2018-05-13 ENCOUNTER — Inpatient Hospital Stay: Payer: 59 | Admitting: Internal Medicine

## 2018-05-13 ENCOUNTER — Ambulatory Visit (HOSPITAL_BASED_OUTPATIENT_CLINIC_OR_DEPARTMENT_OTHER): Payer: 59 | Admitting: Hospice and Palliative Medicine

## 2018-05-13 DIAGNOSIS — G893 Neoplasm related pain (acute) (chronic): Secondary | ICD-10-CM

## 2018-05-13 DIAGNOSIS — C61 Malignant neoplasm of prostate: Secondary | ICD-10-CM

## 2018-05-13 DIAGNOSIS — Z515 Encounter for palliative care: Secondary | ICD-10-CM | POA: Diagnosis not present

## 2018-05-13 DIAGNOSIS — C7951 Secondary malignant neoplasm of bone: Secondary | ICD-10-CM | POA: Diagnosis not present

## 2018-05-13 DIAGNOSIS — F329 Major depressive disorder, single episode, unspecified: Secondary | ICD-10-CM | POA: Diagnosis not present

## 2018-05-13 NOTE — Progress Notes (Signed)
Virtual Visit via Telephone Note  I connected with Robert Short on 05/13/18 at  3:00 PM EDT by telephone and verified that I am speaking with the correct person using two identifiers.   I discussed the limitations, risks, security and privacy concerns of performing an evaluation and management service by telephone and the availability of in person appointments. I also discussed with the patient that there may be a patient responsible charge related to this service. The patient expressed understanding and agreed to proceed.   History of Present Illness: Palliative Care consult requested for this68 y.o.malewith multiple medical problems including stage IV prostate cancer metastatic to bone currently on Xtandi. Patient has had rising PSA levels on treatment. Patient has had multiple symptoms including fatigue and pain. He was referred to palliative care to help with symptom management and to address goals.   Observations/Objective: I spoke with patient by phone. He missed his in clinic appointment today with Dr. Rogue Bussing. Patient says that he forgot. He denies any acute changes or concerns today. He says that his symptoms are under control. Pain is well managed on current regimen.   Patient still endorses depression, although not as severe as before. He also denies SI/HI. I called Dr. Waylan Boga office regarding follow up from inpatient stay. Referral was sent to [facility]. Unclear when patient is scheduled for f/u.   PDMP reviewed.  Assessment and Plan: -Refill Norco  (#90) -Continue fentanyl TD 173mcg Q72H -Will reschedule in clinic visits with Dr. Tish Men.  -Psych referral   I discussed the assessment and treatment plan with the patient. The patient was provided an opportunity to ask questions and all were answered. The patient agreed with the plan and demonstrated an understanding of the instructions.   The patient was advised to call back or seek an in-person  evaluation if the symptoms worsen or if the condition fails to improve as anticipated.  I provided 10 minutes of non-face-to-face time during this encounter.   Irean Hong, NP

## 2018-05-14 ENCOUNTER — Other Ambulatory Visit: Payer: Self-pay | Admitting: Family Medicine

## 2018-05-14 DIAGNOSIS — I10 Essential (primary) hypertension: Secondary | ICD-10-CM

## 2018-05-15 ENCOUNTER — Other Ambulatory Visit: Payer: Self-pay

## 2018-05-16 ENCOUNTER — Other Ambulatory Visit: Payer: Self-pay

## 2018-05-16 ENCOUNTER — Inpatient Hospital Stay: Payer: 59 | Attending: Internal Medicine | Admitting: Internal Medicine

## 2018-05-16 VITALS — BP 141/94 | HR 74 | Temp 97.4°F | Resp 20 | Ht 71.0 in | Wt 170.0 lb

## 2018-05-16 DIAGNOSIS — C61 Malignant neoplasm of prostate: Secondary | ICD-10-CM

## 2018-05-16 DIAGNOSIS — C7951 Secondary malignant neoplasm of bone: Secondary | ICD-10-CM

## 2018-05-16 DIAGNOSIS — Z9079 Acquired absence of other genital organ(s): Secondary | ICD-10-CM

## 2018-05-16 DIAGNOSIS — G893 Neoplasm related pain (acute) (chronic): Secondary | ICD-10-CM | POA: Diagnosis not present

## 2018-05-16 DIAGNOSIS — Z7289 Other problems related to lifestyle: Secondary | ICD-10-CM

## 2018-05-16 DIAGNOSIS — F329 Major depressive disorder, single episode, unspecified: Secondary | ICD-10-CM | POA: Diagnosis not present

## 2018-05-16 DIAGNOSIS — Z7902 Long term (current) use of antithrombotics/antiplatelets: Secondary | ICD-10-CM

## 2018-05-16 DIAGNOSIS — F17210 Nicotine dependence, cigarettes, uncomplicated: Secondary | ICD-10-CM | POA: Diagnosis not present

## 2018-05-16 DIAGNOSIS — Z79899 Other long term (current) drug therapy: Secondary | ICD-10-CM | POA: Diagnosis not present

## 2018-05-16 MED ORDER — HYDROCODONE-ACETAMINOPHEN 10-325 MG PO TABS: 1.0000 | ORAL_TABLET | Freq: Three times a day (TID) | ORAL | 0 refills | Status: DC | PRN

## 2018-05-16 MED ORDER — FENTANYL 25 MCG/HR TD PT72: 1.0000 | MEDICATED_PATCH | TRANSDERMAL | 0 refills | Status: DC

## 2018-05-16 MED ORDER — FENTANYL 100 MCG/HR TD PT72: 1.0000 | MEDICATED_PATCH | TRANSDERMAL | 0 refills | Status: DC

## 2018-05-16 NOTE — Progress Notes (Signed)
[facility] OFF[facility] PROGRESS NOTE  Patient Care Team: Olin Hauser, [name], [name], [name]  Cancer Staging No matching staging information was found for the patient.   Oncology History   # 2011- PROSTATE CANCER [Gleason 3+4]; s/p Prostatectomy [ also involved bladder neck/ECP; Dr.Polaseck]; July 2014- Biochemical recurrence [PSA 14]- started on Zoladex [Dr.Pandit]; lost to follow up.  # JAN 2017- STAGE IV METASTATIC PROSTATE Cancer to Bone- Feb 13th, 2017-  Lupron q 62m[~end of feb]; PSA: 1021; Declined Chemo; April 2017 [xofigo x6; Dr.Crystal]; AUG 2017- Zytiga + Prednisone BID. Bone scan-Jan 2018- improved skeletal metastases.   # MAY 1st 2019- START X-tandi [stopped zytiga/PSA- 8.8/rising]  # Mets to bone- start X-geva q 36M [May 30th]  # Smoker/ chronic pain/pain clinic   #[April 2018; [place]]- s/p stenting; on Plavix; --------------------------------------------------------------  DIAGNOSIS: '[ ]'  Castrate resistant prostate cancer  STAGE:   4    ;GOALS: Palliative  CURRENT/MOST RECENT THERAPY [April 2019]-Xtandi      [place]   12/08/2014 Initial Diagnosis    Prostate cancer metastatic to bone ([facility]       INTERVAL HISTORY:  Robert Short 68y.o.  male pleasant patient above history of metastatic castrate resistant prostate cancer currently on Xtandi is here for follow-up.  In the interim patient was admitted to the psych Inpatient unit for severe depression anxiety and suicidal ideation.  Patient is currently discharged home.  Currently feels much improved.  However he has not followed up with psychiatry as an outpatient.  He continues to complain of pain back in the joints; he is currently taking fentanyl/hydrocodone-he is holding steady.  Patient continues to  be a lot of stress from a legal standpoint- because of legal issues.  Review of Systems  Constitutional: Positive for malaise/fatigue and weight loss. Negative for chills, diaphoresis and fever.  HENT: Negative for nosebleeds and sore throat.   Eyes: Negative for double vision.  Respiratory: Positive for shortness of breath. Negative for cough, hemoptysis, sputum production and wheezing.   Cardiovascular: Negative for chest pain, palpitations, orthopnea and leg swelling.  Gastrointestinal: Positive for constipation. Negative for abdominal pain, blood in stool, diarrhea, heartburn, melena, nausea and vomiting.  Genitourinary: Negative for dysuria, frequency and urgency.  Musculoskeletal: Positive for back pain and joint pain.  Skin: Negative.  Negative for itching and rash.  Neurological: Negative for dizziness, tingling, focal weakness, weakness and headaches.  Endo/Heme/Allergies: Does not bruise/bleed easily.  Psychiatric/Behavioral: Negative for depression. The patient is not nervous/anxious and does not have insomnia.       PAST MEDICAL HISTORY :  Past Medical History:  Diagnosis Date  . Back pain 10/09/2012  . [place] cancer ([place]   . CAD (coronary artery disease)    a. 08/2015 PCI: LM nl, LAD 20d, LCX nl, RCA 230mRPAV 95 (3.0x18 Xience Alpine DES);  b. [facility]2.75x8 Promus Premier DES).  . Cancer associated pain   . Depression   . History of echocardiogram    a. 08/2016 Echo: EF 50-55%.  . Marland Kitchenistory of kidney stones   . Hyperlipidemia   . Hypertension   . Joint pain   . [facility]   a.  s/p prostatectomy ([facility]);  b. bone mets noted 04/2016.  . Right arm pain 01/10/2016  . Right foot pain  01/10/2016  . Right leg pain 01/10/2016  . Tobacco abuse     PAST SURGICAL HISTORY :   Past Surgical History:  Procedure Laterality Date  . CARDIAC CATHETERIZATION     [facility]  . CARDIAC CATHETERIZATION N/A 08/16/2015   Procedure: Left Heart Cath and  Coronary Angiography;  Surgeon: Yolonda Kida, MD;  Location: [place] CV [facility];  Service: Cardiovascular;  Laterality: N/A;  . CARDIAC CATHETERIZATION N/A 08/16/2015   Procedure: Coronary Stent Intervention;  Surgeon: Yolonda Kida, MD;  Location: [place] CV [facility];  Service: Cardiovascular;  Laterality: N/A;  . CERVIX SURGERY    . PROSTATECTOMY    . SPINE SURGERY    . TONSILLECTOMY    . WRIST SURGERY      FAMILY HISTORY :   Family History  Problem Relation Age of Onset  . Heart attack Mother   . Hypertension Mother   . Heart attack Father     SOCIAL HISTORY:   Social History   Tobacco Use  . Smoking status: Current Every Day Smoker    Packs/day: 0.50    Years: 40.00    Pack years: 20.00    Types: Cigarettes  . Smokeless tobacco: Current User    Types: Chew  . Tobacco comment: 1 pack every couple days   Substance Use Topics  . Alcohol use: Yes    Frequency: Never    Comment: occasionally  . Drug use: No    ALLERGIES:  is allergic to ditropan [oxybutynin].  MEDICATIONS:  Current Outpatient Medications  Medication Sig Dispense Refill  . acetaminophen (TYLENOL) 500 MG tablet Take 1,000 mg by mouth every 8 (eight) hours as needed.     Marland Kitchen albuterol (PROVENTIL HFA;VENTOLIN HFA) 108 (90 Base) MCG/ACT inhaler Inhale 1-2 puffs into the lungs every 6 (six) hours as needed for wheezing or shortness of breath. 1 Inhaler 0  . aspirin 81 MG tablet Take 81 mg by mouth daily.    Marland Kitchen atorvastatin (LIPITOR) 80 MG tablet TAKE 1 TABLET (80 MG TOTAL) BY MOUTH DAILY AT 6 PM. 90 tablet 1  . clopidogrel (PLAVIX) 75 MG tablet TAKE 1 TABLET BY MOUTH DAILY 90 tablet 0  . enzalutamide (XTANDI) 40 MG capsule Take 3 capsules (120 mg total) by mouth daily. 84 capsule 3  . fentaNYL (DURAGESIC) 100 MCG/HR Place 1 patch onto the skin every other day. Along with 20mg patch for total of 125 mcg every 2 days. 15 patch 0  . fentaNYL (DURAGESIC) 25 MCG/HR Place 1 patch onto the skin every other  day. Use this along with fentanyl patch 100 mcg [total 125 mcg] every 2 days 15 patch 0  . gabapentin (NEURONTIN) 100 MG capsule START 1 CAPSULE DAILY, INCREASE BY 1 CAP EVERY 2-3 DAYS AS TOLERATED UP TO 3 TIMES A DAY, OR MAY TAKE 3 AT ONCE IN [place] 90 capsule 5  . HYDROcodone-acetaminophen (NORCO) 10-325 MG tablet Take 1 tablet by mouth every 8 (eight) hours as needed. 90 tablet 0  . metoprolol tartrate (LOPRESSOR) 25 MG tablet TAKE 1/2 TABLET BY MOUTH 2 TIMES DAILY. 90 tablet 1  . mirtazapine (REMERON) 30 MG tablet Take 1 tablet (30 mg total) by mouth at bedtime. 30 tablet 1  . polyethylene glycol (MIRALAX / GLYCOLAX) packet Take 17 g by mouth daily as needed for moderate constipation.    . predniSONE (DELTASONE) 5 MG tablet Take 1 tablet (5 mg total) by mouth daily with breakfast. 30 tablet 1  . Wheat Dextrin (  BENEFIBER) POWD Stir 2 tsp. TID into 4-8 oz of any non-carbonated beverage or soft food (hot or cold) 500 g PRN  . naloxone (NARCAN) nasal spray 4 mg/0.1 mL 1 spray into nostril upon signs of opioid overdose. Call 911. May repeat once if no response within 2-3 minutes. (Patient not taking: Reported on 05/16/2018) 1 kit 0  . nitroGLYCERIN (NITROSTAT) 0.4 MG SL tablet Place 0.4 mg under the tongue every 5 (five) minutes as needed.      No current facility-administered medications for this visit.     PHYSICAL EXAMINATION: ECOG PERFORMANCE STATUS: 1 - Symptomatic but completely ambulatory  BP (!) 141/94 (BP Location: Left Arm, Patient Position: Sitting, Cuff Size: Normal)   Pulse 74   Temp (!) 97.4 F (36.3 C) (Tympanic)   Resp 20   Ht '5\' 11"'  (1.803 m)   Wt 170 lb (77.1 kg)   BMI 23.71 kg/m   Filed Weights   05/16/18 1100  Weight: 170 lb (77.1 kg)    Physical Exam  Constitutional: He is oriented to person, place, and time.   Frail-appearing Caucasian male patient.  He is walking with a cane.    HENT:  Head: Normocephalic and atraumatic.  Mouth/Throat: Oropharynx is clear  and moist. No oropharyngeal exudate.  Poor dentition.  Eyes: Pupils are equal, round, and reactive to light.  Neck: Normal range of motion. Neck supple.  Cardiovascular: Normal rate and regular rhythm.  Pulmonary/Chest: No respiratory distress. He has no wheezes.  Decreased breath sounds bilaterally at the bases.  No wheeze or crackles  Abdominal: Soft. Bowel sounds are normal. He exhibits no distension and no mass. There is no abdominal tenderness. There is no rebound and no guarding.  Musculoskeletal: Normal range of motion.        General: No tenderness or edema.  Neurological: He is alert and oriented to person, place, and time.  Skin: Skin is warm.  Psychiatric: Affect normal.       LABORATORY DATA:  I have reviewed the data as listed    Component Value Date/Time   NA 136 04/30/2018 1408   NA 140 07/13/2013 1542   K 4.5 04/30/2018 1408   K 4.3 07/13/2013 1542   CL 104 04/30/2018 1408   CL 107 07/13/2013 1542   CO2 23 04/30/2018 1408   CO2 26 07/13/2013 1542   GLUCOSE 135 (H) 04/30/2018 1408   GLUCOSE 106 (H) 07/13/2013 1542   BUN 16 04/30/2018 1408   BUN 11 07/13/2013 1542   CREATININE 0.75 04/30/2018 1408   CREATININE 0.76 10/15/2017 1001   CALCIUM 9.5 04/30/2018 1408   CALCIUM 9.9 07/13/2013 1542   PROT 6.9 04/30/2018 1408   PROT 8.1 07/13/2013 1542   ALBUMIN 4.1 04/30/2018 1408   ALBUMIN 4.3 07/13/2013 1542   AST 18 04/30/2018 1408   AST 26 07/13/2013 1542   ALT 12 04/30/2018 1408   ALT 32 07/13/2013 1542   ALKPHOS 42 04/30/2018 1408   ALKPHOS 74 07/13/2013 1542   BILITOT 0.5 04/30/2018 1408   BILITOT 0.4 07/13/2013 1542   GFRNONAA >60 04/30/2018 1408   GFRNONAA 95 10/15/2017 1001   GFRAA >60 04/30/2018 1408   GFRAA 110 10/15/2017 1001    No results found for: SPEP, UPEP  Lab Results  Component Value Date   WBC 8.9 04/30/2018   NEUTROABS 5.9 04/22/2018   HGB 13.1 04/30/2018   HCT 40.4 04/30/2018   MCV 96.7 04/30/2018   PLT 182 04/30/2018  Chemistry      Component Value Date/Time   NA 136 04/30/2018 1408   NA 140 07/13/2013 1542   K 4.5 04/30/2018 1408   K 4.3 07/13/2013 1542   CL 104 04/30/2018 1408   CL 107 07/13/2013 1542   CO2 23 04/30/2018 1408   CO2 26 07/13/2013 1542   BUN 16 04/30/2018 1408   BUN 11 07/13/2013 1542   CREATININE 0.75 04/30/2018 1408   CREATININE 0.76 10/15/2017 1001      Component Value Date/Time   CALCIUM 9.5 04/30/2018 1408   CALCIUM 9.9 07/13/2013 1542   ALKPHOS 42 04/30/2018 1408   ALKPHOS 74 07/13/2013 1542   AST 18 04/30/2018 1408   AST 26 07/13/2013 1542   ALT 12 04/30/2018 1408   ALT 32 07/13/2013 1542   BILITOT 0.5 04/30/2018 1408   BILITOT 0.4 07/13/2013 1542       RADIOGRAPHIC STUDIES: I have personally reviewed the radiological images as listed and agreed with the findings in the report. No results found.   ASSESSMENT & PLAN:  Prostate cancer metastatic to bone ([place]) # Castrate resistant prostate cancer metastases to bone. Lupron every 3 months [02/25/2018].rising worsening; pt admits to non-compliance.   # on Xtandi 3 pills a day; currently PSA- 45. Rising [as per pt non-compliance].  Continue the same.   # Depression/ anxiety-severe-Better now; on remeron.  No suicidal ideation.  Recommend follow up with psychiatry.   # Bone mets-hold Xgeva; last given on September 25, 2017. Hold off now sec to dental issues. .  # Malignancy related pain- continue fentanyl patch hydrocodone;. Continue fentanyl patch to every 48 hours and continue hydrocodone. Stable. New prescription sent.   # DISPOSITION: #Follow-up visit in 3 weeks-MD; labs- PSA/cbc/cmp;Lupron--Dr.B   Orders Placed This Encounter  Procedures  . CBC with Differential    Standing Status:   Future    Standing Expiration Date:   05/16/2019  . Comprehensive metabolic panel    Standing Status:   Future    Standing Expiration Date:   05/16/2019  . PSA    Standing Status:   Future    Standing Expiration Date:    05/16/2019   All questions were answered. The patient knows to call the clinic with any problems, questions or concerns.      [name], MD 05/16/2018 11:48 AM

## 2018-05-16 NOTE — Assessment & Plan Note (Addendum)
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 Lupron every 3 months [02/25/2018].rising worsening; pt admits to non-compliance.   # on Xtandi 3 pills a day; currently PSA- 45. Rising [as per pt non-compliance].  Continue the same.   # Depression/ anxiety-severe-Better now; on remeron.  No suicidal ideation.  Recommend follow up with psychiatry.   # Bone mets-hold Xgeva; last given on September 25, 2017. Hold off now sec to dental issues. .  # Malignancy related pain- continue fentanyl patch hydrocodone;. Continue fentanyl patch to every 48 hours and continue hydrocodone. Stable. New prescription sent.   # DISPOSITION: #Follow-up visit in 3 weeks-MD; labs- PSA/cbc/cmp;Lupron--Dr.B

## 2018-05-28 ENCOUNTER — Ambulatory Visit (INDEPENDENT_AMBULATORY_CARE_PROVIDER_SITE_OTHER): Payer: Self-pay | Admitting: Physician Assistant

## 2018-05-28 ENCOUNTER — Other Ambulatory Visit: Payer: Self-pay

## 2018-05-28 VITALS — BP 140/80 | HR 97 | Temp 98.4°F | Resp 16 | Wt 169.0 lb

## 2018-05-28 DIAGNOSIS — K056 Periodontal disease, unspecified: Secondary | ICD-10-CM

## 2018-05-28 DIAGNOSIS — K047 Periapical abscess without sinus: Secondary | ICD-10-CM

## 2018-05-28 MED ORDER — CLINDAMYCIN HCL 300 MG PO CAPS: 300.0000 mg | ORAL_CAPSULE | Freq: Three times a day (TID) | ORAL | 0 refills | Status: AC

## 2018-05-28 NOTE — Progress Notes (Addendum)
Patient ID: Irl Bodie Moger DOB: 04/30/1950 AGE: 68 y.o. MRN: [id number]   PCP: Olin Hauser, DO   Chief Complaint:  Chief Complaint  Patient presents with  . Dental Pain    2wks     Subjective:    HPI:  JYLAN LOEZA is a 68 y.o. male presents for evaluation  Chief Complaint  Patient presents with  . Dental Pain    49wks    68 year old male presents to [facility] with two week history of jaw/dental pain. Located at lower dental row, where incisors would be located. Associated swelling, at inferior aspect of jaw. Worse past three days. Pain 6/10 in severity. Still able to eat and drink. Patient felt warm yesterday (however, has chills/sweats from cancer). Has taken OTC Tylenol for pain relief. Patient also on oral Hydrocodone and Fentanyl patch for cancer malignancy pain. Denies fever via thermometer, headache, URI symptoms, sinus pain/pressure, sore throat, difficulty swallowing, chest pain, SOB, wheezing, cough, nausea/vomiting, diarrhea, abdominal pain. Denies gum bleeding or purulent drainage. Patient has been using previously prescribed Peridex oral rinse, applying ice, and brushing teeth regularly.  Patient with 2-3 year history of recurrent dental/jaw infection. Known poor dentition. Treated for dental abscesses. Patient is a current cigarette smoker. Currently 1/2 pack per day. Has been smoking for 40 years. Patient also chews tobacco; averaging 1 pack every few days. Followed by oral surgeon. Six months ago underwent surgical removal, via oral surgeon, of lower row of teeth. Has continued to have recurrent infections. Was on antibiotic, Augmentin and Flagyl, (repeat course) when seen by PCP via E-Visit on 04/19/18. Patient treated by PCP for dental infection/abscess on 03/08/18 with Augmentin and Metronidazole.  Patient states he has an appointment with his oral surgeon soon (verified with wife, who was waiting in car outside, believes appointment is June 14, 2018). Patient has been advised by oral surgeon to have jaw surgery, bone excision; suspect osteonecrosis is cause of recurrent infection. Patient not interested in jaw surgery; patient currently dying from stage IV prostate cancer. Patient with bone metastasis (causing chronic bone/back pain); however, not on Xgeva (last does 09/25/2017) due to dental complications.  Patient regularly followed by Dr. Nobie Putnam DO with [facility]. Last seen on 04/19/2018. Regularly followed by HTN and pre-DM2. Patient also regularly followed by Dr. Charlaine Dalton with [facility] for Stage IV prostate cancer and Dr. Ida Rogue with [facility] for CAD with history of stent placement (08/16/2015). Last A1C on 04/18/18, 6.2. Not on any medication therapy.  A limited review of symptoms was performed, pertinent positives and negatives as mentioned in HPI.  The following portions of the patient's history were reviewed and updated as appropriate: allergies, current medications and past medical history.  Patient Active Problem List   Diagnosis Date Noted  . Severe major depression, single episode, without psychotic features ([place]) 05/01/2018  . Suicidal ideation 04/30/2018  . Reaction, adjustment, with anxious, depressed mood 04/30/2018  . Chest pain 08/02/2016  . Pre-diabetes 07/18/2016  . Stable angina ([place]) 04/21/2016  . Nausea 01/17/2016  . History of ST elevation myocardial infarction (STEMI) 08/16/2015  . Neoplasm related pain 06/22/2015  . Cancer associated pain 02/04/2015  . Sebaceous cyst 01/18/2015  . Chronic neck pain 12/08/2014  . Cervical spondylosis 12/08/2014  . Chronic low back pain 12/08/2014  . Lumbar spondylosis 12/08/2014  . Chronic shoulder pain 12/08/2014  . Prostate cancer metastatic to bone ([place]) 12/08/2014  . Cancer-related pain 12/08/2014  .  Neurogenic pain 12/08/2014  . Musculoskeletal pain 12/08/2014  . Chronic pain 12/07/2014  . Long term  current use of opiate analgesic 12/07/2014  . Long term prescription opiate use 12/07/2014  . Opiate use 12/07/2014  . Encounter for therapeutic drug level monitoring 12/07/2014  . Encounter for chronic pain management 12/07/2014  . Therapeutic opioid-induced constipation (OIC) 12/07/2014  . Fatigue 08/27/2014  . CFIDS (chronic fatigue and immune dysfunction syndrome) ([place]) 06/04/2014  . Cervical spinal stenosis 06/04/2014  . Clinical depression 06/04/2014  . Essential (primary) hypertension 06/04/2014  . HLD (hyperlipidemia) 06/04/2014  . Amnesia 06/04/2014  . Chronic cervical pain 06/04/2014  . Neuropathy ([place]) 06/04/2014  . Loss of feeling or sensation 06/04/2014  . Pain in shoulder 06/04/2014  . Compulsive tobacco user syndrome 06/04/2014  . CAD (coronary artery disease) 10/09/2012  . Smoker 10/09/2012  . ED (erectile dysfunction) of organic origin 08/08/2012  . Acid reflux 08/08/2012  . Lower urinary tract symptoms 08/08/2012  . Restless leg 08/01/2010  . Back ache 08/01/2010    Allergies  Allergen Reactions  . Ditropan [Oxybutynin] Other (See Comments)    Hypotension and muscle weakness    Current Outpatient Medications on File Prior to Visit  Medication Sig Dispense Refill  . acetaminophen (TYLENOL) 500 MG tablet Take 1,000 mg by mouth every 8 (eight) hours as needed.     Marland Kitchen albuterol (PROVENTIL HFA;VENTOLIN HFA) 108 (90 Base) MCG/ACT inhaler Inhale 1-2 puffs into the lungs every 6 (six) hours as needed for wheezing or shortness of breath. 1 Inhaler 0  . aspirin 81 MG tablet Take 81 mg by mouth daily.    Marland Kitchen atorvastatin (LIPITOR) 80 MG tablet TAKE 1 TABLET (80 MG TOTAL) BY MOUTH DAILY AT 6 PM. 90 tablet 1  . chlorhexidine (PERIDEX) 0.12 % solution     . clopidogrel (PLAVIX) 75 MG tablet TAKE 1 TABLET BY MOUTH DAILY 90 tablet 0  . enzalutamide (XTANDI) 40 MG capsule Take 3 capsules (120 mg total) by mouth daily. 84 capsule 3  . fentaNYL (DURAGESIC) 100 MCG/HR Place 1  patch onto the skin every other day. Along with 53mg patch for total of 125 mcg every 2 days. 15 patch 0  . fentaNYL (DURAGESIC) 25 MCG/HR Place 1 patch onto the skin every other day. Use this along with fentanyl patch 100 mcg [total 125 mcg] every 2 days 15 patch 0  . gabapentin (NEURONTIN) 100 MG capsule START 1 CAPSULE DAILY, INCREASE BY 1 CAP EVERY 2-3 DAYS AS TOLERATED UP TO 3 TIMES A DAY, OR MAY TAKE 3 AT ONCE IN [place] 90 capsule 5  . HYDROcodone-acetaminophen (NORCO) 10-325 MG tablet Take 1 tablet by mouth every 8 (eight) hours as needed. 90 tablet 0  . metoprolol tartrate (LOPRESSOR) 25 MG tablet TAKE 1/2 TABLET BY MOUTH 2 TIMES DAILY. 90 tablet 1  . mirtazapine (REMERON) 30 MG tablet Take 1 tablet (30 mg total) by mouth at bedtime. 30 tablet 1  . nitroGLYCERIN (NITROSTAT) 0.4 MG SL tablet Place 0.4 mg under the tongue every 5 (five) minutes as needed.     . polyethylene glycol (MIRALAX / GLYCOLAX) packet Take 17 g by mouth daily as needed for moderate constipation.    . predniSONE (DELTASONE) 5 MG tablet Take 1 tablet (5 mg total) by mouth daily with breakfast. 30 tablet 1  . promethazine (PHENERGAN) 25 MG tablet     . naloxone (NARCAN) nasal spray 4 mg/0.1 mL 1 spray into nostril upon signs of opioid overdose.  Call 911. May repeat once if no response within 2-3 minutes. (Patient not taking: Reported on 05/16/2018) 1 kit 0  . Wheat Dextrin (BENEFIBER) POWD Stir 2 tsp. TID into 4-8 oz of any non-carbonated beverage or soft food (hot or cold) (Patient not taking: Reported on 05/28/2018) 500 g PRN   No current facility-administered medications on file prior to visit.        Objective:   Vitals:   05/28/18 1210  BP: 140/80  Pulse: 97  Resp: 16  Temp: 98.4 F (36.9 C)  SpO2: 97%     Wt Readings from Last 3 Encounters:  05/28/18 169 lb (76.7 kg)  05/16/18 170 lb (77.1 kg)  04/30/18 170 lb (77.1 kg)    Physical Exam:   General Appearance:  Patient sitting comfortably on  examination table. Conversational. Kermit Balo self-historian. In no acute distress. Afebrile.   Head:  Normocephalic, without obvious abnormality, atraumatic Facial inspection reveals mild edema of skin midline just inferior to jaw. No erythema. No ecchymosis. No gross deformity/abnormality of face. Tenderness with palpation at midline of jaw/chin. No palpable mass. No palpable fluctuance. No pain, crepitus, or stepoff with palpation along mandible sides, ramus, or at TMJ. No trismus. No stridor. No displacement of tongue. No firm/induration of floor of mouth.  Eyes:  PERRL, conjunctiva/corneas clear, EOM's intact  Ears:  Left ear canal WNL. No erythema or edema. No open wound. No visible purulent drainage. No tenderness with palpation over left tragus or with manipulation of left auricle. No visible erythema or edema of left mastoid. No tenderness with palpation over left mastoid. Right ear canal WNL. No erythema or edema. No open wound. No visible purulent drainage. No tenderness with palpation over right tragus or with manipulation of right auricle. No visible erythema or edema of right mastoid. No tenderness with palpation over right mastoid. Left TM WNL. Good light reflex. Visible landmarks. No erythema. No injection. No bulging or retraction. No visible perforation. No serous effusion. No visible purulent effusion. No tympanostomy tube. No scar tissue. Right TM WNL. Good light reflex. Visible landmarks. No erythema. No injection. No bulging or retraction. No visible perforation. No serous effusion. No visible purulent effusion. No tympanostomy tube. No scar tissue.  Nose: Nares normal. Septum midline. No visible polyps. No discharge. Normal mucosa. No sinus tenderness with percussion/palpation.  Throat: Lips, mucosa, and tongue normal. Poor dentition diffusely. Multiple missing teeth from upper row. Tobacco staining. Lower row of teeth absent. Gums with minimal edema and faint diffuse erythema. Center  portion, location of incisors, reveals visible bone exposure. No necrosis. No active bleeding or purulent drainage. Throat reveals no erythema. No postnasal drip. No visible cobblestoning. Tonsils are surgically absent. Uvula midline with no edema or erythema.  Neck: Supple, symmetrical, trachea midline, no anterior or posterior cervical lymphadenopathy  Lungs:   Clear to auscultation bilaterally, respirations unlabored. Good aeration. No rales, rhonchi, crackles or wheezing.  Heart:  Regular rate and rhythm, S1 and S2 normal, no murmur, rub, or gallop  Extremities: Extremities normal, atraumatic, no cyanosis or edema  Pulses: 2+ and symmetric  Skin: Skin color, texture, turgor normal, no rashes or lesions  Lymph nodes: Cervical, supraclavicular, and axillary nodes normal  Neurologic: Normal    Assessment & Plan:    Exam findings, diagnosis etiology and medication use and indications reviewed with patient. Follow-Up and discharge instructions provided. No emergent/urgent issues found on exam.  Patient education was provided.   Patient verbalized understanding of information provided and agrees with  plan of care (POC), all questions answered. The patient is advised to call or return to clinic if condition does not see an improvement in symptoms, or to seek the care of the closest emergency department if condition worsens with the below plan.    1. Dental abscess - clindamycin (CLEOCIN) 300 MG capsule; Take 1 capsule (300 mg total) by mouth 3 (three) times daily for 10 days.  Dispense: 30 capsule; Refill: 0  2. Periodontal disease  Patient with two week history of lower dental pain. History of recurrent infection. Resolved temporarily on Augmentin and Flagyl (prescribed 04/30/18, one month ago). Patient with f/u with oral surgeon on June 14, 2018. VSS (no current fever, tachycardia, or hypotension), afebrile, in no acute distress. Had long discussion with patient and patient's wife (lab tech at  [facility]). Patient's treatment should be through oral surgeon. Recurrent infection does indicate possible osteonecrosis and need for bone/jaw surgery. Patient is failing oral antibiotics. Patient currently stable, does not appear septic; though, complications with patient's stage IV prostate cancer. Also, at this time, do not believe patient has Ludwig's Angina; however, possible future complication.  Agreed to treat patient with another oral antibiotic, Clindamycin. Discussed risk of C. Diff, given multiple broad spectrum antibiotics. Advised close monitoring and immediate evaluation at ED if diarrhea presents.  Discussed concern for worsening osteonecrosis and/or sepsis. Advised close monitoring. If patient develops fever, chills, tachycardia, hypotension, lightheadedness, worsening jaw pain/swelling, or no improvement in 48 hours - should go to the ED for further evaluation. May need imaging of jaw, IV antibiotics, IV fluids, blood work, etc.  Advised patient maintain oral surgeon appointment and seriously consider further management of recurrent dental/jaw infection. Patient and patient's wife agreed with plan.   Darlin Priestly, MHS, PA-C Montey Hora, MHS, PA-C Advanced Practice Provider [facility]  [place], [place] [address] (p): [phone] [email or web address]

## 2018-05-28 NOTE — Patient Instructions (Addendum)
Thank you for choosing [facility] for your health care needs.  You have been diagnosed with: 1. Dental abscess 2. Periodontal disease  Take medications as prescribed. Clindamycin 300mg . 1 tab by mouth three times a day. Take antibiotic, Clindamycin, with food to prevent stomach upset. May eat yogurt or use an over the counter probiotic, such as Acidophilus.  May take over the counter Tylenol for pain, OR may use Vicodin (hydrocodone-acetaminophen). Do not take both together, will be too much Tylenol.  May apply ice. Continue prescription oral rinse/mouthwash (Peridex).  Follow-up with oral surgeon as scheduled in a few weeks.  Go directly to the ED if you develop fever, chills, sweats, worsening jaw pain, worsening jaw swelling, nausea/vomiting, diarrhea, or other new/concerning symptom.  Hope you feel better soon!   Dental Abscess  A dental abscess is an area of pus in or around a tooth. It comes from an infection. It can cause pain and other symptoms. Treatment will help with symptoms and prevent the infection from spreading. Follow these instructions at home: Medicines  Take over-the-counter and prescription medicines only as told by your dentist.  If you were prescribed an antibiotic medicine, take it as told by your dentist. Do not stop taking it even if you start to feel better.  If you were prescribed a gel that has numbing medicine in it, use it exactly as told.  Do not drive or use heavy machinery (like a Conservation officer, nature) while taking prescription pain medicine. General instructions  Rinse out your mouth often with salt water. ? To make salt water, dissolve -1 tsp of salt in 1 cup of warm water.  Eat a soft diet while your mouth is healing.  Drink enough fluid to keep your urine pale yellow.  Do not apply heat to the outside of your mouth.  Do not use any products that contain nicotine or tobacco. These include cigarettes and e-cigarettes. If you need help quitting,  ask your doctor.  Keep all follow-up visits as told by your dentist. This is important. Prevent an abscess  Brush your teeth every morning and every night. Use fluoride toothpaste.  Floss your teeth each day.  Get dental cleanings as often as told by your dentist.  Think about getting dental sealant put on teeth that have deep holes (decay).  Drink water that has fluoride in it. ? Most tap water has fluoride. ? Check the label on bottled water to see if it has fluoride in it.  Drink water instead of sugary drinks.  Eat healthy meals and snacks.  Wear a mouth guard or face shield when you play sports. Contact a doctor if:  Your pain is worse, and medicine does not help. Get help right away if:  You have a fever or chills.  Your symptoms suddenly get worse.  You have a very bad headache.  You have problems breathing or swallowing.  You have trouble opening your mouth.  You have swelling in your neck or close to your eye. Summary  A dental abscess is an area of pus in or around a tooth. It is caused by an infection.  Treatment will help with symptoms and prevent the infection from spreading.  Take over-the-counter and prescription medicines only as told by your dentist.  To prevent an abscess, take good care of your teeth. Brush your teeth every morning and night. Use floss every day.  Get dental cleanings as often as told by your dentist. This information is not intended to replace  advice given to you by your health care provider. Make sure you discuss any questions you have with your health care provider. Document Released: 05/05/2014 Document Revised: 08/21/2016 Document Reviewed: 08/21/2016 Elsevier Interactive Patient Education  2019 [employer].

## 2018-05-30 ENCOUNTER — Telehealth: Payer: Self-pay

## 2018-05-30 NOTE — Telephone Encounter (Signed)
Patient states he feels good.

## 2018-06-07 ENCOUNTER — Inpatient Hospital Stay: Payer: 59 | Attending: Internal Medicine

## 2018-06-07 ENCOUNTER — Encounter: Payer: Self-pay | Admitting: Internal Medicine

## 2018-06-07 ENCOUNTER — Inpatient Hospital Stay: Payer: 59

## 2018-06-07 ENCOUNTER — Inpatient Hospital Stay (HOSPITAL_BASED_OUTPATIENT_CLINIC_OR_DEPARTMENT_OTHER): Payer: 59 | Admitting: Internal Medicine

## 2018-06-07 ENCOUNTER — Other Ambulatory Visit: Payer: Self-pay

## 2018-06-07 VITALS — BP 143/100 | HR 96 | Temp 97.6°F | Resp 20 | Ht 71.0 in | Wt 167.0 lb

## 2018-06-07 DIAGNOSIS — G893 Neoplasm related pain (acute) (chronic): Secondary | ICD-10-CM

## 2018-06-07 DIAGNOSIS — F17210 Nicotine dependence, cigarettes, uncomplicated: Secondary | ICD-10-CM | POA: Diagnosis not present

## 2018-06-07 DIAGNOSIS — F418 Other specified anxiety disorders: Secondary | ICD-10-CM

## 2018-06-07 DIAGNOSIS — C7951 Secondary malignant neoplasm of bone: Secondary | ICD-10-CM

## 2018-06-07 DIAGNOSIS — Z79899 Other long term (current) drug therapy: Secondary | ICD-10-CM | POA: Insufficient documentation

## 2018-06-07 DIAGNOSIS — Z9119 Patient's noncompliance with other medical treatment and regimen: Secondary | ICD-10-CM | POA: Diagnosis not present

## 2018-06-07 DIAGNOSIS — Z5111 Encounter for antineoplastic chemotherapy: Secondary | ICD-10-CM | POA: Insufficient documentation

## 2018-06-07 DIAGNOSIS — C61 Malignant neoplasm of prostate: Secondary | ICD-10-CM | POA: Diagnosis not present

## 2018-06-07 DIAGNOSIS — K047 Periapical abscess without sinus: Secondary | ICD-10-CM | POA: Insufficient documentation

## 2018-06-07 LAB — COMPREHENSIVE METABOLIC PANEL
ALT: 13 U/L (ref 0–44)
AST: 19 U/L (ref 15–41)
Albumin: 3.8 g/dL (ref 3.5–5.0)
Alkaline Phosphatase: 54 U/L (ref 38–126)
Anion gap: 8 (ref 5–15)
BUN: 15 mg/dL (ref 8–23)
CO2: 25 mmol/L (ref 22–32)
Calcium: 9.3 mg/dL (ref 8.9–10.3)
Chloride: 105 mmol/L (ref 98–111)
Creatinine, Ser: 0.84 mg/dL (ref 0.61–1.24)
GFR calc Af Amer: >60 mL/min (ref >60)
GFR calc non Af Amer: >60 mL/min (ref >60)
Glucose, Bld: 135 mg/dL — ABNORMAL HIGH (ref 70–99)
Potassium: 3.9 mmol/L (ref 3.5–5.1)
Sodium: 138 mmol/L (ref 135–145)
Total Bilirubin: 0.4 mg/dL (ref 0.3–1.2)
Total Protein: 6.8 g/dL (ref 6.5–8.1)

## 2018-06-07 LAB — CBC WITH DIFFERENTIAL/PLATELET
Abs Immature Granulocytes: 0.04 10*3/uL (ref 0.00–0.07)
Basophils Absolute: 0 10*3/uL (ref 0.0–0.1)
Basophils Relative: 0 %
Eosinophils Absolute: 0.1 10*3/uL (ref 0.0–0.5)
Eosinophils Relative: 1 %
HCT: 39.6 % (ref 39.0–52.0)
Hemoglobin: 12.8 g/dL — ABNORMAL LOW (ref 13.0–17.0)
Immature Granulocytes: 1 %
Lymphocytes Relative: 15 %
Lymphs Abs: 1.1 10*3/uL (ref 0.7–4.0)
MCH: 30.8 pg (ref 26.0–34.0)
MCHC: 32.3 g/dL (ref 30.0–36.0)
MCV: 95.4 fL (ref 80.0–100.0)
Monocytes Absolute: 0.8 10*3/uL (ref 0.1–1.0)
Monocytes Relative: 10 %
Neutro Abs: 5.6 10*3/uL (ref 1.7–7.7)
Neutrophils Relative %: 73 %
Platelets: 218 10*3/uL (ref 150–400)
RBC: 4.15 MIL/uL — ABNORMAL LOW (ref 4.22–5.81)
RDW: 13.5 % (ref 11.5–15.5)
WBC: 7.7 10*3/uL (ref 4.0–10.5)
nRBC: 0 % (ref 0.0–0.2)

## 2018-06-07 LAB — PSA: Prostatic Specific Antigen: 64.19 ng/mL — ABNORMAL HIGH (ref 0.00–4.00)

## 2018-06-07 MED ORDER — LEUPROLIDE ACETATE (3 MONTH) 22.5 MG IM KIT
22.5000 mg | PACK | Freq: Once | INTRAMUSCULAR | Status: AC
  Administered 2018-06-07: 22.5 mg via INTRAMUSCULAR
  Filled 2018-06-07: qty 22.5

## 2018-06-07 NOTE — Progress Notes (Signed)
[facility] OFFICE PROGRESS NOTE  Patient Care Team: Olin Hauser, [name], [name], [name]  Cancer Staging No matching staging information was found for the patient.   Oncology History   # 2011- PROSTATE CANCER [Gleason 3+4]; s/p Prostatectomy [ also involved bladder neck/ECP; Dr.Polaseck]; July 2014- Biochemical recurrence [PSA 14]- started on Zoladex [Dr.Pandit]; lost to follow up.  # JAN 2017- STAGE IV METASTATIC PROSTATE Cancer to Bone- Feb 13th, 2017-  Lupron q 50m[~end of feb]; PSA: 1021; Declined Chemo; April 2017 [xofigo x6; Dr.Crystal]; AUG 2017- Zytiga + Prednisone BID. Bone scan-Jan 2018- improved skeletal metastases.   # MAY 1st 2019- START X-tandi [stopped zytiga/PSA- 8.8/rising]  # Mets to bone- start X-geva q 2M [May 30th]  # Smoker/ chronic pain/pain clinic   #[April 2018; [place]]- s/p stenting; on Plavix; --------------------------------------------------------------  DIAGNOSIS: _0  Castrate resistant prostate cancer  STAGE:   4    ;GOALS: Palliative  CURRENT/MOST RECENT THERAPY [April 2019]-Xtandi      Prostate cancer metastatic to bone ([facility]   12/08/2014 Initial Diagnosis    Prostate cancer metastatic to bone ([facility]       INTERVAL HISTORY:  Robert Short 68y.o.  male pleasant patient above history of metastatic castrate resistant prostate cancer currently on Xtandi is here for follow-up.  Patient complains of worsening pain in his jaw.  He was recently started on a second of antibiotics with clindamycin.  Pain is slightly improved.  Not resolved.  Is awaiting to see dental surgeon.  Otherwise his appetite is fair.  Denies any nausea vomiting.  Denies any further episodes of suicidal ideation.  Continues chronic joint pains back pain.  Review of Systems  Constitutional: Positive  for malaise/fatigue and weight loss. Negative for chills, diaphoresis and fever.  HENT: Negative for nosebleeds and sore throat.   Eyes: Negative for double vision.  Respiratory: Positive for shortness of breath. Negative for cough, hemoptysis, sputum production and wheezing.   Cardiovascular: Negative for chest pain, palpitations, orthopnea and leg swelling.  Gastrointestinal: Positive for constipation. Negative for abdominal pain, blood in stool, diarrhea, heartburn, melena, nausea and vomiting.  Genitourinary: Negative for dysuria, frequency and urgency.  Musculoskeletal: Positive for back pain and joint pain.  Skin: Negative.  Negative for itching and rash.  Neurological: Negative for dizziness, tingling, focal weakness, weakness and headaches.  Endo/Heme/Allergies: Does not bruise/bleed easily.  Psychiatric/Behavioral: Negative for depression. The patient is not nervous/anxious and does not have insomnia.       PAST MEDICAL HISTORY :  Past Medical History:  Diagnosis Date  . Back pain 10/09/2012  . Bone cancer ([facility]   . CAD (coronary artery disease)    a. 08/2015 PCI: LM nl, LAD 20d, LCX nl, RCA 21mRPAV 95 (3.0x18 Xience Alpine DES);  b. [facility]2.75x8 Promus Premier DES).  . Cancer associated pain   . Depression   . History of echocardiogram    a. 08/2016 Echo: EF 50-55%.  . Marland Kitchenistory of kidney stones   . Hyperlipidemia   . Hypertension   . Joint pain   . [facility]   a.  s/p prostatectomy ([facility]);  b. bone mets noted 04/2016.  . Right arm pain 01/10/2016  . Right foot pain 01/10/2016  . Right leg pain 01/10/2016  . Tobacco abuse     PAST  SURGICAL HISTORY :   Past Surgical History:  Procedure Laterality Date  . CARDIAC CATHETERIZATION     [facility]  . CARDIAC CATHETERIZATION N/A 08/16/2015   Procedure: Left Heart Cath and Coronary Angiography;  Surgeon: Yolonda Kida, MD;  Location: [place] [facility];  Service: Cardiovascular;   Laterality: N/A;  . CARDIAC CATHETERIZATION N/A 08/16/2015   Procedure: Coronary Stent Intervention;  Surgeon: Yolonda Kida, MD;  Location: [place] [facility];  Service: Cardiovascular;  Laterality: N/A;  . CERVIX SURGERY    . PROSTATECTOMY    . SPINE SURGERY    . TONSILLECTOMY    . WRIST SURGERY      FAMILY HISTORY :   Family History  Problem Relation Age of Onset  . Heart attack Mother   . Hypertension Mother   . Heart attack Father     SOCIAL HISTORY:   Social History   Tobacco Use  . Smoking status: Current Every Day Smoker    Packs/day: 0.50    Years: 40.00    Pack years: 20.00    Types: Cigarettes  . Smokeless tobacco: Current User    Types: Chew  . Tobacco comment: 1 pack every couple days   Substance Use Topics  . Alcohol use: Yes    Frequency: Never    Comment: occasionally  . Drug use: No    ALLERGIES:  is allergic to ditropan [oxybutynin].  MEDICATIONS:  Current Outpatient Medications  Medication Sig Dispense Refill  . acetaminophen (TYLENOL) 500 MG tablet Take 1,000 mg by mouth every 8 (eight) hours as needed.     Marland Kitchen albuterol (PROVENTIL HFA;VENTOLIN HFA) 108 (90 Base) MCG/ACT inhaler Inhale 1-2 puffs into the lungs every 6 (six) hours as needed for wheezing or shortness of breath. 1 Inhaler 0  . aspirin 81 MG tablet Take 81 mg by mouth daily.    Marland Kitchen atorvastatin (LIPITOR) 80 MG tablet TAKE 1 TABLET (80 MG TOTAL) BY MOUTH DAILY AT 6 PM. 90 tablet 1  . chlorhexidine (PERIDEX) 0.12 % solution     . clindamycin (CLEOCIN) 300 MG capsule Take 1 capsule (300 mg total) by mouth 3 (three) times daily for 10 days. 30 capsule 0  . clopidogrel (PLAVIX) 75 MG tablet TAKE 1 TABLET BY MOUTH DAILY 90 tablet 0  . enzalutamide (XTANDI) 40 MG capsule Take 3 capsules (120 mg total) by mouth daily. 84 capsule 3  . fentaNYL (DURAGESIC) 100 MCG/HR Place 1 patch onto the skin every other day. Along with 64mg patch for total of 125 mcg every 2 days. 15 patch 0  . fentaNYL  (DURAGESIC) 25 MCG/HR Place 1 patch onto the skin every other day. Use this along with fentanyl patch 100 mcg [total 125 mcg] every 2 days 15 patch 0  . gabapentin (NEURONTIN) 100 MG capsule START 1 CAPSULE DAILY, INCREASE BY 1 CAP EVERY 2-3 DAYS AS TOLERATED UP TO 3 TIMES A DAY, OR MAY TAKE 3 AT ONCE IN [place] 90 capsule 5  . HYDROcodone-acetaminophen (NORCO) 10-325 MG tablet Take 1 tablet by mouth every 8 (eight) hours as needed. 90 tablet 0  . metoprolol tartrate (LOPRESSOR) 25 MG tablet TAKE 1/2 TABLET BY MOUTH 2 TIMES DAILY. 90 tablet 1  . mirtazapine (REMERON) 30 MG tablet Take 1 tablet (30 mg total) by mouth at bedtime. 30 tablet 1  . polyethylene glycol (MIRALAX / GLYCOLAX) packet Take 17 g by mouth daily as needed for moderate constipation.    . predniSONE (DELTASONE) 5 MG  tablet Take 1 tablet (5 mg total) by mouth daily with breakfast. 30 tablet 1  . promethazine (PHENERGAN) 25 MG tablet     . Wheat Dextrin (BENEFIBER) POWD Stir 2 tsp. TID into 4-8 oz of any non-carbonated beverage or soft food (hot or cold) 500 g PRN  . naloxone (NARCAN) nasal spray 4 mg/0.1 mL 1 spray into nostril upon signs of opioid overdose. Call 911. May repeat once if no response within 2-3 minutes. (Patient not taking: Reported on 05/16/2018) 1 kit 0  . nitroGLYCERIN (NITROSTAT) 0.4 MG SL tablet Place 0.4 mg under the tongue every 5 (five) minutes as needed.      No current facility-administered medications for this visit.     PHYSICAL EXAMINATION: ECOG PERFORMANCE STATUS: 1 - Symptomatic but completely ambulatory  BP (!) 143/100   Pulse 96   Temp 97.6 F (36.4 C) (Tympanic)   Resp 20   Ht _0  (1.803 m)   Wt 167 lb (75.8 kg)   BMI 23.29 kg/m   Filed Weights   06/07/18 1423  Weight: 167 lb (75.8 kg)    Physical Exam  Constitutional: He is oriented to person, place, and time.   Frail-appearing Caucasian male patient.  He is walking with a cane.    HENT:  Head: Normocephalic and atraumatic.   Mouth/Throat: Oropharynx is clear and moist. No oropharyngeal exudate.  Poor dentition.  Eyes: Pupils are equal, round, and reactive to light.  Neck: Normal range of motion. Neck supple.  Cardiovascular: Normal rate and regular rhythm.  Pulmonary/Chest: No respiratory distress. He has no wheezes.  Decreased breath sounds bilaterally at the bases.  No wheeze or crackles  Abdominal: Soft. Bowel sounds are normal. He exhibits no distension and no mass. There is no abdominal tenderness. There is no rebound and no guarding.  Musculoskeletal: Normal range of motion.        General: No tenderness or edema.  Neurological: He is alert and oriented to person, place, and time.  Skin: Skin is warm.  Psychiatric: Affect normal.       LABORATORY DATA:  I have reviewed the data as listed    Component Value Date/Time   NA 138 06/07/2018 1358   NA 140 07/13/2013 1542   K 3.9 06/07/2018 1358   K 4.3 07/13/2013 1542   CL 105 06/07/2018 1358   CL 107 07/13/2013 1542   CO2 25 06/07/2018 1358   CO2 26 07/13/2013 1542   GLUCOSE 135 (H) 06/07/2018 1358   GLUCOSE 106 (H) 07/13/2013 1542   BUN 15 06/07/2018 1358   BUN 11 07/13/2013 1542   CREATININE 0.84 06/07/2018 1358   CREATININE 0.76 10/15/2017 1001   CALCIUM 9.3 06/07/2018 1358   CALCIUM 9.9 07/13/2013 1542   PROT 6.8 06/07/2018 1358   PROT 8.1 07/13/2013 1542   ALBUMIN 3.8 06/07/2018 1358   ALBUMIN 4.3 07/13/2013 1542   AST 19 06/07/2018 1358   AST 26 07/13/2013 1542   ALT 13 06/07/2018 1358   ALT 32 07/13/2013 1542   ALKPHOS 54 06/07/2018 1358   ALKPHOS 74 07/13/2013 1542   BILITOT 0.4 06/07/2018 1358   BILITOT 0.4 07/13/2013 1542   GFRNONAA >60 06/07/2018 1358   GFRNONAA 95 10/15/2017 1001   GFRAA >60 06/07/2018 1358   GFRAA 110 10/15/2017 1001    No results found for: SPEP, UPEP  Lab Results  Component Value Date   WBC 7.7 06/07/2018   NEUTROABS 5.6 06/07/2018   HGB 12.8 (L) 06/07/2018  HCT 39.6 06/07/2018   MCV 95.4  06/07/2018   PLT 218 06/07/2018      Chemistry      Component Value Date/Time   NA 138 06/07/2018 1358   NA 140 07/13/2013 1542   K 3.9 06/07/2018 1358   K 4.3 07/13/2013 1542   CL 105 06/07/2018 1358   CL 107 07/13/2013 1542   CO2 25 06/07/2018 1358   CO2 26 07/13/2013 1542   BUN 15 06/07/2018 1358   BUN 11 07/13/2013 1542   CREATININE 0.84 06/07/2018 1358   CREATININE 0.76 10/15/2017 1001      Component Value Date/Time   CALCIUM 9.3 06/07/2018 1358   CALCIUM 9.9 07/13/2013 1542   ALKPHOS 54 06/07/2018 1358   ALKPHOS 74 07/13/2013 1542   AST 19 06/07/2018 1358   AST 26 07/13/2013 1542   ALT 13 06/07/2018 1358   ALT 32 07/13/2013 1542   BILITOT 0.4 06/07/2018 1358   BILITOT 0.4 07/13/2013 1542       RADIOGRAPHIC STUDIES: I have personally reviewed the radiological images as listed and agreed with the findings in the report. No results found.   ASSESSMENT & PLAN:  Prostate cancer metastatic to bone ([place]) # Castrate resistant prostate cancer metastases to bone. Lupron every 3 months [06/07/2018].  PSA is rising.  Clinical progression noted.  Patient admits to noncompliance with Xtandi.;  Recommend compliance.  #Continue Xtandi 3 pills a day; await PSA from today.  # Dental infection- on Clindamycin; recommend evaluation with dental surgeon Dr. Romilda Garret sooner.  # Depression/ anxiety-currently better no evidence of suicidal ideation.  Continue Remeron.  # Bone mets-hold Xgeva; last given on September 25, 2017. Hold off now sec to dental issues.  Stable.  # Malignancy related pain- continue fentanyl patch hydrocodone;. Continue fentanyl patch to every 48 hours and continue hydrocodone.  Stable. Will call for refill.   # DISPOSITION: add TSH today labs-  # Lupron today #Follow-up visit in 5 weeks-MD; labs- PSA/cbc/cmp--Dr.B   No orders of the defined types were placed in this encounter.  All questions were answered. The patient knows to call the clinic with any  problems, questions or concerns.      [name], MD 06/07/2018 3:42 PM

## 2018-06-07 NOTE — Assessment & Plan Note (Addendum)
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 Lupron every 3 months [06/07/2018].  PSA is rising.  Clinical progression noted.  Patient admits to noncompliance with Xtandi.;  Recommend compliance.  #Continue Xtandi 3 pills a day; await PSA from today.  # Dental infection- on Clindamycin; recommend evaluation with dental surgeon Dr. Romilda Garret sooner.  # Depression/ anxiety-currently better no evidence of suicidal ideation.  Continue Remeron.  # Bone mets-hold Xgeva; last given on September 25, 2017. Hold off now sec to dental issues.  Stable.  # Malignancy related pain- continue fentanyl patch hydrocodone;. Continue fentanyl patch to every 48 hours and continue hydrocodone.  Stable. Will call for refill.   # DISPOSITION: add TSH today labs-  # Lupron today #Follow-up visit in 5 weeks-MD; labs- PSA/cbc/cmp--Dr.B

## 2018-06-12 ENCOUNTER — Other Ambulatory Visit: Payer: Self-pay | Admitting: Internal Medicine

## 2018-06-12 DIAGNOSIS — C7951 Secondary malignant neoplasm of bone: Secondary | ICD-10-CM

## 2018-06-12 DIAGNOSIS — G893 Neoplasm related pain (acute) (chronic): Secondary | ICD-10-CM

## 2018-06-12 DIAGNOSIS — C61 Malignant neoplasm of prostate: Secondary | ICD-10-CM

## 2018-06-12 MED ORDER — HYDROCODONE-ACETAMINOPHEN 10-325 MG PO TABS: 1.0000 | ORAL_TABLET | Freq: Three times a day (TID) | ORAL | 0 refills | Status: DC | PRN

## 2018-06-12 MED ORDER — FENTANYL 100 MCG/HR TD PT72: 1.0000 | MEDICATED_PATCH | TRANSDERMAL | 0 refills | Status: DC

## 2018-06-12 MED ORDER — FENTANYL 25 MCG/HR TD PT72: 1.0000 | MEDICATED_PATCH | TRANSDERMAL | 0 refills | Status: DC

## 2018-06-12 NOTE — Telephone Encounter (Signed)
Patient called cancer center requesting refill of Fentanyl 75 mcg/hr q 72 hours and Hydrocodone 10-325 mg q 8 hours for pain.   As mandated by the [place] STOP Act (Strengthen Opioid Misuse Prevention), the  Controlled Substance Reporting System ([place]) was reviewed for this patient.  Below is the past 3-months of controlled substance prescriptions as displayed by the registry.  I have personally consulted with my supervising physician, Dr. Rogue Bussing, who agrees that continuation of opiate therapy is medically appropriate at this time and agrees to provide continual monitoring, including urine/blood drug screens, as indicated. Refill is appropriate on or after 06/14/18.  NCCSRS reviewed: No red flags. Appropriate. Reviewed.    Faythe Casa, NP 06/12/2018 10:23 AM [phone]

## 2018-06-17 ENCOUNTER — Ambulatory Visit: Payer: 59 | Admitting: Licensed Clinical Social Worker

## 2018-06-17 ENCOUNTER — Other Ambulatory Visit: Payer: Self-pay

## 2018-06-17 ENCOUNTER — Other Ambulatory Visit: Payer: Self-pay | Admitting: Internal Medicine

## 2018-06-17 DIAGNOSIS — C61 Malignant neoplasm of prostate: Secondary | ICD-10-CM

## 2018-06-18 MED FILL — XTANDI 40 MG CAPSULE: 40 | 28 days supply | Qty: 84 | Fill #0

## 2018-06-20 ENCOUNTER — Other Ambulatory Visit: Payer: Self-pay | Admitting: Family Medicine

## 2018-06-20 DIAGNOSIS — R232 Flushing: Secondary | ICD-10-CM

## 2018-07-09 ENCOUNTER — Other Ambulatory Visit: Payer: Self-pay | Admitting: Oncology

## 2018-07-09 DIAGNOSIS — G893 Neoplasm related pain (acute) (chronic): Secondary | ICD-10-CM

## 2018-07-09 DIAGNOSIS — C7951 Secondary malignant neoplasm of bone: Secondary | ICD-10-CM

## 2018-07-09 DIAGNOSIS — C61 Malignant neoplasm of prostate: Secondary | ICD-10-CM

## 2018-07-10 ENCOUNTER — Ambulatory Visit: Payer: 59 | Admitting: Licensed Clinical Social Worker

## 2018-07-10 ENCOUNTER — Other Ambulatory Visit: Payer: Self-pay | Admitting: Oncology

## 2018-07-10 ENCOUNTER — Other Ambulatory Visit: Payer: Self-pay

## 2018-07-10 ENCOUNTER — Other Ambulatory Visit: Payer: Self-pay | Admitting: *Deleted

## 2018-07-10 DIAGNOSIS — C61 Malignant neoplasm of prostate: Secondary | ICD-10-CM

## 2018-07-10 DIAGNOSIS — C7951 Secondary malignant neoplasm of bone: Secondary | ICD-10-CM

## 2018-07-10 DIAGNOSIS — G893 Neoplasm related pain (acute) (chronic): Secondary | ICD-10-CM

## 2018-07-10 MED ORDER — FENTANYL 25 MCG/HR TD PT72: 1.0000 | MEDICATED_PATCH | TRANSDERMAL | 0 refills | Status: DC

## 2018-07-10 MED ORDER — FENTANYL 100 MCG/HR TD PT72: 1.0000 | MEDICATED_PATCH | TRANSDERMAL | 0 refills | Status: DC

## 2018-07-10 MED ORDER — HYDROCODONE-ACETAMINOPHEN 10-325 MG PO TABS: 1.0000 | ORAL_TABLET | Freq: Three times a day (TID) | ORAL | 0 refills | Status: DC | PRN

## 2018-07-10 NOTE — Telephone Encounter (Signed)
Patient called cancer center requesting refill of Fantanyl 125 mcg q 48 hours and Norco 10-325 mg q 8 hours PRN for pain. Last refilled on 06/12/18.  As mandated by the [place] STOP Act (Strengthen Opioid Misuse Prevention), the [place] Controlled Substance Reporting System ([place]) was reviewed for this patient. Below is the past 55-months of controlled substance prescriptions as displayed by the registry. I have personally consulted with my supervising physician, Dr. Grayland Ormond, who agrees that continuation of opiate therapy is medically appropriate at this time and agrees to provide continual monitoring, including urine/blood drug screens, as indicated. Refill is appropriate on or after 07/11/18.  NCCSRS reviewed: Reviewed and Ok to refill per PDMP.     Faythe Casa, NP 07/10/2018 9:01 AM [phone]

## 2018-07-12 ENCOUNTER — Inpatient Hospital Stay (HOSPITAL_BASED_OUTPATIENT_CLINIC_OR_DEPARTMENT_OTHER): Payer: 59 | Admitting: Internal Medicine

## 2018-07-12 ENCOUNTER — Other Ambulatory Visit: Payer: Self-pay

## 2018-07-12 ENCOUNTER — Encounter: Payer: Self-pay | Admitting: Internal Medicine

## 2018-07-12 ENCOUNTER — Inpatient Hospital Stay: Payer: 59 | Attending: Internal Medicine

## 2018-07-12 DIAGNOSIS — C7951 Secondary malignant neoplasm of bone: Secondary | ICD-10-CM

## 2018-07-12 DIAGNOSIS — C61 Malignant neoplasm of prostate: Secondary | ICD-10-CM | POA: Diagnosis not present

## 2018-07-12 DIAGNOSIS — G893 Neoplasm related pain (acute) (chronic): Secondary | ICD-10-CM | POA: Diagnosis not present

## 2018-07-12 DIAGNOSIS — L723 Sebaceous cyst: Secondary | ICD-10-CM | POA: Diagnosis not present

## 2018-07-12 DIAGNOSIS — Z79899 Other long term (current) drug therapy: Secondary | ICD-10-CM | POA: Insufficient documentation

## 2018-07-12 DIAGNOSIS — L089 Local infection of the skin and subcutaneous tissue, unspecified: Secondary | ICD-10-CM | POA: Insufficient documentation

## 2018-07-12 LAB — COMPREHENSIVE METABOLIC PANEL
ALT: 9 U/L (ref 0–44)
AST: 17 U/L (ref 15–41)
Albumin: 3.7 g/dL (ref 3.5–5.0)
Alkaline Phosphatase: 42 U/L (ref 38–126)
Anion gap: 8 (ref 5–15)
BUN: 13 mg/dL (ref 8–23)
CO2: 26 mmol/L (ref 22–32)
Calcium: 9.4 mg/dL (ref 8.9–10.3)
Chloride: 105 mmol/L (ref 98–111)
Creatinine, Ser: 0.71 mg/dL (ref 0.61–1.24)
GFR calc Af Amer: >60 mL/min (ref >60)
GFR calc non Af Amer: >60 mL/min (ref >60)
Glucose, Bld: 124 mg/dL — ABNORMAL HIGH (ref 70–99)
Potassium: 4.5 mmol/L (ref 3.5–5.1)
Sodium: 139 mmol/L (ref 135–145)
Total Bilirubin: 0.4 mg/dL (ref 0.3–1.2)
Total Protein: 6.5 g/dL (ref 6.5–8.1)

## 2018-07-12 LAB — CBC WITH DIFFERENTIAL/PLATELET
Abs Immature Granulocytes: 0.02 10*3/uL (ref 0.00–0.07)
Basophils Absolute: 0 10*3/uL (ref 0.0–0.1)
Basophils Relative: 1 %
Eosinophils Absolute: 0 10*3/uL (ref 0.0–0.5)
Eosinophils Relative: 1 %
HCT: 36.1 % — ABNORMAL LOW (ref 39.0–52.0)
Hemoglobin: 11.7 g/dL — ABNORMAL LOW (ref 13.0–17.0)
Immature Granulocytes: 0 %
Lymphocytes Relative: 13 %
Lymphs Abs: 0.8 10*3/uL (ref 0.7–4.0)
MCH: 30.9 pg (ref 26.0–34.0)
MCHC: 32.4 g/dL (ref 30.0–36.0)
MCV: 95.3 fL (ref 80.0–100.0)
Monocytes Absolute: 0.4 10*3/uL (ref 0.1–1.0)
Monocytes Relative: 7 %
Neutro Abs: 5.3 10*3/uL (ref 1.7–7.7)
Neutrophils Relative %: 78 %
Platelets: 174 10*3/uL (ref 150–400)
RBC: 3.79 MIL/uL — ABNORMAL LOW (ref 4.22–5.81)
RDW: 14 % (ref 11.5–15.5)
WBC: 6.7 10*3/uL (ref 4.0–10.5)
nRBC: 0 % (ref 0.0–0.2)

## 2018-07-12 LAB — PSA: Prostatic Specific Antigen: 93.24 ng/mL — ABNORMAL HIGH (ref 0.00–4.00)

## 2018-07-12 MED ORDER — AMOXICILLIN 500 MG PO CAPS: 500.0000 mg | ORAL_CAPSULE | Freq: Three times a day (TID) | ORAL | 0 refills | Status: DC

## 2018-07-12 NOTE — Assessment & Plan Note (Addendum)
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 Lupron every 3 months [06/07/2018].  PSA is rising- 64; he continues to admit noncompliance with Xtandi.  #Continue Xtandi 3 pills a day; await PSA from today; reminded the importance of compliance.  # Dental infection- s/p Clindamycin- improved.  Continue to hold off Xgeva.  # Depression/ anxiety-currently better no evidence of suicidal ideation.  Continue Remeron.  # Bone mets-hold Xgeva; last given on September 25, 2017. Hold off now sec to dental issues.  Stable  # infected sebaceous cyst- recommend Amoxicillin; and follow up with PCP   # Malignancy related pain- continue fentanyl patch hydrocodone;. Continue fentanyl patch to every 48 hours and continue hydrocodone.  Stable  # DISPOSITION:  #Follow-up visit in 6 weeks-MD; labs-2-3 days prior-[ PSA/cbc/cmp]--Dr.B

## 2018-07-14 NOTE — Progress Notes (Signed)
[place] OFFICE PROGRESS NOTE  Patient Care Team: Olin Hauser, [name], [name], [name]  Cancer Staging No matching staging information was found for the patient.   Oncology History Overview Note  # 2011- PROSTATE CANCER [Gleason 3+4]; s/p Prostatectomy [ also involved bladder neck/ECP; Dr.Polaseck]; July 2014- Biochemical recurrence [PSA 14]- started on Zoladex [Dr.Pandit]; lost to follow up.  # JAN 2017- STAGE IV METASTATIC PROSTATE Cancer to Bone- Feb 13th, 2017-  Lupron q 84m[~end of feb]; PSA: 1021; Declined Chemo; April 2017 [xofigo x6; Dr.Crystal]; AUG 2017- Zytiga + Prednisone BID. Bone scan-Jan 2018- improved skeletal metastases.   # MAY 1st 2019- START X-tandi [stopped zytiga/PSA- 8.8/rising]  # Mets to bone- start X-geva q 27M [May 30th]  # Smoker/ chronic pain/pain clinic   #[April 2018; [place]]- s/p stenting; on Plavix; --------------------------------------------------------------  DIAGNOSIS: _0  Castrate resistant prostate cancer  STAGE:   4    ;GOALS: Palliative  CURRENT/MOST RECENT THERAPY [April 2019]-Xtandi    [place]  12/08/2014 Initial Diagnosis   Pro[facility] ([facility]       INTERVAL HISTORY:  Robert Short 68y.o.  male pleasant patient above history of metastatic castrate resistant prostate cancer currently on Xtandi is here for follow-up.  Patient continues to admit to noncompliance with Xtandi.  He admits to continued stress from social issues.  He denies any worsening depression denies any suicidal ideation.  Patient status post antibiotics for his jaw infection/status post antibiotics resolved.   Otherwise his appetite is fair.  Denies any nausea vomiting.  Continues chronic joint pains back pain.  He states his pain is  fairly well controlled.  He notes to have a cystlike lesion on the left side of the neck; draining pus.  No fever chills.  Review of Systems  Constitutional: Positive for malaise/fatigue and weight loss. Negative for chills, diaphoresis and fever.  HENT: Negative for nosebleeds and sore throat.   Eyes: Negative for double vision.  Respiratory: Positive for shortness of breath. Negative for cough, hemoptysis, sputum production and wheezing.   Cardiovascular: Negative for chest pain, palpitations, orthopnea and leg swelling.  Gastrointestinal: Positive for constipation. Negative for abdominal pain, blood in stool, diarrhea, heartburn, melena, nausea and vomiting.  Genitourinary: Negative for dysuria, frequency and urgency.  Musculoskeletal: Positive for back pain and joint pain.  Skin: Negative.  Negative for itching and rash.  Neurological: Negative for dizziness, tingling, focal weakness, weakness and headaches.  Endo/Heme/Allergies: Does not bruise/bleed easily.  Psychiatric/Behavioral: Negative for depression. The patient is not nervous/anxious and does not have insomnia.       PAST MEDICAL HISTORY :  Past Medical History:  Diagnosis Date  . Back pain 10/09/2012  . Bone cancer ([facility]   . CAD (coronary artery disease)    a. 08/2015 PCI: LM nl, LAD 20d, LCX nl, RCA 256mRPAV 95 (3.0x18 Xience Alpine DES);  b. [facility] (2.75x8 Promus Premier DES).  . Cancer associated pain   . Depression   . History of echocardiogram    a. 08/2016 Echo: EF 50-55%.  . Marland Kitchenistory of kidney stones   . Hyperlipidemia   . Hypertension   . Joint pain   . [facility]   a.  s/p prostatectomy ([facility]);  b. bone mets noted 04/2016.  . Right  arm pain 01/10/2016  . Right foot pain 01/10/2016  . Right leg pain 01/10/2016  . Tobacco abuse     PAST SURGICAL HISTORY :   Past Surgical History:  Procedure Laterality Date  . CARDIAC CATHETERIZATION     [facility]  . CARDIAC CATHETERIZATION  N/A 08/16/2015   Procedure: Left Heart Cath and Coronary Angiography;  Surgeon: Yolonda Kida, MD;  Location: [place] [facility];  Service: Cardiovascular;  Laterality: N/A;  . CARDIAC CATHETERIZATION N/A 08/16/2015   Procedure: Coronary Stent Intervention;  Surgeon: Yolonda Kida, MD;  Location: [place] [facility];  Service: Cardiovascular;  Laterality: N/A;  . CERVIX SURGERY    . PROSTATECTOMY    . SPINE SURGERY    . TONSILLECTOMY    . WRIST SURGERY      FAMILY HISTORY :   Family History  Problem Relation Age of Onset  . Heart attack Mother   . Hypertension Mother   . Heart attack Father     SOCIAL HISTORY:   Social History   Tobacco Use  . Smoking status: Current Every Day Smoker    Packs/day: 0.50    Years: 40.00    Pack years: 20.00    Types: Cigarettes  . Smokeless tobacco: Current User    Types: Chew  . Tobacco comment: 1 pack every couple days   Substance Use Topics  . Alcohol use: Yes    Frequency: Never    Comment: occasionally  . Drug use: No    ALLERGIES:  is allergic to ditropan [oxybutynin].  MEDICATIONS:  Current Outpatient Medications  Medication Sig Dispense Refill  . acetaminophen (TYLENOL) 500 MG tablet Take 1,000 mg by mouth every 8 (eight) hours as needed.     Marland Kitchen albuterol (PROVENTIL HFA;VENTOLIN HFA) 108 (90 Base) MCG/ACT inhaler Inhale 1-2 puffs into the lungs every 6 (six) hours as needed for wheezing or shortness of breath. 1 Inhaler 0  . aspirin 81 MG tablet Take 81 mg by mouth daily.    . chlorhexidine (PERIDEX) 0.12 % solution     . clopidogrel (PLAVIX) 75 MG tablet TAKE 1 TABLET BY MOUTH DAILY 90 tablet 0  . fentaNYL (DURAGESIC) 100 MCG/HR Place 1 patch onto the skin every other day. Along with 94mg patch for total of 125 mcg every 2 days. 15 patch 0  . fentaNYL (DURAGESIC) 25 MCG/HR Place 1 patch onto the skin every other day. Use this along with fentanyl patch 100 mcg [total 125 mcg] every 2 days 15 patch 0  . gabapentin  (NEURONTIN) 100 MG capsule START 1 CAPSULE DAILY, INCREASE BY 1 CAP EVERY 2 TO 3 DAYS AS TOLERATED UP TO 3 TIMES A DAY, OR MAY TAKE 3 AT ONCE IN EDiablock 90 capsule 5  . HYDROcodone-acetaminophen (NORCO) 10-325 MG tablet Take 1 tablet by mouth every 8 (eight) hours as needed. 90 tablet 0  . metoprolol tartrate (LOPRESSOR) 25 MG tablet TAKE 1/2 TABLET BY MOUTH 2 TIMES DAILY. 90 tablet 1  . mirtazapine (REMERON) 30 MG tablet Take 1 tablet (30 mg total) by mouth at bedtime. 30 tablet 1  . predniSONE (DELTASONE) 5 MG tablet Take 1 tablet (5 mg total) by mouth daily with breakfast. 30 tablet 1  . promethazine (PHENERGAN) 25 MG tablet     . Wheat Dextrin (BENEFIBER) POWD Stir 2 tsp. TID into 4-8 oz of any non-carbonated beverage or soft food (hot or cold) 500 g PRN  . XTANDI 40 MG capsule TAKE 3 CAPSULES (120  MG TOTAL) BY MOUTH DAILY. 84 capsule 3  . amoxicillin (AMOXIL) 500 MG capsule Take 1 capsule (500 mg total) by mouth 3 (three) times daily. 21 capsule 0  . atorvastatin (LIPITOR) 80 MG tablet TAKE 1 TABLET (80 MG TOTAL) BY MOUTH DAILY AT 6 PM. 90 tablet 1  . naloxone (NARCAN) nasal spray 4 mg/0.1 mL 1 spray into nostril upon signs of opioid overdose. Call 911. May repeat once if no response within 2-3 minutes. (Patient not taking: Reported on 05/16/2018) 1 kit 0  . nitroGLYCERIN (NITROSTAT) 0.4 MG SL tablet Place 0.4 mg under the tongue every 5 (five) minutes as needed.     . polyethylene glycol (MIRALAX / GLYCOLAX) packet Take 17 g by mouth daily as needed for moderate constipation.     No current facility-administered medications for this visit.     PHYSICAL EXAMINATION: ECOG PERFORMANCE STATUS: 1 - Symptomatic but completely ambulatory  BP (!) 164/95 (BP Location: Left Arm, Patient Position: Sitting)   Pulse 71   Temp (!) 97 F (36.1 C) (Tympanic)   Resp 20   Ht _0  (1.803 m)   Wt 170 lb 12.8 oz (77.5 kg)   BMI 23.82 kg/m   Filed Weights   07/12/18 1431  Weight: 170 lb 12.8 oz  (77.5 kg)    Physical Exam  Constitutional: He is oriented to person, place, and time.   Frail-appearing Caucasian male patient.  He is walking with a cane.    HENT:  Head: Normocephalic and atraumatic.  Mouth/Throat: Oropharynx is clear and moist. No oropharyngeal exudate.  Poor dentition.  Eyes: Pupils are equal, round, and reactive to light.  Neck: Normal range of motion. Neck supple.  Cardiovascular: Normal rate and regular rhythm.  Pulmonary/Chest: No respiratory distress. He has no wheezes.  Decreased breath sounds bilaterally at the bases.  No wheeze or crackles  Abdominal: Soft. Bowel sounds are normal. He exhibits no distension and no mass. There is no abdominal tenderness. There is no rebound and no guarding.  Musculoskeletal: Normal range of motion.        General: No tenderness or edema.  Neurological: He is alert and oriented to person, place, and time.  Skin: Skin is warm.  Left neck approximate 2 cm cyst noted fluctuant pus noted.  Mild tenderness.  Psychiatric: Affect normal.       LABORATORY DATA:  I have reviewed the data as listed    Component Value Date/Time   NA 139 07/12/2018 1415   NA 140 07/13/2013 1542   K 4.5 07/12/2018 1415   K 4.3 07/13/2013 1542   CL 105 07/12/2018 1415   CL 107 07/13/2013 1542   CO2 26 07/12/2018 1415   CO2 26 07/13/2013 1542   GLUCOSE 124 (H) 07/12/2018 1415   GLUCOSE 106 (H) 07/13/2013 1542   BUN 13 07/12/2018 1415   BUN 11 07/13/2013 1542   CREATININE 0.71 07/12/2018 1415   CREATININE 0.76 10/15/2017 1001   CALCIUM 9.4 07/12/2018 1415   CALCIUM 9.9 07/13/2013 1542   PROT 6.5 07/12/2018 1415   PROT 8.1 07/13/2013 1542   ALBUMIN 3.7 07/12/2018 1415   ALBUMIN 4.3 07/13/2013 1542   AST 17 07/12/2018 1415   AST 26 07/13/2013 1542   ALT 9 07/12/2018 1415   ALT 32 07/13/2013 1542   ALKPHOS 42 07/12/2018 1415   ALKPHOS 74 07/13/2013 1542   BILITOT 0.4 07/12/2018 1415   BILITOT 0.4 07/13/2013 1542   GFRNONAA >60  07/12/2018 1415  GFRNONAA 95 10/15/2017 1001   GFRAA >60 07/12/2018 1415   GFRAA 110 10/15/2017 1001    No results found for: SPEP, UPEP  Lab Results  Component Value Date   WBC 6.7 07/12/2018   NEUTROABS 5.3 07/12/2018   HGB 11.7 (L) 07/12/2018   HCT 36.1 (L) 07/12/2018   MCV 95.3 07/12/2018   PLT 174 07/12/2018      Chemistry      Component Value Date/Time   NA 139 07/12/2018 1415   NA 140 07/13/2013 1542   K 4.5 07/12/2018 1415   K 4.3 07/13/2013 1542   CL 105 07/12/2018 1415   CL 107 07/13/2013 1542   CO2 26 07/12/2018 1415   CO2 26 07/13/2013 1542   BUN 13 07/12/2018 1415   BUN 11 07/13/2013 1542   CREATININE 0.71 07/12/2018 1415   CREATININE 0.76 10/15/2017 1001      Component Value Date/Time   CALCIUM 9.4 07/12/2018 1415   CALCIUM 9.9 07/13/2013 1542   ALKPHOS 42 07/12/2018 1415   ALKPHOS 74 07/13/2013 1542   AST 17 07/12/2018 1415   AST 26 07/13/2013 1542   ALT 9 07/12/2018 1415   ALT 32 07/13/2013 1542   BILITOT 0.4 07/12/2018 1415   BILITOT 0.4 07/13/2013 1542       RADIOGRAPHIC STUDIES: I have personally reviewed the radiological images as listed and agreed with the findings in the report. No results found.   ASSESSMENT & PLAN:  Prostate cancer metastatic to bone ([place]) # Castrate resistant prostate cancer metastases to bone. Lupron every 3 months [06/07/2018].  PSA is rising- 64; he continues to admit noncompliance with Xtandi.  #Continue Xtandi 3 pills a day; await PSA from today; reminded the importance of compliance.  # Dental infection- s/p Clindamycin- improved.  Continue to hold off Xgeva.  # Depression/ anxiety-currently better no evidence of suicidal ideation.  Continue Remeron.  # Bone mets-hold Xgeva; last given on September 25, 2017. Hold off now sec to dental issues.  Stable  # infected sebaceous cyst- recommend Amoxicillin; and follow up with PCP   # Malignancy related pain- continue fentanyl patch hydrocodone;. Continue  fentanyl patch to every 48 hours and continue hydrocodone.  Stable  # DISPOSITION:  #Follow-up visit in 6 weeks-MD; labs-2-3 days prior-[ PSA/cbc/cmp]--Dr.B   No orders of the defined types were placed in this encounter.  All questions were answered. The patient knows to call the clinic with any problems, questions or concerns.      [name], MD 07/14/2018 6:04 PM

## 2018-07-15 MED FILL — XTANDI 40 MG CAPSULE: 40 | 28 days supply | Qty: 84 | Fill #1

## 2018-07-16 ENCOUNTER — Other Ambulatory Visit: Payer: Self-pay | Admitting: Cardiovascular Disease

## 2018-07-17 ENCOUNTER — Other Ambulatory Visit: Payer: Self-pay | Admitting: *Deleted

## 2018-07-17 MED ORDER — PREDNISONE 5 MG PO TABS: 5.0000 mg | ORAL_TABLET | Freq: Every day | ORAL | 1 refills | Status: DC

## 2018-08-06 ENCOUNTER — Other Ambulatory Visit: Payer: Self-pay | Admitting: Oncology

## 2018-08-06 DIAGNOSIS — C61 Malignant neoplasm of prostate: Secondary | ICD-10-CM

## 2018-08-06 DIAGNOSIS — G893 Neoplasm related pain (acute) (chronic): Secondary | ICD-10-CM

## 2018-08-06 MED ORDER — FENTANYL 25 MCG/HR TD PT72: 1.0000 | MEDICATED_PATCH | TRANSDERMAL | 0 refills | Status: DC

## 2018-08-06 MED ORDER — FENTANYL 100 MCG/HR TD PT72: 1.0000 | MEDICATED_PATCH | TRANSDERMAL | 0 refills | Status: DC

## 2018-08-06 MED ORDER — HYDROCODONE-ACETAMINOPHEN 10-325 MG PO TABS: 1.0000 | ORAL_TABLET | Freq: Three times a day (TID) | ORAL | 0 refills | Status: DC | PRN

## 2018-08-13 MED FILL — XTANDI 40 MG CAPSULE: 40 | 28 days supply | Qty: 84 | Fill #2

## 2018-08-20 ENCOUNTER — Other Ambulatory Visit: Payer: Self-pay | Admitting: *Deleted

## 2018-08-20 ENCOUNTER — Other Ambulatory Visit: Payer: Self-pay

## 2018-08-20 DIAGNOSIS — C7951 Secondary malignant neoplasm of bone: Secondary | ICD-10-CM

## 2018-08-20 DIAGNOSIS — C61 Malignant neoplasm of prostate: Secondary | ICD-10-CM

## 2018-08-21 ENCOUNTER — Inpatient Hospital Stay: Payer: 59 | Attending: Internal Medicine

## 2018-08-21 ENCOUNTER — Other Ambulatory Visit: Payer: Self-pay

## 2018-08-21 DIAGNOSIS — Z79899 Other long term (current) drug therapy: Secondary | ICD-10-CM | POA: Insufficient documentation

## 2018-08-21 DIAGNOSIS — K047 Periapical abscess without sinus: Secondary | ICD-10-CM | POA: Diagnosis not present

## 2018-08-21 DIAGNOSIS — Z7982 Long term (current) use of aspirin: Secondary | ICD-10-CM | POA: Insufficient documentation

## 2018-08-21 DIAGNOSIS — C7951 Secondary malignant neoplasm of bone: Secondary | ICD-10-CM | POA: Insufficient documentation

## 2018-08-21 DIAGNOSIS — C61 Malignant neoplasm of prostate: Secondary | ICD-10-CM | POA: Diagnosis not present

## 2018-08-21 DIAGNOSIS — E785 Hyperlipidemia, unspecified: Secondary | ICD-10-CM | POA: Diagnosis not present

## 2018-08-21 DIAGNOSIS — F17210 Nicotine dependence, cigarettes, uncomplicated: Secondary | ICD-10-CM | POA: Diagnosis not present

## 2018-08-21 DIAGNOSIS — F329 Major depressive disorder, single episode, unspecified: Secondary | ICD-10-CM | POA: Diagnosis not present

## 2018-08-21 DIAGNOSIS — I10 Essential (primary) hypertension: Secondary | ICD-10-CM | POA: Diagnosis not present

## 2018-08-21 LAB — CBC WITH DIFFERENTIAL/PLATELET
Abs Immature Granulocytes: 0.01 10*3/uL (ref 0.00–0.07)
Basophils Absolute: 0 10*3/uL (ref 0.0–0.1)
Basophils Relative: 0 %
Eosinophils Absolute: 0.1 10*3/uL (ref 0.0–0.5)
Eosinophils Relative: 2 %
HCT: 37.6 % — ABNORMAL LOW (ref 39.0–52.0)
Hemoglobin: 12.3 g/dL — ABNORMAL LOW (ref 13.0–17.0)
Immature Granulocytes: 0 %
Lymphocytes Relative: 23 %
Lymphs Abs: 1.4 10*3/uL (ref 0.7–4.0)
MCH: 31.3 pg (ref 26.0–34.0)
MCHC: 32.7 g/dL (ref 30.0–36.0)
MCV: 95.7 fL (ref 80.0–100.0)
Monocytes Absolute: 0.6 10*3/uL (ref 0.1–1.0)
Monocytes Relative: 9 %
Neutro Abs: 4 10*3/uL (ref 1.7–7.7)
Neutrophils Relative %: 66 %
Platelets: 174 10*3/uL (ref 150–400)
RBC: 3.93 MIL/uL — ABNORMAL LOW (ref 4.22–5.81)
RDW: 13.8 % (ref 11.5–15.5)
WBC: 6.1 10*3/uL (ref 4.0–10.5)
nRBC: 0 % (ref 0.0–0.2)

## 2018-08-21 LAB — COMPREHENSIVE METABOLIC PANEL
ALT: 12 U/L (ref 0–44)
AST: 17 U/L (ref 15–41)
Albumin: 4 g/dL (ref 3.5–5.0)
Alkaline Phosphatase: 52 U/L (ref 38–126)
Anion gap: 8 (ref 5–15)
BUN: 15 mg/dL (ref 8–23)
CO2: 29 mmol/L (ref 22–32)
Calcium: 10 mg/dL (ref 8.9–10.3)
Chloride: 103 mmol/L (ref 98–111)
Creatinine, Ser: 0.9 mg/dL (ref 0.61–1.24)
GFR calc Af Amer: >60 mL/min (ref >60)
GFR calc non Af Amer: >60 mL/min (ref >60)
Glucose, Bld: 102 mg/dL — ABNORMAL HIGH (ref 70–99)
Potassium: 4.1 mmol/L (ref 3.5–5.1)
Sodium: 140 mmol/L (ref 135–145)
Total Bilirubin: 0.6 mg/dL (ref 0.3–1.2)
Total Protein: 6.9 g/dL (ref 6.5–8.1)

## 2018-08-21 LAB — PSA: Prostatic Specific Antigen: 121.72 ng/mL — ABNORMAL HIGH (ref 0.00–4.00)

## 2018-08-23 ENCOUNTER — Other Ambulatory Visit: Payer: Self-pay

## 2018-08-23 ENCOUNTER — Telehealth: Payer: Self-pay | Admitting: Pharmacist

## 2018-08-23 ENCOUNTER — Encounter: Payer: Self-pay | Admitting: Internal Medicine

## 2018-08-23 ENCOUNTER — Inpatient Hospital Stay (HOSPITAL_BASED_OUTPATIENT_CLINIC_OR_DEPARTMENT_OTHER): Payer: 59 | Admitting: Internal Medicine

## 2018-08-23 VITALS — BP 154/82 | HR 70 | Temp 97.3°F | Resp 20 | Ht 71.0 in | Wt 161.2 lb

## 2018-08-23 DIAGNOSIS — Z79899 Other long term (current) drug therapy: Secondary | ICD-10-CM | POA: Diagnosis not present

## 2018-08-23 DIAGNOSIS — F329 Major depressive disorder, single episode, unspecified: Secondary | ICD-10-CM | POA: Diagnosis not present

## 2018-08-23 DIAGNOSIS — I10 Essential (primary) hypertension: Secondary | ICD-10-CM | POA: Diagnosis not present

## 2018-08-23 DIAGNOSIS — Z7982 Long term (current) use of aspirin: Secondary | ICD-10-CM | POA: Diagnosis not present

## 2018-08-23 DIAGNOSIS — C61 Malignant neoplasm of prostate: Secondary | ICD-10-CM | POA: Diagnosis not present

## 2018-08-23 DIAGNOSIS — C7951 Secondary malignant neoplasm of bone: Secondary | ICD-10-CM | POA: Diagnosis not present

## 2018-08-23 DIAGNOSIS — K047 Periapical abscess without sinus: Secondary | ICD-10-CM | POA: Diagnosis not present

## 2018-08-23 DIAGNOSIS — E785 Hyperlipidemia, unspecified: Secondary | ICD-10-CM | POA: Diagnosis not present

## 2018-08-23 DIAGNOSIS — F17210 Nicotine dependence, cigarettes, uncomplicated: Secondary | ICD-10-CM | POA: Diagnosis not present

## 2018-08-23 MED ORDER — APALUTAMIDE 60 MG PO TABS: 240.0000 mg | ORAL_TABLET | Freq: Every day | ORAL | 6 refills | Status: DC

## 2018-08-23 NOTE — Telephone Encounter (Signed)
Oral Oncology Pharmacist Encounter  Received new prescription for Erleada (apalutamide) for the treatment of castrate resistant prostate cancer metastases in conjunction with Lupron, planned duration until disease progression or unacceptable drug toxicity.  Prescription dose and frequency assessed.   Current medication list in Epic reviewed, several DDIs with apalutamide identified: - Apalutamide may decrease the serum concentration of atorvastatin, hydrocodone, fentanyl, mirtazapine, and prednisone. Monitor patient for decreased effectiveness of the listed medications. No baseline dose adjustment needed.  -Clopidogrel: apalutamide may increase the concentration of clopidogrel active metabolites. Monitor for increased clopidogrel effects and toxicities (eg, bleeding).  Prescription has been e-scribed to the [facility] for benefits analysis and approval.  Oral Oncology Clinic will continue to follow for insurance authorization, copayment issues, initial counseling and start date.  Darl Pikes, PharmD, BCPS, [facility] Hematology/Oncology Clinical Pharmacist [facility]/HP/AP [facility] [place] [facility] [phone]  08/23/2018 4:25 PM

## 2018-08-23 NOTE — Progress Notes (Signed)
[place] OFFICE PROGRESS NOTE  Patient Care Team: Olin Hauser, [name], [name], [name]  Cancer Staging No matching staging information was found for the patient.   Oncology History Overview Note  # 2011- PROSTATE CANCER [Gleason 3+4]; s/p Prostatectomy [ also involved bladder neck/ECP; Dr.Polaseck]; July 2014- Biochemical recurrence [PSA 14]- started on Zoladex [Dr.Pandit]; lost to follow up.  # JAN 2017- STAGE IV METASTATIC PROSTATE Cancer to Bone- Feb 13th, 2017-  Lupron q 41m[~end of feb]; PSA: 1021; Declined Chemo; April 2017 [xofigo x6; Dr.Crystal]; AUG 2017- Zytiga + Prednisone BID. Bone scan-Jan 2018- improved skeletal metastases.   # MAY 1st 2019- START X-tandi [stopped zytiga/PSA- 8.8/rising]  #August 23, 2018-stop Xtandi [intolerance-fatigue mental fogginess]  # Mets to bone- start X-geva q 47M [May 30th]  # Smoker/ chronic pain/pain clinic   #[April 2018; [place]]- s/p stenting; on Plavix; --------------------------------------------------------------  DIAGNOSIS: '[ ]'  Castrate resistant prostate cancer  STAGE:   4    ;GOALS: Palliative  CURRENT/MOST RECENT THERAPY [April 2019]-Xtandi    Prostate cancer metastatic to bone ([facility]  12/08/2014 Initial Diagnosis   Prostate cancer metastatic to bone ([facility]    INTERVAL HISTORY:  Robert LamarCox 68y.o.  male pleasant patient above history of metastatic castrate resistant prostate cancer currently on Xtandi is here for follow-up.  Patient states that he has been taking Xtandi 3 pills a day " most of the days".  He complains of extreme fatigue; complains of mental fogginess.  Has been sleeping most of the time.  Patient states his joint pains back pain are fairly stable on current pain regimen.   He continues to be a lot of stress because social  issues-however denies any suicidal ideation.  No fever chills.  Denies any jaw swelling.  Review of Systems  Constitutional: Positive for malaise/fatigue and weight loss. Negative for chills, diaphoresis and fever.  HENT: Negative for nosebleeds and sore throat.   Eyes: Negative for double vision.  Respiratory: Positive for shortness of breath. Negative for cough, hemoptysis, sputum production and wheezing.   Cardiovascular: Negative for chest pain, palpitations, orthopnea and leg swelling.  Gastrointestinal: Positive for constipation. Negative for abdominal pain, blood in stool, diarrhea, heartburn, melena, nausea and vomiting.  Genitourinary: Negative for dysuria, frequency and urgency.  Musculoskeletal: Positive for back pain and joint pain.  Skin: Negative.  Negative for itching and rash.  Neurological: Negative for dizziness, tingling, focal weakness, weakness and headaches.  Endo/Heme/Allergies: Does not bruise/bleed easily.  Psychiatric/Behavioral: Negative for depression. The patient is not nervous/anxious and does not have insomnia.       PAST MEDICAL HISTORY :  Past Medical History:  Diagnosis Date  . Back pain 10/09/2012  . Bone cancer ([facility]   . CAD (coronary artery disease)    a. 08/2015 PCI: LM nl, LAD 20d, LCX nl, RCA 211mRPAV 95 (3.0x18 Xience [place] DES);  b[facility]2.75x8 Promus Premier DES).  . Cancer associated pain   . Depression   . History of echocardiogram    a. 08/2016 Echo: EF 50-55%.  . Marland Kitchenistory of kidney stones   . Hyperlipidemia   . Hypertension   . Joint pain   . [facility]   a.  s/p prostatectomy ([facility]);  b. bone mets noted 04/2016.  . Right arm pain 01/10/2016  . Right foot  pain 01/10/2016  . Right leg pain 01/10/2016  . Tobacco abuse     PAST SURGICAL HISTORY :   Past Surgical History:  Procedure Laterality Date  . CARDIAC CATHETERIZATION     [facility]  . CARDIAC CATHETERIZATION N/A 08/16/2015   Procedure: Left  Heart Cath and Coronary Angiography;  Surgeon: Yolonda Kida, MD;  Location: [place] CV [facility];  Service: Cardiovascular;  Laterality: N/A;  . CARDIAC CATHETERIZATION N/A 08/16/2015   Procedure: Coronary Stent Intervention;  Surgeon: Yolonda Kida, MD;  Location: [place] CV [facility];  Service: Cardiovascular;  Laterality: N/A;  . CERVIX SURGERY    . PROSTATECTOMY    . SPINE SURGERY    . TONSILLECTOMY    . WRIST SURGERY      FAMILY HISTORY :   Family History  Problem Relation Age of Onset  . Heart attack Mother   . Hypertension Mother   . Heart attack Father     SOCIAL HISTORY:   Social History   Tobacco Use  . Smoking status: Current Every Day Smoker    Packs/day: 0.50    Years: 40.00    Pack years: 20.00    Types: Cigarettes  . Smokeless tobacco: Current User    Types: Chew  . Tobacco comment: 1 pack every couple days   Substance Use Topics  . Alcohol use: Yes    Frequency: Never    Comment: occasionally  . Drug use: No    ALLERGIES:  is allergic to ditropan [oxybutynin].  MEDICATIONS:  Current Outpatient Medications  Medication Sig Dispense Refill  . acetaminophen (TYLENOL) 500 MG tablet Take 1,000 mg by mouth every 8 (eight) hours as needed.     Marland Kitchen albuterol (PROVENTIL HFA;VENTOLIN HFA) 108 (90 Base) MCG/ACT inhaler Inhale 1-2 puffs into the lungs every 6 (six) hours as needed for wheezing or shortness of breath. 1 Inhaler 0  . aspirin 81 MG tablet Take 81 mg by mouth daily.    Marland Kitchen atorvastatin (LIPITOR) 80 MG tablet TAKE 1 TABLET (80 MG TOTAL) BY MOUTH DAILY AT 6 PM. 90 tablet 1  . chlorhexidine (PERIDEX) 0.12 % solution     . clopidogrel (PLAVIX) 75 MG tablet TAKE 1 TABLET BY MOUTH DAILY 90 tablet 0  . fentaNYL (DURAGESIC) 100 MCG/HR Place 1 patch onto the skin every other day. Along with 34mg patch for total of 125 mcg every 2 days. 15 patch 0  . fentaNYL (DURAGESIC) 25 MCG/HR Place 1 patch onto the skin every other day. Use this along with fentanyl  patch 100 mcg [total 125 mcg] every 2 days 15 patch 0  . gabapentin (NEURONTIN) 100 MG capsule START 1 CAPSULE DAILY, INCREASE BY 1 CAP EVERY 2 TO 3 DAYS AS TOLERATED UP TO 3 TIMES A DAY, OR MAY TAKE 3 AT ONCE IN [place] 90 capsule 5  . HYDROcodone-acetaminophen (NORCO) 10-325 MG tablet Take 1 tablet by mouth every 8 (eight) hours as needed. 90 tablet 0  . metoprolol tartrate (LOPRESSOR) 25 MG tablet TAKE 1/2 TABLET BY MOUTH 2 TIMES DAILY. 90 tablet 1  . mirtazapine (REMERON) 30 MG tablet Take 1 tablet (30 mg total) by mouth at bedtime. 30 tablet 1  . nitroGLYCERIN (NITROSTAT) 0.4 MG SL tablet Place 0.4 mg under the tongue every 5 (five) minutes as needed.     . polyethylene glycol (MIRALAX / GLYCOLAX) packet Take 17 g by mouth daily as needed for moderate constipation.    . predniSONE (DELTASONE) 5 MG tablet Take  1 tablet (5 mg total) by mouth daily with breakfast. 30 tablet 1  . promethazine (PHENERGAN) 25 MG tablet     . Wheat Dextrin (BENEFIBER) POWD Stir 2 tsp. TID into 4-8 oz of any non-carbonated beverage or soft food (hot or cold) 500 g PRN  . apalutamide (ERLEADA) 60 MG tablet Take 4 tablets (240 mg total) by mouth daily. 120 tablet 6  . naloxone (NARCAN) nasal spray 4 mg/0.1 mL 1 spray into nostril upon signs of opioid overdose. Call 911. May repeat once if no response within 2-3 minutes. (Patient not taking: Reported on 05/16/2018) 1 kit 0   No current facility-administered medications for this visit.     PHYSICAL EXAMINATION: ECOG PERFORMANCE STATUS: 1 - Symptomatic but completely ambulatory  BP (!) 154/82 (BP Location: Left Arm, Patient Position: Sitting, Cuff Size: Normal)   Pulse 70   Temp (!) 97.3 F (36.3 C) (Tympanic)   Resp 20   Ht '5\' 11"'  (1.803 m)   Wt 161 lb 3.2 oz (73.1 kg)   BMI 22.48 kg/m   Filed Weights   08/23/18 1445  Weight: 161 lb 3.2 oz (73.1 kg)    Physical Exam  Constitutional: He is oriented to person, place, and time.   Frail-appearing Caucasian  male patient.  He is walking with a cane.    HENT:  Head: Normocephalic and atraumatic.  Mouth/Throat: Oropharynx is clear and moist. No oropharyngeal exudate.  Poor dentition.  Eyes: Pupils are equal, round, and reactive to light.  Neck: Normal range of motion. Neck supple.  Cardiovascular: Normal rate and regular rhythm.  Pulmonary/Chest: No respiratory distress. He has no wheezes.  Decreased breath sounds bilaterally at the bases.  No wheeze or crackles  Abdominal: Soft. Bowel sounds are normal. He exhibits no distension and no mass. There is no abdominal tenderness. There is no rebound and no guarding.  Musculoskeletal: Normal range of motion.        General: No tenderness or edema.  Neurological: He is alert and oriented to person, place, and time.  Skin: Skin is warm.  Psychiatric: Affect normal.       LABORATORY DATA:  I have reviewed the data as listed    Component Value Date/Time   NA 140 08/21/2018 1328   NA 140 07/13/2013 1542   K 4.1 08/21/2018 1328   K 4.3 07/13/2013 1542   CL 103 08/21/2018 1328   CL 107 07/13/2013 1542   CO2 29 08/21/2018 1328   CO2 26 07/13/2013 1542   GLUCOSE 102 (H) 08/21/2018 1328   GLUCOSE 106 (H) 07/13/2013 1542   BUN 15 08/21/2018 1328   BUN 11 07/13/2013 1542   CREATININE 0.90 08/21/2018 1328   CREATININE 0.76 10/15/2017 1001   CALCIUM 10.0 08/21/2018 1328   CALCIUM 9.9 07/13/2013 1542   PROT 6.9 08/21/2018 1328   PROT 8.1 07/13/2013 1542   ALBUMIN 4.0 08/21/2018 1328   ALBUMIN 4.3 07/13/2013 1542   AST 17 08/21/2018 1328   AST 26 07/13/2013 1542   ALT 12 08/21/2018 1328   ALT 32 07/13/2013 1542   ALKPHOS 52 08/21/2018 1328   ALKPHOS 74 07/13/2013 1542   BILITOT 0.6 08/21/2018 1328   BILITOT 0.4 07/13/2013 1542   GFRNONAA >60 08/21/2018 1328   GFRNONAA 95 10/15/2017 1001   GFRAA >60 08/21/2018 1328   GFRAA 110 10/15/2017 1001    No results found for: SPEP, UPEP  Lab Results  Component Value Date   WBC 6.1  08/21/2018  NEUTROABS 4.0 08/21/2018   HGB 12.3 (L) 08/21/2018   HCT 37.6 (L) 08/21/2018   MCV 95.7 08/21/2018   PLT 174 08/21/2018      Chemistry      Component Value Date/Time   NA 140 08/21/2018 1328   NA 140 07/13/2013 1542   K 4.1 08/21/2018 1328   K 4.3 07/13/2013 1542   CL 103 08/21/2018 1328   CL 107 07/13/2013 1542   CO2 29 08/21/2018 1328   CO2 26 07/13/2013 1542   BUN 15 08/21/2018 1328   BUN 11 07/13/2013 1542   CREATININE 0.90 08/21/2018 1328   CREATININE 0.76 10/15/2017 1001      Component Value Date/Time   CALCIUM 10.0 08/21/2018 1328   CALCIUM 9.9 07/13/2013 1542   ALKPHOS 52 08/21/2018 1328   ALKPHOS 74 07/13/2013 1542   AST 17 08/21/2018 1328   AST 26 07/13/2013 1542   ALT 12 08/21/2018 1328   ALT 32 07/13/2013 1542   BILITOT 0.6 08/21/2018 1328   BILITOT 0.4 07/13/2013 1542       RADIOGRAPHIC STUDIES: I have personally reviewed the radiological images as listed and agreed with the findings in the report. No results found.   ASSESSMENT & PLAN:  Prostate cancer metastatic to bone ([place]) # Castrate resistant prostate cancer metastases to bone. Lupron every 3 months [06/07/2018].  PSA is rising-121; patient stating that he is taking 3 pills of Xtandi a day; question compliance-because of side effects [see discussion below].  #Discontinue Xtandi because of continued side effects-extreme fatigue/mental fogginess.  Discussed option of chemotherapy-patient continues to decline.  Discussed alternative antihormone therapy-apalutamide because of intolerance to [place].  Patient interested.  Discussed with Ebony Hail.  # Dental infection- s/p Clindamycin-improved.  Discontinue Xgeva.  # Depression/ anxiety-stable continue Remeron.  # Bone mets-hold Xgeva; last given on September 25, 2017. Hold off now sec to dental issues.  STABLE.   # Malignancy related pain- continue fentanyl patch hydrocodone;. Continue fentanyl patch to every 48 hours and continue  hydrocodone.  Stable  # DISPOSITION:  #Follow-up visit in 6 weeks-MD/Lupron; labs-2-3 days prior-[ PSA/cbc/cmp- [facility]]; --Dr.B   Orders Placed This Encounter  Procedures  . CBC with Differential/Platelet    Standing Status:   Future    Standing Expiration Date:   08/23/2019  . Comprehensive metabolic panel    Standing Status:   Future    Standing Expiration Date:   08/23/2019  . PSA    Standing Status:   Future    Standing Expiration Date:   08/23/2019   All questions were answered. The patient knows to call the clinic with any problems, questions or concerns.      [name], MD 08/25/2018 11:10 AM

## 2018-08-23 NOTE — Patient Instructions (Signed)
#   STOP X-TANDI.

## 2018-08-23 NOTE — Assessment & Plan Note (Addendum)
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 Lupron every 3 months [06/07/2018].  PSA is rising-121; patient stating that he is taking 3 pills of Xtandi a day; question compliance-because of side effects [see discussion below].  #Discontinue Xtandi because of continued side effects-extreme fatigue/mental fogginess.  Discussed option of chemotherapy-patient continues to decline.  Discussed alternative antihormone therapy-apalutamide because of intolerance to [place].  Patient interested.  Discussed with Ebony Hail.  # Dental infection- s/p Clindamycin-improved.  Discontinue Xgeva.  # Depression/ anxiety-stable continue Remeron.  # Bone mets-hold Xgeva; last given on September 25, 2017. Hold off now sec to dental issues.  STABLE.   # Malignancy related pain- continue fentanyl patch hydrocodone;. Continue fentanyl patch to every 48 hours and continue hydrocodone.  Stable  # DISPOSITION:  #Follow-up visit in 6 weeks-MD/Lupron; labs-2-3 days prior-[ PSA/cbc/cmp- [facility]]; --Dr.B

## 2018-08-26 ENCOUNTER — Telehealth: Payer: Self-pay | Admitting: Pharmacist

## 2018-08-26 MED ORDER — APALUTAMIDE 60 MG PO TABS: 240.0000 mg | ORAL_TABLET | Freq: Every day | ORAL | 6 refills | Status: DC

## 2018-08-26 NOTE — Telephone Encounter (Signed)
Oral Oncology Pharmacist Encounter   Received notification from [place] that prior authorization for Erleada is required.   PA submitted on [facility] [place] [id number] Status is pending   Oral Oncology Clinic will continue to follow.   Darl Pikes, PharmD, BCPS, [facility] Hematology/Oncology Clinical Pharmacist [facility] [place] [facility] [phone]  08/26/2018 1:42 PM

## 2018-08-28 ENCOUNTER — Other Ambulatory Visit: Payer: Self-pay | Admitting: Cardiovascular Disease

## 2018-09-03 NOTE — Telephone Encounter (Signed)
Oral Oncology Patient Advocate Encounter  Prior Authorization for Robert Short has been approved.    PA# [id number] Effective dates: 08/30/2018 through 08/29/2019  Patients co-pay is $250.  Will obtain copay card to reduce copay.  Oral Oncology Clinic will continue to follow.   [place] Patient [place] Phone [phone] Fax [phone] 09/03/2018 2:50 PM

## 2018-09-04 ENCOUNTER — Other Ambulatory Visit: Payer: Self-pay | Admitting: Internal Medicine

## 2018-09-04 DIAGNOSIS — C61 Malignant neoplasm of prostate: Secondary | ICD-10-CM

## 2018-09-04 DIAGNOSIS — G893 Neoplasm related pain (acute) (chronic): Secondary | ICD-10-CM

## 2018-09-04 MED ORDER — FENTANYL 100 MCG/HR TD PT72: 1.0000 | MEDICATED_PATCH | TRANSDERMAL | 0 refills | Status: DC

## 2018-09-04 MED ORDER — FENTANYL 25 MCG/HR TD PT72: 1.0000 | MEDICATED_PATCH | TRANSDERMAL | 0 refills | Status: DC

## 2018-09-04 MED ORDER — HYDROCODONE-ACETAMINOPHEN 10-325 MG PO TABS: 1.0000 | ORAL_TABLET | Freq: Three times a day (TID) | ORAL | 0 refills | Status: DC | PRN

## 2018-09-04 NOTE — Telephone Encounter (Signed)
Patient called cancer center requesting refill of fentanyl 125 mcg/h every 72 hours and Norco 10/325 mg tablets every 8 hours as needed for pain.  As mandated by the [place] STOP Act (Strengthen Opioid Misuse Prevention), the [place] Controlled Substance Reporting System ([place]) was reviewed for this patient. Below is the past 46-months of controlled substance prescriptions as displayed by the registry. I have personally consulted with my supervising physician, Dr. Rogue Bussing, who agrees that continuation of opiate therapy is medically appropriate at this time and agrees to provide continual monitoring, including urine/blood drug screens, as indicated. Refill is appropriate on or after 09/04/2018.  [place]CC[place] reviewed: Reviewed PDMP and appropriate for refill.    Faythe Casa, NP 09/04/2018 10:50 AM [phone]

## 2018-09-05 ENCOUNTER — Other Ambulatory Visit: Payer: Self-pay | Admitting: Oncology

## 2018-09-05 ENCOUNTER — Encounter: Payer: Self-pay | Admitting: Internal Medicine

## 2018-09-05 ENCOUNTER — Telehealth: Payer: Self-pay | Admitting: Pharmacy Technician

## 2018-09-05 ENCOUNTER — Telehealth: Payer: Self-pay | Admitting: *Deleted

## 2018-09-05 DIAGNOSIS — C61 Malignant neoplasm of prostate: Secondary | ICD-10-CM

## 2018-09-05 DIAGNOSIS — G893 Neoplasm related pain (acute) (chronic): Secondary | ICD-10-CM

## 2018-09-05 NOTE — Telephone Encounter (Signed)
recvd my chart msg requesting a RF on fentanyl. Scripts were sent on 9/2 to pt's pharmacy. However pt's fentanyl patches need PA done.  PA initiated.

## 2018-09-05 NOTE — Telephone Encounter (Signed)
Oral Oncology Patient Advocate Encounter   Was successful in obtaining a copay card for Erleada.  This copay card will make the patients copay $0.00.  I have spoken with the patient.    The billing information is as follows and has been shared with [facility].   RxBin: [id number] Member ID: [id number] Group ID: [id number]   Robert Short Patient [place] Phone [phone] Fax [phone] 09/05/2018 11:46 AM

## 2018-09-06 ENCOUNTER — Telehealth: Payer: Self-pay | Admitting: Pharmacist

## 2018-09-06 NOTE — Telephone Encounter (Signed)
Oral Chemotherapy Pharmacist Encounter  Medication to be delivered on 09/11/2018. Dr. Rogue Bussing would like for him to getting started when he receives the medication.  Patient Education I spoke with patient for overview of new oral chemotherapy medication: Erleada (apalutamide) for the treatment of castrate resistant prostate cancer metastases in conjunction with Lupron, planned duration until disease progression or unacceptable drug toxicity.  Counseled patient on administration, dosing, side effects, monitoring, drug-food interactions, safe handling, storage, and disposal. Patient will take 4 tablets (240 mg total) by mouth daily.  Side effects include but not limited to: fatigue, decreased wbc/hgb.    Reviewed with patient importance of keeping a medication schedule and plan for any missed doses.  Robert Short voiced understanding and appreciation. All questions answered. Medication handout placed in the mail.  Provided patient with [facility] phone number. Patient knows to call the office with questions or concerns. Oral Chemotherapy Navigation Clinic will continue to follow.  Darl Pikes, PharmD, BCPS, [facility] Hematology/Oncology Clinical Pharmacist [facility] [place] [facility] [phone]  09/06/2018 4:34 PM

## 2018-09-10 MED FILL — ERLEADA 60 MG TABS: 60 | 30 days supply | Qty: 120 | Fill #0

## 2018-10-02 ENCOUNTER — Other Ambulatory Visit: Payer: Self-pay | Admitting: Internal Medicine

## 2018-10-02 ENCOUNTER — Other Ambulatory Visit: Payer: 59

## 2018-10-02 ENCOUNTER — Other Ambulatory Visit: Payer: Self-pay | Admitting: Oncology

## 2018-10-02 DIAGNOSIS — G893 Neoplasm related pain (acute) (chronic): Secondary | ICD-10-CM

## 2018-10-02 DIAGNOSIS — C61 Malignant neoplasm of prostate: Secondary | ICD-10-CM

## 2018-10-03 ENCOUNTER — Other Ambulatory Visit: Payer: Self-pay

## 2018-10-03 ENCOUNTER — Inpatient Hospital Stay: Payer: 59 | Attending: Internal Medicine

## 2018-10-03 DIAGNOSIS — C7951 Secondary malignant neoplasm of bone: Secondary | ICD-10-CM | POA: Insufficient documentation

## 2018-10-03 DIAGNOSIS — C61 Malignant neoplasm of prostate: Secondary | ICD-10-CM | POA: Diagnosis not present

## 2018-10-03 LAB — CBC WITH DIFFERENTIAL/PLATELET
Abs Immature Granulocytes: 0.03 10*3/uL (ref 0.00–0.07)
Basophils Absolute: 0 10*3/uL (ref 0.0–0.1)
Basophils Relative: 1 %
Eosinophils Absolute: 0.2 10*3/uL (ref 0.0–0.5)
Eosinophils Relative: 4 %
HCT: 37.2 % — ABNORMAL LOW (ref 39.0–52.0)
Hemoglobin: 12.1 g/dL — ABNORMAL LOW (ref 13.0–17.0)
Immature Granulocytes: 1 %
Lymphocytes Relative: 31 %
Lymphs Abs: 1.7 10*3/uL (ref 0.7–4.0)
MCH: 31.3 pg (ref 26.0–34.0)
MCHC: 32.5 g/dL (ref 30.0–36.0)
MCV: 96.4 fL (ref 80.0–100.0)
Monocytes Absolute: 0.6 10*3/uL (ref 0.1–1.0)
Monocytes Relative: 10 %
Neutro Abs: 2.9 10*3/uL (ref 1.7–7.7)
Neutrophils Relative %: 53 %
Platelets: 181 10*3/uL (ref 150–400)
RBC: 3.86 MIL/uL — ABNORMAL LOW (ref 4.22–5.81)
RDW: 13.7 % (ref 11.5–15.5)
WBC: 5.5 10*3/uL (ref 4.0–10.5)
nRBC: 0 % (ref 0.0–0.2)

## 2018-10-03 LAB — COMPREHENSIVE METABOLIC PANEL
ALT: 10 U/L (ref 0–44)
AST: 16 U/L (ref 15–41)
Albumin: 3.7 g/dL (ref 3.5–5.0)
Alkaline Phosphatase: 42 U/L (ref 38–126)
Anion gap: 7 (ref 5–15)
BUN: 16 mg/dL (ref 8–23)
CO2: 28 mmol/L (ref 22–32)
Calcium: 9.6 mg/dL (ref 8.9–10.3)
Chloride: 100 mmol/L (ref 98–111)
Creatinine, Ser: 0.8 mg/dL (ref 0.61–1.24)
GFR calc Af Amer: >60 mL/min (ref >60)
GFR calc non Af Amer: >60 mL/min (ref >60)
Glucose, Bld: 157 mg/dL — ABNORMAL HIGH (ref 70–99)
Potassium: 3.5 mmol/L (ref 3.5–5.1)
Sodium: 135 mmol/L (ref 135–145)
Total Bilirubin: 0.3 mg/dL (ref 0.3–1.2)
Total Protein: 6.3 g/dL — ABNORMAL LOW (ref 6.5–8.1)

## 2018-10-03 MED ORDER — FENTANYL 100 MCG/HR TD PT72: 1.0000 | MEDICATED_PATCH | TRANSDERMAL | 0 refills | Status: DC

## 2018-10-03 MED ORDER — HYDROCODONE-ACETAMINOPHEN 10-325 MG PO TABS: 1.0000 | ORAL_TABLET | Freq: Three times a day (TID) | ORAL | 0 refills | Status: DC | PRN

## 2018-10-03 MED ORDER — FENTANYL 25 MCG/HR TD PT72: 1.0000 | MEDICATED_PATCH | TRANSDERMAL | 0 refills | Status: DC

## 2018-10-03 NOTE — Progress Notes (Signed)
Patient is coming in for follow up he mentions he has no appetite. No other questions or concerns

## 2018-10-03 NOTE — Telephone Encounter (Signed)
Patient called [facility] requesting refill of fentanyl and norco.   As mandated by the [place] STOP Act (Strengthen Opioid Misuse Prevention), the [place] Controlled Substance Reporting System ([employer]) was reviewed for this patient.  Per medical oncology,  continuation of opiate therapy is medically appropriate at this time and agrees to provide continual monitoring, including urine/blood drug screens, as indicated. Prescription sent electronically using [employer] secure transmission to requested pharmacy.   [place] Reviewed & PDMP updated in Epic.   Beckey Rutter, DNP, AGNP-C [place] at [facility] [phone] (work cell) [phone] (office)

## 2018-10-04 ENCOUNTER — Inpatient Hospital Stay: Payer: 59 | Attending: Internal Medicine | Admitting: Internal Medicine

## 2018-10-04 ENCOUNTER — Inpatient Hospital Stay: Payer: 59

## 2018-10-04 ENCOUNTER — Other Ambulatory Visit: Payer: Self-pay

## 2018-10-04 DIAGNOSIS — G629 Polyneuropathy, unspecified: Secondary | ICD-10-CM | POA: Diagnosis not present

## 2018-10-04 DIAGNOSIS — C7951 Secondary malignant neoplasm of bone: Secondary | ICD-10-CM | POA: Insufficient documentation

## 2018-10-04 DIAGNOSIS — Z5111 Encounter for antineoplastic chemotherapy: Secondary | ICD-10-CM | POA: Diagnosis not present

## 2018-10-04 DIAGNOSIS — F17210 Nicotine dependence, cigarettes, uncomplicated: Secondary | ICD-10-CM | POA: Diagnosis not present

## 2018-10-04 DIAGNOSIS — T451X5A Adverse effect of antineoplastic and immunosuppressive drugs, initial encounter: Secondary | ICD-10-CM | POA: Diagnosis not present

## 2018-10-04 DIAGNOSIS — C61 Malignant neoplasm of prostate: Secondary | ICD-10-CM | POA: Diagnosis not present

## 2018-10-04 DIAGNOSIS — G893 Neoplasm related pain (acute) (chronic): Secondary | ICD-10-CM | POA: Diagnosis not present

## 2018-10-04 DIAGNOSIS — Z79899 Other long term (current) drug therapy: Secondary | ICD-10-CM | POA: Diagnosis not present

## 2018-10-04 LAB — PSA: Prostatic Specific Antigen: 188 ng/mL — ABNORMAL HIGH (ref 0.00–4.00)

## 2018-10-04 MED ORDER — FENTANYL 100 MCG/HR TD PT72: 1.0000 | MEDICATED_PATCH | TRANSDERMAL | 0 refills | Status: DC

## 2018-10-04 MED ORDER — FENTANYL 25 MCG/HR TD PT72: 1.0000 | MEDICATED_PATCH | TRANSDERMAL | 0 refills | Status: DC

## 2018-10-04 MED ORDER — LEUPROLIDE ACETATE (3 MONTH) 22.5 MG IM KIT
22.5000 mg | PACK | Freq: Once | INTRAMUSCULAR | Status: AC
  Administered 2018-10-04: 22.5 mg via INTRAMUSCULAR
  Filled 2018-10-04: qty 22.5

## 2018-10-04 MED FILL — ERLEADA 60 MG TABS: 60 | 30 days supply | Qty: 120 | Fill #1

## 2018-10-04 NOTE — Assessment & Plan Note (Addendum)
Worsening# Castrate resistant prostate cancer metastases to bone. Lupron every 3 months [06/07/2018]. On Apalutamide 240 mg/day-? Sep 2020; PSA- 190; rising.   #Clinically-worsening.  Discussed that given the progressive disease noted on anti-hormonal therapy I would recommend systemic chemotherapy with docetaxel.  Discussed chemotherapy would be offered.  3 weeks x 6 cycles.  However patient still reluctant with chemotherapeutic options.  He states that he might consider chemotherapy based upon the results of the PET scan.  PET scan ordered.  #Increased frequency of urination-declines urology evaluation.  # Depression/ anxiety-stable continue Remeron.  # Malignancy related pain- continue fentanyl patch hydrocodone;. Continue fentanyl patch to every 48 hours and continue hydrocodone.  Stable  # DISPOSITION:  # lupron today.  #Follow-up visit in 2 weeks-MD;no labs;  PET prior  # josh in 2 weeks--Dr.B

## 2018-10-04 NOTE — Progress Notes (Signed)
[place] OFFICE PROGRESS NOTE  Patient Care Team: Robert Hauser, DO as PCP - General (Family Medicine) Robert Short Robert November, [name], [name]  Cancer Staging No matching staging information was found for the patient.   Oncology History Overview Note  # 2011- PROSTATE CANCER [Gleason 3+4]; s/p Prostatectomy [ also involved bladder neck/ECP; Dr.Polaseck]; July 2014- Biochemical recurrence [PSA 14]- started on Zoladex [Dr.Pandit]; lost to follow up.  # JAN 2017- STAGE IV METASTATIC PROSTATE Cancer to Bone- Feb 13th, 2017-  Lupron q 78m[~end of feb]; PSA: 1021; Declined Chemo; April 2017 [xofigo x6; Dr.Crystal]; AUG 2017- Zytiga + Prednisone BID. Bone scan-Jan 2018- improved skeletal metastases.   # MAY 1st 2019- START X-tandi [stopped zytiga/PSA- 8.8/rising]  #August 23, 2018-stop Xtandi [intolerance-fatigue mental fogginess]  #Mid September 2020-apalutamide  # Mets to bone- start X-geva q 41M [May 30th]; hold dental issues/infections  # Smoker/ chronic pain/pain clinic   #[April 2018; [place]]- s/p stenting; on Plavix; --------------------------------------------------------------  DIAGNOSIS: '[ ]'  Castrate resistant prostate cancer  STAGE:   4    ;GOALS: Palliative  CURRENT/MOST RECENT THERAPY [April 2019]-Xtandi    Prostate cancer metastatic to bone (Robert Short  12/08/2014 Initial Diagnosis   Prostate cancer metastatic to bone ([facility]    INTERVAL HISTORY:  Robert Short 68y.o.  male pleasant patient above history of metastatic castrate resistant prostate cancer currently apalutamide for the last 3 weeks or so is here for follow-up.  Patient stated he has been taking 4 pills.  Denies any diarrhea.  Denies any skin rash.  But does complain of continue worsening joint pains bone pain.  Continues to be on fatigue.  He denies any significant improvement of his mental  fogginess.  Denies any jaw swelling or jaw pain.  Review of Systems  Constitutional: Positive for malaise/fatigue and weight loss. Negative for chills, diaphoresis and fever.  HENT: Negative for nosebleeds and sore throat.   Eyes: Negative for double vision.  Respiratory: Positive for shortness of breath. Negative for cough, hemoptysis, sputum production and wheezing.   Cardiovascular: Negative for chest pain, palpitations, orthopnea and leg swelling.  Gastrointestinal: Positive for constipation. Negative for abdominal pain, blood in stool, diarrhea, heartburn, melena, nausea and vomiting.  Genitourinary: Negative for dysuria, frequency and urgency.  Musculoskeletal: Positive for back pain and joint pain.  Skin: Negative.  Negative for itching and rash.  Neurological: Negative for dizziness, tingling, focal weakness, weakness and headaches.  Endo/Heme/Allergies: Does not bruise/bleed easily.  Psychiatric/Behavioral: Negative for depression. The patient is not nervous/anxious and does not have insomnia.       PAST MEDICAL HISTORY :  Past Medical History:  Diagnosis Date  . Back pain 10/09/2012  . Bone cancer (Robert Short   . CAD (coronary artery disease)    a. 08/2015 PCI: LM nl, LAD 20d, LCX nl, RCA 273mRPAV 95 (3.0x18 Xience Alpine DES);  b. [facility] 95 (2.75x8 Promus Premier DES).  . Cancer associated pain   . Depression   . History of echocardiogram    a. 08/2016 Echo: EF 50-55%.  . Marland Kitchenistory of kidney stones   . Hyperlipidemia   . Hypertension   . Joint pain   . Prostate cancer (Robert Short   a.  s/p prostatectomy ([facility]);  b. bone mets noted 04/2016.  . Right arm pain 01/10/2016  . Right foot pain 01/10/2016  . Right leg pain 01/10/2016  . Tobacco  abuse     PAST SURGICAL HISTORY :   Past Surgical History:  Procedure Laterality Date  . CARDIAC CATHETERIZATION     [facility]  . CARDIAC CATHETERIZATION N/A 08/16/2015   Procedure: Left Heart Cath and Coronary Angiography;   Surgeon: Robert Kida, MD;  Location: [place] [facility];  Service: Cardiovascular;  Laterality: N/A;  . CARDIAC CATHETERIZATION N/A 08/16/2015   Procedure: Coronary Stent Intervention;  Surgeon: Robert Kida, MD;  Location: [place] [facility];  Service: Cardiovascular;  Laterality: N/A;  . CERVIX SURGERY    . PROSTATECTOMY    . SPINE SURGERY    . TONSILLECTOMY    . WRIST SURGERY      FAMILY HISTORY :   Family History  Problem Relation Age of Onset  . Heart attack Mother   . Hypertension Mother   . Heart attack Father     SOCIAL HISTORY:   Social History   Tobacco Use  . Smoking status: Current Every Day Smoker    Packs/day: 0.50    Years: 40.00    Pack years: 20.00    Types: Cigarettes  . Smokeless tobacco: Current User    Types: Chew  . Tobacco comment: 1 pack every couple days   Substance Use Topics  . Alcohol use: Yes    Frequency: Never    Comment: occasionally  . Drug use: No    ALLERGIES:  is allergic to ditropan [oxybutynin].  MEDICATIONS:  Current Outpatient Medications  Medication Sig Dispense Refill  . acetaminophen (TYLENOL) 500 MG tablet Take 1,000 mg by mouth every 8 (eight) hours as needed.     Marland Kitchen albuterol (PROVENTIL HFA;VENTOLIN HFA) 108 (90 Base) MCG/ACT inhaler Inhale 1-2 puffs into the lungs every 6 (six) hours as needed for wheezing or shortness of breath. 1 Inhaler 0  . apalutamide (ERLEADA) 60 MG tablet Take 4 tablets (240 mg total) by mouth daily. 120 tablet 6  . aspirin 81 MG tablet Take 81 mg by mouth daily.    Marland Kitchen atorvastatin (LIPITOR) 80 MG tablet TAKE 1 TABLET BY MOUTH DAILY AT 6 PM. 90 tablet 3  . chlorhexidine (PERIDEX) 0.12 % solution     . clopidogrel (PLAVIX) 75 MG tablet TAKE 1 TABLET BY MOUTH DAILY 90 tablet 0  . fentaNYL (DURAGESIC) 100 MCG/HR Place 1 patch onto the skin every other day. Along with 34mg patch for total of 125 mcg every 2 days. 15 patch 0  . fentaNYL (DURAGESIC) 25 MCG/HR Place 1 patch onto the skin  every other day. Use this along with fentanyl patch 100 mcg [total 125 mcg] every 2 days 15 patch 0  . gabapentin (NEURONTIN) 100 MG capsule START 1 CAPSULE DAILY, INCREASE BY 1 CAP EVERY 2 TO 3 DAYS AS TOLERATED UP TO 3 TIMES A DAY, OR MAY TAKE 3 AT ONCE IN [place] 90 capsule 5  . HYDROcodone-acetaminophen (NORCO) 10-325 MG tablet Take 1 tablet by mouth every 8 (eight) hours as needed. 90 tablet 0  . metoprolol tartrate (LOPRESSOR) 25 MG tablet TAKE 1/2 TABLET BY MOUTH 2 TIMES DAILY. 90 tablet 1  . mirtazapine (REMERON) 30 MG tablet Take 1 tablet (30 mg total) by mouth at bedtime. 30 tablet 1  . naloxone (NARCAN) nasal spray 4 mg/0.1 mL 1 spray into nostril upon signs of opioid overdose. Call 911. May repeat once if no response within 2-3 minutes. 1 kit 0  . nitroGLYCERIN (NITROSTAT) 0.4 MG SL tablet Place 0.4 mg under the tongue every  5 (five) minutes as needed.     . polyethylene glycol (MIRALAX / GLYCOLAX) packet Take 17 g by mouth daily as needed for moderate constipation.    . predniSONE (DELTASONE) 5 MG tablet TAKE 1 TABLET BY MOUTH DAILY WITH BREAKFAST. 30 tablet 1  . promethazine (PHENERGAN) 25 MG tablet TAKE 1/2 TO 1 TABLET (12.5-25 MG TOTAL) BY MOUTH EVERY 8 (EIGHT) HOURS AS NEEDED FOR NAUSEA OR VOMITING. 30 tablet 3  . traZODone (DESYREL) 50 MG tablet     . Wheat Dextrin (BENEFIBER) POWD Stir 2 tsp. TID into 4-8 oz of any non-carbonated beverage or soft food (hot or cold) 500 g PRN   No current facility-administered medications for this visit.     PHYSICAL EXAMINATION: ECOG PERFORMANCE STATUS: 1 - Symptomatic but completely ambulatory  BP 132/82 (BP Location: Left Arm, Patient Position: Sitting)   Pulse 75   Temp (!) 97.2 F (36.2 Short) (Tympanic)   Wt 162 lb (73.5 kg)   BMI 22.59 kg/m   Filed Weights   10/04/18 1521  Weight: 162 lb (73.5 kg)    Physical Exam  Constitutional: He is oriented to person, place, and time.   Frail-appearing Caucasian male patient.  He is  walking with a cane.    HENT:  Head: Normocephalic and atraumatic.  Mouth/Throat: Oropharynx is clear and moist. No oropharyngeal exudate.  Poor dentition.  Eyes: Pupils are equal, round, and reactive to light.  Neck: Normal range of motion. Neck supple.  Cardiovascular: Normal rate and regular rhythm.  Pulmonary/Chest: No respiratory distress. He has no wheezes.  Decreased breath sounds bilaterally at the bases.  No wheeze or crackles  Abdominal: Soft. Bowel sounds are normal. He exhibits no distension and no mass. There is no abdominal tenderness. There is no rebound and no guarding.  Musculoskeletal: Normal range of motion.        General: No tenderness or edema.  Neurological: He is alert and oriented to person, place, and time.  Skin: Skin is warm.  Psychiatric: Affect normal.       LABORATORY DATA:  I have reviewed the data as listed    Component Value Date/Time   NA 135 10/03/2018 1308   NA 140 07/13/2013 1542   K 3.5 10/03/2018 1308   K 4.3 07/13/2013 1542   CL 100 10/03/2018 1308   CL 107 07/13/2013 1542   CO2 28 10/03/2018 1308   CO2 26 07/13/2013 1542   GLUCOSE 157 (H) 10/03/2018 1308   GLUCOSE 106 (H) 07/13/2013 1542   BUN 16 10/03/2018 1308   BUN 11 07/13/2013 1542   CREATININE 0.80 10/03/2018 1308   CREATININE 0.76 10/15/2017 1001   CALCIUM 9.6 10/03/2018 1308   CALCIUM 9.9 07/13/2013 1542   PROT 6.3 (L) 10/03/2018 1308   PROT 8.1 07/13/2013 1542   ALBUMIN 3.7 10/03/2018 1308   ALBUMIN 4.3 07/13/2013 1542   AST 16 10/03/2018 1308   AST 26 07/13/2013 1542   ALT 10 10/03/2018 1308   ALT 32 07/13/2013 1542   ALKPHOS 42 10/03/2018 1308   ALKPHOS 74 07/13/2013 1542   BILITOT 0.3 10/03/2018 1308   BILITOT 0.4 07/13/2013 1542   GFRNONAA >60 10/03/2018 1308   GFRNONAA 95 10/15/2017 1001   GFRAA >60 10/03/2018 1308   GFRAA 110 10/15/2017 1001    No results found for: SPEP, UPEP  Lab Results  Component Value Date   WBC 5.5 10/03/2018   NEUTROABS 2.9  10/03/2018   HGB 12.1 (L) 10/03/2018  HCT 37.2 (L) 10/03/2018   MCV 96.4 10/03/2018   PLT 181 10/03/2018      Chemistry      Component Value Date/Time   NA 135 10/03/2018 1308   NA 140 07/13/2013 1542   K 3.5 10/03/2018 1308   K 4.3 07/13/2013 1542   CL 100 10/03/2018 1308   CL 107 07/13/2013 1542   CO2 28 10/03/2018 1308   CO2 26 07/13/2013 1542   BUN 16 10/03/2018 1308   BUN 11 07/13/2013 1542   CREATININE 0.80 10/03/2018 1308   CREATININE 0.76 10/15/2017 1001      Component Value Date/Time   CALCIUM 9.6 10/03/2018 1308   CALCIUM 9.9 07/13/2013 1542   ALKPHOS 42 10/03/2018 1308   ALKPHOS 74 07/13/2013 1542   AST 16 10/03/2018 1308   AST 26 07/13/2013 1542   ALT 10 10/03/2018 1308   ALT 32 07/13/2013 1542   BILITOT 0.3 10/03/2018 1308   BILITOT 0.4 07/13/2013 1542       RADIOGRAPHIC STUDIES: I have personally reviewed the radiological images as listed and agreed with the findings in the report. No results found.   ASSESSMENT & PLAN:  Prostate cancer metastatic to bone [facility]) Worsening# Castrate resistant prostate cancer metastases to bone. Lupron every 3 months [06/07/2018]. On Apalutamide 240 mg/day-? Sep 2020; PSA- 190; rising.   #Clinically-worsening.  Discussed that given the progressive disease noted on anti-hormonal therapy I would recommend systemic chemotherapy with docetaxel.  Discussed chemotherapy would be offered.  3 weeks x 6 cycles.  However patient still reluctant with chemotherapeutic options.  He states that he might consider chemotherapy based upon the results of the PET scan.  PET scan ordered.  #Increased frequency of urination-declines urology evaluation.  # Depression/ anxiety-stable continue Remeron.  # Malignancy related pain- continue fentanyl patch hydrocodone;. Continue fentanyl patch to every 48 hours and continue hydrocodone.  Stable  # DISPOSITION:  # lupron today.  #Follow-up visit in 2 weeks-MD;no labs;  PET prior  # josh in 2  weeks--Dr.B   Orders Placed This Encounter  Procedures  . NM PET (AXUMIN) SKULL BASE TO MID THIGH    Standing Status:   Future    Standing Expiration Date:   12/04/2019    Order Specific Question:   ** REASON FOR EXAM (FREE TEXT)    Answer:   prostate cancer    Order Specific Question:   If indicated for the ordered procedure, I authorize the administration of a radiopharmaceutical per Radiology protocol    Answer:   Yes    Order Specific Question:   Preferred imaging location?    Answer:   [facility]    Order Specific Question:   Radiology Contrast Protocol - do NOT remove file path    Answer:   [email or web address]   All questions were answered. The patient knows to call the clinic with any problems, questions or concerns.      [name], MD 10/13/2018 9:42 PM

## 2018-10-14 ENCOUNTER — Other Ambulatory Visit: Payer: 59

## 2018-10-14 DIAGNOSIS — Z0000 Encounter for general adult medical examination without abnormal findings: Secondary | ICD-10-CM

## 2018-10-15 ENCOUNTER — Other Ambulatory Visit: Payer: Self-pay

## 2018-10-15 ENCOUNTER — Other Ambulatory Visit
Admission: RE | Admit: 2018-10-15 | Discharge: 2018-10-15 | Disposition: A | Discharge status: Home / Self Care | Payer: 59 | Attending: Family Medicine | Admitting: Family Medicine

## 2018-10-15 ENCOUNTER — Other Ambulatory Visit: Payer: Self-pay | Admitting: Family Medicine

## 2018-10-15 DIAGNOSIS — Z0000 Encounter for general adult medical examination without abnormal findings: Secondary | ICD-10-CM | POA: Diagnosis not present

## 2018-10-15 DIAGNOSIS — R7303 Prediabetes: Secondary | ICD-10-CM | POA: Insufficient documentation

## 2018-10-15 LAB — HEMOGLOBIN A1C
Hgb A1c MFr Bld: 5.7 % — ABNORMAL HIGH (ref 4.8–5.6)
Mean Plasma Glucose: 116.89 mg/dL

## 2018-10-15 LAB — LIPID PANEL
Cholesterol: 266 mg/dL — ABNORMAL HIGH (ref 0–200)
HDL: 54 mg/dL (ref >40)
LDL Cholesterol: 179 mg/dL — ABNORMAL HIGH (ref 0–99)
Total CHOL/HDL Ratio: 4.9 RATIO
Triglycerides: 165 mg/dL — ABNORMAL HIGH (ref <150)
VLDL: 33 mg/dL (ref 0–40)

## 2018-10-17 ENCOUNTER — Ambulatory Visit
Admission: RE | Admit: 2018-10-17 | Discharge: 2018-10-17 | Disposition: A | Discharge status: Home / Self Care | Payer: 59 | Source: Ambulatory Visit | Attending: Internal Medicine | Admitting: Internal Medicine

## 2018-10-17 ENCOUNTER — Other Ambulatory Visit: Payer: Self-pay

## 2018-10-17 DIAGNOSIS — C7951 Secondary malignant neoplasm of bone: Secondary | ICD-10-CM | POA: Diagnosis not present

## 2018-10-17 DIAGNOSIS — R911 Solitary pulmonary nodule: Secondary | ICD-10-CM | POA: Diagnosis not present

## 2018-10-17 DIAGNOSIS — C61 Malignant neoplasm of prostate: Secondary | ICD-10-CM | POA: Insufficient documentation

## 2018-10-17 DIAGNOSIS — R590 Localized enlarged lymph nodes: Secondary | ICD-10-CM | POA: Diagnosis not present

## 2018-10-17 MED ORDER — AXUMIN (FLUCICLOVINE F 18) INJECTION
10.3000 | Freq: Once | INTRAVENOUS | Status: AC | PRN
  Administered 2018-10-17: 10.3 via INTRAVENOUS

## 2018-10-18 ENCOUNTER — Encounter: Payer: 59 | Admitting: Family Medicine

## 2018-10-18 ENCOUNTER — Inpatient Hospital Stay (HOSPITAL_BASED_OUTPATIENT_CLINIC_OR_DEPARTMENT_OTHER): Payer: 59 | Admitting: Hospice and Palliative Medicine

## 2018-10-18 ENCOUNTER — Other Ambulatory Visit: Payer: Self-pay

## 2018-10-18 ENCOUNTER — Inpatient Hospital Stay (HOSPITAL_BASED_OUTPATIENT_CLINIC_OR_DEPARTMENT_OTHER): Payer: 59 | Admitting: Internal Medicine

## 2018-10-18 ENCOUNTER — Encounter: Payer: Self-pay | Admitting: Internal Medicine

## 2018-10-18 DIAGNOSIS — C7951 Secondary malignant neoplasm of bone: Secondary | ICD-10-CM | POA: Diagnosis not present

## 2018-10-18 DIAGNOSIS — Z515 Encounter for palliative care: Secondary | ICD-10-CM | POA: Diagnosis not present

## 2018-10-18 DIAGNOSIS — C61 Malignant neoplasm of prostate: Secondary | ICD-10-CM | POA: Diagnosis not present

## 2018-10-18 DIAGNOSIS — F17210 Nicotine dependence, cigarettes, uncomplicated: Secondary | ICD-10-CM | POA: Diagnosis not present

## 2018-10-18 DIAGNOSIS — Z79899 Other long term (current) drug therapy: Secondary | ICD-10-CM | POA: Diagnosis not present

## 2018-10-18 DIAGNOSIS — Z7189 Other specified counseling: Secondary | ICD-10-CM | POA: Insufficient documentation

## 2018-10-18 DIAGNOSIS — G893 Neoplasm related pain (acute) (chronic): Secondary | ICD-10-CM

## 2018-10-18 DIAGNOSIS — T451X5A Adverse effect of antineoplastic and immunosuppressive drugs, initial encounter: Secondary | ICD-10-CM | POA: Diagnosis not present

## 2018-10-18 DIAGNOSIS — G629 Polyneuropathy, unspecified: Secondary | ICD-10-CM | POA: Diagnosis not present

## 2018-10-18 DIAGNOSIS — R5383 Other fatigue: Secondary | ICD-10-CM | POA: Diagnosis not present

## 2018-10-18 DIAGNOSIS — Z5111 Encounter for antineoplastic chemotherapy: Secondary | ICD-10-CM | POA: Diagnosis not present

## 2018-10-18 MED ORDER — OXYCODONE HCL 10 MG PO TABS: 10.0000 mg | ORAL_TABLET | ORAL | 0 refills | Status: DC | PRN

## 2018-10-18 MED ORDER — ONDANSETRON HCL 8 MG PO TABS: ORAL_TABLET | ORAL | 1 refills | Status: DC

## 2018-10-18 MED ORDER — DEXAMETHASONE 4 MG PO TABS: ORAL_TABLET | ORAL | 1 refills | Status: DC

## 2018-10-18 NOTE — Progress Notes (Signed)
[name]  Telephone:([phone] [phone]   Name: Robert Short Date: 10/18/2018 MRN: [id number]  DOB: [birth year]  Patient Care Team: Olin Hauser, [name], [name], [name]    REASON FOR CONSULTATION: Palliative Care consult requested for this 68 y.o. male with multiple medical problems including stage IV prostate cancer metastatic to bone status post Xtandi.  Patient has had multiple symptoms including fatigue and pain.  He was referred to palliative care to help with symptom management and to address goals.  SOCIAL HISTORY:     reports that he has been smoking cigarettes. He has a 20.00 pack-year smoking history. His smokeless tobacco use includes chew. He reports current alcohol use. He reports that he does not use drugs.   Patient is married and lives at home with his wife. He has five children. Patient used to work as a Pharmacist, hospital, Administrator, and Engineer, production.  ADVANCE DIRECTIVES:  Does not have  CODE STATUS:   PAST MEDICAL HISTORY: Past Medical History:  Diagnosis Date   Back pain 10/09/2012   Bone cancer ([place])    CAD (coronary artery disease)    a. 08/2015 PCI: LM nl, LAD 20d, LCX nl, RCA 52m RPAV 95 (3.0x18 Xience Alpine DES);  b. [place] RPL 95 (2.75x8 Promus Premier DES).   Cancer associated pain    Depression    History of echocardiogram    a. 08/2016 Echo: EF 50-55%.   History of kidney stones    Hyperlipidemia    Hypertension    Joint pain    [place]    a.  s/p prostatectomy ([facility]);  b. bone mets noted 04/2016.   Right arm pain 01/10/2016   Right foot pain 01/10/2016   Right leg pain 01/10/2016   Tobacco abuse     PAST SURGICAL HISTORY:  Past Surgical History:  Procedure Laterality Date    CARDIAC CATHETERIZATION     [facility]   CARDIAC CATHETERIZATION N/A 08/16/2015   Procedure: Left Heart Cath and Coronary Angiography;  Surgeon: DYolonda Kida MD;  [facility];  Service: Cardiovascular;  Laterality: N/A;   CARDIAC CATHETERIZATION N/A 08/16/2015   Procedure: Coronary Stent Intervention;  Surgeon: DYolonda Kida MD;  [facility];  Service: Cardiovascular;  Laterality: N/A;   CERVIX SURGERY     PROSTATECTOMY     SPINE SURGERY     TONSILLECTOMY     WRIST SURGERY      HEMATOLOGY/ONCOLOGY HISTORY:  Oncology History Overview Note  # 2011- PROSTATE CANCER [Gleason 3+4]; s/p Prostatectomy [ also involved bladder neck/ECP; Dr.Polaseck]; July 2014- Biochemical recurrence [PSA 14]- started on Zoladex [Dr.Pandit]; lost to follow up.  # JAN 2017- STAGE IV METASTATIC PROSTATE Cancer to Bone- Feb 13th, 2017-  Lupron q 338m~end of feb]; PSA: 1021; Declined Chemo; April 2017 [xofigo x6; Dr.Crystal]; AUG 2017- Zytiga + Prednisone BID. Bone scan-Jan 2018- improved skeletal metastases.   # MAY 1st 2019- START X-tandi [stopped zytiga/PSA- 8.8/rising]  #August 23, 2018-stop Xtandi [intolerance-fatigue mental fogginess]  #Mid September 2020-apalutamide  # Mets to bone- start X-geva q 68M [May 30th]; hold dental issues/infections  # Smoker/ chronic pain/pain clinic   #[April 2018; [place]]- s/p stenting; on Plavix; --------------------------------------------------------------  DIAGNOSIS: '[ ]'  Castrate resistant prostate cancer  STAGE:   4    ;  GOALS: Palliative  CURRENT/MOST RECENT THERAPY-apalutamide/mid September 2020    Prostate cancer metastatic to bone [facility])  12/08/2014 Initial Diagnosis   Prostate cancer metastatic to bone ([place])   11/01/2018 -  Chemotherapy   The patient had DOCEtaxel (TAXOTERE) 70 mg in sodium chloride 0.9 % 150 mL chemo infusion, 36 mg/m2, Intravenous,  Once, 0 of 6 cycles  for chemotherapy treatment.       ALLERGIES:  is allergic to ditropan [oxybutynin].  MEDICATIONS:  Current Outpatient Medications  Medication Sig Dispense Refill   acetaminophen (TYLENOL) 500 MG tablet Take 1,000 mg by mouth every 8 (eight) hours as needed.      albuterol (PROVENTIL HFA;VENTOLIN HFA) 108 (90 Base) MCG/ACT inhaler Inhale 1-2 puffs into the lungs every 6 (six) hours as needed for wheezing or shortness of breath. 1 Inhaler 0   apalutamide (ERLEADA) 60 MG tablet Take 4 tablets (240 mg total) by mouth daily. 120 tablet 6   aspirin 81 MG tablet Take 81 mg by mouth daily.     atorvastatin (LIPITOR) 80 MG tablet TAKE 1 TABLET BY MOUTH DAILY AT 6 PM. 90 tablet 3   chlorhexidine (PERIDEX) 0.12 % solution      clopidogrel (PLAVIX) 75 MG tablet TAKE 1 TABLET BY MOUTH DAILY 90 tablet 0   dexamethasone (DECADRON) 4 MG tablet Take one pill AM & PM x 3 days; start the day prior to chemo. 60 tablet 1   fentaNYL (DURAGESIC) 100 MCG/HR Place 1 patch onto the skin every other day. Along with 81mg patch for total of 125 mcg every 2 days. 15 patch 0   fentaNYL (DURAGESIC) 25 MCG/HR Place 1 patch onto the skin every other day. Use this along with fentanyl patch 100 mcg [total 125 mcg] every 2 days 15 patch 0   gabapentin (NEURONTIN) 100 MG capsule START 1 CAPSULE DAILY, INCREASE BY 1 CAP EVERY 2 TO 3 DAYS AS TOLERATED UP TO 3 TIMES A DAY, OR MAY TAKE 3 AT [place]CE [place] 90 capsule 5   HYDROcodone-acetaminophen (NORCO) 10-325 MG tablet Take 1 tablet by mouth every 8 (eight) hours as needed. 90 tablet 0   metoprolol tartrate (LOPRESSOR) 25 MG tablet TAKE 1/2 TABLET BY MOUTH 2 TIMES DAILY. 90 tablet 1   mirtazapine (REMERON) 30 MG tablet Take 1 tablet (30 mg total) by mouth at bedtime. 30 tablet 1   naloxone (NARCAN) nasal spray 4 mg/0.1 mL 1 spray into nostril upon signs of opioid overdose. Call 911. May repeat once if no response within 2-3 minutes. 1 kit 0   nitroGLYCERIN (NITROSTAT) 0.4 MG SL tablet Place 0.4  mg under the tongue every 5 (five) minutes as needed.      ondansetron (ZOFRAN) 8 MG tablet One pill every 8 hours as needed for nausea/vomitting. 40 tablet 1   polyethylene glycol (MIRALAX / GLYCOLAX) packet Take 17 g by mouth daily as needed for moderate constipation.     predniSONE (DELTASONE) 5 MG tablet TAKE 1 TABLET BY MOUTH DAILY WITH BREAKFAST. 30 tablet 1   promethazine (PHENERGAN) 25 MG tablet TAKE 1/2 TO 1 TABLET (12.5-25 MG TOTAL) BY MOUTH EVERY 8 (EIGHT) HOURS AS NEEDED FOR NAUSEA OR VOMITING. 30 tablet 3   traZODone (DESYREL) 50 MG tablet      Wheat Dextrin (BENEFIBER) POWD Stir 2 tsp. TID into 4-8 oz of any non-carbonated beverage or soft food (hot or cold) 500 g PRN   No current facility-administered medications for this visit.  VITAL SIGNS: There were no vitals taken for this visit. There were no vitals filed for this visit.  Estimated body mass index is 22.32 kg/m as calculated from the following:   Height as of 08/23/18: '5\' 11"'  (1.803 m).   Weight as of an earlier encounter on 10/18/18: 160 lb (72.6 kg).  LABS: CBC:    Component Value Date/Time   WBC 5.5 10/03/2018 1308   HGB 12.1 (L) 10/03/2018 1308   HGB 14.4 11/03/2013 1619   HCT 37.2 (L) 10/03/2018 1308   HCT 44.2 11/03/2013 1619   PLT 181 10/03/2018 1308   PLT 149 (L) 11/03/2013 1619   MCV 96.4 10/03/2018 1308   MCV 96 11/03/2013 1619   NEUTROABS 2.9 10/03/2018 1308   NEUTROABS 10.7 (H) 11/03/2013 1619   LYMPHSABS 1.7 10/03/2018 1308   LYMPHSABS 2.0 11/03/2013 1619   MONOABS 0.6 10/03/2018 1308   MONOABS 1.3 (H) 11/03/2013 1619   EOSABS 0.2 10/03/2018 1308   EOSABS 0.1 11/03/2013 1619   BASOSABS 0.0 10/03/2018 1308   BASOSABS 0.1 11/03/2013 1619   Comprehensive Metabolic Panel:    Component Value Date/Time   NA 135 10/03/2018 1308   NA 140 07/13/2013 1542   K 3.5 10/03/2018 1308   K 4.3 07/13/2013 1542   CL 100 10/03/2018 1308   CL 107 07/13/2013 1542   CO2 28 10/03/2018 1308   CO2  26 07/13/2013 1542   BUN 16 10/03/2018 1308   BUN 11 07/13/2013 1542   CREATININE 0.80 10/03/2018 1308   CREATININE 0.76 10/15/2017 1001   GLUCOSE 157 (H) 10/03/2018 1308   GLUCOSE 106 (H) 07/13/2013 1542   CALCIUM 9.6 10/03/2018 1308   CALCIUM 9.9 07/13/2013 1542   AST 16 10/03/2018 1308   AST 26 07/13/2013 1542   ALT 10 10/03/2018 1308   ALT 32 07/13/2013 1542   ALKPHOS 42 10/03/2018 1308   ALKPHOS 74 07/13/2013 1542   BILITOT 0.3 10/03/2018 1308   BILITOT 0.4 07/13/2013 1542   PROT 6.3 (L) 10/03/2018 1308   PROT 8.1 07/13/2013 1542   ALBUMIN 3.7 10/03/2018 1308   ALBUMIN 4.3 07/13/2013 1542    RADIOGRAPHIC STUDIES: Nm Pet (axumin) Skull Base To Mid Thigh  Result Date: 10/17/2018 CLINICAL DATA:  Prostate carcinoma with biochemical recurrence. EXAM: NUCLEAR MEDICINE PET SKULL BASE TO THIGH TECHNIQUE: 10.3 mCi F-18 Fluciclovine was injected intravenously. Full-ring PET imaging was performed from the skull base to thigh after the radiotracer. CT data was obtained and used for attenuation correction and anatomic localization. COMPARISON:  PET-CT 01/23/2017 FINDINGS: NECK No radiotracer activity in neck lymph nodes. Incidental CT finding: None CHEST Within the LEFT suprahilar region intense focus radiotracer activity localizes to ill-defined tissue on this noncontrast CT. Activity is intense activity SUV max equal 7.7 (image 229) Several small lymph nodes which accumulate radiotracer. For example small prevascular lymph node measuring 5 mm with SUV max equal 5.9. Similar RIGHT lower paratracheal lymph node similar radiotracer activity (SUV max equal 5.3). This multifocal mediastinal nodal activity is new from comparison exam. Incidental CT finding: 4 mm nodule in the RIGHT lower lobe (image 123/3 is unchanged no new pulmonary nodules. ABDOMEN/PELVIS Prostate: No focal activity in the prostate bed. Lymph nodes: No abnormal radiotracer accumulation within pelvic or abdominal nodes. Liver: No  evidence of liver metastasis Incidental CT finding: None SKELETON Several new foci of radiotracer accumulation on the background of diffuse sclerotic skeletal metastasis. Example within the LEFT iliac bone SUV max equal 4.3 lesion. Lesion at  the L 3 vertebral body with SUV max equal 8.9. No clear CT changes of other than the diffuse sclerotic lesions described previously. Additional foci in the thoracic spine. Lesion in the RIGHT scapula beneath the glenoid fossa with SUV max equal 6.1. Two lesions within the manubrium with SUV max equal 7.0 IMPRESSION: 1. Progression of prostate cancer metastasis. There are new LEFT suprahilar and small mediastinal lymph nodes with accumulate the prostate cancer specific radiotracer most consistent with mediastinal nodal metastasis. 2. New foci of radiotracer uptake on the background of diffuse skeletal metastasis. Discrete radiotracer avid lesions are present in the iliac bones, lumbar spine, manubrium, and RIGHT scapula. Additional lesions are present. Electronically Signed   By: Suzy Bouchard M.D.   On: 10/17/2018 15:36    PERFORMANCE STATUS (ECOG) : 2 - Symptomatic, <50% confined to bed  Review of Systems Unless otherwise noted, a complete review of systems is negative.  Physical Exam General: NAD, frail appearing, thin Pulmonary: Unlabored Extremities: no edema, no joint deformities Skin: no rashes Neurological: Weakness but otherwise nonfocal  IMPRESSION: Routine follow-up visit today.  Patient reports persistent fatigue and pain.  He has insomnia and overall does not feel well.  He also reports his appetite is poor.  Case discussed with Dr. Rogue Bussing, who also saw patient today.  PET scan reveals disease progression.  Patient is being rotated to docetaxel, with plan to give weekly dosing due to frailty.  Patient says that he recognizes that the cancer will ultimately result in his passing.  He remains committed to the idea of pursuing treatment for  now.  We will need to have a more in-depth conversation regarding his goals and established advance care plans.  Regarding the pain, patient remains on transdermal fentanyl 125 mcg every 72 hours and is taking hydrocodone 10-325 mg 3-4 times a day.  He says that his pain averages 5 out of 10 but will drop as low as 2 out of 10 and climb as high as 9 out of 10 during any given day.  Patient says he feels that he has now tolerant to the pain medications.  We will continue transdermal fentanyl and plan to rotate him from Norco to oxycodone.  It appears that he was tried on oxycodone several years ago but patient cannot recall how effective that medication was and is willing to try it again.  Patient says that he is emotionally coping with his illness.  I do suspect that he has residual depression and anxiety.  It does not sound like patient is consistently taking his mirtazapine.  PDMP reviewed  PLAN: -Continue current scope of treatment -Stop Norco -Start oxycodone 10 mg every 4 to 6 hours as needed for  breakthrough pain (#60) -Trazodone 50 to 100 mg nightly for sleep -Consider stopping mirtazapine and starting an SSRI, which may be more energizing -We will need to establish ACP -RTC in 2-3 weeks   Patient expressed understanding and was in agreement with this plan. He also understands that He can call the clinic at any time with any questions, concerns, or complaints.     Time Total: 20 minutes  Visit consisted of counseling and education dealing with the complex and emotionally intense issues of symptom management and palliative care in the setting of serious and potentially life-threatening illness.Greater than 50%  of this time was spent counseling and coordinating care related to the above assessment and plan.  Signed by: Altha Harm, PhD, NP-C [phone] (Work Cell)

## 2018-10-18 NOTE — Progress Notes (Signed)
Patient on plan of care prior to pathways. 

## 2018-10-18 NOTE — Assessment & Plan Note (Addendum)
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 Lupron every 3 months [09/07/2018]. On Apalutamide 240 mg/day-? Sep 2020; PSA- 190; rising. PET aux- 10/15- Progressive bone/ Left suprahilar-mediastinal LN. recommend discontinuation of apalutamide when the progression of disease/poor tolerance  #Recommend Taxotere-for palliative reasons.  Given the patient extreme concern for side effects/reluctance with chemotherapy overall-reasonable to recommend Taxotere weekly.  Discussed regarding dexamethasone premedication/antiemetics  # Discussed the potential side effects including but not limited to-increasing fatigue, nausea vomiting, diarrhea, hair loss, sores in the mouth, increase risk of infection and also neuropathy.   # Depression/ anxiety-stable continue Remeron.  # Malignancy related pain- continue fentanyl patch hydrocodone;. Continue fentanyl patch to every 48 hours and continue hydrocodone. Overall stable; follow up with Josh.   # I spoke at length with the patient's wife, Jenny Reichmann- regarding the patient's clinical status/plan of care.  Family agreement.   # DISPOSITION:  # chemo education- weekly taxotere # covid test prior chemo- # Follow-up visit in 2 weeks-X-MD; cbc/cmp/psa- Taxotere weekly-DrB   # josh in 2 weeks--Dr.B  # 40 minutes face-to-face with the patient discussing the above plan of care; more than 50% of time spent on prognosis/ natural history; counseling and coordination.

## 2018-10-18 NOTE — Progress Notes (Signed)
START ON PATHWAY REGIMEN - Prostate     A cycle is every 21 days:     Docetaxel      Prednisone   **Always confirm dose/schedule in your pharmacy ordering system**  Patient Characteristics: Adenocarcinoma, Distant Metastases, Castration Resistant, Symptomatic, Docetaxel Eligible Histology: Adenocarcinoma Therapeutic Status: Distant Metastases  Intent of Therapy: Non-Curative / Palliative Intent, Discussed with Patient 

## 2018-10-19 NOTE — Progress Notes (Signed)
[facility] OFFICE PROGRESS NOTE  Patient Care Team: Olin Hauser, [name], [name], [name]  Cancer Staging No matching staging information was found for the patient.   Oncology History Overview Note  # 2011- PROSTATE CANCER [Gleason 3+4]; s/p Prostatectomy [ also involved bladder neck/ECP; Dr.Polaseck]; July 2014- Biochemical recurrence [PSA 14]- started on Zoladex [Dr.Pandit]; lost to follow up.  # JAN 2017- STAGE IV METASTATIC PROSTATE Cancer to Bone- Feb 13th, 2017-  Lupron q 46m[~end of feb]; PSA: 1021; Declined Chemo; April 2017 [xofigo x6; Dr.Crystal]; AUG 2017- Zytiga + Prednisone BID. Bone scan-Jan 2018- improved skeletal metastases.   # MAY 1st 2019- START X-tandi [stopped zytiga/PSA- 8.8/rising]  #August 23, 2018-stop Xtandi [intolerance-fatigue mental fogginess]  #Mid September 2020-apalutamide; stopped mid-October poor tolerance/progression  # Late Oct-Taxotere weekly  # Mets to bone- start X-geva q 40M [May 30th]; hold dental issues/infections  # Smoker/ chronic pain/pain clinic   #[April 2018; [place]]- s/p stenting; on Plavix; --------------------------------------------------------------  DIAGNOSIS: '[ ]'  Castrate resistant prostate cancer  STAGE:   4    ;GOALS: Palliative  CURRENT/MOST RECENT THERAPY-Late Oct 2020- Taxotere weekly    Prostate cancer metastatic to bone ([facility]  12/08/2014 Initial Diagnosis   Prostate cancer metastatic to bone ([facility]   11/01/2018 -  Chemotherapy   The patient had DOCEtaxel (TAXOTERE) 70 mg in sodium chloride 0.9 % 150 mL chemo infusion, 36 mg/m2, Intravenous,  Once, 0 of 6 cycles  for chemotherapy treatment.     INTERVAL HISTORY:  Robert BRAUER89y.o.  male pleasant patient above history of metastatic castrate resistant prostate cancer currently  apalutamide for the last 6 weeks is here for follow-up/review the results of the PET scan.  Patient continues to feel poorly.  Continues to complain of overall worsening of joint pains bone pain.  Extreme fatigue.    Denies any jaw swelling or jaw pain.  Review of Systems  Constitutional: Positive for malaise/fatigue and weight loss. Negative for chills, diaphoresis and fever.  HENT: Negative for nosebleeds and sore throat.   Eyes: Negative for double vision.  Respiratory: Positive for shortness of breath. Negative for cough, hemoptysis, sputum production and wheezing.   Cardiovascular: Negative for chest pain, palpitations, orthopnea and leg swelling.  Gastrointestinal: Positive for constipation. Negative for abdominal pain, blood in stool, diarrhea, heartburn, melena, nausea and vomiting.  Genitourinary: Negative for dysuria, frequency and urgency.  Musculoskeletal: Positive for back pain and joint pain.  Skin: Negative.  Negative for itching and rash.  Neurological: Negative for dizziness, tingling, focal weakness, weakness and headaches.  Endo/Heme/Allergies: Does not bruise/bleed easily.  Psychiatric/Behavioral: Negative for depression. The patient is not nervous/anxious and does not have insomnia.       PAST MEDICAL HISTORY :  Past Medical History:  Diagnosis Date  . Back pain 10/09/2012  . Bone cancer ([place]   . CAD (coronary artery disease)    a. 08/2015 PCI: LM nl, LAD 20d, LCX nl, RCA 232mRPAV 95 (3.0x18 Xience Alpine DES);  b. [facility] R[facility] 95 (2.75x8 Promus Premier DES).  . Cancer associated pain   . Depression   . History of echocardiogram    a. 08/2016 Echo: EF 50-55%.  . Marland Kitchenistory of kidney stones   . Hyperlipidemia   . Hypertension   . Joint pain   . Prostate cancer ([facility]   a.  s/p prostatectomy ([facility]);  b. bone mets noted 04/2016.  . Right arm pain 01/10/2016  . Right foot pain 01/10/2016  . Right leg pain 01/10/2016  . Tobacco abuse     PAST  SURGICAL HISTORY :   Past Surgical History:  Procedure Laterality Date  . CARDIAC CATHETERIZATION     [facility]  . CARDIAC CATHETERIZATION N/A 08/16/2015   Procedure: Left Heart Cath and Coronary Angiography;  Surgeon: Yolonda Kida, MD;  Location: [facility];  Service: Cardiovascular;  Laterality: N/A;  . CARDIAC CATHETERIZATION N/A 08/16/2015   Procedure: Coronary Stent Intervention;  Surgeon: Yolonda Kida, MD;  Location: [facility];  Service: Cardiovascular;  Laterality: N/A;  . CERVIX SURGERY    . PROSTATECTOMY    . SPINE SURGERY    . TONSILLECTOMY    . WRIST SURGERY      FAMILY HISTORY :   Family History  Problem Relation Age of Onset  . Heart attack Mother   . Hypertension Mother   . Heart attack Father     SOCIAL HISTORY:   Social History   Tobacco Use  . Smoking status: Current Every Day Smoker    Packs/day: 0.50    Years: 40.00    Pack years: 20.00    Types: Cigarettes  . Smokeless tobacco: Current User    Types: Chew  . Tobacco comment: 1 pack every couple days   Substance Use Topics  . Alcohol use: Yes    Frequency: Never    Comment: occasionally  . Drug use: No    ALLERGIES:  is allergic to ditropan [oxybutynin].  MEDICATIONS:  Current Outpatient Medications  Medication Sig Dispense Refill  . acetaminophen (TYLENOL) 500 MG tablet Take 1,000 mg by mouth every 8 (eight) hours as needed.     Marland Kitchen albuterol (PROVENTIL HFA;VENTOLIN HFA) 108 (90 Base) MCG/ACT inhaler Inhale 1-2 puffs into the lungs every 6 (six) hours as needed for wheezing or shortness of breath. 1 Inhaler 0  . apalutamide (ERLEADA) 60 MG tablet Take 4 tablets (240 mg total) by mouth daily. 120 tablet 6  . aspirin 81 MG tablet Take 81 mg by mouth daily.    Marland Kitchen atorvastatin (LIPITOR) 80 MG tablet TAKE 1 TABLET BY MOUTH DAILY AT 6 PM. 90 tablet 3  . chlorhexidine (PERIDEX) 0.12 % solution     . clopidogrel (PLAVIX) 75 MG tablet TAKE 1 TABLET BY MOUTH DAILY 90 tablet 0  .  dexamethasone (DECADRON) 4 MG tablet Take one pill AM & PM x 3 days; start the day prior to chemo. 60 tablet 1  . fentaNYL (DURAGESIC) 100 MCG/HR Place 1 patch onto the skin every other day. Along with 82mg patch for total of 125 mcg every 2 days. 15 patch 0  . fentaNYL (DURAGESIC) 25 MCG/HR Place 1 patch onto the skin every other day. Use this along with fentanyl patch 100 mcg [total 125 mcg] every 2 days 15 patch 0  . gabapentin (NEURONTIN) 100 MG capsule START 1 CAPSULE DAILY, INCREASE BY 1 CAP EVERY 2 TO 3 DAYS AS TOLERATED UP TO 3 TIMES A DAY, OR MAY TAKE 3 AT ONCE IN [place] 90 capsule 5  . metoprolol tartrate (LOPRESSOR) 25 MG tablet TAKE 1/2 TABLET BY MOUTH 2 TIMES DAILY. 90 tablet 1  . mirtazapine (REMERON) 30 MG tablet Take 1 tablet (30 mg total) by mouth at bedtime. 30 tablet 1  . naloxone (NARCAN) nasal spray 4 mg/0.1 mL 1 spray into nostril  upon signs of opioid overdose. Call 911. May repeat once if no response within 2-3 minutes. 1 kit 0  . nitroGLYCERIN (NITROSTAT) 0.4 MG SL tablet Place 0.4 mg under the tongue every 5 (five) minutes as needed.     . ondansetron (ZOFRAN) 8 MG tablet One pill every 8 hours as needed for nausea/vomitting. 40 tablet 1  . Oxycodone HCl 10 MG TABS Take 1 tablet (10 mg total) by mouth every 4 (four) hours as needed. 60 tablet 0  . polyethylene glycol (MIRALAX / GLYCOLAX) packet Take 17 g by mouth daily as needed for moderate constipation.    . predniSONE (DELTASONE) 5 MG tablet TAKE 1 TABLET BY MOUTH DAILY WITH BREAKFAST. 30 tablet 1  . promethazine (PHENERGAN) 25 MG tablet TAKE 1/2 TO 1 TABLET (12.5-25 MG TOTAL) BY MOUTH EVERY 8 (EIGHT) HOURS AS NEEDED FOR NAUSEA OR VOMITING. 30 tablet 3  . traZODone (DESYREL) 50 MG tablet     . Wheat Dextrin (BENEFIBER) POWD Stir 2 tsp. TID into 4-8 oz of any non-carbonated beverage or soft food (hot or cold) 500 g PRN   No current facility-administered medications for this visit.     PHYSICAL EXAMINATION: ECOG  PERFORMANCE STATUS: 1 - Symptomatic but completely ambulatory  BP (!) 156/99   Pulse 77   Temp 97.8 F (36.6 C)   Resp 18   Wt 160 lb (72.6 kg)   BMI 22.32 kg/m   Filed Weights   10/18/18 1324  Weight: 160 lb (72.6 kg)    Physical Exam  Constitutional: He is oriented to person, place, and time.   Frail-appearing Caucasian male patient.  He is walking with a cane.    HENT:  Head: Normocephalic and atraumatic.  Mouth/Throat: Oropharynx is clear and moist. No oropharyngeal exudate.  Poor dentition.  Eyes: Pupils are equal, round, and reactive to light.  Neck: Normal range of motion. Neck supple.  Cardiovascular: Normal rate and regular rhythm.  Pulmonary/Chest: No respiratory distress. He has no wheezes.  Decreased breath sounds bilaterally at the bases.  No wheeze or crackles  Abdominal: Soft. Bowel sounds are normal. He exhibits no distension and no mass. There is no abdominal tenderness. There is no rebound and no guarding.  Musculoskeletal: Normal range of motion.        General: No tenderness or edema.  Neurological: He is alert and oriented to person, place, and time.  Skin: Skin is warm.  Psychiatric: Affect normal.       LABORATORY DATA:  I have reviewed the data as listed    Component Value Date/Time   NA 135 10/03/2018 1308   NA 140 07/13/2013 1542   K 3.5 10/03/2018 1308   K 4.3 07/13/2013 1542   CL 100 10/03/2018 1308   CL 107 07/13/2013 1542   CO2 28 10/03/2018 1308   CO2 26 07/13/2013 1542   GLUCOSE 157 (H) 10/03/2018 1308   GLUCOSE 106 (H) 07/13/2013 1542   BUN 16 10/03/2018 1308   BUN 11 07/13/2013 1542   CREATININE 0.80 10/03/2018 1308   CREATININE 0.76 10/15/2017 1001   CALCIUM 9.6 10/03/2018 1308   CALCIUM 9.9 07/13/2013 1542   PROT 6.3 (L) 10/03/2018 1308   PROT 8.1 07/13/2013 1542   ALBUMIN 3.7 10/03/2018 1308   ALBUMIN 4.3 07/13/2013 1542   AST 16 10/03/2018 1308   AST 26 07/13/2013 1542   ALT 10 10/03/2018 1308   ALT 32 07/13/2013  1542   ALKPHOS 42 10/03/2018 1308  ALKPHOS 74 07/13/2013 1542   BILITOT 0.3 10/03/2018 1308   BILITOT 0.4 07/13/2013 1542   GFRNONAA >60 10/03/2018 1308   GFRNONAA 95 10/15/2017 1001   GFRAA >60 10/03/2018 1308   GFRAA 110 10/15/2017 1001    No results found for: SPEP, UPEP  Lab Results  Component Value Date   WBC 5.5 10/03/2018   NEUTROABS 2.9 10/03/2018   HGB 12.1 (L) 10/03/2018   HCT 37.2 (L) 10/03/2018   MCV 96.4 10/03/2018   PLT 181 10/03/2018      Chemistry      Component Value Date/Time   NA 135 10/03/2018 1308   NA 140 07/13/2013 1542   K 3.5 10/03/2018 1308   K 4.3 07/13/2013 1542   CL 100 10/03/2018 1308   CL 107 07/13/2013 1542   CO2 28 10/03/2018 1308   CO2 26 07/13/2013 1542   BUN 16 10/03/2018 1308   BUN 11 07/13/2013 1542   CREATININE 0.80 10/03/2018 1308   CREATININE 0.76 10/15/2017 1001      Component Value Date/Time   CALCIUM 9.6 10/03/2018 1308   CALCIUM 9.9 07/13/2013 1542   ALKPHOS 42 10/03/2018 1308   ALKPHOS 74 07/13/2013 1542   AST 16 10/03/2018 1308   AST 26 07/13/2013 1542   ALT 10 10/03/2018 1308   ALT 32 07/13/2013 1542   BILITOT 0.3 10/03/2018 1308   BILITOT 0.4 07/13/2013 1542       RADIOGRAPHIC STUDIES: I have personally reviewed the radiological images as listed and agreed with the findings in the report. Nm Pet (axumin) Skull Base To Mid Thigh  Result Date: 10/17/2018 CLINICAL DATA:  Prostate carcinoma with biochemical recurrence. EXAM: NUCLEAR MEDICINE PET SKULL BASE TO THIGH TECHNIQUE: 10.3 mCi F-18 Fluciclovine was injected intravenously. Full-ring PET imaging was performed from the skull base to thigh after the radiotracer. CT data was obtained and used for attenuation correction and anatomic localization. COMPARISON:  PET-CT 01/23/2017 FINDINGS: NECK No radiotracer activity in neck lymph nodes. Incidental CT finding: None CHEST Within the LEFT suprahilar region intense focus radiotracer activity localizes to ill-defined  tissue on this noncontrast CT. Activity is intense activity SUV max equal 7.7 (image 229) Several small lymph nodes which accumulate radiotracer. For example small prevascular lymph node measuring 5 mm with SUV max equal 5.9. Similar RIGHT lower paratracheal lymph node similar radiotracer activity (SUV max equal 5.3). This multifocal mediastinal nodal activity is new from comparison exam. Incidental CT finding: 4 mm nodule in the RIGHT lower lobe (image 123/3 is unchanged no new pulmonary nodules. ABDOMEN/PELVIS Prostate: No focal activity in the prostate bed. Lymph nodes: No abnormal radiotracer accumulation within pelvic or abdominal nodes. Liver: No evidence of liver metastasis Incidental CT finding: None SKELETON Several new foci of radiotracer accumulation on the background of diffuse sclerotic skeletal metastasis. Example within the LEFT iliac bone SUV max equal 4.3 lesion. Lesion at the L 3 vertebral body with SUV max equal 8.9. No clear CT changes of other than the diffuse sclerotic lesions described previously. Additional foci in the thoracic spine. Lesion in the RIGHT scapula beneath the glenoid fossa with SUV max equal 6.1. Two lesions within the manubrium with SUV max equal 7.0 IMPRESSION: 1. Progression of prostate cancer metastasis. There are new LEFT suprahilar and small mediastinal lymph nodes with accumulate the prostate cancer specific radiotracer most consistent with mediastinal nodal metastasis. 2. New foci of radiotracer uptake on the background of diffuse skeletal metastasis. Discrete radiotracer avid lesions are present in the  iliac bones, lumbar spine, manubrium, and RIGHT scapula. Additional lesions are present. Electronically Signed   By: Suzy Bouchard M.D.   On: 10/17/2018 15:36     ASSESSMENT & PLAN:  Prostate cancer metastatic to bone ([place]) # Castrate resistant prostate cancer metastases to bone. Lupron every 3 months [09/07/2018]. On Apalutamide 240 mg/day-? Sep 2020; PSA- 190;  rising. PET aux- 10/15- Progressive bone/ Left suprahilar-mediastinal LN. recommend discontinuation of apalutamide when the progression of disease/poor tolerance  #Recommend Taxotere-for palliative reasons.  Given the patient extreme concern for side effects/reluctance with chemotherapy overall-reasonable to recommend Taxotere weekly.  Discussed regarding dexamethasone premedication/antiemetics  # Discussed the potential side effects including but not limited to-increasing fatigue, nausea vomiting, diarrhea, hair loss, sores in the mouth, increase risk of infection and also neuropathy.   # Depression/ anxiety-stable continue Remeron.  # Malignancy related pain- continue fentanyl patch hydrocodone;. Continue fentanyl patch to every 48 hours and continue hydrocodone. Overall stable; follow up with Josh.   # I spoke at length with the patient's wife, Jenny Reichmann- regarding the patient's clinical status/plan of care.  Family agreement.   # DISPOSITION:  # chemo education- weekly taxotere # covid test prior chemo- # Follow-up visit in 2 weeks-X-MD; cbc/cmp/psa- Taxotere weekly-DrB   # josh in 2 weeks--Dr.B  # 40 minutes face-to-face with the patient discussing the above plan of care; more than 50% of time spent on prognosis/ natural history; counseling and coordination.    Orders Placed This Encounter  Procedures  . Novel Coronavirus, NAA ([facility]) Drive up testing site only    Standing Status:   Future    Standing Expiration Date:   10/18/2019    Order Specific Question:   Is this test for diagnosis or screening    Answer:   Screening    Order Specific Question:   Symptomatic for COVID-19 as defined by CDC    Answer:   No    Order Specific Question:   Hospitalized for COVID-19    Answer:   No    Order Specific Question:   Admitted to ICU for COVID-19    Answer:   No    Order Specific Question:   Previously tested for COVID-19    Answer:   No    Order Specific Question:   Resident in a  congregate (group) care setting    Answer:   No    Order Specific Question:   Is the patient student?    Answer:   No    Order Specific Question:   Employed in healthcare setting    Answer:   No  . CBC with Differential    Standing Status:   Future    Standing Expiration Date:   10/18/2019  . Comprehensive metabolic panel    Standing Status:   Future    Standing Expiration Date:   10/18/2019  . PSA    Standing Status:   Future    Standing Expiration Date:   10/18/2019   All questions were answered. The patient knows to call the clinic with any problems, questions or concerns.      [name], MD 10/19/2018 6:01 AM

## 2018-10-22 ENCOUNTER — Encounter: Payer: Self-pay | Admitting: Internal Medicine

## 2018-10-23 NOTE — Patient Instructions (Signed)
Docetaxel injection What is this medicine? DOCETAXEL (doe se TAX el) is a chemotherapy drug. It targets fast dividing cells, like cancer cells, and causes these cells to die. This medicine is used to treat many types of cancers like breast cancer, certain stomach cancers, head and neck cancer, lung cancer, and prostate cancer. This medicine may be used for other purposes; ask your health care provider or pharmacist if you have questions. COMMON BRAND NAME(S): Docefrez, Taxotere What should I tell my health care provider before I take this medicine? They need to know if you have any of these conditions:  infection (especially a virus infection such as chickenpox, cold sores, or herpes)  liver disease  low blood counts, like low white cell, platelet, or red cell counts  an unusual or allergic reaction to docetaxel, polysorbate 80, other chemotherapy agents, other medicines, foods, dyes, or preservatives  pregnant or trying to get pregnant  breast-feeding How should I use this medicine? This drug is given as an infusion into a vein. It is administered in a hospital or clinic by a specially trained health care professional. Talk to your pediatrician regarding the use of this medicine in children. Special care may be needed. Overdosage: If you think you have taken too much of this medicine contact a poison control center or emergency room at once. NOTE: This medicine is only for you. Do not share this medicine with others. What if I miss a dose? It is important not to miss your dose. Call your doctor or health care professional if you are unable to keep an appointment. What may interact with this medicine?  aprepitant  certain antibiotics like erythromycin or clarithromycin  certain antivirals for HIV or hepatitis  certain medicines for fungal infections like fluconazole, itraconazole, ketoconazole, posaconazole, or  voriconazole  cimetidine  ciprofloxacin  conivaptan  cyclosporine  dronedarone  fluvoxamine  grapefruit juice  imatinib  verapamil This list may not describe all possible interactions. Give your health care provider a list of all the medicines, herbs, non-prescription drugs, or dietary supplements you use. Also tell them if you smoke, drink alcohol, or use illegal drugs. Some items may interact with your medicine. What should I watch for while using this medicine? Your condition will be monitored carefully while you are receiving this medicine. You will need important blood work done while you are taking this medicine. Call your doctor or health care professional for advice if you get a fever, chills or sore throat, or other symptoms of a cold or flu. Do not treat yourself. This drug decreases your body's ability to fight infections. Try to avoid being around people who are sick. Some products may contain alcohol. Ask your health care professional if this medicine contains alcohol. Be sure to tell all health care professionals you are taking this medicine. Certain medicines, like metronidazole and disulfiram, can cause an unpleasant reaction when taken with alcohol. The reaction includes flushing, headache, nausea, vomiting, sweating, and increased thirst. The reaction can last from 30 minutes to several hours. You may get drowsy or dizzy. Do not drive, use machinery, or do anything that needs mental alertness until you know how this medicine affects you. Do not stand or sit up quickly, especially if you are an older patient. This reduces the risk of dizzy or fainting spells. Alcohol may interfere with the effect of this medicine. Talk to your health care professional about your risk of cancer. You may be more at risk for certain types of cancer if   you take this medicine. Do not become pregnant while taking this medicine or for 6 months after stopping it. Women should inform their doctor if  they wish to become pregnant or think they might be pregnant. There is a potential for serious side effects to an unborn child. Talk to your health care professional or pharmacist for more information. Do not breast-feed an infant while taking this medicine or for 1 to 2 weeks after stopping it. Males who get this medicine must use a condom during sex with females who can get pregnant. If you get a woman pregnant, the baby could have birth defects. The baby could die before they are born. You will need to continue wearing a condom for 3 months after stopping the medicine. Tell your health care provider right away if your partner becomes pregnant while you are taking this medicine. This may interfere with the ability to father a child. You should talk to your doctor or health care professional if you are concerned about your fertility. What side effects may I notice from receiving this medicine? Side effects that you should report to your doctor or health care professional as soon as possible:  allergic reactions like skin rash, itching or hives, swelling of the face, lips, or tongue  blurred vision  breathing problems  changes in vision  low blood counts - This drug may decrease the number of white blood cells, red blood cells and platelets. You may be at increased risk for infections and bleeding.  nausea and vomiting  pain, redness or irritation at site where injected  pain, tingling, numbness in the hands or feet  redness, blistering, peeling, or loosening of the skin, including inside the mouth  signs of decreased platelets or bleeding - bruising, pinpoint red spots on the skin, black, tarry stools, nosebleeds  signs of decreased red blood cells - unusually weak or tired, fainting spells, lightheadedness  signs of infection - fever or chills, cough, sore throat, pain or difficulty passing urine  swelling of the ankle, feet, hands Side effects that usually do not require medical  attention (report to your doctor or health care professional if they continue or are bothersome):  constipation  diarrhea  fingernail or toenail changes  hair loss  loss of appetite  mouth sores  muscle pain This list may not describe all possible side effects. Call your doctor for medical advice about side effects. You may report side effects to FDA at 1-800-FDA-1088. Where should I keep my medicine? This drug is given in a hospital or clinic and will not be stored at home. NOTE: This sheet is a summary. It may not cover all possible information. If you have questions about this medicine, talk to your doctor, pharmacist, or health care provider.  2020 Elsevier/Gold Standard (2018-02-22 12:23:11)  

## 2018-10-24 ENCOUNTER — Inpatient Hospital Stay: Payer: 59 | Admitting: Nurse Practitioner

## 2018-10-24 ENCOUNTER — Inpatient Hospital Stay: Payer: 59

## 2018-10-24 ENCOUNTER — Other Ambulatory Visit: Payer: Self-pay

## 2018-10-24 ENCOUNTER — Other Ambulatory Visit
Admission: RE | Admit: 2018-10-24 | Discharge: 2018-10-24 | Disposition: A | Discharge status: Home / Self Care | Payer: 59 | Source: Ambulatory Visit | Attending: Oncology | Admitting: Oncology

## 2018-10-24 DIAGNOSIS — Z01812 Encounter for preprocedural laboratory examination: Secondary | ICD-10-CM | POA: Diagnosis not present

## 2018-10-24 DIAGNOSIS — Z20828 Contact with and (suspected) exposure to other viral communicable diseases: Secondary | ICD-10-CM | POA: Diagnosis not present

## 2018-10-24 LAB — SARS CORONAVIRUS 2 (TAT 6-24 HRS): SARS Coronavirus 2: NEGATIVE

## 2018-10-31 ENCOUNTER — Encounter: Payer: Self-pay | Admitting: Oncology

## 2018-10-31 ENCOUNTER — Other Ambulatory Visit: Payer: Self-pay

## 2018-10-31 NOTE — Progress Notes (Signed)
Patient stated that he had been doing well with no complaints. 

## 2018-11-01 ENCOUNTER — Other Ambulatory Visit: Payer: Self-pay

## 2018-11-01 ENCOUNTER — Inpatient Hospital Stay: Payer: 59

## 2018-11-01 ENCOUNTER — Inpatient Hospital Stay (HOSPITAL_BASED_OUTPATIENT_CLINIC_OR_DEPARTMENT_OTHER): Payer: 59 | Admitting: Oncology

## 2018-11-01 ENCOUNTER — Inpatient Hospital Stay (HOSPITAL_BASED_OUTPATIENT_CLINIC_OR_DEPARTMENT_OTHER): Payer: 59 | Admitting: Hospice and Palliative Medicine

## 2018-11-01 VITALS — BP 167/84 | HR 59 | Temp 96.0°F | Resp 17

## 2018-11-01 VITALS — BP 137/75 | HR 80 | Temp 97.3°F | Ht 71.0 in | Wt 163.0 lb

## 2018-11-01 DIAGNOSIS — K5903 Drug induced constipation: Secondary | ICD-10-CM | POA: Diagnosis not present

## 2018-11-01 DIAGNOSIS — Z5111 Encounter for antineoplastic chemotherapy: Secondary | ICD-10-CM

## 2018-11-01 DIAGNOSIS — C61 Malignant neoplasm of prostate: Secondary | ICD-10-CM

## 2018-11-01 DIAGNOSIS — C7951 Secondary malignant neoplasm of bone: Secondary | ICD-10-CM

## 2018-11-01 DIAGNOSIS — G893 Neoplasm related pain (acute) (chronic): Secondary | ICD-10-CM | POA: Diagnosis not present

## 2018-11-01 DIAGNOSIS — T451X5A Adverse effect of antineoplastic and immunosuppressive drugs, initial encounter: Secondary | ICD-10-CM | POA: Diagnosis not present

## 2018-11-01 DIAGNOSIS — Z7189 Other specified counseling: Secondary | ICD-10-CM

## 2018-11-01 DIAGNOSIS — Z79899 Other long term (current) drug therapy: Secondary | ICD-10-CM | POA: Diagnosis not present

## 2018-11-01 DIAGNOSIS — F17210 Nicotine dependence, cigarettes, uncomplicated: Secondary | ICD-10-CM | POA: Diagnosis not present

## 2018-11-01 DIAGNOSIS — G629 Polyneuropathy, unspecified: Secondary | ICD-10-CM | POA: Diagnosis not present

## 2018-11-01 LAB — COMPREHENSIVE METABOLIC PANEL
ALT: 11 U/L (ref 0–44)
AST: 18 U/L (ref 15–41)
Albumin: 3.9 g/dL (ref 3.5–5.0)
Alkaline Phosphatase: 47 U/L (ref 38–126)
Anion gap: 10 (ref 5–15)
BUN: 14 mg/dL (ref 8–23)
CO2: 25 mmol/L (ref 22–32)
Calcium: 9.8 mg/dL (ref 8.9–10.3)
Chloride: 102 mmol/L (ref 98–111)
Creatinine, Ser: 0.77 mg/dL (ref 0.61–1.24)
GFR calc Af Amer: >60 mL/min (ref >60)
GFR calc non Af Amer: >60 mL/min (ref >60)
Glucose, Bld: 187 mg/dL — ABNORMAL HIGH (ref 70–99)
Potassium: 4.3 mmol/L (ref 3.5–5.1)
Sodium: 137 mmol/L (ref 135–145)
Total Bilirubin: 0.3 mg/dL (ref 0.3–1.2)
Total Protein: 6.7 g/dL (ref 6.5–8.1)

## 2018-11-01 LAB — CBC WITH DIFFERENTIAL/PLATELET
Abs Immature Granulocytes: 0.03 10*3/uL (ref 0.00–0.07)
Basophils Absolute: 0 10*3/uL (ref 0.0–0.1)
Basophils Relative: 0 %
Eosinophils Absolute: 0 10*3/uL (ref 0.0–0.5)
Eosinophils Relative: 0 %
HCT: 35.4 % — ABNORMAL LOW (ref 39.0–52.0)
Hemoglobin: 11.6 g/dL — ABNORMAL LOW (ref 13.0–17.0)
Immature Granulocytes: 0 %
Lymphocytes Relative: 14 %
Lymphs Abs: 1.1 10*3/uL (ref 0.7–4.0)
MCH: 31.3 pg (ref 26.0–34.0)
MCHC: 32.8 g/dL (ref 30.0–36.0)
MCV: 95.4 fL (ref 80.0–100.0)
Monocytes Absolute: 0.5 10*3/uL (ref 0.1–1.0)
Monocytes Relative: 7 %
Neutro Abs: 6.3 10*3/uL (ref 1.7–7.7)
Neutrophils Relative %: 79 %
Platelets: 161 10*3/uL (ref 150–400)
RBC: 3.71 MIL/uL — ABNORMAL LOW (ref 4.22–5.81)
RDW: 13.1 % (ref 11.5–15.5)
WBC: 8 10*3/uL (ref 4.0–10.5)
nRBC: 0 % (ref 0.0–0.2)

## 2018-11-01 LAB — PSA: Prostatic Specific Antigen: 203 ng/mL — ABNORMAL HIGH (ref 0.00–4.00)

## 2018-11-01 MED ORDER — SODIUM CHLORIDE 0.9 % IV SOLN: 10.0000 mg | Freq: Once | INTRAVENOUS | Status: DC

## 2018-11-01 MED ORDER — SODIUM CHLORIDE 0.9 % IV SOLN
Freq: Once | INTRAVENOUS | Status: AC
  Administered 2018-11-01: 10:00:00 via INTRAVENOUS
  Filled 2018-11-01: qty 250

## 2018-11-01 MED ORDER — SODIUM CHLORIDE 0.9 % IV SOLN
36.0000 mg/m2 | Freq: Once | INTRAVENOUS | Status: DC
  Filled 2018-11-01: qty 7

## 2018-11-01 MED ORDER — SODIUM CHLORIDE 0.9 % IV SOLN
36.0000 mg/m2 | Freq: Once | INTRAVENOUS | Status: AC
  Administered 2018-11-01: 70 mg via INTRAVENOUS
  Filled 2018-11-01: qty 7

## 2018-11-01 MED ORDER — DEXAMETHASONE SODIUM PHOSPHATE 10 MG/ML IJ SOLN
10.0000 mg | Freq: Once | INTRAMUSCULAR | Status: AC
  Administered 2018-11-01: 10 mg via INTRAVENOUS
  Filled 2018-11-01: qty 1

## 2018-11-01 MED ORDER — TRAZODONE HCL 50 MG PO TABS: 50.0000 mg | ORAL_TABLET | Freq: Every evening | ORAL | 2 refills | Status: DC | PRN

## 2018-11-01 MED ORDER — OXYCODONE HCL 15 MG PO TABS: 15.0000 mg | ORAL_TABLET | ORAL | 0 refills | Status: DC | PRN

## 2018-11-01 NOTE — Progress Notes (Signed)
[place]  Telephone:([phone] [phone]   Name: Robert Short Date: 11/01/2018 MRN: [id number]  DOB: 10-31-1950  Patient Care Team: Olin Hauser, [name], [name], [name]    REASON FOR CONSULTATION: Palliative Care consult requested for this 68 y.o. male with multiple medical problems including stage IV prostate cancer metastatic to bone status post Xtandi.  Patient has had multiple symptoms including fatigue and pain.  He was referred to palliative care to help with symptom management and to address goals.  SOCIAL HISTORY:     reports that he has been smoking cigarettes. He has a 20.00 pack-year smoking history. His smokeless tobacco use includes chew. He reports current alcohol use. He reports that he does not use drugs.   Patient is married and lives at home with his wife. He has five children. Patient used to work as a Pharmacist, hospital, Administrator, and Engineer, production.  ADVANCE DIRECTIVES:  Does not have  CODE STATUS:   PAST MEDICAL HISTORY: Past Medical History:  Diagnosis Date   Back pain 10/09/2012   Bone cancer ([place])    CAD (coronary artery disease)    a. 08/2015 PCI: LM nl, LAD 20d, LCX nl, RCA 38m RPAV 95 (3.0x18 Xience Alpine DES);  b. [place] RPL 95 (2.75x8 Promus Premier DES).   Cancer associated pain    Depression    History of echocardiogram    a. 08/2016 Echo: EF 50-55%.   History of kidney stones    Hyperlipidemia    Hypertension    Joint pain    [place]    a.  s/p prostatectomy ([facility]);  b. bone mets noted 04/2016.   Right arm pain 01/10/2016   Right foot pain 01/10/2016   Right leg pain 01/10/2016   Tobacco abuse     PAST SURGICAL HISTORY:  Past Surgical History:  Procedure Laterality Date    CARDIAC CATHETERIZATION     [facility]   CARDIAC CATHETERIZATION N/A 08/16/2015   Procedure: Left Heart Cath and Coronary Angiography;  Surgeon: DYolonda Kida MD;  [facility];  Service: Cardiovascular;  Laterality: N/A;   CARDIAC CATHETERIZATION N/A 08/16/2015   Procedure: Coronary Stent Intervention;  Surgeon: DYolonda Kida MD;  [facility];  Service: Cardiovascular;  Laterality: N/A;   CERVIX SURGERY     PROSTATECTOMY     SPINE SURGERY     TONSILLECTOMY     WRIST SURGERY      HEMATOLOGY/ONCOLOGY HISTORY:  Oncology History Overview Note  # 2011- PROSTATE CANCER [Gleason 3+4]; s/p Prostatectomy [ also involved bladder neck/ECP; Dr.Polaseck]; July 2014- Biochemical recurrence [PSA 14]- started on Zoladex [Dr.Pandit]; lost to follow up.  # JAN 2017- STAGE IV METASTATIC PROSTATE Cancer to Bone- Feb 13th, 2017-  Lupron q 364m~end of feb]; PSA: 1021; Declined Chemo; April 2017 [xofigo x6; Dr.Crystal]; AUG 2017- Zytiga + Prednisone BID. Bone scan-Jan 2018- improved skeletal metastases.   # MAY 1st 2019- START X-tandi [stopped zytiga/PSA- 8.8/rising]  #August 23, 2018-stop Xtandi [intolerance-fatigue mental fogginess]  #Mid September 2020-apalutamide; stopped mid-October poor tolerance/progression  # Late Oct-Taxotere weekly  # Mets to bone- start X-geva q 60M [May 30th]; hold dental issues/infections  # Smoker/ chronic pain/pain clinic   #[April 2018; [place]]- s/p stenting; on Plavix; --------------------------------------------------------------  DIAGNOSIS: '[ ]'  Castrate  resistant prostate cancer  STAGE:   4    ;GOALS: Palliative  CURRENT/MOST RECENT THERAPY-Late Oct 2020- Taxotere weekly    Prostate cancer metastatic to bone ([place])  12/08/2014 Initial Diagnosis   Prostate cancer metastatic to bone ([place])   11/01/2018 -  Chemotherapy   The patient had DOCEtaxel (TAXOTERE) 70 mg in sodium chloride 0.9 % 150 mL chemo infusion, 36  mg/m2 = 70 mg, Intravenous,  Once, 0 of 6 cycles  for chemotherapy treatment.      ALLERGIES:  is allergic to ditropan [oxybutynin].  MEDICATIONS:  Current Outpatient Medications  Medication Sig Dispense Refill   acetaminophen (TYLENOL) 500 MG tablet Take 1,000 mg by mouth every 8 (eight) hours as needed.      albuterol (PROVENTIL HFA;VENTOLIN HFA) 108 (90 Base) MCG/ACT inhaler Inhale 1-2 puffs into the lungs every 6 (six) hours as needed for wheezing or shortness of breath. 1 Inhaler 0   apalutamide (ERLEADA) 60 MG tablet Take 4 tablets (240 mg total) by mouth daily. (Patient not taking: Reported on 10/31/2018) 120 tablet 6   aspirin 81 MG tablet Take 81 mg by mouth daily.     atorvastatin (LIPITOR) 80 MG tablet TAKE 1 TABLET BY MOUTH DAILY AT 6 PM. 90 tablet 3   chlorhexidine (PERIDEX) 0.12 % solution      clopidogrel (PLAVIX) 75 MG tablet TAKE 1 TABLET BY MOUTH DAILY 90 tablet 0   dexamethasone (DECADRON) 4 MG tablet Take one pill AM & PM x 3 days; start the day prior to chemo. 60 tablet 1   fentaNYL (DURAGESIC) 100 MCG/HR Place 1 patch onto the skin every other day. Along with 87mg patch for total of 125 mcg every 2 days. 15 patch 0   fentaNYL (DURAGESIC) 25 MCG/HR Place 1 patch onto the skin every other day. Use this along with fentanyl patch 100 mcg [total 125 mcg] every 2 days 15 patch 0   gabapentin (NEURONTIN) 100 MG capsule START 1 CAPSULE DAILY, INCREASE BY 1 CAP EVERY 2 TO 3 DAYS AS TOLERATED UP TO 3 TIMES A DAY, OR MAY TAKE 3 AT [place] capsule 5   metoprolol tartrate (LOPRESSOR) 25 MG tablet TAKE 1/2 TABLET BY MOUTH 2 TIMES DAILY. 90 tablet 1   mirtazapine (REMERON) 30 MG tablet Take 1 tablet (30 mg total) by mouth at bedtime. 30 tablet 1   naloxone (NARCAN) nasal spray 4 mg/0.1 mL 1 spray into nostril upon signs of opioid overdose. Call 911. May repeat once if no response within 2-3 minutes. 1 kit 0   nitroGLYCERIN (NITROSTAT) 0.4 MG SL tablet Place  0.4 mg under the tongue every 5 (five) minutes as needed.      ondansetron (ZOFRAN) 8 MG tablet One pill every 8 hours as needed for nausea/vomitting. (Patient not taking: Reported on 10/31/2018) 40 tablet 1   Oxycodone HCl 10 MG TABS Take 1 tablet (10 mg total) by mouth every 4 (four) hours as needed. 60 tablet 0   polyethylene glycol (MIRALAX / GLYCOLAX) packet Take 17 g by mouth daily as needed for moderate constipation.     predniSONE (DELTASONE) 5 MG tablet TAKE 1 TABLET BY MOUTH DAILY WITH BREAKFAST. 30 tablet 1   promethazine (PHENERGAN) 25 MG tablet TAKE 1/2 TO 1 TABLET (12.5-25 MG TOTAL) BY MOUTH EVERY 8 (EIGHT) HOURS AS NEEDED FOR NAUSEA OR VOMITING. 30 tablet 3   traZODone (DESYREL) 50 MG tablet      Wheat Dextrin (BENEFIBER) POWD Stir 2  tsp. TID into 4-8 oz of any non-carbonated beverage or soft food (hot or cold) 500 g PRN   No current facility-administered medications for this visit.     VITAL SIGNS: There were no vitals taken for this visit. There were no vitals filed for this visit.  Estimated body mass index is 22.73 kg/m as calculated from the following:   Height as of an earlier encounter on 11/01/18: '5\' 11"'  (1.803 m).   Weight as of an earlier encounter on 11/01/18: 163 lb (73.9 kg).  LABS: CBC:    Component Value Date/Time   WBC 8.0 11/01/2018 0845   HGB 11.6 (L) 11/01/2018 0845   HGB 14.4 11/03/2013 1619   HCT 35.4 (L) 11/01/2018 0845   HCT 44.2 11/03/2013 1619   PLT 161 11/01/2018 0845   PLT 149 (L) 11/03/2013 1619   MCV 95.4 11/01/2018 0845   MCV 96 11/03/2013 1619   NEUTROABS 6.3 11/01/2018 0845   NEUTROABS 10.7 (H) 11/03/2013 1619   LYMPHSABS 1.1 11/01/2018 0845   LYMPHSABS 2.0 11/03/2013 1619   MONOABS 0.5 11/01/2018 0845   MONOABS 1.3 (H) 11/03/2013 1619   EOSABS 0.0 11/01/2018 0845   EOSABS 0.1 11/03/2013 1619   BASOSABS 0.0 11/01/2018 0845   BASOSABS 0.1 11/03/2013 1619   Comprehensive Metabolic Panel:    Component Value Date/Time    NA 137 11/01/2018 0845   NA 140 07/13/2013 1542   K 4.3 11/01/2018 0845   K 4.3 07/13/2013 1542   CL 102 11/01/2018 0845   CL 107 07/13/2013 1542   CO2 25 11/01/2018 0845   CO2 26 07/13/2013 1542   BUN 14 11/01/2018 0845   BUN 11 07/13/2013 1542   CREATININE 0.77 11/01/2018 0845   CREATININE 0.76 10/15/2017 1001   GLUCOSE 187 (H) 11/01/2018 0845   GLUCOSE 106 (H) 07/13/2013 1542   CALCIUM 9.8 11/01/2018 0845   CALCIUM 9.9 07/13/2013 1542   AST 18 11/01/2018 0845   AST 26 07/13/2013 1542   ALT 11 11/01/2018 0845   ALT 32 07/13/2013 1542   ALKPHOS 47 11/01/2018 0845   ALKPHOS 74 07/13/2013 1542   BILITOT 0.3 11/01/2018 0845   BILITOT 0.4 07/13/2013 1542   PROT 6.7 11/01/2018 0845   PROT 8.1 07/13/2013 1542   ALBUMIN 3.9 11/01/2018 0845   ALBUMIN 4.3 07/13/2013 1542    RADIOGRAPHIC STUDIES: Nm Pet (axumin) Skull Base To Mid Thigh  Result Date: 10/17/2018 CLINICAL DATA:  Prostate carcinoma with biochemical recurrence. EXAM: NUCLEAR MEDICINE PET SKULL BASE TO THIGH TECHNIQUE: 10.3 mCi F-18 Fluciclovine was injected intravenously. Full-ring PET imaging was performed from the skull base to thigh after the radiotracer. CT data was obtained and used for attenuation correction and anatomic localization. COMPARISON:  PET-CT 01/23/2017 FINDINGS: NECK No radiotracer activity in neck lymph nodes. Incidental CT finding: None CHEST Within the LEFT suprahilar region intense focus radiotracer activity localizes to ill-defined tissue on this noncontrast CT. Activity is intense activity SUV max equal 7.7 (image 229) Several small lymph nodes which accumulate radiotracer. For example small prevascular lymph node measuring 5 mm with SUV max equal 5.9. Similar RIGHT lower paratracheal lymph node similar radiotracer activity (SUV max equal 5.3). This multifocal mediastinal nodal activity is new from comparison exam. Incidental CT finding: 4 mm nodule in the RIGHT lower lobe (image 123/3 is unchanged no new  pulmonary nodules. ABDOMEN/PELVIS Prostate: No focal activity in the prostate bed. Lymph nodes: No abnormal radiotracer accumulation within pelvic or abdominal nodes. Liver: No evidence of  liver metastasis Incidental CT finding: None SKELETON Several new foci of radiotracer accumulation on the background of diffuse sclerotic skeletal metastasis. Example within the LEFT iliac bone SUV max equal 4.3 lesion. Lesion at the L 3 vertebral body with SUV max equal 8.9. No clear CT changes of other than the diffuse sclerotic lesions described previously. Additional foci in the thoracic spine. Lesion in the RIGHT scapula beneath the glenoid fossa with SUV max equal 6.1. Two lesions within the manubrium with SUV max equal 7.0 IMPRESSION: 1. Progression of prostate cancer metastasis. There are new LEFT suprahilar and small mediastinal lymph nodes with accumulate the prostate cancer specific radiotracer most consistent with mediastinal nodal metastasis. 2. New foci of radiotracer uptake on the background of diffuse skeletal metastasis. Discrete radiotracer avid lesions are present in the iliac bones, lumbar spine, manubrium, and RIGHT scapula. Additional lesions are present. Electronically Signed   By: Suzy Bouchard M.D.   On: 10/17/2018 15:36    PERFORMANCE STATUS (ECOG) : 2 - Symptomatic, <50% confined to bed  Review of Systems Unless otherwise noted, a complete review of systems is negative.  Physical Exam General: NAD, frail appearing, thin Pulmonary: Unlabored Extremities: no edema, no joint deformities Skin: no rashes Neurological: Weakness but otherwise nonfocal  IMPRESSION: Routine follow-up visit today.  Patient presents with his wife.  He continues to endorse persistent abdominal pain.  He finds pain is tolerable and wife suggests that she thinks the pain is slightly improved on oxycodone.  However, patient seems to be taking 1 to 2 tablets of oxycodone when pain is severe.  Will increase oxycodone  dose to 50 mg every 4 hours.  We discussed constipation management.  Patient has not had a bowel movement in several days but cannot recall exactly when.  He is having a lower abdominal crampy pain, which is most likely secondary to constipation.  He says that he does not regularly take his bowel regimen as we have previously discussed.  I suggested that his wife add daily Senokot to his pillbox.  PDMP reviewed  PLAN: -Continue current scope of treatment -Increase oxycodone to 15 mg every 4 hours as needed for  breakthrough pain (#90) -Trazodone 50 to 100 mg nightly for sleep (#60) -Consider stopping mirtazapine and starting an SSRI, which may be more energizing -We will need to establish ACP -Follow-up telephone visit in 1 month   Patient expressed understanding and was in agreement with this plan. He also understands that He can call the clinic at any time with any questions, concerns, or complaints.     Time Total: 20 minutes  Visit consisted of counseling and education dealing with the complex and emotionally intense issues of symptom management and palliative care in the setting of serious and potentially life-threatening illness.Greater than 50%  of this time was spent counseling and coordinating care related to the above assessment and plan.  Signed by: Altha Harm, PhD, NP-C [phone] (Work Cell)

## 2018-11-01 NOTE — Progress Notes (Signed)
Pt tolerated infusion well. Pt denies any concerns or complaints at this time. Pt and VS stable at discharge.  

## 2018-11-04 ENCOUNTER — Telehealth: Payer: Self-pay

## 2018-11-04 NOTE — Progress Notes (Signed)
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  # JAN 2017- STAGE IV METASTATIC PROSTATE Cancer to Bone- Feb 13th, 2017-  Lupron q 72m[~end of feb]; PSA: 1021; Declined Chemo; April 2017 [xofigo x6; Dr.Crystal]; AUG 2017- Zytiga + Prednisone BID. Bone scan-Jan 2018- improved skeletal metastases.   # MAY 1st 2019- START X-tandi [stopped zytiga/PSA- 8.8/rising]  #August 23, 2018-stop Xtandi [intolerance-fatigue mental fogginess]  #Mid September 2020-apalutamide; stopped mid-October poor tolerance/progression  # Late Oct-Taxotere weekly  # Mets to bone- start X-geva q 31M [May 30th]; hold dental issues/infections  # Smoker/ chronic pain/pain clinic   #[April 2018; [place]]- s/p stenting; on Plavix; --------------------------------------------------------------  DIAGNOSIS: _0  Castrate resistant prostate cancer  STAGE:   4    ;GOALS: Palliative  CURRENT/MOST RECENT THERAPY-Late Oct 2020- Taxotere weekly    Prostate cancer metastatic to bone ([place]  12/08/2014 Initial Diagnosis   Prostate cancer metastatic to bone ([place]   11/01/2018 -  Chemotherapy   The patient had DOCEtaxel (TAXOTERE)  70 mg in sodium chloride 0.9 % 150 mL chemo infusion, 36 mg/m2 = 70 mg, Intravenous,  Once, 1 of 6 cycles  for chemotherapy treatment.       Interval history-patient feels fatigued and does have generalized pain in his joints.  Constipation is relatively well controlled  ECOG PS- 1 Pain scale- 4 Opioid associated constipation- no  Review of systems- Review of Systems  Constitutional: Positive for malaise/fatigue. Negative for chills, fever and weight loss.  HENT: Negative for congestion, ear discharge and nosebleeds.   Eyes: Negative for blurred vision.  Respiratory: Negative for cough, hemoptysis, sputum production, shortness of breath and wheezing.   Cardiovascular: Negative for chest pain, palpitations, orthopnea and claudication.  Gastrointestinal: Positive for constipation. Negative for abdominal pain, blood in stool, diarrhea, heartburn, melena, nausea and vomiting.  Genitourinary: Negative for dysuria, flank pain, frequency, hematuria and urgency.  Musculoskeletal: Positive for back pain and joint pain. Negative for myalgias.  Skin: Negative for rash.  Neurological: Negative for dizziness, tingling, focal weakness, seizures, weakness and headaches.  Endo/Heme/Allergies: Does not bruise/bleed easily.  Psychiatric/Behavioral: Negative for depression and suicidal ideas. The patient does not have insomnia.       Allergies  Allergen Reactions  . Ditropan [Oxybutynin] Other (See Comments)    Hypotension and muscle weakness     Past Medical History:  Diagnosis Date  . Back pain 10/09/2012  . Bone cancer ([facility]   . CAD (coronary artery disease)    a. 08/2015 PCI: LM nl, LAD 20d, LCX nl, RCA 275mRPAV 95 (3.0x18 Xience Alpine DES);  b. [facility] (2.75x8 Promus Premier DES).  . Cancer associated pain   . Depression   . History of echocardiogram    a. 08/2016 Echo: EF 50-55%.  .Marland Kitchen  History of kidney stones   . Hyperlipidemia   . Hypertension   . Joint pain    . Prostate cancer [facility])    a.  s/p prostatectomy ([facility]);  b. bone mets noted 04/2016.  . Right arm pain 01/10/2016  . Right foot pain 01/10/2016  . Right leg pain 01/10/2016  . Tobacco abuse      Past Surgical History:  Procedure Laterality Date  . CARDIAC CATHETERIZATION     [facility]  . CARDIAC CATHETERIZATION N/A 08/16/2015   Procedure: Left Heart Cath and Coronary Angiography;  Surgeon: Yolonda Kida, MD;  Location: [facility];  Service: Cardiovascular;  Laterality: N/A;  . CARDIAC CATHETERIZATION N/A 08/16/2015   Procedure: Coronary Stent Intervention;  Surgeon: Yolonda Kida, MD;  Location: [facility];  Service: Cardiovascular;  Laterality: N/A;  . CERVIX SURGERY    . PROSTATECTOMY    . SPINE SURGERY    . TONSILLECTOMY    . WRIST SURGERY      Social History   Socioeconomic History  . Marital status: Married    Spouse name: Not on file  . Number of children: Not on file  . Years of education: Not on file  . Highest education level: Some college, no degree  Occupational History  . Occupation: Disabled    Comment: since 1999 - chronic back pain and DDD.  Social Needs  . Financial resource strain: Not hard at all  . Food insecurity    Worry: Never true    Inability: Never true  . Transportation needs    Medical: No    Non-medical: No  Tobacco Use  . Smoking status: Current Every Day Smoker    Packs/day: 0.50    Years: 40.00    Pack years: 20.00    Types: Cigarettes  . Smokeless tobacco: Current User    Types: Chew  . Tobacco comment: 1 pack every couple days   Substance and Sexual Activity  . Alcohol use: Yes    Frequency: Never    Comment: occasionally  . Drug use: No  . Sexual activity: Not on file  Lifestyle  . Physical activity    Days per week: 0 days    Minutes per session: 0 min  . Stress: Not at all  Relationships  . Social Herbalist on phone: Once a week    Gets together: Once a week    Attends religious  service: More than 4 times per year    Active member of club or organization: No    Attends meetings of clubs or organizations: Never    Relationship status: Married  . Intimate partner violence    Fear of current or ex partner: No    Emotionally abused: No    Physically abused: No    Forced sexual activity: No  Other Topics Concern  . Not on file  Social History Narrative   Lives in [place] with wife.  Does not routinely exercise.    Family History  Problem Relation Age of Onset  . Heart attack Mother   . Hypertension Mother   . Heart attack Father      Current Outpatient Medications:  .  acetaminophen (TYLENOL) 500 MG tablet, Take 1,000 mg by mouth every 8 (eight) hours as needed. , Disp: , Rfl:  .  albuterol (PROVENTIL HFA;VENTOLIN HFA) 108 (90 Base) MCG/ACT inhaler, Inhale 1-2 puffs into the lungs every 6 (six) hours as needed for wheezing or shortness  of breath., Disp: 1 Inhaler, Rfl: 0 .  aspirin 81 MG tablet, Take 81 mg by mouth daily., Disp: , Rfl:  .  atorvastatin (LIPITOR) 80 MG tablet, TAKE 1 TABLET BY MOUTH DAILY AT 6 PM., Disp: 90 tablet, Rfl: 3 .  chlorhexidine (PERIDEX) 0.12 % solution, , Disp: , Rfl:  .  clopidogrel (PLAVIX) 75 MG tablet, TAKE 1 TABLET BY MOUTH DAILY, Disp: 90 tablet, Rfl: 0 .  dexamethasone (DECADRON) 4 MG tablet, Take one pill AM & PM x 3 days; start the day prior to chemo., Disp: 60 tablet, Rfl: 1 .  fentaNYL (DURAGESIC) 100 MCG/HR, Place 1 patch onto the skin every other day. Along with 71mg patch for total of 125 mcg every 2 days., Disp: 15 patch, Rfl: 0 .  fentaNYL (DURAGESIC) 25 MCG/HR, Place 1 patch onto the skin every other day. Use this along with fentanyl patch 100 mcg [total 125 mcg] every 2 days, Disp: 15 patch, Rfl: 0 .  gabapentin (NEURONTIN) 100 MG capsule, START 1 CAPSULE DAILY, INCREASE BY 1 CAP EVERY 2 TO 3 DAYS AS TOLERATED UP TO 3 TIMES A DAY, OR MAY TAKE 3 AT ONCE IN EVENING., Disp: 90 capsule, Rfl: 5 .  metoprolol tartrate  (LOPRESSOR) 25 MG tablet, TAKE 1/2 TABLET BY MOUTH 2 TIMES DAILY., Disp: 90 tablet, Rfl: 1 .  mirtazapine (REMERON) 30 MG tablet, Take 1 tablet (30 mg total) by mouth at bedtime., Disp: 30 tablet, Rfl: 1 .  naloxone (NARCAN) nasal spray 4 mg/0.1 mL, 1 spray into nostril upon signs of opioid overdose. Call 911. May repeat once if no response within 2-3 minutes., Disp: 1 kit, Rfl: 0 .  nitroGLYCERIN (NITROSTAT) 0.4 MG SL tablet, Place 0.4 mg under the tongue every 5 (five) minutes as needed. , Disp: , Rfl:  .  polyethylene glycol (MIRALAX / GLYCOLAX) packet, Take 17 g by mouth daily as needed for moderate constipation., Disp: , Rfl:  .  predniSONE (DELTASONE) 5 MG tablet, TAKE 1 TABLET BY MOUTH DAILY WITH BREAKFAST., Disp: 30 tablet, Rfl: 1 .  promethazine (PHENERGAN) 25 MG tablet, TAKE 1/2 TO 1 TABLET (12.5-25 MG TOTAL) BY MOUTH EVERY 8 (EIGHT) HOURS AS NEEDED FOR NAUSEA OR VOMITING., Disp: 30 tablet, Rfl: 3 .  Wheat Dextrin (BENEFIBER) POWD, Stir 2 tsp. TID into 4-8 oz of any non-carbonated beverage or soft food (hot or cold), Disp: 500 g, Rfl: PRN .  apalutamide (ERLEADA) 60 MG tablet, Take 4 tablets (240 mg total) by mouth daily. (Patient not taking: Reported on 10/31/2018), Disp: 120 tablet, Rfl: 6 .  ondansetron (ZOFRAN) 8 MG tablet, One pill every 8 hours as needed for nausea/vomitting. (Patient not taking: Reported on 10/31/2018), Disp: 40 tablet, Rfl: 1 .  oxyCODONE (ROXICODONE) 15 MG immediate release tablet, Take 1 tablet (15 mg total) by mouth every 4 (four) hours as needed for pain., Disp: 90 tablet, Rfl: 0 .  traZODone (DESYREL) 50 MG tablet, Take 1-2 tablets (50-100 mg total) by mouth at bedtime as needed for sleep., Disp: 60 tablet, Rfl: 2  Physical exam:  Vitals:   11/01/18 0854  BP: 137/75  Pulse: 80  Temp: (!) 97.3 F (36.3 C)  TempSrc: Tympanic  Weight: 163 lb (73.9 kg)  Height: '5\' 11"'$  (1.803 m)   Physical Exam Constitutional:      Comments: Elderly gentleman in no acute  distress  HENT:     Head: Normocephalic and atraumatic.  Eyes:     Pupils: Pupils are equal, round,  and reactive to light.  Neck:     Musculoskeletal: Normal range of motion.  Cardiovascular:     Rate and Rhythm: Normal rate and regular rhythm.     Heart sounds: Normal heart sounds.  Pulmonary:     Effort: Pulmonary effort is normal.     Breath sounds: Normal breath sounds.  Abdominal:     General: Bowel sounds are normal.     Palpations: Abdomen is soft.  Skin:    General: Skin is warm and dry.  Neurological:     Mental Status: He is alert and oriented to person, place, and time.      CMP Latest Ref Rng & Units 11/01/2018  Glucose 70 - 99 mg/dL 187(H)  BUN 8 - 23 mg/dL 14  Creatinine 0.61 - 1.24 mg/dL 0.77  Sodium 135 - 145 mmol/L 137  Potassium 3.5 - 5.1 mmol/L 4.3  Chloride 98 - 111 mmol/L 102  CO2 22 - 32 mmol/L 25  Calcium 8.9 - 10.3 mg/dL 9.8  Total Protein 6.5 - 8.1 g/dL 6.7  Total Bilirubin 0.3 - 1.2 mg/dL 0.3  Alkaline Phos 38 - 126 U/L 47  AST 15 - 41 U/L 18  ALT 0 - 44 U/L 11   CBC Latest Ref Rng & Units 11/01/2018  WBC 4.0 - 10.5 K/uL 8.0  Hemoglobin 13.0 - 17.0 g/dL 11.6(L)  Hematocrit 39.0 - 52.0 % 35.4(L)  Platelets 150 - 400 K/uL 161    No images are attached to the encounter.  Nm Pet (axumin) Skull Base To Mid Thigh  Result Date: 10/17/2018 CLINICAL DATA:  Prostate carcinoma with biochemical recurrence. EXAM: NUCLEAR MEDICINE PET SKULL BASE TO THIGH TECHNIQUE: 10.3 mCi F-18 Fluciclovine was injected intravenously. Full-ring PET imaging was performed from the skull base to thigh after the radiotracer. CT data was obtained and used for attenuation correction and anatomic localization. COMPARISON:  PET-CT 01/23/2017 FINDINGS: NECK No radiotracer activity in neck lymph nodes. Incidental CT finding: None CHEST Within the LEFT suprahilar region intense focus radiotracer activity localizes to ill-defined tissue on this noncontrast CT. Activity is intense  activity SUV max equal 7.7 (image 229) Several small lymph nodes which accumulate radiotracer. For example small prevascular lymph node measuring 5 mm with SUV max equal 5.9. Similar RIGHT lower paratracheal lymph node similar radiotracer activity (SUV max equal 5.3). This multifocal mediastinal nodal activity is new from comparison exam. Incidental CT finding: 4 mm nodule in the RIGHT lower lobe (image 123/3 is unchanged no new pulmonary nodules. ABDOMEN/PELVIS Prostate: No focal activity in the prostate bed. Lymph nodes: No abnormal radiotracer accumulation within pelvic or abdominal nodes. Liver: No evidence of liver metastasis Incidental CT finding: None SKELETON Several new foci of radiotracer accumulation on the background of diffuse sclerotic skeletal metastasis. Example within the LEFT iliac bone SUV max equal 4.3 lesion. Lesion at the L 3 vertebral body with SUV max equal 8.9. No clear CT changes of other than the diffuse sclerotic lesions described previously. Additional foci in the thoracic spine. Lesion in the RIGHT scapula beneath the glenoid fossa with SUV max equal 6.1. Two lesions within the manubrium with SUV max equal 7.0 IMPRESSION: 1. Progression of prostate cancer metastasis. There are new LEFT suprahilar and small mediastinal lymph nodes with accumulate the prostate cancer specific radiotracer most consistent with mediastinal nodal metastasis. 2. New foci of radiotracer uptake on the background of diffuse skeletal metastasis. Discrete radiotracer avid lesions are present in the iliac bones, lumbar spine, manubrium, and RIGHT  scapula. Additional lesions are present. Electronically Signed   By: Suzy Bouchard M.D.   On: 10/17/2018 15:36     Assessment and plan- Patient is a 68 y.o. male with stage IV castrate resistant prostate cancer and bone metastases.  He is here for on treatment assessment prior to cycle 1 day 1 of Taxotere  1.  Counts okay to proceed with cycle 1 day 1 of weekly  Taxotere today. He has been getting Taxotere at 36 mg per metered square.  He will return to clinic in 1 week's time for video visit with Dr. Rogue Bussing with labs CBC with differential, CMP for cycle 1 day 8 of Taxotere.  He is likely to receive with 2 weeks on 1 week off based on his dosing.  Again discussed risks and benefits of Taxotere including all but not limited to nausea, vomiting, low blood counts, risk of infections and hospitalization.  Hair loss as well as peripheral neuropathy associated with Taxotere.  Treatment is being given with a palliative intent.  Patient understands and agrees to proceed  2.  Neoplasm related pain: Continue fentanyl as well as as needed hydrocodone.  He does follow-up with Altha Harm from palliative care as well.  3.  Bone metastases: He has received Xgeva in the past and given his castrate resistant disease this may be something to consider at this time.  4.  Patient will be due for next dose of Lupron in January 2021.    Visit Diagnosis 1. Prostate cancer metastatic to bone ([place])   2. Encounter for antineoplastic chemotherapy   3. Neoplasm related pain      Dr. Randa Evens, MD, MPH [facility] [phone] 11/04/2018 8:28 AM

## 2018-11-04 NOTE — Telephone Encounter (Signed)
Telephone call to patient for follow up after first chemo he received on Friday.   Patient states has had some nausea but is taking anti-nausea meds.  States he is eating well and drinking plenty of fluids.   Encouraged patient to call for any worsening nausea not relived by antinausea meds or unable to drink plenty of fluids.   Patient thanked me for calling to check on him.

## 2018-11-07 ENCOUNTER — Encounter: Payer: Self-pay | Admitting: Internal Medicine

## 2018-11-07 ENCOUNTER — Other Ambulatory Visit: Payer: Self-pay | Admitting: Internal Medicine

## 2018-11-07 ENCOUNTER — Other Ambulatory Visit: Payer: Self-pay

## 2018-11-07 DIAGNOSIS — C7951 Secondary malignant neoplasm of bone: Secondary | ICD-10-CM

## 2018-11-07 DIAGNOSIS — C61 Malignant neoplasm of prostate: Secondary | ICD-10-CM

## 2018-11-07 DIAGNOSIS — G893 Neoplasm related pain (acute) (chronic): Secondary | ICD-10-CM

## 2018-11-07 MED ORDER — FENTANYL 25 MCG/HR TD PT72: 1.0000 | MEDICATED_PATCH | TRANSDERMAL | 0 refills | Status: DC

## 2018-11-07 MED ORDER — FENTANYL 100 MCG/HR TD PT72: 1.0000 | MEDICATED_PATCH | TRANSDERMAL | 0 refills | Status: DC

## 2018-11-08 ENCOUNTER — Inpatient Hospital Stay: Payer: 59

## 2018-11-08 ENCOUNTER — Other Ambulatory Visit: Payer: Self-pay

## 2018-11-08 ENCOUNTER — Inpatient Hospital Stay: Payer: 59 | Attending: Internal Medicine | Admitting: Internal Medicine

## 2018-11-08 DIAGNOSIS — C61 Malignant neoplasm of prostate: Secondary | ICD-10-CM

## 2018-11-08 DIAGNOSIS — R197 Diarrhea, unspecified: Secondary | ICD-10-CM | POA: Insufficient documentation

## 2018-11-08 DIAGNOSIS — C7951 Secondary malignant neoplasm of bone: Secondary | ICD-10-CM

## 2018-11-08 DIAGNOSIS — Z7189 Other specified counseling: Secondary | ICD-10-CM

## 2018-11-08 DIAGNOSIS — Z79899 Other long term (current) drug therapy: Secondary | ICD-10-CM | POA: Diagnosis not present

## 2018-11-08 DIAGNOSIS — Z5111 Encounter for antineoplastic chemotherapy: Secondary | ICD-10-CM | POA: Insufficient documentation

## 2018-11-08 LAB — COMPREHENSIVE METABOLIC PANEL
ALT: 13 U/L (ref 0–44)
AST: 19 U/L (ref 15–41)
Albumin: 3.6 g/dL (ref 3.5–5.0)
Alkaline Phosphatase: 43 U/L (ref 38–126)
Anion gap: 10 (ref 5–15)
BUN: 17 mg/dL (ref 8–23)
CO2: 24 mmol/L (ref 22–32)
Calcium: 9.4 mg/dL (ref 8.9–10.3)
Chloride: 102 mmol/L (ref 98–111)
Creatinine, Ser: 1.01 mg/dL (ref 0.61–1.24)
GFR calc Af Amer: >60 mL/min (ref >60)
GFR calc non Af Amer: >60 mL/min (ref >60)
Glucose, Bld: 243 mg/dL — ABNORMAL HIGH (ref 70–99)
Potassium: 3.6 mmol/L (ref 3.5–5.1)
Sodium: 136 mmol/L (ref 135–145)
Total Bilirubin: 0.6 mg/dL (ref 0.3–1.2)
Total Protein: 6.4 g/dL — ABNORMAL LOW (ref 6.5–8.1)

## 2018-11-08 LAB — CBC WITH DIFFERENTIAL/PLATELET
Abs Immature Granulocytes: 0.03 10*3/uL (ref 0.00–0.07)
Basophils Absolute: 0 10*3/uL (ref 0.0–0.1)
Basophils Relative: 0 %
Eosinophils Absolute: 0 10*3/uL (ref 0.0–0.5)
Eosinophils Relative: 0 %
HCT: 32.2 % — ABNORMAL LOW (ref 39.0–52.0)
Hemoglobin: 10.7 g/dL — ABNORMAL LOW (ref 13.0–17.0)
Immature Granulocytes: 1 %
Lymphocytes Relative: 16 %
Lymphs Abs: 0.8 10*3/uL (ref 0.7–4.0)
MCH: 31.5 pg (ref 26.0–34.0)
MCHC: 33.2 g/dL (ref 30.0–36.0)
MCV: 94.7 fL (ref 80.0–100.0)
Monocytes Absolute: 0.1 10*3/uL (ref 0.1–1.0)
Monocytes Relative: 2 %
Neutro Abs: 3.9 10*3/uL (ref 1.7–7.7)
Neutrophils Relative %: 81 %
Platelets: 174 10*3/uL (ref 150–400)
RBC: 3.4 MIL/uL — ABNORMAL LOW (ref 4.22–5.81)
RDW: 13 % (ref 11.5–15.5)
WBC: 4.8 10*3/uL (ref 4.0–10.5)
nRBC: 0 % (ref 0.0–0.2)

## 2018-11-08 MED ORDER — SODIUM CHLORIDE 0.9 % IV SOLN: 10.0000 mg | Freq: Once | INTRAVENOUS | Status: DC

## 2018-11-08 MED ORDER — SODIUM CHLORIDE 0.9 % IV SOLN
36.0000 mg/m2 | Freq: Once | INTRAVENOUS | Status: AC
  Administered 2018-11-08: 70 mg via INTRAVENOUS
  Filled 2018-11-08: qty 7

## 2018-11-08 MED ORDER — DEXAMETHASONE SODIUM PHOSPHATE 10 MG/ML IJ SOLN
10.0000 mg | Freq: Once | INTRAMUSCULAR | Status: AC
  Administered 2018-11-08: 10:00:00 10 mg via INTRAVENOUS
  Filled 2018-11-08: qty 1

## 2018-11-08 MED ORDER — SODIUM CHLORIDE 0.9 % IV SOLN
Freq: Once | INTRAVENOUS | Status: AC
  Administered 2018-11-08: 10:00:00 via INTRAVENOUS
  Filled 2018-11-08: qty 250

## 2018-11-08 NOTE — Progress Notes (Signed)
I connected with Robert Short on 11/08/18 at  8:45 AM EST by video enabled telemedicine visit and verified that I am speaking with the correct person using two identifiers.  I discussed the limitations, risks, security and privacy concerns of performing an evaluation and management service by telemedicine and the availability of in-person appointments. I also discussed with the patient that there may be a patient responsible charge related to this service. The patient expressed understanding and agreed to proceed.    Other persons participating in the visit and their role in the encounter: RN/medical reconciliation Patient's location: Office Provider's location: Home  Oncology History Overview Note  # 2011- PROSTATE CANCER [Gleason 3+4]; s/p Prostatectomy [ also involved bladder neck/ECP; Dr.Polaseck]; July 2014- Biochemical recurrence [PSA 14]- started on Zoladex [Dr.Pandit]; lost to follow up.  # JAN 2017- STAGE IV METASTATIC PROSTATE Cancer to Bone- Feb 13th, 2017-  Lupron q 45m [~end of feb]; PSA: 1021; Declined Chemo; April 2017 [xofigo x6; Dr.Crystal]; AUG 2017- Zytiga + Prednisone BID. Bone scan-Jan 2018- improved skeletal metastases.   # MAY 1st 2019- START X-tandi [stopped zytiga/PSA- 8.8/rising]  #August 23, 2018-stop Xtandi [intolerance-fatigue mental fogginess]  #Mid September 2020-apalutamide; stopped mid-October poor tolerance/progression  # Late Oct-Taxotere weekly  # Mets to bone- start X-geva q 70M [May 30th]; hold dental issues/infections  # Smoker/ chronic pain/pain clinic   #[April 2018; [place]]- s/p stenting; on Plavix; --------------------------------------------------------------  DIAGNOSIS: [ ]  Castrate resistant prostate cancer  STAGE:   4    ;GOALS: Palliative  CURRENT/MOST RECENT THERAPY-Late Oct 2020- Taxotere weekly    Prostate cancer metastatic to bone ([place])  12/08/2014 Initial Diagnosis   Prostate cancer metastatic to bone ([place])   11/01/2018 -   Chemotherapy   The patient had DOCEtaxel (TAXOTERE) 70 mg in sodium chloride 0.9 % 150 mL chemo infusion, 36 mg/m2 = 70 mg, Intravenous,  Once, 2 of 6 cycles  for chemotherapy treatment.       Chief Complaint: Prostate cancer   History of present illness:Robert Short 68 y.o.  male with history of metastatic castrate resistant prostate cancer currently on weekly Taxotere is here for follow-up.  Patient received cycle #1 approximately 1 week ago.  In the last 2 days patient noted to have significant loose stools, almost to the point of incontinence.  Patient however has been taking his laxatives and then mag citrate.  Otherwise no nausea no vomiting.  Appetite is good.   Observation/objective:  Assessment and plan: Prostate cancer metastatic to bone ([place]) # Castrate resistant prostate cancer metastases to bone. Lupron every 3 months [09/07/2018]. Sep 2020; PSA- 190; rising. PET aux- 10/15- Progressive bone/ Left suprahilar-mediastinal LN. Currently, on Taxotere weekly.  # Proceed with Taxotere; Labs today reviewed;  acceptable for treatment today.  Given the diarrhea will hold chemotherapy in 1 week.  # Diarrhea/stool incontinence- sec to Taxotere/recommend stopping Laxatives.   # Depression/ anxiety-STABLE. Continue Remeron.  # Malignancy related pain- continue fentanyl patch hydrocodone;. Continue fentanyl patch to every 48 hours and continue hydrocodone. STABLE.  # DISPOSITION:  # Chemo today # Follow-up visit in 2 weeks MD; cbc/cmp/psa- Taxotere weekly-DrB     Follow-up instructions:  I discussed the assessment and treatment plan with the patient.  The patient was provided an opportunity to ask questions and all were answered.  The patient agreed with the plan and demonstrated understanding of instructions.  The patient was advised to call back or seek an in person evaluation if the symptoms worsen  or if the condition fails to improve as anticipated.  Dr. Charlaine Dalton [place] at [facility] 11/08/2018 1:27 PM

## 2018-11-08 NOTE — Assessment & Plan Note (Addendum)
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 Lupron every 3 months [09/07/2018]. Sep 2020; PSA- 190; rising. PET aux- 10/15- Progressive bone/ Left suprahilar-mediastinal LN. Currently, on Taxotere weekly.  # Proceed with Taxotere; Labs today reviewed;  acceptable for treatment today.  Given the diarrhea will hold chemotherapy in 1 week.  # Diarrhea/stool incontinence- sec to Taxotere/recommend stopping Laxatives.   # Depression/ anxiety-STABLE. Continue Remeron.  # Malignancy related pain- continue fentanyl patch hydrocodone;. Continue fentanyl patch to every 48 hours and continue hydrocodone. STABLE.  # DISPOSITION:  # Chemo today # Follow-up visit in 2 weeks MD; cbc/cmp/psa- Taxotere weekly-DrB

## 2018-11-22 ENCOUNTER — Inpatient Hospital Stay: Payer: 59

## 2018-11-22 ENCOUNTER — Inpatient Hospital Stay (HOSPITAL_BASED_OUTPATIENT_CLINIC_OR_DEPARTMENT_OTHER): Payer: 59 | Admitting: Internal Medicine

## 2018-11-22 ENCOUNTER — Encounter: Payer: Self-pay | Admitting: Internal Medicine

## 2018-11-22 ENCOUNTER — Other Ambulatory Visit: Payer: Self-pay

## 2018-11-22 ENCOUNTER — Inpatient Hospital Stay (HOSPITAL_BASED_OUTPATIENT_CLINIC_OR_DEPARTMENT_OTHER): Payer: 59 | Admitting: Hospice and Palliative Medicine

## 2018-11-22 VITALS — BP 136/79 | HR 82 | Resp 20

## 2018-11-22 DIAGNOSIS — G893 Neoplasm related pain (acute) (chronic): Secondary | ICD-10-CM | POA: Diagnosis not present

## 2018-11-22 DIAGNOSIS — Z79899 Other long term (current) drug therapy: Secondary | ICD-10-CM | POA: Diagnosis not present

## 2018-11-22 DIAGNOSIS — C61 Malignant neoplasm of prostate: Secondary | ICD-10-CM | POA: Diagnosis not present

## 2018-11-22 DIAGNOSIS — C7951 Secondary malignant neoplasm of bone: Secondary | ICD-10-CM

## 2018-11-22 DIAGNOSIS — Z515 Encounter for palliative care: Secondary | ICD-10-CM

## 2018-11-22 DIAGNOSIS — Z7189 Other specified counseling: Secondary | ICD-10-CM

## 2018-11-22 DIAGNOSIS — R197 Diarrhea, unspecified: Secondary | ICD-10-CM | POA: Diagnosis not present

## 2018-11-22 DIAGNOSIS — Z5111 Encounter for antineoplastic chemotherapy: Secondary | ICD-10-CM | POA: Diagnosis not present

## 2018-11-22 LAB — COMPREHENSIVE METABOLIC PANEL
ALT: 12 U/L (ref 0–44)
AST: 18 U/L (ref 15–41)
Albumin: 3.9 g/dL (ref 3.5–5.0)
Alkaline Phosphatase: 40 U/L (ref 38–126)
Anion gap: 11 (ref 5–15)
BUN: 13 mg/dL (ref 8–23)
CO2: 25 mmol/L (ref 22–32)
Calcium: 9.8 mg/dL (ref 8.9–10.3)
Chloride: 100 mmol/L (ref 98–111)
Creatinine, Ser: 1.1 mg/dL (ref 0.61–1.24)
GFR calc Af Amer: >60 mL/min (ref >60)
GFR calc non Af Amer: >60 mL/min (ref >60)
Glucose, Bld: 207 mg/dL — ABNORMAL HIGH (ref 70–99)
Potassium: 4.4 mmol/L (ref 3.5–5.1)
Sodium: 136 mmol/L (ref 135–145)
Total Bilirubin: 0.6 mg/dL (ref 0.3–1.2)
Total Protein: 6.8 g/dL (ref 6.5–8.1)

## 2018-11-22 LAB — CBC WITH DIFFERENTIAL/PLATELET
Abs Immature Granulocytes: 0.06 10*3/uL (ref 0.00–0.07)
Basophils Absolute: 0 10*3/uL (ref 0.0–0.1)
Basophils Relative: 0 %
Eosinophils Absolute: 0 10*3/uL (ref 0.0–0.5)
Eosinophils Relative: 0 %
HCT: 36 % — ABNORMAL LOW (ref 39.0–52.0)
Hemoglobin: 11.6 g/dL — ABNORMAL LOW (ref 13.0–17.0)
Immature Granulocytes: 1 %
Lymphocytes Relative: 14 %
Lymphs Abs: 0.8 10*3/uL (ref 0.7–4.0)
MCH: 31.3 pg (ref 26.0–34.0)
MCHC: 32.2 g/dL (ref 30.0–36.0)
MCV: 97 fL (ref 80.0–100.0)
Monocytes Absolute: 0.1 10*3/uL (ref 0.1–1.0)
Monocytes Relative: 2 %
Neutro Abs: 4.7 10*3/uL (ref 1.7–7.7)
Neutrophils Relative %: 83 %
Platelets: 222 10*3/uL (ref 150–400)
RBC: 3.71 MIL/uL — ABNORMAL LOW (ref 4.22–5.81)
RDW: 13.7 % (ref 11.5–15.5)
WBC: 5.8 10*3/uL (ref 4.0–10.5)
nRBC: 0 % (ref 0.0–0.2)

## 2018-11-22 LAB — PSA: Prostatic Specific Antigen: 254 ng/mL — ABNORMAL HIGH (ref 0.00–4.00)

## 2018-11-22 MED ORDER — SODIUM CHLORIDE 0.9 % IV SOLN
Freq: Once | INTRAVENOUS | Status: AC
  Administered 2018-11-22: 10:00:00 via INTRAVENOUS
  Filled 2018-11-22: qty 250

## 2018-11-22 MED ORDER — DEXAMETHASONE SODIUM PHOSPHATE 10 MG/ML IJ SOLN
10.0000 mg | Freq: Once | INTRAMUSCULAR | Status: AC
  Administered 2018-11-22: 10 mg via INTRAVENOUS
  Filled 2018-11-22: qty 1

## 2018-11-22 MED ORDER — SODIUM CHLORIDE 0.9 % IV SOLN
36.0000 mg/m2 | Freq: Once | INTRAVENOUS | Status: AC
  Administered 2018-11-22: 11:00:00 70 mg via INTRAVENOUS
  Filled 2018-11-22: qty 7

## 2018-11-22 MED ORDER — CITALOPRAM HYDROBROMIDE 20 MG PO TABS: 20.0000 mg | ORAL_TABLET | Freq: Every day | ORAL | 2 refills | Status: DC

## 2018-11-22 NOTE — Assessment & Plan Note (Addendum)
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 Lupron every 3 months [10/04/2018]. Sep 2020; PSA- 190; rising. PET aux- 10/15- Progressive bone/ Left suprahilar-mediastinal LN. Currently, on Taxotere weekly.  # Proceed with Taxotere; Labs today reviewed;  acceptable for treatment today.  PSA from today pending.  Understands to early to [place] response.  # Diarrhea/stool incontinence- sec to Taxotere- Improved. Monitor closely.  # Depression/ anxiety- STABLE.  Continue Remeron.  # Malignancy related pain- continue fentanyl patch hydrocodone;. Continue fentanyl patch to every 48 hours and continue hydrocodone.   # Headache- x 5 days [sec ? Zofran]; STOP Zofran; continue phenergan.   # DISPOSITION:  # Chemo today # Follow-up visit in 2 weeks MD; cbc/cmp/psa- Taxotere weekly-Dr. B

## 2018-11-22 NOTE — Progress Notes (Signed)
[place]  [phone] [phone]   Name: Robert Short Date: 11/22/2018 MRN: [id number]  DOB: 1950/03/03  Patient Care Team: Olin Hauser, [name], [name], [name]    REASON FOR CONSULTATION: Palliative Care consult requested for this 68 y.o. male with multiple medical problems including stage IV prostate cancer metastatic to bone status post Xtandi.  Patient has had multiple symptoms including fatigue and pain.  He was referred to palliative care to help with symptom management and to address goals.  SOCIAL HISTORY:     reports that he has been smoking cigarettes. He has a 20.00 pack-year smoking history. His smokeless tobacco use includes chew. He reports current alcohol use. He reports that he does not use drugs.   Patient is married and lives at home with his wife. He has five children. Patient used to work as a Pharmacist, hospital, Administrator, and Engineer, production.  ADVANCE DIRECTIVES:  Does not have  CODE STATUS:   PAST MEDICAL HISTORY: Past Medical History:  Diagnosis Date  . Back pain 10/09/2012  . Bone cancer ([place])   . CAD (coronary artery disease)    a. 08/2015 PCI: LM nl, LAD 20d, LCX nl, RCA 73m RPAV 95 (3.0x18 Xience Alpine DES);  b. 04/2016 PCI ([facility]: RPL 95 (2.75x8 Promus Premier DES).  . Cancer associated pain   . Depression   . History of echocardiogram    a. 08/2016 Echo: EF 50-55%.  .Marland KitchenHistory of kidney stones   . Hyperlipidemia   . Hypertension   . Joint pain   . Prostate cancer ([facility]    a.  s/p prostatectomy ([facility]);  b. bone mets noted 04/2016.  . Right arm pain 01/10/2016  . Right foot pain 01/10/2016  . Right leg pain 01/10/2016  . Tobacco abuse     PAST SURGICAL HISTORY:  Past Surgical History:  Procedure Laterality Date  .  CARDIAC CATHETERIZATION     [facility]  . CARDIAC CATHETERIZATION N/A 08/16/2015   Procedure: Left Heart Cath and Coronary Angiography;  Surgeon: DYolonda Kida MD;  [facility];  Service: Cardiovascular;  Laterality: N/A;  . CARDIAC CATHETERIZATION N/A 08/16/2015   Procedure: Coronary Stent Intervention;  Surgeon: DYolonda Kida MD;  [facility];  Service: Cardiovascular;  Laterality: N/A;  . CERVIX SURGERY    . PROSTATECTOMY    . SPINE SURGERY    . TONSILLECTOMY    . WRIST SURGERY      HEMATOLOGY/ONCOLOGY HISTORY:  Oncology History Overview Note  # 2011- PROSTATE CANCER [Gleason 3+4]; s/p Prostatectomy [ also involved bladder neck/ECP; Dr.Polaseck]; July 2014- Biochemical recurrence [PSA 14]- started on Zoladex [Dr.Pandit]; lost to follow up.  # JAN 2017- STAGE IV METASTATIC PROSTATE Cancer to Bone- Feb 13th, 2017-  Lupron q 342m~end of feb]; PSA: 1021; Declined Chemo; April 2017 [xofigo x6; Dr.Crystal]; AUG 2017- Zytiga + Prednisone BID. Bone scan-Jan 2018- improved skeletal metastases.   # MAY 1st 2019- START X-tandi [stopped zytiga/PSA- 8.8/rising]  #August 23, 2018-stop Xtandi [intolerance-fatigue mental fogginess]  #Mid September 2020-apalutamide; stopped mid-October poor tolerance/progression  # Late Oct-Taxotere weekly  # Mets to bone- start X-geva q 28M [May 30th]; hold dental issues/infections  # Smoker/ chronic pain/pain clinic   #[April 2018; [place]]- s/p stenting; on Plavix; --------------------------------------------------------------  DIAGNOSIS: _0  Castrate  resistant prostate cancer  STAGE:   4    ;GOALS: Palliative  CURRENT/MOST RECENT THERAPY-Late Oct 2020- Taxotere weekly    Prostate cancer metastatic to bone ([place])  12/08/2014 Initial Diagnosis   Prostate cancer metastatic to bone ([place])   11/01/2018 -  Chemotherapy   The patient had DOCEtaxel (TAXOTERE) 70 mg in sodium chloride 0.9 % 150 mL chemo infusion, 36  mg/m2 = 70 mg, Intravenous,  Once, 3 of 6 cycles Administration: 70 mg (11/08/2018)  for chemotherapy treatment.      ALLERGIES:  is allergic to ditropan [oxybutynin].  MEDICATIONS:  Current Outpatient Medications  Medication Sig Dispense Refill  . acetaminophen (TYLENOL) 500 MG tablet Take 1,000 mg by mouth every 8 (eight) hours as needed.     Marland Kitchen albuterol (PROVENTIL HFA;VENTOLIN HFA) 108 (90 Base) MCG/ACT inhaler Inhale 1-2 puffs into the lungs every 6 (six) hours as needed for wheezing or shortness of breath. 1 Inhaler 0  . aspirin 81 MG tablet Take 81 mg by mouth daily.    Marland Kitchen atorvastatin (LIPITOR) 80 MG tablet TAKE 1 TABLET BY MOUTH DAILY AT 6 PM. 90 tablet 3  . chlorhexidine (PERIDEX) 0.12 % solution     . clopidogrel (PLAVIX) 75 MG tablet TAKE 1 TABLET BY MOUTH DAILY 90 tablet 0  . dexamethasone (DECADRON) 4 MG tablet Take one pill AM & PM x 3 days; start the day prior to chemo. 60 tablet 1  . fentaNYL (DURAGESIC) 100 MCG/HR Place 1 patch onto the skin every other day. Along with 4mg patch for total of 125 mcg every 2 days. 15 patch 0  . fentaNYL (DURAGESIC) 25 MCG/HR Place 1 patch onto the skin every other day. Use this along with fentanyl patch 100 mcg [total 125 mcg] every 2 days 15 patch 0  . gabapentin (NEURONTIN) 100 MG capsule START 1 CAPSULE DAILY, INCREASE BY 1 CAP EVERY 2 TO 3 DAYS AS TOLERATED UP TO 3 TIMES A DAY, OR MAY TAKE 3 AT ONCE IN [place] 90 capsule 5  . magnesium hydroxide (MILK OF MAGNESIA) 400 MG/5ML suspension Take 5 mLs by mouth daily as needed for constipation.    . metoprolol tartrate (LOPRESSOR) 25 MG tablet TAKE 1/2 TABLET BY MOUTH 2 TIMES DAILY. 90 tablet 1  . mirtazapine (REMERON) 30 MG tablet Take 1 tablet (30 mg total) by mouth at bedtime. 30 tablet 1  . naloxone (NARCAN) nasal spray 4 mg/0.1 mL 1 spray into nostril upon signs of opioid overdose. Call 911. May repeat once if no response within 2-3 minutes. (Patient not taking: Reported on 11/22/2018) 1  kit 0  . nitroGLYCERIN (NITROSTAT) 0.4 MG SL tablet Place 0.4 mg under the tongue every 5 (five) minutes as needed.     . ondansetron (ZOFRAN) 8 MG tablet One pill every 8 hours as needed for nausea/vomitting. 40 tablet 1  . oxyCODONE (ROXICODONE) 15 MG immediate release tablet Take 1 tablet (15 mg total) by mouth every 4 (four) hours as needed for pain. 90 tablet 0  . polyethylene glycol (MIRALAX / GLYCOLAX) packet Take 17 g by mouth daily as needed for moderate constipation.    . predniSONE (DELTASONE) 5 MG tablet TAKE 1 TABLET BY MOUTH DAILY WITH BREAKFAST. 30 tablet 1  . promethazine (PHENERGAN) 25 MG tablet TAKE 1/2 TO 1 TABLET (12.5-25 MG TOTAL) BY MOUTH EVERY 8 (EIGHT) HOURS AS NEEDED FOR NAUSEA OR VOMITING. 30 tablet 3  . traZODone (DESYREL) 50 MG tablet Take 1-2 tablets (50-100 mg total)  by mouth at bedtime as needed for sleep. 60 tablet 2  . Wheat Dextrin (BENEFIBER) POWD Stir 2 tsp. TID into 4-8 oz of any non-carbonated beverage or soft food (hot or cold) 500 g PRN   No current facility-administered medications for this visit.    Facility-Administered Medications Ordered in Other Visits  Medication Dose Route Frequency Provider Last Rate Last Dose  . DOCEtaxel (TAXOTERE) 70 mg in sodium chloride 0.9 % 150 mL chemo infusion  36 mg/m2 (Treatment Plan Recorded) Intravenous Once [name], MD 157 mL/hr at 11/22/18 1047 70 mg at 11/22/18 1047    VITAL SIGNS: There were no vitals taken for this visit. There were no vitals filed for this visit.  Estimated body mass index is 22.59 kg/m as calculated from the following:   Height as of an earlier encounter on 11/22/18: _0  (1.803 m).   Weight as of an earlier encounter on 11/22/18: 162 lb (73.5 kg).  LABS: CBC:    Component Value Date/Time   WBC 5.8 11/22/2018 0824   HGB 11.6 (L) 11/22/2018 0824   HGB 14.4 11/03/2013 1619   HCT 36.0 (L) 11/22/2018 0824   HCT 44.2 11/03/2013 1619   PLT 222 11/22/2018 0824   PLT 149  (L) 11/03/2013 1619   MCV 97.0 11/22/2018 0824   MCV 96 11/03/2013 1619   NEUTROABS 4.7 11/22/2018 0824   NEUTROABS 10.7 (H) 11/03/2013 1619   LYMPHSABS 0.8 11/22/2018 0824   LYMPHSABS 2.0 11/03/2013 1619   MONOABS 0.1 11/22/2018 0824   MONOABS 1.3 (H) 11/03/2013 1619   EOSABS 0.0 11/22/2018 0824   EOSABS 0.1 11/03/2013 1619   BASOSABS 0.0 11/22/2018 0824   BASOSABS 0.1 11/03/2013 1619   Comprehensive Metabolic Panel:    Component Value Date/Time   NA 136 11/22/2018 0824   NA 140 07/13/2013 1542   K 4.4 11/22/2018 0824   K 4.3 07/13/2013 1542   CL 100 11/22/2018 0824   CL 107 07/13/2013 1542   CO2 25 11/22/2018 0824   CO2 26 07/13/2013 1542   BUN 13 11/22/2018 0824   BUN 11 07/13/2013 1542   CREATININE 1.10 11/22/2018 0824   CREATININE 0.76 10/15/2017 1001   GLUCOSE 207 (H) 11/22/2018 0824   GLUCOSE 106 (H) 07/13/2013 1542   CALCIUM 9.8 11/22/2018 0824   CALCIUM 9.9 07/13/2013 1542   AST 18 11/22/2018 0824   AST 26 07/13/2013 1542   ALT 12 11/22/2018 0824   ALT 32 07/13/2013 1542   ALKPHOS 40 11/22/2018 0824   ALKPHOS 74 07/13/2013 1542   BILITOT 0.6 11/22/2018 0824   BILITOT 0.4 07/13/2013 1542   PROT 6.8 11/22/2018 0824   PROT 8.1 07/13/2013 1542   ALBUMIN 3.9 11/22/2018 0824   ALBUMIN 4.3 07/13/2013 1542    RADIOGRAPHIC STUDIES: No results found.  PERFORMANCE STATUS (ECOG) : 2 - Symptomatic, <50% confined to bed  Review of Systems Unless otherwise noted, a complete review of systems is negative.  Physical Exam General: NAD, frail appearing, thin Pulmonary: Unlabored Extremities: no edema, no joint deformities Skin: no rashes Neurological: Weakness but otherwise nonfocal  IMPRESSION: Routine follow-up visit today.  Patient is accompanied by his wife.  Patient called in the clinic yesterday complaining of severe pain.  Today, he says that the pain is at baseline around 3-4 out of 10.  He continues to use oxycodone 3-4 times a day.  We discussed using  it up to every 4 hours as needed.  Patient endorses depression and anxiety.  He was previously managed on mirtazapine but has stopped taking that.  He is in agreement with trial of citalopram.  Patient continues to have insomnia, which he attributes to having nausea.  We discussed use of an antiemetic near bedtime.  He also takes trazodone as needed.  Case and plan discussed with Dr. Rogue Bussing PLAN: -Continue current scope of treatment -Continue oxycodone to 15 mg every 4 hours as needed for  breakthrough pain -Trazodone 50 to 100 mg nightly for sleep (#60) -Stop mirtazapine.  -Start citalopram 60m daily -We will need to establish ACP -Follow-up telephone visit in 1 month   Patient expressed understanding and was in agreement with this plan. He also understands that He can call the clinic at any time with any questions, concerns, or complaints.     Time Total: 20 minutes  Visit consisted of counseling and education dealing with the complex and emotionally intense issues of symptom management and palliative care in the setting of serious and potentially life-threatening illness.Greater than 50%  of this time was spent counseling and coordinating care related to the above assessment and plan.  Signed by: JAltha Harm PhD, NP-C [phone](Work Cell)

## 2018-11-22 NOTE — Progress Notes (Signed)
[place] OFFICE PROGRESS NOTE  Patient Care Team: Olin Hauser, [name], [name], [name]  Cancer Staging No matching staging information was found for the patient.   Oncology History Overview Note  # 2011- PROSTATE CANCER [Gleason 3+4]; s/p Prostatectomy [ also involved bladder neck/ECP; Dr.Polaseck]; July 2014- Biochemical recurrence [PSA 14]- started on Zoladex [Dr.Pandit]; lost to follow up.  # JAN 2017- STAGE IV METASTATIC PROSTATE Cancer to Bone- Feb 13th, 2017-  Lupron q 19m[~end of feb]; PSA: 1021; Declined Chemo; April 2017 [xofigo x6; Dr.Crystal]; AUG 2017- Zytiga + Prednisone BID. Bone scan-Jan 2018- improved skeletal metastases.   # MAY 1st 2019- START X-tandi [stopped zytiga/PSA- 8.8/rising]  #August 23, 2018-stop Xtandi [intolerance-fatigue mental fogginess]  #Mid September 2020-apalutamide; stopped mid-October poor tolerance/progression  # Late Oct-Taxotere weekly  # Mets to bone- start X-geva q 72M [May 30th]; hold dental issues/infections  # Smoker/ chronic pain/pain clinic   #[April 2018; [place]]- s/p stenting; on Plavix; --------------------------------------------------------------  DIAGNOSIS: _0  Castrate resistant prostate cancer  STAGE:   4    ;GOALS: Palliative  CURRENT/MOST RECENT THERAPY-Late Oct 2020- Taxotere weekly    [place]  12/08/2014 Initial Diagnosis   [place]   11/01/2018 -  Chemotherapy   The patient had DOCEtaxel (TAXOTERE) 70 mg in sodium chloride 0.9 % 150 mL chemo infusion, 36 mg/m2 = 70 mg, Intravenous,  Once, 3 of 6 cycles Administration: 70 mg (11/08/2018)  for chemotherapy treatment.     INTERVAL HISTORY:  Robert BARRIERE89y.o.  male pleasant patient above history of metastatic castrate  resistant prostate cancer currently -on Taxotere weekly is here for follow-up.  Patient has had 2 weekly treatment so far.  Last treatment was approximately 2 weeks ago.  Patient noted to have worsening headaches for last 5 days.  Given the nausea has been taking Zofran; along with Phenergan.  Denies any vomiting.  Patient diarrhea is improved.  Denies having any rectal incontinence since then.   His appetite is fair.  His joint pains back pain/neck pain is stable.  Mild swelling in ankles.   Review of Systems  Constitutional: Positive for malaise/fatigue and weight loss. Negative for chills, diaphoresis and fever.  HENT: Negative for nosebleeds and sore throat.   Eyes: Negative for double vision.  Respiratory: Positive for shortness of breath. Negative for cough, hemoptysis, sputum production and wheezing.   Cardiovascular: Negative for chest pain, palpitations, orthopnea and leg swelling.  Gastrointestinal: Positive for constipation. Negative for abdominal pain, blood in stool, diarrhea, heartburn, melena, nausea and vomiting.  Genitourinary: Negative for dysuria, frequency and urgency.  Musculoskeletal: Positive for back pain and joint pain.  Skin: Negative.  Negative for itching and rash.  Neurological: Negative for dizziness, tingling, focal weakness, weakness and headaches.  Endo/Heme/Allergies: Does not bruise/bleed easily.  Psychiatric/Behavioral: Negative for depression. The patient is not nervous/anxious and does not have insomnia.       PAST MEDICAL HISTORY :  Past Medical History:  Diagnosis Date  . Back pain 10/09/2012  . [place] cancer ([place]   . CAD (coronary artery disease)    a. 08/2015 PCI: LM nl, LAD 20d, LCX nl, RCA 210mRPAV 95 (3.0x18 Xience Alpine DES);  [facility]2.75x8 Promus Premier DES).  . Cancer associated pain   .  Depression   . History of echocardiogram    a. 08/2016 Echo: EF 50-55%.  Marland Kitchen History of kidney stones   . Hyperlipidemia    . Hypertension   . Joint pain   . Prostate cancer [facility])    a.  s/p prostatectomy ([facility]);  b. bone mets noted 04/2016.  . Right arm pain 01/10/2016  . Right foot pain 01/10/2016  . Right leg pain 01/10/2016  . Tobacco abuse     PAST SURGICAL HISTORY :   Past Surgical History:  Procedure Laterality Date  . CARD[facility]AC CATHETERIZATION     [facility]  . CARDIAC CATHETERIZATION N/A 08/16/2015   Procedure: Left Heart Cath and Coronary Angiography;  Surgeon: Yolonda Kida, MD;  Location: [place] [facility];  Service: Cardiovascular;  Laterality: N/A;  . CARDIAC CATHETERIZATION N/A 08/16/2015   Procedure: Coronary Stent Intervention;  Surgeon: Yolonda Kida, MD;  Location: [place] CV LAB;  Service: Cardiovascular;  Laterality: N/A;  . CERVIX SURGERY    . PROSTATECTOMY    . SPINE SURGERY    . TONSILLECTOMY    . WRIST SURGERY      FAMILY HISTORY :   Family History  Problem Relation Age of Onset  . Heart attack Mother   . Hypertension Mother   . Heart attack Father     SOCIAL HISTORY:   Social History   Tobacco Use  . Smoking status: Current Every Day Smoker    Packs/day: 0.50    Years: 40.00    Pack years: 20.00    Types: Cigarettes  . Smokeless tobacco: Current User    Types: Chew  . Tobacco comment: 1 pack every couple days   Substance Use Topics  . Alcohol use: Yes    Frequency: Never    Comment: occasionally  . Drug use: No    ALLERGIES:  is allergic to ditropan [oxybutynin].  MEDICATIONS:  Current Outpatient Medications  Medication Sig Dispense Refill  . acetaminophen (TYLENOL) 500 MG tablet Take 1,000 mg by mouth every 8 (eight) hours as needed.     Marland Kitchen albuterol (PROVENTIL HFA;VENTOLIN HFA) 108 (90 Base) MCG/ACT inhaler Inhale 1-2 puffs into the lungs every 6 (six) hours as needed for wheezing or shortness of breath. 1 Inhaler 0  . aspirin 81 MG tablet Take 81 mg by mouth daily.    Marland Kitchen atorvastatin (LIPITOR) 80 MG tablet TAKE 1 TABLET BY MOUTH DAILY AT 6 PM.  90 tablet 3  . chlorhexidine (PERIDEX) 0.12 % solution     . clopidogrel (PLAVIX) 75 MG tablet TAKE 1 TABLET BY MOUTH DAILY 90 tablet 0  . dexamethasone (DECADRON) 4 MG tablet Take one pill AM & PM x 3 days; start the day prior to chemo. 60 tablet 1  . fentaNYL (DURAGESIC) 100 MCG/HR Place 1 patch onto the skin every other day. Along with 20mg patch for total of 125 mcg every 2 days. 15 patch 0  . fentaNYL (DURAGESIC) 25 MCG/HR Place 1 patch onto the skin every other day. Use this along with fentanyl patch 100 mcg [total 125 mcg] every 2 days 15 patch 0  . gabapentin (NEURONTIN) 100 MG capsule START 1 CAPSULE DAILY, INCREASE BY 1 CAP EVERY 2 TO 3 DAYS AS TOLERATED UP TO 3 TIMES A DAY, OR MAY TAKE 3 AT ONCE IN [place] 90 capsule 5  . magnesium hydroxide (MILK OF MAGNESIA) 400 MG/5ML suspension Take 5 mLs by mouth daily as needed for constipation.    . metoprolol tartrate (  LOPRESSOR) 25 MG tablet TAKE 1/2 TABLET BY MOUTH 2 TIMES DAILY. 90 tablet 1  . ondansetron (ZOFRAN) 8 MG tablet One pill every 8 hours as needed for nausea/vomitting. 40 tablet 1  . oxyCODONE (ROXICODONE) 15 MG immediate release tablet Take 1 tablet (15 mg total) by mouth every 4 (four) hours as needed for pain. 90 tablet 0  . polyethylene glycol (MIRALAX / GLYCOLAX) packet Take 17 g by mouth daily as needed for moderate constipation.    . predniSONE (DELTASONE) 5 MG tablet TAKE 1 TABLET BY MOUTH DAILY WITH BREAKFAST. 30 tablet 1  . promethazine (PHENERGAN) 25 MG tablet TAKE 1/2 TO 1 TABLET (12.5-25 MG TOTAL) BY MOUTH EVERY 8 (EIGHT) HOURS AS NEEDED FOR NAUSEA OR VOMITING. 30 tablet 3  . traZODone (DESYREL) 50 MG tablet Take 1-2 tablets (50-100 mg total) by mouth at bedtime as needed for sleep. 60 tablet 2  . Wheat Dextrin (BENEFIBER) POWD Stir 2 tsp. TID into 4-8 oz of any non-carbonated beverage or soft food (hot or cold) 500 g PRN  . citalopram (CELEXA) 20 MG tablet Take 1 tablet (20 mg total) by mouth daily. 30 tablet 2  .  naloxone (NARCAN) nasal spray 4 mg/0.1 mL 1 spray into nostril upon signs of opioid overdose. Call 911. May repeat once if no response within 2-3 minutes. (Patient not taking: Reported on 11/22/2018) 1 kit 0  . nitroGLYCERIN (NITROSTAT) 0.4 MG SL tablet Place 0.4 mg under the tongue every 5 (five) minutes as needed.      No current facility-administered medications for this visit.     PHYSICAL EXAMINATION: ECOG PERFORMANCE STATUS: 1 - Symptomatic but completely ambulatory  BP (!) 168/98 (BP Location: Left Arm, Patient Position: Sitting, Cuff Size: Normal)   Pulse (!) 104   Temp 98 F (36.7 C) (Tympanic)   Resp 20   Ht _0  (1.803 m)   Wt 162 lb (73.5 kg)   BMI 22.59 kg/m   Filed Weights   11/22/18 0844  Weight: 162 lb (73.5 kg)    Physical Exam  Constitutional: He is oriented to person, place, and time.   Frail-appearing Caucasian male patient.  He is walking with a cane.    HENT:  Head: Normocephalic and atraumatic.  Mouth/Throat: Oropharynx is clear and moist. No oropharyngeal exudate.  Poor dentition.  Eyes: Pupils are equal, round, and reactive to light.  Neck: Normal range of motion. Neck supple.  Cardiovascular: Normal rate and regular rhythm.  Pulmonary/Chest: No respiratory distress. He has no wheezes.  Decreased breath sounds bilaterally at the bases.  No wheeze or crackles  Abdominal: Soft. Bowel sounds are normal. He exhibits no distension and no mass. There is no abdominal tenderness. There is no rebound and no guarding.  Musculoskeletal: Normal range of motion.        General: No tenderness or edema.  Neurological: He is alert and oriented to person, place, and time.  Skin: Skin is warm.  Psychiatric: Affect normal.       LABORATORY DATA:  I have reviewed the data as listed    Component Value Date/Time   NA 136 11/22/2018 0824   NA 140 07/13/2013 1542   K 4.4 11/22/2018 0824   K 4.3 07/13/2013 1542   CL 100 11/22/2018 0824   CL 107 07/13/2013  1542   CO2 25 11/22/2018 0824   CO2 26 07/13/2013 1542   GLUCOSE 207 (H) 11/22/2018 0824   GLUCOSE 106 (H) 07/13/2013 1542   BUN  13 11/22/2018 0824   BUN 11 07/13/2013 1542   CREATININE 1.10 11/22/2018 0824   CREATININE 0.76 10/15/2017 1001   CALCIUM 9.8 11/22/2018 0824   CALCIUM 9.9 07/13/2013 1542   PROT 6.8 11/22/2018 0824   PROT 8.1 07/13/2013 1542   ALBUMIN 3.9 11/22/2018 0824   ALBUMIN 4.3 07/13/2013 1542   AST 18 11/22/2018 0824   AST 26 07/13/2013 1542   ALT 12 11/22/2018 0824   ALT 32 07/13/2013 1542   ALKPHOS 40 11/22/2018 0824   ALKPHOS 74 07/13/2013 1542   BILITOT 0.6 11/22/2018 0824   BILITOT 0.4 07/13/2013 1542   GFRNONAA >60 11/22/2018 0824   GFRNONAA 95 10/15/2017 1001   GFRAA >60 11/22/2018 0824   GFRAA 110 10/15/2017 1001    No results found for: SPEP, UPEP  Lab Results  Component Value Date   WBC 5.8 11/22/2018   NEUTROABS 4.7 11/22/2018   HGB 11.6 (L) 11/22/2018   HCT 36.0 (L) 11/22/2018   MCV 97.0 11/22/2018   PLT 222 11/22/2018      Chemistry      Component Value Date/Time   NA 136 11/22/2018 0824   NA 140 07/13/2013 1542   K 4.4 11/22/2018 0824   K 4.3 07/13/2013 1542   CL 100 11/22/2018 0824   CL 107 07/13/2013 1542   CO2 25 11/22/2018 0824   CO2 26 07/13/2013 1542   BUN 13 11/22/2018 0824   BUN 11 07/13/2013 1542   CREATININE 1.10 11/22/2018 0824   CREATININE 0.76 10/15/2017 1001      Component Value Date/Time   CALCIUM 9.8 11/22/2018 0824   CALCIUM 9.9 07/13/2013 1542   ALKPHOS 40 11/22/2018 0824   ALKPHOS 74 07/13/2013 1542   AST 18 11/22/2018 0824   AST 26 07/13/2013 1542   ALT 12 11/22/2018 0824   ALT 32 07/13/2013 1542   BILITOT 0.6 11/22/2018 0824   BILITOT 0.4 07/13/2013 1542       RADIOGRAPHIC STUDIES: I have personally reviewed the radiological images as listed and agreed with the findings in the report. No results found.   ASSESSMENT & PLAN:  Prostate cancer metastatic to bone ([place]) # Castrate resistant  prostate cancer metastases to bone. Lupron every 3 months [10/04/2018]. Sep 2020; PSA- 190; rising. PET aux- 10/15- Progressive bone/ Left suprahilar-mediastinal LN. Currently, on Taxotere weekly.  # Proceed with Taxotere; Labs today reviewed;  acceptable for treatment today.  PSA from today pending.  Understands to early to [place] response.  # Diarrhea/stool incontinence- sec to Taxotere- Improved. Monitor closely.  # Depression/ anxiety- STABLE.  Continue Remeron.  # Malignancy related pain- continue fentanyl patch hydrocodone;. Continue fentanyl patch to every 48 hours and continue hydrocodone.   # Headache- x 5 days [sec ? Zofran]; STOP Zofran; continue phenergan.   # DISPOSITION:  # Chemo today # Follow-up visit in 2 weeks MD; cbc/cmp/psa- Taxotere weekly-Dr. B      No orders of the defined types were placed in this encounter.  All questions were answered. The patient knows to call the clinic with any problems, questions or concerns.      [name], MD 11/22/2018 11:53 AM

## 2018-11-27 ENCOUNTER — Other Ambulatory Visit: Payer: Self-pay | Admitting: *Deleted

## 2018-11-27 ENCOUNTER — Encounter: Payer: Self-pay | Admitting: Internal Medicine

## 2018-11-27 NOTE — Telephone Encounter (Signed)
Wife contacted RN at cancer center. Pt needs RF on oxycodone.

## 2018-11-28 ENCOUNTER — Other Ambulatory Visit: Payer: Self-pay | Admitting: Hospice and Palliative Medicine

## 2018-11-28 MED ORDER — OXYCODONE HCL 15 MG PO TABS: 15.0000 mg | ORAL_TABLET | ORAL | 0 refills | Status: DC | PRN

## 2018-11-28 NOTE — Progress Notes (Signed)
Oxycodone refilled (#90). PDMP reviewed.

## 2018-12-02 ENCOUNTER — Encounter: Payer: 59 | Admitting: Hospice and Palliative Medicine

## 2018-12-03 ENCOUNTER — Other Ambulatory Visit: Payer: Self-pay | Admitting: *Deleted

## 2018-12-03 DIAGNOSIS — C7951 Secondary malignant neoplasm of bone: Secondary | ICD-10-CM

## 2018-12-05 ENCOUNTER — Other Ambulatory Visit: Payer: Self-pay

## 2018-12-05 ENCOUNTER — Inpatient Hospital Stay: Payer: 59 | Attending: Internal Medicine

## 2018-12-05 DIAGNOSIS — F329 Major depressive disorder, single episode, unspecified: Secondary | ICD-10-CM | POA: Insufficient documentation

## 2018-12-05 DIAGNOSIS — C61 Malignant neoplasm of prostate: Secondary | ICD-10-CM | POA: Diagnosis not present

## 2018-12-05 DIAGNOSIS — C7951 Secondary malignant neoplasm of bone: Secondary | ICD-10-CM | POA: Insufficient documentation

## 2018-12-05 DIAGNOSIS — Z79899 Other long term (current) drug therapy: Secondary | ICD-10-CM | POA: Diagnosis not present

## 2018-12-05 DIAGNOSIS — R5383 Other fatigue: Secondary | ICD-10-CM | POA: Diagnosis not present

## 2018-12-05 DIAGNOSIS — R519 Headache, unspecified: Secondary | ICD-10-CM | POA: Insufficient documentation

## 2018-12-05 DIAGNOSIS — Z5111 Encounter for antineoplastic chemotherapy: Secondary | ICD-10-CM | POA: Insufficient documentation

## 2018-12-05 LAB — PSA: Prostatic Specific Antigen: 244 ng/mL — ABNORMAL HIGH (ref 0.00–4.00)

## 2018-12-05 LAB — CBC WITH DIFFERENTIAL/PLATELET
Abs Immature Granulocytes: 0.07 10*3/uL (ref 0.00–0.07)
Basophils Absolute: 0 10*3/uL (ref 0.0–0.1)
Basophils Relative: 0 %
Eosinophils Absolute: 0.1 10*3/uL (ref 0.0–0.5)
Eosinophils Relative: 1 %
HCT: 33.6 % — ABNORMAL LOW (ref 39.0–52.0)
Hemoglobin: 11 g/dL — ABNORMAL LOW (ref 13.0–17.0)
Immature Granulocytes: 1 %
Lymphocytes Relative: 24 %
Lymphs Abs: 1.5 10*3/uL (ref 0.7–4.0)
MCH: 31.2 pg (ref 26.0–34.0)
MCHC: 32.7 g/dL (ref 30.0–36.0)
MCV: 95.2 fL (ref 80.0–100.0)
Monocytes Absolute: 0.9 10*3/uL (ref 0.1–1.0)
Monocytes Relative: 14 %
Neutro Abs: 3.8 10*3/uL (ref 1.7–7.7)
Neutrophils Relative %: 60 %
Platelets: 196 10*3/uL (ref 150–400)
RBC: 3.53 MIL/uL — ABNORMAL LOW (ref 4.22–5.81)
RDW: 13.9 % (ref 11.5–15.5)
WBC: 6.4 10*3/uL (ref 4.0–10.5)
nRBC: 0 % (ref 0.0–0.2)

## 2018-12-05 LAB — COMPREHENSIVE METABOLIC PANEL
ALT: 12 U/L (ref 0–44)
AST: 14 U/L — ABNORMAL LOW (ref 15–41)
Albumin: 3.7 g/dL (ref 3.5–5.0)
Alkaline Phosphatase: 41 U/L (ref 38–126)
Anion gap: 7 (ref 5–15)
BUN: 9 mg/dL (ref 8–23)
CO2: 29 mmol/L (ref 22–32)
Calcium: 9.3 mg/dL (ref 8.9–10.3)
Chloride: 100 mmol/L (ref 98–111)
Creatinine, Ser: 0.75 mg/dL (ref 0.61–1.24)
GFR calc Af Amer: >60 mL/min (ref >60)
GFR calc non Af Amer: >60 mL/min (ref >60)
Glucose, Bld: 109 mg/dL — ABNORMAL HIGH (ref 70–99)
Potassium: 3.6 mmol/L (ref 3.5–5.1)
Sodium: 136 mmol/L (ref 135–145)
Total Bilirubin: 0.5 mg/dL (ref 0.3–1.2)
Total Protein: 6.2 g/dL — ABNORMAL LOW (ref 6.5–8.1)

## 2018-12-05 NOTE — Progress Notes (Signed)
Patient is coming in for follow up he is doing well no complaints  

## 2018-12-06 ENCOUNTER — Inpatient Hospital Stay: Payer: 59

## 2018-12-06 ENCOUNTER — Encounter: Payer: Self-pay | Admitting: Internal Medicine

## 2018-12-06 ENCOUNTER — Other Ambulatory Visit: Payer: Self-pay

## 2018-12-06 ENCOUNTER — Inpatient Hospital Stay (HOSPITAL_BASED_OUTPATIENT_CLINIC_OR_DEPARTMENT_OTHER): Payer: 59 | Admitting: Internal Medicine

## 2018-12-06 VITALS — Resp 18

## 2018-12-06 DIAGNOSIS — Z7189 Other specified counseling: Secondary | ICD-10-CM

## 2018-12-06 DIAGNOSIS — C7951 Secondary malignant neoplasm of bone: Secondary | ICD-10-CM | POA: Diagnosis not present

## 2018-12-06 DIAGNOSIS — R5383 Other fatigue: Secondary | ICD-10-CM | POA: Diagnosis not present

## 2018-12-06 DIAGNOSIS — R519 Headache, unspecified: Secondary | ICD-10-CM | POA: Diagnosis not present

## 2018-12-06 DIAGNOSIS — G893 Neoplasm related pain (acute) (chronic): Secondary | ICD-10-CM

## 2018-12-06 DIAGNOSIS — C61 Malignant neoplasm of prostate: Secondary | ICD-10-CM

## 2018-12-06 DIAGNOSIS — Z79899 Other long term (current) drug therapy: Secondary | ICD-10-CM | POA: Diagnosis not present

## 2018-12-06 DIAGNOSIS — F329 Major depressive disorder, single episode, unspecified: Secondary | ICD-10-CM | POA: Diagnosis not present

## 2018-12-06 DIAGNOSIS — Z5111 Encounter for antineoplastic chemotherapy: Secondary | ICD-10-CM | POA: Diagnosis not present

## 2018-12-06 MED ORDER — FENTANYL 100 MCG/HR TD PT72: 1.0000 | MEDICATED_PATCH | TRANSDERMAL | 0 refills | Status: DC

## 2018-12-06 MED ORDER — SODIUM CHLORIDE 0.9 % IV SOLN
36.0000 mg/m2 | Freq: Once | INTRAVENOUS | Status: AC
  Administered 2018-12-06: 70 mg via INTRAVENOUS
  Filled 2018-12-06: qty 7

## 2018-12-06 MED ORDER — FENTANYL 25 MCG/HR TD PT72: 1.0000 | MEDICATED_PATCH | TRANSDERMAL | 0 refills | Status: DC

## 2018-12-06 MED ORDER — DEXAMETHASONE SODIUM PHOSPHATE 10 MG/ML IJ SOLN
10.0000 mg | Freq: Once | INTRAMUSCULAR | Status: AC
  Administered 2018-12-06: 10 mg via INTRAVENOUS
  Filled 2018-12-06: qty 1

## 2018-12-06 MED ORDER — SODIUM CHLORIDE 0.9 % IV SOLN
Freq: Once | INTRAVENOUS | Status: AC
  Administered 2018-12-06: 10:00:00 via INTRAVENOUS
  Filled 2018-12-06: qty 250

## 2018-12-06 NOTE — Assessment & Plan Note (Addendum)
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 Lupron every 3 months [10/04/2018]. Sep 2020; PSA- 190; rising. PET aux- 10/15- Progressive bone/ Left suprahilar-mediastinal LN. Currently, on Taxotere weekly. STABLE; PSA- 244/ slightly improvement.   # Proceed with Taxotere; Labs today reviewed;  acceptable for treatment today.  PSA from today pending.   #Epigastric discomfort -question secondary steroids /gastritis- Prilosec 20 mg BID prior to meals.   # Depression/ anxiety-  STABLE. Continue Remeron.   # Malignancy related pain- continue fentanyl patch hydrocodone;. Continue fentanyl patch to every 48 hours and continue hydrocodone. STABLE.  # fatigue- multifactorial-chemotherapy and leg malignancy polypharmacy.  Monitor for now.  # Headache-  [sec ? Zofran]; continue phenergan prn- improved.   # DISPOSITION:  # Chemo today # Follow-up visit in 1 weeks MD; cbc/cmp-labs in [place] 1 day prior/ Taxotere weekly-Dr. B

## 2018-12-06 NOTE — Patient Instructions (Signed)
#   Prilosec 20 mg BID prior to meals/empty stomach.

## 2018-12-06 NOTE — Progress Notes (Signed)
[facility] OFFICE PROGRESS NOTE  Patient Care Team: Olin Hauser, [name], [name], [name]  Cancer Staging No matching staging information was found for the patient.   Oncology History Overview Note  # 2011- PROSTATE CANCER [Gleason 3+4]; s/p Prostatectomy [ also involved bladder neck/ECP; Dr.Polaseck]; July 2014- Biochemical recurrence [PSA 14]- started on Zoladex [Dr.Pandit]; lost to follow up.  # JAN 2017- STAGE IV METASTATIC PROSTATE Cancer to Bone- Feb 13th, 2017-  Lupron q 86m[~end of feb]; PSA: 1021; Declined Chemo; April 2017 [xofigo x6; Dr.Crystal]; AUG 2017- Zytiga + Prednisone BID. Bone scan-Jan 2018- improved skeletal metastases.   # MAY 1st 2019- START X-tandi [stopped zytiga/PSA- 8.8/rising]  #August 23, 2018-stop Xtandi [intolerance-fatigue mental fogginess]  #Mid September 2020-apalutamide; stopped mid-October poor tolerance/progression  # Late Oct-Taxotere weekly  # Mets to bone- start X-geva q 24M [May 30th]; hold dental issues/infections  # Smoker/ chronic pain/pain clinic   #[April 2018; [place]]- s/p stenting; on Plavix; --------------------------------------------------------------  DIAGNOSIS: '[ ]'  Castrate resistant prostate cancer  STAGE:   4    ;GOALS: Palliative  CURRENT/MOST RECENT THERAPY-Late Oct 2020- Taxotere weekly    [place]  12/08/2014 Initial Diagnosis   [place] cancer [place] to bone ([facility]   11/01/2018 -  Chemotherapy   The patient had DOCEtaxel (TAXOTERE) 70 mg in sodium chloride 0.9 % 150 mL chemo infusion, 36 mg/m2 = 70 mg, Intravenous,  Once, 4 of 9 cycles Administration: 70 mg (11/08/2018), 70 mg (11/22/2018)  for chemotherapy treatment.     INTERVAL HISTORY:  Robert HEILMAN89y.o.  male pleasant patient above history of  metastatic castrate resistant prostate cancer currently -on Taxotere weekly is here for follow-up.  Patient has had 3 weekly treatment so far.  Last treatment was approximately 2 weeks ago-because of the holidays.  Headaches have improved since switching from Zofran to Phenergan.   Chronic mild nausea.  Diarrhea resolved.  Complains of epigastric discomfort.  Mild tingling and numbness in his hand and feet.  Not worse  Review of Systems  Constitutional: Positive for malaise/fatigue and weight loss. Negative for chills, diaphoresis and fever.  HENT: Negative for nosebleeds and sore throat.   Eyes: Negative for double vision.  Respiratory: Positive for shortness of breath. Negative for cough, hemoptysis, sputum production and wheezing.   Cardiovascular: Negative for chest pain, palpitations, orthopnea and leg swelling.  Gastrointestinal: Negative for abdominal pain, blood in stool, diarrhea, heartburn, melena, nausea and vomiting.  Genitourinary: Negative for dysuria, frequency and urgency.  Musculoskeletal: Positive for back pain and joint pain.  Skin: Negative.  Negative for itching and rash.  Neurological: Negative for dizziness, tingling, focal weakness, weakness and headaches.  Endo/Heme/Allergies: Does not bruise/bleed easily.  Psychiatric/Behavioral: Negative for depression. The patient is not nervous/anxious and does not have insomnia.       PAST MEDICAL HISTORY :  Past Medical History:  Diagnosis Date  . Back pain 10/09/2012  . Bone cancer ([facility]   . CAD (coronary artery disease)    a. 08/2015 PCI: LM nl, LAD 20d, LCX nl, RCA 261mRPAV 95 (3.0x18 Xience Alpine DES);  b. [facility] (2.75x8 Promus Premier DES).  . Cancer associated pain   . Depression   . History of echocardiogram    a. 08/2016 Echo: EF 50-55%.  . Marland Kitchenistory of kidney stones   .  Hyperlipidemia   . Hypertension   . Joint pain   . Prostate cancer [facility])    a.  s/p prostatectomy ([facility]);  b. bone  mets noted 04/2016.  . Right arm pain 01/10/2016  . Right foot pain 01/10/2016  . Right leg pain 01/10/2016  . Tobacco abuse     PAST SURGICAL HISTORY :   Past Surgical History:  Procedure Laterality Date  . CARDIAC CATHETERIZATION     [facility]  . CARDIAC CATHETERIZATION N/A 08/16/2015   Procedure: Left Heart Cath and Coronary Angiography;  Surgeon: Yolonda Kida, MD;  Location: [place] [facility] LAB;  Service: Cardiovascular;  Laterality: N/A;  . CARDIAC CATHETERIZATION N/A 08/16/2015   Procedure: Coronary Stent Intervention;  Surgeon: Yolonda Kida, MD;  Location: [place] CV LAB;  Service: Cardiovascular;  Laterality: N/A;  . CERVIX SURGERY    . PROSTATECTOMY    . SPINE SURGERY    . TONSILLECTOMY    . WRIST SURGERY      FAMILY HISTORY :   Family History  Problem Relation Age of Onset  . Heart attack Mother   . Hypertension Mother   . Heart attack Father     SOCIAL HISTORY:   Social History   Tobacco Use  . Smoking status: Current Every Day Smoker    Packs/day: 0.50    Years: 40.00    Pack years: 20.00    Types: Cigarettes  . Smokeless tobacco: Current User    Types: Chew  . Tobacco comment: 1 pack every couple days   Substance Use Topics  . Alcohol use: Yes    Frequency: Never    Comment: occasionally  . Drug use: No    ALLERGIES:  is allergic to ditropan [oxybutynin].  MEDICATIONS:  Current Outpatient Medications  Medication Sig Dispense Refill  . acetaminophen (TYLENOL) 500 MG tablet Take 1,000 mg by mouth every 8 (eight) hours as needed.     Marland Kitchen albuterol (PROVENTIL HFA;VENTOLIN HFA) 108 (90 Base) MCG/ACT inhaler Inhale 1-2 puffs into the lungs every 6 (six) hours as needed for wheezing or shortness of breath. 1 Inhaler 0  . aspirin 81 MG tablet Take 81 mg by mouth daily.    Marland Kitchen atorvastatin (LIPITOR) 80 MG tablet TAKE 1 TABLET BY MOUTH DAILY AT 6 PM. 90 tablet 3  . chlorhexidine (PERIDEX) 0.12 % solution     . citalopram (CELEXA) 20 MG tablet Take 1  tablet (20 mg total) by mouth daily. 30 tablet 2  . clopidogrel (PLAVIX) 75 MG tablet TAKE 1 TABLET BY MOUTH DAILY 90 tablet 0  . dexamethasone (DECADRON) 4 MG tablet Take one pill AM & PM x 3 days; start the day prior to chemo. 60 tablet 1  . fentaNYL (DURAGESIC) 100 MCG/HR Place 1 patch onto the skin every other day. Along with 16mg patch for total of 125 mcg every 2 days. 15 patch 0  . fentaNYL (DURAGESIC) 25 MCG/HR Place 1 patch onto the skin every other day. Use this along with fentanyl patch 100 mcg [total 125 mcg] every 2 days 15 patch 0  . gabapentin (NEURONTIN) 100 MG capsule START 1 CAPSULE DAILY, INCREASE BY 1 CAP EVERY 2 TO 3 DAYS AS TOLERATED UP TO 3 TIMES A DAY, OR MAY TAKE 3 AT ONCE IN [place] 90 capsule 5  . magnesium hydroxide (MILK OF MAGNESIA) 400 MG/5ML suspension Take 5 mLs by mouth daily as needed for constipation.    . metoprolol tartrate (LOPRESSOR) 25 MG tablet TAKE  1/2 TABLET BY MOUTH 2 TIMES DAILY. 90 tablet 1  . naloxone (NARCAN) nasal spray 4 mg/0.1 mL 1 spray into nostril upon signs of opioid overdose. Call 911. May repeat once if no response within 2-3 minutes. 1 kit 0  . nitroGLYCERIN (NITROSTAT) 0.4 MG SL tablet Place 0.4 mg under the tongue every 5 (five) minutes as needed.     . ondansetron (ZOFRAN) 8 MG tablet One pill every 8 hours as needed for nausea/vomitting. 40 tablet 1  . oxyCODONE (ROXICODONE) 15 MG immediate release tablet Take 1 tablet (15 mg total) by mouth every 4 (four) hours as needed for pain. 90 tablet 0  . polyethylene glycol (MIRALAX / GLYCOLAX) packet Take 17 g by mouth daily as needed for moderate constipation.    . predniSONE (DELTASONE) 5 MG tablet TAKE 1 TABLET BY MOUTH DAILY WITH BREAKFAST. 30 tablet 1  . promethazine (PHENERGAN) 25 MG tablet TAKE 1/2 TO 1 TABLET (12.5-25 MG TOTAL) BY MOUTH EVERY 8 (EIGHT) HOURS AS NEEDED FOR NAUSEA OR VOMITING. 30 tablet 3  . traZODone (DESYREL) 50 MG tablet Take 1-2 tablets (50-100 mg total) by mouth at  bedtime as needed for sleep. 60 tablet 2  . Wheat Dextrin (BENEFIBER) POWD Stir 2 tsp. TID into 4-8 oz of any non-carbonated beverage or soft food (hot or cold) 500 g PRN   No current facility-administered medications for this visit.    Facility-Administered Medications Ordered in Other Visits  Medication Dose Route Frequency Provider Last Rate Last Dose  . DOCEtaxel (TAXOTERE) 70 mg in sodium chloride 0.9 % 150 mL chemo infusion  36 mg/m2 (Treatment Plan Recorded) Intravenous Once [name], MD 157 mL/hr at 12/06/18 1040 70 mg at 12/06/18 1040    PHYSICAL EXAMINATION: ECOG PERFORMANCE STATUS: 1 - Symptomatic but completely ambulatory  BP (!) 147/90 (BP Location: Left Arm, Patient Position: Sitting, Cuff Size: Normal)   Pulse 94   Temp (!) 96.8 F (36 C) (Tympanic)   Wt 162 lb (73.5 kg)   BMI 22.59 kg/m   Filed Weights   12/06/18 0901  Weight: 162 lb (73.5 kg)    Physical Exam  Constitutional: He is oriented to person, place, and time.   Frail-appearing Caucasian male patient.  He is walking with a cane.    HENT:  Head: Normocephalic and atraumatic.  Mouth/Throat: Oropharynx is clear and moist. No oropharyngeal exudate.  Poor dentition.  Eyes: Pupils are equal, round, and reactive to light.  Neck: Normal range of motion. Neck supple.  Cardiovascular: Normal rate and regular rhythm.  Pulmonary/Chest: No respiratory distress. He has no wheezes.  Decreased breath sounds bilaterally at the bases.  No wheeze or crackles  Abdominal: Soft. Bowel sounds are normal. He exhibits no distension and no mass. There is no abdominal tenderness. There is no rebound and no guarding.  Musculoskeletal: Normal range of motion.        General: No tenderness or edema.  Neurological: He is alert and oriented to person, place, and time.  Skin: Skin is warm.  Psychiatric: Affect normal.    LABORATORY DATA:  I have reviewed the data as listed    Component Value Date/Time   NA 136  12/05/2018 1354   NA 140 07/13/2013 1542   K 3.6 12/05/2018 1354   K 4.3 07/13/2013 1542   CL 100 12/05/2018 1354   CL 107 07/13/2013 1542   CO2 29 12/05/2018 1354   CO2 26 07/13/2013 1542   GLUCOSE 109 (H) 12/05/2018  1354   GLUCOSE 106 (H) 07/13/2013 1542   BUN 9 12/05/2018 1354   BUN 11 07/13/2013 1542   CREATININE 0.75 12/05/2018 1354   CREATININE 0.76 10/15/2017 1001   CALCIUM 9.3 12/05/2018 1354   CALCIUM 9.9 07/13/2013 1542   PROT 6.2 (L) 12/05/2018 1354   PROT 8.1 07/13/2013 1542   ALBUMIN 3.7 12/05/2018 1354   ALBUMIN 4.3 07/13/2013 1542   AST 14 (L) 12/05/2018 1354   AST 26 07/13/2013 1542   ALT 12 12/05/2018 1354   ALT 32 07/13/2013 1542   ALKPHOS 41 12/05/2018 1354   ALKPHOS 74 07/13/2013 1542   BILITOT 0.5 12/05/2018 1354   BILITOT 0.4 07/13/2013 1542   GFRNONAA >60 12/05/2018 1354   GFRNONAA 95 10/15/2017 1001   GFRAA >60 12/05/2018 1354   GFRAA 110 10/15/2017 1001    No results found for: SPEP, UPEP  Lab Results  Component Value Date   WBC 6.4 12/05/2018   NEUTROABS 3.8 12/05/2018   HGB 11.0 (L) 12/05/2018   HCT 33.6 (L) 12/05/2018   MCV 95.2 12/05/2018   PLT 196 12/05/2018      Chemistry      Component Value Date/Time   NA 136 12/05/2018 1354   NA 140 07/13/2013 1542   K 3.6 12/05/2018 1354   K 4.3 07/13/2013 1542   CL 100 12/05/2018 1354   CL 107 07/13/2013 1542   CO2 29 12/05/2018 1354   CO2 26 07/13/2013 1542   BUN 9 12/05/2018 1354   BUN 11 07/13/2013 1542   CREATININE 0.75 12/05/2018 1354   CREATININE 0.76 10/15/2017 1001      Component Value Date/Time   CALCIUM 9.3 12/05/2018 1354   CALCIUM 9.9 07/13/2013 1542   ALKPHOS 41 12/05/2018 1354   ALKPHOS 74 07/13/2013 1542   AST 14 (L) 12/05/2018 1354   AST 26 07/13/2013 1542   ALT 12 12/05/2018 1354   ALT 32 07/13/2013 1542   BILITOT 0.5 12/05/2018 1354   BILITOT 0.4 07/13/2013 1542       RADIOGRAPHIC STUDIES: I have personally reviewed the radiological images as listed and  agreed with the findings in the report. No results found.   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 Lupron every 3 months [10/04/2018]. Sep 2020; PSA- 190; rising. PET aux- 10/15- Progressive bone/ Left suprahilar-mediastinal LN. Currently, on Taxotere weekly. STABLE; PSA- 244/ slightly improvement.   # Proceed with Taxotere; Labs today reviewed;  acceptable for treatment today.  PSA from today pending.   #Epigastric discomfort -question secondary steroids /gastritis- Prilosec 20 mg BID prior to meals.   # Depression/ anxiety-  STABLE. Continue Remeron.   # Malignancy related pain- continue fentanyl patch hydrocodone;. Continue fentanyl patch to every 48 hours and continue hydrocodone. STABLE.  # fatigue- multifactorial-chemotherapy and leg malignancy polypharmacy.  Monitor for now.  # Headache-  [sec ? Zofran]; continue phenergan prn- improved.   # DISPOSITION:  # Chemo today # Follow-up visit in 1 weeks MD; cbc/cmp-labs in [place] 1 day prior/ Taxotere weekly-Dr. B      Orders Placed This Encounter  Procedures  . CBC with Differential    Standing Status:   Future    Standing Expiration Date:   12/06/2019  . Comprehensive metabolic panel    Standing Status:   Future    Standing Expiration Date:   12/06/2019   All questions were answered. The patient knows to call the clinic  with any problems, questions or concerns.      [name], MD 12/06/2018 11:03 AM

## 2018-12-11 ENCOUNTER — Other Ambulatory Visit: Payer: Self-pay

## 2018-12-12 ENCOUNTER — Other Ambulatory Visit: Payer: Self-pay

## 2018-12-12 ENCOUNTER — Other Ambulatory Visit: Payer: 59

## 2018-12-12 ENCOUNTER — Inpatient Hospital Stay: Payer: 59

## 2018-12-12 DIAGNOSIS — C7951 Secondary malignant neoplasm of bone: Secondary | ICD-10-CM

## 2018-12-12 DIAGNOSIS — R5383 Other fatigue: Secondary | ICD-10-CM | POA: Diagnosis not present

## 2018-12-12 DIAGNOSIS — R519 Headache, unspecified: Secondary | ICD-10-CM | POA: Diagnosis not present

## 2018-12-12 DIAGNOSIS — Z79899 Other long term (current) drug therapy: Secondary | ICD-10-CM | POA: Diagnosis not present

## 2018-12-12 DIAGNOSIS — C61 Malignant neoplasm of prostate: Secondary | ICD-10-CM | POA: Diagnosis not present

## 2018-12-12 DIAGNOSIS — Z5111 Encounter for antineoplastic chemotherapy: Secondary | ICD-10-CM | POA: Diagnosis not present

## 2018-12-12 DIAGNOSIS — F329 Major depressive disorder, single episode, unspecified: Secondary | ICD-10-CM | POA: Diagnosis not present

## 2018-12-12 LAB — CBC WITH DIFFERENTIAL/PLATELET
Abs Immature Granulocytes: 0.03 10*3/uL (ref 0.00–0.07)
Basophils Absolute: 0 10*3/uL (ref 0.0–0.1)
Basophils Relative: 0 %
Eosinophils Absolute: 0 10*3/uL (ref 0.0–0.5)
Eosinophils Relative: 0 %
HCT: 35.4 % — ABNORMAL LOW (ref 39.0–52.0)
Hemoglobin: 11.6 g/dL — ABNORMAL LOW (ref 13.0–17.0)
Immature Granulocytes: 1 %
Lymphocytes Relative: 30 %
Lymphs Abs: 1.5 10*3/uL (ref 0.7–4.0)
MCH: 31.4 pg (ref 26.0–34.0)
MCHC: 32.8 g/dL (ref 30.0–36.0)
MCV: 95.7 fL (ref 80.0–100.0)
Monocytes Absolute: 0.3 10*3/uL (ref 0.1–1.0)
Monocytes Relative: 6 %
Neutro Abs: 3.1 10*3/uL (ref 1.7–7.7)
Neutrophils Relative %: 63 %
Platelets: 187 10*3/uL (ref 150–400)
RBC: 3.7 MIL/uL — ABNORMAL LOW (ref 4.22–5.81)
RDW: 13.8 % (ref 11.5–15.5)
WBC: 5 10*3/uL (ref 4.0–10.5)
nRBC: 0 % (ref 0.0–0.2)

## 2018-12-12 LAB — COMPREHENSIVE METABOLIC PANEL
ALT: 12 U/L (ref 0–44)
AST: 12 U/L — ABNORMAL LOW (ref 15–41)
Albumin: 3.5 g/dL (ref 3.5–5.0)
Alkaline Phosphatase: 41 U/L (ref 38–126)
Anion gap: 9 (ref 5–15)
BUN: 10 mg/dL (ref 8–23)
CO2: 26 mmol/L (ref 22–32)
Calcium: 9.2 mg/dL (ref 8.9–10.3)
Chloride: 103 mmol/L (ref 98–111)
Creatinine, Ser: 0.77 mg/dL (ref 0.61–1.24)
GFR calc Af Amer: >60 mL/min (ref >60)
GFR calc non Af Amer: >60 mL/min (ref >60)
Glucose, Bld: 66 mg/dL — ABNORMAL LOW (ref 70–99)
Potassium: 4 mmol/L (ref 3.5–5.1)
Sodium: 138 mmol/L (ref 135–145)
Total Bilirubin: 0.6 mg/dL (ref 0.3–1.2)
Total Protein: 6 g/dL — ABNORMAL LOW (ref 6.5–8.1)

## 2018-12-13 ENCOUNTER — Other Ambulatory Visit: Payer: Self-pay

## 2018-12-13 ENCOUNTER — Inpatient Hospital Stay: Payer: 59

## 2018-12-13 ENCOUNTER — Inpatient Hospital Stay (HOSPITAL_BASED_OUTPATIENT_CLINIC_OR_DEPARTMENT_OTHER): Payer: 59 | Admitting: Internal Medicine

## 2018-12-13 VITALS — BP 129/76 | HR 93 | Resp 18

## 2018-12-13 DIAGNOSIS — C61 Malignant neoplasm of prostate: Secondary | ICD-10-CM

## 2018-12-13 DIAGNOSIS — Z7189 Other specified counseling: Secondary | ICD-10-CM

## 2018-12-13 DIAGNOSIS — C7951 Secondary malignant neoplasm of bone: Secondary | ICD-10-CM | POA: Diagnosis not present

## 2018-12-13 DIAGNOSIS — Z79899 Other long term (current) drug therapy: Secondary | ICD-10-CM | POA: Diagnosis not present

## 2018-12-13 DIAGNOSIS — F329 Major depressive disorder, single episode, unspecified: Secondary | ICD-10-CM | POA: Diagnosis not present

## 2018-12-13 DIAGNOSIS — R519 Headache, unspecified: Secondary | ICD-10-CM | POA: Diagnosis not present

## 2018-12-13 DIAGNOSIS — Z5111 Encounter for antineoplastic chemotherapy: Secondary | ICD-10-CM | POA: Diagnosis not present

## 2018-12-13 DIAGNOSIS — R5383 Other fatigue: Secondary | ICD-10-CM | POA: Diagnosis not present

## 2018-12-13 MED ORDER — SODIUM CHLORIDE 0.9 % IV SOLN
36.0000 mg/m2 | Freq: Once | INTRAVENOUS | Status: AC
  Administered 2018-12-13: 10:00:00 70 mg via INTRAVENOUS
  Filled 2018-12-13: qty 7

## 2018-12-13 MED ORDER — DEXAMETHASONE SODIUM PHOSPHATE 10 MG/ML IJ SOLN
10.0000 mg | Freq: Once | INTRAMUSCULAR | Status: AC
  Administered 2018-12-13: 10:00:00 10 mg via INTRAVENOUS
  Filled 2018-12-13: qty 1

## 2018-12-13 MED ORDER — SODIUM CHLORIDE 0.9 % IV SOLN
Freq: Once | INTRAVENOUS | Status: AC
  Administered 2018-12-13: 10:00:00 via INTRAVENOUS
  Filled 2018-12-13: qty 250

## 2018-12-13 MED ORDER — OXYCODONE HCL 15 MG PO TABS: 15.0000 mg | ORAL_TABLET | Freq: Four times a day (QID) | ORAL | 0 refills | Status: DC | PRN

## 2018-12-13 NOTE — Progress Notes (Signed)
[name] OFFICE PROGRESS NOTE  Patient Care Team: Olin Hauser, [name], [name], [name]  Cancer Staging No matching staging information was found for the patient.   Oncology History Overview Note  # 2011- PROSTATE CANCER [Gleason 3+4]; s/p Prostatectomy [ also involved bladder neck/ECP; Dr.Polaseck]; July 2014- Biochemical recurrence [PSA 14]- started on Zoladex [Dr.Pandit]; lost to follow up.  # JAN 2017- STAGE IV METASTATIC PROSTATE Cancer to Bone- Feb 13th, 2017-  Lupron q 37m[~end of feb]; PSA: 1021; Declined Chemo; April 2017 [xofigo x6; Dr.Crystal]; AUG 2017- Zytiga + Prednisone BID. Bone scan-Jan 2018- improved skeletal metastases.   # MAY 1st 2019- START X-tandi [stopped zytiga/PSA- 8.8/rising]  #August 23, 2018-stop Xtandi [intolerance-fatigue mental fogginess]  #Mid September 2020-apalutamide; stopped mid-October poor tolerance/progression  # 11/01/2018- -Taxotere weekly [consent]  # Mets to bone- start X-geva q 32M [May 30th]; hold dental issues/infections  # Smoker/ chronic pain/pain clinic   #[April 2018; [place]]- s/p stenting; on Plavix; --------------------------------------------------------------  DIAGNOSIS: [ ] Castrate resistant prostate cancer  STAGE:   4    ;GOALS: Palliative  CURRENT/MOST RECENT THERAPY-Late Oct 2020- Taxotere weekly    [facility]  12/08/2014 Initial Diagnosis   Pro[place] cancer metastatic to bone ([facility]   11/01/2018 -  Chemotherapy   The patient had DOCEtaxel (TAXOTERE) 70 mg in sodium chloride 0.9 % 150 mL chemo infusion, 36 mg/m2 = 70 mg, Intravenous,  Once, 4 of 9 cycles Administration: 70 mg (11/08/2018), 70 mg (11/22/2018), 70 mg (12/06/2018)  for chemotherapy treatment.     INTERVAL HISTORY:  Robert [id number].  male  pleasant patient above history of metastatic castrate resistant prostate cancer currently -on Taxotere weekly is here for follow-up.  Patient has had 4 weekly treatment so far.   Patient's epigastric discomfort has improved.  Currently taking Prilosec.  Continues to have chronic fatigue.  Chronic back pain joint pains.  He is currently narcotic pain medication.-Fentanyl patch oxycodone as needed Review of Systems  Constitutional: Positive for malaise/fatigue and weight loss. Negative for chills, diaphoresis and fever.  HENT: Negative for nosebleeds and sore throat.   Eyes: Negative for double vision.  Respiratory: Negative for cough, hemoptysis, sputum production and wheezing.   Cardiovascular: Negative for chest pain, palpitations, orthopnea and leg swelling.  Gastrointestinal: Negative for abdominal pain, blood in stool, diarrhea, heartburn, melena, nausea and vomiting.  Genitourinary: Negative for dysuria, frequency and urgency.  Musculoskeletal: Positive for back pain and joint pain.  Skin: Negative.  Negative for itching and rash.  Neurological: Negative for dizziness, tingling, focal weakness, weakness and headaches.  Endo/Heme/Allergies: Does not bruise/bleed easily.  Psychiatric/Behavioral: Negative for depression. The patient is not nervous/anxious and does not have insomnia.       PAST MEDICAL HISTORY :  Past Medical History:  Diagnosis Date  . Back pain 10/09/2012  . Bone cancer ([facility]   . CAD (coronary artery disease)    a. 08/2015 PCI: LM nl, LAD 20d, LCX nl, RCA 227mRPAV 95 (3.0x18 Xience Alpine DES);  [facility]2.75[facility]8 Promus Premier DES).  . Cancer associated pain   . Depression   . History of echocardiogram    a. 08/2016 Echo: EF 50-55%.  . Marland Kitchenistory of kidney stones   . Hyperlipidemia   . Hypertension   . Joint pain   .  Prostate cancer [facility])    a.  s/p prostatectomy ([facility]);  b. bone mets noted 04/2016.  . Right arm pain 01/10/2016  . Right  foot pain 01/10/2016  . Right leg pain 01/10/2016  . Tobacco abuse     PAST SURGICAL HISTORY :   Past Surgical History:  Procedure Laterality Date  . CARDIAC CATHETERIZATION     [facility]  . CARDIAC CATHETERIZATION N/A 08/16/2015   Procedure: Left Heart Cath and Coronary Angiography;  Surgeon: Yolonda Kida, MD;  Location: [facility];  Service: Cardiovascular;  Laterality: N/A;  . CARDIAC CATHETERIZATION N/A 08/16/2015   Procedure: Coronary Stent Intervention;  Surgeon: Yolonda Kida, MD;  Location: [facility];  Service: Cardiovascular;  Laterality: N/A;  . CERVIX SURGERY    . PROSTATECTOMY    . SPINE SURGERY    . TONSILLECTOMY    . WRIST SURGERY      FAMILY HISTORY :   Family History  Problem Relation Age of Onset  . Heart attack Mother   . Hypertension Mother   . Heart attack Father     SOCIAL HISTORY:   Social History   Tobacco Use  . Smoking status: Current Every Day Smoker    Packs/day: 0.50    Years: 40.00    Pack years: 20.00    Types: Cigarettes  . Smokeless tobacco: Current User    Types: Chew  . Tobacco comment: 1 pack every couple days   Substance Use Topics  . Alcohol use: Yes    Comment: occasionally  . Drug use: No    ALLERGIES:  is allergic to ditropan [oxybutynin].  MEDICATIONS:  Current Outpatient Medications  Medication Sig Dispense Refill  . acetaminophen (TYLENOL) 500 MG tablet Take 1,000 mg by mouth every 8 (eight) hours as needed.     Marland Kitchen albuterol (PROVENTIL HFA;VENTOLIN HFA) 108 (90 Base) MCG/ACT inhaler Inhale 1-2 puffs into the lungs every 6 (six) hours as needed for wheezing or shortness of breath. 1 Inhaler 0  . aspirin 81 MG tablet Take 81 mg by mouth daily.    Marland Kitchen atorvastatin (LIPITOR) 80 MG tablet TAKE 1 TABLET BY MOUTH DAILY AT 6 PM. 90 tablet 3  . chlorhexidine (PERIDEX) 0.12 % solution     . citalopram (CELEXA) 20 MG tablet Take 1 tablet (20 mg total) by mouth daily. 30 tablet 2  . clopidogrel (PLAVIX) 75 MG  tablet TAKE 1 TABLET BY MOUTH DAILY 90 tablet 0  . dexamethasone (DECADRON) 4 MG tablet Take one pill AM & PM x 3 days; start the day prior to chemo. 60 tablet 1  . fentaNYL (DURAGESIC) 100 MCG/HR Place 1 patch onto the skin every other day. Along with 30mg patch for total of 125 mcg every 2 days. 15 patch 0  . fentaNYL (DURAGESIC) 25 MCG/HR Place 1 patch onto the skin every other day. Use this along with fentanyl patch 100 mcg [total 125 mcg] every 2 days 15 patch 0  . gabapentin (NEURONTIN) 100 MG capsule START 1 CAPSULE DAILY, INCREASE BY 1 CAP EVERY 2 TO 3 DAYS AS TOLERATED UP TO 3 TIMES A DAY, OR MAY TAKE 3 AT ONCE IN [place] 90 capsule 5  . magnesium hydroxide (MILK OF MAGNESIA) 400 MG/5ML suspension Take 5 mLs by mouth daily as needed for constipation.    . metoprolol tartrate (LOPRESSOR) 25 MG tablet TAKE 1/2 TABLET BY MOUTH 2 TIMES DAILY. 90 tablet 1  . naloxone (NARCAN) nasal spray 4 mg/0.1  mL 1 spray into nostril upon signs of opioid overdose. Call 911. May repeat once if no response within 2-3 minutes. 1 kit 0  . nitroGLYCERIN (NITROSTAT) 0.4 MG SL tablet Place 0.4 mg under the tongue every 5 (five) minutes as needed.     . ondansetron (ZOFRAN) 8 MG tablet One pill every 8 hours as needed for nausea/vomitting. 40 tablet 1  . oxyCODONE (ROXICODONE) 15 MG immediate release tablet Take 1 tablet (15 mg total) by mouth every 6 (six) hours as needed for pain. 90 tablet 0  . polyethylene glycol (MIRALAX / GLYCOLAX) packet Take 17 g by mouth daily as needed for moderate constipation.    . predniSONE (DELTASONE) 5 MG tablet TAKE 1 TABLET BY MOUTH DAILY WITH BREAKFAST. 30 tablet 1  . promethazine (PHENERGAN) 25 MG tablet TAKE 1/2 TO 1 TABLET (12.5-25 MG TOTAL) BY MOUTH EVERY 8 (EIGHT) HOURS AS NEEDED FOR NAUSEA OR VOMITING. 30 tablet 3  . traZODone (DESYREL) 50 MG tablet Take 1-2 tablets (50-100 mg total) by mouth at bedtime as needed for sleep. 60 tablet 2  . Wheat Dextrin (BENEFIBER) POWD Stir 2  tsp. TID into 4-8 oz of any non-carbonated beverage or soft food (hot or cold) 500 g PRN   No current facility-administered medications for this visit.    PHYSICAL EXAMINATION: ECOG PERFORMANCE STATUS: 1 - Symptomatic but completely ambulatory  BP (!) 151/91 (BP Location: Left Arm, Patient Position: Sitting, Cuff Size: Normal)   Pulse (!) 106   Temp 98.1 F (36.7 C) (Tympanic)   Resp 16   Wt 159 lb 3.2 oz (72.2 kg)   BMI 22.20 kg/m   Filed Weights   12/13/18 0910  Weight: 159 lb 3.2 oz (72.2 kg)    Physical Exam  Constitutional: He is oriented to person, place, and time.   Frail-appearing Caucasian male patient.  He is walking with a cane.    HENT:  Head: Normocephalic and atraumatic.  Mouth/Throat: Oropharynx is clear and moist. No oropharyngeal exudate.  Poor dentition.  Eyes: Pupils are equal, round, and reactive to light.  Cardiovascular: Normal rate and regular rhythm.  Pulmonary/Chest: No respiratory distress. He has no wheezes.  Decreased breath sounds bilaterally at the bases.  No wheeze or crackles  Abdominal: Soft. Bowel sounds are normal. He exhibits no distension and no mass. There is no abdominal tenderness. There is no rebound and no guarding.  Musculoskeletal:        General: No tenderness or edema. Normal range of motion.     Cervical back: Normal range of motion and neck supple.  Neurological: He is alert and oriented to person, place, and time.  Skin: Skin is warm.  Psychiatric: Affect normal.    LABORATORY DATA:  I have reviewed the data as listed    Component Value Date/Time   NA 138 12/12/2018 1325   NA 140 07/13/2013 1542   K 4.0 12/12/2018 1325   K 4.3 07/13/2013 1542   CL 103 12/12/2018 1325   CL 107 07/13/2013 1542   CO2 26 12/12/2018 1325   CO2 26 07/13/2013 1542   GLUCOSE 66 (L) 12/12/2018 1325   GLUCOSE 106 (H) 07/13/2013 1542   BUN 10 12/12/2018 1325   BUN 11 07/13/2013 1542   CREATININE 0.77 12/12/2018 1325   CREATININE 0.76  10/15/2017 1001   CALCIUM 9.2 12/12/2018 1325   CALCIUM 9.9 07/13/2013 1542   PROT 6.0 (L) 12/12/2018 1325   PROT 8.1 07/13/2013 1542   ALBUMIN 3.5  12/12/2018 1325   ALBUMIN 4.3 07/13/2013 1542   AST 12 (L) 12/12/2018 1325   AST 26 07/13/2013 1542   ALT 12 12/12/2018 1325   ALT 32 07/13/2013 1542   ALKPHOS 41 12/12/2018 1325   ALKPHOS 74 07/13/2013 1542   BILITOT 0.6 12/12/2018 1325   BILITOT 0.4 07/13/2013 1542   GFRNONAA >60 12/12/2018 1325   GFRNONAA 95 10/15/2017 1001   GFRAA >60 12/12/2018 1325   GFRAA 110 10/15/2017 1001    No results found for: SPEP, UPEP  Lab Results  Component Value Date   WBC 5.0 12/12/2018   NEUTROABS 3.1 12/12/2018   HGB 11.6 (L) 12/12/2018   HCT 35.4 (L) 12/12/2018   MCV 95.7 12/12/2018   PLT 187 12/12/2018      Chemistry      Component Value Date/Time   NA 138 12/12/2018 1325   NA 140 07/13/2013 1542   K 4.0 12/12/2018 1325   K 4.3 07/13/2013 1542   CL 103 12/12/2018 1325   CL 107 07/13/2013 1542   CO2 26 12/12/2018 1325   CO2 26 07/13/2013 1542   BUN 10 12/12/2018 1325   BUN 11 07/13/2013 1542   CREATININE 0.77 12/12/2018 1325   CREATININE 0.76 10/15/2017 1001      Component Value Date/Time   CALCIUM 9.2 12/12/2018 1325   CALCIUM 9.9 07/13/2013 1542   ALKPHOS 41 12/12/2018 1325   ALKPHOS 74 07/13/2013 1542   AST 12 (L) 12/12/2018 1325   AST 26 07/13/2013 1542   ALT 12 12/12/2018 1325   ALT 32 07/13/2013 1542   BILITOT 0.6 12/12/2018 1325   BILITOT 0.4 07/13/2013 1542       RADIOGRAPHIC STUDIES: I have personally reviewed the radiological images as listed and agreed with the findings in the report. No results found.   ASSESSMENT & PLAN:  Prostate cancer metastatic to bone ([place]) # Castrate resistant prostate cancer metastases to bone. Lupron every 3 months [10/04/2018]. Sep 2020; PSA- 190; rising. PET aux- 10/15- Progressive bone/ Left suprahilar-mediastinal LN. Currently, on Taxotere weekly. STABLE; PSA- 244/  slightly improvement.   # Proceed with Taxotere; Labs today reviewed;  acceptable for treatment today.  #Epigastric discomfort-? steroids /gastritis- Prilosec 20 mg BID prior to meals.  STABLE. Stop dex.   # Depression/ anxiety-  STABLE. Continue Remeron.   # Malignancy related pain- continue fentanyl patch hydrocodone;. Continue fentanyl patch to every 48 hours and continue hydrocodone. STABLE.  # fatigue- multifactorial-chemotherapy and leg malignancy polypharmacy.  Monitor for now. Stable.   # DISPOSITION:  # Chemo today # Follow-up visit in 1 weeks MD; cbc/cmp'PSA-labs in [place] 1 day prior/ Taxotere weekly-Dr. B      No orders of the defined types were placed in this encounter.  All questions were answered. The patient knows to call the clinic with any problems, questions or concerns.      [name], MD 12/13/2018 9:39 AM

## 2018-12-13 NOTE — Assessment & Plan Note (Addendum)
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 Lupron every 3 months [10/04/2018]. Sep 2020; PSA- 190; rising. PET aux- 10/15- Progressive bone/ Left suprahilar-mediastinal LN. Currently, on Taxotere weekly. STABLE; PSA- 244/ slightly improvement.   # Proceed with Taxotere; Labs today reviewed;  acceptable for treatment today.  #Epigastric discomfort-? steroids /gastritis- Prilosec 20 mg BID prior to meals.  STABLE. Stop dex.   # Depression/ anxiety-  STABLE. Continue Remeron.   # Malignancy related pain- continue fentanyl patch hydrocodone;. Continue fentanyl patch to every 48 hours and continue hydrocodone. STABLE.  # fatigue- multifactorial-chemotherapy and leg malignancy polypharmacy.  Monitor for now. Stable.   # DISPOSITION:  # Chemo today # Follow-up visit in 1 weeks MD; cbc/cmp'PSA-labs in [place] 1 day prior/ Taxotere weekly-Dr. B

## 2018-12-18 ENCOUNTER — Other Ambulatory Visit: Payer: Self-pay

## 2018-12-18 ENCOUNTER — Inpatient Hospital Stay: Payer: 59

## 2018-12-18 DIAGNOSIS — C61 Malignant neoplasm of prostate: Secondary | ICD-10-CM

## 2018-12-19 ENCOUNTER — Encounter: Payer: Self-pay | Admitting: Internal Medicine

## 2018-12-19 ENCOUNTER — Inpatient Hospital Stay: Payer: 59

## 2018-12-19 ENCOUNTER — Other Ambulatory Visit: Payer: Self-pay

## 2018-12-19 DIAGNOSIS — C7951 Secondary malignant neoplasm of bone: Secondary | ICD-10-CM | POA: Diagnosis not present

## 2018-12-19 DIAGNOSIS — F329 Major depressive disorder, single episode, unspecified: Secondary | ICD-10-CM | POA: Diagnosis not present

## 2018-12-19 DIAGNOSIS — Z5111 Encounter for antineoplastic chemotherapy: Secondary | ICD-10-CM | POA: Diagnosis not present

## 2018-12-19 DIAGNOSIS — C61 Malignant neoplasm of prostate: Secondary | ICD-10-CM

## 2018-12-19 DIAGNOSIS — R519 Headache, unspecified: Secondary | ICD-10-CM | POA: Diagnosis not present

## 2018-12-19 DIAGNOSIS — Z79899 Other long term (current) drug therapy: Secondary | ICD-10-CM | POA: Diagnosis not present

## 2018-12-19 DIAGNOSIS — R5383 Other fatigue: Secondary | ICD-10-CM | POA: Diagnosis not present

## 2018-12-19 LAB — CBC WITH DIFFERENTIAL/PLATELET
Abs Immature Granulocytes: 0.01 10*3/uL (ref 0.00–0.07)
Basophils Absolute: 0 10*3/uL (ref 0.0–0.1)
Basophils Relative: 1 %
Eosinophils Absolute: 0 10*3/uL (ref 0.0–0.5)
Eosinophils Relative: 0 %
HCT: 36.8 % — ABNORMAL LOW (ref 39.0–52.0)
Hemoglobin: 12.1 g/dL — ABNORMAL LOW (ref 13.0–17.0)
Immature Granulocytes: 0 %
Lymphocytes Relative: 48 %
Lymphs Abs: 1.6 10*3/uL (ref 0.7–4.0)
MCH: 31.4 pg (ref 26.0–34.0)
MCHC: 32.9 g/dL (ref 30.0–36.0)
MCV: 95.6 fL (ref 80.0–100.0)
Monocytes Absolute: 0.4 10*3/uL (ref 0.1–1.0)
Monocytes Relative: 12 %
Neutro Abs: 1.3 10*3/uL — ABNORMAL LOW (ref 1.7–7.7)
Neutrophils Relative %: 39 %
Platelets: 196 10*3/uL (ref 150–400)
RBC: 3.85 MIL/uL — ABNORMAL LOW (ref 4.22–5.81)
RDW: 14.2 % (ref 11.5–15.5)
WBC: 3.3 10*3/uL — ABNORMAL LOW (ref 4.0–10.5)
nRBC: 0 % (ref 0.0–0.2)

## 2018-12-19 LAB — COMPREHENSIVE METABOLIC PANEL
ALT: 10 U/L (ref 0–44)
AST: 13 U/L — ABNORMAL LOW (ref 15–41)
Albumin: 3.7 g/dL (ref 3.5–5.0)
Alkaline Phosphatase: 38 U/L (ref 38–126)
Anion gap: 8 (ref 5–15)
BUN: 9 mg/dL (ref 8–23)
CO2: 27 mmol/L (ref 22–32)
Calcium: 9.6 mg/dL (ref 8.9–10.3)
Chloride: 101 mmol/L (ref 98–111)
Creatinine, Ser: 0.87 mg/dL (ref 0.61–1.24)
GFR calc Af Amer: >60 mL/min (ref >60)
GFR calc non Af Amer: >60 mL/min (ref >60)
Glucose, Bld: 107 mg/dL — ABNORMAL HIGH (ref 70–99)
Potassium: 3.7 mmol/L (ref 3.5–5.1)
Sodium: 136 mmol/L (ref 135–145)
Total Bilirubin: 0.4 mg/dL (ref 0.3–1.2)
Total Protein: 6.4 g/dL — ABNORMAL LOW (ref 6.5–8.1)

## 2018-12-19 LAB — PSA: Prostatic Specific Antigen: 206 ng/mL — ABNORMAL HIGH (ref 0.00–4.00)

## 2018-12-20 ENCOUNTER — Other Ambulatory Visit: Payer: Self-pay

## 2018-12-20 ENCOUNTER — Inpatient Hospital Stay: Payer: 59

## 2018-12-20 ENCOUNTER — Inpatient Hospital Stay (HOSPITAL_BASED_OUTPATIENT_CLINIC_OR_DEPARTMENT_OTHER): Payer: 59 | Admitting: Hospice and Palliative Medicine

## 2018-12-20 ENCOUNTER — Inpatient Hospital Stay (HOSPITAL_BASED_OUTPATIENT_CLINIC_OR_DEPARTMENT_OTHER): Payer: 59 | Admitting: Internal Medicine

## 2018-12-20 DIAGNOSIS — G893 Neoplasm related pain (acute) (chronic): Secondary | ICD-10-CM

## 2018-12-20 DIAGNOSIS — Z7189 Other specified counseling: Secondary | ICD-10-CM

## 2018-12-20 DIAGNOSIS — Z515 Encounter for palliative care: Secondary | ICD-10-CM

## 2018-12-20 DIAGNOSIS — C61 Malignant neoplasm of prostate: Secondary | ICD-10-CM

## 2018-12-20 DIAGNOSIS — C7951 Secondary malignant neoplasm of bone: Secondary | ICD-10-CM | POA: Diagnosis not present

## 2018-12-20 DIAGNOSIS — F329 Major depressive disorder, single episode, unspecified: Secondary | ICD-10-CM | POA: Diagnosis not present

## 2018-12-20 DIAGNOSIS — R519 Headache, unspecified: Secondary | ICD-10-CM | POA: Diagnosis not present

## 2018-12-20 DIAGNOSIS — Z79899 Other long term (current) drug therapy: Secondary | ICD-10-CM | POA: Diagnosis not present

## 2018-12-20 DIAGNOSIS — R5383 Other fatigue: Secondary | ICD-10-CM | POA: Diagnosis not present

## 2018-12-20 DIAGNOSIS — Z5111 Encounter for antineoplastic chemotherapy: Secondary | ICD-10-CM | POA: Diagnosis not present

## 2018-12-20 MED ORDER — SODIUM CHLORIDE 0.9 % IV SOLN
36.0000 mg/m2 | Freq: Once | INTRAVENOUS | Status: AC
  Administered 2018-12-20: 70 mg via INTRAVENOUS
  Filled 2018-12-20: qty 7

## 2018-12-20 MED ORDER — SODIUM CHLORIDE 0.9 % IV SOLN
Freq: Once | INTRAVENOUS | Status: AC
  Filled 2018-12-20: qty 250

## 2018-12-20 MED ORDER — DEXAMETHASONE SODIUM PHOSPHATE 10 MG/ML IJ SOLN
10.0000 mg | Freq: Once | INTRAMUSCULAR | Status: AC
  Administered 2018-12-20: 10 mg via INTRAVENOUS
  Filled 2018-12-20: qty 1

## 2018-12-20 MED ORDER — PROMETHAZINE HCL 25 MG PO TABS: ORAL_TABLET | ORAL | 3 refills | Status: DC

## 2018-12-20 NOTE — Progress Notes (Signed)
[facility] OFFICE PROGRESS NOTE  Patient Care Team: Olin Hauser, [name], [name], [name]  Cancer Staging No matching staging information was found for the patient.   Oncology History Overview Note  # 2011- PROSTATE CANCER [Gleason 3+4]; s/p Prostatectomy [ also involved bladder neck/ECP; Dr.Polaseck]; July 2014- Biochemical recurrence [PSA 14]- started on Zoladex [Dr.Pandit]; lost to follow up.  # JAN 2017- STAGE IV METASTATIC PROSTATE Cancer to Bone- Feb 13th, 2017-  Lupron q 62m[~end of feb]; PSA: 1021; Declined Chemo; April 2017 [xofigo x6; Dr.Crystal]; AUG 2017- Zytiga + Prednisone BID. Bone scan-Jan 2018- improved skeletal metastases.   # MAY 1st 2019- START X-tandi [stopped zytiga/PSA- 8.8/rising]  #August 23, 2018-stop Xtandi [intolerance-fatigue mental fogginess]  #Mid September 2020-apalutamide; stopped mid-October poor tolerance/progression  # 11/01/2018- -Taxotere weekly [consent]  # Mets to bone- start X-geva q 68M [May 30th]; hold dental issues/infections  # Smoker/ chronic pain/pain clinic   #[April 2018; [place]]- s/p stenting; on Plavix; --------------------------------------------------------------  DIAGNOSIS: '[ ]'  Castrate resistant prostate cancer  STAGE:   4    ;GOALS: Palliative  CURRENT/MOST RECENT THERAPY-Late Oct 2020- Taxotere weekly    [place] cancer [place] to bone ([facility]  12/08/2014 Initial Diagnosis   Prostate cancer metastatic to bone ([facility]   11/01/2018 -  Chemotherapy   The patient had DOCEtaxel (TAXOTERE) 70 mg in sodium chloride 0.9 % 150 mL chemo infusion, 36 mg/m2 = 70 mg, Intravenous,  Once, 6 of 10 cycles Administration: 70 mg (11/08/2018), 70 mg (11/22/2018), 70 mg (12/13/2018), 70 mg (12/06/2018), 70 mg (12/20/2018)  for chemotherapy treatment.     INTERVAL  HISTORY:  Robert GREENUP89y.o.  male pleasant patient above history of metastatic castrate resistant prostate cancer currently -on Taxotere weekly is here for follow-up.  Patient has had 5 weekly treatment so far.   Patient complains of ongoing fatigue.  Otherwise denies any tingling or numbness or leg swelling.  He continues to take narcotic pain medication; currently patches and oxycodone as needed.  No diarrhea.  No new shortness of breath or cough.  Review of Systems  Constitutional: Positive for malaise/fatigue and weight loss. Negative for chills, diaphoresis and fever.  HENT: Negative for nosebleeds and sore throat.   Eyes: Negative for double vision.  Respiratory: Negative for cough, hemoptysis, sputum production and wheezing.   Cardiovascular: Negative for chest pain, palpitations, orthopnea and leg swelling.  Gastrointestinal: Negative for abdominal pain, blood in stool, diarrhea, heartburn, melena, nausea and vomiting.  Genitourinary: Negative for dysuria, frequency and urgency.  Musculoskeletal: Positive for back pain and joint pain.  Skin: Negative.  Negative for itching and rash.  Neurological: Negative for dizziness, tingling, focal weakness, weakness and headaches.  Endo/Heme/Allergies: Does not bruise/bleed easily.  Psychiatric/Behavioral: Negative for depression. The patient is not nervous/anxious and does not have insomnia.       PAST MEDICAL HISTORY :  Past Medical History:  Diagnosis Date  . Back pain 10/09/2012  . Bone cancer [place]   . CAD (coronary artery disease)    a. 08/2015 PCI: LM nl, LAD 20d, LCX nl, RCA 285mRPAV 95 (3.0x18 Xience Alpine DES);  b. [facility] (2.75x8 Promus Premier DES).  . Cancer associated pain   . Depression   . History of echocardiogram    a. 08/2016 Echo: EF 50-55%.  . Marland Kitchenistory of kidney stones   .  Hyperlipidemia   . Hypertension   . Joint pain   . Prostate cancer [facility])    a.  s/p prostatectomy ([facility]);  b.  bone mets noted 04/2016.  . Right arm pain 01/10/2016  . Right foot pain 01/10/2016  . Right leg pain 01/10/2016  . Tobacco abuse     PAST SURGICAL HISTORY :   Past Surgical History:  Procedure Laterality Date  . CARDIAC CATHETERIZATION     [facility]  . CARDIAC CATHETERIZATION N/A 08/16/2015   Procedure: Left Heart Cath and Coronary Angiography;  Surgeon: Yolonda Kida, MD;  Location: [place] CV LAB;  Service: Cardiovascular;  Laterality: N/A;  . CARDIAC CATHETERIZATION N/A 08/16/2015   Procedure: Coronary Stent Intervention;  Surgeon: Yolonda Kida, MD;  Location: [place] CV LAB;  Service: Cardiovascular;  Laterality: N/A;  . CERVIX SURGERY    . PROSTATECTOMY    . SPINE SURGERY    . TONSILLECTOMY    . WRIST SURGERY      FAMILY HISTORY :   Family History  Problem Relation Age of Onset  . Heart attack Mother   . Hypertension Mother   . Heart attack Father     SOCIAL HISTORY:   Social History   Tobacco Use  . Smoking status: Current Every Day Smoker    Packs/day: 0.50    Years: 40.00    Pack years: 20.00    Types: Cigarettes  . Smokeless tobacco: Current User    Types: Chew  . Tobacco comment: 1 pack every couple days   Substance Use Topics  . Alcohol use: Yes    Comment: occasionally  . Drug use: No    ALLERGIES:  is allergic to ditropan [oxybutynin].  MEDICATIONS:  Current Outpatient Medications  Medication Sig Dispense Refill  . acetaminophen (TYLENOL) 500 MG tablet Take 1,000 mg by mouth every 8 (eight) hours as needed.     Marland Kitchen albuterol (PROVENTIL HFA;VENTOLIN HFA) 108 (90 Base) MCG/ACT inhaler Inhale 1-2 puffs into the lungs every 6 (six) hours as needed for wheezing or shortness of breath. 1 Inhaler 0  . aspirin 81 MG tablet Take 81 mg by mouth daily.    Marland Kitchen atorvastatin (LIPITOR) 80 MG tablet TAKE 1 TABLET BY MOUTH DAILY AT 6 PM. 90 tablet 3  . chlorhexidine (PERIDEX) 0.12 % solution     . clopidogrel (PLAVIX) 75 MG tablet TAKE 1 TABLET BY  MOUTH DAILY 90 tablet 0  . dexamethasone (DECADRON) 4 MG tablet Take one pill AM & PM x 3 days; start the day prior to chemo. 60 tablet 1  . fentaNYL (DURAGESIC) 100 MCG/HR Place 1 patch onto the skin every other day. Along with 50mg patch for total of 125 mcg every 2 days. 15 patch 0  . fentaNYL (DURAGESIC) 25 MCG/HR Place 1 patch onto the skin every other day. Use this along with fentanyl patch 100 mcg [total 125 mcg] every 2 days 15 patch 0  . gabapentin (NEURONTIN) 100 MG capsule START 1 CAPSULE DAILY, INCREASE BY 1 CAP EVERY 2 TO 3 DAYS AS TOLERATED UP TO 3 TIMES A DAY, OR MAY TAKE 3 AT ONCE IN [place] 90 capsule 5  . magnesium hydroxide (MILK OF MAGNESIA) 400 MG/5ML suspension Take 5 mLs by mouth daily as needed for constipation.    . metoprolol tartrate (LOPRESSOR) 25 MG tablet TAKE 1/2 TABLET BY MOUTH 2 TIMES DAILY. 90 tablet 1  . naloxone (NARCAN) nasal spray 4 mg/0.1 mL 1 spray into nostril upon  signs of opioid overdose. Call 911. May repeat once if no response within 2-3 minutes. 1 kit 0  . nitroGLYCERIN (NITROSTAT) 0.4 MG SL tablet Place 0.4 mg under the tongue every 5 (five) minutes as needed.     . ondansetron (ZOFRAN) 8 MG tablet One pill every 8 hours as needed for nausea/vomitting. 40 tablet 1  . oxyCODONE (ROXICODONE) 15 MG immediate release tablet Take 1 tablet (15 mg total) by mouth every 6 (six) hours as needed for pain. 90 tablet 0  . polyethylene glycol (MIRALAX / GLYCOLAX) packet Take 17 g by mouth daily as needed for moderate constipation.    . predniSONE (DELTASONE) 5 MG tablet TAKE 1 TABLET BY MOUTH DAILY WITH BREAKFAST. 30 tablet 1  . traZODone (DESYREL) 50 MG tablet Take 1-2 tablets (50-100 mg total) by mouth at bedtime as needed for sleep. 60 tablet 2  . Wheat Dextrin (BENEFIBER) POWD Stir 2 tsp. TID into 4-8 oz of any non-carbonated beverage or soft food (hot or cold) 500 g PRN  . promethazine (PHENERGAN) 25 MG tablet TAKE 1/2 TO 1 TABLET (12.5-25 MG TOTAL) BY MOUTH  EVERY 8 (EIGHT) HOURS AS NEEDED FOR NAUSEA OR VOMITING. 60 tablet 3   No current facility-administered medications for this visit.    PHYSICAL EXAMINATION: ECOG PERFORMANCE STATUS: 1 - Symptomatic but completely ambulatory  BP 123/84 (BP Location: Left Arm, Patient Position: Sitting)   Pulse 92   Temp 97.8 F (36.6 C) (Tympanic)   Resp 18   Wt 158 lb 12.8 oz (72 kg)   BMI 22.15 kg/m   Filed Weights   12/20/18 0800  Weight: 158 lb 12.8 oz (72 kg)    Physical Exam  Constitutional: He is oriented to person, place, and time.   Frail-appearing Caucasian male patient.  He is in a wheelchair.  Accompanied by his wife.  HENT:  Head: Normocephalic and atraumatic.  Mouth/Throat: Oropharynx is clear and moist. No oropharyngeal exudate.  Poor dentition.  Eyes: Pupils are equal, round, and reactive to light.  Cardiovascular: Normal rate and regular rhythm.  Pulmonary/Chest: No respiratory distress. He has no wheezes.  Decreased breath sounds bilaterally at the bases.  No wheeze or crackles  Abdominal: Soft. Bowel sounds are normal. He exhibits no distension and no mass. There is no abdominal tenderness. There is no rebound and no guarding.  Musculoskeletal:        General: No tenderness or edema. Normal range of motion.     Cervical back: Normal range of motion and neck supple.  Neurological: He is alert and oriented to person, place, and time.  Skin: Skin is warm.  Psychiatric: Affect normal.    LABORATORY DATA:  I have reviewed the data as listed    Component Value Date/Time   NA 136 12/19/2018 1011   NA 140 07/13/2013 1542   K 3.7 12/19/2018 1011   K 4.3 07/13/2013 1542   CL 101 12/19/2018 1011   CL 107 07/13/2013 1542   CO2 27 12/19/2018 1011   CO2 26 07/13/2013 1542   GLUCOSE 107 (H) 12/19/2018 1011   GLUCOSE 106 (H) 07/13/2013 1542   BUN 9 12/19/2018 1011   BUN 11 07/13/2013 1542   CREATININE 0.87 12/19/2018 1011   CREATININE 0.76 10/15/2017 1001   CALCIUM 9.6  12/19/2018 1011   CALCIUM 9.9 07/13/2013 1542   PROT 6.4 (L) 12/19/2018 1011   PROT 8.1 07/13/2013 1542   ALBUMIN 3.7 12/19/2018 1011   ALBUMIN 4.3 07/13/2013 1542  AST 13 (L) 12/19/2018 1011   AST 26 07/13/2013 1542   ALT 10 12/19/2018 1011   ALT 32 07/13/2013 1542   ALKPHOS 38 12/19/2018 1011   ALKPHOS 74 07/13/2013 1542   BILITOT 0.4 12/19/2018 1011   BILITOT 0.4 07/13/2013 1542   GFRNONAA >60 12/19/2018 1011   GFRNONAA 95 10/15/2017 1001   GFRAA >60 12/19/2018 1011   GFRAA 110 10/15/2017 1001    No results found for: SPEP, UPEP  Lab Results  Component Value Date   WBC 3.3 (L) 12/19/2018   NEUTROABS 1.3 (L) 12/19/2018   HGB 12.1 (L) 12/19/2018   HCT 36.8 (L) 12/19/2018   MCV 95.6 12/19/2018   PLT 196 12/19/2018      Chemistry      Component Value Date/Time   NA 136 12/19/2018 1011   NA 140 07/13/2013 1542   K 3.7 12/19/2018 1011   K 4.3 07/13/2013 1542   CL 101 12/19/2018 1011   CL 107 07/13/2013 1542   CO2 27 12/19/2018 1011   CO2 26 07/13/2013 1542   BUN 9 12/19/2018 1011   BUN 11 07/13/2013 1542   CREATININE 0.87 12/19/2018 1011   CREATININE 0.76 10/15/2017 1001      Component Value Date/Time   CALCIUM 9.6 12/19/2018 1011   CALCIUM 9.9 07/13/2013 1542   ALKPHOS 38 12/19/2018 1011   ALKPHOS 74 07/13/2013 1542   AST 13 (L) 12/19/2018 1011   AST 26 07/13/2013 1542   ALT 10 12/19/2018 1011   ALT 32 07/13/2013 1542   BILITOT 0.4 12/19/2018 1011   BILITOT 0.4 07/13/2013 1542       RADIOGRAPHIC STUDIES: I have personally reviewed the radiological images as listed and agreed with the findings in the report. No results found.   ASSESSMENT & PLAN:  Prostate cancer metastatic to bone ([place]) # Castrate resistant prostate cancer metastases to bone. Lupron every 3 months [10/04/2018]. Sep 2020; PSA- 190; rising. PET aux- 10/15- Progressive bone/ Left suprahilar-mediastinal LN. Currently, on Taxotere weekly. Stable. PSA- 204/ slightly improvement.   #  Proceed with Taxotere; Labs today reviewed;  acceptable for treatment today. Discussed re: 18 treatments.   #Epigastric discomfort-? steroids /gastritis- Prilosec 20 mg BID prior to meals. Improved.   # Depression/ anxiety- stable; stopped sec "mental foginess".   # Malignancy related pain- continue fentanyl patch hydrocodone;. Continue fentanyl patch to every 48 hours and continue hydrocodone. Stable.   # fatigue- multifactorial-chemotherapy/ malignancy polypharmacy.  Monitor for now.  Stable.    # DISPOSITION:  # Chemo today # Follow-up visit on 12/29 MD; cbc/cmp'PSA-labs in [place] 1 day prior/ Taxotere weekly-Dr. B      Orders Placed This Encounter  Procedures  . CBC with Differential    Standing Status:   Standing    Number of Occurrences:   20    Standing Expiration Date:   12/20/2019  . Comprehensive metabolic panel    Standing Status:   Standing    Number of Occurrences:   20    Standing Expiration Date:   12/20/2019  . PSA    Standing Status:   Standing    Number of Occurrences:   20    Standing Expiration Date:   12/20/2019   All questions were answered. The patient knows to call the clinic with any problems, questions or concerns.      [name], MD 12/20/2018 12:56 PM

## 2018-12-20 NOTE — Progress Notes (Signed)
ANC 1.3 today. Per Dr Rogue Bussing ok to proceed with treatment

## 2018-12-20 NOTE — Assessment & Plan Note (Addendum)
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 Lupron every 3 months [10/04/2018]. Sep 2020; PSA- 190; rising. PET aux- 10/15- Progressive bone/ Left suprahilar-mediastinal LN. Currently, on Taxotere weekly. Stable. PSA- 204/ slightly improvement.   # Proceed with Taxotere; Labs today reviewed;  acceptable for treatment today. Discussed re: 18 treatments.   #Epigastric discomfort-? steroids /gastritis- Prilosec 20 mg BID prior to meals. Improved.   # Depression/ anxiety- stable; stopped sec "mental foginess".   # Malignancy related pain- continue fentanyl patch hydrocodone;. Continue fentanyl patch to every 48 hours and continue hydrocodone. Stable.   # fatigue- multifactorial-chemotherapy/ malignancy polypharmacy.  Monitor for now.  Stable.    # DISPOSITION:  # Chemo today # Follow-up visit on 12/29 MD; cbc/cmp'PSA-labs in [place] 1 day prior/ Taxotere weekly-Dr. Jacinto Reap

## 2018-12-20 NOTE — Progress Notes (Signed)
[place]  [phone] [phone]   Name: Robert Short Date: 12/20/2018 MRN: [id number]  DOB: 02-09-1950  Patient Care Team: Olin Hauser, [name], [name], [name]    REASON FOR CONSULTATION: Palliative Care consult requested for this 68 y.o. male with multiple medical problems including stage IV prostate cancer metastatic to bone status post Xtandi.  Patient has had multiple symptoms including fatigue and pain.  He was referred to palliative care to help with symptom management and to address goals.  SOCIAL HISTORY:     reports that he has been smoking cigarettes. He has a 20.00 pack-year smoking history. His smokeless tobacco use includes chew. He reports current alcohol use. He reports that he does not use drugs.   Patient is married and lives at home with his wife. He has five children. Patient used to work as a Pharmacist, hospital, Administrator, and Engineer, production.  ADVANCE DIRECTIVES:  Does not have  CODE STATUS:   PAST MEDICAL HISTORY: Past Medical History:  Diagnosis Date  . Back pain 10/09/2012  . Bone cancer ([place])   . CAD (coronary artery disease)    a. 08/2015 PCI: LM nl, LAD 20d, LCX nl, RCA 34m RPAV 95 (3.0x18 Xience Alpine DES);  b. 04/2016 PCI ([facility]: RPL 95 (2.75x8 Promus Premier DES).  . Cancer associated pain   . Depression   . History of echocardiogram    a. 08/2016 Echo: EF 50-55%.  .Marland KitchenHistory of kidney stones   . Hyperlipidemia   . Hypertension   . Joint pain   . Prostate cancer ([facility]    a.  s/p prostatectomy ([facility]);  b. bone mets noted 04/2016.  . Right arm pain 01/10/2016  . Right foot pain 01/10/2016  . Right leg pain 01/10/2016  . Tobacco abuse     PAST SURGICAL HISTORY:  Past Surgical History:  Procedure Laterality Date  .  CARDIAC CATHETERIZATION     [facility]  . CARDIAC CATHETERIZATION N/A 08/16/2015   Procedure: Left Heart Cath and Coronary Angiography;  Surgeon: DYolonda Kida MD;  [facility];  Service: Cardiovascular;  Laterality: N/A;  . CARDIAC CATHETERIZATION N/A 08/16/2015   Procedure: Coronary Stent Intervention;  Surgeon: DYolonda Kida MD;  [facility];  Service: Cardiovascular;  Laterality: N/A;  . CERVIX SURGERY    . PROSTATECTOMY    . SPINE SURGERY    . TONSILLECTOMY    . WRIST SURGERY      HEMATOLOGY/ONCOLOGY HISTORY:  Oncology History Overview Note  # 2011- PROSTATE CANCER [Gleason 3+4]; s/p Prostatectomy [ also involved bladder neck/ECP; Dr.Polaseck]; July 2014- Biochemical recurrence [PSA 14]- started on Zoladex [Dr.Pandit]; lost to follow up.  # JAN 2017- STAGE IV METASTATIC PROSTATE Cancer to Bone- Feb 13th, 2017-  Lupron q 330m~end of feb]; PSA: 1021; Declined Chemo; April 2017 [xofigo x6; Dr.Crystal]; AUG 2017- Zytiga + Prednisone BID. Bone scan-Jan 2018- improved skeletal metastases.   # MAY 1st 2019- START X-tandi [stopped zytiga/PSA- 8.8/rising]  #August 23, 2018-stop Xtandi [intolerance-fatigue mental fogginess]  #Mid September 2020-apalutamide; stopped mid-October poor tolerance/progression  # 11/01/2018- -Taxotere weekly [consent]  # Mets to bone- start X-geva q 71M [May 30th]; hold dental issues/infections  # Smoker/ chronic pain/pain clinic   #[April 2018; [place]]- s/p stenting; on Plavix; --------------------------------------------------------------  DIAGNOSIS: _0   Castrate resistant prostate cancer  STAGE:   4    ;GOALS: Palliative  CURRENT/MOST RECENT THERAPY-Late Oct 2020- Taxotere weekly    Prostate cancer metastatic to bone ([place])  12/08/2014 Initial Diagnosis   Prostate cancer metastatic to bone ([place])   11/01/2018 -  Chemotherapy   The patient had DOCEtaxel (TAXOTERE) 70 mg in sodium chloride 0.9 % 150 mL  chemo infusion, 36 mg/m2 = 70 mg, Intravenous,  Once, 6 of 10 cycles Administration: 70 mg (11/08/2018), 70 mg (11/22/2018), 70 mg (12/13/2018), 70 mg (12/06/2018)  for chemotherapy treatment.      ALLERGIES:  is allergic to ditropan [oxybutynin].  MEDICATIONS:  Current Outpatient Medications  Medication Sig Dispense Refill  . acetaminophen (TYLENOL) 500 MG tablet Take 1,000 mg by mouth every 8 (eight) hours as needed.     Marland Kitchen albuterol (PROVENTIL HFA;VENTOLIN HFA) 108 (90 Base) MCG/ACT inhaler Inhale 1-2 puffs into the lungs every 6 (six) hours as needed for wheezing or shortness of breath. 1 Inhaler 0  . aspirin 81 MG tablet Take 81 mg by mouth daily.    Marland Kitchen atorvastatin (LIPITOR) 80 MG tablet TAKE 1 TABLET BY MOUTH DAILY AT 6 PM. 90 tablet 3  . chlorhexidine (PERIDEX) 0.12 % solution     . citalopram (CELEXA) 20 MG tablet Take 1 tablet (20 mg total) by mouth daily. 30 tablet 2  . clopidogrel (PLAVIX) 75 MG tablet TAKE 1 TABLET BY MOUTH DAILY 90 tablet 0  . dexamethasone (DECADRON) 4 MG tablet Take one pill AM & PM x 3 days; start the day prior to chemo. 60 tablet 1  . fentaNYL (DURAGESIC) 100 MCG/HR Place 1 patch onto the skin every other day. Along with 6mg patch for total of 125 mcg every 2 days. 15 patch 0  . fentaNYL (DURAGESIC) 25 MCG/HR Place 1 patch onto the skin every other day. Use this along with fentanyl patch 100 mcg [total 125 mcg] every 2 days 15 patch 0  . gabapentin (NEURONTIN) 100 MG capsule START 1 CAPSULE DAILY, INCREASE BY 1 CAP EVERY 2 TO 3 DAYS AS TOLERATED UP TO 3 TIMES A DAY, OR MAY TAKE 3 AT ONCE IN [place] 90 capsule 5  . magnesium hydroxide (MILK OF MAGNESIA) 400 MG/5ML suspension Take 5 mLs by mouth daily as needed for constipation.    . metoprolol tartrate (LOPRESSOR) 25 MG tablet TAKE 1/2 TABLET BY MOUTH 2 TIMES DAILY. 90 tablet 1  . naloxone (NARCAN) nasal spray 4 mg/0.1 mL 1 spray into nostril upon signs of opioid overdose. Call 911. May repeat once if no  response within 2-3 minutes. 1 kit 0  . nitroGLYCERIN (NITROSTAT) 0.4 MG SL tablet Place 0.4 mg under the tongue every 5 (five) minutes as needed.     . ondansetron (ZOFRAN) 8 MG tablet One pill every 8 hours as needed for nausea/vomitting. 40 tablet 1  . oxyCODONE (ROXICODONE) 15 MG immediate release tablet Take 1 tablet (15 mg total) by mouth every 6 (six) hours as needed for pain. 90 tablet 0  . polyethylene glycol (MIRALAX / GLYCOLAX) packet Take 17 g by mouth daily as needed for moderate constipation.    . predniSONE (DELTASONE) 5 MG tablet TAKE 1 TABLET BY MOUTH DAILY WITH BREAKFAST. 30 tablet 1  . promethazine (PHENERGAN) 25 MG tablet TAKE 1/2 TO 1 TABLET (12.5-25 MG TOTAL) BY MOUTH EVERY 8 (EIGHT) HOURS AS NEEDED FOR NAUSEA OR VOMITING. 30 tablet 3  . traZODone (DESYREL) 50 MG tablet Take 1-2 tablets (  50-100 mg total) by mouth at bedtime as needed for sleep. 60 tablet 2  . Wheat Dextrin (BENEFIBER) POWD Stir 2 tsp. TID into 4-8 oz of any non-carbonated beverage or soft food (hot or cold) 500 g PRN   No current facility-administered medications for this visit.   Facility-Administered Medications Ordered in Other Visits  Medication Dose Route Frequency Provider Last Rate Last Admin  . DOCEtaxel (TAXOTERE) 70 mg in sodium chloride 0.9 % 150 mL chemo infusion  36 mg/m2 (Treatment Plan Recorded) Intravenous Once [name], MD 157 mL/hr at 12/20/18 1017 70 mg at 12/20/18 1017    VITAL SIGNS: There were no vitals taken for this visit. There were no vitals filed for this visit.  Estimated body mass index is 22.15 kg/m as calculated from the following:   Height as of 11/22/18: _0  (1.803 m).   Weight as of an earlier encounter on 12/20/18: 158 lb 12.8 oz (72 kg).  LABS: CBC:    Component Value Date/Time   WBC 3.3 (L) 12/19/2018 1011   HGB 12.1 (L) 12/19/2018 1011   HGB 14.4 11/03/2013 1619   HCT 36.8 (L) 12/19/2018 1011   HCT 44.2 11/03/2013 1619   PLT 196 12/19/2018  1011   PLT 149 (L) 11/03/2013 1619   MCV 95.6 12/19/2018 1011   MCV 96 11/03/2013 1619   NEUTROABS 1.3 (L) 12/19/2018 1011   NEUTROABS 10.7 (H) 11/03/2013 1619   LYMPHSABS 1.6 12/19/2018 1011   LYMPHSABS 2.0 11/03/2013 1619   MONOABS 0.4 12/19/2018 1011   MONOABS 1.3 (H) 11/03/2013 1619   EOSABS 0.0 12/19/2018 1011   EOSABS 0.1 11/03/2013 1619   BASOSABS 0.0 12/19/2018 1011   BASOSABS 0.1 11/03/2013 1619   Comprehensive Metabolic Panel:    Component Value Date/Time   NA 136 12/19/2018 1011   NA 140 07/13/2013 1542   K 3.7 12/19/2018 1011   K 4.3 07/13/2013 1542   CL 101 12/19/2018 1011   CL 107 07/13/2013 1542   CO2 27 12/19/2018 1011   CO2 26 07/13/2013 1542   BUN 9 12/19/2018 1011   BUN 11 07/13/2013 1542   CREATININE 0.87 12/19/2018 1011   CREATININE 0.76 10/15/2017 1001   GLUCOSE 107 (H) 12/19/2018 1011   GLUCOSE 106 (H) 07/13/2013 1542   CALCIUM 9.6 12/19/2018 1011   CALCIUM 9.9 07/13/2013 1542   AST 13 (L) 12/19/2018 1011   AST 26 07/13/2013 1542   ALT 10 12/19/2018 1011   ALT 32 07/13/2013 1542   ALKPHOS 38 12/19/2018 1011   ALKPHOS 74 07/13/2013 1542   BILITOT 0.4 12/19/2018 1011   BILITOT 0.4 07/13/2013 1542   PROT 6.4 (L) 12/19/2018 1011   PROT 8.1 07/13/2013 1542   ALBUMIN 3.7 12/19/2018 1011   ALBUMIN 4.3 07/13/2013 1542    RADIOGRAPHIC STUDIES: No results found.  PERFORMANCE STATUS (ECOG) : 2 - Symptomatic, <50% confined to bed  Review of Systems Unless otherwise noted, a complete review of systems is negative.  Physical Exam General: NAD, frail appearing, thin Pulmonary: Unlabored Extremities: no edema, no joint deformities Skin: no rashes Neurological: Weakness but otherwise nonfocal  IMPRESSION: Routine follow-up visit today.  Patient is accompanied by his wife.  Patient reports that he is overall doing about the same.  He denies any significant changes or concerns.  Reports stable pain.  Has intermittent nausea but no vomiting.   Discussed use of scheduled antiemetics.  Oral intake is reportedly intermittently poor.  Weight has had slow downtrend over  the past several months.  Will refer to RD.    Patient reports stable mood.  He did not tolerate initiation of citalopram and says he wants to hold off on additional antidepressants.  I reviewed with patient ACP documents and a MOST Form, which he took home.  Case and plan discussed with Dr. Rogue Bussing  PLAN: -Continue current scope of treatment -Continue fentanyl 125 mcg every 48 hours with oxycodone to 15 mg every 4 hours as needed for  breakthrough pain -Trazodone 50 to 100 mg nightly for sleep -Discontinue citalopram -Refill promethazine  -Referral to RD -ACP/MOST Form reviewed -Follow-up telephone visit in 1 month   Patient expressed understanding and was in agreement with this plan. He also understands that He can call the clinic at any time with any questions, concerns, or complaints.     Time Total: 15 minutes  Visit consisted of counseling and education dealing with the complex and emotionally intense issues of symptom management and palliative care in the setting of serious and potentially life-threatening illness.Greater than 50%  of this time was spent counseling and coordinating care related to the above assessment and plan.  Signed by: Altha Harm, PhD, NP-C [phone] (Work Cell)

## 2018-12-25 ENCOUNTER — Other Ambulatory Visit: Payer: Self-pay

## 2018-12-25 ENCOUNTER — Other Ambulatory Visit: Payer: Self-pay | Admitting: Internal Medicine

## 2018-12-30 ENCOUNTER — Inpatient Hospital Stay: Payer: 59

## 2018-12-30 ENCOUNTER — Other Ambulatory Visit: Payer: Self-pay

## 2018-12-30 DIAGNOSIS — R519 Headache, unspecified: Secondary | ICD-10-CM | POA: Diagnosis not present

## 2018-12-30 DIAGNOSIS — C61 Malignant neoplasm of prostate: Secondary | ICD-10-CM | POA: Diagnosis not present

## 2018-12-30 DIAGNOSIS — Z79899 Other long term (current) drug therapy: Secondary | ICD-10-CM | POA: Diagnosis not present

## 2018-12-30 DIAGNOSIS — C7951 Secondary malignant neoplasm of bone: Secondary | ICD-10-CM | POA: Diagnosis not present

## 2018-12-30 DIAGNOSIS — F329 Major depressive disorder, single episode, unspecified: Secondary | ICD-10-CM | POA: Diagnosis not present

## 2018-12-30 DIAGNOSIS — R5383 Other fatigue: Secondary | ICD-10-CM | POA: Diagnosis not present

## 2018-12-30 DIAGNOSIS — Z5111 Encounter for antineoplastic chemotherapy: Secondary | ICD-10-CM | POA: Diagnosis not present

## 2018-12-30 LAB — CBC WITH DIFFERENTIAL/PLATELET
Abs Immature Granulocytes: 0.04 10*3/uL (ref 0.00–0.07)
Basophils Absolute: 0 10*3/uL (ref 0.0–0.1)
Basophils Relative: 0 %
Eosinophils Absolute: 0 10*3/uL (ref 0.0–0.5)
Eosinophils Relative: 0 %
HCT: 33.9 % — ABNORMAL LOW (ref 39.0–52.0)
Hemoglobin: 11.2 g/dL — ABNORMAL LOW (ref 13.0–17.0)
Immature Granulocytes: 1 %
Lymphocytes Relative: 29 %
Lymphs Abs: 1.4 10*3/uL (ref 0.7–4.0)
MCH: 31 pg (ref 26.0–34.0)
MCHC: 33 g/dL (ref 30.0–36.0)
MCV: 93.9 fL (ref 80.0–100.0)
Monocytes Absolute: 0.9 10*3/uL (ref 0.1–1.0)
Monocytes Relative: 18 %
Neutro Abs: 2.6 10*3/uL (ref 1.7–7.7)
Neutrophils Relative %: 52 %
Platelets: 247 10*3/uL (ref 150–400)
RBC: 3.61 MIL/uL — ABNORMAL LOW (ref 4.22–5.81)
RDW: 14 % (ref 11.5–15.5)
WBC: 5 10*3/uL (ref 4.0–10.5)
nRBC: 0 % (ref 0.0–0.2)

## 2018-12-30 LAB — COMPREHENSIVE METABOLIC PANEL
ALT: 10 U/L (ref 0–44)
AST: 15 U/L (ref 15–41)
Albumin: 3.6 g/dL (ref 3.5–5.0)
Alkaline Phosphatase: 34 U/L — ABNORMAL LOW (ref 38–126)
Anion gap: 7 (ref 5–15)
BUN: 6 mg/dL — ABNORMAL LOW (ref 8–23)
CO2: 24 mmol/L (ref 22–32)
Calcium: 9.6 mg/dL (ref 8.9–10.3)
Chloride: 103 mmol/L (ref 98–111)
Creatinine, Ser: 0.99 mg/dL (ref 0.61–1.24)
GFR calc Af Amer: >60 mL/min (ref >60)
GFR calc non Af Amer: >60 mL/min (ref >60)
Glucose, Bld: 97 mg/dL (ref 70–99)
Potassium: 3.9 mmol/L (ref 3.5–5.1)
Sodium: 134 mmol/L — ABNORMAL LOW (ref 135–145)
Total Bilirubin: 0.3 mg/dL (ref 0.3–1.2)
Total Protein: 6.3 g/dL — ABNORMAL LOW (ref 6.5–8.1)

## 2018-12-30 LAB — PSA: Prostatic Specific Antigen: 186 ng/mL — ABNORMAL HIGH (ref 0.00–4.00)

## 2018-12-30 NOTE — Progress Notes (Signed)
Patient pre screened for office appointment, no questions or concerns today. Patient reminded of upcoming appointment time and date. 

## 2018-12-31 ENCOUNTER — Inpatient Hospital Stay (HOSPITAL_BASED_OUTPATIENT_CLINIC_OR_DEPARTMENT_OTHER): Payer: 59 | Admitting: Internal Medicine

## 2018-12-31 ENCOUNTER — Inpatient Hospital Stay: Payer: 59

## 2018-12-31 ENCOUNTER — Other Ambulatory Visit: Payer: Self-pay

## 2018-12-31 ENCOUNTER — Other Ambulatory Visit: Payer: Self-pay | Admitting: Internal Medicine

## 2018-12-31 VITALS — BP 137/89 | HR 94 | Resp 20

## 2018-12-31 DIAGNOSIS — C7951 Secondary malignant neoplasm of bone: Secondary | ICD-10-CM

## 2018-12-31 DIAGNOSIS — C61 Malignant neoplasm of prostate: Secondary | ICD-10-CM

## 2018-12-31 DIAGNOSIS — Z79899 Other long term (current) drug therapy: Secondary | ICD-10-CM | POA: Diagnosis not present

## 2018-12-31 DIAGNOSIS — Z5111 Encounter for antineoplastic chemotherapy: Secondary | ICD-10-CM | POA: Diagnosis not present

## 2018-12-31 DIAGNOSIS — R5383 Other fatigue: Secondary | ICD-10-CM | POA: Diagnosis not present

## 2018-12-31 DIAGNOSIS — Z7189 Other specified counseling: Secondary | ICD-10-CM

## 2018-12-31 DIAGNOSIS — R519 Headache, unspecified: Secondary | ICD-10-CM | POA: Diagnosis not present

## 2018-12-31 DIAGNOSIS — F329 Major depressive disorder, single episode, unspecified: Secondary | ICD-10-CM | POA: Diagnosis not present

## 2018-12-31 MED ORDER — SODIUM CHLORIDE 0.9 % IV SOLN
36.0000 mg/m2 | Freq: Once | INTRAVENOUS | Status: AC
  Administered 2018-12-31: 12:00:00 70 mg via INTRAVENOUS
  Filled 2018-12-31: qty 7

## 2018-12-31 MED ORDER — DEXAMETHASONE SODIUM PHOSPHATE 10 MG/ML IJ SOLN
10.0000 mg | Freq: Once | INTRAMUSCULAR | Status: AC
  Administered 2018-12-31: 10 mg via INTRAVENOUS
  Filled 2018-12-31: qty 1

## 2018-12-31 MED ORDER — SODIUM CHLORIDE 0.9 % IV SOLN
Freq: Once | INTRAVENOUS | Status: AC
  Filled 2018-12-31: qty 250

## 2018-12-31 NOTE — Assessment & Plan Note (Addendum)
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 Lupron every 3 months [10/04/2018]. Sep 2020; PSA- 190; rising. PET aux- 10/15- Progressive bone/ Left suprahilar-mediastinal LN. Currently, on Taxotere weekly. STABLE;  PSA- 186; improving.    # Proceed with Taxotere; Labs today reviewed;  acceptable for treatment today  #Epigastric discomfort-? steroids /gastritis- Prilosec 20 mg BID; improved.   # Depression/ anxiety- STABLE; stopped sec "mental foginess".   # Malignancy related pain- continue fentanyl patch hydrocodone;. Continue fentanyl patch to every 48 hours and continue hydrocodone. Stable.   # fatigue- multifactorial-chemotherapy/ malignancy polypharmacy.  Monitor for now.  STABLE.   # DISPOSITION:  # Chemo today # Follow-up visit on 12/29 MD; cbc/cmp'PSA-labs in [place] 1 day prior/ Taxotere weekly; [facility]-Dr. Jacinto Reap

## 2018-12-31 NOTE — Progress Notes (Signed)
[place] OFFICE PROGRESS NOTE  Patient Care Team: Olin Hauser, [name], [name], [name]  Cancer Staging No matching staging information was found for the patient.   Oncology History Overview Note  # 2011- PROSTATE CANCER [Gleason 3+4]; s/p Prostatectomy [ also involved bladder neck/ECP; Dr.Polaseck]; July 2014- Biochemical recurrence [PSA 14]- started on Zoladex [Dr.Pandit]; lost to follow up.  # JAN 2017- STAGE IV METASTATIC PROSTATE Cancer to Bone- Feb 13th, 2017-  Lupron q 70m[~end of feb]; PSA: 1021; Declined Chemo; April 2017 [xofigo x6; Dr.Crystal]; AUG 2017- Zytiga + Prednisone BID. Bone scan-Jan 2018- improved skeletal metastases.   # MAY 1st 2019- START X-tandi [stopped zytiga/PSA- 8.8/rising]  #August 23, 2018-stop Xtandi [intolerance-fatigue mental fogginess]  #Mid September 2020-apalutamide; stopped mid-October poor tolerance/progression  # 11/01/2018- -Taxotere weekly [consent]  # Mets to bone- start X-geva q 62M [May 30th]; hold dental issues/infections  # Smoker/ chronic pain/pain clinic   #[April 2018; [place]]- s/p stenting; on Plavix; --------------------------------------------------------------  DIAGNOSIS: '[ ]'  Castrate resistant prostate cancer  STAGE:   4    ;GOALS: Palliative  CURRENT/MOST RECENT THERAPY-Late Oct 2020- Taxotere weekly [consent]    [place]  12/08/2014 Initial Diagnosis   [place]   11/01/2018 -  Chemotherapy   The patient had DOCEtaxel (TAXOTERE) 70 mg in sodium chloride 0.9 % 150 mL chemo infusion, 36 mg/m2 = 70 mg, Intravenous,  Once, 7 of 18 cycles Administration: 70 mg (11/08/2018), 70 mg (11/22/2018), 70 mg (12/13/2018), 70 mg (12/06/2018), 70 mg (12/20/2018)  for chemotherapy treatment.      INTERVAL HISTORY:  Robert PARTCH89y.o.  male pleasant patient above history of metastatic castrate resistant prostate cancer currently -on Taxotere weekly is here for follow-up.  Patient has had 6 weekly treatment so far.   Patient continues to complain of fatigue.  This is not getting any worse.  Chronic pain in the back of the joint for which he is on the fentanyl patches and oxycodone as needed.  No diarrhea.  No worsening tingling and numbness or swelling in the legs.  Review of Systems  Constitutional: Positive for malaise/fatigue and weight loss. Negative for chills, diaphoresis and fever.  HENT: Negative for nosebleeds and sore throat.   Eyes: Negative for double vision.  Respiratory: Negative for cough, hemoptysis, sputum production and wheezing.   Cardiovascular: Negative for chest pain, palpitations, orthopnea and leg swelling.  Gastrointestinal: Negative for abdominal pain, blood in stool, diarrhea, heartburn, melena, nausea and vomiting.  Genitourinary: Negative for dysuria, frequency and urgency.  Musculoskeletal: Positive for back pain and joint pain.  Skin: Negative.  Negative for itching and rash.  Neurological: Negative for dizziness, tingling, focal weakness, weakness and headaches.  Endo/Heme/Allergies: Does not bruise/bleed easily.  Psychiatric/Behavioral: Negative for depression. The patient is not nervous/anxious and does not have insomnia.       PAST MEDICAL HISTORY :  Past Medical History:  Diagnosis Date  . Back pain 10/09/2012  . Bone cancer ([place]   . CAD (coronary artery disease)    a. 08/2015 PCI: LM nl, LAD 20d, LCX nl, RCA 240mRPAV 95 (3.0x18 Xience Alpine DES);  b. [facility] (2.75x8 Promus Premier DES).  . Cancer associated pain   . Depression   . History of echocardiogram    a. 08/2016  Echo: EF 50-55%.  Marland Kitchen History of kidney stones   . Hyperlipidemia   . Hypertension   . Joint pain   . Prostate cancer [facility])    a.  s/p  prostatectomy ([facility]);  b. bone mets noted 04/2016.  . Right arm pain 01/10/2016  . Right foot pain 01/10/2016  . Right leg pain 01/10/2016  . Tobacco abuse     PAST SURGICAL HISTORY :   Past Surgical History:  Procedure Laterality Date  . CARDIAC CATHETERIZATION     [facility]  . CARDIAC CATHETERIZATION N/A 08/16/2015   Procedure: Left Heart Cath and Coronary Angiography;  Surgeon: Yolonda Kida, MD;  Location: [place] CV LAB;  Service: Cardiovascular;  Laterality: N/A;  . CARDIAC CATHETERIZATION N/A 08/16/2015   Procedure: Coronary Stent Intervention;  Surgeon: Yolonda Kida, MD;  Location: [place] CV LAB;  Service: Cardiovascular;  Laterality: N/A;  . CERVIX SURGERY    . PROSTATECTOMY    . SPINE SURGERY    . TONSILLECTOMY    . WRIST SURGERY      FAMILY HISTORY :   Family History  Problem Relation Age of Onset  . Heart attack Mother   . Hypertension Mother   . Heart attack Father     SOCIAL HISTORY:   Social History   Tobacco Use  . Smoking status: Current Every Day Smoker    Packs/day: 0.50    Years: 40.00    Pack years: 20.00    Types: Cigarettes  . Smokeless tobacco: Current User    Types: Chew  . Tobacco comment: 1 pack every couple days   Substance Use Topics  . Alcohol use: Yes    Comment: occasionally  . Drug use: No    ALLERGIES:  is allergic to ditropan [oxybutynin].  MEDICATIONS:  Current Outpatient Medications  Medication Sig Dispense Refill  . acetaminophen (TYLENOL) 500 MG tablet Take 1,000 mg by mouth every 8 (eight) hours as needed.     Marland Kitchen albuterol (PROVENTIL HFA;VENTOLIN HFA) 108 (90 Base) MCG/ACT inhaler Inhale 1-2 puffs into the lungs every 6 (six) hours as needed for wheezing or shortness of breath. 1 Inhaler 0  . aspirin 81 MG tablet Take 81 mg by mouth daily.    Marland Kitchen atorvastatin (LIPITOR) 80 MG tablet TAKE 1 TABLET BY MOUTH DAILY AT 6 PM. 90 tablet 3  . chlorhexidine (PERIDEX) 0.12 % solution     . clopidogrel (PLAVIX) 75 MG  tablet TAKE 1 TABLET BY MOUTH DAILY 90 tablet 0  . dexamethasone (DECADRON) 4 MG tablet Take one pill AM & PM x 3 days; start the day prior to chemo. 60 tablet 1  . fentaNYL (DURAGESIC) 100 MCG/HR Place 1 patch onto the skin every other day. Along with 31mg patch for total of 125 mcg every 2 days. 15 patch 0  . fentaNYL (DURAGESIC) 25 MCG/HR Place 1 patch onto the skin every other day. Use this along with fentanyl patch 100 mcg [total 125 mcg] every 2 days 15 patch 0  . gabapentin (NEURONTIN) 100 MG capsule START 1 CAPSULE DAILY, INCREASE BY 1 CAP EVERY 2 TO 3 DAYS AS TOLERATED UP TO 3 TIMES A DAY, OR MAY TAKE 3 AT [place]CE [place] capsule 5  . magnesium hydroxide (MILK OF MAGNESIA) 400 MG/5ML suspension Take 5 mLs by mouth daily as needed for constipation.    . metoprolol tartrate (LOPRESSOR) 25 MG tablet TAKE 1/2 TABLET BY MOUTH 2 TIMES DAILY. 90 tablet 1  .  naloxone (NARCAN) nasal spray 4 mg/0.1 mL 1 spray into nostril upon signs of opioid overdose. Call 911. May repeat once if no response within 2-3 minutes. 1 kit 0  . nitroGLYCERIN (NITROSTAT) 0.4 MG SL tablet Place 0.4 mg under the tongue every 5 (five) minutes as needed.     . ondansetron (ZOFRAN) 8 MG tablet One pill every 8 hours as needed for nausea/vomitting. 40 tablet 1  . oxyCODONE (ROXICODONE) 15 MG immediate release tablet Take 1 tablet (15 mg total) by mouth every 6 (six) hours as needed for pain. 90 tablet 0  . polyethylene glycol (MIRALAX / GLYCOLAX) packet Take 17 g by mouth daily as needed for moderate constipation.    . predniSONE (DELTASONE) 5 MG tablet TAKE 1 TABLET BY MOUTH DAILY WITH BREAKFAST. 30 tablet 1  . promethazine (PHENERGAN) 25 MG tablet TAKE 1/2 TO 1 TABLET (12.5-25 MG TOTAL) BY MOUTH EVERY 8 (EIGHT) HOURS AS NEEDED FOR NAUSEA OR VOMITING. 60 tablet 3  . traZODone (DESYREL) 50 MG tablet Take 1-2 tablets (50-100 mg total) by mouth at bedtime as needed for sleep. 60 tablet 2  . Wheat Dextrin (BENEFIBER) POWD Stir 2  tsp. TID into 4-8 oz of any non-carbonated beverage or soft food (hot or cold) 500 g PRN   No current facility-administered medications for this visit.   Facility-Administered Medications Ordered in Other Visits  Medication Dose Route Frequency Provider Last Rate Last Admin  . DOCEtaxel (TAXOTERE) 70 mg in sodium chloride 0.9 % 150 mL chemo infusion  36 mg/m2 (Treatment Plan Recorded) Intravenous Once [name], MD        PHYSICAL EXAMINATION: ECOG PERFORMANCE STATUS: 1 - Symptomatic but completely ambulatory  BP 131/88 (BP Location: Left Arm, Patient Position: Sitting, Cuff Size: Normal)   Pulse (!) 119   Temp (!) 96.1 F (35.6 C) (Tympanic)   Wt 158 lb (71.7 kg)   BMI 22.04 kg/m   Filed Weights   12/31/18 1116  Weight: 158 lb (71.7 kg)    Physical Exam  Constitutional: He is oriented to person, place, and time.   Frail-appearing Caucasian male patient.  He is in a wheelchair.  Accompanied by his wife.  HENT:  Head: Normocephalic and atraumatic.  Mouth/Throat: Oropharynx is clear and moist. No oropharyngeal exudate.  Poor dentition.  Eyes: Pupils are equal, round, and reactive to light.  Cardiovascular: Normal rate and regular rhythm.  Pulmonary/Chest: No respiratory distress. He has no wheezes.  Decreased breath sounds bilaterally at the bases.  No wheeze or crackles  Abdominal: Soft. Bowel sounds are normal. He exhibits no distension and no mass. There is no abdominal tenderness. There is no rebound and no guarding.  Musculoskeletal:        General: No tenderness or edema. Normal range of motion.     Cervical back: Normal range of motion and neck supple.  Neurological: He is alert and oriented to person, place, and time.  Skin: Skin is warm.  Psychiatric: Affect normal.    LABORATORY DATA:  I have reviewed the data as listed    Component Value Date/Time   NA 134 (L) 12/30/2018 1323   NA 140 07/13/2013 1542   K 3.9 12/30/2018 1323   K 4.3 07/13/2013  1542   CL 103 12/30/2018 1323   CL 107 07/13/2013 1542   CO2 24 12/30/2018 1323   CO2 26 07/13/2013 1542   GLUCOSE 97 12/30/2018 1323   GLUCOSE 106 (H) 07/13/2013 1542   BUN 6 (  L) 12/30/2018 1323   BUN 11 07/13/2013 1542   CREATININE 0.99 12/30/2018 1323   CREATININE 0.76 10/15/2017 1001   CALCIUM 9.6 12/30/2018 1323   CALCIUM 9.9 07/13/2013 1542   PROT 6.3 (L) 12/30/2018 1323   PROT 8.1 07/13/2013 1542   ALBUMIN 3.6 12/30/2018 1323   ALBUMIN 4.3 07/13/2013 1542   AST 15 12/30/2018 1323   AST 26 07/13/2013 1542   ALT 10 12/30/2018 1323   ALT 32 07/13/2013 1542   ALKPHOS 34 (L) 12/30/2018 1323   ALKPHOS 74 07/13/2013 1542   BILITOT 0.3 12/30/2018 1323   BILITOT 0.4 07/13/2013 1542   GFRNONAA >60 12/30/2018 1323   GFRNONAA 95 10/15/2017 1001   GFRAA >60 12/30/2018 1323   GFRAA 110 10/15/2017 1001    No results found for: SPEP, UPEP  Lab Results  Component Value Date   WBC 5.0 12/30/2018   NEUTROABS 2.6 12/30/2018   HGB 11.2 (L) 12/30/2018   HCT 33.9 (L) 12/30/2018   MCV 93.9 12/30/2018   PLT 247 12/30/2018      Chemistry      Component Value Date/Time   NA 134 (L) 12/30/2018 1323   NA 140 07/13/2013 1542   K 3.9 12/30/2018 1323   K 4.3 07/13/2013 1542   CL 103 12/30/2018 1323   CL 107 07/13/2013 1542   CO2 24 12/30/2018 1323   CO2 26 07/13/2013 1542   BUN 6 (L) 12/30/2018 1323   BUN 11 07/13/2013 1542   CREATININE 0.99 12/30/2018 1323   CREATININE 0.76 10/15/2017 1001      Component Value Date/Time   CALCIUM 9.6 12/30/2018 1323   CALCIUM 9.9 07/13/2013 1542   ALKPHOS 34 (L) 12/30/2018 1323   ALKPHOS 74 07/13/2013 1542   AST 15 12/30/2018 1323   AST 26 07/13/2013 1542   ALT 10 12/30/2018 1323   ALT 32 07/13/2013 1542   BILITOT 0.3 12/30/2018 1323   BILITOT 0.4 07/13/2013 1542       RADIOGRAPHIC STUDIES: I have personally reviewed the radiological images as listed and agreed with the findings in the report. No results found.   ASSESSMENT &  PLAN:  Prostate cancer metastatic to bone ([place]) # Castrate resistant prostate cancer metastases to bone. Lupron every 3 months [10/04/2018]. Sep 2020; PSA- 190; rising. PET aux- 10/15- Progressive bone/ Left suprahilar-mediastinal LN. Currently, on Taxotere weekly. STABLE;  PSA- 186; improving.    # Proceed with Taxotere; Labs today reviewed;  acceptable for treatment today  #Epigastric discomfort-? steroids /gastritis- Prilosec 20 mg BID; improved.   # Depression/ anxiety- STABLE; stopped sec "mental foginess".   # Malignancy related pain- continue fentanyl patch hydrocodone;. Continue fentanyl patch to every 48 hours and continue hydrocodone. Stable.   # fatigue- multifactorial-chemotherapy/ malignancy polypharmacy.  Monitor for now.  STABLE.   # DISPOSITION:  # Chemo today # Follow-up visit on 12/29 MD; cbc/cmp'PSA-labs in [place] 1 day prior/ Taxotere weekly-Dr. B      Orders Placed This Encounter  Procedures  . CBC with Differential    Standing Status:   Future    Standing Expiration Date:   12/31/2019  . Comprehensive metabolic panel    Standing Status:   Future    Standing Expiration Date:   12/31/2019  . PSA    Standing Status:   Future    Standing Expiration Date:   12/31/2019   All questions were answered. The patient knows to call the clinic with any problems, questions or concerns.  [name], MD 12/31/2018 12:23 PM

## 2019-01-06 ENCOUNTER — Telehealth: Payer: Self-pay

## 2019-01-06 ENCOUNTER — Inpatient Hospital Stay: Payer: 59 | Attending: Hematology and Oncology

## 2019-01-06 ENCOUNTER — Other Ambulatory Visit: Payer: Self-pay

## 2019-01-06 DIAGNOSIS — Z9079 Acquired absence of other genital organ(s): Secondary | ICD-10-CM | POA: Insufficient documentation

## 2019-01-06 DIAGNOSIS — Z87442 Personal history of urinary calculi: Secondary | ICD-10-CM | POA: Insufficient documentation

## 2019-01-06 DIAGNOSIS — Z955 Presence of coronary angioplasty implant and graft: Secondary | ICD-10-CM | POA: Insufficient documentation

## 2019-01-06 DIAGNOSIS — Z79899 Other long term (current) drug therapy: Secondary | ICD-10-CM | POA: Diagnosis not present

## 2019-01-06 DIAGNOSIS — Z7982 Long term (current) use of aspirin: Secondary | ICD-10-CM | POA: Insufficient documentation

## 2019-01-06 DIAGNOSIS — I10 Essential (primary) hypertension: Secondary | ICD-10-CM | POA: Insufficient documentation

## 2019-01-06 DIAGNOSIS — F329 Major depressive disorder, single episode, unspecified: Secondary | ICD-10-CM | POA: Insufficient documentation

## 2019-01-06 DIAGNOSIS — F17210 Nicotine dependence, cigarettes, uncomplicated: Secondary | ICD-10-CM | POA: Insufficient documentation

## 2019-01-06 DIAGNOSIS — E785 Hyperlipidemia, unspecified: Secondary | ICD-10-CM | POA: Diagnosis not present

## 2019-01-06 DIAGNOSIS — Z5111 Encounter for antineoplastic chemotherapy: Secondary | ICD-10-CM | POA: Diagnosis not present

## 2019-01-06 DIAGNOSIS — I2510 Atherosclerotic heart disease of native coronary artery without angina pectoris: Secondary | ICD-10-CM | POA: Insufficient documentation

## 2019-01-06 DIAGNOSIS — C7951 Secondary malignant neoplasm of bone: Secondary | ICD-10-CM | POA: Diagnosis not present

## 2019-01-06 DIAGNOSIS — C61 Malignant neoplasm of prostate: Secondary | ICD-10-CM | POA: Insufficient documentation

## 2019-01-06 LAB — CBC WITH DIFFERENTIAL/PLATELET
Abs Immature Granulocytes: 0.04 10*3/uL (ref 0.00–0.07)
Basophils Absolute: 0 10*3/uL (ref 0.0–0.1)
Basophils Relative: 0 %
Eosinophils Absolute: 0 10*3/uL (ref 0.0–0.5)
Eosinophils Relative: 0 %
HCT: 33.4 % — ABNORMAL LOW (ref 39.0–52.0)
Hemoglobin: 11.3 g/dL — ABNORMAL LOW (ref 13.0–17.0)
Immature Granulocytes: 1 %
Lymphocytes Relative: 24 %
Lymphs Abs: 1.5 10*3/uL (ref 0.7–4.0)
MCH: 31.5 pg (ref 26.0–34.0)
MCHC: 33.8 g/dL (ref 30.0–36.0)
MCV: 93 fL (ref 80.0–100.0)
Monocytes Absolute: 0.5 10*3/uL (ref 0.1–1.0)
Monocytes Relative: 9 %
Neutro Abs: 4.2 10*3/uL (ref 1.7–7.7)
Neutrophils Relative %: 66 %
Platelets: 231 10*3/uL (ref 150–400)
RBC: 3.59 MIL/uL — ABNORMAL LOW (ref 4.22–5.81)
RDW: 14 % (ref 11.5–15.5)
WBC: 6.2 10*3/uL (ref 4.0–10.5)
nRBC: 0 % (ref 0.0–0.2)

## 2019-01-06 LAB — COMPREHENSIVE METABOLIC PANEL
ALT: 11 U/L (ref 0–44)
AST: 14 U/L — ABNORMAL LOW (ref 15–41)
Albumin: 3.6 g/dL (ref 3.5–5.0)
Alkaline Phosphatase: 36 U/L — ABNORMAL LOW (ref 38–126)
Anion gap: 8 (ref 5–15)
BUN: 10 mg/dL (ref 8–23)
CO2: 26 mmol/L (ref 22–32)
Calcium: 9.3 mg/dL (ref 8.9–10.3)
Chloride: 102 mmol/L (ref 98–111)
Creatinine, Ser: 0.87 mg/dL (ref 0.61–1.24)
GFR calc Af Amer: >60 mL/min (ref >60)
GFR calc non Af Amer: >60 mL/min (ref >60)
Glucose, Bld: 115 mg/dL — ABNORMAL HIGH (ref 70–99)
Potassium: 3.6 mmol/L (ref 3.5–5.1)
Sodium: 136 mmol/L (ref 135–145)
Total Bilirubin: 0.4 mg/dL (ref 0.3–1.2)
Total Protein: 6.1 g/dL — ABNORMAL LOW (ref 6.5–8.1)

## 2019-01-06 LAB — PSA: Prostatic Specific Antigen: 117.28 ng/mL — ABNORMAL HIGH (ref 0.00–4.00)

## 2019-01-06 NOTE — Telephone Encounter (Signed)
Nutrition  Referral received from [facility] with Palliative Care for weight loss.   RD has tried to call patient 3 different times today with no answer or option to leave voice mail.   Emara Lichter B. Zenia Resides, [place], [place] Registered Dietitian [phone] (pager)

## 2019-01-06 NOTE — Progress Notes (Signed)
Called patient no answer unable to leave message 

## 2019-01-07 ENCOUNTER — Other Ambulatory Visit: Payer: Self-pay

## 2019-01-07 ENCOUNTER — Encounter: Payer: Self-pay | Admitting: Internal Medicine

## 2019-01-07 ENCOUNTER — Other Ambulatory Visit: Payer: Self-pay | Admitting: Family Medicine

## 2019-01-07 ENCOUNTER — Inpatient Hospital Stay: Payer: 59

## 2019-01-07 ENCOUNTER — Inpatient Hospital Stay (HOSPITAL_BASED_OUTPATIENT_CLINIC_OR_DEPARTMENT_OTHER): Payer: 59 | Admitting: Internal Medicine

## 2019-01-07 DIAGNOSIS — G893 Neoplasm related pain (acute) (chronic): Secondary | ICD-10-CM | POA: Diagnosis not present

## 2019-01-07 DIAGNOSIS — I10 Essential (primary) hypertension: Secondary | ICD-10-CM

## 2019-01-07 DIAGNOSIS — Z7189 Other specified counseling: Secondary | ICD-10-CM

## 2019-01-07 DIAGNOSIS — C61 Malignant neoplasm of prostate: Secondary | ICD-10-CM

## 2019-01-07 DIAGNOSIS — E785 Hyperlipidemia, unspecified: Secondary | ICD-10-CM | POA: Diagnosis not present

## 2019-01-07 DIAGNOSIS — I2510 Atherosclerotic heart disease of native coronary artery without angina pectoris: Secondary | ICD-10-CM | POA: Diagnosis not present

## 2019-01-07 DIAGNOSIS — Z5111 Encounter for antineoplastic chemotherapy: Secondary | ICD-10-CM | POA: Diagnosis not present

## 2019-01-07 DIAGNOSIS — F329 Major depressive disorder, single episode, unspecified: Secondary | ICD-10-CM | POA: Diagnosis not present

## 2019-01-07 DIAGNOSIS — F17210 Nicotine dependence, cigarettes, uncomplicated: Secondary | ICD-10-CM | POA: Diagnosis not present

## 2019-01-07 DIAGNOSIS — Z79899 Other long term (current) drug therapy: Secondary | ICD-10-CM | POA: Diagnosis not present

## 2019-01-07 DIAGNOSIS — C7951 Secondary malignant neoplasm of bone: Secondary | ICD-10-CM

## 2019-01-07 MED ORDER — TRIAMCINOLONE ACETONIDE 0.5 % EX OINT: 1.0000 "application " | TOPICAL_OINTMENT | Freq: Two times a day (BID) | CUTANEOUS | 0 refills | Status: DC

## 2019-01-07 MED ORDER — SODIUM CHLORIDE 0.9 % IV SOLN
36.0000 mg/m2 | Freq: Once | INTRAVENOUS | Status: AC
  Administered 2019-01-07: 70 mg via INTRAVENOUS
  Filled 2019-01-07: qty 7

## 2019-01-07 MED ORDER — DEXAMETHASONE SODIUM PHOSPHATE 10 MG/ML IJ SOLN
10.0000 mg | Freq: Once | INTRAMUSCULAR | Status: AC
  Administered 2019-01-07: 15:00:00 10 mg via INTRAVENOUS
  Filled 2019-01-07: qty 1

## 2019-01-07 MED ORDER — LEUPROLIDE ACETATE (3 MONTH) 22.5 MG ~~LOC~~ KIT
22.5000 mg | PACK | Freq: Once | SUBCUTANEOUS | Status: AC
  Administered 2019-01-07: 15:00:00 22.5 mg via SUBCUTANEOUS
  Filled 2019-01-07: qty 22.5

## 2019-01-07 MED ORDER — SODIUM CHLORIDE 0.9 % IV SOLN
Freq: Once | INTRAVENOUS | Status: AC
  Filled 2019-01-07: qty 250

## 2019-01-07 MED ORDER — LEUPROLIDE ACETATE (3 MONTH) 22.5 MG IM KIT: 22.5000 mg | PACK | Freq: Once | INTRAMUSCULAR | Status: DC

## 2019-01-07 MED ORDER — FENTANYL 25 MCG/HR TD PT72: 1.0000 | MEDICATED_PATCH | TRANSDERMAL | 0 refills | Status: DC

## 2019-01-07 MED ORDER — HEPARIN SOD (PORK) LOCK FLUSH 100 UNIT/ML IV SOLN
500.0000 [IU] | Freq: Once | INTRAVENOUS | Status: DC | PRN
  Filled 2019-01-07: qty 5

## 2019-01-07 MED ORDER — FENTANYL 100 MCG/HR TD PT72: 1.0000 | MEDICATED_PATCH | TRANSDERMAL | 0 refills | Status: DC

## 2019-01-07 NOTE — Assessment & Plan Note (Addendum)
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 Lupron every 3 months [10/04/2018]. Sep 2020; PSA- 190; rising. PET aux- 10/15- Progressive bone/ Left suprahilar-mediastinal LN. Currently, on Taxotere weekly. STABLE;  PSA- 117;Improving   # Proceed with Taxotere; Labs today reviewed;  acceptable for treatment today.   # Skin rash- ? Sec to Taxotere; recommend kenalog ointment sent.   # Malignancy related pain- continue fentanyl patch hydrocodone;. Continue fentanyl patch to every 48 hours and continue hydrocodone. STABLE.   # fatigue- multifactorial-chemotherapy/ malignancy polypharmacy.  Monitor for now.  STABLE.   # DISPOSITION:  # Chemo today; [place]ard # 1 week; MD/labs- cbc/cmp 1 day prior in [place]; Taxoeter-Dr. Jacinto Reap

## 2019-01-07 NOTE — Progress Notes (Signed)
[place] OFFICE PROGRESS NOTE  Patient Care Team: Olin Hauser, [name], [name], [name]  Cancer Staging No matching staging information was found for the patient.   Oncology History Overview Note  # 2011- PROSTATE CANCER [Gleason 3+4]; s/p Prostatectomy [ also involved bladder neck/ECP; Dr.Polaseck]; July 2014- Biochemical recurrence [PSA 14]- started on Zoladex [Dr.Pandit]; lost to follow up.  # JAN 2017- STAGE IV METASTATIC PROSTATE Cancer to Bone- Feb 13th, 2017-  Lupron q 70m[~end of feb]; PSA: 1021; Declined Chemo; April 2017 [xofigo x6; Dr.Crystal]; AUG 2017- Zytiga + Prednisone BID. Bone scan-Jan 2018- improved skeletal metastases.   # MAY 1st 2019- START X-tandi [stopped zytiga/PSA- 8.8/rising]  #August 23, 2018-stop Xtandi [intolerance-fatigue mental fogginess]  #Mid September 2020-apalutamide; stopped mid-October poor tolerance/progression  # 11/01/2018- -Taxotere weekly [consent]  # Mets to bone- start X-geva q 53M [May 30th]; hold dental issues/infections  # Smoker/ chronic pain/pain clinic   #[April 2018; [place]]- s/p stenting; on Plavix; --------------------------------------------------------------  DIAGNOSIS: '[ ]'  Castrate resistant prostate cancer  STAGE:   4    ;GOALS: Palliative  CURRENT/MOST RECENT THERAPY-Late Oct 2020- Taxotere weekly [consent]    Prostate cancer metastatic to bone ([facility]  12/08/2014 Initial Diagnosis   Prostate cancer metastatic to bone ([facility]   11/01/2018 -  Chemotherapy   The patient had DOCEtaxel (TAXOTERE) 70 mg in sodium chloride 0.9 % 150 mL chemo infusion, 36 mg/m2 = 70 mg, Intravenous,  Once, 7 of 18 cycles Administration: 70 mg (11/08/2018), 70 mg (11/22/2018), 70 mg (12/13/2018), 70 mg (12/06/2018), 70 mg (12/31/2018), 70 mg (12/20/2018)  for  chemotherapy treatment.     INTERVAL HISTORY:  Robert NJIE89y.[id number].  male pleasant patient above history of metastatic castrate resistant prostate cancer currently -on Taxotere weekly is here for follow-up.  Patient has had 8 weekly treatment so far.   Patient complains of rash on his bilateral hands-appears dry skin and also hurts with movement.  No peeling.  Patient continues to complain of chronic moderate fatigue.  Not any worse.  No worsening tingling and numbness in extremities.  Intermittent mild nausea.  No vomiting.  Review of Systems  Constitutional: Positive for malaise/fatigue and weight loss. Negative for chills, diaphoresis and fever.  HENT: Negative for nosebleeds and sore throat.   Eyes: Negative for double vision.  Respiratory: Negative for cough, hemoptysis, sputum production and wheezing.   Cardiovascular: Negative for chest pain, palpitations, orthopnea and leg swelling.  Gastrointestinal: Positive for nausea. Negative for abdominal pain, blood in stool, diarrhea, heartburn, melena and vomiting.  Genitourinary: Negative for dysuria, frequency and urgency.  Musculoskeletal: Positive for back pain and joint pain.  Skin: Positive for rash. Negative for itching.  Neurological: Negative for dizziness, tingling, focal weakness, weakness and headaches.  Endo/Heme/Allergies: Does not bruise/bleed easily.  Psychiatric/Behavioral: Negative for depression. The patient is not nervous/anxious and does not have insomnia.       PAST MEDICAL HISTORY :  Past Medical History:  Diagnosis Date  . Back pain 10/09/2012  . Bone cancer ([facility]   . CAD (coronary artery disease)    a. 08/2015 PCI: LM nl, LAD 20d, LCX nl, RCA 230mRPAV 95 (3.0x18 Xience Alpine DES);  b[facility] (2.75x8 Promus Premier DES).  . Cancer associated pain   . Depression   . History of echocardiogram  a. 08/2016 Echo: EF 50-55%.  Marland Kitchen History of kidney stones   . Hyperlipidemia   .  Hypertension   . Joint pain   . Prostate cancer [facility])    a.  s/p prostatectomy ([facility]);  b. bone mets noted 04/2016.  . Right arm pain 01/10/2016  . Right foot pain 01/10/2016  . Right leg pain 01/10/2016  . Tobacco abuse     PAST SURGICAL HISTORY :   Past Surgical History:  Procedure Laterality Date  . CARDIAC CATHETERIZATION     [facility]  . CARDIAC CATHETERIZATION N/A 08/16/2015   Procedure: Left Heart Cath and Coronary Angiography;  Surgeon: Yolonda Kida, MD;  Location: [place] CV LAB;  Service: Cardiovascular;  Laterality: N/A;  . CARDIAC CATHETERIZATION N/A 08/16/2015   Procedure: Coronary Stent Intervention;  Surgeon: Yolonda Kida, MD;  Location: [place] CV LAB;  Service: Cardiovascular;  Laterality: N/A;  . CERVIX SURGERY    . PROSTATECTOMY    . SPINE SURGERY    . TONSILLECTOMY    . WRIST SURGERY      FAMILY HISTORY :   Family History  Problem Relation Age of Onset  . Heart attack Mother   . Hypertension Mother   . Heart attack Father     SOCIAL HISTORY:   Social History   Tobacco Use  . Smoking status: Current Every Day Smoker    Packs/day: 0.50    Years: 40.00    Pack years: 20.00    Types: Cigarettes  . Smokeless tobacco: Current User    Types: Chew  . Tobacco comment: 1 pack every couple days   Substance Use Topics  . Alcohol use: Yes    Comment: occasionally  . Drug use: No    ALLERGIES:  is allergic to ditropan [oxybutynin].  MEDICATIONS:  Current Outpatient Medications  Medication Sig Dispense Refill  . acetaminophen (TYLENOL) 500 MG tablet Take 1,000 mg by mouth every 8 (eight) hours as needed.     Marland Kitchen albuterol (PROVENTIL HFA;VENTOLIN HFA) 108 (90 Base) MCG/ACT inhaler Inhale 1-2 puffs into the lungs every 6 (six) hours as needed for wheezing or shortness of breath. 1 Inhaler 0  . aspirin 81 MG tablet Take 81 mg by mouth daily.    Marland Kitchen atorvastatin (LIPITOR) 80 MG tablet TAKE 1 TABLET BY MOUTH DAILY AT 6 PM. 90 tablet 3  .  chlorhexidine (PERIDEX) 0.12 % solution     . clopidogrel (PLAVIX) 75 MG tablet TAKE 1 TABLET BY MOUTH DAILY 90 tablet 0  . dexamethasone (DECADRON) 4 MG tablet Take one pill AM & PM x 3 days; start the day prior to chemo. 60 tablet 1  . fentaNYL (DURAGESIC) 100 MCG/HR Place 1 patch onto the skin every other day. Along with 36mg patch for total of 125 mcg every 2 days. 15 patch 0  . fentaNYL (DURAGESIC) 25 MCG/HR Place 1 patch onto the skin every other day. Use this along with fentanyl patch 100 mcg [total 125 mcg] every 2 days 15 patch 0  . gabapentin (NEURONTIN) 100 MG capsule START 1 CAPSULE DAILY, INCREASE BY 1 CAP EVERY 2 TO 3 DAYS AS TOLERATED UP TO 3 TIMES A DAY, OR MAY TAKE 3 AT ONCE IN [place] 90 capsule 5  . magnesium hydroxide (MILK OF MAGNESIA) 400 MG/5ML suspension Take 5 mLs by mouth daily as needed for constipation.    . metoprolol tartrate (LOPRESSOR) 25 MG tablet TAKE 1/2 TABLET BY MOUTH 2 TIMES DAILY. 90 tablet 1  .  naloxone (NARCAN) nasal spray 4 mg/0.1 mL 1 spray into nostril upon signs of opioid overdose. Call 911. May repeat once if no response within 2-3 minutes. 1 kit 0  . nitroGLYCERIN (NITROSTAT) 0.4 MG SL tablet Place 0.4 mg under the tongue every 5 (five) minutes as needed.     . ondansetron (ZOFRAN) 8 MG tablet One pill every 8 hours as needed for nausea/vomitting. 40 tablet 1  . oxyCODONE (ROXICODONE) 15 MG immediate release tablet TAKE 1 TABLET BY MOUTH EVERY 6 HOURS AS NEEDED FOR PAIN. 90 tablet 0  . polyethylene glycol (MIRALAX / GLYCOLAX) packet Take 17 g by mouth daily as needed for moderate constipation.    . predniSONE (DELTASONE) 5 MG tablet TAKE 1 TABLET BY MOUTH DAILY WITH BREAKFAST. (Patient taking differently: Take 2.5 mg by mouth daily with breakfast. ) 30 tablet 1  . promethazine (PHENERGAN) 25 MG tablet TAKE 1/2 TO 1 TABLET (12.5-25 MG TOTAL) BY MOUTH EVERY 8 (EIGHT) HOURS AS NEEDED FOR NAUSEA OR VOMITING. 60 tablet 3  . traZODone (DESYREL) 50 MG tablet  Take 1-2 tablets (50-100 mg total) by mouth at bedtime as needed for sleep. 60 tablet 2  . Wheat Dextrin (BENEFIBER) POWD Stir 2 tsp. TID into 4-8 oz of any non-carbonated beverage or soft food (hot or cold) 500 g PRN  . triamcinolone ointment (KENALOG) 0.5 % Apply 1 application topically 2 (two) times daily. 30 g 0   No current facility-administered medications for this visit.    PHYSICAL EXAMINATION: ECOG PERFORMANCE STATUS: 1 - Symptomatic but completely ambulatory  BP (!) 152/92 (BP Location: Right Arm, Patient Position: Sitting)   Pulse 88   Temp (!) 95.8 F (35.4 C) (Tympanic)   Resp 20   Ht '5\' 11"'  (1.803 m)   Wt 156 lb 12.8 oz (71.1 kg)   BMI 21.87 kg/m   Filed Weights   01/07/19 1316  Weight: 156 lb 12.8 oz (71.1 kg)    Physical Exam  Constitutional: He is oriented to person, place, and time.   Frail-appearing Caucasian male patient.  He is in a walking with a cane.   He is alone.   HENT:  Head: Normocephalic and atraumatic.  Mouth/Throat: Oropharynx is clear and moist. No oropharyngeal exudate.  Poor dentition.  Eyes: Pupils are equal, round, and reactive to light.  Cardiovascular: Normal rate and regular rhythm.  Pulmonary/Chest: No respiratory distress. He has no wheezes.  Decreased breath sounds bilaterally at the bases.  No wheeze or crackles  Abdominal: Soft. Bowel sounds are normal. He exhibits no distension and no mass. There is no abdominal tenderness. There is no rebound and no guarding.  Musculoskeletal:        General: No tenderness or edema. Normal range of motion.     Cervical back: Normal range of motion and neck supple.  Neurological: He is alert and oriented to person, place, and time.  Skin: Skin is warm.  Macular rash on hands  Psychiatric: Affect normal.    LABORATORY DATA:  I have reviewed the data as listed    Component Value Date/Time   NA 136 01/06/2019 1123   NA 140 07/13/2013 1542   K 3.6 01/06/2019 1123   K 4.3 07/13/2013 1542    CL 102 01/06/2019 1123   CL 107 07/13/2013 1542   CO2 26 01/06/2019 1123   CO2 26 07/13/2013 1542   GLUCOSE 115 (H) 01/06/2019 1123   GLUCOSE 106 (H) 07/13/2013 1542   BUN 10 01/06/2019 1123  BUN 11 07/13/2013 1542   CREATININE 0.87 01/06/2019 1123   CREATININE 0.76 10/15/2017 1001   CALCIUM 9.3 01/06/2019 1123   CALCIUM 9.9 07/13/2013 1542   PROT 6.1 (L) 01/06/2019 1123   PROT 8.1 07/13/2013 1542   ALBUMIN 3.6 01/06/2019 1123   ALBUMIN 4.3 07/13/2013 1542   AST 14 (L) 01/06/2019 1123   AST 26 07/13/2013 1542   ALT 11 01/06/2019 1123   ALT 32 07/13/2013 1542   ALKPHOS 36 (L) 01/06/2019 1123   ALKPHOS 74 07/13/2013 1542   BILITOT 0.4 01/06/2019 1123   BILITOT 0.4 07/13/2013 1542   GFRNONAA >60 01/06/2019 1123   GFRNONAA 95 10/15/2017 1001   GFRAA >60 01/06/2019 1123   GFRAA 110 10/15/2017 1001    No results found for: SPEP, UPEP  Lab Results  Component Value Date   WBC 6.2 01/06/2019   NEUTROABS 4.2 01/06/2019   HGB 11.3 (L) 01/06/2019   HCT 33.4 (L) 01/06/2019   MCV 93.0 01/06/2019   PLT 231 01/06/2019      Chemistry      Component Value Date/Time   NA 136 01/06/2019 1123   NA 140 07/13/2013 1542   K 3.6 01/06/2019 1123   K 4.3 07/13/2013 1542   CL 102 01/06/2019 1123   CL 107 07/13/2013 1542   CO2 26 01/06/2019 1123   CO2 26 07/13/2013 1542   BUN 10 01/06/2019 1123   BUN 11 07/13/2013 1542   CREATININE 0.87 01/06/2019 1123   CREATININE 0.76 10/15/2017 1001      Component Value Date/Time   CALCIUM 9.3 01/06/2019 1123   CALCIUM 9.9 07/13/2013 1542   ALKPHOS 36 (L) 01/06/2019 1123   ALKPHOS 74 07/13/2013 1542   AST 14 (L) 01/06/2019 1123   AST 26 07/13/2013 1542   ALT 11 01/06/2019 1123   ALT 32 07/13/2013 1542   BILITOT 0.4 01/06/2019 1123   BILITOT 0.4 07/13/2013 1542       RADIOGRAPHIC STUDIES: I have personally reviewed the radiological images as listed and agreed with the findings in the report. No results found.   ASSESSMENT & PLAN:   Prostate cancer metastatic to bone ([place]) # Castrate resistant prostate cancer metastases to bone. Lupron every 3 months [10/04/2018]. Sep 2020; PSA- 190; rising. PET aux- 10/15- Progressive bone/ Left suprahilar-mediastinal LN. Currently, on Taxotere weekly. STABLE;  PSA- 117;Improving   # Proceed with Taxotere; Labs today reviewed;  acceptable for treatment today.   # Skin rash- ? Sec to Taxotere; recommend kenalog ointment sent.   # Malignancy related pain- continue fentanyl patch hydrocodone;. Continue fentanyl patch to every 48 hours and continue hydrocodone. STABLE.   # fatigue- multifactorial-chemotherapy/ malignancy polypharmacy.  Monitor for now.  STABLE.   # DISPOSITION:  # Chemo today; Eligard # 1 week; MD/labs- cbc/cmp 1 day prior in [place]; Taxoeter-Dr. B      No orders of the defined types were placed in this encounter.  All questions were answered. The patient knows to call the clinic with any problems, questions or concerns.      [name], MD 01/07/2019 1:48 PM

## 2019-01-09 ENCOUNTER — Telehealth: Payer: Self-pay

## 2019-01-09 NOTE — Telephone Encounter (Signed)
Nutrition Assessment   Reason for Assessment:  Referral from [place], NP regarding weight loss   ASSESSMENT:  69 year old male with metastatic prostate cancer to bone.  Past medical history of CAD, HTN, depression, HLD.  Patient receiving taxotere.  Spoke with patient via phone for nutrition assessment.  Patient reports that for 3-4 days following chemo feels nauseated and can't eat much.  Reports that he is taking promethazine.  Also reports heart burn but has not been taking prilosec.  Reports that he tries to eat eggs and oatmeal sometimes for breakfast and "different things" for lunch and supper.  Has tried ensure/boost shakes and drinks on occasion but does not really like them.     Medications: decadron, zofran, miralax, benfiber, phenergan   Labs: reviewed   Anthropometrics:   Height: 71 inches Weight: 156 lb 12.8 oz on 1/5 Noted 170 lb in July 2020 BMI: 22  8% weight loss in the last 6 months, concerning   Estimated Energy Needs  Kcals: 2100-2485 Protein: 105-124g Fluid: > 2 L   NUTRITION DIAGNOSIS: Inadequate oral intake related to cancer related treatment side effects as evidenced by 8% weight loss in 6 months and poor appetite 3-4 days following chemo   INTERVENTION:  Discussed strategies to help with nausea.  Will mail handout. Encouraged taking nausea medications on regular basis since patient knows will be nauseated following treatment. Encouraged taking medication for heart burn to control symptom.   Offered follow-up appointment and patient declined.  Will contact RD in the future if needed.  Contact information mailed.    Next Visit: no follow-up  Lamont Glasscock B. Zenia Resides, [place], [facility] Registered Dietitian [phone] (pager)

## 2019-01-13 ENCOUNTER — Other Ambulatory Visit: Payer: Self-pay

## 2019-01-13 ENCOUNTER — Inpatient Hospital Stay: Payer: 59

## 2019-01-13 DIAGNOSIS — C61 Malignant neoplasm of prostate: Secondary | ICD-10-CM

## 2019-01-13 DIAGNOSIS — F17210 Nicotine dependence, cigarettes, uncomplicated: Secondary | ICD-10-CM | POA: Diagnosis not present

## 2019-01-13 DIAGNOSIS — E785 Hyperlipidemia, unspecified: Secondary | ICD-10-CM | POA: Diagnosis not present

## 2019-01-13 DIAGNOSIS — Z79899 Other long term (current) drug therapy: Secondary | ICD-10-CM | POA: Diagnosis not present

## 2019-01-13 DIAGNOSIS — F329 Major depressive disorder, single episode, unspecified: Secondary | ICD-10-CM | POA: Diagnosis not present

## 2019-01-13 DIAGNOSIS — I2510 Atherosclerotic heart disease of native coronary artery without angina pectoris: Secondary | ICD-10-CM | POA: Diagnosis not present

## 2019-01-13 DIAGNOSIS — C7951 Secondary malignant neoplasm of bone: Secondary | ICD-10-CM | POA: Diagnosis not present

## 2019-01-13 DIAGNOSIS — Z5111 Encounter for antineoplastic chemotherapy: Secondary | ICD-10-CM | POA: Diagnosis not present

## 2019-01-13 DIAGNOSIS — I10 Essential (primary) hypertension: Secondary | ICD-10-CM | POA: Diagnosis not present

## 2019-01-13 LAB — COMPREHENSIVE METABOLIC PANEL
ALT: 9 U/L (ref 0–44)
AST: 14 U/L — ABNORMAL LOW (ref 15–41)
Albumin: 3.7 g/dL (ref 3.5–5.0)
Alkaline Phosphatase: 36 U/L — ABNORMAL LOW (ref 38–126)
Anion gap: 8 (ref 5–15)
BUN: 11 mg/dL (ref 8–23)
CO2: 25 mmol/L (ref 22–32)
Calcium: 9.5 mg/dL (ref 8.9–10.3)
Chloride: 100 mmol/L (ref 98–111)
Creatinine, Ser: 0.8 mg/dL (ref 0.61–1.24)
GFR calc Af Amer: >60 mL/min (ref >60)
GFR calc non Af Amer: >60 mL/min (ref >60)
Glucose, Bld: 111 mg/dL — ABNORMAL HIGH (ref 70–99)
Potassium: 3.9 mmol/L (ref 3.5–5.1)
Sodium: 133 mmol/L — ABNORMAL LOW (ref 135–145)
Total Bilirubin: 0.8 mg/dL (ref 0.3–1.2)
Total Protein: 6.4 g/dL — ABNORMAL LOW (ref 6.5–8.1)

## 2019-01-13 LAB — CBC WITH DIFFERENTIAL/PLATELET
Abs Immature Granulocytes: 0.03 10*3/uL (ref 0.00–0.07)
Basophils Absolute: 0 10*3/uL (ref 0.0–0.1)
Basophils Relative: 0 %
Eosinophils Absolute: 0 10*3/uL (ref 0.0–0.5)
Eosinophils Relative: 0 %
HCT: 34.6 % — ABNORMAL LOW (ref 39.0–52.0)
Hemoglobin: 11.3 g/dL — ABNORMAL LOW (ref 13.0–17.0)
Immature Granulocytes: 1 %
Lymphocytes Relative: 29 %
Lymphs Abs: 1.7 10*3/uL (ref 0.7–4.0)
MCH: 30.8 pg (ref 26.0–34.0)
MCHC: 32.7 g/dL (ref 30.0–36.0)
MCV: 94.3 fL (ref 80.0–100.0)
Monocytes Absolute: 0.5 10*3/uL (ref 0.1–1.0)
Monocytes Relative: 8 %
Neutro Abs: 3.6 10*3/uL (ref 1.7–7.7)
Neutrophils Relative %: 62 %
Platelets: 242 10*3/uL (ref 150–400)
RBC: 3.67 MIL/uL — ABNORMAL LOW (ref 4.22–5.81)
RDW: 14.6 % (ref 11.5–15.5)
WBC: 5.9 10*3/uL (ref 4.0–10.5)
nRBC: 0 % (ref 0.0–0.2)

## 2019-01-13 LAB — PSA: Prostatic Specific Antigen: 113.91 ng/mL — ABNORMAL HIGH (ref 0.00–4.00)

## 2019-01-13 NOTE — Progress Notes (Signed)
Called patient no answer unable to leave message 

## 2019-01-14 ENCOUNTER — Telehealth: Payer: Self-pay | Admitting: Internal Medicine

## 2019-01-14 ENCOUNTER — Encounter: Payer: Self-pay | Admitting: Internal Medicine

## 2019-01-14 ENCOUNTER — Inpatient Hospital Stay: Payer: 59

## 2019-01-14 ENCOUNTER — Inpatient Hospital Stay (HOSPITAL_BASED_OUTPATIENT_CLINIC_OR_DEPARTMENT_OTHER): Payer: 59 | Admitting: Internal Medicine

## 2019-01-14 ENCOUNTER — Other Ambulatory Visit: Payer: Self-pay

## 2019-01-14 VITALS — BP 129/78 | HR 70

## 2019-01-14 DIAGNOSIS — C61 Malignant neoplasm of prostate: Secondary | ICD-10-CM

## 2019-01-14 DIAGNOSIS — Z5111 Encounter for antineoplastic chemotherapy: Secondary | ICD-10-CM | POA: Diagnosis not present

## 2019-01-14 DIAGNOSIS — I10 Essential (primary) hypertension: Secondary | ICD-10-CM | POA: Diagnosis not present

## 2019-01-14 DIAGNOSIS — E785 Hyperlipidemia, unspecified: Secondary | ICD-10-CM | POA: Diagnosis not present

## 2019-01-14 DIAGNOSIS — I2510 Atherosclerotic heart disease of native coronary artery without angina pectoris: Secondary | ICD-10-CM | POA: Diagnosis not present

## 2019-01-14 DIAGNOSIS — C7951 Secondary malignant neoplasm of bone: Secondary | ICD-10-CM

## 2019-01-14 DIAGNOSIS — F329 Major depressive disorder, single episode, unspecified: Secondary | ICD-10-CM | POA: Diagnosis not present

## 2019-01-14 DIAGNOSIS — F17210 Nicotine dependence, cigarettes, uncomplicated: Secondary | ICD-10-CM | POA: Diagnosis not present

## 2019-01-14 DIAGNOSIS — Z79899 Other long term (current) drug therapy: Secondary | ICD-10-CM | POA: Diagnosis not present

## 2019-01-14 MED ORDER — DEXAMETHASONE SODIUM PHOSPHATE 10 MG/ML IJ SOLN
10.0000 mg | Freq: Once | INTRAMUSCULAR | Status: AC
  Administered 2019-01-14: 10 mg via INTRAVENOUS
  Filled 2019-01-14: qty 1

## 2019-01-14 MED ORDER — SODIUM CHLORIDE 0.9 % IV SOLN
Freq: Once | INTRAVENOUS | Status: AC
  Filled 2019-01-14: qty 250

## 2019-01-14 NOTE — Progress Notes (Signed)
[facility] OFFICE PROGRESS NOTE  Patient Care Team: Olin Hauser, [name], [name], [name]  Cancer Staging No matching staging information was found for the patient.   Oncology History Overview Note  # 2011- PROSTATE CANCER [Gleason 3+4]; s/p Prostatectomy [ also involved bladder neck/ECP; Dr.Polaseck]; July 2014- Biochemical recurrence [PSA 14]- started on Zoladex [Dr.Pandit]; lost to follow up.  # JAN 2017- STAGE IV METASTATIC PROSTATE Cancer to Bone- Feb 13th, 2017-  Lupron q 37m[~end of feb]; PSA: 1021; Declined Chemo; April 2017 [xofigo x6; Dr.Crystal]; AUG 2017- Zytiga + Prednisone BID. Bone scan-Jan 2018- improved skeletal metastases.   # MAY 1st 2019- START X-tandi [stopped zytiga/PSA- 8.8/rising]  #August 23, 2018-stop Xtandi [intolerance-fatigue mental fogginess]  #Mid September 2020-apalutamide; stopped mid-October poor tolerance/progression  # 11/01/2018- -Taxotere weekly [consent]  # Mets to bone- start X-geva q 38M [May 30th]; hold dental issues/infections  # Smoker/ chronic pain/pain clinic   #[April 2018; [place]]- s/p stenting; on Plavix; --------------------------------------------------------------  DIAGNOSIS: '[ ]'  Castrate resistant prostate cancer  STAGE:   4    ;GOALS: Palliative  CURRENT/MOST RECENT THERAPY-Late Oct 2020- Taxotere weekly [consent]    [place]  12/08/2014 Initial Diagnosis   [place] cancer [place] to [place] ([place]   11/01/2018 -  Chemotherapy   The patient had DOCEtaxel (TAXOTERE) 70 mg in sodium chloride 0.9 % 150 mL chemo infusion, 36 mg/m2 = 70 mg, Intravenous,  Once, 8 of 18 cycles Administration: 70 mg (11/08/2018), 70 mg (11/22/2018), 70 mg (12/13/2018), 70 mg (12/06/2018), 70 mg (12/31/2018), 70 mg (01/07/2019), 70 mg  (12/20/2018)  for chemotherapy treatment.     INTERVAL HISTORY:  Robert BOURSIQUOT89y.o.  male pleasant patient above history of metastatic castrate resistant prostate cancer currently -on Taxotere weekly is here for follow-up.  Patient has had 9 weekly treatment so far.   Patient complains of worsening nausea vomiting multiple episodes yesterday.  Currently improved with Phenergan.  However he feels poorly.  Poor appetite.   He continues to complain of rash in his bilateral hands.  States Kenalog ointment made it worse.  Also complains of soreness in the mouth.  Also nosebleed this morning.  Review of Systems  Constitutional: Positive for malaise/fatigue and weight loss. Negative for chills, diaphoresis and fever.  HENT: Negative for nosebleeds and sore throat.   Eyes: Negative for double vision.  Respiratory: Negative for cough, hemoptysis, sputum production and wheezing.   Cardiovascular: Negative for chest pain, palpitations, orthopnea and leg swelling.  Gastrointestinal: Positive for nausea. Negative for abdominal pain, blood in stool, diarrhea, heartburn, melena and vomiting.  Genitourinary: Negative for dysuria, frequency and urgency.  Musculoskeletal: Positive for back pain and joint pain.  Skin: Positive for rash. Negative for itching.  Neurological: Negative for dizziness, tingling, focal weakness, weakness and headaches.  Endo/Heme/Allergies: Does not bruise/bleed easily.  Psychiatric/Behavioral: Negative for depression. The patient is not nervous/anxious and does not have insomnia.       PAST MEDICAL HISTORY :  Past Medical History:  Diagnosis Date  . Back pain 10/09/2012  . [place]   . CAD (coronary artery disease)    a. 08/2015 PCI: LM nl, LAD 20d, LCX nl, RCA 245mRPAV 95 (3.0x18 Xience Alpine DES);  b. [facility] (2.75x8 Promus Premier DES).  . Cancer associated pain   .  Depression   . History of echocardiogram    a. 08/2016 Echo: EF  50-55%.  Marland Kitchen History of kidney stones   . Hyperlipidemia   . Hypertension   . Joint pain   . Prostate cancer [facility])    a.  s/p prostatectomy ([facility]);  b. bone mets noted 04/2016.  . Right arm pain 01/10/2016  . Right foot pain 01/10/2016  . Right leg pain 01/10/2016  . Tobacco abuse     PAST SURGICAL HISTORY :   Past Surgical History:  Procedure Laterality Date  . CARDIAC CATHETERIZATION     [facility]  . CARDIAC CATHETERIZATION N/A 08/16/2015   Procedure: Left Heart Cath and Coronary Angiography;  Surgeon: Yolonda Kida, MD;  Location: [facility];  Service: Cardiovascular;  Laterality: N/A;  . CARDIAC CATHETERIZATION N/A 08/16/2015   Procedure: Coronary Stent Intervention;  Surgeon: Yolonda Kida, MD;  Location: [facility];  Service: Cardiovascular;  Laterality: N/A;  . CERVIX SURGERY    . PROSTATECTOMY    . SPINE SURGERY    . TONSILLECTOMY    . WRIST SURGERY      FAMILY HISTORY :   Family History  Problem Relation Age of Onset  . Heart attack Mother   . Hypertension Mother   . Heart attack Father     SOCIAL HISTORY:   Social History   Tobacco Use  . Smoking status: Current Every Day Smoker    Packs/day: 0.50    Years: 40.00    Pack years: 20.00    Types: Cigarettes  . Smokeless tobacco: Current User    Types: Chew  . Tobacco comment: 1 pack every couple days   Substance Use Topics  . Alcohol use: Yes    Comment: occasionally  . Drug use: No    ALLERGIES:  is allergic to ditropan [oxybutynin].  MEDICATIONS:  Current Outpatient Medications  Medication Sig Dispense Refill  . acetaminophen (TYLENOL) 500 MG tablet Take 1,000 mg by mouth every 8 (eight) hours as needed.     Marland Kitchen albuterol (PROVENTIL HFA;VENTOLIN HFA) 108 (90 Base) MCG/ACT inhaler Inhale 1-2 puffs into the lungs every 6 (six) hours as needed for wheezing or shortness of breath. 1 Inhaler 0  . aspirin 81 MG tablet Take 81 mg by mouth daily.    Marland Kitchen atorvastatin (LIPITOR) 80 MG tablet  TAKE 1 TABLET BY MOUTH DAILY AT 6 PM. 90 tablet 3  . chlorhexidine (PERIDEX) 0.12 % solution     . clopidogrel (PLAVIX) 75 MG tablet TAKE 1 TABLET BY MOUTH DAILY 90 tablet 0  . dexamethasone (DECADRON) 4 MG tablet Take one pill AM & PM x 3 days; start the day prior to chemo. 60 tablet 1  . fentaNYL (DURAGESIC) 100 MCG/HR Place 1 patch onto the skin every other day. Along with 28mg patch for total of 125 mcg every 2 days. 15 patch 0  . fentaNYL (DURAGESIC) 25 MCG/HR Place 1 patch onto the skin every other day. Use this along with fentanyl patch 100 mcg [total 125 mcg] every 2 days 15 patch 0  . gabapentin (NEURONTIN) 100 MG capsule START 1 CAPSULE DAILY, INCREASE BY 1 CAP EVERY 2 TO 3 DAYS AS TOLERATED UP TO 3 TIMES A DAY, OR MAY TAKE 3 AT ONCE IN [place] capsule 5  . metoprolol tartrate (LOPRESSOR) 25 MG tablet TAKE 1/2 TABLET BY MOUTH 2 TIMES DAILY. 90 tablet 1  . oxyCODONE (ROXICODONE) 15 MG immediate release tablet TAKE 1 TABLET  BY MOUTH EVERY 6 HOURS AS NEEDED FOR PAIN. 90 tablet 0  . promethazine (PHENERGAN) 25 MG tablet TAKE 1/2 TO 1 TABLET (12.5-25 MG TOTAL) BY MOUTH EVERY 8 (EIGHT) HOURS AS NEEDED FOR NAUSEA OR VOMITING. 60 tablet 3  . triamcinolone ointment (KENALOG) 0.5 % Apply 1 application topically 2 (two) times daily. 30 g 0  . Wheat Dextrin (BENEFIBER) POWD Stir 2 tsp. TID into 4-8 oz of any non-carbonated beverage or soft food (hot or cold) 500 g PRN  . magnesium hydroxide (MILK OF MAGNESIA) 400 MG/5ML suspension Take 5 mLs by mouth daily as needed for constipation.    . naloxone (NARCAN) nasal spray 4 mg/0.1 mL 1 spray into nostril upon signs of opioid overdose. Call 911. May repeat once if no response within 2-3 minutes. (Patient not taking: Reported on 01/14/2019) 1 kit 0  . nitroGLYCERIN (NITROSTAT) 0.4 MG SL tablet Place 0.4 mg under the tongue every 5 (five) minutes as needed.     . polyethylene glycol (MIRALAX / GLYCOLAX) packet Take 17 g by mouth daily as needed for  moderate constipation.    . predniSONE (DELTASONE) 5 MG tablet TAKE 1 TABLET BY MOUTH DAILY WITH BREAKFAST. (Patient taking differently: Take 2.5 mg by mouth daily with breakfast. ) 30 tablet 1  . traZODone (DESYREL) 50 MG tablet Take 1-2 tablets (50-100 mg total) by mouth at bedtime as needed for sleep. (Patient not taking: Reported on 01/14/2019) 60 tablet 2   No current facility-administered medications for this visit.    PHYSICAL EXAMINATION: ECOG PERFORMANCE STATUS: 1 - Symptomatic but completely ambulatory  BP 112/77 (Patient Position: Sitting)   Pulse 93   Temp (!) 97.3 F (36.3 C) (Tympanic)   Resp 20   Ht '5\' 11"'  (1.803 m)   Wt 157 lb (71.2 kg)   BMI 21.90 kg/m   Filed Weights   01/14/19 1111  Weight: 157 lb (71.2 kg)    Physical Exam  Constitutional: He is oriented to person, place, and time.   Frail-appearing Caucasian male patient.  He is in a walking with a cane.   He is accompanied by his wife.  HENT:  Head: Normocephalic and atraumatic.  Mouth/Throat: Oropharynx is clear and moist. No oropharyngeal exudate.  Poor dentition.  Eyes: Pupils are equal, round, and reactive to light.  Cardiovascular: Normal rate and regular rhythm.  Pulmonary/Chest: No respiratory distress. He has no wheezes.  Decreased breath sounds bilaterally at the bases.  No wheeze or crackles  Abdominal: Soft. Bowel sounds are normal. He exhibits no distension and no mass. There is no abdominal tenderness. There is no rebound and no guarding.  Musculoskeletal:        General: No tenderness or edema. Normal range of motion.     Cervical back: Normal range of motion and neck supple.  Neurological: He is alert and oriented to person, place, and time.  Skin: Skin is warm.  Macular rash on hands  Psychiatric: Affect normal.    LABORATORY DATA:  I have reviewed the data as listed    Component Value Date/Time   NA 133 (L) 01/13/2019 1127   NA 140 07/13/2013 1542   K 3.9 01/13/2019 1127   K  4.3 07/13/2013 1542   CL 100 01/13/2019 1127   CL 107 07/13/2013 1542   CO2 25 01/13/2019 1127   CO2 26 07/13/2013 1542   GLUCOSE 111 (H) 01/13/2019 1127   GLUCOSE 106 (H) 07/13/2013 1542   BUN 11 01/13/2019 1127  BUN 11 07/13/2013 1542   CREATININE 0.80 01/13/2019 1127   CREATININE 0.76 10/15/2017 1001   CALCIUM 9.5 01/13/2019 1127   CALCIUM 9.9 07/13/2013 1542   PROT 6.4 (L) 01/13/2019 1127   PROT 8.1 07/13/2013 1542   ALBUMIN 3.7 01/13/2019 1127   ALBUMIN 4.3 07/13/2013 1542   AST 14 (L) 01/13/2019 1127   AST 26 07/13/2013 1542   ALT 9 01/13/2019 1127   ALT 32 07/13/2013 1542   ALKPHOS 36 (L) 01/13/2019 1127   ALKPHOS 74 07/13/2013 1542   BILITOT 0.8 01/13/2019 1127   BILITOT 0.4 07/13/2013 1542   GFRNONAA >60 01/13/2019 1127   GFRNONAA 95 10/15/2017 1001   GFRAA >60 01/13/2019 1127   GFRAA 110 10/15/2017 1001    No results found for: SPEP, UPEP  Lab Results  Component Value Date   WBC 5.9 01/13/2019   NEUTROABS 3.6 01/13/2019   HGB 11.3 (L) 01/13/2019   HCT 34.6 (L) 01/13/2019   MCV 94.3 01/13/2019   PLT 242 01/13/2019      Chemistry      Component Value Date/Time   NA 133 (L) 01/13/2019 1127   NA 140 07/13/2013 1542   K 3.9 01/13/2019 1127   K 4.3 07/13/2013 1542   CL 100 01/13/2019 1127   CL 107 07/13/2013 1542   CO2 25 01/13/2019 1127   CO2 26 07/13/2013 1542   BUN 11 01/13/2019 1127   BUN 11 07/13/2013 1542   CREATININE 0.80 01/13/2019 1127   CREATININE 0.76 10/15/2017 1001      Component Value Date/Time   CALCIUM 9.5 01/13/2019 1127   CALCIUM 9.9 07/13/2013 1542   ALKPHOS 36 (L) 01/13/2019 1127   ALKPHOS 74 07/13/2013 1542   AST 14 (L) 01/13/2019 1127   AST 26 07/13/2013 1542   ALT 9 01/13/2019 1127   ALT 32 07/13/2013 1542   BILITOT 0.8 01/13/2019 1127   BILITOT 0.4 07/13/2013 1542       RADIOGRAPHIC STUDIES: I have personally reviewed the radiological images as listed and agreed with the findings in the report. No results found.    ASSESSMENT & PLAN:  Prostate cancer metastatic to bone ([place]) # Castrate resistant prostate cancer metastases to bone. Lupron every 3 months [10/04/2018]. Sep 2020; PSA- 190; rising. PET aux- 10/15- Progressive bone/ Left suprahilar-mediastinal LN. Currently, on Taxotere weekly. STABLE;  PSA- 113; improving; tolerating poorly/see below.  # HOLD Taxotere; Labs today reviewed-as patient is tolerating poorly.  #Extreme fatigue/nausea vomiting-likely from chemotherapy.  Recommend IV fluids; dexamethasone x1 today.  Continue antiemetics at home.  #Epistaxis likely secondary to dry nasal mucosa.;  Recommend nasal spray.  #Mucositis grade 1-2-recommend salt and baking soda rinses.  # Skin rash- ? Sec to Taxotere; question worse.  Continue Kenalog ointment.  # Malignancy related pain- continue fentanyl patch hydrocodone;. Continue fentanyl patch to every 48 hours and continue hydrocodone.  Stable   # DISPOSITION:  # IVFs today; Dex 10 mg  today # 2 week; MD/labs- cbc/cmp 1 day prior in [place]; Taxoetere-Dr. B      No orders of the defined types were placed in this encounter.  All questions were answered. The patient knows to call the clinic with any problems, questions or concerns.      [name], MD 01/14/2019 12:32 PM

## 2019-01-14 NOTE — Telephone Encounter (Signed)
On 1/11-I spoke to patient's wife regarding concerns for nausea vomiting earlier in the day.  Recommend extra dose of Phenergan/Compazine as needed.  Headache with Zofran.  Follow-up as planned.

## 2019-01-14 NOTE — Progress Notes (Signed)
1155: Per Renita Papa RN per Dr. Rogue Bussing, pt to not receive treatment at this time. Pt to receive IVFs and steroids.

## 2019-01-14 NOTE — Assessment & Plan Note (Addendum)
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 Lupron every 3 months [10/04/2018]. Sep 2020; PSA- 190; rising. PET aux- 10/15- Progressive bone/ Left suprahilar-mediastinal LN. Currently, on Taxotere weekly. STABLE;  PSA- 113; improving; tolerating poorly/see below.  # HOLD Taxotere; Labs today reviewed-as patient is tolerating poorly.  #Extreme fatigue/nausea vomiting-likely from chemotherapy.  Recommend IV fluids; dexamethasone x1 today.  Continue antiemetics at home.  #Epistaxis likely secondary to dry nasal mucosa.;  Recommend nasal spray.  #Mucositis grade 1-2-recommend salt and baking soda rinses.  # Skin rash- ? Sec to Taxotere; question worse.  Continue Kenalog ointment.  # Malignancy related pain- continue fentanyl patch hydrocodone;. Continue fentanyl patch to every 48 hours and continue hydrocodone.  Stable   # DISPOSITION:  # IVFs today; Dex 10 mg  today # 2 week; MD/labs- cbc/cmp 1 day prior in [place]; Taxoetere-Dr. Jacinto Reap

## 2019-01-15 ENCOUNTER — Encounter: Payer: Self-pay | Admitting: Family

## 2019-01-15 ENCOUNTER — Ambulatory Visit (INDEPENDENT_AMBULATORY_CARE_PROVIDER_SITE_OTHER): Payer: 59 | Admitting: Family

## 2019-01-15 VITALS — BP 126/70 | HR 75 | Ht 71.0 in | Wt 161.2 lb

## 2019-01-15 DIAGNOSIS — I10 Essential (primary) hypertension: Secondary | ICD-10-CM | POA: Diagnosis not present

## 2019-01-15 DIAGNOSIS — I25118 Atherosclerotic heart disease of native coronary artery with other forms of angina pectoris: Secondary | ICD-10-CM | POA: Diagnosis not present

## 2019-01-15 DIAGNOSIS — Z720 Tobacco use: Secondary | ICD-10-CM

## 2019-01-15 DIAGNOSIS — E785 Hyperlipidemia, unspecified: Secondary | ICD-10-CM

## 2019-01-15 MED ORDER — ATORVASTATIN CALCIUM 80 MG PO TABS: 80.0000 mg | ORAL_TABLET | Freq: Every day | ORAL | 3 refills | Status: AC

## 2019-01-15 MED ORDER — CLOPIDOGREL BISULFATE 75 MG PO TABS: 75.0000 mg | ORAL_TABLET | Freq: Every day | ORAL | 3 refills | Status: AC

## 2019-01-15 MED ORDER — NITROGLYCERIN 0.4 MG SL SUBL: 0.4000 mg | SUBLINGUAL_TABLET | SUBLINGUAL | 3 refills | Status: AC | PRN

## 2019-01-15 NOTE — Progress Notes (Signed)
Office Visit    Patient Name: Robert Short Date of Encounter: 01/15/2019  Primary Care Provider:  Olin Hauser, DO Primary Cardiologist:  Ida Rogue, MD Electrophysiologist:  None   Chief Complaint    Robert Short is a 69 y.o. male with a hx of CAD, HTN, HLD, prostate CA presents today for annual f/u of CAD   Past Medical History    Past Medical History:  Diagnosis Date  . Back pain 10/09/2012  . Bone cancer ([place])   . CAD (coronary artery disease)    a. 08/2015 PCI: LM nl, LAD 20d, LCX nl, RCA 85m, RPAV 95 (3.0x18 Xience Alpine DES);  b. 04/2016 PCI [facility]): RPL 95 (2.75x8 Promus Premier DES).  . Cancer associated pain   . Depression   . History of echocardiogram    a. 08/2016 Echo: EF 50-55%.  Marland Kitchen History of kidney stones   . Hyperlipidemia   . Hypertension   . Joint pain   . Prostate cancer [facility])    a.  s/p prostatectomy ([facility]);  b. bone mets noted 04/2016.  . Right arm pain 01/10/2016  . Right foot pain 01/10/2016  . Right leg pain 01/10/2016  . Tobacco abuse    Past Surgical History:  Procedure Laterality Date  . CARD[facility]AC CATHETERIZATION     [facility]  . CARDIAC CATHETERIZATION N/A 08/16/2015   Procedure: Left Heart Cath and Coronary Angiography;  Surgeon: Yolonda Kida, MD;  Location: [facility];  Service: Cardiovascular;  Laterality: N/A;  . CARDIAC CATHETERIZATION N/A 08/16/2015   Procedure: Coronary Stent Intervention;  Surgeon: Yolonda Kida, MD;  Location: [facility];  Service: Cardiovascular;  Laterality: N/A;  . CERVIX SURGERY    . PROSTATECTOMY    . SPINE SURGERY    . TONSILLECTOMY    . WRIST SURGERY      Allergies  Allergies  Allergen Reactions  . Ditropan [Oxybutynin] Other (See Comments)    Hypotension and muscle weakness    History of Present Illness    Robert Short is a 69 y.o. male with a hx of CAD s/p STEMI and DES to distal RCA, PL branch 04/2015 and RCA 08/2015, HTN, HLD, tobacco abuse last seen by  Dr. [name] 12/04/17.  Denies chest pain, pressure, tightness. Reports no SOB, DOE. Endorses fatigue which he attributes to his recent chemo treatments.   He was recently diagnosed with recurrent prostate cancer and has been undergoing chemotherapy. He has had poor appetite. Has lost weight since his cancer diagnosis.   Present today with his wife. Checks his BP a few times per week at home and reports it has been routinely <130/80.   We discussed his cholesterol panel from Oct with elevated LDL. Tells me he sometimes forgets to take Atorvastatin. Tried Zetia, but had muscle aches and wasn't able to tolerate.   EKGs/Labs/Other Studies Reviewed:   The following studies were reviewed today:   EKG:  EKG is ordered today.  The ekg ordered today demonstrates SR 75 bpm with no acute ST/T wave changes.  Recent Labs: 01/13/2019: ALT 9; BUN 11; Creatinine, Ser 0.80; Hemoglobin 11.3; Platelets 242; Potassium 3.9; Sodium 133  Recent Lipid Panel    Component Value Date/Time   CHOL 266 (H) 10/15/2018 1546   CHOL 204 (H) 07/15/2012 1347   TRIG 165 (H) 10/15/2018 1546   TRIG 226 (H) 07/15/2012 1347   HDL 54 10/15/2018 1546   HDL 53 07/15/2012 1347  CHOLHDL 4.9 10/15/2018 1546   VLDL 33 10/15/2018 1546   VLDL 45 (H) 07/15/2012 1347   LDLCALC 179 (H) 10/15/2018 1546   LDLCALC 85 10/15/2017 1001   LDLCALC 106 (H) 07/15/2012 1347    Home Medications   Current Meds  Medication Sig  . acetaminophen (TYLENOL) 500 MG tablet Take 1,000 mg by mouth every 8 (eight) hours as needed.   Marland Kitchen albuterol (PROVENTIL HFA;VENTOLIN HFA) 108 (90 Base) MCG/ACT inhaler Inhale 1-2 puffs into the lungs every 6 (six) hours as needed for wheezing or shortness of breath.  Marland Kitchen aspirin 81 MG tablet Take 81 mg by mouth daily.  Marland Kitchen atorvastatin (LIPITOR) 80 MG tablet Take 1 tablet (80 mg total) by mouth daily at 6 PM.  . chlorhexidine (PERIDEX) 0.12 % solution   . clopidogrel (PLAVIX) 75 MG tablet Take 1 tablet (75 mg total) by  mouth daily.  . fentaNYL (DURAGESIC) 100 MCG/HR Place 1 patch onto the skin every other day. Along with 87mcg patch for total of 125 mcg every 2 days.  . fentaNYL (DURAGESIC) 25 MCG/HR Place 1 patch onto the skin every other day. Use this along with fentanyl patch 100 mcg [total 125 mcg] every 2 days  . gabapentin (NEURONTIN) 100 MG capsule START 1 CAPSULE DAILY, INCREASE BY 1 CAP EVERY 2 TO 3 DAYS AS TOLERATED UP TO 3 TIMES A DAY, OR MAY TAKE 3 AT ONCE IN [place].  . magnesium hydroxide (MILK OF MAGNESIA) 400 MG/5ML suspension Take 5 mLs by mouth daily as needed for constipation.  . metoprolol tartrate (LOPRESSOR) 25 MG tablet TAKE 1/2 TABLET BY MOUTH 2 TIMES DAILY.  . naloxone (NARCAN) nasal spray 4 mg/0.1 mL 1 spray into nostril upon signs of opioid overdose. Call 911. May repeat once if no response within 2-3 minutes.  . nitroGLYCERIN (NITROSTAT) 0.4 MG SL tablet Place 1 tablet (0.4 mg total) under the tongue every 5 (five) minutes as needed.  Marland Kitchen oxyCODONE (ROXICODONE) 15 MG immediate release tablet TAKE 1 TABLET BY MOUTH EVERY 6 HOURS AS NEEDED FOR PAIN.  Marland Kitchen polyethylene glycol (MIRALAX / GLYCOLAX) packet Take 17 g by mouth daily as needed for moderate constipation.  . predniSONE (DELTASONE) 5 MG tablet TAKE 1 TABLET BY MOUTH DAILY WITH BREAKFAST. (Patient taking differently: Take 2.5 mg by mouth daily with breakfast. )  . promethazine (PHENERGAN) 25 MG tablet TAKE 1/2 TO 1 TABLET (12.5-25 MG TOTAL) BY MOUTH EVERY 8 (EIGHT) HOURS AS NEEDED FOR NAUSEA OR VOMITING.  . traZODone (DESYREL) 50 MG tablet Take 1-2 tablets (50-100 mg total) by mouth at bedtime as needed for sleep.  Marland Kitchen triamcinolone ointment (KENALOG) 0.5 % Apply 1 application topically 2 (two) times daily.  . Wheat Dextrin (BENEFIBER) POWD Stir 2 tsp. TID into 4-8 oz of any non-carbonated beverage or soft food (hot or cold)  . [DISCONTINUED] atorvastatin (LIPITOR) 80 MG tablet TAKE 1 TABLET BY MOUTH DAILY AT 6 PM.  . [DISCONTINUED]  clopidogrel (PLAVIX) 75 MG tablet TAKE 1 TABLET BY MOUTH DAILY  . [DISCONTINUED] nitroGLYCERIN (NITROSTAT) 0.4 MG SL tablet Place 0.4 mg under the tongue every 5 (five) minutes as needed.       Review of Systems      Review of Systems  Constitution: Positive for decreased appetite (secondary to chemo), malaise/fatigue and weight loss. Negative for chills and fever.  Cardiovascular: Negative for chest pain, dyspnea on exertion, leg swelling, near-syncope, orthopnea, palpitations and syncope.  Respiratory: Negative for cough, shortness of breath and wheezing.  Gastrointestinal: Negative for nausea and vomiting.  Neurological: Negative for dizziness, light-headedness and weakness.   All other systems reviewed and are otherwise negative except as noted above.  Physical Exam    VS:  BP 126/70 (BP Location: Left Arm, Patient Position: Sitting, Cuff Size: Normal)   Pulse 75   Ht 5\' 11"  (1.803 m)   Wt 161 lb 4 oz (73.1 kg)   SpO2 98%   BMI 22.49 kg/m  , BMI Body mass index is 22.49 kg/m. GEN: Thin and somewhat frail appearing, well developed, in no acute distress. HEENT: normal. Neck: Supple, no JVD, carotid bruits, or masses. Cardiac: RRR, no murmurs, rubs, or gallops. No clubbing, cyanosis, edema.  Radials/DP/PT 2+ and equal bilaterally.  Respiratory:  Respirations regular and unlabored, clear to auscultation bilaterally. GI: Soft, nontender, nondistended. MS: No deformity or atrophy. Skin: Warm and dry, no rash. Multiple areas of ecchymosis noted to bilateral UE.  Neuro:  Strength and sensation are intact. Psych: Normal affect.  Accessory Clinical Findings    ECG personally reviewed by me today - 75 bpm with no acute ST/T wave changes - no acute changes.  Assessment & Plan    1. CAD - Stable with no anginal symptoms. No indication for ischemic evaluation. Continue GDMT aspirin, plavix, beta blocker statin.  2. HTN - Bp well controlled. Continue present antihypertensive  regimen.  3. HLD - 10/2018 LDL 179. Discussed goal of LDL <70. On Atorvastatin 80mg  daily, endorses taking inconsistently. Intolerant of Zetia. Likely will require referral to lipid clinic for addition of [id number]. Initiated discussion today, he politely requests to complete his chemo treatments over the next few months and discuss at follow up. Assures me he will be more consistent with his Atorvastatin. 4. Prostate cancer - Present chemotherapy. Follows with oncology.  5. Tobacco abuse - Has cut back some. Endorses smoking due to stress. Smoking cessation encouraged. Recommend utilization of [id number].  Disposition: Follow up in 6 month(s) with Dr. [name] or APP for discussion regarding lipids   Loel Dubonnet, NP 01/15/2019, 7:50 PM

## 2019-01-15 NOTE — Patient Instructions (Addendum)
Medication Instructions:  No medication changes today.  *If you need a refill on your cardiac medications before your next appointment, please call your pharmacy*  Lab Work: None ordered today.   Testing/Procedures: You had an EKG today. It showed sinus rhythm with your old heart attack. This is a stable finding.  Follow-Up: At [facility], you and your health needs are our priority.  As part of our continuing mission to provide you with exceptional heart care, we have created designated Provider Care Teams.  These Care Teams include your primary Cardiologist (physician) and Advanced Practice Providers (APPs -  Physician Assistants and Nurse Practitioners) who all work together to provide you with the care you need, when you need it.  Your next appointment:   6 month(s)  The format for your next appointment:   In Person  Provider:    You may see Ida Rogue, MD or one of the following Advanced Practice Providers on your designated Care Team:    Murray Hodgkins, NP  Christell Faith, PA-C  Marrianne Mood, PA-C  Other Instructions  Continue to work on cutting back on the number of cigarettes you smoke. You're doing a great job cutting back!  Your lipid levels were elevated in October. Try to be consistent with your Atorvastatin. We will plan to recheck when we see you in six months. If they are still elevated we could consider a medication called Repatha - this is a twice per week injection that helps reduce your LDL or "bad cholesterol".

## 2019-01-24 ENCOUNTER — Inpatient Hospital Stay (HOSPITAL_BASED_OUTPATIENT_CLINIC_OR_DEPARTMENT_OTHER): Payer: 59 | Admitting: Hospice and Palliative Medicine

## 2019-01-24 DIAGNOSIS — C61 Malignant neoplasm of prostate: Secondary | ICD-10-CM

## 2019-01-24 DIAGNOSIS — Z515 Encounter for palliative care: Secondary | ICD-10-CM | POA: Diagnosis not present

## 2019-01-24 DIAGNOSIS — G893 Neoplasm related pain (acute) (chronic): Secondary | ICD-10-CM

## 2019-01-24 DIAGNOSIS — C7951 Secondary malignant neoplasm of bone: Secondary | ICD-10-CM | POA: Diagnosis not present

## 2019-01-24 MED ORDER — OXYCODONE HCL 15 MG PO TABS: 15.0000 mg | ORAL_TABLET | Freq: Four times a day (QID) | ORAL | 0 refills | Status: DC | PRN

## 2019-01-24 NOTE — Progress Notes (Signed)
Virtual Visit via Telephone Note  I connected with Robert Short on 01/24/19 at 11:30 AM EST by telephone and verified that I am speaking with the correct person using two identifiers.   I discussed the limitations, risks, security and privacy concerns of performing an evaluation and management service by telephone and the availability of in person appointments. I also discussed with the patient that there may be a patient responsible charge related to this service. The patient expressed understanding and agreed to proceed.   History of Present Illness: Mr. Robert Short is a 69 y.o.malewith multiple medical problems including stage IV prostate cancer metastatic to bone status post Xtandi.Currently on treatment with docetaxel.  Patient has had multiple symptoms including fatigue and pain. He was referred to palliative care to help with symptom management and to address goals.   Observations/Objective: Called and spoke with patient by phone.  He reports he is doing reasonably well.  He denies any significant changes or concerns.  He does continue to have chronic fatigue but says that sleep is some better at night.  Docetaxel has been on hold due to symptom burden with plan to resume treatment next week.  Patient reports pain is stable.  He denies issues or adverse effects from pain medications.  He does request refill of oxycodone.  PDMP reviewed.  20-day supply was last filled on 01/01/2019.  Moods are reportedly stable.  Assessment and Plan: Stage IV prostate cancer -plan to resume docetaxel next week.  Patient has follow-up appoint with Dr. Rogue Bussing.  Neoplasm related pain.  Continue transdermal fentanyl with use of oxycodone as needed for breakthrough pain.  Refill oxycodone #90 tablets   Follow Up Instructions: Follow-up telephone visit in 1 to 2 months.   I discussed the assessment and treatment plan with the patient. The patient was provided an opportunity to ask questions and all  were answered. The patient agreed with the plan and demonstrated an understanding of the instructions.   The patient was advised to call back or seek an in-person evaluation if the symptoms worsen or if the condition fails to improve as anticipated.  I provided 5 minutes of non-face-to-face time during this encounter.   Irean Hong, NP

## 2019-01-27 ENCOUNTER — Other Ambulatory Visit: Payer: Self-pay

## 2019-01-28 ENCOUNTER — Other Ambulatory Visit: Payer: Self-pay

## 2019-01-28 ENCOUNTER — Inpatient Hospital Stay: Payer: 59

## 2019-01-28 ENCOUNTER — Other Ambulatory Visit: Payer: 59

## 2019-01-28 DIAGNOSIS — C61 Malignant neoplasm of prostate: Secondary | ICD-10-CM

## 2019-01-28 DIAGNOSIS — C7951 Secondary malignant neoplasm of bone: Secondary | ICD-10-CM

## 2019-01-28 DIAGNOSIS — F329 Major depressive disorder, single episode, unspecified: Secondary | ICD-10-CM | POA: Diagnosis not present

## 2019-01-28 DIAGNOSIS — I2510 Atherosclerotic heart disease of native coronary artery without angina pectoris: Secondary | ICD-10-CM | POA: Diagnosis not present

## 2019-01-28 DIAGNOSIS — E785 Hyperlipidemia, unspecified: Secondary | ICD-10-CM | POA: Diagnosis not present

## 2019-01-28 DIAGNOSIS — F17210 Nicotine dependence, cigarettes, uncomplicated: Secondary | ICD-10-CM | POA: Diagnosis not present

## 2019-01-28 DIAGNOSIS — Z5111 Encounter for antineoplastic chemotherapy: Secondary | ICD-10-CM | POA: Diagnosis not present

## 2019-01-28 DIAGNOSIS — Z79899 Other long term (current) drug therapy: Secondary | ICD-10-CM | POA: Diagnosis not present

## 2019-01-28 DIAGNOSIS — I10 Essential (primary) hypertension: Secondary | ICD-10-CM | POA: Diagnosis not present

## 2019-01-28 LAB — COMPREHENSIVE METABOLIC PANEL
ALT: 13 U/L (ref 0–44)
AST: 16 U/L (ref 15–41)
Albumin: 3.7 g/dL (ref 3.5–5.0)
Alkaline Phosphatase: 44 U/L (ref 38–126)
Anion gap: 9 (ref 5–15)
BUN: 8 mg/dL (ref 8–23)
CO2: 25 mmol/L (ref 22–32)
Calcium: 9.3 mg/dL (ref 8.9–10.3)
Chloride: 105 mmol/L (ref 98–111)
Creatinine, Ser: 0.75 mg/dL (ref 0.61–1.24)
GFR calc Af Amer: >60 mL/min (ref >60)
GFR calc non Af Amer: >60 mL/min (ref >60)
Glucose, Bld: 107 mg/dL — ABNORMAL HIGH (ref 70–99)
Potassium: 3.9 mmol/L (ref 3.5–5.1)
Sodium: 139 mmol/L (ref 135–145)
Total Bilirubin: 0.5 mg/dL (ref 0.3–1.2)
Total Protein: 6.3 g/dL — ABNORMAL LOW (ref 6.5–8.1)

## 2019-01-28 LAB — CBC WITH DIFFERENTIAL/PLATELET
Abs Immature Granulocytes: 0.08 10*3/uL — ABNORMAL HIGH (ref 0.00–0.07)
Basophils Absolute: 0 10*3/uL (ref 0.0–0.1)
Basophils Relative: 1 %
Eosinophils Absolute: 0 10*3/uL (ref 0.0–0.5)
Eosinophils Relative: 0 %
HCT: 34.8 % — ABNORMAL LOW (ref 39.0–52.0)
Hemoglobin: 11.1 g/dL — ABNORMAL LOW (ref 13.0–17.0)
Immature Granulocytes: 1 %
Lymphocytes Relative: 21 %
Lymphs Abs: 1.6 10*3/uL (ref 0.7–4.0)
MCH: 31.3 pg (ref 26.0–34.0)
MCHC: 31.9 g/dL (ref 30.0–36.0)
MCV: 98 fL (ref 80.0–100.0)
Monocytes Absolute: 0.7 10*3/uL (ref 0.1–1.0)
Monocytes Relative: 9 %
Neutro Abs: 5.1 10*3/uL (ref 1.7–7.7)
Neutrophils Relative %: 68 %
Platelets: 238 10*3/uL (ref 150–400)
RBC: 3.55 MIL/uL — ABNORMAL LOW (ref 4.22–5.81)
RDW: 16.5 % — ABNORMAL HIGH (ref 11.5–15.5)
WBC: 7.5 10*3/uL (ref 4.0–10.5)
nRBC: 0 % (ref 0.0–0.2)

## 2019-01-28 LAB — PSA: Prostatic Specific Antigen: 94.1 ng/mL — ABNORMAL HIGH (ref 0.00–4.00)

## 2019-01-28 NOTE — Progress Notes (Signed)
Patient pre screened for office appointment, no questions or concerns today. Patient reminded of upcoming appointment time and date. 

## 2019-01-29 ENCOUNTER — Inpatient Hospital Stay (HOSPITAL_BASED_OUTPATIENT_CLINIC_OR_DEPARTMENT_OTHER): Payer: 59 | Admitting: Internal Medicine

## 2019-01-29 ENCOUNTER — Encounter: Payer: Self-pay | Admitting: Internal Medicine

## 2019-01-29 ENCOUNTER — Other Ambulatory Visit: Payer: Self-pay

## 2019-01-29 ENCOUNTER — Inpatient Hospital Stay: Payer: 59

## 2019-01-29 DIAGNOSIS — C7951 Secondary malignant neoplasm of bone: Secondary | ICD-10-CM | POA: Diagnosis not present

## 2019-01-29 DIAGNOSIS — I2510 Atherosclerotic heart disease of native coronary artery without angina pectoris: Secondary | ICD-10-CM | POA: Diagnosis not present

## 2019-01-29 DIAGNOSIS — F17210 Nicotine dependence, cigarettes, uncomplicated: Secondary | ICD-10-CM | POA: Diagnosis not present

## 2019-01-29 DIAGNOSIS — E785 Hyperlipidemia, unspecified: Secondary | ICD-10-CM | POA: Diagnosis not present

## 2019-01-29 DIAGNOSIS — I25118 Atherosclerotic heart disease of native coronary artery with other forms of angina pectoris: Secondary | ICD-10-CM

## 2019-01-29 DIAGNOSIS — C61 Malignant neoplasm of prostate: Secondary | ICD-10-CM

## 2019-01-29 DIAGNOSIS — Z5111 Encounter for antineoplastic chemotherapy: Secondary | ICD-10-CM | POA: Diagnosis not present

## 2019-01-29 DIAGNOSIS — I10 Essential (primary) hypertension: Secondary | ICD-10-CM | POA: Diagnosis not present

## 2019-01-29 DIAGNOSIS — F329 Major depressive disorder, single episode, unspecified: Secondary | ICD-10-CM | POA: Diagnosis not present

## 2019-01-29 DIAGNOSIS — Z7189 Other specified counseling: Secondary | ICD-10-CM

## 2019-01-29 DIAGNOSIS — Z79899 Other long term (current) drug therapy: Secondary | ICD-10-CM | POA: Diagnosis not present

## 2019-01-29 MED ORDER — SODIUM CHLORIDE 0.9 % IV SOLN
Freq: Once | INTRAVENOUS | Status: AC
  Filled 2019-01-29: qty 250

## 2019-01-29 MED ORDER — SODIUM CHLORIDE 0.9 % IV SOLN
36.0000 mg/m2 | Freq: Once | INTRAVENOUS | Status: AC
  Administered 2019-01-29: 70 mg via INTRAVENOUS
  Filled 2019-01-29: qty 7

## 2019-01-29 MED ORDER — DEXAMETHASONE SODIUM PHOSPHATE 10 MG/ML IJ SOLN
10.0000 mg | Freq: Once | INTRAMUSCULAR | Status: AC
  Administered 2019-01-29: 10 mg via INTRAVENOUS
  Filled 2019-01-29: qty 1

## 2019-01-29 NOTE — Progress Notes (Signed)
[place] OFFICE PROGRESS NOTE  Patient Care Team: Olin Hauser, [name], MD as PCP - Cardiology (Cardiology) Minna Merritts, [name], [name]  Cancer Staging No matching staging information was found for the patient.   Oncology History Overview Note  # 2011- PROSTATE CANCER [Gleason 3+4]; s/p Prostatectomy [ also involved bladder neck/ECP; Dr.Polaseck]; July 2014- Biochemical recurrence [PSA 14]- started on Zoladex [Dr.Pandit]; lost to follow up.  # JAN 2017- STAGE IV METASTATIC PROSTATE Cancer to Bone- Feb 13th, 2017-  Lupron q 30m[~end of feb]; PSA: 1021; Declined Chemo; April 2017 [xofigo x6; Dr.Crystal]; AUG 2017- Zytiga + Prednisone BID. Bone scan-Jan 2018- improved skeletal metastases.   # MAY 1st 2019- START X-tandi [stopped zytiga/PSA- 8.8/rising]  #August 23, 2018-stop Xtandi [intolerance-fatigue mental fogginess]  #Mid September 2020-apalutamide; stopped mid-October poor tolerance/progression  # 11/01/2018- -Taxotere weekly [consent]  # Mets to bone- start X-geva q 57M [May 30th]; hold dental issues/infections  # Smoker/ chronic pain/pain clinic   #[April 2018; [place]]- s/p stenting; on Plavix; --------------------------------------------------------------  DIAGNOSIS: '[ ]'  Castrate resistant prostate cancer  STAGE:   4    ;GOALS: Palliative  CURRENT/MOST RECENT THERAPY-Late Oct 2020- Taxotere weekly [consent]    [place] to bone ([facility]  12/08/2014 Initial Diagnosis   [place] bone ([facility]   11/01/2018 -  Chemotherapy   The patient had DOCEtaxel (TAXOTERE) 70 mg in sodium chloride 0.9 % 150 mL chemo infusion, 36 mg/m2 = 70 mg, Intravenous,  Once, 8 of 18 cycles Administration: 70 mg (11/08/2018), 70 mg (11/22/2018), 70 mg (12/13/2018), 70 mg (12/06/2018),  70 mg (12/31/2018), 70 mg (01/07/2019), 70 mg (12/20/2018)  for chemotherapy treatment.     INTERVAL HISTORY:  Robert [id number].o.  male pleasant patient above history of metastatic castrate resistant prostate cancer currently -on Taxotere weekly is here for follow-up.  Taxotere was held last week because of poor tolerance skin rash.  Patient's appetite is improving.  He is gaining mild weight.  Skin rash is improved.   Continues to chronic back pain joint pains.  No headaches.  No worsening tingling or numbness.   Review of Systems  Constitutional: Positive for malaise/fatigue and weight loss. Negative for chills, diaphoresis and fever.  HENT: Negative for nosebleeds and sore throat.   Eyes: Negative for double vision.  Respiratory: Negative for cough, hemoptysis, sputum production and wheezing.   Cardiovascular: Negative for chest pain, palpitations, orthopnea and leg swelling.  Gastrointestinal: Positive for nausea. Negative for abdominal pain, blood in stool, diarrhea, heartburn, melena and vomiting.  Genitourinary: Negative for dysuria, frequency and urgency.  Musculoskeletal: Positive for back pain and joint pain.  Skin: Positive for rash. Negative for itching.  Neurological: Negative for dizziness, tingling, focal weakness, weakness and headaches.  Endo/Heme/Allergies: Does not bruise/bleed easily.  Psychiatric/Behavioral: Negative for depression. The patient is not nervous/anxious and does not have insomnia.       PAST MEDICAL HISTORY :  Past Medical History:  Diagnosis Date  . Back pain 10/09/2012  . [place] cancer [place]   . CAD (coronary artery disease)    a. 08/2015 PCI: LM nl, LAD 20d, LCX nl, RCA 252mRPAV 95 (3.0x18 Xience Alpine DES);  b. [facility]2.75[facility]8 Promus Premier DES).  . Cancer associated pain   . Depression   . History of echocardiogram  a. 08/2016 Echo: EF 50-55%.  Marland Kitchen History of kidney stones   . Hyperlipidemia   . Hypertension   .  Joint pain   . Prostate cancer [facility])    a.  s/p prostatectomy ([facility]);  b. bone mets noted 04/2016.  . Right arm pain 01/10/2016  . Right foot pain 01/10/2016  . Right leg pain 01/10/2016  . Tobacco abuse     PAST SURGICAL HISTORY :   Past Surgical History:  Procedure Laterality Date  . CARDIAC CATHETERIZATION     [facility]  . CARDIAC CATHETERIZATION N/A 08/16/2015   Procedure: Left Heart Cath and Coronary Angiography;  Surgeon: Yolonda Kida, MD;  Location: [place] CV LAB;  Service: Cardiovascular;  Laterality: N/A;  . CARDIAC CATHETERIZATION N/A 08/16/2015   Procedure: Coronary Stent Intervention;  Surgeon: Yolonda Kida, MD;  Location: [place] [facility];  Service: Cardiovascular;  Laterality: N/A;  . CERVIX SURGERY    . PROSTATECTOMY    . SPINE SURGERY    . TONSILLECTOMY    . WRIST SURGERY      FAMILY HISTORY :   Family History  Problem Relation Age of Onset  . Heart attack Mother   . Hypertension Mother   . Heart attack Father     SOCIAL HISTORY:   Social History   Tobacco Use  . Smoking status: Current Every Day Smoker    Packs/day: 0.50    Years: 40.00    Pack years: 20.00    Types: Cigarettes  . Smokeless tobacco: Current User    Types: Chew  . Tobacco comment: 1 pack every couple days   Substance Use Topics  . Alcohol use: Yes    Comment: occasionally  . Drug use: No    ALLERGIES:  is allergic to ditropan [oxybutynin].  MEDICATIONS:  Current Outpatient Medications  Medication Sig Dispense Refill  . acetaminophen (TYLENOL) 500 MG tablet Take 1,000 mg by mouth every 8 (eight) hours as needed.     Marland Kitchen albuterol (PROVENTIL HFA;VENTOLIN HFA) 108 (90 Base) MCG/ACT inhaler Inhale 1-2 puffs into the lungs every 6 (six) hours as needed for wheezing or shortness of breath. 1 Inhaler 0  . aspirin 81 MG tablet Take 81 mg by mouth daily.    Marland Kitchen atorvastatin (LIPITOR) 80 MG tablet Take 1 tablet (80 mg total) by mouth daily at 6 PM. 90 tablet 3  .  chlorhexidine (PERIDEX) 0.12 % solution     . clopidogrel (PLAVIX) 75 MG tablet Take 1 tablet (75 mg total) by mouth daily. 90 tablet 3  . fentaNYL (DURAGESIC) 100 MCG/HR Place 1 patch onto the skin every other day. Along with 12mg patch for total of 125 mcg every 2 days. 15 patch 0  . fentaNYL (DURAGESIC) 25 MCG/HR Place 1 patch onto the skin every other day. Use this along with fentanyl patch 100 mcg [total 125 mcg] every 2 days 15 patch 0  . gabapentin (NEURONTIN) 100 MG capsule START 1 CAPSULE DAILY, INCREASE BY 1 CAP EVERY 2 TO 3 DAYS AS TOLERATED UP TO 3 TIMES A DAY, OR MAY TAKE 3 AT ONCE IN [place] 90 capsule 5  . magnesium hydroxide (MILK OF MAGNESIA) 400 MG/5ML suspension Take 5 mLs by mouth daily as needed for constipation.    . metoprolol tartrate (LOPRESSOR) 25 MG tablet TAKE 1/2 TABLET BY MOUTH 2 TIMES DAILY. 90 tablet 1  . naloxone (NARCAN) nasal spray 4 mg/0.1 mL 1 spray into nostril upon signs of opioid overdose. Call  911. May repeat once if no response within 2-3 minutes. 1 kit 0  . nitroGLYCERIN (NITROSTAT) 0.4 MG SL tablet Place 1 tablet (0.4 mg total) under the tongue every 5 (five) minutes as needed. 25 tablet 3  . oxyCODONE (ROXICODONE) 15 MG immediate release tablet Take 1 tablet (15 mg total) by mouth every 6 (six) hours as needed. for pain 90 tablet 0  . polyethylene glycol (MIRALAX / GLYCOLAX) packet Take 17 g by mouth daily as needed for moderate constipation.    . predniSONE (DELTASONE) 5 MG tablet TAKE 1 TABLET BY MOUTH DAILY WITH BREAKFAST. (Patient taking differently: Take 2.5 mg by mouth daily with breakfast. ) 30 tablet 1  . promethazine (PHENERGAN) 25 MG tablet TAKE 1/2 TO 1 TABLET (12.5-25 MG TOTAL) BY MOUTH EVERY 8 (EIGHT) HOURS AS NEEDED FOR NAUSEA OR VOMITING. 60 tablet 3  . traZODone (DESYREL) 50 MG tablet Take 1-2 tablets (50-100 mg total) by mouth at bedtime as needed for sleep. 60 tablet 2  . triamcinolone ointment (KENALOG) 0.5 % Apply 1 application  topically 2 (two) times daily. 30 g 0  . Wheat Dextrin (BENEFIBER) POWD Stir 2 tsp. TID into 4-8 oz of any non-carbonated beverage or soft food (hot or cold) 500 g PRN   No current facility-administered medications for this visit.    PHYSICAL EXAMINATION: ECOG PERFORMANCE STATUS: 1 - Symptomatic but completely ambulatory  BP (!) 153/87 (BP Location: Left Arm, Patient Position: Sitting, Cuff Size: Normal)   Pulse 79   Temp (!) 97.5 F (36.4 C) (Tympanic)   Wt 164 lb (74.4 kg)   BMI 22.87 kg/m   Filed Weights   01/29/19 0914  Weight: 164 lb (74.4 kg)    Physical Exam  Constitutional: He is oriented to person, place, and time.   Frail-appearing Caucasian male patient.  He is in a walking with a cane.   He is alone.  HENT:  Head: Normocephalic and atraumatic.  Mouth/Throat: Oropharynx is clear and moist. No oropharyngeal exudate.  Poor dentition.  Eyes: Pupils are equal, round, and reactive to light.  Cardiovascular: Normal rate and regular rhythm.  Pulmonary/Chest: No respiratory distress. He has no wheezes.  Decreased breath sounds bilaterally at the bases.  No wheeze or crackles  Abdominal: Soft. Bowel sounds are normal. He exhibits no distension and no mass. There is no abdominal tenderness. There is no rebound and no guarding.  Musculoskeletal:        General: No tenderness or edema. Normal range of motion.     Cervical back: Normal range of motion and neck supple.  Neurological: He is alert and oriented to person, place, and time.  Skin: Skin is warm.  Macular rash on hands  Psychiatric: Affect normal.    LABORATORY DATA:  I have reviewed the data as listed    Component Value Date/Time   NA 139 01/28/2019 1406   NA 140 07/13/2013 1542   K 3.9 01/28/2019 1406   K 4.3 07/13/2013 1542   CL 105 01/28/2019 1406   CL 107 07/13/2013 1542   CO2 25 01/28/2019 1406   CO2 26 07/13/2013 1542   GLUCOSE 107 (H) 01/28/2019 1406   GLUCOSE 106 (H) 07/13/2013 1542   BUN 8  01/28/2019 1406   BUN 11 07/13/2013 1542   CREATININE 0.75 01/28/2019 1406   CREATININE 0.76 10/15/2017 1001   CALCIUM 9.3 01/28/2019 1406   CALCIUM 9.9 07/13/2013 1542   PROT 6.3 (L) 01/28/2019 1406   PROT 8.1 07/13/2013  1542   ALBUMIN 3.7 01/28/2019 1406   ALBUMIN 4.3 07/13/2013 1542   AST 16 01/28/2019 1406   AST 26 07/13/2013 1542   ALT 13 01/28/2019 1406   ALT 32 07/13/2013 1542   ALKPHOS 44 01/28/2019 1406   ALKPHOS 74 07/13/2013 1542   BILITOT 0.5 01/28/2019 1406   BILITOT 0.4 07/13/2013 1542   GFRNONAA >60 01/28/2019 1406   GFRNONAA 95 10/15/2017 1001   GFRAA >60 01/28/2019 1406   GFRAA 110 10/15/2017 1001    No results found for: SPEP, UPEP  Lab Results  Component Value Date   WBC 7.5 01/28/2019   NEUTROABS 5.1 01/28/2019   HGB 11.1 (L) 01/28/2019   HCT 34.8 (L) 01/28/2019   MCV 98.0 01/28/2019   PLT 238 01/28/2019      Chemistry      Component Value Date/Time   NA 139 01/28/2019 1406   NA 140 07/13/2013 1542   K 3.9 01/28/2019 1406   K 4.3 07/13/2013 1542   CL 105 01/28/2019 1406   CL 107 07/13/2013 1542   CO2 25 01/28/2019 1406   CO2 26 07/13/2013 1542   BUN 8 01/28/2019 1406   BUN 11 07/13/2013 1542   CREATININE 0.75 01/28/2019 1406   CREATININE 0.76 10/15/2017 1001      Component Value Date/Time   CALCIUM 9.3 01/28/2019 1406   CALCIUM 9.9 07/13/2013 1542   ALKPHOS 44 01/28/2019 1406   ALKPHOS 74 07/13/2013 1542   AST 16 01/28/2019 1406   AST 26 07/13/2013 1542   ALT 13 01/28/2019 1406   ALT 32 07/13/2013 1542   BILITOT 0.5 01/28/2019 1406   BILITOT 0.4 07/13/2013 1542     RADIOGRAPHIC STUDIES: I have personally reviewed the radiological images as listed and agreed with the findings in the report. No results found.   ASSESSMENT & PLAN:  Prostate cancer metastatic to bone ([place]) # Castrate resistant prostate cancer metastases to bone. Lupron every 3 months [10/04/2018]. Sep 2020; PSA- 190; rising. PET aux- 10/15- Progressive bone/ Left  suprahilar-mediastinal LN. Currently, on Taxotere weekly. STABLE;  PSA- 94 improving.  # Proceed with taxotere; Labs today reviewed;  acceptable for treatment today.   #Extreme fatigue/nausea vomiting-likely from chemotherapy- improved; monitor for now.   #Epistaxis likely secondary to dry nasal mucosa.- improved.  #Mucositis grade 1-2-recommend salt and baking soda rinses.  # Skin rash- Sec to Taxotere; question worse- improved.   # Malignancy related pain- continue fentanyl patch hydrocodone;. Continue fentanyl patch to every 48 hours and continue hydrocodone.  STABLE.   # DISPOSITION:  # Taxotere today # Feb 4th- MD; Reva Bores- cbc/cmp 1 day prior in [place];]Taxoetere-Dr. B     No orders of the defined types were placed in this encounter.  All questions were answered. The patient knows to call the clinic with any problems, questions or concerns.      [name], MD 01/29/2019 9:44 AM

## 2019-01-29 NOTE — Assessment & Plan Note (Addendum)
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 Lupron every 3 months [10/04/2018]. Sep 2020; PSA- 190; rising. PET aux- 10/15- Progressive bone/ Left suprahilar-mediastinal LN. Currently, on Taxotere weekly. STABLE;  PSA- 94 improving.  # Proceed with taxotere; Labs today reviewed;  acceptable for treatment today.   #Extreme fatigue/nausea vomiting-likely from chemotherapy- improved; monitor for now.   #Epistaxis likely secondary to dry nasal mucosa.- improved.  #Mucositis grade 1-2-recommend salt and baking soda rinses.  # Skin rash- Sec to Taxotere; question worse- improved.   # Malignancy related pain- continue fentanyl patch hydrocodone;. Continue fentanyl patch to every 48 hours and continue hydrocodone.  STABLE.   # DISPOSITION:  # Taxotere today # Feb 4th- MD; Reva Bores- cbc/cmp 1 day prior in [place];]Taxoetere-Dr. B

## 2019-02-05 ENCOUNTER — Inpatient Hospital Stay: Payer: 59

## 2019-02-05 ENCOUNTER — Other Ambulatory Visit: Payer: Self-pay

## 2019-02-05 DIAGNOSIS — M2550 Pain in unspecified joint: Secondary | ICD-10-CM | POA: Diagnosis not present

## 2019-02-05 DIAGNOSIS — R21 Rash and other nonspecific skin eruption: Secondary | ICD-10-CM | POA: Insufficient documentation

## 2019-02-05 DIAGNOSIS — C7951 Secondary malignant neoplasm of bone: Secondary | ICD-10-CM | POA: Insufficient documentation

## 2019-02-05 DIAGNOSIS — Z7952 Long term (current) use of systemic steroids: Secondary | ICD-10-CM | POA: Insufficient documentation

## 2019-02-05 DIAGNOSIS — F329 Major depressive disorder, single episode, unspecified: Secondary | ICD-10-CM | POA: Insufficient documentation

## 2019-02-05 DIAGNOSIS — Z5111 Encounter for antineoplastic chemotherapy: Secondary | ICD-10-CM | POA: Diagnosis not present

## 2019-02-05 DIAGNOSIS — R110 Nausea: Secondary | ICD-10-CM | POA: Insufficient documentation

## 2019-02-05 DIAGNOSIS — I10 Essential (primary) hypertension: Secondary | ICD-10-CM | POA: Insufficient documentation

## 2019-02-05 DIAGNOSIS — M549 Dorsalgia, unspecified: Secondary | ICD-10-CM | POA: Diagnosis not present

## 2019-02-05 DIAGNOSIS — Z7982 Long term (current) use of aspirin: Secondary | ICD-10-CM | POA: Insufficient documentation

## 2019-02-05 DIAGNOSIS — E785 Hyperlipidemia, unspecified: Secondary | ICD-10-CM | POA: Insufficient documentation

## 2019-02-05 DIAGNOSIS — Z79899 Other long term (current) drug therapy: Secondary | ICD-10-CM | POA: Insufficient documentation

## 2019-02-05 DIAGNOSIS — Z87442 Personal history of urinary calculi: Secondary | ICD-10-CM | POA: Insufficient documentation

## 2019-02-05 DIAGNOSIS — G8929 Other chronic pain: Secondary | ICD-10-CM | POA: Diagnosis not present

## 2019-02-05 DIAGNOSIS — F17210 Nicotine dependence, cigarettes, uncomplicated: Secondary | ICD-10-CM | POA: Insufficient documentation

## 2019-02-05 DIAGNOSIS — Z9079 Acquired absence of other genital organ(s): Secondary | ICD-10-CM | POA: Insufficient documentation

## 2019-02-05 DIAGNOSIS — Z8249 Family history of ischemic heart disease and other diseases of the circulatory system: Secondary | ICD-10-CM | POA: Insufficient documentation

## 2019-02-05 DIAGNOSIS — C61 Malignant neoplasm of prostate: Secondary | ICD-10-CM | POA: Insufficient documentation

## 2019-02-05 LAB — CBC WITH DIFFERENTIAL/PLATELET
Abs Immature Granulocytes: 0.02 10*3/uL (ref 0.00–0.07)
Basophils Absolute: 0 10*3/uL (ref 0.0–0.1)
Basophils Relative: 1 %
Eosinophils Absolute: 0.1 10*3/uL (ref 0.0–0.5)
Eosinophils Relative: 1 %
HCT: 33.4 % — ABNORMAL LOW (ref 39.0–52.0)
Hemoglobin: 10.6 g/dL — ABNORMAL LOW (ref 13.0–17.0)
Immature Granulocytes: 0 %
Lymphocytes Relative: 28 %
Lymphs Abs: 1.4 10*3/uL (ref 0.7–4.0)
MCH: 30.6 pg (ref 26.0–34.0)
MCHC: 31.7 g/dL (ref 30.0–36.0)
MCV: 96.5 fL (ref 80.0–100.0)
Monocytes Absolute: 0.3 10*3/uL (ref 0.1–1.0)
Monocytes Relative: 6 %
Neutro Abs: 3.2 10*3/uL (ref 1.7–7.7)
Neutrophils Relative %: 64 %
Platelets: 182 10*3/uL (ref 150–400)
RBC: 3.46 MIL/uL — ABNORMAL LOW (ref 4.22–5.81)
RDW: 16.2 % — ABNORMAL HIGH (ref 11.5–15.5)
WBC: 4.9 10*3/uL (ref 4.0–10.5)
nRBC: 0 % (ref 0.0–0.2)

## 2019-02-05 LAB — COMPREHENSIVE METABOLIC PANEL
ALT: 11 U/L (ref 0–44)
AST: 17 U/L (ref 15–41)
Albumin: 3.5 g/dL (ref 3.5–5.0)
Alkaline Phosphatase: 33 U/L — ABNORMAL LOW (ref 38–126)
Anion gap: 8 (ref 5–15)
BUN: 8 mg/dL (ref 8–23)
CO2: 24 mmol/L (ref 22–32)
Calcium: 9.1 mg/dL (ref 8.9–10.3)
Chloride: 103 mmol/L (ref 98–111)
Creatinine, Ser: 0.65 mg/dL (ref 0.61–1.24)
GFR calc Af Amer: >60 mL/min (ref >60)
GFR calc non Af Amer: >60 mL/min (ref >60)
Glucose, Bld: 155 mg/dL — ABNORMAL HIGH (ref 70–99)
Potassium: 4.2 mmol/L (ref 3.5–5.1)
Sodium: 135 mmol/L (ref 135–145)
Total Bilirubin: 0.7 mg/dL (ref 0.3–1.2)
Total Protein: 5.8 g/dL — ABNORMAL LOW (ref 6.5–8.1)

## 2019-02-05 LAB — PSA: Prostatic Specific Antigen: 86.47 ng/mL — ABNORMAL HIGH (ref 0.00–4.00)

## 2019-02-06 ENCOUNTER — Inpatient Hospital Stay: Payer: 59 | Attending: Internal Medicine | Admitting: Internal Medicine

## 2019-02-06 ENCOUNTER — Telehealth: Payer: Self-pay

## 2019-02-06 ENCOUNTER — Inpatient Hospital Stay: Payer: 59

## 2019-02-06 ENCOUNTER — Encounter: Payer: Self-pay | Admitting: Internal Medicine

## 2019-02-06 ENCOUNTER — Other Ambulatory Visit: Payer: Self-pay

## 2019-02-06 DIAGNOSIS — R21 Rash and other nonspecific skin eruption: Secondary | ICD-10-CM | POA: Insufficient documentation

## 2019-02-06 DIAGNOSIS — G893 Neoplasm related pain (acute) (chronic): Secondary | ICD-10-CM | POA: Diagnosis not present

## 2019-02-06 DIAGNOSIS — C7951 Secondary malignant neoplasm of bone: Secondary | ICD-10-CM | POA: Diagnosis not present

## 2019-02-06 DIAGNOSIS — Z5111 Encounter for antineoplastic chemotherapy: Secondary | ICD-10-CM | POA: Insufficient documentation

## 2019-02-06 DIAGNOSIS — Z7982 Long term (current) use of aspirin: Secondary | ICD-10-CM | POA: Insufficient documentation

## 2019-02-06 DIAGNOSIS — Z7189 Other specified counseling: Secondary | ICD-10-CM

## 2019-02-06 DIAGNOSIS — F17210 Nicotine dependence, cigarettes, uncomplicated: Secondary | ICD-10-CM | POA: Insufficient documentation

## 2019-02-06 DIAGNOSIS — C61 Malignant neoplasm of prostate: Secondary | ICD-10-CM | POA: Diagnosis not present

## 2019-02-06 DIAGNOSIS — M2550 Pain in unspecified joint: Secondary | ICD-10-CM | POA: Insufficient documentation

## 2019-02-06 DIAGNOSIS — I25118 Atherosclerotic heart disease of native coronary artery with other forms of angina pectoris: Secondary | ICD-10-CM

## 2019-02-06 DIAGNOSIS — Z79899 Other long term (current) drug therapy: Secondary | ICD-10-CM | POA: Diagnosis not present

## 2019-02-06 DIAGNOSIS — F329 Major depressive disorder, single episode, unspecified: Secondary | ICD-10-CM | POA: Insufficient documentation

## 2019-02-06 DIAGNOSIS — G8929 Other chronic pain: Secondary | ICD-10-CM | POA: Insufficient documentation

## 2019-02-06 DIAGNOSIS — I10 Essential (primary) hypertension: Secondary | ICD-10-CM | POA: Insufficient documentation

## 2019-02-06 DIAGNOSIS — E785 Hyperlipidemia, unspecified: Secondary | ICD-10-CM | POA: Insufficient documentation

## 2019-02-06 DIAGNOSIS — Z9079 Acquired absence of other genital organ(s): Secondary | ICD-10-CM | POA: Insufficient documentation

## 2019-02-06 DIAGNOSIS — M549 Dorsalgia, unspecified: Secondary | ICD-10-CM | POA: Insufficient documentation

## 2019-02-06 MED ORDER — SODIUM CHLORIDE 0.9 % IV SOLN
Freq: Once | INTRAVENOUS | Status: AC
  Filled 2019-02-06: qty 250

## 2019-02-06 MED ORDER — FENTANYL 25 MCG/HR TD PT72: 1.0000 | MEDICATED_PATCH | TRANSDERMAL | 0 refills | Status: DC

## 2019-02-06 MED ORDER — FENTANYL 100 MCG/HR TD PT72: 1.0000 | MEDICATED_PATCH | TRANSDERMAL | 0 refills | Status: DC

## 2019-02-06 MED ORDER — DEXAMETHASONE SODIUM PHOSPHATE 10 MG/ML IJ SOLN
10.0000 mg | Freq: Once | INTRAMUSCULAR | Status: AC
  Administered 2019-02-06: 10 mg via INTRAVENOUS
  Filled 2019-02-06: qty 1

## 2019-02-06 MED ORDER — SODIUM CHLORIDE 0.9 % IV SOLN
36.0000 mg/m2 | Freq: Once | INTRAVENOUS | Status: AC
  Administered 2019-02-06: 70 mg via INTRAVENOUS
  Filled 2019-02-06: qty 7

## 2019-02-06 NOTE — Assessment & Plan Note (Addendum)
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 Lupron every 3 months [10/04/2018]. Sep 2020; PSA- 190; rising. PET aux- 10/15- Progressive bone/ Left suprahilar-mediastinal LN. Currently, on Taxotere weekly. Clinically, STABLE.  PSA- 86 improving.  # Proceed with taxotere # 10 Labs today reviewed;  acceptable for treatment today; will plan 3 weeks-on and 1 week-off; [off in 2 weeks].  Discussed that we will plan total of 18 treatments weekly.  # Extreme fatigue/nausea vomiting-likely from chemotherapy-STABLE.   # Skin rash- Sec to Taxotere; resolved.   # Malignancy related pain- continue fentanyl patch hydrocodone;. Continue fentanyl patch to every 48 hours and continue hydrocodone.  STABLE.   # DISPOSITION:  # Taxotere today # Feb 11 th- MD; Reva Bores- cbc/cmp 1 day prior in [place];]Taxoetere-Dr. B

## 2019-02-06 NOTE — Telephone Encounter (Signed)
Telephone call to schedule palliative care visit.  Patient nor wife answered phone and voice mail was not set up. RN will continue to reach patient.

## 2019-02-06 NOTE — Progress Notes (Signed)
[place] OFFICE PROGRESS NOTE  Patient Care Team: Olin Hauser, [name], MD as PCP - Cardiology (Cardiology) Minna Merritts, [name], [name]  Cancer Staging No matching staging information was found for the patient.   Oncology History Overview Note  # 2011- PROSTATE CANCER [Gleason 3+4]; s/p Prostatectomy [ also involved bladder neck/ECP; Dr.Polaseck]; July 2014- Biochemical recurrence [PSA 14]- started on Zoladex [Dr.Pandit]; lost to follow up.  # JAN 2017- STAGE IV METASTATIC PROSTATE Cancer to Bone- Feb 13th, 2017-  Lupron q 73m[~end of feb]; PSA: 1021; Declined Chemo; April 2017 [xofigo x6; Dr.Crystal]; AUG 2017- Zytiga + Prednisone BID. Bone scan-Jan 2018- improved skeletal metastases.   # MAY 1st 2019- START X-tandi [stopped zytiga/PSA- 8.8/rising]  #August 23, 2018-stop Xtandi [intolerance-fatigue mental fogginess]  #Mid September 2020-apalutamide; stopped mid-October poor tolerance/progression  # 11/01/2018- -Taxotere weekly [consent]  # Mets to bone- start X-geva q 62M [May 30th]; hold dental issues/infections  # Smoker/ chronic pain/pain clinic   #[April 2018; [place]]- s/p stenting; on Plavix; --------------------------------------------------------------  DIAGNOSIS: _0  Castrate resistant prostate cancer  STAGE:   4    ;GOALS: Palliative  CURRENT/MOST RECENT THERAPY-Late Oct 2020- Taxotere weekly [consent]    [place]  12/08/2014 Initial Diagnosis   [place]   11/01/2018 -  Chemotherapy   The patient had DOCEtaxel (TAXOTERE) 70 mg in sodium chloride 0.9 % 150 mL chemo infusion, 36 mg/m2 = 70 mg, Intravenous,  Once, 9 of 18 cycles Administration: 70 mg (11/08/2018), 70 mg (11/22/2018), 70 mg (12/13/2018), 70 mg (12/06/2018),  70 mg (12/31/2018), 70 mg (01/07/2019), 70 mg (01/29/2019), 70 mg (12/20/2018)  for chemotherapy treatment.     INTERVAL HISTORY:  Robert WESTERHOLD89y.o.  male pleasant patient above history of metastatic castrate resistant prostate cancer currently -on Taxotere weekly is here for follow-up.  Patient states he has been more active the last week or so.  Pain is under stable-continues to be on narcotic pain medications/fentanyl patch.  Appetite is good.  No weight loss.  No nausea no vomiting.  Skin rash improved.  No worsening tingling numbness in extremities.  Review of Systems  Constitutional: Positive for malaise/fatigue and weight loss. Negative for chills, diaphoresis and fever.  HENT: Negative for nosebleeds and sore throat.   Eyes: Negative for double vision.  Respiratory: Negative for cough, hemoptysis, sputum production and wheezing.   Cardiovascular: Negative for chest pain, palpitations, orthopnea and leg swelling.  Gastrointestinal: Positive for nausea. Negative for abdominal pain, blood in stool, diarrhea, heartburn, melena and vomiting.  Genitourinary: Negative for dysuria, frequency and urgency.  Musculoskeletal: Positive for back pain and joint pain.  Skin: Positive for rash. Negative for itching.  Neurological: Negative for dizziness, tingling, focal weakness, weakness and headaches.  Endo/Heme/Allergies: Does not bruise/bleed easily.  Psychiatric/Behavioral: Negative for depression. The patient is not nervous/anxious and does not have insomnia.       PAST MEDICAL HISTORY :  Past Medical History:  Diagnosis Date  . Back pain 10/09/2012  . Bone cancer ([place]   . CAD (coronary artery disease)    a. 08/2015 PCI: LM nl, LAD 20d, LCX nl, RCA 219mRPAV 95 (3.0x18 Xience Alpine DES);  [facility]2.75x8 Promus Premier DES).  . Cancer associated pain   . Depression   .  History of echocardiogram    a. 08/2016 Echo: EF 50-55%.  Marland Kitchen History of kidney stones   .  Hyperlipidemia   . Hypertension   . Joint pain   . Prostate cancer [facility])    a.  s/p prostatectomy ([facility]);  b. bone mets noted 04/2016.  . Right arm pain 01/10/2016  . Right foot pain 01/10/2016  . Right leg pain 01/10/2016  . Tobacco abuse     PAST SURGICAL HISTORY :   Past Surgical History:  Procedure Laterality Date  . CARDIAC CATHETERIZATION     [facility]  . CARDIAC CATHETERIZATION N/A 08/16/2015   Procedure: Left Heart Cath and Coronary Angiography;  Surgeon: Yolonda Kida, MD;  Location: [place] CV LAB;  Service: Cardiovascular;  Laterality: N/A;  . CARDIAC CATHETERIZATION N/A 08/16/2015   Procedure: Coronary Stent Intervention;  Surgeon: Yolonda Kida, MD;  Location: [place] CV LAB;  Service: Cardiovascular;  Laterality: N/A;  . CERVIX SURGERY    . PROSTATECTOMY    . SPINE SURGERY    . TONSILLECTOMY    . WRIST SURGERY      FAMILY HISTORY :   Family History  Problem Relation Age of Onset  . Heart attack Mother   . Hypertension Mother   . Heart attack Father     SOCIAL HISTORY:   Social History   Tobacco Use  . Smoking status: Current Every Day Smoker    Packs/day: 0.50    Years: 40.00    Pack years: 20.00    Types: Cigarettes  . Smokeless tobacco: Current User    Types: Chew  . Tobacco comment: 1 pack every couple days   Substance Use Topics  . Alcohol use: Yes    Comment: occasionally  . Drug use: No    ALLERGIES:  is allergic to ditropan [oxybutynin].  MEDICATIONS:  Current Outpatient Medications  Medication Sig Dispense Refill  . acetaminophen (TYLENOL) 500 MG tablet Take 1,000 mg by mouth every 8 (eight) hours as needed.     Marland Kitchen albuterol (PROVENTIL HFA;VENTOLIN HFA) 108 (90 Base) MCG/ACT inhaler Inhale 1-2 puffs into the lungs every 6 (six) hours as needed for wheezing or shortness of breath. 1 Inhaler 0  . aspirin 81 MG tablet Take 81 mg by mouth daily.    Marland Kitchen atorvastatin (LIPITOR) 80 MG tablet Take 1 tablet (80 mg total) by mouth daily  at 6 PM. 90 tablet 3  . chlorhexidine (PERIDEX) 0.12 % solution     . clopidogrel (PLAVIX) 75 MG tablet Take 1 tablet (75 mg total) by mouth daily. 90 tablet 3  . fentaNYL (DURAGESIC) 100 MCG/HR Place 1 patch onto the skin every other day. Along with 6mg patch for total of 125 mcg every 2 days. 15 patch 0  . fentaNYL (DURAGESIC) 25 MCG/HR Place 1 patch onto the skin every other day. Use this along with fentanyl patch 100 mcg [total 125 mcg] every 2 days 15 patch 0  . gabapentin (NEURONTIN) 100 MG capsule START 1 CAPSULE DAILY, INCREASE BY 1 CAP EVERY 2 TO 3 DAYS AS TOLERATED UP TO 3 TIMES A DAY, OR MAY TAKE 3 AT ONCE [place] 90 capsule 5  . magnesium hydroxide (MILK OF MAGNESIA) 400 MG/5ML suspension Take 5 mLs by mouth daily as needed for constipation.    . metoprolol tartrate (LOPRESSOR) 25 MG tablet TAKE 1/2 TABLET BY MOUTH 2 TIMES DAILY. 90 tablet 1  . naloxone (NARCAN) nasal spray 4 mg/0.1 mL 1 spray into nostril  upon signs of opioid overdose. Call 911. May repeat once if no response within 2-3 minutes. 1 kit 0  . nitroGLYCERIN (NITROSTAT) 0.4 MG SL tablet Place 1 tablet (0.4 mg total) under the tongue every 5 (five) minutes as needed. 25 tablet 3  . oxyCODONE (ROXICODONE) 15 MG immediate release tablet Take 1 tablet (15 mg total) by mouth every 6 (six) hours as needed. for pain 90 tablet 0  . polyethylene glycol (MIRALAX / GLYCOLAX) packet Take 17 g by mouth daily as needed for moderate constipation.    . predniSONE (DELTASONE) 5 MG tablet TAKE 1 TABLET BY MOUTH DAILY WITH BREAKFAST. (Patient taking differently: Take 2.5 mg by mouth daily with breakfast. ) 30 tablet 1  . promethazine (PHENERGAN) 25 MG tablet TAKE 1/2 TO 1 TABLET (12.5-25 MG TOTAL) BY MOUTH EVERY 8 (EIGHT) HOURS AS NEEDED FOR NAUSEA OR VOMITING. 60 tablet 3  . traZODone (DESYREL) 50 MG tablet Take 1-2 tablets (50-100 mg total) by mouth at bedtime as needed for sleep. 60 tablet 2  . triamcinolone ointment (KENALOG) 0.5 %  Apply 1 application topically 2 (two) times daily. 30 g 0  . Wheat Dextrin (BENEFIBER) POWD Stir 2 tsp. TID into 4-8 oz of any non-carbonated beverage or soft food (hot or cold) 500 g PRN   No current facility-administered medications for this visit.    PHYSICAL EXAMINATION: ECOG PERFORMANCE STATUS: 1 - Symptomatic but completely ambulatory  BP 129/70 (BP Location: Left Arm, Patient Position: Sitting, Cuff Size: Normal)   Pulse 91   Temp (!) 96.3 F (35.7 C) (Tympanic)   Wt 163 lb (73.9 kg)   BMI 22.73 kg/m   Filed Weights   02/06/19 0917  Weight: 163 lb (73.9 kg)    Physical Exam  Constitutional: He is oriented to person, place, and time.   Frail-appearing Caucasian male patient.  He is in a walking with a cane.   He is alone.  HENT:  Head: Normocephalic and atraumatic.  Mouth/Throat: Oropharynx is clear and moist. No oropharyngeal exudate.  Poor dentition.  Eyes: Pupils are equal, round, and reactive to light.  Cardiovascular: Normal rate and regular rhythm.  Pulmonary/Chest: No respiratory distress. He has no wheezes.  Decreased breath sounds bilaterally at the bases.  No wheeze or crackles  Abdominal: Soft. Bowel sounds are normal. He exhibits no distension and no mass. There is no abdominal tenderness. There is no rebound and no guarding.  Musculoskeletal:        General: No tenderness or edema. Normal range of motion.     Cervical back: Normal range of motion and neck supple.  Neurological: He is alert and oriented to person, place, and time.  Skin: Skin is warm.  Macular rash on hands  Psychiatric: Affect normal.    LABORATORY DATA:  I have reviewed the data as listed    Component Value Date/Time   NA 135 02/05/2019 1322   NA 140 07/13/2013 1542   K 4.2 02/05/2019 1322   K 4.3 07/13/2013 1542   CL 103 02/05/2019 1322   CL 107 07/13/2013 1542   CO2 24 02/05/2019 1322   CO2 26 07/13/2013 1542   GLUCOSE 155 (H) 02/05/2019 1322   GLUCOSE 106 (H) 07/13/2013  1542   BUN 8 02/05/2019 1322   BUN 11 07/13/2013 1542   CREATININE 0.65 02/05/2019 1322   CREATININE 0.76 10/15/2017 1001   CALCIUM 9.1 02/05/2019 1322   CALCIUM 9.9 07/13/2013 1542   PROT 5.8 (L) 02/05/2019 1322  PROT 8.1 07/13/2013 1542   ALBUMIN 3.5 02/05/2019 1322   ALBUMIN 4.3 07/13/2013 1542   AST 17 02/05/2019 1322   AST 26 07/13/2013 1542   ALT 11 02/05/2019 1322   ALT 32 07/13/2013 1542   ALKPHOS 33 (L) 02/05/2019 1322   ALKPHOS 74 07/13/2013 1542   BILITOT 0.7 02/05/2019 1322   BILITOT 0.4 07/13/2013 1542   GFRNONAA >60 02/05/2019 1322   GFRNONAA 95 10/15/2017 1001   GFRAA >60 02/05/2019 1322   GFRAA 110 10/15/2017 1001    No results found for: SPEP, UPEP  Lab Results  Component Value Date   WBC 4.9 02/05/2019   NEUTROABS 3.2 02/05/2019   HGB 10.6 (L) 02/05/2019   HCT 33.4 (L) 02/05/2019   MCV 96.5 02/05/2019   PLT 182 02/05/2019      Chemistry      Component Value Date/Time   NA 135 02/05/2019 1322   NA 140 07/13/2013 1542   K 4.2 02/05/2019 1322   K 4.3 07/13/2013 1542   CL 103 02/05/2019 1322   CL 107 07/13/2013 1542   CO2 24 02/05/2019 1322   CO2 26 07/13/2013 1542   BUN 8 02/05/2019 1322   BUN 11 07/13/2013 1542   CREATININE 0.65 02/05/2019 1322   CREATININE 0.76 10/15/2017 1001      Component Value Date/Time   CALCIUM 9.1 02/05/2019 1322   CALCIUM 9.9 07/13/2013 1542   ALKPHOS 33 (L) 02/05/2019 1322   ALKPHOS 74 07/13/2013 1542   AST 17 02/05/2019 1322   AST 26 07/13/2013 1542   ALT 11 02/05/2019 1322   ALT 32 07/13/2013 1542   BILITOT 0.7 02/05/2019 1322   BILITOT 0.4 07/13/2013 1542     RADIOGRAPHIC STUDIES: I have personally reviewed the radiological images as listed and agreed with the findings in the report. No results found.   ASSESSMENT & PLAN:  Prostate cancer metastatic to bone ([place]) # Castrate resistant prostate cancer metastases to bone. Lupron every 3 months [10/04/2018]. Sep 2020; PSA- 190; rising. PET aux- 10/15-  Progressive bone/ Left suprahilar-mediastinal LN. Currently, on Taxotere weekly. Clinically, STABLE.  PSA- 86 improving.  # Proceed with taxotere; Labs today reviewed;  acceptable for treatment today; will plan 3 weeks-on and 1 week-off; [off in 2 weeks]  # Extreme fatigue/nausea vomiting-likely from chemotherapy-STABLE.   # Skin rash- Sec to Taxotere; resolved.   # Malignancy related pain- continue fentanyl patch hydrocodone;. Continue fentanyl patch to every 48 hours and continue hydrocodone.  STABLE.   # DISPOSITION:  # Taxotere today # Feb 11 th- MD; Reva Bores- cbc/cmp 1 day prior in [place];]Taxoetere-Dr. B    No orders of the defined types were placed in this encounter.  All questions were answered. The patient knows to call the clinic with any problems, questions or concerns.      [name], MD 02/06/2019 9:56 AM

## 2019-02-07 ENCOUNTER — Telehealth: Payer: Self-pay

## 2019-02-07 ENCOUNTER — Telehealth: Payer: Self-pay | Admitting: *Deleted

## 2019-02-07 NOTE — Telephone Encounter (Signed)
[place]: [id number] - PA Case ID: [id number] - Rx #: [id number]  Pa submitted - Fentanyl patch. - pending authorization

## 2019-02-07 NOTE — Telephone Encounter (Signed)
Telephone call to patient and wife's phones.  Patient nor wife answered phone and voice mails not set up to leave a message.  RN contacted Billey Chang NP at cancer center and he has no other numbers listed for patient.  RN will continue to try and reach patient.

## 2019-02-10 ENCOUNTER — Telehealth: Payer: Self-pay

## 2019-02-10 ENCOUNTER — Other Ambulatory Visit: Payer: Self-pay | Admitting: Family Medicine

## 2019-02-10 DIAGNOSIS — R232 Flushing: Secondary | ICD-10-CM

## 2019-02-10 NOTE — Telephone Encounter (Signed)
Fentanyl Status: PA Response - Approved

## 2019-02-10 NOTE — Telephone Encounter (Signed)
Telephone call to patient to schedule palliative care visit with patient. Patient/family in agreement with TELEHEALTH visit on 02-12-19 at 12:00PM.

## 2019-02-12 ENCOUNTER — Other Ambulatory Visit: Payer: 59

## 2019-02-12 ENCOUNTER — Other Ambulatory Visit: Payer: Medicare Other

## 2019-02-12 ENCOUNTER — Inpatient Hospital Stay: Payer: 59

## 2019-02-12 ENCOUNTER — Other Ambulatory Visit: Payer: Self-pay

## 2019-02-12 DIAGNOSIS — Z5111 Encounter for antineoplastic chemotherapy: Secondary | ICD-10-CM | POA: Diagnosis not present

## 2019-02-12 DIAGNOSIS — F329 Major depressive disorder, single episode, unspecified: Secondary | ICD-10-CM | POA: Diagnosis not present

## 2019-02-12 DIAGNOSIS — I10 Essential (primary) hypertension: Secondary | ICD-10-CM | POA: Diagnosis not present

## 2019-02-12 DIAGNOSIS — C7951 Secondary malignant neoplasm of bone: Secondary | ICD-10-CM | POA: Diagnosis not present

## 2019-02-12 DIAGNOSIS — C61 Malignant neoplasm of prostate: Secondary | ICD-10-CM | POA: Diagnosis not present

## 2019-02-12 DIAGNOSIS — F17210 Nicotine dependence, cigarettes, uncomplicated: Secondary | ICD-10-CM | POA: Diagnosis not present

## 2019-02-12 DIAGNOSIS — R21 Rash and other nonspecific skin eruption: Secondary | ICD-10-CM | POA: Diagnosis not present

## 2019-02-12 DIAGNOSIS — Z79899 Other long term (current) drug therapy: Secondary | ICD-10-CM | POA: Diagnosis not present

## 2019-02-12 DIAGNOSIS — E785 Hyperlipidemia, unspecified: Secondary | ICD-10-CM | POA: Diagnosis not present

## 2019-02-12 LAB — CBC WITH DIFFERENTIAL/PLATELET
Abs Immature Granulocytes: 0.01 10*3/uL (ref 0.00–0.07)
Basophils Absolute: 0 10*3/uL (ref 0.0–0.1)
Basophils Relative: 0 %
Eosinophils Absolute: 0 10*3/uL (ref 0.0–0.5)
Eosinophils Relative: 1 %
HCT: 34.5 % — ABNORMAL LOW (ref 39.0–52.0)
Hemoglobin: 11 g/dL — ABNORMAL LOW (ref 13.0–17.0)
Immature Granulocytes: 0 %
Lymphocytes Relative: 38 %
Lymphs Abs: 1.4 10*3/uL (ref 0.7–4.0)
MCH: 30.8 pg (ref 26.0–34.0)
MCHC: 31.9 g/dL (ref 30.0–36.0)
MCV: 96.6 fL (ref 80.0–100.0)
Monocytes Absolute: 0.4 10*3/uL (ref 0.1–1.0)
Monocytes Relative: 10 %
Neutro Abs: 1.8 10*3/uL (ref 1.7–7.7)
Neutrophils Relative %: 51 %
Platelets: 230 10*3/uL (ref 150–400)
RBC: 3.57 MIL/uL — ABNORMAL LOW (ref 4.22–5.81)
RDW: 16.3 % — ABNORMAL HIGH (ref 11.5–15.5)
WBC: 3.6 10*3/uL — ABNORMAL LOW (ref 4.0–10.5)
nRBC: 0 % (ref 0.0–0.2)

## 2019-02-12 LAB — COMPREHENSIVE METABOLIC PANEL
ALT: 9 U/L (ref 0–44)
AST: 18 U/L (ref 15–41)
Albumin: 3.6 g/dL (ref 3.5–5.0)
Alkaline Phosphatase: 31 U/L — ABNORMAL LOW (ref 38–126)
Anion gap: 7 (ref 5–15)
BUN: 9 mg/dL (ref 8–23)
CO2: 27 mmol/L (ref 22–32)
Calcium: 9.3 mg/dL (ref 8.9–10.3)
Chloride: 101 mmol/L (ref 98–111)
Creatinine, Ser: 0.66 mg/dL (ref 0.61–1.24)
GFR calc Af Amer: >60 mL/min (ref >60)
GFR calc non Af Amer: >60 mL/min (ref >60)
Glucose, Bld: 124 mg/dL — ABNORMAL HIGH (ref 70–99)
Potassium: 3.5 mmol/L (ref 3.5–5.1)
Sodium: 135 mmol/L (ref 135–145)
Total Bilirubin: 0.6 mg/dL (ref 0.3–1.2)
Total Protein: 5.9 g/dL — ABNORMAL LOW (ref 6.5–8.1)

## 2019-02-12 LAB — PSA: Prostatic Specific Antigen: 86.99 ng/mL — ABNORMAL HIGH (ref 0.00–4.00)

## 2019-02-13 ENCOUNTER — Ambulatory Visit: Payer: 59

## 2019-02-13 ENCOUNTER — Ambulatory Visit: Payer: 59 | Admitting: Internal Medicine

## 2019-02-13 ENCOUNTER — Other Ambulatory Visit: Payer: 59

## 2019-02-14 ENCOUNTER — Other Ambulatory Visit: Payer: Self-pay

## 2019-02-14 ENCOUNTER — Inpatient Hospital Stay: Payer: 59

## 2019-02-14 ENCOUNTER — Encounter: Payer: Self-pay | Admitting: Internal Medicine

## 2019-02-14 ENCOUNTER — Inpatient Hospital Stay (HOSPITAL_BASED_OUTPATIENT_CLINIC_OR_DEPARTMENT_OTHER): Payer: 59 | Admitting: Internal Medicine

## 2019-02-14 DIAGNOSIS — C7951 Secondary malignant neoplasm of bone: Secondary | ICD-10-CM | POA: Diagnosis not present

## 2019-02-14 DIAGNOSIS — Z79899 Other long term (current) drug therapy: Secondary | ICD-10-CM | POA: Diagnosis not present

## 2019-02-14 DIAGNOSIS — R21 Rash and other nonspecific skin eruption: Secondary | ICD-10-CM | POA: Diagnosis not present

## 2019-02-14 DIAGNOSIS — C61 Malignant neoplasm of prostate: Secondary | ICD-10-CM

## 2019-02-14 DIAGNOSIS — Z7189 Other specified counseling: Secondary | ICD-10-CM

## 2019-02-14 DIAGNOSIS — F17210 Nicotine dependence, cigarettes, uncomplicated: Secondary | ICD-10-CM | POA: Diagnosis not present

## 2019-02-14 DIAGNOSIS — I10 Essential (primary) hypertension: Secondary | ICD-10-CM | POA: Diagnosis not present

## 2019-02-14 DIAGNOSIS — F329 Major depressive disorder, single episode, unspecified: Secondary | ICD-10-CM | POA: Diagnosis not present

## 2019-02-14 DIAGNOSIS — I25118 Atherosclerotic heart disease of native coronary artery with other forms of angina pectoris: Secondary | ICD-10-CM

## 2019-02-14 DIAGNOSIS — E785 Hyperlipidemia, unspecified: Secondary | ICD-10-CM | POA: Diagnosis not present

## 2019-02-14 DIAGNOSIS — Z5111 Encounter for antineoplastic chemotherapy: Secondary | ICD-10-CM | POA: Diagnosis not present

## 2019-02-14 MED ORDER — SODIUM CHLORIDE 0.9 % IV SOLN
Freq: Once | INTRAVENOUS | Status: AC
  Filled 2019-02-14: qty 250

## 2019-02-14 MED ORDER — SODIUM CHLORIDE 0.9 % IV SOLN
36.0000 mg/m2 | Freq: Once | INTRAVENOUS | Status: AC
  Administered 2019-02-14: 70 mg via INTRAVENOUS
  Filled 2019-02-14: qty 7

## 2019-02-14 MED ORDER — DEXAMETHASONE SODIUM PHOSPHATE 10 MG/ML IJ SOLN
10.0000 mg | Freq: Once | INTRAMUSCULAR | Status: AC
  Administered 2019-02-14: 10 mg via INTRAVENOUS
  Filled 2019-02-14: qty 1

## 2019-02-14 MED ORDER — OXYCODONE HCL 15 MG PO TABS: 15.0000 mg | ORAL_TABLET | Freq: Three times a day (TID) | ORAL | 0 refills | Status: DC | PRN

## 2019-02-14 MED ORDER — ONDANSETRON HCL 8 MG PO TABS: ORAL_TABLET | ORAL | 1 refills | Status: DC

## 2019-02-14 NOTE — Progress Notes (Signed)
[facility] OFFICE PROGRESS NOTE  Patient Care Team: Robert Hauser, DO as PCP - General (Family Medicine) Robert Situ [name], MD as PCP - Cardiology (Cardiology) Robert Merritts, MD as Consulting Physician (Cardiology) Robert Sickle, [name]  Cancer Staging No matching staging information was found for the patient.   Oncology History Overview Note  # 2011- PROSTATE CANCER [Gleason 3+4]; s/p Prostatectomy [ also involved bladder neck/ECP; Dr.Polaseck]; July 2014- Biochemical recurrence [PSA 14]- started on Zoladex [Dr.Pandit]; lost to follow up.  # JAN 2017- STAGE IV METASTATIC PROSTATE Cancer to Bone- Feb 13th, 2017-  Lupron q 40m[~end of feb]; PSA: 1021; Declined Chemo; April 2017 [xofigo x6; Dr.Crystal]; AUG 2017- Zytiga + Prednisone BID. Bone scan-Jan 2018- improved skeletal metastases.   # MAY 1st 2019- START X-tandi [stopped zytiga/PSA- 8.8/rising]  #August 23, 2018-stop Xtandi [intolerance-fatigue mental fogginess]  #Mid September 2020-apalutamide; stopped mid-October poor tolerance/progression  # 11/01/2018- -Taxotere weekly [consent]  # Mets to bone- start X-geva q 55M [May 30th]; hold dental issues/infections  # Smoker/ chronic pain/pain clinic   #[April 2018; [place]]- s/p stenting; on Plavix; --------------------------------------------------------------  DIAGNOSIS: '[ ]'  Castrate resistant prostate cancer  STAGE:   4    ;GOALS: Palliative  CURRENT/MOST RECENT THERAPY-Late Oct 2020- Taxotere weekly [consent]    [place]  12/[place]/2014 Initial Diagnosis   [place]   11/01/2018 -  Chemotherapy   The patient had DOCEtaxel (TAXOTERE) 70 mg in sodium chloride 0.9 % 150 mL chemo infusion, 36 mg/m2 = 70 mg, Intravenous,  Once, 10 of 18 cycles Administration: 70 mg (11/08/2018), 70 mg (11/22/2018), 70 mg (12/13/2018), 70 mg (12/06/2018),  70 mg (12/31/2018), 70 mg (01/07/2019), 70 mg (01/29/2019), 70 mg (12/20/2018), 70 mg (02/06/2019)  for chemotherapy treatment.     INTERVAL HISTORY:  Robert [id number].  male pleasant patient above history of metastatic castrate resistant prostate cancer currently -on Taxotere weekly is here for follow-up.  Patient complains of worsening nausea; he continues to be on Compazine.   Patient's pain the back and joints - currently stable on fentanyl patch; oxycodone.  Noted to have mild rash on his bilateral hands.   Review of Systems  Constitutional: Positive for malaise/fatigue and weight loss. Negative for chills, diaphoresis and fever.  HENT: Negative for nosebleeds and sore throat.   Eyes: Negative for double vision.  Respiratory: Negative for cough, hemoptysis, sputum production and wheezing.   Cardiovascular: Negative for chest pain, palpitations, orthopnea and leg swelling.  Gastrointestinal: Positive for nausea. Negative for abdominal pain, blood in stool, diarrhea, heartburn, melena and vomiting.  Genitourinary: Negative for dysuria, frequency and urgency.  Musculoskeletal: Positive for back pain and joint pain.  Skin: Positive for rash. Negative for itching.  Neurological: Negative for dizziness, tingling, focal weakness, weakness and headaches.  Endo/Heme/Allergies: Does not bruise/bleed easily.  Psychiatric/Behavioral: Negative for depression. The patient is not nervous/anxious and does not have insomnia.       PAST MEDICAL HISTORY :  Past Medical History:  Diagnosis Date  . Back pain 10/09/2012  . [place]   . CAD (coronary artery disease)    a. 08/2015 PCI: LM nl, LAD 20d, LCX nl, RCA 255mRPAV 95 (3.0x18 Xience Alpine DES);  b. [facility] [facility] RPL 95 (2.75x8 Promus Premier DES).  . Cancer associated pain   . Depression   . History of echocardiogram    a.  08/2016 Echo: EF 50-55%.  Marland Kitchen History of kidney stones   . Hyperlipidemia   . Hypertension   . Joint  pain   . Prostate cancer [facility])    a.  s/p prostatectomy ([facility]);  b. bone mets noted 04/2016.  . Right arm pain 01/10/2016  . Right foot pain 01/10/2016  . Right leg pain 01/10/2016  . Tobacco abuse     PAST SURGICAL HISTORY :   Past Surgical History:  Procedure Laterality Date  . CARDIAC CATHETERIZATION     [facility]  . CARDIAC CATHETERIZATION N/A 08/16/2015   Procedure: Left Heart Cath and Coronary Angiography;  Surgeon: Yolonda Kida, MD;  Location: [place] CV LAB;  Service: Cardiovascular;  Laterality: N/A;  . CARDIAC CATHETERIZATION N/A 08/16/2015   Procedure: Coronary Stent Intervention;  Surgeon: Yolonda Kida, MD;  Location: [place] CV LAB;  Service: Cardiovascular;  Laterality: N/A;  . CERVIX SURGERY    . PROSTATECTOMY    . SPINE SURGERY    . TONSILLECTOMY    . WRIST SURGERY      FAMILY HISTORY :   Family History  Problem Relation Age of Onset  . Heart attack Mother   . Hypertension Mother   . Heart attack Father     SOCIAL HISTORY:   Social History   Tobacco Use  . Smoking status: Current Every Day Smoker    Packs/day: 0.50    Years: 40.00    Pack years: 20.00    Types: Cigarettes  . Smokeless tobacco: Current User    Types: Chew  . Tobacco comment: 1 pack every couple days   Substance Use Topics  . Alcohol use: Yes    Comment: occasionally  . Drug use: No    ALLERGIES:  is allergic to ditropan [oxybutynin].  MEDICATIONS:  Current Outpatient Medications  Medication Sig Dispense Refill  . acetaminophen (TYLENOL) 500 MG tablet Take 1,000 mg by mouth every 8 (eight) hours as needed.     Marland Kitchen albuterol (PROVENTIL HFA;VENTOLIN HFA) 108 (90 Base) MCG/ACT inhaler Inhale 1-2 puffs into the lungs every 6 (six) hours as needed for wheezing or shortness of breath. 1 Inhaler 0  . aspirin 81 MG tablet Take 81 mg by mouth daily.    Marland Kitchen atorvastatin (LIPITOR) 80 MG tablet Take 1 tablet (80 mg total) by mouth daily at 6 PM. 90 tablet 3  . chlorhexidine  (PERIDEX) 0.12 % solution     . clopidogrel (PLAVIX) 75 MG tablet Take 1 tablet (75 mg total) by mouth daily. 90 tablet 3  . fentaNYL (DURAGESIC) 100 MCG/HR Place 1 patch onto the skin every other day. Along with 10mg patch for total of 125 mcg every 2 days. 15 patch 0  . fentaNYL (DURAGESIC) 25 MCG/HR Place 1 patch onto the skin every other day. Use this along with fentanyl patch 100 mcg [total 125 mcg] every 2 days 15 patch 0  . gabapentin (NEURONTIN) 100 MG capsule Take 1 capsule (100 mg total) by mouth 3 (three) times daily. 90 capsule 5  . magnesium hydroxide (MILK OF MAGNESIA) 400 MG/5ML suspension Take 5 mLs by mouth daily as needed for constipation.    . metoprolol tartrate (LOPRESSOR) 25 MG tablet TAKE 1/2 TABLET BY MOUTH 2 TIMES DAILY. 90 tablet 1  . oxyCODONE (ROXICODONE) 15 MG immediate release tablet Take 1 tablet (15 mg total) by mouth every 8 (eight) hours as needed. for pain 90 tablet 0  . polyethylene glycol (MIRALAX / GLYCOLAX) packet Take  17 g by mouth daily as needed for moderate constipation.    . predniSONE (DELTASONE) 5 MG tablet TAKE 1 TABLET BY MOUTH DAILY WITH BREAKFAST. (Patient taking differently: Take 2.5 mg by mouth daily with breakfast. ) 30 tablet 1  . promethazine (PHENERGAN) 25 MG tablet TAKE 1/2 TO 1 TABLET (12.5-25 MG TOTAL) BY MOUTH EVERY 8 (EIGHT) HOURS AS NEEDED FOR NAUSEA OR VOMITING. 60 tablet 3  . traZODone (DESYREL) 50 MG tablet Take 1-2 tablets (50-100 mg total) by mouth at bedtime as needed for sleep. 60 tablet 2  . triamcinolone ointment (KENALOG) 0.5 % Apply 1 application topically 2 (two) times daily. 30 g 0  . Wheat Dextrin (BENEFIBER) POWD Stir 2 tsp. TID into 4-8 oz of any non-carbonated beverage or soft food (hot or cold) 500 g PRN  . naloxone (NARCAN) nasal spray 4 mg/0.1 mL 1 spray into nostril upon signs of opioid overdose. Call 911. May repeat once if no response within 2-3 minutes. (Patient not taking: Reported on 02/14/2019) 1 kit 0  .  nitroGLYCERIN (NITROSTAT) 0.4 MG SL tablet Place 1 tablet (0.4 mg total) under the tongue every 5 (five) minutes as needed. (Patient not taking: Reported on 02/14/2019) 25 tablet 3  . ondansetron (ZOFRAN) 8 MG tablet One pill every 8 hours as needed for nausea/vomitting. 40 tablet 1   No current facility-administered medications for this visit.    PHYSICAL EXAMINATION: ECOG PERFORMANCE STATUS: 1 - Symptomatic but completely ambulatory  BP 131/84   Pulse 92   Temp 97.7 F (36.5 C) (Tympanic)   Resp 20   There were no vitals filed for this visit.  Physical Exam  Constitutional: He is oriented to person, place, and time.   Frail-appearing Caucasian male patient.  He is in a walking with a cane.   He is alone.  HENT:  Head: Normocephalic and atraumatic.  Mouth/Throat: Oropharynx is clear and moist. No oropharyngeal exudate.  Poor dentition.  Eyes: Pupils are equal, round, and reactive to light.  Cardiovascular: Normal rate and regular rhythm.  Pulmonary/Chest: No respiratory distress. He has no wheezes.  Decreased breath sounds bilaterally at the bases.  No wheeze or crackles  Abdominal: Soft. Bowel sounds are normal. He exhibits no distension and no mass. There is no abdominal tenderness. There is no rebound and no guarding.  Musculoskeletal:        General: No tenderness or edema. Normal range of motion.     Cervical back: Normal range of motion and neck supple.  Neurological: He is alert and oriented to person, place, and time.  Skin: Skin is warm.  Macular rash on hands  Psychiatric: Affect normal.    LABORATORY DATA:  I have reviewed the data as listed    Component Value Date/Time   NA 135 02/12/2019 1348   NA 140 07/13/2013 1542   K 3.5 02/12/2019 1348   K 4.3 07/13/2013 1542   CL 101 02/12/2019 1348   CL 107 07/13/2013 1542   CO2 27 02/12/2019 1348   CO2 26 07/13/2013 1542   GLUCOSE 124 (H) 02/12/2019 1348   GLUCOSE 106 (H) 07/13/2013 1542   BUN 9 02/12/2019 1348    BUN 11 07/13/2013 1542   CREATININE 0.66 02/12/2019 1348   CREATININE 0.76 10/15/2017 1001   CALCIUM 9.3 02/12/2019 1348   CALCIUM 9.9 07/13/2013 1542   PROT 5.9 (L) 02/12/2019 1348   PROT 8.1 07/13/2013 1542   ALBUMIN 3.6 02/12/2019 1348   ALBUMIN 4.3 07/13/2013 1542  AST 18 02/12/2019 1348   AST 26 07/13/2013 1542   ALT 9 02/12/2019 1348   ALT 32 07/13/2013 1542   ALKPHOS 31 (L) 02/12/2019 1348   ALKPHOS 74 07/13/2013 1542   BILITOT 0.6 02/12/2019 1348   BILITOT 0.4 07/13/2013 1542   GFRNONAA >60 02/12/2019 1348   GFRNONAA 95 10/15/2017 1001   GFRAA >60 02/12/2019 1348   GFRAA 110 10/15/2017 1001    No results found for: SPEP, UPEP  Lab Results  Component Value Date   WBC 3.6 (L) 02/12/2019   NEUTROABS 1.8 02/12/2019   HGB 11.0 (L) 02/12/2019   HCT 34.5 (L) 02/12/2019   MCV 96.6 02/12/2019   PLT 230 02/12/2019      Chemistry      Component Value Date/Time   NA 135 02/12/2019 1348   NA 140 07/13/2013 1542   K 3.5 02/12/2019 1348   K 4.3 07/13/2013 1542   CL 101 02/12/2019 1348   CL 107 07/13/2013 1542   CO2 27 02/12/2019 1348   CO2 26 07/13/2013 1542   BUN 9 02/12/2019 1348   BUN 11 07/13/2013 1542   CREATININE 0.66 02/12/2019 1348   CREATININE 0.76 10/15/2017 1001      Component Value Date/Time   CALCIUM 9.3 02/12/2019 1348   CALCIUM 9.9 07/13/2013 1542   ALKPHOS 31 (L) 02/12/2019 1348   ALKPHOS 74 07/13/2013 1542   AST 18 02/12/2019 1348   AST 26 07/13/2013 1542   ALT 9 02/12/2019 1348   ALT 32 07/13/2013 1542   BILITOT 0.6 02/12/2019 1348   BILITOT 0.4 07/13/2013 1542     RADIOGRAPHIC STUDIES: I have personally reviewed the radiological images as listed and agreed with the findings in the report. No results found.   ASSESSMENT & PLAN:  Prostate cancer metastatic to bone ([place]) # Castrate resistant prostate cancer metastases to bone. Lupron every 3 months [due 04/03/2019]. Sep 2020; PSA- 190; rising. PET aux- 10/15- Progressive bone/ Left  suprahilar-mediastinal LN. Currently, on Taxotere weekly. Clinically, STABLE.  PSA- 86-STABLE.   # Proceed with taxotere # 12 [w-On&;1w-OFF] Labs today reviewed;  acceptable for treatment today; again reviewed the natural history of prostate cancer/continue monitor PSA for now  # Extreme fatigue/nausea -without vomiting likely from chemotherapy-slightly worse recommend adding zofran prn. New script sent.   #Rash on hands-likely secondary chemotherapy; recommend steroid cream.  # Malignancy related pain- continue fentanyl patch hydrocodone;. Continue fentanyl patch to every 48 hours and continue oxycodone prn; stable.   # DISPOSITION:  # Taxotere today # week-off # Feb 26th - MD; Reva Bores- cbc/cmp/PSA 1 day prior in [place];]Taxoetere-Dr. B    No orders of the defined types were placed in this encounter.  All questions were answered. The patient knows to call the clinic with any problems, questions or concerns.      Robert Sickle, MD 02/14/2019 11:57 AM

## 2019-02-14 NOTE — Assessment & Plan Note (Addendum)
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 Lupron every 3 months [due 04/03/2019]. Sep 2020; PSA- 190; rising. PET aux- 10/15- Progressive bone/ Left suprahilar-mediastinal LN. Currently, on Taxotere weekly. Clinically, STABLE.  PSA- 86-STABLE.   # Proceed with taxotere # 12 [w-On&;1w-OFF] Labs today reviewed;  acceptable for treatment today; again reviewed the natural history of prostate cancer/continue monitor PSA for now  # Extreme fatigue/nausea -without vomiting likely from chemotherapy-slightly worse recommend adding zofran prn. New script sent.   #Rash on hands-likely secondary chemotherapy; recommend steroid cream.  # Malignancy related pain- continue fentanyl patch hydrocodone;. Continue fentanyl patch to every 48 hours and continue oxycodone prn; stable.   # DISPOSITION:  # Taxotere today # week-off # Feb 26th - MD; Reva Bores- cbc/cmp/PSA 1 day prior in [place];]Taxoetere-Dr. B

## 2019-02-27 ENCOUNTER — Inpatient Hospital Stay: Payer: 59

## 2019-02-27 ENCOUNTER — Other Ambulatory Visit: Payer: Self-pay

## 2019-02-27 DIAGNOSIS — Z79899 Other long term (current) drug therapy: Secondary | ICD-10-CM | POA: Diagnosis not present

## 2019-02-27 DIAGNOSIS — I10 Essential (primary) hypertension: Secondary | ICD-10-CM | POA: Diagnosis not present

## 2019-02-27 DIAGNOSIS — R21 Rash and other nonspecific skin eruption: Secondary | ICD-10-CM | POA: Diagnosis not present

## 2019-02-27 DIAGNOSIS — E785 Hyperlipidemia, unspecified: Secondary | ICD-10-CM | POA: Diagnosis not present

## 2019-02-27 DIAGNOSIS — Z5111 Encounter for antineoplastic chemotherapy: Secondary | ICD-10-CM | POA: Diagnosis not present

## 2019-02-27 DIAGNOSIS — F17210 Nicotine dependence, cigarettes, uncomplicated: Secondary | ICD-10-CM | POA: Diagnosis not present

## 2019-02-27 DIAGNOSIS — C61 Malignant neoplasm of prostate: Secondary | ICD-10-CM | POA: Diagnosis not present

## 2019-02-27 DIAGNOSIS — F329 Major depressive disorder, single episode, unspecified: Secondary | ICD-10-CM | POA: Diagnosis not present

## 2019-02-27 DIAGNOSIS — C7951 Secondary malignant neoplasm of bone: Secondary | ICD-10-CM | POA: Diagnosis not present

## 2019-02-27 LAB — CBC WITH DIFFERENTIAL/PLATELET
Abs Immature Granulocytes: 0.03 10*3/uL (ref 0.00–0.07)
Basophils Absolute: 0 10*3/uL (ref 0.0–0.1)
Basophils Relative: 0 %
Eosinophils Absolute: 0 10*3/uL (ref 0.0–0.5)
Eosinophils Relative: 0 %
HCT: 31.4 % — ABNORMAL LOW (ref 39.0–52.0)
Hemoglobin: 10.2 g/dL — ABNORMAL LOW (ref 13.0–17.0)
Immature Granulocytes: 1 %
Lymphocytes Relative: 27 %
Lymphs Abs: 1.5 10*3/uL (ref 0.7–4.0)
MCH: 31.1 pg (ref 26.0–34.0)
MCHC: 32.5 g/dL (ref 30.0–36.0)
MCV: 95.7 fL (ref 80.0–100.0)
Monocytes Absolute: 0.9 10*3/uL (ref 0.1–1.0)
Monocytes Relative: 16 %
Neutro Abs: 3.1 10*3/uL (ref 1.7–7.7)
Neutrophils Relative %: 56 %
Platelets: 200 10*3/uL (ref 150–400)
RBC: 3.28 MIL/uL — ABNORMAL LOW (ref 4.22–5.81)
RDW: 16.7 % — ABNORMAL HIGH (ref 11.5–15.5)
WBC: 5.6 10*3/uL (ref 4.0–10.5)
nRBC: 0 % (ref 0.0–0.2)

## 2019-02-27 LAB — COMPREHENSIVE METABOLIC PANEL
ALT: 8 U/L (ref 0–44)
AST: 17 U/L (ref 15–41)
Albumin: 3.2 g/dL — ABNORMAL LOW (ref 3.5–5.0)
Alkaline Phosphatase: 37 U/L — ABNORMAL LOW (ref 38–126)
Anion gap: 11 (ref 5–15)
BUN: 7 mg/dL — ABNORMAL LOW (ref 8–23)
CO2: 22 mmol/L (ref 22–32)
Calcium: 8.7 mg/dL — ABNORMAL LOW (ref 8.9–10.3)
Chloride: 103 mmol/L (ref 98–111)
Creatinine, Ser: 0.7 mg/dL (ref 0.61–1.24)
GFR calc Af Amer: >60 mL/min (ref >60)
GFR calc non Af Amer: >60 mL/min (ref >60)
Glucose, Bld: 98 mg/dL (ref 70–99)
Potassium: 3.5 mmol/L (ref 3.5–5.1)
Sodium: 136 mmol/L (ref 135–145)
Total Bilirubin: 0.4 mg/dL (ref 0.3–1.2)
Total Protein: 5.5 g/dL — ABNORMAL LOW (ref 6.5–8.1)

## 2019-02-27 LAB — PSA: Prostatic Specific Antigen: 79.21 ng/mL — ABNORMAL HIGH (ref 0.00–4.00)

## 2019-02-28 ENCOUNTER — Inpatient Hospital Stay (HOSPITAL_BASED_OUTPATIENT_CLINIC_OR_DEPARTMENT_OTHER): Payer: 59 | Admitting: Internal Medicine

## 2019-02-28 ENCOUNTER — Inpatient Hospital Stay: Payer: 59

## 2019-02-28 ENCOUNTER — Other Ambulatory Visit: Payer: Self-pay

## 2019-02-28 ENCOUNTER — Telehealth: Payer: Self-pay

## 2019-02-28 DIAGNOSIS — Z7189 Other specified counseling: Secondary | ICD-10-CM

## 2019-02-28 DIAGNOSIS — F17210 Nicotine dependence, cigarettes, uncomplicated: Secondary | ICD-10-CM | POA: Diagnosis not present

## 2019-02-28 DIAGNOSIS — Z5111 Encounter for antineoplastic chemotherapy: Secondary | ICD-10-CM | POA: Diagnosis not present

## 2019-02-28 DIAGNOSIS — C61 Malignant neoplasm of prostate: Secondary | ICD-10-CM | POA: Diagnosis not present

## 2019-02-28 DIAGNOSIS — R21 Rash and other nonspecific skin eruption: Secondary | ICD-10-CM | POA: Diagnosis not present

## 2019-02-28 DIAGNOSIS — F329 Major depressive disorder, single episode, unspecified: Secondary | ICD-10-CM | POA: Diagnosis not present

## 2019-02-28 DIAGNOSIS — E785 Hyperlipidemia, unspecified: Secondary | ICD-10-CM | POA: Diagnosis not present

## 2019-02-28 DIAGNOSIS — I25118 Atherosclerotic heart disease of native coronary artery with other forms of angina pectoris: Secondary | ICD-10-CM

## 2019-02-28 DIAGNOSIS — C7951 Secondary malignant neoplasm of bone: Secondary | ICD-10-CM | POA: Diagnosis not present

## 2019-02-28 DIAGNOSIS — Z79899 Other long term (current) drug therapy: Secondary | ICD-10-CM | POA: Diagnosis not present

## 2019-02-28 DIAGNOSIS — I10 Essential (primary) hypertension: Secondary | ICD-10-CM | POA: Diagnosis not present

## 2019-02-28 MED ORDER — DEXAMETHASONE SODIUM PHOSPHATE 10 MG/ML IJ SOLN
10.0000 mg | Freq: Once | INTRAMUSCULAR | Status: AC
  Administered 2019-02-28: 10 mg via INTRAVENOUS
  Filled 2019-02-28: qty 1

## 2019-02-28 MED ORDER — SODIUM CHLORIDE 0.9 % IV SOLN
Freq: Once | INTRAVENOUS | Status: AC
  Filled 2019-02-28: qty 250

## 2019-02-28 MED ORDER — SODIUM CHLORIDE 0.9 % IV SOLN
36.0000 mg/m2 | Freq: Once | INTRAVENOUS | Status: AC
  Administered 2019-02-28: 70 mg via INTRAVENOUS
  Filled 2019-02-28: qty 7

## 2019-02-28 NOTE — Telephone Encounter (Signed)
SW attempted to contact patient to schedule palliative care team visit. No answer, unable to leave a VM.

## 2019-02-28 NOTE — Progress Notes (Signed)
[place] OFFICE PROGRESS NOTE  Patient Care Team: Olin Hauser, [name], MD as PCP - Cardiology (Cardiology) Minna Merritts, [name], [name]  Cancer Staging No matching staging information was found for the patient.   Oncology History Overview Note  # 2011- PROSTATE CANCER [Gleason 3+4]; s/p Prostatectomy [ also involved bladder neck/ECP; Dr.Polaseck]; July 2014- Biochemical recurrence [PSA 14]- started on Zoladex [Dr.Pandit]; lost to follow up.  # JAN 2017- STAGE IV METASTATIC PROSTATE Cancer to Bone- Feb 13th, 2017-  Lupron q 49m[~end of feb]; PSA: 1021; Declined Chemo; April 2017 [xofigo x6; Dr.Crystal]; AUG 2017- Zytiga + Prednisone BID. Bone scan-Jan 2018- improved skeletal metastases.   # MAY 1st 2019- START X-tandi [stopped zytiga/PSA- 8.8/rising]  #August 23, 2018-stop Xtandi [intolerance-fatigue mental fogginess]  #Mid September 2020-apalutamide; stopped mid-October poor tolerance/progression  # 11/01/2018- -Taxotere weekly [consent]  # Mets to bone- start X-geva q 4M [May 30th]; hold dental issues/infections  # Smoker/ chronic pain/pain clinic   #[April 2018; [place]]- s/p stenting; on Plavix; --------------------------------------------------------------  DIAGNOSIS: _0  Castrate resistant prostate cancer  STAGE:   4    ;GOALS: Palliative  CURRENT/MOST RECENT THERAPY-Late Oct 2020- Taxotere weekly [consent]    Prostate cancer metastatic to bone ([facility]  12/08/2014 Initial Diagnosis   Prostate cancer metastatic to bone ([facility]   11/01/2018 -  Chemotherapy   The patient had DOCEtaxel (TAXOTERE) 70 mg in sodium chloride 0.9 % 150 mL chemo infusion, 36 mg/m2 = 70 mg, Intravenous,  Once, 11 of 18 cycles Administration: 70 mg (11/08/2018), 70 mg (11/22/2018), 70 mg (12/13/2018), 70 mg (12/06/2018),  70 mg (12/31/2018), 70 mg (01/07/2019), 70 mg (01/29/2019), 70 mg (12/20/2018), 70 mg (02/06/2019), 70 mg (02/14/2019)  for chemotherapy treatment.     INTERVAL HISTORY:  Robert [id number].  male pleasant patient above history of metastatic castrate resistant prostate cancer currently -on Taxotere weekly is here for follow-up.  Patient states his nausea is improved.  Intermittent nausea stable on Zofran.  Continues to chronic back pain joint pains.  Pain is stable on fentanyl patch/oxycodone.  Rash bilateral hands improved.  Review of Systems  Constitutional: Positive for malaise/fatigue and weight loss. Negative for chills, diaphoresis and fever.  HENT: Negative for nosebleeds and sore throat.   Eyes: Negative for double vision.  Respiratory: Negative for cough, hemoptysis, sputum production and wheezing.   Cardiovascular: Negative for chest pain, palpitations, orthopnea and leg swelling.  Gastrointestinal: Positive for nausea. Negative for abdominal pain, blood in stool, diarrhea, heartburn, melena and vomiting.  Genitourinary: Negative for dysuria, frequency and urgency.  Musculoskeletal: Positive for back pain and joint pain.  Skin: Positive for rash. Negative for itching.  Neurological: Negative for dizziness, tingling, focal weakness, weakness and headaches.  Endo/Heme/Allergies: Does not bruise/bleed easily.  Psychiatric/Behavioral: Negative for depression. The patient is not nervous/anxious and does not have insomnia.       PAST MEDICAL HISTORY :  Past Medical History:  Diagnosis Date  . Back pain 10/09/2012  . [place]   . CAD (coronary artery disease)    a. 08/2015 PCI: LM nl, LAD 20d, LCX nl, RCA 237mRPAV 95 (3.0x18 Xience Alpine DES);  b[facility] (2.75x8 Promus Premier DES).  . Cancer associated pain   . Depression   . History of echocardiogram    a. 08/2016 Echo:  EF 50-55%.  Marland Kitchen History of kidney stones   . Hyperlipidemia   . Hypertension    . Joint pain   . Prostate cancer [facility])    a.  s/p prostatectomy ([facility]);  b. bone mets noted 04/2016.  . Right arm pain 01/10/2016  . Right foot pain 01/10/2016  . Right leg pain 01/10/2016  . Tobacco abuse     PAST SURGICAL HISTORY :   Past Surgical History:  Procedure Laterality Date  . CARDIAC CATHETERIZATION     [facility]  . CARDIAC CATHETERIZATION N/A 08/16/2015   Procedure: Left Heart Cath and Coronary Angiography;  Surgeon: Yolonda Kida, MD;  Location: [place] CV LAB;  Service: Cardiovascular;  Laterality: N/A;  . CARDIAC CATHETERIZATION N/A 08/16/2015   Procedure: Coronary Stent Intervention;  Surgeon: Yolonda Kida, MD;  Location: [place] CV LAB;  Service: Cardiovascular;  Laterality: N/A;  . CERVIX SURGERY    . PROSTATECTOMY    . SPINE SURGERY    . TONSILLECTOMY    . WRIST SURGERY      FAMILY HISTORY :   Family History  Problem Relation Age of Onset  . Heart attack Mother   . Hypertension Mother   . Heart attack Father     SOCIAL HISTORY:   Social History   Tobacco Use  . Smoking status: Current Every Day Smoker    Packs/day: 0.50    Years: 40.00    Pack years: 20.00    Types: Cigarettes  . Smokeless tobacco: Current User    Types: Chew  . Tobacco comment: 1 pack every couple days   Substance Use Topics  . Alcohol use: Yes    Comment: occasionally  . Drug use: No    ALLERGIES:  is allergic to ditropan [oxybutynin].  MEDICATIONS:  Current Outpatient Medications  Medication Sig Dispense Refill  . acetaminophen (TYLENOL) 500 MG tablet Take 1,000 mg by mouth every 8 (eight) hours as needed.     Marland Kitchen albuterol (PROVENTIL HFA;VENTOLIN HFA) 108 (90 Base) MCG/ACT inhaler Inhale 1-2 puffs into the lungs every 6 (six) hours as needed for wheezing or shortness of breath. 1 Inhaler 0  . aspirin 81 MG tablet Take 81 mg by mouth daily.    Marland Kitchen atorvastatin (LIPITOR) 80 MG tablet Take 1 tablet (80 mg total) by mouth daily at 6 PM. 90 tablet 3  .  chlorhexidine (PERIDEX) 0.12 % solution     . clopidogrel (PLAVIX) 75 MG tablet Take 1 tablet (75 mg total) by mouth daily. 90 tablet 3  . fentaNYL (DURAGESIC) 100 MCG/HR Place 1 patch onto the skin every other day. Along with 52mg patch for total of 125 mcg every 2 days. 15 patch 0  . fentaNYL (DURAGESIC) 25 MCG/HR Place 1 patch onto the skin every other day. Use this along with fentanyl patch 100 mcg [total 125 mcg] every 2 days 15 patch 0  . gabapentin (NEURONTIN) 100 MG capsule Take 1 capsule (100 mg total) by mouth 3 (three) times daily. 90 capsule 5  . magnesium hydroxide (MILK OF MAGNESIA) 400 MG/5ML suspension Take 5 mLs by mouth daily as needed for constipation.    . metoprolol tartrate (LOPRESSOR) 25 MG tablet TAKE 1/2 TABLET BY MOUTH 2 TIMES DAILY. 90 tablet 1  . naloxone (NARCAN) nasal spray 4 mg/0.1 mL 1 spray into nostril upon signs of opioid overdose. Call 911. May repeat once if no response within 2-3 minutes. (Patient not taking: Reported on 02/14/2019) 1 kit 0  .  nitroGLYCERIN (NITROSTAT) 0.4 MG SL tablet Place 1 tablet (0.4 mg total) under the tongue every 5 (five) minutes as needed. (Patient not taking: Reported on 02/14/2019) 25 tablet 3  . ondansetron (ZOFRAN) 8 MG tablet One pill every 8 hours as needed for nausea/vomitting. 40 tablet 1  . oxyCODONE (ROXICODONE) 15 MG immediate release tablet Take 1 tablet (15 mg total) by mouth every 8 (eight) hours as needed. for pain 90 tablet 0  . polyethylene glycol (MIRALAX / GLYCOLAX) packet Take 17 g by mouth daily as needed for moderate constipation.    . predniSONE (DELTASONE) 5 MG tablet TAKE 1 TABLET BY MOUTH DAILY WITH BREAKFAST. (Patient taking differently: Take 2.5 mg by mouth daily with breakfast. ) 30 tablet 1  . promethazine (PHENERGAN) 25 MG tablet TAKE 1/2 TO 1 TABLET (12.5-25 MG TOTAL) BY MOUTH EVERY 8 (EIGHT) HOURS AS NEEDED FOR NAUSEA OR VOMITING. 60 tablet 3  . traZODone (DESYREL) 50 MG tablet Take 1-2 tablets (50-100 mg  total) by mouth at bedtime as needed for sleep. 60 tablet 2  . triamcinolone ointment (KENALOG) 0.5 % Apply 1 application topically 2 (two) times daily. 30 g 0  . Wheat Dextrin (BENEFIBER) POWD Stir 2 tsp. TID into 4-8 oz of any non-carbonated beverage or soft food (hot or cold) 500 g PRN   No current facility-administered medications for this visit.    PHYSICAL EXAMINATION: ECOG PERFORMANCE STATUS: 1 - Symptomatic but completely ambulatory  BP 140/81 (BP Location: Right Arm, Patient Position: Sitting)   Pulse 87   Temp 98.3 F (36.8 C) (Tympanic)   Resp 18   Wt 165 lb (74.8 kg)   SpO2 100%   BMI 23.01 kg/m   Filed Weights   02/28/19 1031  Weight: 165 lb (74.8 kg)    Physical Exam  Constitutional: He is oriented to person, place, and time.   Frail-appearing Caucasian male patient.  He is in a walking with a cane.   He is alone.  HENT:  Head: Normocephalic and atraumatic.  Mouth/Throat: Oropharynx is clear and moist. No oropharyngeal exudate.  Poor dentition.  Eyes: Pupils are equal, round, and reactive to light.  Cardiovascular: Normal rate and regular rhythm.  Pulmonary/Chest: No respiratory distress. He has no wheezes.  Decreased breath sounds bilaterally at the bases.  No wheeze or crackles  Abdominal: Soft. Bowel sounds are normal. He exhibits no distension and no mass. There is no abdominal tenderness. There is no rebound and no guarding.  Musculoskeletal:        General: No tenderness or edema. Normal range of motion.     Cervical back: Normal range of motion and neck supple.  Neurological: He is alert and oriented to person, place, and time.  Skin: Skin is warm.  Macular rash on hands  Psychiatric: Affect normal.    LABORATORY DATA:  I have reviewed the data as listed    Component Value Date/Time   NA 136 02/27/2019 1331   NA 140 07/13/2013 1542   K 3.5 02/27/2019 1331   K 4.3 07/13/2013 1542   CL 103 02/27/2019 1331   CL 107 07/13/2013 1542   CO2 22  02/27/2019 1331   CO2 26 07/13/2013 1542   GLUCOSE 98 02/27/2019 1331   GLUCOSE 106 (H) 07/13/2013 1542   BUN 7 (L) 02/27/2019 1331   BUN 11 07/13/2013 1542   CREATININE 0.70 02/27/2019 1331   CREATININE 0.76 10/15/2017 1001   CALCIUM 8.7 (L) 02/27/2019 1331   CALCIUM 9.9  07/13/2013 1542   PROT 5.5 (L) 02/27/2019 1331   PROT 8.1 07/13/2013 1542   ALBUMIN 3.2 (L) 02/27/2019 1331   ALBUMIN 4.3 07/13/2013 1542   AST 17 02/27/2019 1331   AST 26 07/13/2013 1542   ALT 8 02/27/2019 1331   ALT 32 07/13/2013 1542   ALKPHOS 37 (L) 02/27/2019 1331   ALKPHOS 74 07/13/2013 1542   BILITOT 0.4 02/27/2019 1331   BILITOT 0.4 07/13/2013 1542   GFRNONAA >60 02/27/2019 1331   GFRNONAA 95 10/15/2017 1001   GFRAA >60 02/27/2019 1331   GFRAA 110 10/15/2017 1001    No results found for: SPEP, UPEP  Lab Results  Component Value Date   WBC 5.6 02/27/2019   NEUTROABS 3.1 02/27/2019   HGB 10.2 (L) 02/27/2019   HCT 31.4 (L) 02/27/2019   MCV 95.7 02/27/2019   PLT 200 02/27/2019      Chemistry      Component Value Date/Time   NA 136 02/27/2019 1331   NA 140 07/13/2013 1542   K 3.5 02/27/2019 1331   K 4.3 07/13/2013 1542   CL 103 02/27/2019 1331   CL 107 07/13/2013 1542   CO2 22 02/27/2019 1331   CO2 26 07/13/2013 1542   BUN 7 (L) 02/27/2019 1331   BUN 11 07/13/2013 1542   CREATININE 0.70 02/27/2019 1331   CREATININE 0.76 10/15/2017 1001      Component Value Date/Time   CALCIUM 8.7 (L) 02/27/2019 1331   CALCIUM 9.9 07/13/2013 1542   ALKPHOS 37 (L) 02/27/2019 1331   ALKPHOS 74 07/13/2013 1542   AST 17 02/27/2019 1331   AST 26 07/13/2013 1542   ALT 8 02/27/2019 1331   ALT 32 07/13/2013 1542   BILITOT 0.4 02/27/2019 1331   BILITOT 0.4 07/13/2013 1542     RADIOGRAPHIC STUDIES: I have personally reviewed the radiological images as listed and agreed with the findings in the report. No results found.   ASSESSMENT & PLAN:  Prostate cancer metastatic to bone ([place]) # Castrate  resistant prostate cancer metastases to bone. Lupron every 3 months [due 04/03/2019]. Sep 2020; PSA- 190; rising. PET aux- 10/15- Progressive bone/ Left suprahilar-mediastinal LN. Currently, on Taxotere weekly. Clinically, stable.  PSA- 76; improving.   # Proceed with taxotere # 13 [w-On&;1w-OFF] Labs today reviewed;  acceptable for treatment today.   # Extreme fatigue/nausea -over stable.   # Rash on hands-likely secondary chemotherapy; recommend steroid cream.  # Malignancy related pain- continue fentanyl patch hydrocodone;. Continue fentanyl patch to every 48 hours and continue oxycodone prn; STABLE.    # DISPOSITION:  # Taxotere today # 1 week- MD; Reva Bores- cbc/cmp 1 day prior in [place];] Taxoetere-Dr.B    No orders of the defined types were placed in this encounter.  All questions were answered. The patient knows to call the clinic with any problems, questions or concerns.      [name], MD 02/28/2019 11:10 AM

## 2019-02-28 NOTE — Assessment & Plan Note (Addendum)
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 Lupron every 3 months [due 04/03/2019]. Sep 2020; PSA- 190; rising. PET aux- 10/15- Progressive bone/ Left suprahilar-mediastinal LN. Currently, on Taxotere weekly. Clinically, stable.  PSA- 76; improving.   # Proceed with taxotere # 13 [w-On&;1w-OFF] Labs today reviewed;  acceptable for treatment today.   # Extreme fatigue/nausea -over stable.   # Rash on hands-likely secondary chemotherapy; recommend steroid cream.  # Malignancy related pain- continue fentanyl patch hydrocodone;. Continue fentanyl patch to every 48 hours and continue oxycodone prn; STABLE.    # DISPOSITION:  # Taxotere today # 1 week- MD; Reva Bores- cbc/cmp 1 day prior in [place];] Taxoetere-Dr.B

## 2019-03-05 ENCOUNTER — Other Ambulatory Visit: Payer: Self-pay | Admitting: Internal Medicine

## 2019-03-05 DIAGNOSIS — C7951 Secondary malignant neoplasm of bone: Secondary | ICD-10-CM

## 2019-03-05 DIAGNOSIS — C61 Malignant neoplasm of prostate: Secondary | ICD-10-CM

## 2019-03-05 DIAGNOSIS — G893 Neoplasm related pain (acute) (chronic): Secondary | ICD-10-CM

## 2019-03-05 MED ORDER — FENTANYL 100 MCG/HR TD PT72: 1.0000 | MEDICATED_PATCH | TRANSDERMAL | 0 refills | Status: DC

## 2019-03-05 MED ORDER — OXYCODONE HCL 15 MG PO TABS: 15.0000 mg | ORAL_TABLET | Freq: Three times a day (TID) | ORAL | 0 refills | Status: DC | PRN

## 2019-03-05 MED ORDER — FENTANYL 25 MCG/HR TD PT72: 1.0000 | MEDICATED_PATCH | TRANSDERMAL | 0 refills | Status: DC

## 2019-03-06 ENCOUNTER — Other Ambulatory Visit: Payer: Self-pay

## 2019-03-06 ENCOUNTER — Inpatient Hospital Stay: Payer: 59 | Attending: Hematology and Oncology

## 2019-03-06 DIAGNOSIS — R21 Rash and other nonspecific skin eruption: Secondary | ICD-10-CM | POA: Insufficient documentation

## 2019-03-06 DIAGNOSIS — F329 Major depressive disorder, single episode, unspecified: Secondary | ICD-10-CM | POA: Insufficient documentation

## 2019-03-06 DIAGNOSIS — I2510 Atherosclerotic heart disease of native coronary artery without angina pectoris: Secondary | ICD-10-CM | POA: Diagnosis not present

## 2019-03-06 DIAGNOSIS — Z955 Presence of coronary angioplasty implant and graft: Secondary | ICD-10-CM | POA: Insufficient documentation

## 2019-03-06 DIAGNOSIS — Z79899 Other long term (current) drug therapy: Secondary | ICD-10-CM | POA: Insufficient documentation

## 2019-03-06 DIAGNOSIS — R519 Headache, unspecified: Secondary | ICD-10-CM | POA: Insufficient documentation

## 2019-03-06 DIAGNOSIS — C61 Malignant neoplasm of prostate: Secondary | ICD-10-CM | POA: Diagnosis not present

## 2019-03-06 DIAGNOSIS — F17210 Nicotine dependence, cigarettes, uncomplicated: Secondary | ICD-10-CM | POA: Diagnosis not present

## 2019-03-06 DIAGNOSIS — E785 Hyperlipidemia, unspecified: Secondary | ICD-10-CM | POA: Insufficient documentation

## 2019-03-06 DIAGNOSIS — Z7982 Long term (current) use of aspirin: Secondary | ICD-10-CM | POA: Insufficient documentation

## 2019-03-06 DIAGNOSIS — C7951 Secondary malignant neoplasm of bone: Secondary | ICD-10-CM | POA: Insufficient documentation

## 2019-03-06 DIAGNOSIS — R110 Nausea: Secondary | ICD-10-CM | POA: Diagnosis not present

## 2019-03-06 DIAGNOSIS — I10 Essential (primary) hypertension: Secondary | ICD-10-CM | POA: Diagnosis not present

## 2019-03-06 DIAGNOSIS — Z9079 Acquired absence of other genital organ(s): Secondary | ICD-10-CM | POA: Diagnosis not present

## 2019-03-06 LAB — COMPREHENSIVE METABOLIC PANEL
ALT: 8 U/L (ref 0–44)
AST: 15 U/L (ref 15–41)
Albumin: 3.6 g/dL (ref 3.5–5.0)
Alkaline Phosphatase: 31 U/L — ABNORMAL LOW (ref 38–126)
Anion gap: 11 (ref 5–15)
BUN: 8 mg/dL (ref 8–23)
CO2: 23 mmol/L (ref 22–32)
Calcium: 9.2 mg/dL (ref 8.9–10.3)
Chloride: 102 mmol/L (ref 98–111)
Creatinine, Ser: 0.76 mg/dL (ref 0.61–1.24)
GFR calc Af Amer: >60 mL/min (ref >60)
GFR calc non Af Amer: >60 mL/min (ref >60)
Glucose, Bld: 117 mg/dL — ABNORMAL HIGH (ref 70–99)
Potassium: 3.6 mmol/L (ref 3.5–5.1)
Sodium: 136 mmol/L (ref 135–145)
Total Bilirubin: 0.6 mg/dL (ref 0.3–1.2)
Total Protein: 5.6 g/dL — ABNORMAL LOW (ref 6.5–8.1)

## 2019-03-06 LAB — CBC WITH DIFFERENTIAL/PLATELET
Abs Immature Granulocytes: 0.02 10*3/uL (ref 0.00–0.07)
Basophils Absolute: 0 10*3/uL (ref 0.0–0.1)
Basophils Relative: 1 %
Eosinophils Absolute: 0 10*3/uL (ref 0.0–0.5)
Eosinophils Relative: 0 %
HCT: 35.4 % — ABNORMAL LOW (ref 39.0–52.0)
Hemoglobin: 11.4 g/dL — ABNORMAL LOW (ref 13.0–17.0)
Immature Granulocytes: 0 %
Lymphocytes Relative: 35 %
Lymphs Abs: 1.6 10*3/uL (ref 0.7–4.0)
MCH: 30.7 pg (ref 26.0–34.0)
MCHC: 32.2 g/dL (ref 30.0–36.0)
MCV: 95.4 fL (ref 80.0–100.0)
Monocytes Absolute: 0.3 10*3/uL (ref 0.1–1.0)
Monocytes Relative: 7 %
Neutro Abs: 2.6 10*3/uL (ref 1.7–7.7)
Neutrophils Relative %: 57 %
Platelets: 236 10*3/uL (ref 150–400)
RBC: 3.71 MIL/uL — ABNORMAL LOW (ref 4.22–5.81)
RDW: 15.9 % — ABNORMAL HIGH (ref 11.5–15.5)
WBC: 4.6 10*3/uL (ref 4.0–10.5)
nRBC: 0 % (ref 0.0–0.2)

## 2019-03-06 LAB — PSA: Prostatic Specific Antigen: 78.75 ng/mL — ABNORMAL HIGH (ref 0.00–4.00)

## 2019-03-07 ENCOUNTER — Inpatient Hospital Stay: Payer: 59

## 2019-03-07 ENCOUNTER — Inpatient Hospital Stay (HOSPITAL_BASED_OUTPATIENT_CLINIC_OR_DEPARTMENT_OTHER): Payer: 59 | Admitting: Internal Medicine

## 2019-03-07 ENCOUNTER — Other Ambulatory Visit: Payer: Self-pay

## 2019-03-07 ENCOUNTER — Encounter: Payer: Self-pay | Admitting: Internal Medicine

## 2019-03-07 ENCOUNTER — Ambulatory Visit
Admission: RE | Admit: 2019-03-07 | Discharge: 2019-03-07 | Disposition: A | Discharge status: Home / Self Care | Payer: 59 | Source: Ambulatory Visit | Attending: Internal Medicine | Admitting: Internal Medicine

## 2019-03-07 ENCOUNTER — Telehealth: Payer: Self-pay | Admitting: *Deleted

## 2019-03-07 ENCOUNTER — Other Ambulatory Visit: Payer: 59

## 2019-03-07 ENCOUNTER — Other Ambulatory Visit: Payer: Self-pay | Admitting: Hospice and Palliative Medicine

## 2019-03-07 VITALS — BP 150/92 | HR 103 | Temp 98.4°F | Wt 158.0 lb

## 2019-03-07 DIAGNOSIS — C61 Malignant neoplasm of prostate: Secondary | ICD-10-CM | POA: Insufficient documentation

## 2019-03-07 DIAGNOSIS — C7951 Secondary malignant neoplasm of bone: Secondary | ICD-10-CM

## 2019-03-07 DIAGNOSIS — R519 Headache, unspecified: Secondary | ICD-10-CM

## 2019-03-07 DIAGNOSIS — I25118 Atherosclerotic heart disease of native coronary artery with other forms of angina pectoris: Secondary | ICD-10-CM

## 2019-03-07 MED ORDER — PREDNISONE 5 MG PO TABS: 5.0000 mg | ORAL_TABLET | Freq: Every day | ORAL | 3 refills | Status: DC

## 2019-03-07 MED ORDER — GADOBUTROL 1 MMOL/ML IV SOLN
7.0000 mL | Freq: Once | INTRAVENOUS | Status: AC | PRN
  Administered 2019-03-07: 7 mL via INTRAVENOUS

## 2019-03-07 MED ORDER — OXYCODONE HCL 15 MG PO TABS: 15.0000 mg | ORAL_TABLET | Freq: Four times a day (QID) | ORAL | 0 refills | Status: DC | PRN

## 2019-03-07 NOTE — Telephone Encounter (Signed)
Wife call requesting a return call from Sharion Dove, NP as she has questions regarding his medications

## 2019-03-07 NOTE — Progress Notes (Signed)
[place] OFFICE PROGRESS NOTE  Patient Care Team: Olin Hauser, [name], MD as PCP - Cardiology (Cardiology) Minna Merritts, [name], [name]  Cancer Staging No matching staging information was found for the patient.   Oncology History Overview Note  # 2011- PROSTATE CANCER [Gleason 3+4]; s/p Prostatectomy [ also involved bladder neck/ECP; Dr.Polaseck]; July 2014- Biochemical recurrence [PSA 14]- started on Zoladex [Dr.Pandit]; lost to follow up.  # JAN 2017- STAGE IV METASTATIC PROSTATE Cancer to Bone- Feb 13th, 2017-  Lupron q 82m[~end of feb]; PSA: 1021; Declined Chemo; April 2017 [xofigo x6; Dr.Crystal]; AUG 2017- Zytiga + Prednisone BID. Bone scan-Jan 2018- improved skeletal metastases.   # MAY 1st 2019- START X-tandi [stopped zytiga/PSA- 8.8/rising]  #August 23, 2018-stop Xtandi [intolerance-fatigue mental fogginess]  #Mid September 2020-apalutamide; stopped mid-October poor tolerance/progression  # 11/01/2018- -Taxotere weekly [consent]  # Mets to bone- start X-geva q 73M [May 30th]; hold dental issues/infections  # Smoker/ chronic pain/pain clinic   #[April 2018; [place]]- s/p stenting; on Plavix; --------------------------------------------------------------  DIAGNOSIS: '[ ]'  Castrate resistant prostate cancer  STAGE:   4    ;GOALS: Palliative  CURRENT/MOST RECENT THERAPY-Late Oct 2020- Taxotere weekly [consent]    Prostate cancer metastatic to bone ([facility]  12/08/2014 Initial Diagnosis   Prostate cancer metastatic to bone ([facility]   11/01/2018 -  Chemotherapy   The patient had DOCEtaxel (TAXOTERE) 70 mg in sodium chloride 0.9 % 150 mL chemo infusion, 36 mg/m2 = 70 mg, Intravenous,  Once, 12 of 18 cycles Administration: 70 mg (11/08/2018), 70 mg (11/22/2018), 70 mg (12/13/2018), 70 mg (12/06/2018),  70 mg (12/31/2018), 70 mg (01/07/2019), 70 mg (01/29/2019), 70 mg (12/20/2018), 70 mg (02/06/2019), 70 mg (02/14/2019), 70 mg (02/28/2019)  for chemotherapy treatment.     INTERVAL HISTORY:  Robert [id number].  male pleasant patient above history of metastatic castrate resistant prostate cancer currently -on Taxotere weekly is here for follow-up.  Patient continues to complain of significant nausea.  He also complains of worsening headaches.  Complains of gait instability.  Poor appetite.  Continues to have back pain joint pains.  Continues to be on fentanyl patch and oxycodone.  No worsening swelling in the legs.  Review of Systems  Constitutional: Positive for malaise/fatigue and weight loss. Negative for chills, diaphoresis and fever.  HENT: Negative for nosebleeds and sore throat.   Eyes: Negative for double vision.  Respiratory: Negative for cough, hemoptysis, sputum production and wheezing.   Cardiovascular: Negative for chest pain, palpitations, orthopnea and leg swelling.  Gastrointestinal: Positive for nausea. Negative for abdominal pain, blood in stool, diarrhea, heartburn, melena and vomiting.  Genitourinary: Negative for dysuria, frequency and urgency.  Musculoskeletal: Positive for back pain and joint pain.  Skin: Positive for rash. Negative for itching.  Neurological: Negative for dizziness, tingling, focal weakness, weakness and headaches.  Endo/Heme/Allergies: Does not bruise/bleed easily.  Psychiatric/Behavioral: Negative for depression. The patient is not nervous/anxious and does not have insomnia.       PAST MEDICAL HISTORY :  Past Medical History:  Diagnosis Date  . Back pain 10/09/2012  . Bone cancer ([facility]   . CAD (coronary artery disease)    a. 08/2015 PCI: LM nl, LAD 20d, LCX nl, RCA 272mRPAV 95 (3.0x18 Xience Alpine DES);  b. [facility] (2.75x8 Promus Premier DES).  . Cancer associated  pain   . Depression   . History of echocardiogram    a.  08/2016 Echo: EF 50-55%.  Marland Kitchen History of kidney stones   . Hyperlipidemia   . Hypertension   . Joint pain   . Prostate cancer [facility])    a.  s/p prostatectomy ([facility]);  b. bone mets noted 04/2016.  . Right arm pain 01/10/2016  . Right foot pain 01/10/2016  . Right leg pain 01/10/2016  . Tobacco abuse     PAST SURGICAL HISTORY :   Past Surgical History:  Procedure Laterality Date  . CARDIAC CATHETERIZATION     [facility]  . CARDIAC CATHETERIZATION N/A 08/16/2015   Procedure: Left Heart Cath and Coronary Angiography;  Surgeon: Yolonda Kida, MD;  Location: [place] [facility];  Service: Cardiovascular;  Laterality: N/A;  . CARDIAC CATHETERIZATION N/A 08/16/2015   Procedure: Coronary Stent Intervention;  Surgeon: Yolonda Kida, MD;  Location: [place] [facility];  Service: Cardiovascular;  Laterality: N/A;  . CERVIX SURGERY    . PROSTATECTOMY    . SPINE SURGERY    . TONSILLECTOMY    . WRIST SURGERY      FAMILY HISTORY :   Family History  Problem Relation Age of Onset  . Heart attack Mother   . Hypertension Mother   . Heart attack Father     SOCIAL HISTORY:   Social History   Tobacco Use  . Smoking status: Current Every Day Smoker    Packs/day: 0.50    Years: 40.00    Pack years: 20.00    Types: Cigarettes  . Smokeless tobacco: Current User    Types: Chew  . Tobacco comment: 1 pack every couple days   Substance Use Topics  . Alcohol use: Yes    Comment: occasionally  . Drug use: No    ALLERGIES:  is allergic to ditropan [oxybutynin].  MEDICATIONS:  Current Outpatient Medications  Medication Sig Dispense Refill  . acetaminophen (TYLENOL) 500 MG tablet Take 1,000 mg by mouth every 8 (eight) hours as needed.     Marland Kitchen albuterol (PROVENTIL HFA;VENTOLIN HFA) 108 (90 Base) MCG/ACT inhaler Inhale 1-2 puffs into the lungs every 6 (six) hours as needed for wheezing or shortness of breath. 1 Inhaler 0  . aspirin 81 MG tablet Take 81 mg by mouth daily.    Marland Kitchen atorvastatin  (LIPITOR) 80 MG tablet Take 1 tablet (80 mg total) by mouth daily at 6 PM. 90 tablet 3  . chlorhexidine (PERIDEX) 0.12 % solution     . clopidogrel (PLAVIX) 75 MG tablet Take 1 tablet (75 mg total) by mouth daily. 90 tablet 3  . fentaNYL (DURAGESIC) 100 MCG/HR Place 1 patch onto the skin every other day. Along with 62mg patch for total of 125 mcg every 2 days. 15 patch 0  . fentaNYL (DURAGESIC) 25 MCG/HR Place 1 patch onto the skin every other day. Use this along with fentanyl patch 100 mcg [total 125 mcg] every 2 days 15 patch 0  . gabapentin (NEURONTIN) 100 MG capsule Take 1 capsule (100 mg total) by mouth 3 (three) times daily. 90 capsule 5  . magnesium hydroxide (MILK OF MAGNESIA) 400 MG/5ML suspension Take 5 mLs by mouth daily as needed for constipation.    . metoprolol tartrate (LOPRESSOR) 25 MG tablet TAKE 1/2 TABLET BY MOUTH 2 TIMES DAILY. 90 tablet 1  . naloxone (NARCAN) nasal spray 4 mg/0.1 mL 1 spray into nostril upon signs of opioid overdose. Call 911. May repeat  once if no response within 2-3 minutes. 1 kit 0  . nitroGLYCERIN (NITROSTAT) 0.4 MG SL tablet Place 1 tablet (0.4 mg total) under the tongue every 5 (five) minutes as needed. 25 tablet 3  . ondansetron (ZOFRAN) 8 MG tablet One pill every 8 hours as needed for nausea/vomitting. 40 tablet 1  . oxyCODONE (ROXICODONE) 15 MG immediate release tablet Take 1 tablet (15 mg total) by mouth every 8 (eight) hours as needed. for pain 90 tablet 0  . polyethylene glycol (MIRALAX / GLYCOLAX) packet Take 17 g by mouth daily as needed for moderate constipation.    . predniSONE (DELTASONE) 5 MG tablet TAKE 1 TABLET BY MOUTH DAILY WITH BREAKFAST. (Patient taking differently: Take 2.5 mg by mouth daily with breakfast. ) 30 tablet 1  . promethazine (PHENERGAN) 25 MG tablet TAKE 1/2 TO 1 TABLET (12.5-25 MG TOTAL) BY MOUTH EVERY 8 (EIGHT) HOURS AS NEEDED FOR NAUSEA OR VOMITING. 60 tablet 3  . traZODone (DESYREL) 50 MG tablet Take 1-2 tablets (50-100 mg  total) by mouth at bedtime as needed for sleep. 60 tablet 2  . triamcinolone ointment (KENALOG) 0.5 % Apply 1 application topically 2 (two) times daily. 30 g 0  . Wheat Dextrin (BENEFIBER) POWD Stir 2 tsp. TID into 4-8 oz of any non-carbonated beverage or soft food (hot or cold) 500 g PRN  . predniSONE (DELTASONE) 5 MG tablet Take 1 tablet (5 mg total) by mouth daily with breakfast. 30 tablet 3   No current facility-administered medications for this visit.    PHYSICAL EXAMINATION: ECOG PERFORMANCE STATUS: 1 - Symptomatic but completely ambulatory  BP (!) 150/92 (BP Location: Left Arm, Patient Position: Sitting, Cuff Size: Normal)   Pulse (!) 103   Temp 98.4 F (36.9 C) (Tympanic)   Wt 158 lb (71.7 kg)   SpO2 100% Comment: room air  BMI 22.04 kg/m   Filed Weights   03/07/19 0933  Weight: 158 lb (71.7 kg)    Physical Exam  Constitutional: He is oriented to person, place, and time.   Frail-appearing Caucasian male patient.  He is in a walking with a cane.   Accompanied by his wife.  HENT:  Head: Normocephalic and atraumatic.  Mouth/Throat: Oropharynx is clear and moist. No oropharyngeal exudate.  Poor dentition.  Eyes: Pupils are equal, round, and reactive to light.  Cardiovascular: Normal rate and regular rhythm.  Pulmonary/Chest: No respiratory distress. He has no wheezes.  Decreased breath sounds bilaterally at the bases.  No wheeze or crackles  Abdominal: Soft. Bowel sounds are normal. He exhibits no distension and no mass. There is no abdominal tenderness. There is no rebound and no guarding.  Musculoskeletal:        General: No tenderness or edema. Normal range of motion.     Cervical back: Normal range of motion and neck supple.  Neurological: He is alert and oriented to person, place, and time.  Skin: Skin is warm.  Macular rash on hands  Psychiatric: Affect normal.    LABORATORY DATA:  I have reviewed the data as listed    Component Value Date/Time   NA 136  03/06/2019 1337   NA 140 07/13/2013 1542   K 3.6 03/06/2019 1337   K 4.3 07/13/2013 1542   CL 102 03/06/2019 1337   CL 107 07/13/2013 1542   CO2 23 03/06/2019 1337   CO2 26 07/13/2013 1542   GLUCOSE 117 (H) 03/06/2019 1337   GLUCOSE 106 (H) 07/13/2013 1542   BUN 8  03/06/2019 1337   BUN 11 07/13/2013 1542   CREATININE 0.76 03/06/2019 1337   CREATININE 0.76 10/15/2017 1001   CALCIUM 9.2 03/06/2019 1337   CALCIUM 9.9 07/13/2013 1542   PROT 5.6 (L) 03/06/2019 1337   PROT 8.1 07/13/2013 1542   ALBUMIN 3.6 03/06/2019 1337   ALBUMIN 4.3 07/13/2013 1542   AST 15 03/06/2019 1337   AST 26 07/13/2013 1542   ALT 8 03/06/2019 1337   ALT 32 07/13/2013 1542   ALKPHOS 31 (L) 03/06/2019 1337   ALKPHOS 74 07/13/2013 1542   BILITOT 0.6 03/06/2019 1337   BILITOT 0.4 07/13/2013 1542   GFRNONAA >60 03/06/2019 1337   GFRNONAA 95 10/15/2017 1001   GFRAA >60 03/06/2019 1337   GFRAA 110 10/15/2017 1001    No results found for: SPEP, UPEP  Lab Results  Component Value Date   WBC 4.6 03/06/2019   NEUTROABS 2.6 03/06/2019   HGB 11.4 (L) 03/06/2019   HCT 35.4 (L) 03/06/2019   MCV 95.4 03/06/2019   PLT 236 03/06/2019      Chemistry      Component Value Date/Time   NA 136 03/06/2019 1337   NA 140 07/13/2013 1542   K 3.6 03/06/2019 1337   K 4.3 07/13/2013 1542   CL 102 03/06/2019 1337   CL 107 07/13/2013 1542   CO2 23 03/06/2019 1337   CO2 26 07/13/2013 1542   BUN 8 03/06/2019 1337   BUN 11 07/13/2013 1542   CREATININE 0.76 03/06/2019 1337   CREATININE 0.76 10/15/2017 1001      Component Value Date/Time   CALCIUM 9.2 03/06/2019 1337   CALCIUM 9.9 07/13/2013 1542   ALKPHOS 31 (L) 03/06/2019 1337   ALKPHOS 74 07/13/2013 1542   AST 15 03/06/2019 1337   AST 26 07/13/2013 1542   ALT 8 03/06/2019 1337   ALT 32 07/13/2013 1542   BILITOT 0.6 03/06/2019 1337   BILITOT 0.4 07/13/2013 1542     RADIOGRAPHIC STUDIES: I have personally reviewed the radiological images as listed and  agreed with the findings in the report. No results found.   ASSESSMENT & PLAN:  Prostate cancer metastatic to bone ([place]) # Castrate resistant prostate cancer metastases to bone. Lupron every 3 months [due 04/03/2019]. Sep 2020; PSA- 190; rising. PET aux- 10/15- Progressive bone/ Left suprahilar-mediastinal LN. Currently, on Taxotere weekly. Clinically, stable.  PSA- 78; improving.   #Hold chemotherapy Taxotere No. 14 today-because feeling poorly [see below]; also discussed patient's symptoms of severe fatigue-is likely secondary chemotherapy and I think is reasonable to switch treatments to every other week.  #Nausea without vomiting with worsening headaches; gait instability concerning for metastasis to the brain.  Recommend stat MRI brain.  Prednisone 5 mg a day.  # Rash on hands-likely secondary chemotherapy stable.  # Malignancy related pain- continue fentanyl patch hydrocodone;. Continue fentanyl patch to every 48 hours and continue oxycodone prn; new prescription sent stable.  # DISPOSITION:  # Taxotere HOLD;  # MRI brain STAT # 1 week- MD; Reva Bores- cbc/cmp 1 day prior in [place];] Taxoetere-Dr.B    Orders Placed This Encounter  Procedures  . MR Brain W Wo Contrast    Patient to wait until report called    Standing Status:   Future    Standing Expiration Date:   03/06/2020    Order Specific Question:   ** REASON FOR EXAM (FREE TEXT)    Answer:   worsening headaches; nasuea; prostate cancer    Order Specific Question:  If indicated for the ordered procedure, I authorize the administration of contrast media per Radiology protocol    Answer:   Yes    Order Specific Question:   What is the patient's sedation requirement?    Answer:   No Sedation    Order Specific Question:   Does the patient have a pacemaker or implanted devices?    Answer:   No    Order Specific Question:   Use SRS Protocol?    Answer:   No    Order Specific Question:   Call Results- Best Contact Number?    Answer:    [phone]    Order Specific Question:   Radiology Contrast Protocol - do NOT remove file path    Answer:   [email or web address]    Order Specific Question:   Preferred imaging location?    Answer:   [facility] (table limit-400lbs)   All questions were answered. The patient knows to call the clinic with any problems, questions or concerns.      [name], MD 03/07/2019 10:55 AM

## 2019-03-07 NOTE — Telephone Encounter (Signed)
I called and spoke with patient's wife. Last oxycodone prescription was changed to [facility] dosing. Patient is still requiring Q6H to fully control his pain. Will refill at [facility] dosing.

## 2019-03-07 NOTE — Assessment & Plan Note (Addendum)
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 Lupron every 3 months [due 04/03/2019]. Sep 2020; PSA- 190; rising. PET aux- 10/15- Progressive bone/ Left suprahilar-mediastinal LN. Currently, on Taxotere weekly. Clinically, stable.  PSA- 78; improving.   #Hold chemotherapy Taxotere No. 14 today-because feeling poorly [see below]; also discussed patient's symptoms of severe fatigue-is likely secondary chemotherapy and I think is reasonable to switch treatments to every other week.  #Nausea without vomiting with worsening headaches; gait instability concerning for metastasis to the brain.  Recommend stat MRI brain.  Prednisone 5 mg a day.  # Rash on hands-likely secondary chemotherapy stable.  # Malignancy related pain- continue fentanyl patch hydrocodone;. Continue fentanyl patch to every 48 hours and continue oxycodone prn; new prescription sent stable.  # DISPOSITION:  # Taxotere HOLD;  # MRI brain STAT # 1 week- MD; Reva Bores- cbc/cmp 1 day prior in [place];] Taxoetere-Dr.B

## 2019-03-13 ENCOUNTER — Other Ambulatory Visit: Payer: Self-pay

## 2019-03-13 ENCOUNTER — Encounter: Payer: Self-pay | Admitting: Internal Medicine

## 2019-03-13 ENCOUNTER — Telehealth: Payer: Self-pay

## 2019-03-13 ENCOUNTER — Inpatient Hospital Stay: Payer: 59

## 2019-03-13 DIAGNOSIS — R519 Headache, unspecified: Secondary | ICD-10-CM | POA: Diagnosis not present

## 2019-03-13 DIAGNOSIS — C61 Malignant neoplasm of prostate: Secondary | ICD-10-CM | POA: Diagnosis not present

## 2019-03-13 DIAGNOSIS — F17210 Nicotine dependence, cigarettes, uncomplicated: Secondary | ICD-10-CM | POA: Diagnosis not present

## 2019-03-13 DIAGNOSIS — C7951 Secondary malignant neoplasm of bone: Secondary | ICD-10-CM

## 2019-03-13 DIAGNOSIS — R21 Rash and other nonspecific skin eruption: Secondary | ICD-10-CM | POA: Diagnosis not present

## 2019-03-13 DIAGNOSIS — I2510 Atherosclerotic heart disease of native coronary artery without angina pectoris: Secondary | ICD-10-CM | POA: Diagnosis not present

## 2019-03-13 DIAGNOSIS — Z79899 Other long term (current) drug therapy: Secondary | ICD-10-CM | POA: Diagnosis not present

## 2019-03-13 DIAGNOSIS — I10 Essential (primary) hypertension: Secondary | ICD-10-CM | POA: Diagnosis not present

## 2019-03-13 DIAGNOSIS — R110 Nausea: Secondary | ICD-10-CM | POA: Diagnosis not present

## 2019-03-13 LAB — CBC WITH DIFFERENTIAL/PLATELET
Abs Immature Granulocytes: 0.05 10*3/uL (ref 0.00–0.07)
Basophils Absolute: 0 10*3/uL (ref 0.0–0.1)
Basophils Relative: 0 %
Eosinophils Absolute: 0 10*3/uL (ref 0.0–0.5)
Eosinophils Relative: 0 %
HCT: 33.8 % — ABNORMAL LOW (ref 39.0–52.0)
Hemoglobin: 10.9 g/dL — ABNORMAL LOW (ref 13.0–17.0)
Immature Granulocytes: 1 %
Lymphocytes Relative: 13 %
Lymphs Abs: 0.8 10*3/uL (ref 0.7–4.0)
MCH: 31.1 pg (ref 26.0–34.0)
MCHC: 32.2 g/dL (ref 30.0–36.0)
MCV: 96.6 fL (ref 80.0–100.0)
Monocytes Absolute: 0.7 10*3/uL (ref 0.1–1.0)
Monocytes Relative: 10 %
Neutro Abs: 5 10*3/uL (ref 1.7–7.7)
Neutrophils Relative %: 76 %
Platelets: 228 10*3/uL (ref 150–400)
RBC: 3.5 MIL/uL — ABNORMAL LOW (ref 4.22–5.81)
RDW: 16.9 % — ABNORMAL HIGH (ref 11.5–15.5)
WBC: 6.6 10*3/uL (ref 4.0–10.5)
nRBC: 0 % (ref 0.0–0.2)

## 2019-03-13 LAB — PSA: Prostatic Specific Antigen: 75.93 ng/mL — ABNORMAL HIGH (ref 0.00–4.00)

## 2019-03-13 LAB — COMPREHENSIVE METABOLIC PANEL
ALT: 12 U/L (ref 0–44)
AST: 18 U/L (ref 15–41)
Albumin: 4.1 g/dL (ref 3.5–5.0)
Alkaline Phosphatase: 35 U/L — ABNORMAL LOW (ref 38–126)
Anion gap: 15 (ref 5–15)
BUN: 11 mg/dL (ref 8–23)
CO2: 20 mmol/L — ABNORMAL LOW (ref 22–32)
Calcium: 9.6 mg/dL (ref 8.9–10.3)
Chloride: 101 mmol/L (ref 98–111)
Creatinine, Ser: 0.98 mg/dL (ref 0.61–1.24)
GFR calc Af Amer: >60 mL/min (ref >60)
GFR calc non Af Amer: >60 mL/min (ref >60)
Glucose, Bld: 105 mg/dL — ABNORMAL HIGH (ref 70–99)
Potassium: 4.4 mmol/L (ref 3.5–5.1)
Sodium: 136 mmol/L (ref 135–145)
Total Bilirubin: 0.4 mg/dL (ref 0.3–1.2)
Total Protein: 6.2 g/dL — ABNORMAL LOW (ref 6.5–8.1)

## 2019-03-14 ENCOUNTER — Other Ambulatory Visit: Payer: Self-pay

## 2019-03-14 ENCOUNTER — Inpatient Hospital Stay (HOSPITAL_BASED_OUTPATIENT_CLINIC_OR_DEPARTMENT_OTHER): Payer: 59 | Admitting: Internal Medicine

## 2019-03-14 ENCOUNTER — Inpatient Hospital Stay: Payer: 59

## 2019-03-14 VITALS — BP 158/90 | HR 74 | Resp 20

## 2019-03-14 DIAGNOSIS — C61 Malignant neoplasm of prostate: Secondary | ICD-10-CM

## 2019-03-14 DIAGNOSIS — C7951 Secondary malignant neoplasm of bone: Secondary | ICD-10-CM

## 2019-03-14 DIAGNOSIS — I10 Essential (primary) hypertension: Secondary | ICD-10-CM | POA: Diagnosis not present

## 2019-03-14 DIAGNOSIS — I2510 Atherosclerotic heart disease of native coronary artery without angina pectoris: Secondary | ICD-10-CM | POA: Diagnosis not present

## 2019-03-14 DIAGNOSIS — Z7189 Other specified counseling: Secondary | ICD-10-CM

## 2019-03-14 DIAGNOSIS — R519 Headache, unspecified: Secondary | ICD-10-CM | POA: Diagnosis not present

## 2019-03-14 DIAGNOSIS — R21 Rash and other nonspecific skin eruption: Secondary | ICD-10-CM | POA: Diagnosis not present

## 2019-03-14 DIAGNOSIS — R110 Nausea: Secondary | ICD-10-CM | POA: Diagnosis not present

## 2019-03-14 DIAGNOSIS — I25118 Atherosclerotic heart disease of native coronary artery with other forms of angina pectoris: Secondary | ICD-10-CM

## 2019-03-14 DIAGNOSIS — Z79899 Other long term (current) drug therapy: Secondary | ICD-10-CM | POA: Diagnosis not present

## 2019-03-14 DIAGNOSIS — F17210 Nicotine dependence, cigarettes, uncomplicated: Secondary | ICD-10-CM | POA: Diagnosis not present

## 2019-03-14 MED ORDER — SODIUM CHLORIDE 0.9 % IV SOLN
Freq: Once | INTRAVENOUS | Status: AC
  Filled 2019-03-14: qty 250

## 2019-03-14 MED ORDER — DEXAMETHASONE SODIUM PHOSPHATE 10 MG/ML IJ SOLN
10.0000 mg | Freq: Once | INTRAMUSCULAR | Status: AC
  Administered 2019-03-14: 10 mg via INTRAVENOUS
  Filled 2019-03-14: qty 1

## 2019-03-14 MED ORDER — SODIUM CHLORIDE 0.9 % IV SOLN
36.0000 mg/m2 | Freq: Once | INTRAVENOUS | Status: AC
  Administered 2019-03-14: 70 mg via INTRAVENOUS
  Filled 2019-03-14: qty 7

## 2019-03-14 NOTE — Progress Notes (Signed)
[facility] OFFICE PROGRESS NOTE  Patient Care Team: Olin Hauser, [name], MD as PCP - Cardiology (Cardiology) Minna Merritts, [name], [name]  Cancer Staging No matching staging information was found for the patient.   Oncology History Overview Note  # 2011- PROSTATE CANCER [Gleason 3+4]; s/p Prostatectomy [ also involved bladder neck/ECP; Dr.Polaseck]; July 2014- Biochemical recurrence [PSA 14]- started on Zoladex [Dr.Pandit]; lost to follow up.  # JAN 2017- STAGE IV METASTATIC PROSTATE Cancer to Bone- Feb 13th, 2017-  Lupron q 27m[~end of feb]; PSA: 1021; Declined Chemo; April 2017 [xofigo x6; Dr.Crystal]; AUG 2017- Zytiga + Prednisone BID. Bone scan-Jan 2018- improved skeletal metastases.   # MAY 1st 2019- START X-tandi [stopped zytiga/PSA- 8.8/rising]  #August 23, 2018-stop Xtandi [intolerance-fatigue mental fogginess]  #Mid September 2020-apalutamide; stopped mid-October poor tolerance/progression  # 11/01/2018- -Taxotere weekly [consent]  # Mets to bone- start X-geva q 76M [May 30th]; hold dental issues/infections  # Smoker/ chronic pain/pain clinic   #[April 2018; [place]]- s/p stenting; on Plavix; --------------------------------------------------------------  DIAGNOSIS: '[ ]'  Castrate resistant prostate cancer  STAGE:   4    ;GOALS: Palliative  CURRENT/MOST RECENT THERAPY-Late Oct 2020- Taxotere weekly [consent]    [place]  12/08/2014 Initial Diagnosis   Pro[place] cancer metastatic to bone ([facility]   11/01/2018 -  Chemotherapy   The patient had DOCEtaxel (TAXOTERE) 70 mg in sodium chloride 0.9 % 150 mL chemo infusion, 36 mg/m2 = 70 mg, Intravenous,  Once, 13 of 18 cycles Administration: 70 mg (11/08/2018), 70 mg (11/22/2018), 70 mg (12/13/2018), 70 mg (12/06/2018),  70 mg (12/31/2018), 70 mg (01/07/2019), 70 mg (01/29/2019), 70 mg (12/20/2018), 70 mg (02/06/2019), 70 mg (02/14/2019), 70 mg (02/28/2019), 70 mg (03/14/2019)  for chemotherapy treatment.     INTERVAL HISTORY:  Robert [id number].  male pleasant patient above history of metastatic castrate resistant prostate cancer currently -on Taxotere weekly is here for follow-up/ review brain MRI.  Patient complains of continued chronic back pain joint pains.  Continues by narcotics.  He denies any worsening headaches.  Notes have improvement of the symptoms of chemotherapy last week.  No falls.   Review of Systems  Constitutional: Positive for malaise/fatigue and weight loss. Negative for chills, diaphoresis and fever.  HENT: Negative for nosebleeds and sore throat.   Eyes: Negative for double vision.  Respiratory: Negative for cough, hemoptysis, sputum production and wheezing.   Cardiovascular: Negative for chest pain, palpitations, orthopnea and leg swelling.  Gastrointestinal: Positive for nausea. Negative for abdominal pain, blood in stool, diarrhea, heartburn, melena and vomiting.  Genitourinary: Negative for dysuria, frequency and urgency.  Musculoskeletal: Positive for back pain and joint pain.  Skin: Positive for rash. Negative for itching.  Neurological: Negative for dizziness, tingling, focal weakness, weakness and headaches.  Endo/Heme/Allergies: Does not bruise/bleed easily.  Psychiatric/Behavioral: Negative for depression. The patient is not nervous/anxious and does not have insomnia.       PAST MEDICAL HISTORY :  Past Medical History:  Diagnosis Date  . Back pain 10/09/2012  . Bone cancer ([facility]   . CAD (coronary artery disease)    a. 08/2015 PCI: LM nl, LAD 20d, LCX nl, RCA 244mRPAV 95 (3.0x18 Xience Alpine DES);  b. [facility] R[facility] (2.75x8 Promus Premier DES).  . Cancer associated pain   . Depression   .  History of echocardiogram    a. 08/2016 Echo: EF 50-55%.  Marland Kitchen  History of kidney stones   . Hyperlipidemia   . Hypertension   . Joint pain   . Prostate cancer [facility])    a.  s/p prostatectomy ([facility]);  b. bone mets noted 04/2016.  . Right arm pain 01/10/2016  . Right foot pain 01/10/2016  . Right leg pain 01/10/2016  . Tobacco abuse     PAST SURGICAL HISTORY :   Past Surgical History:  Procedure Laterality Date  . CARDIAC CATHETERIZATION     [facility]  . CARDIAC CATHETERIZATION N/A 08/16/2015   Procedure: Left Heart Cath and Coronary Angiography;  Surgeon: Yolonda Kida, MD;  Location: [place] CV LAB;  Service: Cardiovascular;  Laterality: N/A;  . CARDIAC CATHETERIZATION N/A 08/16/2015   Procedure: Coronary Stent Intervention;  Surgeon: Yolonda Kida, MD;  Location: [place] CV LAB;  Service: Cardiovascular;  Laterality: N/A;  . CERVIX SURGERY    . PROSTATECTOMY    . SPINE SURGERY    . TONSILLECTOMY    . WRIST SURGERY      FAMILY HISTORY :   Family History  Problem Relation Age of Onset  . Heart attack Mother   . Hypertension Mother   . Heart attack Father     SOCIAL HISTORY:   Social History   Tobacco Use  . Smoking status: Current Every Day Smoker    Packs/day: 0.50    Years: 40.00    Pack years: 20.00    Types: Cigarettes  . Smokeless tobacco: Current User    Types: Chew  . Tobacco comment: 1 pack every couple days   Substance Use Topics  . Alcohol use: Yes    Comment: occasionally  . Drug use: No    ALLERGIES:  is allergic to ditropan [oxybutynin].  MEDICATIONS:  Current Outpatient Medications  Medication Sig Dispense Refill  . acetaminophen (TYLENOL) 500 MG tablet Take 1,000 mg by mouth every 8 (eight) hours as needed.     Marland Kitchen albuterol (PROVENTIL HFA;VENTOLIN HFA) 108 (90 Base) MCG/ACT inhaler Inhale 1-2 puffs into the lungs every 6 (six) hours as needed for wheezing or shortness of breath. 1 Inhaler 0  . aspirin 81 MG tablet Take 81 mg by mouth daily.    Marland Kitchen atorvastatin (LIPITOR) 80 MG tablet Take 1  tablet (80 mg total) by mouth daily at 6 PM. 90 tablet 3  . chlorhexidine (PERIDEX) 0.12 % solution     . clopidogrel (PLAVIX) 75 MG tablet Take 1 tablet (75 mg total) by mouth daily. 90 tablet 3  . fentaNYL (DURAGESIC) 100 MCG/HR Place 1 patch onto the skin every other day. Along with 69mg patch for total of 125 mcg every 2 days. 15 patch 0  . fentaNYL (DURAGESIC) 25 MCG/HR Place 1 patch onto the skin every other day. Use this along with fentanyl patch 100 mcg [total 125 mcg] every 2 days 15 patch 0  . gabapentin (NEURONTIN) 100 MG capsule Take 1 capsule (100 mg total) by mouth 3 (three) times daily. 90 capsule 5  . magnesium hydroxide (MILK OF MAGNESIA) 400 MG/5ML suspension Take 5 mLs by mouth daily as needed for constipation.    . metoprolol tartrate (LOPRESSOR) 25 MG tablet TAKE 1/2 TABLET BY MOUTH 2 TIMES DAILY. 90 tablet 1  . naloxone (NARCAN) nasal spray 4 mg/0.1 mL 1 spray into nostril upon signs of opioid overdose. Call 911. May repeat once if no response within 2-3 minutes. 1  kit 0  . nitroGLYCERIN (NITROSTAT) 0.4 MG SL tablet Place 1 tablet (0.4 mg total) under the tongue every 5 (five) minutes as needed. 25 tablet 3  . ondansetron (ZOFRAN) 8 MG tablet One pill every 8 hours as needed for nausea/vomitting. 40 tablet 1  . oxyCODONE (ROXICODONE) 15 MG immediate release tablet Take 1 tablet (15 mg total) by mouth every 6 (six) hours as needed. for pain 90 tablet 0  . polyethylene glycol (MIRALAX / GLYCOLAX) packet Take 17 g by mouth daily as needed for moderate constipation.    . predniSONE (DELTASONE) 5 MG tablet TAKE 1 TABLET BY MOUTH DAILY WITH BREAKFAST. (Patient taking differently: Take 2.5 mg by mouth daily with breakfast. ) 30 tablet 1  . predniSONE (DELTASONE) 5 MG tablet Take 1 tablet (5 mg total) by mouth daily with breakfast. 30 tablet 3  . promethazine (PHENERGAN) 25 MG tablet TAKE 1/2 TO 1 TABLET (12.5-25 MG TOTAL) BY MOUTH EVERY 8 (EIGHT) HOURS AS NEEDED FOR NAUSEA OR VOMITING.  60 tablet 3  . traZODone (DESYREL) 50 MG tablet Take 1-2 tablets (50-100 mg total) by mouth at bedtime as needed for sleep. 60 tablet 2  . triamcinolone ointment (KENALOG) 0.5 % Apply 1 application topically 2 (two) times daily. 30 g 0  . Wheat Dextrin (BENEFIBER) POWD Stir 2 tsp. TID into 4-8 oz of any non-carbonated beverage or soft food (hot or cold) 500 g PRN   No current facility-administered medications for this visit.    PHYSICAL EXAMINATION: ECOG PERFORMANCE STATUS: 1 - Symptomatic but completely ambulatory  BP (!) 162/95 (BP Location: Right Arm, Patient Position: Sitting)   Pulse 88   Temp (!) 95.7 F (35.4 C) (Tympanic)   Resp 20   Ht '5\' 11"'  (1.803 m)   Wt 160 lb (72.6 kg)   BMI 22.32 kg/m   Filed Weights   03/14/19 0941  Weight: 160 lb (72.6 kg)    Physical Exam  Constitutional: He is oriented to person, place, and time.   Frail-appearing Caucasian male patient.  He is in a walking with a cane.   Accompanied by his wife.  HENT:  Head: Normocephalic and atraumatic.  Mouth/Throat: Oropharynx is clear and moist. No oropharyngeal exudate.  Poor dentition.  Eyes: Pupils are equal, round, and reactive to light.  Cardiovascular: Normal rate and regular rhythm.  Pulmonary/Chest: No respiratory distress. He has no wheezes.  Decreased breath sounds bilaterally at the bases.  No wheeze or crackles  Abdominal: Soft. Bowel sounds are normal. He exhibits no distension and no mass. There is no abdominal tenderness. There is no rebound and no guarding.  Musculoskeletal:        General: No tenderness or edema. Normal range of motion.     Cervical back: Normal range of motion and neck supple.  Neurological: He is alert and oriented to person, place, and time.  Skin: Skin is warm.  Macular rash on hands  Psychiatric: Affect normal.    LABORATORY DATA:  I have reviewed the data as listed    Component Value Date/Time   NA 136 03/13/2019 1037   NA 140 07/13/2013 1542   K 4.4  03/13/2019 1037   K 4.3 07/13/2013 1542   CL 101 03/13/2019 1037   CL 107 07/13/2013 1542   CO2 20 (L) 03/13/2019 1037   CO2 26 07/13/2013 1542   GLUCOSE 105 (H) 03/13/2019 1037   GLUCOSE 106 (H) 07/13/2013 1542   BUN 11 03/13/2019 1037   BUN  11 07/13/2013 1542   CREATININE 0.98 03/13/2019 1037   CREATININE 0.76 10/15/2017 1001   CALCIUM 9.6 03/13/2019 1037   CALCIUM 9.9 07/13/2013 1542   PROT 6.2 (L) 03/13/2019 1037   PROT 8.1 07/13/2013 1542   ALBUMIN 4.1 03/13/2019 1037   ALBUMIN 4.3 07/13/2013 1542   AST 18 03/13/2019 1037   AST 26 07/13/2013 1542   ALT 12 03/13/2019 1037   ALT 32 07/13/2013 1542   ALKPHOS 35 (L) 03/13/2019 1037   ALKPHOS 74 07/13/2013 1542   BILITOT 0.4 03/13/2019 1037   BILITOT 0.4 07/13/2013 1542   GFRNONAA >60 03/13/2019 1037   GFRNONAA 95 10/15/2017 1001   GFRAA >60 03/13/2019 1037   GFRAA 110 10/15/2017 1001    No results found for: SPEP, UPEP  Lab Results  Component Value Date   WBC 6.6 03/13/2019   NEUTROABS 5.0 03/13/2019   HGB 10.9 (L) 03/13/2019   HCT 33.8 (L) 03/13/2019   MCV 96.6 03/13/2019   PLT 228 03/13/2019      Chemistry      Component Value Date/Time   NA 136 03/13/2019 1037   NA 140 07/13/2013 1542   K 4.4 03/13/2019 1037   K 4.3 07/13/2013 1542   CL 101 03/13/2019 1037   CL 107 07/13/2013 1542   CO2 20 (L) 03/13/2019 1037   CO2 26 07/13/2013 1542   BUN 11 03/13/2019 1037   BUN 11 07/13/2013 1542   CREATININE 0.98 03/13/2019 1037   CREATININE 0.76 10/15/2017 1001      Component Value Date/Time   CALCIUM 9.6 03/13/2019 1037   CALCIUM 9.9 07/13/2013 1542   ALKPHOS 35 (L) 03/13/2019 1037   ALKPHOS 74 07/13/2013 1542   AST 18 03/13/2019 1037   AST 26 07/13/2013 1542   ALT 12 03/13/2019 1037   ALT 32 07/13/2013 1542   BILITOT 0.4 03/13/2019 1037   BILITOT 0.4 07/13/2013 1542     RADIOGRAPHIC STUDIES: I have personally reviewed the radiological images as listed and agreed with the findings in the report. No  results found.   ASSESSMENT & PLAN:  Prostate cancer metastatic to bone ([place]) # Castrate resistant prostate cancer metastases to bone. Lupron every 3 months [due 04/03/2019]. Sep 2020; PSA- 190; rising. PET aux- 10/15- Progressive bone/ Left suprahilar-mediastinal LN. Currently, on Taxotere weekly. Clinically, STABLE;  PSA- 78; improving.   # proceed with chemotherapy Taxotere #13; Labs today reviewed;  acceptable for treatment today.   #Nausea without vomiting with worsening headaches; gait instability- NEG brain MRI. Sec to chemo- imporved.   # Rash on hands-likely secondary chemotherapy- STABLE.   # Malignancy related pain- continue fentanyl patch hydrocodone;. Continue fentanyl patch to every 48 hours and continue oxycodone prn;  # DISPOSITION:  # Taxotere chemo today # 2 week- MD; Reva Bores- cbc/cmp 1 day prior in [place];] Taxoetere-Dr.B    No orders of the defined types were placed in this encounter.  All questions were answered. The patient knows to call the clinic with any problems, questions or concerns.      [name], MD 03/17/2019 1:00 PM

## 2019-03-14 NOTE — Assessment & Plan Note (Addendum)
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 Lupron every 3 months [due 04/03/2019]. Sep 2020; PSA- 190; rising. PET aux- 10/15- Progressive bone/ Left suprahilar-mediastinal LN. Currently, on Taxotere weekly. Clinically, STABLE;  PSA- 78; improving.   # proceed with chemotherapy Taxotere #13; Labs today reviewed;  acceptable for treatment today.   #Nausea without vomiting with worsening headaches; gait instability- NEG brain MRI. Sec to chemo- imporved.   # Rash on hands-likely secondary chemotherapy- STABLE.   # Malignancy related pain- continue fentanyl patch hydrocodone;. Continue fentanyl patch to every 48 hours and continue oxycodone prn;  # DISPOSITION:  # Taxotere chemo today # 2 week- MD; Reva Bores- cbc/cmp 1 day prior in [place];] Taxoetere-Dr.B

## 2019-03-14 NOTE — Telephone Encounter (Signed)
SW attempted to contact patient for palliative care visit. No answer, unable to leave a message.

## 2019-03-24 ENCOUNTER — Inpatient Hospital Stay (HOSPITAL_BASED_OUTPATIENT_CLINIC_OR_DEPARTMENT_OTHER): Payer: 59 | Admitting: Hospice and Palliative Medicine

## 2019-03-24 ENCOUNTER — Other Ambulatory Visit: Payer: Self-pay

## 2019-03-24 DIAGNOSIS — C61 Malignant neoplasm of prostate: Secondary | ICD-10-CM

## 2019-03-24 DIAGNOSIS — C7951 Secondary malignant neoplasm of bone: Secondary | ICD-10-CM

## 2019-03-24 DIAGNOSIS — Z515 Encounter for palliative care: Secondary | ICD-10-CM

## 2019-03-24 NOTE — Progress Notes (Signed)
I tried calling patient but was unable to reach him.  Will reschedule visit.

## 2019-03-27 ENCOUNTER — Other Ambulatory Visit: Payer: Self-pay

## 2019-03-27 ENCOUNTER — Inpatient Hospital Stay: Payer: 59 | Attending: Hematology and Oncology

## 2019-03-27 DIAGNOSIS — C61 Malignant neoplasm of prostate: Secondary | ICD-10-CM | POA: Diagnosis not present

## 2019-03-27 DIAGNOSIS — C7951 Secondary malignant neoplasm of bone: Secondary | ICD-10-CM | POA: Diagnosis not present

## 2019-03-27 LAB — COMPREHENSIVE METABOLIC PANEL
ALT: 17 U/L (ref 0–44)
AST: 16 U/L (ref 15–41)
Albumin: 3.5 g/dL (ref 3.5–5.0)
Alkaline Phosphatase: 37 U/L — ABNORMAL LOW (ref 38–126)
Anion gap: 6 (ref 5–15)
BUN: 13 mg/dL (ref 8–23)
CO2: 26 mmol/L (ref 22–32)
Calcium: 9 mg/dL (ref 8.9–10.3)
Chloride: 102 mmol/L (ref 98–111)
Creatinine, Ser: 0.77 mg/dL (ref 0.61–1.24)
GFR calc Af Amer: >60 mL/min (ref >60)
GFR calc non Af Amer: >60 mL/min (ref >60)
Glucose, Bld: 142 mg/dL — ABNORMAL HIGH (ref 70–99)
Potassium: 3.8 mmol/L (ref 3.5–5.1)
Sodium: 134 mmol/L — ABNORMAL LOW (ref 135–145)
Total Bilirubin: 0.4 mg/dL (ref 0.3–1.2)
Total Protein: 5.6 g/dL — ABNORMAL LOW (ref 6.5–8.1)

## 2019-03-27 LAB — CBC WITH DIFFERENTIAL/PLATELET
Abs Immature Granulocytes: 0.05 10*3/uL (ref 0.00–0.07)
Basophils Absolute: 0 10*3/uL (ref 0.0–0.1)
Basophils Relative: 0 %
Eosinophils Absolute: 0 10*3/uL (ref 0.0–0.5)
Eosinophils Relative: 0 %
HCT: 34.7 % — ABNORMAL LOW (ref 39.0–52.0)
Hemoglobin: 10.8 g/dL — ABNORMAL LOW (ref 13.0–17.0)
Immature Granulocytes: 1 %
Lymphocytes Relative: 17 %
Lymphs Abs: 1 10*3/uL (ref 0.7–4.0)
MCH: 30.5 pg (ref 26.0–34.0)
MCHC: 31.1 g/dL (ref 30.0–36.0)
MCV: 98 fL (ref 80.0–100.0)
Monocytes Absolute: 0.2 10*3/uL (ref 0.1–1.0)
Monocytes Relative: 4 %
Neutro Abs: 4.4 10*3/uL (ref 1.7–7.7)
Neutrophils Relative %: 78 %
Platelets: 199 10*3/uL (ref 150–400)
RBC: 3.54 MIL/uL — ABNORMAL LOW (ref 4.22–5.81)
RDW: 16.4 % — ABNORMAL HIGH (ref 11.5–15.5)
WBC: 5.7 10*3/uL (ref 4.0–10.5)
nRBC: 0 % (ref 0.0–0.2)

## 2019-03-27 LAB — PSA: Prostatic Specific Antigen: 62.85 ng/mL — ABNORMAL HIGH (ref 0.00–4.00)

## 2019-03-28 ENCOUNTER — Inpatient Hospital Stay (HOSPITAL_BASED_OUTPATIENT_CLINIC_OR_DEPARTMENT_OTHER): Payer: 59 | Admitting: Internal Medicine

## 2019-03-28 ENCOUNTER — Encounter: Payer: Self-pay | Admitting: Internal Medicine

## 2019-03-28 ENCOUNTER — Inpatient Hospital Stay: Payer: 59

## 2019-03-28 DIAGNOSIS — I25118 Atherosclerotic heart disease of native coronary artery with other forms of angina pectoris: Secondary | ICD-10-CM

## 2019-03-28 DIAGNOSIS — Z7189 Other specified counseling: Secondary | ICD-10-CM

## 2019-03-28 DIAGNOSIS — R21 Rash and other nonspecific skin eruption: Secondary | ICD-10-CM | POA: Diagnosis not present

## 2019-03-28 DIAGNOSIS — C61 Malignant neoplasm of prostate: Secondary | ICD-10-CM

## 2019-03-28 DIAGNOSIS — C7951 Secondary malignant neoplasm of bone: Secondary | ICD-10-CM

## 2019-03-28 DIAGNOSIS — F17210 Nicotine dependence, cigarettes, uncomplicated: Secondary | ICD-10-CM | POA: Diagnosis not present

## 2019-03-28 DIAGNOSIS — Z79899 Other long term (current) drug therapy: Secondary | ICD-10-CM | POA: Diagnosis not present

## 2019-03-28 DIAGNOSIS — I2510 Atherosclerotic heart disease of native coronary artery without angina pectoris: Secondary | ICD-10-CM | POA: Diagnosis not present

## 2019-03-28 DIAGNOSIS — R110 Nausea: Secondary | ICD-10-CM | POA: Diagnosis not present

## 2019-03-28 DIAGNOSIS — I10 Essential (primary) hypertension: Secondary | ICD-10-CM | POA: Diagnosis not present

## 2019-03-28 DIAGNOSIS — R519 Headache, unspecified: Secondary | ICD-10-CM | POA: Diagnosis not present

## 2019-03-28 MED ORDER — OXYCODONE HCL 15 MG PO TABS: 15.0000 mg | ORAL_TABLET | Freq: Four times a day (QID) | ORAL | 0 refills | Status: DC | PRN

## 2019-03-28 MED ORDER — PROMETHAZINE HCL 25 MG PO TABS: ORAL_TABLET | ORAL | 3 refills | Status: DC

## 2019-03-28 MED ORDER — SODIUM CHLORIDE 0.9 % IV SOLN
Freq: Once | INTRAVENOUS | Status: AC
  Filled 2019-03-28: qty 250

## 2019-03-28 MED ORDER — SODIUM CHLORIDE 0.9 % IV SOLN
36.0000 mg/m2 | Freq: Once | INTRAVENOUS | Status: AC
  Administered 2019-03-28: 70 mg via INTRAVENOUS
  Filled 2019-03-28: qty 7

## 2019-03-28 MED ORDER — DEXAMETHASONE SODIUM PHOSPHATE 10 MG/ML IJ SOLN
10.0000 mg | Freq: Once | INTRAMUSCULAR | Status: AC
  Administered 2019-03-28: 10 mg via INTRAVENOUS
  Filled 2019-03-28: qty 1

## 2019-03-28 NOTE — Assessment & Plan Note (Addendum)
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 Lupron every 3 months [due 04/03/2019]. Sep 2020; PSA- 190; rising. PET aux- 10/15- Progressive bone/ Left suprahilar-mediastinal LN. Currently, on Taxotere weekly. Clinically, stable. PSA- 68; improving.   # proceed with chemotherapy Taxotere #14; Labs today reviewed;  acceptable for treatment today.   #Nausea without vomiting -likely secondary chemotherapy.;  No prescription for Phenergan.  # Malignancy related pain- continue fentanyl patch hydrocodone;. Continue fentanyl patch to every 48 hours and continue oxycodone prn; STABLE. New scipts given oxycodone again...   # DISPOSITION:  # Taxotere chemo today # april13th/later in [place] MD; Reva Bores- cbc/cmp/PSA 1 day prior in [place];] Taxoetere-Dr.B

## 2019-03-28 NOTE — Progress Notes (Signed)
Robert Short OFFICE PROGRESS NOTE  Patient Care Team: Olin Hauser, [name], MD as PCP - Cardiology (Cardiology) Minna Merritts, [name], [name]  Cancer Staging No matching staging information was found for the patient.   Oncology History Overview Note  # 2011- PROSTATE CANCER [Gleason 3+4]; s/p Prostatectomy [ also involved bladder neck/ECP; Dr.Polaseck]; July 2014- Biochemical recurrence [PSA 14]- started on Zoladex [Dr.Pandit]; lost to follow up.  # JAN 2017- STAGE IV METASTATIC PROSTATE Cancer to Bone- Feb 13th, 2017-  Lupron q 70m[~end of feb]; PSA: 1021; Declined Chemo; April 2017 [xofigo x6; Dr.Crystal]; AUG 2017- Zytiga + Prednisone BID. Bone scan-Jan 2018- improved skeletal metastases.   # MAY 1st 2019- START X-tandi [stopped zytiga/PSA- 8.8/rising]  #August 23, 2018-stop Xtandi [intolerance-fatigue mental fogginess]  #Mid September 2020-apalutamide; stopped mid-October poor tolerance/progression  # 11/01/2018- -Taxotere weekly [consent]  # Mets to bone- start X-geva q 56M [May 30th]; hold dental issues/infections  # Smoker/ chronic pain/pain clinic   #[April 2018; [place]]- s/p stenting; on Plavix; --------------------------------------------------------------  DIAGNOSIS: '[ ]'  Castrate resistant prostate cancer  STAGE:   4    ;GOALS: Palliative  CURRENT/MOST RECENT THERAPY-Late Oct 2020- Taxotere weekly [consent]    Prostate cancer metastatic to bone ([place]  12/08/2014 Initial Diagnosis   [place]   11/01/2018 -  Chemotherapy   The patient had DOCEtaxel (TAXOTERE) 70 mg in sodium chloride 0.9 % 150 mL chemo infusion, 36 mg/m2 = 70 mg, Intravenous,  Once, 14 of 18 cycles Administration: 70 mg (11/08/2018), 70 mg (11/22/2018), 70 mg (12/13/2018), 70 mg (12/06/2018),  70 mg (12/31/2018), 70 mg (01/07/2019), 70 mg (01/29/2019), 70 mg (12/20/2018), 70 mg (02/06/2019), 70 mg (02/14/2019), 70 mg (02/28/2019), 70 mg (03/14/2019)  for chemotherapy treatment.     INTERVAL HISTORY:  Robert ALPER89y.o.  male pleasant patient above history of metastatic castrate resistant prostate cancer currently -on Taxotere weekly is here for follow-up.  Patient complains of continued back pain continued joint pains.  He continues by narcotics.  Pain is fairly stable.  Needs a refill.  Continues to have intermittent nausea for which he takes Phenergan.  Episode of sore throat that resolved by itself.  No difficulty swallowing or pain with swallowing.  Review of Systems  Constitutional: Positive for malaise/fatigue and weight loss. Negative for chills, diaphoresis and fever.  HENT: Negative for nosebleeds and sore throat.   Eyes: Negative for double vision.  Respiratory: Negative for cough, hemoptysis, sputum production and wheezing.   Cardiovascular: Negative for chest pain, palpitations, orthopnea and leg swelling.  Gastrointestinal: Positive for nausea. Negative for abdominal pain, blood in stool, diarrhea, heartburn, melena and vomiting.  Genitourinary: Negative for dysuria, frequency and urgency.  Musculoskeletal: Positive for back pain and joint pain.  Skin: Negative for itching.  Neurological: Negative for dizziness, tingling, focal weakness, weakness and headaches.  Endo/Heme/Allergies: Does not bruise/bleed easily.  Psychiatric/Behavioral: Negative for depression. The patient is not nervous/anxious and does not have insomnia.       PAST MEDICAL HISTORY :  Past Medical History:  Diagnosis Date  . Back pain 10/09/2012  . [place]   . CAD (coronary artery disease)    a. 08/2015 PCI: LM nl, LAD 20d, LCX nl, RCA 236mRPAV 95 (3.0x18 [employer] DES);  b. [facility] (2.75x8 [facility] [employer] [employer]  DES).  . Cancer associated pain   . Depression   .  History of echocardiogram    a. 08/2016 Echo: EF 50-55%.  Marland Kitchen History of kidney stones   . Hyperlipidemia   . Hypertension   . Joint pain   . Prostate cancer [facility])    a.  s/p prostatectomy ([facility]);  b. bone mets noted 04/2016.  . Right arm pain 01/10/2016  . Right foot pain 01/10/2016  . Right leg pain 01/10/2016  . Tobacco abuse     PAST SURGICAL HISTORY :   Past Surgical History:  Procedure Laterality Date  . CARDIAC CATHETERIZATION     [facility]  . CARDIAC CATHETERIZATION N/A 08/16/2015   Procedure: Left Heart Cath and Coronary Angiography;  Surgeon: Yolonda Kida, MD;  Location: [place] [facility];  Service: Cardiovascular;  Laterality: N/A;  . CARDIAC CATHETERIZATION N/A 08/16/2015   Procedure: Coronary Stent Intervention;  Surgeon: Yolonda Kida, MD;  Location: [place] [facility];  Service: Cardiovascular;  Laterality: N/A;  . CERVIX SURGERY    . PROSTATECTOMY    . SPINE SURGERY    . TONSILLECTOMY    . WRIST SURGERY      FAMILY HISTORY :   Family History  Problem Relation Age of Onset  . Heart attack Mother   . Hypertension Mother   . Heart attack Father     SOCIAL HISTORY:   Social History   Tobacco Use  . Smoking status: Current Every Day Smoker    Packs/day: 0.50    Years: 40.00    Pack years: 20.00    Types: Cigarettes  . Smokeless tobacco: Current User    Types: Chew  . Tobacco comment: 1 pack every couple days   Substance Use Topics  . Alcohol use: Yes    Comment: occasionally  . Drug use: No    ALLERGIES:  is allergic to ditropan [oxybutynin].  MEDICATIONS:  Current Outpatient Medications  Medication Sig Dispense Refill  . acetaminophen (TYLENOL) 500 MG tablet Take 1,000 mg by mouth every 8 (eight) hours as needed.     Marland Kitchen albuterol (PROVENTIL HFA;VENTOLIN HFA) 108 (90 Base) MCG/ACT inhaler Inhale 1-2 puffs into the lungs every 6 (six) hours as needed for wheezing or shortness of breath. 1 Inhaler 0  . aspirin 81 MG tablet Take 81 mg by mouth  daily.    Marland Kitchen atorvastatin (LIPITOR) 80 MG tablet Take 1 tablet (80 mg total) by mouth daily at 6 PM. 90 tablet 3  . chlorhexidine (PERIDEX) 0.12 % solution     . clopidogrel (PLAVIX) 75 MG tablet Take 1 tablet (75 mg total) by mouth daily. 90 tablet 3  . fentaNYL (DURAGESIC) 100 MCG/HR Place 1 patch onto the skin every other day. Along with 67mg patch for total of 125 mcg every 2 days. 15 patch 0  . fentaNYL (DURAGESIC) 25 MCG/HR Place 1 patch onto the skin every other day. Use this along with fentanyl patch 100 mcg [total 125 mcg] every 2 days 15 patch 0  . gabapentin (NEURONTIN) 100 MG capsule Take 1 capsule (100 mg total) by mouth 3 (three) times daily. 90 capsule 5  . magnesium hydroxide (MILK OF MAGNESIA) 400 MG/5ML suspension Take 5 mLs by mouth daily as needed for constipation.    . metoprolol tartrate (LOPRESSOR) 25 MG tablet TAKE 1/2 TABLET BY MOUTH 2 TIMES DAILY. 90 tablet 1  . naloxone (NARCAN) nasal spray 4 mg/0.1 mL 1 spray into nostril upon signs of opioid  overdose. Call 911. May repeat once if no response within 2-3 minutes. 1 kit 0  . nitroGLYCERIN (NITROSTAT) 0.4 MG SL tablet Place 1 tablet (0.4 mg total) under the tongue every 5 (five) minutes as needed. 25 tablet 3  . ondansetron (ZOFRAN) 8 MG tablet One pill every 8 hours as needed for nausea/vomitting. 40 tablet 1  . oxyCODONE (ROXICODONE) 15 MG immediate release tablet Take 1 tablet (15 mg total) by mouth every 6 (six) hours as needed. for pain 90 tablet 0  . polyethylene glycol (MIRALAX / GLYCOLAX) packet Take 17 g by mouth daily as needed for moderate constipation.    . predniSONE (DELTASONE) 5 MG tablet TAKE 1 TABLET BY MOUTH DAILY WITH BREAKFAST. (Patient taking differently: Take 2.5 mg by mouth daily with breakfast. ) 30 tablet 1  . predniSONE (DELTASONE) 5 MG tablet Take 1 tablet (5 mg total) by mouth daily with breakfast. 30 tablet 3  . promethazine (PHENERGAN) 25 MG tablet TAKE 1/2 TO 1 TABLET (12.5-25 MG TOTAL) BY MOUTH  EVERY 8 (EIGHT) HOURS AS NEEDED FOR NAUSEA OR VOMITING. 60 tablet 3  . traZODone (DESYREL) 50 MG tablet Take 1-2 tablets (50-100 mg total) by mouth at bedtime as needed for sleep. 60 tablet 2  . triamcinolone ointment (KENALOG) 0.5 % Apply 1 application topically 2 (two) times daily. 30 g 0  . Wheat Dextrin (BENEFIBER) POWD Stir 2 tsp. TID into 4-8 oz of any non-carbonated beverage or soft food (hot or cold) 500 g PRN   No current facility-administered medications for this visit.   Facility-Administered Medications Ordered in Other Visits  Medication Dose Route Frequency Provider Last Rate Last Admin  . DOCEtaxel (TAXOTERE) 70 mg in sodium chloride 0.9 % 150 mL chemo infusion  36 mg/m2 (Treatment Plan Recorded) Intravenous Once [name], MD        PHYSICAL EXAMINATION: ECOG PERFORMANCE STATUS: 1 - Symptomatic but completely ambulatory  BP (!) 153/96   Pulse 80   Temp (!) 96.6 F (35.9 C)   Wt 159 lb (72.1 kg)   BMI 22.18 kg/m   Filed Weights   03/28/19 1328  Weight: 159 lb (72.1 kg)    Physical Exam  Constitutional: He is oriented to person, place, and time.   Frail-appearing Caucasian male patient.  He is in a walking with a cane.   Accompanied by his wife.  HENT:  Head: Normocephalic and atraumatic.  Mouth/Throat: Oropharynx is clear and moist. No oropharyngeal exudate.  Poor dentition.  Eyes: Pupils are equal, round, and reactive to light.  Cardiovascular: Normal rate and regular rhythm.  Pulmonary/Chest: No respiratory distress. He has no wheezes.  Decreased breath sounds bilaterally at the bases.  No wheeze or crackles  Abdominal: Soft. Bowel sounds are normal. He exhibits no distension and no mass. There is no abdominal tenderness. There is no rebound and no guarding.  Musculoskeletal:        General: No tenderness or edema. Normal range of motion.     Cervical back: Normal range of motion and neck supple.  Neurological: He is alert and oriented to  person, place, and time.  Skin: Skin is warm.  Macular rash on hands  Psychiatric: Affect normal.    LABORATORY DATA:  I have reviewed the data as listed    Component Value Date/Time   NA 134 (L) 03/27/2019 1318   NA 140 07/13/2013 1542   K 3.8 03/27/2019 1318   K 4.3 07/13/2013 1542   CL 102  03/27/2019 1318   CL 107 07/13/2013 1542   CO2 26 03/27/2019 1318   CO2 26 07/13/2013 1542   GLUCOSE 142 (H) 03/27/2019 1318   GLUCOSE 106 (H) 07/13/2013 1542   BUN 13 03/27/2019 1318   BUN 11 07/13/2013 1542   CREATININE 0.77 03/27/2019 1318   CREATININE 0.76 10/15/2017 1001   CALCIUM 9.0 03/27/2019 1318   CALCIUM 9.9 07/13/2013 1542   PROT 5.6 (L) 03/27/2019 1318   PROT 8.1 07/13/2013 1542   ALBUMIN 3.5 03/27/2019 1318   ALBUMIN 4.3 07/13/2013 1542   AST 16 03/27/2019 1318   AST 26 07/13/2013 1542   ALT 17 03/27/2019 1318   ALT 32 07/13/2013 1542   ALKPHOS 37 (L) 03/27/2019 1318   ALKPHOS 74 07/13/2013 1542   BILITOT 0.4 03/27/2019 1318   BILITOT 0.4 07/13/2013 1542   GFRNONAA >60 03/27/2019 1318   GFRNONAA 95 10/15/2017 1001   GFRAA >60 03/27/2019 1318   GFRAA 110 10/15/2017 1001    No results found for: SPEP, UPEP  Lab Results  Component Value Date   WBC 5.7 03/27/2019   NEUTROABS 4.4 03/27/2019   HGB 10.8 (L) 03/27/2019   HCT 34.7 (L) 03/27/2019   MCV 98.0 03/27/2019   PLT 199 03/27/2019      Chemistry      Component Value Date/Time   NA 134 (L) 03/27/2019 1318   NA 140 07/13/2013 1542   K 3.8 03/27/2019 1318   K 4.3 07/13/2013 1542   CL 102 03/27/2019 1318   CL 107 07/13/2013 1542   CO2 26 03/27/2019 1318   CO2 26 07/13/2013 1542   BUN 13 03/27/2019 1318   BUN 11 07/13/2013 1542   CREATININE 0.77 03/27/2019 1318   CREATININE 0.76 10/15/2017 1001      Component Value Date/Time   CALCIUM 9.0 03/27/2019 1318   CALCIUM 9.9 07/13/2013 1542   ALKPHOS 37 (L) 03/27/2019 1318   ALKPHOS 74 07/13/2013 1542   AST 16 03/27/2019 1318   AST 26 07/13/2013 1542    ALT 17 03/27/2019 1318   ALT 32 07/13/2013 1542   BILITOT 0.4 03/27/2019 1318   BILITOT 0.4 07/13/2013 1542     RADIOGRAPHIC STUDIES: I have personally reviewed the radiological images as listed and agreed with the findings in the report. No results found.   ASSESSMENT & PLAN:  Prostate cancer metastatic to bone ([place]) # Castrate resistant prostate cancer metastases to bone. Lupron every 3 months [due 04/03/2019]. Sep 2020; PSA- 190; rising. PET aux- 10/15- Progressive bone/ Left suprahilar-mediastinal LN. Currently, on Taxotere weekly. Clinically, stable. PSA- 68; improving.   # proceed with chemotherapy Taxotere #14; Labs today reviewed;  acceptable for treatment today.   #Nausea without vomiting -likely secondary chemotherapy.;  No prescription for Phenergan.  # Malignancy related pain- continue fentanyl patch hydrocodone;. Continue fentanyl patch to every 48 hours and continue oxycodone prn; STABLE. New scipts given oxycodone again...   # DISPOSITION:  # Taxotere chemo today # april13th/later in AM MD; Reva Bores- cbc/cmp/PSA 1 day prior in [place];] Taxoetere-Dr.B    No orders of the defined types were placed in this encounter.  All questions were answered. The patient knows to call the clinic with any problems, questions or concerns.      [name], MD 03/28/2019 2:30 PM

## 2019-04-06 ENCOUNTER — Other Ambulatory Visit: Payer: Self-pay | Admitting: Internal Medicine

## 2019-04-07 ENCOUNTER — Other Ambulatory Visit: Payer: Self-pay | Admitting: *Deleted

## 2019-04-07 DIAGNOSIS — C61 Malignant neoplasm of prostate: Secondary | ICD-10-CM

## 2019-04-07 DIAGNOSIS — C7951 Secondary malignant neoplasm of bone: Secondary | ICD-10-CM

## 2019-04-07 DIAGNOSIS — G893 Neoplasm related pain (acute) (chronic): Secondary | ICD-10-CM

## 2019-04-07 MED ORDER — FENTANYL 25 MCG/HR TD PT72: 1.0000 | MEDICATED_PATCH | TRANSDERMAL | 0 refills | Status: DC

## 2019-04-07 MED ORDER — FENTANYL 100 MCG/HR TD PT72: 1.0000 | MEDICATED_PATCH | TRANSDERMAL | 0 refills | Status: DC

## 2019-04-07 NOTE — Telephone Encounter (Signed)
-----   Message from Secundino Ginger sent at 04/07/2019  2:23 PM EDT ----- Regarding: refill Contact: [phone] Robert Short needs the duragesic patches refilled the [address] [facility] employee pharmacy.

## 2019-04-08 NOTE — Progress Notes (Signed)
Pharmacist Chemotherapy Monitoring - Follow Up Assessment    I verify that I have reviewed each item in the below checklist:  . Regimen for the patient is scheduled for the appropriate day and plan matches scheduled date. Marland Kitchen Appropriate non-routine labs are ordered dependent on drug ordered. . If applicable, additional medications reviewed and ordered per protocol based on lifetime cumulative doses and/or treatment regimen.   Plan for follow-up and/or issues identified: No . I-vent associated with next due treatment: No . MD and/or nursing notified: No  Suri Tafolla K 04/08/2019 9:09 AM

## 2019-04-14 ENCOUNTER — Telehealth: Payer: Self-pay

## 2019-04-14 ENCOUNTER — Inpatient Hospital Stay: Payer: 59 | Attending: Hematology and Oncology

## 2019-04-14 ENCOUNTER — Inpatient Hospital Stay: Payer: 59

## 2019-04-14 ENCOUNTER — Other Ambulatory Visit: Payer: Self-pay

## 2019-04-14 DIAGNOSIS — R110 Nausea: Secondary | ICD-10-CM | POA: Diagnosis not present

## 2019-04-14 DIAGNOSIS — Z79899 Other long term (current) drug therapy: Secondary | ICD-10-CM | POA: Diagnosis not present

## 2019-04-14 DIAGNOSIS — C61 Malignant neoplasm of prostate: Secondary | ICD-10-CM | POA: Insufficient documentation

## 2019-04-14 DIAGNOSIS — C7951 Secondary malignant neoplasm of bone: Secondary | ICD-10-CM | POA: Insufficient documentation

## 2019-04-14 LAB — COMPREHENSIVE METABOLIC PANEL
ALT: 21 U/L (ref 0–44)
AST: 19 U/L (ref 15–41)
Albumin: 3.7 g/dL (ref 3.5–5.0)
Alkaline Phosphatase: 32 U/L — ABNORMAL LOW (ref 38–126)
Anion gap: 8 (ref 5–15)
BUN: 17 mg/dL (ref 8–23)
CO2: 25 mmol/L (ref 22–32)
Calcium: 8.9 mg/dL (ref 8.9–10.3)
Chloride: 104 mmol/L (ref 98–111)
Creatinine, Ser: 0.84 mg/dL (ref 0.61–1.24)
GFR calc Af Amer: >60 mL/min (ref >60)
GFR calc non Af Amer: >60 mL/min (ref >60)
Glucose, Bld: 107 mg/dL — ABNORMAL HIGH (ref 70–99)
Potassium: 3.4 mmol/L — ABNORMAL LOW (ref 3.5–5.1)
Sodium: 137 mmol/L (ref 135–145)
Total Bilirubin: 0.4 mg/dL (ref 0.3–1.2)
Total Protein: 5.7 g/dL — ABNORMAL LOW (ref 6.5–8.1)

## 2019-04-14 LAB — CBC WITH DIFFERENTIAL/PLATELET
Abs Immature Granulocytes: 0.12 10*3/uL — ABNORMAL HIGH (ref 0.00–0.07)
Basophils Absolute: 0 10*3/uL (ref 0.0–0.1)
Basophils Relative: 0 %
Eosinophils Absolute: 0 10*3/uL (ref 0.0–0.5)
Eosinophils Relative: 0 %
HCT: 32.3 % — ABNORMAL LOW (ref 39.0–52.0)
Hemoglobin: 10.4 g/dL — ABNORMAL LOW (ref 13.0–17.0)
Immature Granulocytes: 2 %
Lymphocytes Relative: 24 %
Lymphs Abs: 1.9 10*3/uL (ref 0.7–4.0)
MCH: 31.1 pg (ref 26.0–34.0)
MCHC: 32.2 g/dL (ref 30.0–36.0)
MCV: 96.7 fL (ref 80.0–100.0)
Monocytes Absolute: 1.1 10*3/uL — ABNORMAL HIGH (ref 0.1–1.0)
Monocytes Relative: 14 %
Neutro Abs: 4.7 10*3/uL (ref 1.7–7.7)
Neutrophils Relative %: 60 %
Platelets: 222 10*3/uL (ref 150–400)
RBC: 3.34 MIL/uL — ABNORMAL LOW (ref 4.22–5.81)
RDW: 16.8 % — ABNORMAL HIGH (ref 11.5–15.5)
WBC: 7.7 10*3/uL (ref 4.0–10.5)
nRBC: 0 % (ref 0.0–0.2)

## 2019-04-14 NOTE — Telephone Encounter (Signed)
Telephone call to patient to discuss palliative care services.  Patient reports he is not interested in receiving palliative care home visits.  Patient states he would like to continue to see Robert Short at the cancer center.  RN called and left message for Robert Short informing him patient declined palliative care home visits.

## 2019-04-15 ENCOUNTER — Inpatient Hospital Stay (HOSPITAL_BASED_OUTPATIENT_CLINIC_OR_DEPARTMENT_OTHER): Payer: 59 | Admitting: Internal Medicine

## 2019-04-15 ENCOUNTER — Inpatient Hospital Stay: Payer: 59

## 2019-04-15 ENCOUNTER — Encounter: Payer: Self-pay | Admitting: Internal Medicine

## 2019-04-15 VITALS — BP 136/69 | HR 63 | Temp 96.5°F | Resp 18

## 2019-04-15 DIAGNOSIS — C61 Malignant neoplasm of prostate: Secondary | ICD-10-CM

## 2019-04-15 DIAGNOSIS — Z79899 Other long term (current) drug therapy: Secondary | ICD-10-CM | POA: Diagnosis not present

## 2019-04-15 DIAGNOSIS — R110 Nausea: Secondary | ICD-10-CM | POA: Diagnosis not present

## 2019-04-15 DIAGNOSIS — C7951 Secondary malignant neoplasm of bone: Secondary | ICD-10-CM

## 2019-04-15 DIAGNOSIS — I25118 Atherosclerotic heart disease of native coronary artery with other forms of angina pectoris: Secondary | ICD-10-CM

## 2019-04-15 DIAGNOSIS — Z7189 Other specified counseling: Secondary | ICD-10-CM

## 2019-04-15 LAB — PSA: Prostatic Specific Antigen: 67.17 ng/mL — ABNORMAL HIGH (ref 0.00–4.00)

## 2019-04-15 MED ORDER — DEXAMETHASONE SODIUM PHOSPHATE 10 MG/ML IJ SOLN
10.0000 mg | Freq: Once | INTRAMUSCULAR | Status: AC
  Administered 2019-04-15: 10 mg via INTRAVENOUS
  Filled 2019-04-15: qty 1

## 2019-04-15 MED ORDER — SODIUM CHLORIDE 0.9 % IV SOLN
Freq: Once | INTRAVENOUS | Status: AC
  Filled 2019-04-15: qty 250

## 2019-04-15 MED ORDER — SODIUM CHLORIDE 0.9 % IV SOLN
36.0000 mg/m2 | Freq: Once | INTRAVENOUS | Status: AC
  Administered 2019-04-15: 70 mg via INTRAVENOUS
  Filled 2019-04-15: qty 7

## 2019-04-15 MED ORDER — OXYCODONE HCL 15 MG PO TABS: 15.0000 mg | ORAL_TABLET | Freq: Four times a day (QID) | ORAL | 0 refills | Status: DC | PRN

## 2019-04-15 NOTE — Progress Notes (Signed)
[facility] OFFICE PROGRESS NOTE  Patient Care Team: Olin Hauser, [name], MD as PCP - Cardiology (Cardiology) Minna Merritts, [name], [name]  Cancer Staging No matching staging information was found for the patient.   Oncology History Overview Note  # 2011- PROSTATE CANCER [Gleason 3+4]; s/p Prostatectomy [ also involved bladder neck/ECP; Dr.Polaseck]; July 2014- Biochemical recurrence [PSA 14]- started on Zoladex [Dr.Pandit]; lost to follow up.  # JAN 2017- STAGE IV METASTATIC PROSTATE Cancer to Bone- Feb 13th, 2017-  Lupron q 52m[~end of feb]; PSA: 1021; Declined Chemo; April 2017 [xofigo x6; Dr.Crystal]; AUG 2017- Zytiga + Prednisone BID. Bone scan-Jan 2018- improved skeletal metastases.   # MAY 1st 2019- START X-tandi [stopped zytiga/PSA- 8.8/rising]  #August 23, 2018-stop Xtandi [intolerance-fatigue mental fogginess]  #Mid September 2020-apalutamide; stopped mid-October poor tolerance/progression  # 11/01/2018- -Taxotere weekly [consent]  # Mets to bone- start X-geva q 61M [May 30th]; hold dental issues/infections  # Smoker/ chronic pain/pain clinic   #[April 2018; [place]]- s/p stenting; on Plavix; --------------------------------------------------------------  DIAGNOSIS: _0  Castrate resistant prostate cancer  STAGE:   4    ;GOALS: Palliative  CURRENT/MOST RECENT THERAPY-Late Oct 2020- Taxotere weekly [consent]    [place]  12/[place]/2014 Initial Diagnosis   [place] bone ([facility]   11/01/2018 -  Chemotherapy   The patient had DOCEtaxel (TAXOTERE) 70 mg in sodium chloride 0.9 % 150 mL chemo infusion, 36 mg/m2 = 70 mg, Intravenous,  Once, 15 of 18 cycles Administration: 70 mg (11/08/2018), 70 mg (11/22/2018), 70 mg (12/13/2018), 70 mg (12/06/2018),  70 mg (12/31/2018), 70 mg (01/07/2019), 70 mg (01/29/2019), 70 mg (12/20/2018), 70 mg (02/06/2019), 70 mg (02/14/2019), 70 mg (02/28/2019), 70 mg (03/14/2019), 70 mg (03/28/2019), 70 mg (04/15/2019)  for chemotherapy treatment.     INTERVAL HISTORY:  SRONNEY HONEYWELL89y.o.  male pleasant patient above history of metastatic castrate resistant prostate cancer currently -on Taxotere weekly is here for follow-up.  Patient denies any worsening nausea vomiting.  Continues have intermittent nausea for which he takes Phenergan.  Continues to have back pain joint pains.  Not any worse.  He continues with narcotic pain medication.  Mild tingling and numbness in extremities.  No swelling.  Review of Systems  Constitutional: Positive for malaise/fatigue and weight loss. Negative for chills, diaphoresis and fever.  HENT: Negative for nosebleeds and sore throat.   Eyes: Negative for double vision.  Respiratory: Negative for cough, hemoptysis, sputum production and wheezing.   Cardiovascular: Negative for chest pain, palpitations, orthopnea and leg swelling.  Gastrointestinal: Positive for nausea. Negative for abdominal pain, blood in stool, diarrhea, heartburn, melena and vomiting.  Genitourinary: Negative for dysuria, frequency and urgency.  Musculoskeletal: Positive for back pain and joint pain.  Skin: Negative for itching.  Neurological: Negative for dizziness, tingling, focal weakness, weakness and headaches.  Endo/Heme/Allergies: Does not bruise/bleed easily.  Psychiatric/Behavioral: Negative for depression. The patient is not nervous/anxious and does not have insomnia.       PAST MEDICAL HISTORY :  Past Medical History:  Diagnosis Date  . Back pain 10/09/2012  . [place] cancer [place]   . CAD (coronary artery disease)    a. 08/2015 PCI: LM nl, LAD 20d, LCX nl, RCA 259mRPAV 95 (3.0x18 [employer] DES);  b. [facility]2.75x8 [facility] [employer] [employer]  DES).  . Cancer associated pain   . Depression    . History of echocardiogram    a. 08/2016 Echo: EF 50-55%.  Marland Kitchen History of kidney stones   . Hyperlipidemia   . Hypertension   . Joint pain   . Prostate cancer [facility])    a.  s/p prostatectomy ([facility]);  b. bone mets noted 04/2016.  . Right arm pain 01/10/2016  . Right foot pain 01/10/2016  . Right leg pain 01/10/2016  . Tobacco abuse     PAST SURGICAL HISTORY :   Past Surgical History:  Procedure Laterality Date  . CARDIAC CATHETERIZATION     [facility]  . CARDIAC CATHETERIZATION N/A 08/16/2015   Procedure: Left Heart Cath and Coronary Angiography;  Surgeon: Yolonda Kida, MD;  Location: [facility] CV LAB;  Service: Cardiovascular;  Laterality: N/A;  . CARDIAC CATHETERIZATION N/A 08/16/2015   Procedure: Coronary Stent Intervention;  Surgeon: Yolonda Kida, MD;  Location: [facility] LAB;  Service: Cardiovascular;  Laterality: N/A;  . CERVIX SURGERY    . PROSTATECTOMY    . SPINE SURGERY    . TONSILLECTOMY    . WRIST SURGERY      FAMILY HISTORY :   Family History  Problem Relation Age of Onset  . Heart attack Mother   . Hypertension Mother   . Heart attack Father     SOCIAL HISTORY:   Social History   Tobacco Use  . Smoking status: Current Every Day Smoker    Packs/day: 0.50    Years: 40.00    Pack years: 20.00    Types: Cigarettes  . Smokeless tobacco: Current User    Types: Chew  . Tobacco comment: 1 pack every couple days   Substance Use Topics  . Alcohol use: Yes    Comment: occasionally  . Drug use: No    ALLERGIES:  is allergic to ditropan [oxybutynin].  MEDICATIONS:  Current Outpatient Medications  Medication Sig Dispense Refill  . acetaminophen (TYLENOL) 500 MG tablet Take 1,000 mg by mouth every 8 (eight) hours as needed.     Marland Kitchen albuterol (PROVENTIL HFA;VENTOLIN HFA) 108 (90 Base) MCG/ACT inhaler Inhale 1-2 puffs into the lungs every 6 (six) hours as needed for wheezing or shortness of breath. 1 Inhaler 0  . aspirin 81 MG tablet Take 81 mg by  mouth daily.    Marland Kitchen atorvastatin (LIPITOR) 80 MG tablet Take 1 tablet (80 mg total) by mouth daily at 6 PM. 90 tablet 3  . chlorhexidine (PERIDEX) 0.12 % solution     . clopidogrel (PLAVIX) 75 MG tablet Take 1 tablet (75 mg total) by mouth daily. 90 tablet 3  . fentaNYL (DURAGESIC) 100 MCG/HR Place 1 patch onto the skin every other day. Along with 7mg patch for total of 125 mcg every 2 days. 15 patch 0  . fentaNYL (DURAGESIC) 25 MCG/HR Place 1 patch onto the skin every other day. Use this along with fentanyl patch 100 mcg [total 125 mcg] every 2 days 15 patch 0  . gabapentin (NEURONTIN) 100 MG capsule Take 1 capsule (100 mg total) by mouth 3 (three) times daily. 90 capsule 5  . magnesium hydroxide (MILK OF MAGNESIA) 400 MG/5ML suspension Take 5 mLs by mouth daily as needed for constipation.    . metoprolol tartrate (LOPRESSOR) 25 MG tablet TAKE 1/2 TABLET BY MOUTH 2 TIMES DAILY. 90 tablet 1  . naloxone (NARCAN) nasal spray 4 mg/0.1 mL 1 spray into nostril upon signs of opioid  overdose. Call 911. May repeat once if no response within 2-3 minutes. 1 kit 0  . nitroGLYCERIN (NITROSTAT) 0.4 MG SL tablet Place 1 tablet (0.4 mg total) under the tongue every 5 (five) minutes as needed. 25 tablet 3  . ondansetron (ZOFRAN) 8 MG tablet One pill every 8 hours as needed for nausea/vomitting. 40 tablet 1  . oxyCODONE (ROXICODONE) 15 MG immediate release tablet Take 1 tablet (15 mg total) by mouth every 6 (six) hours as needed. for pain 90 tablet 0  . polyethylene glycol (MIRALAX / GLYCOLAX) packet Take 17 g by mouth daily as needed for moderate constipation.    . predniSONE (DELTASONE) 5 MG tablet TAKE 1 TABLET BY MOUTH DAILY WITH BREAKFAST. (Patient taking differently: Take 2.5 mg by mouth daily with breakfast. ) 30 tablet 1  . predniSONE (DELTASONE) 5 MG tablet Take 1 tablet (5 mg total) by mouth daily with breakfast. 30 tablet 3  . promethazine (PHENERGAN) 25 MG tablet TAKE 1/2 TO 1 TABLET (12.5-25 MG TOTAL) BY  MOUTH EVERY 8 (EIGHT) HOURS AS NEEDED FOR NAUSEA OR VOMITING. 60 tablet 3  . traZODone (DESYREL) 50 MG tablet Take 1-2 tablets (50-100 mg total) by mouth at bedtime as needed for sleep. 60 tablet 2  . triamcinolone ointment (KENALOG) 0.5 % Apply 1 application topically 2 (two) times daily. 30 g 0  . Wheat Dextrin (BENEFIBER) POWD Stir 2 tsp. TID into 4-8 oz of any non-carbonated beverage or soft food (hot or cold) 500 g PRN   No current facility-administered medications for this visit.    PHYSICAL EXAMINATION: ECOG PERFORMANCE STATUS: 1 - Symptomatic but completely ambulatory  BP (!) 143/82 (BP Location: Left Arm, Patient Position: Sitting, Cuff Size: Normal)   Pulse 74   Temp (!) 97 F (36.1 C) (Tympanic)   Resp 20   Wt 160 lb (72.6 kg)   BMI 22.32 kg/m   Filed Weights   04/15/19 1304  Weight: 160 lb (72.6 kg)    Physical Exam  Constitutional: He is oriented to person, place, and time.   Frail-appearing Caucasian male patient.  He is in a walking with a cane.  Alone.  HENT:  Head: Normocephalic and atraumatic.  Mouth/Throat: Oropharynx is clear and moist. No oropharyngeal exudate.  Poor dentition.  Eyes: Pupils are equal, round, and reactive to light.  Cardiovascular: Normal rate and regular rhythm.  Pulmonary/Chest: No respiratory distress. He has no wheezes.  Decreased breath sounds bilaterally at the bases.  No wheeze or crackles  Abdominal: Soft. Bowel sounds are normal. He exhibits no distension and no mass. There is no abdominal tenderness. There is no rebound and no guarding.  Musculoskeletal:        General: No tenderness or edema. Normal range of motion.     Cervical back: Normal range of motion and neck supple.  Neurological: He is alert and oriented to person, place, and time.  Skin: Skin is warm.  Macular rash on hands  Psychiatric: Affect normal.    LABORATORY DATA:  I have reviewed the data as listed    Component Value Date/Time   NA 137 04/14/2019  1331   NA 140 07/13/2013 1542   K 3.4 (L) 04/14/2019 1331   K 4.3 07/13/2013 1542   CL 104 04/14/2019 1331   CL 107 07/13/2013 1542   CO2 25 04/14/2019 1331   CO2 26 07/13/2013 1542   GLUCOSE 107 (H) 04/14/2019 1331   GLUCOSE 106 (H) 07/13/2013 1542   BUN 17  04/14/2019 1331   BUN 11 07/13/2013 1542   CREATININE 0.84 04/14/2019 1331   CREATININE 0.76 10/15/2017 1001   CALCIUM 8.9 04/14/2019 1331   CALCIUM 9.9 07/13/2013 1542   PROT 5.7 (L) 04/14/2019 1331   PROT 8.1 07/13/2013 1542   ALBUMIN 3.7 04/14/2019 1331   ALBUMIN 4.3 07/13/2013 1542   AST 19 04/14/2019 1331   AST 26 07/13/2013 1542   ALT 21 04/14/2019 1331   ALT 32 07/13/2013 1542   ALKPHOS 32 (L) 04/14/2019 1331   ALKPHOS 74 07/13/2013 1542   BILITOT 0.4 04/14/2019 1331   BILITOT 0.4 07/13/2013 1542   GFRNONAA >60 04/14/2019 1331   GFRNONAA 95 10/15/2017 1001   GFRAA >60 04/14/2019 1331   GFRAA 110 10/15/2017 1001    No results found for: SPEP, UPEP  Lab Results  Component Value Date   WBC 7.7 04/14/2019   NEUTROABS 4.7 04/14/2019   HGB 10.4 (L) 04/14/2019   HCT 32.3 (L) 04/14/2019   MCV 96.7 04/14/2019   PLT 222 04/14/2019      Chemistry      Component Value Date/Time   NA 137 04/14/2019 1331   NA 140 07/13/2013 1542   K 3.4 (L) 04/14/2019 1331   K 4.3 07/13/2013 1542   CL 104 04/14/2019 1331   CL 107 07/13/2013 1542   CO2 25 04/14/2019 1331   CO2 26 07/13/2013 1542   BUN 17 04/14/2019 1331   BUN 11 07/13/2013 1542   CREATININE 0.84 04/14/2019 1331   CREATININE 0.76 10/15/2017 1001      Component Value Date/Time   CALCIUM 8.9 04/14/2019 1331   CALCIUM 9.9 07/13/2013 1542   ALKPHOS 32 (L) 04/14/2019 1331   ALKPHOS 74 07/13/2013 1542   AST 19 04/14/2019 1331   AST 26 07/13/2013 1542   ALT 21 04/14/2019 1331   ALT 32 07/13/2013 1542   BILITOT 0.4 04/14/2019 1331   BILITOT 0.4 07/13/2013 1542     RADIOGRAPHIC STUDIES: I have personally reviewed the radiological images as listed and  agreed with the findings in the report. No results found.   ASSESSMENT & PLAN:  Prostate cancer metastatic to bone ([place]) # Castrate resistant prostate cancer metastases to bone. Lupron every 3 months [due 04/03/2019]. Sep 2020; PSA- 190; rising. PET aux- 10/15- Progressive bone/ Left suprahilar-mediastinal LN. Currently, on Taxotere every 2 week. Clinically, stable. PSA-67.   # proceed with chemotherapy Taxotere #15 every 2 weeks Labs today reviewed;  acceptable for treatment today.   #Nausea without vomiting -stable prescription for Phenergan.  # Malignancy related pain- continue fentanyl patch hydrocodone;. Continue fentanyl patch to every 48 hours and continue oxycodone prn; stable.   # DISPOSITION:  # Taxotere chemo today # in 2 weeks- MD;  Reva Bores- cbc/cmp/PSA 1 day prior in [place];] Taxoetere-Dr.B    No orders of the defined types were placed in this encounter.  All questions were answered. The patient knows to call the clinic with any problems, questions or concerns.      [name], MD 04/16/2019 8:06 AM

## 2019-04-15 NOTE — Assessment & Plan Note (Addendum)
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 Lupron every 3 months [due 04/03/2019]. Sep 2020; PSA- 190; rising. PET aux- 10/15- Progressive bone/ Left suprahilar-mediastinal LN. Currently, on Taxotere every 2 week. Clinically, stable. PSA-67.   # proceed with chemotherapy Taxotere #15 every 2 weeks Labs today reviewed;  acceptable for treatment today.   #Nausea without vomiting -stable prescription for Phenergan.  # Malignancy related pain- continue fentanyl patch hydrocodone;. Continue fentanyl patch to every 48 hours and continue oxycodone prn; stable.   # DISPOSITION:  # Taxotere chemo today # in 2 weeks- MD;  Reva Bores- cbc/cmp/PSA 1 day prior in [place];] Taxoetere-Dr.B

## 2019-04-22 NOTE — Progress Notes (Signed)
Pharmacist Chemotherapy Monitoring - Follow Up Assessment    I verify that I have reviewed each item in the below checklist:  . Regimen for the patient is scheduled for the appropriate day and plan matches scheduled date. Marland Kitchen Appropriate non-routine labs are ordered dependent on drug ordered. . If applicable, additional medications reviewed and ordered per protocol based on lifetime cumulative doses and/or treatment regimen.   Plan for follow-up and/or issues identified: Yes . I-vent associated with next due treatment: Yes . See if MD wants to give Eligard next visit   Adelina Mings 04/22/2019 8:26 AM

## 2019-04-24 ENCOUNTER — Inpatient Hospital Stay (HOSPITAL_BASED_OUTPATIENT_CLINIC_OR_DEPARTMENT_OTHER): Payer: 59 | Admitting: Hospice and Palliative Medicine

## 2019-04-24 DIAGNOSIS — Z515 Encounter for palliative care: Secondary | ICD-10-CM

## 2019-04-24 NOTE — Progress Notes (Signed)
Was unable to reach patient by phone.  Unable to leave a voicemail.  Will reschedule appointment.

## 2019-04-28 ENCOUNTER — Other Ambulatory Visit: Payer: Self-pay

## 2019-04-28 ENCOUNTER — Inpatient Hospital Stay: Payer: 59

## 2019-04-28 DIAGNOSIS — Z79899 Other long term (current) drug therapy: Secondary | ICD-10-CM | POA: Diagnosis not present

## 2019-04-28 DIAGNOSIS — R110 Nausea: Secondary | ICD-10-CM | POA: Diagnosis not present

## 2019-04-28 DIAGNOSIS — C61 Malignant neoplasm of prostate: Secondary | ICD-10-CM

## 2019-04-28 DIAGNOSIS — C7951 Secondary malignant neoplasm of bone: Secondary | ICD-10-CM | POA: Diagnosis not present

## 2019-04-28 LAB — PSA: Prostatic Specific Antigen: 67.26 ng/mL — ABNORMAL HIGH (ref 0.00–4.00)

## 2019-04-28 LAB — COMPREHENSIVE METABOLIC PANEL
ALT: 25 U/L (ref 0–44)
AST: 16 U/L (ref 15–41)
Albumin: 3.7 g/dL (ref 3.5–5.0)
Alkaline Phosphatase: 40 U/L (ref 38–126)
Anion gap: 8 (ref 5–15)
BUN: 19 mg/dL (ref 8–23)
CO2: 27 mmol/L (ref 22–32)
Calcium: 9.4 mg/dL (ref 8.9–10.3)
Chloride: 101 mmol/L (ref 98–111)
Creatinine, Ser: 0.71 mg/dL (ref 0.61–1.24)
GFR calc Af Amer: >60 mL/min (ref >60)
GFR calc non Af Amer: >60 mL/min (ref >60)
Glucose, Bld: 139 mg/dL — ABNORMAL HIGH (ref 70–99)
Potassium: 4 mmol/L (ref 3.5–5.1)
Sodium: 136 mmol/L (ref 135–145)
Total Bilirubin: 0.3 mg/dL (ref 0.3–1.2)
Total Protein: 6.2 g/dL — ABNORMAL LOW (ref 6.5–8.1)

## 2019-04-28 LAB — CBC WITH DIFFERENTIAL/PLATELET
Abs Immature Granulocytes: 0.06 10*3/uL (ref 0.00–0.07)
Basophils Absolute: 0 10*3/uL (ref 0.0–0.1)
Basophils Relative: 0 %
Eosinophils Absolute: 0 10*3/uL (ref 0.0–0.5)
Eosinophils Relative: 0 %
HCT: 37.3 % — ABNORMAL LOW (ref 39.0–52.0)
Hemoglobin: 11.8 g/dL — ABNORMAL LOW (ref 13.0–17.0)
Immature Granulocytes: 1 %
Lymphocytes Relative: 20 %
Lymphs Abs: 1.4 10*3/uL (ref 0.7–4.0)
MCH: 30.5 pg (ref 26.0–34.0)
MCHC: 31.6 g/dL (ref 30.0–36.0)
MCV: 96.4 fL (ref 80.0–100.0)
Monocytes Absolute: 0.7 10*3/uL (ref 0.1–1.0)
Monocytes Relative: 11 %
Neutro Abs: 4.5 10*3/uL (ref 1.7–7.7)
Neutrophils Relative %: 68 %
Platelets: 203 10*3/uL (ref 150–400)
RBC: 3.87 MIL/uL — ABNORMAL LOW (ref 4.22–5.81)
RDW: 16.4 % — ABNORMAL HIGH (ref 11.5–15.5)
WBC: 6.8 10*3/uL (ref 4.0–10.5)
nRBC: 0 % (ref 0.0–0.2)

## 2019-04-29 ENCOUNTER — Inpatient Hospital Stay: Payer: 59

## 2019-04-29 ENCOUNTER — Inpatient Hospital Stay (HOSPITAL_BASED_OUTPATIENT_CLINIC_OR_DEPARTMENT_OTHER): Payer: 59 | Admitting: Internal Medicine

## 2019-04-29 DIAGNOSIS — C7951 Secondary malignant neoplasm of bone: Secondary | ICD-10-CM | POA: Diagnosis not present

## 2019-04-29 DIAGNOSIS — R110 Nausea: Secondary | ICD-10-CM | POA: Diagnosis not present

## 2019-04-29 DIAGNOSIS — C61 Malignant neoplasm of prostate: Secondary | ICD-10-CM

## 2019-04-29 DIAGNOSIS — Z79899 Other long term (current) drug therapy: Secondary | ICD-10-CM | POA: Diagnosis not present

## 2019-04-29 DIAGNOSIS — Z7189 Other specified counseling: Secondary | ICD-10-CM

## 2019-04-29 DIAGNOSIS — I25118 Atherosclerotic heart disease of native coronary artery with other forms of angina pectoris: Secondary | ICD-10-CM

## 2019-04-29 MED ORDER — SODIUM CHLORIDE 0.9 % IV SOLN
Freq: Once | INTRAVENOUS | Status: AC
  Filled 2019-04-29: qty 250

## 2019-04-29 MED ORDER — SODIUM CHLORIDE 0.9 % IV SOLN
36.0000 mg/m2 | Freq: Once | INTRAVENOUS | Status: AC
  Administered 2019-04-29: 12:00:00 70 mg via INTRAVENOUS
  Filled 2019-04-29: qty 7

## 2019-04-29 MED ORDER — HEPARIN SOD (PORK) LOCK FLUSH 100 UNIT/ML IV SOLN
500.0000 [IU] | Freq: Once | INTRAVENOUS | Status: DC | PRN
  Filled 2019-04-29: qty 5

## 2019-04-29 MED ORDER — DEXAMETHASONE SODIUM PHOSPHATE 10 MG/ML IJ SOLN
10.0000 mg | Freq: Once | INTRAMUSCULAR | Status: AC
  Administered 2019-04-29: 10 mg via INTRAVENOUS
  Filled 2019-04-29: qty 1

## 2019-04-29 NOTE — Progress Notes (Signed)
[facility] OFFICE PROGRESS NOTE  Patient Care Team: Olin Hauser, [name], MD as PCP - Cardiology (Cardiology) Minna Merritts, [name], [name]  Cancer Staging No matching staging information was found for the patient.   Oncology History Overview Note  # 2011- PROSTATE CANCER [Gleason 3+4]; s/p Prostatectomy [ also involved bladder neck/ECP; Dr.Polaseck]; July 2014- Biochemical recurrence [PSA 14]- started on Zoladex [Dr.Pandit]; lost to follow up.  # JAN 2017- STAGE IV METASTATIC PROSTATE Cancer to Bone- Feb 13th, 2017-  Lupron q 7m[~end of feb]; PSA: 1021; Declined Chemo; April 2017 [xofigo x6; Dr.Crystal]; AUG 2017- Zytiga + Prednisone BID. Bone scan-Jan 2018- improved skeletal metastases.   # MAY 1st 2019- START X-tandi [stopped zytiga/PSA- 8.8/rising]  #August 23, 2018-stop Xtandi [intolerance-fatigue mental fogginess]  #Mid September 2020-apalutamide; stopped mid-October poor tolerance/progression  # 11/01/2018- -Taxotere weekly [consent]  # Mets to bone- start X-geva q 39M [May 30th]; hold dental issues/infections  # Smoker/ chronic pain/pain clinic   #[April 2018; [place]]- s/p stenting; on Plavix; --------------------------------------------------------------  DIAGNOSIS: '[ ]'  Castrate resistant prostate cancer  STAGE:   4    ;GOALS: Palliative  CURRENT/MOST RECENT THERAPY-Late Oct 2020- Taxotere weekly [consent]    Prostate cancer metastatic to bone ([facility]  12/08/2014 Initial Diagnosis   Prostate cancer metastatic to bone ([facility]   11/01/2018 -  Chemotherapy   The patient had DOCEtaxel (TAXOTERE) 70 mg in sodium chloride 0.9 % 150 mL chemo infusion, 36 mg/m2 = 70 mg, Intravenous,  Once, 16 of 18 cycles Administration: 70 mg (11/08/2018), 70 mg (11/22/2018), 70 mg (12/13/2018), 70 mg (12/06/2018),  70 mg (12/31/2018), 70 mg (01/07/2019), 70 mg (01/29/2019), 70 mg (12/20/2018), 70 mg (02/06/2019), 70 mg (02/14/2019), 70 mg (02/28/2019), 70 mg (03/14/2019), 70 mg (03/28/2019), 70 mg (04/15/2019), 70 mg (04/29/2019)  for chemotherapy treatment.     INTERVAL HISTORY:  Robert LOCKLIN89y.o.  male pleasant patient above history of metastatic castrate resistant prostate cancer currently -on Taxotere weekly is here for follow-up.  Patient denies any worsening shortness of breath or cough.  He continues to intermittent nausea for which he takes Phenergan.  Patient continues to have chronic joint pains back pain for which he is on narcotic pain medication.   Review of Systems  Constitutional: Positive for malaise/fatigue and weight loss. Negative for chills, diaphoresis and fever.  HENT: Negative for nosebleeds and sore throat.   Eyes: Negative for double vision.  Respiratory: Negative for cough, hemoptysis, sputum production and wheezing.   Cardiovascular: Negative for chest pain, palpitations, orthopnea and leg swelling.  Gastrointestinal: Positive for nausea. Negative for abdominal pain, blood in stool, diarrhea, heartburn, melena and vomiting.  Genitourinary: Negative for dysuria, frequency and urgency.  Musculoskeletal: Positive for back pain and joint pain.  Skin: Negative for itching.  Neurological: Negative for dizziness, tingling, focal weakness, weakness and headaches.  Endo/Heme/Allergies: Does not bruise/bleed easily.  Psychiatric/Behavioral: Negative for depression. The patient is not nervous/anxious and does not have insomnia.       PAST MEDICAL HISTORY :  Past Medical History:  Diagnosis Date  . Back pain 10/09/2012  . Bone cancer ([facility]   . CAD (coronary artery disease)    a. 08/2015 PCI: LM nl, LAD 20d, LCX nl, RCA 247mRPAV 95 (3.0x18 Xience Alpine DES);  b. [facility] (2.75x8 Promus Premier DES).  .Marland Kitchen  Cancer associated pain   . Depression   . History of  echocardiogram    a. 08/2016 Echo: EF 50-55%.  Marland Kitchen History of kidney stones   . Hyperlipidemia   . Hypertension   . Joint pain   . Prostate cancer [facility])    a.  s/p prostatectomy ([facility]);  b. bone mets noted 04/2016.  . Right arm pain 01/10/2016  . Right foot pain 01/10/2016  . Right leg pain 01/10/2016  . Tobacco abuse     PAST SURGICAL HISTORY :   Past Surgical History:  Procedure Laterality Date  . CARDIAC CATHETERIZATION     [facility]  . CARDIAC CATHETERIZATION N/A 08/16/2015   Procedure: Left Heart Cath and Coronary Angiography;  Surgeon: Yolonda Kida, MD;  Location: [facility];  Service: Cardiovascular;  Laterality: N/A;  . CARDIAC CATHETERIZATION N/A 08/16/2015   Procedure: Coronary Stent Intervention;  Surgeon: Yolonda Kida, MD;  Location: [facility];  Service: Cardiovascular;  Laterality: N/A;  . CERVIX SURGERY    . PROSTATECTOMY    . SPINE SURGERY    . TONSILLECTOMY    . WRIST SURGERY      FAMILY HISTORY :   Family History  Problem Relation Age of Onset  . Heart attack Mother   . Hypertension Mother   . Heart attack Father     SOCIAL HISTORY:   Social History   Tobacco Use  . Smoking status: Current Every Day Smoker    Packs/day: 0.50    Years: 40.00    Pack years: 20.00    Types: Cigarettes  . Smokeless tobacco: Current User    Types: Chew  . Tobacco comment: 1 pack every couple days   Substance Use Topics  . Alcohol use: Yes    Comment: occasionally  . Drug use: No    ALLERGIES:  is allergic to ditropan [oxybutynin].  MEDICATIONS:  Current Outpatient Medications  Medication Sig Dispense Refill  . acetaminophen (TYLENOL) 500 MG tablet Take 1,000 mg by mouth every 8 (eight) hours as needed.     Marland Kitchen albuterol (PROVENTIL HFA;VENTOLIN HFA) 108 (90 Base) MCG/ACT inhaler Inhale 1-2 puffs into the lungs every 6 (six) hours as needed for wheezing or shortness of breath. 1 Inhaler 0  . aspirin 81 MG tablet Take 81 mg by mouth daily.     Marland Kitchen atorvastatin (LIPITOR) 80 MG tablet Take 1 tablet (80 mg total) by mouth daily at 6 PM. 90 tablet 3  . chlorhexidine (PERIDEX) 0.12 % solution     . clopidogrel (PLAVIX) 75 MG tablet Take 1 tablet (75 mg total) by mouth daily. 90 tablet 3  . dexamethasone (DECADRON) 4 MG tablet Take 4 mg by mouth daily. Take for 3 days with chemotherapy    . fentaNYL (DURAGESIC) 100 MCG/HR Place 1 patch onto the skin every other day. Along with 71mg patch for total of 125 mcg every 2 days. 15 patch 0  . fentaNYL (DURAGESIC) 25 MCG/HR Place 1 patch onto the skin every other day. Use this along with fentanyl patch 100 mcg [total 125 mcg] every 2 days 15 patch 0  . gabapentin (NEURONTIN) 100 MG capsule Take 1 capsule (100 mg total) by mouth 3 (three) times daily. 90 capsule 5  . magnesium hydroxide (MILK OF MAGNESIA) 400 MG/5ML suspension Take 5 mLs by mouth daily as needed for constipation.    . metoprolol tartrate (LOPRESSOR) 25 MG tablet TAKE 1/2 TABLET BY MOUTH 2 TIMES DAILY. 90 tablet  1  . naloxone (NARCAN) nasal spray 4 mg/0.1 mL 1 spray into nostril upon signs of opioid overdose. Call 911. May repeat once if no response within 2-3 minutes. 1 kit 0  . nitroGLYCERIN (NITROSTAT) 0.4 MG SL tablet Place 1 tablet (0.4 mg total) under the tongue every 5 (five) minutes as needed. 25 tablet 3  . ondansetron (ZOFRAN) 8 MG tablet One pill every 8 hours as needed for nausea/vomitting. 40 tablet 1  . oxyCODONE (ROXICODONE) 15 MG immediate release tablet Take 1 tablet (15 mg total) by mouth every 6 (six) hours as needed. for pain 90 tablet 0  . polyethylene glycol (MIRALAX / GLYCOLAX) packet Take 17 g by mouth daily as needed for moderate constipation.    . predniSONE (DELTASONE) 5 MG tablet TAKE 1 TABLET BY MOUTH DAILY WITH BREAKFAST. (Patient taking differently: Take 2.5 mg by mouth daily with breakfast. ) 30 tablet 1  . predniSONE (DELTASONE) 5 MG tablet Take 1 tablet (5 mg total) by mouth daily with breakfast. 30  tablet 3  . promethazine (PHENERGAN) 25 MG tablet TAKE 1/2 TO 1 TABLET (12.5-25 MG TOTAL) BY MOUTH EVERY 8 (EIGHT) HOURS AS NEEDED FOR NAUSEA OR VOMITING. 60 tablet 3  . traZODone (DESYREL) 50 MG tablet Take 1-2 tablets (50-100 mg total) by mouth at bedtime as needed for sleep. 60 tablet 2  . triamcinolone ointment (KENALOG) 0.5 % Apply 1 application topically 2 (two) times daily. 30 g 0  . Wheat Dextrin (BENEFIBER) POWD Stir 2 tsp. TID into 4-8 oz of any non-carbonated beverage or soft food (hot or cold) 500 g PRN   No current facility-administered medications for this visit.    PHYSICAL EXAMINATION: ECOG PERFORMANCE STATUS: 1 - Symptomatic but completely ambulatory  BP 136/79   Pulse (!) 102   Resp 20   Wt 158 lb 3.2 oz (71.8 kg)   BMI 22.06 kg/m   Filed Weights   04/29/19 1017  Weight: 158 lb 3.2 oz (71.8 kg)    Physical Exam  Constitutional: He is oriented to person, place, and time.   Frail-appearing Caucasian male patient.  He is in a walking with a cane.  Alone.  HENT:  Head: Normocephalic and atraumatic.  Mouth/Throat: Oropharynx is clear and moist. No oropharyngeal exudate.  Poor dentition.  Eyes: Pupils are equal, round, and reactive to light.  Cardiovascular: Normal rate and regular rhythm.  Pulmonary/Chest: No respiratory distress. He has no wheezes.  Decreased breath sounds bilaterally at the bases.  No wheeze or crackles  Abdominal: Soft. Bowel sounds are normal. He exhibits no distension and no mass. There is no abdominal tenderness. There is no rebound and no guarding.  Musculoskeletal:        General: No tenderness or edema. Normal range of motion.     Cervical back: Normal range of motion and neck supple.  Neurological: He is alert and oriented to person, place, and time.  Skin: Skin is warm.  Macular rash on hands  Psychiatric: Affect normal.    LABORATORY DATA:  I have reviewed the data as listed    Component Value Date/Time   NA 136 04/28/2019  1259   NA 140 07/13/2013 1542   K 4.0 04/28/2019 1259   K 4.3 07/13/2013 1542   CL 101 04/28/2019 1259   CL 107 07/13/2013 1542   CO2 27 04/28/2019 1259   CO2 26 07/13/2013 1542   GLUCOSE 139 (H) 04/28/2019 1259   GLUCOSE 106 (H) 07/13/2013 1542  BUN 19 04/28/2019 1259   BUN 11 07/13/2013 1542   CREATININE 0.71 04/28/2019 1259   CREATININE 0.76 10/15/2017 1001   CALCIUM 9.4 04/28/2019 1259   CALCIUM 9.9 07/13/2013 1542   PROT 6.2 (L) 04/28/2019 1259   PROT 8.1 07/13/2013 1542   ALBUMIN 3.7 04/28/2019 1259   ALBUMIN 4.3 07/13/2013 1542   AST 16 04/28/2019 1259   AST 26 07/13/2013 1542   ALT 25 04/28/2019 1259   ALT 32 07/13/2013 1542   ALKPHOS 40 04/28/2019 1259   ALKPHOS 74 07/13/2013 1542   BILITOT 0.3 04/28/2019 1259   BILITOT 0.4 07/13/2013 1542   GFRNONAA >60 04/28/2019 1259   GFRNONAA 95 10/15/2017 1001   GFRAA >60 04/28/2019 1259   GFRAA 110 10/15/2017 1001    No results found for: SPEP, UPEP  Lab Results  Component Value Date   WBC 6.8 04/28/2019   NEUTROABS 4.5 04/28/2019   HGB 11.8 (L) 04/28/2019   HCT 37.3 (L) 04/28/2019   MCV 96.4 04/28/2019   PLT 203 04/28/2019      Chemistry      Component Value Date/Time   NA 136 04/28/2019 1259   NA 140 07/13/2013 1542   K 4.0 04/28/2019 1259   K 4.3 07/13/2013 1542   CL 101 04/28/2019 1259   CL 107 07/13/2013 1542   CO2 27 04/28/2019 1259   CO2 26 07/13/2013 1542   BUN 19 04/28/2019 1259   BUN 11 07/13/2013 1542   CREATININE 0.71 04/28/2019 1259   CREATININE 0.76 10/15/2017 1001      Component Value Date/Time   CALCIUM 9.4 04/28/2019 1259   CALCIUM 9.9 07/13/2013 1542   ALKPHOS 40 04/28/2019 1259   ALKPHOS 74 07/13/2013 1542   AST 16 04/28/2019 1259   AST 26 07/13/2013 1542   ALT 25 04/28/2019 1259   ALT 32 07/13/2013 1542   BILITOT 0.3 04/28/2019 1259   BILITOT 0.4 07/13/2013 1542     RADIOGRAPHIC STUDIES: I have personally reviewed the radiological images as listed and agreed with the  findings in the report. No results found.   ASSESSMENT & PLAN:  Prostate cancer metastatic to bone ([place]) # Castrate resistant prostate cancer metastases to bone. Lupron every 3 months [due 04/03/2019]. Sep 2020; PSA- 190; rising. PET aux- 10/15- Progressive bone/ Left suprahilar-mediastinal LN. Currently, on Taxotere every 2 week. Clinically, stable. PSA-67.   # proceed with chemotherapy Taxotere #15 every 2 weeks Labs today reviewed;  acceptable for treatment today.  Given the flattening of PSA; I would recommend PET scan for further evaluation.  #Nausea without vomiting -stable prescription for Phenergan.  # Malignancy related pain- continue fentanyl patch hydrocodone;. Continue fentanyl patch to every 48 hours and continue oxycodone prn; stable.   # DISPOSITION:  # Taxotere chemo today # in 2 weeks- MD;  Reva Bores- cbc/cmp/PSA 1 day prior in [place];] Taxoetere;Eligard; PET prior--Dr.B    Orders Placed This Encounter  Procedures  . NM PET (AXUMIN) SKULL BASE TO MID THIGH    Standing Status:   Future    Standing Expiration Date:   06/28/2020    Order Specific Question:   ** REASON FOR EXAM (FREE TEXT)    Answer:   prostate cancer on chemo    Order Specific Question:   If indicated for the ordered procedure, I authorize the administration of a radiopharmaceutical per Radiology protocol    Answer:   Yes    Order Specific Question:   Preferred imaging location?  Answer:   [facility]    Order Specific Question:   Radiology Contrast Protocol - do NOT remove file path    Answer:   [email or web address]  . CBC with Differential    Standing Status:   Future    Standing Expiration Date:   04/28/2020  . Comprehensive metabolic panel    Standing Status:   Future    Standing Expiration Date:   04/28/2020  . PSA    Standing Status:   Future    Standing Expiration Date:   04/28/2020   All questions were answered. The patient knows to call the clinic with any problems,  questions or concerns.      [name], MD 05/05/2019 11:15 PM

## 2019-04-29 NOTE — Assessment & Plan Note (Addendum)
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 Lupron every 3 months [due 04/03/2019]. Sep 2020; PSA- 190; rising. PET aux- 10/15- Progressive bone/ Left suprahilar-mediastinal LN. Currently, on Taxotere every 2 week. Clinically, stable. PSA-67.   # proceed with chemotherapy Taxotere #15 every 2 weeks Labs today reviewed;  acceptable for treatment today.  Given the flattening of PSA; I would recommend PET scan for further evaluation.  #Nausea without vomiting -stable prescription for Phenergan.  # Malignancy related pain- continue fentanyl patch hydrocodone;. Continue fentanyl patch to every 48 hours and continue oxycodone prn; stable.   # DISPOSITION:  # Taxotere chemo today # in 2 weeks- MD;  Reva Bores- cbc/cmp/PSA 1 day prior in [place];] Taxoetere;Eligard; PET prior--Dr.B

## 2019-04-29 NOTE — Progress Notes (Signed)
HR 102 ok to proceed per md 

## 2019-04-29 NOTE — Progress Notes (Signed)
Patient here today for follow up and treatment consideration today regarding prostate cancer. Patient reports appetite is improving, reports pain 3 today. Patient reports pain medication helps control pain at this time.

## 2019-05-05 ENCOUNTER — Other Ambulatory Visit: Payer: Self-pay | Admitting: Internal Medicine

## 2019-05-05 DIAGNOSIS — G893 Neoplasm related pain (acute) (chronic): Secondary | ICD-10-CM

## 2019-05-05 DIAGNOSIS — C7951 Secondary malignant neoplasm of bone: Secondary | ICD-10-CM

## 2019-05-05 DIAGNOSIS — C61 Malignant neoplasm of prostate: Secondary | ICD-10-CM

## 2019-05-05 NOTE — Telephone Encounter (Signed)
Dr. B patient  

## 2019-05-06 MED ORDER — FENTANYL 25 MCG/HR TD PT72: 1.0000 | MEDICATED_PATCH | TRANSDERMAL | 0 refills | Status: DC

## 2019-05-06 MED ORDER — FENTANYL 100 MCG/HR TD PT72: 1.0000 | MEDICATED_PATCH | TRANSDERMAL | 0 refills | Status: DC

## 2019-05-06 MED ORDER — OXYCODONE HCL 15 MG PO TABS: 15.0000 mg | ORAL_TABLET | Freq: Four times a day (QID) | ORAL | 0 refills | Status: DC | PRN

## 2019-05-06 NOTE — Progress Notes (Signed)
Pharmacist Chemotherapy Monitoring - Follow Up Assessment    I verify that I have reviewed each item in the below checklist:  . Regimen for the patient is scheduled for the appropriate day and plan matches scheduled date. Marland Kitchen Appropriate non-routine labs are ordered dependent on drug ordered. . If applicable, additional medications reviewed and ordered per protocol based on lifetime cumulative doses and/or treatment regimen.   Plan for follow-up and/or issues identified: No . I-vent associated with next due treatment: Yes . MD and/or nursing notified: No  Adelina Mings 05/06/2019 8:40 AM

## 2019-05-07 ENCOUNTER — Other Ambulatory Visit: Payer: Self-pay

## 2019-05-07 ENCOUNTER — Ambulatory Visit
Admission: RE | Admit: 2019-05-07 | Discharge: 2019-05-07 | Disposition: A | Discharge status: Home / Self Care | Payer: 59 | Source: Ambulatory Visit | Attending: Internal Medicine | Admitting: Internal Medicine

## 2019-05-07 DIAGNOSIS — C61 Malignant neoplasm of prostate: Secondary | ICD-10-CM | POA: Insufficient documentation

## 2019-05-07 DIAGNOSIS — C7951 Secondary malignant neoplasm of bone: Secondary | ICD-10-CM | POA: Insufficient documentation

## 2019-05-07 MED ORDER — AXUMIN (FLUCICLOVINE F 18) INJECTION
10.0000 | Freq: Once | INTRAVENOUS | Status: AC | PRN
  Administered 2019-05-07: 14:00:00 10.49 via INTRAVENOUS

## 2019-05-08 ENCOUNTER — Telehealth: Payer: Self-pay | Admitting: Internal Medicine

## 2019-05-08 NOTE — Telephone Encounter (Signed)
Spoke to patient regarding the positive response noted on the PET scan.  Follow-up as planned

## 2019-05-12 ENCOUNTER — Inpatient Hospital Stay: Payer: 59 | Attending: Hematology and Oncology

## 2019-05-12 ENCOUNTER — Other Ambulatory Visit: Payer: Self-pay

## 2019-05-12 DIAGNOSIS — Z87442 Personal history of urinary calculi: Secondary | ICD-10-CM | POA: Diagnosis not present

## 2019-05-12 DIAGNOSIS — R110 Nausea: Secondary | ICD-10-CM | POA: Insufficient documentation

## 2019-05-12 DIAGNOSIS — I2510 Atherosclerotic heart disease of native coronary artery without angina pectoris: Secondary | ICD-10-CM | POA: Diagnosis not present

## 2019-05-12 DIAGNOSIS — Z5111 Encounter for antineoplastic chemotherapy: Secondary | ICD-10-CM | POA: Diagnosis not present

## 2019-05-12 DIAGNOSIS — C7951 Secondary malignant neoplasm of bone: Secondary | ICD-10-CM | POA: Insufficient documentation

## 2019-05-12 DIAGNOSIS — Z9079 Acquired absence of other genital organ(s): Secondary | ICD-10-CM | POA: Diagnosis not present

## 2019-05-12 DIAGNOSIS — E785 Hyperlipidemia, unspecified: Secondary | ICD-10-CM | POA: Insufficient documentation

## 2019-05-12 DIAGNOSIS — Z955 Presence of coronary angioplasty implant and graft: Secondary | ICD-10-CM | POA: Diagnosis not present

## 2019-05-12 DIAGNOSIS — I10 Essential (primary) hypertension: Secondary | ICD-10-CM | POA: Diagnosis not present

## 2019-05-12 DIAGNOSIS — Z79899 Other long term (current) drug therapy: Secondary | ICD-10-CM | POA: Diagnosis not present

## 2019-05-12 DIAGNOSIS — G893 Neoplasm related pain (acute) (chronic): Secondary | ICD-10-CM | POA: Diagnosis not present

## 2019-05-12 DIAGNOSIS — Z7982 Long term (current) use of aspirin: Secondary | ICD-10-CM | POA: Insufficient documentation

## 2019-05-12 DIAGNOSIS — C61 Malignant neoplasm of prostate: Secondary | ICD-10-CM | POA: Diagnosis not present

## 2019-05-12 DIAGNOSIS — F329 Major depressive disorder, single episode, unspecified: Secondary | ICD-10-CM | POA: Insufficient documentation

## 2019-05-12 DIAGNOSIS — F17210 Nicotine dependence, cigarettes, uncomplicated: Secondary | ICD-10-CM | POA: Insufficient documentation

## 2019-05-12 LAB — COMPREHENSIVE METABOLIC PANEL
ALT: 16 U/L (ref 0–44)
AST: 16 U/L (ref 15–41)
Albumin: 3.6 g/dL (ref 3.5–5.0)
Alkaline Phosphatase: 38 U/L (ref 38–126)
Anion gap: 7 (ref 5–15)
BUN: 18 mg/dL (ref 8–23)
CO2: 26 mmol/L (ref 22–32)
Calcium: 9.1 mg/dL (ref 8.9–10.3)
Chloride: 103 mmol/L (ref 98–111)
Creatinine, Ser: 0.75 mg/dL (ref 0.61–1.24)
GFR calc Af Amer: >60 mL/min (ref >60)
GFR calc non Af Amer: >60 mL/min (ref >60)
Glucose, Bld: 129 mg/dL — ABNORMAL HIGH (ref 70–99)
Potassium: 4.2 mmol/L (ref 3.5–5.1)
Sodium: 136 mmol/L (ref 135–145)
Total Bilirubin: 0.5 mg/dL (ref 0.3–1.2)
Total Protein: 6 g/dL — ABNORMAL LOW (ref 6.5–8.1)

## 2019-05-12 LAB — PSA: Prostatic Specific Antigen: 71.38 ng/mL — ABNORMAL HIGH (ref 0.00–4.00)

## 2019-05-12 LAB — CBC WITH DIFFERENTIAL/PLATELET
Abs Immature Granulocytes: 0.09 10*3/uL — ABNORMAL HIGH (ref 0.00–0.07)
Basophils Absolute: 0 10*3/uL (ref 0.0–0.1)
Basophils Relative: 0 %
Eosinophils Absolute: 0 10*3/uL (ref 0.0–0.5)
Eosinophils Relative: 0 %
HCT: 34.2 % — ABNORMAL LOW (ref 39.0–52.0)
Hemoglobin: 11.1 g/dL — ABNORMAL LOW (ref 13.0–17.0)
Immature Granulocytes: 1 %
Lymphocytes Relative: 15 %
Lymphs Abs: 1 10*3/uL (ref 0.7–4.0)
MCH: 30.8 pg (ref 26.0–34.0)
MCHC: 32.5 g/dL (ref 30.0–36.0)
MCV: 95 fL (ref 80.0–100.0)
Monocytes Absolute: 0.7 10*3/uL (ref 0.1–1.0)
Monocytes Relative: 10 %
Neutro Abs: 4.8 10*3/uL (ref 1.7–7.7)
Neutrophils Relative %: 74 %
Platelets: 203 10*3/uL (ref 150–400)
RBC: 3.6 MIL/uL — ABNORMAL LOW (ref 4.22–5.81)
RDW: 16.8 % — ABNORMAL HIGH (ref 11.5–15.5)
WBC: 6.6 10*3/uL (ref 4.0–10.5)
nRBC: 0 % (ref 0.0–0.2)

## 2019-05-13 ENCOUNTER — Inpatient Hospital Stay (HOSPITAL_BASED_OUTPATIENT_CLINIC_OR_DEPARTMENT_OTHER): Payer: 59 | Admitting: Internal Medicine

## 2019-05-13 ENCOUNTER — Inpatient Hospital Stay: Payer: 59

## 2019-05-13 VITALS — BP 148/78

## 2019-05-13 DIAGNOSIS — Z79899 Other long term (current) drug therapy: Secondary | ICD-10-CM | POA: Diagnosis not present

## 2019-05-13 DIAGNOSIS — R110 Nausea: Secondary | ICD-10-CM | POA: Diagnosis not present

## 2019-05-13 DIAGNOSIS — C61 Malignant neoplasm of prostate: Secondary | ICD-10-CM

## 2019-05-13 DIAGNOSIS — C7951 Secondary malignant neoplasm of bone: Secondary | ICD-10-CM

## 2019-05-13 DIAGNOSIS — I25118 Atherosclerotic heart disease of native coronary artery with other forms of angina pectoris: Secondary | ICD-10-CM

## 2019-05-13 DIAGNOSIS — Z5111 Encounter for antineoplastic chemotherapy: Secondary | ICD-10-CM | POA: Diagnosis not present

## 2019-05-13 DIAGNOSIS — I2510 Atherosclerotic heart disease of native coronary artery without angina pectoris: Secondary | ICD-10-CM | POA: Diagnosis not present

## 2019-05-13 DIAGNOSIS — I10 Essential (primary) hypertension: Secondary | ICD-10-CM | POA: Diagnosis not present

## 2019-05-13 DIAGNOSIS — Z9079 Acquired absence of other genital organ(s): Secondary | ICD-10-CM | POA: Diagnosis not present

## 2019-05-13 DIAGNOSIS — G893 Neoplasm related pain (acute) (chronic): Secondary | ICD-10-CM | POA: Diagnosis not present

## 2019-05-13 MED ORDER — LEUPROLIDE ACETATE (3 MONTH) 22.5 MG ~~LOC~~ KIT
22.5000 mg | PACK | Freq: Once | SUBCUTANEOUS | Status: AC
  Administered 2019-05-13: 22.5 mg via SUBCUTANEOUS
  Filled 2019-05-13: qty 22.5

## 2019-05-13 NOTE — Assessment & Plan Note (Addendum)
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 Lupronq 3 M; Taxotere q 2 weeks; MAY 2021- PET- Improved; PSA- Plateaued- 60-70s.  # HOLD chemo today- see below-as patient is feeling poorly.[See below]  # "Not feeling well/foggy"-etiology is unclear.  Recommend close monitoring; discussed with patient's wife-let us know if patient's condition worsens or not improved.  # Nausea without vomiting stable  # Malignancy related pain- continue fentanyl patch hydrocodone;. Continue fentanyl patch to every 48 hours and continue oxycodone prn; stable  # DISPOSITION:  # HOLD chemo today; Eligard today # in 2 weeks- MD;  Reva Bores- cbc/cmp/PSA 1 day prior in [place];] Taxoetere;Eligard-Dr.B

## 2019-05-13 NOTE — Progress Notes (Signed)
[place] OFFICE PROGRESS NOTE  Patient Care Team: Olin Hauser, [name], MD as PCP - Cardiology (Cardiology) Minna Merritts, [name], [name]  Cancer Staging No matching staging information was found for the patient.   Oncology History Overview Note  # 2011- PROSTATE CANCER [Gleason 3+4]; s/p Prostatectomy [ also involved bladder neck/ECP; Dr.Polaseck]; July 2014- Biochemical recurrence [PSA 14]- started on Zoladex [Dr.Pandit]; lost to follow up.  # JAN 2017- STAGE IV METASTATIC PROSTATE Cancer to Bone- Feb 13th, 2017-  Lupron q 76m[~end of feb]; PSA: 1021; Declined Chemo; April 2017 [xofigo x6; Dr.Crystal]; AUG 2017- Zytiga + Prednisone BID. Bone scan-Jan 2018- improved skeletal metastases.   # MAY 1st 2019- START X-tandi [stopped zytiga/PSA- 8.8/rising]  #August 23, 2018-stop Xtandi [intolerance-fatigue mental fogginess]  #Mid September 2020-apalutamide; stopped mid-October poor tolerance/progression  # 11/01/2018- -Taxotere weekly [consent]  # Mets to bone- start X-geva q 7M [May 30th]; hold dental issues/infections  # Smoker/ chronic pain/pain clinic   #[April 2018; [place]]- s/p stenting; on Plavix; --------------------------------------------------------------  DIAGNOSIS: '[ ]'  Castrate resistant prostate cancer  STAGE:   4    ;GOALS: Palliative  CURRENT/MOST RECENT THERAPY-Late Oct 2020- Taxotere weekly [consent]    [place]  12/[place]/2014 Initial Diagnosis   [place]   11/01/2018 -  Chemotherapy   The patient had DOCEtaxel (TAXOTERE) 70 mg in sodium chloride 0.9 % 150 mL chemo infusion, 36 mg/m2 = 70 mg, Intravenous,  Once, 16 of 18 cycles Administration: 70 mg (11/08/2018), 70 mg (11/22/2018), 70 mg (12/13/2018), 70 mg (12/06/2018),  70 mg (12/31/2018), 70 mg (01/07/2019), 70 mg (01/29/2019), 70 mg (12/20/2018), 70 mg (02/06/2019), 70 mg (02/14/2019), 70 mg (02/28/2019), 70 mg (03/14/2019), 70 mg (03/28/2019), 70 mg (04/15/2019), 70 mg (04/29/2019)  for chemotherapy treatment.     INTERVAL HISTORY:  SGLYN GERADS89y.o.  male pleasant patient above history of metastatic castrate resistant prostate cancer currently -on Taxotere weekly is here for follow-up/review results of the PET scan  Patient states that he feels poorly.  States he feels "foggy".  Denies any fevers or chills.  Continues have chronic nausea for which he takes Phenergan.  Chronic joint pains back pain not any worse.  Continues with narcotic pain medication.  He denies taking any extra narcotic pain medication  Review of Systems  Constitutional: Positive for malaise/fatigue and weight loss. Negative for chills, diaphoresis and fever.  HENT: Negative for nosebleeds and sore throat.   Eyes: Negative for double vision.  Respiratory: Negative for cough, hemoptysis, sputum production and wheezing.   Cardiovascular: Negative for chest pain, palpitations, orthopnea and leg swelling.  Gastrointestinal: Positive for nausea. Negative for abdominal pain, blood in stool, diarrhea, heartburn, melena and vomiting.  Genitourinary: Negative for dysuria, frequency and urgency.  Musculoskeletal: Positive for back pain and joint pain.  Skin: Negative for itching.  Neurological: Negative for dizziness, tingling, focal weakness, weakness and headaches.  Endo/Heme/Allergies: Does not bruise/bleed easily.  Psychiatric/Behavioral: Negative for depression. The patient is not nervous/anxious and does not have insomnia.       PAST MEDICAL HISTORY :  Past Medical History:  Diagnosis Date  . Back pain 10/09/2012  . [place]   . CAD (coronary artery disease)    a. 08/2015 PCI: LM nl, LAD 20d, LCX nl, RCA 268mRPAV 95 (  3.0x18 Xience Alpine DES);  b. 04/2016 PCI [facility]): RPL 95  (2.75x8 Promus Premier DES).  . Cancer associated pain   . Depression   . History of echocardiogram    a. 08/2016 Echo: EF 50-55%.  Marland Kitchen History of kidney stones   . Hyperlipidemia   . Hypertension   . Joint pain   . Prostate cancer [facility])    a.  s/p prostatectomy ([facility]);  b. bone mets noted 04/2016.  . Right arm pain 01/10/2016  . Right foot pain 01/10/2016  . Right leg pain 01/10/2016  . Tobacco abuse     PAST SURGICAL HISTORY :   Past Surgical History:  Procedure Laterality Date  . CARDIAC CATHETERIZATION     [facility]  . CARDIAC CATHETERIZATION N/A 08/16/2015   Procedure: Left Heart Cath and Coronary Angiography;  Surgeon: Yolonda Kida, MD;  Location: [facility];  Service: Cardiovascular;  Laterality: N/A;  . CARDIAC CATHETERIZATION N/A 08/16/2015   Procedure: Coronary Stent Intervention;  Surgeon: Yolonda Kida, MD;  Location: [facility];  Service: Cardiovascular;  Laterality: N/A;  . CERVIX SURGERY    . PROSTATECTOMY    . SPINE SURGERY    . TONSILLECTOMY    . WRIST SURGERY      FAMILY HISTORY :   Family History  Problem Relation Age of Onset  . Heart attack Mother   . Hypertension Mother   . Heart attack Father     SOCIAL HISTORY:   Social History   Tobacco Use  . Smoking status: Current Every Day Smoker    Packs/day: 0.50    Years: 40.00    Pack years: 20.00    Types: Cigarettes  . Smokeless tobacco: Current User    Types: Chew  . Tobacco comment: 1 pack every couple days   Substance Use Topics  . Alcohol use: Yes    Comment: occasionally  . Drug use: No    ALLERGIES:  is allergic to ditropan [oxybutynin].  MEDICATIONS:  Current Outpatient Medications  Medication Sig Dispense Refill  . acetaminophen (TYLENOL) 500 MG tablet Take 1,000 mg by mouth every 8 (eight) hours as needed.     Marland Kitchen albuterol (PROVENTIL HFA;VENTOLIN HFA) 108 (90 Base) MCG/ACT inhaler Inhale 1-2 puffs into the lungs every 6 (six) hours as needed for wheezing or  shortness of breath. 1 Inhaler 0  . aspirin 81 MG tablet Take 81 mg by mouth daily.    Marland Kitchen atorvastatin (LIPITOR) 80 MG tablet Take 1 tablet (80 mg total) by mouth daily at 6 PM. 90 tablet 3  . clopidogrel (PLAVIX) 75 MG tablet Take 1 tablet (75 mg total) by mouth daily. 90 tablet 3  . fentaNYL (DURAGESIC) 100 MCG/HR Place 1 patch onto the skin every other day. Along with 61mg patch for total of 125 mcg every 2 days. 15 patch 0  . fentaNYL (DURAGESIC) 25 MCG/HR Place 1 patch onto the skin every other day. Use this along with fentanyl patch 100 mcg [total 125 mcg] every 2 days 15 patch 0  . gabapentin (NEURONTIN) 100 MG capsule Take 1 capsule (100 mg total) by mouth 3 (three) times daily. 90 capsule 5  . metoprolol tartrate (LOPRESSOR) 25 MG tablet TAKE 1/2 TABLET BY MOUTH 2 TIMES DAILY. 90 tablet 1  . oxyCODONE (ROXICODONE) 15 MG immediate release tablet Take 1 tablet (15 mg total) by mouth every 6 (six) hours as needed. for pain 90 tablet 0  . predniSONE (DELTASONE) 5 MG tablet  Take 1 tablet (5 mg total) by mouth daily with breakfast. 30 tablet 3  . promethazine (PHENERGAN) 25 MG tablet TAKE 1/2 TO 1 TABLET (12.5-25 MG TOTAL) BY MOUTH EVERY 8 (EIGHT) HOURS AS NEEDED FOR NAUSEA OR VOMITING. 60 tablet 3  . traZODone (DESYREL) 50 MG tablet Take 1-2 tablets (50-100 mg total) by mouth at bedtime as needed for sleep. 60 tablet 2  . triamcinolone ointment (KENALOG) 0.5 % Apply 1 application topically 2 (two) times daily. 30 g 0  . Wheat Dextrin (BENEFIBER) POWD Stir 2 tsp. TID into 4-8 oz of any non-carbonated beverage or soft food (hot or cold) 500 g PRN  . chlorhexidine (PERIDEX) 0.12 % solution     . dexamethasone (DECADRON) 4 MG tablet Take 4 mg by mouth daily. Take for 3 days with chemotherapy    . magnesium hydroxide (MILK OF MAGNESIA) 400 MG/5ML suspension Take 5 mLs by mouth daily as needed for constipation.    . naloxone (NARCAN) nasal spray 4 mg/0.1 mL 1 spray into nostril upon signs of opioid  overdose. Call 911. May repeat once if no response within 2-3 minutes. (Patient not taking: Reported on 05/13/2019) 1 kit 0  . nitroGLYCERIN (NITROSTAT) 0.4 MG SL tablet Place 1 tablet (0.4 mg total) under the tongue every 5 (five) minutes as needed. (Patient not taking: Reported on 05/13/2019) 25 tablet 3  . ondansetron (ZOFRAN) 8 MG tablet One pill every 8 hours as needed for nausea/vomitting. (Patient not taking: Reported on 05/13/2019) 40 tablet 1  . polyethylene glycol (MIRALAX / GLYCOLAX) packet Take 17 g by mouth daily as needed for moderate constipation.     No current facility-administered medications for this visit.    PHYSICAL EXAMINATION: ECOG PERFORMANCE STATUS: 1 - Symptomatic but completely ambulatory  BP (!) 152/100   Pulse 77   Temp (!) 96.2 F (35.7 C) (Tympanic)   Resp 20   Ht '5\' 11"'  (1.803 m)   Wt 158 lb (71.7 kg)   BMI 22.04 kg/m   Filed Weights   05/13/19 1000  Weight: 158 lb (71.7 kg)    Physical Exam  Constitutional: He is oriented to person, place, and time.   Frail-appearing Caucasian male patient.  He is in a walking with a cane.  Alone.  HENT:  Head: Normocephalic and atraumatic.  Mouth/Throat: Oropharynx is clear and moist. No oropharyngeal exudate.  Poor dentition.  Eyes: Pupils are equal, round, and reactive to light.  Cardiovascular: Normal rate and regular rhythm.  Pulmonary/Chest: No respiratory distress. He has no wheezes.  Decreased breath sounds bilaterally at the bases.  No wheeze or crackles  Abdominal: Soft. Bowel sounds are normal. He exhibits no distension and no mass. There is no abdominal tenderness. There is no rebound and no guarding.  Musculoskeletal:        General: No tenderness or edema. Normal range of motion.     Cervical back: Normal range of motion and neck supple.  Neurological: He is alert and oriented to person, place, and time.  Skin: Skin is warm.  Macular rash on hands  Psychiatric: Affect normal.    LABORATORY  DATA:  I have reviewed the data as listed    Component Value Date/Time   NA 136 05/12/2019 1330   NA 140 07/13/2013 1542   K 4.2 05/12/2019 1330   K 4.3 07/13/2013 1542   CL 103 05/12/2019 1330   CL 107 07/13/2013 1542   CO2 26 05/12/2019 1330   CO2 26 07/13/2013  1542   GLUCOSE 129 (H) 05/12/2019 1330   GLUCOSE 106 (H) 07/13/2013 1542   BUN 18 05/12/2019 1330   BUN 11 07/13/2013 1542   CREATININE 0.75 05/12/2019 1330   CREATININE 0.76 10/15/2017 1001   CALCIUM 9.1 05/12/2019 1330   CALCIUM 9.9 07/13/2013 1542   PROT 6.0 (L) 05/12/2019 1330   PROT 8.1 07/13/2013 1542   ALBUMIN 3.6 05/12/2019 1330   ALBUMIN 4.3 07/13/2013 1542   AST 16 05/12/2019 1330   AST 26 07/13/2013 1542   ALT 16 05/12/2019 1330   ALT 32 07/13/2013 1542   ALKPHOS 38 05/12/2019 1330   ALKPHOS 74 07/13/2013 1542   BILITOT 0.5 05/12/2019 1330   BILITOT 0.4 07/13/2013 1542   GFRNONAA >60 05/12/2019 1330   GFRNONAA 95 10/15/2017 1001   GFRAA >60 05/12/2019 1330   GFRAA 110 10/15/2017 1001    No results found for: SPEP, UPEP  Lab Results  Component Value Date   WBC 6.6 05/12/2019   NEUTROABS 4.8 05/12/2019   HGB 11.1 (L) 05/12/2019   HCT 34.2 (L) 05/12/2019   MCV 95.0 05/12/2019   PLT 203 05/12/2019      Chemistry      Component Value Date/Time   NA 136 05/12/2019 1330   NA 140 07/13/2013 1542   K 4.2 05/12/2019 1330   K 4.3 07/13/2013 1542   CL 103 05/12/2019 1330   CL 107 07/13/2013 1542   CO2 26 05/12/2019 1330   CO2 26 07/13/2013 1542   BUN 18 05/12/2019 1330   BUN 11 07/13/2013 1542   CREATININE 0.75 05/12/2019 1330   CREATININE 0.76 10/15/2017 1001      Component Value Date/Time   CALCIUM 9.1 05/12/2019 1330   CALCIUM 9.9 07/13/2013 1542   ALKPHOS 38 05/12/2019 1330   ALKPHOS 74 07/13/2013 1542   AST 16 05/12/2019 1330   AST 26 07/13/2013 1542   ALT 16 05/12/2019 1330   ALT 32 07/13/2013 1542   BILITOT 0.5 05/12/2019 1330   BILITOT 0.4 07/13/2013 1542     RADIOGRAPHIC  STUDIES: I have personally reviewed the radiological images as listed and agreed with the findings in the report. No results found.   ASSESSMENT & PLAN:  Prostate cancer metastatic to bone ([place]) # Castrate resistant prostate cancer metastases to bone. Lupronq 3 M; Taxotere q 2 weeks; MAY 2021- PET- Improved; PSA- Plateaued- 60-70s.  # HOLD chemo today- see below-as patient is feeling poorly.[See below]  # "Not feeling well/foggy"-etiology is unclear.  Recommend close monitoring; discussed with patient's wife-let us know if patient's condition worsens or not improved.  # Nausea without vomiting stable  # Malignancy related pain- continue fentanyl patch hydrocodone;. Continue fentanyl patch to every 48 hours and continue oxycodone prn; stable  # DISPOSITION:  # HOLD chemo today; Eligard today # in 2 weeks- MD;  Reva Bores- cbc/cmp/PSA 1 day prior in [place];] Taxoetere;Eligard-Dr.B    Orders Placed This Encounter  Procedures  . CBC with Differential    Standing Status:   Future    Standing Expiration Date:   05/12/2020  . Comprehensive metabolic panel    Standing Status:   Future    Standing Expiration Date:   05/12/2020  . PSA    Standing Status:   Future    Standing Expiration Date:   05/12/2020   All questions were answered. The patient knows to call the clinic with any problems, questions or concerns.      [name], MD 05/20/2019  1:56 PM

## 2019-05-13 NOTE — Progress Notes (Signed)
Patient here for follow-up for prostate cancer. Pt states that "I'm having trouble with my mind today. Something is not right. I don't know how to explain it." patient denies any anxiety, worsening depression, no suicidal ideation thoughts. Patient reports intermittent headache. bp elevated today 141/115 (left arm);  152/100 (right arm). Patient took his blood pressure medications as directed. He denies any pain.

## 2019-05-14 ENCOUNTER — Telehealth: Payer: Self-pay | Admitting: Internal Medicine

## 2019-05-14 NOTE — Telephone Encounter (Signed)
On 5/11-I spoke to patient's wife Robert Short regarding patient's clinical status; reason to hold chemotherapy.  Follow-up as planned.  Patient does not feel well need to call us/for repeat evaluation symptom management clinic

## 2019-05-20 NOTE — Progress Notes (Signed)
Pharmacist Chemotherapy Monitoring - Follow Up Assessment    I verify that I have reviewed each item in the below checklist:  . Regimen for the patient is scheduled for the appropriate day and plan matches scheduled date. Marland Kitchen Appropriate non-routine labs are ordered dependent on drug ordered. . If applicable, additional medications reviewed and ordered per protocol based on lifetime cumulative doses and/or treatment regimen.   Plan for follow-up and/or issues identified: No . I-vent associated with next due treatment: No . MD and/or nursing notified: No  Robert Short K 05/20/2019 8:42 AM

## 2019-05-26 ENCOUNTER — Other Ambulatory Visit: Payer: Self-pay

## 2019-05-26 ENCOUNTER — Inpatient Hospital Stay: Payer: 59

## 2019-05-26 ENCOUNTER — Inpatient Hospital Stay (HOSPITAL_BASED_OUTPATIENT_CLINIC_OR_DEPARTMENT_OTHER): Payer: 59 | Admitting: Hospice and Palliative Medicine

## 2019-05-26 DIAGNOSIS — I10 Essential (primary) hypertension: Secondary | ICD-10-CM | POA: Diagnosis not present

## 2019-05-26 DIAGNOSIS — Z5111 Encounter for antineoplastic chemotherapy: Secondary | ICD-10-CM | POA: Diagnosis not present

## 2019-05-26 DIAGNOSIS — Z79899 Other long term (current) drug therapy: Secondary | ICD-10-CM | POA: Diagnosis not present

## 2019-05-26 DIAGNOSIS — C7951 Secondary malignant neoplasm of bone: Secondary | ICD-10-CM

## 2019-05-26 DIAGNOSIS — Z515 Encounter for palliative care: Secondary | ICD-10-CM | POA: Diagnosis not present

## 2019-05-26 DIAGNOSIS — C61 Malignant neoplasm of prostate: Secondary | ICD-10-CM | POA: Diagnosis not present

## 2019-05-26 DIAGNOSIS — I2510 Atherosclerotic heart disease of native coronary artery without angina pectoris: Secondary | ICD-10-CM | POA: Diagnosis not present

## 2019-05-26 DIAGNOSIS — Z9079 Acquired absence of other genital organ(s): Secondary | ICD-10-CM | POA: Diagnosis not present

## 2019-05-26 DIAGNOSIS — R110 Nausea: Secondary | ICD-10-CM | POA: Diagnosis not present

## 2019-05-26 DIAGNOSIS — G893 Neoplasm related pain (acute) (chronic): Secondary | ICD-10-CM | POA: Diagnosis not present

## 2019-05-26 LAB — CBC WITH DIFFERENTIAL/PLATELET
Abs Immature Granulocytes: 0.15 10*3/uL — ABNORMAL HIGH (ref 0.00–0.07)
Basophils Absolute: 0 10*3/uL (ref 0.0–0.1)
Basophils Relative: 0 %
Eosinophils Absolute: 0 10*3/uL (ref 0.0–0.5)
Eosinophils Relative: 0 %
HCT: 36.4 % — ABNORMAL LOW (ref 39.0–52.0)
Hemoglobin: 11.6 g/dL — ABNORMAL LOW (ref 13.0–17.0)
Immature Granulocytes: 2 %
Lymphocytes Relative: 22 %
Lymphs Abs: 1.9 10*3/uL (ref 0.7–4.0)
MCH: 30.6 pg (ref 26.0–34.0)
MCHC: 31.9 g/dL (ref 30.0–36.0)
MCV: 96 fL (ref 80.0–100.0)
Monocytes Absolute: 0.9 10*3/uL (ref 0.1–1.0)
Monocytes Relative: 11 %
Neutro Abs: 5.7 10*3/uL (ref 1.7–7.7)
Neutrophils Relative %: 65 %
Platelets: 191 10*3/uL (ref 150–400)
RBC: 3.79 MIL/uL — ABNORMAL LOW (ref 4.22–5.81)
RDW: 16.8 % — ABNORMAL HIGH (ref 11.5–15.5)
WBC: 8.6 10*3/uL (ref 4.0–10.5)
nRBC: 0 % (ref 0.0–0.2)

## 2019-05-26 LAB — COMPREHENSIVE METABOLIC PANEL
ALT: 23 U/L (ref 0–44)
AST: 20 U/L (ref 15–41)
Albumin: 3.7 g/dL (ref 3.5–5.0)
Alkaline Phosphatase: 31 U/L — ABNORMAL LOW (ref 38–126)
Anion gap: 8 (ref 5–15)
BUN: 20 mg/dL (ref 8–23)
CO2: 28 mmol/L (ref 22–32)
Calcium: 9.4 mg/dL (ref 8.9–10.3)
Chloride: 101 mmol/L (ref 98–111)
Creatinine, Ser: 0.89 mg/dL (ref 0.61–1.24)
GFR calc Af Amer: >60 mL/min (ref >60)
GFR calc non Af Amer: >60 mL/min (ref >60)
Glucose, Bld: 110 mg/dL — ABNORMAL HIGH (ref 70–99)
Potassium: 3.3 mmol/L — ABNORMAL LOW (ref 3.5–5.1)
Sodium: 137 mmol/L (ref 135–145)
Total Bilirubin: 0.5 mg/dL (ref 0.3–1.2)
Total Protein: 6.4 g/dL — ABNORMAL LOW (ref 6.5–8.1)

## 2019-05-26 LAB — PSA: Prostatic Specific Antigen: 129.99 ng/mL — ABNORMAL HIGH (ref 0.00–4.00)

## 2019-05-26 NOTE — Progress Notes (Signed)
Virtual Visit via Telephone Note  I connected with Robert Short on 05/26/19 at 10:30 AM EDT by telephone and verified that I am speaking with the correct person using two identifiers.   I discussed the limitations, risks, security and privacy concerns of performing an evaluation and management service by telephone and the availability of in person appointments. I also discussed with the patient that there may be a patient responsible charge related to this service. The patient expressed understanding and agreed to proceed.   History of Present Illness: Mr. Robert Short is a 69 y.o.malewith multiple medical problems including stage IV prostate cancer metastatic to bone status post Xtandi.Currently on treatment with docetaxel.  Patient has had multiple symptoms including fatigue and pain. He was referred to palliative care to help with symptom management and to address goals.   Observations/Objective: I called and spoke with patient by phone.  He reports doing well.  Denies any significant changes or concerns.  He reports stable symptoms including pain.  Good oral intake.  No changes in performance status.  No need for refills.  Assessment and Plan: Metastatic prostate cancer -on systemic treatment.  Followed by Dr. Rogue Bussing.  Seems to be tolerating well.  No significant issues or symptoms today.  Follow Up Instructions: Follow-up virtual visit in 2 months   I discussed the assessment and treatment plan with the patient. The patient was provided an opportunity to ask questions and all were answered. The patient agreed with the plan and demonstrated an understanding of the instructions.   The patient was advised to call back or seek an in-person evaluation if the symptoms worsen or if the condition fails to improve as anticipated.  I provided 5 minutes of non-face-to-face time during this encounter.   Irean Hong, NP

## 2019-05-27 ENCOUNTER — Inpatient Hospital Stay: Payer: 59

## 2019-05-27 ENCOUNTER — Inpatient Hospital Stay (HOSPITAL_BASED_OUTPATIENT_CLINIC_OR_DEPARTMENT_OTHER): Payer: 59 | Admitting: Internal Medicine

## 2019-05-27 ENCOUNTER — Encounter: Payer: Self-pay | Admitting: Internal Medicine

## 2019-05-27 DIAGNOSIS — Z5111 Encounter for antineoplastic chemotherapy: Secondary | ICD-10-CM | POA: Diagnosis not present

## 2019-05-27 DIAGNOSIS — Z7189 Other specified counseling: Secondary | ICD-10-CM

## 2019-05-27 DIAGNOSIS — C61 Malignant neoplasm of prostate: Secondary | ICD-10-CM

## 2019-05-27 DIAGNOSIS — Z79899 Other long term (current) drug therapy: Secondary | ICD-10-CM | POA: Diagnosis not present

## 2019-05-27 DIAGNOSIS — I2510 Atherosclerotic heart disease of native coronary artery without angina pectoris: Secondary | ICD-10-CM | POA: Diagnosis not present

## 2019-05-27 DIAGNOSIS — G893 Neoplasm related pain (acute) (chronic): Secondary | ICD-10-CM | POA: Diagnosis not present

## 2019-05-27 DIAGNOSIS — I25118 Atherosclerotic heart disease of native coronary artery with other forms of angina pectoris: Secondary | ICD-10-CM

## 2019-05-27 DIAGNOSIS — I10 Essential (primary) hypertension: Secondary | ICD-10-CM | POA: Diagnosis not present

## 2019-05-27 DIAGNOSIS — C7951 Secondary malignant neoplasm of bone: Secondary | ICD-10-CM | POA: Diagnosis not present

## 2019-05-27 DIAGNOSIS — R110 Nausea: Secondary | ICD-10-CM | POA: Diagnosis not present

## 2019-05-27 DIAGNOSIS — Z9079 Acquired absence of other genital organ(s): Secondary | ICD-10-CM | POA: Diagnosis not present

## 2019-05-27 MED ORDER — HEPARIN SOD (PORK) LOCK FLUSH 100 UNIT/ML IV SOLN
500.0000 [IU] | Freq: Once | INTRAVENOUS | Status: DC | PRN
  Filled 2019-05-27: qty 5

## 2019-05-27 MED ORDER — HEPARIN SOD (PORK) LOCK FLUSH 100 UNIT/ML IV SOLN
INTRAVENOUS | Status: AC
  Filled 2019-05-27: qty 5

## 2019-05-27 MED ORDER — SODIUM CHLORIDE 0.9 % IV SOLN
36.0000 mg/m2 | Freq: Once | INTRAVENOUS | Status: AC
  Administered 2019-05-27: 70 mg via INTRAVENOUS
  Filled 2019-05-27: qty 7

## 2019-05-27 MED ORDER — DEXAMETHASONE SODIUM PHOSPHATE 10 MG/ML IJ SOLN
10.0000 mg | Freq: Once | INTRAMUSCULAR | Status: AC
  Administered 2019-05-27: 10 mg via INTRAVENOUS
  Filled 2019-05-27: qty 1

## 2019-05-27 MED ORDER — SODIUM CHLORIDE 0.9 % IV SOLN
Freq: Once | INTRAVENOUS | Status: AC
  Filled 2019-05-27: qty 250

## 2019-05-27 NOTE — Progress Notes (Signed)
[name]  Patient Care Team: Robert Hauser, DO as PCP - General (Family Medicine) Robert Short as PCP - Cardiology (Cardiology) Robert Merritts, Short as Consulting Physician (Cardiology) Robert Sickle, Short as Consulting Physician (Internal Medicine)  Cancer Staging No matching staging information was found for the patient.   Oncology History Overview Note  # 2011- PROSTATE CANCER [Gleason 3+4]; Robert Short Prostatectomy [ also involved bladder neck/ECP; Dr.Polaseck]; July 2014- Biochemical recurrence [PSA 14]- started on Zoladex [Dr.Pandit]; lost to follow up.  # JAN 2017- STAGE IV METASTATIC PROSTATE Cancer to Bone- Feb 13th, 2017-  Lupron q 57m[~end of feb]; PSA: 1021; Declined Chemo; April 2017 [xofigo x6; Dr.Crystal]; AUG 2017- Zytiga + Prednisone BID. Bone scan-Jan 2018- improved skeletal metastases.   # MAY 1st 2019- START X-tandi [stopped zytiga/PSA- 8.8/rising]  #August 23, 2018-stop Xtandi [intolerance-fatigue mental fogginess]  #Mid September 2020-apalutamide; stopped mid-October poor tolerance/progression  # 11/01/2018- -Taxotere weekly [consent]  # Mets to bone- start X-geva q 33M [May 30th]; hold dental issues/infections  # Smoker/ chronic pain/pain clinic   #[April 2018; [place]]- Robert Short; on Plavix; --------------------------------------------------------------  DIAGNOSIS: _0  Castrate resistant prostate cancer  STAGE:   4    ;GOALS: Palliative  CURRENT/MOST RECENT THERAPY-Late Oct 2020- Taxotere weekly [consent]    Prostate cancer metastatic to bone (Robert Short Initial Diagnosis   Prostate cancer metastatic to bone (Robert Short   11/01/2018 -  Chemotherapy   The patient had DOCEtaxel (TAXOTERE) 70 mg in sodium chloride 0.9 % 150 mL chemo infusion, 36 mg/m2 = 70 mg, Intravenous,  Once, 16 of 19 cycles Administration: 70 mg (11/08/2018), 70 mg (11/22/2018), 70 mg (12/13/2018), 70 mg (12/06/2018),  70 mg (12/31/2018), 70 mg (01/07/2019), 70 mg (01/29/2019), 70 mg (12/20/2018), 70 mg (02/06/2019), 70 mg (02/14/2019), 70 mg (02/28/2019), 70 mg (03/14/2019), 70 mg (03/28/2019), 70 mg (04/15/2019), 70 mg (04/29/2019)  for chemotherapy treatment.     INTERVAL HISTORY:  Robert [id number].  male pleasant patient above history of metastatic castrate resistant prostate cancer currently -on Taxotere weekly is here for follow-up.  Patient did not receive chemotherapy 2 weeks ago because of feeling poorly/extreme fatigue.  Today he feels improved.  Is back to his baseline health.  Chronic back pain chronic joint pains.  Review of Systems  Constitutional: Positive for malaise/fatigue and weight loss. Negative for chills, diaphoresis and fever.  HENT: Negative for nosebleeds and sore throat.   Eyes: Negative for double vision.  Respiratory: Negative for cough, hemoptysis, sputum production and wheezing.   Cardiovascular: Negative for chest pain, palpitations, orthopnea and leg swelling.  Gastrointestinal: Positive for nausea. Negative for abdominal pain, blood in stool, diarrhea, heartburn, melena and vomiting.  Genitourinary: Negative for dysuria, frequency and urgency.  Musculoskeletal: Positive for back pain and joint pain.  Skin: Negative for itching.  Neurological: Negative for dizziness, tingling, focal weakness, weakness and headaches.  Endo/Heme/Allergies: Does not bruise/bleed easily.  Psychiatric/Behavioral: Negative for depression. The patient is not nervous/anxious and does not have insomnia.       PAST MEDICAL HISTORY :  Past Medical History:  Diagnosis Date  . Back pain 10/09/2012  . [place]   . CAD (coronary artery disease)    a. 08/2015 PCI: LM nl, LAD 20d, LCX nl, RCA 229mRPAV 95 (3.0x18 Xience Alpine DES);  b. [facility] 95 (2.75x8 Promus Premier DES).  . Cancer associated pain   . Depression   .  History of echocardiogram    a. 08/2016 Echo: EF 50-55%.   Marland Kitchen History of kidney stones   . Hyperlipidemia   . Hypertension   . Joint pain   . Prostate cancer [facility])    a.  Robert Short prostatectomy ([facility]);  b. bone mets noted 04/2016.  . Right arm pain 01/10/2016  . Right foot pain 01/10/2016  . Right leg pain 01/10/2016  . Tobacco abuse     PAST SURGICAL HISTORY :   Past Surgical History:  Procedure Laterality Date  . CARDIAC CATHETERIZATION     [facility]  . CARDIAC CATHETERIZATION N/A 08/16/2015   Procedure: Left Heart Cath and Coronary Angiography;  Surgeon: Robert Kida, Short;  Location: [place] [facility];  Service: Cardiovascular;  Laterality: N/A;  . CARDIAC CATHETERIZATION N/A 08/16/2015   Procedure: Coronary Stent Intervention;  Surgeon: Robert Kida, Short;  Location: [place] [facility];  Service: Cardiovascular;  Laterality: N/A;  . CERVIX SURGERY    . PROSTATECTOMY    . SPINE SURGERY    . TONSILLECTOMY    . WRIST SURGERY      FAMILY HISTORY :   Family History  Problem Relation Age of Onset  . Heart attack Mother   . Hypertension Mother   . Heart attack Father     SOCIAL HISTORY:   Social History   Tobacco Use  . Smoking status: Current Every Day Smoker    Packs/day: 0.50    Years: 40.00    Pack years: 20.00    Types: Cigarettes  . Smokeless tobacco: Current User    Types: Chew  . Tobacco comment: 1 pack every couple days   Substance Use Topics  . Alcohol use: Yes    Comment: occasionally  . Drug use: No    ALLERGIES:  is allergic to ditropan [oxybutynin].  MEDICATIONS:  Current Outpatient Medications  Medication Sig Dispense Refill  . acetaminophen (TYLENOL) 500 MG tablet Take 1,000 mg by mouth every 8 (eight) hours as needed.     Marland Kitchen albuterol (PROVENTIL HFA;VENTOLIN HFA) 108 (90 Base) MCG/ACT inhaler Inhale 1-2 puffs into the lungs every 6 (six) hours as needed for wheezing or shortness of breath. 1 Inhaler 0  . aspirin 81 MG tablet Take 81 mg by mouth daily.    Marland Kitchen atorvastatin (LIPITOR) 80 MG tablet Take 1  tablet (80 mg total) by mouth daily at 6 PM. 90 tablet 3  . chlorhexidine (PERIDEX) 0.12 % solution     . clopidogrel (PLAVIX) 75 MG tablet Take 1 tablet (75 mg total) by mouth daily. 90 tablet 3  . dexamethasone (DECADRON) 4 MG tablet Take 4 mg by mouth daily. Take for 3 days with chemotherapy    . fentaNYL (DURAGESIC) 100 MCG/HR Place 1 patch onto the skin every other day. Along with 75mg patch for total of 125 mcg every 2 days. 15 patch 0  . fentaNYL (DURAGESIC) 25 MCG/HR Place 1 patch onto the skin every other day. Use this along with fentanyl patch 100 mcg [total 125 mcg] every 2 days 15 patch 0  . gabapentin (NEURONTIN) 100 MG capsule Take 1 capsule (100 mg total) by mouth 3 (three) times daily. 90 capsule 5  . magnesium hydroxide (MILK OF MAGNESIA) 400 MG/5ML suspension Take 5 mLs by mouth daily as needed for constipation.    . metoprolol tartrate (LOPRESSOR) 25 MG tablet TAKE 1/2 TABLET BY MOUTH 2 TIMES DAILY. 90 tablet 1  . oxyCODONE (ROXICODONE) 15 MG immediate release tablet  Take 1 tablet (15 mg total) by mouth every 6 (six) hours as needed. for pain 90 tablet 0  . polyethylene glycol (MIRALAX / GLYCOLAX) packet Take 17 g by mouth daily as needed for moderate constipation.    . predniSONE (DELTASONE) 5 MG tablet Take 1 tablet (5 mg total) by mouth daily with breakfast. 30 tablet 3  . promethazine (PHENERGAN) 25 MG tablet TAKE 1/2 TO 1 TABLET (12.5-25 MG TOTAL) BY MOUTH EVERY 8 (EIGHT) HOURS AS NEEDED FOR NAUSEA OR VOMITING. 60 tablet 3  . traZODone (DESYREL) 50 MG tablet Take 1-2 tablets (50-100 mg total) by mouth at bedtime as needed for sleep. 60 tablet 2  . triamcinolone ointment (KENALOG) 0.5 % Apply 1 application topically 2 (two) times daily. 30 g 0  . Wheat Dextrin (BENEFIBER) POWD Stir 2 tsp. TID into 4-8 oz of any non-carbonated beverage or soft food (hot or cold) 500 g PRN  . naloxone (NARCAN) nasal spray 4 mg/0.1 mL 1 spray into nostril upon signs of opioid overdose. Call 911.  May repeat once if no response within 2-3 minutes. (Patient not taking: Reported on 05/13/2019) 1 kit 0  . nitroGLYCERIN (NITROSTAT) 0.4 MG SL tablet Place 1 tablet (0.4 mg total) under the tongue every 5 (five) minutes as needed. (Patient not taking: Reported on 05/13/2019) 25 tablet 3  . ondansetron (ZOFRAN) 8 MG tablet One pill every 8 hours as needed for nausea/vomitting. (Patient not taking: Reported on 05/13/2019) 40 tablet 1   No current facility-administered medications for this visit.    PHYSICAL EXAMINATION: ECOG PERFORMANCE STATUS: 1 - Symptomatic but completely ambulatory  BP (!) 158/98   Pulse 99   Temp 97.8 F (36.6 C) (Oral)   Resp 18   Ht '5\' 11"'$  (1.803 m)   Wt 164 lb (74.4 kg)   BMI 22.87 kg/m   Filed Weights   05/27/19 0956  Weight: 164 lb (74.4 kg)    Physical Exam  Constitutional: He is oriented to person, place, and time.   Frail-appearing Caucasian male patient.  He is in a walking with a cane.  Alone.  HENT:  Head: Normocephalic and atraumatic.  Mouth/Throat: Oropharynx is clear and moist. No oropharyngeal exudate.  Poor dentition.  Eyes: Pupils are equal, round, and reactive to light.  Cardiovascular: Normal rate and regular rhythm.  Pulmonary/Chest: No respiratory distress. He has no wheezes.  Decreased breath sounds bilaterally at the bases.  No wheeze or crackles  Abdominal: Soft. Bowel sounds are normal. He exhibits no distension and no mass. There is no abdominal tenderness. There is no rebound and no guarding.  Musculoskeletal:        General: No tenderness or edema. Normal range of motion.     Cervical back: Normal range of motion and neck supple.  Neurological: He is alert and oriented to person, place, and time.  Skin: Skin is warm.  Macular rash on hands  Psychiatric: Affect normal.    LABORATORY DATA:  I have reviewed the data as listed    Component Value Date/Time   NA 137 05/26/2019 1306   NA 140 07/13/2013 1542   K 3.3 (L)  05/26/2019 1306   K 4.3 07/13/2013 1542   CL 101 05/26/2019 1306   CL 107 07/13/2013 1542   CO2 28 05/26/2019 1306   CO2 26 07/13/2013 1542   GLUCOSE 110 (H) 05/26/2019 1306   GLUCOSE 106 (H) 07/13/2013 1542   BUN 20 05/26/2019 1306   BUN 11 07/13/2013 1542  CREATININE 0.89 05/26/2019 1306   CREATININE 0.76 10/15/2017 1001   CALCIUM 9.4 05/26/2019 1306   CALCIUM 9.9 07/13/2013 1542   PROT 6.4 (L) 05/26/2019 1306   PROT 8.1 07/13/2013 1542   ALBUMIN 3.7 05/26/2019 1306   ALBUMIN 4.3 07/13/2013 1542   AST 20 05/26/2019 1306   AST 26 07/13/2013 1542   ALT 23 05/26/2019 1306   ALT 32 07/13/2013 1542   ALKPHOS 31 (L) 05/26/2019 1306   ALKPHOS 74 07/13/2013 1542   BILITOT 0.5 05/26/2019 1306   BILITOT 0.4 07/13/2013 1542   GFRNONAA >60 05/26/2019 1306   GFRNONAA 95 10/15/2017 1001   GFRAA >60 05/26/2019 1306   GFRAA 110 10/15/2017 1001    No results found for: SPEP, UPEP  Lab Results  Component Value Date   WBC 8.6 05/26/2019   NEUTROABS 5.7 05/26/2019   HGB 11.6 (L) 05/26/2019   HCT 36.4 (L) 05/26/2019   MCV 96.0 05/26/2019   PLT 191 05/26/2019      Chemistry      Component Value Date/Time   NA 137 05/26/2019 1306   NA 140 07/13/2013 1542   K 3.3 (L) 05/26/2019 1306   K 4.3 07/13/2013 1542   CL 101 05/26/2019 1306   CL 107 07/13/2013 1542   CO2 28 05/26/2019 1306   CO2 26 07/13/2013 1542   BUN 20 05/26/2019 1306   BUN 11 07/13/2013 1542   CREATININE 0.89 05/26/2019 1306   CREATININE 0.76 10/15/2017 1001      Component Value Date/Time   CALCIUM 9.4 05/26/2019 1306   CALCIUM 9.9 07/13/2013 1542   ALKPHOS 31 (L) 05/26/2019 1306   ALKPHOS 74 07/13/2013 1542   AST 20 05/26/2019 1306   AST 26 07/13/2013 1542   ALT 23 05/26/2019 1306   ALT 32 07/13/2013 1542   BILITOT 0.5 05/26/2019 1306   BILITOT 0.4 07/13/2013 1542     RADIOGRAPHIC STUDIES: I have personally reviewed the radiological images as listed and agreed with the findings in the report. No  results found.   ASSESSMENT & PLAN:  Prostate cancer metastatic to bone ([place]) # Castrate resistant prostate cancer metastases to bone. Lupronq 3 M; Taxotere q 2 weeks; MAY 2021- PET- Improved; PSA- rising 70 to 130.   # proceed with Taxotere; Labs today reviewed;  acceptable for treatment today.  Reviewed that the PSA is going up; given his interruption with chemotherapy.   # Nausea without vomiting- STABLE.   # Malignancy related pain- continue fentanyl patch hydrocodone;. Continue fentanyl patch to every 48 hours and continue oxycodone prn; STABLE.   # IV access- skin irritation sec to chemo; recommend mediport. Pt reluctant with mediport at this time.   # DISPOSITION:  # chemo; eligard # in 2 weeks- Short;  Reva Bores- cbc/cmp/PSA 1 day prior in [place];] Taxoetere;-Dr.B    Orders Placed This Encounter  Procedures  . Comprehensive metabolic panel    Standing Status:   Future    Standing Expiration Date:   05/26/2020  . CBC with Differential    Standing Status:   Future    Standing Expiration Date:   05/26/2020  . PSA    Standing Status:   Future    Standing Expiration Date:   05/26/2020   All questions were answered. The patient knows to call the clinic with any problems, questions or concerns.      Robert Sickle, Short 05/27/2019 10:23 AM

## 2019-05-27 NOTE — Assessment & Plan Note (Addendum)
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 Lupronq 3 M; Taxotere q 2 weeks; MAY 2021- PET- Improved; PSA- rising 70 to 130.   # proceed with Taxotere; Labs today reviewed;  acceptable for treatment today.  Reviewed that the PSA is going up; given his interruption with chemotherapy.   # Nausea without vomiting- STABLE.   # Malignancy related pain- continue fentanyl patch hydrocodone;. Continue fentanyl patch to every 48 hours and continue oxycodone prn; STABLE.   # IV access- skin irritation sec to chemo; recommend mediport. Pt reluctant with mediport at this time.   # DISPOSITION:  # chemo; eligard # in 2 weeks- MD;  Reva Bores- cbc/cmp/PSA 1 day prior in [place];] Taxoetere;-Dr.B

## 2019-06-03 NOTE — Progress Notes (Signed)
Pharmacist Chemotherapy Monitoring - Follow Up Assessment    I verify that I have reviewed each item in the below checklist:  . Regimen for the patient is scheduled for the appropriate day and plan matches scheduled date. Marland Kitchen Appropriate non-routine labs are ordered dependent on drug ordered. . If applicable, additional medications reviewed and ordered per protocol based on lifetime cumulative doses and/or treatment regimen.   Plan for follow-up and/or issues identified: No . I-vent associated with next due treatment: No . MD and/or nursing notified: No  Arti Trang K 06/03/2019 9:15 AM

## 2019-06-04 ENCOUNTER — Telehealth: Payer: Self-pay | Admitting: *Deleted

## 2019-06-04 ENCOUNTER — Other Ambulatory Visit: Payer: Self-pay | Admitting: Internal Medicine

## 2019-06-04 DIAGNOSIS — G893 Neoplasm related pain (acute) (chronic): Secondary | ICD-10-CM

## 2019-06-04 DIAGNOSIS — C61 Malignant neoplasm of prostate: Secondary | ICD-10-CM

## 2019-06-04 NOTE — Telephone Encounter (Signed)
Call from [place] with [facility] stating that she is availabble to assist with any coordination of care with this patient if needed [phone] ext [phone]

## 2019-06-05 ENCOUNTER — Other Ambulatory Visit: Payer: Self-pay | Admitting: Internal Medicine

## 2019-06-05 DIAGNOSIS — C7951 Secondary malignant neoplasm of bone: Secondary | ICD-10-CM

## 2019-06-05 DIAGNOSIS — G893 Neoplasm related pain (acute) (chronic): Secondary | ICD-10-CM

## 2019-06-05 MED ORDER — OXYCODONE HCL 15 MG PO TABS: 15.0000 mg | ORAL_TABLET | Freq: Four times a day (QID) | ORAL | 0 refills | Status: DC | PRN

## 2019-06-05 MED ORDER — FENTANYL 25 MCG/HR TD PT72: 1.0000 | MEDICATED_PATCH | TRANSDERMAL | 0 refills | Status: DC

## 2019-06-05 MED ORDER — FENTANYL 100 MCG/HR TD PT72: 1.0000 | MEDICATED_PATCH | TRANSDERMAL | 0 refills | Status: DC

## 2019-06-09 ENCOUNTER — Inpatient Hospital Stay: Payer: 59 | Attending: Hematology and Oncology

## 2019-06-09 ENCOUNTER — Other Ambulatory Visit: Payer: Self-pay

## 2019-06-09 DIAGNOSIS — Z5111 Encounter for antineoplastic chemotherapy: Secondary | ICD-10-CM | POA: Insufficient documentation

## 2019-06-09 DIAGNOSIS — Z79899 Other long term (current) drug therapy: Secondary | ICD-10-CM | POA: Insufficient documentation

## 2019-06-09 DIAGNOSIS — C61 Malignant neoplasm of prostate: Secondary | ICD-10-CM | POA: Diagnosis not present

## 2019-06-09 DIAGNOSIS — L723 Sebaceous cyst: Secondary | ICD-10-CM | POA: Insufficient documentation

## 2019-06-09 DIAGNOSIS — G893 Neoplasm related pain (acute) (chronic): Secondary | ICD-10-CM | POA: Insufficient documentation

## 2019-06-09 DIAGNOSIS — C7951 Secondary malignant neoplasm of bone: Secondary | ICD-10-CM | POA: Diagnosis not present

## 2019-06-09 DIAGNOSIS — L089 Local infection of the skin and subcutaneous tissue, unspecified: Secondary | ICD-10-CM | POA: Diagnosis not present

## 2019-06-09 LAB — COMPREHENSIVE METABOLIC PANEL
ALT: 12 U/L (ref 0–44)
AST: 15 U/L (ref 15–41)
Albumin: 3.8 g/dL (ref 3.5–5.0)
Alkaline Phosphatase: 35 U/L — ABNORMAL LOW (ref 38–126)
Anion gap: 8 (ref 5–15)
BUN: 10 mg/dL (ref 8–23)
CO2: 26 mmol/L (ref 22–32)
Calcium: 9.5 mg/dL (ref 8.9–10.3)
Chloride: 102 mmol/L (ref 98–111)
Creatinine, Ser: 1.05 mg/dL (ref 0.61–1.24)
GFR calc Af Amer: >60 mL/min (ref >60)
GFR calc non Af Amer: >60 mL/min (ref >60)
Glucose, Bld: 142 mg/dL — ABNORMAL HIGH (ref 70–99)
Potassium: 4.2 mmol/L (ref 3.5–5.1)
Sodium: 136 mmol/L (ref 135–145)
Total Bilirubin: 0.9 mg/dL (ref 0.3–1.2)
Total Protein: 6.6 g/dL (ref 6.5–8.1)

## 2019-06-09 LAB — CBC WITH DIFFERENTIAL/PLATELET
Abs Immature Granulocytes: 0.04 10*3/uL (ref 0.00–0.07)
Basophils Absolute: 0 10*3/uL (ref 0.0–0.1)
Basophils Relative: 0 %
Eosinophils Absolute: 0 10*3/uL (ref 0.0–0.5)
Eosinophils Relative: 1 %
HCT: 34.7 % — ABNORMAL LOW (ref 39.0–52.0)
Hemoglobin: 11.1 g/dL — ABNORMAL LOW (ref 13.0–17.0)
Immature Granulocytes: 1 %
Lymphocytes Relative: 35 %
Lymphs Abs: 1.6 10*3/uL (ref 0.7–4.0)
MCH: 30.4 pg (ref 26.0–34.0)
MCHC: 32 g/dL (ref 30.0–36.0)
MCV: 95.1 fL (ref 80.0–100.0)
Monocytes Absolute: 0.8 10*3/uL (ref 0.1–1.0)
Monocytes Relative: 17 %
Neutro Abs: 2.1 10*3/uL (ref 1.7–7.7)
Neutrophils Relative %: 46 %
Platelets: 202 10*3/uL (ref 150–400)
RBC: 3.65 MIL/uL — ABNORMAL LOW (ref 4.22–5.81)
RDW: 16.2 % — ABNORMAL HIGH (ref 11.5–15.5)
WBC: 4.5 10*3/uL (ref 4.0–10.5)
nRBC: 0 % (ref 0.0–0.2)

## 2019-06-09 LAB — PSA: Prostatic Specific Antigen: 146.66 ng/mL — ABNORMAL HIGH (ref 0.00–4.00)

## 2019-06-10 ENCOUNTER — Inpatient Hospital Stay (HOSPITAL_BASED_OUTPATIENT_CLINIC_OR_DEPARTMENT_OTHER): Payer: 59 | Admitting: Internal Medicine

## 2019-06-10 ENCOUNTER — Inpatient Hospital Stay: Payer: 59

## 2019-06-10 ENCOUNTER — Ambulatory Visit: Payer: 59 | Admitting: Internal Medicine

## 2019-06-10 ENCOUNTER — Telehealth: Payer: Self-pay | Admitting: *Deleted

## 2019-06-10 ENCOUNTER — Encounter: Payer: Self-pay | Admitting: Internal Medicine

## 2019-06-10 VITALS — BP 143/89 | HR 76 | Temp 96.7°F | Resp 18 | Wt 161.6 lb

## 2019-06-10 DIAGNOSIS — G893 Neoplasm related pain (acute) (chronic): Secondary | ICD-10-CM | POA: Diagnosis not present

## 2019-06-10 DIAGNOSIS — C61 Malignant neoplasm of prostate: Secondary | ICD-10-CM

## 2019-06-10 DIAGNOSIS — L723 Sebaceous cyst: Secondary | ICD-10-CM

## 2019-06-10 DIAGNOSIS — C7951 Secondary malignant neoplasm of bone: Secondary | ICD-10-CM | POA: Diagnosis not present

## 2019-06-10 DIAGNOSIS — I25118 Atherosclerotic heart disease of native coronary artery with other forms of angina pectoris: Secondary | ICD-10-CM

## 2019-06-10 DIAGNOSIS — Z79899 Other long term (current) drug therapy: Secondary | ICD-10-CM | POA: Diagnosis not present

## 2019-06-10 DIAGNOSIS — L089 Local infection of the skin and subcutaneous tissue, unspecified: Secondary | ICD-10-CM | POA: Diagnosis not present

## 2019-06-10 DIAGNOSIS — Z5111 Encounter for antineoplastic chemotherapy: Secondary | ICD-10-CM | POA: Diagnosis not present

## 2019-06-10 MED ORDER — AMOXICILLIN 500 MG PO CAPS
500.0000 mg | ORAL_CAPSULE | Freq: Three times a day (TID) | ORAL | 0 refills | Status: DC
Start: 2019-06-10 — End: 2019-07-09

## 2019-06-10 NOTE — Telephone Encounter (Signed)
Referral faxed to Dr. Dwyane Luo office at [phone]

## 2019-06-10 NOTE — Assessment & Plan Note (Addendum)
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 Lupronq 3 M; Taxotere q 2 weeks; MAY 2021- PET- Improved; however PSA- rising 70 to 140s.    #Hold Taxotere- [see infected sebaceous cyst]; discussed regarding rising PSA; which means resistance to Taxotere.  However since patient is clinically stable/recent PET scan improved; recommend continue Taxotere.  Discussed the next step would be cabazitaxel.  # "confused"-post? Eligard [on May 28th]; currently resolved.  Would recommend switching over to [place].  # Nausea without vomiting-stable  # Malignancy related pain- continue fentanyl patch hydrocodone;. Continue fentanyl patch to every 48 hours and continue oxycodone prn; stable  # infected sebaceous cyst- recommend Amoxicllin; refer to Dr.Byrnett.  Inform Dr. Tollie Pizza.  # DISPOSITION:  # refer to Dr.Byrnett [infected sebaceous cyst] # HOLD Chemo # in 2 weeks- MD;  Reva Bores- cbc/cmp/PSA 1 day prior in [place];] Taxoetere;-Dr.B

## 2019-06-10 NOTE — Progress Notes (Signed)
Robert Short OFFICE PROGRESS NOTE  Patient Care Team: Robert Hauser, DO as PCP - General (Family Medicine) Robert Short Robert November, MD as PCP - Cardiology (Cardiology) Robert Merritts, MD as Consulting Physician (Cardiology) Robert Sickle, [name]  Cancer Staging No matching staging information was found for the patient.   Oncology History Overview Note  # 2011- PROSTATE CANCER [Gleason 3+4]; s/p Prostatectomy [ also involved bladder neck/ECP; Dr.Polaseck]; July 2014- Biochemical recurrence [PSA 14]- started on Zoladex [Dr.Pandit]; lost to follow up.  # JAN 2017- STAGE IV METASTATIC PROSTATE Cancer to Bone- Feb 13th, 2017-  Lupron q 82m[~end of feb]; PSA: 1021; Declined Chemo; April 2017 [xofigo x6; Dr.Crystal]; AUG 2017- Zytiga + Prednisone BID. Bone scan-Jan 2018- improved skeletal metastases.   # MAY 1st 2019- START X-tandi [stopped zytiga/PSA- 8.8/rising]  #August 23, 2018-stop Xtandi [intolerance-fatigue mental fogginess]  #Mid September 2020-apalutamide; stopped mid-October poor tolerance/progression  # 11/01/2018- -Taxotere weekly [consent]  # Mets to bone- start X-geva q 33M [May 30th]; hold dental issues/infections  # Smoker/ chronic pain/pain clinic   #[April 2018; [place]]- s/p stenting; on Plavix; --------------------------------------------------------------  DIAGNOSIS: '[ ]'  Castrate resistant prostate cancer  STAGE:   4    ;GOALS: Palliative  CURRENT/MOST RECENT THERAPY-Late Oct 2020- Taxotere weekly [consent]    [place] ([place]  12/08/2014 Initial Diagnosis   [place] cancer [place] to bone ([facility]   11/01/2018 -  Chemotherapy   The patient had DOCEtaxel (TAXOTERE) 70 mg in sodium chloride 0.9 % 150 mL chemo infusion, 36 mg/m2 = 70 mg, Intravenous,  Once, 17 of 19 cycles Administration: 70 mg (11/08/2018), 70 mg (11/22/2018), 70 mg (12/13/2018), 70 mg (12/06/2018),  70 mg (12/31/2018), 70 mg (01/07/2019), 70 mg (01/29/2019), 70 mg (12/20/2018), 70 mg (02/06/2019), 70 mg (02/14/2019), 70 mg (02/28/2019), 70 mg (03/14/2019), 70 mg (03/28/2019), 70 mg (04/15/2019), 70 mg (04/29/2019), 70 mg (05/27/2019)  for chemotherapy treatment.     INTERVAL HISTORY:  Robert SEDGWICK89y.o.  male pleasant patient above history of metastatic castrate resistant prostate cancer currently -on Taxotere weekly is here for follow-up.  In the interim patient noted to have a boil on his back approximately 3 days ago.  Starting to hurt/tender to touch warm to touch.  Continues to have chronic back pain.  Chronic joint pains.  Not any worse.  Of note patient had episodes of "confusion" after last Eligard injection.  His wife states he was quite confused for the next 2 days.  Currently back to baseline.  No focal deficits.  No hospitalizations.  Review of Systems  Constitutional: Positive for malaise/fatigue and weight loss. Negative for chills, diaphoresis and fever.  HENT: Negative for nosebleeds and sore throat.   Eyes: Negative for double vision.  Respiratory: Negative for cough, hemoptysis, sputum production and wheezing.   Cardiovascular: Negative for chest pain, palpitations, orthopnea and leg swelling.  Gastrointestinal: Positive for nausea. Negative for abdominal pain, blood in stool, diarrhea, heartburn, melena and vomiting.  Genitourinary: Negative for dysuria, frequency and urgency.  Musculoskeletal: Positive for back pain and joint pain.  Skin: Negative for itching.  Neurological: Negative for dizziness, tingling, focal weakness, weakness and headaches.  Endo/Heme/Allergies: Does not bruise/bleed easily.  Psychiatric/Behavioral: Negative for depression. The patient is not nervous/anxious and does not have insomnia.       PAST MEDICAL HISTORY :  Past Medical History:  Diagnosis Date  . Back pain 10/09/2012  . Bone cancer (  [place])   . CAD (coronary artery disease)    a. 08/2015  PCI: LM nl, LAD 20d, LCX nl, RCA 39m RPAV 95 (3.0x18 Xience Alpine DES);  b. 04/2016 PCI ([facility]: RPL 95 (2.75x8 Promus Premier DES).  . Cancer associated pain   . Depression   . History of echocardiogram    a. 08/2016 Echo: EF 50-55%.  .Marland KitchenHistory of kidney stones   . Hyperlipidemia   . Hypertension   . Joint pain   . Prostate cancer ([facility]    a.  s/p prostatectomy ([facility]);  b. bone mets noted 04/2016.  . Right arm pain 01/10/2016  . Right foot pain 01/10/2016  . Right leg pain 01/10/2016  . Tobacco abuse     PAST SURGICAL HISTORY :   Past Surgical History:  Procedure Laterality Date  . CARDIAC CATHETERIZATION     [facility]  . CARDIAC CATHETERIZATION N/A 08/16/2015   Procedure: Left Heart Cath and Coronary Angiography;  Surgeon: DYolonda Kida MD;  [facility];  Service: Cardiovascular;  Laterality: N/A;  . CARDIAC CATHETERIZATION N/A 08/16/2015   Procedure: Coronary Stent Intervention;  Surgeon: DYolonda Kida MD;  [facility];  Service: Cardiovascular;  Laterality: N/A;  . CERVIX SURGERY    . PROSTATECTOMY    . SPINE SURGERY    . TONSILLECTOMY    . WRIST SURGERY      FAMILY HISTORY :   Family History  Problem Relation Age of Onset  . Heart attack Mother   . Hypertension Mother   . Heart attack Father     SOCIAL HISTORY:   Social History   Tobacco Use  . Smoking status: Current Every Day Smoker    Packs/day: 0.50    Years: 40.00    Pack years: 20.00    Types: Cigarettes  . Smokeless tobacco: Current User    Types: Chew  . Tobacco comment: 1 pack every couple days   Substance Use Topics  . Alcohol use: Yes    Comment: occasionally  . Drug use: No    ALLERGIES:  is allergic to ditropan [oxybutynin].  MEDICATIONS:  Current Outpatient Medications  Medication Sig Dispense Refill  . acetaminophen (TYLENOL) 500 MG tablet Take 1,000 mg by mouth every 8 (eight) hours as needed.     .Marland Kitchenalbuterol (PROVENTIL HFA;VENTOLIN HFA)  108 (90 Base) MCG/ACT inhaler Inhale 1-2 puffs into the lungs every 6 (six) hours as needed for wheezing or shortness of breath. 1 Inhaler 0  . aspirin 81 MG tablet Take 81 mg by mouth daily.    .Marland Kitchenatorvastatin (LIPITOR) 80 MG tablet Take 1 tablet (80 mg total) by mouth daily at 6 PM. 90 tablet 3  . chlorhexidine (PERIDEX) 0.12 % solution     . clopidogrel (PLAVIX) 75 MG tablet Take 1 tablet (75 mg total) by mouth daily. 90 tablet 3  . dexamethasone (DECADRON) 4 MG tablet Take 4 mg by mouth daily. Take for 3 days with chemotherapy    . fentaNYL (DURAGESIC) 100 MCG/HR Place 1 patch onto the skin every other day. Along with 229m patch for total of 125 mcg every 2 days. 15 patch 0  . fentaNYL (DURAGESIC) 25 MCG/HR Place 1 patch onto the skin every other day. Use this along with fentanyl patch 100 mcg [total 125 mcg] every 2 days 15 patch 0  . gabapentin (NEURONTIN) 100 MG capsule Take 1 capsule (100 mg total) by mouth 3 (three) times daily. 90 capsule  5  . magnesium hydroxide (MILK OF MAGNESIA) 400 MG/5ML suspension Take 5 mLs by mouth daily as needed for constipation.    . metoprolol tartrate (LOPRESSOR) 25 MG tablet TAKE 1/2 TABLET BY MOUTH 2 TIMES DAILY. 90 tablet 1  . ondansetron (ZOFRAN) 8 MG tablet One pill every 8 hours as needed for nausea/vomitting. 40 tablet 1  . oxyCODONE (ROXICODONE) 15 MG immediate release tablet Take 1 tablet (15 mg total) by mouth every 6 (six) hours as needed. for pain 90 tablet 0  . polyethylene glycol (MIRALAX / GLYCOLAX) packet Take 17 g by mouth daily as needed for moderate constipation.    . predniSONE (DELTASONE) 5 MG tablet Take 1 tablet (5 mg total) by mouth daily with breakfast. 30 tablet 3  . promethazine (PHENERGAN) 25 MG tablet TAKE 1/2 TO 1 TABLET (12.5-25 MG TOTAL) BY MOUTH EVERY 8 (EIGHT) HOURS AS NEEDED FOR NAUSEA OR VOMITING. 60 tablet 3  . traZODone (DESYREL) 50 MG tablet Take 1-2 tablets (50-100 mg total) by mouth at bedtime as needed for sleep. 60  tablet 2  . triamcinolone ointment (KENALOG) 0.5 % Apply 1 application topically 2 (two) times daily. 30 g 0  . Wheat Dextrin (BENEFIBER) POWD Stir 2 tsp. TID into 4-8 oz of any non-carbonated beverage or soft food (hot or cold) 500 g PRN  . amoxicillin (AMOXIL) 500 MG capsule Take 1 capsule (500 mg total) by mouth 3 (three) times daily. 30 capsule 0  . naloxone (NARCAN) nasal spray 4 mg/0.1 mL 1 spray into nostril upon signs of opioid overdose. Call 911. May repeat once if no response within 2-3 minutes. (Patient not taking: Reported on 05/13/2019) 1 kit 0  . nitroGLYCERIN (NITROSTAT) 0.4 MG SL tablet Place 1 tablet (0.4 mg total) under the tongue every 5 (five) minutes as needed. (Patient not taking: Reported on 05/13/2019) 25 tablet 3   No current facility-administered medications for this visit.    PHYSICAL EXAMINATION: ECOG PERFORMANCE STATUS: 1 - Symptomatic but completely ambulatory  BP (!) 143/89 (BP Location: Left Arm, Patient Position: Sitting)   Pulse 76   Temp (!) 96.7 F (35.9 C) (Tympanic)   Resp 18   Wt 161 lb 9.6 oz (73.3 kg)   BMI 22.54 kg/m   Filed Weights   06/10/19 1129  Weight: 161 lb 9.6 oz (73.3 kg)    Physical Exam  Constitutional: He is oriented to person, place, and time.   Frail-appearing Caucasian male patient.  He is in a walking with a cane.  Alone.  HENT:  Head: Normocephalic and atraumatic.  Mouth/Throat: Oropharynx is clear and moist. No oropharyngeal exudate.  Poor dentition.  Eyes: Pupils are equal, round, and reactive to light.  Cardiovascular: Normal rate and regular rhythm.  Pulmonary/Chest: No respiratory distress. He has no wheezes.  Decreased breath sounds bilaterally at the bases.  No wheeze or crackles  Abdominal: Soft. Bowel sounds are normal. He exhibits no distension and no mass. There is no abdominal tenderness. There is no rebound and no guarding.  Musculoskeletal:        General: No tenderness or edema. Normal range of motion.      Cervical back: Normal range of motion and neck supple.  Neurological: He is alert and oriented to person, place, and time.  Skin: Skin is warm.  Macular rash on hands  Psychiatric: Affect normal.    LABORATORY DATA:  I have reviewed the data as listed    Component Value Date/Time   NA  136 06/09/2019 1130   NA 140 07/13/2013 1542   K 4.2 06/09/2019 1130   K 4.3 07/13/2013 1542   CL 102 06/09/2019 1130   CL 107 07/13/2013 1542   CO2 26 06/09/2019 1130   CO2 26 07/13/2013 1542   GLUCOSE 142 (H) 06/09/2019 1130   GLUCOSE 106 (H) 07/13/2013 1542   BUN 10 06/09/2019 1130   BUN 11 07/13/2013 1542   CREATININE 1.05 06/09/2019 1130   CREATININE 0.76 10/15/2017 1001   CALCIUM 9.5 06/09/2019 1130   CALCIUM 9.9 07/13/2013 1542   PROT 6.6 06/09/2019 1130   PROT 8.1 07/13/2013 1542   ALBUMIN 3.8 06/09/2019 1130   ALBUMIN 4.3 07/13/2013 1542   AST 15 06/09/2019 1130   AST 26 07/13/2013 1542   ALT 12 06/09/2019 1130   ALT 32 07/13/2013 1542   ALKPHOS 35 (L) 06/09/2019 1130   ALKPHOS 74 07/13/2013 1542   BILITOT 0.9 06/09/2019 1130   BILITOT 0.4 07/13/2013 1542   GFRNONAA >60 06/09/2019 1130   GFRNONAA 95 10/15/2017 1001   GFRAA >60 06/09/2019 1130   GFRAA 110 10/15/2017 1001    No results found for: SPEP, UPEP  Lab Results  Component Value Date   WBC 4.5 06/09/2019   NEUTROABS 2.1 06/09/2019   HGB 11.1 (L) 06/09/2019   HCT 34.7 (L) 06/09/2019   MCV 95.1 06/09/2019   PLT 202 06/09/2019      Chemistry      Component Value Date/Time   NA 136 06/09/2019 1130   NA 140 07/13/2013 1542   K 4.2 06/09/2019 1130   K 4.3 07/13/2013 1542   CL 102 06/09/2019 1130   CL 107 07/13/2013 1542   CO2 26 06/09/2019 1130   CO2 26 07/13/2013 1542   BUN 10 06/09/2019 1130   BUN 11 07/13/2013 1542   CREATININE 1.05 06/09/2019 1130   CREATININE 0.76 10/15/2017 1001      Component Value Date/Time   CALCIUM 9.5 06/09/2019 1130   CALCIUM 9.9 07/13/2013 1542   ALKPHOS 35 (L) 06/09/2019  1130   ALKPHOS 74 07/13/2013 1542   AST 15 06/09/2019 1130   AST 26 07/13/2013 1542   ALT 12 06/09/2019 1130   ALT 32 07/13/2013 1542   BILITOT 0.9 06/09/2019 1130   BILITOT 0.4 07/13/2013 1542     RADIOGRAPHIC STUDIES: I have personally reviewed the radiological images as listed and agreed with the findings in the report. No results found.   ASSESSMENT & PLAN:  Prostate cancer metastatic to bone ([place]) # Castrate resistant prostate cancer metastases to bone. Lupronq 3 M; Taxotere q 2 weeks; MAY 2021- PET- Improved; however PSA- rising 70 to 140s.    #Hold Taxotere- [see infected sebaceous cyst]; discussed regarding rising PSA; which means resistance to Taxotere.  However since patient is clinically stable/recent PET scan improved; recommend continue Taxotere.  Discussed the next step would be cabazitaxel.  # "confused"-post? Eligard [on May 28th]; currently resolved.  Would recommend switching over to [place].  # Nausea without vomiting-stable  # Malignancy related pain- continue fentanyl patch hydrocodone;. Continue fentanyl patch to every 48 hours and continue oxycodone prn; stable  # infected sebaceous cyst- recommend Amoxicllin; refer to Dr.Byrnett.  Inform Dr. Tollie Pizza.  # DISPOSITION:  # refer to Dr.Byrnett [infected sebaceous cyst] # HOLD Chemo # in 2 weeks- MD;  Reva Bores- cbc/cmp/PSA 1 day prior in [place];] Taxoetere;-Dr.B    Orders Placed This Encounter  Procedures  . Comprehensive metabolic panel    Standing  Status:   Future    Standing Expiration Date:   06/09/2020  . CBC with Differential    Standing Status:   Future    Standing Expiration Date:   06/09/2020  . PSA    Standing Status:   Future    Standing Expiration Date:   06/09/2020  . Ambulatory referral to General Surgery    Referral Priority:   Urgent    Referral Type:   Surgical    Referral Reason:   Specialty Services Required    Referred to Provider:   Robert Bellow, MD    Requested Specialty:    General Surgery    Number of Visits Requested:   1   All questions were answered. The patient knows to call the clinic with any problems, questions or concerns.      Robert Sickle, MD 06/10/2019 1:13 PM

## 2019-06-10 NOTE — Progress Notes (Signed)
Pt in for follow up. Pt has a large infected area on his right back shoulder area.

## 2019-06-12 DIAGNOSIS — L723 Sebaceous cyst: Secondary | ICD-10-CM | POA: Diagnosis not present

## 2019-06-23 ENCOUNTER — Inpatient Hospital Stay: Payer: 59

## 2019-06-23 ENCOUNTER — Other Ambulatory Visit: Payer: Self-pay

## 2019-06-23 DIAGNOSIS — C7951 Secondary malignant neoplasm of bone: Secondary | ICD-10-CM | POA: Diagnosis not present

## 2019-06-23 DIAGNOSIS — Z79899 Other long term (current) drug therapy: Secondary | ICD-10-CM | POA: Diagnosis not present

## 2019-06-23 DIAGNOSIS — C61 Malignant neoplasm of prostate: Secondary | ICD-10-CM | POA: Diagnosis not present

## 2019-06-23 DIAGNOSIS — G893 Neoplasm related pain (acute) (chronic): Secondary | ICD-10-CM | POA: Diagnosis not present

## 2019-06-23 DIAGNOSIS — Z5111 Encounter for antineoplastic chemotherapy: Secondary | ICD-10-CM | POA: Diagnosis not present

## 2019-06-23 DIAGNOSIS — L089 Local infection of the skin and subcutaneous tissue, unspecified: Secondary | ICD-10-CM | POA: Diagnosis not present

## 2019-06-23 DIAGNOSIS — L723 Sebaceous cyst: Secondary | ICD-10-CM | POA: Diagnosis not present

## 2019-06-23 LAB — COMPREHENSIVE METABOLIC PANEL
ALT: 9 U/L (ref 0–44)
AST: 14 U/L — ABNORMAL LOW (ref 15–41)
Albumin: 3.3 g/dL — ABNORMAL LOW (ref 3.5–5.0)
Alkaline Phosphatase: 35 U/L — ABNORMAL LOW (ref 38–126)
Anion gap: 6 (ref 5–15)
BUN: 9 mg/dL (ref 8–23)
CO2: 27 mmol/L (ref 22–32)
Calcium: 9.4 mg/dL (ref 8.9–10.3)
Chloride: 105 mmol/L (ref 98–111)
Creatinine, Ser: 0.85 mg/dL (ref 0.61–1.24)
GFR calc Af Amer: >60 mL/min (ref >60)
GFR calc non Af Amer: >60 mL/min (ref >60)
Glucose, Bld: 127 mg/dL — ABNORMAL HIGH (ref 70–99)
Potassium: 3.6 mmol/L (ref 3.5–5.1)
Sodium: 138 mmol/L (ref 135–145)
Total Bilirubin: 0.1 mg/dL — ABNORMAL LOW (ref 0.3–1.2)
Total Protein: 5.8 g/dL — ABNORMAL LOW (ref 6.5–8.1)

## 2019-06-23 LAB — CBC WITH DIFFERENTIAL/PLATELET
Abs Immature Granulocytes: 0.05 10*3/uL (ref 0.00–0.07)
Basophils Absolute: 0 10*3/uL (ref 0.0–0.1)
Basophils Relative: 0 %
Eosinophils Absolute: 0.1 10*3/uL (ref 0.0–0.5)
Eosinophils Relative: 1 %
HCT: 34.4 % — ABNORMAL LOW (ref 39.0–52.0)
Hemoglobin: 11 g/dL — ABNORMAL LOW (ref 13.0–17.0)
Immature Granulocytes: 1 %
Lymphocytes Relative: 26 %
Lymphs Abs: 1.8 10*3/uL (ref 0.7–4.0)
MCH: 30.4 pg (ref 26.0–34.0)
MCHC: 32 g/dL (ref 30.0–36.0)
MCV: 95 fL (ref 80.0–100.0)
Monocytes Absolute: 0.8 10*3/uL (ref 0.1–1.0)
Monocytes Relative: 11 %
Neutro Abs: 4.3 10*3/uL (ref 1.7–7.7)
Neutrophils Relative %: 61 %
Platelets: 203 10*3/uL (ref 150–400)
RBC: 3.62 MIL/uL — ABNORMAL LOW (ref 4.22–5.81)
RDW: 16.6 % — ABNORMAL HIGH (ref 11.5–15.5)
WBC: 7.1 10*3/uL (ref 4.0–10.5)
nRBC: 0 % (ref 0.0–0.2)

## 2019-06-23 LAB — PSA: Prostatic Specific Antigen: 193 ng/mL — ABNORMAL HIGH (ref 0.00–4.00)

## 2019-06-24 ENCOUNTER — Inpatient Hospital Stay (HOSPITAL_BASED_OUTPATIENT_CLINIC_OR_DEPARTMENT_OTHER): Payer: 59 | Admitting: Internal Medicine

## 2019-06-24 ENCOUNTER — Encounter: Payer: Self-pay | Admitting: Internal Medicine

## 2019-06-24 ENCOUNTER — Inpatient Hospital Stay: Payer: 59

## 2019-06-24 ENCOUNTER — Other Ambulatory Visit: Payer: 59

## 2019-06-24 DIAGNOSIS — C61 Malignant neoplasm of prostate: Secondary | ICD-10-CM

## 2019-06-24 DIAGNOSIS — I25118 Atherosclerotic heart disease of native coronary artery with other forms of angina pectoris: Secondary | ICD-10-CM

## 2019-06-24 DIAGNOSIS — C7951 Secondary malignant neoplasm of bone: Secondary | ICD-10-CM | POA: Diagnosis not present

## 2019-06-24 DIAGNOSIS — L089 Local infection of the skin and subcutaneous tissue, unspecified: Secondary | ICD-10-CM | POA: Diagnosis not present

## 2019-06-24 DIAGNOSIS — Z7189 Other specified counseling: Secondary | ICD-10-CM

## 2019-06-24 DIAGNOSIS — L723 Sebaceous cyst: Secondary | ICD-10-CM | POA: Diagnosis not present

## 2019-06-24 DIAGNOSIS — G893 Neoplasm related pain (acute) (chronic): Secondary | ICD-10-CM | POA: Diagnosis not present

## 2019-06-24 DIAGNOSIS — Z5111 Encounter for antineoplastic chemotherapy: Secondary | ICD-10-CM | POA: Diagnosis not present

## 2019-06-24 DIAGNOSIS — Z79899 Other long term (current) drug therapy: Secondary | ICD-10-CM | POA: Diagnosis not present

## 2019-06-24 MED ORDER — SODIUM CHLORIDE 0.9 % IV SOLN
Freq: Once | INTRAVENOUS | Status: AC
  Filled 2019-06-24: qty 250

## 2019-06-24 MED ORDER — SODIUM CHLORIDE 0.9 % IV SOLN
36.0000 mg/m2 | Freq: Once | INTRAVENOUS | Status: AC
  Administered 2019-06-24: 70 mg via INTRAVENOUS
  Filled 2019-06-24: qty 7

## 2019-06-24 MED ORDER — DEXAMETHASONE SODIUM PHOSPHATE 10 MG/ML IJ SOLN
10.0000 mg | Freq: Once | INTRAMUSCULAR | Status: AC
  Administered 2019-06-24: 10 mg via INTRAVENOUS
  Filled 2019-06-24: qty 1

## 2019-06-24 MED ORDER — OXYCODONE HCL 15 MG PO TABS: 15.0000 mg | ORAL_TABLET | Freq: Four times a day (QID) | ORAL | 0 refills | Status: DC | PRN

## 2019-06-24 NOTE — Assessment & Plan Note (Addendum)
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 ADT; Taxotere q 2 weeks; MAY 2021- PET- Improved; however PSA- rising 70 to 190.    # proceed with Taxotere; Labs today reviewed;  acceptable for treatment today. Discussed re: using cabazitaxel if clinical progression noted.   # "confused"-post? Eligard [on May 28th]; currently resolved.  Would recommend switching over to [place] start in August mid.   # Malignancy related pain- continue fentanyl patch hydrocodone;. Continue fentanyl patch to every 48 hours and continue oxycodone prn; STABLE; new script given.   # infected sebaceous cyst-s/p excision; improved.   # DISPOSITION:  # chemo today # in 2 weeks- MD;  Reva Bores- cbc/cmp/PSA 1 day prior in [place];] Taxoetere;-Dr.B

## 2019-06-24 NOTE — Progress Notes (Signed)
[place] OFFICE PROGRESS NOTE  Patient Care Team: Olin Hauser, [name], MD as PCP - Cardiology (Cardiology) Minna Merritts, [name], [name]  Cancer Staging No matching staging information was found for the patient.   Oncology History Overview Note  # 2011- PROSTATE CANCER [Gleason 3+4]; s/p Prostatectomy [ also involved bladder neck/ECP; Dr.Polaseck]; July 2014- Biochemical recurrence [PSA 14]- started on Zoladex [Dr.Pandit]; lost to follow up.  # JAN 2017- STAGE IV METASTATIC PROSTATE Cancer to Bone- Feb 13th, 2017-  Lupron q 19m[~end of feb]; PSA: 1021; Declined Chemo; April 2017 [xofigo x6; Dr.Crystal]; AUG 2017- Zytiga + Prednisone BID. Bone scan-Jan 2018- improved skeletal metastases.   # MAY 1st 2019- START X-tandi [stopped zytiga/PSA- 8.8/rising]  #August 23, 2018-stop Xtandi [intolerance-fatigue mental fogginess]  #Mid September 2020-apalutamide; stopped mid-October poor tolerance/progression  # 11/01/2018- -Taxotere weekly [consent]  # Mets to bone- start X-geva q 44M [May 30th]; hold dental issues/infections  # Smoker/ chronic pain/pain clinic   #[April 2018; [place]]- s/p stenting; on Plavix; --------------------------------------------------------------  DIAGNOSIS: _0  Castrate resistant prostate cancer  STAGE:   4    ;GOALS: Palliative  CURRENT/MOST RECENT THERAPY-Late Oct 2020- Taxotere weekly [consent]    [place]  12/08/2014 Initial Diagnosis   [place] cancer [place] to bone ([facility]   11/01/2018 -  Chemotherapy   The patient had DOCEtaxel (TAXOTERE) 70 mg in sodium chloride 0.9 % 150 mL chemo infusion, 36 mg/m2 = 70 mg, Intravenous,  Once, 17 of 21 cycles Administration: 70 mg (11/08/2018), 70 mg (11/22/2018), 70 mg (12/13/2018), 70 mg (12/06/2018),  70 mg (12/31/2018), 70 mg (01/07/2019), 70 mg (01/29/2019), 70 mg (12/20/2018), 70 mg (02/06/2019), 70 mg (02/14/2019), 70 mg (02/28/2019), 70 mg (03/14/2019), 70 mg (03/28/2019), 70 mg (04/15/2019), 70 mg (04/29/2019), 70 mg (05/27/2019)  for chemotherapy treatment.     INTERVAL HISTORY:  Robert CARREIRA89y.o.  male pleasant patient above history of metastatic castrate resistant prostate cancer currently -on Taxotere weekly is here for follow-up.  Patient's chemotherapy was held 2 weeks ago because of infected sebaceous cyst.  Patient was evaluated by Dr. BTollie Pizzasurgery had this is a patient.  He has 1 more day of antibiotics.  Well-healing.  Chronic fatigue.  Chronic back pain.  Chronic joint pains but not any worse.  Chronic shortness of breath.  No fevers or chills.  Review of Systems  Constitutional: Positive for malaise/fatigue and weight loss. Negative for chills, diaphoresis and fever.  HENT: Negative for nosebleeds and sore throat.   Eyes: Negative for double vision.  Respiratory: Positive for shortness of breath. Negative for cough, hemoptysis, sputum production and wheezing.   Cardiovascular: Negative for chest pain, palpitations, orthopnea and leg swelling.  Gastrointestinal: Positive for nausea. Negative for abdominal pain, blood in stool, diarrhea, heartburn, melena and vomiting.  Genitourinary: Negative for dysuria, frequency and urgency.  Musculoskeletal: Positive for back pain and joint pain.  Skin: Negative for itching.  Neurological: Negative for dizziness, tingling, focal weakness, weakness and headaches.  Endo/Heme/Allergies: Does not bruise/bleed easily.  Psychiatric/Behavioral: Negative for depression. The patient is not nervous/anxious and does not have insomnia.       PAST MEDICAL HISTORY :  Past Medical History:  Diagnosis Date  . Back pain 10/09/2012  . Bone cancer ([facility]   . CAD (coronary artery disease)    a.  08/2015 PCI: LM nl, LAD 20d, LCX nl, RCA 60m RPAV 95  (3.0x18 Xience Alpine DES);  b. 04/2016 PCI ([facility]: RPL 95 (2.75x8 Promus Premier DES).  . Cancer associated pain   . Depression   . History of echocardiogram    a. 08/2016 Echo: EF 50-55%.  .Marland KitchenHistory of kidney stones   . Hyperlipidemia   . Hypertension   . Joint pain   . Prostate cancer ([facility]    a.  s/p prostatectomy ([facility]);  b. bone mets noted 04/2016.  . Right arm pain 01/10/2016  . Right foot pain 01/10/2016  . Right leg pain 01/10/2016  . Tobacco abuse     PAST SURGICAL HISTORY :   Past Surgical History:  Procedure Laterality Date  . CARDIAC CATHETERIZATION     [facility]  . CARDIAC CATHETERIZATION N/A 08/16/2015   Procedure: Left Heart Cath and Coronary Angiography;  Surgeon: DYolonda Kida MD;  [facility];  Service: Cardiovascular;  Laterality: N/A;  . CARDIAC CATHETERIZATION N/A 08/16/2015   Procedure: Coronary Stent Intervention;  Surgeon: DYolonda Kida MD;  [facility];  Service: Cardiovascular;  Laterality: N/A;  . CERVIX SURGERY    . PROSTATECTOMY    . SPINE SURGERY    . TONSILLECTOMY    . WRIST SURGERY      FAMILY HISTORY :   Family History  Problem Relation Age of Onset  . Heart attack Mother   . Hypertension Mother   . Heart attack Father     SOCIAL HISTORY:   Social History   Tobacco Use  . Smoking status: Current Every Day Smoker    Packs/day: 0.50    Years: 40.00    Pack years: 20.00    Types: Cigarettes  . Smokeless tobacco: Current User    Types: Chew  . Tobacco comment: 1 pack every couple days   Vaping Use  . Vaping Use: Never used  Substance Use Topics  . Alcohol use: Yes    Comment: occasionally  . Drug use: No    ALLERGIES:  is allergic to ditropan [oxybutynin].  MEDICATIONS:  Current Outpatient Medications  Medication Sig Dispense Refill  . acetaminophen (TYLENOL) 500 MG tablet Take 1,000 mg by mouth every 8 (eight) hours as needed.     .Marland Kitchenalbuterol (PROVENTIL HFA;VENTOLIN HFA) 108 (90  Base) MCG/ACT inhaler Inhale 1-2 puffs into the lungs every 6 (six) hours as needed for wheezing or shortness of breath. 1 Inhaler 0  . amoxicillin (AMOXIL) 500 MG capsule Take 1 capsule (500 mg total) by mouth 3 (three) times daily. 30 capsule 0  . aspirin 81 MG tablet Take 81 mg by mouth daily.    .Marland Kitchenatorvastatin (LIPITOR) 80 MG tablet Take 1 tablet (80 mg total) by mouth daily at 6 PM. 90 tablet 3  . chlorhexidine (PERIDEX) 0.12 % solution     . clopidogrel (PLAVIX) 75 MG tablet Take 1 tablet (75 mg total) by mouth daily. 90 tablet 3  . dexamethasone (DECADRON) 4 MG tablet Take 4 mg by mouth daily. Take for 3 days with chemotherapy    . fentaNYL (DURAGESIC) 100 MCG/HR Place 1 patch onto the skin every other day. Along with 254m patch for total of 125 mcg every 2 days. 15 patch 0  . fentaNYL (DURAGESIC) 25 MCG/HR Place 1 patch onto the skin every other day. Use this along with fentanyl patch 100 mcg [total 125 mcg] every 2 days 15 patch 0  .  gabapentin (NEURONTIN) 100 MG capsule Take 1 capsule (100 mg total) by mouth 3 (three) times daily. 90 capsule 5  . magnesium hydroxide (MILK OF MAGNESIA) 400 MG/5ML suspension Take 5 mLs by mouth daily as needed for constipation.    . metoprolol tartrate (LOPRESSOR) 25 MG tablet TAKE 1/2 TABLET BY MOUTH 2 TIMES DAILY. 90 tablet 1  . naloxone (NARCAN) nasal spray 4 mg/0.1 mL 1 spray into nostril upon signs of opioid overdose. Call 911. May repeat once if no response within 2-3 minutes. (Patient not taking: Reported on 05/13/2019) 1 kit 0  . nitroGLYCERIN (NITROSTAT) 0.4 MG SL tablet Place 1 tablet (0.4 mg total) under the tongue every 5 (five) minutes as needed. (Patient not taking: Reported on 05/13/2019) 25 tablet 3  . ondansetron (ZOFRAN) 8 MG tablet One pill every 8 hours as needed for nausea/vomitting. 40 tablet 1  . [START ON 06/27/2019] oxyCODONE (ROXICODONE) 15 MG immediate release tablet Take 1 tablet (15 mg total) by mouth every 6 (six) hours as needed.  for pain 90 tablet 0  . polyethylene glycol (MIRALAX / GLYCOLAX) packet Take 17 g by mouth daily as needed for moderate constipation.    . predniSONE (DELTASONE) 5 MG tablet Take 1 tablet (5 mg total) by mouth daily with breakfast. 30 tablet 3  . promethazine (PHENERGAN) 25 MG tablet TAKE 1/2 TO 1 TABLET (12.5-25 MG TOTAL) BY MOUTH EVERY 8 (EIGHT) HOURS AS NEEDED FOR NAUSEA OR VOMITING. 60 tablet 3  . traZODone (DESYREL) 50 MG tablet Take 1-2 tablets (50-100 mg total) by mouth at bedtime as needed for sleep. 60 tablet 2  . triamcinolone ointment (KENALOG) 0.5 % Apply 1 application topically 2 (two) times daily. 30 g 0  . Wheat Dextrin (BENEFIBER) POWD Stir 2 tsp. TID into 4-8 oz of any non-carbonated beverage or soft food (hot or cold) 500 g PRN   No current facility-administered medications for this visit.    PHYSICAL EXAMINATION: ECOG PERFORMANCE STATUS: 1 - Symptomatic but completely ambulatory  BP 124/76   Pulse 91   Temp 99 F (37.2 C) (Tympanic)   Resp 16   Ht _0  (1.803 m)   Wt 162 lb (73.5 kg)   SpO2 97%   BMI 22.59 kg/m   Filed Weights   06/24/19 1302  Weight: 162 lb (73.5 kg)    Physical Exam Constitutional:      Comments:  Frail-appearing Caucasian male patient.  He is in a walking with a cane.  Alone.  HENT:     Head: Normocephalic and atraumatic.     Mouth/Throat:     Pharynx: No oropharyngeal exudate.  Eyes:     Pupils: Pupils are equal, round, and reactive to light.  Cardiovascular:     Rate and Rhythm: Normal rate and regular rhythm.  Pulmonary:     Effort: No respiratory distress.     Breath sounds: No wheezing.  Abdominal:     General: Bowel sounds are normal. There is no distension.     Palpations: Abdomen is soft. There is no mass.     Tenderness: There is no abdominal tenderness. There is no guarding or rebound.  Musculoskeletal:        General: No tenderness. Normal range of motion.     Cervical back: Normal range of motion and neck supple.   Skin:    General: Skin is warm.     Comments: Macular rash on hands  Neurological:     Mental Status: He is  alert and oriented to person, place, and time.  Psychiatric:        Mood and Affect: Affect normal.     LABORATORY DATA:  I have reviewed the data as listed    Component Value Date/Time   NA 138 06/23/2019 1106   NA 140 07/13/2013 1542   K 3.6 06/23/2019 1106   K 4.3 07/13/2013 1542   CL 105 06/23/2019 1106   CL 107 07/13/2013 1542   CO2 27 06/23/2019 1106   CO2 26 07/13/2013 1542   GLUCOSE 127 (H) 06/23/2019 1106   GLUCOSE 106 (H) 07/13/2013 1542   BUN 9 06/23/2019 1106   BUN 11 07/13/2013 1542   CREATININE 0.85 06/23/2019 1106   CREATININE 0.76 10/15/2017 1001   CALCIUM 9.4 06/23/2019 1106   CALCIUM 9.9 07/13/2013 1542   PROT 5.8 (L) 06/23/2019 1106   PROT 8.1 07/13/2013 1542   ALBUMIN 3.3 (L) 06/23/2019 1106   ALBUMIN 4.3 07/13/2013 1542   AST 14 (L) 06/23/2019 1106   AST 26 07/13/2013 1542   ALT 9 06/23/2019 1106   ALT 32 07/13/2013 1542   ALKPHOS 35 (L) 06/23/2019 1106   ALKPHOS 74 07/13/2013 1542   BILITOT 0.1 (L) 06/23/2019 1106   BILITOT 0.4 07/13/2013 1542   GFRNONAA >60 06/23/2019 1106   GFRNONAA 95 10/15/2017 1001   GFRAA >60 06/23/2019 1106   GFRAA 110 10/15/2017 1001    No results found for: SPEP, UPEP  Lab Results  Component Value Date   WBC 7.1 06/23/2019   NEUTROABS 4.3 06/23/2019   HGB 11.0 (L) 06/23/2019   HCT 34.4 (L) 06/23/2019   MCV 95.0 06/23/2019   PLT 203 06/23/2019      Chemistry      Component Value Date/Time   NA 138 06/23/2019 1106   NA 140 07/13/2013 1542   K 3.6 06/23/2019 1106   K 4.3 07/13/2013 1542   CL 105 06/23/2019 1106   CL 107 07/13/2013 1542   CO2 27 06/23/2019 1106   CO2 26 07/13/2013 1542   BUN 9 06/23/2019 1106   BUN 11 07/13/2013 1542   CREATININE 0.85 06/23/2019 1106   CREATININE 0.76 10/15/2017 1001      Component Value Date/Time   CALCIUM 9.4 06/23/2019 1106   CALCIUM 9.9 07/13/2013  1542   ALKPHOS 35 (L) 06/23/2019 1106   ALKPHOS 74 07/13/2013 1542   AST 14 (L) 06/23/2019 1106   AST 26 07/13/2013 1542   ALT 9 06/23/2019 1106   ALT 32 07/13/2013 1542   BILITOT 0.1 (L) 06/23/2019 1106   BILITOT 0.4 07/13/2013 1542     RADIOGRAPHIC STUDIES: I have personally reviewed the radiological images as listed and agreed with the findings in the report. No results found.   ASSESSMENT & PLAN:  Prostate cancer metastatic to bone ([place]) # Castrate resistant prostate cancer metastases to bone. ADT; Taxotere q 2 weeks; MAY 2021- PET- Improved; however PSA- rising 70 to 190.    # proceed with Taxotere; Labs today reviewed;  acceptable for treatment today. Discussed re: using cabazitaxel if clinical progression noted.   # "confused"-post? Eligard [on May 28th]; currently resolved.  Would recommend switching over to [place] start in August mid.   # Malignancy related pain- continue fentanyl patch hydrocodone;. Continue fentanyl patch to every 48 hours and continue oxycodone prn; STABLE; new script given.   # infected sebaceous cyst-s/p excision; improved.   # DISPOSITION:  # chemo today # in 2 weeks- MD;  Reva Bores-  cbc/cmp/PSA 1 day prior in [place];] Taxoetere;-Dr.B    No orders of the defined types were placed in this encounter.  All questions were answered. The patient knows to call the clinic with any problems, questions or concerns.      [name], MD 06/24/2019 1:39 PM

## 2019-07-02 ENCOUNTER — Encounter: Payer: Self-pay | Admitting: Internal Medicine

## 2019-07-02 ENCOUNTER — Other Ambulatory Visit: Payer: Self-pay | Admitting: *Deleted

## 2019-07-02 DIAGNOSIS — C61 Malignant neoplasm of prostate: Secondary | ICD-10-CM

## 2019-07-02 DIAGNOSIS — G893 Neoplasm related pain (acute) (chronic): Secondary | ICD-10-CM

## 2019-07-02 MED ORDER — FENTANYL 100 MCG/HR TD PT72: 1.0000 | MEDICATED_PATCH | TRANSDERMAL | 0 refills | Status: DC

## 2019-07-02 MED ORDER — FENTANYL 25 MCG/HR TD PT72: 1.0000 | MEDICATED_PATCH | TRANSDERMAL | 0 refills | Status: DC

## 2019-07-02 NOTE — Telephone Encounter (Signed)
Patient needs RF on Fentayl patches. Would like to get the RF this week before July 4'th. Script may need a PA. Sonia Baller- Would you approve script on behalf of Dr. B?

## 2019-07-08 ENCOUNTER — Inpatient Hospital Stay: Payer: 59 | Attending: Hematology and Oncology

## 2019-07-08 ENCOUNTER — Other Ambulatory Visit: Payer: Self-pay

## 2019-07-08 DIAGNOSIS — C7951 Secondary malignant neoplasm of bone: Secondary | ICD-10-CM | POA: Insufficient documentation

## 2019-07-08 DIAGNOSIS — Z5112 Encounter for antineoplastic immunotherapy: Secondary | ICD-10-CM | POA: Diagnosis not present

## 2019-07-08 DIAGNOSIS — Z79899 Other long term (current) drug therapy: Secondary | ICD-10-CM | POA: Insufficient documentation

## 2019-07-08 DIAGNOSIS — C61 Malignant neoplasm of prostate: Secondary | ICD-10-CM | POA: Diagnosis not present

## 2019-07-08 DIAGNOSIS — G893 Neoplasm related pain (acute) (chronic): Secondary | ICD-10-CM | POA: Insufficient documentation

## 2019-07-08 LAB — CBC WITH DIFFERENTIAL/PLATELET
Abs Immature Granulocytes: 0.02 10*3/uL (ref 0.00–0.07)
Basophils Absolute: 0 10*3/uL (ref 0.0–0.1)
Basophils Relative: 1 %
Eosinophils Absolute: 0 10*3/uL (ref 0.0–0.5)
Eosinophils Relative: 1 %
HCT: 31.6 % — ABNORMAL LOW (ref 39.0–52.0)
Hemoglobin: 10.3 g/dL — ABNORMAL LOW (ref 13.0–17.0)
Immature Granulocytes: 1 %
Lymphocytes Relative: 38 %
Lymphs Abs: 1.5 10*3/uL (ref 0.7–4.0)
MCH: 30.7 pg (ref 26.0–34.0)
MCHC: 32.6 g/dL (ref 30.0–36.0)
MCV: 94.3 fL (ref 80.0–100.0)
Monocytes Absolute: 0.6 10*3/uL (ref 0.1–1.0)
Monocytes Relative: 14 %
Neutro Abs: 1.8 10*3/uL (ref 1.7–7.7)
Neutrophils Relative %: 45 %
Platelets: 212 10*3/uL (ref 150–400)
RBC: 3.35 MIL/uL — ABNORMAL LOW (ref 4.22–5.81)
RDW: 15.8 % — ABNORMAL HIGH (ref 11.5–15.5)
WBC: 3.9 10*3/uL — ABNORMAL LOW (ref 4.0–10.5)
nRBC: 0 % (ref 0.0–0.2)

## 2019-07-08 LAB — COMPREHENSIVE METABOLIC PANEL
ALT: 9 U/L (ref 0–44)
AST: 14 U/L — ABNORMAL LOW (ref 15–41)
Albumin: 3.2 g/dL — ABNORMAL LOW (ref 3.5–5.0)
Alkaline Phosphatase: 34 U/L — ABNORMAL LOW (ref 38–126)
Anion gap: 7 (ref 5–15)
BUN: 10 mg/dL (ref 8–23)
CO2: 25 mmol/L (ref 22–32)
Calcium: 8.8 mg/dL — ABNORMAL LOW (ref 8.9–10.3)
Chloride: 104 mmol/L (ref 98–111)
Creatinine, Ser: 0.83 mg/dL (ref 0.61–1.24)
GFR calc Af Amer: >60 mL/min (ref >60)
GFR calc non Af Amer: >60 mL/min (ref >60)
Glucose, Bld: 114 mg/dL — ABNORMAL HIGH (ref 70–99)
Potassium: 3.6 mmol/L (ref 3.5–5.1)
Sodium: 136 mmol/L (ref 135–145)
Total Bilirubin: 0.5 mg/dL (ref 0.3–1.2)
Total Protein: 5.4 g/dL — ABNORMAL LOW (ref 6.5–8.1)

## 2019-07-08 LAB — PSA: Prostatic Specific Antigen: 240 ng/mL — ABNORMAL HIGH (ref 0.00–4.00)

## 2019-07-09 ENCOUNTER — Inpatient Hospital Stay (HOSPITAL_BASED_OUTPATIENT_CLINIC_OR_DEPARTMENT_OTHER): Payer: 59 | Admitting: Internal Medicine

## 2019-07-09 ENCOUNTER — Inpatient Hospital Stay: Payer: 59

## 2019-07-09 ENCOUNTER — Encounter: Payer: Self-pay | Admitting: Internal Medicine

## 2019-07-09 DIAGNOSIS — Z79899 Other long term (current) drug therapy: Secondary | ICD-10-CM | POA: Diagnosis not present

## 2019-07-09 DIAGNOSIS — C61 Malignant neoplasm of prostate: Secondary | ICD-10-CM

## 2019-07-09 DIAGNOSIS — C7951 Secondary malignant neoplasm of bone: Secondary | ICD-10-CM

## 2019-07-09 DIAGNOSIS — G893 Neoplasm related pain (acute) (chronic): Secondary | ICD-10-CM | POA: Diagnosis not present

## 2019-07-09 DIAGNOSIS — Z5112 Encounter for antineoplastic immunotherapy: Secondary | ICD-10-CM | POA: Diagnosis not present

## 2019-07-09 DIAGNOSIS — Z7189 Other specified counseling: Secondary | ICD-10-CM

## 2019-07-09 DIAGNOSIS — I25118 Atherosclerotic heart disease of native coronary artery with other forms of angina pectoris: Secondary | ICD-10-CM

## 2019-07-09 MED ORDER — SODIUM CHLORIDE 0.9 % IV SOLN
Freq: Once | INTRAVENOUS | Status: AC
  Filled 2019-07-09: qty 250

## 2019-07-09 MED ORDER — SODIUM CHLORIDE 0.9 % IV SOLN
36.0000 mg/m2 | Freq: Once | INTRAVENOUS | Status: AC
  Administered 2019-07-09: 70 mg via INTRAVENOUS
  Filled 2019-07-09: qty 7

## 2019-07-09 MED ORDER — DEXAMETHASONE SODIUM PHOSPHATE 10 MG/ML IJ SOLN
10.0000 mg | Freq: Once | INTRAMUSCULAR | Status: AC
  Administered 2019-07-09: 10 mg via INTRAVENOUS
  Filled 2019-07-09: qty 1

## 2019-07-09 NOTE — Assessment & Plan Note (Addendum)
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 ADT; Taxotere q 2 weeks; MAY 2021- PET- Improved; however PSA- rising 70 to 240.    # proceed with Taxotere; Labs today reviewed;  acceptable for treatment today   #Given the significant jump in PSA; progression of disease would recommend discontinuation of Taxotere.  Recommend cabazitaxel.  Discussed the mechanism of cabazitaxel; Discussed the potential side effects including but not limited to-increasing fatigue, nausea vomiting, diarrhea, hair loss, sores in the mouth, increase risk of infection and also neuropathy.  Also the need with factor support.  # "confused"-post? Eligard [on May 28th]; currently resolved.  Plan to start [place] start in August mid.   # Malignancy related pain- continue fentanyl patch hydrocodone;. Continue fentanyl patch to every 48 hours and continue oxycodone prn; STABLE.   #Prognosis: Unfortunately continues to be poor given the progression on Taxotere.  Understands treatments are palliative not curative.  # DISPOSITION:  # chemo today # in 3 weeks- MD;  Reva Bores- cbc/cmp/PSA 1 day prior in [place];] Cabazitaxel [NEW]Dr.B

## 2019-07-09 NOTE — Progress Notes (Signed)
[facility] OFFICE PROGRESS NOTE  Patient Care Team: Olin Hauser, [name], MD as PCP - Cardiology (Cardiology) Minna Merritts, [name], [name]  Cancer Staging No matching staging information was found for the patient.   Oncology History Overview Note  # 2011- PROSTATE CANCER [Gleason 3+4]; s/p Prostatectomy [ also involved bladder neck/ECP; Dr.Polaseck]; July 2014- Biochemical recurrence [PSA 14]- started on Zoladex [Dr.Pandit]; lost to follow up.  # JAN 2017- STAGE IV METASTATIC PROSTATE Cancer to Bone- Feb 13th, 2017-  Lupron q 52m[~end of feb]; PSA: 1021; Declined Chemo; April 2017 [xofigo x6; Dr.Crystal]; AUG 2017- Zytiga + Prednisone BID. Bone scan-Jan 2018- improved skeletal metastases.   # MAY 1st 2019- START X-tandi [stopped zytiga/PSA- 8.8/rising]  #August 23, 2018-stop Xtandi [intolerance-fatigue mental fogginess]  #Mid September 2020-apalutamide; stopped mid-October poor tolerance/progression  # 11/01/2018- -Taxotere weekly [consent]  # Mets to bone- start X-geva q 67M [May 30th]; hold dental issues/infections  # Smoker/ chronic pain/pain clinic   #[April 2018; [place]]- s/p stenting; on Plavix; --------------------------------------------------------------  DIAGNOSIS: [ ] Castrate resistant prostate cancer  STAGE:   4    ;GOALS: Palliative  CURRENT/MOST RECENT THERAPY-Late Oct 2020- Taxotere weekly [consent]    [place]  12/08/2014 Initial Diagnosis   [place]   11/01/2018 - 07/09/2019 Chemotherapy   The patient had DOCEtaxel (TAXOTERE) 70 mg in sodium chloride 0.9 % 150 mL chemo infusion, 36 mg/m2 = 70 mg, Intravenous,  Once, 19 of 21 cycles Administration: 70 mg (11/08/2018), 70 mg (11/22/2018), 70 mg (12/13/2018), 70 mg  (12/06/2018), 70 mg (12/31/2018), 70 mg (01/07/2019), 70 mg (01/29/2019), 70 mg (12/20/2018), 70 mg (02/06/2019), 70 mg (02/14/2019), 70 mg (02/28/2019), 70 mg (03/14/2019), 70 mg (03/28/2019), 70 mg (04/15/2019), 70 mg (04/29/2019), 70 mg (05/27/2019), 70 mg (06/24/2019), 70 mg (07/09/2019)  for chemotherapy treatment.    07/24/2019 -  Chemotherapy   The patient had pegfilgrastim-jmdb (FULPHILA) injection 6 mg, 6 mg, Subcutaneous,  Once, 0 of 4 cycles cabazitaxel (JEVTANA) 38 mg in sodium chloride 0.9 % 250 mL chemo infusion, 20 mg/m2, Intravenous,  Once, 0 of 4 cycles  for chemotherapy treatment.     INTERVAL HISTORY:  Robert GARRINGER62y.o.  male pleasant patient above history of metastatic castrate resistant prostate cancer currently -on Taxotere weekly is here for follow-up.  Patient continues to complain of chronic mild to moderate fatigue.  Complains of chronic but worsening back pain joint pains.  Appetite is fair.  No falls.  No headaches.   Review of Systems  Constitutional: Positive for malaise/fatigue and weight loss. Negative for chills, diaphoresis and fever.  HENT: Negative for nosebleeds and sore throat.   Eyes: Negative for double vision.  Respiratory: Positive for shortness of breath. Negative for cough, hemoptysis, sputum production and wheezing.   Cardiovascular: Negative for chest pain, palpitations, orthopnea and leg swelling.  Gastrointestinal: Positive for nausea. Negative for abdominal pain, blood in stool, diarrhea, heartburn, melena and vomiting.  Genitourinary: Negative for dysuria, frequency and urgency.  Musculoskeletal: Positive for back pain and joint pain.  Skin: Negative for itching.  Neurological: Negative for dizziness, tingling, focal weakness, weakness and headaches.  Endo/Heme/Allergies: Does not bruise/bleed easily.  Psychiatric/Behavioral: Negative for depression. The patient is not nervous/anxious and does not have insomnia.       PAST MEDICAL HISTORY :  Past  Medical History:  Diagnosis Date  . Back pain 10/09/2012  . Bone cancer ([place])   . CAD (coronary artery disease)    a. 08/2015 PCI: LM nl, LAD 20d, LCX nl, RCA 61m RPAV 95 (3.0x18 Xience Alpine DES);  b. 04/2016 PCI ([facility]: RPL 95 (2.75x8 Promus Premier DES).  . Cancer associated pain   . Depression   . History of echocardiogram    a. 08/2016 Echo: EF 50-55%.  .Marland KitchenHistory of kidney stones   . Hyperlipidemia   . Hypertension   . Joint pain   . Prostate cancer ([facility]    a.  s/p prostatectomy ([facility]);  b. bone mets noted 04/2016.  . Right arm pain 01/10/2016  . Right foot pain 01/10/2016  . Right leg pain 01/10/2016  . Tobacco abuse     PAST SURGICAL HISTORY :   Past Surgical History:  Procedure Laterality Date  . CARDIAC CATHETERIZATION     [facility]  . CARDIAC CATHETERIZATION N/A 08/16/2015   Procedure: Left Heart Cath and Coronary Angiography;  Surgeon: DYolonda Kida MD;  [facility];  Service: Cardiovascular;  Laterality: N/A;  . CARDIAC CATHETERIZATION N/A 08/16/2015   Procedure: Coronary Stent Intervention;  Surgeon: DYolonda Kida MD;  [facility];  Service: Cardiovascular;  Laterality: N/A;  . CERVIX SURGERY    . PROSTATECTOMY    . SPINE SURGERY    . TONSILLECTOMY    . WRIST SURGERY      FAMILY HISTORY :   Family History  Problem Relation Age of Onset  . Heart attack Mother   . Hypertension Mother   . Heart attack Father     SOCIAL HISTORY:   Social History   Tobacco Use  . Smoking status: Current Every Day Smoker    Packs/day: 0.50    Years: 40.00    Pack years: 20.00    Types: Cigarettes  . Smokeless tobacco: Current User    Types: Chew  . Tobacco comment: 1 pack every couple days   Vaping Use  . Vaping Use: Never used  Substance Use Topics  . Alcohol use: Yes    Comment: occasionally  . Drug use: No    ALLERGIES:  is allergic to ditropan [oxybutynin].  MEDICATIONS:  Current Outpatient Medications   Medication Sig Dispense Refill  . acetaminophen (TYLENOL) 500 MG tablet Take 1,000 mg by mouth every 8 (eight) hours as needed.     .Marland Kitchenalbuterol (PROVENTIL HFA;VENTOLIN HFA) 108 (90 Base) MCG/ACT inhaler Inhale 1-2 puffs into the lungs every 6 (six) hours as needed for wheezing or shortness of breath. 1 Inhaler 0  . aspirin 81 MG tablet Take 81 mg by mouth daily.    .Marland Kitchenatorvastatin (LIPITOR) 80 MG tablet Take 1 tablet (80 mg total) by mouth daily at 6 PM. 90 tablet 3  . clopidogrel (PLAVIX) 75 MG tablet Take 1 tablet (75 mg total) by mouth daily. 90 tablet 3  . fentaNYL (DURAGESIC) 100 MCG/HR Place 1 patch onto the skin every other day. Along with 237m patch for total of 125 mcg every 2 days. 15 patch 0  . fentaNYL (DURAGESIC) 25 MCG/HR Place 1 patch onto the skin every other day. Use this along with fentanyl patch 100 mcg [total 125 mcg] every 2 days 15 patch 0  . gabapentin (NEURONTIN) 100 MG capsule Take 1 capsule (100 mg total) by mouth 3 (three) times daily. 90 capsule 5  . metoprolol tartrate (LOPRESSOR) 25  MG tablet TAKE 1/2 TABLET BY MOUTH 2 TIMES DAILY. 90 tablet 1  . polyethylene glycol (MIRALAX / GLYCOLAX) packet Take 17 g by mouth daily as needed for moderate constipation.    . predniSONE (DELTASONE) 5 MG tablet Take 1 tablet (5 mg total) by mouth daily with breakfast. 30 tablet 3  . promethazine (PHENERGAN) 25 MG tablet TAKE 1/2 TO 1 TABLET (12.5-25 MG TOTAL) BY MOUTH EVERY 8 (EIGHT) HOURS AS NEEDED FOR NAUSEA OR VOMITING. 60 tablet 3  . traZODone (DESYREL) 50 MG tablet Take 1-2 tablets (50-100 mg total) by mouth at bedtime as needed for sleep. 60 tablet 2  . naloxone (NARCAN) nasal spray 4 mg/0.1 mL 1 spray into nostril upon signs of opioid overdose. Call 911. May repeat once if no response within 2-3 minutes. 1 kit 0  . nitroGLYCERIN (NITROSTAT) 0.4 MG SL tablet Place 1 tablet (0.4 mg total) under the tongue every 5 (five) minutes as needed. 25 tablet 3  . oxyCODONE (ROXICODONE) 15 MG  immediate release tablet Take 1 tablet (15 mg total) by mouth every 6 (six) hours as needed. for pain 90 tablet 0   No current facility-administered medications for this visit.    PHYSICAL EXAMINATION: ECOG PERFORMANCE STATUS: 1 - Symptomatic but completely ambulatory  BP 104/73 (BP Location: Left Arm, Patient Position: Sitting, Cuff Size: Normal)   Pulse 81   Temp (!) 97.3 F (36.3 C) (Tympanic)   Resp 16   Ht 5' 11" (1.803 m)   Wt 160 lb 12.8 oz (72.9 kg)   SpO2 99%   BMI 22.43 kg/m   Filed Weights   07/09/19 1115  Weight: 160 lb 12.8 oz (72.9 kg)    Physical Exam Constitutional:      Comments:  Frail-appearing Caucasian male patient.  He is in a walking with a cane.  Alone.  HENT:     Head: Normocephalic and atraumatic.     Mouth/Throat:     Pharynx: No oropharyngeal exudate.  Eyes:     Pupils: Pupils are equal, round, and reactive to light.  Cardiovascular:     Rate and Rhythm: Normal rate and regular rhythm.  Pulmonary:     Effort: No respiratory distress.     Breath sounds: No wheezing.  Abdominal:     General: Bowel sounds are normal. There is no distension.     Palpations: Abdomen is soft. There is no mass.     Tenderness: There is no abdominal tenderness. There is no guarding or rebound.  Musculoskeletal:        General: No tenderness. Normal range of motion.     Cervical back: Normal range of motion and neck supple.  Skin:    General: Skin is warm.     Comments: Macular rash on hands  Neurological:     Mental Status: He is alert and oriented to person, place, and time.  Psychiatric:        Mood and Affect: Affect normal.     LABORATORY DATA:  I have reviewed the data as listed    Component Value Date/Time   NA 136 07/08/2019 1134   NA 140 07/13/2013 1542   K 3.6 07/08/2019 1134   K 4.3 07/13/2013 1542   CL 104 07/08/2019 1134   CL 107 07/13/2013 1542   CO2 25 07/08/2019 1134   CO2 26 07/13/2013 1542   GLUCOSE 114 (H) 07/08/2019 1134    GLUCOSE 106 (H) 07/13/2013 1542   BUN 10 07/08/2019 1134  BUN 11 07/13/2013 1542   CREATININE 0.83 07/08/2019 1134   CREATININE 0.76 10/15/2017 1001   CALCIUM 8.8 (L) 07/08/2019 1134   CALCIUM 9.9 07/13/2013 1542   PROT 5.4 (L) 07/08/2019 1134   PROT 8.1 07/13/2013 1542   ALBUMIN 3.2 (L) 07/08/2019 1134   ALBUMIN 4.3 07/13/2013 1542   AST 14 (L) 07/08/2019 1134   AST 26 07/13/2013 1542   ALT 9 07/08/2019 1134   ALT 32 07/13/2013 1542   ALKPHOS 34 (L) 07/08/2019 1134   ALKPHOS 74 07/13/2013 1542   BILITOT 0.5 07/08/2019 1134   BILITOT 0.4 07/13/2013 1542   GFRNONAA >60 07/08/2019 1134   GFRNONAA 95 10/15/2017 1001   GFRAA >60 07/08/2019 1134   GFRAA 110 10/15/2017 1001    No results found for: SPEP, UPEP  Lab Results  Component Value Date   WBC 3.9 (L) 07/08/2019   NEUTROABS 1.8 07/08/2019   HGB 10.3 (L) 07/08/2019   HCT 31.6 (L) 07/08/2019   MCV 94.3 07/08/2019   PLT 212 07/08/2019      Chemistry      Component Value Date/Time   NA 136 07/08/2019 1134   NA 140 07/13/2013 1542   K 3.6 07/08/2019 1134   K 4.3 07/13/2013 1542   CL 104 07/08/2019 1134   CL 107 07/13/2013 1542   CO2 25 07/08/2019 1134   CO2 26 07/13/2013 1542   BUN 10 07/08/2019 1134   BUN 11 07/13/2013 1542   CREATININE 0.83 07/08/2019 1134   CREATININE 0.76 10/15/2017 1001      Component Value Date/Time   CALCIUM 8.8 (L) 07/08/2019 1134   CALCIUM 9.9 07/13/2013 1542   ALKPHOS 34 (L) 07/08/2019 1134   ALKPHOS 74 07/13/2013 1542   AST 14 (L) 07/08/2019 1134   AST 26 07/13/2013 1542   ALT 9 07/08/2019 1134   ALT 32 07/13/2013 1542   BILITOT 0.5 07/08/2019 1134   BILITOT 0.4 07/13/2013 1542     RADIOGRAPHIC STUDIES: I have personally reviewed the radiological images as listed and agreed with the findings in the report. No results found.   ASSESSMENT & PLAN:  Prostate cancer metastatic to bone ([place]) # Castrate resistant prostate cancer metastases to bone. ADT; Taxotere q 2 weeks; MAY  2021- PET- Improved; however PSA- rising 70 to 240.    # proceed with Taxotere; Labs today reviewed;  acceptable for treatment today   #Given the significant jump in PSA; progression of disease would recommend discontinuation of Taxotere.  Recommend cabazitaxel.  Discussed the mechanism of cabazitaxel; Discussed the potential side effects including but not limited to-increasing fatigue, nausea vomiting, diarrhea, hair loss, sores in the mouth, increase risk of infection and also neuropathy.  Also the need with factor support.  # "confused"-post? Eligard [on May 28th]; currently resolved.  Plan to start [place] start in August mid.   # Malignancy related pain- continue fentanyl patch hydrocodone;. Continue fentanyl patch to every 48 hours and continue oxycodone prn; STABLE.   #Prognosis: Unfortunately continues to be poor given the progression on Taxotere.  Understands treatments are palliative not curative.  # DISPOSITION:  # chemo today # in 3 weeks- MD;  Reva Bores- cbc/cmp/PSA 1 day prior in [place];] Cabazitaxel [NEW]Dr.B    Orders Placed This Encounter  Procedures  . Comprehensive metabolic panel    Standing Status:   Future    Standing Expiration Date:   07/08/2020  . CBC with Differential/Platelet    Standing Status:   Future  Standing Expiration Date:   07/08/2020  . PSA    Standing Status:   Future    Standing Expiration Date:   07/08/2020   All questions were answered. The patient knows to call the clinic with any problems, questions or concerns.      [name], MD 07/24/2019 9:30 AM

## 2019-07-14 NOTE — Progress Notes (Signed)
Cardiology Office Note  Date:  07/15/2019   ID:  ILIA ENGELBERT, DOB 10-Feb-1950, MRN [id number]  PCP:  Olin Hauser, DO   Chief Complaint  Patient presents with  . office visit    Pt BP going up and down. Meds verbally reviewed w/ pt.    HPI:  Mr. Champoux is a very pleasant 69 year old gentleman with a  long smoking history for the past 40 years who continues to smoke,  CAD stent to the RCA in August 2017  stent placed to the RCA early April 2018 chronic cervical and lower back pain, spinal stenosis,  prostate cancer with prostatectomy  at [facility], metastases to the bone, chemotherapy,  who presents for follow-up of his coronary artery disease, DES placed distal RCA  In follow-up today reports that he has been losing weight, on chemo for his prostate cancer Having nausea with the medications  Weight down 17 pounds, since 12/2017 Missing meals, low appetite Tries to "graze" during the daytime  Sometime low pressures Chronic dizziness, with standing On metoprolol 12.5 BID  Rare high pressure: 937 systolic at night Often in the setting of pain  Recent CT scan reviewed on today's visit  showing positive response to therapy in size and radiotracer activity of mediastinal lymph nodes  EKG personally reviewed by myself on todays visit Shows normal sinus rhythm rate 83 bpm no significant ST-T wave changes No change from prior EKG  Other past medical history reviewed  Presented to the hospital with chest pain, diaphoresis, neck pain, back pain August 16 2015,  Minimal elevation of troponin 0.3 Cardiac catheterization showing Nonspecific ST abnormality, minimal ST elevation inferior and anterolateral leads This showed severe distal RCA disease, DES placed to distal right PL branch  Echocardiogram reviewed showed ejection fraction 50-55%, inferior posterior hypokinesis  Previously  traveling in [place] developed chest pain and arm pain similar to previous anginal  symptoms  04/05/2016 underwent cardiac catheterization for stable anginal symptoms New stent to PLV  BS Promus premiere 2.75 x 8 mm While he was in the hospital he had bradycardia, metoprolol was held, losartan also held  CT scan in the hospital in [place] showing metastases to bone Monitoring his blood pressure at home systolic pressure 902I, heart rate stable  cardiac catheterization March 20th 2008 that showed 40% mid LAD disease, 40% it RCA disease. Medical management was recommended.  PMH:   has a past medical history of Back pain (10/09/2012), Bone cancer ([place]), CAD (coronary artery disease), Cancer associated pain, Depression, History of echocardiogram, History of kidney stones, Hyperlipidemia, Hypertension, Joint pain, Prostate cancer ([place]), Right arm pain (01/10/2016), Right foot pain (01/10/2016), Right leg pain (01/10/2016), and Tobacco abuse.  PSH:    Past Surgical History:  Procedure Laterality Date  . CARD[facility]AC CAT[facility]IZATION     [facility]  . CARDIAC CATHETERIZATION N/A 08/16/2015   Procedure: Left Heart Cath and Coronary Angiography;  Surgeon: Yolonda Kida, MD;  Location: [facility] LAB;  Service: Cardiovascular;  Laterality: N/A;  . CARDIAC CATHETERIZATION N/A 08/16/2015   Procedure: Coronary Stent Intervention;  Surgeon: Yolonda Kida, MD;  Location: [facility] CV LAB;  Service: Cardiovascular;  Laterality: N/A;  . CERVIX SURGERY    . PROSTATECTOMY    . SPINE SURGERY    . TONSILLECTOMY    . WRIST SURGERY      Current Outpatient Medications  Medication Sig Dispense Refill  . acetaminophen (TYLENOL) 500 MG tablet Take 1,000 mg by mouth every 8 (eight)  hours as needed.     Marland Kitchen albuterol (PROVENTIL HFA;VENTOLIN HFA) 108 (90 Base) MCG/ACT inhaler Inhale 1-2 puffs into the lungs every 6 (six) hours as needed for wheezing or shortness of breath. 1 Inhaler 0  . aspirin 81 MG tablet Take 81 mg by mouth daily.    Marland Kitchen atorvastatin (LIPITOR) 80 MG tablet Take 1 tablet (80  mg total) by mouth daily at 6 PM. 90 tablet 3  . clopidogrel (PLAVIX) 75 MG tablet Take 1 tablet (75 mg total) by mouth daily. 90 tablet 3  . fentaNYL (DURAGESIC) 100 MCG/HR Place 1 patch onto the skin every other day. Along with 2mg patch for total of 125 mcg every 2 days. 15 patch 0  . fentaNYL (DURAGESIC) 25 MCG/HR Place 1 patch onto the skin every other day. Use this along with fentanyl patch 100 mcg [total 125 mcg] every 2 days 15 patch 0  . gabapentin (NEURONTIN) 100 MG capsule Take 1 capsule (100 mg total) by mouth 3 (three) times daily. 90 capsule 5  . metoprolol tartrate (LOPRESSOR) 25 MG tablet TAKE 1/2 TABLET BY MOUTH 2 TIMES DAILY. 90 tablet 1  . naloxone (NARCAN) nasal spray 4 mg/0.1 mL 1 spray into nostril upon signs of opioid overdose. Call 911. May repeat once if no response within 2-3 minutes. 1 kit 0  . nitroGLYCERIN (NITROSTAT) 0.4 MG SL tablet Place 1 tablet (0.4 mg total) under the tongue every 5 (five) minutes as needed. 25 tablet 3  . oxyCODONE (ROXICODONE) 15 MG immediate release tablet Take 1 tablet (15 mg total) by mouth every 6 (six) hours as needed. for pain 90 tablet 0  . polyethylene glycol (MIRALAX / GLYCOLAX) packet Take 17 g by mouth daily as needed for moderate constipation.    . predniSONE (DELTASONE) 5 MG tablet Take 1 tablet (5 mg total) by mouth daily with breakfast. 30 tablet 3  . promethazine (PHENERGAN) 25 MG tablet TAKE 1/2 TO 1 TABLET (12.5-25 MG TOTAL) BY MOUTH EVERY 8 (EIGHT) HOURS AS NEEDED FOR NAUSEA OR VOMITING. 60 tablet 3  . traZODone (DESYREL) 50 MG tablet Take 1-2 tablets (50-100 mg total) by mouth at bedtime as needed for sleep. 60 tablet 2   No current facility-administered medications for this visit.    Allergies:   Ditropan [oxybutynin]   Social History:  The patient  reports that he has been smoking cigarettes. He has a 20.00 pack-year smoking history. His smokeless tobacco use includes chew. He reports current alcohol use. He reports that  he does not use drugs.   Family History:   family history includes Heart attack in his father and mother; Hypertension in his mother.    Review of Systems: Review of Systems  Constitutional: Negative.   HENT: Negative.   Respiratory: Negative.   Cardiovascular: Negative.   Gastrointestinal: Negative.   Musculoskeletal: Negative.   Neurological: Negative.   Psychiatric/Behavioral: Negative.   All other systems reviewed and are negative.   PHYSICAL EXAM: VS:  BP 130/90 (BP Location: Left Arm, Patient Position: Sitting, Cuff Size: Normal)   Pulse 83   Ht '5\' 11"'  (1.803 m)   Wt 158 lb 8 oz (71.9 kg)   SpO2 98%   BMI 22.11 kg/m  , BMI Body mass index is 22.11 kg/m. Constitutional:  oriented to person, place, and time. No distress. Thin HENT:  Head: Normocephalic and atraumatic.  Eyes:  no discharge. No scleral icterus.  Neck: Normal range of motion. Neck supple. No JVD present.  Cardiovascular: Normal rate, regular rhythm, normal heart sounds and intact distal pulses. Exam reveals no gallop and no friction rub. No edema No murmur heard. Pulmonary/Chest: Effort normal and breath sounds normal. No stridor. No respiratory distress.  no wheezes.  no rales.  no tenderness.  Abdominal: Soft.  no distension.  no tenderness.  Musculoskeletal: Normal range of motion.  no  tenderness or deformity.  Neurological:  normal muscle tone. Coordination normal. No atrophy Skin: Skin is warm and dry. No rash noted. not diaphoretic.  Psychiatric:  normal mood and affect. behavior is normal. Thought content normal.   Recent Labs: 07/08/2019: ALT 9; BUN 10; Creatinine, Ser 0.83; Hemoglobin 10.3; Platelets 212; Potassium 3.6; Sodium 136    Lipid Panel Lab Results  Component Value Date   CHOL 266 (H) 10/15/2018   HDL 54 10/15/2018   LDLCALC 179 (H) 10/15/2018   TRIG 165 (H) 10/15/2018      Wt Readings from Last 3 Encounters:  07/15/19 158 lb 8 oz (71.9 kg)  07/09/19 160 lb 12.8 oz (72.9 kg)   06/24/19 162 lb (73.5 kg)      ASSESSMENT AND PLAN:  Stable angina stent to the RCA in August 2017  stent placed to the RCA early April 2018 Currently with no symptoms of angina. No further workup at this time. Continue current medication regimen.  Coronary artery disease involving native coronary artery of native heart without angina pectoris - Plan: EKG 12-Lead Smoking cessation recommended, cholesterol well above goal 2020 Back on Lipitor 80 daily Goal LDL less than 70  ST elevation myocardial infarction in August 2017 Drug eluding stent to his distal RCA, PL branch Stay on aspirin Plavix  Pure hypercholesterolemia Recommended he add Zetia to his Lipitor 80 mg daily Goal LDL less than 70  Essential (primary) hypertension - Plan: EKG 12-Lead Blood pressure stable despite weight loss Recommended for further weight loss that he closely monitor blood pressure  Malignant neoplasm of prostate ([place]) Followed by oncology, on chemotherapy Long discussion concerning side effects from the medications, weight loss over the past several years Stressed importance of maintaining his weight, monitoring his blood pressure with weight loss  Smoker Recommended smoking cessation Declining Chantix    Total encounter time more than 25 minutes  Greater than 50% was spent in counseling and coordination of care with the patient    No orders of the defined types were placed in this encounter.    Signed, Esmond Plants, M.D., Ph.D. 07/15/2019  [place], [place]

## 2019-07-15 ENCOUNTER — Ambulatory Visit (INDEPENDENT_AMBULATORY_CARE_PROVIDER_SITE_OTHER): Payer: 59 | Admitting: Cardiovascular Disease

## 2019-07-15 ENCOUNTER — Other Ambulatory Visit: Payer: Self-pay

## 2019-07-15 ENCOUNTER — Encounter: Payer: Self-pay | Admitting: Cardiovascular Disease

## 2019-07-15 VITALS — BP 130/90 | HR 83 | Ht 71.0 in | Wt 158.5 lb

## 2019-07-15 DIAGNOSIS — C61 Malignant neoplasm of prostate: Secondary | ICD-10-CM

## 2019-07-15 DIAGNOSIS — R0602 Shortness of breath: Secondary | ICD-10-CM | POA: Diagnosis not present

## 2019-07-15 DIAGNOSIS — I10 Essential (primary) hypertension: Secondary | ICD-10-CM

## 2019-07-15 DIAGNOSIS — I25118 Atherosclerotic heart disease of native coronary artery with other forms of angina pectoris: Secondary | ICD-10-CM | POA: Diagnosis not present

## 2019-07-15 DIAGNOSIS — E785 Hyperlipidemia, unspecified: Secondary | ICD-10-CM | POA: Diagnosis not present

## 2019-07-15 DIAGNOSIS — Z720 Tobacco use: Secondary | ICD-10-CM

## 2019-07-15 NOTE — Patient Instructions (Signed)

## 2019-07-22 ENCOUNTER — Other Ambulatory Visit: Payer: Self-pay | Admitting: Internal Medicine

## 2019-07-22 MED ORDER — OXYCODONE HCL 15 MG PO TABS: 15.0000 mg | ORAL_TABLET | Freq: Four times a day (QID) | ORAL | 0 refills | Status: DC | PRN

## 2019-07-24 NOTE — Progress Notes (Signed)
DISCONTINUE ON PATHWAY REGIMEN - Prostate     A cycle is every 21 days:     Docetaxel      Prednisone   **Always confirm dose/schedule in your pharmacy ordering system**  REASON: Disease Progression PRIOR TREATMENT: POS37: Docetaxel 75 mg/m2 q21 Days + Prednisone 5 mg BID Until Progression or Toxicity TREATMENT RESPONSE: Progressive Disease (PD)  START ON PATHWAY REGIMEN - Prostate     A cycle is every 21 days.:     Cabazitaxel      Prednisone   **Always confirm dose/schedule in your pharmacy ordering system**  Patient Characteristics: Adenocarcinoma, Recurrent/New Systemic Disease, Castration Resistant, M1, Prior Novel Hormonal Agent, No Molecular Alteration or Targeted Therapy Exhausted, Prior Docetaxel/Docetaxel Not Indicated Histology: Adenocarcinoma Therapeutic Status: Recurrent/New Systemic Disease  Intent of Therapy: Non-Curative / Palliative Intent, Discussed with Patient

## 2019-07-28 ENCOUNTER — Other Ambulatory Visit: Payer: Self-pay

## 2019-07-28 ENCOUNTER — Inpatient Hospital Stay (HOSPITAL_BASED_OUTPATIENT_CLINIC_OR_DEPARTMENT_OTHER): Payer: 59 | Admitting: Hospice and Palliative Medicine

## 2019-07-28 DIAGNOSIS — C61 Malignant neoplasm of prostate: Secondary | ICD-10-CM

## 2019-07-28 DIAGNOSIS — C7951 Secondary malignant neoplasm of bone: Secondary | ICD-10-CM

## 2019-07-28 DIAGNOSIS — Z7189 Other specified counseling: Secondary | ICD-10-CM

## 2019-07-28 NOTE — Progress Notes (Signed)
Virtual Visit via Telephone Note  I connected with Robert Short on 07/28/19 at 10:30 AM EDT by telephone and verified that I am speaking with the correct person using two identifiers.   I discussed the limitations, risks, security and privacy concerns of performing an evaluation and management service by telephone and the availability of in person appointments. I also discussed with the patient that there may be a patient responsible charge related to this service. The patient expressed understanding and agreed to proceed.   History of Present Illness: Mr. Robert Short is a 69 y.o.malewith multiple medical problems including stage IV prostate cancer metastatic to bone status post Xtandi.Currently on treatment with docetaxel.  Patient has had multiple symptoms including fatigue and pain. He was referred to palliative care to help with symptom management and to address goals.   Observations/Objective: Called spoke with patient on the phone.  He reports overall doing about the same.  He endorses chronic fatigue.  He stopped smoking cigarettes 2 days ago and says he feels some better.  We discussed the role of exercise and fatigue management.  We also discussed resources including OT eval and/or care program.  Patient says he has good days and bad in regards to his pain.  He continues to take oxycodone about every 6 hours.  Oxycodone was refilled by me last week.  PDMP reviewed.  Note the PSA has been up trending over the past month.  Patient is being rotated to chemotherapy.  Assessment and Plan: Metastatic prostate cancer -on systemic treatment.  Followed by Dr. Rogue Bussing.    Neoplasm related pain -continue fentanyl/oxycodone  Follow Up Instructions: Follow-up virtual visit in 1-2 months   I discussed the assessment and treatment plan with the patient. The patient was provided an opportunity to ask questions and all were answered. The patient agreed with the plan and demonstrated an  understanding of the instructions.   The patient was advised to call back or seek an in-person evaluation if the symptoms worsen or if the condition fails to improve as anticipated.  I provided 7 minutes of non-face-to-face time during this encounter.   Irean Hong, NP

## 2019-07-29 ENCOUNTER — Inpatient Hospital Stay: Payer: 59

## 2019-07-29 ENCOUNTER — Other Ambulatory Visit: Payer: Self-pay

## 2019-07-29 DIAGNOSIS — Z5112 Encounter for antineoplastic immunotherapy: Secondary | ICD-10-CM | POA: Diagnosis not present

## 2019-07-29 DIAGNOSIS — Z79899 Other long term (current) drug therapy: Secondary | ICD-10-CM | POA: Diagnosis not present

## 2019-07-29 DIAGNOSIS — C61 Malignant neoplasm of prostate: Secondary | ICD-10-CM | POA: Diagnosis not present

## 2019-07-29 DIAGNOSIS — C7951 Secondary malignant neoplasm of bone: Secondary | ICD-10-CM

## 2019-07-29 DIAGNOSIS — G893 Neoplasm related pain (acute) (chronic): Secondary | ICD-10-CM | POA: Diagnosis not present

## 2019-07-29 LAB — COMPREHENSIVE METABOLIC PANEL
ALT: 8 U/L (ref 0–44)
AST: 17 U/L (ref 15–41)
Albumin: 3.1 g/dL — ABNORMAL LOW (ref 3.5–5.0)
Alkaline Phosphatase: 34 U/L — ABNORMAL LOW (ref 38–126)
Anion gap: 7 (ref 5–15)
BUN: 8 mg/dL (ref 8–23)
CO2: 25 mmol/L (ref 22–32)
Calcium: 8.4 mg/dL — ABNORMAL LOW (ref 8.9–10.3)
Chloride: 104 mmol/L (ref 98–111)
Creatinine, Ser: 0.77 mg/dL (ref 0.61–1.24)
GFR calc Af Amer: >60 mL/min (ref >60)
GFR calc non Af Amer: >60 mL/min (ref >60)
Glucose, Bld: 114 mg/dL — ABNORMAL HIGH (ref 70–99)
Potassium: 3.8 mmol/L (ref 3.5–5.1)
Sodium: 136 mmol/L (ref 135–145)
Total Bilirubin: 0.4 mg/dL (ref 0.3–1.2)
Total Protein: 5.1 g/dL — ABNORMAL LOW (ref 6.5–8.1)

## 2019-07-29 LAB — CBC WITH DIFFERENTIAL/PLATELET
Abs Immature Granulocytes: 0.05 10*3/uL (ref 0.00–0.07)
Basophils Absolute: 0 10*3/uL (ref 0.0–0.1)
Basophils Relative: 1 %
Eosinophils Absolute: 0 10*3/uL (ref 0.0–0.5)
Eosinophils Relative: 1 %
HCT: 33.3 % — ABNORMAL LOW (ref 39.0–52.0)
Hemoglobin: 10.6 g/dL — ABNORMAL LOW (ref 13.0–17.0)
Immature Granulocytes: 1 %
Lymphocytes Relative: 18 %
Lymphs Abs: 1.1 10*3/uL (ref 0.7–4.0)
MCH: 30.1 pg (ref 26.0–34.0)
MCHC: 31.8 g/dL (ref 30.0–36.0)
MCV: 94.6 fL (ref 80.0–100.0)
Monocytes Absolute: 0.6 10*3/uL (ref 0.1–1.0)
Monocytes Relative: 10 %
Neutro Abs: 4.1 10*3/uL (ref 1.7–7.7)
Neutrophils Relative %: 69 %
Platelets: 204 10*3/uL (ref 150–400)
RBC: 3.52 MIL/uL — ABNORMAL LOW (ref 4.22–5.81)
RDW: 15.5 % (ref 11.5–15.5)
WBC: 5.9 10*3/uL (ref 4.0–10.5)
nRBC: 0 % (ref 0.0–0.2)

## 2019-07-30 ENCOUNTER — Ambulatory Visit
Admission: RE | Admit: 2019-07-30 | Discharge: 2019-07-30 | Disposition: A | Discharge status: Home / Self Care | Payer: 59 | Source: Ambulatory Visit | Attending: Internal Medicine | Admitting: Internal Medicine

## 2019-07-30 ENCOUNTER — Encounter: Payer: Self-pay | Admitting: Internal Medicine

## 2019-07-30 ENCOUNTER — Inpatient Hospital Stay (HOSPITAL_BASED_OUTPATIENT_CLINIC_OR_DEPARTMENT_OTHER): Payer: 59 | Admitting: Internal Medicine

## 2019-07-30 ENCOUNTER — Inpatient Hospital Stay: Payer: 59

## 2019-07-30 VITALS — BP 145/100 | HR 86 | Temp 98.3°F | Resp 18 | Ht 71.0 in | Wt 167.2 lb

## 2019-07-30 DIAGNOSIS — G893 Neoplasm related pain (acute) (chronic): Secondary | ICD-10-CM

## 2019-07-30 DIAGNOSIS — C61 Malignant neoplasm of prostate: Secondary | ICD-10-CM

## 2019-07-30 DIAGNOSIS — M7989 Other specified soft tissue disorders: Secondary | ICD-10-CM | POA: Diagnosis not present

## 2019-07-30 DIAGNOSIS — Z79899 Other long term (current) drug therapy: Secondary | ICD-10-CM | POA: Diagnosis not present

## 2019-07-30 DIAGNOSIS — C7951 Secondary malignant neoplasm of bone: Secondary | ICD-10-CM | POA: Diagnosis not present

## 2019-07-30 DIAGNOSIS — R2243 Localized swelling, mass and lump, lower limb, bilateral: Secondary | ICD-10-CM | POA: Diagnosis not present

## 2019-07-30 DIAGNOSIS — Z5112 Encounter for antineoplastic immunotherapy: Secondary | ICD-10-CM | POA: Diagnosis not present

## 2019-07-30 DIAGNOSIS — I25118 Atherosclerotic heart disease of native coronary artery with other forms of angina pectoris: Secondary | ICD-10-CM

## 2019-07-30 LAB — PSA: Prostatic Specific Antigen: 252 ng/mL — ABNORMAL HIGH (ref 0.00–4.00)

## 2019-07-30 NOTE — Assessment & Plan Note (Addendum)
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 ADT; Taxotere q 2 weeks; MAY 2021- PET- Improved; however PSA- rising 70 to 250.   #Given the progressive PSA/symptoms recommend discontinuation of Taxotere.  Plan to start cabazitaxel; however hold cabazitaxel today-see acute issues below  #Bilateral swelling in the legs-question DVT.  Hold chemotherapy; stat ultrasound bilateral lower extremities.   # "confused"-post? Eligard [on May 28th]; currently resolved.  Plan to start [place] start in August mid.   # Malignancy related pain- continue fentanyl patch hydrocodone;. Continue fentanyl patch to every 48 hours and continue oxycodone prn; STABLE.   # Quit smoking-congratulated on quitting smoking.  # DISPOSITION:  # HOLD chemo today;cancel d-2 neulasta # STAT US dopplers- Bil LE. # in 2 weeks- MD;  Reva Bores- cbc/cmp/PSA 1 day prior in [place];] Cabazitaxel [NEW]; D-2 neulasta-Dr.B    Addendum: Bilateral lower extremity ultrasound negative for DVT.  Discussed results with the patient/wife.  Recommend leg elevation/compression stockings

## 2019-07-30 NOTE — Progress Notes (Signed)
[place] OFFICE PROGRESS NOTE  Patient Care Team: Robert Hauser, DO as PCP - General (Family Medicine) Robert Situ Robert November, MD as PCP - Cardiology (Cardiology) Robert Merritts, MD as Consulting Physician (Cardiology) Robert Sickle, [name]  Cancer Staging No matching staging information was found for the patient.   Oncology History Overview Note  # 2011- PROSTATE CANCER [Gleason 3+4]; Robert Short Prostatectomy [ also involved bladder neck/ECP; Robert Short]; July 2014- Biochemical recurrence [PSA 14]- started on Zoladex [Robert Short]; lost to follow up.  # JAN 2017- STAGE IV METASTATIC PROSTATE Cancer to Bone- Feb 13th, 2017-  Lupron q 46m[~end of feb]; PSA: 1021; Declined Chemo; April 2017 [xofigo x6; Robert Short]; AUG 2017- Zytiga + Prednisone BID. Bone scan-Jan 2018- improved skeletal metastases.   # MAY 1st 2019- START X-tandi [stopped zytiga/PSA- 8.8/rising]  #August 23, 2018-stop Xtandi [intolerance-fatigue mental fogginess]  #Mid September 2020-apalutamide; stopped mid-October poor tolerance/progression  # 11/01/2018- -Taxotere weekly [consent]  # Mets to bone- start X-geva q 69M [May 30th]; hold dental issues/infections  # Smoker/ chronic pain/pain clinic   #[April 2018; [place]]- Robert Short stenting; on Plavix; --------------------------------------------------------------  DIAGNOSIS: '[ ]'  Castrate resistant prostate cancer  STAGE:   4    ;GOALS: Palliative  CURRENT/MOST RECENT THERAPY-Late Oct 2020- Taxotere weekly [consent]    [place] cancer [place] to bone ([facility]  12/08/2014 Initial Diagnosis   Prostate cancer metastatic to bone ([facility]   11/01/2018 - 07/09/2019 Chemotherapy   The patient had DOCEtaxel (TAXOTERE) 70 mg in sodium chloride 0.9 % 150 mL chemo infusion, 36 mg/m2 = 70 mg, Intravenous,  Once, 19 of 21 cycles Administration: 70 mg (11/08/2018), 70 mg (11/22/2018), 70 mg (12/13/2018), 70 mg  (12/06/2018), 70 mg (12/31/2018), 70 mg (01/07/2019), 70 mg (01/29/2019), 70 mg (12/20/2018), 70 mg (02/06/2019), 70 mg (02/14/2019), 70 mg (02/28/2019), 70 mg (03/14/2019), 70 mg (03/28/2019), 70 mg (04/15/2019), 70 mg (04/29/2019), 70 mg (05/27/2019), 70 mg (06/24/2019), 70 mg (07/09/2019)  for chemotherapy treatment.    08/13/2019 -  Chemotherapy   The patient had pegfilgrastim (NEULASTA) injection 6 mg, 6 mg, Subcutaneous, Once, 0 of 4 cycles cabazitaxel (JEVTANA) 38 mg in sodium chloride 0.9 % 250 mL chemo infusion, 20 mg/m2, Intravenous,  Once, 0 of 4 cycles  for chemotherapy treatment.     INTERVAL HISTORY:  [name]89y.o.  male pleasant patient above history of metastatic castrate resistant prostate cancer noted to have progressed on Taxotere is here for follow-up.  Patient is here to proceed with cabazitaxel.  Right leg swelling/cramping bilateral lower extremities.  Denies any trauma.  Denies any worsening shortness of breath or cough.  Continues to have chronic moderate fatigue.  Chronic joint pains not any worse.  He continues to be on narcotic pain medication-fentanyl; oxycodone.   Review of Systems  Constitutional: Positive for malaise/fatigue and weight loss. Negative for chills, diaphoresis and fever.  HENT: Negative for nosebleeds and sore throat.   Eyes: Negative for double vision.  Respiratory: Positive for shortness of breath. Negative for cough, hemoptysis, sputum production and wheezing.   Cardiovascular: Positive for leg swelling. Negative for chest pain, palpitations and orthopnea.  Gastrointestinal: Positive for nausea. Negative for abdominal pain, blood in stool, diarrhea, heartburn, melena and vomiting.  Genitourinary: Negative for dysuria, frequency and urgency.  Musculoskeletal: Positive for back pain and joint pain.  Skin: Negative for itching.  Neurological: Negative for dizziness, tingling, focal weakness, weakness and headaches.  Endo/Heme/Allergies: Does not  bruise/bleed  easily.  Psychiatric/Behavioral: Negative for depression. The patient is not nervous/anxious and does not have insomnia.       PAST MEDICAL HISTORY :  Past Medical History:  Diagnosis Date  . Back pain 10/09/2012  . Bone cancer ([place])   . CAD (coronary artery disease)    a. 08/2015 PCI: LM nl, LAD 20d, LCX nl, RCA 58m RPAV 95 (3.0x18 Xience Alpine DES);  b. 04/2016 PCI ([facility]: RPL 95 (2.75x8 Promus Premier DES).  . Cancer associated pain   . Depression   . History of echocardiogram    a. 08/2016 Echo: EF 50-55%.  .Marland KitchenHistory of kidney stones   . Hyperlipidemia   . Hypertension   . Joint pain   . Prostate cancer ([facility]    a.  Robert Short prostatectomy (Robert Short);  b. bone mets noted 04/2016.  . Right arm pain 01/10/2016  . Right foot pain 01/10/2016  . Right leg pain 01/10/2016  . Tobacco abuse     PAST SURGICAL HISTORY :   Past Surgical History:  Procedure Laterality Date  . CARDIAC CATHETERIZATION     [facility]  . CARDIAC CATHETERIZATION N/A 08/16/2015   Procedure: Left Heart Cath and Coronary Angiography;  Surgeon: DYolonda Kida MD;  [facility];  Service: Cardiovascular;  Laterality: N/A;  . CARDIAC CATHETERIZATION N/A 08/16/2015   Procedure: Coronary Stent Intervention;  Surgeon: DYolonda Kida MD;  [facility];  Service: Cardiovascular;  Laterality: N/A;  . CERVIX SURGERY    . PROSTATECTOMY    . SPINE SURGERY    . TONSILLECTOMY    . WRIST SURGERY      FAMILY HISTORY :   Family History  Problem Relation Age of Onset  . Heart attack Mother   . Hypertension Mother   . Heart attack Father     SOCIAL HISTORY:   Social History   Tobacco Use  . Smoking status: Former Smoker    Packs/day: 0.50    Years: 40.00    Pack years: 20.00    Types: Cigarettes  . Smokeless tobacco: Current User    Types: Chew  . Tobacco comment: 1 pack every couple days   Vaping Use  . Vaping Use: Never used  Substance Use Topics  . Alcohol  use: Yes    Comment: occasionally  . Drug use: No    ALLERGIES:  is allergic to ditropan [oxybutynin].  MEDICATIONS:  Current Outpatient Medications  Medication Sig Dispense Refill  . acetaminophen (TYLENOL) 500 MG tablet Take 1,000 mg by mouth every 8 (eight) hours as needed.     .Marland Kitchenalbuterol (PROVENTIL HFA;VENTOLIN HFA) 108 (90 Base) MCG/ACT inhaler Inhale 1-2 puffs into the lungs every 6 (six) hours as needed for wheezing or shortness of breath. 1 Inhaler 0  . aspirin 81 MG tablet Take 81 mg by mouth daily.    .Marland Kitchenatorvastatin (LIPITOR) 80 MG tablet Take 1 tablet (80 mg total) by mouth daily at 6 PM. 90 tablet 3  . clopidogrel (PLAVIX) 75 MG tablet Take 1 tablet (75 mg total) by mouth daily. 90 tablet 3  . fentaNYL (DURAGESIC) 100 MCG/HR Place 1 patch onto the skin every other day. Along with 221m patch for total of 125 mcg every 2 days. 15 patch 0  . fentaNYL (DURAGESIC) 25 MCG/HR Place 1 patch onto the skin every other day. Use this along with fentanyl patch 100 mcg [total 125 mcg] every 2 days 15 patch 0  . gabapentin (NEURONTIN)  100 MG capsule Take 1 capsule (100 mg total) by mouth 3 (three) times daily. 90 capsule 5  . metoprolol tartrate (LOPRESSOR) 25 MG tablet TAKE 1/2 TABLET BY MOUTH 2 TIMES DAILY. 90 tablet 1  . naloxone (NARCAN) nasal spray 4 mg/0.1 mL 1 spray into nostril upon signs of opioid overdose. Call 911. May repeat once if no response within 2-3 minutes. 1 kit 0  . nitroGLYCERIN (NITROSTAT) 0.4 MG SL tablet Place 1 tablet (0.4 mg total) under the tongue every 5 (five) minutes as needed. 25 tablet 3  . oxyCODONE (ROXICODONE) 15 MG immediate release tablet Take 1 tablet (15 mg total) by mouth every 6 (six) hours as needed. for pain 90 tablet 0  . polyethylene glycol (MIRALAX / GLYCOLAX) packet Take 17 g by mouth daily as needed for moderate constipation.    . predniSONE (DELTASONE) 5 MG tablet Take 1 tablet (5 mg total) by mouth daily with breakfast. 30 tablet 3  .  promethazine (PHENERGAN) 25 MG tablet TAKE 1/2 TO 1 TABLET (12.5-25 MG TOTAL) BY MOUTH EVERY 8 (EIGHT) HOURS AS NEEDED FOR NAUSEA OR VOMITING. 60 tablet 3  . traZODone (DESYREL) 50 MG tablet Take 1-2 tablets (50-100 mg total) by mouth at bedtime as needed for sleep. 60 tablet 2  . fentaNYL (DURAGESIC) 100 MCG/HR PLACE 1 PATCH ONTO THE SKIN EVERY OTHER DAY. ALONG WITH 25MCG PATCH FOR TOTAL OF 125 MCG EVERY 2 DAYS. 15 patch 0  . fentaNYL (DURAGESIC) 25 MCG/HR PLACE 1 PATCH ONTO THE SKIN EVERY OTHER DAY. USE THIS ALONG WITH FENTANYL PATCH 100 MCG [TOTAL 125 MCG] EVERY 2 DAYS 15 patch 0   No current facility-administered medications for this visit.    PHYSICAL EXAMINATION: ECOG PERFORMANCE STATUS: 1 - Symptomatic but completely ambulatory  BP (!) 145/100 (BP Location: Left Arm, Patient Position: Sitting, Cuff Size: Normal)   Pulse 86   Temp 98.3 F (36.8 C) (Tympanic)   Resp 18   Ht '5\' 11"'  (1.803 m)   Wt 167 lb 3.2 oz (75.8 kg)   SpO2 98%   BMI 23.32 kg/m   Filed Weights   07/30/19 1114  Weight: 167 lb 3.2 oz (75.8 kg)    Physical Exam Constitutional:      Comments:  Frail-appearing Caucasian male patient.  He is in a walking with a cane.  He is accompanied by his wife.  HENT:     Head: Normocephalic and atraumatic.     Mouth/Throat:     Pharynx: No oropharyngeal exudate.  Eyes:     Pupils: Pupils are equal, round, and reactive to light.  Cardiovascular:     Rate and Rhythm: Normal rate and regular rhythm.  Pulmonary:     Effort: No respiratory distress.     Breath sounds: No wheezing.  Abdominal:     General: Bowel sounds are normal. There is no distension.     Palpations: Abdomen is soft. There is no mass.     Tenderness: There is no abdominal tenderness. There is no guarding or rebound.  Musculoskeletal:        General: No tenderness. Normal range of motion.     Cervical back: Normal range of motion and neck supple.     Comments: Bilateral lower extremity swelling  tenderness on deep palpation.  Skin:    General: Skin is warm.     Comments: Macular rash on hands  Neurological:     Mental Status: He is alert and oriented to person, place, and  time.  Psychiatric:        Mood and Affect: Affect normal.     LABORATORY DATA:  I have reviewed the data as listed    Component Value Date/Time   NA 136 07/29/2019 1335   NA 140 07/13/2013 1542   K 3.8 07/29/2019 1335   K 4.3 07/13/2013 1542   CL 104 07/29/2019 1335   CL 107 07/13/2013 1542   CO2 25 07/29/2019 1335   CO2 26 07/13/2013 1542   GLUCOSE 114 (H) 07/29/2019 1335   GLUCOSE 106 (H) 07/13/2013 1542   BUN 8 07/29/2019 1335   BUN 11 07/13/2013 1542   CREATININE 0.77 07/29/2019 1335   CREATININE 0.76 10/15/2017 1001   CALCIUM 8.4 (L) 07/29/2019 1335   CALCIUM 9.9 07/13/2013 1542   PROT 5.1 (L) 07/29/2019 1335   PROT 8.1 07/13/2013 1542   ALBUMIN 3.1 (L) 07/29/2019 1335   ALBUMIN 4.3 07/13/2013 1542   AST 17 07/29/2019 1335   AST 26 07/13/2013 1542   ALT 8 07/29/2019 1335   ALT 32 07/13/2013 1542   ALKPHOS 34 (L) 07/29/2019 1335   ALKPHOS 74 07/13/2013 1542   BILITOT 0.4 07/29/2019 1335   BILITOT 0.4 07/13/2013 1542   GFRNONAA >60 07/29/2019 1335   GFRNONAA 95 10/15/2017 1001   GFRAA >60 07/29/2019 1335   GFRAA 110 10/15/2017 1001    No results found for: SPEP, UPEP  Lab Results  Component Value Date   WBC 5.9 07/29/2019   NEUTROABS 4.1 07/29/2019   HGB 10.6 (L) 07/29/2019   HCT 33.3 (L) 07/29/2019   MCV 94.6 07/29/2019   PLT 204 07/29/2019      Chemistry      Component Value Date/Time   NA 136 07/29/2019 1335   NA 140 07/13/2013 1542   K 3.8 07/29/2019 1335   K 4.3 07/13/2013 1542   CL 104 07/29/2019 1335   CL 107 07/13/2013 1542   CO2 25 07/29/2019 1335   CO2 26 07/13/2013 1542   BUN 8 07/29/2019 1335   BUN 11 07/13/2013 1542   CREATININE 0.77 07/29/2019 1335   CREATININE 0.76 10/15/2017 1001      Component Value Date/Time   CALCIUM 8.4 (L) 07/29/2019 1335    CALCIUM 9.9 07/13/2013 1542   ALKPHOS 34 (L) 07/29/2019 1335   ALKPHOS 74 07/13/2013 1542   AST 17 07/29/2019 1335   AST 26 07/13/2013 1542   ALT 8 07/29/2019 1335   ALT 32 07/13/2013 1542   BILITOT 0.4 07/29/2019 1335   BILITOT 0.4 07/13/2013 1542     RADIOGRAPHIC STUDIES: I have personally reviewed the radiological images as listed and agreed with the findings in the report. No results found.   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 ADT; Taxotere q 2 weeks; MAY 2021- PET- Improved; however PSA- rising 70 to 250.   #Given the progressive PSA/symptoms recommend discontinuation of Taxotere.  Plan to start cabazitaxel; however hold cabazitaxel today-see acute issues below  #Bilateral swelling in the legs-question DVT.  Hold chemotherapy; stat ultrasound bilateral lower extremities.   # "confused"-post? Eligard [on May 28th]; currently resolved.  Plan to start [place] start in August mid.   # Malignancy related pain- continue fentanyl patch hydrocodone;. Continue fentanyl patch to every 48 hours and continue oxycodone prn; STABLE.   # Quit smoking-congratulated on quitting smoking.  # DISPOSITION:  # HOLD chemo today;cancel d-2 neulasta # STAT US dopplers- Bil  LE. # in 2 weeks- MD;  Robert Short- cbc/cmp/PSA 1 day prior in [place];] Cabazitaxel [NEW]; D-2 neulasta-Dr.B    Addendum: Bilateral lower extremity ultrasound negative for DVT.  Discussed results with the patient/wife.  Recommend leg elevation/compression stockings  Orders Placed This Encounter  Procedures  . US Venous Img Lower Bilateral (DVT)    Standing Status:   Future    Number of Occurrences:   1    Standing Expiration Date:   07/29/2020    Order Specific Question:   Reason for Exam (SYMPTOM  OR DIAGNOSIS REQUIRED)    Answer:   leg swelling- Bilateral.    Order Specific Question:   Preferred imaging location?    Answer:   [facility] [place]     Order Specific Question:   Call Results- Best Contact Number?    Answer:   [phone] do not Hold  . CBC with Differential/Platelet    Standing Status:   Future    Standing Expiration Date:   07/29/2020  . Comprehensive metabolic panel    Standing Status:   Future    Standing Expiration Date:   07/29/2020  . PSA    Standing Status:   Future    Standing Expiration Date:   07/29/2020  . Comprehensive metabolic panel    Standing Status:   Future    Standing Expiration Date:   07/29/2020  . CBC with Differential    Standing Status:   Future    Standing Expiration Date:   07/29/2020  . PSA    Standing Status:   Future    Standing Expiration Date:   07/29/2020   All questions were answered. The patient knows to call the clinic with any problems, questions or concerns.      Robert Sickle, MD 08/10/2019 8:03 PM

## 2019-07-31 ENCOUNTER — Telehealth: Payer: Self-pay | Admitting: Internal Medicine

## 2019-07-31 ENCOUNTER — Inpatient Hospital Stay: Payer: 59

## 2019-07-31 NOTE — Telephone Encounter (Signed)
On 7/30-spoke to patient regarding results of ultrasound-negative for blood clot.  Recommend compression stockings; plan to start chemotherapy in 2 weeks. FYI

## 2019-08-04 ENCOUNTER — Other Ambulatory Visit: Payer: Self-pay | Admitting: Internal Medicine

## 2019-08-04 ENCOUNTER — Other Ambulatory Visit: Payer: Self-pay | Admitting: Oncology

## 2019-08-04 DIAGNOSIS — G893 Neoplasm related pain (acute) (chronic): Secondary | ICD-10-CM

## 2019-08-04 DIAGNOSIS — C61 Malignant neoplasm of prostate: Secondary | ICD-10-CM

## 2019-08-04 DIAGNOSIS — C7951 Secondary malignant neoplasm of bone: Secondary | ICD-10-CM

## 2019-08-06 NOTE — Progress Notes (Signed)
Pharmacist Chemotherapy Monitoring - Initial Assessment    Anticipated start date: 08/13/19  Regimen:   Are orders appropriate based on the patients diagnosis, regimen, and cycle? Yes  Does the plan date match the patients scheduled date? Yes  Is the sequencing of drugs appropriate? Yes  Are the premedications appropriate for the patients regimen? Yes  Prior Authorization for treatment is: Approved o If applicable, is the correct biosimilar selected based on the patient's insurance? not applicable  Organ Function and Labs:  Are dose adjustments needed based on the patient's renal function, hepatic function, or hematologic function? No  Are appropriate labs ordered prior to the start of patient's treatment? Yes  Other organ system assessment, if indicated: N/A  The following baseline labs, if indicated, have been ordered: N/A  Dose Assessment:  Are the drug doses appropriate? Yes  Are the following correct: o Drug concentrations Yes o IV fluid compatible with drug Yes o Administration routes Yes o Timing of therapy Yes  If applicable, does the patient have documented access for treatment and/or plans for port-a-cath placement? no  If applicable, have lifetime cumulative doses been properly documented and assessed? no Lifetime Dose Tracking  No doses have been documented on this patient for the following tracked chemicals: Doxorubicin, Epirubicin, Idarubicin, Daunorubicin, Mitoxantrone, Bleomycin, Oxaliplatin, Carboplatin, Liposomal Doxorubicin  o   Toxicity Monitoring/Prevention:  The patient has the following take home antiemetics prescribed: Ondansetron, Prochlorperazine and Lorazepam  The patient has the following take home medications prescribed: N/A  Medication allergies and previous infusion related reactions, if applicable, have been reviewed and addressed. Yes  The patient's current medication list has been assessed for drug-drug interactions with their  chemotherapy regimen. no significant drug-drug interactions were identified on review.  Order Review:  Are the treatment plan orders signed? No  Is the patient scheduled to see a provider prior to their treatment? Yes  I verify that I have reviewed each item in the above checklist and answered each question accordingly.  Adelina Mings 08/06/2019 12:07 PM

## 2019-08-10 MED ORDER — FENTANYL 100 MCG/HR TD PT72: 1.0000 | MEDICATED_PATCH | TRANSDERMAL | 0 refills | Status: DC

## 2019-08-10 MED ORDER — FENTANYL 25 MCG/HR TD PT72: 1.0000 | MEDICATED_PATCH | TRANSDERMAL | 0 refills | Status: DC

## 2019-08-12 ENCOUNTER — Other Ambulatory Visit: Payer: Self-pay

## 2019-08-12 ENCOUNTER — Inpatient Hospital Stay: Payer: 59 | Attending: Internal Medicine

## 2019-08-12 DIAGNOSIS — Z5189 Encounter for other specified aftercare: Secondary | ICD-10-CM | POA: Insufficient documentation

## 2019-08-12 DIAGNOSIS — M7989 Other specified soft tissue disorders: Secondary | ICD-10-CM | POA: Insufficient documentation

## 2019-08-12 DIAGNOSIS — Z5111 Encounter for antineoplastic chemotherapy: Secondary | ICD-10-CM | POA: Diagnosis not present

## 2019-08-12 DIAGNOSIS — C7951 Secondary malignant neoplasm of bone: Secondary | ICD-10-CM | POA: Diagnosis not present

## 2019-08-12 DIAGNOSIS — C61 Malignant neoplasm of prostate: Secondary | ICD-10-CM | POA: Insufficient documentation

## 2019-08-12 DIAGNOSIS — G893 Neoplasm related pain (acute) (chronic): Secondary | ICD-10-CM | POA: Diagnosis not present

## 2019-08-12 DIAGNOSIS — R410 Disorientation, unspecified: Secondary | ICD-10-CM | POA: Diagnosis not present

## 2019-08-12 DIAGNOSIS — Z79899 Other long term (current) drug therapy: Secondary | ICD-10-CM | POA: Diagnosis not present

## 2019-08-12 LAB — CBC WITH DIFFERENTIAL/PLATELET
Abs Immature Granulocytes: 0.03 10*3/uL (ref 0.00–0.07)
Basophils Absolute: 0 10*3/uL (ref 0.0–0.1)
Basophils Relative: 1 %
Eosinophils Absolute: 0.3 10*3/uL (ref 0.0–0.5)
Eosinophils Relative: 6 %
HCT: 32.3 % — ABNORMAL LOW (ref 39.0–52.0)
Hemoglobin: 10.3 g/dL — ABNORMAL LOW (ref 13.0–17.0)
Immature Granulocytes: 1 %
Lymphocytes Relative: 26 %
Lymphs Abs: 1.6 10*3/uL (ref 0.7–4.0)
MCH: 30.4 pg (ref 26.0–34.0)
MCHC: 31.9 g/dL (ref 30.0–36.0)
MCV: 95.3 fL (ref 80.0–100.0)
Monocytes Absolute: 0.7 10*3/uL (ref 0.1–1.0)
Monocytes Relative: 11 %
Neutro Abs: 3.3 10*3/uL (ref 1.7–7.7)
Neutrophils Relative %: 55 %
Platelets: 195 10*3/uL (ref 150–400)
RBC: 3.39 MIL/uL — ABNORMAL LOW (ref 4.22–5.81)
RDW: 16 % — ABNORMAL HIGH (ref 11.5–15.5)
WBC: 5.9 10*3/uL (ref 4.0–10.5)
nRBC: 0 % (ref 0.0–0.2)

## 2019-08-12 LAB — COMPREHENSIVE METABOLIC PANEL
ALT: 9 U/L (ref 0–44)
AST: 14 U/L — ABNORMAL LOW (ref 15–41)
Albumin: 3.3 g/dL — ABNORMAL LOW (ref 3.5–5.0)
Alkaline Phosphatase: 37 U/L — ABNORMAL LOW (ref 38–126)
Anion gap: 13 (ref 5–15)
BUN: 9 mg/dL (ref 8–23)
CO2: 26 mmol/L (ref 22–32)
Calcium: 10.2 mg/dL (ref 8.9–10.3)
Chloride: 101 mmol/L (ref 98–111)
Creatinine, Ser: 0.73 mg/dL (ref 0.61–1.24)
GFR calc Af Amer: >60 mL/min (ref >60)
GFR calc non Af Amer: >60 mL/min (ref >60)
Glucose, Bld: 104 mg/dL — ABNORMAL HIGH (ref 70–99)
Potassium: 3.3 mmol/L — ABNORMAL LOW (ref 3.5–5.1)
Sodium: 140 mmol/L (ref 135–145)
Total Bilirubin: 0.4 mg/dL (ref 0.3–1.2)
Total Protein: 5.5 g/dL — ABNORMAL LOW (ref 6.5–8.1)

## 2019-08-13 ENCOUNTER — Inpatient Hospital Stay (HOSPITAL_BASED_OUTPATIENT_CLINIC_OR_DEPARTMENT_OTHER): Payer: 59 | Admitting: Internal Medicine

## 2019-08-13 ENCOUNTER — Inpatient Hospital Stay: Payer: 59

## 2019-08-13 ENCOUNTER — Telehealth: Payer: Self-pay | Admitting: *Deleted

## 2019-08-13 ENCOUNTER — Encounter: Payer: Self-pay | Admitting: Internal Medicine

## 2019-08-13 VITALS — BP 163/80 | HR 62 | Temp 97.0°F | Resp 18

## 2019-08-13 DIAGNOSIS — Z79899 Other long term (current) drug therapy: Secondary | ICD-10-CM | POA: Diagnosis not present

## 2019-08-13 DIAGNOSIS — C61 Malignant neoplasm of prostate: Secondary | ICD-10-CM

## 2019-08-13 DIAGNOSIS — R410 Disorientation, unspecified: Secondary | ICD-10-CM | POA: Diagnosis not present

## 2019-08-13 DIAGNOSIS — Z7189 Other specified counseling: Secondary | ICD-10-CM

## 2019-08-13 DIAGNOSIS — M7989 Other specified soft tissue disorders: Secondary | ICD-10-CM | POA: Diagnosis not present

## 2019-08-13 DIAGNOSIS — Z5111 Encounter for antineoplastic chemotherapy: Secondary | ICD-10-CM | POA: Diagnosis not present

## 2019-08-13 DIAGNOSIS — C7951 Secondary malignant neoplasm of bone: Secondary | ICD-10-CM

## 2019-08-13 DIAGNOSIS — G893 Neoplasm related pain (acute) (chronic): Secondary | ICD-10-CM | POA: Diagnosis not present

## 2019-08-13 DIAGNOSIS — I25118 Atherosclerotic heart disease of native coronary artery with other forms of angina pectoris: Secondary | ICD-10-CM

## 2019-08-13 LAB — PSA: Prostatic Specific Antigen: 330 ng/mL — ABNORMAL HIGH (ref 0.00–4.00)

## 2019-08-13 MED ORDER — SODIUM CHLORIDE 0.9 % IV SOLN
Freq: Once | INTRAVENOUS | Status: AC
  Filled 2019-08-13: qty 250

## 2019-08-13 MED ORDER — SODIUM CHLORIDE 0.9 % IV SOLN
10.0000 mg | Freq: Once | INTRAVENOUS | Status: AC
  Administered 2019-08-13: 10 mg via INTRAVENOUS
  Filled 2019-08-13: qty 10

## 2019-08-13 MED ORDER — DIPHENHYDRAMINE HCL 50 MG/ML IJ SOLN
25.0000 mg | Freq: Once | INTRAMUSCULAR | Status: AC
  Administered 2019-08-13: 25 mg via INTRAVENOUS
  Filled 2019-08-13: qty 1

## 2019-08-13 MED ORDER — SODIUM CHLORIDE 0.9 % IV SOLN
20.0000 mg/m2 | Freq: Once | INTRAVENOUS | Status: AC
  Administered 2019-08-13: 38 mg via INTRAVENOUS
  Filled 2019-08-13: qty 3.8

## 2019-08-13 MED ORDER — FAMOTIDINE IN NACL 20-0.9 MG/50ML-% IV SOLN
20.0000 mg | Freq: Once | INTRAVENOUS | Status: AC
  Administered 2019-08-13: 20 mg via INTRAVENOUS
  Filled 2019-08-13: qty 50

## 2019-08-13 MED ORDER — OXYCODONE HCL 15 MG PO TABS: 15.0000 mg | ORAL_TABLET | Freq: Four times a day (QID) | ORAL | 0 refills | Status: DC | PRN

## 2019-08-13 NOTE — Telephone Encounter (Signed)
Added firmagon to the schedule

## 2019-08-13 NOTE — Progress Notes (Signed)
[place] OFFICE PROGRESS NOTE  Patient Care Team: Olin Hauser, [name], MD as PCP - Cardiology (Cardiology) Minna Merritts, [name], [name]  Cancer Staging No matching staging information was found for the patient.   Oncology History Overview Note  # 2011- PROSTATE CANCER [Gleason 3+4]; s/p Prostatectomy [ also involved bladder neck/ECP; Dr.Polaseck]; July 2014- Biochemical recurrence [PSA 14]- started on Zoladex [Dr.Pandit]; lost to follow up.  # JAN 2017- STAGE IV METASTATIC PROSTATE Cancer to Bone- Feb 13th, 2017-  Lupron q 82m[~end of feb]; PSA: 1021; Declined Chemo; April 2017 [xofigo x6; Dr.Crystal]; AUG 2017- Zytiga + Prednisone BID. Bone scan-Jan 2018- improved skeletal metastases.   # MAY 1st 2019- START X-tandi [stopped zytiga/PSA- 8.8/rising]  #August 23, 2018-stop Xtandi [intolerance-fatigue mental fogginess]  #Mid September 2020-apalutamide; stopped mid-October poor tolerance/progression  # 11/01/2018- -Taxotere weekly [consent]; July 2021-progression of disease/PSA   # 08/13/2019-cabazitaxel; Neulasta  # Mets to bone- start X-geva q 10M [May 30th]; hold dental issues/infections  # Smoker/ chronic pain/pain clinic   #[April 2018; [place]]- s/p stenting; on Plavix; --------------------------------------------------------------  DIAGNOSIS: _0  Castrate resistant prostate cancer  STAGE:   4    ;GOALS: Palliative  CURRENT/MOST RECENT THERAPY-cabazitaxel    [place]  12/08/2014 Initial Diagnosis   [place] ([place]   11/01/2018 - 07/09/2019 Chemotherapy   The patient had DOCEtaxel (TAXOTERE) 70 mg in sodium chloride 0.9 % 150 mL chemo infusion, 36 mg/m2 = 70 mg, Intravenous,  Once, 19 of 21 cycles Administration: 70 mg (11/08/2018),  70 mg (11/22/2018), 70 mg (12/13/2018), 70 mg (12/06/2018), 70 mg (12/31/2018), 70 mg (01/07/2019), 70 mg (01/29/2019), 70 mg (12/20/2018), 70 mg (02/06/2019), 70 mg (02/14/2019), 70 mg (02/28/2019), 70 mg (03/14/2019), 70 mg (03/28/2019), 70 mg (04/15/2019), 70 mg (04/29/2019), 70 mg (05/27/2019), 70 mg (06/24/2019), 70 mg (07/09/2019)  for chemotherapy treatment.    08/13/2019 -  Chemotherapy   The patient had pegfilgrastim (NEULASTA) injection 6 mg, 6 mg, Subcutaneous, Once, 1 of 4 cycles cabazitaxel (JEVTANA) 38 mg in sodium chloride 0.9 % 250 mL chemo infusion, 20 mg/m2 = 38 mg, Intravenous,  Once, 1 of 4 cycles  for chemotherapy treatment.     INTERVAL HISTORY:  Robert [id number].  male pleasant patient above history of metastatic castrate resistant prostate cancer noted to have progressed on Taxotere is here for follow-up.  Patient is here to proceed with cabazitaxel.  In the interim patient had ultrasound of his legs negative for DVT.  Patient is currently wearing compression stockings notes to improvement of his leg swelling.  Not complete as well.  Noticed to have worsening pain in his joints/back.  He continues with narcotic pain medication fentanyl and oxycodone.  Requesting a prescription/refill.   Review of Systems  Constitutional: Positive for malaise/fatigue and weight loss. Negative for chills, diaphoresis and fever.  HENT: Negative for nosebleeds and sore throat.   Eyes: Negative for double vision.  Respiratory: Positive for shortness of breath. Negative for cough, hemoptysis, sputum production and wheezing.   Cardiovascular: Positive for leg swelling. Negative for chest pain, palpitations and orthopnea.  Gastrointestinal: Positive for nausea. Negative for abdominal pain, blood in stool, diarrhea, heartburn, melena and vomiting.  Genitourinary: Negative for dysuria, frequency and urgency.  Musculoskeletal: Positive for back pain and joint pain.  Skin: Negative for  itching.   Neurological: Negative for dizziness, tingling, focal weakness, weakness and headaches.  Endo/Heme/Allergies: Does not bruise/bleed easily.  Psychiatric/Behavioral: Negative for depression. The patient is not nervous/anxious and does not have insomnia.       PAST MEDICAL HISTORY :  Past Medical History:  Diagnosis Date  . Back pain 10/09/2012  . Bone cancer ([place])   . CAD (coronary artery disease)    a. 08/2015 PCI: LM nl, LAD 20d, LCX nl, RCA 28m RPAV 95 (3.0x18 Xience Alpine DES);  b. 04/2016 PCI ([facility]: RPL 95 (2.75x8 Promus Premier DES).  . Cancer associated pain   . Depression   . History of echocardiogram    a. 08/2016 Echo: EF 50-55%.  .Marland KitchenHistory of kidney stones   . Hyperlipidemia   . Hypertension   . Joint pain   . Prostate cancer ([facility]    a.  s/p prostatectomy ([facility]);  b. bone mets noted 04/2016.  . Right arm pain 01/10/2016  . Right foot pain 01/10/2016  . Right leg pain 01/10/2016  . Tobacco abuse     PAST SURGICAL HISTORY :   Past Surgical History:  Procedure Laterality Date  . CARDIAC CATHETERIZATION     [facility]  . CARDIAC CATHETERIZATION N/A 08/16/2015   Procedure: Left Heart Cath and Coronary Angiography;  Surgeon: DYolonda Kida MD;  [facility];  Service: Cardiovascular;  Laterality: N/A;  . CARDIAC CATHETERIZATION N/A 08/16/2015   Procedure: Coronary Stent Intervention;  Surgeon: DYolonda Kida MD;  [facility];  Service: Cardiovascular;  Laterality: N/A;  . CERVIX SURGERY    . PROSTATECTOMY    . SPINE SURGERY    . TONSILLECTOMY    . WRIST SURGERY      FAMILY HISTORY :   Family History  Problem Relation Age of Onset  . Heart attack Mother   . Hypertension Mother   . Heart attack Father     SOCIAL HISTORY:   Social History   Tobacco Use  . Smoking status: Former Smoker    Packs/day: 0.50    Years: 40.00    Pack years: 20.00    Types: Cigarettes  . Smokeless tobacco: Current User    Types: Chew   . Tobacco comment: 1 pack every couple days   Vaping Use  . Vaping Use: Never used  Substance Use Topics  . Alcohol use: Yes    Comment: occasionally  . Drug use: No    ALLERGIES:  is allergic to ditropan [oxybutynin].  MEDICATIONS:  Current Outpatient Medications  Medication Sig Dispense Refill  . acetaminophen (TYLENOL) 500 MG tablet Take 1,000 mg by mouth every 8 (eight) hours as needed.     .Marland Kitchenalbuterol (PROVENTIL HFA;VENTOLIN HFA) 108 (90 Base) MCG/ACT inhaler Inhale 1-2 puffs into the lungs every 6 (six) hours as needed for wheezing or shortness of breath. 1 Inhaler 0  . aspirin 81 MG tablet Take 81 mg by mouth daily.    .Marland Kitchenatorvastatin (LIPITOR) 80 MG tablet Take 1 tablet (80 mg total) by mouth daily at 6 PM. 90 tablet 3  . clopidogrel (PLAVIX) 75 MG tablet Take 1 tablet (75 mg total) by mouth daily. 90 tablet 3  . fentaNYL (DURAGESIC) 100 MCG/HR Place 1 patch onto the skin every other day. Along with 28m patch for total of 125 mcg every 2 days. 15 patch 0  . fentaNYL (DURAGESIC) 25 MCG/HR Place 1 patch onto the skin every other day. Use this along  with fentanyl patch 100 mcg [total 125 mcg] every 2 days 15 patch 0  . gabapentin (NEURONTIN) 100 MG capsule Take 1 capsule (100 mg total) by mouth 3 (three) times daily. 90 capsule 5  . metoprolol tartrate (LOPRESSOR) 25 MG tablet TAKE 1/2 TABLET BY MOUTH 2 TIMES DAILY. 90 tablet 1  . naloxone (NARCAN) nasal spray 4 mg/0.1 mL 1 spray into nostril upon signs of opioid overdose. Call 911. May repeat once if no response within 2-3 minutes. 1 kit 0  . polyethylene glycol (MIRALAX / GLYCOLAX) packet Take 17 g by mouth daily as needed for moderate constipation.    . predniSONE (DELTASONE) 5 MG tablet Take 1 tablet (5 mg total) by mouth daily with breakfast. 30 tablet 3  . promethazine (PHENERGAN) 25 MG tablet TAKE 1/2 TO 1 TABLET (12.5-25 MG TOTAL) BY MOUTH EVERY 8 (EIGHT) HOURS AS NEEDED FOR NAUSEA OR VOMITING. 60 tablet 3  . traZODone  (DESYREL) 50 MG tablet Take 1-2 tablets (50-100 mg total) by mouth at bedtime as needed for sleep. 60 tablet 2  . nitroGLYCERIN (NITROSTAT) 0.4 MG SL tablet Place 1 tablet (0.4 mg total) under the tongue every 5 (five) minutes as needed. (Patient not taking: Reported on 08/13/2019) 25 tablet 3  . oxyCODONE (ROXICODONE) 15 MG immediate release tablet Take 1 tablet (15 mg total) by mouth every 6 (six) hours as needed. for pain 90 tablet 0   No current facility-administered medications for this visit.   Facility-Administered Medications Ordered in Other Visits  Medication Dose Route Frequency Provider Last Rate Last Admin  . cabazitaxel (JEVTANA) 38 mg in sodium chloride 0.9 % 250 mL chemo infusion  20 mg/m2 (Treatment Plan Recorded) Intravenous Once [name], MD        PHYSICAL EXAMINATION: ECOG PERFORMANCE STATUS: 1 - Symptomatic but completely ambulatory  BP (!) 156/88 (BP Location: Right Arm, Patient Position: Sitting)   Pulse 94   Temp 98.1 F (36.7 C) (Tympanic)   Resp 18   Wt 163 lb (73.9 kg)   SpO2 99%   BMI 22.73 kg/m   Filed Weights   08/13/19 1110  Weight: 163 lb (73.9 kg)    Physical Exam Constitutional:      Comments:  Frail-appearing Caucasian male patient.  He is in a walking with a cane.  He is accompanied by his wife.  HENT:     Head: Normocephalic and atraumatic.     Mouth/Throat:     Pharynx: No oropharyngeal exudate.  Eyes:     Pupils: Pupils are equal, round, and reactive to light.  Cardiovascular:     Rate and Rhythm: Normal rate and regular rhythm.  Pulmonary:     Effort: No respiratory distress.     Breath sounds: No wheezing.  Abdominal:     General: Bowel sounds are normal. There is no distension.     Palpations: Abdomen is soft. There is no mass.     Tenderness: There is no abdominal tenderness. There is no guarding or rebound.  Musculoskeletal:        General: No tenderness. Normal range of motion.     Cervical back: Normal range  of motion and neck supple.     Comments: Bilateral lower extremity swelling tenderness on deep palpation.  Skin:    General: Skin is warm.     Comments: Macular rash on hands  Neurological:     Mental Status: He is alert and oriented to person, place, and time.  Psychiatric:  Mood and Affect: Affect normal.     LABORATORY DATA:  I have reviewed the data as listed    Component Value Date/Time   NA 140 08/12/2019 1137   NA 140 07/13/2013 1542   K 3.3 (L) 08/12/2019 1137   K 4.3 07/13/2013 1542   CL 101 08/12/2019 1137   CL 107 07/13/2013 1542   CO2 26 08/12/2019 1137   CO2 26 07/13/2013 1542   GLUCOSE 104 (H) 08/12/2019 1137   GLUCOSE 106 (H) 07/13/2013 1542   BUN 9 08/12/2019 1137   BUN 11 07/13/2013 1542   CREATININE 0.73 08/12/2019 1137   CREATININE 0.76 10/15/2017 1001   CALCIUM 10.2 08/12/2019 1137   CALCIUM 9.9 07/13/2013 1542   PROT 5.5 (L) 08/12/2019 1137   PROT 8.1 07/13/2013 1542   ALBUMIN 3.3 (L) 08/12/2019 1137   ALBUMIN 4.3 07/13/2013 1542   AST 14 (L) 08/12/2019 1137   AST 26 07/13/2013 1542   ALT 9 08/12/2019 1137   ALT 32 07/13/2013 1542   ALKPHOS 37 (L) 08/12/2019 1137   ALKPHOS 74 07/13/2013 1542   BILITOT 0.4 08/12/2019 1137   BILITOT 0.4 07/13/2013 1542   GFRNONAA >60 08/12/2019 1137   GFRNONAA 95 10/15/2017 1001   GFRAA >60 08/12/2019 1137   GFRAA 110 10/15/2017 1001    No results found for: SPEP, UPEP  Lab Results  Component Value Date   WBC 5.9 08/12/2019   NEUTROABS 3.3 08/12/2019   HGB 10.3 (L) 08/12/2019   HCT 32.3 (L) 08/12/2019   MCV 95.3 08/12/2019   PLT 195 08/12/2019      Chemistry      Component Value Date/Time   NA 140 08/12/2019 1137   NA 140 07/13/2013 1542   K 3.3 (L) 08/12/2019 1137   K 4.3 07/13/2013 1542   CL 101 08/12/2019 1137   CL 107 07/13/2013 1542   CO2 26 08/12/2019 1137   CO2 26 07/13/2013 1542   BUN 9 08/12/2019 1137   BUN 11 07/13/2013 1542   CREATININE 0.73 08/12/2019 1137   CREATININE  0.76 10/15/2017 1001      Component Value Date/Time   CALCIUM 10.2 08/12/2019 1137   CALCIUM 9.9 07/13/2013 1542   ALKPHOS 37 (L) 08/12/2019 1137   ALKPHOS 74 07/13/2013 1542   AST 14 (L) 08/12/2019 1137   AST 26 07/13/2013 1542   ALT 9 08/12/2019 1137   ALT 32 07/13/2013 1542   BILITOT 0.4 08/12/2019 1137   BILITOT 0.4 07/13/2013 1542     RADIOGRAPHIC STUDIES: I have personally reviewed the radiological images as listed and agreed with the findings in the report. No results found.   ASSESSMENT & PLAN:  Prostate cancer metastatic to bone ([place]) # Castrate resistant prostate cancer metastases to bone. ADT; Taxotere q 2 weeks; MAY 2021- PET- Improved; however PSA- rising 70 to 330. Given the progressive PSA/symptoms recommend discontinuation of Taxotere.    # Proceed with cabazitaxel; Labs today reviewed;  acceptable for treatment today.  Again reminded the potential side effects including but not limited to diarrhea neuropathy low blood counts.  Recommend having Imodium at home.  Also Neulasta day 2.  Understands treatments are palliative.  #Bilateral swelling in the legs-NEG for DVt- ? Sec to taxoeter- compression stockings.   # "confused"-post? Eligard [on May 28th]; currently resolved.  Plan to start [place] start in August mid.   # Malignancy related pain- continue fentanyl patch hydrocodone;. Continue fentanyl patch to every 48 hours and continue oxycodone  prn; STABLE.   # DISPOSITION:  # Proceed with chemo today; d-2 neulasta # weekly cbc/bmp [in [place]]x 2 # in 3 weeks- MD;  Reva Bores- cbc/cmp/PSA 1 day prior in [place];] Cabazitaxel D-2 neulasta; Firmagon-Dr.B     Orders Placed This Encounter  Procedures  . CBC with Differential    Standing Status:   Future    Standing Expiration Date:   08/12/2020  . Basic metabolic panel    Standing Status:   Future    Standing Expiration Date:   08/12/2020  . CBC with Differential    Standing Status:   Future    Standing Expiration  Date:   08/12/2020  . Basic metabolic panel    Standing Status:   Future    Standing Expiration Date:   08/12/2020  . CBC with Differential    Standing Status:   Future    Standing Expiration Date:   08/12/2020  . Comprehensive metabolic panel    Standing Status:   Future    Standing Expiration Date:   08/12/2020  . PSA    Standing Status:   Future    Standing Expiration Date:   08/12/2020   All questions were answered. The patient knows to call the clinic with any problems, questions or concerns.      [name], MD 08/13/2019 12:43 PM

## 2019-08-13 NOTE — Assessment & Plan Note (Addendum)
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 ADT; Taxotere q 2 weeks; MAY 2021- PET- Improved; however PSA- rising 70 to 330. Given the progressive PSA/symptoms recommend discontinuation of Taxotere.    # Proceed with cabazitaxel; Labs today reviewed;  acceptable for treatment today.  Again reminded the potential side effects including but not limited to diarrhea neuropathy low blood counts.  Recommend having Imodium at home.  Also Neulasta day 2.  Understands treatments are palliative.  #Bilateral swelling in the legs-NEG for DVt- ? Sec to taxoeter- compression stockings.   # "confused"-post? Eligard [on May 28th]; currently resolved.  Plan to start [place] start in August mid.   # Malignancy related pain- continue fentanyl patch hydrocodone;. Continue fentanyl patch to every 48 hours and continue oxycodone prn; STABLE.   # DISPOSITION:  # Proceed with chemo today; d-2 neulasta # weekly cbc/bmp [in [place]]x 2 # in 3 weeks- MD;  Reva Bores- cbc/cmp/PSA 1 day prior in [place];] Cabazitaxel D-2 neulasta; Firmagon-Dr.B

## 2019-08-13 NOTE — Telephone Encounter (Signed)
Check out note -per msg from Dr. Rogue Bussing - please cancel Eligard; and instead add firmagon at next visit-

## 2019-08-13 NOTE — Progress Notes (Signed)
Pt tolerated infusion well, no s/s of distress or reaction noted. Pt educated to call clinic with concerns or report to ER/call 911 in the event of an emergency, pt verbalizes understanding. Pt and VS stable at discharge.

## 2019-08-14 ENCOUNTER — Inpatient Hospital Stay: Payer: 59

## 2019-08-14 ENCOUNTER — Other Ambulatory Visit: Payer: Self-pay

## 2019-08-14 DIAGNOSIS — R410 Disorientation, unspecified: Secondary | ICD-10-CM | POA: Diagnosis not present

## 2019-08-14 DIAGNOSIS — Z5111 Encounter for antineoplastic chemotherapy: Secondary | ICD-10-CM | POA: Diagnosis not present

## 2019-08-14 DIAGNOSIS — G893 Neoplasm related pain (acute) (chronic): Secondary | ICD-10-CM | POA: Diagnosis not present

## 2019-08-14 DIAGNOSIS — C7951 Secondary malignant neoplasm of bone: Secondary | ICD-10-CM | POA: Diagnosis not present

## 2019-08-14 DIAGNOSIS — Z7189 Other specified counseling: Secondary | ICD-10-CM

## 2019-08-14 DIAGNOSIS — C61 Malignant neoplasm of prostate: Secondary | ICD-10-CM

## 2019-08-14 DIAGNOSIS — Z79899 Other long term (current) drug therapy: Secondary | ICD-10-CM | POA: Diagnosis not present

## 2019-08-14 DIAGNOSIS — M7989 Other specified soft tissue disorders: Secondary | ICD-10-CM | POA: Diagnosis not present

## 2019-08-14 MED ORDER — PEGFILGRASTIM INJECTION 6 MG/0.6ML ~~LOC~~
6.0000 mg | PREFILLED_SYRINGE | Freq: Once | SUBCUTANEOUS | Status: AC
  Administered 2019-08-14: 6 mg via SUBCUTANEOUS
  Filled 2019-08-14: qty 0.6

## 2019-08-20 ENCOUNTER — Other Ambulatory Visit: Payer: Self-pay

## 2019-08-20 ENCOUNTER — Inpatient Hospital Stay: Payer: 59

## 2019-08-20 DIAGNOSIS — C7951 Secondary malignant neoplasm of bone: Secondary | ICD-10-CM

## 2019-08-20 DIAGNOSIS — C61 Malignant neoplasm of prostate: Secondary | ICD-10-CM

## 2019-08-20 DIAGNOSIS — Z79899 Other long term (current) drug therapy: Secondary | ICD-10-CM | POA: Diagnosis not present

## 2019-08-20 DIAGNOSIS — G893 Neoplasm related pain (acute) (chronic): Secondary | ICD-10-CM | POA: Diagnosis not present

## 2019-08-20 DIAGNOSIS — R410 Disorientation, unspecified: Secondary | ICD-10-CM | POA: Diagnosis not present

## 2019-08-20 DIAGNOSIS — Z5111 Encounter for antineoplastic chemotherapy: Secondary | ICD-10-CM | POA: Diagnosis not present

## 2019-08-20 DIAGNOSIS — M7989 Other specified soft tissue disorders: Secondary | ICD-10-CM | POA: Diagnosis not present

## 2019-08-20 LAB — CBC WITH DIFFERENTIAL/PLATELET
Abs Immature Granulocytes: 1.57 10*3/uL — ABNORMAL HIGH (ref 0.00–0.07)
Basophils Absolute: 0.2 10*3/uL — ABNORMAL HIGH (ref 0.0–0.1)
Basophils Relative: 1 %
Eosinophils Absolute: 0.2 10*3/uL (ref 0.0–0.5)
Eosinophils Relative: 1 %
HCT: 39.1 % (ref 39.0–52.0)
Hemoglobin: 12.7 g/dL — ABNORMAL LOW (ref 13.0–17.0)
Immature Granulocytes: 5 %
Lymphocytes Relative: 6 %
Lymphs Abs: 1.9 10*3/uL (ref 0.7–4.0)
MCH: 30.7 pg (ref 26.0–34.0)
MCHC: 32.5 g/dL (ref 30.0–36.0)
MCV: 94.4 fL (ref 80.0–100.0)
Monocytes Absolute: 2.7 10*3/uL — ABNORMAL HIGH (ref 0.1–1.0)
Monocytes Relative: 9 %
Neutro Abs: 24.7 10*3/uL — ABNORMAL HIGH (ref 1.7–7.7)
Neutrophils Relative %: 78 %
Platelets: 206 10*3/uL (ref 150–400)
RBC: 4.14 MIL/uL — ABNORMAL LOW (ref 4.22–5.81)
RDW: 16.2 % — ABNORMAL HIGH (ref 11.5–15.5)
Smear Review: ADEQUATE
WBC: 31.1 10*3/uL — ABNORMAL HIGH (ref 4.0–10.5)
nRBC: 0 % (ref 0.0–0.2)

## 2019-08-20 LAB — BASIC METABOLIC PANEL WITH GFR
Anion gap: 8 (ref 5–15)
BUN: 8 mg/dL (ref 8–23)
CO2: 26 mmol/L (ref 22–32)
Calcium: 9.3 mg/dL (ref 8.9–10.3)
Chloride: 98 mmol/L (ref 98–111)
Creatinine, Ser: 0.89 mg/dL (ref 0.61–1.24)
GFR calc Af Amer: >60 mL/min
GFR calc non Af Amer: >60 mL/min
Glucose, Bld: 125 mg/dL — ABNORMAL HIGH (ref 70–99)
Potassium: 4.4 mmol/L (ref 3.5–5.1)
Sodium: 132 mmol/L — ABNORMAL LOW (ref 135–145)

## 2019-08-27 ENCOUNTER — Inpatient Hospital Stay: Payer: 59

## 2019-08-28 ENCOUNTER — Other Ambulatory Visit: Payer: Self-pay

## 2019-08-28 ENCOUNTER — Inpatient Hospital Stay: Payer: 59

## 2019-08-28 DIAGNOSIS — C61 Malignant neoplasm of prostate: Secondary | ICD-10-CM | POA: Diagnosis not present

## 2019-08-28 DIAGNOSIS — C7951 Secondary malignant neoplasm of bone: Secondary | ICD-10-CM

## 2019-08-28 DIAGNOSIS — G893 Neoplasm related pain (acute) (chronic): Secondary | ICD-10-CM | POA: Diagnosis not present

## 2019-08-28 DIAGNOSIS — M7989 Other specified soft tissue disorders: Secondary | ICD-10-CM | POA: Diagnosis not present

## 2019-08-28 DIAGNOSIS — Z5111 Encounter for antineoplastic chemotherapy: Secondary | ICD-10-CM | POA: Diagnosis not present

## 2019-08-28 DIAGNOSIS — Z79899 Other long term (current) drug therapy: Secondary | ICD-10-CM | POA: Diagnosis not present

## 2019-08-28 DIAGNOSIS — R410 Disorientation, unspecified: Secondary | ICD-10-CM | POA: Diagnosis not present

## 2019-08-28 LAB — CBC WITH DIFFERENTIAL/PLATELET
Abs Immature Granulocytes: 0.14 10*3/uL — ABNORMAL HIGH (ref 0.00–0.07)
Basophils Absolute: 0 10*3/uL (ref 0.0–0.1)
Basophils Relative: 0 %
Eosinophils Absolute: 0.1 10*3/uL (ref 0.0–0.5)
Eosinophils Relative: 1 %
HCT: 39.4 % (ref 39.0–52.0)
Hemoglobin: 12.7 g/dL — ABNORMAL LOW (ref 13.0–17.0)
Immature Granulocytes: 1 %
Lymphocytes Relative: 10 %
Lymphs Abs: 1.2 10*3/uL (ref 0.7–4.0)
MCH: 30.2 pg (ref 26.0–34.0)
MCHC: 32.2 g/dL (ref 30.0–36.0)
MCV: 93.6 fL (ref 80.0–100.0)
Monocytes Absolute: 0.6 10*3/uL (ref 0.1–1.0)
Monocytes Relative: 5 %
Neutro Abs: 9.4 10*3/uL — ABNORMAL HIGH (ref 1.7–7.7)
Neutrophils Relative %: 83 %
Platelets: 235 10*3/uL (ref 150–400)
RBC: 4.21 MIL/uL — ABNORMAL LOW (ref 4.22–5.81)
RDW: 16.1 % — ABNORMAL HIGH (ref 11.5–15.5)
WBC: 11.5 10*3/uL — ABNORMAL HIGH (ref 4.0–10.5)
nRBC: 0 % (ref 0.0–0.2)

## 2019-08-28 LAB — BASIC METABOLIC PANEL
Anion gap: 8 (ref 5–15)
BUN: 11 mg/dL (ref 8–23)
CO2: 26 mmol/L (ref 22–32)
Calcium: 9.1 mg/dL (ref 8.9–10.3)
Chloride: 100 mmol/L (ref 98–111)
Creatinine, Ser: 0.81 mg/dL (ref 0.61–1.24)
GFR calc Af Amer: >60 mL/min (ref >60)
GFR calc non Af Amer: >60 mL/min (ref >60)
Glucose, Bld: 147 mg/dL — ABNORMAL HIGH (ref 70–99)
Potassium: 3.7 mmol/L (ref 3.5–5.1)
Sodium: 134 mmol/L — ABNORMAL LOW (ref 135–145)

## 2019-09-01 ENCOUNTER — Telehealth: Payer: Self-pay | Admitting: *Deleted

## 2019-09-01 ENCOUNTER — Other Ambulatory Visit: Payer: Self-pay | Admitting: Internal Medicine

## 2019-09-01 DIAGNOSIS — G893 Neoplasm related pain (acute) (chronic): Secondary | ICD-10-CM

## 2019-09-01 DIAGNOSIS — C7951 Secondary malignant neoplasm of bone: Secondary | ICD-10-CM

## 2019-09-01 MED ORDER — FENTANYL 25 MCG/HR TD PT72: 1.0000 | MEDICATED_PATCH | TRANSDERMAL | 0 refills | Status: DC

## 2019-09-01 MED ORDER — FENTANYL 100 MCG/HR TD PT72: 1.0000 | MEDICATED_PATCH | TRANSDERMAL | 0 refills | Status: DC

## 2019-09-01 MED ORDER — OXYCODONE HCL 15 MG PO TABS
15.0000 mg | ORAL_TABLET | Freq: Four times a day (QID) | ORAL | 0 refills | Status: DC | PRN
Start: 2019-09-01 — End: 2019-09-18

## 2019-09-01 NOTE — Telephone Encounter (Signed)
Verlon Hamric Key: [id number] - Rx #: [id number] Fentayl patches 100 mcg dosing - PA submitted for patient. Pending insurance approval

## 2019-09-02 ENCOUNTER — Other Ambulatory Visit: Payer: Self-pay

## 2019-09-02 ENCOUNTER — Telehealth: Payer: Self-pay | Admitting: *Deleted

## 2019-09-02 ENCOUNTER — Inpatient Hospital Stay: Payer: 59

## 2019-09-02 DIAGNOSIS — Z5111 Encounter for antineoplastic chemotherapy: Secondary | ICD-10-CM | POA: Diagnosis not present

## 2019-09-02 DIAGNOSIS — G893 Neoplasm related pain (acute) (chronic): Secondary | ICD-10-CM | POA: Diagnosis not present

## 2019-09-02 DIAGNOSIS — C61 Malignant neoplasm of prostate: Secondary | ICD-10-CM | POA: Diagnosis not present

## 2019-09-02 DIAGNOSIS — R410 Disorientation, unspecified: Secondary | ICD-10-CM | POA: Diagnosis not present

## 2019-09-02 DIAGNOSIS — M7989 Other specified soft tissue disorders: Secondary | ICD-10-CM | POA: Diagnosis not present

## 2019-09-02 DIAGNOSIS — C7951 Secondary malignant neoplasm of bone: Secondary | ICD-10-CM

## 2019-09-02 DIAGNOSIS — Z79899 Other long term (current) drug therapy: Secondary | ICD-10-CM | POA: Diagnosis not present

## 2019-09-02 LAB — COMPREHENSIVE METABOLIC PANEL
ALT: 9 U/L (ref 0–44)
AST: 13 U/L — ABNORMAL LOW (ref 15–41)
Albumin: 3.6 g/dL (ref 3.5–5.0)
Alkaline Phosphatase: 55 U/L (ref 38–126)
Anion gap: 8 (ref 5–15)
BUN: 9 mg/dL (ref 8–23)
CO2: 25 mmol/L (ref 22–32)
Calcium: 9.2 mg/dL (ref 8.9–10.3)
Chloride: 100 mmol/L (ref 98–111)
Creatinine, Ser: 0.84 mg/dL (ref 0.61–1.24)
GFR calc Af Amer: >60 mL/min (ref >60)
GFR calc non Af Amer: >60 mL/min (ref >60)
Glucose, Bld: 143 mg/dL — ABNORMAL HIGH (ref 70–99)
Potassium: 3.5 mmol/L (ref 3.5–5.1)
Sodium: 133 mmol/L — ABNORMAL LOW (ref 135–145)
Total Bilirubin: 0.4 mg/dL (ref 0.3–1.2)
Total Protein: 6 g/dL — ABNORMAL LOW (ref 6.5–8.1)

## 2019-09-02 LAB — CBC WITH DIFFERENTIAL/PLATELET
Abs Immature Granulocytes: 0.05 10*3/uL (ref 0.00–0.07)
Basophils Absolute: 0.1 10*3/uL (ref 0.0–0.1)
Basophils Relative: 1 %
Eosinophils Absolute: 0.1 10*3/uL (ref 0.0–0.5)
Eosinophils Relative: 2 %
HCT: 39 % (ref 39.0–52.0)
Hemoglobin: 12.8 g/dL — ABNORMAL LOW (ref 13.0–17.0)
Immature Granulocytes: 1 %
Lymphocytes Relative: 20 %
Lymphs Abs: 1.8 10*3/uL (ref 0.7–4.0)
MCH: 30.5 pg (ref 26.0–34.0)
MCHC: 32.8 g/dL (ref 30.0–36.0)
MCV: 93.1 fL (ref 80.0–100.0)
Monocytes Absolute: 0.9 10*3/uL (ref 0.1–1.0)
Monocytes Relative: 10 %
Neutro Abs: 6.2 10*3/uL (ref 1.7–7.7)
Neutrophils Relative %: 66 %
Platelets: 257 10*3/uL (ref 150–400)
RBC: 4.19 MIL/uL — ABNORMAL LOW (ref 4.22–5.81)
RDW: 15.9 % — ABNORMAL HIGH (ref 11.5–15.5)
WBC: 9.2 10*3/uL (ref 4.0–10.5)
nRBC: 0 % (ref 0.0–0.2)

## 2019-09-02 LAB — PSA: Prostatic Specific Antigen: 520 ng/mL — ABNORMAL HIGH (ref 0.00–4.00)

## 2019-09-02 NOTE — Telephone Encounter (Signed)
-----   Message from Secundino Ginger sent at 09/02/2019 11:31 AM EDT ----- Regarding: MEDICATION 100 MG [place]. HE IS HERE IN [place] GETTING LABS AND HIS WIFE TOLD ME TO SENT YOU A MESSAGE.

## 2019-09-02 NOTE — Telephone Encounter (Signed)
Duplicate phone note opened in error

## 2019-09-02 NOTE — Telephone Encounter (Signed)
Spoke with patient. Explained that PA is still in review process. PA was submitted yesterday. Pt expressed concerned that he is 'almost' out of fentanyl patches. I reassured pt that I would keep him updated on the PA process.

## 2019-09-03 ENCOUNTER — Encounter: Payer: Self-pay | Admitting: Internal Medicine

## 2019-09-03 ENCOUNTER — Inpatient Hospital Stay: Payer: 59

## 2019-09-03 ENCOUNTER — Other Ambulatory Visit: Payer: 59

## 2019-09-03 ENCOUNTER — Inpatient Hospital Stay: Payer: 59 | Attending: Internal Medicine | Admitting: Internal Medicine

## 2019-09-03 VITALS — BP 137/92 | HR 100 | Temp 97.5°F | Resp 16 | Ht 71.0 in | Wt 151.0 lb

## 2019-09-03 DIAGNOSIS — R5383 Other fatigue: Secondary | ICD-10-CM | POA: Insufficient documentation

## 2019-09-03 DIAGNOSIS — G629 Polyneuropathy, unspecified: Secondary | ICD-10-CM | POA: Diagnosis not present

## 2019-09-03 DIAGNOSIS — C61 Malignant neoplasm of prostate: Secondary | ICD-10-CM | POA: Diagnosis not present

## 2019-09-03 DIAGNOSIS — I25118 Atherosclerotic heart disease of native coronary artery with other forms of angina pectoris: Secondary | ICD-10-CM

## 2019-09-03 DIAGNOSIS — C7951 Secondary malignant neoplasm of bone: Secondary | ICD-10-CM | POA: Insufficient documentation

## 2019-09-03 DIAGNOSIS — T451X5A Adverse effect of antineoplastic and immunosuppressive drugs, initial encounter: Secondary | ICD-10-CM | POA: Insufficient documentation

## 2019-09-03 DIAGNOSIS — Z79899 Other long term (current) drug therapy: Secondary | ICD-10-CM | POA: Insufficient documentation

## 2019-09-03 MED ORDER — DEXAMETHASONE SODIUM PHOSPHATE 10 MG/ML IJ SOLN: 10.0000 mg | Freq: Once | INTRAMUSCULAR | Status: DC

## 2019-09-03 MED ORDER — DEXAMETHASONE SODIUM PHOSPHATE 10 MG/ML IJ SOLN
10.0000 mg | Freq: Once | INTRAMUSCULAR | Status: AC
  Administered 2019-09-03: 10 mg via INTRAVENOUS
  Filled 2019-09-03: qty 1

## 2019-09-03 MED ORDER — SODIUM CHLORIDE 0.9 % IV SOLN
Freq: Once | INTRAVENOUS | Status: AC
  Filled 2019-09-03: qty 250

## 2019-09-03 NOTE — Progress Notes (Signed)
[facility] OFFICE PROGRESS NOTE  Patient Care Team: Olin Hauser, [name], MD as PCP - Cardiology (Cardiology) Minna Merritts, [name], [name]  Cancer Staging No matching staging information was found for the patient.   Oncology History Overview Note  # 2011- PROSTATE CANCER [Gleason 3+4]; s/p Prostatectomy [ also involved bladder neck/ECP; Dr.Polaseck]; July 2014- Biochemical recurrence [PSA 14]- started on Zoladex [Dr.Pandit]; lost to follow up.  # JAN 2017- STAGE IV METASTATIC PROSTATE Cancer to Bone- Feb 13th, 2017-  Lupron q 51m[~end of feb]; PSA: 1021; Declined Chemo; April 2017 [xofigo x6; Dr.Crystal]; AUG 2017- Zytiga + Prednisone BID. Bone scan-Jan 2018- improved skeletal metastases.   # MAY 1st 2019- START X-tandi [stopped zytiga/PSA- 8.8/rising]  #August 23, 2018-stop Xtandi [intolerance-fatigue mental fogginess]  #Mid September 2020-apalutamide; stopped mid-October poor tolerance/progression  # 11/01/2018- -Taxotere weekly [consent]; July 2021-progression of disease/PSA   # 08/13/2019-cabazitaxel; Neulasta  # Mets to bone- start X-geva q 28M [May 30th]; hold dental issues/infections  # Smoker/ chronic pain/pain clinic   #[April 2018; [place]]- s/p stenting; on Plavix; --------------------------------------------------------------  DIAGNOSIS: '[ ]'  Castrate resistant prostate cancer  STAGE:   4    ;GOALS: Palliative  CURRENT/MOST RECENT THERAPY-cabazitaxel    [place] [address]  12/08/2014 Initial Diagnosis   [place] cancer [place] bone ([facility]   11/01/2018 - 07/09/2019 Chemotherapy   The patient had DOCEtaxel (TAXOTERE) 70 mg in sodium chloride 0.9 % 150 mL chemo infusion, 36 mg/m2 = 70 mg, Intravenous,  Once, 19 of 21 cycles Administration: 70 mg (11/08/2018),  70 mg (11/22/2018), 70 mg (12/13/2018), 70 mg (12/06/2018), 70 mg (12/31/2018), 70 mg (01/07/2019), 70 mg (01/29/2019), 70 mg (12/20/2018), 70 mg (02/06/2019), 70 mg (02/14/2019), 70 mg (02/28/2019), 70 mg (03/14/2019), 70 mg (03/28/2019), 70 mg (04/15/2019), 70 mg (04/29/2019), 70 mg (05/27/2019), 70 mg (06/24/2019), 70 mg (07/09/2019)  for chemotherapy treatment.    08/13/2019 -  Chemotherapy   The patient had pegfilgrastim (NEULASTA) injection 6 mg, 6 mg, Subcutaneous, Once, 2 of 4 cycles Administration: 6 mg (08/14/2019) cabazitaxel (JEVTANA) 38 mg in sodium chloride 0.9 % 250 mL chemo infusion, 20 mg/m2 = 38 mg, Intravenous,  Once, 2 of 4 cycles Administration: 38 mg (08/13/2019)  for chemotherapy treatment.     INTERVAL HISTORY:  SDALEY GOSSE89y.o.  male pleasant patient above history of metastatic castrate resistant prostate cancer currently on cabazitaxel is here for follow-up.  Patient had chemotherapy cycle #1 approximately 3 weeks ago.  Patient complains of extreme fatigue. Complains of no energy/even with his daily chores. Difficulty walking because of generalized weakness. Patient notes to have significant headaches joint pain secondary to Neulasta. Currently improved.  Continues to have pain in his extremities. Continues have tingling and numbness. Feels overall poorly. Continues fentanyl patch and oxycodone as needed.   Review of Systems  Constitutional: Positive for malaise/fatigue and weight loss. Negative for chills, diaphoresis and fever.  HENT: Negative for nosebleeds and sore throat.   Eyes: Negative for double vision.  Respiratory: Positive for shortness of breath. Negative for cough, hemoptysis, sputum production and wheezing.   Cardiovascular: Positive for leg swelling. Negative for chest pain, palpitations and orthopnea.  Gastrointestinal: Positive for nausea. Negative for abdominal pain, blood in stool, diarrhea, heartburn, melena and vomiting.  Genitourinary: Negative for  dysuria, frequency and urgency.  Musculoskeletal: Positive for  back pain and joint pain.  Skin: Negative for itching.  Neurological: Negative for dizziness, tingling, focal weakness, weakness and headaches.  Endo/Heme/Allergies: Does not bruise/bleed easily.  Psychiatric/Behavioral: Negative for depression. The patient is not nervous/anxious and does not have insomnia.       PAST MEDICAL HISTORY :  Past Medical History:  Diagnosis Date  . Back pain 10/09/2012  . Bone cancer ([place])   . CAD (coronary artery disease)    a. 08/2015 PCI: LM nl, LAD 20d, LCX nl, RCA 38m RPAV 95 (3.0x18 Xience Alpine DES);  b. 04/2016 PCI ([name]: RPL 95 (2.75x8 Promus Premier DES).  . Cancer associated pain   . Depression   . History of echocardiogram    a. 08/2016 Echo: EF 50-55%.  .Marland KitchenHistory of kidney stones   . Hyperlipidemia   . Hypertension   . Joint pain   . Prostate cancer ([facility]    a.  s/p prostatectomy ([facility]);  b. bone mets noted 04/2016.  . Right arm pain 01/10/2016  . Right foot pain 01/10/2016  . Right leg pain 01/10/2016  . Tobacco abuse     PAST SURGICAL HISTORY :   Past Surgical History:  Procedure Laterality Date  . CARDIAC CATHETERIZATION     [facility]  . CARDIAC CATHETERIZATION N/A 08/16/2015   Procedure: Left Heart Cath and Coronary Angiography;  Surgeon: DYolonda Kida MD;  [facility];  Service: Cardiovascular;  Laterality: N/A;  . CARDIAC CATHETERIZATION N/A 08/16/2015   Procedure: Coronary Stent Intervention;  Surgeon: DYolonda Kida MD;  [facility];  Service: Cardiovascular;  Laterality: N/A;  . CERVIX SURGERY    . PROSTATECTOMY    . SPINE SURGERY    . TONSILLECTOMY    . WRIST SURGERY      FAMILY HISTORY :   Family History  Problem Relation Age of Onset  . Heart attack Mother   . Hypertension Mother   . Heart attack Father     SOCIAL HISTORY:   Social History   Tobacco Use  . Smoking status: Former Smoker    Packs/day:  0.50    Years: 40.00    Pack years: 20.00    Types: Cigarettes  . Smokeless tobacco: Current User    Types: Chew  . Tobacco comment: 1 pack every couple days   Vaping Use  . Vaping Use: Never used  Substance Use Topics  . Alcohol use: Yes    Comment: occasionally  . Drug use: No    ALLERGIES:  is allergic to ditropan [oxybutynin].  MEDICATIONS:  Current Outpatient Medications  Medication Sig Dispense Refill  . acetaminophen (TYLENOL) 500 MG tablet Take 1,000 mg by mouth every 8 (eight) hours as needed.     .Marland Kitchenalbuterol (PROVENTIL HFA;VENTOLIN HFA) 108 (90 Base) MCG/ACT inhaler Inhale 1-2 puffs into the lungs every 6 (six) hours as needed for wheezing or shortness of breath. 1 Inhaler 0  . aspirin 81 MG tablet Take 81 mg by mouth daily.    .Marland Kitchenatorvastatin (LIPITOR) 80 MG tablet Take 1 tablet (80 mg total) by mouth daily at 6 PM. 90 tablet 3  . clopidogrel (PLAVIX) 75 MG tablet Take 1 tablet (75 mg total) by mouth daily. 90 tablet 3  . fentaNYL (DURAGESIC) 100 MCG/HR Place 1 patch onto the skin every other day. Along with 253m patch for total of 125 mcg every 2 days. 15 patch 0  . fentaNYL (DURAGESIC) 25 MCG/HR Place 1 patch onto  the skin every other day. Use this along with fentanyl patch 100 mcg [total 125 mcg] every 2 days 15 patch 0  . gabapentin (NEURONTIN) 100 MG capsule Take 1 capsule (100 mg total) by mouth 3 (three) times daily. 90 capsule 5  . metoprolol tartrate (LOPRESSOR) 25 MG tablet TAKE 1/2 TABLET BY MOUTH 2 TIMES DAILY. 90 tablet 1  . naloxone (NARCAN) nasal spray 4 mg/0.1 mL 1 spray into nostril upon signs of opioid overdose. Call 911. May repeat once if no response within 2-3 minutes. 1 kit 0  . oxyCODONE (ROXICODONE) 15 MG immediate release tablet Take 1 tablet (15 mg total) by mouth every 6 (six) hours as needed. for pain 90 tablet 0  . polyethylene glycol (MIRALAX / GLYCOLAX) packet Take 17 g by mouth daily as needed for moderate constipation.    . predniSONE  (DELTASONE) 5 MG tablet Take 1 tablet (5 mg total) by mouth daily with breakfast. 30 tablet 3  . promethazine (PHENERGAN) 25 MG tablet TAKE 1/2 TO 1 TABLET (12.5-25 MG TOTAL) BY MOUTH EVERY 8 (EIGHT) HOURS AS NEEDED FOR NAUSEA OR VOMITING. 60 tablet 3  . traZODone (DESYREL) 50 MG tablet Take 1-2 tablets (50-100 mg total) by mouth at bedtime as needed for sleep. 60 tablet 2  . nitroGLYCERIN (NITROSTAT) 0.4 MG SL tablet Place 1 tablet (0.4 mg total) under the tongue every 5 (five) minutes as needed. (Patient not taking: Reported on 08/13/2019) 25 tablet 3   No current facility-administered medications for this visit.    PHYSICAL EXAMINATION: ECOG PERFORMANCE STATUS: 1 - Symptomatic but completely ambulatory  BP (!) 137/92 (BP Location: Left Arm, Patient Position: Sitting, Cuff Size: Normal)   Pulse 100   Temp (!) 97.5 F (36.4 C) (Tympanic)   Resp 16   Ht '5\' 11"'  (1.803 m)   Wt 151 lb (68.5 kg)   SpO2 97%   BMI 21.06 kg/m   Filed Weights   09/03/19 0957  Weight: 151 lb (68.5 kg)    Physical Exam Constitutional:      Comments:  Frail-appearing Caucasian male patient.  He is in a walking with a cane.  He is accompanied by his wife.  HENT:     Head: Normocephalic and atraumatic.     Mouth/Throat:     Pharynx: No oropharyngeal exudate.  Eyes:     Pupils: Pupils are equal, round, and reactive to light.  Cardiovascular:     Rate and Rhythm: Normal rate and regular rhythm.  Pulmonary:     Effort: No respiratory distress.     Breath sounds: No wheezing.  Abdominal:     General: Bowel sounds are normal. There is no distension.     Palpations: Abdomen is soft. There is no mass.     Tenderness: There is no abdominal tenderness. There is no guarding or rebound.  Musculoskeletal:        General: No tenderness. Normal range of motion.     Cervical back: Normal range of motion and neck supple.     Comments: Bilateral lower extremity swelling tenderness on deep palpation.  Skin:     General: Skin is warm.     Comments: Macular rash on hands  Neurological:     Mental Status: He is alert and oriented to person, place, and time.  Psychiatric:        Mood and Affect: Affect normal.     LABORATORY DATA:  I have reviewed the data as listed    Component  Value Date/Time   NA 133 (L) 09/02/2019 1142   NA 140 07/13/2013 1542   K 3.5 09/02/2019 1142   K 4.3 07/13/2013 1542   CL 100 09/02/2019 1142   CL 107 07/13/2013 1542   CO2 25 09/02/2019 1142   CO2 26 07/13/2013 1542   GLUCOSE 143 (H) 09/02/2019 1142   GLUCOSE 106 (H) 07/13/2013 1542   BUN 9 09/02/2019 1142   BUN 11 07/13/2013 1542   CREATININE 0.84 09/02/2019 1142   CREATININE 0.76 10/15/2017 1001   CALCIUM 9.2 09/02/2019 1142   CALCIUM 9.9 07/13/2013 1542   PROT 6.0 (L) 09/02/2019 1142   PROT 8.1 07/13/2013 1542   ALBUMIN 3.6 09/02/2019 1142   ALBUMIN 4.3 07/13/2013 1542   AST 13 (L) 09/02/2019 1142   AST 26 07/13/2013 1542   ALT 9 09/02/2019 1142   ALT 32 07/13/2013 1542   ALKPHOS 55 09/02/2019 1142   ALKPHOS 74 07/13/2013 1542   BILITOT 0.4 09/02/2019 1142   BILITOT 0.4 07/13/2013 1542   GFRNONAA >60 09/02/2019 1142   GFRNONAA 95 10/15/2017 1001   GFRAA >60 09/02/2019 1142   GFRAA 110 10/15/2017 1001    No results found for: SPEP, UPEP  Lab Results  Component Value Date   WBC 9.2 09/02/2019   NEUTROABS 6.2 09/02/2019   HGB 12.8 (L) 09/02/2019   HCT 39.0 09/02/2019   MCV 93.1 09/02/2019   PLT 257 09/02/2019      Chemistry      Component Value Date/Time   NA 133 (L) 09/02/2019 1142   NA 140 07/13/2013 1542   K 3.5 09/02/2019 1142   K 4.3 07/13/2013 1542   CL 100 09/02/2019 1142   CL 107 07/13/2013 1542   CO2 25 09/02/2019 1142   CO2 26 07/13/2013 1542   BUN 9 09/02/2019 1142   BUN 11 07/13/2013 1542   CREATININE 0.84 09/02/2019 1142   CREATININE 0.76 10/15/2017 1001      Component Value Date/Time   CALCIUM 9.2 09/02/2019 1142   CALCIUM 9.9 07/13/2013 1542   ALKPHOS 55  09/02/2019 1142   ALKPHOS 74 07/13/2013 1542   AST 13 (L) 09/02/2019 1142   AST 26 07/13/2013 1542   ALT 9 09/02/2019 1142   ALT 32 07/13/2013 1542   BILITOT 0.4 09/02/2019 1142   BILITOT 0.4 07/13/2013 1542     RADIOGRAPHIC STUDIES: I have personally reviewed the radiological images as listed and agreed with the findings in the report. No results found.   ASSESSMENT & PLAN:  Prostate cancer metastatic to bone ([place]) # Castrate resistant prostate cancer metastases to bone. ADT; Taxotere q 2 weeks; MAY 2021- PET- Improved; however PSA- rising.Currently on cabazitaxel s/p #1- poorly tolerated. PSA today is 540./[facility].   # HOLD chemo today-secondary to poor tolerance [see below]; had a long discussion the patient/wife regarding the difficult situation where PSA seems to be rising/and he continues to have poor tolerance to chemotherapy. See discussion below  # severe fatigue/ worsening neuropathy-secondary cabazitaxel. Proceed with IV fluids steroids today.  #Headache/bony pain-likely secondary Neulasta; currently improved.  #Bilateral swelling in the legs/neuropathy-see above. Compression stockings.   # Malignancy related pain- continue fentanyl patch hydrocodone;. Continue fentanyl patch to every 48 hours and continue oxycodone prn; stable.  #Prognosis: I had a long discussion the patient and his wife regarding given his poor tolerance to therapy/and progressive disease I think it is reasonable to reevaluate his ongoing treatments in the context of his declining performance status/quality of  life. If patient continues to have poor tolerance to therapy /no significant improvement of his symptoms discontinuation of therapy/hospice would be reasonable. Would recommend evaluation with palliative care at next visit.  # DISPOSITION:  # HOLD chemo today;HOLD Firmagon; IVFs 1 hour; dex-10 mg IVP # in 2 weeks- MD;  Reva Bores- cbc/cmp/PSA 1 day prior in [place];] Cabazitaxel D-2 neulasta;  Firmagon-Dr.B     No orders of the defined types were placed in this encounter.  All questions were answered. The patient knows to call the clinic with any problems, questions or concerns.      [name], MD 09/04/2019 1:53 PM

## 2019-09-03 NOTE — Telephone Encounter (Signed)
Robert Short (Key: [id number]) - [id number] fentaNYL 100MCG/HR 72 hr patches     Status: PA Response - Approved

## 2019-09-03 NOTE — Assessment & Plan Note (Addendum)
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 ADT; Taxotere q 2 weeks; MAY 2021- PET- Improved; however PSA- rising.Currently on cabazitaxel s/p #1- poorly tolerated. PSA today is 540./[facility].   # HOLD chemo today-secondary to poor tolerance [see below]; had a long discussion the patient/wife regarding the difficult situation where PSA seems to be rising/and he continues to have poor tolerance to chemotherapy. See discussion below  # severe fatigue/ worsening neuropathy-secondary cabazitaxel. Proceed with IV fluids steroids today.  #Headache/bony pain-likely secondary Neulasta; currently improved.  #Bilateral swelling in the legs/neuropathy-see above. Compression stockings.   # Malignancy related pain- continue fentanyl patch hydrocodone;. Continue fentanyl patch to every 48 hours and continue oxycodone prn; stable.  #Prognosis: I had a long discussion the patient and his wife regarding given his poor tolerance to therapy/and progressive disease I think it is reasonable to reevaluate his ongoing treatments in the context of his declining performance status/quality of life. If patient continues to have poor tolerance to therapy /no significant improvement of his symptoms discontinuation of therapy/hospice would be reasonable. Would recommend evaluation with palliative care at next visit.  # DISPOSITION:  # HOLD chemo today;HOLD Firmagon; IVFs 1 hour; dex-10 mg IVP # in 2 weeks- MD;  Reva Bores- cbc/cmp/PSA 1 day prior in [place];] Cabazitaxel D-2 neulasta; Firmagon-Dr.B   # 40 minutes face-to-face with the patient discussing the above plan of care; more than 50% of time spent on prognosis/ natural history; counseling and coordination.

## 2019-09-03 NOTE — Progress Notes (Signed)
Pt received 1L of NS and 10mg  of IV decadron in clinic today. Tolerated well.

## 2019-09-04 ENCOUNTER — Inpatient Hospital Stay: Payer: 59

## 2019-09-10 ENCOUNTER — Other Ambulatory Visit: Payer: Self-pay | Admitting: Internal Medicine

## 2019-09-17 ENCOUNTER — Encounter: Payer: Self-pay | Admitting: Hospice and Palliative Medicine

## 2019-09-17 ENCOUNTER — Other Ambulatory Visit: Payer: Self-pay

## 2019-09-17 ENCOUNTER — Inpatient Hospital Stay: Payer: 59

## 2019-09-17 DIAGNOSIS — R5383 Other fatigue: Secondary | ICD-10-CM | POA: Diagnosis not present

## 2019-09-17 DIAGNOSIS — R2243 Localized swelling, mass and lump, lower limb, bilateral: Secondary | ICD-10-CM

## 2019-09-17 DIAGNOSIS — C61 Malignant neoplasm of prostate: Secondary | ICD-10-CM | POA: Diagnosis not present

## 2019-09-17 DIAGNOSIS — T451X5A Adverse effect of antineoplastic and immunosuppressive drugs, initial encounter: Secondary | ICD-10-CM | POA: Diagnosis not present

## 2019-09-17 DIAGNOSIS — G629 Polyneuropathy, unspecified: Secondary | ICD-10-CM | POA: Diagnosis not present

## 2019-09-17 DIAGNOSIS — Z79899 Other long term (current) drug therapy: Secondary | ICD-10-CM | POA: Diagnosis not present

## 2019-09-17 DIAGNOSIS — C7951 Secondary malignant neoplasm of bone: Secondary | ICD-10-CM | POA: Diagnosis not present

## 2019-09-17 LAB — CBC WITH DIFFERENTIAL/PLATELET
Abs Immature Granulocytes: 0.02 10*3/uL (ref 0.00–0.07)
Basophils Absolute: 0 10*3/uL (ref 0.0–0.1)
Basophils Relative: 1 %
Eosinophils Absolute: 0.2 10*3/uL (ref 0.0–0.5)
Eosinophils Relative: 4 %
HCT: 38.4 % — ABNORMAL LOW (ref 39.0–52.0)
Hemoglobin: 12 g/dL — ABNORMAL LOW (ref 13.0–17.0)
Immature Granulocytes: 0 %
Lymphocytes Relative: 32 %
Lymphs Abs: 1.8 10*3/uL (ref 0.7–4.0)
MCH: 29.6 pg (ref 26.0–34.0)
MCHC: 31.3 g/dL (ref 30.0–36.0)
MCV: 94.6 fL (ref 80.0–100.0)
Monocytes Absolute: 0.5 10*3/uL (ref 0.1–1.0)
Monocytes Relative: 9 %
Neutro Abs: 3 10*3/uL (ref 1.7–7.7)
Neutrophils Relative %: 54 %
Platelets: 285 10*3/uL (ref 150–400)
RBC: 4.06 MIL/uL — ABNORMAL LOW (ref 4.22–5.81)
RDW: 15.5 % (ref 11.5–15.5)
WBC: 5.6 10*3/uL (ref 4.0–10.5)
nRBC: 0 % (ref 0.0–0.2)

## 2019-09-17 LAB — COMPREHENSIVE METABOLIC PANEL
ALT: 5 U/L (ref 0–44)
AST: 12 U/L — ABNORMAL LOW (ref 15–41)
Albumin: 3.3 g/dL — ABNORMAL LOW (ref 3.5–5.0)
Alkaline Phosphatase: 49 U/L (ref 38–126)
Anion gap: 8 (ref 5–15)
BUN: 8 mg/dL (ref 8–23)
CO2: 30 mmol/L (ref 22–32)
Calcium: 9 mg/dL (ref 8.9–10.3)
Chloride: 99 mmol/L (ref 98–111)
Creatinine, Ser: 0.7 mg/dL (ref 0.61–1.24)
GFR calc Af Amer: >60 mL/min (ref >60)
GFR calc non Af Amer: >60 mL/min (ref >60)
Glucose, Bld: 92 mg/dL (ref 70–99)
Potassium: 3.5 mmol/L (ref 3.5–5.1)
Sodium: 137 mmol/L (ref 135–145)
Total Bilirubin: 0.4 mg/dL (ref 0.3–1.2)
Total Protein: 6 g/dL — ABNORMAL LOW (ref 6.5–8.1)

## 2019-09-17 LAB — PSA: Prostatic Specific Antigen: 479 ng/mL — ABNORMAL HIGH (ref 0.00–4.00)

## 2019-09-17 NOTE — Progress Notes (Signed)
Called pt and verified date of birth and name.  Pt states he is aware of appts with MD and NP tomorrow.  Pt states he is using pain medications and continues to have generalized pain daily.  Pt states poor appetite as well. Pt also reports no significant changes since last seen in clinic.

## 2019-09-18 ENCOUNTER — Inpatient Hospital Stay: Payer: 59

## 2019-09-18 ENCOUNTER — Other Ambulatory Visit: Payer: Self-pay | Admitting: Internal Medicine

## 2019-09-18 ENCOUNTER — Inpatient Hospital Stay (HOSPITAL_BASED_OUTPATIENT_CLINIC_OR_DEPARTMENT_OTHER): Payer: 59 | Admitting: Hospice and Palliative Medicine

## 2019-09-18 ENCOUNTER — Inpatient Hospital Stay (HOSPITAL_BASED_OUTPATIENT_CLINIC_OR_DEPARTMENT_OTHER): Payer: 59 | Admitting: Internal Medicine

## 2019-09-18 DIAGNOSIS — Z515 Encounter for palliative care: Secondary | ICD-10-CM

## 2019-09-18 DIAGNOSIS — C61 Malignant neoplasm of prostate: Secondary | ICD-10-CM

## 2019-09-18 DIAGNOSIS — T451X5A Adverse effect of antineoplastic and immunosuppressive drugs, initial encounter: Secondary | ICD-10-CM | POA: Diagnosis not present

## 2019-09-18 DIAGNOSIS — C7951 Secondary malignant neoplasm of bone: Secondary | ICD-10-CM | POA: Diagnosis not present

## 2019-09-18 DIAGNOSIS — Z79899 Other long term (current) drug therapy: Secondary | ICD-10-CM | POA: Diagnosis not present

## 2019-09-18 DIAGNOSIS — I25118 Atherosclerotic heart disease of native coronary artery with other forms of angina pectoris: Secondary | ICD-10-CM

## 2019-09-18 DIAGNOSIS — G629 Polyneuropathy, unspecified: Secondary | ICD-10-CM | POA: Diagnosis not present

## 2019-09-18 DIAGNOSIS — R5383 Other fatigue: Secondary | ICD-10-CM | POA: Diagnosis not present

## 2019-09-18 DIAGNOSIS — Z7189 Other specified counseling: Secondary | ICD-10-CM

## 2019-09-18 MED ORDER — SODIUM CHLORIDE 0.9 % IV SOLN
16.0000 mg/m2 | Freq: Once | INTRAVENOUS | Status: AC
  Administered 2019-09-18: 30 mg via INTRAVENOUS
  Filled 2019-09-18: qty 3

## 2019-09-18 MED ORDER — DIPHENHYDRAMINE HCL 50 MG/ML IJ SOLN
25.0000 mg | Freq: Once | INTRAMUSCULAR | Status: AC
  Administered 2019-09-18: 25 mg via INTRAVENOUS
  Filled 2019-09-18: qty 1

## 2019-09-18 MED ORDER — SODIUM CHLORIDE 0.9% FLUSH
10.0000 mL | INTRAVENOUS | Status: DC | PRN
  Filled 2019-09-18: qty 10

## 2019-09-18 MED ORDER — HEPARIN SOD (PORK) LOCK FLUSH 100 UNIT/ML IV SOLN
500.0000 [IU] | Freq: Once | INTRAVENOUS | Status: DC | PRN
  Filled 2019-09-18: qty 5

## 2019-09-18 MED ORDER — OXYCODONE HCL 15 MG PO TABS
15.0000 mg | ORAL_TABLET | Freq: Four times a day (QID) | ORAL | 0 refills | Status: DC | PRN
Start: 2019-09-18 — End: 2019-10-14

## 2019-09-18 MED ORDER — SODIUM CHLORIDE 0.9 % IV SOLN
Freq: Once | INTRAVENOUS | Status: AC
  Filled 2019-09-18: qty 250

## 2019-09-18 MED ORDER — SODIUM CHLORIDE 0.9 % IV SOLN
10.0000 mg | Freq: Once | INTRAVENOUS | Status: AC
  Administered 2019-09-18: 10 mg via INTRAVENOUS
  Filled 2019-09-18: qty 10

## 2019-09-18 MED ORDER — FAMOTIDINE IN NACL 20-0.9 MG/50ML-% IV SOLN
20.0000 mg | Freq: Once | INTRAVENOUS | Status: AC
  Administered 2019-09-18: 20 mg via INTRAVENOUS
  Filled 2019-09-18: qty 50

## 2019-09-18 NOTE — Assessment & Plan Note (Addendum)
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 ADT; Taxotere q 2 weeks; MAY 2021- PET- Improved; however PSA- rising.Currently on cabazitaxel s/p #1- poorly tolerated [see below] PSA today is 479;   # Proceed with chemo/cabazitaxel at 80% dose [16 mg/m2 dose]-given the poor tolerance to chemotherapy. Labs today reviewed;  acceptable for treatment today. Declines Firmagon today; wants tomorrow.   # Malignancy related pain- continue fentanyl patch hydrocodone;. Continue fentanyl patch to every 48 hours and continue oxycodone prn; stable. Will refill oxycodone.  # DISPOSITION:  # chemo today; HOLD firmagon # add firmagon -9/17; as planned neulasta [afternoon appt] # 10 days- MD; labs- cbc/cmp;possible IVFs 1 hour-Dr.B

## 2019-09-18 NOTE — Progress Notes (Signed)
[facility] OFFICE PROGRESS NOTE  Patient Care Team: Olin Hauser, [name], MD as PCP - Cardiology (Cardiology) Minna Merritts, [name], [name]  Cancer Staging No matching staging information was found for the patient.   Oncology History Overview Note  # 2011- PROSTATE CANCER [Gleason 3+4]; s/p Prostatectomy [ also involved bladder neck/ECP; Dr.Polaseck]; July 2014- Biochemical recurrence [PSA 14]- started on Zoladex [Dr.Pandit]; lost to follow up.  # JAN 2017- STAGE IV METASTATIC PROSTATE Cancer to Bone- Feb 13th, 2017-  Lupron q 5m[~end of feb]; PSA: 1021; Declined Chemo; April 2017 [xofigo x6; Dr.Crystal]; AUG 2017- Zytiga + Prednisone BID. Bone scan-Jan 2018- improved skeletal metastases.   # MAY 1st 2019- START X-tandi [stopped zytiga/PSA- 8.8/rising]  #August 23, 2018-stop Xtandi [intolerance-fatigue mental fogginess]  #Mid September 2020-apalutamide; stopped mid-October poor tolerance/progression  # 11/01/2018- -Taxotere weekly [consent]; July 2021-progression of disease/PSA   # 08/13/2019-cabazitaxel; Neulasta  # Mets to bone- start X-geva q 12M [May 30th]; hold dental issues/infections  # Smoker/ chronic pain/pain clinic   #[April 2018; [place]]- s/p stenting; on Plavix; --------------------------------------------------------------  DIAGNOSIS: '[ ]'  Castrate resistant prostate cancer  STAGE:   4    ;GOALS: Palliative  CURRENT/MOST RECENT THERAPY-cabazitaxel    [place]  12/08/2014 Initial Diagnosis   [place]   11/01/2018 - 07/09/2019 Chemotherapy   The patient had DOCEtaxel (TAXOTERE) 70 mg in sodium chloride 0.9 % 150 mL chemo infusion, 36 mg/m2 = 70 mg, Intravenous,  Once, 19 of 21 cycles Administration: 70 mg (11/08/2018),  70 mg (11/22/2018), 70 mg (12/13/2018), 70 mg (12/06/2018), 70 mg (12/31/2018), 70 mg (01/07/2019), 70 mg (01/29/2019), 70 mg (12/20/2018), 70 mg (02/06/2019), 70 mg (02/14/2019), 70 mg (02/28/2019), 70 mg (03/14/2019), 70 mg (03/28/2019), 70 mg (04/15/2019), 70 mg (04/29/2019), 70 mg (05/27/2019), 70 mg (06/24/2019), 70 mg (07/09/2019)  for chemotherapy treatment.    08/13/2019 -  Chemotherapy   The patient had pegfilgrastim (NEULASTA) injection 6 mg, 6 mg, Subcutaneous, Once, 3 of 6 cycles Administration: 6 mg (08/14/2019) cabazitaxel (JEVTANA) 38 mg in sodium chloride 0.9 % 250 mL chemo infusion, 20 mg/m2 = 38 mg, Intravenous,  Once, 3 of 6 cycles Dose modification: 16 mg/m2 (original dose 20 mg/m2, Cycle 3, Reason: Provider Judgment) Administration: 38 mg (08/13/2019)  for chemotherapy treatment.     INTERVAL HISTORY:  Robert [id number].  male pleasant patient above history of metastatic castrate resistant prostate cancer currently on cabazitaxel is here for follow-up.  Patient had chemotherapy cycle #1 approximately 5 weeks ago.  States his fatigue is improved. Swelling the legs improved. Continues to have tingling numbness. Not any worse.   Review of Systems  Constitutional: Positive for malaise/fatigue and weight loss. Negative for chills, diaphoresis and fever.  HENT: Negative for nosebleeds and sore throat.   Eyes: Negative for double vision.  Respiratory: Positive for shortness of breath. Negative for cough, hemoptysis, sputum production and wheezing.   Cardiovascular: Positive for leg swelling. Negative for chest pain, palpitations and orthopnea.  Gastrointestinal: Positive for nausea. Negative for abdominal pain, blood in stool, diarrhea, heartburn, melena and vomiting.  Genitourinary: Negative for dysuria, frequency and urgency.  Musculoskeletal: Positive for back pain and joint pain.  Skin: Negative for itching.  Neurological: Negative for dizziness, tingling, focal weakness, weakness and  headaches.  Endo/Heme/Allergies: Does not  bruise/bleed easily.  Psychiatric/Behavioral: Negative for depression. The patient is not nervous/anxious and does not have insomnia.       PAST MEDICAL HISTORY :  Past Medical History:  Diagnosis Date  . Back pain 10/09/2012  . Bone cancer ([place])   . CAD (coronary artery disease)    a. 08/2015 PCI: LM nl, LAD 20d, LCX nl, RCA 60m RPAV 95 (3.0x18 Xience Alpine DES);  b. 04/2016 PCI ([facility]: RPL 95 (2.75x8 Promus Premier DES).  . Cancer associated pain   . Depression   . History of echocardiogram    a. 08/2016 Echo: EF 50-55%.  .Marland KitchenHistory of kidney stones   . Hyperlipidemia   . Hypertension   . Joint pain   . Prostate cancer ([facility]    a.  s/p prostatectomy ([facility]);  b. bone mets noted 04/2016.  . Right arm pain 01/10/2016  . Right foot pain 01/10/2016  . Right leg pain 01/10/2016  . Tobacco abuse     PAST SURGICAL HISTORY :   Past Surgical History:  Procedure Laterality Date  . CARDIAC CATHETERIZATION     [facility]  . CARDIAC CATHETERIZATION N/A 08/16/2015   Procedure: Left Heart Cath and Coronary Angiography;  Surgeon: DYolonda Kida MD;  [facility];  Service: Cardiovascular;  Laterality: N/A;  . CARDIAC CATHETERIZATION N/A 08/16/2015   Procedure: Coronary Stent Intervention;  Surgeon: DYolonda Kida MD;  [facility];  Service: Cardiovascular;  Laterality: N/A;  . CERVIX SURGERY    . PROSTATECTOMY    . SPINE SURGERY    . TONSILLECTOMY    . WRIST SURGERY      FAMILY HISTORY :   Family History  Problem Relation Age of Onset  . Heart attack Mother   . Hypertension Mother   . Heart attack Father     SOCIAL HISTORY:   Social History   Tobacco Use  . Smoking status: Former Smoker    Packs/day: 0.50    Years: 40.00    Pack years: 20.00    Types: Cigarettes  . Smokeless tobacco: Current User    Types: Chew  . Tobacco comment: 1 pack every couple days   Vaping Use  . Vaping Use: Never  used  Substance Use Topics  . Alcohol use: Yes    Comment: occasionally  . Drug use: No    ALLERGIES:  is allergic to ditropan [oxybutynin].  MEDICATIONS:  Current Outpatient Medications  Medication Sig Dispense Refill  . acetaminophen (TYLENOL) 500 MG tablet Take 1,000 mg by mouth every 8 (eight) hours as needed.     .Marland Kitchenalbuterol (PROVENTIL HFA;VENTOLIN HFA) 108 (90 Base) MCG/ACT inhaler Inhale 1-2 puffs into the lungs every 6 (six) hours as needed for wheezing or shortness of breath. 1 Inhaler 0  . aspirin 81 MG tablet Take 81 mg by mouth daily.    .Marland Kitchenatorvastatin (LIPITOR) 80 MG tablet Take 1 tablet (80 mg total) by mouth daily at 6 PM. 90 tablet 3  . clopidogrel (PLAVIX) 75 MG tablet Take 1 tablet (75 mg total) by mouth daily. 90 tablet 3  . fentaNYL (DURAGESIC) 100 MCG/HR Place 1 patch onto the skin every other day. Along with 232m patch for total of 125 mcg every 2 days. 15 patch 0  . fentaNYL (DURAGESIC) 25 MCG/HR Place 1 patch onto the skin every other day. Use this along with fentanyl patch 100 mcg [total 125 mcg] every 2 days 15 patch 0  . gabapentin (  NEURONTIN) 100 MG capsule Take 1 capsule (100 mg total) by mouth 3 (three) times daily. 90 capsule 5  . metoprolol tartrate (LOPRESSOR) 25 MG tablet TAKE 1/2 TABLET BY MOUTH 2 TIMES DAILY. 90 tablet 1  . naloxone (NARCAN) nasal spray 4 mg/0.1 mL 1 spray into nostril upon signs of opioid overdose. Call 911. May repeat once if no response within 2-3 minutes. 1 kit 0  . nitroGLYCERIN (NITROSTAT) 0.4 MG SL tablet Place 1 tablet (0.4 mg total) under the tongue every 5 (five) minutes as needed. 25 tablet 3  . oxyCODONE (ROXICODONE) 15 MG immediate release tablet Take 1 tablet (15 mg total) by mouth every 6 (six) hours as needed. for pain 90 tablet 0  . polyethylene glycol (MIRALAX / GLYCOLAX) packet Take 17 g by mouth daily as needed for moderate constipation.    . predniSONE (DELTASONE) 5 MG tablet TAKE 1 TABLET BY MOUTH DAILY WITH  BREAKFAST. 30 tablet 3  . promethazine (PHENERGAN) 25 MG tablet TAKE 1/2 TO 1 TABLET (12.5-25 MG TOTAL) BY MOUTH EVERY 8 (EIGHT) HOURS AS NEEDED FOR NAUSEA OR VOMITING. 60 tablet 3  . traZODone (DESYREL) 50 MG tablet Take 1-2 tablets (50-100 mg total) by mouth at bedtime as needed for sleep. 60 tablet 2   No current facility-administered medications for this visit.   Facility-Administered Medications Ordered in Other Visits  Medication Dose Route Frequency Provider Last Rate Last Admin  . cabazitaxel (JEVTANA) 30 mg in sodium chloride 0.9 % 250 mL chemo infusion  16 mg/m2 (Treatment Plan Recorded) Intravenous Once [name], MD      . dexamethasone (DECADRON) 10 mg in sodium chloride 0.9 % 50 mL IVPB  10 mg Intravenous Once Charlaine Dalton R, MD      . famotidine (PEPCID) IVPB 20 mg premix  20 mg Intravenous Once Charlaine Dalton R, MD 200 mL/hr at 09/18/19 1217 20 mg at 09/18/19 1217  . heparin lock flush 100 unit/mL  500 Units Intracatheter Once PRN Charlaine Dalton R, MD      . sodium chloride flush (NS) 0.9 % injection 10 mL  10 mL Intracatheter PRN [name], MD        PHYSICAL EXAMINATION: ECOG PERFORMANCE STATUS: 1 - Symptomatic but completely ambulatory  BP (!) 145/86 (BP Location: Right Arm, Patient Position: Sitting)   Pulse 90   Temp 97.7 F (36.5 C) (Tympanic)   Resp 16   Wt 153 lb 9.6 oz (69.7 kg)   SpO2 100%   BMI 21.42 kg/m   Filed Weights   09/18/19 1051  Weight: 153 lb 9.6 oz (69.7 kg)    Physical Exam Constitutional:      Comments:  Frail-appearing Caucasian male patient.  He is in a wheelchair.Marland Kitchen  He is accompanied by his wife.  HENT:     Head: Normocephalic and atraumatic.     Mouth/Throat:     Pharynx: No oropharyngeal exudate.  Eyes:     Pupils: Pupils are equal, round, and reactive to light.  Cardiovascular:     Rate and Rhythm: Normal rate and regular rhythm.  Pulmonary:     Effort: No respiratory distress.      Breath sounds: No wheezing.  Abdominal:     General: Bowel sounds are normal. There is no distension.     Palpations: Abdomen is soft. There is no mass.     Tenderness: There is no abdominal tenderness. There is no guarding or rebound.  Musculoskeletal:  General: No tenderness. Normal range of motion.     Cervical back: Normal range of motion and neck supple.     Comments: .  Skin:    General: Skin is warm.     Comments: Macular rash on hands  Neurological:     Mental Status: He is alert and oriented to person, place, and time.  Psychiatric:        Mood and Affect: Affect normal.     LABORATORY DATA:  I have reviewed the data as listed    Component Value Date/Time   NA 137 09/17/2019 1311   NA 140 07/13/2013 1542   K 3.5 09/17/2019 1311   K 4.3 07/13/2013 1542   CL 99 09/17/2019 1311   CL 107 07/13/2013 1542   CO2 30 09/17/2019 1311   CO2 26 07/13/2013 1542   GLUCOSE 92 09/17/2019 1311   GLUCOSE 106 (H) 07/13/2013 1542   BUN 8 09/17/2019 1311   BUN 11 07/13/2013 1542   CREATININE 0.70 09/17/2019 1311   CREATININE 0.76 10/15/2017 1001   CALCIUM 9.0 09/17/2019 1311   CALCIUM 9.9 07/13/2013 1542   PROT 6.0 (L) 09/17/2019 1311   PROT 8.1 07/13/2013 1542   ALBUMIN 3.3 (L) 09/17/2019 1311   ALBUMIN 4.3 07/13/2013 1542   AST 12 (L) 09/17/2019 1311   AST 26 07/13/2013 1542   ALT 5 09/17/2019 1311   ALT 32 07/13/2013 1542   ALKPHOS 49 09/17/2019 1311   ALKPHOS 74 07/13/2013 1542   BILITOT 0.4 09/17/2019 1311   BILITOT 0.4 07/13/2013 1542   GFRNONAA >60 09/17/2019 1311   GFRNONAA 95 10/15/2017 1001   GFRAA >60 09/17/2019 1311   GFRAA 110 10/15/2017 1001    No results found for: SPEP, UPEP  Lab Results  Component Value Date   WBC 5.6 09/17/2019   NEUTROABS 3.0 09/17/2019   HGB 12.0 (L) 09/17/2019   HCT 38.4 (L) 09/17/2019   MCV 94.6 09/17/2019   PLT 285 09/17/2019      Chemistry      Component Value Date/Time   NA 137 09/17/2019 1311   NA 140  07/13/2013 1542   K 3.5 09/17/2019 1311   K 4.3 07/13/2013 1542   CL 99 09/17/2019 1311   CL 107 07/13/2013 1542   CO2 30 09/17/2019 1311   CO2 26 07/13/2013 1542   BUN 8 09/17/2019 1311   BUN 11 07/13/2013 1542   CREATININE 0.70 09/17/2019 1311   CREATININE 0.76 10/15/2017 1001      Component Value Date/Time   CALCIUM 9.0 09/17/2019 1311   CALCIUM 9.9 07/13/2013 1542   ALKPHOS 49 09/17/2019 1311   ALKPHOS 74 07/13/2013 1542   AST 12 (L) 09/17/2019 1311   AST 26 07/13/2013 1542   ALT 5 09/17/2019 1311   ALT 32 07/13/2013 1542   BILITOT 0.4 09/17/2019 1311   BILITOT 0.4 07/13/2013 1542     RADIOGRAPHIC STUDIES: I have personally reviewed the radiological images as listed and agreed with the findings in the report. No results found.   ASSESSMENT & PLAN:  Prostate cancer metastatic to bone ([place]) # Castrate resistant prostate cancer metastases to bone. ADT; Taxotere q 2 weeks; MAY 2021- PET- Improved; however PSA- rising.Currently on cabazitaxel s/p #1- poorly tolerated [see below] PSA today is 479;   # Proceed with chemo/cabazitaxel at 80% dose [16 mg/m2 dose]-given the poor tolerance to chemotherapy. Labs today reviewed;  acceptable for treatment today. Declines Firmagon today; wants tomorrow.   # Malignancy  related pain- continue fentanyl patch hydrocodone;. Continue fentanyl patch to every 48 hours and continue oxycodone prn; stable. Will refill oxycodone.  # DISPOSITION:  # chemo today; HOLD firmagon # add firmagon -9/17; as planned neulasta [afternoon appt] # 10 days- MD; labs- cbc/cmp;possible IVFs 1 hour-Dr.B      Orders Placed This Encounter  Procedures  . CBC with Differential    Standing Status:   Future    Standing Expiration Date:   09/17/2020  . Comprehensive metabolic panel    Standing Status:   Future    Standing Expiration Date:   09/17/2020   All questions were answered. The patient knows to call the clinic with any problems, questions or concerns.       [name], MD 09/18/2019 12:30 PM

## 2019-09-19 ENCOUNTER — Other Ambulatory Visit: Payer: Self-pay

## 2019-09-19 ENCOUNTER — Inpatient Hospital Stay: Payer: 59

## 2019-09-19 DIAGNOSIS — C61 Malignant neoplasm of prostate: Secondary | ICD-10-CM | POA: Diagnosis not present

## 2019-09-19 DIAGNOSIS — C7951 Secondary malignant neoplasm of bone: Secondary | ICD-10-CM | POA: Diagnosis not present

## 2019-09-19 DIAGNOSIS — Z79899 Other long term (current) drug therapy: Secondary | ICD-10-CM | POA: Diagnosis not present

## 2019-09-19 DIAGNOSIS — T451X5A Adverse effect of antineoplastic and immunosuppressive drugs, initial encounter: Secondary | ICD-10-CM | POA: Diagnosis not present

## 2019-09-19 DIAGNOSIS — R5383 Other fatigue: Secondary | ICD-10-CM | POA: Diagnosis not present

## 2019-09-19 DIAGNOSIS — G629 Polyneuropathy, unspecified: Secondary | ICD-10-CM | POA: Diagnosis not present

## 2019-09-19 DIAGNOSIS — Z7189 Other specified counseling: Secondary | ICD-10-CM

## 2019-09-19 MED ORDER — DEGARELIX ACETATE 80 MG ~~LOC~~ SOLR
80.0000 mg | Freq: Once | SUBCUTANEOUS | Status: AC
  Administered 2019-09-19: 80 mg via SUBCUTANEOUS
  Filled 2019-09-19: qty 4

## 2019-09-19 MED ORDER — PEGFILGRASTIM INJECTION 6 MG/0.6ML ~~LOC~~
6.0000 mg | PREFILLED_SYRINGE | Freq: Once | SUBCUTANEOUS | Status: AC
  Administered 2019-09-19: 6 mg via SUBCUTANEOUS
  Filled 2019-09-19: qty 0.6

## 2019-09-19 NOTE — Progress Notes (Signed)
Error. Patient not seen.

## 2019-09-22 ENCOUNTER — Inpatient Hospital Stay: Payer: 59 | Admitting: Hospice and Palliative Medicine

## 2019-10-01 ENCOUNTER — Encounter: Payer: Self-pay | Admitting: Internal Medicine

## 2019-10-01 ENCOUNTER — Other Ambulatory Visit: Payer: Self-pay

## 2019-10-01 ENCOUNTER — Other Ambulatory Visit: Payer: Self-pay | Admitting: Internal Medicine

## 2019-10-01 DIAGNOSIS — C61 Malignant neoplasm of prostate: Secondary | ICD-10-CM

## 2019-10-01 DIAGNOSIS — G893 Neoplasm related pain (acute) (chronic): Secondary | ICD-10-CM

## 2019-10-01 DIAGNOSIS — C7951 Secondary malignant neoplasm of bone: Secondary | ICD-10-CM

## 2019-10-01 MED ORDER — FENTANYL 25 MCG/HR TD PT72
1.0000 | MEDICATED_PATCH | TRANSDERMAL | 0 refills | Status: DC
Start: 2019-10-01 — End: 2019-10-23

## 2019-10-01 MED ORDER — FENTANYL 100 MCG/HR TD PT72: 1.0000 | MEDICATED_PATCH | TRANSDERMAL | 0 refills | Status: DC

## 2019-10-01 NOTE — Telephone Encounter (Signed)
Dr. B pt 

## 2019-10-01 NOTE — Telephone Encounter (Signed)
Patient sent mychart message requesting refill of fentanyl patches. Please advise.

## 2019-10-03 ENCOUNTER — Inpatient Hospital Stay: Payer: 59 | Attending: Internal Medicine

## 2019-10-03 ENCOUNTER — Other Ambulatory Visit: Payer: Self-pay

## 2019-10-03 ENCOUNTER — Encounter: Payer: Self-pay | Admitting: Internal Medicine

## 2019-10-03 ENCOUNTER — Inpatient Hospital Stay: Payer: 59

## 2019-10-03 ENCOUNTER — Inpatient Hospital Stay (HOSPITAL_BASED_OUTPATIENT_CLINIC_OR_DEPARTMENT_OTHER): Payer: 59 | Admitting: Internal Medicine

## 2019-10-03 ENCOUNTER — Other Ambulatory Visit: Payer: Self-pay | Admitting: *Deleted

## 2019-10-03 DIAGNOSIS — I10 Essential (primary) hypertension: Secondary | ICD-10-CM | POA: Diagnosis not present

## 2019-10-03 DIAGNOSIS — E785 Hyperlipidemia, unspecified: Secondary | ICD-10-CM | POA: Diagnosis not present

## 2019-10-03 DIAGNOSIS — Z9079 Acquired absence of other genital organ(s): Secondary | ICD-10-CM | POA: Diagnosis not present

## 2019-10-03 DIAGNOSIS — R112 Nausea with vomiting, unspecified: Secondary | ICD-10-CM | POA: Insufficient documentation

## 2019-10-03 DIAGNOSIS — G893 Neoplasm related pain (acute) (chronic): Secondary | ICD-10-CM | POA: Insufficient documentation

## 2019-10-03 DIAGNOSIS — F329 Major depressive disorder, single episode, unspecified: Secondary | ICD-10-CM | POA: Diagnosis not present

## 2019-10-03 DIAGNOSIS — C7951 Secondary malignant neoplasm of bone: Secondary | ICD-10-CM

## 2019-10-03 DIAGNOSIS — I2510 Atherosclerotic heart disease of native coronary artery without angina pectoris: Secondary | ICD-10-CM | POA: Insufficient documentation

## 2019-10-03 DIAGNOSIS — Z7982 Long term (current) use of aspirin: Secondary | ICD-10-CM | POA: Insufficient documentation

## 2019-10-03 DIAGNOSIS — Z7952 Long term (current) use of systemic steroids: Secondary | ICD-10-CM | POA: Insufficient documentation

## 2019-10-03 DIAGNOSIS — C61 Malignant neoplasm of prostate: Secondary | ICD-10-CM | POA: Insufficient documentation

## 2019-10-03 DIAGNOSIS — K1231 Oral mucositis (ulcerative) due to antineoplastic therapy: Secondary | ICD-10-CM | POA: Diagnosis not present

## 2019-10-03 DIAGNOSIS — T451X5A Adverse effect of antineoplastic and immunosuppressive drugs, initial encounter: Secondary | ICD-10-CM | POA: Diagnosis not present

## 2019-10-03 DIAGNOSIS — Z87891 Personal history of nicotine dependence: Secondary | ICD-10-CM | POA: Insufficient documentation

## 2019-10-03 DIAGNOSIS — Z8249 Family history of ischemic heart disease and other diseases of the circulatory system: Secondary | ICD-10-CM | POA: Insufficient documentation

## 2019-10-03 DIAGNOSIS — I25118 Atherosclerotic heart disease of native coronary artery with other forms of angina pectoris: Secondary | ICD-10-CM

## 2019-10-03 DIAGNOSIS — Z87442 Personal history of urinary calculi: Secondary | ICD-10-CM | POA: Diagnosis not present

## 2019-10-03 DIAGNOSIS — Z79899 Other long term (current) drug therapy: Secondary | ICD-10-CM | POA: Insufficient documentation

## 2019-10-03 LAB — COMPREHENSIVE METABOLIC PANEL
ALT: 7 U/L (ref 0–44)
AST: 13 U/L — ABNORMAL LOW (ref 15–41)
Albumin: 3.3 g/dL — ABNORMAL LOW (ref 3.5–5.0)
Alkaline Phosphatase: 76 U/L (ref 38–126)
Anion gap: 10 (ref 5–15)
BUN: 8 mg/dL (ref 8–23)
CO2: 27 mmol/L (ref 22–32)
Calcium: 8.7 mg/dL — ABNORMAL LOW (ref 8.9–10.3)
Chloride: 98 mmol/L (ref 98–111)
Creatinine, Ser: 0.75 mg/dL (ref 0.61–1.24)
GFR calc Af Amer: >60 mL/min (ref >60)
GFR calc non Af Amer: >60 mL/min (ref >60)
Glucose, Bld: 117 mg/dL — ABNORMAL HIGH (ref 70–99)
Potassium: 3.6 mmol/L (ref 3.5–5.1)
Sodium: 135 mmol/L (ref 135–145)
Total Bilirubin: 0.5 mg/dL (ref 0.3–1.2)
Total Protein: 5.7 g/dL — ABNORMAL LOW (ref 6.5–8.1)

## 2019-10-03 LAB — CBC WITH DIFFERENTIAL/PLATELET
Abs Immature Granulocytes: 0.13 10*3/uL — ABNORMAL HIGH (ref 0.00–0.07)
Basophils Absolute: 0.1 10*3/uL (ref 0.0–0.1)
Basophils Relative: 0 %
Eosinophils Absolute: 0.1 10*3/uL (ref 0.0–0.5)
Eosinophils Relative: 1 %
HCT: 35.7 % — ABNORMAL LOW (ref 39.0–52.0)
Hemoglobin: 12 g/dL — ABNORMAL LOW (ref 13.0–17.0)
Immature Granulocytes: 1 %
Lymphocytes Relative: 12 %
Lymphs Abs: 1.6 10*3/uL (ref 0.7–4.0)
MCH: 30.6 pg (ref 26.0–34.0)
MCHC: 33.6 g/dL (ref 30.0–36.0)
MCV: 91.1 fL (ref 80.0–100.0)
Monocytes Absolute: 0.8 10*3/uL (ref 0.1–1.0)
Monocytes Relative: 6 %
Neutro Abs: 10.5 10*3/uL — ABNORMAL HIGH (ref 1.7–7.7)
Neutrophils Relative %: 80 %
Platelets: 225 10*3/uL (ref 150–400)
RBC: 3.92 MIL/uL — ABNORMAL LOW (ref 4.22–5.81)
RDW: 16 % — ABNORMAL HIGH (ref 11.5–15.5)
WBC: 13.3 10*3/uL — ABNORMAL HIGH (ref 4.0–10.5)
nRBC: 0 % (ref 0.0–0.2)

## 2019-10-03 MED ORDER — MAGIC MOUTHWASH: 5.0000 mL | Freq: Four times a day (QID) | ORAL | 0 refills | Status: AC

## 2019-10-03 MED ORDER — SODIUM CHLORIDE 0.9 % IV SOLN
Freq: Once | INTRAVENOUS | Status: AC
  Filled 2019-10-03: qty 250

## 2019-10-03 MED ORDER — SODIUM CHLORIDE 0.9 % IV SOLN
INTRAVENOUS | Status: DC
  Filled 2019-10-03: qty 250

## 2019-10-03 MED ORDER — DEXAMETHASONE SODIUM PHOSPHATE 10 MG/ML IJ SOLN
10.0000 mg | Freq: Once | INTRAMUSCULAR | Status: AC
  Administered 2019-10-03: 10 mg via INTRAVENOUS
  Filled 2019-10-03: qty 1

## 2019-10-03 MED ORDER — PROCHLORPERAZINE MALEATE 10 MG PO TABS
10.0000 mg | ORAL_TABLET | Freq: Once | ORAL | Status: AC
  Administered 2019-10-03: 10 mg via ORAL
  Filled 2019-10-03: qty 1

## 2019-10-03 NOTE — Assessment & Plan Note (Addendum)
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 ADT;  MAY 2021- PET- Improved; however PSA- rising. Currently on cabazitaxel s/p #2 [80% dose]- poorly tolerated [see below]   # with regards to prostate cancer-patient has progressive disease; but poor tolerance to chemotherapy.  Would recommend holding further chemotherapy at this time.  Patient is leaning towards discontinuation of further chemotherapy given the quality of life issues.  # Nausea/ vomiting/extreme fatigue- plan IVFs/Comazine/Dex 10 mg   # Mucositis- sec to chemo- recommend magic mouth wash.   # Malignancy related pain- continue fentanyl patch hydrocodone;. Continue fentanyl patch to every 48 hours and continue oxycodone prn; stable.  # DISPOSITION:  # IVFs today # 10 days- MD; labs- cbc/cmp;PSA-possible IVFs 1 hour-Dr.B

## 2019-10-03 NOTE — Progress Notes (Signed)
[place] OFFICE PROGRESS NOTE  Patient Care Team: Olin Hauser, [name], MD as PCP - Cardiology (Cardiology) Minna Merritts, [name], [name]  Cancer Staging No matching staging information was found for the patient.   Oncology History Overview Note  # 2011- PROSTATE CANCER [Gleason 3+4]; s/p Prostatectomy [ also involved bladder neck/ECP; Dr.Polaseck]; July 2014- Biochemical recurrence [PSA 14]- started on Zoladex [Dr.Pandit]; lost to follow up.  # JAN 2017- STAGE IV METASTATIC PROSTATE Cancer to Bone- Feb 13th, 2017-  Lupron q 20m[~end of feb]; PSA: 1021; Declined Chemo; April 2017 [xofigo x6; Dr.Crystal]; AUG 2017- Zytiga + Prednisone BID. Bone scan-Jan 2018- improved skeletal metastases.   # MAY 1st 2019- START X-tandi [stopped zytiga/PSA- 8.8/rising]  #August 23, 2018-stop Xtandi [intolerance-fatigue mental fogginess]  #Mid September 2020-apalutamide; stopped mid-October poor tolerance/progression  # 11/01/2018- -Taxotere weekly [consent]; July 2021-progression of disease/PSA   # 08/13/2019-cabazitaxel; Neulasta  # Mets to bone- start X-geva q 79M [May 30th]; hold dental issues/infections  # Smoker/ chronic pain/pain clinic   #[April 2018; [place]]- s/p stenting; on Plavix; --------------------------------------------------------------  DIAGNOSIS: '[ ]'  Castrate resistant prostate cancer  STAGE:   4    ;GOALS: Palliative  CURRENT/MOST RECENT THERAPY-cabazitaxel    [place]  12/08/2014 Initial Diagnosis   [place]   11/01/2018 - 07/09/2019 Chemotherapy   The patient had DOCEtaxel (TAXOTERE) 70 mg in sodium chloride 0.9 % 150 mL chemo infusion, 36 mg/m2 = 70 mg, Intravenous,  Once, 19 of 21 cycles Administration: 70 mg (11/08/2018),  70 mg (11/22/2018), 70 mg (12/13/2018), 70 mg (12/06/2018), 70 mg (12/31/2018), 70 mg (01/07/2019), 70 mg (01/29/2019), 70 mg (12/20/2018), 70 mg (02/06/2019), 70 mg (02/14/2019), 70 mg (02/28/2019), 70 mg (03/14/2019), 70 mg (03/28/2019), 70 mg (04/15/2019), 70 mg (04/29/2019), 70 mg (05/27/2019), 70 mg (06/24/2019), 70 mg (07/09/2019)  for chemotherapy treatment.    08/13/2019 -  Chemotherapy   The patient had pegfilgrastim (NEULASTA) injection 6 mg, 6 mg, Subcutaneous, Once, 3 of 6 cycles Administration: 6 mg (08/14/2019), 6 mg (09/19/2019) cabazitaxel (JEVTANA) 38 mg in sodium chloride 0.9 % 250 mL chemo infusion, 20 mg/m2 = 38 mg, Intravenous,  Once, 3 of 6 cycles Dose modification: 16 mg/m2 (original dose 20 mg/m2, Cycle 3, Reason: Provider Judgment) Administration: 38 mg (08/13/2019), 30 mg (09/18/2019)  for chemotherapy treatment.     INTERVAL HISTORY:  SSCOTT [id number].[id number].  male pleasant patient above history of metastatic castrate resistant prostate cancer currently on cabazitaxel is here for follow-up.  Patient had chemotherapy cycle #2 approximately 10 days ago.  Patient complains of extreme fatigue.  Complains of nausea with vomiting.  Patient took Zofran this afternoon.  Positive for weight loss.  Poor appetite.  No diarrhea.  Feels extremely poorly.  Review of Systems  Constitutional: Positive for malaise/fatigue and weight loss. Negative for chills, diaphoresis and fever.  HENT: Negative for nosebleeds and sore throat.   Eyes: Negative for double vision.  Respiratory: Positive for shortness of breath. Negative for cough, hemoptysis, sputum production and wheezing.   Cardiovascular: Positive for leg swelling. Negative for chest pain, palpitations and orthopnea.  Gastrointestinal: Positive for nausea. Negative for abdominal pain, blood in stool, diarrhea, heartburn, melena and vomiting.  Genitourinary: Negative for dysuria, frequency and urgency.  Musculoskeletal: Positive for back pain and  joint pain.  Skin: Negative for itching.  Neurological: Negative for dizziness, tingling, focal weakness, weakness and headaches.  Endo/Heme/Allergies: Does not bruise/bleed easily.  Psychiatric/Behavioral: Negative for depression. The patient is not nervous/anxious and does not have insomnia.       PAST MEDICAL HISTORY :  Past Medical History:  Diagnosis Date  . Back pain 10/09/2012  . Bone cancer ([place])   . CAD (coronary artery disease)    a. 08/2015 PCI: LM nl, LAD 20d, LCX nl, RCA 40m RPAV 95 (3.0x18 Xience Alpine DES);  b. 04/2016 PCI ([facility]: RPL 95 (2.75x8 Promus Premier DES).  . Cancer associated pain   . Depression   . History of echocardiogram    a. 08/2016 Echo: EF 50-55%.  .Marland KitchenHistory of kidney stones   . Hyperlipidemia   . Hypertension   . Joint pain   . Prostate cancer ([facility]    a.  s/p prostatectomy ([facility]);  b. bone mets noted 04/2016.  . Right arm pain 01/10/2016  . Right foot pain 01/10/2016  . Right leg pain 01/10/2016  . Tobacco abuse     PAST SURGICAL HISTORY :   Past Surgical History:  Procedure Laterality Date  . CARDIAC CATHETERIZATION     [facility]  . CARDIAC CATHETERIZATION N/A 08/16/2015   Procedure: Left Heart Cath and Coronary Angiography;  Surgeon: DYolonda Kida MD;  Location: [facility];  Service: Cardiovascular;  Laterality: N/A;  . CARDIAC CATHETERIZATION N/A 08/16/2015   Procedure: Coronary Stent Intervention;  Surgeon: DYolonda Kida MD;  Location: [facility];  Service: Cardiovascular;  Laterality: N/A;  . CERVIX SURGERY    . PROSTATECTOMY    . SPINE SURGERY    . TONSILLECTOMY    . WRIST SURGERY      FAMILY HISTORY :   Family History  Problem Relation Age of Onset  . Heart attack Mother   . Hypertension Mother   . Heart attack Father     SOCIAL HISTORY:   Social History   Tobacco Use  . Smoking status: Former Smoker    Packs/day: 0.50    Years: 40.00    Pack years: 20.00    Types: Cigarettes  .  Smokeless tobacco: Current User    Types: Chew  . Tobacco comment: 1 pack every couple days   Vaping Use  . Vaping Use: Never used  Substance Use Topics  . Alcohol use: Yes    Comment: occasionally  . Drug use: No    ALLERGIES:  is allergic to ditropan [oxybutynin].  MEDICATIONS:  Current Outpatient Medications  Medication Sig Dispense Refill  . acetaminophen (TYLENOL) 500 MG tablet Take 1,000 mg by mouth every 8 (eight) hours as needed.     .Marland Kitchenalbuterol (PROVENTIL HFA;VENTOLIN HFA) 108 (90 Base) MCG/ACT inhaler Inhale 1-2 puffs into the lungs every 6 (six) hours as needed for wheezing or shortness of breath. 1 Inhaler 0  . aspirin 81 MG tablet Take 81 mg by mouth daily.    .Marland Kitchenatorvastatin (LIPITOR) 80 MG tablet Take 1 tablet (80 mg total) by mouth daily at 6 PM. 90 tablet 3  . clopidogrel (PLAVIX) 75 MG tablet Take 1 tablet (75 mg total) by mouth daily. 90 tablet 3  . fentaNYL (DURAGESIC) 100 MCG/HR Place 1 patch onto the skin every other day. Along with 278m patch for total of 125 mcg every 2 days. 15 patch 0  . fentaNYL (DURAGESIC) 25 MCG/HR Place 1 patch onto the skin every other day. Use  this along with fentanyl patch 100 mcg [total 125 mcg] every 2 days 15 patch 0  . gabapentin (NEURONTIN) 100 MG capsule Take 1 capsule (100 mg total) by mouth 3 (three) times daily. 90 capsule 5  . metoprolol tartrate (LOPRESSOR) 25 MG tablet TAKE 1/2 TABLET BY MOUTH 2 TIMES DAILY. 90 tablet 1  . ondansetron (ZOFRAN) 8 MG tablet Take 8 mg by mouth every 8 (eight) hours as needed for nausea or vomiting.    Marland Kitchen oxyCODONE (ROXICODONE) 15 MG immediate release tablet Take 1 tablet (15 mg total) by mouth every 6 (six) hours as needed. for pain 90 tablet 0  . predniSONE (DELTASONE) 5 MG tablet TAKE 1 TABLET BY MOUTH DAILY WITH BREAKFAST. 30 tablet 3  . promethazine (PHENERGAN) 25 MG tablet TAKE 1/2 TO 1 TABLET (12.5-25 MG TOTAL) BY MOUTH EVERY 8 (EIGHT) HOURS AS NEEDED FOR NAUSEA OR VOMITING. 60 tablet 3  .  traZODone (DESYREL) 50 MG tablet Take 1-2 tablets (50-100 mg total) by mouth at bedtime as needed for sleep. 60 tablet 2  . naloxone (NARCAN) nasal spray 4 mg/0.1 mL 1 spray into nostril upon signs of opioid overdose. Call 911. May repeat once if no response within 2-3 minutes. (Patient not taking: Reported on 10/03/2019) 1 kit 0  . nitroGLYCERIN (NITROSTAT) 0.4 MG SL tablet Place 1 tablet (0.4 mg total) under the tongue every 5 (five) minutes as needed. (Patient not taking: Reported on 10/03/2019) 25 tablet 3  . polyethylene glycol (MIRALAX / GLYCOLAX) packet Take 17 g by mouth daily as needed for moderate constipation. (Patient not taking: Reported on 10/03/2019)     No current facility-administered medications for this visit.   Facility-Administered Medications Ordered in Other Visits  Medication Dose Route Frequency Provider Last Rate Last Admin  . 0.9 %  sodium chloride infusion   Intravenous Continuous Alysiah Suppa, Lenetta Quaker R, MD      . 0.9 %  sodium chloride infusion   Intravenous Once Charlaine Dalton R, MD      . dexamethasone (DECADRON) injection 10 mg  10 mg Intravenous Once [name], MD        PHYSICAL EXAMINATION: ECOG PERFORMANCE STATUS: 1 - Symptomatic but completely ambulatory  BP (!) 131/94   Pulse 89   Temp (!) 96.3 F (35.7 C) (Tympanic)   Resp 18   Ht '5\' 11"'  (1.803 m)   Wt 153 lb (69.4 kg)   BMI 21.34 kg/m   Filed Weights   10/03/19 1405  Weight: 153 lb (69.4 kg)    Physical Exam Constitutional:      Comments:  [place] Caucasian male patient.  He is in a wheelchair.Marland Kitchen  He is accompanied by his wife.  HENT:     Head: Normocephalic and atraumatic.     Mouth/Throat:     Pharynx: No oropharyngeal exudate.  Eyes:     Pupils: Pupils are equal, round, and reactive to light.  Cardiovascular:     Rate and Rhythm: Normal rate and regular rhythm.  Pulmonary:     Effort: No respiratory distress.     Breath sounds: No wheezing.  Abdominal:      General: Bowel sounds are normal. There is no distension.     Palpations: Abdomen is soft. There is no mass.     Tenderness: There is no abdominal tenderness. There is no guarding or rebound.  Musculoskeletal:        General: No tenderness. Normal range of motion.     Cervical back:  Normal range of motion and neck supple.     Comments: .  Skin:    General: Skin is warm.     Comments: Macular rash on hands  Neurological:     Mental Status: He is alert and oriented to person, place, and time.  Psychiatric:        Mood and Affect: Affect normal.     LABORATORY DATA:  I have reviewed the data as listed    Component Value Date/Time   NA 135 10/03/2019 1349   NA 140 07/13/2013 1542   K 3.6 10/03/2019 1349   K 4.3 07/13/2013 1542   CL 98 10/03/2019 1349   CL 107 07/13/2013 1542   CO2 27 10/03/2019 1349   CO2 26 07/13/2013 1542   GLUCOSE 117 (H) 10/03/2019 1349   GLUCOSE 106 (H) 07/13/2013 1542   BUN 8 10/03/2019 1349   BUN 11 07/13/2013 1542   CREATININE 0.75 10/03/2019 1349   CREATININE 0.76 10/15/2017 1001   CALCIUM 8.7 (L) 10/03/2019 1349   CALCIUM 9.9 07/13/2013 1542   PROT 5.7 (L) 10/03/2019 1349   PROT 8.1 07/13/2013 1542   ALBUMIN 3.3 (L) 10/03/2019 1349   ALBUMIN 4.3 07/13/2013 1542   AST 13 (L) 10/03/2019 1349   AST 26 07/13/2013 1542   ALT 7 10/03/2019 1349   ALT 32 07/13/2013 1542   ALKPHOS 76 10/03/2019 1349   ALKPHOS 74 07/13/2013 1542   BILITOT 0.5 10/03/2019 1349   BILITOT 0.4 07/13/2013 1542   GFRNONAA >60 10/03/2019 1349   GFRNONAA 95 10/15/2017 1001   GFRAA >60 10/03/2019 1349   GFRAA 110 10/15/2017 1001    No results found for: SPEP, UPEP  Lab Results  Component Value Date   WBC 13.3 (H) 10/03/2019   NEUTROABS 10.5 (H) 10/03/2019   HGB 12.0 (L) 10/03/2019   HCT 35.7 (L) 10/03/2019   MCV 91.1 10/03/2019   PLT 225 10/03/2019      Chemistry      Component Value Date/Time   NA 135 10/03/2019 1349   NA 140 07/13/2013 1542   K 3.6  10/03/2019 1349   K 4.3 07/13/2013 1542   CL 98 10/03/2019 1349   CL 107 07/13/2013 1542   CO2 27 10/03/2019 1349   CO2 26 07/13/2013 1542   BUN 8 10/03/2019 1349   BUN 11 07/13/2013 1542   CREATININE 0.75 10/03/2019 1349   CREATININE 0.76 10/15/2017 1001      Component Value Date/Time   CALCIUM 8.7 (L) 10/03/2019 1349   CALCIUM 9.9 07/13/2013 1542   ALKPHOS 76 10/03/2019 1349   ALKPHOS 74 07/13/2013 1542   AST 13 (L) 10/03/2019 1349   AST 26 07/13/2013 1542   ALT 7 10/03/2019 1349   ALT 32 07/13/2013 1542   BILITOT 0.5 10/03/2019 1349   BILITOT 0.4 07/13/2013 1542     RADIOGRAPHIC STUDIES: I have personally reviewed the radiological images as listed and agreed with the findings in the report. No results found.   ASSESSMENT & PLAN:  Prostate cancer metastatic to bone ([place]) # Castrate resistant prostate cancer metastases to bone. ADT;  MAY 2021- PET- Improved; however PSA- rising. Currently on cabazitaxel s/p #2 [80% dose]- poorly tolerated [see below]   # with regards to prostate cancer-patient has progressive disease; but poor tolerance to chemotherapy.  Would recommend holding further chemotherapy at this time.  Patient is leaning towards discontinuation of further chemotherapy given the quality of life issues.  # Nausea/ vomiting/extreme fatigue- plan  IVFs/Comazine/Dex 10 mg   # Mucositis- sec to chemo- recommend magic mouth wash.   # Malignancy related pain- continue fentanyl patch hydrocodone;. Continue fentanyl patch to every 48 hours and continue oxycodone prn; stable.  # DISPOSITION:  # IVFs today # 10 days- MD; labs- cbc/cmp;PSA-possible IVFs 1 hour-Dr.B      Orders Placed This Encounter  Procedures  . CBC with Differential    Standing Status:   Future    Standing Expiration Date:   10/02/2020  . Comprehensive metabolic panel    Standing Status:   Future    Standing Expiration Date:   10/02/2020  . PSA    Standing Status:   Future    Standing  Expiration Date:   10/02/2020   All questions were answered. The patient knows to call the clinic with any problems, questions or concerns.      [name], MD 10/03/2019 2:40 PM

## 2019-10-06 ENCOUNTER — Inpatient Hospital Stay (HOSPITAL_BASED_OUTPATIENT_CLINIC_OR_DEPARTMENT_OTHER): Payer: 59 | Admitting: Hospice and Palliative Medicine

## 2019-10-06 DIAGNOSIS — Z515 Encounter for palliative care: Secondary | ICD-10-CM

## 2019-10-06 DIAGNOSIS — Z710 Person encountering health services to consult on behalf of another person: Secondary | ICD-10-CM

## 2019-10-06 NOTE — Progress Notes (Signed)
I called and spoke with patient's wife by phone. She reports that patient has been doing poorly over the past several weeks with minimal oral intake. She says that his stomach hurts when he does eat. Discussed that she could start OTC omeprazole 20 mg daily. He is having regular bowel movements. Pain is otherwise controlled with analgesic regimen. She says that patient has mostly decided to stop chemotherapy. Briefly discussed the option of hospice involvement if his his primary goal is to remain home and focus on comfort/quality of life. Patient is scheduled for a follow-up visit next week with Dr. Rogue Bussing. We will schedule a follow-up visit with me on same day.

## 2019-10-14 ENCOUNTER — Inpatient Hospital Stay (HOSPITAL_BASED_OUTPATIENT_CLINIC_OR_DEPARTMENT_OTHER): Payer: 59 | Admitting: Hospice and Palliative Medicine

## 2019-10-14 ENCOUNTER — Inpatient Hospital Stay: Payer: 59

## 2019-10-14 ENCOUNTER — Other Ambulatory Visit: Payer: Self-pay | Admitting: Internal Medicine

## 2019-10-14 ENCOUNTER — Encounter: Payer: Self-pay | Admitting: Internal Medicine

## 2019-10-14 ENCOUNTER — Other Ambulatory Visit: Payer: Self-pay

## 2019-10-14 ENCOUNTER — Inpatient Hospital Stay (HOSPITAL_BASED_OUTPATIENT_CLINIC_OR_DEPARTMENT_OTHER): Payer: 59 | Admitting: Internal Medicine

## 2019-10-14 DIAGNOSIS — I25118 Atherosclerotic heart disease of native coronary artery with other forms of angina pectoris: Secondary | ICD-10-CM

## 2019-10-14 DIAGNOSIS — Z515 Encounter for palliative care: Secondary | ICD-10-CM

## 2019-10-14 DIAGNOSIS — G893 Neoplasm related pain (acute) (chronic): Secondary | ICD-10-CM | POA: Diagnosis not present

## 2019-10-14 DIAGNOSIS — Z87891 Personal history of nicotine dependence: Secondary | ICD-10-CM | POA: Diagnosis not present

## 2019-10-14 DIAGNOSIS — C7951 Secondary malignant neoplasm of bone: Secondary | ICD-10-CM | POA: Diagnosis not present

## 2019-10-14 DIAGNOSIS — C61 Malignant neoplasm of prostate: Secondary | ICD-10-CM

## 2019-10-14 DIAGNOSIS — F329 Major depressive disorder, single episode, unspecified: Secondary | ICD-10-CM | POA: Diagnosis not present

## 2019-10-14 DIAGNOSIS — T451X5A Adverse effect of antineoplastic and immunosuppressive drugs, initial encounter: Secondary | ICD-10-CM | POA: Diagnosis not present

## 2019-10-14 DIAGNOSIS — K1231 Oral mucositis (ulcerative) due to antineoplastic therapy: Secondary | ICD-10-CM | POA: Diagnosis not present

## 2019-10-14 DIAGNOSIS — R112 Nausea with vomiting, unspecified: Secondary | ICD-10-CM | POA: Diagnosis not present

## 2019-10-14 DIAGNOSIS — E860 Dehydration: Secondary | ICD-10-CM

## 2019-10-14 DIAGNOSIS — E785 Hyperlipidemia, unspecified: Secondary | ICD-10-CM | POA: Diagnosis not present

## 2019-10-14 LAB — CBC WITH DIFFERENTIAL/PLATELET
Abs Immature Granulocytes: 0.03 10*3/uL (ref 0.00–0.07)
Basophils Absolute: 0 10*3/uL (ref 0.0–0.1)
Basophils Relative: 1 %
Eosinophils Absolute: 0.1 10*3/uL (ref 0.0–0.5)
Eosinophils Relative: 2 %
HCT: 38.5 % — ABNORMAL LOW (ref 39.0–52.0)
Hemoglobin: 12.6 g/dL — ABNORMAL LOW (ref 13.0–17.0)
Immature Granulocytes: 0 %
Lymphocytes Relative: 21 %
Lymphs Abs: 1.9 10*3/uL (ref 0.7–4.0)
MCH: 30.8 pg (ref 26.0–34.0)
MCHC: 32.7 g/dL (ref 30.0–36.0)
MCV: 94.1 fL (ref 80.0–100.0)
Monocytes Absolute: 0.9 10*3/uL (ref 0.1–1.0)
Monocytes Relative: 10 %
Neutro Abs: 5.8 10*3/uL (ref 1.7–7.7)
Neutrophils Relative %: 66 %
Platelets: 241 10*3/uL (ref 150–400)
RBC: 4.09 MIL/uL — ABNORMAL LOW (ref 4.22–5.81)
RDW: 16.4 % — ABNORMAL HIGH (ref 11.5–15.5)
WBC: 8.7 10*3/uL (ref 4.0–10.5)
nRBC: 0 % (ref 0.0–0.2)

## 2019-10-14 LAB — COMPREHENSIVE METABOLIC PANEL
ALT: 8 U/L (ref 0–44)
AST: 12 U/L — ABNORMAL LOW (ref 15–41)
Albumin: 3.4 g/dL — ABNORMAL LOW (ref 3.5–5.0)
Alkaline Phosphatase: 60 U/L (ref 38–126)
Anion gap: 7 (ref 5–15)
BUN: 8 mg/dL (ref 8–23)
CO2: 28 mmol/L (ref 22–32)
Calcium: 8.9 mg/dL (ref 8.9–10.3)
Chloride: 102 mmol/L (ref 98–111)
Creatinine, Ser: 0.71 mg/dL (ref 0.61–1.24)
GFR, Estimated: >60 mL/min (ref >60)
Glucose, Bld: 126 mg/dL — ABNORMAL HIGH (ref 70–99)
Potassium: 3.7 mmol/L (ref 3.5–5.1)
Sodium: 137 mmol/L (ref 135–145)
Total Bilirubin: 0.6 mg/dL (ref 0.3–1.2)
Total Protein: 5.7 g/dL — ABNORMAL LOW (ref 6.5–8.1)

## 2019-10-14 LAB — PSA: Prostatic Specific Antigen: 588 ng/mL — ABNORMAL HIGH (ref 0.00–4.00)

## 2019-10-14 MED ORDER — SODIUM CHLORIDE 0.9 % IV SOLN
INTRAVENOUS | Status: DC
  Filled 2019-10-14 (×2): qty 250

## 2019-10-14 MED ORDER — DEXAMETHASONE SODIUM PHOSPHATE 10 MG/ML IJ SOLN
10.0000 mg | Freq: Once | INTRAMUSCULAR | Status: AC
  Administered 2019-10-14: 10 mg via INTRAVENOUS

## 2019-10-14 NOTE — Assessment & Plan Note (Addendum)
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 ADT;  MAY 2021- PET- Improved; however PSA- rising.  Most recently on cabazitaxel s/p #2 [80% dose]- poorly tolerated [see below]   #Patient had extreme poor tolerance to chemotherapy-worsening fatigue nausea vomiting diarrhea/worsening neuropathy; and significant decline in the quality of life.  He declines further chemotherapy which is very reasonable [see below]  # Extreme fatigue- plan IVFs/Dex 10 mg every 2 weeks or so.  Labs reviewed no significant electrolyte imbalance noted.  # Malignancy related pain- continue fentanyl patch hydrocodone;. Continue fentanyl patch to every 48 hours and continue oxycodone prn; STABLE.   # Prognosis/natural history: A long discussion with patient and wife regarding his choice between quality of life versus continued chemotherapy-especially his quality of life significant declines while on chemotherapy.  Discussed regarding discontinuation of chemotherapy with which patient is in agreement.  Discussed regarding hospice philosophy with the goal to keep him home/take care of symptoms.  We will make a hospice referral.  Patient understands that he will not be allowed IV fluids/steroids IV while on hospice.  However patient will continue IV fluids/dexamethasone for now.  Discussed with [name].   # DISPOSITION: # hospice referral re: metastatic prostate cancer  # IVFs today; dex 10mg  IVP.  # 2 weeks- MD; labs- cbc/cmp;PSA-possible IVFs 1 hour; dex-Dr.B

## 2019-10-14 NOTE — Progress Notes (Signed)
Patient tolerated fluids well No complaints at time of discharge. Patient and wife agreed to a signed DNR by Dr Rogue Bussing. Form given to wife with instructions  to keep it somewhat visible in case someone calls 911 they will see it and not resuscitate patient per his wishes.

## 2019-10-14 NOTE — Progress Notes (Signed)
[place]  [phone] [phone]   Name: Robert Short Date: 10/14/2019 MRN: [id number]  DOB: 1950-05-13  Patient Care Team: Olin Hauser, DO as PCP - General (Family Medicine) [name], [name], MD as PCP - Cardiology (Cardiology) Minna Merritts, [name], [name]    REASON FOR CONSULTATION: Palliative Care consult requested for this 69 y.o. male with multiple medical problems including stage IV prostate cancer metastatic to bone status post Xtandi and most recently on cabazitaxel but has tolerated treatment poorly and now with rising PSAs.  Patient has had multiple symptoms including fatigue and pain.  He was referred to palliative care to help with symptom management and to address goals.  SOCIAL HISTORY:     reports that he has quit smoking. His smoking use included cigarettes. He has a 20.00 pack-year smoking history. His smokeless tobacco use includes chew. He reports current alcohol use. He reports that he does not use drugs.   Patient is married and lives at home with his wife. He has five children. Patient used to work as a Pharmacist, hospital, Administrator, and Engineer, production.  ADVANCE DIRECTIVES:  Does not have  CODE STATUS:   PAST MEDICAL HISTORY: Past Medical History:  Diagnosis Date  . Back pain 10/09/2012  . Bone cancer ([place])   . CAD (coronary artery disease)    a. 08/2015 PCI: LM nl, LAD 20d, LCX nl, RCA 36m RPAV 95 (3.0x18 Xience Alpine DES);  b. 04/2016 PCI ([facility]: RPL 95 (2.75x8 Promus Premier DES).  . Cancer associated pain   . Depression   . History of echocardiogram    a. 08/2016 Echo: EF 50-55%.  .Marland KitchenHistory of kidney stones   . Hyperlipidemia   . Hypertension   . Joint pain   . Prostate cancer ([facility]    a.  s/p prostatectomy ([facility]);  b. bone mets noted 04/2016.  . Right arm pain  01/10/2016  . Right foot pain 01/10/2016  . Right leg pain 01/10/2016  . Tobacco abuse     PAST SURGICAL HISTORY:  Past Surgical History:  Procedure Laterality Date  . CARDIAC CATHETERIZATION     [facility]  . CARDIAC CATHETERIZATION N/A 08/16/2015   Procedure: Left Heart Cath and Coronary Angiography;  Surgeon: DYolonda Kida MD;  [facility];  Service: Cardiovascular;  Laterality: N/A;  . CARDIAC CATHETERIZATION N/A 08/16/2015   Procedure: Coronary Stent Intervention;  Surgeon: DYolonda Kida MD;  [facility];  Service: Cardiovascular;  Laterality: N/A;  . CERVIX SURGERY    . PROSTATECTOMY    . SPINE SURGERY    . TONSILLECTOMY    . WRIST SURGERY      HEMATOLOGY/ONCOLOGY HISTORY:  Oncology History Overview Note  # 2011- PROSTATE CANCER [Gleason 3+4]; s/p Prostatectomy [ also involved bladder neck/ECP; Dr.Polaseck]; July 2014- Biochemical recurrence [PSA 14]- started on Zoladex [Dr.Pandit]; lost to follow up.  # JAN 2017- STAGE IV METASTATIC PROSTATE Cancer to Bone- Feb 13th, 2017-  Lupron q 362m~end of feb]; PSA: 1021; Declined Chemo; April 2017 [xofigo x6; Dr.Crystal]; AUG 2017- Zytiga + Prednisone BID. Bone scan-Jan 2018- improved skeletal metastases.   # MAY 1st 2019- START X-tandi [stopped zytiga/PSA- 8.8/rising]  #August 23, 2018-stop Xtandi [intolerance-fatigue mental fogginess]  #Mid September 2020-apalutamide; stopped mid-October poor tolerance/progression  # 11/01/2018- -Taxotere weekly [consent]; July 2021-progression of disease/PSA   #  08/13/2019-cabazitaxel; Neulasta  # Mets to bone- start X-geva q 39M [May 30th]; hold dental issues/infections  # Smoker/ chronic pain/pain clinic   #[April 2018; [place]]- s/p stenting; on Plavix; --------------------------------------------------------------  DIAGNOSIS: _0  Castrate resistant prostate cancer  STAGE:   4    ;GOALS: Palliative  CURRENT/MOST RECENT  THERAPY-cabazitaxel    Prostate cancer metastatic to bone ([place])  12/08/2014 Initial Diagnosis   Prostate cancer metastatic to bone ([place])   11/01/2018 - 07/09/2019 Chemotherapy   The patient had DOCEtaxel (TAXOTERE) 70 mg in sodium chloride 0.9 % 150 mL chemo infusion, 36 mg/m2 = 70 mg, Intravenous,  Once, 19 of 21 cycles Administration: 70 mg (11/08/2018), 70 mg (11/22/2018), 70 mg (12/13/2018), 70 mg (12/06/2018), 70 mg (12/31/2018), 70 mg (01/07/2019), 70 mg (01/29/2019), 70 mg (12/20/2018), 70 mg (02/06/2019), 70 mg (02/14/2019), 70 mg (02/28/2019), 70 mg (03/14/2019), 70 mg (03/28/2019), 70 mg (04/15/2019), 70 mg (04/29/2019), 70 mg (05/27/2019), 70 mg (06/24/2019), 70 mg (07/09/2019)  for chemotherapy treatment.    08/13/2019 -  Chemotherapy   The patient had pegfilgrastim (NEULASTA) injection 6 mg, 6 mg, Subcutaneous, Once, 3 of 6 cycles Administration: 6 mg (08/14/2019), 6 mg (09/19/2019) cabazitaxel (JEVTANA) 38 mg in sodium chloride 0.9 % 250 mL chemo infusion, 20 mg/m2 = 38 mg, Intravenous,  Once, 3 of 6 cycles Dose modification: 16 mg/m2 (original dose 20 mg/m2, Cycle 3, Reason: Provider Judgment) Administration: 38 mg (08/13/2019), 30 mg (09/18/2019)  for chemotherapy treatment.      ALLERGIES:  is allergic to ditropan [oxybutynin].  MEDICATIONS:  Current Outpatient Medications  Medication Sig Dispense Refill  . acetaminophen (TYLENOL) 500 MG tablet Take 1,000 mg by mouth every 8 (eight) hours as needed.     Marland Kitchen albuterol (PROVENTIL HFA;VENTOLIN HFA) 108 (90 Base) MCG/ACT inhaler Inhale 1-2 puffs into the lungs every 6 (six) hours as needed for wheezing or shortness of breath. 1 Inhaler 0  . aspirin 81 MG tablet Take 81 mg by mouth daily.    Marland Kitchen atorvastatin (LIPITOR) 80 MG tablet Take 1 tablet (80 mg total) by mouth daily at 6 PM. 90 tablet 3  . clopidogrel (PLAVIX) 75 MG tablet Take 1 tablet (75 mg total) by mouth daily. 90 tablet 3  . fentaNYL (DURAGESIC) 100 MCG/HR Place 1 patch onto the skin  every other day. Along with 28mg patch for total of 125 mcg every 2 days. 15 patch 0  . fentaNYL (DURAGESIC) 25 MCG/HR Place 1 patch onto the skin every other day. Use this along with fentanyl patch 100 mcg [total 125 mcg] every 2 days 15 patch 0  . gabapentin (NEURONTIN) 100 MG capsule Take 1 capsule (100 mg total) by mouth 3 (three) times daily. 90 capsule 5  . magic mouthwash SOLN Take 5 mLs by mouth 4 (four) times daily. 240 mL 0  . metoprolol tartrate (LOPRESSOR) 25 MG tablet TAKE 1/2 TABLET BY MOUTH 2 TIMES DAILY. 90 tablet 1  . naloxone (NARCAN) nasal spray 4 mg/0.1 mL 1 spray into nostril upon signs of opioid overdose. Call 911. May repeat once if no response within 2-3 minutes. (Patient not taking: Reported on 10/03/2019) 1 kit 0  . nitroGLYCERIN (NITROSTAT) 0.4 MG SL tablet Place 1 tablet (0.4 mg total) under the tongue every 5 (five) minutes as needed. (Patient not taking: Reported on 10/03/2019) 25 tablet 3  . ondansetron (ZOFRAN) 8 MG tablet Take 8 mg by mouth every 8 (eight) hours as needed for nausea or vomiting.    .Marland Kitchen  oxyCODONE (ROXICODONE) 15 MG immediate release tablet Take 1 tablet (15 mg total) by mouth every 6 (six) hours as needed. for pain 90 tablet 0  . polyethylene glycol (MIRALAX / GLYCOLAX) packet Take 17 g by mouth daily as needed for moderate constipation. (Patient not taking: Reported on 10/03/2019)    . predniSONE (DELTASONE) 5 MG tablet TAKE 1 TABLET BY MOUTH DAILY WITH BREAKFAST. 30 tablet 3  . promethazine (PHENERGAN) 25 MG tablet TAKE 1/2 TO 1 TABLET (12.5-25 MG TOTAL) BY MOUTH EVERY 8 (EIGHT) HOURS AS NEEDED FOR NAUSEA OR VOMITING. 60 tablet 3  . traZODone (DESYREL) 50 MG tablet Take 1-2 tablets (50-100 mg total) by mouth at bedtime as needed for sleep. 60 tablet 2   No current facility-administered medications for this visit.   Facility-Administered Medications Ordered in Other Visits  Medication Dose Route Frequency Provider Last Rate Last Admin  . 0.9 %  sodium  chloride infusion   Intravenous Continuous Charlaine Dalton R, MD      . dexamethasone (DECADRON) injection 10 mg  10 mg Intravenous Once [name], MD        VITAL SIGNS: There were no vitals taken for this visit. There were no vitals filed for this visit.  Estimated body mass index is 20.75 kg/m as calculated from the following:   Height as of an earlier encounter on 10/14/19: _0  (1.803 m).   Weight as of an earlier encounter on 10/14/19: 148 lb 12.8 oz (67.5 kg).  LABS: CBC:    Component Value Date/Time   WBC 8.7 10/14/2019 1313   HGB 12.6 (L) 10/14/2019 1313   HGB 14.4 11/03/2013 1619   HCT 38.5 (L) 10/14/2019 1313   HCT 44.2 11/03/2013 1619   PLT 241 10/14/2019 1313   PLT 149 (L) 11/03/2013 1619   MCV 94.1 10/14/2019 1313   MCV 96 11/03/2013 1619   NEUTROABS 5.8 10/14/2019 1313   NEUTROABS 10.7 (H) 11/03/2013 1619   LYMPHSABS 1.9 10/14/2019 1313   LYMPHSABS 2.0 11/03/2013 1619   MONOABS 0.9 10/14/2019 1313   MONOABS 1.3 (H) 11/03/2013 1619   EOSABS 0.1 10/14/2019 1313   EOSABS 0.1 11/03/2013 1619   BASOSABS 0.0 10/14/2019 1313   BASOSABS 0.1 11/03/2013 1619   Comprehensive Metabolic Panel:    Component Value Date/Time   NA 137 10/14/2019 1313   NA 140 07/13/2013 1542   K 3.7 10/14/2019 1313   K 4.3 07/13/2013 1542   CL 102 10/14/2019 1313   CL 107 07/13/2013 1542   CO2 28 10/14/2019 1313   CO2 26 07/13/2013 1542   BUN 8 10/14/2019 1313   BUN 11 07/13/2013 1542   CREATININE 0.71 10/14/2019 1313   CREATININE 0.76 10/15/2017 1001   GLUCOSE 126 (H) 10/14/2019 1313   GLUCOSE 106 (H) 07/13/2013 1542   CALCIUM 8.9 10/14/2019 1313   CALCIUM 9.9 07/13/2013 1542   AST 12 (L) 10/14/2019 1313   AST 26 07/13/2013 1542   ALT 8 10/14/2019 1313   ALT 32 07/13/2013 1542   ALKPHOS 60 10/14/2019 1313   ALKPHOS 74 07/13/2013 1542   BILITOT 0.6 10/14/2019 1313   BILITOT 0.4 07/13/2013 1542   PROT 5.7 (L) 10/14/2019 1313   PROT 8.1 07/13/2013 1542    ALBUMIN 3.4 (L) 10/14/2019 1313   ALBUMIN 4.3 07/13/2013 1542    RADIOGRAPHIC STUDIES: No results found.  PERFORMANCE STATUS (ECOG) : 2 - Symptomatic, <50% confined to bed  Review of Systems Unless otherwise noted, a complete review  of systems is negative.  Physical Exam General: NAD, frail appearing, thin Pulmonary: Unlabored Extremities: no edema, no joint deformities Skin: no rashes Neurological: Weakness but otherwise nonfocal  IMPRESSION: Routine follow-up visit today.  Patient is accompanied by his wife.  Patient with rising PSA and poor tolerance of chemotherapy.  Patient met earlier with Dr. Rogue Bussing and has decided to forego future treatment and instead focus on comfort/quality of life at home.  Patient is interested in having hospice follow him at home.  I spoke with him today and answered his questions regarding hospice care.  I called in an order for hospice to [facility].   Patient reports good days and bad with pain.  Continue fentanyl/oxycodone.  Case and plan discussed with Dr. Rogue Bussing  PLAN: -Best supportive care -Referral to hospice at home -Continue fentanyl/oxycodone -RTC as needed   Patient expressed understanding and was in agreement with this plan. He also understands that He can call the clinic at any time with any questions, concerns, or complaints.     Time Total: 15 minutes  Visit consisted of counseling and education dealing with the complex and emotionally intense issues of symptom management and palliative care in the setting of serious and potentially life-threatening illness.Greater than 50%  of this time was spent counseling and coordinating care related to the above assessment and plan.  Signed by: Altha Harm, PhD, NP-C [phone] (Work Cell)

## 2019-10-14 NOTE — Progress Notes (Signed)
[place] OFFICE PROGRESS NOTE  Patient Care Team: Olin Hauser, [name], MD as PCP - Cardiology (Cardiology) Minna Merritts, [name], [name]  Cancer Staging No matching staging information was found for the patient.   Oncology History Overview Note  # 2011- PROSTATE CANCER [Gleason 3+4]; s/p Prostatectomy [ also involved bladder neck/ECP; Dr.Polaseck]; July 2014- Biochemical recurrence [PSA 14]- started on Zoladex [Dr.Pandit]; lost to follow up.  # JAN 2017- STAGE IV METASTATIC PROSTATE Cancer to Bone- Feb 13th, 2017-  Lupron q 74m[~end of feb]; PSA: 1021; Declined Chemo; April 2017 [xofigo x6; Dr.Crystal]; AUG 2017- Zytiga + Prednisone BID. Bone scan-Jan 2018- improved skeletal metastases.   # MAY 1st 2019- START X-tandi [stopped zytiga/PSA- 8.8/rising]  #August 23, 2018-stop Xtandi [intolerance-fatigue mental fogginess]  #Mid September 2020-apalutamide; stopped mid-October poor tolerance/progression  # 11/01/2018- -Taxotere weekly [consent]; July 2021-progression of disease/PSA   # 08/13/2019-cabazitaxel; Neulasta  # Mets to bone- start X-geva q 27M [May 30th]; hold dental issues/infections  # Smoker/ chronic pain/pain clinic   #[April 2018; [place]]- s/p stenting; on Plavix; --------------------------------------------------------------  DIAGNOSIS: [ ] Castrate resistant prostate cancer  STAGE:   4    ;GOALS: Palliative  CURRENT/MOST RECENT THERAPY-cabazitaxel    [place]  [place] Initial Diagnosis   [place] [place]   11/01/2018 - 07/09/2019 Chemotherapy   The patient had DOCEtaxel (TAXOTERE) 70 mg in sodium chloride 0.9 % 150 mL chemo infusion, 36 mg/m2 = 70 mg, Intravenous,  Once, 19 of 21 cycles Administration: 70 mg (11/08/2018),  70 mg (11/22/2018), 70 mg (12/13/2018), 70 mg (12/06/2018), 70 mg (12/31/2018), 70 mg (01/07/2019), 70 mg (01/29/2019), 70 mg (12/20/2018), 70 mg (02/06/2019), 70 mg (02/14/2019), 70 mg (02/28/2019), 70 mg (03/14/2019), 70 mg (03/28/2019), 70 mg (04/15/2019), 70 mg (04/29/2019), 70 mg (05/27/2019), 70 mg (06/24/2019), 70 mg (07/09/2019)  for chemotherapy treatment.    08/13/2019 -  Chemotherapy   The patient had pegfilgrastim (NEULASTA) injection 6 mg, 6 mg, Subcutaneous, Once, 3 of 6 cycles Administration: 6 mg (08/14/2019), 6 mg (09/19/2019) cabazitaxel (JEVTANA) 38 mg in sodium chloride 0.9 % 250 mL chemo infusion, 20 mg/m2 = 38 mg, Intravenous,  Once, 3 of 6 cycles Dose modification: 16 mg/m2 (original dose 20 mg/m2, Cycle 3, Reason: Provider Judgment) Administration: 38 mg (08/13/2019), 30 mg (09/18/2019)  for chemotherapy treatment.     INTERVAL HISTORY:  SKEATON BEICHNER89y.o.  male pleasant patient above history of metastatic castrate resistant prostate cancer currently on cabazitaxel is here for follow-up.  Patient had chemotherapy cycle #2 approximately 3 weeks ago.  Continues to still feel very poorly.  Extreme fatigue.  Positive for weight loss.  Intermittent nausea with vomiting and diarrhea.   Shortness of breath on exertion.  Review of Systems  Constitutional: Positive for malaise/fatigue and weight loss. Negative for chills, diaphoresis and fever.  HENT: Negative for nosebleeds and sore throat.   Eyes: Negative for double vision.  Respiratory: Positive for shortness of breath. Negative for cough, hemoptysis, sputum production and wheezing.   Cardiovascular: Positive for leg swelling. Negative for chest pain, palpitations and orthopnea.  Gastrointestinal: Positive for nausea. Negative for abdominal pain, blood in stool, diarrhea, heartburn, melena and vomiting.  Genitourinary: Negative for dysuria, frequency and urgency.  Musculoskeletal: Positive for back pain and joint pain.  Skin: Negative  for  itching.  Neurological: Negative for dizziness, tingling, focal weakness, weakness and headaches.  Endo/Heme/Allergies: Does not bruise/bleed easily.  Psychiatric/Behavioral: Negative for depression. The patient is not nervous/anxious and does not have insomnia.       PAST MEDICAL HISTORY :  Past Medical History:  Diagnosis Date  . Back pain 10/09/2012  . Bone cancer ([place])   . CAD (coronary artery disease)    a. 08/2015 PCI: LM nl, LAD 20d, LCX nl, RCA 26m RPAV 95 (3.0x18 Xience Alpine DES);  b. 04/2016 PCI ([facility]: RPL 95 (2.75x8 Promus Premier DES).  . Cancer associated pain   . Depression   . History of echocardiogram    a. 08/2016 Echo: EF 50-55%.  .Marland KitchenHistory of kidney stones   . Hyperlipidemia   . Hypertension   . Joint pain   . Prostate cancer ([facility]    a.  s/p prostatectomy ([facility]);  b. bone mets noted 04/2016.  . Right arm pain 01/10/2016  . Right foot pain 01/10/2016  . Right leg pain 01/10/2016  . Tobacco abuse     PAST SURGICAL HISTORY :   Past Surgical History:  Procedure Laterality Date  . CARDIAC CATHETERIZATION     [facility]  . CARDIAC CATHETERIZATION N/A 08/16/2015   Procedure: Left Heart Cath and Coronary Angiography;  Surgeon: DYolonda Kida MD;  [facility];  Service: Cardiovascular;  Laterality: N/A;  . CARDIAC CATHETERIZATION N/A 08/16/2015   Procedure: Coronary Stent Intervention;  Surgeon: DYolonda Kida MD;  [facility];  Service: Cardiovascular;  Laterality: N/A;  . CERVIX SURGERY    . PROSTATECTOMY    . SPINE SURGERY    . TONSILLECTOMY    . WRIST SURGERY      FAMILY HISTORY :   Family History  Problem Relation Age of Onset  . Heart attack Mother   . Hypertension Mother   . Heart attack Father     SOCIAL HISTORY:   Social History   Tobacco Use  . Smoking status: Former Smoker    Packs/day: 0.50    Years: 40.00    Pack years: 20.00    Types: Cigarettes  . Smokeless tobacco: Current User     Types: Chew  . Tobacco comment: 1 pack every couple days   Vaping Use  . Vaping Use: Never used  Substance Use Topics  . Alcohol use: Yes    Comment: occasionally  . Drug use: No    ALLERGIES:  is allergic to ditropan [oxybutynin].  MEDICATIONS:  Current Outpatient Medications  Medication Sig Dispense Refill  . acetaminophen (TYLENOL) 500 MG tablet Take 1,000 mg by mouth every 8 (eight) hours as needed.     .Marland Kitchenalbuterol (PROVENTIL HFA;VENTOLIN HFA) 108 (90 Base) MCG/ACT inhaler Inhale 1-2 puffs into the lungs every 6 (six) hours as needed for wheezing or shortness of breath. 1 Inhaler 0  . aspirin 81 MG tablet Take 81 mg by mouth daily.    .Marland Kitchenatorvastatin (LIPITOR) 80 MG tablet Take 1 tablet (80 mg total) by mouth daily at 6 PM. 90 tablet 3  . clopidogrel (PLAVIX) 75 MG tablet Take 1 tablet (75 mg total) by mouth daily. 90 tablet 3  . fentaNYL (DURAGESIC) 100 MCG/HR Place 1 patch onto the skin every other day. Along with 277m patch for total of 125 mcg every 2 days. 15 patch 0  . fentaNYL (DURAGESIC) 25 MCG/HR Place 1 patch onto the skin every other day. Use this along with  fentanyl patch 100 mcg [total 125 mcg] every 2 days 15 patch 0  . gabapentin (NEURONTIN) 100 MG capsule Take 1 capsule (100 mg total) by mouth 3 (three) times daily. 90 capsule 5  . magic mouthwash SOLN Take 5 mLs by mouth 4 (four) times daily. 240 mL 0  . metoprolol tartrate (LOPRESSOR) 25 MG tablet TAKE 1/2 TABLET BY MOUTH 2 TIMES DAILY. 90 tablet 1  . naloxone (NARCAN) nasal spray 4 mg/0.1 mL 1 spray into nostril upon signs of opioid overdose. Call 911. May repeat once if no response within 2-3 minutes. (Patient not taking: Reported on 10/03/2019) 1 kit 0  . nitroGLYCERIN (NITROSTAT) 0.4 MG SL tablet Place 1 tablet (0.4 mg total) under the tongue every 5 (five) minutes as needed. (Patient not taking: Reported on 10/03/2019) 25 tablet 3  . ondansetron (ZOFRAN) 8 MG tablet Take 8 mg by mouth every 8 (eight) hours as  needed for nausea or vomiting.    Marland Kitchen oxyCODONE (ROXICODONE) 15 MG immediate release tablet Take 1 tablet (15 mg total) by mouth every 6 (six) hours as needed. for pain 90 tablet 0  . polyethylene glycol (MIRALAX / GLYCOLAX) packet Take 17 g by mouth daily as needed for moderate constipation. (Patient not taking: Reported on 10/03/2019)    . predniSONE (DELTASONE) 5 MG tablet TAKE 1 TABLET BY MOUTH DAILY WITH BREAKFAST. 30 tablet 3  . promethazine (PHENERGAN) 25 MG tablet TAKE 1/2 TO 1 TABLET (12.5-25 MG TOTAL) BY MOUTH EVERY 8 (EIGHT) HOURS AS NEEDED FOR NAUSEA OR VOMITING. 60 tablet 3  . traZODone (DESYREL) 50 MG tablet Take 1-2 tablets (50-100 mg total) by mouth at bedtime as needed for sleep. 60 tablet 2   No current facility-administered medications for this visit.   Facility-Administered Medications Ordered in Other Visits  Medication Dose Route Frequency Provider Last Rate Last Admin  . 0.9 %  sodium chloride infusion   Intravenous Continuous [name], MD 999 mL/hr at 10/14/19 1426 New Bag at 10/14/19 1426    PHYSICAL EXAMINATION: ECOG PERFORMANCE STATUS: 1 - Symptomatic but completely ambulatory  BP 134/89 (BP Location: Left Arm, Patient Position: Sitting)   Pulse (!) 116 Comment: heart rate after resting   Temp 97.6 F (36.4 C) (Tympanic)   Resp 18   Ht 5' 11" (1.803 m)   Wt 148 lb 12.8 oz (67.5 kg)   SpO2 99% Comment: room air  BMI 20.75 kg/m   Filed Weights   10/14/19 1343  Weight: 148 lb 12.8 oz (67.5 kg)    Physical Exam Constitutional:      Comments:  Frail-appearing Caucasian male patient.  He is in a wheelchair.Marland Kitchen  He is accompanied by his wife.  HENT:     Head: Normocephalic and atraumatic.     Mouth/Throat:     Pharynx: No oropharyngeal exudate.  Eyes:     Pupils: Pupils are equal, round, and reactive to light.  Cardiovascular:     Rate and Rhythm: Normal rate and regular rhythm.  Pulmonary:     Effort: No respiratory distress.     Breath  sounds: No wheezing.  Abdominal:     General: Bowel sounds are normal. There is no distension.     Palpations: Abdomen is soft. There is no mass.     Tenderness: There is no abdominal tenderness. There is no guarding or rebound.  Musculoskeletal:        General: No tenderness. Normal range of motion.     Cervical  back: Normal range of motion and neck supple.     Comments: .  Skin:    General: Skin is warm.     Comments: Macular rash on hands  Neurological:     Mental Status: He is alert and oriented to person, place, and time.  Psychiatric:        Mood and Affect: Affect normal.     LABORATORY DATA:  I have reviewed the data as listed    Component Value Date/Time   NA 137 10/14/2019 1313   NA 140 07/13/2013 1542   K 3.7 10/14/2019 1313   K 4.3 07/13/2013 1542   CL 102 10/14/2019 1313   CL 107 07/13/2013 1542   CO2 28 10/14/2019 1313   CO2 26 07/13/2013 1542   GLUCOSE 126 (H) 10/14/2019 1313   GLUCOSE 106 (H) 07/13/2013 1542   BUN 8 10/14/2019 1313   BUN 11 07/13/2013 1542   CREATININE 0.71 10/14/2019 1313   CREATININE 0.76 10/15/2017 1001   CALCIUM 8.9 10/14/2019 1313   CALCIUM 9.9 07/13/2013 1542   PROT 5.7 (L) 10/14/2019 1313   PROT 8.1 07/13/2013 1542   ALBUMIN 3.4 (L) 10/14/2019 1313   ALBUMIN 4.3 07/13/2013 1542   AST 12 (L) 10/14/2019 1313   AST 26 07/13/2013 1542   ALT 8 10/14/2019 1313   ALT 32 07/13/2013 1542   ALKPHOS 60 10/14/2019 1313   ALKPHOS 74 07/13/2013 1542   BILITOT 0.6 10/14/2019 1313   BILITOT 0.4 07/13/2013 1542   GFRNONAA >60 10/14/2019 1313   GFRNONAA 95 10/15/2017 1001   GFRAA >60 10/03/2019 1349   GFRAA 110 10/15/2017 1001    No results found for: SPEP, UPEP  Lab Results  Component Value Date   WBC 8.7 10/14/2019   NEUTROABS 5.8 10/14/2019   HGB 12.6 (L) 10/14/2019   HCT 38.5 (L) 10/14/2019   MCV 94.1 10/14/2019   PLT 241 10/14/2019      Chemistry      Component Value Date/Time   NA 137 10/14/2019 1313   NA 140  07/13/2013 1542   K 3.7 10/14/2019 1313   K 4.3 07/13/2013 1542   CL 102 10/14/2019 1313   CL 107 07/13/2013 1542   CO2 28 10/14/2019 1313   CO2 26 07/13/2013 1542   BUN 8 10/14/2019 1313   BUN 11 07/13/2013 1542   CREATININE 0.71 10/14/2019 1313   CREATININE 0.76 10/15/2017 1001      Component Value Date/Time   CALCIUM 8.9 10/14/2019 1313   CALCIUM 9.9 07/13/2013 1542   ALKPHOS 60 10/14/2019 1313   ALKPHOS 74 07/13/2013 1542   AST 12 (L) 10/14/2019 1313   AST 26 07/13/2013 1542   ALT 8 10/14/2019 1313   ALT 32 07/13/2013 1542   BILITOT 0.6 10/14/2019 1313   BILITOT 0.4 07/13/2013 1542     RADIOGRAPHIC STUDIES: I have personally reviewed the radiological images as listed and agreed with the findings in the report. No results found.   ASSESSMENT & PLAN:  Prostate cancer metastatic to bone ([place]) # Castrate resistant prostate cancer metastases to bone. ADT;  MAY 2021- PET- Improved; however PSA- rising.  Most recently on cabazitaxel s/p #2 [80% dose]- poorly tolerated [see below]   #Patient had extreme poor tolerance to chemotherapy-worsening fatigue nausea vomiting diarrhea/worsening neuropathy; and significant decline in the quality of life.  He declines further chemotherapy which is very reasonable [see below]  # Extreme fatigue- plan IVFs/Dex 10 mg every 2 weeks or so.  Labs reviewed no significant electrolyte imbalance noted.  # Malignancy related pain- continue fentanyl patch hydrocodone;. Continue fentanyl patch to every 48 hours and continue oxycodone prn; STABLE.   # Prognosis/natural history: A long discussion with patient and wife regarding his choice between quality of life versus continued chemotherapy-especially his quality of life significant declines while on chemotherapy.  Discussed regarding discontinuation of chemotherapy with which patient is in agreement.  Discussed regarding hospice philosophy with the goal to keep him home/take care of symptoms.  We will  make a hospice referral.  Patient understands that he will not be allowed IV fluids/steroids IV while on hospice.  However patient will continue IV fluids/dexamethasone for now.  Discussed with [name].   # DISPOSITION: # hospice referral re: metastatic prostate cancer  # IVFs today; dex 19m IVP.  # 2 weeks- MD; labs- cbc/cmp;PSA-possible IVFs 1 hour; dex-Dr.B      No orders of the defined types were placed in this encounter.  All questions were answered. The patient knows to call the clinic with any problems, questions or concerns.      GCammie Sickle MD 10/14/2019 3:51 PM

## 2019-10-16 ENCOUNTER — Telehealth: Payer: Self-pay | Admitting: Internal Medicine

## 2019-10-16 DIAGNOSIS — C61 Malignant neoplasm of prostate: Secondary | ICD-10-CM | POA: Diagnosis not present

## 2019-10-16 DIAGNOSIS — G620 Drug-induced polyneuropathy: Secondary | ICD-10-CM | POA: Diagnosis not present

## 2019-10-16 DIAGNOSIS — I252 Old myocardial infarction: Secondary | ICD-10-CM | POA: Diagnosis not present

## 2019-10-16 DIAGNOSIS — C7951 Secondary malignant neoplasm of bone: Secondary | ICD-10-CM | POA: Diagnosis not present

## 2019-10-16 DIAGNOSIS — R634 Abnormal weight loss: Secondary | ICD-10-CM | POA: Diagnosis not present

## 2019-10-16 DIAGNOSIS — E785 Hyperlipidemia, unspecified: Secondary | ICD-10-CM | POA: Diagnosis not present

## 2019-10-16 DIAGNOSIS — I25119 Atherosclerotic heart disease of native coronary artery with unspecified angina pectoris: Secondary | ICD-10-CM | POA: Diagnosis not present

## 2019-10-16 DIAGNOSIS — R630 Anorexia: Secondary | ICD-10-CM | POA: Diagnosis not present

## 2019-10-16 DIAGNOSIS — I10 Essential (primary) hypertension: Secondary | ICD-10-CM | POA: Diagnosis not present

## 2019-10-16 NOTE — Telephone Encounter (Signed)
On 10/013-unable to reach the patient.  Spoke to patient's wife Jenny Reichmann regarding rising PSA-again concerning for progressive malignancy.  Given the quality of life issues on chemotherapy/poor tolerance-continue recommend hospice.  Have an appointment soon.

## 2019-10-17 DIAGNOSIS — I25119 Atherosclerotic heart disease of native coronary artery with unspecified angina pectoris: Secondary | ICD-10-CM | POA: Diagnosis not present

## 2019-10-17 DIAGNOSIS — I10 Essential (primary) hypertension: Secondary | ICD-10-CM | POA: Diagnosis not present

## 2019-10-17 DIAGNOSIS — R634 Abnormal weight loss: Secondary | ICD-10-CM | POA: Diagnosis not present

## 2019-10-17 DIAGNOSIS — I252 Old myocardial infarction: Secondary | ICD-10-CM | POA: Diagnosis not present

## 2019-10-17 DIAGNOSIS — G620 Drug-induced polyneuropathy: Secondary | ICD-10-CM | POA: Diagnosis not present

## 2019-10-17 DIAGNOSIS — C61 Malignant neoplasm of prostate: Secondary | ICD-10-CM | POA: Diagnosis not present

## 2019-10-17 DIAGNOSIS — R630 Anorexia: Secondary | ICD-10-CM | POA: Diagnosis not present

## 2019-10-17 DIAGNOSIS — C7951 Secondary malignant neoplasm of bone: Secondary | ICD-10-CM | POA: Diagnosis not present

## 2019-10-17 DIAGNOSIS — E785 Hyperlipidemia, unspecified: Secondary | ICD-10-CM | POA: Diagnosis not present

## 2019-10-18 DIAGNOSIS — C7951 Secondary malignant neoplasm of bone: Secondary | ICD-10-CM | POA: Diagnosis not present

## 2019-10-18 DIAGNOSIS — E785 Hyperlipidemia, unspecified: Secondary | ICD-10-CM | POA: Diagnosis not present

## 2019-10-18 DIAGNOSIS — I252 Old myocardial infarction: Secondary | ICD-10-CM | POA: Diagnosis not present

## 2019-10-18 DIAGNOSIS — I10 Essential (primary) hypertension: Secondary | ICD-10-CM | POA: Diagnosis not present

## 2019-10-18 DIAGNOSIS — R634 Abnormal weight loss: Secondary | ICD-10-CM | POA: Diagnosis not present

## 2019-10-18 DIAGNOSIS — I25119 Atherosclerotic heart disease of native coronary artery with unspecified angina pectoris: Secondary | ICD-10-CM | POA: Diagnosis not present

## 2019-10-18 DIAGNOSIS — C61 Malignant neoplasm of prostate: Secondary | ICD-10-CM | POA: Diagnosis not present

## 2019-10-18 DIAGNOSIS — G620 Drug-induced polyneuropathy: Secondary | ICD-10-CM | POA: Diagnosis not present

## 2019-10-18 DIAGNOSIS — R630 Anorexia: Secondary | ICD-10-CM | POA: Diagnosis not present

## 2019-10-19 DIAGNOSIS — E785 Hyperlipidemia, unspecified: Secondary | ICD-10-CM | POA: Diagnosis not present

## 2019-10-19 DIAGNOSIS — I25119 Atherosclerotic heart disease of native coronary artery with unspecified angina pectoris: Secondary | ICD-10-CM | POA: Diagnosis not present

## 2019-10-19 DIAGNOSIS — C7951 Secondary malignant neoplasm of bone: Secondary | ICD-10-CM | POA: Diagnosis not present

## 2019-10-19 DIAGNOSIS — R630 Anorexia: Secondary | ICD-10-CM | POA: Diagnosis not present

## 2019-10-19 DIAGNOSIS — I252 Old myocardial infarction: Secondary | ICD-10-CM | POA: Diagnosis not present

## 2019-10-19 DIAGNOSIS — R634 Abnormal weight loss: Secondary | ICD-10-CM | POA: Diagnosis not present

## 2019-10-19 DIAGNOSIS — I10 Essential (primary) hypertension: Secondary | ICD-10-CM | POA: Diagnosis not present

## 2019-10-19 DIAGNOSIS — G620 Drug-induced polyneuropathy: Secondary | ICD-10-CM | POA: Diagnosis not present

## 2019-10-19 DIAGNOSIS — C61 Malignant neoplasm of prostate: Secondary | ICD-10-CM | POA: Diagnosis not present

## 2019-10-20 DIAGNOSIS — C61 Malignant neoplasm of prostate: Secondary | ICD-10-CM | POA: Diagnosis not present

## 2019-10-20 DIAGNOSIS — I25119 Atherosclerotic heart disease of native coronary artery with unspecified angina pectoris: Secondary | ICD-10-CM | POA: Diagnosis not present

## 2019-10-20 DIAGNOSIS — E785 Hyperlipidemia, unspecified: Secondary | ICD-10-CM | POA: Diagnosis not present

## 2019-10-20 DIAGNOSIS — G620 Drug-induced polyneuropathy: Secondary | ICD-10-CM | POA: Diagnosis not present

## 2019-10-20 DIAGNOSIS — I252 Old myocardial infarction: Secondary | ICD-10-CM | POA: Diagnosis not present

## 2019-10-20 DIAGNOSIS — C7951 Secondary malignant neoplasm of bone: Secondary | ICD-10-CM | POA: Diagnosis not present

## 2019-10-20 DIAGNOSIS — I10 Essential (primary) hypertension: Secondary | ICD-10-CM | POA: Diagnosis not present

## 2019-10-20 DIAGNOSIS — R634 Abnormal weight loss: Secondary | ICD-10-CM | POA: Diagnosis not present

## 2019-10-20 DIAGNOSIS — R630 Anorexia: Secondary | ICD-10-CM | POA: Diagnosis not present

## 2019-10-21 DIAGNOSIS — R630 Anorexia: Secondary | ICD-10-CM | POA: Diagnosis not present

## 2019-10-21 DIAGNOSIS — E785 Hyperlipidemia, unspecified: Secondary | ICD-10-CM | POA: Diagnosis not present

## 2019-10-21 DIAGNOSIS — I25119 Atherosclerotic heart disease of native coronary artery with unspecified angina pectoris: Secondary | ICD-10-CM | POA: Diagnosis not present

## 2019-10-21 DIAGNOSIS — C7951 Secondary malignant neoplasm of bone: Secondary | ICD-10-CM | POA: Diagnosis not present

## 2019-10-21 DIAGNOSIS — C61 Malignant neoplasm of prostate: Secondary | ICD-10-CM | POA: Diagnosis not present

## 2019-10-21 DIAGNOSIS — G620 Drug-induced polyneuropathy: Secondary | ICD-10-CM | POA: Diagnosis not present

## 2019-10-21 DIAGNOSIS — I10 Essential (primary) hypertension: Secondary | ICD-10-CM | POA: Diagnosis not present

## 2019-10-21 DIAGNOSIS — I252 Old myocardial infarction: Secondary | ICD-10-CM | POA: Diagnosis not present

## 2019-10-21 DIAGNOSIS — R634 Abnormal weight loss: Secondary | ICD-10-CM | POA: Diagnosis not present

## 2019-10-22 DIAGNOSIS — R634 Abnormal weight loss: Secondary | ICD-10-CM | POA: Diagnosis not present

## 2019-10-22 DIAGNOSIS — I25119 Atherosclerotic heart disease of native coronary artery with unspecified angina pectoris: Secondary | ICD-10-CM | POA: Diagnosis not present

## 2019-10-22 DIAGNOSIS — E785 Hyperlipidemia, unspecified: Secondary | ICD-10-CM | POA: Diagnosis not present

## 2019-10-22 DIAGNOSIS — C7951 Secondary malignant neoplasm of bone: Secondary | ICD-10-CM | POA: Diagnosis not present

## 2019-10-22 DIAGNOSIS — G620 Drug-induced polyneuropathy: Secondary | ICD-10-CM | POA: Diagnosis not present

## 2019-10-22 DIAGNOSIS — C61 Malignant neoplasm of prostate: Secondary | ICD-10-CM | POA: Diagnosis not present

## 2019-10-22 DIAGNOSIS — I252 Old myocardial infarction: Secondary | ICD-10-CM | POA: Diagnosis not present

## 2019-10-22 DIAGNOSIS — I10 Essential (primary) hypertension: Secondary | ICD-10-CM | POA: Diagnosis not present

## 2019-10-22 DIAGNOSIS — R630 Anorexia: Secondary | ICD-10-CM | POA: Diagnosis not present

## 2019-10-23 ENCOUNTER — Other Ambulatory Visit: Payer: Self-pay | Admitting: Hospice and Palliative Medicine

## 2019-10-23 ENCOUNTER — Telehealth: Payer: Self-pay

## 2019-10-23 DIAGNOSIS — C61 Malignant neoplasm of prostate: Secondary | ICD-10-CM | POA: Diagnosis not present

## 2019-10-23 DIAGNOSIS — G620 Drug-induced polyneuropathy: Secondary | ICD-10-CM | POA: Diagnosis not present

## 2019-10-23 DIAGNOSIS — I10 Essential (primary) hypertension: Secondary | ICD-10-CM | POA: Diagnosis not present

## 2019-10-23 DIAGNOSIS — E785 Hyperlipidemia, unspecified: Secondary | ICD-10-CM | POA: Diagnosis not present

## 2019-10-23 DIAGNOSIS — I25119 Atherosclerotic heart disease of native coronary artery with unspecified angina pectoris: Secondary | ICD-10-CM | POA: Diagnosis not present

## 2019-10-23 DIAGNOSIS — C7951 Secondary malignant neoplasm of bone: Secondary | ICD-10-CM | POA: Diagnosis not present

## 2019-10-23 DIAGNOSIS — I252 Old myocardial infarction: Secondary | ICD-10-CM | POA: Diagnosis not present

## 2019-10-23 DIAGNOSIS — G893 Neoplasm related pain (acute) (chronic): Secondary | ICD-10-CM

## 2019-10-23 DIAGNOSIS — R630 Anorexia: Secondary | ICD-10-CM | POA: Diagnosis not present

## 2019-10-23 DIAGNOSIS — R634 Abnormal weight loss: Secondary | ICD-10-CM | POA: Diagnosis not present

## 2019-10-23 MED ORDER — OXYCODONE HCL 15 MG PO TABS: 15.0000 mg | ORAL_TABLET | ORAL | 0 refills | Status: DC | PRN

## 2019-10-23 MED ORDER — DEXAMETHASONE 4 MG PO TABS: 4.0000 mg | ORAL_TABLET | Freq: Two times a day (BID) | ORAL | 2 refills | Status: DC

## 2019-10-23 MED ORDER — FENTANYL 50 MCG/HR TD PT72
1.0000 | MEDICATED_PATCH | TRANSDERMAL | 0 refills | Status: DC
Start: 2019-10-23 — End: 2019-10-28

## 2019-10-23 MED ORDER — FENTANYL 100 MCG/HR TD PT72: 1.0000 | MEDICATED_PATCH | TRANSDERMAL | 0 refills | Status: DC

## 2019-10-23 MED ORDER — GABAPENTIN 300 MG PO CAPS: 300.0000 mg | ORAL_CAPSULE | Freq: Three times a day (TID) | ORAL | 2 refills | Status: DC

## 2019-10-23 NOTE — Telephone Encounter (Signed)
PA initiated for fentanyl patches. Waiting on response.   KEY: [id number]

## 2019-10-23 NOTE — Progress Notes (Signed)
I received a call from patient's hospice nurse, Anderson Malta.  Reportedly, patient has had worsening generalized pain this week.  Apparently his pain significantly improved with IV dexamethasone (10 mg) that was administered in the clinic but pain has subsequently worsened.  Patient is unable to sleep.  Patient is taking oxycodone 30 mg every 6 hours without significant improvement.  No other significant changes or concerns were reported.  Plan: -Increase dose of transdermal fentanyl 150 mcg (from 125 mcg) every 48 hours -Increase frequency oxycodone 15 to 30 mg every 4 hours (from every 6 hours) as needed for breakthrough pain -Can increase dose of gabapentin to 300 mg (from 100mg ) 3 times daily -Will also discontinue prednisone and start dexamethasone 4 mg p.o. twice daily

## 2019-10-24 DIAGNOSIS — I25119 Atherosclerotic heart disease of native coronary artery with unspecified angina pectoris: Secondary | ICD-10-CM | POA: Diagnosis not present

## 2019-10-24 DIAGNOSIS — I10 Essential (primary) hypertension: Secondary | ICD-10-CM | POA: Diagnosis not present

## 2019-10-24 DIAGNOSIS — E785 Hyperlipidemia, unspecified: Secondary | ICD-10-CM | POA: Diagnosis not present

## 2019-10-24 DIAGNOSIS — C61 Malignant neoplasm of prostate: Secondary | ICD-10-CM | POA: Diagnosis not present

## 2019-10-24 DIAGNOSIS — R630 Anorexia: Secondary | ICD-10-CM | POA: Diagnosis not present

## 2019-10-24 DIAGNOSIS — G620 Drug-induced polyneuropathy: Secondary | ICD-10-CM | POA: Diagnosis not present

## 2019-10-24 DIAGNOSIS — I252 Old myocardial infarction: Secondary | ICD-10-CM | POA: Diagnosis not present

## 2019-10-24 DIAGNOSIS — R634 Abnormal weight loss: Secondary | ICD-10-CM | POA: Diagnosis not present

## 2019-10-24 DIAGNOSIS — C7951 Secondary malignant neoplasm of bone: Secondary | ICD-10-CM | POA: Diagnosis not present

## 2019-10-25 DIAGNOSIS — E785 Hyperlipidemia, unspecified: Secondary | ICD-10-CM | POA: Diagnosis not present

## 2019-10-25 DIAGNOSIS — G620 Drug-induced polyneuropathy: Secondary | ICD-10-CM | POA: Diagnosis not present

## 2019-10-25 DIAGNOSIS — C7951 Secondary malignant neoplasm of bone: Secondary | ICD-10-CM | POA: Diagnosis not present

## 2019-10-25 DIAGNOSIS — R630 Anorexia: Secondary | ICD-10-CM | POA: Diagnosis not present

## 2019-10-25 DIAGNOSIS — C61 Malignant neoplasm of prostate: Secondary | ICD-10-CM | POA: Diagnosis not present

## 2019-10-25 DIAGNOSIS — I252 Old myocardial infarction: Secondary | ICD-10-CM | POA: Diagnosis not present

## 2019-10-25 DIAGNOSIS — I25119 Atherosclerotic heart disease of native coronary artery with unspecified angina pectoris: Secondary | ICD-10-CM | POA: Diagnosis not present

## 2019-10-25 DIAGNOSIS — R634 Abnormal weight loss: Secondary | ICD-10-CM | POA: Diagnosis not present

## 2019-10-25 DIAGNOSIS — I10 Essential (primary) hypertension: Secondary | ICD-10-CM | POA: Diagnosis not present

## 2019-10-26 DIAGNOSIS — E785 Hyperlipidemia, unspecified: Secondary | ICD-10-CM | POA: Diagnosis not present

## 2019-10-26 DIAGNOSIS — I25119 Atherosclerotic heart disease of native coronary artery with unspecified angina pectoris: Secondary | ICD-10-CM | POA: Diagnosis not present

## 2019-10-26 DIAGNOSIS — G620 Drug-induced polyneuropathy: Secondary | ICD-10-CM | POA: Diagnosis not present

## 2019-10-26 DIAGNOSIS — R630 Anorexia: Secondary | ICD-10-CM | POA: Diagnosis not present

## 2019-10-26 DIAGNOSIS — C61 Malignant neoplasm of prostate: Secondary | ICD-10-CM | POA: Diagnosis not present

## 2019-10-26 DIAGNOSIS — C7951 Secondary malignant neoplasm of bone: Secondary | ICD-10-CM | POA: Diagnosis not present

## 2019-10-26 DIAGNOSIS — R634 Abnormal weight loss: Secondary | ICD-10-CM | POA: Diagnosis not present

## 2019-10-26 DIAGNOSIS — I10 Essential (primary) hypertension: Secondary | ICD-10-CM | POA: Diagnosis not present

## 2019-10-26 DIAGNOSIS — I252 Old myocardial infarction: Secondary | ICD-10-CM | POA: Diagnosis not present

## 2019-10-27 DIAGNOSIS — E785 Hyperlipidemia, unspecified: Secondary | ICD-10-CM | POA: Diagnosis not present

## 2019-10-27 DIAGNOSIS — I10 Essential (primary) hypertension: Secondary | ICD-10-CM | POA: Diagnosis not present

## 2019-10-27 DIAGNOSIS — I25119 Atherosclerotic heart disease of native coronary artery with unspecified angina pectoris: Secondary | ICD-10-CM | POA: Diagnosis not present

## 2019-10-27 DIAGNOSIS — C7951 Secondary malignant neoplasm of bone: Secondary | ICD-10-CM | POA: Diagnosis not present

## 2019-10-27 DIAGNOSIS — R634 Abnormal weight loss: Secondary | ICD-10-CM | POA: Diagnosis not present

## 2019-10-27 DIAGNOSIS — C61 Malignant neoplasm of prostate: Secondary | ICD-10-CM | POA: Diagnosis not present

## 2019-10-27 DIAGNOSIS — R630 Anorexia: Secondary | ICD-10-CM | POA: Diagnosis not present

## 2019-10-27 DIAGNOSIS — I252 Old myocardial infarction: Secondary | ICD-10-CM | POA: Diagnosis not present

## 2019-10-27 DIAGNOSIS — G620 Drug-induced polyneuropathy: Secondary | ICD-10-CM | POA: Diagnosis not present

## 2019-10-28 ENCOUNTER — Inpatient Hospital Stay (HOSPITAL_BASED_OUTPATIENT_CLINIC_OR_DEPARTMENT_OTHER): Payer: 59 | Admitting: Internal Medicine

## 2019-10-28 ENCOUNTER — Inpatient Hospital Stay: Payer: 59

## 2019-10-28 ENCOUNTER — Other Ambulatory Visit: Payer: Self-pay | Admitting: Hospice and Palliative Medicine

## 2019-10-28 ENCOUNTER — Encounter: Payer: Self-pay | Admitting: Internal Medicine

## 2019-10-28 ENCOUNTER — Other Ambulatory Visit: Payer: Self-pay

## 2019-10-28 DIAGNOSIS — Z87891 Personal history of nicotine dependence: Secondary | ICD-10-CM | POA: Diagnosis not present

## 2019-10-28 DIAGNOSIS — R634 Abnormal weight loss: Secondary | ICD-10-CM | POA: Diagnosis not present

## 2019-10-28 DIAGNOSIS — I25119 Atherosclerotic heart disease of native coronary artery with unspecified angina pectoris: Secondary | ICD-10-CM | POA: Diagnosis not present

## 2019-10-28 DIAGNOSIS — G893 Neoplasm related pain (acute) (chronic): Secondary | ICD-10-CM | POA: Diagnosis not present

## 2019-10-28 DIAGNOSIS — I10 Essential (primary) hypertension: Secondary | ICD-10-CM | POA: Diagnosis not present

## 2019-10-28 DIAGNOSIS — C61 Malignant neoplasm of prostate: Secondary | ICD-10-CM

## 2019-10-28 DIAGNOSIS — C7951 Secondary malignant neoplasm of bone: Secondary | ICD-10-CM

## 2019-10-28 DIAGNOSIS — G620 Drug-induced polyneuropathy: Secondary | ICD-10-CM | POA: Diagnosis not present

## 2019-10-28 DIAGNOSIS — I25118 Atherosclerotic heart disease of native coronary artery with other forms of angina pectoris: Secondary | ICD-10-CM

## 2019-10-28 DIAGNOSIS — T451X5A Adverse effect of antineoplastic and immunosuppressive drugs, initial encounter: Secondary | ICD-10-CM | POA: Diagnosis not present

## 2019-10-28 DIAGNOSIS — R112 Nausea with vomiting, unspecified: Secondary | ICD-10-CM | POA: Diagnosis not present

## 2019-10-28 DIAGNOSIS — I252 Old myocardial infarction: Secondary | ICD-10-CM | POA: Diagnosis not present

## 2019-10-28 DIAGNOSIS — E785 Hyperlipidemia, unspecified: Secondary | ICD-10-CM | POA: Diagnosis not present

## 2019-10-28 DIAGNOSIS — K1231 Oral mucositis (ulcerative) due to antineoplastic therapy: Secondary | ICD-10-CM | POA: Diagnosis not present

## 2019-10-28 DIAGNOSIS — R630 Anorexia: Secondary | ICD-10-CM | POA: Diagnosis not present

## 2019-10-28 DIAGNOSIS — F329 Major depressive disorder, single episode, unspecified: Secondary | ICD-10-CM | POA: Diagnosis not present

## 2019-10-28 LAB — COMPREHENSIVE METABOLIC PANEL
ALT: 25 U/L (ref 0–44)
AST: 31 U/L (ref 15–41)
Albumin: 3.7 g/dL (ref 3.5–5.0)
Alkaline Phosphatase: 46 U/L (ref 38–126)
Anion gap: 8 (ref 5–15)
BUN: 22 mg/dL (ref 8–23)
CO2: 24 mmol/L (ref 22–32)
Calcium: 9.3 mg/dL (ref 8.9–10.3)
Chloride: 100 mmol/L (ref 98–111)
Creatinine, Ser: 0.69 mg/dL (ref 0.61–1.24)
GFR, Estimated: >60 mL/min (ref >60)
Glucose, Bld: 141 mg/dL — ABNORMAL HIGH (ref 70–99)
Potassium: 4.6 mmol/L (ref 3.5–5.1)
Sodium: 132 mmol/L — ABNORMAL LOW (ref 135–145)
Total Bilirubin: 0.4 mg/dL (ref 0.3–1.2)
Total Protein: 6.3 g/dL — ABNORMAL LOW (ref 6.5–8.1)

## 2019-10-28 LAB — CBC WITH DIFFERENTIAL/PLATELET
Abs Immature Granulocytes: 0.12 10*3/uL — ABNORMAL HIGH (ref 0.00–0.07)
Basophils Absolute: 0 10*3/uL (ref 0.0–0.1)
Basophils Relative: 0 %
Eosinophils Absolute: 0 10*3/uL (ref 0.0–0.5)
Eosinophils Relative: 0 %
HCT: 37.4 % — ABNORMAL LOW (ref 39.0–52.0)
Hemoglobin: 12.4 g/dL — ABNORMAL LOW (ref 13.0–17.0)
Immature Granulocytes: 1 %
Lymphocytes Relative: 9 %
Lymphs Abs: 0.8 10*3/uL (ref 0.7–4.0)
MCH: 30.5 pg (ref 26.0–34.0)
MCHC: 33.2 g/dL (ref 30.0–36.0)
MCV: 92.1 fL (ref 80.0–100.0)
Monocytes Absolute: 0.5 10*3/uL (ref 0.1–1.0)
Monocytes Relative: 6 %
Neutro Abs: 8.2 10*3/uL — ABNORMAL HIGH (ref 1.7–7.7)
Neutrophils Relative %: 84 %
Platelets: 329 10*3/uL (ref 150–400)
RBC: 4.06 MIL/uL — ABNORMAL LOW (ref 4.22–5.81)
RDW: 15.4 % (ref 11.5–15.5)
WBC: 9.7 10*3/uL (ref 4.0–10.5)
nRBC: 0 % (ref 0.0–0.2)

## 2019-10-28 LAB — PSA: Prostatic Specific Antigen: 731 ng/mL — ABNORMAL HIGH (ref 0.00–4.00)

## 2019-10-28 MED ORDER — FENTANYL 100 MCG/HR TD PT72: 1.0000 | MEDICATED_PATCH | TRANSDERMAL | 0 refills | Status: DC

## 2019-10-28 MED ORDER — FENTANYL 50 MCG/HR TD PT72: 1.0000 | MEDICATED_PATCH | TRANSDERMAL | 0 refills | Status: DC

## 2019-10-28 NOTE — Progress Notes (Signed)
[facility] OFFICE PROGRESS NOTE  Patient Care Team: Olin Hauser, [name], MD as PCP - Cardiology (Cardiology) Minna Merritts, [name], [name]  Cancer Staging No matching staging information was found for the patient.   Oncology History Overview Note  # 2011- PROSTATE CANCER [Gleason 3+4]; s/p Prostatectomy [ also involved bladder neck/ECP; Dr.Polaseck]; July 2014- Biochemical recurrence [PSA 14]- started on Zoladex [Dr.Pandit]; lost to follow up.  # JAN 2017- STAGE IV METASTATIC PROSTATE Cancer to Bone- Feb 13th, 2017-  Lupron q 65m[~end of feb]; PSA: 1021; Declined Chemo; April 2017 [xofigo x6; Dr.Crystal]; AUG 2017- Zytiga + Prednisone BID. Bone scan-Jan 2018- improved skeletal metastases.   # MAY 1st 2019- START X-tandi [stopped zytiga/PSA- 8.8/rising]  #August 23, 2018-stop Xtandi [intolerance-fatigue mental fogginess]  #Mid September 2020-apalutamide; stopped mid-October poor tolerance/progression  # 11/01/2018- -Taxotere weekly [consent]; July 2021-progression of disease/PSA   # 08/13/2019-cabazitaxel; Neulasta  # Mets to bone- start X-geva q 65M [May 30th]; hold dental issues/infections  # Smoker/ chronic pain/pain clinic   #[April 2018; [place]]- s/p stenting; on Plavix; --------------------------------------------------------------  DIAGNOSIS: _0  Castrate resistant prostate cancer  STAGE:   4    ;GOALS: Palliative  CURRENT/MOST RECENT THERAPY-cabazitaxel    Prostate cancer metastatic to bone ([facility]  12/08/2014 Initial Diagnosis   [place] bone ([facility]   11/01/2018 - 07/09/2019 Chemotherapy   The patient had DOCEtaxel (TAXOTERE) 70 mg in sodium chloride 0.9 % 150 mL chemo infusion, 36 mg/m2 = 70 mg, Intravenous,  Once, 19 of 21 cycles Administration: 70 mg (11/08/2018),  70 mg (11/22/2018), 70 mg (12/13/2018), 70 mg (12/06/2018), 70 mg (12/31/2018), 70 mg (01/07/2019), 70 mg (01/29/2019), 70 mg (12/20/2018), 70 mg (02/06/2019), 70 mg (02/14/2019), 70 mg (02/28/2019), 70 mg (03/14/2019), 70 mg (03/28/2019), 70 mg (04/15/2019), 70 mg (04/29/2019), 70 mg (05/27/2019), 70 mg (06/24/2019), 70 mg (07/09/2019)  for chemotherapy treatment.    08/13/2019 -  Chemotherapy   The patient had pegfilgrastim (NEULASTA) injection 6 mg, 6 mg, Subcutaneous, Once, 3 of 6 cycles Administration: 6 mg (08/14/2019), 6 mg (09/19/2019) cabazitaxel (JEVTANA) 38 mg in sodium chloride 0.9 % 250 mL chemo infusion, 20 mg/m2 = 38 mg, Intravenous,  Once, 3 of 6 cycles Dose modification: 16 mg/m2 (original dose 20 mg/m2, Cycle 3, Reason: Provider Judgment) Administration: 38 mg (08/13/2019), 30 mg (09/18/2019)  for chemotherapy treatment.     INTERVAL HISTORY:  Robert HALLENBECK89y.o.  male pleasant patient above history of metastatic castrate resistant prostate cancer most recently on cabazitaxel is here for follow-up.  Patient cabazitaxel is currently on hold because of poor tolerance to therapy.  In the interim patient met with hospice; enrolled in the services.  Patient currently on dexamethasone 4 mg twice daily notes to have significant improvement of his energy levels given that he is tired.  Pain is improved.-However continues to use long-term fentanyl patches; also uses breakthrough pain medication.  Denies any swelling in the legs.  Chronic shortness of breath.  Review of Systems  Constitutional: Positive for malaise/fatigue and weight loss. Negative for chills, diaphoresis and fever.  HENT: Negative for nosebleeds and sore throat.   Eyes: Negative for double vision.  Respiratory: Positive for shortness of breath. Negative for cough, hemoptysis, sputum production and wheezing.   Cardiovascular: Negative for chest pain, palpitations and orthopnea.  Gastrointestinal: Positive for nausea. Negative  for  abdominal pain, blood in stool, diarrhea, heartburn, melena and vomiting.  Genitourinary: Negative for dysuria, frequency and urgency.  Musculoskeletal: Positive for back pain and joint pain.  Skin: Negative for itching.  Neurological: Negative for dizziness, tingling, focal weakness, weakness and headaches.  Endo/Heme/Allergies: Does not bruise/bleed easily.  Psychiatric/Behavioral: Negative for depression. The patient is not nervous/anxious and does not have insomnia.       PAST MEDICAL HISTORY :  Past Medical History:  Diagnosis Date  . Back pain 10/09/2012  . Bone cancer ([place])   . CAD (coronary artery disease)    a. 08/2015 PCI: LM nl, LAD 20d, LCX nl, RCA 19m RPAV 95 (3.0x18 Xience Alpine DES);  b. 04/2016 PCI ([facility]: RPL 95 (2.75x8 Promus Premier DES).  . Cancer associated pain   . Depression   . History of echocardiogram    a. 08/2016 Echo: EF 50-55%.  .Marland KitchenHistory of kidney stones   . Hyperlipidemia   . Hypertension   . Joint pain   . Prostate cancer ([facility]    a.  s/p prostatectomy ([facility]);  b. bone mets noted 04/2016.  . Right arm pain 01/10/2016  . Right foot pain 01/10/2016  . Right leg pain 01/10/2016  . Tobacco abuse     PAST SURGICAL HISTORY :   Past Surgical History:  Procedure Laterality Date  . CARDIAC CATHETERIZATION     [facility]  . CARDIAC CATHETERIZATION N/A 08/16/2015   Procedure: Left Heart Cath and Coronary Angiography;  Surgeon: DYolonda Kida MD;  [facility];  Service: Cardiovascular;  Laterality: N/A;  . CARDIAC CATHETERIZATION N/A 08/16/2015   Procedure: Coronary Stent Intervention;  Surgeon: DYolonda Kida MD;  [facility];  Service: Cardiovascular;  Laterality: N/A;  . CERVIX SURGERY    . PROSTATECTOMY    . SPINE SURGERY    . TONSILLECTOMY    . WRIST SURGERY      FAMILY HISTORY :   Family History  Problem Relation Age of Onset  . Heart attack Mother   . Hypertension Mother   . Heart attack Father      SOCIAL HISTORY:   Social History   Tobacco Use  . Smoking status: Former Smoker    Packs/day: 0.50    Years: 40.00    Pack years: 20.00    Types: Cigarettes  . Smokeless tobacco: Current User    Types: Chew  . Tobacco comment: 1 pack every couple days   Vaping Use  . Vaping Use: Never used  Substance Use Topics  . Alcohol use: Yes    Comment: occasionally  . Drug use: No    ALLERGIES:  is allergic to ditropan [oxybutynin].  MEDICATIONS:  Current Outpatient Medications  Medication Sig Dispense Refill  . acetaminophen (TYLENOL) 500 MG tablet Take 1,000 mg by mouth every 8 (eight) hours as needed.     .Marland Kitchenalbuterol (PROVENTIL HFA;VENTOLIN HFA) 108 (90 Base) MCG/ACT inhaler Inhale 1-2 puffs into the lungs every 6 (six) hours as needed for wheezing or shortness of breath. 1 Inhaler 0  . aspirin 81 MG tablet Take 81 mg by mouth daily.    .Marland Kitchenatorvastatin (LIPITOR) 80 MG tablet Take 1 tablet (80 mg total) by mouth daily at 6 PM. 90 tablet 3  . dexamethasone (DECADRON) 4 MG tablet Take 1 tablet (4 mg total) by mouth 2 (two) times daily with a meal. 30 tablet 2  . gabapentin (NEURONTIN) 300 MG capsule Take 1 capsule (  300 mg total) by mouth 3 (three) times daily. 90 capsule 2  . magic mouthwash SOLN Take 5 mLs by mouth 4 (four) times daily. 240 mL 0  . metoprolol tartrate (LOPRESSOR) 25 MG tablet TAKE 1/2 TABLET BY MOUTH 2 TIMES DAILY. 90 tablet 1  . ondansetron (ZOFRAN) 8 MG tablet Take 8 mg by mouth every 8 (eight) hours as needed for nausea or vomiting.    Marland Kitchen oxyCODONE (ROXICODONE) 15 MG immediate release tablet Take 1-2 tablets (15-30 mg total) by mouth every 4 (four) hours as needed. for pain 90 tablet 0  . polyethylene glycol (MIRALAX / GLYCOLAX) packet Take 17 g by mouth daily as needed for moderate constipation.     . promethazine (PHENERGAN) 25 MG tablet TAKE 1/2 TO 1 TABLET (12.5-25 MG TOTAL) BY MOUTH EVERY 8 (EIGHT) HOURS AS NEEDED FOR NAUSEA OR VOMITING. 60 tablet 3  .  traZODone (DESYREL) 50 MG tablet Take 1-2 tablets (50-100 mg total) by mouth at bedtime as needed for sleep. 60 tablet 2  . clopidogrel (PLAVIX) 75 MG tablet Take 1 tablet (75 mg total) by mouth daily. (Patient not taking: Reported on 10/28/2019) 90 tablet 3  . fentaNYL (DURAGESIC) 100 MCG/HR Place 1 patch onto the skin every other day. Along with 61mg patch for total of 150 mcg every 2 days. 15 patch 0  . fentaNYL (DURAGESIC) 50 MCG/HR Place 1 patch onto the skin every other day. Along with 1063m patch for total of 150 mcg every 2 days. 15 patch 0  . naloxone (NARCAN) nasal spray 4 mg/0.1 mL 1 spray into nostril upon signs of opioid overdose. Call 911. May repeat once if no response within 2-3 minutes. (Patient not taking: Reported on 10/03/2019) 1 kit 0  . nitroGLYCERIN (NITROSTAT) 0.4 MG SL tablet Place 1 tablet (0.4 mg total) under the tongue every 5 (five) minutes as needed. (Patient not taking: Reported on 10/03/2019) 25 tablet 3  . prednisoLONE (ORAPRED) 15 MG/5ML solution Take by mouth. (Patient not taking: Reported on 10/28/2019)     No current facility-administered medications for this visit.    PHYSICAL EXAMINATION: ECOG PERFORMANCE STATUS: 1 - Symptomatic but completely ambulatory  BP 112/84 (BP Location: Right Arm, Patient Position: Sitting)   Pulse 66   Temp (!) 96.7 F (35.9 C) (Tympanic)   Resp 18   Wt 144 lb 12.8 oz (65.7 kg)   SpO2 97%   BMI 20.20 kg/m   Filed Weights   10/28/19 1333  Weight: 144 lb 12.8 oz (65.7 kg)    Physical Exam Constitutional:      Comments:  Frail-appearing Caucasian male patient.  He is in a wheelchair.. Marland KitchenHe is accompanied by his wife.  HENT:     Head: Normocephalic and atraumatic.     Mouth/Throat:     Pharynx: No oropharyngeal exudate.  Eyes:     Pupils: Pupils are equal, round, and reactive to light.  Cardiovascular:     Rate and Rhythm: Normal rate and regular rhythm.  Pulmonary:     Effort: No respiratory distress.     Breath  sounds: No wheezing.  Abdominal:     General: Bowel sounds are normal. There is no distension.     Palpations: Abdomen is soft. There is no mass.     Tenderness: There is no abdominal tenderness. There is no guarding or rebound.  Musculoskeletal:        General: No tenderness. Normal range of motion.     Cervical back:  Normal range of motion and neck supple.     Comments: .  Skin:    General: Skin is warm.     Comments: Macular rash on hands  Neurological:     Mental Status: He is alert and oriented to person, place, and time.  Psychiatric:        Mood and Affect: Affect normal.     LABORATORY DATA:  I have reviewed the data as listed    Component Value Date/Time   NA 132 (L) 10/28/2019 1305   NA 140 07/13/2013 1542   K 4.6 10/28/2019 1305   K 4.3 07/13/2013 1542   CL 100 10/28/2019 1305   CL 107 07/13/2013 1542   CO2 24 10/28/2019 1305   CO2 26 07/13/2013 1542   GLUCOSE 141 (H) 10/28/2019 1305   GLUCOSE 106 (H) 07/13/2013 1542   BUN 22 10/28/2019 1305   BUN 11 07/13/2013 1542   CREATININE 0.69 10/28/2019 1305   CREATININE 0.76 10/15/2017 1001   CALCIUM 9.3 10/28/2019 1305   CALCIUM 9.9 07/13/2013 1542   PROT 6.3 (L) 10/28/2019 1305   PROT 8.1 07/13/2013 1542   ALBUMIN 3.7 10/28/2019 1305   ALBUMIN 4.3 07/13/2013 1542   AST 31 10/28/2019 1305   AST 26 07/13/2013 1542   ALT 25 10/28/2019 1305   ALT 32 07/13/2013 1542   ALKPHOS 46 10/28/2019 1305   ALKPHOS 74 07/13/2013 1542   BILITOT 0.4 10/28/2019 1305   BILITOT 0.4 07/13/2013 1542   GFRNONAA >60 10/28/2019 1305   GFRNONAA 95 10/15/2017 1001   GFRAA >60 10/03/2019 1349   GFRAA 110 10/15/2017 1001    No results found for: SPEP, UPEP  Lab Results  Component Value Date   WBC 9.7 10/28/2019   NEUTROABS 8.2 (H) 10/28/2019   HGB 12.4 (L) 10/28/2019   HCT 37.4 (L) 10/28/2019   MCV 92.1 10/28/2019   PLT 329 10/28/2019      Chemistry      Component Value Date/Time   NA 132 (L) 10/28/2019 1305   NA 140  07/13/2013 1542   K 4.6 10/28/2019 1305   K 4.3 07/13/2013 1542   CL 100 10/28/2019 1305   CL 107 07/13/2013 1542   CO2 24 10/28/2019 1305   CO2 26 07/13/2013 1542   BUN 22 10/28/2019 1305   BUN 11 07/13/2013 1542   CREATININE 0.69 10/28/2019 1305   CREATININE 0.76 10/15/2017 1001      Component Value Date/Time   CALCIUM 9.3 10/28/2019 1305   CALCIUM 9.9 07/13/2013 1542   ALKPHOS 46 10/28/2019 1305   ALKPHOS 74 07/13/2013 1542   AST 31 10/28/2019 1305   AST 26 07/13/2013 1542   ALT 25 10/28/2019 1305   ALT 32 07/13/2013 1542   BILITOT 0.4 10/28/2019 1305   BILITOT 0.4 07/13/2013 1542     RADIOGRAPHIC STUDIES: I have personally reviewed the radiological images as listed and agreed with the findings in the report. No results found.   ASSESSMENT & PLAN:  Prostate cancer metastatic to bone ([place]) # Castrate resistant prostate cancer metastases to bone. ADT;  MAY 2021- PET- Improved; however PSA- rising.  Most recently on cabazitaxel s/p #2 [80% dose]- poorly tolerated [see below];  # DISCONTINUED chemo sec to pt's poor tolerance/ pt preference.  Patient is currently in hospice.  #Fatigue -improved; overall stable; hold IV fluids and dexamethasone.  Recommend oral dexamethasone 2 mg p.o. twice a day [taper]  # Malignancy related pain- continue fentanyl patch hydrocodone;. Continue  fentanyl patch to every 48 hours and continue oxycodone prn; STABLE.  Refilled fentanyl 100 mcg.  *Patient preference/follow-up # DISPOSITION: # HOLD IVFs # 4 weeks- MD; labs- cbc/cmp;PSA-Dr.B      No orders of the defined types were placed in this encounter.  All questions were answered. The patient knows to call the clinic with any problems, questions or concerns.      [name], MD 10/28/2019 2:18 PM

## 2019-10-28 NOTE — Patient Instructions (Signed)
#   Take dexamethasone 2mg  [half a pill] twice a day.

## 2019-10-28 NOTE — Addendum Note (Signed)
Addended by: Delice Bison E on: 10/28/2019 03:27 PM   Modules accepted: Orders

## 2019-10-28 NOTE — Progress Notes (Signed)
Pt in for follow up, reports doing better than he was the last time in clinic.

## 2019-10-28 NOTE — Assessment & Plan Note (Addendum)
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 ADT;  MAY 2021- PET- Improved; however PSA- rising.  Most recently on cabazitaxel s/p #2 [80% dose]- poorly tolerated [see below];  # DISCONTINUED chemo sec to pt's poor tolerance/ pt preference.  Patient is currently in hospice.  #Fatigue -improved; overall stable; hold IV fluids and dexamethasone.  Recommend oral dexamethasone 2 mg p.o. twice a day [taper]  # Malignancy related pain- continue fentanyl patch hydrocodone;. Continue fentanyl patch to every 48 hours and continue oxycodone prn; STABLE.  Refilled fentanyl 100 mcg.  *Patient preference/follow-up # DISPOSITION: # HOLD IVFs # 4 weeks- MD; labs- cbc/cmp;PSA-Dr.B

## 2019-10-29 DIAGNOSIS — I25119 Atherosclerotic heart disease of native coronary artery with unspecified angina pectoris: Secondary | ICD-10-CM | POA: Diagnosis not present

## 2019-10-29 DIAGNOSIS — G620 Drug-induced polyneuropathy: Secondary | ICD-10-CM | POA: Diagnosis not present

## 2019-10-29 DIAGNOSIS — C61 Malignant neoplasm of prostate: Secondary | ICD-10-CM | POA: Diagnosis not present

## 2019-10-29 DIAGNOSIS — I252 Old myocardial infarction: Secondary | ICD-10-CM | POA: Diagnosis not present

## 2019-10-29 DIAGNOSIS — R630 Anorexia: Secondary | ICD-10-CM | POA: Diagnosis not present

## 2019-10-29 DIAGNOSIS — I10 Essential (primary) hypertension: Secondary | ICD-10-CM | POA: Diagnosis not present

## 2019-10-29 DIAGNOSIS — C7951 Secondary malignant neoplasm of bone: Secondary | ICD-10-CM | POA: Diagnosis not present

## 2019-10-29 DIAGNOSIS — R634 Abnormal weight loss: Secondary | ICD-10-CM | POA: Diagnosis not present

## 2019-10-29 DIAGNOSIS — E785 Hyperlipidemia, unspecified: Secondary | ICD-10-CM | POA: Diagnosis not present

## 2019-10-30 ENCOUNTER — Telehealth: Payer: Self-pay | Admitting: Internal Medicine

## 2019-10-30 DIAGNOSIS — C61 Malignant neoplasm of prostate: Secondary | ICD-10-CM | POA: Diagnosis not present

## 2019-10-30 DIAGNOSIS — R630 Anorexia: Secondary | ICD-10-CM | POA: Diagnosis not present

## 2019-10-30 DIAGNOSIS — E785 Hyperlipidemia, unspecified: Secondary | ICD-10-CM | POA: Diagnosis not present

## 2019-10-30 DIAGNOSIS — I252 Old myocardial infarction: Secondary | ICD-10-CM | POA: Diagnosis not present

## 2019-10-30 DIAGNOSIS — C7951 Secondary malignant neoplasm of bone: Secondary | ICD-10-CM | POA: Diagnosis not present

## 2019-10-30 DIAGNOSIS — I25119 Atherosclerotic heart disease of native coronary artery with unspecified angina pectoris: Secondary | ICD-10-CM | POA: Diagnosis not present

## 2019-10-30 DIAGNOSIS — R634 Abnormal weight loss: Secondary | ICD-10-CM | POA: Diagnosis not present

## 2019-10-30 DIAGNOSIS — G620 Drug-induced polyneuropathy: Secondary | ICD-10-CM | POA: Diagnosis not present

## 2019-10-30 DIAGNOSIS — I10 Essential (primary) hypertension: Secondary | ICD-10-CM | POA: Diagnosis not present

## 2019-10-30 NOTE — Telephone Encounter (Signed)
Spoke to pt that his PSA consistently going up as expected. Continue to hold off any treatmnet options. continue hospice care for now.

## 2019-10-31 DIAGNOSIS — I252 Old myocardial infarction: Secondary | ICD-10-CM | POA: Diagnosis not present

## 2019-10-31 DIAGNOSIS — R634 Abnormal weight loss: Secondary | ICD-10-CM | POA: Diagnosis not present

## 2019-10-31 DIAGNOSIS — G620 Drug-induced polyneuropathy: Secondary | ICD-10-CM | POA: Diagnosis not present

## 2019-10-31 DIAGNOSIS — C61 Malignant neoplasm of prostate: Secondary | ICD-10-CM | POA: Diagnosis not present

## 2019-10-31 DIAGNOSIS — R630 Anorexia: Secondary | ICD-10-CM | POA: Diagnosis not present

## 2019-10-31 DIAGNOSIS — C7951 Secondary malignant neoplasm of bone: Secondary | ICD-10-CM | POA: Diagnosis not present

## 2019-10-31 DIAGNOSIS — I25119 Atherosclerotic heart disease of native coronary artery with unspecified angina pectoris: Secondary | ICD-10-CM | POA: Diagnosis not present

## 2019-10-31 DIAGNOSIS — I10 Essential (primary) hypertension: Secondary | ICD-10-CM | POA: Diagnosis not present

## 2019-10-31 DIAGNOSIS — E785 Hyperlipidemia, unspecified: Secondary | ICD-10-CM | POA: Diagnosis not present

## 2019-11-01 DIAGNOSIS — R634 Abnormal weight loss: Secondary | ICD-10-CM | POA: Diagnosis not present

## 2019-11-01 DIAGNOSIS — C61 Malignant neoplasm of prostate: Secondary | ICD-10-CM | POA: Diagnosis not present

## 2019-11-01 DIAGNOSIS — R630 Anorexia: Secondary | ICD-10-CM | POA: Diagnosis not present

## 2019-11-01 DIAGNOSIS — G620 Drug-induced polyneuropathy: Secondary | ICD-10-CM | POA: Diagnosis not present

## 2019-11-01 DIAGNOSIS — I252 Old myocardial infarction: Secondary | ICD-10-CM | POA: Diagnosis not present

## 2019-11-01 DIAGNOSIS — I25119 Atherosclerotic heart disease of native coronary artery with unspecified angina pectoris: Secondary | ICD-10-CM | POA: Diagnosis not present

## 2019-11-01 DIAGNOSIS — I10 Essential (primary) hypertension: Secondary | ICD-10-CM | POA: Diagnosis not present

## 2019-11-01 DIAGNOSIS — E785 Hyperlipidemia, unspecified: Secondary | ICD-10-CM | POA: Diagnosis not present

## 2019-11-01 DIAGNOSIS — C7951 Secondary malignant neoplasm of bone: Secondary | ICD-10-CM | POA: Diagnosis not present

## 2019-11-02 DIAGNOSIS — R630 Anorexia: Secondary | ICD-10-CM | POA: Diagnosis not present

## 2019-11-02 DIAGNOSIS — C61 Malignant neoplasm of prostate: Secondary | ICD-10-CM | POA: Diagnosis not present

## 2019-11-02 DIAGNOSIS — E785 Hyperlipidemia, unspecified: Secondary | ICD-10-CM | POA: Diagnosis not present

## 2019-11-02 DIAGNOSIS — I252 Old myocardial infarction: Secondary | ICD-10-CM | POA: Diagnosis not present

## 2019-11-02 DIAGNOSIS — I25119 Atherosclerotic heart disease of native coronary artery with unspecified angina pectoris: Secondary | ICD-10-CM | POA: Diagnosis not present

## 2019-11-02 DIAGNOSIS — I10 Essential (primary) hypertension: Secondary | ICD-10-CM | POA: Diagnosis not present

## 2019-11-02 DIAGNOSIS — C7951 Secondary malignant neoplasm of bone: Secondary | ICD-10-CM | POA: Diagnosis not present

## 2019-11-02 DIAGNOSIS — R634 Abnormal weight loss: Secondary | ICD-10-CM | POA: Diagnosis not present

## 2019-11-02 DIAGNOSIS — G620 Drug-induced polyneuropathy: Secondary | ICD-10-CM | POA: Diagnosis not present

## 2019-11-03 DIAGNOSIS — E785 Hyperlipidemia, unspecified: Secondary | ICD-10-CM | POA: Diagnosis not present

## 2019-11-03 DIAGNOSIS — I252 Old myocardial infarction: Secondary | ICD-10-CM | POA: Diagnosis not present

## 2019-11-03 DIAGNOSIS — R634 Abnormal weight loss: Secondary | ICD-10-CM | POA: Diagnosis not present

## 2019-11-03 DIAGNOSIS — I10 Essential (primary) hypertension: Secondary | ICD-10-CM | POA: Diagnosis not present

## 2019-11-03 DIAGNOSIS — C7951 Secondary malignant neoplasm of bone: Secondary | ICD-10-CM | POA: Diagnosis not present

## 2019-11-03 DIAGNOSIS — C61 Malignant neoplasm of prostate: Secondary | ICD-10-CM | POA: Diagnosis not present

## 2019-11-03 DIAGNOSIS — R630 Anorexia: Secondary | ICD-10-CM | POA: Diagnosis not present

## 2019-11-03 DIAGNOSIS — I25119 Atherosclerotic heart disease of native coronary artery with unspecified angina pectoris: Secondary | ICD-10-CM | POA: Diagnosis not present

## 2019-11-03 DIAGNOSIS — G620 Drug-induced polyneuropathy: Secondary | ICD-10-CM | POA: Diagnosis not present

## 2019-11-04 DIAGNOSIS — C7951 Secondary malignant neoplasm of bone: Secondary | ICD-10-CM | POA: Diagnosis not present

## 2019-11-04 DIAGNOSIS — R630 Anorexia: Secondary | ICD-10-CM | POA: Diagnosis not present

## 2019-11-04 DIAGNOSIS — I25119 Atherosclerotic heart disease of native coronary artery with unspecified angina pectoris: Secondary | ICD-10-CM | POA: Diagnosis not present

## 2019-11-04 DIAGNOSIS — R634 Abnormal weight loss: Secondary | ICD-10-CM | POA: Diagnosis not present

## 2019-11-04 DIAGNOSIS — I10 Essential (primary) hypertension: Secondary | ICD-10-CM | POA: Diagnosis not present

## 2019-11-04 DIAGNOSIS — C61 Malignant neoplasm of prostate: Secondary | ICD-10-CM | POA: Diagnosis not present

## 2019-11-04 DIAGNOSIS — G620 Drug-induced polyneuropathy: Secondary | ICD-10-CM | POA: Diagnosis not present

## 2019-11-04 DIAGNOSIS — E785 Hyperlipidemia, unspecified: Secondary | ICD-10-CM | POA: Diagnosis not present

## 2019-11-04 DIAGNOSIS — I252 Old myocardial infarction: Secondary | ICD-10-CM | POA: Diagnosis not present

## 2019-11-05 DIAGNOSIS — C7951 Secondary malignant neoplasm of bone: Secondary | ICD-10-CM | POA: Diagnosis not present

## 2019-11-05 DIAGNOSIS — G620 Drug-induced polyneuropathy: Secondary | ICD-10-CM | POA: Diagnosis not present

## 2019-11-05 DIAGNOSIS — I25119 Atherosclerotic heart disease of native coronary artery with unspecified angina pectoris: Secondary | ICD-10-CM | POA: Diagnosis not present

## 2019-11-05 DIAGNOSIS — C61 Malignant neoplasm of prostate: Secondary | ICD-10-CM | POA: Diagnosis not present

## 2019-11-05 DIAGNOSIS — I10 Essential (primary) hypertension: Secondary | ICD-10-CM | POA: Diagnosis not present

## 2019-11-05 DIAGNOSIS — I252 Old myocardial infarction: Secondary | ICD-10-CM | POA: Diagnosis not present

## 2019-11-05 DIAGNOSIS — E785 Hyperlipidemia, unspecified: Secondary | ICD-10-CM | POA: Diagnosis not present

## 2019-11-05 DIAGNOSIS — R630 Anorexia: Secondary | ICD-10-CM | POA: Diagnosis not present

## 2019-11-05 DIAGNOSIS — R634 Abnormal weight loss: Secondary | ICD-10-CM | POA: Diagnosis not present

## 2019-11-06 DIAGNOSIS — C7951 Secondary malignant neoplasm of bone: Secondary | ICD-10-CM | POA: Diagnosis not present

## 2019-11-06 DIAGNOSIS — E785 Hyperlipidemia, unspecified: Secondary | ICD-10-CM | POA: Diagnosis not present

## 2019-11-06 DIAGNOSIS — I252 Old myocardial infarction: Secondary | ICD-10-CM | POA: Diagnosis not present

## 2019-11-06 DIAGNOSIS — I25119 Atherosclerotic heart disease of native coronary artery with unspecified angina pectoris: Secondary | ICD-10-CM | POA: Diagnosis not present

## 2019-11-06 DIAGNOSIS — C61 Malignant neoplasm of prostate: Secondary | ICD-10-CM | POA: Diagnosis not present

## 2019-11-06 DIAGNOSIS — R630 Anorexia: Secondary | ICD-10-CM | POA: Diagnosis not present

## 2019-11-06 DIAGNOSIS — G620 Drug-induced polyneuropathy: Secondary | ICD-10-CM | POA: Diagnosis not present

## 2019-11-06 DIAGNOSIS — I10 Essential (primary) hypertension: Secondary | ICD-10-CM | POA: Diagnosis not present

## 2019-11-06 DIAGNOSIS — R634 Abnormal weight loss: Secondary | ICD-10-CM | POA: Diagnosis not present

## 2019-11-07 ENCOUNTER — Other Ambulatory Visit: Payer: Self-pay | Admitting: Internal Medicine

## 2019-11-07 DIAGNOSIS — I252 Old myocardial infarction: Secondary | ICD-10-CM | POA: Diagnosis not present

## 2019-11-07 DIAGNOSIS — C7951 Secondary malignant neoplasm of bone: Secondary | ICD-10-CM | POA: Diagnosis not present

## 2019-11-07 DIAGNOSIS — I25119 Atherosclerotic heart disease of native coronary artery with unspecified angina pectoris: Secondary | ICD-10-CM | POA: Diagnosis not present

## 2019-11-07 DIAGNOSIS — R634 Abnormal weight loss: Secondary | ICD-10-CM | POA: Diagnosis not present

## 2019-11-07 DIAGNOSIS — R630 Anorexia: Secondary | ICD-10-CM | POA: Diagnosis not present

## 2019-11-07 DIAGNOSIS — C61 Malignant neoplasm of prostate: Secondary | ICD-10-CM | POA: Diagnosis not present

## 2019-11-07 DIAGNOSIS — I10 Essential (primary) hypertension: Secondary | ICD-10-CM | POA: Diagnosis not present

## 2019-11-07 DIAGNOSIS — G620 Drug-induced polyneuropathy: Secondary | ICD-10-CM | POA: Diagnosis not present

## 2019-11-07 DIAGNOSIS — E785 Hyperlipidemia, unspecified: Secondary | ICD-10-CM | POA: Diagnosis not present

## 2019-11-08 DIAGNOSIS — C61 Malignant neoplasm of prostate: Secondary | ICD-10-CM | POA: Diagnosis not present

## 2019-11-08 DIAGNOSIS — I252 Old myocardial infarction: Secondary | ICD-10-CM | POA: Diagnosis not present

## 2019-11-08 DIAGNOSIS — R634 Abnormal weight loss: Secondary | ICD-10-CM | POA: Diagnosis not present

## 2019-11-08 DIAGNOSIS — C7951 Secondary malignant neoplasm of bone: Secondary | ICD-10-CM | POA: Diagnosis not present

## 2019-11-08 DIAGNOSIS — R630 Anorexia: Secondary | ICD-10-CM | POA: Diagnosis not present

## 2019-11-08 DIAGNOSIS — G620 Drug-induced polyneuropathy: Secondary | ICD-10-CM | POA: Diagnosis not present

## 2019-11-08 DIAGNOSIS — E785 Hyperlipidemia, unspecified: Secondary | ICD-10-CM | POA: Diagnosis not present

## 2019-11-08 DIAGNOSIS — I10 Essential (primary) hypertension: Secondary | ICD-10-CM | POA: Diagnosis not present

## 2019-11-08 DIAGNOSIS — I25119 Atherosclerotic heart disease of native coronary artery with unspecified angina pectoris: Secondary | ICD-10-CM | POA: Diagnosis not present

## 2019-11-09 DIAGNOSIS — C7951 Secondary malignant neoplasm of bone: Secondary | ICD-10-CM | POA: Diagnosis not present

## 2019-11-09 DIAGNOSIS — I25119 Atherosclerotic heart disease of native coronary artery with unspecified angina pectoris: Secondary | ICD-10-CM | POA: Diagnosis not present

## 2019-11-09 DIAGNOSIS — R634 Abnormal weight loss: Secondary | ICD-10-CM | POA: Diagnosis not present

## 2019-11-09 DIAGNOSIS — G620 Drug-induced polyneuropathy: Secondary | ICD-10-CM | POA: Diagnosis not present

## 2019-11-09 DIAGNOSIS — C61 Malignant neoplasm of prostate: Secondary | ICD-10-CM | POA: Diagnosis not present

## 2019-11-09 DIAGNOSIS — R630 Anorexia: Secondary | ICD-10-CM | POA: Diagnosis not present

## 2019-11-09 DIAGNOSIS — E785 Hyperlipidemia, unspecified: Secondary | ICD-10-CM | POA: Diagnosis not present

## 2019-11-09 DIAGNOSIS — I10 Essential (primary) hypertension: Secondary | ICD-10-CM | POA: Diagnosis not present

## 2019-11-09 DIAGNOSIS — I252 Old myocardial infarction: Secondary | ICD-10-CM | POA: Diagnosis not present

## 2019-11-10 DIAGNOSIS — I10 Essential (primary) hypertension: Secondary | ICD-10-CM | POA: Diagnosis not present

## 2019-11-10 DIAGNOSIS — C61 Malignant neoplasm of prostate: Secondary | ICD-10-CM | POA: Diagnosis not present

## 2019-11-10 DIAGNOSIS — C7951 Secondary malignant neoplasm of bone: Secondary | ICD-10-CM | POA: Diagnosis not present

## 2019-11-10 DIAGNOSIS — R634 Abnormal weight loss: Secondary | ICD-10-CM | POA: Diagnosis not present

## 2019-11-10 DIAGNOSIS — I25119 Atherosclerotic heart disease of native coronary artery with unspecified angina pectoris: Secondary | ICD-10-CM | POA: Diagnosis not present

## 2019-11-10 DIAGNOSIS — E785 Hyperlipidemia, unspecified: Secondary | ICD-10-CM | POA: Diagnosis not present

## 2019-11-10 DIAGNOSIS — R630 Anorexia: Secondary | ICD-10-CM | POA: Diagnosis not present

## 2019-11-10 DIAGNOSIS — G620 Drug-induced polyneuropathy: Secondary | ICD-10-CM | POA: Diagnosis not present

## 2019-11-10 DIAGNOSIS — I252 Old myocardial infarction: Secondary | ICD-10-CM | POA: Diagnosis not present

## 2019-11-11 DIAGNOSIS — G620 Drug-induced polyneuropathy: Secondary | ICD-10-CM | POA: Diagnosis not present

## 2019-11-11 DIAGNOSIS — I252 Old myocardial infarction: Secondary | ICD-10-CM | POA: Diagnosis not present

## 2019-11-11 DIAGNOSIS — R634 Abnormal weight loss: Secondary | ICD-10-CM | POA: Diagnosis not present

## 2019-11-11 DIAGNOSIS — C61 Malignant neoplasm of prostate: Secondary | ICD-10-CM | POA: Diagnosis not present

## 2019-11-11 DIAGNOSIS — C7951 Secondary malignant neoplasm of bone: Secondary | ICD-10-CM | POA: Diagnosis not present

## 2019-11-11 DIAGNOSIS — I10 Essential (primary) hypertension: Secondary | ICD-10-CM | POA: Diagnosis not present

## 2019-11-11 DIAGNOSIS — I25119 Atherosclerotic heart disease of native coronary artery with unspecified angina pectoris: Secondary | ICD-10-CM | POA: Diagnosis not present

## 2019-11-11 DIAGNOSIS — R630 Anorexia: Secondary | ICD-10-CM | POA: Diagnosis not present

## 2019-11-11 DIAGNOSIS — E785 Hyperlipidemia, unspecified: Secondary | ICD-10-CM | POA: Diagnosis not present

## 2019-11-12 DIAGNOSIS — C61 Malignant neoplasm of prostate: Secondary | ICD-10-CM | POA: Diagnosis not present

## 2019-11-12 DIAGNOSIS — R634 Abnormal weight loss: Secondary | ICD-10-CM | POA: Diagnosis not present

## 2019-11-12 DIAGNOSIS — G620 Drug-induced polyneuropathy: Secondary | ICD-10-CM | POA: Diagnosis not present

## 2019-11-12 DIAGNOSIS — E785 Hyperlipidemia, unspecified: Secondary | ICD-10-CM | POA: Diagnosis not present

## 2019-11-12 DIAGNOSIS — C7951 Secondary malignant neoplasm of bone: Secondary | ICD-10-CM | POA: Diagnosis not present

## 2019-11-12 DIAGNOSIS — I25119 Atherosclerotic heart disease of native coronary artery with unspecified angina pectoris: Secondary | ICD-10-CM | POA: Diagnosis not present

## 2019-11-12 DIAGNOSIS — I10 Essential (primary) hypertension: Secondary | ICD-10-CM | POA: Diagnosis not present

## 2019-11-12 DIAGNOSIS — R630 Anorexia: Secondary | ICD-10-CM | POA: Diagnosis not present

## 2019-11-12 DIAGNOSIS — I252 Old myocardial infarction: Secondary | ICD-10-CM | POA: Diagnosis not present

## 2019-11-13 DIAGNOSIS — C7951 Secondary malignant neoplasm of bone: Secondary | ICD-10-CM | POA: Diagnosis not present

## 2019-11-13 DIAGNOSIS — I252 Old myocardial infarction: Secondary | ICD-10-CM | POA: Diagnosis not present

## 2019-11-13 DIAGNOSIS — I10 Essential (primary) hypertension: Secondary | ICD-10-CM | POA: Diagnosis not present

## 2019-11-13 DIAGNOSIS — C61 Malignant neoplasm of prostate: Secondary | ICD-10-CM | POA: Diagnosis not present

## 2019-11-13 DIAGNOSIS — R630 Anorexia: Secondary | ICD-10-CM | POA: Diagnosis not present

## 2019-11-13 DIAGNOSIS — G620 Drug-induced polyneuropathy: Secondary | ICD-10-CM | POA: Diagnosis not present

## 2019-11-13 DIAGNOSIS — E785 Hyperlipidemia, unspecified: Secondary | ICD-10-CM | POA: Diagnosis not present

## 2019-11-13 DIAGNOSIS — I25119 Atherosclerotic heart disease of native coronary artery with unspecified angina pectoris: Secondary | ICD-10-CM | POA: Diagnosis not present

## 2019-11-13 DIAGNOSIS — R634 Abnormal weight loss: Secondary | ICD-10-CM | POA: Diagnosis not present

## 2019-11-14 DIAGNOSIS — R630 Anorexia: Secondary | ICD-10-CM | POA: Diagnosis not present

## 2019-11-14 DIAGNOSIS — I252 Old myocardial infarction: Secondary | ICD-10-CM | POA: Diagnosis not present

## 2019-11-14 DIAGNOSIS — C61 Malignant neoplasm of prostate: Secondary | ICD-10-CM | POA: Diagnosis not present

## 2019-11-14 DIAGNOSIS — C7951 Secondary malignant neoplasm of bone: Secondary | ICD-10-CM | POA: Diagnosis not present

## 2019-11-14 DIAGNOSIS — E785 Hyperlipidemia, unspecified: Secondary | ICD-10-CM | POA: Diagnosis not present

## 2019-11-14 DIAGNOSIS — R634 Abnormal weight loss: Secondary | ICD-10-CM | POA: Diagnosis not present

## 2019-11-14 DIAGNOSIS — G620 Drug-induced polyneuropathy: Secondary | ICD-10-CM | POA: Diagnosis not present

## 2019-11-14 DIAGNOSIS — I10 Essential (primary) hypertension: Secondary | ICD-10-CM | POA: Diagnosis not present

## 2019-11-14 DIAGNOSIS — I25119 Atherosclerotic heart disease of native coronary artery with unspecified angina pectoris: Secondary | ICD-10-CM | POA: Diagnosis not present

## 2019-11-15 DIAGNOSIS — I10 Essential (primary) hypertension: Secondary | ICD-10-CM | POA: Diagnosis not present

## 2019-11-15 DIAGNOSIS — I25119 Atherosclerotic heart disease of native coronary artery with unspecified angina pectoris: Secondary | ICD-10-CM | POA: Diagnosis not present

## 2019-11-15 DIAGNOSIS — R634 Abnormal weight loss: Secondary | ICD-10-CM | POA: Diagnosis not present

## 2019-11-15 DIAGNOSIS — G620 Drug-induced polyneuropathy: Secondary | ICD-10-CM | POA: Diagnosis not present

## 2019-11-15 DIAGNOSIS — I252 Old myocardial infarction: Secondary | ICD-10-CM | POA: Diagnosis not present

## 2019-11-15 DIAGNOSIS — C61 Malignant neoplasm of prostate: Secondary | ICD-10-CM | POA: Diagnosis not present

## 2019-11-15 DIAGNOSIS — R630 Anorexia: Secondary | ICD-10-CM | POA: Diagnosis not present

## 2019-11-15 DIAGNOSIS — C7951 Secondary malignant neoplasm of bone: Secondary | ICD-10-CM | POA: Diagnosis not present

## 2019-11-15 DIAGNOSIS — E785 Hyperlipidemia, unspecified: Secondary | ICD-10-CM | POA: Diagnosis not present

## 2019-11-16 DIAGNOSIS — I252 Old myocardial infarction: Secondary | ICD-10-CM | POA: Diagnosis not present

## 2019-11-16 DIAGNOSIS — E785 Hyperlipidemia, unspecified: Secondary | ICD-10-CM | POA: Diagnosis not present

## 2019-11-16 DIAGNOSIS — I25119 Atherosclerotic heart disease of native coronary artery with unspecified angina pectoris: Secondary | ICD-10-CM | POA: Diagnosis not present

## 2019-11-16 DIAGNOSIS — C7951 Secondary malignant neoplasm of bone: Secondary | ICD-10-CM | POA: Diagnosis not present

## 2019-11-16 DIAGNOSIS — C61 Malignant neoplasm of prostate: Secondary | ICD-10-CM | POA: Diagnosis not present

## 2019-11-16 DIAGNOSIS — R630 Anorexia: Secondary | ICD-10-CM | POA: Diagnosis not present

## 2019-11-16 DIAGNOSIS — R634 Abnormal weight loss: Secondary | ICD-10-CM | POA: Diagnosis not present

## 2019-11-16 DIAGNOSIS — G620 Drug-induced polyneuropathy: Secondary | ICD-10-CM | POA: Diagnosis not present

## 2019-11-16 DIAGNOSIS — I10 Essential (primary) hypertension: Secondary | ICD-10-CM | POA: Diagnosis not present

## 2019-11-17 DIAGNOSIS — I10 Essential (primary) hypertension: Secondary | ICD-10-CM | POA: Diagnosis not present

## 2019-11-17 DIAGNOSIS — E785 Hyperlipidemia, unspecified: Secondary | ICD-10-CM | POA: Diagnosis not present

## 2019-11-17 DIAGNOSIS — C7951 Secondary malignant neoplasm of bone: Secondary | ICD-10-CM | POA: Diagnosis not present

## 2019-11-17 DIAGNOSIS — I25119 Atherosclerotic heart disease of native coronary artery with unspecified angina pectoris: Secondary | ICD-10-CM | POA: Diagnosis not present

## 2019-11-17 DIAGNOSIS — G620 Drug-induced polyneuropathy: Secondary | ICD-10-CM | POA: Diagnosis not present

## 2019-11-17 DIAGNOSIS — R634 Abnormal weight loss: Secondary | ICD-10-CM | POA: Diagnosis not present

## 2019-11-17 DIAGNOSIS — C61 Malignant neoplasm of prostate: Secondary | ICD-10-CM | POA: Diagnosis not present

## 2019-11-17 DIAGNOSIS — I252 Old myocardial infarction: Secondary | ICD-10-CM | POA: Diagnosis not present

## 2019-11-17 DIAGNOSIS — R630 Anorexia: Secondary | ICD-10-CM | POA: Diagnosis not present

## 2019-11-18 DIAGNOSIS — E785 Hyperlipidemia, unspecified: Secondary | ICD-10-CM | POA: Diagnosis not present

## 2019-11-18 DIAGNOSIS — I25119 Atherosclerotic heart disease of native coronary artery with unspecified angina pectoris: Secondary | ICD-10-CM | POA: Diagnosis not present

## 2019-11-18 DIAGNOSIS — R634 Abnormal weight loss: Secondary | ICD-10-CM | POA: Diagnosis not present

## 2019-11-18 DIAGNOSIS — R630 Anorexia: Secondary | ICD-10-CM | POA: Diagnosis not present

## 2019-11-18 DIAGNOSIS — C7951 Secondary malignant neoplasm of bone: Secondary | ICD-10-CM | POA: Diagnosis not present

## 2019-11-18 DIAGNOSIS — G620 Drug-induced polyneuropathy: Secondary | ICD-10-CM | POA: Diagnosis not present

## 2019-11-18 DIAGNOSIS — I10 Essential (primary) hypertension: Secondary | ICD-10-CM | POA: Diagnosis not present

## 2019-11-18 DIAGNOSIS — I252 Old myocardial infarction: Secondary | ICD-10-CM | POA: Diagnosis not present

## 2019-11-18 DIAGNOSIS — C61 Malignant neoplasm of prostate: Secondary | ICD-10-CM | POA: Diagnosis not present

## 2019-11-19 DIAGNOSIS — C61 Malignant neoplasm of prostate: Secondary | ICD-10-CM | POA: Diagnosis not present

## 2019-11-19 DIAGNOSIS — R630 Anorexia: Secondary | ICD-10-CM | POA: Diagnosis not present

## 2019-11-19 DIAGNOSIS — I10 Essential (primary) hypertension: Secondary | ICD-10-CM | POA: Diagnosis not present

## 2019-11-19 DIAGNOSIS — I252 Old myocardial infarction: Secondary | ICD-10-CM | POA: Diagnosis not present

## 2019-11-19 DIAGNOSIS — E785 Hyperlipidemia, unspecified: Secondary | ICD-10-CM | POA: Diagnosis not present

## 2019-11-19 DIAGNOSIS — C7951 Secondary malignant neoplasm of bone: Secondary | ICD-10-CM | POA: Diagnosis not present

## 2019-11-19 DIAGNOSIS — R634 Abnormal weight loss: Secondary | ICD-10-CM | POA: Diagnosis not present

## 2019-11-19 DIAGNOSIS — G620 Drug-induced polyneuropathy: Secondary | ICD-10-CM | POA: Diagnosis not present

## 2019-11-19 DIAGNOSIS — I25119 Atherosclerotic heart disease of native coronary artery with unspecified angina pectoris: Secondary | ICD-10-CM | POA: Diagnosis not present

## 2019-11-20 ENCOUNTER — Other Ambulatory Visit: Payer: Self-pay | Admitting: Hospice and Palliative Medicine

## 2019-11-20 DIAGNOSIS — C7951 Secondary malignant neoplasm of bone: Secondary | ICD-10-CM | POA: Diagnosis not present

## 2019-11-20 DIAGNOSIS — C61 Malignant neoplasm of prostate: Secondary | ICD-10-CM

## 2019-11-20 DIAGNOSIS — I10 Essential (primary) hypertension: Secondary | ICD-10-CM | POA: Diagnosis not present

## 2019-11-20 DIAGNOSIS — G620 Drug-induced polyneuropathy: Secondary | ICD-10-CM | POA: Diagnosis not present

## 2019-11-20 DIAGNOSIS — I25119 Atherosclerotic heart disease of native coronary artery with unspecified angina pectoris: Secondary | ICD-10-CM | POA: Diagnosis not present

## 2019-11-20 DIAGNOSIS — R634 Abnormal weight loss: Secondary | ICD-10-CM | POA: Diagnosis not present

## 2019-11-20 DIAGNOSIS — G893 Neoplasm related pain (acute) (chronic): Secondary | ICD-10-CM

## 2019-11-20 DIAGNOSIS — I252 Old myocardial infarction: Secondary | ICD-10-CM | POA: Diagnosis not present

## 2019-11-20 DIAGNOSIS — R630 Anorexia: Secondary | ICD-10-CM | POA: Diagnosis not present

## 2019-11-20 DIAGNOSIS — E785 Hyperlipidemia, unspecified: Secondary | ICD-10-CM | POA: Diagnosis not present

## 2019-11-20 MED ORDER — FENTANYL 100 MCG/HR TD PT72: 1.0000 | MEDICATED_PATCH | TRANSDERMAL | 0 refills | Status: DC

## 2019-11-20 MED ORDER — FENTANYL 50 MCG/HR TD PT72: 1.0000 | MEDICATED_PATCH | TRANSDERMAL | 0 refills | Status: DC

## 2019-11-20 NOTE — Progress Notes (Signed)
Refills of Fentanyl requested by hospice RN.

## 2019-11-21 DIAGNOSIS — I10 Essential (primary) hypertension: Secondary | ICD-10-CM | POA: Diagnosis not present

## 2019-11-21 DIAGNOSIS — G620 Drug-induced polyneuropathy: Secondary | ICD-10-CM | POA: Diagnosis not present

## 2019-11-21 DIAGNOSIS — C61 Malignant neoplasm of prostate: Secondary | ICD-10-CM | POA: Diagnosis not present

## 2019-11-21 DIAGNOSIS — R634 Abnormal weight loss: Secondary | ICD-10-CM | POA: Diagnosis not present

## 2019-11-21 DIAGNOSIS — C7951 Secondary malignant neoplasm of bone: Secondary | ICD-10-CM | POA: Diagnosis not present

## 2019-11-21 DIAGNOSIS — I25119 Atherosclerotic heart disease of native coronary artery with unspecified angina pectoris: Secondary | ICD-10-CM | POA: Diagnosis not present

## 2019-11-21 DIAGNOSIS — E785 Hyperlipidemia, unspecified: Secondary | ICD-10-CM | POA: Diagnosis not present

## 2019-11-21 DIAGNOSIS — I252 Old myocardial infarction: Secondary | ICD-10-CM | POA: Diagnosis not present

## 2019-11-21 DIAGNOSIS — R630 Anorexia: Secondary | ICD-10-CM | POA: Diagnosis not present

## 2019-11-22 DIAGNOSIS — C7951 Secondary malignant neoplasm of bone: Secondary | ICD-10-CM | POA: Diagnosis not present

## 2019-11-22 DIAGNOSIS — E785 Hyperlipidemia, unspecified: Secondary | ICD-10-CM | POA: Diagnosis not present

## 2019-11-22 DIAGNOSIS — I10 Essential (primary) hypertension: Secondary | ICD-10-CM | POA: Diagnosis not present

## 2019-11-22 DIAGNOSIS — I252 Old myocardial infarction: Secondary | ICD-10-CM | POA: Diagnosis not present

## 2019-11-22 DIAGNOSIS — R630 Anorexia: Secondary | ICD-10-CM | POA: Diagnosis not present

## 2019-11-22 DIAGNOSIS — C61 Malignant neoplasm of prostate: Secondary | ICD-10-CM | POA: Diagnosis not present

## 2019-11-22 DIAGNOSIS — G620 Drug-induced polyneuropathy: Secondary | ICD-10-CM | POA: Diagnosis not present

## 2019-11-22 DIAGNOSIS — I25119 Atherosclerotic heart disease of native coronary artery with unspecified angina pectoris: Secondary | ICD-10-CM | POA: Diagnosis not present

## 2019-11-22 DIAGNOSIS — R634 Abnormal weight loss: Secondary | ICD-10-CM | POA: Diagnosis not present

## 2019-11-23 DIAGNOSIS — I252 Old myocardial infarction: Secondary | ICD-10-CM | POA: Diagnosis not present

## 2019-11-23 DIAGNOSIS — I25119 Atherosclerotic heart disease of native coronary artery with unspecified angina pectoris: Secondary | ICD-10-CM | POA: Diagnosis not present

## 2019-11-23 DIAGNOSIS — R630 Anorexia: Secondary | ICD-10-CM | POA: Diagnosis not present

## 2019-11-23 DIAGNOSIS — R634 Abnormal weight loss: Secondary | ICD-10-CM | POA: Diagnosis not present

## 2019-11-23 DIAGNOSIS — C61 Malignant neoplasm of prostate: Secondary | ICD-10-CM | POA: Diagnosis not present

## 2019-11-23 DIAGNOSIS — E785 Hyperlipidemia, unspecified: Secondary | ICD-10-CM | POA: Diagnosis not present

## 2019-11-23 DIAGNOSIS — G620 Drug-induced polyneuropathy: Secondary | ICD-10-CM | POA: Diagnosis not present

## 2019-11-23 DIAGNOSIS — I10 Essential (primary) hypertension: Secondary | ICD-10-CM | POA: Diagnosis not present

## 2019-11-23 DIAGNOSIS — C7951 Secondary malignant neoplasm of bone: Secondary | ICD-10-CM | POA: Diagnosis not present

## 2019-11-24 DIAGNOSIS — I252 Old myocardial infarction: Secondary | ICD-10-CM | POA: Diagnosis not present

## 2019-11-24 DIAGNOSIS — C7951 Secondary malignant neoplasm of bone: Secondary | ICD-10-CM | POA: Diagnosis not present

## 2019-11-24 DIAGNOSIS — G620 Drug-induced polyneuropathy: Secondary | ICD-10-CM | POA: Diagnosis not present

## 2019-11-24 DIAGNOSIS — E785 Hyperlipidemia, unspecified: Secondary | ICD-10-CM | POA: Diagnosis not present

## 2019-11-24 DIAGNOSIS — I25119 Atherosclerotic heart disease of native coronary artery with unspecified angina pectoris: Secondary | ICD-10-CM | POA: Diagnosis not present

## 2019-11-24 DIAGNOSIS — C61 Malignant neoplasm of prostate: Secondary | ICD-10-CM | POA: Diagnosis not present

## 2019-11-24 DIAGNOSIS — I10 Essential (primary) hypertension: Secondary | ICD-10-CM | POA: Diagnosis not present

## 2019-11-24 DIAGNOSIS — R630 Anorexia: Secondary | ICD-10-CM | POA: Diagnosis not present

## 2019-11-24 DIAGNOSIS — R634 Abnormal weight loss: Secondary | ICD-10-CM | POA: Diagnosis not present

## 2019-11-25 ENCOUNTER — Inpatient Hospital Stay: Payer: 59 | Attending: Internal Medicine

## 2019-11-25 ENCOUNTER — Other Ambulatory Visit: Payer: Self-pay

## 2019-11-25 ENCOUNTER — Inpatient Hospital Stay (HOSPITAL_BASED_OUTPATIENT_CLINIC_OR_DEPARTMENT_OTHER): Payer: 59 | Admitting: Internal Medicine

## 2019-11-25 DIAGNOSIS — C61 Malignant neoplasm of prostate: Secondary | ICD-10-CM | POA: Insufficient documentation

## 2019-11-25 DIAGNOSIS — Z87891 Personal history of nicotine dependence: Secondary | ICD-10-CM | POA: Insufficient documentation

## 2019-11-25 DIAGNOSIS — I252 Old myocardial infarction: Secondary | ICD-10-CM | POA: Diagnosis not present

## 2019-11-25 DIAGNOSIS — I25119 Atherosclerotic heart disease of native coronary artery with unspecified angina pectoris: Secondary | ICD-10-CM | POA: Diagnosis not present

## 2019-11-25 DIAGNOSIS — Z993 Dependence on wheelchair: Secondary | ICD-10-CM | POA: Insufficient documentation

## 2019-11-25 DIAGNOSIS — R634 Abnormal weight loss: Secondary | ICD-10-CM | POA: Diagnosis not present

## 2019-11-25 DIAGNOSIS — G893 Neoplasm related pain (acute) (chronic): Secondary | ICD-10-CM | POA: Diagnosis not present

## 2019-11-25 DIAGNOSIS — C7951 Secondary malignant neoplasm of bone: Secondary | ICD-10-CM | POA: Diagnosis not present

## 2019-11-25 DIAGNOSIS — I25118 Atherosclerotic heart disease of native coronary artery with other forms of angina pectoris: Secondary | ICD-10-CM

## 2019-11-25 DIAGNOSIS — E785 Hyperlipidemia, unspecified: Secondary | ICD-10-CM | POA: Diagnosis not present

## 2019-11-25 DIAGNOSIS — R630 Anorexia: Secondary | ICD-10-CM | POA: Diagnosis not present

## 2019-11-25 DIAGNOSIS — G620 Drug-induced polyneuropathy: Secondary | ICD-10-CM | POA: Diagnosis not present

## 2019-11-25 DIAGNOSIS — I10 Essential (primary) hypertension: Secondary | ICD-10-CM | POA: Diagnosis not present

## 2019-11-25 LAB — COMPREHENSIVE METABOLIC PANEL
ALT: 26 U/L (ref 0–44)
AST: 18 U/L (ref 15–41)
Albumin: 3.7 g/dL (ref 3.5–5.0)
Alkaline Phosphatase: 46 U/L (ref 38–126)
Anion gap: 12 (ref 5–15)
BUN: 27 mg/dL — ABNORMAL HIGH (ref 8–23)
CO2: 26 mmol/L (ref 22–32)
Calcium: 9.2 mg/dL (ref 8.9–10.3)
Chloride: 96 mmol/L — ABNORMAL LOW (ref 98–111)
Creatinine, Ser: 0.78 mg/dL (ref 0.61–1.24)
GFR, Estimated: >60 mL/min (ref >60)
Glucose, Bld: 209 mg/dL — ABNORMAL HIGH (ref 70–99)
Potassium: 3.8 mmol/L (ref 3.5–5.1)
Sodium: 134 mmol/L — ABNORMAL LOW (ref 135–145)
Total Bilirubin: 0.7 mg/dL (ref 0.3–1.2)
Total Protein: 6.2 g/dL — ABNORMAL LOW (ref 6.5–8.1)

## 2019-11-25 LAB — CBC WITH DIFFERENTIAL/PLATELET
Abs Immature Granulocytes: 0.11 10*3/uL — ABNORMAL HIGH (ref 0.00–0.07)
Basophils Absolute: 0 10*3/uL (ref 0.0–0.1)
Basophils Relative: 0 %
Eosinophils Absolute: 0 10*3/uL (ref 0.0–0.5)
Eosinophils Relative: 0 %
HCT: 38.6 % — ABNORMAL LOW (ref 39.0–52.0)
Hemoglobin: 12.8 g/dL — ABNORMAL LOW (ref 13.0–17.0)
Immature Granulocytes: 1 %
Lymphocytes Relative: 15 %
Lymphs Abs: 1.2 10*3/uL (ref 0.7–4.0)
MCH: 31.7 pg (ref 26.0–34.0)
MCHC: 33.2 g/dL (ref 30.0–36.0)
MCV: 95.5 fL (ref 80.0–100.0)
Monocytes Absolute: 0.4 10*3/uL (ref 0.1–1.0)
Monocytes Relative: 5 %
Neutro Abs: 6 10*3/uL (ref 1.7–7.7)
Neutrophils Relative %: 79 %
Platelets: 194 10*3/uL (ref 150–400)
RBC: 4.04 MIL/uL — ABNORMAL LOW (ref 4.22–5.81)
RDW: 16.5 % — ABNORMAL HIGH (ref 11.5–15.5)
WBC: 7.7 10*3/uL (ref 4.0–10.5)
nRBC: 0 % (ref 0.0–0.2)

## 2019-11-25 NOTE — Assessment & Plan Note (Addendum)
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 ADT;  MAY 2021- PET- Improved; however PSA- rising.  Most recently on cabazitaxel s/p #2 [80% dose]- poorly tolerated [see below];  # Clinically stable; continue off chemotherapy; continue hospice.  #Fatigue  STABLE;  hold IV fluids and dexamethasone. continue oral dexamethasone 2 mg p.o. twice a day [taper]  # Malignancy related pain- continue fentanyl patch hydrocodone;. Continue fentanyl patch to every 48 hours and continue oxycodone prn; STABLE.  Refilled fentanyl 100 mcg.  *Patient preference/follow-up # DISPOSITION: # 61month - MD; labs- cbc/cmp;PSA-Dr.B

## 2019-11-25 NOTE — Progress Notes (Signed)
[place] OFFICE PROGRESS NOTE  Patient Care Team: Olin Hauser, [name], MD as PCP - Cardiology (Cardiology) Minna Merritts, [name], [name]  Cancer Staging No matching staging information was found for the patient.   Oncology History Overview Note  # 2011- PROSTATE CANCER [Gleason 3+4]; s/p Prostatectomy [ also involved bladder neck/ECP; Dr.Polaseck]; July 2014- Biochemical recurrence [PSA 14]- started on Zoladex [Dr.Pandit]; lost to follow up.  # JAN 2017- STAGE IV METASTATIC PROSTATE Cancer to Bone- Feb 13th, 2017-  Lupron q 36m[~end of feb]; PSA: 1021; Declined Chemo; April 2017 [xofigo x6; Dr.Crystal]; AUG 2017- Zytiga + Prednisone BID. Bone scan-Jan 2018- improved skeletal metastases.   # MAY 1st 2019- START X-tandi [stopped zytiga/PSA- 8.8/rising]  #August 23, 2018-stop Xtandi [intolerance-fatigue mental fogginess]  #Mid September 2020-apalutamide; stopped mid-October poor tolerance/progression  # 11/01/2018- -Taxotere weekly [consent]; July 2021-progression of disease/PSA   # 08/13/2019-cabazitaxel; Neulasta  # Mets to bone- start X-geva q 20M [May 30th]; hold dental issues/infections  # Smoker/ chronic pain/pain clinic   #[April 2018; [place]]- s/p stenting; on Plavix; --------------------------------------------------------------  DIAGNOSIS: '[ ]'  Castrate resistant prostate cancer  STAGE:   4    ;GOALS: Palliative  CURRENT/MOST RECENT THERAPY-cabazitaxel    [place] cancer [place] to bone ([facility]  12/08/2014 Initial Diagnosis   [place] bone ([facility]   11/01/2018 - 07/09/2019 Chemotherapy   The patient had DOCEtaxel (TAXOTERE) 70 mg in sodium chloride 0.9 % 150 mL chemo infusion, 36 mg/m2 = 70 mg, Intravenous,  Once, 19 of 21 cycles Administration: 70 mg (11/08/2018),  70 mg (11/22/2018), 70 mg (12/13/2018), 70 mg (12/06/2018), 70 mg (12/31/2018), 70 mg (01/07/2019), 70 mg (01/29/2019), 70 mg (12/20/2018), 70 mg (02/06/2019), 70 mg (02/14/2019), 70 mg (02/28/2019), 70 mg (03/14/2019), 70 mg (03/28/2019), 70 mg (04/15/2019), 70 mg (04/29/2019), 70 mg (05/27/2019), 70 mg (06/24/2019), 70 mg (07/09/2019)  for chemotherapy treatment.    08/13/2019 -  Chemotherapy   The patient had pegfilgrastim (NEULASTA) injection 6 mg, 6 mg, Subcutaneous, Once, 3 of 6 cycles Administration: 6 mg (08/14/2019), 6 mg (09/19/2019) cabazitaxel (JEVTANA) 38 mg in sodium chloride 0.9 % 250 mL chemo infusion, 20 mg/m2 = 38 mg, Intravenous,  Once, 3 of 6 cycles Dose modification: 16 mg/m2 (original dose 20 mg/m2, Cycle 3, Reason: Provider Judgment) Administration: 38 mg (08/13/2019), 30 mg (09/18/2019)  for chemotherapy treatment.     INTERVAL HISTORY:  Robert [id number].  male pleasant patient above history of metastatic castrate resistant prostate cancer most recently on cabazitaxel is here for follow-up.  In the interim patient has been enrolled in hospice.  Is currently on dexamethasone 2 mg twice a day.  States his appetite is improved on dexamethasone.  His energy levels are good.  No nausea no vomiting.  States his pain is better controlled currently on fentanyl and oxycodone.  Shortness of breath with exertion.  Not any worse.  No nausea no vomiting.  Review of Systems  Constitutional: Positive for malaise/fatigue and weight loss. Negative for chills, diaphoresis and fever.  HENT: Negative for nosebleeds and sore throat.   Eyes: Negative for double vision.  Respiratory: Positive for shortness of breath. Negative for cough, hemoptysis, sputum production and wheezing.   Cardiovascular: Negative for chest pain, palpitations and orthopnea.  Gastrointestinal: Positive for nausea. Negative for abdominal pain, blood in stool, diarrhea, heartburn, melena  and vomiting.  Genitourinary: Negative for  dysuria, frequency and urgency.  Musculoskeletal: Positive for back pain and joint pain.  Skin: Negative for itching.  Neurological: Negative for dizziness, tingling, focal weakness, weakness and headaches.  Endo/Heme/Allergies: Does not bruise/bleed easily.  Psychiatric/Behavioral: Negative for depression. The patient is not nervous/anxious and does not have insomnia.       PAST MEDICAL HISTORY :  Past Medical History:  Diagnosis Date  . Back pain 10/09/2012  . Bone cancer ([place])   . CAD (coronary artery disease)    a. 08/2015 PCI: LM nl, LAD 20d, LCX nl, RCA 35m RPAV 95 (3.0x18 Xience Alpine DES);  b. 04/2016 PCI ([facility]: RPL 95 (2.75x8 Promus Premier DES).  . Cancer associated pain   . Depression   . History of echocardiogram    a. 08/2016 Echo: EF 50-55%.  .Marland KitchenHistory of kidney stones   . Hyperlipidemia   . Hypertension   . Joint pain   . Prostate cancer ([facility]    a.  s/p prostatectomy ([facility]);  b. bone mets noted 04/2016.  . Right arm pain 01/10/2016  . Right foot pain 01/10/2016  . Right leg pain 01/10/2016  . Tobacco abuse     PAST SURGICAL HISTORY :   Past Surgical History:  Procedure Laterality Date  . CARDIAC CATHETERIZATION     [facility]  . CARDIAC CATHETERIZATION N/A 08/16/2015   Procedure: Left Heart Cath and Coronary Angiography;  Surgeon: DYolonda Kida MD;  [facility];  Service: Cardiovascular;  Laterality: N/A;  . CARDIAC CATHETERIZATION N/A 08/16/2015   Procedure: Coronary Stent Intervention;  Surgeon: DYolonda Kida MD;  [facility];  Service: Cardiovascular;  Laterality: N/A;  . CERVIX SURGERY    . PROSTATECTOMY    . SPINE SURGERY    . TONSILLECTOMY    . WRIST SURGERY      FAMILY HISTORY :   Family History  Problem Relation Age of Onset  . Heart attack Mother   . Hypertension Mother   . Heart attack Father     SOCIAL HISTORY:   Social History   Tobacco Use  . Smoking status: Former Smoker    Packs/day:  0.50    Years: 40.00    Pack years: 20.00    Types: Cigarettes  . Smokeless tobacco: Current User    Types: Chew  . Tobacco comment: 1 pack every couple days   Vaping Use  . Vaping Use: Never used  Substance Use Topics  . Alcohol use: Yes    Comment: occasionally  . Drug use: No    ALLERGIES:  is allergic to ditropan [oxybutynin].  MEDICATIONS:  Current Outpatient Medications  Medication Sig Dispense Refill  . acetaminophen (TYLENOL) 500 MG tablet Take 1,000 mg by mouth every 8 (eight) hours as needed.     .Marland Kitchenalbuterol (PROVENTIL HFA;VENTOLIN HFA) 108 (90 Base) MCG/ACT inhaler Inhale 1-2 puffs into the lungs every 6 (six) hours as needed for wheezing or shortness of breath. 1 Inhaler 0  . aspirin 81 MG tablet Take 81 mg by mouth daily.    .Marland Kitchenatorvastatin (LIPITOR) 80 MG tablet Take 1 tablet (80 mg total) by mouth daily at 6 PM. 90 tablet 3  . clopidogrel (PLAVIX) 75 MG tablet Take 1 tablet (75 mg total) by mouth daily. 90 tablet 3  . dexamethasone (DECADRON) 4 MG tablet Take 1 tablet (4 mg total) by mouth 2 (two) times daily with a meal. 30 tablet 2  .  fentaNYL (DURAGESIC) 100 MCG/HR Place 1 patch onto the skin every other day. Along with 29mg patch for total of 150 mcg every 2 days. 15 patch 0  . fentaNYL (DURAGESIC) 50 MCG/HR Place 1 patch onto the skin every other day. Along with 1075m patch for total of 150 mcg every 2 days. 15 patch 0  . gabapentin (NEURONTIN) 300 MG capsule Take 1 capsule (300 mg total) by mouth 3 (three) times daily. 90 capsule 2  . magic mouthwash SOLN Take 5 mLs by mouth 4 (four) times daily. 240 mL 0  . metoprolol tartrate (LOPRESSOR) 25 MG tablet TAKE 1/2 TABLET BY MOUTH 2 TIMES DAILY. 90 tablet 1  . ondansetron (ZOFRAN) 8 MG tablet Take 8 mg by mouth every 8 (eight) hours as needed for nausea or vomiting.    . Marland KitchenxyCODONE (ROXICODONE) 15 MG immediate release tablet TAKE 1 TABLET BY MOUTH EVERY 6 HOURS AS NEEDED FOR PAIN 90 tablet 0  . polyethylene glycol  (MIRALAX / GLYCOLAX) packet Take 17 g by mouth daily as needed for moderate constipation.     . promethazine (PHENERGAN) 25 MG tablet TAKE 1/2 TO 1 TABLET (12.5-25 MG TOTAL) BY MOUTH EVERY 8 (EIGHT) HOURS AS NEEDED FOR NAUSEA OR VOMITING. 60 tablet 3  . traZODone (DESYREL) 50 MG tablet Take 1-2 tablets (50-100 mg total) by mouth at bedtime as needed for sleep. 60 tablet 2  . naloxone (NARCAN) nasal spray 4 mg/0.1 mL 1 spray into nostril upon signs of opioid overdose. Call 911. May repeat once if no response within 2-3 minutes. (Patient not taking: Reported on 10/03/2019) 1 kit 0  . nitroGLYCERIN (NITROSTAT) 0.4 MG SL tablet Place 1 tablet (0.4 mg total) under the tongue every 5 (five) minutes as needed. (Patient not taking: Reported on 10/03/2019) 25 tablet 3   No current facility-administered medications for this visit.    PHYSICAL EXAMINATION: ECOG PERFORMANCE STATUS: 1 - Symptomatic but completely ambulatory  BP (!) 158/101 (BP Location: Left Arm, Patient Position: Sitting) Comment: rechecked 162/96. Informed CMA Taylor of HBP  Pulse 91   Temp 98.1 F (36.7 C) (Tympanic)   Resp 16   Wt 150 lb 9.6 oz (68.3 kg)   SpO2 100%   BMI 21.00 kg/m   Filed Weights   11/25/19 1430  Weight: 150 lb 9.6 oz (68.3 kg)    Physical Exam Constitutional:      Comments:  Frail-appearing Caucasian male patient.  He is in a wheelchair.. Marland KitchenHe is accompanied by his wife.  HENT:     Head: Normocephalic and atraumatic.     Mouth/Throat:     Pharynx: No oropharyngeal exudate.  Eyes:     Pupils: Pupils are equal, round, and reactive to light.  Cardiovascular:     Rate and Rhythm: Normal rate and regular rhythm.  Pulmonary:     Effort: No respiratory distress.     Breath sounds: No wheezing.  Abdominal:     General: Bowel sounds are normal. There is no distension.     Palpations: Abdomen is soft. There is no mass.     Tenderness: There is no abdominal tenderness. There is no guarding or rebound.   Musculoskeletal:        General: No tenderness. Normal range of motion.     Cervical back: Normal range of motion and neck supple.     Comments: .  Skin:    General: Skin is warm.     Comments: Macular rash on hands  Neurological:     Mental Status: He is alert and oriented to person, place, and time.  Psychiatric:        Mood and Affect: Affect normal.     LABORATORY DATA:  I have reviewed the data as listed    Component Value Date/Time   NA 134 (L) 11/25/2019 1412   NA 140 07/13/2013 1542   K 3.8 11/25/2019 1412   K 4.3 07/13/2013 1542   CL 96 (L) 11/25/2019 1412   CL 107 07/13/2013 1542   CO2 26 11/25/2019 1412   CO2 26 07/13/2013 1542   GLUCOSE 209 (H) 11/25/2019 1412   GLUCOSE 106 (H) 07/13/2013 1542   BUN 27 (H) 11/25/2019 1412   BUN 11 07/13/2013 1542   CREATININE 0.78 11/25/2019 1412   CREATININE 0.76 10/15/2017 1001   CALCIUM 9.2 11/25/2019 1412   CALCIUM 9.9 07/13/2013 1542   PROT 6.2 (L) 11/25/2019 1412   PROT 8.1 07/13/2013 1542   ALBUMIN 3.7 11/25/2019 1412   ALBUMIN 4.3 07/13/2013 1542   AST 18 11/25/2019 1412   AST 26 07/13/2013 1542   ALT 26 11/25/2019 1412   ALT 32 07/13/2013 1542   ALKPHOS 46 11/25/2019 1412   ALKPHOS 74 07/13/2013 1542   BILITOT 0.7 11/25/2019 1412   BILITOT 0.4 07/13/2013 1542   GFRNONAA >60 11/25/2019 1412   GFRNONAA 95 10/15/2017 1001   GFRAA >60 10/03/2019 1349   GFRAA 110 10/15/2017 1001    No results found for: SPEP, UPEP  Lab Results  Component Value Date   WBC 7.7 11/25/2019   NEUTROABS 6.0 11/25/2019   HGB 12.8 (L) 11/25/2019   HCT 38.6 (L) 11/25/2019   MCV 95.5 11/25/2019   PLT 194 11/25/2019      Chemistry      Component Value Date/Time   NA 134 (L) 11/25/2019 1412   NA 140 07/13/2013 1542   K 3.8 11/25/2019 1412   K 4.3 07/13/2013 1542   CL 96 (L) 11/25/2019 1412   CL 107 07/13/2013 1542   CO2 26 11/25/2019 1412   CO2 26 07/13/2013 1542   BUN 27 (H) 11/25/2019 1412   BUN 11 07/13/2013 1542    CREATININE 0.78 11/25/2019 1412   CREATININE 0.76 10/15/2017 1001      Component Value Date/Time   CALCIUM 9.2 11/25/2019 1412   CALCIUM 9.9 07/13/2013 1542   ALKPHOS 46 11/25/2019 1412   ALKPHOS 74 07/13/2013 1542   AST 18 11/25/2019 1412   AST 26 07/13/2013 1542   ALT 26 11/25/2019 1412   ALT 32 07/13/2013 1542   BILITOT 0.7 11/25/2019 1412   BILITOT 0.4 07/13/2013 1542     RADIOGRAPHIC STUDIES: I have personally reviewed the radiological images as listed and agreed with the findings in the report. No results found.   ASSESSMENT & PLAN:  Prostate cancer metastatic to bone ([place]) # Castrate resistant prostate cancer metastases to bone. ADT;  MAY 2021- PET- Improved; however PSA- rising.  Most recently on cabazitaxel s/p #2 [80% dose]- poorly tolerated [see below];  # Clinically stable; continue off chemotherapy; continue hospice.  #Fatigue  STABLE;  hold IV fluids and dexamethasone. continue oral dexamethasone 2 mg p.o. twice a day [taper]  # Malignancy related pain- continue fentanyl patch hydrocodone;. Continue fentanyl patch to every 48 hours and continue oxycodone prn; STABLE.  Refilled fentanyl 100 mcg.  *Patient preference/follow-up # DISPOSITION: # 31month- MD; labs- cbc/cmp;PSA-Dr.B      No orders of the defined types  were placed in this encounter.  All questions were answered. The patient knows to call the clinic with any problems, questions or concerns.      [name], MD 11/25/2019 3:11 PM

## 2019-11-26 DIAGNOSIS — I10 Essential (primary) hypertension: Secondary | ICD-10-CM | POA: Diagnosis not present

## 2019-11-26 DIAGNOSIS — R634 Abnormal weight loss: Secondary | ICD-10-CM | POA: Diagnosis not present

## 2019-11-26 DIAGNOSIS — R630 Anorexia: Secondary | ICD-10-CM | POA: Diagnosis not present

## 2019-11-26 DIAGNOSIS — G620 Drug-induced polyneuropathy: Secondary | ICD-10-CM | POA: Diagnosis not present

## 2019-11-26 DIAGNOSIS — E785 Hyperlipidemia, unspecified: Secondary | ICD-10-CM | POA: Diagnosis not present

## 2019-11-26 DIAGNOSIS — I252 Old myocardial infarction: Secondary | ICD-10-CM | POA: Diagnosis not present

## 2019-11-26 DIAGNOSIS — C7951 Secondary malignant neoplasm of bone: Secondary | ICD-10-CM | POA: Diagnosis not present

## 2019-11-26 DIAGNOSIS — I25119 Atherosclerotic heart disease of native coronary artery with unspecified angina pectoris: Secondary | ICD-10-CM | POA: Diagnosis not present

## 2019-11-26 DIAGNOSIS — C61 Malignant neoplasm of prostate: Secondary | ICD-10-CM | POA: Diagnosis not present

## 2019-11-26 LAB — PSA: Prostatic Specific Antigen: 494 ng/mL — ABNORMAL HIGH (ref 0.00–4.00)

## 2019-11-27 DIAGNOSIS — I10 Essential (primary) hypertension: Secondary | ICD-10-CM | POA: Diagnosis not present

## 2019-11-27 DIAGNOSIS — I25119 Atherosclerotic heart disease of native coronary artery with unspecified angina pectoris: Secondary | ICD-10-CM | POA: Diagnosis not present

## 2019-11-27 DIAGNOSIS — C61 Malignant neoplasm of prostate: Secondary | ICD-10-CM | POA: Diagnosis not present

## 2019-11-27 DIAGNOSIS — G620 Drug-induced polyneuropathy: Secondary | ICD-10-CM | POA: Diagnosis not present

## 2019-11-27 DIAGNOSIS — E785 Hyperlipidemia, unspecified: Secondary | ICD-10-CM | POA: Diagnosis not present

## 2019-11-27 DIAGNOSIS — R634 Abnormal weight loss: Secondary | ICD-10-CM | POA: Diagnosis not present

## 2019-11-27 DIAGNOSIS — R630 Anorexia: Secondary | ICD-10-CM | POA: Diagnosis not present

## 2019-11-27 DIAGNOSIS — I252 Old myocardial infarction: Secondary | ICD-10-CM | POA: Diagnosis not present

## 2019-11-27 DIAGNOSIS — C7951 Secondary malignant neoplasm of bone: Secondary | ICD-10-CM | POA: Diagnosis not present

## 2019-11-28 DIAGNOSIS — I252 Old myocardial infarction: Secondary | ICD-10-CM | POA: Diagnosis not present

## 2019-11-28 DIAGNOSIS — E785 Hyperlipidemia, unspecified: Secondary | ICD-10-CM | POA: Diagnosis not present

## 2019-11-28 DIAGNOSIS — I25119 Atherosclerotic heart disease of native coronary artery with unspecified angina pectoris: Secondary | ICD-10-CM | POA: Diagnosis not present

## 2019-11-28 DIAGNOSIS — G620 Drug-induced polyneuropathy: Secondary | ICD-10-CM | POA: Diagnosis not present

## 2019-11-28 DIAGNOSIS — R630 Anorexia: Secondary | ICD-10-CM | POA: Diagnosis not present

## 2019-11-28 DIAGNOSIS — C7951 Secondary malignant neoplasm of bone: Secondary | ICD-10-CM | POA: Diagnosis not present

## 2019-11-28 DIAGNOSIS — C61 Malignant neoplasm of prostate: Secondary | ICD-10-CM | POA: Diagnosis not present

## 2019-11-28 DIAGNOSIS — R634 Abnormal weight loss: Secondary | ICD-10-CM | POA: Diagnosis not present

## 2019-11-28 DIAGNOSIS — I10 Essential (primary) hypertension: Secondary | ICD-10-CM | POA: Diagnosis not present

## 2019-11-29 DIAGNOSIS — R634 Abnormal weight loss: Secondary | ICD-10-CM | POA: Diagnosis not present

## 2019-11-29 DIAGNOSIS — I10 Essential (primary) hypertension: Secondary | ICD-10-CM | POA: Diagnosis not present

## 2019-11-29 DIAGNOSIS — I25119 Atherosclerotic heart disease of native coronary artery with unspecified angina pectoris: Secondary | ICD-10-CM | POA: Diagnosis not present

## 2019-11-29 DIAGNOSIS — C61 Malignant neoplasm of prostate: Secondary | ICD-10-CM | POA: Diagnosis not present

## 2019-11-29 DIAGNOSIS — R630 Anorexia: Secondary | ICD-10-CM | POA: Diagnosis not present

## 2019-11-29 DIAGNOSIS — I252 Old myocardial infarction: Secondary | ICD-10-CM | POA: Diagnosis not present

## 2019-11-29 DIAGNOSIS — G620 Drug-induced polyneuropathy: Secondary | ICD-10-CM | POA: Diagnosis not present

## 2019-11-29 DIAGNOSIS — C7951 Secondary malignant neoplasm of bone: Secondary | ICD-10-CM | POA: Diagnosis not present

## 2019-11-29 DIAGNOSIS — E785 Hyperlipidemia, unspecified: Secondary | ICD-10-CM | POA: Diagnosis not present

## 2019-11-30 DIAGNOSIS — G620 Drug-induced polyneuropathy: Secondary | ICD-10-CM | POA: Diagnosis not present

## 2019-11-30 DIAGNOSIS — I10 Essential (primary) hypertension: Secondary | ICD-10-CM | POA: Diagnosis not present

## 2019-11-30 DIAGNOSIS — E785 Hyperlipidemia, unspecified: Secondary | ICD-10-CM | POA: Diagnosis not present

## 2019-11-30 DIAGNOSIS — C7951 Secondary malignant neoplasm of bone: Secondary | ICD-10-CM | POA: Diagnosis not present

## 2019-11-30 DIAGNOSIS — C61 Malignant neoplasm of prostate: Secondary | ICD-10-CM | POA: Diagnosis not present

## 2019-11-30 DIAGNOSIS — R630 Anorexia: Secondary | ICD-10-CM | POA: Diagnosis not present

## 2019-11-30 DIAGNOSIS — I25119 Atherosclerotic heart disease of native coronary artery with unspecified angina pectoris: Secondary | ICD-10-CM | POA: Diagnosis not present

## 2019-11-30 DIAGNOSIS — R634 Abnormal weight loss: Secondary | ICD-10-CM | POA: Diagnosis not present

## 2019-11-30 DIAGNOSIS — I252 Old myocardial infarction: Secondary | ICD-10-CM | POA: Diagnosis not present

## 2019-12-01 DIAGNOSIS — I252 Old myocardial infarction: Secondary | ICD-10-CM | POA: Diagnosis not present

## 2019-12-01 DIAGNOSIS — G620 Drug-induced polyneuropathy: Secondary | ICD-10-CM | POA: Diagnosis not present

## 2019-12-01 DIAGNOSIS — R630 Anorexia: Secondary | ICD-10-CM | POA: Diagnosis not present

## 2019-12-01 DIAGNOSIS — I10 Essential (primary) hypertension: Secondary | ICD-10-CM | POA: Diagnosis not present

## 2019-12-01 DIAGNOSIS — R634 Abnormal weight loss: Secondary | ICD-10-CM | POA: Diagnosis not present

## 2019-12-01 DIAGNOSIS — C7951 Secondary malignant neoplasm of bone: Secondary | ICD-10-CM | POA: Diagnosis not present

## 2019-12-01 DIAGNOSIS — E785 Hyperlipidemia, unspecified: Secondary | ICD-10-CM | POA: Diagnosis not present

## 2019-12-01 DIAGNOSIS — I25119 Atherosclerotic heart disease of native coronary artery with unspecified angina pectoris: Secondary | ICD-10-CM | POA: Diagnosis not present

## 2019-12-01 DIAGNOSIS — C61 Malignant neoplasm of prostate: Secondary | ICD-10-CM | POA: Diagnosis not present

## 2019-12-02 ENCOUNTER — Other Ambulatory Visit: Payer: Self-pay | Admitting: Internal Medicine

## 2019-12-02 DIAGNOSIS — I252 Old myocardial infarction: Secondary | ICD-10-CM | POA: Diagnosis not present

## 2019-12-02 DIAGNOSIS — I25119 Atherosclerotic heart disease of native coronary artery with unspecified angina pectoris: Secondary | ICD-10-CM | POA: Diagnosis not present

## 2019-12-02 DIAGNOSIS — E785 Hyperlipidemia, unspecified: Secondary | ICD-10-CM | POA: Diagnosis not present

## 2019-12-02 DIAGNOSIS — C61 Malignant neoplasm of prostate: Secondary | ICD-10-CM | POA: Diagnosis not present

## 2019-12-02 DIAGNOSIS — R630 Anorexia: Secondary | ICD-10-CM | POA: Diagnosis not present

## 2019-12-02 DIAGNOSIS — R634 Abnormal weight loss: Secondary | ICD-10-CM | POA: Diagnosis not present

## 2019-12-02 DIAGNOSIS — C7951 Secondary malignant neoplasm of bone: Secondary | ICD-10-CM | POA: Diagnosis not present

## 2019-12-02 DIAGNOSIS — G620 Drug-induced polyneuropathy: Secondary | ICD-10-CM | POA: Diagnosis not present

## 2019-12-02 DIAGNOSIS — I10 Essential (primary) hypertension: Secondary | ICD-10-CM | POA: Diagnosis not present

## 2019-12-02 MED ORDER — OXYCODONE HCL 15 MG PO TABS: 15.0000 mg | ORAL_TABLET | Freq: Four times a day (QID) | ORAL | 0 refills | Status: DC | PRN

## 2019-12-03 DIAGNOSIS — C7951 Secondary malignant neoplasm of bone: Secondary | ICD-10-CM | POA: Diagnosis not present

## 2019-12-03 DIAGNOSIS — C61 Malignant neoplasm of prostate: Secondary | ICD-10-CM | POA: Diagnosis not present

## 2019-12-03 DIAGNOSIS — I10 Essential (primary) hypertension: Secondary | ICD-10-CM | POA: Diagnosis not present

## 2019-12-03 DIAGNOSIS — R634 Abnormal weight loss: Secondary | ICD-10-CM | POA: Diagnosis not present

## 2019-12-03 DIAGNOSIS — E785 Hyperlipidemia, unspecified: Secondary | ICD-10-CM | POA: Diagnosis not present

## 2019-12-03 DIAGNOSIS — G620 Drug-induced polyneuropathy: Secondary | ICD-10-CM | POA: Diagnosis not present

## 2019-12-03 DIAGNOSIS — R630 Anorexia: Secondary | ICD-10-CM | POA: Diagnosis not present

## 2019-12-03 DIAGNOSIS — I25119 Atherosclerotic heart disease of native coronary artery with unspecified angina pectoris: Secondary | ICD-10-CM | POA: Diagnosis not present

## 2019-12-03 DIAGNOSIS — I252 Old myocardial infarction: Secondary | ICD-10-CM | POA: Diagnosis not present

## 2019-12-04 DIAGNOSIS — E785 Hyperlipidemia, unspecified: Secondary | ICD-10-CM | POA: Diagnosis not present

## 2019-12-04 DIAGNOSIS — R630 Anorexia: Secondary | ICD-10-CM | POA: Diagnosis not present

## 2019-12-04 DIAGNOSIS — C61 Malignant neoplasm of prostate: Secondary | ICD-10-CM | POA: Diagnosis not present

## 2019-12-04 DIAGNOSIS — R634 Abnormal weight loss: Secondary | ICD-10-CM | POA: Diagnosis not present

## 2019-12-04 DIAGNOSIS — C7951 Secondary malignant neoplasm of bone: Secondary | ICD-10-CM | POA: Diagnosis not present

## 2019-12-04 DIAGNOSIS — G620 Drug-induced polyneuropathy: Secondary | ICD-10-CM | POA: Diagnosis not present

## 2019-12-04 DIAGNOSIS — I25119 Atherosclerotic heart disease of native coronary artery with unspecified angina pectoris: Secondary | ICD-10-CM | POA: Diagnosis not present

## 2019-12-04 DIAGNOSIS — I252 Old myocardial infarction: Secondary | ICD-10-CM | POA: Diagnosis not present

## 2019-12-04 DIAGNOSIS — I10 Essential (primary) hypertension: Secondary | ICD-10-CM | POA: Diagnosis not present

## 2019-12-05 DIAGNOSIS — E785 Hyperlipidemia, unspecified: Secondary | ICD-10-CM | POA: Diagnosis not present

## 2019-12-05 DIAGNOSIS — C7951 Secondary malignant neoplasm of bone: Secondary | ICD-10-CM | POA: Diagnosis not present

## 2019-12-05 DIAGNOSIS — R630 Anorexia: Secondary | ICD-10-CM | POA: Diagnosis not present

## 2019-12-05 DIAGNOSIS — G620 Drug-induced polyneuropathy: Secondary | ICD-10-CM | POA: Diagnosis not present

## 2019-12-05 DIAGNOSIS — R634 Abnormal weight loss: Secondary | ICD-10-CM | POA: Diagnosis not present

## 2019-12-05 DIAGNOSIS — I25119 Atherosclerotic heart disease of native coronary artery with unspecified angina pectoris: Secondary | ICD-10-CM | POA: Diagnosis not present

## 2019-12-05 DIAGNOSIS — I252 Old myocardial infarction: Secondary | ICD-10-CM | POA: Diagnosis not present

## 2019-12-05 DIAGNOSIS — I10 Essential (primary) hypertension: Secondary | ICD-10-CM | POA: Diagnosis not present

## 2019-12-05 DIAGNOSIS — C61 Malignant neoplasm of prostate: Secondary | ICD-10-CM | POA: Diagnosis not present

## 2019-12-06 DIAGNOSIS — R630 Anorexia: Secondary | ICD-10-CM | POA: Diagnosis not present

## 2019-12-06 DIAGNOSIS — I25119 Atherosclerotic heart disease of native coronary artery with unspecified angina pectoris: Secondary | ICD-10-CM | POA: Diagnosis not present

## 2019-12-06 DIAGNOSIS — I252 Old myocardial infarction: Secondary | ICD-10-CM | POA: Diagnosis not present

## 2019-12-06 DIAGNOSIS — E785 Hyperlipidemia, unspecified: Secondary | ICD-10-CM | POA: Diagnosis not present

## 2019-12-06 DIAGNOSIS — C61 Malignant neoplasm of prostate: Secondary | ICD-10-CM | POA: Diagnosis not present

## 2019-12-06 DIAGNOSIS — I10 Essential (primary) hypertension: Secondary | ICD-10-CM | POA: Diagnosis not present

## 2019-12-06 DIAGNOSIS — C7951 Secondary malignant neoplasm of bone: Secondary | ICD-10-CM | POA: Diagnosis not present

## 2019-12-06 DIAGNOSIS — G620 Drug-induced polyneuropathy: Secondary | ICD-10-CM | POA: Diagnosis not present

## 2019-12-06 DIAGNOSIS — R634 Abnormal weight loss: Secondary | ICD-10-CM | POA: Diagnosis not present

## 2019-12-07 DIAGNOSIS — R634 Abnormal weight loss: Secondary | ICD-10-CM | POA: Diagnosis not present

## 2019-12-07 DIAGNOSIS — I25119 Atherosclerotic heart disease of native coronary artery with unspecified angina pectoris: Secondary | ICD-10-CM | POA: Diagnosis not present

## 2019-12-07 DIAGNOSIS — I10 Essential (primary) hypertension: Secondary | ICD-10-CM | POA: Diagnosis not present

## 2019-12-07 DIAGNOSIS — R630 Anorexia: Secondary | ICD-10-CM | POA: Diagnosis not present

## 2019-12-07 DIAGNOSIS — I252 Old myocardial infarction: Secondary | ICD-10-CM | POA: Diagnosis not present

## 2019-12-07 DIAGNOSIS — C7951 Secondary malignant neoplasm of bone: Secondary | ICD-10-CM | POA: Diagnosis not present

## 2019-12-07 DIAGNOSIS — G620 Drug-induced polyneuropathy: Secondary | ICD-10-CM | POA: Diagnosis not present

## 2019-12-07 DIAGNOSIS — E785 Hyperlipidemia, unspecified: Secondary | ICD-10-CM | POA: Diagnosis not present

## 2019-12-07 DIAGNOSIS — C61 Malignant neoplasm of prostate: Secondary | ICD-10-CM | POA: Diagnosis not present

## 2019-12-08 DIAGNOSIS — E785 Hyperlipidemia, unspecified: Secondary | ICD-10-CM | POA: Diagnosis not present

## 2019-12-08 DIAGNOSIS — C7951 Secondary malignant neoplasm of bone: Secondary | ICD-10-CM | POA: Diagnosis not present

## 2019-12-08 DIAGNOSIS — R634 Abnormal weight loss: Secondary | ICD-10-CM | POA: Diagnosis not present

## 2019-12-08 DIAGNOSIS — I252 Old myocardial infarction: Secondary | ICD-10-CM | POA: Diagnosis not present

## 2019-12-08 DIAGNOSIS — I10 Essential (primary) hypertension: Secondary | ICD-10-CM | POA: Diagnosis not present

## 2019-12-08 DIAGNOSIS — R630 Anorexia: Secondary | ICD-10-CM | POA: Diagnosis not present

## 2019-12-08 DIAGNOSIS — C61 Malignant neoplasm of prostate: Secondary | ICD-10-CM | POA: Diagnosis not present

## 2019-12-08 DIAGNOSIS — I25119 Atherosclerotic heart disease of native coronary artery with unspecified angina pectoris: Secondary | ICD-10-CM | POA: Diagnosis not present

## 2019-12-08 DIAGNOSIS — G620 Drug-induced polyneuropathy: Secondary | ICD-10-CM | POA: Diagnosis not present

## 2019-12-09 DIAGNOSIS — I252 Old myocardial infarction: Secondary | ICD-10-CM | POA: Diagnosis not present

## 2019-12-09 DIAGNOSIS — C61 Malignant neoplasm of prostate: Secondary | ICD-10-CM | POA: Diagnosis not present

## 2019-12-09 DIAGNOSIS — I25119 Atherosclerotic heart disease of native coronary artery with unspecified angina pectoris: Secondary | ICD-10-CM | POA: Diagnosis not present

## 2019-12-09 DIAGNOSIS — C7951 Secondary malignant neoplasm of bone: Secondary | ICD-10-CM | POA: Diagnosis not present

## 2019-12-09 DIAGNOSIS — R634 Abnormal weight loss: Secondary | ICD-10-CM | POA: Diagnosis not present

## 2019-12-09 DIAGNOSIS — E785 Hyperlipidemia, unspecified: Secondary | ICD-10-CM | POA: Diagnosis not present

## 2019-12-09 DIAGNOSIS — R630 Anorexia: Secondary | ICD-10-CM | POA: Diagnosis not present

## 2019-12-09 DIAGNOSIS — I10 Essential (primary) hypertension: Secondary | ICD-10-CM | POA: Diagnosis not present

## 2019-12-09 DIAGNOSIS — G620 Drug-induced polyneuropathy: Secondary | ICD-10-CM | POA: Diagnosis not present

## 2019-12-10 DIAGNOSIS — C7951 Secondary malignant neoplasm of bone: Secondary | ICD-10-CM | POA: Diagnosis not present

## 2019-12-10 DIAGNOSIS — I252 Old myocardial infarction: Secondary | ICD-10-CM | POA: Diagnosis not present

## 2019-12-10 DIAGNOSIS — R634 Abnormal weight loss: Secondary | ICD-10-CM | POA: Diagnosis not present

## 2019-12-10 DIAGNOSIS — I10 Essential (primary) hypertension: Secondary | ICD-10-CM | POA: Diagnosis not present

## 2019-12-10 DIAGNOSIS — I25119 Atherosclerotic heart disease of native coronary artery with unspecified angina pectoris: Secondary | ICD-10-CM | POA: Diagnosis not present

## 2019-12-10 DIAGNOSIS — E785 Hyperlipidemia, unspecified: Secondary | ICD-10-CM | POA: Diagnosis not present

## 2019-12-10 DIAGNOSIS — G620 Drug-induced polyneuropathy: Secondary | ICD-10-CM | POA: Diagnosis not present

## 2019-12-10 DIAGNOSIS — R630 Anorexia: Secondary | ICD-10-CM | POA: Diagnosis not present

## 2019-12-10 DIAGNOSIS — C61 Malignant neoplasm of prostate: Secondary | ICD-10-CM | POA: Diagnosis not present

## 2019-12-11 ENCOUNTER — Other Ambulatory Visit: Payer: Self-pay | Admitting: Internal Medicine

## 2019-12-11 DIAGNOSIS — R634 Abnormal weight loss: Secondary | ICD-10-CM | POA: Diagnosis not present

## 2019-12-11 DIAGNOSIS — E785 Hyperlipidemia, unspecified: Secondary | ICD-10-CM | POA: Diagnosis not present

## 2019-12-11 DIAGNOSIS — G620 Drug-induced polyneuropathy: Secondary | ICD-10-CM | POA: Diagnosis not present

## 2019-12-11 DIAGNOSIS — C61 Malignant neoplasm of prostate: Secondary | ICD-10-CM | POA: Diagnosis not present

## 2019-12-11 DIAGNOSIS — I252 Old myocardial infarction: Secondary | ICD-10-CM | POA: Diagnosis not present

## 2019-12-11 DIAGNOSIS — I25119 Atherosclerotic heart disease of native coronary artery with unspecified angina pectoris: Secondary | ICD-10-CM | POA: Diagnosis not present

## 2019-12-11 DIAGNOSIS — I10 Essential (primary) hypertension: Secondary | ICD-10-CM | POA: Diagnosis not present

## 2019-12-11 DIAGNOSIS — R630 Anorexia: Secondary | ICD-10-CM | POA: Diagnosis not present

## 2019-12-11 DIAGNOSIS — C7951 Secondary malignant neoplasm of bone: Secondary | ICD-10-CM | POA: Diagnosis not present

## 2019-12-12 DIAGNOSIS — R634 Abnormal weight loss: Secondary | ICD-10-CM | POA: Diagnosis not present

## 2019-12-12 DIAGNOSIS — I25119 Atherosclerotic heart disease of native coronary artery with unspecified angina pectoris: Secondary | ICD-10-CM | POA: Diagnosis not present

## 2019-12-12 DIAGNOSIS — C7951 Secondary malignant neoplasm of bone: Secondary | ICD-10-CM | POA: Diagnosis not present

## 2019-12-12 DIAGNOSIS — I252 Old myocardial infarction: Secondary | ICD-10-CM | POA: Diagnosis not present

## 2019-12-12 DIAGNOSIS — R630 Anorexia: Secondary | ICD-10-CM | POA: Diagnosis not present

## 2019-12-12 DIAGNOSIS — I10 Essential (primary) hypertension: Secondary | ICD-10-CM | POA: Diagnosis not present

## 2019-12-12 DIAGNOSIS — E785 Hyperlipidemia, unspecified: Secondary | ICD-10-CM | POA: Diagnosis not present

## 2019-12-12 DIAGNOSIS — C61 Malignant neoplasm of prostate: Secondary | ICD-10-CM | POA: Diagnosis not present

## 2019-12-12 DIAGNOSIS — G620 Drug-induced polyneuropathy: Secondary | ICD-10-CM | POA: Diagnosis not present

## 2019-12-13 DIAGNOSIS — E785 Hyperlipidemia, unspecified: Secondary | ICD-10-CM | POA: Diagnosis not present

## 2019-12-13 DIAGNOSIS — R630 Anorexia: Secondary | ICD-10-CM | POA: Diagnosis not present

## 2019-12-13 DIAGNOSIS — G620 Drug-induced polyneuropathy: Secondary | ICD-10-CM | POA: Diagnosis not present

## 2019-12-13 DIAGNOSIS — I252 Old myocardial infarction: Secondary | ICD-10-CM | POA: Diagnosis not present

## 2019-12-13 DIAGNOSIS — I25119 Atherosclerotic heart disease of native coronary artery with unspecified angina pectoris: Secondary | ICD-10-CM | POA: Diagnosis not present

## 2019-12-13 DIAGNOSIS — C7951 Secondary malignant neoplasm of bone: Secondary | ICD-10-CM | POA: Diagnosis not present

## 2019-12-13 DIAGNOSIS — R634 Abnormal weight loss: Secondary | ICD-10-CM | POA: Diagnosis not present

## 2019-12-13 DIAGNOSIS — I10 Essential (primary) hypertension: Secondary | ICD-10-CM | POA: Diagnosis not present

## 2019-12-13 DIAGNOSIS — C61 Malignant neoplasm of prostate: Secondary | ICD-10-CM | POA: Diagnosis not present

## 2019-12-14 DIAGNOSIS — R630 Anorexia: Secondary | ICD-10-CM | POA: Diagnosis not present

## 2019-12-14 DIAGNOSIS — R634 Abnormal weight loss: Secondary | ICD-10-CM | POA: Diagnosis not present

## 2019-12-14 DIAGNOSIS — I25119 Atherosclerotic heart disease of native coronary artery with unspecified angina pectoris: Secondary | ICD-10-CM | POA: Diagnosis not present

## 2019-12-14 DIAGNOSIS — C61 Malignant neoplasm of prostate: Secondary | ICD-10-CM | POA: Diagnosis not present

## 2019-12-14 DIAGNOSIS — E785 Hyperlipidemia, unspecified: Secondary | ICD-10-CM | POA: Diagnosis not present

## 2019-12-14 DIAGNOSIS — I252 Old myocardial infarction: Secondary | ICD-10-CM | POA: Diagnosis not present

## 2019-12-14 DIAGNOSIS — I10 Essential (primary) hypertension: Secondary | ICD-10-CM | POA: Diagnosis not present

## 2019-12-14 DIAGNOSIS — C7951 Secondary malignant neoplasm of bone: Secondary | ICD-10-CM | POA: Diagnosis not present

## 2019-12-14 DIAGNOSIS — G620 Drug-induced polyneuropathy: Secondary | ICD-10-CM | POA: Diagnosis not present

## 2019-12-15 DIAGNOSIS — I252 Old myocardial infarction: Secondary | ICD-10-CM | POA: Diagnosis not present

## 2019-12-15 DIAGNOSIS — C7951 Secondary malignant neoplasm of bone: Secondary | ICD-10-CM | POA: Diagnosis not present

## 2019-12-15 DIAGNOSIS — R630 Anorexia: Secondary | ICD-10-CM | POA: Diagnosis not present

## 2019-12-15 DIAGNOSIS — I25119 Atherosclerotic heart disease of native coronary artery with unspecified angina pectoris: Secondary | ICD-10-CM | POA: Diagnosis not present

## 2019-12-15 DIAGNOSIS — C61 Malignant neoplasm of prostate: Secondary | ICD-10-CM | POA: Diagnosis not present

## 2019-12-15 DIAGNOSIS — I10 Essential (primary) hypertension: Secondary | ICD-10-CM | POA: Diagnosis not present

## 2019-12-15 DIAGNOSIS — R634 Abnormal weight loss: Secondary | ICD-10-CM | POA: Diagnosis not present

## 2019-12-15 DIAGNOSIS — G620 Drug-induced polyneuropathy: Secondary | ICD-10-CM | POA: Diagnosis not present

## 2019-12-15 DIAGNOSIS — E785 Hyperlipidemia, unspecified: Secondary | ICD-10-CM | POA: Diagnosis not present

## 2019-12-16 DIAGNOSIS — C61 Malignant neoplasm of prostate: Secondary | ICD-10-CM | POA: Diagnosis not present

## 2019-12-16 DIAGNOSIS — R634 Abnormal weight loss: Secondary | ICD-10-CM | POA: Diagnosis not present

## 2019-12-16 DIAGNOSIS — C7951 Secondary malignant neoplasm of bone: Secondary | ICD-10-CM | POA: Diagnosis not present

## 2019-12-16 DIAGNOSIS — I10 Essential (primary) hypertension: Secondary | ICD-10-CM | POA: Diagnosis not present

## 2019-12-16 DIAGNOSIS — G620 Drug-induced polyneuropathy: Secondary | ICD-10-CM | POA: Diagnosis not present

## 2019-12-16 DIAGNOSIS — E785 Hyperlipidemia, unspecified: Secondary | ICD-10-CM | POA: Diagnosis not present

## 2019-12-16 DIAGNOSIS — I252 Old myocardial infarction: Secondary | ICD-10-CM | POA: Diagnosis not present

## 2019-12-16 DIAGNOSIS — R630 Anorexia: Secondary | ICD-10-CM | POA: Diagnosis not present

## 2019-12-16 DIAGNOSIS — I25119 Atherosclerotic heart disease of native coronary artery with unspecified angina pectoris: Secondary | ICD-10-CM | POA: Diagnosis not present

## 2019-12-17 DIAGNOSIS — R634 Abnormal weight loss: Secondary | ICD-10-CM | POA: Diagnosis not present

## 2019-12-17 DIAGNOSIS — I252 Old myocardial infarction: Secondary | ICD-10-CM | POA: Diagnosis not present

## 2019-12-17 DIAGNOSIS — E785 Hyperlipidemia, unspecified: Secondary | ICD-10-CM | POA: Diagnosis not present

## 2019-12-17 DIAGNOSIS — I10 Essential (primary) hypertension: Secondary | ICD-10-CM | POA: Diagnosis not present

## 2019-12-17 DIAGNOSIS — R630 Anorexia: Secondary | ICD-10-CM | POA: Diagnosis not present

## 2019-12-17 DIAGNOSIS — C7951 Secondary malignant neoplasm of bone: Secondary | ICD-10-CM | POA: Diagnosis not present

## 2019-12-17 DIAGNOSIS — I25119 Atherosclerotic heart disease of native coronary artery with unspecified angina pectoris: Secondary | ICD-10-CM | POA: Diagnosis not present

## 2019-12-17 DIAGNOSIS — G620 Drug-induced polyneuropathy: Secondary | ICD-10-CM | POA: Diagnosis not present

## 2019-12-17 DIAGNOSIS — C61 Malignant neoplasm of prostate: Secondary | ICD-10-CM | POA: Diagnosis not present

## 2019-12-18 ENCOUNTER — Encounter: Payer: Self-pay | Admitting: Internal Medicine

## 2019-12-18 ENCOUNTER — Other Ambulatory Visit: Payer: Self-pay | Admitting: *Deleted

## 2019-12-18 DIAGNOSIS — C61 Malignant neoplasm of prostate: Secondary | ICD-10-CM

## 2019-12-18 DIAGNOSIS — G620 Drug-induced polyneuropathy: Secondary | ICD-10-CM | POA: Diagnosis not present

## 2019-12-18 DIAGNOSIS — I10 Essential (primary) hypertension: Secondary | ICD-10-CM | POA: Diagnosis not present

## 2019-12-18 DIAGNOSIS — C7951 Secondary malignant neoplasm of bone: Secondary | ICD-10-CM | POA: Diagnosis not present

## 2019-12-18 DIAGNOSIS — I252 Old myocardial infarction: Secondary | ICD-10-CM | POA: Diagnosis not present

## 2019-12-18 DIAGNOSIS — I25119 Atherosclerotic heart disease of native coronary artery with unspecified angina pectoris: Secondary | ICD-10-CM | POA: Diagnosis not present

## 2019-12-18 DIAGNOSIS — R630 Anorexia: Secondary | ICD-10-CM | POA: Diagnosis not present

## 2019-12-18 DIAGNOSIS — E785 Hyperlipidemia, unspecified: Secondary | ICD-10-CM | POA: Diagnosis not present

## 2019-12-18 DIAGNOSIS — R634 Abnormal weight loss: Secondary | ICD-10-CM | POA: Diagnosis not present

## 2019-12-18 DIAGNOSIS — G893 Neoplasm related pain (acute) (chronic): Secondary | ICD-10-CM

## 2019-12-18 MED ORDER — FENTANYL 100 MCG/HR TD PT72
1.0000 | MEDICATED_PATCH | TRANSDERMAL | 0 refills | Status: DC
Start: 2019-12-18 — End: 2020-01-19

## 2019-12-18 MED ORDER — FENTANYL 50 MCG/HR TD PT72: 1.0000 | MEDICATED_PATCH | TRANSDERMAL | 0 refills | Status: DC

## 2019-12-18 NOTE — Telephone Encounter (Signed)
Patient sent mychart message to request RF for Fentanyl patches

## 2019-12-19 DIAGNOSIS — I10 Essential (primary) hypertension: Secondary | ICD-10-CM | POA: Diagnosis not present

## 2019-12-19 DIAGNOSIS — R634 Abnormal weight loss: Secondary | ICD-10-CM | POA: Diagnosis not present

## 2019-12-19 DIAGNOSIS — C7951 Secondary malignant neoplasm of bone: Secondary | ICD-10-CM | POA: Diagnosis not present

## 2019-12-19 DIAGNOSIS — I25119 Atherosclerotic heart disease of native coronary artery with unspecified angina pectoris: Secondary | ICD-10-CM | POA: Diagnosis not present

## 2019-12-19 DIAGNOSIS — E785 Hyperlipidemia, unspecified: Secondary | ICD-10-CM | POA: Diagnosis not present

## 2019-12-19 DIAGNOSIS — R630 Anorexia: Secondary | ICD-10-CM | POA: Diagnosis not present

## 2019-12-19 DIAGNOSIS — C61 Malignant neoplasm of prostate: Secondary | ICD-10-CM | POA: Diagnosis not present

## 2019-12-19 DIAGNOSIS — I252 Old myocardial infarction: Secondary | ICD-10-CM | POA: Diagnosis not present

## 2019-12-19 DIAGNOSIS — G620 Drug-induced polyneuropathy: Secondary | ICD-10-CM | POA: Diagnosis not present

## 2019-12-20 DIAGNOSIS — I10 Essential (primary) hypertension: Secondary | ICD-10-CM | POA: Diagnosis not present

## 2019-12-20 DIAGNOSIS — I25119 Atherosclerotic heart disease of native coronary artery with unspecified angina pectoris: Secondary | ICD-10-CM | POA: Diagnosis not present

## 2019-12-20 DIAGNOSIS — C7951 Secondary malignant neoplasm of bone: Secondary | ICD-10-CM | POA: Diagnosis not present

## 2019-12-20 DIAGNOSIS — G620 Drug-induced polyneuropathy: Secondary | ICD-10-CM | POA: Diagnosis not present

## 2019-12-20 DIAGNOSIS — R634 Abnormal weight loss: Secondary | ICD-10-CM | POA: Diagnosis not present

## 2019-12-20 DIAGNOSIS — C61 Malignant neoplasm of prostate: Secondary | ICD-10-CM | POA: Diagnosis not present

## 2019-12-20 DIAGNOSIS — R630 Anorexia: Secondary | ICD-10-CM | POA: Diagnosis not present

## 2019-12-20 DIAGNOSIS — I252 Old myocardial infarction: Secondary | ICD-10-CM | POA: Diagnosis not present

## 2019-12-20 DIAGNOSIS — E785 Hyperlipidemia, unspecified: Secondary | ICD-10-CM | POA: Diagnosis not present

## 2019-12-21 DIAGNOSIS — I252 Old myocardial infarction: Secondary | ICD-10-CM | POA: Diagnosis not present

## 2019-12-21 DIAGNOSIS — I10 Essential (primary) hypertension: Secondary | ICD-10-CM | POA: Diagnosis not present

## 2019-12-21 DIAGNOSIS — R634 Abnormal weight loss: Secondary | ICD-10-CM | POA: Diagnosis not present

## 2019-12-21 DIAGNOSIS — G620 Drug-induced polyneuropathy: Secondary | ICD-10-CM | POA: Diagnosis not present

## 2019-12-21 DIAGNOSIS — E785 Hyperlipidemia, unspecified: Secondary | ICD-10-CM | POA: Diagnosis not present

## 2019-12-21 DIAGNOSIS — C61 Malignant neoplasm of prostate: Secondary | ICD-10-CM | POA: Diagnosis not present

## 2019-12-21 DIAGNOSIS — C7951 Secondary malignant neoplasm of bone: Secondary | ICD-10-CM | POA: Diagnosis not present

## 2019-12-21 DIAGNOSIS — I25119 Atherosclerotic heart disease of native coronary artery with unspecified angina pectoris: Secondary | ICD-10-CM | POA: Diagnosis not present

## 2019-12-21 DIAGNOSIS — R630 Anorexia: Secondary | ICD-10-CM | POA: Diagnosis not present

## 2019-12-22 DIAGNOSIS — C61 Malignant neoplasm of prostate: Secondary | ICD-10-CM | POA: Diagnosis not present

## 2019-12-22 DIAGNOSIS — I252 Old myocardial infarction: Secondary | ICD-10-CM | POA: Diagnosis not present

## 2019-12-22 DIAGNOSIS — E785 Hyperlipidemia, unspecified: Secondary | ICD-10-CM | POA: Diagnosis not present

## 2019-12-22 DIAGNOSIS — R630 Anorexia: Secondary | ICD-10-CM | POA: Diagnosis not present

## 2019-12-22 DIAGNOSIS — C7951 Secondary malignant neoplasm of bone: Secondary | ICD-10-CM | POA: Diagnosis not present

## 2019-12-22 DIAGNOSIS — R634 Abnormal weight loss: Secondary | ICD-10-CM | POA: Diagnosis not present

## 2019-12-22 DIAGNOSIS — G620 Drug-induced polyneuropathy: Secondary | ICD-10-CM | POA: Diagnosis not present

## 2019-12-22 DIAGNOSIS — I10 Essential (primary) hypertension: Secondary | ICD-10-CM | POA: Diagnosis not present

## 2019-12-22 DIAGNOSIS — I25119 Atherosclerotic heart disease of native coronary artery with unspecified angina pectoris: Secondary | ICD-10-CM | POA: Diagnosis not present

## 2019-12-23 DIAGNOSIS — R630 Anorexia: Secondary | ICD-10-CM | POA: Diagnosis not present

## 2019-12-23 DIAGNOSIS — I252 Old myocardial infarction: Secondary | ICD-10-CM | POA: Diagnosis not present

## 2019-12-23 DIAGNOSIS — C7951 Secondary malignant neoplasm of bone: Secondary | ICD-10-CM | POA: Diagnosis not present

## 2019-12-23 DIAGNOSIS — I10 Essential (primary) hypertension: Secondary | ICD-10-CM | POA: Diagnosis not present

## 2019-12-23 DIAGNOSIS — R634 Abnormal weight loss: Secondary | ICD-10-CM | POA: Diagnosis not present

## 2019-12-23 DIAGNOSIS — G620 Drug-induced polyneuropathy: Secondary | ICD-10-CM | POA: Diagnosis not present

## 2019-12-23 DIAGNOSIS — C61 Malignant neoplasm of prostate: Secondary | ICD-10-CM | POA: Diagnosis not present

## 2019-12-23 DIAGNOSIS — I25119 Atherosclerotic heart disease of native coronary artery with unspecified angina pectoris: Secondary | ICD-10-CM | POA: Diagnosis not present

## 2019-12-23 DIAGNOSIS — E785 Hyperlipidemia, unspecified: Secondary | ICD-10-CM | POA: Diagnosis not present

## 2019-12-24 DIAGNOSIS — R634 Abnormal weight loss: Secondary | ICD-10-CM | POA: Diagnosis not present

## 2019-12-24 DIAGNOSIS — I252 Old myocardial infarction: Secondary | ICD-10-CM | POA: Diagnosis not present

## 2019-12-24 DIAGNOSIS — C7951 Secondary malignant neoplasm of bone: Secondary | ICD-10-CM | POA: Diagnosis not present

## 2019-12-24 DIAGNOSIS — E785 Hyperlipidemia, unspecified: Secondary | ICD-10-CM | POA: Diagnosis not present

## 2019-12-24 DIAGNOSIS — R630 Anorexia: Secondary | ICD-10-CM | POA: Diagnosis not present

## 2019-12-24 DIAGNOSIS — C61 Malignant neoplasm of prostate: Secondary | ICD-10-CM | POA: Diagnosis not present

## 2019-12-24 DIAGNOSIS — I10 Essential (primary) hypertension: Secondary | ICD-10-CM | POA: Diagnosis not present

## 2019-12-24 DIAGNOSIS — G620 Drug-induced polyneuropathy: Secondary | ICD-10-CM | POA: Diagnosis not present

## 2019-12-24 DIAGNOSIS — I25119 Atherosclerotic heart disease of native coronary artery with unspecified angina pectoris: Secondary | ICD-10-CM | POA: Diagnosis not present

## 2019-12-25 ENCOUNTER — Other Ambulatory Visit: Payer: Self-pay | Admitting: Hospice and Palliative Medicine

## 2019-12-25 ENCOUNTER — Other Ambulatory Visit: Payer: Self-pay | Admitting: Internal Medicine

## 2019-12-25 DIAGNOSIS — R634 Abnormal weight loss: Secondary | ICD-10-CM | POA: Diagnosis not present

## 2019-12-25 DIAGNOSIS — I252 Old myocardial infarction: Secondary | ICD-10-CM | POA: Diagnosis not present

## 2019-12-25 DIAGNOSIS — C7951 Secondary malignant neoplasm of bone: Secondary | ICD-10-CM | POA: Diagnosis not present

## 2019-12-25 DIAGNOSIS — R630 Anorexia: Secondary | ICD-10-CM | POA: Diagnosis not present

## 2019-12-25 DIAGNOSIS — E785 Hyperlipidemia, unspecified: Secondary | ICD-10-CM | POA: Diagnosis not present

## 2019-12-25 DIAGNOSIS — G620 Drug-induced polyneuropathy: Secondary | ICD-10-CM | POA: Diagnosis not present

## 2019-12-25 DIAGNOSIS — I25119 Atherosclerotic heart disease of native coronary artery with unspecified angina pectoris: Secondary | ICD-10-CM | POA: Diagnosis not present

## 2019-12-25 DIAGNOSIS — I10 Essential (primary) hypertension: Secondary | ICD-10-CM | POA: Diagnosis not present

## 2019-12-25 DIAGNOSIS — C61 Malignant neoplasm of prostate: Secondary | ICD-10-CM | POA: Diagnosis not present

## 2019-12-26 ENCOUNTER — Other Ambulatory Visit: Payer: Self-pay | Admitting: Hematology and Oncology

## 2019-12-26 DIAGNOSIS — C7951 Secondary malignant neoplasm of bone: Secondary | ICD-10-CM | POA: Diagnosis not present

## 2019-12-26 DIAGNOSIS — R630 Anorexia: Secondary | ICD-10-CM | POA: Diagnosis not present

## 2019-12-26 DIAGNOSIS — C61 Malignant neoplasm of prostate: Secondary | ICD-10-CM | POA: Diagnosis not present

## 2019-12-26 DIAGNOSIS — R634 Abnormal weight loss: Secondary | ICD-10-CM | POA: Diagnosis not present

## 2019-12-26 DIAGNOSIS — I10 Essential (primary) hypertension: Secondary | ICD-10-CM | POA: Diagnosis not present

## 2019-12-26 DIAGNOSIS — I25119 Atherosclerotic heart disease of native coronary artery with unspecified angina pectoris: Secondary | ICD-10-CM | POA: Diagnosis not present

## 2019-12-26 DIAGNOSIS — I252 Old myocardial infarction: Secondary | ICD-10-CM | POA: Diagnosis not present

## 2019-12-26 DIAGNOSIS — E785 Hyperlipidemia, unspecified: Secondary | ICD-10-CM | POA: Diagnosis not present

## 2019-12-26 DIAGNOSIS — G620 Drug-induced polyneuropathy: Secondary | ICD-10-CM | POA: Diagnosis not present

## 2019-12-26 MED ORDER — DEXAMETHASONE 4 MG PO TABS: 4.0000 mg | ORAL_TABLET | Freq: Two times a day (BID) | ORAL | 1 refills | Status: DC

## 2019-12-27 DIAGNOSIS — C7951 Secondary malignant neoplasm of bone: Secondary | ICD-10-CM | POA: Diagnosis not present

## 2019-12-27 DIAGNOSIS — G620 Drug-induced polyneuropathy: Secondary | ICD-10-CM | POA: Diagnosis not present

## 2019-12-27 DIAGNOSIS — I25119 Atherosclerotic heart disease of native coronary artery with unspecified angina pectoris: Secondary | ICD-10-CM | POA: Diagnosis not present

## 2019-12-27 DIAGNOSIS — R630 Anorexia: Secondary | ICD-10-CM | POA: Diagnosis not present

## 2019-12-27 DIAGNOSIS — C61 Malignant neoplasm of prostate: Secondary | ICD-10-CM | POA: Diagnosis not present

## 2019-12-27 DIAGNOSIS — I252 Old myocardial infarction: Secondary | ICD-10-CM | POA: Diagnosis not present

## 2019-12-27 DIAGNOSIS — I10 Essential (primary) hypertension: Secondary | ICD-10-CM | POA: Diagnosis not present

## 2019-12-27 DIAGNOSIS — E785 Hyperlipidemia, unspecified: Secondary | ICD-10-CM | POA: Diagnosis not present

## 2019-12-27 DIAGNOSIS — R634 Abnormal weight loss: Secondary | ICD-10-CM | POA: Diagnosis not present

## 2019-12-28 DIAGNOSIS — R634 Abnormal weight loss: Secondary | ICD-10-CM | POA: Diagnosis not present

## 2019-12-28 DIAGNOSIS — C61 Malignant neoplasm of prostate: Secondary | ICD-10-CM | POA: Diagnosis not present

## 2019-12-28 DIAGNOSIS — E785 Hyperlipidemia, unspecified: Secondary | ICD-10-CM | POA: Diagnosis not present

## 2019-12-28 DIAGNOSIS — I25119 Atherosclerotic heart disease of native coronary artery with unspecified angina pectoris: Secondary | ICD-10-CM | POA: Diagnosis not present

## 2019-12-28 DIAGNOSIS — I252 Old myocardial infarction: Secondary | ICD-10-CM | POA: Diagnosis not present

## 2019-12-28 DIAGNOSIS — C7951 Secondary malignant neoplasm of bone: Secondary | ICD-10-CM | POA: Diagnosis not present

## 2019-12-28 DIAGNOSIS — I10 Essential (primary) hypertension: Secondary | ICD-10-CM | POA: Diagnosis not present

## 2019-12-28 DIAGNOSIS — R630 Anorexia: Secondary | ICD-10-CM | POA: Diagnosis not present

## 2019-12-28 DIAGNOSIS — G620 Drug-induced polyneuropathy: Secondary | ICD-10-CM | POA: Diagnosis not present

## 2019-12-29 DIAGNOSIS — R630 Anorexia: Secondary | ICD-10-CM | POA: Diagnosis not present

## 2019-12-29 DIAGNOSIS — G620 Drug-induced polyneuropathy: Secondary | ICD-10-CM | POA: Diagnosis not present

## 2019-12-29 DIAGNOSIS — E785 Hyperlipidemia, unspecified: Secondary | ICD-10-CM | POA: Diagnosis not present

## 2019-12-29 DIAGNOSIS — I10 Essential (primary) hypertension: Secondary | ICD-10-CM | POA: Diagnosis not present

## 2019-12-29 DIAGNOSIS — C7951 Secondary malignant neoplasm of bone: Secondary | ICD-10-CM | POA: Diagnosis not present

## 2019-12-29 DIAGNOSIS — C61 Malignant neoplasm of prostate: Secondary | ICD-10-CM | POA: Diagnosis not present

## 2019-12-29 DIAGNOSIS — I252 Old myocardial infarction: Secondary | ICD-10-CM | POA: Diagnosis not present

## 2019-12-29 DIAGNOSIS — R634 Abnormal weight loss: Secondary | ICD-10-CM | POA: Diagnosis not present

## 2019-12-29 DIAGNOSIS — I25119 Atherosclerotic heart disease of native coronary artery with unspecified angina pectoris: Secondary | ICD-10-CM | POA: Diagnosis not present

## 2019-12-30 DIAGNOSIS — I25119 Atherosclerotic heart disease of native coronary artery with unspecified angina pectoris: Secondary | ICD-10-CM | POA: Diagnosis not present

## 2019-12-30 DIAGNOSIS — I252 Old myocardial infarction: Secondary | ICD-10-CM | POA: Diagnosis not present

## 2019-12-30 DIAGNOSIS — G620 Drug-induced polyneuropathy: Secondary | ICD-10-CM | POA: Diagnosis not present

## 2019-12-30 DIAGNOSIS — I10 Essential (primary) hypertension: Secondary | ICD-10-CM | POA: Diagnosis not present

## 2019-12-30 DIAGNOSIS — C7951 Secondary malignant neoplasm of bone: Secondary | ICD-10-CM | POA: Diagnosis not present

## 2019-12-30 DIAGNOSIS — C61 Malignant neoplasm of prostate: Secondary | ICD-10-CM | POA: Diagnosis not present

## 2019-12-30 DIAGNOSIS — E785 Hyperlipidemia, unspecified: Secondary | ICD-10-CM | POA: Diagnosis not present

## 2019-12-30 DIAGNOSIS — R634 Abnormal weight loss: Secondary | ICD-10-CM | POA: Diagnosis not present

## 2019-12-30 DIAGNOSIS — R630 Anorexia: Secondary | ICD-10-CM | POA: Diagnosis not present

## 2019-12-31 DIAGNOSIS — G620 Drug-induced polyneuropathy: Secondary | ICD-10-CM | POA: Diagnosis not present

## 2019-12-31 DIAGNOSIS — C61 Malignant neoplasm of prostate: Secondary | ICD-10-CM | POA: Diagnosis not present

## 2019-12-31 DIAGNOSIS — I10 Essential (primary) hypertension: Secondary | ICD-10-CM | POA: Diagnosis not present

## 2019-12-31 DIAGNOSIS — I25119 Atherosclerotic heart disease of native coronary artery with unspecified angina pectoris: Secondary | ICD-10-CM | POA: Diagnosis not present

## 2019-12-31 DIAGNOSIS — C7951 Secondary malignant neoplasm of bone: Secondary | ICD-10-CM | POA: Diagnosis not present

## 2019-12-31 DIAGNOSIS — R630 Anorexia: Secondary | ICD-10-CM | POA: Diagnosis not present

## 2019-12-31 DIAGNOSIS — R634 Abnormal weight loss: Secondary | ICD-10-CM | POA: Diagnosis not present

## 2019-12-31 DIAGNOSIS — E785 Hyperlipidemia, unspecified: Secondary | ICD-10-CM | POA: Diagnosis not present

## 2019-12-31 DIAGNOSIS — I252 Old myocardial infarction: Secondary | ICD-10-CM | POA: Diagnosis not present

## 2020-01-01 DIAGNOSIS — C7951 Secondary malignant neoplasm of bone: Secondary | ICD-10-CM | POA: Diagnosis not present

## 2020-01-01 DIAGNOSIS — E785 Hyperlipidemia, unspecified: Secondary | ICD-10-CM | POA: Diagnosis not present

## 2020-01-01 DIAGNOSIS — I10 Essential (primary) hypertension: Secondary | ICD-10-CM | POA: Diagnosis not present

## 2020-01-01 DIAGNOSIS — I252 Old myocardial infarction: Secondary | ICD-10-CM | POA: Diagnosis not present

## 2020-01-01 DIAGNOSIS — C61 Malignant neoplasm of prostate: Secondary | ICD-10-CM | POA: Diagnosis not present

## 2020-01-01 DIAGNOSIS — G620 Drug-induced polyneuropathy: Secondary | ICD-10-CM | POA: Diagnosis not present

## 2020-01-01 DIAGNOSIS — R630 Anorexia: Secondary | ICD-10-CM | POA: Diagnosis not present

## 2020-01-01 DIAGNOSIS — I25119 Atherosclerotic heart disease of native coronary artery with unspecified angina pectoris: Secondary | ICD-10-CM | POA: Diagnosis not present

## 2020-01-01 DIAGNOSIS — R634 Abnormal weight loss: Secondary | ICD-10-CM | POA: Diagnosis not present

## 2020-01-02 DIAGNOSIS — I25119 Atherosclerotic heart disease of native coronary artery with unspecified angina pectoris: Secondary | ICD-10-CM | POA: Diagnosis not present

## 2020-01-02 DIAGNOSIS — G620 Drug-induced polyneuropathy: Secondary | ICD-10-CM | POA: Diagnosis not present

## 2020-01-02 DIAGNOSIS — I252 Old myocardial infarction: Secondary | ICD-10-CM | POA: Diagnosis not present

## 2020-01-02 DIAGNOSIS — C61 Malignant neoplasm of prostate: Secondary | ICD-10-CM | POA: Diagnosis not present

## 2020-01-02 DIAGNOSIS — R630 Anorexia: Secondary | ICD-10-CM | POA: Diagnosis not present

## 2020-01-02 DIAGNOSIS — E785 Hyperlipidemia, unspecified: Secondary | ICD-10-CM | POA: Diagnosis not present

## 2020-01-02 DIAGNOSIS — I10 Essential (primary) hypertension: Secondary | ICD-10-CM | POA: Diagnosis not present

## 2020-01-02 DIAGNOSIS — C7951 Secondary malignant neoplasm of bone: Secondary | ICD-10-CM | POA: Diagnosis not present

## 2020-01-02 DIAGNOSIS — R634 Abnormal weight loss: Secondary | ICD-10-CM | POA: Diagnosis not present

## 2020-01-03 DIAGNOSIS — E785 Hyperlipidemia, unspecified: Secondary | ICD-10-CM | POA: Diagnosis not present

## 2020-01-03 DIAGNOSIS — G620 Drug-induced polyneuropathy: Secondary | ICD-10-CM | POA: Diagnosis not present

## 2020-01-03 DIAGNOSIS — I25119 Atherosclerotic heart disease of native coronary artery with unspecified angina pectoris: Secondary | ICD-10-CM | POA: Diagnosis not present

## 2020-01-03 DIAGNOSIS — R634 Abnormal weight loss: Secondary | ICD-10-CM | POA: Diagnosis not present

## 2020-01-03 DIAGNOSIS — I252 Old myocardial infarction: Secondary | ICD-10-CM | POA: Diagnosis not present

## 2020-01-03 DIAGNOSIS — C61 Malignant neoplasm of prostate: Secondary | ICD-10-CM | POA: Diagnosis not present

## 2020-01-03 DIAGNOSIS — R630 Anorexia: Secondary | ICD-10-CM | POA: Diagnosis not present

## 2020-01-03 DIAGNOSIS — C7951 Secondary malignant neoplasm of bone: Secondary | ICD-10-CM | POA: Diagnosis not present

## 2020-01-03 DIAGNOSIS — I10 Essential (primary) hypertension: Secondary | ICD-10-CM | POA: Diagnosis not present

## 2020-01-04 DIAGNOSIS — I252 Old myocardial infarction: Secondary | ICD-10-CM | POA: Diagnosis not present

## 2020-01-04 DIAGNOSIS — E785 Hyperlipidemia, unspecified: Secondary | ICD-10-CM | POA: Diagnosis not present

## 2020-01-04 DIAGNOSIS — I10 Essential (primary) hypertension: Secondary | ICD-10-CM | POA: Diagnosis not present

## 2020-01-04 DIAGNOSIS — C61 Malignant neoplasm of prostate: Secondary | ICD-10-CM | POA: Diagnosis not present

## 2020-01-04 DIAGNOSIS — C7951 Secondary malignant neoplasm of bone: Secondary | ICD-10-CM | POA: Diagnosis not present

## 2020-01-04 DIAGNOSIS — R630 Anorexia: Secondary | ICD-10-CM | POA: Diagnosis not present

## 2020-01-04 DIAGNOSIS — G620 Drug-induced polyneuropathy: Secondary | ICD-10-CM | POA: Diagnosis not present

## 2020-01-04 DIAGNOSIS — R634 Abnormal weight loss: Secondary | ICD-10-CM | POA: Diagnosis not present

## 2020-01-04 DIAGNOSIS — I25119 Atherosclerotic heart disease of native coronary artery with unspecified angina pectoris: Secondary | ICD-10-CM | POA: Diagnosis not present

## 2020-01-05 DIAGNOSIS — R630 Anorexia: Secondary | ICD-10-CM | POA: Diagnosis not present

## 2020-01-05 DIAGNOSIS — R634 Abnormal weight loss: Secondary | ICD-10-CM | POA: Diagnosis not present

## 2020-01-05 DIAGNOSIS — E785 Hyperlipidemia, unspecified: Secondary | ICD-10-CM | POA: Diagnosis not present

## 2020-01-05 DIAGNOSIS — C7951 Secondary malignant neoplasm of bone: Secondary | ICD-10-CM | POA: Diagnosis not present

## 2020-01-05 DIAGNOSIS — C61 Malignant neoplasm of prostate: Secondary | ICD-10-CM | POA: Diagnosis not present

## 2020-01-05 DIAGNOSIS — G620 Drug-induced polyneuropathy: Secondary | ICD-10-CM | POA: Diagnosis not present

## 2020-01-05 DIAGNOSIS — I252 Old myocardial infarction: Secondary | ICD-10-CM | POA: Diagnosis not present

## 2020-01-05 DIAGNOSIS — I10 Essential (primary) hypertension: Secondary | ICD-10-CM | POA: Diagnosis not present

## 2020-01-05 DIAGNOSIS — I25119 Atherosclerotic heart disease of native coronary artery with unspecified angina pectoris: Secondary | ICD-10-CM | POA: Diagnosis not present

## 2020-01-06 DIAGNOSIS — C61 Malignant neoplasm of prostate: Secondary | ICD-10-CM | POA: Diagnosis not present

## 2020-01-06 DIAGNOSIS — G620 Drug-induced polyneuropathy: Secondary | ICD-10-CM | POA: Diagnosis not present

## 2020-01-06 DIAGNOSIS — I10 Essential (primary) hypertension: Secondary | ICD-10-CM | POA: Diagnosis not present

## 2020-01-06 DIAGNOSIS — R630 Anorexia: Secondary | ICD-10-CM | POA: Diagnosis not present

## 2020-01-06 DIAGNOSIS — I252 Old myocardial infarction: Secondary | ICD-10-CM | POA: Diagnosis not present

## 2020-01-06 DIAGNOSIS — C7951 Secondary malignant neoplasm of bone: Secondary | ICD-10-CM | POA: Diagnosis not present

## 2020-01-06 DIAGNOSIS — R634 Abnormal weight loss: Secondary | ICD-10-CM | POA: Diagnosis not present

## 2020-01-06 DIAGNOSIS — I25119 Atherosclerotic heart disease of native coronary artery with unspecified angina pectoris: Secondary | ICD-10-CM | POA: Diagnosis not present

## 2020-01-06 DIAGNOSIS — E785 Hyperlipidemia, unspecified: Secondary | ICD-10-CM | POA: Diagnosis not present

## 2020-01-07 DIAGNOSIS — G620 Drug-induced polyneuropathy: Secondary | ICD-10-CM | POA: Diagnosis not present

## 2020-01-07 DIAGNOSIS — I25119 Atherosclerotic heart disease of native coronary artery with unspecified angina pectoris: Secondary | ICD-10-CM | POA: Diagnosis not present

## 2020-01-07 DIAGNOSIS — E785 Hyperlipidemia, unspecified: Secondary | ICD-10-CM | POA: Diagnosis not present

## 2020-01-07 DIAGNOSIS — C7951 Secondary malignant neoplasm of bone: Secondary | ICD-10-CM | POA: Diagnosis not present

## 2020-01-07 DIAGNOSIS — R634 Abnormal weight loss: Secondary | ICD-10-CM | POA: Diagnosis not present

## 2020-01-07 DIAGNOSIS — I252 Old myocardial infarction: Secondary | ICD-10-CM | POA: Diagnosis not present

## 2020-01-07 DIAGNOSIS — I10 Essential (primary) hypertension: Secondary | ICD-10-CM | POA: Diagnosis not present

## 2020-01-07 DIAGNOSIS — R630 Anorexia: Secondary | ICD-10-CM | POA: Diagnosis not present

## 2020-01-07 DIAGNOSIS — C61 Malignant neoplasm of prostate: Secondary | ICD-10-CM | POA: Diagnosis not present

## 2020-01-08 DIAGNOSIS — I25119 Atherosclerotic heart disease of native coronary artery with unspecified angina pectoris: Secondary | ICD-10-CM | POA: Diagnosis not present

## 2020-01-08 DIAGNOSIS — C61 Malignant neoplasm of prostate: Secondary | ICD-10-CM | POA: Diagnosis not present

## 2020-01-08 DIAGNOSIS — I252 Old myocardial infarction: Secondary | ICD-10-CM | POA: Diagnosis not present

## 2020-01-08 DIAGNOSIS — I10 Essential (primary) hypertension: Secondary | ICD-10-CM | POA: Diagnosis not present

## 2020-01-08 DIAGNOSIS — R630 Anorexia: Secondary | ICD-10-CM | POA: Diagnosis not present

## 2020-01-08 DIAGNOSIS — R634 Abnormal weight loss: Secondary | ICD-10-CM | POA: Diagnosis not present

## 2020-01-08 DIAGNOSIS — E785 Hyperlipidemia, unspecified: Secondary | ICD-10-CM | POA: Diagnosis not present

## 2020-01-08 DIAGNOSIS — G620 Drug-induced polyneuropathy: Secondary | ICD-10-CM | POA: Diagnosis not present

## 2020-01-08 DIAGNOSIS — C7951 Secondary malignant neoplasm of bone: Secondary | ICD-10-CM | POA: Diagnosis not present

## 2020-01-09 DIAGNOSIS — R630 Anorexia: Secondary | ICD-10-CM | POA: Diagnosis not present

## 2020-01-09 DIAGNOSIS — E785 Hyperlipidemia, unspecified: Secondary | ICD-10-CM | POA: Diagnosis not present

## 2020-01-09 DIAGNOSIS — G620 Drug-induced polyneuropathy: Secondary | ICD-10-CM | POA: Diagnosis not present

## 2020-01-09 DIAGNOSIS — I10 Essential (primary) hypertension: Secondary | ICD-10-CM | POA: Diagnosis not present

## 2020-01-09 DIAGNOSIS — C61 Malignant neoplasm of prostate: Secondary | ICD-10-CM | POA: Diagnosis not present

## 2020-01-09 DIAGNOSIS — I25119 Atherosclerotic heart disease of native coronary artery with unspecified angina pectoris: Secondary | ICD-10-CM | POA: Diagnosis not present

## 2020-01-09 DIAGNOSIS — I252 Old myocardial infarction: Secondary | ICD-10-CM | POA: Diagnosis not present

## 2020-01-09 DIAGNOSIS — R634 Abnormal weight loss: Secondary | ICD-10-CM | POA: Diagnosis not present

## 2020-01-09 DIAGNOSIS — C7951 Secondary malignant neoplasm of bone: Secondary | ICD-10-CM | POA: Diagnosis not present

## 2020-01-10 DIAGNOSIS — G620 Drug-induced polyneuropathy: Secondary | ICD-10-CM | POA: Diagnosis not present

## 2020-01-10 DIAGNOSIS — C7951 Secondary malignant neoplasm of bone: Secondary | ICD-10-CM | POA: Diagnosis not present

## 2020-01-10 DIAGNOSIS — I10 Essential (primary) hypertension: Secondary | ICD-10-CM | POA: Diagnosis not present

## 2020-01-10 DIAGNOSIS — E785 Hyperlipidemia, unspecified: Secondary | ICD-10-CM | POA: Diagnosis not present

## 2020-01-10 DIAGNOSIS — C61 Malignant neoplasm of prostate: Secondary | ICD-10-CM | POA: Diagnosis not present

## 2020-01-10 DIAGNOSIS — I25119 Atherosclerotic heart disease of native coronary artery with unspecified angina pectoris: Secondary | ICD-10-CM | POA: Diagnosis not present

## 2020-01-10 DIAGNOSIS — R630 Anorexia: Secondary | ICD-10-CM | POA: Diagnosis not present

## 2020-01-10 DIAGNOSIS — I252 Old myocardial infarction: Secondary | ICD-10-CM | POA: Diagnosis not present

## 2020-01-10 DIAGNOSIS — R634 Abnormal weight loss: Secondary | ICD-10-CM | POA: Diagnosis not present

## 2020-01-11 DIAGNOSIS — I10 Essential (primary) hypertension: Secondary | ICD-10-CM | POA: Diagnosis not present

## 2020-01-11 DIAGNOSIS — I252 Old myocardial infarction: Secondary | ICD-10-CM | POA: Diagnosis not present

## 2020-01-11 DIAGNOSIS — G620 Drug-induced polyneuropathy: Secondary | ICD-10-CM | POA: Diagnosis not present

## 2020-01-11 DIAGNOSIS — E785 Hyperlipidemia, unspecified: Secondary | ICD-10-CM | POA: Diagnosis not present

## 2020-01-11 DIAGNOSIS — C61 Malignant neoplasm of prostate: Secondary | ICD-10-CM | POA: Diagnosis not present

## 2020-01-11 DIAGNOSIS — R630 Anorexia: Secondary | ICD-10-CM | POA: Diagnosis not present

## 2020-01-11 DIAGNOSIS — R634 Abnormal weight loss: Secondary | ICD-10-CM | POA: Diagnosis not present

## 2020-01-11 DIAGNOSIS — C7951 Secondary malignant neoplasm of bone: Secondary | ICD-10-CM | POA: Diagnosis not present

## 2020-01-11 DIAGNOSIS — I25119 Atherosclerotic heart disease of native coronary artery with unspecified angina pectoris: Secondary | ICD-10-CM | POA: Diagnosis not present

## 2020-01-12 DIAGNOSIS — I10 Essential (primary) hypertension: Secondary | ICD-10-CM | POA: Diagnosis not present

## 2020-01-12 DIAGNOSIS — C7951 Secondary malignant neoplasm of bone: Secondary | ICD-10-CM | POA: Diagnosis not present

## 2020-01-12 DIAGNOSIS — I25119 Atherosclerotic heart disease of native coronary artery with unspecified angina pectoris: Secondary | ICD-10-CM | POA: Diagnosis not present

## 2020-01-12 DIAGNOSIS — C61 Malignant neoplasm of prostate: Secondary | ICD-10-CM | POA: Diagnosis not present

## 2020-01-12 DIAGNOSIS — R634 Abnormal weight loss: Secondary | ICD-10-CM | POA: Diagnosis not present

## 2020-01-12 DIAGNOSIS — R630 Anorexia: Secondary | ICD-10-CM | POA: Diagnosis not present

## 2020-01-12 DIAGNOSIS — I252 Old myocardial infarction: Secondary | ICD-10-CM | POA: Diagnosis not present

## 2020-01-12 DIAGNOSIS — E785 Hyperlipidemia, unspecified: Secondary | ICD-10-CM | POA: Diagnosis not present

## 2020-01-12 DIAGNOSIS — G620 Drug-induced polyneuropathy: Secondary | ICD-10-CM | POA: Diagnosis not present

## 2020-01-13 DIAGNOSIS — E785 Hyperlipidemia, unspecified: Secondary | ICD-10-CM | POA: Diagnosis not present

## 2020-01-13 DIAGNOSIS — R634 Abnormal weight loss: Secondary | ICD-10-CM | POA: Diagnosis not present

## 2020-01-13 DIAGNOSIS — I252 Old myocardial infarction: Secondary | ICD-10-CM | POA: Diagnosis not present

## 2020-01-13 DIAGNOSIS — C7951 Secondary malignant neoplasm of bone: Secondary | ICD-10-CM | POA: Diagnosis not present

## 2020-01-13 DIAGNOSIS — G620 Drug-induced polyneuropathy: Secondary | ICD-10-CM | POA: Diagnosis not present

## 2020-01-13 DIAGNOSIS — C61 Malignant neoplasm of prostate: Secondary | ICD-10-CM | POA: Diagnosis not present

## 2020-01-13 DIAGNOSIS — I10 Essential (primary) hypertension: Secondary | ICD-10-CM | POA: Diagnosis not present

## 2020-01-13 DIAGNOSIS — I25119 Atherosclerotic heart disease of native coronary artery with unspecified angina pectoris: Secondary | ICD-10-CM | POA: Diagnosis not present

## 2020-01-13 DIAGNOSIS — R630 Anorexia: Secondary | ICD-10-CM | POA: Diagnosis not present

## 2020-01-14 DIAGNOSIS — G620 Drug-induced polyneuropathy: Secondary | ICD-10-CM | POA: Diagnosis not present

## 2020-01-14 DIAGNOSIS — I252 Old myocardial infarction: Secondary | ICD-10-CM | POA: Diagnosis not present

## 2020-01-14 DIAGNOSIS — C61 Malignant neoplasm of prostate: Secondary | ICD-10-CM | POA: Diagnosis not present

## 2020-01-14 DIAGNOSIS — I25119 Atherosclerotic heart disease of native coronary artery with unspecified angina pectoris: Secondary | ICD-10-CM | POA: Diagnosis not present

## 2020-01-14 DIAGNOSIS — R634 Abnormal weight loss: Secondary | ICD-10-CM | POA: Diagnosis not present

## 2020-01-14 DIAGNOSIS — R630 Anorexia: Secondary | ICD-10-CM | POA: Diagnosis not present

## 2020-01-14 DIAGNOSIS — E785 Hyperlipidemia, unspecified: Secondary | ICD-10-CM | POA: Diagnosis not present

## 2020-01-14 DIAGNOSIS — I10 Essential (primary) hypertension: Secondary | ICD-10-CM | POA: Diagnosis not present

## 2020-01-14 DIAGNOSIS — C7951 Secondary malignant neoplasm of bone: Secondary | ICD-10-CM | POA: Diagnosis not present

## 2020-01-15 DIAGNOSIS — I10 Essential (primary) hypertension: Secondary | ICD-10-CM | POA: Diagnosis not present

## 2020-01-15 DIAGNOSIS — G620 Drug-induced polyneuropathy: Secondary | ICD-10-CM | POA: Diagnosis not present

## 2020-01-15 DIAGNOSIS — I252 Old myocardial infarction: Secondary | ICD-10-CM | POA: Diagnosis not present

## 2020-01-15 DIAGNOSIS — I25119 Atherosclerotic heart disease of native coronary artery with unspecified angina pectoris: Secondary | ICD-10-CM | POA: Diagnosis not present

## 2020-01-15 DIAGNOSIS — C7951 Secondary malignant neoplasm of bone: Secondary | ICD-10-CM | POA: Diagnosis not present

## 2020-01-15 DIAGNOSIS — R630 Anorexia: Secondary | ICD-10-CM | POA: Diagnosis not present

## 2020-01-15 DIAGNOSIS — E785 Hyperlipidemia, unspecified: Secondary | ICD-10-CM | POA: Diagnosis not present

## 2020-01-15 DIAGNOSIS — C61 Malignant neoplasm of prostate: Secondary | ICD-10-CM | POA: Diagnosis not present

## 2020-01-15 DIAGNOSIS — R634 Abnormal weight loss: Secondary | ICD-10-CM | POA: Diagnosis not present

## 2020-01-16 DIAGNOSIS — G620 Drug-induced polyneuropathy: Secondary | ICD-10-CM | POA: Diagnosis not present

## 2020-01-16 DIAGNOSIS — R634 Abnormal weight loss: Secondary | ICD-10-CM | POA: Diagnosis not present

## 2020-01-16 DIAGNOSIS — E785 Hyperlipidemia, unspecified: Secondary | ICD-10-CM | POA: Diagnosis not present

## 2020-01-16 DIAGNOSIS — C61 Malignant neoplasm of prostate: Secondary | ICD-10-CM | POA: Diagnosis not present

## 2020-01-16 DIAGNOSIS — I25119 Atherosclerotic heart disease of native coronary artery with unspecified angina pectoris: Secondary | ICD-10-CM | POA: Diagnosis not present

## 2020-01-16 DIAGNOSIS — C7951 Secondary malignant neoplasm of bone: Secondary | ICD-10-CM | POA: Diagnosis not present

## 2020-01-16 DIAGNOSIS — I252 Old myocardial infarction: Secondary | ICD-10-CM | POA: Diagnosis not present

## 2020-01-16 DIAGNOSIS — I10 Essential (primary) hypertension: Secondary | ICD-10-CM | POA: Diagnosis not present

## 2020-01-16 DIAGNOSIS — R630 Anorexia: Secondary | ICD-10-CM | POA: Diagnosis not present

## 2020-01-17 DIAGNOSIS — R634 Abnormal weight loss: Secondary | ICD-10-CM | POA: Diagnosis not present

## 2020-01-17 DIAGNOSIS — C61 Malignant neoplasm of prostate: Secondary | ICD-10-CM | POA: Diagnosis not present

## 2020-01-17 DIAGNOSIS — E785 Hyperlipidemia, unspecified: Secondary | ICD-10-CM | POA: Diagnosis not present

## 2020-01-17 DIAGNOSIS — C7951 Secondary malignant neoplasm of bone: Secondary | ICD-10-CM | POA: Diagnosis not present

## 2020-01-17 DIAGNOSIS — I252 Old myocardial infarction: Secondary | ICD-10-CM | POA: Diagnosis not present

## 2020-01-17 DIAGNOSIS — R630 Anorexia: Secondary | ICD-10-CM | POA: Diagnosis not present

## 2020-01-17 DIAGNOSIS — I10 Essential (primary) hypertension: Secondary | ICD-10-CM | POA: Diagnosis not present

## 2020-01-17 DIAGNOSIS — I25119 Atherosclerotic heart disease of native coronary artery with unspecified angina pectoris: Secondary | ICD-10-CM | POA: Diagnosis not present

## 2020-01-17 DIAGNOSIS — G620 Drug-induced polyneuropathy: Secondary | ICD-10-CM | POA: Diagnosis not present

## 2020-01-18 DIAGNOSIS — G620 Drug-induced polyneuropathy: Secondary | ICD-10-CM | POA: Diagnosis not present

## 2020-01-18 DIAGNOSIS — C61 Malignant neoplasm of prostate: Secondary | ICD-10-CM | POA: Diagnosis not present

## 2020-01-18 DIAGNOSIS — R630 Anorexia: Secondary | ICD-10-CM | POA: Diagnosis not present

## 2020-01-18 DIAGNOSIS — R634 Abnormal weight loss: Secondary | ICD-10-CM | POA: Diagnosis not present

## 2020-01-18 DIAGNOSIS — I25119 Atherosclerotic heart disease of native coronary artery with unspecified angina pectoris: Secondary | ICD-10-CM | POA: Diagnosis not present

## 2020-01-18 DIAGNOSIS — C7951 Secondary malignant neoplasm of bone: Secondary | ICD-10-CM | POA: Diagnosis not present

## 2020-01-18 DIAGNOSIS — I10 Essential (primary) hypertension: Secondary | ICD-10-CM | POA: Diagnosis not present

## 2020-01-18 DIAGNOSIS — I252 Old myocardial infarction: Secondary | ICD-10-CM | POA: Diagnosis not present

## 2020-01-18 DIAGNOSIS — E785 Hyperlipidemia, unspecified: Secondary | ICD-10-CM | POA: Diagnosis not present

## 2020-01-19 ENCOUNTER — Other Ambulatory Visit: Payer: Self-pay | Admitting: *Deleted

## 2020-01-19 ENCOUNTER — Other Ambulatory Visit: Payer: Self-pay | Admitting: Hospice and Palliative Medicine

## 2020-01-19 ENCOUNTER — Other Ambulatory Visit: Payer: Self-pay | Admitting: Internal Medicine

## 2020-01-19 ENCOUNTER — Encounter: Payer: Self-pay | Admitting: Internal Medicine

## 2020-01-19 DIAGNOSIS — G893 Neoplasm related pain (acute) (chronic): Secondary | ICD-10-CM

## 2020-01-19 DIAGNOSIS — C7951 Secondary malignant neoplasm of bone: Secondary | ICD-10-CM | POA: Diagnosis not present

## 2020-01-19 DIAGNOSIS — G620 Drug-induced polyneuropathy: Secondary | ICD-10-CM | POA: Diagnosis not present

## 2020-01-19 DIAGNOSIS — C61 Malignant neoplasm of prostate: Secondary | ICD-10-CM | POA: Diagnosis not present

## 2020-01-19 DIAGNOSIS — E785 Hyperlipidemia, unspecified: Secondary | ICD-10-CM | POA: Diagnosis not present

## 2020-01-19 DIAGNOSIS — I252 Old myocardial infarction: Secondary | ICD-10-CM | POA: Diagnosis not present

## 2020-01-19 DIAGNOSIS — I25119 Atherosclerotic heart disease of native coronary artery with unspecified angina pectoris: Secondary | ICD-10-CM | POA: Diagnosis not present

## 2020-01-19 DIAGNOSIS — I10 Essential (primary) hypertension: Secondary | ICD-10-CM | POA: Diagnosis not present

## 2020-01-19 DIAGNOSIS — R630 Anorexia: Secondary | ICD-10-CM | POA: Diagnosis not present

## 2020-01-19 DIAGNOSIS — R634 Abnormal weight loss: Secondary | ICD-10-CM | POA: Diagnosis not present

## 2020-01-19 MED ORDER — FENTANYL 50 MCG/HR TD PT72: 1.0000 | MEDICATED_PATCH | TRANSDERMAL | 0 refills | Status: DC

## 2020-01-19 MED ORDER — FENTANYL 100 MCG/HR TD PT72: 1.0000 | MEDICATED_PATCH | TRANSDERMAL | 0 refills | Status: DC

## 2020-01-19 MED ORDER — OXYCODONE HCL 15 MG PO TABS: 15.0000 mg | ORAL_TABLET | Freq: Four times a day (QID) | ORAL | 0 refills | Status: DC | PRN

## 2020-01-20 DIAGNOSIS — I252 Old myocardial infarction: Secondary | ICD-10-CM | POA: Diagnosis not present

## 2020-01-20 DIAGNOSIS — E785 Hyperlipidemia, unspecified: Secondary | ICD-10-CM | POA: Diagnosis not present

## 2020-01-20 DIAGNOSIS — G620 Drug-induced polyneuropathy: Secondary | ICD-10-CM | POA: Diagnosis not present

## 2020-01-20 DIAGNOSIS — C61 Malignant neoplasm of prostate: Secondary | ICD-10-CM | POA: Diagnosis not present

## 2020-01-20 DIAGNOSIS — R630 Anorexia: Secondary | ICD-10-CM | POA: Diagnosis not present

## 2020-01-20 DIAGNOSIS — R634 Abnormal weight loss: Secondary | ICD-10-CM | POA: Diagnosis not present

## 2020-01-20 DIAGNOSIS — I25119 Atherosclerotic heart disease of native coronary artery with unspecified angina pectoris: Secondary | ICD-10-CM | POA: Diagnosis not present

## 2020-01-20 DIAGNOSIS — I10 Essential (primary) hypertension: Secondary | ICD-10-CM | POA: Diagnosis not present

## 2020-01-20 DIAGNOSIS — C7951 Secondary malignant neoplasm of bone: Secondary | ICD-10-CM | POA: Diagnosis not present

## 2020-01-21 DIAGNOSIS — G620 Drug-induced polyneuropathy: Secondary | ICD-10-CM | POA: Diagnosis not present

## 2020-01-21 DIAGNOSIS — I252 Old myocardial infarction: Secondary | ICD-10-CM | POA: Diagnosis not present

## 2020-01-21 DIAGNOSIS — E785 Hyperlipidemia, unspecified: Secondary | ICD-10-CM | POA: Diagnosis not present

## 2020-01-21 DIAGNOSIS — C7951 Secondary malignant neoplasm of bone: Secondary | ICD-10-CM | POA: Diagnosis not present

## 2020-01-21 DIAGNOSIS — R634 Abnormal weight loss: Secondary | ICD-10-CM | POA: Diagnosis not present

## 2020-01-21 DIAGNOSIS — I10 Essential (primary) hypertension: Secondary | ICD-10-CM | POA: Diagnosis not present

## 2020-01-21 DIAGNOSIS — R630 Anorexia: Secondary | ICD-10-CM | POA: Diagnosis not present

## 2020-01-21 DIAGNOSIS — C61 Malignant neoplasm of prostate: Secondary | ICD-10-CM | POA: Diagnosis not present

## 2020-01-21 DIAGNOSIS — I25119 Atherosclerotic heart disease of native coronary artery with unspecified angina pectoris: Secondary | ICD-10-CM | POA: Diagnosis not present

## 2020-01-22 DIAGNOSIS — G620 Drug-induced polyneuropathy: Secondary | ICD-10-CM | POA: Diagnosis not present

## 2020-01-22 DIAGNOSIS — C61 Malignant neoplasm of prostate: Secondary | ICD-10-CM | POA: Diagnosis not present

## 2020-01-22 DIAGNOSIS — C7951 Secondary malignant neoplasm of bone: Secondary | ICD-10-CM | POA: Diagnosis not present

## 2020-01-22 DIAGNOSIS — R634 Abnormal weight loss: Secondary | ICD-10-CM | POA: Diagnosis not present

## 2020-01-22 DIAGNOSIS — I10 Essential (primary) hypertension: Secondary | ICD-10-CM | POA: Diagnosis not present

## 2020-01-22 DIAGNOSIS — I252 Old myocardial infarction: Secondary | ICD-10-CM | POA: Diagnosis not present

## 2020-01-22 DIAGNOSIS — R630 Anorexia: Secondary | ICD-10-CM | POA: Diagnosis not present

## 2020-01-22 DIAGNOSIS — I25119 Atherosclerotic heart disease of native coronary artery with unspecified angina pectoris: Secondary | ICD-10-CM | POA: Diagnosis not present

## 2020-01-22 DIAGNOSIS — E785 Hyperlipidemia, unspecified: Secondary | ICD-10-CM | POA: Diagnosis not present

## 2020-01-23 DIAGNOSIS — G620 Drug-induced polyneuropathy: Secondary | ICD-10-CM | POA: Diagnosis not present

## 2020-01-23 DIAGNOSIS — R634 Abnormal weight loss: Secondary | ICD-10-CM | POA: Diagnosis not present

## 2020-01-23 DIAGNOSIS — I25119 Atherosclerotic heart disease of native coronary artery with unspecified angina pectoris: Secondary | ICD-10-CM | POA: Diagnosis not present

## 2020-01-23 DIAGNOSIS — E785 Hyperlipidemia, unspecified: Secondary | ICD-10-CM | POA: Diagnosis not present

## 2020-01-23 DIAGNOSIS — C7951 Secondary malignant neoplasm of bone: Secondary | ICD-10-CM | POA: Diagnosis not present

## 2020-01-23 DIAGNOSIS — I252 Old myocardial infarction: Secondary | ICD-10-CM | POA: Diagnosis not present

## 2020-01-23 DIAGNOSIS — I10 Essential (primary) hypertension: Secondary | ICD-10-CM | POA: Diagnosis not present

## 2020-01-23 DIAGNOSIS — R630 Anorexia: Secondary | ICD-10-CM | POA: Diagnosis not present

## 2020-01-23 DIAGNOSIS — C61 Malignant neoplasm of prostate: Secondary | ICD-10-CM | POA: Diagnosis not present

## 2020-01-24 DIAGNOSIS — R630 Anorexia: Secondary | ICD-10-CM | POA: Diagnosis not present

## 2020-01-24 DIAGNOSIS — C7951 Secondary malignant neoplasm of bone: Secondary | ICD-10-CM | POA: Diagnosis not present

## 2020-01-24 DIAGNOSIS — I25119 Atherosclerotic heart disease of native coronary artery with unspecified angina pectoris: Secondary | ICD-10-CM | POA: Diagnosis not present

## 2020-01-24 DIAGNOSIS — G620 Drug-induced polyneuropathy: Secondary | ICD-10-CM | POA: Diagnosis not present

## 2020-01-24 DIAGNOSIS — I10 Essential (primary) hypertension: Secondary | ICD-10-CM | POA: Diagnosis not present

## 2020-01-24 DIAGNOSIS — I252 Old myocardial infarction: Secondary | ICD-10-CM | POA: Diagnosis not present

## 2020-01-24 DIAGNOSIS — E785 Hyperlipidemia, unspecified: Secondary | ICD-10-CM | POA: Diagnosis not present

## 2020-01-24 DIAGNOSIS — C61 Malignant neoplasm of prostate: Secondary | ICD-10-CM | POA: Diagnosis not present

## 2020-01-24 DIAGNOSIS — R634 Abnormal weight loss: Secondary | ICD-10-CM | POA: Diagnosis not present

## 2020-01-25 DIAGNOSIS — R634 Abnormal weight loss: Secondary | ICD-10-CM | POA: Diagnosis not present

## 2020-01-25 DIAGNOSIS — C7951 Secondary malignant neoplasm of bone: Secondary | ICD-10-CM | POA: Diagnosis not present

## 2020-01-25 DIAGNOSIS — R630 Anorexia: Secondary | ICD-10-CM | POA: Diagnosis not present

## 2020-01-25 DIAGNOSIS — C61 Malignant neoplasm of prostate: Secondary | ICD-10-CM | POA: Diagnosis not present

## 2020-01-26 DIAGNOSIS — C61 Malignant neoplasm of prostate: Secondary | ICD-10-CM | POA: Diagnosis not present

## 2020-01-26 DIAGNOSIS — R634 Abnormal weight loss: Secondary | ICD-10-CM | POA: Diagnosis not present

## 2020-01-26 DIAGNOSIS — C7951 Secondary malignant neoplasm of bone: Secondary | ICD-10-CM | POA: Diagnosis not present

## 2020-01-26 DIAGNOSIS — R630 Anorexia: Secondary | ICD-10-CM | POA: Diagnosis not present

## 2020-01-27 ENCOUNTER — Encounter: Payer: Self-pay | Admitting: Internal Medicine

## 2020-01-27 ENCOUNTER — Other Ambulatory Visit: Payer: Self-pay | Admitting: *Deleted

## 2020-01-27 ENCOUNTER — Inpatient Hospital Stay: Payer: 59 | Attending: Internal Medicine

## 2020-01-27 ENCOUNTER — Inpatient Hospital Stay (HOSPITAL_BASED_OUTPATIENT_CLINIC_OR_DEPARTMENT_OTHER): Payer: 59 | Admitting: Internal Medicine

## 2020-01-27 ENCOUNTER — Other Ambulatory Visit: Payer: Self-pay

## 2020-01-27 DIAGNOSIS — C61 Malignant neoplasm of prostate: Secondary | ICD-10-CM | POA: Diagnosis not present

## 2020-01-27 DIAGNOSIS — G893 Neoplasm related pain (acute) (chronic): Secondary | ICD-10-CM | POA: Insufficient documentation

## 2020-01-27 DIAGNOSIS — Z9221 Personal history of antineoplastic chemotherapy: Secondary | ICD-10-CM | POA: Diagnosis not present

## 2020-01-27 DIAGNOSIS — E785 Hyperlipidemia, unspecified: Secondary | ICD-10-CM | POA: Diagnosis not present

## 2020-01-27 DIAGNOSIS — Z7952 Long term (current) use of systemic steroids: Secondary | ICD-10-CM | POA: Insufficient documentation

## 2020-01-27 DIAGNOSIS — Z79899 Other long term (current) drug therapy: Secondary | ICD-10-CM | POA: Diagnosis not present

## 2020-01-27 DIAGNOSIS — Z7982 Long term (current) use of aspirin: Secondary | ICD-10-CM | POA: Insufficient documentation

## 2020-01-27 DIAGNOSIS — C7951 Secondary malignant neoplasm of bone: Secondary | ICD-10-CM | POA: Diagnosis not present

## 2020-01-27 DIAGNOSIS — I10 Essential (primary) hypertension: Secondary | ICD-10-CM | POA: Diagnosis not present

## 2020-01-27 DIAGNOSIS — R630 Anorexia: Secondary | ICD-10-CM | POA: Diagnosis not present

## 2020-01-27 DIAGNOSIS — Z87891 Personal history of nicotine dependence: Secondary | ICD-10-CM | POA: Insufficient documentation

## 2020-01-27 DIAGNOSIS — I2510 Atherosclerotic heart disease of native coronary artery without angina pectoris: Secondary | ICD-10-CM | POA: Insufficient documentation

## 2020-01-27 DIAGNOSIS — R634 Abnormal weight loss: Secondary | ICD-10-CM | POA: Diagnosis not present

## 2020-01-27 LAB — COMPREHENSIVE METABOLIC PANEL
ALT: 18 U/L (ref 0–44)
AST: 16 U/L (ref 15–41)
Albumin: 3.4 g/dL — ABNORMAL LOW (ref 3.5–5.0)
Alkaline Phosphatase: 56 U/L (ref 38–126)
Anion gap: 11 (ref 5–15)
BUN: 19 mg/dL (ref 8–23)
CO2: 28 mmol/L (ref 22–32)
Calcium: 9.5 mg/dL (ref 8.9–10.3)
Chloride: 98 mmol/L (ref 98–111)
Creatinine, Ser: 0.8 mg/dL (ref 0.61–1.24)
GFR, Estimated: >60 mL/min (ref >60)
Glucose, Bld: 140 mg/dL — ABNORMAL HIGH (ref 70–99)
Potassium: 5 mmol/L (ref 3.5–5.1)
Sodium: 137 mmol/L (ref 135–145)
Total Bilirubin: 0.5 mg/dL (ref 0.3–1.2)
Total Protein: 6.4 g/dL — ABNORMAL LOW (ref 6.5–8.1)

## 2020-01-27 LAB — CBC WITH DIFFERENTIAL/PLATELET
Abs Immature Granulocytes: 0.17 10*3/uL — ABNORMAL HIGH (ref 0.00–0.07)
Basophils Absolute: 0 10*3/uL (ref 0.0–0.1)
Basophils Relative: 0 %
Eosinophils Absolute: 0 10*3/uL (ref 0.0–0.5)
Eosinophils Relative: 0 %
HCT: 38 % — ABNORMAL LOW (ref 39.0–52.0)
Hemoglobin: 12.6 g/dL — ABNORMAL LOW (ref 13.0–17.0)
Immature Granulocytes: 2 %
Lymphocytes Relative: 15 %
Lymphs Abs: 1.4 10*3/uL (ref 0.7–4.0)
MCH: 31.2 pg (ref 26.0–34.0)
MCHC: 33.2 g/dL (ref 30.0–36.0)
MCV: 94.1 fL (ref 80.0–100.0)
Monocytes Absolute: 0.5 10*3/uL (ref 0.1–1.0)
Monocytes Relative: 6 %
Neutro Abs: 7.5 10*3/uL (ref 1.7–7.7)
Neutrophils Relative %: 77 %
Platelets: 213 10*3/uL (ref 150–400)
RBC: 4.04 MIL/uL — ABNORMAL LOW (ref 4.22–5.81)
RDW: 14.4 % (ref 11.5–15.5)
WBC: 9.7 10*3/uL (ref 4.0–10.5)
nRBC: 0 % (ref 0.0–0.2)

## 2020-01-27 LAB — PSA: Prostatic Specific Antigen: 597 ng/mL — ABNORMAL HIGH (ref 0.00–4.00)

## 2020-01-27 MED ORDER — METOPROLOL TARTRATE 25 MG PO TABS: 25.0000 mg | ORAL_TABLET | Freq: Two times a day (BID) | ORAL | 1 refills | Status: DC

## 2020-01-27 NOTE — Assessment & Plan Note (Addendum)
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 ADT;  MAY 2021- PET- Improved; however PSA- rising.  Most recently on cabazitaxel s/p #2 [80% dose]- poorly tolerated [see below];  # Clinically- STABLE; continue off chemotherapy; continue hospice.  #Fatigue-STABLE;  hold IV fluids and dexamethasone. continue oral dexamethasone 2 mg p.o. twice a day [taper]  # Malignancy related pain- continue fentanyl patch hydrocodone;. Continue fentanyl patch to every 48 hours and continue oxycodone prn-STABLE.   # intermittent episodes of confusion-? Medications Vs. Elevated BP- increase metoprolol 25 mg to BID.  Monitor for now.  Again reiterated my recommendations not to drive.  *Patient preference/follow-up # DISPOSITION: # 76month - MD; labs- cbc/cmp;PSA-Dr.B

## 2020-01-27 NOTE — Progress Notes (Signed)
States he has noticed some confusion starting 01/13/20. Had a nurse come out to check on him and he was dehydrated and BP was high.   Weakness in legs. Legs has given out on him.

## 2020-01-27 NOTE — Progress Notes (Signed)
[place] OFFICE PROGRESS NOTE  Patient Care Team: Olin Hauser, [name], MD as PCP - Cardiology (Cardiology) Minna Merritts, [name], [name]  Cancer Staging No matching staging information was found for the patient.   Oncology History Overview Note  # 2011- PROSTATE CANCER [Gleason 3+4]; s/p Prostatectomy [ also involved bladder neck/ECP; Dr.Polaseck]; July 2014- Biochemical recurrence [PSA 14]- started on Zoladex [Dr.Pandit]; lost to follow up.  # JAN 2017- STAGE IV METASTATIC PROSTATE Cancer to Bone- Feb 13th, 2017-  Lupron q 65m[~end of feb]; PSA: 1021; Declined Chemo; April 2017 [xofigo x6; Dr.Crystal]; AUG 2017- Zytiga + Prednisone BID. Bone scan-Jan 2018- improved skeletal metastases.   # MAY 1st 2019- START X-tandi [stopped zytiga/PSA- 8.8/rising]  #August 23, 2018-stop Xtandi [intolerance-fatigue mental fogginess]  #Mid September 2020-apalutamide; stopped mid-October poor tolerance/progression  # 11/01/2018- -Taxotere weekly [consent]; July 2021-progression of disease/PSA   # 08/13/2019-cabazitaxel; Neulasta  # Mets to bone- start X-geva q 87M [May 30th]; hold dental issues/infections  # Smoker/ chronic pain/pain clinic   #[April 2018; [place]]- s/p stenting; on Plavix; --------------------------------------------------------------  DIAGNOSIS: '[ ]'  Castrate resistant prostate cancer  STAGE:   4    ;GOALS: Palliative  CURRENT/MOST RECENT THERAPY-cabazitaxel    Prostate cancer metastatic to bone ([facility]  12/08/2014 Initial Diagnosis   [place] cancer [place] bone ([facility]   11/01/2018 - 07/09/2019 Chemotherapy   The patient had DOCEtaxel (TAXOTERE) 70 mg in sodium chloride 0.9 % 150 mL chemo infusion, 36 mg/m2 = 70 mg, Intravenous,  Once, 19 of 21 cycles Administration: 70 mg (11/08/2018),  70 mg (11/22/2018), 70 mg (12/13/2018), 70 mg (12/06/2018), 70 mg (12/31/2018), 70 mg (01/07/2019), 70 mg (01/29/2019), 70 mg (12/20/2018), 70 mg (02/06/2019), 70 mg (02/14/2019), 70 mg (02/28/2019), 70 mg (03/14/2019), 70 mg (03/28/2019), 70 mg (04/15/2019), 70 mg (04/29/2019), 70 mg (05/27/2019), 70 mg (06/24/2019), 70 mg (07/09/2019)  for chemotherapy treatment.    08/13/2019 -  Chemotherapy   The patient had pegfilgrastim (NEULASTA) injection 6 mg, 6 mg, Subcutaneous, Once, 3 of 6 cycles Administration: 6 mg (08/14/2019), 6 mg (09/19/2019) cabazitaxel (JEVTANA) 38 mg in sodium chloride 0.9 % 250 mL chemo infusion, 20 mg/m2 = 38 mg, Intravenous,  Once, 3 of 6 cycles Dose modification: 16 mg/m2 (original dose 20 mg/m2, Cycle 3, Reason: Provider Judgment) Administration: 38 mg (08/13/2019), 30 mg (09/18/2019)  for chemotherapy treatment.     INTERVAL HISTORY:  Robert [id number].  male pleasant patient above history of metastatic castrate resistant prostate cancer currently enrolled in hospice is here for follow-up.  Overall patient continues to feel poorly given his chronic fatigue shortness of breath on exertion.  However appetite is improved on dexamethasone 2 mg twice a day.   No fever no chills.  No nausea no vomiting.  Back pain is better controlled on narcotic pain prescriptions.  Review of Systems  Constitutional: Positive for malaise/fatigue and weight loss. Negative for chills, diaphoresis and fever.  HENT: Negative for nosebleeds and sore throat.   Eyes: Negative for double vision.  Respiratory: Positive for shortness of breath. Negative for cough, hemoptysis, sputum production and wheezing.   Cardiovascular: Negative for chest pain, palpitations and orthopnea.  Gastrointestinal: Positive for nausea. Negative for abdominal pain, blood in stool, diarrhea, heartburn, melena and vomiting.  Genitourinary: Negative for dysuria, frequency and urgency.  Musculoskeletal: Positive for back pain and  joint  pain.  Skin: Negative for itching.  Neurological: Negative for dizziness, tingling, focal weakness, weakness and headaches.  Endo/Heme/Allergies: Does not bruise/bleed easily.  Psychiatric/Behavioral: Negative for depression. The patient is not nervous/anxious and does not have insomnia.       PAST MEDICAL HISTORY :  Past Medical History:  Diagnosis Date  . Back pain 10/09/2012  . Bone cancer ([place])   . CAD (coronary artery disease)    a. 08/2015 PCI: LM nl, LAD 20d, LCX nl, RCA 31m RPAV 95 (3.0x18 Xience Alpine DES);  b. 04/2016 PCI ([facility]: RPL 95 (2.75x8 Promus Premier DES).  . Cancer associated pain   . Depression   . History of echocardiogram    a. 08/2016 Echo: EF 50-55%.  .Marland KitchenHistory of kidney stones   . Hyperlipidemia   . Hypertension   . Joint pain   . Prostate cancer ([facility]    a.  s/p prostatectomy ([facility]);  b. bone mets noted 04/2016.  . Right arm pain 01/10/2016  . Right foot pain 01/10/2016  . Right leg pain 01/10/2016  . Tobacco abuse     PAST SURGICAL HISTORY :   Past Surgical History:  Procedure Laterality Date  . CARDIAC CATHETERIZATION     [facility]  . CARDIAC CATHETERIZATION N/A 08/16/2015   Procedure: Left Heart Cath and Coronary Angiography;  Surgeon: DYolonda Kida MD;  [facility];  Service: Cardiovascular;  Laterality: N/A;  . CARDIAC CATHETERIZATION N/A 08/16/2015   Procedure: Coronary Stent Intervention;  Surgeon: DYolonda Kida MD;  [facility];  Service: Cardiovascular;  Laterality: N/A;  . CERVIX SURGERY    . PROSTATECTOMY    . SPINE SURGERY    . TONSILLECTOMY    . WRIST SURGERY      FAMILY HISTORY :   Family History  Problem Relation Age of Onset  . Heart attack Mother   . Hypertension Mother   . Heart attack Father     SOCIAL HISTORY:   Social History   Tobacco Use  . Smoking status: Former Smoker    Packs/day: 0.50    Years: 40.00    Pack years: 20.00    Types: Cigarettes  . Smokeless  tobacco: Current User    Types: Chew  . Tobacco comment: 1 pack every couple days   Vaping Use  . Vaping Use: Never used  Substance Use Topics  . Alcohol use: Yes    Comment: occasionally  . Drug use: No    ALLERGIES:  is allergic to ditropan [oxybutynin].  MEDICATIONS:  Current Outpatient Medications  Medication Sig Dispense Refill  . acetaminophen (TYLENOL) 500 MG tablet Take 1,000 mg by mouth every 8 (eight) hours as needed.     .Marland Kitchenalbuterol (PROVENTIL HFA;VENTOLIN HFA) 108 (90 Base) MCG/ACT inhaler Inhale 1-2 puffs into the lungs every 6 (six) hours as needed for wheezing or shortness of breath. 1 Inhaler 0  . aspirin 81 MG tablet Take 81 mg by mouth daily.    .Marland Kitchenatorvastatin (LIPITOR) 80 MG tablet Take 1 tablet (80 mg total) by mouth daily at 6 PM. 90 tablet 3  . clopidogrel (PLAVIX) 75 MG tablet Take 1 tablet (75 mg total) by mouth daily. 90 tablet 3  . dexamethasone (DECADRON) 4 MG tablet Take 1 tablet (4 mg total) by mouth 2 (two) times daily with a meal. 30 tablet 1  . fentaNYL (DURAGESIC) 100 MCG/HR Place 1 patch onto the skin every other day. Along with  61mg patch for total of 150 mcg every 2 days. 15 patch 0  . fentaNYL (DURAGESIC) 50 MCG/HR Place 1 patch onto the skin every other day. Along with 1021m patch for total of 150 mcg every 2 days. 15 patch 0  . gabapentin (NEURONTIN) 300 MG capsule Take 1 capsule (300 mg total) by mouth 3 (three) times daily. 90 capsule 2  . magic mouthwash SOLN Take 5 mLs by mouth 4 (four) times daily. 240 mL 0  . naloxone (NARCAN) nasal spray 4 mg/0.1 mL 1 spray into nostril upon signs of opioid overdose. Call 911. May repeat once if no response within 2-3 minutes. 1 kit 0  . nitroGLYCERIN (NITROSTAT) 0.4 MG SL tablet Place 1 tablet (0.4 mg total) under the tongue every 5 (five) minutes as needed. 25 tablet 3  . ondansetron (ZOFRAN) 8 MG tablet Take 8 mg by mouth every 8 (eight) hours as needed for nausea or vomiting.    . Marland KitchenxyCODONE (ROXICODONE)  15 MG immediate release tablet Take 1 tablet (15 mg total) by mouth every 6 (six) hours as needed. for pain 90 tablet 0  . polyethylene glycol (MIRALAX / GLYCOLAX) packet Take 17 g by mouth daily as needed for moderate constipation.     . promethazine (PHENERGAN) 25 MG tablet TAKE 1/2 TO 1 TABLET (12.5-25 MG TOTAL) BY MOUTH EVERY 8 (EIGHT) HOURS AS NEEDED FOR NAUSEA OR VOMITING. 60 tablet 3  . traZODone (DESYREL) 50 MG tablet Take 1-2 tablets (50-100 mg total) by mouth at bedtime as needed for sleep. 60 tablet 2  . LORazepam (ATIVAN) 0.5 MG tablet Take 1 tablet (0.5 mg total) by mouth every 4 (four) hours as needed for anxiety. 60 tablet 0  . metoprolol tartrate (LOPRESSOR) 25 MG tablet Take 1 tablet (25 mg total) by mouth 2 (two) times daily. 60 tablet 1   No current facility-administered medications for this visit.    PHYSICAL EXAMINATION: ECOG PERFORMANCE STATUS: 1 - Symptomatic but completely ambulatory  BP (!) 141/102 (BP Location: Left Arm, Patient Position: Sitting, Cuff Size: Normal)   Pulse 91   Temp (!) 97.2 F (36.2 C) (Tympanic)   Resp 16   Ht '5\' 11"'  (1.803 m)   Wt 154 lb 12.8 oz (70.2 kg)   SpO2 99%   BMI 21.59 kg/m   Filed Weights   01/27/20 1453  Weight: 154 lb 12.8 oz (70.2 kg)    Physical Exam Constitutional:      Comments:  Frail-appearing Caucasian male patient.  He is in a wheelchair.. Marland KitchenHe is accompanied by his wife.  HENT:     Head: Normocephalic and atraumatic.     Mouth/Throat:     Pharynx: No oropharyngeal exudate.  Eyes:     Pupils: Pupils are equal, round, and reactive to light.  Cardiovascular:     Rate and Rhythm: Normal rate and regular rhythm.  Pulmonary:     Effort: No respiratory distress.     Breath sounds: No wheezing.  Abdominal:     General: Bowel sounds are normal. There is no distension.     Palpations: Abdomen is soft. There is no mass.     Tenderness: There is no abdominal tenderness. There is no guarding or rebound.   Musculoskeletal:        General: No tenderness. Normal range of motion.     Cervical back: Normal range of motion and neck supple.     Comments: .  Skin:    General: Skin is  warm.     Comments: Macular rash on hands  Neurological:     Mental Status: He is alert and oriented to person, place, and time.  Psychiatric:        Mood and Affect: Affect normal.     LABORATORY DATA:  I have reviewed the data as listed    Component Value Date/Time   NA 139 01/30/2020 1850   NA 140 07/13/2013 1542   K 3.6 01/30/2020 1850   K 4.3 07/13/2013 1542   CL 103 01/30/2020 1850   CL 107 07/13/2013 1542   CO2 25 01/30/2020 1850   CO2 26 07/13/2013 1542   GLUCOSE 117 (H) 01/30/2020 1850   GLUCOSE 106 (H) 07/13/2013 1542   BUN 12 01/30/2020 1850   BUN 11 07/13/2013 1542   CREATININE 0.80 01/30/2020 1850   CREATININE 0.76 10/15/2017 1001   CALCIUM 9.8 01/30/2020 1850   CALCIUM 9.9 07/13/2013 1542   PROT 6.4 (L) 01/27/2020 1435   PROT 8.1 07/13/2013 1542   ALBUMIN 3.4 (L) 01/27/2020 1435   ALBUMIN 4.3 07/13/2013 1542   AST 16 01/27/2020 1435   AST 26 07/13/2013 1542   ALT 18 01/27/2020 1435   ALT 32 07/13/2013 1542   ALKPHOS 56 01/27/2020 1435   ALKPHOS 74 07/13/2013 1542   BILITOT 0.5 01/27/2020 1435   BILITOT 0.4 07/13/2013 1542   GFRNONAA >60 01/30/2020 1850   GFRNONAA 95 10/15/2017 1001   GFRAA >60 10/03/2019 1349   GFRAA 110 10/15/2017 1001    No results found for: SPEP, UPEP  Lab Results  Component Value Date   WBC 9.2 01/30/2020   NEUTROABS 7.5 01/27/2020   HGB 12.7 (L) 01/30/2020   HCT 38.8 (L) 01/30/2020   MCV 94.2 01/30/2020   PLT 195 01/30/2020      Chemistry      Component Value Date/Time   NA 139 01/30/2020 1850   NA 140 07/13/2013 1542   K 3.6 01/30/2020 1850   K 4.3 07/13/2013 1542   CL 103 01/30/2020 1850   CL 107 07/13/2013 1542   CO2 25 01/30/2020 1850   CO2 26 07/13/2013 1542   BUN 12 01/30/2020 1850   BUN 11 07/13/2013 1542   CREATININE 0.80  01/30/2020 1850   CREATININE 0.76 10/15/2017 1001      Component Value Date/Time   CALCIUM 9.8 01/30/2020 1850   CALCIUM 9.9 07/13/2013 1542   ALKPHOS 56 01/27/2020 1435   ALKPHOS 74 07/13/2013 1542   AST 16 01/27/2020 1435   AST 26 07/13/2013 1542   ALT 18 01/27/2020 1435   ALT 32 07/13/2013 1542   BILITOT 0.5 01/27/2020 1435   BILITOT 0.4 07/13/2013 1542     RADIOGRAPHIC STUDIES: I have personally reviewed the radiological images as listed and agreed with the findings in the report. No results found.   ASSESSMENT & PLAN:  Prostate cancer metastatic to bone ([place]) # Castrate resistant prostate cancer metastases to bone. ADT;  MAY 2021- PET- Improved; however PSA- rising.  Most recently on cabazitaxel s/p #2 [80% dose]- poorly tolerated [see below];  # Clinically- STABLE; continue off chemotherapy; continue hospice.  #Fatigue-STABLE;  hold IV fluids and dexamethasone. continue oral dexamethasone 2 mg p.o. twice a day [taper]  # Malignancy related pain- continue fentanyl patch hydrocodone;. Continue fentanyl patch to every 48 hours and continue oxycodone prn-STABLE.   # intermittent episodes of confusion-? Medications Vs. Elevated BP- increase metoprolol 25 mg to BID.  Monitor for now.  Again reiterated  my recommendations not to drive.  *Patient preference/follow-up # DISPOSITION: # 32month- MD; labs- cbc/cmp;PSA-Dr.B      Orders Placed This Encounter  Procedures  . CBC with Differential/Platelet    Standing Status:   Future    Standing Expiration Date:   01/26/2021  . Comprehensive metabolic panel    Standing Status:   Future    Standing Expiration Date:   01/26/2021  . PSA    Standing Status:   Future    Standing Expiration Date:   01/26/2021   All questions were answered. The patient knows to call the clinic with any problems, questions or concerns.      GCammie Sickle MD 02/08/2020 9:18 AM

## 2020-01-28 DIAGNOSIS — R630 Anorexia: Secondary | ICD-10-CM | POA: Diagnosis not present

## 2020-01-28 DIAGNOSIS — C61 Malignant neoplasm of prostate: Secondary | ICD-10-CM | POA: Diagnosis not present

## 2020-01-28 DIAGNOSIS — R634 Abnormal weight loss: Secondary | ICD-10-CM | POA: Diagnosis not present

## 2020-01-28 DIAGNOSIS — C7951 Secondary malignant neoplasm of bone: Secondary | ICD-10-CM | POA: Diagnosis not present

## 2020-01-29 DIAGNOSIS — R630 Anorexia: Secondary | ICD-10-CM | POA: Diagnosis not present

## 2020-01-29 DIAGNOSIS — R634 Abnormal weight loss: Secondary | ICD-10-CM | POA: Diagnosis not present

## 2020-01-29 DIAGNOSIS — C61 Malignant neoplasm of prostate: Secondary | ICD-10-CM | POA: Diagnosis not present

## 2020-01-29 DIAGNOSIS — C7951 Secondary malignant neoplasm of bone: Secondary | ICD-10-CM | POA: Diagnosis not present

## 2020-01-30 ENCOUNTER — Emergency Department: Payer: 59

## 2020-01-30 ENCOUNTER — Emergency Department
Admission: EM | Admit: 2020-01-30 | Discharge: 2020-01-30 | Disposition: A | Discharge status: Home / Self Care | Payer: 59 | Attending: Emergency Medicine | Admitting: Emergency Medicine

## 2020-01-30 ENCOUNTER — Other Ambulatory Visit: Payer: Self-pay

## 2020-01-30 ENCOUNTER — Telehealth: Payer: Self-pay | Admitting: Hospice and Palliative Medicine

## 2020-01-30 DIAGNOSIS — F419 Anxiety disorder, unspecified: Secondary | ICD-10-CM | POA: Diagnosis not present

## 2020-01-30 DIAGNOSIS — I10 Essential (primary) hypertension: Secondary | ICD-10-CM | POA: Diagnosis not present

## 2020-01-30 DIAGNOSIS — M542 Cervicalgia: Secondary | ICD-10-CM | POA: Diagnosis not present

## 2020-01-30 DIAGNOSIS — R630 Anorexia: Secondary | ICD-10-CM | POA: Diagnosis not present

## 2020-01-30 DIAGNOSIS — Z7982 Long term (current) use of aspirin: Secondary | ICD-10-CM | POA: Insufficient documentation

## 2020-01-30 DIAGNOSIS — R0789 Other chest pain: Secondary | ICD-10-CM | POA: Diagnosis not present

## 2020-01-30 DIAGNOSIS — R58 Hemorrhage, not elsewhere classified: Secondary | ICD-10-CM | POA: Diagnosis not present

## 2020-01-30 DIAGNOSIS — F17220 Nicotine dependence, chewing tobacco, uncomplicated: Secondary | ICD-10-CM | POA: Insufficient documentation

## 2020-01-30 DIAGNOSIS — Z7902 Long term (current) use of antithrombotics/antiplatelets: Secondary | ICD-10-CM | POA: Insufficient documentation

## 2020-01-30 DIAGNOSIS — R079 Chest pain, unspecified: Secondary | ICD-10-CM | POA: Insufficient documentation

## 2020-01-30 DIAGNOSIS — R519 Headache, unspecified: Secondary | ICD-10-CM | POA: Insufficient documentation

## 2020-01-30 DIAGNOSIS — C7951 Secondary malignant neoplasm of bone: Secondary | ICD-10-CM | POA: Diagnosis not present

## 2020-01-30 DIAGNOSIS — R5381 Other malaise: Secondary | ICD-10-CM | POA: Diagnosis not present

## 2020-01-30 DIAGNOSIS — M79602 Pain in left arm: Secondary | ICD-10-CM | POA: Insufficient documentation

## 2020-01-30 DIAGNOSIS — R0602 Shortness of breath: Secondary | ICD-10-CM | POA: Insufficient documentation

## 2020-01-30 DIAGNOSIS — I2510 Atherosclerotic heart disease of native coronary artery without angina pectoris: Secondary | ICD-10-CM | POA: Insufficient documentation

## 2020-01-30 DIAGNOSIS — R634 Abnormal weight loss: Secondary | ICD-10-CM | POA: Diagnosis not present

## 2020-01-30 DIAGNOSIS — Z79899 Other long term (current) drug therapy: Secondary | ICD-10-CM | POA: Diagnosis not present

## 2020-01-30 DIAGNOSIS — C61 Malignant neoplasm of prostate: Secondary | ICD-10-CM | POA: Diagnosis not present

## 2020-01-30 LAB — BASIC METABOLIC PANEL
Anion gap: 11 (ref 5–15)
BUN: 12 mg/dL (ref 8–23)
CO2: 25 mmol/L (ref 22–32)
Calcium: 9.8 mg/dL (ref 8.9–10.3)
Chloride: 103 mmol/L (ref 98–111)
Creatinine, Ser: 0.8 mg/dL (ref 0.61–1.24)
GFR, Estimated: >60 mL/min (ref >60)
Glucose, Bld: 117 mg/dL — ABNORMAL HIGH (ref 70–99)
Potassium: 3.6 mmol/L (ref 3.5–5.1)
Sodium: 139 mmol/L (ref 135–145)

## 2020-01-30 LAB — CBC
HCT: 38.8 % — ABNORMAL LOW (ref 39.0–52.0)
Hemoglobin: 12.7 g/dL — ABNORMAL LOW (ref 13.0–17.0)
MCH: 30.8 pg (ref 26.0–34.0)
MCHC: 32.7 g/dL (ref 30.0–36.0)
MCV: 94.2 fL (ref 80.0–100.0)
Platelets: 195 10*3/uL (ref 150–400)
RBC: 4.12 MIL/uL — ABNORMAL LOW (ref 4.22–5.81)
RDW: 13.9 % (ref 11.5–15.5)
WBC: 9.2 10*3/uL (ref 4.0–10.5)
nRBC: 0 % (ref 0.0–0.2)

## 2020-01-30 LAB — TROPONIN I (HIGH SENSITIVITY): Troponin I (High Sensitivity): 15 ng/L (ref <18)

## 2020-01-30 MED ORDER — LORAZEPAM 0.5 MG PO TABS
0.5000 mg | ORAL_TABLET | Freq: Once | ORAL | Status: AC
  Administered 2020-01-30: 0.5 mg via ORAL
  Filled 2020-01-30: qty 1

## 2020-01-30 MED ORDER — LORAZEPAM 2 MG/ML IJ SOLN
0.5000 mg | Freq: Once | INTRAMUSCULAR | Status: AC
  Administered 2020-01-30: 0.5 mg via INTRAVENOUS
  Filled 2020-01-30: qty 1

## 2020-01-30 NOTE — ED Triage Notes (Signed)
FIRST NURSE: Pt to ER via EMS with c/o chest and left arm pain.  Per EMS pt is hospice pt and would not allow EMS to give any treatment including EKG and VS.  States he just wanted them to bring him to the hospital to see a Dr. Abbott Pao has fentanyl patch and had PO morphine approx 1 hour ago.

## 2020-01-30 NOTE — ED Notes (Signed)
Pt states "why does everyone at this hospital have a bad attitude" when this RN asks patient what arm would be best to obtain lab work. This RN told patient that the blood work would only be obtained if he consented to it, Pt then states "I don't care if you get it or not". This RN asked again if it would be okay to obtain lab work, pt states "I guess I will just find my shoes and go home". Pt return to the lobby.

## 2020-01-30 NOTE — ED Notes (Addendum)
Pt wife called and informed pt left DNR form in ED. Pt wife verbalized that she would come to get it in the morning. Form taken to front desk.

## 2020-01-30 NOTE — Telephone Encounter (Signed)
Received a call from patient's hospice nurse.  Patient was having worse pain in back and neck.  Nurse had given dose of morphine elixir 5 mg.  She could not locate transdermal fentanyl patch on patient and it is unclear when and how that was removed.  However, wife states that patches were due to be changed today.  I suggested that they apply new fentanyl patches (150 mcg) and can liberalize oxycodone 15 mg every 3-4 hours as needed for breakthrough pain.  I recommended hospice reach out to patient over the weekend to assess pain.

## 2020-01-30 NOTE — ED Notes (Signed)
Pt to xray

## 2020-01-30 NOTE — ED Provider Notes (Signed)
[facility] Emergency Department Provider Note   ____________________________________________   Event Date/Time   First MD Initiated Contact with Patient 01/30/20 1705     (approximate)  I have reviewed the triage vital signs and the nursing notes.   HISTORY  Chief Complaint Chest Pain    HPI Robert Short is a 70 y.o. male with a stated past medical history of prostate cancer with bony metastasis and on hospice, chronic neck pain, chronic back pain, chronic arm pain, tobacco abuse, and hypertension who presents for neck pain, back pain, and left arm pain that began approximately 2 hours prior to arrival and is associated with shortness of breath.  Patient states that he became very "overwhelmed" by all of these symptoms but when his home health care nurse "yelled at me" symptoms started to resolve.  Patient then states that the pain has come back and has gotten even worse.  Patient describes 10/10 aching, nonradiating neck pain, left upper extremity pain, and left chest pain that has been stable since onset and has no exacerbating or relieving factors.  Patient currently denies any vision changes, tinnitus, difficulty speaking, facial droop, sore throat, abdominal pain, nausea/vomiting/diarrhea, dysuria, or weakness/numbness/paresthesias in any extremity         Past Medical History:  Diagnosis Date  . Back pain 10/09/2012  . Bone cancer ([place])   . CAD (coronary artery disease)    a. 08/2015 PCI: LM nl, LAD 20d, LCX nl, RCA 34m, RPAV 95 (3.0x18 Xience Alpine DES);  b. 04/2016 PCI [facility]): RPL 95 (2.75x8 Promus Premier DES).  . Cancer associated pain   . Depression   . History of echocardiogram    a. 08/2016 Echo: EF 50-55%.  Marland Kitchen History of kidney stones   . Hyperlipidemia   . Hypertension   . Joint pain   . Prostate cancer [facility])    a.  s/p prostatectomy ([facility]);  b. bone mets noted 04/2016.  . Right arm pain 01/10/2016  . Right foot pain 01/10/2016  .  Right leg pain 01/10/2016  . Tobacco abuse     Patient Active Problem List   Diagnosis Date Noted  . Goals of care, counseling/discussion 10/18/2018  . Severe major depression, single episode, without psychotic features ([place]) 05/01/2018  . Suicidal ideation 04/30/2018  . Reaction, adjustment, with anxious, depressed mood 04/30/2018  . Chest pain 08/02/2016  . Pre-diabetes 07/18/2016  . Stable angina ([place]) 04/21/2016  . Nausea 01/17/2016  . History of ST elevation myocardial infarction (STEMI) 08/16/2015  . Neoplasm related pain 06/22/2015  . Cancer associated pain 02/04/2015  . Sebaceous cyst 01/18/2015  . Chronic neck pain 12/08/2014  . Cervical spondylosis 12/08/2014  . Chronic low back pain 12/08/2014  . Lumbar spondylosis 12/08/2014  . Chronic shoulder pain 12/08/2014  . Prostate cancer metastatic to bone ([place]) 12/08/2014  . Cancer-related pain 12/08/2014  . Neurogenic pain 12/08/2014  . Musculoskeletal pain 12/08/2014  . Chronic pain 12/07/2014  . Long term current use of opiate analgesic 12/07/2014  . Long term prescription opiate use 12/07/2014  . Opiate use 12/07/2014  . Encounter for therapeutic drug level monitoring 12/07/2014  . Encounter for chronic pain management 12/07/2014  . Therapeutic opioid-induced constipation (OIC) 12/07/2014  . Fatigue 08/27/2014  . CFIDS (chronic fatigue and immune dysfunction syndrome) ([place]) 06/04/2014  . Cervical spinal stenosis 06/04/2014  . Clinical depression 06/04/2014  . Essential (primary) hypertension 06/04/2014  . HLD (hyperlipidemia) 06/04/2014  . Amnesia 06/04/2014  . Chronic cervical  pain 06/04/2014  . Neuropathy ([place]) 06/04/2014  . Loss of feeling or sensation 06/04/2014  . Pain in shoulder 06/04/2014  . Compulsive tobacco user syndrome 06/04/2014  . CAD (coronary artery disease) 10/09/2012  . Smoker 10/09/2012  . ED (erectile dysfunction) of organic origin 08/08/2012  . Acid reflux 08/08/2012  . Lower urinary  tract symptoms 08/08/2012  . Restless leg 08/01/2010  . Back ache 08/01/2010    Past Surgical History:  Procedure Laterality Date  . CARDIAC CATHETERIZATION     [facility]  . CARDIAC CATHETERIZATION N/A 08/16/2015   Procedure: Left Heart Cath and Coronary Angiography;  Surgeon: Yolonda Kida, MD;  Location: [place] [facility];  Service: Cardiovascular;  Laterality: N/A;  . CARDIAC CATHETERIZATION N/A 08/16/2015   Procedure: Coronary Stent Intervention;  Surgeon: Yolonda Kida, MD;  Location: [place] [facility];  Service: Cardiovascular;  Laterality: N/A;  . CERVIX SURGERY    . PROSTATECTOMY    . SPINE SURGERY    . TONSILLECTOMY    . WRIST SURGERY      Prior to Admission medications   Medication Sig Start Date End Date Taking? Authorizing Provider  acetaminophen (TYLENOL) 500 MG tablet Take 1,000 mg by mouth every 8 (eight) hours as needed.  12/03/12   [provider]  albuterol (PROVENTIL HFA;VENTOLIN HFA) 108 (90 Base) MCG/ACT inhaler Inhale 1-2 puffs into the lungs every 6 (six) hours as needed for wheezing or shortness of breath. 08/10/16   Mikey College, NP  aspirin 81 MG tablet Take 81 mg by mouth daily.    [provider]  atorvastatin (LIPITOR) 80 MG tablet Take 1 tablet (80 mg total) by mouth daily at 6 PM. 01/15/19   Loel Dubonnet, NP  clopidogrel (PLAVIX) 75 MG tablet Take 1 tablet (75 mg total) by mouth daily. 01/15/19   Loel Dubonnet, NP  dexamethasone (DECADRON) 4 MG tablet Take 1 tablet (4 mg total) by mouth 2 (two) times daily with a meal. 12/26/19   Lequita Asal, MD  fentaNYL (DURAGESIC) 100 MCG/HR Place 1 patch onto the skin every other day. Along with 90mcg patch for total of 150 mcg every 2 days. 01/19/20   Borders, Kirt Boys, NP  fentaNYL (DURAGESIC) 50 MCG/HR Place 1 patch onto the skin every other day. Along with 163mcg patch for total of 150 mcg every 2 days. 01/19/20   Borders, Kirt Boys, NP  gabapentin (NEURONTIN) 300 MG  capsule Take 1 capsule (300 mg total) by mouth 3 (three) times daily. 10/23/19   Borders, Kirt Boys, NP  magic mouthwash SOLN Take 5 mLs by mouth 4 (four) times daily. 10/03/19   [name], MD  metoprolol tartrate (LOPRESSOR) 25 MG tablet Take 1 tablet (25 mg total) by mouth 2 (two) times daily. 01/27/20   [name], MD  naloxone [facility]) nasal spray 4 mg/0.1 mL 1 spray into nostril upon signs of opioid overdose. Call 911. May repeat once if no response within 2-3 minutes. 05/07/18   Borders, Kirt Boys, NP  nitroGLYCERIN (NITROSTAT) 0.4 MG SL tablet Place 1 tablet (0.4 mg total) under the tongue every 5 (five) minutes as needed. 01/15/19   Loel Dubonnet, NP  ondansetron (ZOFRAN) 8 MG tablet Take 8 mg by mouth every 8 (eight) hours as needed for nausea or vomiting.    [provider]  oxyCODONE (ROXICODONE) 15 MG immediate release tablet Take 1 tablet (15 mg total) by mouth every 6 (six) hours as  needed. for pain 01/19/20   Borders, Kirt Boys, NP  polyethylene glycol (MIRALAX / GLYCOLAX) packet Take 17 g by mouth daily as needed for moderate constipation.     [provider]  promethazine (PHENERGAN) 25 MG tablet TAKE 1/2 TO 1 TABLET (12.5-25 MG TOTAL) BY MOUTH EVERY 8 (EIGHT) HOURS AS NEEDED FOR NAUSEA OR VOMITING. 03/28/19   [name], MD  traZODone (DESYREL) 50 MG tablet Take 1-2 tablets (50-100 mg total) by mouth at bedtime as needed for sleep. 11/01/18   Borders, Kirt Boys, NP    Allergies Ditropan [oxybutynin]  Family History  Problem Relation Age of Onset  . Heart attack Mother   . Hypertension Mother   . Heart attack Father     Social History Social History   Tobacco Use  . Smoking status: Former Smoker    Packs/day: 0.50    Years: 40.00    Pack years: 20.00    Types: Cigarettes  . Smokeless tobacco: Current User    Types: Chew  . Tobacco comment: 1 pack every couple days   Vaping Use  . Vaping Use: Never used  Substance Use  Topics  . Alcohol use: Yes    Comment: occasionally  . Drug use: No    Review of Systems Constitutional: No fever/chills Eyes: No visual changes. ENT: No sore throat.  Endorses posterior neck pain Cardiovascular: Endorses chest pain. Respiratory: Endorses shortness of breath. Gastrointestinal: No abdominal pain.  No nausea, no vomiting.  No diarrhea. Genitourinary: Negative for dysuria. Musculoskeletal: Negative for acute arthralgias but endorses worsening of chronic neck and back pain.  Endorses left arm pain Skin: Negative for rash. Neurological: Negative for headaches, weakness/numbness/paresthesias in any extremity Psychiatric: Negative for suicidal ideation/homicidal ideation   ____________________________________________   PHYSICAL EXAM:  VITAL SIGNS: ED Triage Vitals  Enc Vitals Group     BP 01/30/20 1626 (!) 179/117     Pulse Rate 01/30/20 1626 93     Resp 01/30/20 1626 20     Temp 01/30/20 1626 99.1 F (37.3 C)     Temp Source 01/30/20 1626 Oral     SpO2 01/30/20 1626 97 %     Weight 01/30/20 1632 143 lb (64.9 kg)     Height 01/30/20 1632 5\' 11"  (1.803 m)     Head Circumference --      Peak Flow --      Pain Score 01/30/20 1628 10     Pain Loc --      Pain Edu? --      Excl. in [place]? --    Constitutional: Alert and oriented. Well appearing and in no acute distress. Eyes: Conjunctivae are normal. PERRL. Head: Atraumatic. Nose: No congestion/rhinnorhea. Mouth/Throat: Mucous membranes are moist. Neck: No stridor Cardiovascular: Grossly normal heart sounds.  Good peripheral circulation. Respiratory: Normal respiratory effort.  No retractions. Gastrointestinal: Soft and nontender. No distention. Musculoskeletal: No obvious deformities Neurologic:  Normal speech and language. No gross focal neurologic deficits are appreciated. Skin:  Skin is warm and dry. No rash noted.  Multiple areas of ecchymosis appreciated to bilateral upper extremities Psychiatric: Mood  and affect are normal. Speech and behavior are normal.  ____________________________________________   LABS (all labs ordered are listed, but only abnormal results are displayed)  Labs Reviewed  CBC - Abnormal; Notable for the following components:      Result Value   RBC 4.12 (*)    Hemoglobin 12.7 (*)    HCT 38.8 (*)  All other components within normal limits  BASIC METABOLIC PANEL  TROPONIN I (HIGH SENSITIVITY)  TROPONIN I (HIGH SENSITIVITY)   ____________________________________________  EKG  ED ECG REPORT I, Naaman Plummer, the attending physician, personally viewed and interpreted this ECG.  Date: 01/30/2020 EKG Time: 1617 Rate: 91 Rhythm: normal sinus rhythm QRS Axis: normal Intervals: normal ST/T Wave abnormalities: normal Narrative Interpretation: no evidence of acute ischemia  ____________________________________________  RADIOLOGY  ED MD interpretation: 2 view chest x-ray shows no evidence of acute abnormalities including no pneumonia, pneumothorax, or widened mediastinum  CT without contrast of the head shows no evidence of acute intracranial hemorrhage, edema.  Does redemonstrate multiple skull metastasis as seen on prior MRI  Official radiology report(s): DG Chest 2 View  Result Date: 01/30/2020 CLINICAL DATA:  Chest pain, shortness of breath EXAM: CHEST - 2 VIEW COMPARISON:  08/02/2016 FINDINGS: Heart is normal size. Heart and mediastinal contours are within normal limits. No focal opacities or effusions. No acute bony abnormality. Aortic atherosclerosis. IMPRESSION: No active cardiopulmonary disease. Electronically Signed   By: Rolm Baptise M.D.   On: 01/30/2020 19:19   CT Head Wo Contrast  Result Date: 01/30/2020 CLINICAL DATA:  Headache 12/19/2017 EXAM: CT HEAD WITHOUT CONTRAST TECHNIQUE: Contiguous axial images were obtained from the base of the skull through the vertex without intravenous contrast. COMPARISON:  MRI 03/07/2019 FINDINGS: Brain: No  acute intracranial abnormality. Specifically, no hemorrhage, hydrocephalus, mass lesion, acute infarction, or significant intracranial injury. Vascular: No hyperdense vessel or unexpected calcification. Skull: Multiple sclerotic lesions throughout the skull compatible with metastases as described on prior MRI. Sinuses/Orbits: Visualized paranasal sinuses and mastoids clear. Orbital soft tissues unremarkable. Other: None IMPRESSION: No acute intracranial abnormality. Multiple skull metastases as seen on prior MRI. Electronically Signed   By: Rolm Baptise M.D.   On: 01/30/2020 19:24    ____________________________________________   PROCEDURES  Procedure(s) performed (including Critical Care):  .1-3 Lead EKG Interpretation Performed by: Naaman Plummer, MD Authorized by: Naaman Plummer, MD     Interpretation: normal     ECG rate:  92   ECG rate assessment: normal     Rhythm: sinus rhythm     Ectopy: none     Conduction: normal       ____________________________________________   INITIAL IMPRESSION / ASSESSMENT AND PLAN / ED COURSE  As part of my medical decision making, I reviewed the following data within the [place] notes reviewed and incorporated, Labs reviewed, EKG interpreted, Old chart reviewed, Radiograph reviewed and Notes from prior ED visits reviewed and incorporated        Workup: ECG, CXR, CBC, BMP, Troponin Findings: ECG: No overt evidence of STEMI. No evidence of Brugadas sign, delta wave, epsilon wave, significantly prolonged QTc, or malignant arrhythmia HS Troponin: Negative x1 Other Labs unremarkable for emergent problems. CXR: Without PTX, PNA, or widened mediastinum HEART Score: 2  Given History, Exam, and Workup I have low suspicion for ACS, Pneumothorax, Pneumonia, Pulmonary Embolus, Tamponade, Aortic Dissection or other emergent problem as a cause for this presentation.   Reassesment: Prior to discharge patients pain was  controlled and they were well appearing.  Disposition:  Discharge. Strict return precautions discussed with patient with full understanding. Advised patient to follow up promptly with primary care provider      ____________________________________________   FINAL CLINICAL IMPRESSION(S) / ED DIAGNOSES  Final diagnoses:  None     ED Discharge Orders    None  Note:  This document was prepared using Dragon voice recognition software and may include unintentional dictation errors.   Naaman Plummer, MD 01/30/20 2036

## 2020-01-30 NOTE — ED Notes (Signed)
Pt refusing X-ray. States "I'm not doing anything until I see a doctor." MD notified

## 2020-01-30 NOTE — ED Triage Notes (Addendum)
Pt to ER via [facility] from home.   Pt reports onset of chest pain and neck pain/ L arm pain around two hours ago. Pt also c/o shortness of breath. No other symptoms. Denies nausea.

## 2020-01-31 DIAGNOSIS — R634 Abnormal weight loss: Secondary | ICD-10-CM | POA: Diagnosis not present

## 2020-01-31 DIAGNOSIS — R630 Anorexia: Secondary | ICD-10-CM | POA: Diagnosis not present

## 2020-01-31 DIAGNOSIS — C61 Malignant neoplasm of prostate: Secondary | ICD-10-CM | POA: Diagnosis not present

## 2020-01-31 DIAGNOSIS — C7951 Secondary malignant neoplasm of bone: Secondary | ICD-10-CM | POA: Diagnosis not present

## 2020-02-01 DIAGNOSIS — C7951 Secondary malignant neoplasm of bone: Secondary | ICD-10-CM | POA: Diagnosis not present

## 2020-02-01 DIAGNOSIS — C61 Malignant neoplasm of prostate: Secondary | ICD-10-CM | POA: Diagnosis not present

## 2020-02-01 DIAGNOSIS — R634 Abnormal weight loss: Secondary | ICD-10-CM | POA: Diagnosis not present

## 2020-02-01 DIAGNOSIS — R630 Anorexia: Secondary | ICD-10-CM | POA: Diagnosis not present

## 2020-02-02 DIAGNOSIS — R634 Abnormal weight loss: Secondary | ICD-10-CM | POA: Diagnosis not present

## 2020-02-02 DIAGNOSIS — C7951 Secondary malignant neoplasm of bone: Secondary | ICD-10-CM | POA: Diagnosis not present

## 2020-02-02 DIAGNOSIS — C61 Malignant neoplasm of prostate: Secondary | ICD-10-CM | POA: Diagnosis not present

## 2020-02-02 DIAGNOSIS — R630 Anorexia: Secondary | ICD-10-CM | POA: Diagnosis not present

## 2020-02-03 ENCOUNTER — Other Ambulatory Visit: Payer: Self-pay | Admitting: Hospice and Palliative Medicine

## 2020-02-03 DIAGNOSIS — I252 Old myocardial infarction: Secondary | ICD-10-CM | POA: Diagnosis not present

## 2020-02-03 DIAGNOSIS — R634 Abnormal weight loss: Secondary | ICD-10-CM | POA: Diagnosis not present

## 2020-02-03 DIAGNOSIS — I10 Essential (primary) hypertension: Secondary | ICD-10-CM | POA: Diagnosis not present

## 2020-02-03 DIAGNOSIS — I25119 Atherosclerotic heart disease of native coronary artery with unspecified angina pectoris: Secondary | ICD-10-CM | POA: Diagnosis not present

## 2020-02-03 DIAGNOSIS — G620 Drug-induced polyneuropathy: Secondary | ICD-10-CM | POA: Diagnosis not present

## 2020-02-03 DIAGNOSIS — E785 Hyperlipidemia, unspecified: Secondary | ICD-10-CM | POA: Diagnosis not present

## 2020-02-03 DIAGNOSIS — C7951 Secondary malignant neoplasm of bone: Secondary | ICD-10-CM | POA: Diagnosis not present

## 2020-02-03 DIAGNOSIS — C61 Malignant neoplasm of prostate: Secondary | ICD-10-CM | POA: Diagnosis not present

## 2020-02-03 DIAGNOSIS — R630 Anorexia: Secondary | ICD-10-CM | POA: Diagnosis not present

## 2020-02-03 MED ORDER — LORAZEPAM 0.5 MG PO TABS: 0.5000 mg | ORAL_TABLET | ORAL | 0 refills | Status: DC | PRN

## 2020-02-03 NOTE — Progress Notes (Signed)
I spoke with patient's hospice nurse. She requested an Rx for lorazepam. Patient has been using the lorazepam from the comfort kit with good effect. Will send Rx.

## 2020-02-04 DIAGNOSIS — C61 Malignant neoplasm of prostate: Secondary | ICD-10-CM | POA: Diagnosis not present

## 2020-02-04 DIAGNOSIS — E785 Hyperlipidemia, unspecified: Secondary | ICD-10-CM | POA: Diagnosis not present

## 2020-02-04 DIAGNOSIS — R630 Anorexia: Secondary | ICD-10-CM | POA: Diagnosis not present

## 2020-02-04 DIAGNOSIS — I252 Old myocardial infarction: Secondary | ICD-10-CM | POA: Diagnosis not present

## 2020-02-04 DIAGNOSIS — G620 Drug-induced polyneuropathy: Secondary | ICD-10-CM | POA: Diagnosis not present

## 2020-02-04 DIAGNOSIS — I25119 Atherosclerotic heart disease of native coronary artery with unspecified angina pectoris: Secondary | ICD-10-CM | POA: Diagnosis not present

## 2020-02-04 DIAGNOSIS — I10 Essential (primary) hypertension: Secondary | ICD-10-CM | POA: Diagnosis not present

## 2020-02-04 DIAGNOSIS — R634 Abnormal weight loss: Secondary | ICD-10-CM | POA: Diagnosis not present

## 2020-02-04 DIAGNOSIS — C7951 Secondary malignant neoplasm of bone: Secondary | ICD-10-CM | POA: Diagnosis not present

## 2020-02-05 DIAGNOSIS — C7951 Secondary malignant neoplasm of bone: Secondary | ICD-10-CM | POA: Diagnosis not present

## 2020-02-05 DIAGNOSIS — R634 Abnormal weight loss: Secondary | ICD-10-CM | POA: Diagnosis not present

## 2020-02-05 DIAGNOSIS — I25119 Atherosclerotic heart disease of native coronary artery with unspecified angina pectoris: Secondary | ICD-10-CM | POA: Diagnosis not present

## 2020-02-05 DIAGNOSIS — R630 Anorexia: Secondary | ICD-10-CM | POA: Diagnosis not present

## 2020-02-05 DIAGNOSIS — G620 Drug-induced polyneuropathy: Secondary | ICD-10-CM | POA: Diagnosis not present

## 2020-02-05 DIAGNOSIS — I252 Old myocardial infarction: Secondary | ICD-10-CM | POA: Diagnosis not present

## 2020-02-05 DIAGNOSIS — E785 Hyperlipidemia, unspecified: Secondary | ICD-10-CM | POA: Diagnosis not present

## 2020-02-05 DIAGNOSIS — I10 Essential (primary) hypertension: Secondary | ICD-10-CM | POA: Diagnosis not present

## 2020-02-05 DIAGNOSIS — C61 Malignant neoplasm of prostate: Secondary | ICD-10-CM | POA: Diagnosis not present

## 2020-02-06 ENCOUNTER — Telehealth: Payer: Self-pay

## 2020-02-06 DIAGNOSIS — I10 Essential (primary) hypertension: Secondary | ICD-10-CM | POA: Diagnosis not present

## 2020-02-06 DIAGNOSIS — G620 Drug-induced polyneuropathy: Secondary | ICD-10-CM | POA: Diagnosis not present

## 2020-02-06 DIAGNOSIS — I25119 Atherosclerotic heart disease of native coronary artery with unspecified angina pectoris: Secondary | ICD-10-CM | POA: Diagnosis not present

## 2020-02-06 DIAGNOSIS — C7951 Secondary malignant neoplasm of bone: Secondary | ICD-10-CM | POA: Diagnosis not present

## 2020-02-06 DIAGNOSIS — R630 Anorexia: Secondary | ICD-10-CM | POA: Diagnosis not present

## 2020-02-06 DIAGNOSIS — C61 Malignant neoplasm of prostate: Secondary | ICD-10-CM | POA: Diagnosis not present

## 2020-02-06 DIAGNOSIS — E785 Hyperlipidemia, unspecified: Secondary | ICD-10-CM | POA: Diagnosis not present

## 2020-02-06 DIAGNOSIS — R634 Abnormal weight loss: Secondary | ICD-10-CM | POA: Diagnosis not present

## 2020-02-06 DIAGNOSIS — I252 Old myocardial infarction: Secondary | ICD-10-CM | POA: Diagnosis not present

## 2020-02-06 NOTE — Telephone Encounter (Signed)
FMLA for wife has been completed. Wife is aware. Made copy and put downstairs for pick up.

## 2020-02-07 DIAGNOSIS — I252 Old myocardial infarction: Secondary | ICD-10-CM | POA: Diagnosis not present

## 2020-02-07 DIAGNOSIS — I10 Essential (primary) hypertension: Secondary | ICD-10-CM | POA: Diagnosis not present

## 2020-02-07 DIAGNOSIS — C61 Malignant neoplasm of prostate: Secondary | ICD-10-CM | POA: Diagnosis not present

## 2020-02-07 DIAGNOSIS — R634 Abnormal weight loss: Secondary | ICD-10-CM | POA: Diagnosis not present

## 2020-02-07 DIAGNOSIS — C7951 Secondary malignant neoplasm of bone: Secondary | ICD-10-CM | POA: Diagnosis not present

## 2020-02-07 DIAGNOSIS — R630 Anorexia: Secondary | ICD-10-CM | POA: Diagnosis not present

## 2020-02-07 DIAGNOSIS — I25119 Atherosclerotic heart disease of native coronary artery with unspecified angina pectoris: Secondary | ICD-10-CM | POA: Diagnosis not present

## 2020-02-07 DIAGNOSIS — G620 Drug-induced polyneuropathy: Secondary | ICD-10-CM | POA: Diagnosis not present

## 2020-02-07 DIAGNOSIS — E785 Hyperlipidemia, unspecified: Secondary | ICD-10-CM | POA: Diagnosis not present

## 2020-02-08 DIAGNOSIS — I10 Essential (primary) hypertension: Secondary | ICD-10-CM | POA: Diagnosis not present

## 2020-02-08 DIAGNOSIS — C61 Malignant neoplasm of prostate: Secondary | ICD-10-CM | POA: Diagnosis not present

## 2020-02-08 DIAGNOSIS — I252 Old myocardial infarction: Secondary | ICD-10-CM | POA: Diagnosis not present

## 2020-02-08 DIAGNOSIS — E785 Hyperlipidemia, unspecified: Secondary | ICD-10-CM | POA: Diagnosis not present

## 2020-02-08 DIAGNOSIS — I25119 Atherosclerotic heart disease of native coronary artery with unspecified angina pectoris: Secondary | ICD-10-CM | POA: Diagnosis not present

## 2020-02-08 DIAGNOSIS — R630 Anorexia: Secondary | ICD-10-CM | POA: Diagnosis not present

## 2020-02-08 DIAGNOSIS — G620 Drug-induced polyneuropathy: Secondary | ICD-10-CM | POA: Diagnosis not present

## 2020-02-08 DIAGNOSIS — R634 Abnormal weight loss: Secondary | ICD-10-CM | POA: Diagnosis not present

## 2020-02-08 DIAGNOSIS — C7951 Secondary malignant neoplasm of bone: Secondary | ICD-10-CM | POA: Diagnosis not present

## 2020-02-09 DIAGNOSIS — C61 Malignant neoplasm of prostate: Secondary | ICD-10-CM | POA: Diagnosis not present

## 2020-02-09 DIAGNOSIS — E785 Hyperlipidemia, unspecified: Secondary | ICD-10-CM | POA: Diagnosis not present

## 2020-02-09 DIAGNOSIS — C7951 Secondary malignant neoplasm of bone: Secondary | ICD-10-CM | POA: Diagnosis not present

## 2020-02-09 DIAGNOSIS — I252 Old myocardial infarction: Secondary | ICD-10-CM | POA: Diagnosis not present

## 2020-02-09 DIAGNOSIS — I25119 Atherosclerotic heart disease of native coronary artery with unspecified angina pectoris: Secondary | ICD-10-CM | POA: Diagnosis not present

## 2020-02-09 DIAGNOSIS — R634 Abnormal weight loss: Secondary | ICD-10-CM | POA: Diagnosis not present

## 2020-02-09 DIAGNOSIS — R630 Anorexia: Secondary | ICD-10-CM | POA: Diagnosis not present

## 2020-02-09 DIAGNOSIS — G620 Drug-induced polyneuropathy: Secondary | ICD-10-CM | POA: Diagnosis not present

## 2020-02-09 DIAGNOSIS — I10 Essential (primary) hypertension: Secondary | ICD-10-CM | POA: Diagnosis not present

## 2020-02-10 ENCOUNTER — Other Ambulatory Visit: Payer: Self-pay | Admitting: Hospice and Palliative Medicine

## 2020-02-10 DIAGNOSIS — E785 Hyperlipidemia, unspecified: Secondary | ICD-10-CM | POA: Diagnosis not present

## 2020-02-10 DIAGNOSIS — G620 Drug-induced polyneuropathy: Secondary | ICD-10-CM | POA: Diagnosis not present

## 2020-02-10 DIAGNOSIS — I252 Old myocardial infarction: Secondary | ICD-10-CM | POA: Diagnosis not present

## 2020-02-10 DIAGNOSIS — R634 Abnormal weight loss: Secondary | ICD-10-CM | POA: Diagnosis not present

## 2020-02-10 DIAGNOSIS — C7951 Secondary malignant neoplasm of bone: Secondary | ICD-10-CM | POA: Diagnosis not present

## 2020-02-10 DIAGNOSIS — R630 Anorexia: Secondary | ICD-10-CM | POA: Diagnosis not present

## 2020-02-10 DIAGNOSIS — C61 Malignant neoplasm of prostate: Secondary | ICD-10-CM | POA: Diagnosis not present

## 2020-02-10 DIAGNOSIS — I10 Essential (primary) hypertension: Secondary | ICD-10-CM | POA: Diagnosis not present

## 2020-02-10 DIAGNOSIS — I25119 Atherosclerotic heart disease of native coronary artery with unspecified angina pectoris: Secondary | ICD-10-CM | POA: Diagnosis not present

## 2020-02-10 MED ORDER — OXYCODONE HCL 15 MG PO TABS: 15.0000 mg | ORAL_TABLET | Freq: Four times a day (QID) | ORAL | 0 refills | Status: DC | PRN

## 2020-02-10 NOTE — Progress Notes (Signed)
I spoke with patient's hospice nurse who requested refill of oxycodone. Will send to pharmacy. She says that patient is doing better after starting lorazepam for anxiety. He is more functional at home and pain appears to be stable on current regimen.   PDMP reviewed.

## 2020-02-11 DIAGNOSIS — R634 Abnormal weight loss: Secondary | ICD-10-CM | POA: Diagnosis not present

## 2020-02-11 DIAGNOSIS — G620 Drug-induced polyneuropathy: Secondary | ICD-10-CM | POA: Diagnosis not present

## 2020-02-11 DIAGNOSIS — I252 Old myocardial infarction: Secondary | ICD-10-CM | POA: Diagnosis not present

## 2020-02-11 DIAGNOSIS — E785 Hyperlipidemia, unspecified: Secondary | ICD-10-CM | POA: Diagnosis not present

## 2020-02-11 DIAGNOSIS — R630 Anorexia: Secondary | ICD-10-CM | POA: Diagnosis not present

## 2020-02-11 DIAGNOSIS — I10 Essential (primary) hypertension: Secondary | ICD-10-CM | POA: Diagnosis not present

## 2020-02-11 DIAGNOSIS — C7951 Secondary malignant neoplasm of bone: Secondary | ICD-10-CM | POA: Diagnosis not present

## 2020-02-11 DIAGNOSIS — I25119 Atherosclerotic heart disease of native coronary artery with unspecified angina pectoris: Secondary | ICD-10-CM | POA: Diagnosis not present

## 2020-02-11 DIAGNOSIS — C61 Malignant neoplasm of prostate: Secondary | ICD-10-CM | POA: Diagnosis not present

## 2020-02-12 DIAGNOSIS — C61 Malignant neoplasm of prostate: Secondary | ICD-10-CM | POA: Diagnosis not present

## 2020-02-12 DIAGNOSIS — G620 Drug-induced polyneuropathy: Secondary | ICD-10-CM | POA: Diagnosis not present

## 2020-02-12 DIAGNOSIS — R630 Anorexia: Secondary | ICD-10-CM | POA: Diagnosis not present

## 2020-02-12 DIAGNOSIS — I10 Essential (primary) hypertension: Secondary | ICD-10-CM | POA: Diagnosis not present

## 2020-02-12 DIAGNOSIS — I252 Old myocardial infarction: Secondary | ICD-10-CM | POA: Diagnosis not present

## 2020-02-12 DIAGNOSIS — I25119 Atherosclerotic heart disease of native coronary artery with unspecified angina pectoris: Secondary | ICD-10-CM | POA: Diagnosis not present

## 2020-02-12 DIAGNOSIS — R634 Abnormal weight loss: Secondary | ICD-10-CM | POA: Diagnosis not present

## 2020-02-12 DIAGNOSIS — C7951 Secondary malignant neoplasm of bone: Secondary | ICD-10-CM | POA: Diagnosis not present

## 2020-02-12 DIAGNOSIS — E785 Hyperlipidemia, unspecified: Secondary | ICD-10-CM | POA: Diagnosis not present

## 2020-02-13 DIAGNOSIS — R630 Anorexia: Secondary | ICD-10-CM | POA: Diagnosis not present

## 2020-02-13 DIAGNOSIS — E785 Hyperlipidemia, unspecified: Secondary | ICD-10-CM | POA: Diagnosis not present

## 2020-02-13 DIAGNOSIS — C7951 Secondary malignant neoplasm of bone: Secondary | ICD-10-CM | POA: Diagnosis not present

## 2020-02-13 DIAGNOSIS — C61 Malignant neoplasm of prostate: Secondary | ICD-10-CM | POA: Diagnosis not present

## 2020-02-13 DIAGNOSIS — I25119 Atherosclerotic heart disease of native coronary artery with unspecified angina pectoris: Secondary | ICD-10-CM | POA: Diagnosis not present

## 2020-02-13 DIAGNOSIS — I10 Essential (primary) hypertension: Secondary | ICD-10-CM | POA: Diagnosis not present

## 2020-02-13 DIAGNOSIS — R634 Abnormal weight loss: Secondary | ICD-10-CM | POA: Diagnosis not present

## 2020-02-13 DIAGNOSIS — G620 Drug-induced polyneuropathy: Secondary | ICD-10-CM | POA: Diagnosis not present

## 2020-02-13 DIAGNOSIS — I252 Old myocardial infarction: Secondary | ICD-10-CM | POA: Diagnosis not present

## 2020-02-14 DIAGNOSIS — I25119 Atherosclerotic heart disease of native coronary artery with unspecified angina pectoris: Secondary | ICD-10-CM | POA: Diagnosis not present

## 2020-02-14 DIAGNOSIS — C61 Malignant neoplasm of prostate: Secondary | ICD-10-CM | POA: Diagnosis not present

## 2020-02-14 DIAGNOSIS — I10 Essential (primary) hypertension: Secondary | ICD-10-CM | POA: Diagnosis not present

## 2020-02-14 DIAGNOSIS — G620 Drug-induced polyneuropathy: Secondary | ICD-10-CM | POA: Diagnosis not present

## 2020-02-14 DIAGNOSIS — E785 Hyperlipidemia, unspecified: Secondary | ICD-10-CM | POA: Diagnosis not present

## 2020-02-14 DIAGNOSIS — R630 Anorexia: Secondary | ICD-10-CM | POA: Diagnosis not present

## 2020-02-14 DIAGNOSIS — R634 Abnormal weight loss: Secondary | ICD-10-CM | POA: Diagnosis not present

## 2020-02-14 DIAGNOSIS — I252 Old myocardial infarction: Secondary | ICD-10-CM | POA: Diagnosis not present

## 2020-02-14 DIAGNOSIS — C7951 Secondary malignant neoplasm of bone: Secondary | ICD-10-CM | POA: Diagnosis not present

## 2020-02-15 DIAGNOSIS — C7951 Secondary malignant neoplasm of bone: Secondary | ICD-10-CM | POA: Diagnosis not present

## 2020-02-15 DIAGNOSIS — G620 Drug-induced polyneuropathy: Secondary | ICD-10-CM | POA: Diagnosis not present

## 2020-02-15 DIAGNOSIS — R630 Anorexia: Secondary | ICD-10-CM | POA: Diagnosis not present

## 2020-02-15 DIAGNOSIS — I25119 Atherosclerotic heart disease of native coronary artery with unspecified angina pectoris: Secondary | ICD-10-CM | POA: Diagnosis not present

## 2020-02-15 DIAGNOSIS — I252 Old myocardial infarction: Secondary | ICD-10-CM | POA: Diagnosis not present

## 2020-02-15 DIAGNOSIS — C61 Malignant neoplasm of prostate: Secondary | ICD-10-CM | POA: Diagnosis not present

## 2020-02-15 DIAGNOSIS — I10 Essential (primary) hypertension: Secondary | ICD-10-CM | POA: Diagnosis not present

## 2020-02-15 DIAGNOSIS — E785 Hyperlipidemia, unspecified: Secondary | ICD-10-CM | POA: Diagnosis not present

## 2020-02-15 DIAGNOSIS — R634 Abnormal weight loss: Secondary | ICD-10-CM | POA: Diagnosis not present

## 2020-02-16 DIAGNOSIS — R634 Abnormal weight loss: Secondary | ICD-10-CM | POA: Diagnosis not present

## 2020-02-16 DIAGNOSIS — I252 Old myocardial infarction: Secondary | ICD-10-CM | POA: Diagnosis not present

## 2020-02-16 DIAGNOSIS — E785 Hyperlipidemia, unspecified: Secondary | ICD-10-CM | POA: Diagnosis not present

## 2020-02-16 DIAGNOSIS — C7951 Secondary malignant neoplasm of bone: Secondary | ICD-10-CM | POA: Diagnosis not present

## 2020-02-16 DIAGNOSIS — I10 Essential (primary) hypertension: Secondary | ICD-10-CM | POA: Diagnosis not present

## 2020-02-16 DIAGNOSIS — R630 Anorexia: Secondary | ICD-10-CM | POA: Diagnosis not present

## 2020-02-16 DIAGNOSIS — I25119 Atherosclerotic heart disease of native coronary artery with unspecified angina pectoris: Secondary | ICD-10-CM | POA: Diagnosis not present

## 2020-02-16 DIAGNOSIS — C61 Malignant neoplasm of prostate: Secondary | ICD-10-CM | POA: Diagnosis not present

## 2020-02-16 DIAGNOSIS — G620 Drug-induced polyneuropathy: Secondary | ICD-10-CM | POA: Diagnosis not present

## 2020-02-17 ENCOUNTER — Other Ambulatory Visit: Payer: Self-pay | Admitting: *Deleted

## 2020-02-17 ENCOUNTER — Other Ambulatory Visit: Payer: Self-pay | Admitting: Hospice and Palliative Medicine

## 2020-02-17 ENCOUNTER — Encounter: Payer: Self-pay | Admitting: Internal Medicine

## 2020-02-17 DIAGNOSIS — E785 Hyperlipidemia, unspecified: Secondary | ICD-10-CM | POA: Diagnosis not present

## 2020-02-17 DIAGNOSIS — R630 Anorexia: Secondary | ICD-10-CM | POA: Diagnosis not present

## 2020-02-17 DIAGNOSIS — G893 Neoplasm related pain (acute) (chronic): Secondary | ICD-10-CM

## 2020-02-17 DIAGNOSIS — I25119 Atherosclerotic heart disease of native coronary artery with unspecified angina pectoris: Secondary | ICD-10-CM | POA: Diagnosis not present

## 2020-02-17 DIAGNOSIS — I252 Old myocardial infarction: Secondary | ICD-10-CM | POA: Diagnosis not present

## 2020-02-17 DIAGNOSIS — C7951 Secondary malignant neoplasm of bone: Secondary | ICD-10-CM | POA: Diagnosis not present

## 2020-02-17 DIAGNOSIS — R634 Abnormal weight loss: Secondary | ICD-10-CM | POA: Diagnosis not present

## 2020-02-17 DIAGNOSIS — I10 Essential (primary) hypertension: Secondary | ICD-10-CM | POA: Diagnosis not present

## 2020-02-17 DIAGNOSIS — G620 Drug-induced polyneuropathy: Secondary | ICD-10-CM | POA: Diagnosis not present

## 2020-02-17 DIAGNOSIS — C61 Malignant neoplasm of prostate: Secondary | ICD-10-CM | POA: Diagnosis not present

## 2020-02-17 MED ORDER — FENTANYL 100 MCG/HR TD PT72: 1.0000 | MEDICATED_PATCH | TRANSDERMAL | 0 refills | Status: DC

## 2020-02-17 MED ORDER — LORAZEPAM 0.5 MG PO TABS: 0.5000 mg | ORAL_TABLET | ORAL | 0 refills | Status: DC | PRN

## 2020-02-17 MED ORDER — FENTANYL 50 MCG/HR TD PT72: 1.0000 | MEDICATED_PATCH | TRANSDERMAL | 0 refills | Status: DC

## 2020-02-18 DIAGNOSIS — R630 Anorexia: Secondary | ICD-10-CM | POA: Diagnosis not present

## 2020-02-18 DIAGNOSIS — C7951 Secondary malignant neoplasm of bone: Secondary | ICD-10-CM | POA: Diagnosis not present

## 2020-02-18 DIAGNOSIS — R634 Abnormal weight loss: Secondary | ICD-10-CM | POA: Diagnosis not present

## 2020-02-18 DIAGNOSIS — I25119 Atherosclerotic heart disease of native coronary artery with unspecified angina pectoris: Secondary | ICD-10-CM | POA: Diagnosis not present

## 2020-02-18 DIAGNOSIS — I10 Essential (primary) hypertension: Secondary | ICD-10-CM | POA: Diagnosis not present

## 2020-02-18 DIAGNOSIS — E785 Hyperlipidemia, unspecified: Secondary | ICD-10-CM | POA: Diagnosis not present

## 2020-02-18 DIAGNOSIS — I252 Old myocardial infarction: Secondary | ICD-10-CM | POA: Diagnosis not present

## 2020-02-18 DIAGNOSIS — G620 Drug-induced polyneuropathy: Secondary | ICD-10-CM | POA: Diagnosis not present

## 2020-02-18 DIAGNOSIS — C61 Malignant neoplasm of prostate: Secondary | ICD-10-CM | POA: Diagnosis not present

## 2020-02-19 DIAGNOSIS — R634 Abnormal weight loss: Secondary | ICD-10-CM | POA: Diagnosis not present

## 2020-02-19 DIAGNOSIS — I25119 Atherosclerotic heart disease of native coronary artery with unspecified angina pectoris: Secondary | ICD-10-CM | POA: Diagnosis not present

## 2020-02-19 DIAGNOSIS — I10 Essential (primary) hypertension: Secondary | ICD-10-CM | POA: Diagnosis not present

## 2020-02-19 DIAGNOSIS — C7951 Secondary malignant neoplasm of bone: Secondary | ICD-10-CM | POA: Diagnosis not present

## 2020-02-19 DIAGNOSIS — R630 Anorexia: Secondary | ICD-10-CM | POA: Diagnosis not present

## 2020-02-19 DIAGNOSIS — G620 Drug-induced polyneuropathy: Secondary | ICD-10-CM | POA: Diagnosis not present

## 2020-02-19 DIAGNOSIS — C61 Malignant neoplasm of prostate: Secondary | ICD-10-CM | POA: Diagnosis not present

## 2020-02-19 DIAGNOSIS — E785 Hyperlipidemia, unspecified: Secondary | ICD-10-CM | POA: Diagnosis not present

## 2020-02-19 DIAGNOSIS — I252 Old myocardial infarction: Secondary | ICD-10-CM | POA: Diagnosis not present

## 2020-02-20 DIAGNOSIS — C7951 Secondary malignant neoplasm of bone: Secondary | ICD-10-CM | POA: Diagnosis not present

## 2020-02-20 DIAGNOSIS — R634 Abnormal weight loss: Secondary | ICD-10-CM | POA: Diagnosis not present

## 2020-02-20 DIAGNOSIS — I252 Old myocardial infarction: Secondary | ICD-10-CM | POA: Diagnosis not present

## 2020-02-20 DIAGNOSIS — G620 Drug-induced polyneuropathy: Secondary | ICD-10-CM | POA: Diagnosis not present

## 2020-02-20 DIAGNOSIS — I10 Essential (primary) hypertension: Secondary | ICD-10-CM | POA: Diagnosis not present

## 2020-02-20 DIAGNOSIS — I25119 Atherosclerotic heart disease of native coronary artery with unspecified angina pectoris: Secondary | ICD-10-CM | POA: Diagnosis not present

## 2020-02-20 DIAGNOSIS — R630 Anorexia: Secondary | ICD-10-CM | POA: Diagnosis not present

## 2020-02-20 DIAGNOSIS — C61 Malignant neoplasm of prostate: Secondary | ICD-10-CM | POA: Diagnosis not present

## 2020-02-20 DIAGNOSIS — E785 Hyperlipidemia, unspecified: Secondary | ICD-10-CM | POA: Diagnosis not present

## 2020-02-21 DIAGNOSIS — I25119 Atherosclerotic heart disease of native coronary artery with unspecified angina pectoris: Secondary | ICD-10-CM | POA: Diagnosis not present

## 2020-02-21 DIAGNOSIS — R634 Abnormal weight loss: Secondary | ICD-10-CM | POA: Diagnosis not present

## 2020-02-21 DIAGNOSIS — E785 Hyperlipidemia, unspecified: Secondary | ICD-10-CM | POA: Diagnosis not present

## 2020-02-21 DIAGNOSIS — C7951 Secondary malignant neoplasm of bone: Secondary | ICD-10-CM | POA: Diagnosis not present

## 2020-02-21 DIAGNOSIS — G620 Drug-induced polyneuropathy: Secondary | ICD-10-CM | POA: Diagnosis not present

## 2020-02-21 DIAGNOSIS — R630 Anorexia: Secondary | ICD-10-CM | POA: Diagnosis not present

## 2020-02-21 DIAGNOSIS — I10 Essential (primary) hypertension: Secondary | ICD-10-CM | POA: Diagnosis not present

## 2020-02-21 DIAGNOSIS — I252 Old myocardial infarction: Secondary | ICD-10-CM | POA: Diagnosis not present

## 2020-02-21 DIAGNOSIS — C61 Malignant neoplasm of prostate: Secondary | ICD-10-CM | POA: Diagnosis not present

## 2020-02-22 DIAGNOSIS — I252 Old myocardial infarction: Secondary | ICD-10-CM | POA: Diagnosis not present

## 2020-02-22 DIAGNOSIS — R634 Abnormal weight loss: Secondary | ICD-10-CM | POA: Diagnosis not present

## 2020-02-22 DIAGNOSIS — G620 Drug-induced polyneuropathy: Secondary | ICD-10-CM | POA: Diagnosis not present

## 2020-02-22 DIAGNOSIS — R630 Anorexia: Secondary | ICD-10-CM | POA: Diagnosis not present

## 2020-02-22 DIAGNOSIS — E785 Hyperlipidemia, unspecified: Secondary | ICD-10-CM | POA: Diagnosis not present

## 2020-02-22 DIAGNOSIS — I10 Essential (primary) hypertension: Secondary | ICD-10-CM | POA: Diagnosis not present

## 2020-02-22 DIAGNOSIS — C61 Malignant neoplasm of prostate: Secondary | ICD-10-CM | POA: Diagnosis not present

## 2020-02-22 DIAGNOSIS — C7951 Secondary malignant neoplasm of bone: Secondary | ICD-10-CM | POA: Diagnosis not present

## 2020-02-22 DIAGNOSIS — I25119 Atherosclerotic heart disease of native coronary artery with unspecified angina pectoris: Secondary | ICD-10-CM | POA: Diagnosis not present

## 2020-02-23 DIAGNOSIS — G620 Drug-induced polyneuropathy: Secondary | ICD-10-CM | POA: Diagnosis not present

## 2020-02-23 DIAGNOSIS — C61 Malignant neoplasm of prostate: Secondary | ICD-10-CM | POA: Diagnosis not present

## 2020-02-23 DIAGNOSIS — R630 Anorexia: Secondary | ICD-10-CM | POA: Diagnosis not present

## 2020-02-23 DIAGNOSIS — C7951 Secondary malignant neoplasm of bone: Secondary | ICD-10-CM | POA: Diagnosis not present

## 2020-02-23 DIAGNOSIS — R634 Abnormal weight loss: Secondary | ICD-10-CM | POA: Diagnosis not present

## 2020-02-23 DIAGNOSIS — E785 Hyperlipidemia, unspecified: Secondary | ICD-10-CM | POA: Diagnosis not present

## 2020-02-23 DIAGNOSIS — I252 Old myocardial infarction: Secondary | ICD-10-CM | POA: Diagnosis not present

## 2020-02-23 DIAGNOSIS — I10 Essential (primary) hypertension: Secondary | ICD-10-CM | POA: Diagnosis not present

## 2020-02-23 DIAGNOSIS — I25119 Atherosclerotic heart disease of native coronary artery with unspecified angina pectoris: Secondary | ICD-10-CM | POA: Diagnosis not present

## 2020-02-24 DIAGNOSIS — E785 Hyperlipidemia, unspecified: Secondary | ICD-10-CM | POA: Diagnosis not present

## 2020-02-24 DIAGNOSIS — R634 Abnormal weight loss: Secondary | ICD-10-CM | POA: Diagnosis not present

## 2020-02-24 DIAGNOSIS — C7951 Secondary malignant neoplasm of bone: Secondary | ICD-10-CM | POA: Diagnosis not present

## 2020-02-24 DIAGNOSIS — I10 Essential (primary) hypertension: Secondary | ICD-10-CM | POA: Diagnosis not present

## 2020-02-24 DIAGNOSIS — C61 Malignant neoplasm of prostate: Secondary | ICD-10-CM | POA: Diagnosis not present

## 2020-02-24 DIAGNOSIS — R630 Anorexia: Secondary | ICD-10-CM | POA: Diagnosis not present

## 2020-02-24 DIAGNOSIS — I25119 Atherosclerotic heart disease of native coronary artery with unspecified angina pectoris: Secondary | ICD-10-CM | POA: Diagnosis not present

## 2020-02-24 DIAGNOSIS — I252 Old myocardial infarction: Secondary | ICD-10-CM | POA: Diagnosis not present

## 2020-02-24 DIAGNOSIS — G620 Drug-induced polyneuropathy: Secondary | ICD-10-CM | POA: Diagnosis not present

## 2020-02-25 DIAGNOSIS — G620 Drug-induced polyneuropathy: Secondary | ICD-10-CM | POA: Diagnosis not present

## 2020-02-25 DIAGNOSIS — I25119 Atherosclerotic heart disease of native coronary artery with unspecified angina pectoris: Secondary | ICD-10-CM | POA: Diagnosis not present

## 2020-02-25 DIAGNOSIS — I10 Essential (primary) hypertension: Secondary | ICD-10-CM | POA: Diagnosis not present

## 2020-02-25 DIAGNOSIS — I252 Old myocardial infarction: Secondary | ICD-10-CM | POA: Diagnosis not present

## 2020-02-25 DIAGNOSIS — C61 Malignant neoplasm of prostate: Secondary | ICD-10-CM | POA: Diagnosis not present

## 2020-02-25 DIAGNOSIS — C7951 Secondary malignant neoplasm of bone: Secondary | ICD-10-CM | POA: Diagnosis not present

## 2020-02-25 DIAGNOSIS — R630 Anorexia: Secondary | ICD-10-CM | POA: Diagnosis not present

## 2020-02-25 DIAGNOSIS — R634 Abnormal weight loss: Secondary | ICD-10-CM | POA: Diagnosis not present

## 2020-02-25 DIAGNOSIS — E785 Hyperlipidemia, unspecified: Secondary | ICD-10-CM | POA: Diagnosis not present

## 2020-02-26 DIAGNOSIS — C61 Malignant neoplasm of prostate: Secondary | ICD-10-CM | POA: Diagnosis not present

## 2020-02-26 DIAGNOSIS — C7951 Secondary malignant neoplasm of bone: Secondary | ICD-10-CM | POA: Diagnosis not present

## 2020-02-26 DIAGNOSIS — I252 Old myocardial infarction: Secondary | ICD-10-CM | POA: Diagnosis not present

## 2020-02-26 DIAGNOSIS — R630 Anorexia: Secondary | ICD-10-CM | POA: Diagnosis not present

## 2020-02-26 DIAGNOSIS — E785 Hyperlipidemia, unspecified: Secondary | ICD-10-CM | POA: Diagnosis not present

## 2020-02-26 DIAGNOSIS — I10 Essential (primary) hypertension: Secondary | ICD-10-CM | POA: Diagnosis not present

## 2020-02-26 DIAGNOSIS — I25119 Atherosclerotic heart disease of native coronary artery with unspecified angina pectoris: Secondary | ICD-10-CM | POA: Diagnosis not present

## 2020-02-26 DIAGNOSIS — G620 Drug-induced polyneuropathy: Secondary | ICD-10-CM | POA: Diagnosis not present

## 2020-02-26 DIAGNOSIS — R634 Abnormal weight loss: Secondary | ICD-10-CM | POA: Diagnosis not present

## 2020-02-27 DIAGNOSIS — C7951 Secondary malignant neoplasm of bone: Secondary | ICD-10-CM | POA: Diagnosis not present

## 2020-02-27 DIAGNOSIS — I10 Essential (primary) hypertension: Secondary | ICD-10-CM | POA: Diagnosis not present

## 2020-02-27 DIAGNOSIS — G620 Drug-induced polyneuropathy: Secondary | ICD-10-CM | POA: Diagnosis not present

## 2020-02-27 DIAGNOSIS — I25119 Atherosclerotic heart disease of native coronary artery with unspecified angina pectoris: Secondary | ICD-10-CM | POA: Diagnosis not present

## 2020-02-27 DIAGNOSIS — R630 Anorexia: Secondary | ICD-10-CM | POA: Diagnosis not present

## 2020-02-27 DIAGNOSIS — I252 Old myocardial infarction: Secondary | ICD-10-CM | POA: Diagnosis not present

## 2020-02-27 DIAGNOSIS — E785 Hyperlipidemia, unspecified: Secondary | ICD-10-CM | POA: Diagnosis not present

## 2020-02-27 DIAGNOSIS — C61 Malignant neoplasm of prostate: Secondary | ICD-10-CM | POA: Diagnosis not present

## 2020-02-27 DIAGNOSIS — R634 Abnormal weight loss: Secondary | ICD-10-CM | POA: Diagnosis not present

## 2020-02-28 DIAGNOSIS — C7951 Secondary malignant neoplasm of bone: Secondary | ICD-10-CM | POA: Diagnosis not present

## 2020-02-28 DIAGNOSIS — G620 Drug-induced polyneuropathy: Secondary | ICD-10-CM | POA: Diagnosis not present

## 2020-02-28 DIAGNOSIS — I252 Old myocardial infarction: Secondary | ICD-10-CM | POA: Diagnosis not present

## 2020-02-28 DIAGNOSIS — R630 Anorexia: Secondary | ICD-10-CM | POA: Diagnosis not present

## 2020-02-28 DIAGNOSIS — I10 Essential (primary) hypertension: Secondary | ICD-10-CM | POA: Diagnosis not present

## 2020-02-28 DIAGNOSIS — C61 Malignant neoplasm of prostate: Secondary | ICD-10-CM | POA: Diagnosis not present

## 2020-02-28 DIAGNOSIS — I25119 Atherosclerotic heart disease of native coronary artery with unspecified angina pectoris: Secondary | ICD-10-CM | POA: Diagnosis not present

## 2020-02-28 DIAGNOSIS — E785 Hyperlipidemia, unspecified: Secondary | ICD-10-CM | POA: Diagnosis not present

## 2020-02-28 DIAGNOSIS — R634 Abnormal weight loss: Secondary | ICD-10-CM | POA: Diagnosis not present

## 2020-02-29 DIAGNOSIS — I25119 Atherosclerotic heart disease of native coronary artery with unspecified angina pectoris: Secondary | ICD-10-CM | POA: Diagnosis not present

## 2020-02-29 DIAGNOSIS — C7951 Secondary malignant neoplasm of bone: Secondary | ICD-10-CM | POA: Diagnosis not present

## 2020-02-29 DIAGNOSIS — C61 Malignant neoplasm of prostate: Secondary | ICD-10-CM | POA: Diagnosis not present

## 2020-02-29 DIAGNOSIS — R630 Anorexia: Secondary | ICD-10-CM | POA: Diagnosis not present

## 2020-02-29 DIAGNOSIS — I10 Essential (primary) hypertension: Secondary | ICD-10-CM | POA: Diagnosis not present

## 2020-02-29 DIAGNOSIS — E785 Hyperlipidemia, unspecified: Secondary | ICD-10-CM | POA: Diagnosis not present

## 2020-02-29 DIAGNOSIS — I252 Old myocardial infarction: Secondary | ICD-10-CM | POA: Diagnosis not present

## 2020-02-29 DIAGNOSIS — G620 Drug-induced polyneuropathy: Secondary | ICD-10-CM | POA: Diagnosis not present

## 2020-02-29 DIAGNOSIS — R634 Abnormal weight loss: Secondary | ICD-10-CM | POA: Diagnosis not present

## 2020-03-01 DIAGNOSIS — C61 Malignant neoplasm of prostate: Secondary | ICD-10-CM | POA: Diagnosis not present

## 2020-03-01 DIAGNOSIS — I25119 Atherosclerotic heart disease of native coronary artery with unspecified angina pectoris: Secondary | ICD-10-CM | POA: Diagnosis not present

## 2020-03-01 DIAGNOSIS — I252 Old myocardial infarction: Secondary | ICD-10-CM | POA: Diagnosis not present

## 2020-03-01 DIAGNOSIS — E785 Hyperlipidemia, unspecified: Secondary | ICD-10-CM | POA: Diagnosis not present

## 2020-03-01 DIAGNOSIS — R634 Abnormal weight loss: Secondary | ICD-10-CM | POA: Diagnosis not present

## 2020-03-01 DIAGNOSIS — I10 Essential (primary) hypertension: Secondary | ICD-10-CM | POA: Diagnosis not present

## 2020-03-01 DIAGNOSIS — C7951 Secondary malignant neoplasm of bone: Secondary | ICD-10-CM | POA: Diagnosis not present

## 2020-03-01 DIAGNOSIS — R630 Anorexia: Secondary | ICD-10-CM | POA: Diagnosis not present

## 2020-03-01 DIAGNOSIS — G620 Drug-induced polyneuropathy: Secondary | ICD-10-CM | POA: Diagnosis not present

## 2020-03-02 ENCOUNTER — Other Ambulatory Visit: Payer: Self-pay | Admitting: Hospice and Palliative Medicine

## 2020-03-02 ENCOUNTER — Telehealth: Payer: Self-pay | Admitting: Hospice and Palliative Medicine

## 2020-03-02 DIAGNOSIS — G620 Drug-induced polyneuropathy: Secondary | ICD-10-CM | POA: Diagnosis not present

## 2020-03-02 DIAGNOSIS — R630 Anorexia: Secondary | ICD-10-CM | POA: Diagnosis not present

## 2020-03-02 DIAGNOSIS — I10 Essential (primary) hypertension: Secondary | ICD-10-CM | POA: Diagnosis not present

## 2020-03-02 DIAGNOSIS — E785 Hyperlipidemia, unspecified: Secondary | ICD-10-CM | POA: Diagnosis not present

## 2020-03-02 DIAGNOSIS — C7951 Secondary malignant neoplasm of bone: Secondary | ICD-10-CM | POA: Diagnosis not present

## 2020-03-02 DIAGNOSIS — R634 Abnormal weight loss: Secondary | ICD-10-CM | POA: Diagnosis not present

## 2020-03-02 DIAGNOSIS — I25119 Atherosclerotic heart disease of native coronary artery with unspecified angina pectoris: Secondary | ICD-10-CM | POA: Diagnosis not present

## 2020-03-02 DIAGNOSIS — C61 Malignant neoplasm of prostate: Secondary | ICD-10-CM | POA: Diagnosis not present

## 2020-03-02 DIAGNOSIS — I252 Old myocardial infarction: Secondary | ICD-10-CM | POA: Diagnosis not present

## 2020-03-02 MED ORDER — MORPHINE SULFATE (CONCENTRATE) 10 MG /0.5 ML PO SOLN: 5.0000 mg | ORAL | 0 refills | Status: DC | PRN

## 2020-03-02 NOTE — Telephone Encounter (Signed)
I received a call from patient's hospice nurse, Anderson Malta.  Patient fell yesterday resulting in skin tears and bruising but no other injury.  He has had worse generalized pain today.  Patient was taking morphine from the comfort kit and found that to be particularly helpful at alleviating the pain.  Per nurse, patient was taking 5 mg (0.19m) every 4-5 hours.  However, he was taking this in addition to oxycodone.  I would not recommend patient being on two short acting opioids.  Nurse says patient would prefer the morphine.  Will refill morphine today.  We also discussed option of rotating from transdermal fentanyl to MS Contin.  As patient has lost subcutaneous tissue with progressive weight loss, it is possible that fentanyl has lost efficacy over time.  Plan: Morphine elixir 5 to 10 mg every 2 hours as needed for breakthrough pain, #38m

## 2020-03-03 DIAGNOSIS — I252 Old myocardial infarction: Secondary | ICD-10-CM | POA: Diagnosis not present

## 2020-03-03 DIAGNOSIS — C61 Malignant neoplasm of prostate: Secondary | ICD-10-CM | POA: Diagnosis not present

## 2020-03-03 DIAGNOSIS — R630 Anorexia: Secondary | ICD-10-CM | POA: Diagnosis not present

## 2020-03-03 DIAGNOSIS — E785 Hyperlipidemia, unspecified: Secondary | ICD-10-CM | POA: Diagnosis not present

## 2020-03-03 DIAGNOSIS — G620 Drug-induced polyneuropathy: Secondary | ICD-10-CM | POA: Diagnosis not present

## 2020-03-03 DIAGNOSIS — I10 Essential (primary) hypertension: Secondary | ICD-10-CM | POA: Diagnosis not present

## 2020-03-03 DIAGNOSIS — R634 Abnormal weight loss: Secondary | ICD-10-CM | POA: Diagnosis not present

## 2020-03-03 DIAGNOSIS — C7951 Secondary malignant neoplasm of bone: Secondary | ICD-10-CM | POA: Diagnosis not present

## 2020-03-03 DIAGNOSIS — I25119 Atherosclerotic heart disease of native coronary artery with unspecified angina pectoris: Secondary | ICD-10-CM | POA: Diagnosis not present

## 2020-03-04 DIAGNOSIS — R630 Anorexia: Secondary | ICD-10-CM | POA: Diagnosis not present

## 2020-03-04 DIAGNOSIS — E785 Hyperlipidemia, unspecified: Secondary | ICD-10-CM | POA: Diagnosis not present

## 2020-03-04 DIAGNOSIS — I252 Old myocardial infarction: Secondary | ICD-10-CM | POA: Diagnosis not present

## 2020-03-04 DIAGNOSIS — I10 Essential (primary) hypertension: Secondary | ICD-10-CM | POA: Diagnosis not present

## 2020-03-04 DIAGNOSIS — C61 Malignant neoplasm of prostate: Secondary | ICD-10-CM | POA: Diagnosis not present

## 2020-03-04 DIAGNOSIS — R634 Abnormal weight loss: Secondary | ICD-10-CM | POA: Diagnosis not present

## 2020-03-04 DIAGNOSIS — I25119 Atherosclerotic heart disease of native coronary artery with unspecified angina pectoris: Secondary | ICD-10-CM | POA: Diagnosis not present

## 2020-03-04 DIAGNOSIS — G620 Drug-induced polyneuropathy: Secondary | ICD-10-CM | POA: Diagnosis not present

## 2020-03-04 DIAGNOSIS — C7951 Secondary malignant neoplasm of bone: Secondary | ICD-10-CM | POA: Diagnosis not present

## 2020-03-05 DIAGNOSIS — G620 Drug-induced polyneuropathy: Secondary | ICD-10-CM | POA: Diagnosis not present

## 2020-03-05 DIAGNOSIS — I252 Old myocardial infarction: Secondary | ICD-10-CM | POA: Diagnosis not present

## 2020-03-05 DIAGNOSIS — R630 Anorexia: Secondary | ICD-10-CM | POA: Diagnosis not present

## 2020-03-05 DIAGNOSIS — C7951 Secondary malignant neoplasm of bone: Secondary | ICD-10-CM | POA: Diagnosis not present

## 2020-03-05 DIAGNOSIS — C61 Malignant neoplasm of prostate: Secondary | ICD-10-CM | POA: Diagnosis not present

## 2020-03-05 DIAGNOSIS — R634 Abnormal weight loss: Secondary | ICD-10-CM | POA: Diagnosis not present

## 2020-03-05 DIAGNOSIS — E785 Hyperlipidemia, unspecified: Secondary | ICD-10-CM | POA: Diagnosis not present

## 2020-03-05 DIAGNOSIS — I25119 Atherosclerotic heart disease of native coronary artery with unspecified angina pectoris: Secondary | ICD-10-CM | POA: Diagnosis not present

## 2020-03-05 DIAGNOSIS — I10 Essential (primary) hypertension: Secondary | ICD-10-CM | POA: Diagnosis not present

## 2020-03-06 DIAGNOSIS — I252 Old myocardial infarction: Secondary | ICD-10-CM | POA: Diagnosis not present

## 2020-03-06 DIAGNOSIS — I10 Essential (primary) hypertension: Secondary | ICD-10-CM | POA: Diagnosis not present

## 2020-03-06 DIAGNOSIS — C61 Malignant neoplasm of prostate: Secondary | ICD-10-CM | POA: Diagnosis not present

## 2020-03-06 DIAGNOSIS — I25119 Atherosclerotic heart disease of native coronary artery with unspecified angina pectoris: Secondary | ICD-10-CM | POA: Diagnosis not present

## 2020-03-06 DIAGNOSIS — E785 Hyperlipidemia, unspecified: Secondary | ICD-10-CM | POA: Diagnosis not present

## 2020-03-06 DIAGNOSIS — R634 Abnormal weight loss: Secondary | ICD-10-CM | POA: Diagnosis not present

## 2020-03-06 DIAGNOSIS — C7951 Secondary malignant neoplasm of bone: Secondary | ICD-10-CM | POA: Diagnosis not present

## 2020-03-06 DIAGNOSIS — R630 Anorexia: Secondary | ICD-10-CM | POA: Diagnosis not present

## 2020-03-06 DIAGNOSIS — G620 Drug-induced polyneuropathy: Secondary | ICD-10-CM | POA: Diagnosis not present

## 2020-03-07 DIAGNOSIS — I252 Old myocardial infarction: Secondary | ICD-10-CM | POA: Diagnosis not present

## 2020-03-07 DIAGNOSIS — C7951 Secondary malignant neoplasm of bone: Secondary | ICD-10-CM | POA: Diagnosis not present

## 2020-03-07 DIAGNOSIS — G620 Drug-induced polyneuropathy: Secondary | ICD-10-CM | POA: Diagnosis not present

## 2020-03-07 DIAGNOSIS — R634 Abnormal weight loss: Secondary | ICD-10-CM | POA: Diagnosis not present

## 2020-03-07 DIAGNOSIS — C61 Malignant neoplasm of prostate: Secondary | ICD-10-CM | POA: Diagnosis not present

## 2020-03-07 DIAGNOSIS — E785 Hyperlipidemia, unspecified: Secondary | ICD-10-CM | POA: Diagnosis not present

## 2020-03-07 DIAGNOSIS — R630 Anorexia: Secondary | ICD-10-CM | POA: Diagnosis not present

## 2020-03-07 DIAGNOSIS — I10 Essential (primary) hypertension: Secondary | ICD-10-CM | POA: Diagnosis not present

## 2020-03-07 DIAGNOSIS — I25119 Atherosclerotic heart disease of native coronary artery with unspecified angina pectoris: Secondary | ICD-10-CM | POA: Diagnosis not present

## 2020-03-08 DIAGNOSIS — I252 Old myocardial infarction: Secondary | ICD-10-CM | POA: Diagnosis not present

## 2020-03-08 DIAGNOSIS — C61 Malignant neoplasm of prostate: Secondary | ICD-10-CM | POA: Diagnosis not present

## 2020-03-08 DIAGNOSIS — C7951 Secondary malignant neoplasm of bone: Secondary | ICD-10-CM | POA: Diagnosis not present

## 2020-03-08 DIAGNOSIS — I10 Essential (primary) hypertension: Secondary | ICD-10-CM | POA: Diagnosis not present

## 2020-03-08 DIAGNOSIS — E785 Hyperlipidemia, unspecified: Secondary | ICD-10-CM | POA: Diagnosis not present

## 2020-03-08 DIAGNOSIS — R630 Anorexia: Secondary | ICD-10-CM | POA: Diagnosis not present

## 2020-03-08 DIAGNOSIS — G620 Drug-induced polyneuropathy: Secondary | ICD-10-CM | POA: Diagnosis not present

## 2020-03-08 DIAGNOSIS — R634 Abnormal weight loss: Secondary | ICD-10-CM | POA: Diagnosis not present

## 2020-03-08 DIAGNOSIS — I25119 Atherosclerotic heart disease of native coronary artery with unspecified angina pectoris: Secondary | ICD-10-CM | POA: Diagnosis not present

## 2020-03-09 DIAGNOSIS — C7951 Secondary malignant neoplasm of bone: Secondary | ICD-10-CM | POA: Diagnosis not present

## 2020-03-09 DIAGNOSIS — G620 Drug-induced polyneuropathy: Secondary | ICD-10-CM | POA: Diagnosis not present

## 2020-03-09 DIAGNOSIS — I25119 Atherosclerotic heart disease of native coronary artery with unspecified angina pectoris: Secondary | ICD-10-CM | POA: Diagnosis not present

## 2020-03-09 DIAGNOSIS — C61 Malignant neoplasm of prostate: Secondary | ICD-10-CM | POA: Diagnosis not present

## 2020-03-09 DIAGNOSIS — R630 Anorexia: Secondary | ICD-10-CM | POA: Diagnosis not present

## 2020-03-09 DIAGNOSIS — R634 Abnormal weight loss: Secondary | ICD-10-CM | POA: Diagnosis not present

## 2020-03-09 DIAGNOSIS — I252 Old myocardial infarction: Secondary | ICD-10-CM | POA: Diagnosis not present

## 2020-03-09 DIAGNOSIS — E785 Hyperlipidemia, unspecified: Secondary | ICD-10-CM | POA: Diagnosis not present

## 2020-03-09 DIAGNOSIS — I10 Essential (primary) hypertension: Secondary | ICD-10-CM | POA: Diagnosis not present

## 2020-03-10 DIAGNOSIS — I25119 Atherosclerotic heart disease of native coronary artery with unspecified angina pectoris: Secondary | ICD-10-CM | POA: Diagnosis not present

## 2020-03-10 DIAGNOSIS — C7951 Secondary malignant neoplasm of bone: Secondary | ICD-10-CM | POA: Diagnosis not present

## 2020-03-10 DIAGNOSIS — I10 Essential (primary) hypertension: Secondary | ICD-10-CM | POA: Diagnosis not present

## 2020-03-10 DIAGNOSIS — R634 Abnormal weight loss: Secondary | ICD-10-CM | POA: Diagnosis not present

## 2020-03-10 DIAGNOSIS — I252 Old myocardial infarction: Secondary | ICD-10-CM | POA: Diagnosis not present

## 2020-03-10 DIAGNOSIS — C61 Malignant neoplasm of prostate: Secondary | ICD-10-CM | POA: Diagnosis not present

## 2020-03-10 DIAGNOSIS — G620 Drug-induced polyneuropathy: Secondary | ICD-10-CM | POA: Diagnosis not present

## 2020-03-10 DIAGNOSIS — E785 Hyperlipidemia, unspecified: Secondary | ICD-10-CM | POA: Diagnosis not present

## 2020-03-10 DIAGNOSIS — R630 Anorexia: Secondary | ICD-10-CM | POA: Diagnosis not present

## 2020-03-11 DIAGNOSIS — R634 Abnormal weight loss: Secondary | ICD-10-CM | POA: Diagnosis not present

## 2020-03-11 DIAGNOSIS — I25119 Atherosclerotic heart disease of native coronary artery with unspecified angina pectoris: Secondary | ICD-10-CM | POA: Diagnosis not present

## 2020-03-11 DIAGNOSIS — E785 Hyperlipidemia, unspecified: Secondary | ICD-10-CM | POA: Diagnosis not present

## 2020-03-11 DIAGNOSIS — G620 Drug-induced polyneuropathy: Secondary | ICD-10-CM | POA: Diagnosis not present

## 2020-03-11 DIAGNOSIS — I10 Essential (primary) hypertension: Secondary | ICD-10-CM | POA: Diagnosis not present

## 2020-03-11 DIAGNOSIS — I252 Old myocardial infarction: Secondary | ICD-10-CM | POA: Diagnosis not present

## 2020-03-11 DIAGNOSIS — C7951 Secondary malignant neoplasm of bone: Secondary | ICD-10-CM | POA: Diagnosis not present

## 2020-03-11 DIAGNOSIS — R630 Anorexia: Secondary | ICD-10-CM | POA: Diagnosis not present

## 2020-03-11 DIAGNOSIS — C61 Malignant neoplasm of prostate: Secondary | ICD-10-CM | POA: Diagnosis not present

## 2020-03-12 DIAGNOSIS — I25119 Atherosclerotic heart disease of native coronary artery with unspecified angina pectoris: Secondary | ICD-10-CM | POA: Diagnosis not present

## 2020-03-12 DIAGNOSIS — C61 Malignant neoplasm of prostate: Secondary | ICD-10-CM | POA: Diagnosis not present

## 2020-03-12 DIAGNOSIS — I10 Essential (primary) hypertension: Secondary | ICD-10-CM | POA: Diagnosis not present

## 2020-03-12 DIAGNOSIS — R634 Abnormal weight loss: Secondary | ICD-10-CM | POA: Diagnosis not present

## 2020-03-12 DIAGNOSIS — E785 Hyperlipidemia, unspecified: Secondary | ICD-10-CM | POA: Diagnosis not present

## 2020-03-12 DIAGNOSIS — R630 Anorexia: Secondary | ICD-10-CM | POA: Diagnosis not present

## 2020-03-12 DIAGNOSIS — I252 Old myocardial infarction: Secondary | ICD-10-CM | POA: Diagnosis not present

## 2020-03-12 DIAGNOSIS — C7951 Secondary malignant neoplasm of bone: Secondary | ICD-10-CM | POA: Diagnosis not present

## 2020-03-12 DIAGNOSIS — G620 Drug-induced polyneuropathy: Secondary | ICD-10-CM | POA: Diagnosis not present

## 2020-03-13 DIAGNOSIS — I10 Essential (primary) hypertension: Secondary | ICD-10-CM | POA: Diagnosis not present

## 2020-03-13 DIAGNOSIS — C61 Malignant neoplasm of prostate: Secondary | ICD-10-CM | POA: Diagnosis not present

## 2020-03-13 DIAGNOSIS — I25119 Atherosclerotic heart disease of native coronary artery with unspecified angina pectoris: Secondary | ICD-10-CM | POA: Diagnosis not present

## 2020-03-13 DIAGNOSIS — C7951 Secondary malignant neoplasm of bone: Secondary | ICD-10-CM | POA: Diagnosis not present

## 2020-03-13 DIAGNOSIS — R630 Anorexia: Secondary | ICD-10-CM | POA: Diagnosis not present

## 2020-03-13 DIAGNOSIS — E785 Hyperlipidemia, unspecified: Secondary | ICD-10-CM | POA: Diagnosis not present

## 2020-03-13 DIAGNOSIS — G620 Drug-induced polyneuropathy: Secondary | ICD-10-CM | POA: Diagnosis not present

## 2020-03-13 DIAGNOSIS — I252 Old myocardial infarction: Secondary | ICD-10-CM | POA: Diagnosis not present

## 2020-03-13 DIAGNOSIS — R634 Abnormal weight loss: Secondary | ICD-10-CM | POA: Diagnosis not present

## 2020-03-14 DIAGNOSIS — I10 Essential (primary) hypertension: Secondary | ICD-10-CM | POA: Diagnosis not present

## 2020-03-14 DIAGNOSIS — I252 Old myocardial infarction: Secondary | ICD-10-CM | POA: Diagnosis not present

## 2020-03-14 DIAGNOSIS — G620 Drug-induced polyneuropathy: Secondary | ICD-10-CM | POA: Diagnosis not present

## 2020-03-14 DIAGNOSIS — C61 Malignant neoplasm of prostate: Secondary | ICD-10-CM | POA: Diagnosis not present

## 2020-03-14 DIAGNOSIS — C7951 Secondary malignant neoplasm of bone: Secondary | ICD-10-CM | POA: Diagnosis not present

## 2020-03-14 DIAGNOSIS — R634 Abnormal weight loss: Secondary | ICD-10-CM | POA: Diagnosis not present

## 2020-03-14 DIAGNOSIS — E785 Hyperlipidemia, unspecified: Secondary | ICD-10-CM | POA: Diagnosis not present

## 2020-03-14 DIAGNOSIS — R630 Anorexia: Secondary | ICD-10-CM | POA: Diagnosis not present

## 2020-03-14 DIAGNOSIS — I25119 Atherosclerotic heart disease of native coronary artery with unspecified angina pectoris: Secondary | ICD-10-CM | POA: Diagnosis not present

## 2020-03-15 DIAGNOSIS — I25119 Atherosclerotic heart disease of native coronary artery with unspecified angina pectoris: Secondary | ICD-10-CM | POA: Diagnosis not present

## 2020-03-15 DIAGNOSIS — E785 Hyperlipidemia, unspecified: Secondary | ICD-10-CM | POA: Diagnosis not present

## 2020-03-15 DIAGNOSIS — I252 Old myocardial infarction: Secondary | ICD-10-CM | POA: Diagnosis not present

## 2020-03-15 DIAGNOSIS — G620 Drug-induced polyneuropathy: Secondary | ICD-10-CM | POA: Diagnosis not present

## 2020-03-15 DIAGNOSIS — C61 Malignant neoplasm of prostate: Secondary | ICD-10-CM | POA: Diagnosis not present

## 2020-03-15 DIAGNOSIS — C7951 Secondary malignant neoplasm of bone: Secondary | ICD-10-CM | POA: Diagnosis not present

## 2020-03-15 DIAGNOSIS — I10 Essential (primary) hypertension: Secondary | ICD-10-CM | POA: Diagnosis not present

## 2020-03-15 DIAGNOSIS — R630 Anorexia: Secondary | ICD-10-CM | POA: Diagnosis not present

## 2020-03-15 DIAGNOSIS — R634 Abnormal weight loss: Secondary | ICD-10-CM | POA: Diagnosis not present

## 2020-03-16 DIAGNOSIS — I252 Old myocardial infarction: Secondary | ICD-10-CM | POA: Diagnosis not present

## 2020-03-16 DIAGNOSIS — I10 Essential (primary) hypertension: Secondary | ICD-10-CM | POA: Diagnosis not present

## 2020-03-16 DIAGNOSIS — E785 Hyperlipidemia, unspecified: Secondary | ICD-10-CM | POA: Diagnosis not present

## 2020-03-16 DIAGNOSIS — R630 Anorexia: Secondary | ICD-10-CM | POA: Diagnosis not present

## 2020-03-16 DIAGNOSIS — G620 Drug-induced polyneuropathy: Secondary | ICD-10-CM | POA: Diagnosis not present

## 2020-03-16 DIAGNOSIS — R634 Abnormal weight loss: Secondary | ICD-10-CM | POA: Diagnosis not present

## 2020-03-16 DIAGNOSIS — I25119 Atherosclerotic heart disease of native coronary artery with unspecified angina pectoris: Secondary | ICD-10-CM | POA: Diagnosis not present

## 2020-03-16 DIAGNOSIS — C7951 Secondary malignant neoplasm of bone: Secondary | ICD-10-CM | POA: Diagnosis not present

## 2020-03-16 DIAGNOSIS — C61 Malignant neoplasm of prostate: Secondary | ICD-10-CM | POA: Diagnosis not present

## 2020-03-17 DIAGNOSIS — I10 Essential (primary) hypertension: Secondary | ICD-10-CM | POA: Diagnosis not present

## 2020-03-17 DIAGNOSIS — I252 Old myocardial infarction: Secondary | ICD-10-CM | POA: Diagnosis not present

## 2020-03-17 DIAGNOSIS — R630 Anorexia: Secondary | ICD-10-CM | POA: Diagnosis not present

## 2020-03-17 DIAGNOSIS — I25119 Atherosclerotic heart disease of native coronary artery with unspecified angina pectoris: Secondary | ICD-10-CM | POA: Diagnosis not present

## 2020-03-17 DIAGNOSIS — R634 Abnormal weight loss: Secondary | ICD-10-CM | POA: Diagnosis not present

## 2020-03-17 DIAGNOSIS — C7951 Secondary malignant neoplasm of bone: Secondary | ICD-10-CM | POA: Diagnosis not present

## 2020-03-17 DIAGNOSIS — G620 Drug-induced polyneuropathy: Secondary | ICD-10-CM | POA: Diagnosis not present

## 2020-03-17 DIAGNOSIS — E785 Hyperlipidemia, unspecified: Secondary | ICD-10-CM | POA: Diagnosis not present

## 2020-03-17 DIAGNOSIS — C61 Malignant neoplasm of prostate: Secondary | ICD-10-CM | POA: Diagnosis not present

## 2020-03-18 ENCOUNTER — Other Ambulatory Visit: Payer: Self-pay | Admitting: Hospice and Palliative Medicine

## 2020-03-18 DIAGNOSIS — C61 Malignant neoplasm of prostate: Secondary | ICD-10-CM | POA: Diagnosis not present

## 2020-03-18 DIAGNOSIS — R630 Anorexia: Secondary | ICD-10-CM | POA: Diagnosis not present

## 2020-03-18 DIAGNOSIS — C7951 Secondary malignant neoplasm of bone: Secondary | ICD-10-CM

## 2020-03-18 DIAGNOSIS — R634 Abnormal weight loss: Secondary | ICD-10-CM | POA: Diagnosis not present

## 2020-03-18 DIAGNOSIS — G620 Drug-induced polyneuropathy: Secondary | ICD-10-CM | POA: Diagnosis not present

## 2020-03-18 DIAGNOSIS — I25119 Atherosclerotic heart disease of native coronary artery with unspecified angina pectoris: Secondary | ICD-10-CM | POA: Diagnosis not present

## 2020-03-18 DIAGNOSIS — E785 Hyperlipidemia, unspecified: Secondary | ICD-10-CM | POA: Diagnosis not present

## 2020-03-18 DIAGNOSIS — I252 Old myocardial infarction: Secondary | ICD-10-CM | POA: Diagnosis not present

## 2020-03-18 DIAGNOSIS — I10 Essential (primary) hypertension: Secondary | ICD-10-CM | POA: Diagnosis not present

## 2020-03-18 DIAGNOSIS — G893 Neoplasm related pain (acute) (chronic): Secondary | ICD-10-CM

## 2020-03-18 MED ORDER — LORAZEPAM 0.5 MG PO TABS: 0.5000 mg | ORAL_TABLET | ORAL | 0 refills | Status: DC | PRN

## 2020-03-18 MED ORDER — DEXAMETHASONE 4 MG PO TABS: 4.0000 mg | ORAL_TABLET | Freq: Two times a day (BID) | ORAL | 1 refills | Status: DC

## 2020-03-18 MED ORDER — FENTANYL 100 MCG/HR TD PT72: 1.0000 | MEDICATED_PATCH | TRANSDERMAL | 0 refills | Status: DC

## 2020-03-18 MED ORDER — MORPHINE SULFATE (CONCENTRATE) 10 MG /0.5 ML PO SOLN: 5.0000 mg | ORAL | 0 refills | Status: DC | PRN

## 2020-03-18 MED ORDER — FENTANYL 50 MCG/HR TD PT72: 1.0000 | MEDICATED_PATCH | TRANSDERMAL | 0 refills | Status: DC

## 2020-03-18 MED ORDER — GABAPENTIN 300 MG PO CAPS: 300.0000 mg | ORAL_CAPSULE | Freq: Three times a day (TID) | ORAL | 2 refills | Status: DC

## 2020-03-18 NOTE — Progress Notes (Signed)
I spoke with patient's hospice nurse while she was in the home with patient and wife.  We discussed his pain regimen.  Patient is now taking morphine predominantly for breakthrough pain.  We will discontinue oxycodone.  We discussed rotating from transdermal fentanyl to MS Contin but patient felt like the transdermal fentanyl is still mostly effective at least for the first day.  He finds he has increased breakthrough pain on day 2 of the fentanyl patch.  We will refill medications today.  Patient to see Dr. Rogue Bussing next week.

## 2020-03-19 DIAGNOSIS — C7951 Secondary malignant neoplasm of bone: Secondary | ICD-10-CM | POA: Diagnosis not present

## 2020-03-19 DIAGNOSIS — C61 Malignant neoplasm of prostate: Secondary | ICD-10-CM | POA: Diagnosis not present

## 2020-03-19 DIAGNOSIS — E785 Hyperlipidemia, unspecified: Secondary | ICD-10-CM | POA: Diagnosis not present

## 2020-03-19 DIAGNOSIS — I25119 Atherosclerotic heart disease of native coronary artery with unspecified angina pectoris: Secondary | ICD-10-CM | POA: Diagnosis not present

## 2020-03-19 DIAGNOSIS — I252 Old myocardial infarction: Secondary | ICD-10-CM | POA: Diagnosis not present

## 2020-03-19 DIAGNOSIS — I10 Essential (primary) hypertension: Secondary | ICD-10-CM | POA: Diagnosis not present

## 2020-03-19 DIAGNOSIS — R630 Anorexia: Secondary | ICD-10-CM | POA: Diagnosis not present

## 2020-03-19 DIAGNOSIS — G620 Drug-induced polyneuropathy: Secondary | ICD-10-CM | POA: Diagnosis not present

## 2020-03-19 DIAGNOSIS — R634 Abnormal weight loss: Secondary | ICD-10-CM | POA: Diagnosis not present

## 2020-03-20 DIAGNOSIS — I10 Essential (primary) hypertension: Secondary | ICD-10-CM | POA: Diagnosis not present

## 2020-03-20 DIAGNOSIS — R634 Abnormal weight loss: Secondary | ICD-10-CM | POA: Diagnosis not present

## 2020-03-20 DIAGNOSIS — R630 Anorexia: Secondary | ICD-10-CM | POA: Diagnosis not present

## 2020-03-20 DIAGNOSIS — E785 Hyperlipidemia, unspecified: Secondary | ICD-10-CM | POA: Diagnosis not present

## 2020-03-20 DIAGNOSIS — G620 Drug-induced polyneuropathy: Secondary | ICD-10-CM | POA: Diagnosis not present

## 2020-03-20 DIAGNOSIS — I252 Old myocardial infarction: Secondary | ICD-10-CM | POA: Diagnosis not present

## 2020-03-20 DIAGNOSIS — I25119 Atherosclerotic heart disease of native coronary artery with unspecified angina pectoris: Secondary | ICD-10-CM | POA: Diagnosis not present

## 2020-03-20 DIAGNOSIS — C7951 Secondary malignant neoplasm of bone: Secondary | ICD-10-CM | POA: Diagnosis not present

## 2020-03-20 DIAGNOSIS — C61 Malignant neoplasm of prostate: Secondary | ICD-10-CM | POA: Diagnosis not present

## 2020-03-21 DIAGNOSIS — I10 Essential (primary) hypertension: Secondary | ICD-10-CM | POA: Diagnosis not present

## 2020-03-21 DIAGNOSIS — C61 Malignant neoplasm of prostate: Secondary | ICD-10-CM | POA: Diagnosis not present

## 2020-03-21 DIAGNOSIS — C7951 Secondary malignant neoplasm of bone: Secondary | ICD-10-CM | POA: Diagnosis not present

## 2020-03-21 DIAGNOSIS — I252 Old myocardial infarction: Secondary | ICD-10-CM | POA: Diagnosis not present

## 2020-03-21 DIAGNOSIS — R634 Abnormal weight loss: Secondary | ICD-10-CM | POA: Diagnosis not present

## 2020-03-21 DIAGNOSIS — I25119 Atherosclerotic heart disease of native coronary artery with unspecified angina pectoris: Secondary | ICD-10-CM | POA: Diagnosis not present

## 2020-03-21 DIAGNOSIS — G620 Drug-induced polyneuropathy: Secondary | ICD-10-CM | POA: Diagnosis not present

## 2020-03-21 DIAGNOSIS — R630 Anorexia: Secondary | ICD-10-CM | POA: Diagnosis not present

## 2020-03-21 DIAGNOSIS — E785 Hyperlipidemia, unspecified: Secondary | ICD-10-CM | POA: Diagnosis not present

## 2020-03-22 DIAGNOSIS — E785 Hyperlipidemia, unspecified: Secondary | ICD-10-CM | POA: Diagnosis not present

## 2020-03-22 DIAGNOSIS — C7951 Secondary malignant neoplasm of bone: Secondary | ICD-10-CM | POA: Diagnosis not present

## 2020-03-22 DIAGNOSIS — R630 Anorexia: Secondary | ICD-10-CM | POA: Diagnosis not present

## 2020-03-22 DIAGNOSIS — C61 Malignant neoplasm of prostate: Secondary | ICD-10-CM | POA: Diagnosis not present

## 2020-03-22 DIAGNOSIS — R634 Abnormal weight loss: Secondary | ICD-10-CM | POA: Diagnosis not present

## 2020-03-22 DIAGNOSIS — G620 Drug-induced polyneuropathy: Secondary | ICD-10-CM | POA: Diagnosis not present

## 2020-03-22 DIAGNOSIS — I25119 Atherosclerotic heart disease of native coronary artery with unspecified angina pectoris: Secondary | ICD-10-CM | POA: Diagnosis not present

## 2020-03-22 DIAGNOSIS — I252 Old myocardial infarction: Secondary | ICD-10-CM | POA: Diagnosis not present

## 2020-03-22 DIAGNOSIS — I10 Essential (primary) hypertension: Secondary | ICD-10-CM | POA: Diagnosis not present

## 2020-03-23 DIAGNOSIS — I25119 Atherosclerotic heart disease of native coronary artery with unspecified angina pectoris: Secondary | ICD-10-CM | POA: Diagnosis not present

## 2020-03-23 DIAGNOSIS — I252 Old myocardial infarction: Secondary | ICD-10-CM | POA: Diagnosis not present

## 2020-03-23 DIAGNOSIS — I10 Essential (primary) hypertension: Secondary | ICD-10-CM | POA: Diagnosis not present

## 2020-03-23 DIAGNOSIS — C7951 Secondary malignant neoplasm of bone: Secondary | ICD-10-CM | POA: Diagnosis not present

## 2020-03-23 DIAGNOSIS — C61 Malignant neoplasm of prostate: Secondary | ICD-10-CM | POA: Diagnosis not present

## 2020-03-23 DIAGNOSIS — R630 Anorexia: Secondary | ICD-10-CM | POA: Diagnosis not present

## 2020-03-23 DIAGNOSIS — R634 Abnormal weight loss: Secondary | ICD-10-CM | POA: Diagnosis not present

## 2020-03-23 DIAGNOSIS — E785 Hyperlipidemia, unspecified: Secondary | ICD-10-CM | POA: Diagnosis not present

## 2020-03-23 DIAGNOSIS — G620 Drug-induced polyneuropathy: Secondary | ICD-10-CM | POA: Diagnosis not present

## 2020-03-24 ENCOUNTER — Inpatient Hospital Stay: Payer: 59 | Attending: Internal Medicine

## 2020-03-24 ENCOUNTER — Encounter: Payer: Self-pay | Admitting: Internal Medicine

## 2020-03-24 ENCOUNTER — Other Ambulatory Visit: Payer: Self-pay | Admitting: Internal Medicine

## 2020-03-24 ENCOUNTER — Inpatient Hospital Stay (HOSPITAL_BASED_OUTPATIENT_CLINIC_OR_DEPARTMENT_OTHER): Payer: 59 | Admitting: Internal Medicine

## 2020-03-24 ENCOUNTER — Other Ambulatory Visit: Payer: Self-pay

## 2020-03-24 DIAGNOSIS — E785 Hyperlipidemia, unspecified: Secondary | ICD-10-CM | POA: Diagnosis not present

## 2020-03-24 DIAGNOSIS — I252 Old myocardial infarction: Secondary | ICD-10-CM | POA: Diagnosis not present

## 2020-03-24 DIAGNOSIS — Z79899 Other long term (current) drug therapy: Secondary | ICD-10-CM | POA: Insufficient documentation

## 2020-03-24 DIAGNOSIS — Z8249 Family history of ischemic heart disease and other diseases of the circulatory system: Secondary | ICD-10-CM | POA: Diagnosis not present

## 2020-03-24 DIAGNOSIS — I10 Essential (primary) hypertension: Secondary | ICD-10-CM | POA: Insufficient documentation

## 2020-03-24 DIAGNOSIS — Z7952 Long term (current) use of systemic steroids: Secondary | ICD-10-CM | POA: Insufficient documentation

## 2020-03-24 DIAGNOSIS — C61 Malignant neoplasm of prostate: Secondary | ICD-10-CM

## 2020-03-24 DIAGNOSIS — C7951 Secondary malignant neoplasm of bone: Secondary | ICD-10-CM | POA: Diagnosis not present

## 2020-03-24 DIAGNOSIS — R630 Anorexia: Secondary | ICD-10-CM | POA: Diagnosis not present

## 2020-03-24 DIAGNOSIS — Z9079 Acquired absence of other genital organ(s): Secondary | ICD-10-CM | POA: Insufficient documentation

## 2020-03-24 DIAGNOSIS — I25119 Atherosclerotic heart disease of native coronary artery with unspecified angina pectoris: Secondary | ICD-10-CM | POA: Diagnosis not present

## 2020-03-24 DIAGNOSIS — Z7982 Long term (current) use of aspirin: Secondary | ICD-10-CM | POA: Diagnosis not present

## 2020-03-24 DIAGNOSIS — B370 Candidal stomatitis: Secondary | ICD-10-CM | POA: Diagnosis not present

## 2020-03-24 DIAGNOSIS — G620 Drug-induced polyneuropathy: Secondary | ICD-10-CM | POA: Diagnosis not present

## 2020-03-24 DIAGNOSIS — Z87891 Personal history of nicotine dependence: Secondary | ICD-10-CM | POA: Insufficient documentation

## 2020-03-24 DIAGNOSIS — R634 Abnormal weight loss: Secondary | ICD-10-CM | POA: Diagnosis not present

## 2020-03-24 LAB — COMPREHENSIVE METABOLIC PANEL
ALT: 19 U/L (ref 0–44)
AST: 15 U/L (ref 15–41)
Albumin: 3.5 g/dL (ref 3.5–5.0)
Alkaline Phosphatase: 57 U/L (ref 38–126)
Anion gap: 9 (ref 5–15)
BUN: 14 mg/dL (ref 8–23)
CO2: 28 mmol/L (ref 22–32)
Calcium: 9.4 mg/dL (ref 8.9–10.3)
Chloride: 98 mmol/L (ref 98–111)
Creatinine, Ser: 0.63 mg/dL (ref 0.61–1.24)
GFR, Estimated: >60 mL/min (ref >60)
Glucose, Bld: 113 mg/dL — ABNORMAL HIGH (ref 70–99)
Potassium: 4.2 mmol/L (ref 3.5–5.1)
Sodium: 135 mmol/L (ref 135–145)
Total Bilirubin: 0.5 mg/dL (ref 0.3–1.2)
Total Protein: 6.5 g/dL (ref 6.5–8.1)

## 2020-03-24 LAB — CBC WITH DIFFERENTIAL/PLATELET
Abs Immature Granulocytes: 0.16 10*3/uL — ABNORMAL HIGH (ref 0.00–0.07)
Basophils Absolute: 0 10*3/uL (ref 0.0–0.1)
Basophils Relative: 0 %
Eosinophils Absolute: 0 10*3/uL (ref 0.0–0.5)
Eosinophils Relative: 0 %
HCT: 37.2 % — ABNORMAL LOW (ref 39.0–52.0)
Hemoglobin: 12 g/dL — ABNORMAL LOW (ref 13.0–17.0)
Immature Granulocytes: 2 %
Lymphocytes Relative: 16 %
Lymphs Abs: 1.5 10*3/uL (ref 0.7–4.0)
MCH: 30.6 pg (ref 26.0–34.0)
MCHC: 32.3 g/dL (ref 30.0–36.0)
MCV: 94.9 fL (ref 80.0–100.0)
Monocytes Absolute: 0.6 10*3/uL (ref 0.1–1.0)
Monocytes Relative: 7 %
Neutro Abs: 7.3 10*3/uL (ref 1.7–7.7)
Neutrophils Relative %: 75 %
Platelets: 192 10*3/uL (ref 150–400)
RBC: 3.92 MIL/uL — ABNORMAL LOW (ref 4.22–5.81)
RDW: 14.6 % (ref 11.5–15.5)
WBC: 9.6 10*3/uL (ref 4.0–10.5)
nRBC: 0 % (ref 0.0–0.2)

## 2020-03-24 LAB — PSA: Prostatic Specific Antigen: 786 ng/mL — ABNORMAL HIGH (ref 0.00–4.00)

## 2020-03-24 MED ORDER — NYSTATIN 100000 UNIT/ML MT SUSP: 5.0000 mL | Freq: Three times a day (TID) | OROMUCOSAL | 0 refills | Status: DC

## 2020-03-24 MED ORDER — TRAZODONE HCL 50 MG PO TABS: 50.0000 mg | ORAL_TABLET | Freq: Every evening | ORAL | 2 refills | Status: DC | PRN

## 2020-03-24 NOTE — Progress Notes (Signed)
[place] OFFICE PROGRESS NOTE  Patient Care Team: Olin Hauser, [name], MD as PCP - Cardiology (Cardiology) Minna Merritts, [name], [name]  Cancer Staging No matching staging information was found for the patient.   Oncology History Overview Note  # 2011- PROSTATE CANCER [Gleason 3+4]; s/p Prostatectomy [ also involved bladder neck/ECP; Dr.Polaseck]; July 2014- Biochemical recurrence [PSA 14]- started on Zoladex [Dr.Pandit]; lost to follow up.  # JAN 2017- STAGE IV METASTATIC PROSTATE Cancer to Bone- Feb 13th, 2017-  Lupron q 21m[~end of feb]; PSA: 1021; Declined Chemo; April 2017 [xofigo x6; Dr.Crystal]; AUG 2017- Zytiga + Prednisone BID. Bone scan-Jan 2018- improved skeletal metastases.   # MAY 1st 2019- START X-tandi [stopped zytiga/PSA- 8.8/rising]  #August 23, 2018-stop Xtandi [intolerance-fatigue mental fogginess]  #Mid September 2020-apalutamide; stopped mid-October poor tolerance/progression  # 11/01/2018- -Taxotere weekly [consent]; July 2021-progression of disease/PSA   # 08/13/2019-cabazitaxel; Neulasta  # Mets to bone- start X-geva q 82M [May 30th]; hold dental issues/infections  # Smoker/ chronic pain/pain clinic   #[April 2018; [place]]- s/p stenting; on Plavix; --------------------------------------------------------------  DIAGNOSIS: '[ ]'  Castrate resistant prostate cancer  STAGE:   4    ;GOALS: Palliative  CURRENT/MOST RECENT THERAPY-cabazitaxel    [place]  12/08/2014 Initial Diagnosis   Prostate cancer metastatic to bone ([facility]   11/01/2018 - 07/09/2019 Chemotherapy   The patient had DOCEtaxel (TAXOTERE) 70 mg in sodium chloride 0.9 % 150 mL chemo infusion, 36 mg/m2 = 70 mg, Intravenous,  Once, 19 of 21 cycles Administration: 70 mg (11/08/2018),  70 mg (11/22/2018), 70 mg (12/13/2018), 70 mg (12/06/2018), 70 mg (12/31/2018), 70 mg (01/07/2019), 70 mg (01/29/2019), 70 mg (12/20/2018), 70 mg (02/06/2019), 70 mg (02/14/2019), 70 mg (02/28/2019), 70 mg (03/14/2019), 70 mg (03/28/2019), 70 mg (04/15/2019), 70 mg (04/29/2019), 70 mg (05/27/2019), 70 mg (06/24/2019), 70 mg (07/09/2019)  for chemotherapy treatment.    08/13/2019 -  Chemotherapy   The patient had pegfilgrastim (NEULASTA) injection 6 mg, 6 mg, Subcutaneous, Once, 3 of 6 cycles Administration: 6 mg (08/14/2019), 6 mg (09/19/2019) cabazitaxel (JEVTANA) 38 mg in sodium chloride 0.9 % 250 mL chemo infusion, 20 mg/m2 = 38 mg, Intravenous,  Once, 3 of 6 cycles Dose modification: 16 mg/m2 (original dose 20 mg/m2, Cycle 3, Reason: Provider Judgment) Administration: 38 mg (08/13/2019), 30 mg (09/18/2019)  for chemotherapy treatment.     INTERVAL HISTORY:  Robert [id number].[id number].  male pleasant patient above history of metastatic castrate resistant prostate cancer currently enrolled in hospice is here for follow-up.  Patient continues to complain of chronic fatigue.  Chronic shortness of breath on exertion.  Is currently on dexamethasone 2 mg twice a day.  Patient complains of difficulty swallowing pain with swallowing.  Patient continues to complain of back pain-narcotic  Review of Systems  Constitutional: Positive for malaise/fatigue and weight loss. Negative for chills, diaphoresis and fever.  HENT: Negative for nosebleeds and sore throat.   Eyes: Negative for double vision.  Respiratory: Positive for shortness of breath. Negative for cough, hemoptysis, sputum production and wheezing.   Cardiovascular: Negative for chest pain, palpitations and orthopnea.  Gastrointestinal: Positive for nausea. Negative for abdominal pain, blood in stool, diarrhea, heartburn, melena and vomiting.  Genitourinary: Negative for dysuria, frequency and urgency.  Musculoskeletal: Positive for back pain and joint pain.  Skin:  Negative for  itching.  Neurological: Negative for dizziness, tingling, focal weakness, weakness and headaches.  Endo/Heme/Allergies: Does not bruise/bleed easily.  Psychiatric/Behavioral: Negative for depression. The patient is not nervous/anxious and does not have insomnia.       PAST MEDICAL HISTORY :  Past Medical History:  Diagnosis Date  . Back pain 10/09/2012  . Bone cancer ([facility])   . CAD (coronary artery disease)    a. 08/2015 PCI: LM nl, LAD 20d, LCX nl, RCA 64m RPAV 95 (3.0x18 Xience Alpine DES);  b. 04/2016 PCI ([facility]: RPL 95 (2.75x8 Promus Premier DES).  . Cancer associated pain   . Depression   . History of echocardiogram    a. 08/2016 Echo: EF 50-55%.  .Marland KitchenHistory of kidney stones   . Hyperlipidemia   . Hypertension   . Joint pain   . Prostate cancer ([facility]    a.  s/p prostatectomy ([facility]);  b. bone mets noted 04/2016.  . Right arm pain 01/10/2016  . Right foot pain 01/10/2016  . Right leg pain 01/10/2016  . Tobacco abuse     PAST SURGICAL HISTORY :   Past Surgical History:  Procedure Laterality Date  . CARDIAC CATHETERIZATION     [facility]  . CARDIAC CATHETERIZATION N/A 08/16/2015   Procedure: Left Heart Cath and Coronary Angiography;  Surgeon: DYolonda Kida MD;  [facility];  Service: Cardiovascular;  Laterality: N/A;  . CARDIAC CATHETERIZATION N/A 08/16/2015   Procedure: Coronary Stent Intervention;  Surgeon: DYolonda Kida MD;  [facility];  Service: Cardiovascular;  Laterality: N/A;  . CERVIX SURGERY    . PROSTATECTOMY    . SPINE SURGERY    . TONSILLECTOMY    . WRIST SURGERY      FAMILY HISTORY :   Family History  Problem Relation Age of Onset  . Heart attack Mother   . Hypertension Mother   . Heart attack Father     SOCIAL HISTORY:   Social History   Tobacco Use  . Smoking status: Former Smoker    Packs/day: 0.50    Years: 40.00    Pack years: 20.00    Types: Cigarettes  . Smokeless tobacco: Current  User    Types: Chew  . Tobacco comment: 1 pack every couple days   Vaping Use  . Vaping Use: Never used  Substance Use Topics  . Alcohol use: Yes    Comment: occasionally  . Drug use: No    ALLERGIES:  is allergic to ditropan [oxybutynin].  MEDICATIONS:  Current Outpatient Medications  Medication Sig Dispense Refill  . acetaminophen (TYLENOL) 500 MG tablet Take 1,000 mg by mouth every 8 (eight) hours as needed.     .Marland Kitchenalbuterol (PROVENTIL HFA;VENTOLIN HFA) 108 (90 Base) MCG/ACT inhaler Inhale 1-2 puffs into the lungs every 6 (six) hours as needed for wheezing or shortness of breath. 1 Inhaler 0  . aspirin 81 MG tablet Take 81 mg by mouth daily.    .Marland Kitchendexamethasone (DECADRON) 4 MG tablet Take 1 tablet (4 mg total) by mouth 2 (two) times daily with a meal. 30 tablet 1  . fentaNYL (DURAGESIC) 100 MCG/HR Place 1 patch onto the skin every other day. Along with 5108m patch for total of 150 mcg every 2 days. 15 patch 0  . fentaNYL (DURAGESIC) 50 MCG/HR Place 1 patch onto the skin every other day. Along with 10080mpatch for total of 150 mcg every 2 days. 15 patch 0  . gabapentin (NEURONTIN) 300  MG capsule Take 1 capsule (300 mg total) by mouth 3 (three) times daily. 90 capsule 2  . LORazepam (ATIVAN) 0.5 MG tablet Take 1 tablet (0.5 mg total) by mouth every 4 (four) hours as needed for anxiety. 60 tablet 0  . magic mouthwash SOLN Take 5 mLs by mouth 4 (four) times daily. 240 mL 0  . metoprolol tartrate (LOPRESSOR) 25 MG tablet Take 1 tablet (25 mg total) by mouth 2 (two) times daily. 60 tablet 1  . Morphine Sulfate (MORPHINE CONCENTRATE) 10 mg / 0.5 ml concentrated solution Take 0.25-0.5 mLs (5-10 mg total) by mouth every 2 (two) hours as needed for severe pain. 30 mL 0  . naloxone (NARCAN) nasal spray 4 mg/0.1 mL 1 spray into nostril upon signs of opioid overdose. Call 911. May repeat once if no response within 2-3 minutes. 1 kit 0  . nystatin (MYCOSTATIN) 100000 UNIT/ML suspension Take 5 mLs  (500,000 Units total) by mouth 3 (three) times daily. 300 mL 0  . ondansetron (ZOFRAN) 8 MG tablet Take 8 mg by mouth every 8 (eight) hours as needed for nausea or vomiting.    . polyethylene glycol (MIRALAX / GLYCOLAX) packet Take 17 g by mouth daily as needed for moderate constipation.     . promethazine (PHENERGAN) 25 MG tablet TAKE 1/2 TO 1 TABLET (12.5-25 MG TOTAL) BY MOUTH EVERY 8 (EIGHT) HOURS AS NEEDED FOR NAUSEA OR VOMITING. 60 tablet 3  . atorvastatin (LIPITOR) 80 MG tablet Take 1 tablet (80 mg total) by mouth daily at 6 PM. (Patient not taking: Reported on 03/24/2020) 90 tablet 3  . clopidogrel (PLAVIX) 75 MG tablet Take 1 tablet (75 mg total) by mouth daily. (Patient not taking: Reported on 03/24/2020) 90 tablet 3  . nitroGLYCERIN (NITROSTAT) 0.4 MG SL tablet Place 1 tablet (0.4 mg total) under the tongue every 5 (five) minutes as needed. (Patient not taking: Reported on 03/24/2020) 25 tablet 3  . traZODone (DESYREL) 50 MG tablet Take 1-2 tablets (50-100 mg total) by mouth at bedtime as needed for sleep. 60 tablet 2   No current facility-administered medications for this visit.    PHYSICAL EXAMINATION: ECOG PERFORMANCE STATUS: 1 - Symptomatic but completely ambulatory  BP (!) 179/94 (BP Location: Left Arm, Patient Position: Sitting, Cuff Size: Normal)   Pulse 83   Temp (!) 97.5 F (36.4 C) (Tympanic)   Resp 16   Ht '5\' 11"'  (1.803 m)   Wt 155 lb (70.3 kg)   SpO2 96%   BMI 21.62 kg/m   Filed Weights   03/24/20 1434  Weight: 155 lb (70.3 kg)    Physical Exam Constitutional:      Comments:  Frail-appearing Caucasian male patient.  He is in a wheelchair.Marland Kitchen  He is accompanied by his wife.  HENT:     Head: Normocephalic and atraumatic.     Mouth/Throat:     Pharynx: No oropharyngeal exudate.     Comments: Positive for thrush in oral mucosa. Eyes:     Pupils: Pupils are equal, round, and reactive to light.  Cardiovascular:     Rate and Rhythm: Normal rate and regular rhythm.   Pulmonary:     Effort: No respiratory distress.     Breath sounds: No wheezing.  Abdominal:     General: Bowel sounds are normal. There is no distension.     Palpations: Abdomen is soft. There is no mass.     Tenderness: There is no abdominal tenderness. There is no guarding or  rebound.  Musculoskeletal:        General: No tenderness. Normal range of motion.     Cervical back: Normal range of motion and neck supple.     Comments: .  Skin:    General: Skin is warm.     Comments: Macular rash on hands  Neurological:     Mental Status: He is alert and oriented to person, place, and time.  Psychiatric:        Mood and Affect: Affect normal.     LABORATORY DATA:  I have reviewed the data as listed    Component Value Date/Time   NA 135 03/24/2020 1415   NA 140 07/13/2013 1542   K 4.2 03/24/2020 1415   K 4.3 07/13/2013 1542   CL 98 03/24/2020 1415   CL 107 07/13/2013 1542   CO2 28 03/24/2020 1415   CO2 26 07/13/2013 1542   GLUCOSE 113 (H) 03/24/2020 1415   GLUCOSE 106 (H) 07/13/2013 1542   BUN 14 03/24/2020 1415   BUN 11 07/13/2013 1542   CREATININE 0.63 03/24/2020 1415   CREATININE 0.76 10/15/2017 1001   CALCIUM 9.4 03/24/2020 1415   CALCIUM 9.9 07/13/2013 1542   PROT 6.5 03/24/2020 1415   PROT 8.1 07/13/2013 1542   ALBUMIN 3.5 03/24/2020 1415   ALBUMIN 4.3 07/13/2013 1542   AST 15 03/24/2020 1415   AST 26 07/13/2013 1542   ALT 19 03/24/2020 1415   ALT 32 07/13/2013 1542   ALKPHOS 57 03/24/2020 1415   ALKPHOS 74 07/13/2013 1542   BILITOT 0.5 03/24/2020 1415   BILITOT 0.4 07/13/2013 1542   GFRNONAA >60 03/24/2020 1415   GFRNONAA 95 10/15/2017 1001   GFRAA >60 10/03/2019 1349   GFRAA 110 10/15/2017 1001    No results found for: SPEP, UPEP  Lab Results  Component Value Date   WBC 9.6 03/24/2020   NEUTROABS 7.3 03/24/2020   HGB 12.0 (L) 03/24/2020   HCT 37.2 (L) 03/24/2020   MCV 94.9 03/24/2020   PLT 192 03/24/2020      Chemistry      Component Value  Date/Time   NA 135 03/24/2020 1415   NA 140 07/13/2013 1542   K 4.2 03/24/2020 1415   K 4.3 07/13/2013 1542   CL 98 03/24/2020 1415   CL 107 07/13/2013 1542   CO2 28 03/24/2020 1415   CO2 26 07/13/2013 1542   BUN 14 03/24/2020 1415   BUN 11 07/13/2013 1542   CREATININE 0.63 03/24/2020 1415   CREATININE 0.76 10/15/2017 1001      Component Value Date/Time   CALCIUM 9.4 03/24/2020 1415   CALCIUM 9.9 07/13/2013 1542   ALKPHOS 57 03/24/2020 1415   ALKPHOS 74 07/13/2013 1542   AST 15 03/24/2020 1415   AST 26 07/13/2013 1542   ALT 19 03/24/2020 1415   ALT 32 07/13/2013 1542   BILITOT 0.5 03/24/2020 1415   BILITOT 0.4 07/13/2013 1542     RADIOGRAPHIC STUDIES: I have personally reviewed the radiological images as listed and agreed with the findings in the report. No results found.   ASSESSMENT & PLAN:  Prostate cancer metastatic to bone ([place]) # Castrate resistant prostate cancer metastases to bone. ADT;  MAY 2021- PET- Improved; however PSA- rising.  Most recently on cabazitaxel s/p #2 [80% dose]- poorly tolerated [see below];   #Progressive decline is noted likely from malignancy.  Continue hospice.  #Oral thrush-secondary steroids.  Recommend nystatin.  New prescription given.  #Fatigue-progressive secondary to underlying malignancy.  Continue oral dexamethasone 2 mg p.o. twice a day [taper];   # Malignancy related pain- continue fentanyl patch hydrocodone;. Continue fentanyl patch to every 48 hours and continue oxycodone prn-stable  *Patient preference/follow-up # DISPOSITION: # 3 month - MD; labs- cbc/cmp;PSA-Dr.B  Addendum: Called patient regarding rising PSA; reminded the patient that PSA levels not correspond to any estimate of his life expectancy.  Discussed that unfortunately expected to continue progressive disease; recommend continue hospice.      No orders of the defined types were placed in this encounter.  All questions were answered. The patient knows to  call the clinic with any problems, questions or concerns.      [name], MD 03/29/2020 10:16 PM

## 2020-03-24 NOTE — Assessment & Plan Note (Addendum)
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 ADT;  MAY 2021- PET- Improved; however PSA- rising.  Most recently on cabazitaxel s/p #2 [80% dose]- poorly tolerated [see below];   #Progressive decline is noted likely from malignancy.  Continue hospice.  #Oral thrush-secondary steroids.  Recommend nystatin.  New prescription given.  #Fatigue-progressive secondary to underlying malignancy.  Continue oral dexamethasone 2 mg p.o. twice a day [taper];   # Malignancy related pain- continue fentanyl patch hydrocodone;. Continue fentanyl patch to every 48 hours and continue oxycodone prn-stable  *Patient preference/follow-up # DISPOSITION: # 3 month - MD; labs- cbc/cmp;PSA-Dr.B  Addendum: Called patient regarding rising PSA; reminded the patient that PSA levels not correspond to any estimate of his life expectancy.  Discussed that unfortunately expected to continue progressive disease; recommend continue hospice.

## 2020-03-24 NOTE — Progress Notes (Signed)
Leg strength is weak. Has had multiple falls within the last month. Just feeling weak in general.

## 2020-03-25 ENCOUNTER — Telehealth: Payer: Self-pay | Admitting: Hospice and Palliative Medicine

## 2020-03-25 ENCOUNTER — Telehealth: Payer: Self-pay | Admitting: Internal Medicine

## 2020-03-25 DIAGNOSIS — C61 Malignant neoplasm of prostate: Secondary | ICD-10-CM | POA: Diagnosis not present

## 2020-03-25 DIAGNOSIS — I252 Old myocardial infarction: Secondary | ICD-10-CM | POA: Diagnosis not present

## 2020-03-25 DIAGNOSIS — C7951 Secondary malignant neoplasm of bone: Secondary | ICD-10-CM | POA: Diagnosis not present

## 2020-03-25 DIAGNOSIS — I25119 Atherosclerotic heart disease of native coronary artery with unspecified angina pectoris: Secondary | ICD-10-CM | POA: Diagnosis not present

## 2020-03-25 DIAGNOSIS — R630 Anorexia: Secondary | ICD-10-CM | POA: Diagnosis not present

## 2020-03-25 DIAGNOSIS — I10 Essential (primary) hypertension: Secondary | ICD-10-CM | POA: Diagnosis not present

## 2020-03-25 DIAGNOSIS — G620 Drug-induced polyneuropathy: Secondary | ICD-10-CM | POA: Diagnosis not present

## 2020-03-25 DIAGNOSIS — R634 Abnormal weight loss: Secondary | ICD-10-CM | POA: Diagnosis not present

## 2020-03-25 DIAGNOSIS — E785 Hyperlipidemia, unspecified: Secondary | ICD-10-CM | POA: Diagnosis not present

## 2020-03-25 NOTE — Telephone Encounter (Signed)
On 3/24-spoke to patient regarding the rising PSA; pain managed through hospice.  Discussed with patient that PSA level only suggestive of progressive disease, however not suggestive of risk of death.

## 2020-03-25 NOTE — Telephone Encounter (Signed)
I received a call from patient's hospice nurse.  Reportedly, patient has utilized about 20 mL of morphine over the past week.  It is estimated that he is using 60 to 80 mg daily and does not feel significant relief from the pain.  Patient has lost a significant amount of weight and is possible that he is not receiving full absorption from his transdermal fentanyl.  I discussed the option of rotating him from transdermal fentanyl to MS Contin but patient was reluctant to do this.  Also discussed the option of rotating from morphine elixir to hydromorphone tablets but patient wanted to adhere to the current regimen for now.  RN will make another visit early next week.

## 2020-03-26 DIAGNOSIS — C7951 Secondary malignant neoplasm of bone: Secondary | ICD-10-CM | POA: Diagnosis not present

## 2020-03-26 DIAGNOSIS — E785 Hyperlipidemia, unspecified: Secondary | ICD-10-CM | POA: Diagnosis not present

## 2020-03-26 DIAGNOSIS — C61 Malignant neoplasm of prostate: Secondary | ICD-10-CM | POA: Diagnosis not present

## 2020-03-26 DIAGNOSIS — R634 Abnormal weight loss: Secondary | ICD-10-CM | POA: Diagnosis not present

## 2020-03-26 DIAGNOSIS — R630 Anorexia: Secondary | ICD-10-CM | POA: Diagnosis not present

## 2020-03-26 DIAGNOSIS — I252 Old myocardial infarction: Secondary | ICD-10-CM | POA: Diagnosis not present

## 2020-03-26 DIAGNOSIS — G620 Drug-induced polyneuropathy: Secondary | ICD-10-CM | POA: Diagnosis not present

## 2020-03-26 DIAGNOSIS — I10 Essential (primary) hypertension: Secondary | ICD-10-CM | POA: Diagnosis not present

## 2020-03-26 DIAGNOSIS — I25119 Atherosclerotic heart disease of native coronary artery with unspecified angina pectoris: Secondary | ICD-10-CM | POA: Diagnosis not present

## 2020-03-27 DIAGNOSIS — R630 Anorexia: Secondary | ICD-10-CM | POA: Diagnosis not present

## 2020-03-27 DIAGNOSIS — C7951 Secondary malignant neoplasm of bone: Secondary | ICD-10-CM | POA: Diagnosis not present

## 2020-03-27 DIAGNOSIS — C61 Malignant neoplasm of prostate: Secondary | ICD-10-CM | POA: Diagnosis not present

## 2020-03-27 DIAGNOSIS — I10 Essential (primary) hypertension: Secondary | ICD-10-CM | POA: Diagnosis not present

## 2020-03-27 DIAGNOSIS — E785 Hyperlipidemia, unspecified: Secondary | ICD-10-CM | POA: Diagnosis not present

## 2020-03-27 DIAGNOSIS — I252 Old myocardial infarction: Secondary | ICD-10-CM | POA: Diagnosis not present

## 2020-03-27 DIAGNOSIS — R634 Abnormal weight loss: Secondary | ICD-10-CM | POA: Diagnosis not present

## 2020-03-27 DIAGNOSIS — G620 Drug-induced polyneuropathy: Secondary | ICD-10-CM | POA: Diagnosis not present

## 2020-03-27 DIAGNOSIS — I25119 Atherosclerotic heart disease of native coronary artery with unspecified angina pectoris: Secondary | ICD-10-CM | POA: Diagnosis not present

## 2020-03-28 DIAGNOSIS — E785 Hyperlipidemia, unspecified: Secondary | ICD-10-CM | POA: Diagnosis not present

## 2020-03-28 DIAGNOSIS — I10 Essential (primary) hypertension: Secondary | ICD-10-CM | POA: Diagnosis not present

## 2020-03-28 DIAGNOSIS — G620 Drug-induced polyneuropathy: Secondary | ICD-10-CM | POA: Diagnosis not present

## 2020-03-28 DIAGNOSIS — C7951 Secondary malignant neoplasm of bone: Secondary | ICD-10-CM | POA: Diagnosis not present

## 2020-03-28 DIAGNOSIS — I25119 Atherosclerotic heart disease of native coronary artery with unspecified angina pectoris: Secondary | ICD-10-CM | POA: Diagnosis not present

## 2020-03-28 DIAGNOSIS — I252 Old myocardial infarction: Secondary | ICD-10-CM | POA: Diagnosis not present

## 2020-03-28 DIAGNOSIS — R634 Abnormal weight loss: Secondary | ICD-10-CM | POA: Diagnosis not present

## 2020-03-28 DIAGNOSIS — C61 Malignant neoplasm of prostate: Secondary | ICD-10-CM | POA: Diagnosis not present

## 2020-03-28 DIAGNOSIS — R630 Anorexia: Secondary | ICD-10-CM | POA: Diagnosis not present

## 2020-03-29 DIAGNOSIS — I10 Essential (primary) hypertension: Secondary | ICD-10-CM | POA: Diagnosis not present

## 2020-03-29 DIAGNOSIS — C61 Malignant neoplasm of prostate: Secondary | ICD-10-CM | POA: Diagnosis not present

## 2020-03-29 DIAGNOSIS — I252 Old myocardial infarction: Secondary | ICD-10-CM | POA: Diagnosis not present

## 2020-03-29 DIAGNOSIS — E785 Hyperlipidemia, unspecified: Secondary | ICD-10-CM | POA: Diagnosis not present

## 2020-03-29 DIAGNOSIS — R630 Anorexia: Secondary | ICD-10-CM | POA: Diagnosis not present

## 2020-03-29 DIAGNOSIS — C7951 Secondary malignant neoplasm of bone: Secondary | ICD-10-CM | POA: Diagnosis not present

## 2020-03-29 DIAGNOSIS — R634 Abnormal weight loss: Secondary | ICD-10-CM | POA: Diagnosis not present

## 2020-03-29 DIAGNOSIS — G620 Drug-induced polyneuropathy: Secondary | ICD-10-CM | POA: Diagnosis not present

## 2020-03-29 DIAGNOSIS — I25119 Atherosclerotic heart disease of native coronary artery with unspecified angina pectoris: Secondary | ICD-10-CM | POA: Diagnosis not present

## 2020-03-30 DIAGNOSIS — I25119 Atherosclerotic heart disease of native coronary artery with unspecified angina pectoris: Secondary | ICD-10-CM | POA: Diagnosis not present

## 2020-03-30 DIAGNOSIS — C61 Malignant neoplasm of prostate: Secondary | ICD-10-CM | POA: Diagnosis not present

## 2020-03-30 DIAGNOSIS — G620 Drug-induced polyneuropathy: Secondary | ICD-10-CM | POA: Diagnosis not present

## 2020-03-30 DIAGNOSIS — I10 Essential (primary) hypertension: Secondary | ICD-10-CM | POA: Diagnosis not present

## 2020-03-30 DIAGNOSIS — E785 Hyperlipidemia, unspecified: Secondary | ICD-10-CM | POA: Diagnosis not present

## 2020-03-30 DIAGNOSIS — I252 Old myocardial infarction: Secondary | ICD-10-CM | POA: Diagnosis not present

## 2020-03-30 DIAGNOSIS — C7951 Secondary malignant neoplasm of bone: Secondary | ICD-10-CM | POA: Diagnosis not present

## 2020-03-30 DIAGNOSIS — R634 Abnormal weight loss: Secondary | ICD-10-CM | POA: Diagnosis not present

## 2020-03-30 DIAGNOSIS — R630 Anorexia: Secondary | ICD-10-CM | POA: Diagnosis not present

## 2020-03-31 ENCOUNTER — Other Ambulatory Visit: Payer: Self-pay | Admitting: Hospice and Palliative Medicine

## 2020-03-31 DIAGNOSIS — G620 Drug-induced polyneuropathy: Secondary | ICD-10-CM | POA: Diagnosis not present

## 2020-03-31 DIAGNOSIS — C61 Malignant neoplasm of prostate: Secondary | ICD-10-CM | POA: Diagnosis not present

## 2020-03-31 DIAGNOSIS — E785 Hyperlipidemia, unspecified: Secondary | ICD-10-CM | POA: Diagnosis not present

## 2020-03-31 DIAGNOSIS — I10 Essential (primary) hypertension: Secondary | ICD-10-CM | POA: Diagnosis not present

## 2020-03-31 DIAGNOSIS — R634 Abnormal weight loss: Secondary | ICD-10-CM | POA: Diagnosis not present

## 2020-03-31 DIAGNOSIS — C7951 Secondary malignant neoplasm of bone: Secondary | ICD-10-CM | POA: Diagnosis not present

## 2020-03-31 DIAGNOSIS — R630 Anorexia: Secondary | ICD-10-CM | POA: Diagnosis not present

## 2020-03-31 DIAGNOSIS — I25119 Atherosclerotic heart disease of native coronary artery with unspecified angina pectoris: Secondary | ICD-10-CM | POA: Diagnosis not present

## 2020-03-31 DIAGNOSIS — I252 Old myocardial infarction: Secondary | ICD-10-CM | POA: Diagnosis not present

## 2020-03-31 MED ORDER — OXYCODONE HCL 15 MG PO TABS: 15.0000 mg | ORAL_TABLET | ORAL | 0 refills | Status: DC | PRN

## 2020-03-31 NOTE — Progress Notes (Signed)
I spoke with hospice nurse. Patient is having difficulty tracking how much of the morphine he is using for pain. Will switch back to oxycodone tablets 15mg  Q4H PRN for BTP. Continue fentanyl.

## 2020-04-01 DIAGNOSIS — I25119 Atherosclerotic heart disease of native coronary artery with unspecified angina pectoris: Secondary | ICD-10-CM | POA: Diagnosis not present

## 2020-04-01 DIAGNOSIS — I252 Old myocardial infarction: Secondary | ICD-10-CM | POA: Diagnosis not present

## 2020-04-01 DIAGNOSIS — G620 Drug-induced polyneuropathy: Secondary | ICD-10-CM | POA: Diagnosis not present

## 2020-04-01 DIAGNOSIS — R630 Anorexia: Secondary | ICD-10-CM | POA: Diagnosis not present

## 2020-04-01 DIAGNOSIS — C7951 Secondary malignant neoplasm of bone: Secondary | ICD-10-CM | POA: Diagnosis not present

## 2020-04-01 DIAGNOSIS — E785 Hyperlipidemia, unspecified: Secondary | ICD-10-CM | POA: Diagnosis not present

## 2020-04-01 DIAGNOSIS — R634 Abnormal weight loss: Secondary | ICD-10-CM | POA: Diagnosis not present

## 2020-04-01 DIAGNOSIS — C61 Malignant neoplasm of prostate: Secondary | ICD-10-CM | POA: Diagnosis not present

## 2020-04-01 DIAGNOSIS — I10 Essential (primary) hypertension: Secondary | ICD-10-CM | POA: Diagnosis not present

## 2020-04-02 DIAGNOSIS — R5382 Chronic fatigue, unspecified: Secondary | ICD-10-CM | POA: Diagnosis not present

## 2020-04-02 DIAGNOSIS — C61 Malignant neoplasm of prostate: Secondary | ICD-10-CM | POA: Diagnosis not present

## 2020-04-02 DIAGNOSIS — M4802 Spinal stenosis, cervical region: Secondary | ICD-10-CM | POA: Diagnosis not present

## 2020-04-02 DIAGNOSIS — Z87891 Personal history of nicotine dependence: Secondary | ICD-10-CM | POA: Diagnosis not present

## 2020-04-02 DIAGNOSIS — Z9079 Acquired absence of other genital organ(s): Secondary | ICD-10-CM | POA: Diagnosis not present

## 2020-04-02 DIAGNOSIS — Z6821 Body mass index (BMI) 21.0-21.9, adult: Secondary | ICD-10-CM | POA: Diagnosis not present

## 2020-04-02 DIAGNOSIS — F17220 Nicotine dependence, chewing tobacco, uncomplicated: Secondary | ICD-10-CM | POA: Diagnosis not present

## 2020-04-02 DIAGNOSIS — F410 Panic disorder [episodic paroxysmal anxiety] without agoraphobia: Secondary | ICD-10-CM | POA: Diagnosis not present

## 2020-04-02 DIAGNOSIS — Z741 Need for assistance with personal care: Secondary | ICD-10-CM | POA: Diagnosis not present

## 2020-04-03 DIAGNOSIS — Z9079 Acquired absence of other genital organ(s): Secondary | ICD-10-CM | POA: Diagnosis not present

## 2020-04-03 DIAGNOSIS — F17220 Nicotine dependence, chewing tobacco, uncomplicated: Secondary | ICD-10-CM | POA: Diagnosis not present

## 2020-04-03 DIAGNOSIS — C61 Malignant neoplasm of prostate: Secondary | ICD-10-CM | POA: Diagnosis not present

## 2020-04-03 DIAGNOSIS — Z6821 Body mass index (BMI) 21.0-21.9, adult: Secondary | ICD-10-CM | POA: Diagnosis not present

## 2020-04-03 DIAGNOSIS — Z87891 Personal history of nicotine dependence: Secondary | ICD-10-CM | POA: Diagnosis not present

## 2020-04-03 DIAGNOSIS — Z741 Need for assistance with personal care: Secondary | ICD-10-CM | POA: Diagnosis not present

## 2020-04-03 DIAGNOSIS — R5382 Chronic fatigue, unspecified: Secondary | ICD-10-CM | POA: Diagnosis not present

## 2020-04-03 DIAGNOSIS — F410 Panic disorder [episodic paroxysmal anxiety] without agoraphobia: Secondary | ICD-10-CM | POA: Diagnosis not present

## 2020-04-03 DIAGNOSIS — M4802 Spinal stenosis, cervical region: Secondary | ICD-10-CM | POA: Diagnosis not present

## 2020-04-04 ENCOUNTER — Other Ambulatory Visit: Payer: Self-pay

## 2020-04-04 DIAGNOSIS — F17220 Nicotine dependence, chewing tobacco, uncomplicated: Secondary | ICD-10-CM | POA: Diagnosis not present

## 2020-04-04 DIAGNOSIS — R5382 Chronic fatigue, unspecified: Secondary | ICD-10-CM | POA: Diagnosis not present

## 2020-04-04 DIAGNOSIS — Z87891 Personal history of nicotine dependence: Secondary | ICD-10-CM | POA: Diagnosis not present

## 2020-04-04 DIAGNOSIS — F410 Panic disorder [episodic paroxysmal anxiety] without agoraphobia: Secondary | ICD-10-CM | POA: Diagnosis not present

## 2020-04-04 DIAGNOSIS — Z9079 Acquired absence of other genital organ(s): Secondary | ICD-10-CM | POA: Diagnosis not present

## 2020-04-04 DIAGNOSIS — C61 Malignant neoplasm of prostate: Secondary | ICD-10-CM | POA: Diagnosis not present

## 2020-04-04 DIAGNOSIS — Z741 Need for assistance with personal care: Secondary | ICD-10-CM | POA: Diagnosis not present

## 2020-04-04 DIAGNOSIS — Z6821 Body mass index (BMI) 21.0-21.9, adult: Secondary | ICD-10-CM | POA: Diagnosis not present

## 2020-04-04 DIAGNOSIS — M4802 Spinal stenosis, cervical region: Secondary | ICD-10-CM | POA: Diagnosis not present

## 2020-04-05 DIAGNOSIS — F17220 Nicotine dependence, chewing tobacco, uncomplicated: Secondary | ICD-10-CM | POA: Diagnosis not present

## 2020-04-05 DIAGNOSIS — M4802 Spinal stenosis, cervical region: Secondary | ICD-10-CM | POA: Diagnosis not present

## 2020-04-05 DIAGNOSIS — Z741 Need for assistance with personal care: Secondary | ICD-10-CM | POA: Diagnosis not present

## 2020-04-05 DIAGNOSIS — Z9079 Acquired absence of other genital organ(s): Secondary | ICD-10-CM | POA: Diagnosis not present

## 2020-04-05 DIAGNOSIS — F410 Panic disorder [episodic paroxysmal anxiety] without agoraphobia: Secondary | ICD-10-CM | POA: Diagnosis not present

## 2020-04-05 DIAGNOSIS — Z87891 Personal history of nicotine dependence: Secondary | ICD-10-CM | POA: Diagnosis not present

## 2020-04-05 DIAGNOSIS — R5382 Chronic fatigue, unspecified: Secondary | ICD-10-CM | POA: Diagnosis not present

## 2020-04-05 DIAGNOSIS — Z6821 Body mass index (BMI) 21.0-21.9, adult: Secondary | ICD-10-CM | POA: Diagnosis not present

## 2020-04-05 DIAGNOSIS — C61 Malignant neoplasm of prostate: Secondary | ICD-10-CM | POA: Diagnosis not present

## 2020-04-06 DIAGNOSIS — F17220 Nicotine dependence, chewing tobacco, uncomplicated: Secondary | ICD-10-CM | POA: Diagnosis not present

## 2020-04-06 DIAGNOSIS — R5382 Chronic fatigue, unspecified: Secondary | ICD-10-CM | POA: Diagnosis not present

## 2020-04-06 DIAGNOSIS — Z741 Need for assistance with personal care: Secondary | ICD-10-CM | POA: Diagnosis not present

## 2020-04-06 DIAGNOSIS — F410 Panic disorder [episodic paroxysmal anxiety] without agoraphobia: Secondary | ICD-10-CM | POA: Diagnosis not present

## 2020-04-06 DIAGNOSIS — Z9079 Acquired absence of other genital organ(s): Secondary | ICD-10-CM | POA: Diagnosis not present

## 2020-04-06 DIAGNOSIS — Z87891 Personal history of nicotine dependence: Secondary | ICD-10-CM | POA: Diagnosis not present

## 2020-04-06 DIAGNOSIS — Z6821 Body mass index (BMI) 21.0-21.9, adult: Secondary | ICD-10-CM | POA: Diagnosis not present

## 2020-04-06 DIAGNOSIS — M4802 Spinal stenosis, cervical region: Secondary | ICD-10-CM | POA: Diagnosis not present

## 2020-04-06 DIAGNOSIS — C61 Malignant neoplasm of prostate: Secondary | ICD-10-CM | POA: Diagnosis not present

## 2020-04-07 DIAGNOSIS — Z87891 Personal history of nicotine dependence: Secondary | ICD-10-CM | POA: Diagnosis not present

## 2020-04-07 DIAGNOSIS — M4802 Spinal stenosis, cervical region: Secondary | ICD-10-CM | POA: Diagnosis not present

## 2020-04-07 DIAGNOSIS — Z741 Need for assistance with personal care: Secondary | ICD-10-CM | POA: Diagnosis not present

## 2020-04-07 DIAGNOSIS — F410 Panic disorder [episodic paroxysmal anxiety] without agoraphobia: Secondary | ICD-10-CM | POA: Diagnosis not present

## 2020-04-07 DIAGNOSIS — Z6821 Body mass index (BMI) 21.0-21.9, adult: Secondary | ICD-10-CM | POA: Diagnosis not present

## 2020-04-07 DIAGNOSIS — C61 Malignant neoplasm of prostate: Secondary | ICD-10-CM | POA: Diagnosis not present

## 2020-04-07 DIAGNOSIS — F17220 Nicotine dependence, chewing tobacco, uncomplicated: Secondary | ICD-10-CM | POA: Diagnosis not present

## 2020-04-07 DIAGNOSIS — R5382 Chronic fatigue, unspecified: Secondary | ICD-10-CM | POA: Diagnosis not present

## 2020-04-07 DIAGNOSIS — Z9079 Acquired absence of other genital organ(s): Secondary | ICD-10-CM | POA: Diagnosis not present

## 2020-04-08 ENCOUNTER — Other Ambulatory Visit: Payer: Self-pay | Admitting: Hospice and Palliative Medicine

## 2020-04-08 DIAGNOSIS — Z87891 Personal history of nicotine dependence: Secondary | ICD-10-CM | POA: Diagnosis not present

## 2020-04-08 DIAGNOSIS — Z9079 Acquired absence of other genital organ(s): Secondary | ICD-10-CM | POA: Diagnosis not present

## 2020-04-08 DIAGNOSIS — Z741 Need for assistance with personal care: Secondary | ICD-10-CM | POA: Diagnosis not present

## 2020-04-08 DIAGNOSIS — R5382 Chronic fatigue, unspecified: Secondary | ICD-10-CM | POA: Diagnosis not present

## 2020-04-08 DIAGNOSIS — Z6821 Body mass index (BMI) 21.0-21.9, adult: Secondary | ICD-10-CM | POA: Diagnosis not present

## 2020-04-08 DIAGNOSIS — F17220 Nicotine dependence, chewing tobacco, uncomplicated: Secondary | ICD-10-CM | POA: Diagnosis not present

## 2020-04-08 DIAGNOSIS — M4802 Spinal stenosis, cervical region: Secondary | ICD-10-CM | POA: Diagnosis not present

## 2020-04-08 DIAGNOSIS — C61 Malignant neoplasm of prostate: Secondary | ICD-10-CM | POA: Diagnosis not present

## 2020-04-08 DIAGNOSIS — F410 Panic disorder [episodic paroxysmal anxiety] without agoraphobia: Secondary | ICD-10-CM | POA: Diagnosis not present

## 2020-04-09 ENCOUNTER — Other Ambulatory Visit: Payer: Self-pay

## 2020-04-09 DIAGNOSIS — R5382 Chronic fatigue, unspecified: Secondary | ICD-10-CM | POA: Diagnosis not present

## 2020-04-09 DIAGNOSIS — Z6821 Body mass index (BMI) 21.0-21.9, adult: Secondary | ICD-10-CM | POA: Diagnosis not present

## 2020-04-09 DIAGNOSIS — M4802 Spinal stenosis, cervical region: Secondary | ICD-10-CM | POA: Diagnosis not present

## 2020-04-09 DIAGNOSIS — F410 Panic disorder [episodic paroxysmal anxiety] without agoraphobia: Secondary | ICD-10-CM | POA: Diagnosis not present

## 2020-04-09 DIAGNOSIS — Z87891 Personal history of nicotine dependence: Secondary | ICD-10-CM | POA: Diagnosis not present

## 2020-04-09 DIAGNOSIS — C61 Malignant neoplasm of prostate: Secondary | ICD-10-CM | POA: Diagnosis not present

## 2020-04-09 DIAGNOSIS — Z9079 Acquired absence of other genital organ(s): Secondary | ICD-10-CM | POA: Diagnosis not present

## 2020-04-09 DIAGNOSIS — F17220 Nicotine dependence, chewing tobacco, uncomplicated: Secondary | ICD-10-CM | POA: Diagnosis not present

## 2020-04-09 DIAGNOSIS — Z741 Need for assistance with personal care: Secondary | ICD-10-CM | POA: Diagnosis not present

## 2020-04-09 MED ORDER — LORAZEPAM 0.5 MG PO TABS
ORAL_TABLET | ORAL | 0 refills | Status: DC
  Filled 2020-04-09: qty 60, 10d supply, fill #0

## 2020-04-10 DIAGNOSIS — C61 Malignant neoplasm of prostate: Secondary | ICD-10-CM | POA: Diagnosis not present

## 2020-04-10 DIAGNOSIS — R5382 Chronic fatigue, unspecified: Secondary | ICD-10-CM | POA: Diagnosis not present

## 2020-04-10 DIAGNOSIS — Z6821 Body mass index (BMI) 21.0-21.9, adult: Secondary | ICD-10-CM | POA: Diagnosis not present

## 2020-04-10 DIAGNOSIS — F410 Panic disorder [episodic paroxysmal anxiety] without agoraphobia: Secondary | ICD-10-CM | POA: Diagnosis not present

## 2020-04-10 DIAGNOSIS — Z741 Need for assistance with personal care: Secondary | ICD-10-CM | POA: Diagnosis not present

## 2020-04-10 DIAGNOSIS — Z9079 Acquired absence of other genital organ(s): Secondary | ICD-10-CM | POA: Diagnosis not present

## 2020-04-10 DIAGNOSIS — F17220 Nicotine dependence, chewing tobacco, uncomplicated: Secondary | ICD-10-CM | POA: Diagnosis not present

## 2020-04-10 DIAGNOSIS — M4802 Spinal stenosis, cervical region: Secondary | ICD-10-CM | POA: Diagnosis not present

## 2020-04-10 DIAGNOSIS — Z87891 Personal history of nicotine dependence: Secondary | ICD-10-CM | POA: Diagnosis not present

## 2020-04-11 DIAGNOSIS — Z6821 Body mass index (BMI) 21.0-21.9, adult: Secondary | ICD-10-CM | POA: Diagnosis not present

## 2020-04-11 DIAGNOSIS — F17220 Nicotine dependence, chewing tobacco, uncomplicated: Secondary | ICD-10-CM | POA: Diagnosis not present

## 2020-04-11 DIAGNOSIS — Z9079 Acquired absence of other genital organ(s): Secondary | ICD-10-CM | POA: Diagnosis not present

## 2020-04-11 DIAGNOSIS — F410 Panic disorder [episodic paroxysmal anxiety] without agoraphobia: Secondary | ICD-10-CM | POA: Diagnosis not present

## 2020-04-11 DIAGNOSIS — Z87891 Personal history of nicotine dependence: Secondary | ICD-10-CM | POA: Diagnosis not present

## 2020-04-11 DIAGNOSIS — Z741 Need for assistance with personal care: Secondary | ICD-10-CM | POA: Diagnosis not present

## 2020-04-11 DIAGNOSIS — M4802 Spinal stenosis, cervical region: Secondary | ICD-10-CM | POA: Diagnosis not present

## 2020-04-11 DIAGNOSIS — R5382 Chronic fatigue, unspecified: Secondary | ICD-10-CM | POA: Diagnosis not present

## 2020-04-11 DIAGNOSIS — C61 Malignant neoplasm of prostate: Secondary | ICD-10-CM | POA: Diagnosis not present

## 2020-04-12 DIAGNOSIS — Z9079 Acquired absence of other genital organ(s): Secondary | ICD-10-CM | POA: Diagnosis not present

## 2020-04-12 DIAGNOSIS — Z6821 Body mass index (BMI) 21.0-21.9, adult: Secondary | ICD-10-CM | POA: Diagnosis not present

## 2020-04-12 DIAGNOSIS — R5382 Chronic fatigue, unspecified: Secondary | ICD-10-CM | POA: Diagnosis not present

## 2020-04-12 DIAGNOSIS — Z87891 Personal history of nicotine dependence: Secondary | ICD-10-CM | POA: Diagnosis not present

## 2020-04-12 DIAGNOSIS — F410 Panic disorder [episodic paroxysmal anxiety] without agoraphobia: Secondary | ICD-10-CM | POA: Diagnosis not present

## 2020-04-12 DIAGNOSIS — Z741 Need for assistance with personal care: Secondary | ICD-10-CM | POA: Diagnosis not present

## 2020-04-12 DIAGNOSIS — F17220 Nicotine dependence, chewing tobacco, uncomplicated: Secondary | ICD-10-CM | POA: Diagnosis not present

## 2020-04-12 DIAGNOSIS — M4802 Spinal stenosis, cervical region: Secondary | ICD-10-CM | POA: Diagnosis not present

## 2020-04-12 DIAGNOSIS — C61 Malignant neoplasm of prostate: Secondary | ICD-10-CM | POA: Diagnosis not present

## 2020-04-13 DIAGNOSIS — M4802 Spinal stenosis, cervical region: Secondary | ICD-10-CM | POA: Diagnosis not present

## 2020-04-13 DIAGNOSIS — C61 Malignant neoplasm of prostate: Secondary | ICD-10-CM | POA: Diagnosis not present

## 2020-04-13 DIAGNOSIS — F410 Panic disorder [episodic paroxysmal anxiety] without agoraphobia: Secondary | ICD-10-CM | POA: Diagnosis not present

## 2020-04-13 DIAGNOSIS — Z87891 Personal history of nicotine dependence: Secondary | ICD-10-CM | POA: Diagnosis not present

## 2020-04-13 DIAGNOSIS — Z741 Need for assistance with personal care: Secondary | ICD-10-CM | POA: Diagnosis not present

## 2020-04-13 DIAGNOSIS — F17220 Nicotine dependence, chewing tobacco, uncomplicated: Secondary | ICD-10-CM | POA: Diagnosis not present

## 2020-04-13 DIAGNOSIS — Z6821 Body mass index (BMI) 21.0-21.9, adult: Secondary | ICD-10-CM | POA: Diagnosis not present

## 2020-04-13 DIAGNOSIS — Z9079 Acquired absence of other genital organ(s): Secondary | ICD-10-CM | POA: Diagnosis not present

## 2020-04-13 DIAGNOSIS — R5382 Chronic fatigue, unspecified: Secondary | ICD-10-CM | POA: Diagnosis not present

## 2020-04-14 DIAGNOSIS — Z9079 Acquired absence of other genital organ(s): Secondary | ICD-10-CM | POA: Diagnosis not present

## 2020-04-14 DIAGNOSIS — F410 Panic disorder [episodic paroxysmal anxiety] without agoraphobia: Secondary | ICD-10-CM | POA: Diagnosis not present

## 2020-04-14 DIAGNOSIS — F17220 Nicotine dependence, chewing tobacco, uncomplicated: Secondary | ICD-10-CM | POA: Diagnosis not present

## 2020-04-14 DIAGNOSIS — M4802 Spinal stenosis, cervical region: Secondary | ICD-10-CM | POA: Diagnosis not present

## 2020-04-14 DIAGNOSIS — R5382 Chronic fatigue, unspecified: Secondary | ICD-10-CM | POA: Diagnosis not present

## 2020-04-14 DIAGNOSIS — C61 Malignant neoplasm of prostate: Secondary | ICD-10-CM | POA: Diagnosis not present

## 2020-04-14 DIAGNOSIS — Z87891 Personal history of nicotine dependence: Secondary | ICD-10-CM | POA: Diagnosis not present

## 2020-04-14 DIAGNOSIS — Z6821 Body mass index (BMI) 21.0-21.9, adult: Secondary | ICD-10-CM | POA: Diagnosis not present

## 2020-04-14 DIAGNOSIS — Z741 Need for assistance with personal care: Secondary | ICD-10-CM | POA: Diagnosis not present

## 2020-04-15 ENCOUNTER — Other Ambulatory Visit: Payer: Self-pay | Admitting: Hospice and Palliative Medicine

## 2020-04-15 ENCOUNTER — Other Ambulatory Visit: Payer: Self-pay

## 2020-04-15 DIAGNOSIS — C7951 Secondary malignant neoplasm of bone: Secondary | ICD-10-CM

## 2020-04-15 DIAGNOSIS — Z9079 Acquired absence of other genital organ(s): Secondary | ICD-10-CM | POA: Diagnosis not present

## 2020-04-15 DIAGNOSIS — Z741 Need for assistance with personal care: Secondary | ICD-10-CM | POA: Diagnosis not present

## 2020-04-15 DIAGNOSIS — Z6821 Body mass index (BMI) 21.0-21.9, adult: Secondary | ICD-10-CM | POA: Diagnosis not present

## 2020-04-15 DIAGNOSIS — F410 Panic disorder [episodic paroxysmal anxiety] without agoraphobia: Secondary | ICD-10-CM | POA: Diagnosis not present

## 2020-04-15 DIAGNOSIS — F17220 Nicotine dependence, chewing tobacco, uncomplicated: Secondary | ICD-10-CM | POA: Diagnosis not present

## 2020-04-15 DIAGNOSIS — C61 Malignant neoplasm of prostate: Secondary | ICD-10-CM

## 2020-04-15 DIAGNOSIS — R5382 Chronic fatigue, unspecified: Secondary | ICD-10-CM | POA: Diagnosis not present

## 2020-04-15 DIAGNOSIS — G893 Neoplasm related pain (acute) (chronic): Secondary | ICD-10-CM

## 2020-04-15 DIAGNOSIS — M4802 Spinal stenosis, cervical region: Secondary | ICD-10-CM | POA: Diagnosis not present

## 2020-04-15 DIAGNOSIS — Z87891 Personal history of nicotine dependence: Secondary | ICD-10-CM | POA: Diagnosis not present

## 2020-04-15 MED ORDER — FENTANYL 50 MCG/HR TD PT72
MEDICATED_PATCH | TRANSDERMAL | 0 refills | Status: DC
  Filled 2020-04-15: qty 15, 30d supply, fill #0

## 2020-04-15 MED ORDER — FENTANYL 100 MCG/HR TD PT72
MEDICATED_PATCH | TRANSDERMAL | 0 refills | Status: DC
  Filled 2020-04-15: qty 15, 30d supply, fill #0

## 2020-04-15 NOTE — Progress Notes (Signed)
Refill requested for fentanyl patches by hospice nurse. Rx sent to pharmacy. PDMP reviewed.

## 2020-04-16 DIAGNOSIS — Z741 Need for assistance with personal care: Secondary | ICD-10-CM | POA: Diagnosis not present

## 2020-04-16 DIAGNOSIS — Z87891 Personal history of nicotine dependence: Secondary | ICD-10-CM | POA: Diagnosis not present

## 2020-04-16 DIAGNOSIS — M4802 Spinal stenosis, cervical region: Secondary | ICD-10-CM | POA: Diagnosis not present

## 2020-04-16 DIAGNOSIS — R5382 Chronic fatigue, unspecified: Secondary | ICD-10-CM | POA: Diagnosis not present

## 2020-04-16 DIAGNOSIS — Z9079 Acquired absence of other genital organ(s): Secondary | ICD-10-CM | POA: Diagnosis not present

## 2020-04-16 DIAGNOSIS — Z6821 Body mass index (BMI) 21.0-21.9, adult: Secondary | ICD-10-CM | POA: Diagnosis not present

## 2020-04-16 DIAGNOSIS — F410 Panic disorder [episodic paroxysmal anxiety] without agoraphobia: Secondary | ICD-10-CM | POA: Diagnosis not present

## 2020-04-16 DIAGNOSIS — F17220 Nicotine dependence, chewing tobacco, uncomplicated: Secondary | ICD-10-CM | POA: Diagnosis not present

## 2020-04-16 DIAGNOSIS — C61 Malignant neoplasm of prostate: Secondary | ICD-10-CM | POA: Diagnosis not present

## 2020-04-17 DIAGNOSIS — F17220 Nicotine dependence, chewing tobacco, uncomplicated: Secondary | ICD-10-CM | POA: Diagnosis not present

## 2020-04-17 DIAGNOSIS — Z9079 Acquired absence of other genital organ(s): Secondary | ICD-10-CM | POA: Diagnosis not present

## 2020-04-17 DIAGNOSIS — F410 Panic disorder [episodic paroxysmal anxiety] without agoraphobia: Secondary | ICD-10-CM | POA: Diagnosis not present

## 2020-04-17 DIAGNOSIS — R5382 Chronic fatigue, unspecified: Secondary | ICD-10-CM | POA: Diagnosis not present

## 2020-04-17 DIAGNOSIS — Z741 Need for assistance with personal care: Secondary | ICD-10-CM | POA: Diagnosis not present

## 2020-04-17 DIAGNOSIS — Z6821 Body mass index (BMI) 21.0-21.9, adult: Secondary | ICD-10-CM | POA: Diagnosis not present

## 2020-04-17 DIAGNOSIS — Z87891 Personal history of nicotine dependence: Secondary | ICD-10-CM | POA: Diagnosis not present

## 2020-04-17 DIAGNOSIS — C61 Malignant neoplasm of prostate: Secondary | ICD-10-CM | POA: Diagnosis not present

## 2020-04-17 DIAGNOSIS — M4802 Spinal stenosis, cervical region: Secondary | ICD-10-CM | POA: Diagnosis not present

## 2020-04-18 DIAGNOSIS — M4802 Spinal stenosis, cervical region: Secondary | ICD-10-CM | POA: Diagnosis not present

## 2020-04-18 DIAGNOSIS — Z87891 Personal history of nicotine dependence: Secondary | ICD-10-CM | POA: Diagnosis not present

## 2020-04-18 DIAGNOSIS — Z741 Need for assistance with personal care: Secondary | ICD-10-CM | POA: Diagnosis not present

## 2020-04-18 DIAGNOSIS — R5382 Chronic fatigue, unspecified: Secondary | ICD-10-CM | POA: Diagnosis not present

## 2020-04-18 DIAGNOSIS — F410 Panic disorder [episodic paroxysmal anxiety] without agoraphobia: Secondary | ICD-10-CM | POA: Diagnosis not present

## 2020-04-18 DIAGNOSIS — C61 Malignant neoplasm of prostate: Secondary | ICD-10-CM | POA: Diagnosis not present

## 2020-04-18 DIAGNOSIS — Z6821 Body mass index (BMI) 21.0-21.9, adult: Secondary | ICD-10-CM | POA: Diagnosis not present

## 2020-04-18 DIAGNOSIS — Z9079 Acquired absence of other genital organ(s): Secondary | ICD-10-CM | POA: Diagnosis not present

## 2020-04-18 DIAGNOSIS — F17220 Nicotine dependence, chewing tobacco, uncomplicated: Secondary | ICD-10-CM | POA: Diagnosis not present

## 2020-04-19 DIAGNOSIS — R5382 Chronic fatigue, unspecified: Secondary | ICD-10-CM | POA: Diagnosis not present

## 2020-04-19 DIAGNOSIS — Z741 Need for assistance with personal care: Secondary | ICD-10-CM | POA: Diagnosis not present

## 2020-04-19 DIAGNOSIS — Z6821 Body mass index (BMI) 21.0-21.9, adult: Secondary | ICD-10-CM | POA: Diagnosis not present

## 2020-04-19 DIAGNOSIS — C61 Malignant neoplasm of prostate: Secondary | ICD-10-CM | POA: Diagnosis not present

## 2020-04-19 DIAGNOSIS — Z87891 Personal history of nicotine dependence: Secondary | ICD-10-CM | POA: Diagnosis not present

## 2020-04-19 DIAGNOSIS — M4802 Spinal stenosis, cervical region: Secondary | ICD-10-CM | POA: Diagnosis not present

## 2020-04-19 DIAGNOSIS — Z9079 Acquired absence of other genital organ(s): Secondary | ICD-10-CM | POA: Diagnosis not present

## 2020-04-19 DIAGNOSIS — F410 Panic disorder [episodic paroxysmal anxiety] without agoraphobia: Secondary | ICD-10-CM | POA: Diagnosis not present

## 2020-04-19 DIAGNOSIS — F17220 Nicotine dependence, chewing tobacco, uncomplicated: Secondary | ICD-10-CM | POA: Diagnosis not present

## 2020-04-20 DIAGNOSIS — Z741 Need for assistance with personal care: Secondary | ICD-10-CM | POA: Diagnosis not present

## 2020-04-20 DIAGNOSIS — R5382 Chronic fatigue, unspecified: Secondary | ICD-10-CM | POA: Diagnosis not present

## 2020-04-20 DIAGNOSIS — M4802 Spinal stenosis, cervical region: Secondary | ICD-10-CM | POA: Diagnosis not present

## 2020-04-20 DIAGNOSIS — C61 Malignant neoplasm of prostate: Secondary | ICD-10-CM | POA: Diagnosis not present

## 2020-04-20 DIAGNOSIS — Z87891 Personal history of nicotine dependence: Secondary | ICD-10-CM | POA: Diagnosis not present

## 2020-04-20 DIAGNOSIS — Z9079 Acquired absence of other genital organ(s): Secondary | ICD-10-CM | POA: Diagnosis not present

## 2020-04-20 DIAGNOSIS — F410 Panic disorder [episodic paroxysmal anxiety] without agoraphobia: Secondary | ICD-10-CM | POA: Diagnosis not present

## 2020-04-20 DIAGNOSIS — F17220 Nicotine dependence, chewing tobacco, uncomplicated: Secondary | ICD-10-CM | POA: Diagnosis not present

## 2020-04-20 DIAGNOSIS — Z6821 Body mass index (BMI) 21.0-21.9, adult: Secondary | ICD-10-CM | POA: Diagnosis not present

## 2020-04-21 DIAGNOSIS — R5382 Chronic fatigue, unspecified: Secondary | ICD-10-CM | POA: Diagnosis not present

## 2020-04-21 DIAGNOSIS — F17220 Nicotine dependence, chewing tobacco, uncomplicated: Secondary | ICD-10-CM | POA: Diagnosis not present

## 2020-04-21 DIAGNOSIS — Z9079 Acquired absence of other genital organ(s): Secondary | ICD-10-CM | POA: Diagnosis not present

## 2020-04-21 DIAGNOSIS — Z6821 Body mass index (BMI) 21.0-21.9, adult: Secondary | ICD-10-CM | POA: Diagnosis not present

## 2020-04-21 DIAGNOSIS — F410 Panic disorder [episodic paroxysmal anxiety] without agoraphobia: Secondary | ICD-10-CM | POA: Diagnosis not present

## 2020-04-21 DIAGNOSIS — Z87891 Personal history of nicotine dependence: Secondary | ICD-10-CM | POA: Diagnosis not present

## 2020-04-21 DIAGNOSIS — M4802 Spinal stenosis, cervical region: Secondary | ICD-10-CM | POA: Diagnosis not present

## 2020-04-21 DIAGNOSIS — Z741 Need for assistance with personal care: Secondary | ICD-10-CM | POA: Diagnosis not present

## 2020-04-21 DIAGNOSIS — C61 Malignant neoplasm of prostate: Secondary | ICD-10-CM | POA: Diagnosis not present

## 2020-04-22 DIAGNOSIS — Z741 Need for assistance with personal care: Secondary | ICD-10-CM | POA: Diagnosis not present

## 2020-04-22 DIAGNOSIS — C61 Malignant neoplasm of prostate: Secondary | ICD-10-CM | POA: Diagnosis not present

## 2020-04-22 DIAGNOSIS — M4802 Spinal stenosis, cervical region: Secondary | ICD-10-CM | POA: Diagnosis not present

## 2020-04-22 DIAGNOSIS — Z87891 Personal history of nicotine dependence: Secondary | ICD-10-CM | POA: Diagnosis not present

## 2020-04-22 DIAGNOSIS — F17220 Nicotine dependence, chewing tobacco, uncomplicated: Secondary | ICD-10-CM | POA: Diagnosis not present

## 2020-04-22 DIAGNOSIS — Z9079 Acquired absence of other genital organ(s): Secondary | ICD-10-CM | POA: Diagnosis not present

## 2020-04-22 DIAGNOSIS — R5382 Chronic fatigue, unspecified: Secondary | ICD-10-CM | POA: Diagnosis not present

## 2020-04-22 DIAGNOSIS — Z6821 Body mass index (BMI) 21.0-21.9, adult: Secondary | ICD-10-CM | POA: Diagnosis not present

## 2020-04-22 DIAGNOSIS — F410 Panic disorder [episodic paroxysmal anxiety] without agoraphobia: Secondary | ICD-10-CM | POA: Diagnosis not present

## 2020-04-23 ENCOUNTER — Other Ambulatory Visit: Payer: Self-pay | Admitting: Hospice and Palliative Medicine

## 2020-04-23 DIAGNOSIS — Z87891 Personal history of nicotine dependence: Secondary | ICD-10-CM | POA: Diagnosis not present

## 2020-04-23 DIAGNOSIS — Z6821 Body mass index (BMI) 21.0-21.9, adult: Secondary | ICD-10-CM | POA: Diagnosis not present

## 2020-04-23 DIAGNOSIS — R5382 Chronic fatigue, unspecified: Secondary | ICD-10-CM | POA: Diagnosis not present

## 2020-04-23 DIAGNOSIS — M4802 Spinal stenosis, cervical region: Secondary | ICD-10-CM | POA: Diagnosis not present

## 2020-04-23 DIAGNOSIS — Z9079 Acquired absence of other genital organ(s): Secondary | ICD-10-CM | POA: Diagnosis not present

## 2020-04-23 DIAGNOSIS — F17220 Nicotine dependence, chewing tobacco, uncomplicated: Secondary | ICD-10-CM | POA: Diagnosis not present

## 2020-04-23 DIAGNOSIS — C61 Malignant neoplasm of prostate: Secondary | ICD-10-CM | POA: Diagnosis not present

## 2020-04-23 DIAGNOSIS — Z741 Need for assistance with personal care: Secondary | ICD-10-CM | POA: Diagnosis not present

## 2020-04-23 DIAGNOSIS — F410 Panic disorder [episodic paroxysmal anxiety] without agoraphobia: Secondary | ICD-10-CM | POA: Diagnosis not present

## 2020-04-24 DIAGNOSIS — R5382 Chronic fatigue, unspecified: Secondary | ICD-10-CM | POA: Diagnosis not present

## 2020-04-24 DIAGNOSIS — Z6821 Body mass index (BMI) 21.0-21.9, adult: Secondary | ICD-10-CM | POA: Diagnosis not present

## 2020-04-24 DIAGNOSIS — M4802 Spinal stenosis, cervical region: Secondary | ICD-10-CM | POA: Diagnosis not present

## 2020-04-24 DIAGNOSIS — Z9079 Acquired absence of other genital organ(s): Secondary | ICD-10-CM | POA: Diagnosis not present

## 2020-04-24 DIAGNOSIS — Z741 Need for assistance with personal care: Secondary | ICD-10-CM | POA: Diagnosis not present

## 2020-04-24 DIAGNOSIS — F410 Panic disorder [episodic paroxysmal anxiety] without agoraphobia: Secondary | ICD-10-CM | POA: Diagnosis not present

## 2020-04-24 DIAGNOSIS — C61 Malignant neoplasm of prostate: Secondary | ICD-10-CM | POA: Diagnosis not present

## 2020-04-24 DIAGNOSIS — F17220 Nicotine dependence, chewing tobacco, uncomplicated: Secondary | ICD-10-CM | POA: Diagnosis not present

## 2020-04-24 DIAGNOSIS — Z87891 Personal history of nicotine dependence: Secondary | ICD-10-CM | POA: Diagnosis not present

## 2020-04-25 DIAGNOSIS — Z9079 Acquired absence of other genital organ(s): Secondary | ICD-10-CM | POA: Diagnosis not present

## 2020-04-25 DIAGNOSIS — C61 Malignant neoplasm of prostate: Secondary | ICD-10-CM | POA: Diagnosis not present

## 2020-04-25 DIAGNOSIS — Z6821 Body mass index (BMI) 21.0-21.9, adult: Secondary | ICD-10-CM | POA: Diagnosis not present

## 2020-04-25 DIAGNOSIS — Z741 Need for assistance with personal care: Secondary | ICD-10-CM | POA: Diagnosis not present

## 2020-04-25 DIAGNOSIS — Z87891 Personal history of nicotine dependence: Secondary | ICD-10-CM | POA: Diagnosis not present

## 2020-04-25 DIAGNOSIS — R5382 Chronic fatigue, unspecified: Secondary | ICD-10-CM | POA: Diagnosis not present

## 2020-04-25 DIAGNOSIS — F410 Panic disorder [episodic paroxysmal anxiety] without agoraphobia: Secondary | ICD-10-CM | POA: Diagnosis not present

## 2020-04-25 DIAGNOSIS — M4802 Spinal stenosis, cervical region: Secondary | ICD-10-CM | POA: Diagnosis not present

## 2020-04-25 DIAGNOSIS — F17220 Nicotine dependence, chewing tobacco, uncomplicated: Secondary | ICD-10-CM | POA: Diagnosis not present

## 2020-04-26 ENCOUNTER — Other Ambulatory Visit: Payer: Self-pay

## 2020-04-26 DIAGNOSIS — Z741 Need for assistance with personal care: Secondary | ICD-10-CM | POA: Diagnosis not present

## 2020-04-26 DIAGNOSIS — Z87891 Personal history of nicotine dependence: Secondary | ICD-10-CM | POA: Diagnosis not present

## 2020-04-26 DIAGNOSIS — Z6821 Body mass index (BMI) 21.0-21.9, adult: Secondary | ICD-10-CM | POA: Diagnosis not present

## 2020-04-26 DIAGNOSIS — F410 Panic disorder [episodic paroxysmal anxiety] without agoraphobia: Secondary | ICD-10-CM | POA: Diagnosis not present

## 2020-04-26 DIAGNOSIS — M4802 Spinal stenosis, cervical region: Secondary | ICD-10-CM | POA: Diagnosis not present

## 2020-04-26 DIAGNOSIS — F17220 Nicotine dependence, chewing tobacco, uncomplicated: Secondary | ICD-10-CM | POA: Diagnosis not present

## 2020-04-26 DIAGNOSIS — C61 Malignant neoplasm of prostate: Secondary | ICD-10-CM | POA: Diagnosis not present

## 2020-04-26 DIAGNOSIS — R5382 Chronic fatigue, unspecified: Secondary | ICD-10-CM | POA: Diagnosis not present

## 2020-04-26 DIAGNOSIS — Z9079 Acquired absence of other genital organ(s): Secondary | ICD-10-CM | POA: Diagnosis not present

## 2020-04-27 ENCOUNTER — Other Ambulatory Visit: Payer: Self-pay

## 2020-04-27 DIAGNOSIS — F17220 Nicotine dependence, chewing tobacco, uncomplicated: Secondary | ICD-10-CM | POA: Diagnosis not present

## 2020-04-27 DIAGNOSIS — C61 Malignant neoplasm of prostate: Secondary | ICD-10-CM | POA: Diagnosis not present

## 2020-04-27 DIAGNOSIS — Z6821 Body mass index (BMI) 21.0-21.9, adult: Secondary | ICD-10-CM | POA: Diagnosis not present

## 2020-04-27 DIAGNOSIS — M4802 Spinal stenosis, cervical region: Secondary | ICD-10-CM | POA: Diagnosis not present

## 2020-04-27 DIAGNOSIS — F410 Panic disorder [episodic paroxysmal anxiety] without agoraphobia: Secondary | ICD-10-CM | POA: Diagnosis not present

## 2020-04-27 DIAGNOSIS — Z741 Need for assistance with personal care: Secondary | ICD-10-CM | POA: Diagnosis not present

## 2020-04-27 DIAGNOSIS — Z87891 Personal history of nicotine dependence: Secondary | ICD-10-CM | POA: Diagnosis not present

## 2020-04-27 DIAGNOSIS — R5382 Chronic fatigue, unspecified: Secondary | ICD-10-CM | POA: Diagnosis not present

## 2020-04-27 DIAGNOSIS — Z9079 Acquired absence of other genital organ(s): Secondary | ICD-10-CM | POA: Diagnosis not present

## 2020-04-27 MED ORDER — OXYCODONE HCL 15 MG PO TABS
ORAL_TABLET | ORAL | 0 refills | Status: DC
  Filled 2020-04-27: qty 90, 15d supply, fill #0

## 2020-04-28 DIAGNOSIS — Z741 Need for assistance with personal care: Secondary | ICD-10-CM | POA: Diagnosis not present

## 2020-04-28 DIAGNOSIS — M4802 Spinal stenosis, cervical region: Secondary | ICD-10-CM | POA: Diagnosis not present

## 2020-04-28 DIAGNOSIS — F410 Panic disorder [episodic paroxysmal anxiety] without agoraphobia: Secondary | ICD-10-CM | POA: Diagnosis not present

## 2020-04-28 DIAGNOSIS — C61 Malignant neoplasm of prostate: Secondary | ICD-10-CM | POA: Diagnosis not present

## 2020-04-28 DIAGNOSIS — R5382 Chronic fatigue, unspecified: Secondary | ICD-10-CM | POA: Diagnosis not present

## 2020-04-28 DIAGNOSIS — Z9079 Acquired absence of other genital organ(s): Secondary | ICD-10-CM | POA: Diagnosis not present

## 2020-04-28 DIAGNOSIS — Z87891 Personal history of nicotine dependence: Secondary | ICD-10-CM | POA: Diagnosis not present

## 2020-04-28 DIAGNOSIS — Z6821 Body mass index (BMI) 21.0-21.9, adult: Secondary | ICD-10-CM | POA: Diagnosis not present

## 2020-04-28 DIAGNOSIS — F17220 Nicotine dependence, chewing tobacco, uncomplicated: Secondary | ICD-10-CM | POA: Diagnosis not present

## 2020-04-29 DIAGNOSIS — Z87891 Personal history of nicotine dependence: Secondary | ICD-10-CM | POA: Diagnosis not present

## 2020-04-29 DIAGNOSIS — M4802 Spinal stenosis, cervical region: Secondary | ICD-10-CM | POA: Diagnosis not present

## 2020-04-29 DIAGNOSIS — C61 Malignant neoplasm of prostate: Secondary | ICD-10-CM | POA: Diagnosis not present

## 2020-04-29 DIAGNOSIS — F410 Panic disorder [episodic paroxysmal anxiety] without agoraphobia: Secondary | ICD-10-CM | POA: Diagnosis not present

## 2020-04-29 DIAGNOSIS — F17220 Nicotine dependence, chewing tobacco, uncomplicated: Secondary | ICD-10-CM | POA: Diagnosis not present

## 2020-04-29 DIAGNOSIS — Z6821 Body mass index (BMI) 21.0-21.9, adult: Secondary | ICD-10-CM | POA: Diagnosis not present

## 2020-04-29 DIAGNOSIS — Z9079 Acquired absence of other genital organ(s): Secondary | ICD-10-CM | POA: Diagnosis not present

## 2020-04-29 DIAGNOSIS — R5382 Chronic fatigue, unspecified: Secondary | ICD-10-CM | POA: Diagnosis not present

## 2020-04-29 DIAGNOSIS — Z741 Need for assistance with personal care: Secondary | ICD-10-CM | POA: Diagnosis not present

## 2020-04-30 ENCOUNTER — Other Ambulatory Visit: Payer: Self-pay | Admitting: Hospice and Palliative Medicine

## 2020-04-30 ENCOUNTER — Other Ambulatory Visit: Payer: Self-pay

## 2020-04-30 DIAGNOSIS — Z6821 Body mass index (BMI) 21.0-21.9, adult: Secondary | ICD-10-CM | POA: Diagnosis not present

## 2020-04-30 DIAGNOSIS — Z9079 Acquired absence of other genital organ(s): Secondary | ICD-10-CM | POA: Diagnosis not present

## 2020-04-30 DIAGNOSIS — F17220 Nicotine dependence, chewing tobacco, uncomplicated: Secondary | ICD-10-CM | POA: Diagnosis not present

## 2020-04-30 DIAGNOSIS — M4802 Spinal stenosis, cervical region: Secondary | ICD-10-CM | POA: Diagnosis not present

## 2020-04-30 DIAGNOSIS — C61 Malignant neoplasm of prostate: Secondary | ICD-10-CM | POA: Diagnosis not present

## 2020-04-30 DIAGNOSIS — R5382 Chronic fatigue, unspecified: Secondary | ICD-10-CM | POA: Diagnosis not present

## 2020-04-30 DIAGNOSIS — Z87891 Personal history of nicotine dependence: Secondary | ICD-10-CM | POA: Diagnosis not present

## 2020-04-30 DIAGNOSIS — F410 Panic disorder [episodic paroxysmal anxiety] without agoraphobia: Secondary | ICD-10-CM | POA: Diagnosis not present

## 2020-04-30 DIAGNOSIS — Z741 Need for assistance with personal care: Secondary | ICD-10-CM | POA: Diagnosis not present

## 2020-04-30 MED ORDER — LORAZEPAM 0.5 MG PO TABS
ORAL_TABLET | ORAL | 0 refills | Status: DC
  Filled 2020-04-30: qty 60, 10d supply, fill #0

## 2020-04-30 MED ORDER — DEXAMETHASONE 4 MG PO TABS
ORAL_TABLET | Freq: Two times a day (BID) | ORAL | 1 refills | Status: DC
  Filled 2020-04-30: qty 30, 15d supply, fill #0
  Filled 2020-05-23: qty 30, 15d supply, fill #1

## 2020-04-30 MED FILL — Metoprolol Tartrate Tab 25 MG: ORAL | 30 days supply | Qty: 60 | Fill #0 | Status: AC

## 2020-05-01 DIAGNOSIS — C61 Malignant neoplasm of prostate: Secondary | ICD-10-CM | POA: Diagnosis not present

## 2020-05-01 DIAGNOSIS — M4802 Spinal stenosis, cervical region: Secondary | ICD-10-CM | POA: Diagnosis not present

## 2020-05-01 DIAGNOSIS — Z741 Need for assistance with personal care: Secondary | ICD-10-CM | POA: Diagnosis not present

## 2020-05-01 DIAGNOSIS — Z87891 Personal history of nicotine dependence: Secondary | ICD-10-CM | POA: Diagnosis not present

## 2020-05-01 DIAGNOSIS — Z9079 Acquired absence of other genital organ(s): Secondary | ICD-10-CM | POA: Diagnosis not present

## 2020-05-01 DIAGNOSIS — F17220 Nicotine dependence, chewing tobacco, uncomplicated: Secondary | ICD-10-CM | POA: Diagnosis not present

## 2020-05-01 DIAGNOSIS — R5382 Chronic fatigue, unspecified: Secondary | ICD-10-CM | POA: Diagnosis not present

## 2020-05-01 DIAGNOSIS — F410 Panic disorder [episodic paroxysmal anxiety] without agoraphobia: Secondary | ICD-10-CM | POA: Diagnosis not present

## 2020-05-01 DIAGNOSIS — Z6821 Body mass index (BMI) 21.0-21.9, adult: Secondary | ICD-10-CM | POA: Diagnosis not present

## 2020-05-02 DIAGNOSIS — M4802 Spinal stenosis, cervical region: Secondary | ICD-10-CM | POA: Diagnosis not present

## 2020-05-02 DIAGNOSIS — Z9079 Acquired absence of other genital organ(s): Secondary | ICD-10-CM | POA: Diagnosis not present

## 2020-05-02 DIAGNOSIS — F17220 Nicotine dependence, chewing tobacco, uncomplicated: Secondary | ICD-10-CM | POA: Diagnosis not present

## 2020-05-02 DIAGNOSIS — R220 Localized swelling, mass and lump, head: Secondary | ICD-10-CM | POA: Diagnosis not present

## 2020-05-02 DIAGNOSIS — Z741 Need for assistance with personal care: Secondary | ICD-10-CM | POA: Diagnosis not present

## 2020-05-02 DIAGNOSIS — C61 Malignant neoplasm of prostate: Secondary | ICD-10-CM | POA: Diagnosis not present

## 2020-05-02 DIAGNOSIS — Z87891 Personal history of nicotine dependence: Secondary | ICD-10-CM | POA: Diagnosis not present

## 2020-05-02 DIAGNOSIS — R5382 Chronic fatigue, unspecified: Secondary | ICD-10-CM | POA: Diagnosis not present

## 2020-05-02 DIAGNOSIS — F410 Panic disorder [episodic paroxysmal anxiety] without agoraphobia: Secondary | ICD-10-CM | POA: Diagnosis not present

## 2020-05-03 DIAGNOSIS — F410 Panic disorder [episodic paroxysmal anxiety] without agoraphobia: Secondary | ICD-10-CM | POA: Diagnosis not present

## 2020-05-03 DIAGNOSIS — Z87891 Personal history of nicotine dependence: Secondary | ICD-10-CM | POA: Diagnosis not present

## 2020-05-03 DIAGNOSIS — R5382 Chronic fatigue, unspecified: Secondary | ICD-10-CM | POA: Diagnosis not present

## 2020-05-03 DIAGNOSIS — R220 Localized swelling, mass and lump, head: Secondary | ICD-10-CM | POA: Diagnosis not present

## 2020-05-03 DIAGNOSIS — Z741 Need for assistance with personal care: Secondary | ICD-10-CM | POA: Diagnosis not present

## 2020-05-03 DIAGNOSIS — C61 Malignant neoplasm of prostate: Secondary | ICD-10-CM | POA: Diagnosis not present

## 2020-05-03 DIAGNOSIS — Z9079 Acquired absence of other genital organ(s): Secondary | ICD-10-CM | POA: Diagnosis not present

## 2020-05-03 DIAGNOSIS — M4802 Spinal stenosis, cervical region: Secondary | ICD-10-CM | POA: Diagnosis not present

## 2020-05-03 DIAGNOSIS — F17220 Nicotine dependence, chewing tobacco, uncomplicated: Secondary | ICD-10-CM | POA: Diagnosis not present

## 2020-05-04 DIAGNOSIS — F17220 Nicotine dependence, chewing tobacco, uncomplicated: Secondary | ICD-10-CM | POA: Diagnosis not present

## 2020-05-04 DIAGNOSIS — R220 Localized swelling, mass and lump, head: Secondary | ICD-10-CM | POA: Diagnosis not present

## 2020-05-04 DIAGNOSIS — F410 Panic disorder [episodic paroxysmal anxiety] without agoraphobia: Secondary | ICD-10-CM | POA: Diagnosis not present

## 2020-05-04 DIAGNOSIS — M4802 Spinal stenosis, cervical region: Secondary | ICD-10-CM | POA: Diagnosis not present

## 2020-05-04 DIAGNOSIS — Z9079 Acquired absence of other genital organ(s): Secondary | ICD-10-CM | POA: Diagnosis not present

## 2020-05-04 DIAGNOSIS — Z87891 Personal history of nicotine dependence: Secondary | ICD-10-CM | POA: Diagnosis not present

## 2020-05-04 DIAGNOSIS — Z741 Need for assistance with personal care: Secondary | ICD-10-CM | POA: Diagnosis not present

## 2020-05-04 DIAGNOSIS — R5382 Chronic fatigue, unspecified: Secondary | ICD-10-CM | POA: Diagnosis not present

## 2020-05-04 DIAGNOSIS — C61 Malignant neoplasm of prostate: Secondary | ICD-10-CM | POA: Diagnosis not present

## 2020-05-05 DIAGNOSIS — R5382 Chronic fatigue, unspecified: Secondary | ICD-10-CM | POA: Diagnosis not present

## 2020-05-05 DIAGNOSIS — Z741 Need for assistance with personal care: Secondary | ICD-10-CM | POA: Diagnosis not present

## 2020-05-05 DIAGNOSIS — Z9079 Acquired absence of other genital organ(s): Secondary | ICD-10-CM | POA: Diagnosis not present

## 2020-05-05 DIAGNOSIS — C61 Malignant neoplasm of prostate: Secondary | ICD-10-CM | POA: Diagnosis not present

## 2020-05-05 DIAGNOSIS — F17220 Nicotine dependence, chewing tobacco, uncomplicated: Secondary | ICD-10-CM | POA: Diagnosis not present

## 2020-05-05 DIAGNOSIS — Z87891 Personal history of nicotine dependence: Secondary | ICD-10-CM | POA: Diagnosis not present

## 2020-05-05 DIAGNOSIS — F410 Panic disorder [episodic paroxysmal anxiety] without agoraphobia: Secondary | ICD-10-CM | POA: Diagnosis not present

## 2020-05-05 DIAGNOSIS — M4802 Spinal stenosis, cervical region: Secondary | ICD-10-CM | POA: Diagnosis not present

## 2020-05-05 DIAGNOSIS — R220 Localized swelling, mass and lump, head: Secondary | ICD-10-CM | POA: Diagnosis not present

## 2020-05-06 DIAGNOSIS — R5382 Chronic fatigue, unspecified: Secondary | ICD-10-CM | POA: Diagnosis not present

## 2020-05-06 DIAGNOSIS — M4802 Spinal stenosis, cervical region: Secondary | ICD-10-CM | POA: Diagnosis not present

## 2020-05-06 DIAGNOSIS — F410 Panic disorder [episodic paroxysmal anxiety] without agoraphobia: Secondary | ICD-10-CM | POA: Diagnosis not present

## 2020-05-06 DIAGNOSIS — Z87891 Personal history of nicotine dependence: Secondary | ICD-10-CM | POA: Diagnosis not present

## 2020-05-06 DIAGNOSIS — C61 Malignant neoplasm of prostate: Secondary | ICD-10-CM | POA: Diagnosis not present

## 2020-05-06 DIAGNOSIS — F17220 Nicotine dependence, chewing tobacco, uncomplicated: Secondary | ICD-10-CM | POA: Diagnosis not present

## 2020-05-06 DIAGNOSIS — Z9079 Acquired absence of other genital organ(s): Secondary | ICD-10-CM | POA: Diagnosis not present

## 2020-05-06 DIAGNOSIS — Z741 Need for assistance with personal care: Secondary | ICD-10-CM | POA: Diagnosis not present

## 2020-05-06 DIAGNOSIS — R220 Localized swelling, mass and lump, head: Secondary | ICD-10-CM | POA: Diagnosis not present

## 2020-05-07 DIAGNOSIS — C61 Malignant neoplasm of prostate: Secondary | ICD-10-CM | POA: Diagnosis not present

## 2020-05-07 DIAGNOSIS — F17220 Nicotine dependence, chewing tobacco, uncomplicated: Secondary | ICD-10-CM | POA: Diagnosis not present

## 2020-05-07 DIAGNOSIS — R5382 Chronic fatigue, unspecified: Secondary | ICD-10-CM | POA: Diagnosis not present

## 2020-05-07 DIAGNOSIS — M4802 Spinal stenosis, cervical region: Secondary | ICD-10-CM | POA: Diagnosis not present

## 2020-05-07 DIAGNOSIS — F410 Panic disorder [episodic paroxysmal anxiety] without agoraphobia: Secondary | ICD-10-CM | POA: Diagnosis not present

## 2020-05-07 DIAGNOSIS — Z9079 Acquired absence of other genital organ(s): Secondary | ICD-10-CM | POA: Diagnosis not present

## 2020-05-07 DIAGNOSIS — Z741 Need for assistance with personal care: Secondary | ICD-10-CM | POA: Diagnosis not present

## 2020-05-07 DIAGNOSIS — R220 Localized swelling, mass and lump, head: Secondary | ICD-10-CM | POA: Diagnosis not present

## 2020-05-07 DIAGNOSIS — Z87891 Personal history of nicotine dependence: Secondary | ICD-10-CM | POA: Diagnosis not present

## 2020-05-08 DIAGNOSIS — C61 Malignant neoplasm of prostate: Secondary | ICD-10-CM | POA: Diagnosis not present

## 2020-05-08 DIAGNOSIS — Z9079 Acquired absence of other genital organ(s): Secondary | ICD-10-CM | POA: Diagnosis not present

## 2020-05-08 DIAGNOSIS — R220 Localized swelling, mass and lump, head: Secondary | ICD-10-CM | POA: Diagnosis not present

## 2020-05-08 DIAGNOSIS — Z87891 Personal history of nicotine dependence: Secondary | ICD-10-CM | POA: Diagnosis not present

## 2020-05-08 DIAGNOSIS — F17220 Nicotine dependence, chewing tobacco, uncomplicated: Secondary | ICD-10-CM | POA: Diagnosis not present

## 2020-05-08 DIAGNOSIS — M4802 Spinal stenosis, cervical region: Secondary | ICD-10-CM | POA: Diagnosis not present

## 2020-05-08 DIAGNOSIS — F410 Panic disorder [episodic paroxysmal anxiety] without agoraphobia: Secondary | ICD-10-CM | POA: Diagnosis not present

## 2020-05-08 DIAGNOSIS — Z741 Need for assistance with personal care: Secondary | ICD-10-CM | POA: Diagnosis not present

## 2020-05-08 DIAGNOSIS — R5382 Chronic fatigue, unspecified: Secondary | ICD-10-CM | POA: Diagnosis not present

## 2020-05-09 DIAGNOSIS — F17220 Nicotine dependence, chewing tobacco, uncomplicated: Secondary | ICD-10-CM | POA: Diagnosis not present

## 2020-05-09 DIAGNOSIS — C61 Malignant neoplasm of prostate: Secondary | ICD-10-CM | POA: Diagnosis not present

## 2020-05-09 DIAGNOSIS — M4802 Spinal stenosis, cervical region: Secondary | ICD-10-CM | POA: Diagnosis not present

## 2020-05-09 DIAGNOSIS — F410 Panic disorder [episodic paroxysmal anxiety] without agoraphobia: Secondary | ICD-10-CM | POA: Diagnosis not present

## 2020-05-09 DIAGNOSIS — Z9079 Acquired absence of other genital organ(s): Secondary | ICD-10-CM | POA: Diagnosis not present

## 2020-05-09 DIAGNOSIS — R5382 Chronic fatigue, unspecified: Secondary | ICD-10-CM | POA: Diagnosis not present

## 2020-05-09 DIAGNOSIS — Z87891 Personal history of nicotine dependence: Secondary | ICD-10-CM | POA: Diagnosis not present

## 2020-05-09 DIAGNOSIS — R220 Localized swelling, mass and lump, head: Secondary | ICD-10-CM | POA: Diagnosis not present

## 2020-05-09 DIAGNOSIS — Z741 Need for assistance with personal care: Secondary | ICD-10-CM | POA: Diagnosis not present

## 2020-05-10 DIAGNOSIS — Z741 Need for assistance with personal care: Secondary | ICD-10-CM | POA: Diagnosis not present

## 2020-05-10 DIAGNOSIS — C61 Malignant neoplasm of prostate: Secondary | ICD-10-CM | POA: Diagnosis not present

## 2020-05-10 DIAGNOSIS — R220 Localized swelling, mass and lump, head: Secondary | ICD-10-CM | POA: Diagnosis not present

## 2020-05-10 DIAGNOSIS — F17220 Nicotine dependence, chewing tobacco, uncomplicated: Secondary | ICD-10-CM | POA: Diagnosis not present

## 2020-05-10 DIAGNOSIS — M4802 Spinal stenosis, cervical region: Secondary | ICD-10-CM | POA: Diagnosis not present

## 2020-05-10 DIAGNOSIS — F410 Panic disorder [episodic paroxysmal anxiety] without agoraphobia: Secondary | ICD-10-CM | POA: Diagnosis not present

## 2020-05-10 DIAGNOSIS — R5382 Chronic fatigue, unspecified: Secondary | ICD-10-CM | POA: Diagnosis not present

## 2020-05-10 DIAGNOSIS — Z87891 Personal history of nicotine dependence: Secondary | ICD-10-CM | POA: Diagnosis not present

## 2020-05-10 DIAGNOSIS — Z9079 Acquired absence of other genital organ(s): Secondary | ICD-10-CM | POA: Diagnosis not present

## 2020-05-11 DIAGNOSIS — C61 Malignant neoplasm of prostate: Secondary | ICD-10-CM | POA: Diagnosis not present

## 2020-05-11 DIAGNOSIS — Z741 Need for assistance with personal care: Secondary | ICD-10-CM | POA: Diagnosis not present

## 2020-05-11 DIAGNOSIS — F17220 Nicotine dependence, chewing tobacco, uncomplicated: Secondary | ICD-10-CM | POA: Diagnosis not present

## 2020-05-11 DIAGNOSIS — R5382 Chronic fatigue, unspecified: Secondary | ICD-10-CM | POA: Diagnosis not present

## 2020-05-11 DIAGNOSIS — R220 Localized swelling, mass and lump, head: Secondary | ICD-10-CM | POA: Diagnosis not present

## 2020-05-11 DIAGNOSIS — Z87891 Personal history of nicotine dependence: Secondary | ICD-10-CM | POA: Diagnosis not present

## 2020-05-11 DIAGNOSIS — M4802 Spinal stenosis, cervical region: Secondary | ICD-10-CM | POA: Diagnosis not present

## 2020-05-11 DIAGNOSIS — F410 Panic disorder [episodic paroxysmal anxiety] without agoraphobia: Secondary | ICD-10-CM | POA: Diagnosis not present

## 2020-05-11 DIAGNOSIS — Z9079 Acquired absence of other genital organ(s): Secondary | ICD-10-CM | POA: Diagnosis not present

## 2020-05-12 DIAGNOSIS — F410 Panic disorder [episodic paroxysmal anxiety] without agoraphobia: Secondary | ICD-10-CM | POA: Diagnosis not present

## 2020-05-12 DIAGNOSIS — Z87891 Personal history of nicotine dependence: Secondary | ICD-10-CM | POA: Diagnosis not present

## 2020-05-12 DIAGNOSIS — F17220 Nicotine dependence, chewing tobacco, uncomplicated: Secondary | ICD-10-CM | POA: Diagnosis not present

## 2020-05-12 DIAGNOSIS — R5382 Chronic fatigue, unspecified: Secondary | ICD-10-CM | POA: Diagnosis not present

## 2020-05-12 DIAGNOSIS — C61 Malignant neoplasm of prostate: Secondary | ICD-10-CM | POA: Diagnosis not present

## 2020-05-12 DIAGNOSIS — M4802 Spinal stenosis, cervical region: Secondary | ICD-10-CM | POA: Diagnosis not present

## 2020-05-12 DIAGNOSIS — R220 Localized swelling, mass and lump, head: Secondary | ICD-10-CM | POA: Diagnosis not present

## 2020-05-12 DIAGNOSIS — Z9079 Acquired absence of other genital organ(s): Secondary | ICD-10-CM | POA: Diagnosis not present

## 2020-05-12 DIAGNOSIS — Z741 Need for assistance with personal care: Secondary | ICD-10-CM | POA: Diagnosis not present

## 2020-05-13 ENCOUNTER — Other Ambulatory Visit: Payer: Self-pay | Admitting: Hospice and Palliative Medicine

## 2020-05-13 ENCOUNTER — Other Ambulatory Visit: Payer: Self-pay | Admitting: Internal Medicine

## 2020-05-13 DIAGNOSIS — M4802 Spinal stenosis, cervical region: Secondary | ICD-10-CM | POA: Diagnosis not present

## 2020-05-13 DIAGNOSIS — Z741 Need for assistance with personal care: Secondary | ICD-10-CM | POA: Diagnosis not present

## 2020-05-13 DIAGNOSIS — C7951 Secondary malignant neoplasm of bone: Secondary | ICD-10-CM

## 2020-05-13 DIAGNOSIS — F17220 Nicotine dependence, chewing tobacco, uncomplicated: Secondary | ICD-10-CM | POA: Diagnosis not present

## 2020-05-13 DIAGNOSIS — G893 Neoplasm related pain (acute) (chronic): Secondary | ICD-10-CM

## 2020-05-13 DIAGNOSIS — C61 Malignant neoplasm of prostate: Secondary | ICD-10-CM | POA: Diagnosis not present

## 2020-05-13 DIAGNOSIS — Z87891 Personal history of nicotine dependence: Secondary | ICD-10-CM | POA: Diagnosis not present

## 2020-05-13 DIAGNOSIS — R220 Localized swelling, mass and lump, head: Secondary | ICD-10-CM | POA: Diagnosis not present

## 2020-05-13 DIAGNOSIS — Z9079 Acquired absence of other genital organ(s): Secondary | ICD-10-CM | POA: Diagnosis not present

## 2020-05-13 DIAGNOSIS — F410 Panic disorder [episodic paroxysmal anxiety] without agoraphobia: Secondary | ICD-10-CM | POA: Diagnosis not present

## 2020-05-13 DIAGNOSIS — R5382 Chronic fatigue, unspecified: Secondary | ICD-10-CM | POA: Diagnosis not present

## 2020-05-14 ENCOUNTER — Other Ambulatory Visit: Payer: Self-pay

## 2020-05-14 ENCOUNTER — Other Ambulatory Visit: Payer: Self-pay | Admitting: Hospice and Palliative Medicine

## 2020-05-14 DIAGNOSIS — Z741 Need for assistance with personal care: Secondary | ICD-10-CM | POA: Diagnosis not present

## 2020-05-14 DIAGNOSIS — R5382 Chronic fatigue, unspecified: Secondary | ICD-10-CM | POA: Diagnosis not present

## 2020-05-14 DIAGNOSIS — Z87891 Personal history of nicotine dependence: Secondary | ICD-10-CM | POA: Diagnosis not present

## 2020-05-14 DIAGNOSIS — F410 Panic disorder [episodic paroxysmal anxiety] without agoraphobia: Secondary | ICD-10-CM | POA: Diagnosis not present

## 2020-05-14 DIAGNOSIS — C61 Malignant neoplasm of prostate: Secondary | ICD-10-CM | POA: Diagnosis not present

## 2020-05-14 DIAGNOSIS — Z9079 Acquired absence of other genital organ(s): Secondary | ICD-10-CM | POA: Diagnosis not present

## 2020-05-14 DIAGNOSIS — F17220 Nicotine dependence, chewing tobacco, uncomplicated: Secondary | ICD-10-CM | POA: Diagnosis not present

## 2020-05-14 DIAGNOSIS — R220 Localized swelling, mass and lump, head: Secondary | ICD-10-CM | POA: Diagnosis not present

## 2020-05-14 DIAGNOSIS — M4802 Spinal stenosis, cervical region: Secondary | ICD-10-CM | POA: Diagnosis not present

## 2020-05-14 MED ORDER — NYSTATIN 100000 UNIT/ML MT SUSP
OROMUCOSAL | 0 refills | Status: AC
  Filled 2020-05-14: qty 300, 20d supply, fill #0

## 2020-05-14 MED ORDER — FENTANYL 50 MCG/HR TD PT72
MEDICATED_PATCH | TRANSDERMAL | 0 refills | Status: DC
  Filled 2020-05-14: qty 15, 30d supply, fill #0

## 2020-05-14 MED ORDER — FENTANYL 100 MCG/HR TD PT72
MEDICATED_PATCH | TRANSDERMAL | 0 refills | Status: DC
Start: 2020-05-14 — End: 2020-06-10
  Filled 2020-05-14: qty 15, 30d supply, fill #0

## 2020-05-14 NOTE — Progress Notes (Signed)
Rx for fentanyl patches sent to pharmacy per request of hospice nurse.

## 2020-05-15 DIAGNOSIS — R5382 Chronic fatigue, unspecified: Secondary | ICD-10-CM | POA: Diagnosis not present

## 2020-05-15 DIAGNOSIS — Z741 Need for assistance with personal care: Secondary | ICD-10-CM | POA: Diagnosis not present

## 2020-05-15 DIAGNOSIS — Z87891 Personal history of nicotine dependence: Secondary | ICD-10-CM | POA: Diagnosis not present

## 2020-05-15 DIAGNOSIS — C61 Malignant neoplasm of prostate: Secondary | ICD-10-CM | POA: Diagnosis not present

## 2020-05-15 DIAGNOSIS — R220 Localized swelling, mass and lump, head: Secondary | ICD-10-CM | POA: Diagnosis not present

## 2020-05-15 DIAGNOSIS — F410 Panic disorder [episodic paroxysmal anxiety] without agoraphobia: Secondary | ICD-10-CM | POA: Diagnosis not present

## 2020-05-15 DIAGNOSIS — F17220 Nicotine dependence, chewing tobacco, uncomplicated: Secondary | ICD-10-CM | POA: Diagnosis not present

## 2020-05-15 DIAGNOSIS — M4802 Spinal stenosis, cervical region: Secondary | ICD-10-CM | POA: Diagnosis not present

## 2020-05-15 DIAGNOSIS — Z9079 Acquired absence of other genital organ(s): Secondary | ICD-10-CM | POA: Diagnosis not present

## 2020-05-16 DIAGNOSIS — Z87891 Personal history of nicotine dependence: Secondary | ICD-10-CM | POA: Diagnosis not present

## 2020-05-16 DIAGNOSIS — R5382 Chronic fatigue, unspecified: Secondary | ICD-10-CM | POA: Diagnosis not present

## 2020-05-16 DIAGNOSIS — Z9079 Acquired absence of other genital organ(s): Secondary | ICD-10-CM | POA: Diagnosis not present

## 2020-05-16 DIAGNOSIS — Z741 Need for assistance with personal care: Secondary | ICD-10-CM | POA: Diagnosis not present

## 2020-05-16 DIAGNOSIS — F410 Panic disorder [episodic paroxysmal anxiety] without agoraphobia: Secondary | ICD-10-CM | POA: Diagnosis not present

## 2020-05-16 DIAGNOSIS — C61 Malignant neoplasm of prostate: Secondary | ICD-10-CM | POA: Diagnosis not present

## 2020-05-16 DIAGNOSIS — M4802 Spinal stenosis, cervical region: Secondary | ICD-10-CM | POA: Diagnosis not present

## 2020-05-16 DIAGNOSIS — R220 Localized swelling, mass and lump, head: Secondary | ICD-10-CM | POA: Diagnosis not present

## 2020-05-16 DIAGNOSIS — F17220 Nicotine dependence, chewing tobacco, uncomplicated: Secondary | ICD-10-CM | POA: Diagnosis not present

## 2020-05-17 DIAGNOSIS — M4802 Spinal stenosis, cervical region: Secondary | ICD-10-CM | POA: Diagnosis not present

## 2020-05-17 DIAGNOSIS — C61 Malignant neoplasm of prostate: Secondary | ICD-10-CM | POA: Diagnosis not present

## 2020-05-17 DIAGNOSIS — F410 Panic disorder [episodic paroxysmal anxiety] without agoraphobia: Secondary | ICD-10-CM | POA: Diagnosis not present

## 2020-05-17 DIAGNOSIS — Z87891 Personal history of nicotine dependence: Secondary | ICD-10-CM | POA: Diagnosis not present

## 2020-05-17 DIAGNOSIS — R5382 Chronic fatigue, unspecified: Secondary | ICD-10-CM | POA: Diagnosis not present

## 2020-05-17 DIAGNOSIS — R220 Localized swelling, mass and lump, head: Secondary | ICD-10-CM | POA: Diagnosis not present

## 2020-05-17 DIAGNOSIS — Z9079 Acquired absence of other genital organ(s): Secondary | ICD-10-CM | POA: Diagnosis not present

## 2020-05-17 DIAGNOSIS — Z741 Need for assistance with personal care: Secondary | ICD-10-CM | POA: Diagnosis not present

## 2020-05-17 DIAGNOSIS — F17220 Nicotine dependence, chewing tobacco, uncomplicated: Secondary | ICD-10-CM | POA: Diagnosis not present

## 2020-05-18 ENCOUNTER — Other Ambulatory Visit: Payer: Self-pay

## 2020-05-18 DIAGNOSIS — F410 Panic disorder [episodic paroxysmal anxiety] without agoraphobia: Secondary | ICD-10-CM | POA: Diagnosis not present

## 2020-05-18 DIAGNOSIS — M4802 Spinal stenosis, cervical region: Secondary | ICD-10-CM | POA: Diagnosis not present

## 2020-05-18 DIAGNOSIS — Z9079 Acquired absence of other genital organ(s): Secondary | ICD-10-CM | POA: Diagnosis not present

## 2020-05-18 DIAGNOSIS — F17220 Nicotine dependence, chewing tobacco, uncomplicated: Secondary | ICD-10-CM | POA: Diagnosis not present

## 2020-05-18 DIAGNOSIS — C61 Malignant neoplasm of prostate: Secondary | ICD-10-CM | POA: Diagnosis not present

## 2020-05-18 DIAGNOSIS — Z87891 Personal history of nicotine dependence: Secondary | ICD-10-CM | POA: Diagnosis not present

## 2020-05-18 DIAGNOSIS — Z741 Need for assistance with personal care: Secondary | ICD-10-CM | POA: Diagnosis not present

## 2020-05-18 DIAGNOSIS — R5382 Chronic fatigue, unspecified: Secondary | ICD-10-CM | POA: Diagnosis not present

## 2020-05-18 DIAGNOSIS — R220 Localized swelling, mass and lump, head: Secondary | ICD-10-CM | POA: Diagnosis not present

## 2020-05-19 ENCOUNTER — Other Ambulatory Visit: Payer: Self-pay | Admitting: Hospice and Palliative Medicine

## 2020-05-19 ENCOUNTER — Other Ambulatory Visit: Payer: Self-pay

## 2020-05-19 DIAGNOSIS — Z87891 Personal history of nicotine dependence: Secondary | ICD-10-CM | POA: Diagnosis not present

## 2020-05-19 DIAGNOSIS — M4802 Spinal stenosis, cervical region: Secondary | ICD-10-CM | POA: Diagnosis not present

## 2020-05-19 DIAGNOSIS — F17220 Nicotine dependence, chewing tobacco, uncomplicated: Secondary | ICD-10-CM | POA: Diagnosis not present

## 2020-05-19 DIAGNOSIS — R5382 Chronic fatigue, unspecified: Secondary | ICD-10-CM | POA: Diagnosis not present

## 2020-05-19 DIAGNOSIS — F410 Panic disorder [episodic paroxysmal anxiety] without agoraphobia: Secondary | ICD-10-CM | POA: Diagnosis not present

## 2020-05-19 DIAGNOSIS — Z741 Need for assistance with personal care: Secondary | ICD-10-CM | POA: Diagnosis not present

## 2020-05-19 DIAGNOSIS — R220 Localized swelling, mass and lump, head: Secondary | ICD-10-CM | POA: Diagnosis not present

## 2020-05-19 DIAGNOSIS — Z9079 Acquired absence of other genital organ(s): Secondary | ICD-10-CM | POA: Diagnosis not present

## 2020-05-19 DIAGNOSIS — C61 Malignant neoplasm of prostate: Secondary | ICD-10-CM | POA: Diagnosis not present

## 2020-05-19 MED ORDER — OXYCODONE HCL 15 MG PO TABS
ORAL_TABLET | ORAL | 0 refills | Status: DC
  Filled 2020-05-19: qty 90, 15d supply, fill #0

## 2020-05-19 NOTE — Progress Notes (Signed)
I received a call from patient's hospice nurse requesting refill of oxycodone. Patient had a recent fall resulting in a skin tear and pain has been worse following this event. Rx sent to pharmacy.

## 2020-05-20 DIAGNOSIS — R220 Localized swelling, mass and lump, head: Secondary | ICD-10-CM | POA: Diagnosis not present

## 2020-05-20 DIAGNOSIS — Z87891 Personal history of nicotine dependence: Secondary | ICD-10-CM | POA: Diagnosis not present

## 2020-05-20 DIAGNOSIS — Z741 Need for assistance with personal care: Secondary | ICD-10-CM | POA: Diagnosis not present

## 2020-05-20 DIAGNOSIS — F17220 Nicotine dependence, chewing tobacco, uncomplicated: Secondary | ICD-10-CM | POA: Diagnosis not present

## 2020-05-20 DIAGNOSIS — C61 Malignant neoplasm of prostate: Secondary | ICD-10-CM | POA: Diagnosis not present

## 2020-05-20 DIAGNOSIS — Z9079 Acquired absence of other genital organ(s): Secondary | ICD-10-CM | POA: Diagnosis not present

## 2020-05-20 DIAGNOSIS — R5382 Chronic fatigue, unspecified: Secondary | ICD-10-CM | POA: Diagnosis not present

## 2020-05-20 DIAGNOSIS — M4802 Spinal stenosis, cervical region: Secondary | ICD-10-CM | POA: Diagnosis not present

## 2020-05-20 DIAGNOSIS — F410 Panic disorder [episodic paroxysmal anxiety] without agoraphobia: Secondary | ICD-10-CM | POA: Diagnosis not present

## 2020-05-21 DIAGNOSIS — Z87891 Personal history of nicotine dependence: Secondary | ICD-10-CM | POA: Diagnosis not present

## 2020-05-21 DIAGNOSIS — Z9079 Acquired absence of other genital organ(s): Secondary | ICD-10-CM | POA: Diagnosis not present

## 2020-05-21 DIAGNOSIS — R220 Localized swelling, mass and lump, head: Secondary | ICD-10-CM | POA: Diagnosis not present

## 2020-05-21 DIAGNOSIS — F410 Panic disorder [episodic paroxysmal anxiety] without agoraphobia: Secondary | ICD-10-CM | POA: Diagnosis not present

## 2020-05-21 DIAGNOSIS — R5382 Chronic fatigue, unspecified: Secondary | ICD-10-CM | POA: Diagnosis not present

## 2020-05-21 DIAGNOSIS — F17220 Nicotine dependence, chewing tobacco, uncomplicated: Secondary | ICD-10-CM | POA: Diagnosis not present

## 2020-05-21 DIAGNOSIS — C61 Malignant neoplasm of prostate: Secondary | ICD-10-CM | POA: Diagnosis not present

## 2020-05-21 DIAGNOSIS — M4802 Spinal stenosis, cervical region: Secondary | ICD-10-CM | POA: Diagnosis not present

## 2020-05-21 DIAGNOSIS — Z741 Need for assistance with personal care: Secondary | ICD-10-CM | POA: Diagnosis not present

## 2020-05-22 DIAGNOSIS — C61 Malignant neoplasm of prostate: Secondary | ICD-10-CM | POA: Diagnosis not present

## 2020-05-22 DIAGNOSIS — Z87891 Personal history of nicotine dependence: Secondary | ICD-10-CM | POA: Diagnosis not present

## 2020-05-22 DIAGNOSIS — F17220 Nicotine dependence, chewing tobacco, uncomplicated: Secondary | ICD-10-CM | POA: Diagnosis not present

## 2020-05-22 DIAGNOSIS — Z9079 Acquired absence of other genital organ(s): Secondary | ICD-10-CM | POA: Diagnosis not present

## 2020-05-22 DIAGNOSIS — R5382 Chronic fatigue, unspecified: Secondary | ICD-10-CM | POA: Diagnosis not present

## 2020-05-22 DIAGNOSIS — F410 Panic disorder [episodic paroxysmal anxiety] without agoraphobia: Secondary | ICD-10-CM | POA: Diagnosis not present

## 2020-05-22 DIAGNOSIS — M4802 Spinal stenosis, cervical region: Secondary | ICD-10-CM | POA: Diagnosis not present

## 2020-05-22 DIAGNOSIS — Z741 Need for assistance with personal care: Secondary | ICD-10-CM | POA: Diagnosis not present

## 2020-05-22 DIAGNOSIS — R220 Localized swelling, mass and lump, head: Secondary | ICD-10-CM | POA: Diagnosis not present

## 2020-05-23 ENCOUNTER — Other Ambulatory Visit: Payer: Self-pay | Admitting: Hospice and Palliative Medicine

## 2020-05-23 ENCOUNTER — Other Ambulatory Visit: Payer: Self-pay | Admitting: Internal Medicine

## 2020-05-23 DIAGNOSIS — M4802 Spinal stenosis, cervical region: Secondary | ICD-10-CM | POA: Diagnosis not present

## 2020-05-23 DIAGNOSIS — I10 Essential (primary) hypertension: Secondary | ICD-10-CM

## 2020-05-23 DIAGNOSIS — F410 Panic disorder [episodic paroxysmal anxiety] without agoraphobia: Secondary | ICD-10-CM | POA: Diagnosis not present

## 2020-05-23 DIAGNOSIS — Z741 Need for assistance with personal care: Secondary | ICD-10-CM | POA: Diagnosis not present

## 2020-05-23 DIAGNOSIS — C61 Malignant neoplasm of prostate: Secondary | ICD-10-CM | POA: Diagnosis not present

## 2020-05-23 DIAGNOSIS — R220 Localized swelling, mass and lump, head: Secondary | ICD-10-CM | POA: Diagnosis not present

## 2020-05-23 DIAGNOSIS — R5382 Chronic fatigue, unspecified: Secondary | ICD-10-CM | POA: Diagnosis not present

## 2020-05-23 DIAGNOSIS — F17220 Nicotine dependence, chewing tobacco, uncomplicated: Secondary | ICD-10-CM | POA: Diagnosis not present

## 2020-05-23 DIAGNOSIS — Z9079 Acquired absence of other genital organ(s): Secondary | ICD-10-CM | POA: Diagnosis not present

## 2020-05-23 DIAGNOSIS — Z87891 Personal history of nicotine dependence: Secondary | ICD-10-CM | POA: Diagnosis not present

## 2020-05-23 MED FILL — Gabapentin Cap 300 MG: ORAL | 30 days supply | Qty: 90 | Fill #0 | Status: AC

## 2020-05-23 MED FILL — Promethazine HCl Tab 25 MG: ORAL | 30 days supply | Qty: 90 | Fill #0 | Status: AC

## 2020-05-24 ENCOUNTER — Encounter: Payer: Self-pay | Admitting: Internal Medicine

## 2020-05-24 ENCOUNTER — Other Ambulatory Visit: Payer: Self-pay

## 2020-05-24 DIAGNOSIS — F17220 Nicotine dependence, chewing tobacco, uncomplicated: Secondary | ICD-10-CM | POA: Diagnosis not present

## 2020-05-24 DIAGNOSIS — C61 Malignant neoplasm of prostate: Secondary | ICD-10-CM | POA: Diagnosis not present

## 2020-05-24 DIAGNOSIS — Z741 Need for assistance with personal care: Secondary | ICD-10-CM | POA: Diagnosis not present

## 2020-05-24 DIAGNOSIS — R220 Localized swelling, mass and lump, head: Secondary | ICD-10-CM | POA: Diagnosis not present

## 2020-05-24 DIAGNOSIS — Z87891 Personal history of nicotine dependence: Secondary | ICD-10-CM | POA: Diagnosis not present

## 2020-05-24 DIAGNOSIS — Z9079 Acquired absence of other genital organ(s): Secondary | ICD-10-CM | POA: Diagnosis not present

## 2020-05-24 DIAGNOSIS — F410 Panic disorder [episodic paroxysmal anxiety] without agoraphobia: Secondary | ICD-10-CM | POA: Diagnosis not present

## 2020-05-24 DIAGNOSIS — M4802 Spinal stenosis, cervical region: Secondary | ICD-10-CM | POA: Diagnosis not present

## 2020-05-24 DIAGNOSIS — R5382 Chronic fatigue, unspecified: Secondary | ICD-10-CM | POA: Diagnosis not present

## 2020-05-24 MED ORDER — METOPROLOL TARTRATE 25 MG PO TABS
ORAL_TABLET | Freq: Two times a day (BID) | ORAL | 1 refills | Status: DC
  Filled 2020-05-24: qty 60, 30d supply, fill #0
  Filled 2020-08-19: qty 60, 30d supply, fill #1

## 2020-05-25 ENCOUNTER — Other Ambulatory Visit: Payer: Self-pay

## 2020-05-25 DIAGNOSIS — Z87891 Personal history of nicotine dependence: Secondary | ICD-10-CM | POA: Diagnosis not present

## 2020-05-25 DIAGNOSIS — R220 Localized swelling, mass and lump, head: Secondary | ICD-10-CM | POA: Diagnosis not present

## 2020-05-25 DIAGNOSIS — Z9079 Acquired absence of other genital organ(s): Secondary | ICD-10-CM | POA: Diagnosis not present

## 2020-05-25 DIAGNOSIS — C61 Malignant neoplasm of prostate: Secondary | ICD-10-CM | POA: Diagnosis not present

## 2020-05-25 DIAGNOSIS — M4802 Spinal stenosis, cervical region: Secondary | ICD-10-CM | POA: Diagnosis not present

## 2020-05-25 DIAGNOSIS — Z741 Need for assistance with personal care: Secondary | ICD-10-CM | POA: Diagnosis not present

## 2020-05-25 DIAGNOSIS — R5382 Chronic fatigue, unspecified: Secondary | ICD-10-CM | POA: Diagnosis not present

## 2020-05-25 DIAGNOSIS — F410 Panic disorder [episodic paroxysmal anxiety] without agoraphobia: Secondary | ICD-10-CM | POA: Diagnosis not present

## 2020-05-25 DIAGNOSIS — F17220 Nicotine dependence, chewing tobacco, uncomplicated: Secondary | ICD-10-CM | POA: Diagnosis not present

## 2020-05-25 MED ORDER — LORAZEPAM 0.5 MG PO TABS
ORAL_TABLET | ORAL | 0 refills | Status: DC
  Filled 2020-05-25: qty 60, 10d supply, fill #0

## 2020-05-26 DIAGNOSIS — F17220 Nicotine dependence, chewing tobacco, uncomplicated: Secondary | ICD-10-CM | POA: Diagnosis not present

## 2020-05-26 DIAGNOSIS — Z87891 Personal history of nicotine dependence: Secondary | ICD-10-CM | POA: Diagnosis not present

## 2020-05-26 DIAGNOSIS — M4802 Spinal stenosis, cervical region: Secondary | ICD-10-CM | POA: Diagnosis not present

## 2020-05-26 DIAGNOSIS — R220 Localized swelling, mass and lump, head: Secondary | ICD-10-CM | POA: Diagnosis not present

## 2020-05-26 DIAGNOSIS — R5382 Chronic fatigue, unspecified: Secondary | ICD-10-CM | POA: Diagnosis not present

## 2020-05-26 DIAGNOSIS — Z9079 Acquired absence of other genital organ(s): Secondary | ICD-10-CM | POA: Diagnosis not present

## 2020-05-26 DIAGNOSIS — Z741 Need for assistance with personal care: Secondary | ICD-10-CM | POA: Diagnosis not present

## 2020-05-26 DIAGNOSIS — F410 Panic disorder [episodic paroxysmal anxiety] without agoraphobia: Secondary | ICD-10-CM | POA: Diagnosis not present

## 2020-05-26 DIAGNOSIS — C61 Malignant neoplasm of prostate: Secondary | ICD-10-CM | POA: Diagnosis not present

## 2020-05-27 DIAGNOSIS — F410 Panic disorder [episodic paroxysmal anxiety] without agoraphobia: Secondary | ICD-10-CM | POA: Diagnosis not present

## 2020-05-27 DIAGNOSIS — Z741 Need for assistance with personal care: Secondary | ICD-10-CM | POA: Diagnosis not present

## 2020-05-27 DIAGNOSIS — Z9079 Acquired absence of other genital organ(s): Secondary | ICD-10-CM | POA: Diagnosis not present

## 2020-05-27 DIAGNOSIS — M4802 Spinal stenosis, cervical region: Secondary | ICD-10-CM | POA: Diagnosis not present

## 2020-05-27 DIAGNOSIS — Z87891 Personal history of nicotine dependence: Secondary | ICD-10-CM | POA: Diagnosis not present

## 2020-05-27 DIAGNOSIS — C61 Malignant neoplasm of prostate: Secondary | ICD-10-CM | POA: Diagnosis not present

## 2020-05-27 DIAGNOSIS — R5382 Chronic fatigue, unspecified: Secondary | ICD-10-CM | POA: Diagnosis not present

## 2020-05-27 DIAGNOSIS — F17220 Nicotine dependence, chewing tobacco, uncomplicated: Secondary | ICD-10-CM | POA: Diagnosis not present

## 2020-05-27 DIAGNOSIS — R220 Localized swelling, mass and lump, head: Secondary | ICD-10-CM | POA: Diagnosis not present

## 2020-05-28 DIAGNOSIS — R220 Localized swelling, mass and lump, head: Secondary | ICD-10-CM | POA: Diagnosis not present

## 2020-05-28 DIAGNOSIS — Z741 Need for assistance with personal care: Secondary | ICD-10-CM | POA: Diagnosis not present

## 2020-05-28 DIAGNOSIS — Z87891 Personal history of nicotine dependence: Secondary | ICD-10-CM | POA: Diagnosis not present

## 2020-05-28 DIAGNOSIS — F17220 Nicotine dependence, chewing tobacco, uncomplicated: Secondary | ICD-10-CM | POA: Diagnosis not present

## 2020-05-28 DIAGNOSIS — F410 Panic disorder [episodic paroxysmal anxiety] without agoraphobia: Secondary | ICD-10-CM | POA: Diagnosis not present

## 2020-05-28 DIAGNOSIS — M4802 Spinal stenosis, cervical region: Secondary | ICD-10-CM | POA: Diagnosis not present

## 2020-05-28 DIAGNOSIS — R5382 Chronic fatigue, unspecified: Secondary | ICD-10-CM | POA: Diagnosis not present

## 2020-05-28 DIAGNOSIS — Z9079 Acquired absence of other genital organ(s): Secondary | ICD-10-CM | POA: Diagnosis not present

## 2020-05-28 DIAGNOSIS — C61 Malignant neoplasm of prostate: Secondary | ICD-10-CM | POA: Diagnosis not present

## 2020-05-29 DIAGNOSIS — Z87891 Personal history of nicotine dependence: Secondary | ICD-10-CM | POA: Diagnosis not present

## 2020-05-29 DIAGNOSIS — R5382 Chronic fatigue, unspecified: Secondary | ICD-10-CM | POA: Diagnosis not present

## 2020-05-29 DIAGNOSIS — F17220 Nicotine dependence, chewing tobacco, uncomplicated: Secondary | ICD-10-CM | POA: Diagnosis not present

## 2020-05-29 DIAGNOSIS — C61 Malignant neoplasm of prostate: Secondary | ICD-10-CM | POA: Diagnosis not present

## 2020-05-29 DIAGNOSIS — Z9079 Acquired absence of other genital organ(s): Secondary | ICD-10-CM | POA: Diagnosis not present

## 2020-05-29 DIAGNOSIS — Z741 Need for assistance with personal care: Secondary | ICD-10-CM | POA: Diagnosis not present

## 2020-05-29 DIAGNOSIS — R220 Localized swelling, mass and lump, head: Secondary | ICD-10-CM | POA: Diagnosis not present

## 2020-05-29 DIAGNOSIS — M4802 Spinal stenosis, cervical region: Secondary | ICD-10-CM | POA: Diagnosis not present

## 2020-05-29 DIAGNOSIS — F410 Panic disorder [episodic paroxysmal anxiety] without agoraphobia: Secondary | ICD-10-CM | POA: Diagnosis not present

## 2020-05-30 DIAGNOSIS — Z9079 Acquired absence of other genital organ(s): Secondary | ICD-10-CM | POA: Diagnosis not present

## 2020-05-30 DIAGNOSIS — R220 Localized swelling, mass and lump, head: Secondary | ICD-10-CM | POA: Diagnosis not present

## 2020-05-30 DIAGNOSIS — Z87891 Personal history of nicotine dependence: Secondary | ICD-10-CM | POA: Diagnosis not present

## 2020-05-30 DIAGNOSIS — C61 Malignant neoplasm of prostate: Secondary | ICD-10-CM | POA: Diagnosis not present

## 2020-05-30 DIAGNOSIS — F17220 Nicotine dependence, chewing tobacco, uncomplicated: Secondary | ICD-10-CM | POA: Diagnosis not present

## 2020-05-30 DIAGNOSIS — M4802 Spinal stenosis, cervical region: Secondary | ICD-10-CM | POA: Diagnosis not present

## 2020-05-30 DIAGNOSIS — R5382 Chronic fatigue, unspecified: Secondary | ICD-10-CM | POA: Diagnosis not present

## 2020-05-30 DIAGNOSIS — F410 Panic disorder [episodic paroxysmal anxiety] without agoraphobia: Secondary | ICD-10-CM | POA: Diagnosis not present

## 2020-05-30 DIAGNOSIS — Z741 Need for assistance with personal care: Secondary | ICD-10-CM | POA: Diagnosis not present

## 2020-05-31 DIAGNOSIS — Z87891 Personal history of nicotine dependence: Secondary | ICD-10-CM | POA: Diagnosis not present

## 2020-05-31 DIAGNOSIS — M4802 Spinal stenosis, cervical region: Secondary | ICD-10-CM | POA: Diagnosis not present

## 2020-05-31 DIAGNOSIS — Z741 Need for assistance with personal care: Secondary | ICD-10-CM | POA: Diagnosis not present

## 2020-05-31 DIAGNOSIS — R220 Localized swelling, mass and lump, head: Secondary | ICD-10-CM | POA: Diagnosis not present

## 2020-05-31 DIAGNOSIS — R5382 Chronic fatigue, unspecified: Secondary | ICD-10-CM | POA: Diagnosis not present

## 2020-05-31 DIAGNOSIS — C61 Malignant neoplasm of prostate: Secondary | ICD-10-CM | POA: Diagnosis not present

## 2020-05-31 DIAGNOSIS — F410 Panic disorder [episodic paroxysmal anxiety] without agoraphobia: Secondary | ICD-10-CM | POA: Diagnosis not present

## 2020-05-31 DIAGNOSIS — F17220 Nicotine dependence, chewing tobacco, uncomplicated: Secondary | ICD-10-CM | POA: Diagnosis not present

## 2020-05-31 DIAGNOSIS — Z9079 Acquired absence of other genital organ(s): Secondary | ICD-10-CM | POA: Diagnosis not present

## 2020-06-01 DIAGNOSIS — Z9079 Acquired absence of other genital organ(s): Secondary | ICD-10-CM | POA: Diagnosis not present

## 2020-06-01 DIAGNOSIS — F17220 Nicotine dependence, chewing tobacco, uncomplicated: Secondary | ICD-10-CM | POA: Diagnosis not present

## 2020-06-01 DIAGNOSIS — C61 Malignant neoplasm of prostate: Secondary | ICD-10-CM | POA: Diagnosis not present

## 2020-06-01 DIAGNOSIS — F410 Panic disorder [episodic paroxysmal anxiety] without agoraphobia: Secondary | ICD-10-CM | POA: Diagnosis not present

## 2020-06-01 DIAGNOSIS — Z741 Need for assistance with personal care: Secondary | ICD-10-CM | POA: Diagnosis not present

## 2020-06-01 DIAGNOSIS — Z87891 Personal history of nicotine dependence: Secondary | ICD-10-CM | POA: Diagnosis not present

## 2020-06-01 DIAGNOSIS — R220 Localized swelling, mass and lump, head: Secondary | ICD-10-CM | POA: Diagnosis not present

## 2020-06-01 DIAGNOSIS — M4802 Spinal stenosis, cervical region: Secondary | ICD-10-CM | POA: Diagnosis not present

## 2020-06-01 DIAGNOSIS — R5382 Chronic fatigue, unspecified: Secondary | ICD-10-CM | POA: Diagnosis not present

## 2020-06-02 DIAGNOSIS — F410 Panic disorder [episodic paroxysmal anxiety] without agoraphobia: Secondary | ICD-10-CM | POA: Diagnosis not present

## 2020-06-02 DIAGNOSIS — R5382 Chronic fatigue, unspecified: Secondary | ICD-10-CM | POA: Diagnosis not present

## 2020-06-02 DIAGNOSIS — Z87891 Personal history of nicotine dependence: Secondary | ICD-10-CM | POA: Diagnosis not present

## 2020-06-02 DIAGNOSIS — M4802 Spinal stenosis, cervical region: Secondary | ICD-10-CM | POA: Diagnosis not present

## 2020-06-02 DIAGNOSIS — R220 Localized swelling, mass and lump, head: Secondary | ICD-10-CM | POA: Diagnosis not present

## 2020-06-02 DIAGNOSIS — Z9079 Acquired absence of other genital organ(s): Secondary | ICD-10-CM | POA: Diagnosis not present

## 2020-06-02 DIAGNOSIS — Z741 Need for assistance with personal care: Secondary | ICD-10-CM | POA: Diagnosis not present

## 2020-06-02 DIAGNOSIS — F17220 Nicotine dependence, chewing tobacco, uncomplicated: Secondary | ICD-10-CM | POA: Diagnosis not present

## 2020-06-02 DIAGNOSIS — C61 Malignant neoplasm of prostate: Secondary | ICD-10-CM | POA: Diagnosis not present

## 2020-06-03 DIAGNOSIS — C61 Malignant neoplasm of prostate: Secondary | ICD-10-CM | POA: Diagnosis not present

## 2020-06-03 DIAGNOSIS — Z87891 Personal history of nicotine dependence: Secondary | ICD-10-CM | POA: Diagnosis not present

## 2020-06-03 DIAGNOSIS — Z9079 Acquired absence of other genital organ(s): Secondary | ICD-10-CM | POA: Diagnosis not present

## 2020-06-03 DIAGNOSIS — R5382 Chronic fatigue, unspecified: Secondary | ICD-10-CM | POA: Diagnosis not present

## 2020-06-03 DIAGNOSIS — M4802 Spinal stenosis, cervical region: Secondary | ICD-10-CM | POA: Diagnosis not present

## 2020-06-03 DIAGNOSIS — F17220 Nicotine dependence, chewing tobacco, uncomplicated: Secondary | ICD-10-CM | POA: Diagnosis not present

## 2020-06-03 DIAGNOSIS — R220 Localized swelling, mass and lump, head: Secondary | ICD-10-CM | POA: Diagnosis not present

## 2020-06-03 DIAGNOSIS — F410 Panic disorder [episodic paroxysmal anxiety] without agoraphobia: Secondary | ICD-10-CM | POA: Diagnosis not present

## 2020-06-03 DIAGNOSIS — Z741 Need for assistance with personal care: Secondary | ICD-10-CM | POA: Diagnosis not present

## 2020-06-04 DIAGNOSIS — R220 Localized swelling, mass and lump, head: Secondary | ICD-10-CM | POA: Diagnosis not present

## 2020-06-04 DIAGNOSIS — Z741 Need for assistance with personal care: Secondary | ICD-10-CM | POA: Diagnosis not present

## 2020-06-04 DIAGNOSIS — Z9079 Acquired absence of other genital organ(s): Secondary | ICD-10-CM | POA: Diagnosis not present

## 2020-06-04 DIAGNOSIS — M4802 Spinal stenosis, cervical region: Secondary | ICD-10-CM | POA: Diagnosis not present

## 2020-06-04 DIAGNOSIS — Z87891 Personal history of nicotine dependence: Secondary | ICD-10-CM | POA: Diagnosis not present

## 2020-06-04 DIAGNOSIS — C61 Malignant neoplasm of prostate: Secondary | ICD-10-CM | POA: Diagnosis not present

## 2020-06-04 DIAGNOSIS — R5382 Chronic fatigue, unspecified: Secondary | ICD-10-CM | POA: Diagnosis not present

## 2020-06-04 DIAGNOSIS — F17220 Nicotine dependence, chewing tobacco, uncomplicated: Secondary | ICD-10-CM | POA: Diagnosis not present

## 2020-06-04 DIAGNOSIS — F410 Panic disorder [episodic paroxysmal anxiety] without agoraphobia: Secondary | ICD-10-CM | POA: Diagnosis not present

## 2020-06-05 DIAGNOSIS — F17220 Nicotine dependence, chewing tobacco, uncomplicated: Secondary | ICD-10-CM | POA: Diagnosis not present

## 2020-06-05 DIAGNOSIS — R220 Localized swelling, mass and lump, head: Secondary | ICD-10-CM | POA: Diagnosis not present

## 2020-06-05 DIAGNOSIS — R5382 Chronic fatigue, unspecified: Secondary | ICD-10-CM | POA: Diagnosis not present

## 2020-06-05 DIAGNOSIS — Z9079 Acquired absence of other genital organ(s): Secondary | ICD-10-CM | POA: Diagnosis not present

## 2020-06-05 DIAGNOSIS — M4802 Spinal stenosis, cervical region: Secondary | ICD-10-CM | POA: Diagnosis not present

## 2020-06-05 DIAGNOSIS — C61 Malignant neoplasm of prostate: Secondary | ICD-10-CM | POA: Diagnosis not present

## 2020-06-05 DIAGNOSIS — F410 Panic disorder [episodic paroxysmal anxiety] without agoraphobia: Secondary | ICD-10-CM | POA: Diagnosis not present

## 2020-06-05 DIAGNOSIS — Z741 Need for assistance with personal care: Secondary | ICD-10-CM | POA: Diagnosis not present

## 2020-06-05 DIAGNOSIS — Z87891 Personal history of nicotine dependence: Secondary | ICD-10-CM | POA: Diagnosis not present

## 2020-06-06 DIAGNOSIS — Z741 Need for assistance with personal care: Secondary | ICD-10-CM | POA: Diagnosis not present

## 2020-06-06 DIAGNOSIS — Z9079 Acquired absence of other genital organ(s): Secondary | ICD-10-CM | POA: Diagnosis not present

## 2020-06-06 DIAGNOSIS — R5382 Chronic fatigue, unspecified: Secondary | ICD-10-CM | POA: Diagnosis not present

## 2020-06-06 DIAGNOSIS — F17220 Nicotine dependence, chewing tobacco, uncomplicated: Secondary | ICD-10-CM | POA: Diagnosis not present

## 2020-06-06 DIAGNOSIS — M4802 Spinal stenosis, cervical region: Secondary | ICD-10-CM | POA: Diagnosis not present

## 2020-06-06 DIAGNOSIS — Z87891 Personal history of nicotine dependence: Secondary | ICD-10-CM | POA: Diagnosis not present

## 2020-06-06 DIAGNOSIS — F410 Panic disorder [episodic paroxysmal anxiety] without agoraphobia: Secondary | ICD-10-CM | POA: Diagnosis not present

## 2020-06-06 DIAGNOSIS — C61 Malignant neoplasm of prostate: Secondary | ICD-10-CM | POA: Diagnosis not present

## 2020-06-06 DIAGNOSIS — R220 Localized swelling, mass and lump, head: Secondary | ICD-10-CM | POA: Diagnosis not present

## 2020-06-07 DIAGNOSIS — F17220 Nicotine dependence, chewing tobacco, uncomplicated: Secondary | ICD-10-CM | POA: Diagnosis not present

## 2020-06-07 DIAGNOSIS — F410 Panic disorder [episodic paroxysmal anxiety] without agoraphobia: Secondary | ICD-10-CM | POA: Diagnosis not present

## 2020-06-07 DIAGNOSIS — M4802 Spinal stenosis, cervical region: Secondary | ICD-10-CM | POA: Diagnosis not present

## 2020-06-07 DIAGNOSIS — R220 Localized swelling, mass and lump, head: Secondary | ICD-10-CM | POA: Diagnosis not present

## 2020-06-07 DIAGNOSIS — Z9079 Acquired absence of other genital organ(s): Secondary | ICD-10-CM | POA: Diagnosis not present

## 2020-06-07 DIAGNOSIS — R5382 Chronic fatigue, unspecified: Secondary | ICD-10-CM | POA: Diagnosis not present

## 2020-06-07 DIAGNOSIS — Z87891 Personal history of nicotine dependence: Secondary | ICD-10-CM | POA: Diagnosis not present

## 2020-06-07 DIAGNOSIS — Z741 Need for assistance with personal care: Secondary | ICD-10-CM | POA: Diagnosis not present

## 2020-06-07 DIAGNOSIS — C61 Malignant neoplasm of prostate: Secondary | ICD-10-CM | POA: Diagnosis not present

## 2020-06-08 DIAGNOSIS — Z741 Need for assistance with personal care: Secondary | ICD-10-CM | POA: Diagnosis not present

## 2020-06-08 DIAGNOSIS — Z9079 Acquired absence of other genital organ(s): Secondary | ICD-10-CM | POA: Diagnosis not present

## 2020-06-08 DIAGNOSIS — F17220 Nicotine dependence, chewing tobacco, uncomplicated: Secondary | ICD-10-CM | POA: Diagnosis not present

## 2020-06-08 DIAGNOSIS — Z87891 Personal history of nicotine dependence: Secondary | ICD-10-CM | POA: Diagnosis not present

## 2020-06-08 DIAGNOSIS — F410 Panic disorder [episodic paroxysmal anxiety] without agoraphobia: Secondary | ICD-10-CM | POA: Diagnosis not present

## 2020-06-08 DIAGNOSIS — R5382 Chronic fatigue, unspecified: Secondary | ICD-10-CM | POA: Diagnosis not present

## 2020-06-08 DIAGNOSIS — R220 Localized swelling, mass and lump, head: Secondary | ICD-10-CM | POA: Diagnosis not present

## 2020-06-08 DIAGNOSIS — C61 Malignant neoplasm of prostate: Secondary | ICD-10-CM | POA: Diagnosis not present

## 2020-06-08 DIAGNOSIS — M4802 Spinal stenosis, cervical region: Secondary | ICD-10-CM | POA: Diagnosis not present

## 2020-06-09 DIAGNOSIS — Z87891 Personal history of nicotine dependence: Secondary | ICD-10-CM | POA: Diagnosis not present

## 2020-06-09 DIAGNOSIS — Z741 Need for assistance with personal care: Secondary | ICD-10-CM | POA: Diagnosis not present

## 2020-06-09 DIAGNOSIS — C61 Malignant neoplasm of prostate: Secondary | ICD-10-CM | POA: Diagnosis not present

## 2020-06-09 DIAGNOSIS — M4802 Spinal stenosis, cervical region: Secondary | ICD-10-CM | POA: Diagnosis not present

## 2020-06-09 DIAGNOSIS — R220 Localized swelling, mass and lump, head: Secondary | ICD-10-CM | POA: Diagnosis not present

## 2020-06-09 DIAGNOSIS — F17220 Nicotine dependence, chewing tobacco, uncomplicated: Secondary | ICD-10-CM | POA: Diagnosis not present

## 2020-06-09 DIAGNOSIS — Z9079 Acquired absence of other genital organ(s): Secondary | ICD-10-CM | POA: Diagnosis not present

## 2020-06-09 DIAGNOSIS — F410 Panic disorder [episodic paroxysmal anxiety] without agoraphobia: Secondary | ICD-10-CM | POA: Diagnosis not present

## 2020-06-09 DIAGNOSIS — R5382 Chronic fatigue, unspecified: Secondary | ICD-10-CM | POA: Diagnosis not present

## 2020-06-10 ENCOUNTER — Other Ambulatory Visit: Payer: Self-pay | Admitting: Hospice and Palliative Medicine

## 2020-06-10 ENCOUNTER — Other Ambulatory Visit: Payer: Self-pay

## 2020-06-10 DIAGNOSIS — C61 Malignant neoplasm of prostate: Secondary | ICD-10-CM | POA: Diagnosis not present

## 2020-06-10 DIAGNOSIS — G893 Neoplasm related pain (acute) (chronic): Secondary | ICD-10-CM

## 2020-06-10 DIAGNOSIS — C7951 Secondary malignant neoplasm of bone: Secondary | ICD-10-CM

## 2020-06-10 DIAGNOSIS — Z87891 Personal history of nicotine dependence: Secondary | ICD-10-CM | POA: Diagnosis not present

## 2020-06-10 DIAGNOSIS — Z741 Need for assistance with personal care: Secondary | ICD-10-CM | POA: Diagnosis not present

## 2020-06-10 DIAGNOSIS — R220 Localized swelling, mass and lump, head: Secondary | ICD-10-CM | POA: Diagnosis not present

## 2020-06-10 DIAGNOSIS — Z9079 Acquired absence of other genital organ(s): Secondary | ICD-10-CM | POA: Diagnosis not present

## 2020-06-10 DIAGNOSIS — F410 Panic disorder [episodic paroxysmal anxiety] without agoraphobia: Secondary | ICD-10-CM | POA: Diagnosis not present

## 2020-06-10 DIAGNOSIS — R5382 Chronic fatigue, unspecified: Secondary | ICD-10-CM | POA: Diagnosis not present

## 2020-06-10 DIAGNOSIS — F17220 Nicotine dependence, chewing tobacco, uncomplicated: Secondary | ICD-10-CM | POA: Diagnosis not present

## 2020-06-10 DIAGNOSIS — M4802 Spinal stenosis, cervical region: Secondary | ICD-10-CM | POA: Diagnosis not present

## 2020-06-10 MED ORDER — OXYCODONE HCL 15 MG PO TABS
ORAL_TABLET | ORAL | 0 refills | Status: DC
  Filled 2020-06-10: qty 90, fill #0
  Filled 2020-06-11: qty 90, 15d supply, fill #0

## 2020-06-10 MED ORDER — FENTANYL 50 MCG/HR TD PT72
MEDICATED_PATCH | TRANSDERMAL | 0 refills | Status: DC
  Filled 2020-06-10: qty 15, fill #0
  Filled 2020-06-11: qty 15, 30d supply, fill #0

## 2020-06-10 MED ORDER — FENTANYL 100 MCG/HR TD PT72
MEDICATED_PATCH | TRANSDERMAL | 0 refills | Status: DC
  Filled 2020-06-10: qty 15, fill #0
  Filled 2020-06-11: qty 15, 30d supply, fill #0

## 2020-06-10 NOTE — Progress Notes (Signed)
Hospice nurse requested refills of fentanyl and oxycodone. Rx sent.

## 2020-06-11 ENCOUNTER — Other Ambulatory Visit: Payer: Self-pay

## 2020-06-11 ENCOUNTER — Telehealth: Payer: Self-pay | Admitting: Hospice and Palliative Medicine

## 2020-06-11 DIAGNOSIS — R220 Localized swelling, mass and lump, head: Secondary | ICD-10-CM | POA: Diagnosis not present

## 2020-06-11 DIAGNOSIS — Z87891 Personal history of nicotine dependence: Secondary | ICD-10-CM | POA: Diagnosis not present

## 2020-06-11 DIAGNOSIS — Z9079 Acquired absence of other genital organ(s): Secondary | ICD-10-CM | POA: Diagnosis not present

## 2020-06-11 DIAGNOSIS — C61 Malignant neoplasm of prostate: Secondary | ICD-10-CM | POA: Diagnosis not present

## 2020-06-11 DIAGNOSIS — M4802 Spinal stenosis, cervical region: Secondary | ICD-10-CM | POA: Diagnosis not present

## 2020-06-11 DIAGNOSIS — Z741 Need for assistance with personal care: Secondary | ICD-10-CM | POA: Diagnosis not present

## 2020-06-11 DIAGNOSIS — F410 Panic disorder [episodic paroxysmal anxiety] without agoraphobia: Secondary | ICD-10-CM | POA: Diagnosis not present

## 2020-06-11 DIAGNOSIS — F17220 Nicotine dependence, chewing tobacco, uncomplicated: Secondary | ICD-10-CM | POA: Diagnosis not present

## 2020-06-11 DIAGNOSIS — R5382 Chronic fatigue, unspecified: Secondary | ICD-10-CM | POA: Diagnosis not present

## 2020-06-11 MED ORDER — AMOXICILLIN-POT CLAVULANATE 875-125 MG PO TABS
1.0000 | ORAL_TABLET | Freq: Two times a day (BID) | ORAL | 0 refills | Status: DC
  Filled 2020-06-11: qty 20, 10d supply, fill #0

## 2020-06-11 MED ORDER — IPRATROPIUM-ALBUTEROL 0.5-2.5 (3) MG/3ML IN SOLN
3.0000 mL | Freq: Four times a day (QID) | RESPIRATORY_TRACT | 1 refills | Status: AC | PRN
  Filled 2020-06-11: qty 180, 15d supply, fill #0
  Filled 2020-10-12: qty 180, 14d supply, fill #1

## 2020-06-11 MED ORDER — GUAIFENESIN ER 600 MG PO TB12
600.0000 mg | ORAL_TABLET | Freq: Two times a day (BID) | ORAL | 1 refills | Status: DC
  Filled 2020-06-11: qty 40, 20d supply, fill #0

## 2020-06-11 NOTE — Telephone Encounter (Signed)
Received a call from patient's hospice nurse the patient has 3 to 4 days of pulmonary congestion, wheezing, and productive cough with yellow phlegm.  She denies fever or chills.  No rhinorrhea or sore throat.  No muscle aches.  Wife tested him for COVID with a rapid test and it was negative.  We discussed options for COVID PCR and chest x-ray.  RN says that wife would prefer to keep him home and treat him supportively if possible.  Plan: -Augmentin 875mg  BID x 10 days -DuoNebs every 6 as needed -Guaifenesin 600 mg every 12 -Recommend COVID PCR -Consider chest x-ray if symptoms not improving -RN to follow-up over the weekend

## 2020-06-12 DIAGNOSIS — M4802 Spinal stenosis, cervical region: Secondary | ICD-10-CM | POA: Diagnosis not present

## 2020-06-12 DIAGNOSIS — Z741 Need for assistance with personal care: Secondary | ICD-10-CM | POA: Diagnosis not present

## 2020-06-12 DIAGNOSIS — Z9079 Acquired absence of other genital organ(s): Secondary | ICD-10-CM | POA: Diagnosis not present

## 2020-06-12 DIAGNOSIS — C61 Malignant neoplasm of prostate: Secondary | ICD-10-CM | POA: Diagnosis not present

## 2020-06-12 DIAGNOSIS — F17220 Nicotine dependence, chewing tobacco, uncomplicated: Secondary | ICD-10-CM | POA: Diagnosis not present

## 2020-06-12 DIAGNOSIS — Z87891 Personal history of nicotine dependence: Secondary | ICD-10-CM | POA: Diagnosis not present

## 2020-06-12 DIAGNOSIS — R220 Localized swelling, mass and lump, head: Secondary | ICD-10-CM | POA: Diagnosis not present

## 2020-06-12 DIAGNOSIS — F410 Panic disorder [episodic paroxysmal anxiety] without agoraphobia: Secondary | ICD-10-CM | POA: Diagnosis not present

## 2020-06-12 DIAGNOSIS — R5382 Chronic fatigue, unspecified: Secondary | ICD-10-CM | POA: Diagnosis not present

## 2020-06-13 ENCOUNTER — Other Ambulatory Visit: Payer: Self-pay

## 2020-06-13 DIAGNOSIS — M4802 Spinal stenosis, cervical region: Secondary | ICD-10-CM | POA: Diagnosis not present

## 2020-06-13 DIAGNOSIS — F410 Panic disorder [episodic paroxysmal anxiety] without agoraphobia: Secondary | ICD-10-CM | POA: Diagnosis not present

## 2020-06-13 DIAGNOSIS — Z9079 Acquired absence of other genital organ(s): Secondary | ICD-10-CM | POA: Diagnosis not present

## 2020-06-13 DIAGNOSIS — C61 Malignant neoplasm of prostate: Secondary | ICD-10-CM | POA: Diagnosis not present

## 2020-06-13 DIAGNOSIS — R5382 Chronic fatigue, unspecified: Secondary | ICD-10-CM | POA: Diagnosis not present

## 2020-06-13 DIAGNOSIS — F17220 Nicotine dependence, chewing tobacco, uncomplicated: Secondary | ICD-10-CM | POA: Diagnosis not present

## 2020-06-13 DIAGNOSIS — Z741 Need for assistance with personal care: Secondary | ICD-10-CM | POA: Diagnosis not present

## 2020-06-13 DIAGNOSIS — Z87891 Personal history of nicotine dependence: Secondary | ICD-10-CM | POA: Diagnosis not present

## 2020-06-13 DIAGNOSIS — R220 Localized swelling, mass and lump, head: Secondary | ICD-10-CM | POA: Diagnosis not present

## 2020-06-14 ENCOUNTER — Other Ambulatory Visit: Payer: Self-pay

## 2020-06-14 DIAGNOSIS — F410 Panic disorder [episodic paroxysmal anxiety] without agoraphobia: Secondary | ICD-10-CM | POA: Diagnosis not present

## 2020-06-14 DIAGNOSIS — Z87891 Personal history of nicotine dependence: Secondary | ICD-10-CM | POA: Diagnosis not present

## 2020-06-14 DIAGNOSIS — R5382 Chronic fatigue, unspecified: Secondary | ICD-10-CM | POA: Diagnosis not present

## 2020-06-14 DIAGNOSIS — C61 Malignant neoplasm of prostate: Secondary | ICD-10-CM | POA: Diagnosis not present

## 2020-06-14 DIAGNOSIS — Z9079 Acquired absence of other genital organ(s): Secondary | ICD-10-CM | POA: Diagnosis not present

## 2020-06-14 DIAGNOSIS — M4802 Spinal stenosis, cervical region: Secondary | ICD-10-CM | POA: Diagnosis not present

## 2020-06-14 DIAGNOSIS — Z741 Need for assistance with personal care: Secondary | ICD-10-CM | POA: Diagnosis not present

## 2020-06-14 DIAGNOSIS — F17220 Nicotine dependence, chewing tobacco, uncomplicated: Secondary | ICD-10-CM | POA: Diagnosis not present

## 2020-06-14 DIAGNOSIS — R220 Localized swelling, mass and lump, head: Secondary | ICD-10-CM | POA: Diagnosis not present

## 2020-06-15 ENCOUNTER — Other Ambulatory Visit: Payer: Self-pay

## 2020-06-15 DIAGNOSIS — M4802 Spinal stenosis, cervical region: Secondary | ICD-10-CM | POA: Diagnosis not present

## 2020-06-15 DIAGNOSIS — F410 Panic disorder [episodic paroxysmal anxiety] without agoraphobia: Secondary | ICD-10-CM | POA: Diagnosis not present

## 2020-06-15 DIAGNOSIS — R5382 Chronic fatigue, unspecified: Secondary | ICD-10-CM | POA: Diagnosis not present

## 2020-06-15 DIAGNOSIS — Z87891 Personal history of nicotine dependence: Secondary | ICD-10-CM | POA: Diagnosis not present

## 2020-06-15 DIAGNOSIS — R220 Localized swelling, mass and lump, head: Secondary | ICD-10-CM | POA: Diagnosis not present

## 2020-06-15 DIAGNOSIS — Z9079 Acquired absence of other genital organ(s): Secondary | ICD-10-CM | POA: Diagnosis not present

## 2020-06-15 DIAGNOSIS — F17220 Nicotine dependence, chewing tobacco, uncomplicated: Secondary | ICD-10-CM | POA: Diagnosis not present

## 2020-06-15 DIAGNOSIS — C61 Malignant neoplasm of prostate: Secondary | ICD-10-CM | POA: Diagnosis not present

## 2020-06-15 DIAGNOSIS — Z741 Need for assistance with personal care: Secondary | ICD-10-CM | POA: Diagnosis not present

## 2020-06-16 ENCOUNTER — Other Ambulatory Visit: Payer: Self-pay

## 2020-06-16 DIAGNOSIS — M4802 Spinal stenosis, cervical region: Secondary | ICD-10-CM | POA: Diagnosis not present

## 2020-06-16 DIAGNOSIS — Z87891 Personal history of nicotine dependence: Secondary | ICD-10-CM | POA: Diagnosis not present

## 2020-06-16 DIAGNOSIS — F17220 Nicotine dependence, chewing tobacco, uncomplicated: Secondary | ICD-10-CM | POA: Diagnosis not present

## 2020-06-16 DIAGNOSIS — F410 Panic disorder [episodic paroxysmal anxiety] without agoraphobia: Secondary | ICD-10-CM | POA: Diagnosis not present

## 2020-06-16 DIAGNOSIS — C61 Malignant neoplasm of prostate: Secondary | ICD-10-CM | POA: Diagnosis not present

## 2020-06-16 DIAGNOSIS — R220 Localized swelling, mass and lump, head: Secondary | ICD-10-CM | POA: Diagnosis not present

## 2020-06-16 DIAGNOSIS — Z741 Need for assistance with personal care: Secondary | ICD-10-CM | POA: Diagnosis not present

## 2020-06-16 DIAGNOSIS — Z9079 Acquired absence of other genital organ(s): Secondary | ICD-10-CM | POA: Diagnosis not present

## 2020-06-16 DIAGNOSIS — R5382 Chronic fatigue, unspecified: Secondary | ICD-10-CM | POA: Diagnosis not present

## 2020-06-17 ENCOUNTER — Other Ambulatory Visit: Payer: Self-pay

## 2020-06-17 DIAGNOSIS — Z87891 Personal history of nicotine dependence: Secondary | ICD-10-CM | POA: Diagnosis not present

## 2020-06-17 DIAGNOSIS — R5382 Chronic fatigue, unspecified: Secondary | ICD-10-CM | POA: Diagnosis not present

## 2020-06-17 DIAGNOSIS — R220 Localized swelling, mass and lump, head: Secondary | ICD-10-CM | POA: Diagnosis not present

## 2020-06-17 DIAGNOSIS — F410 Panic disorder [episodic paroxysmal anxiety] without agoraphobia: Secondary | ICD-10-CM | POA: Diagnosis not present

## 2020-06-17 DIAGNOSIS — F17220 Nicotine dependence, chewing tobacco, uncomplicated: Secondary | ICD-10-CM | POA: Diagnosis not present

## 2020-06-17 DIAGNOSIS — M4802 Spinal stenosis, cervical region: Secondary | ICD-10-CM | POA: Diagnosis not present

## 2020-06-17 DIAGNOSIS — Z9079 Acquired absence of other genital organ(s): Secondary | ICD-10-CM | POA: Diagnosis not present

## 2020-06-17 DIAGNOSIS — C61 Malignant neoplasm of prostate: Secondary | ICD-10-CM | POA: Diagnosis not present

## 2020-06-17 DIAGNOSIS — Z741 Need for assistance with personal care: Secondary | ICD-10-CM | POA: Diagnosis not present

## 2020-06-17 MED ORDER — LORAZEPAM 0.5 MG PO TABS
ORAL_TABLET | ORAL | 0 refills | Status: DC
  Filled 2020-06-17: qty 60, 10d supply, fill #0

## 2020-06-18 DIAGNOSIS — R220 Localized swelling, mass and lump, head: Secondary | ICD-10-CM | POA: Diagnosis not present

## 2020-06-18 DIAGNOSIS — R5382 Chronic fatigue, unspecified: Secondary | ICD-10-CM | POA: Diagnosis not present

## 2020-06-18 DIAGNOSIS — M4802 Spinal stenosis, cervical region: Secondary | ICD-10-CM | POA: Diagnosis not present

## 2020-06-18 DIAGNOSIS — F17220 Nicotine dependence, chewing tobacco, uncomplicated: Secondary | ICD-10-CM | POA: Diagnosis not present

## 2020-06-18 DIAGNOSIS — C61 Malignant neoplasm of prostate: Secondary | ICD-10-CM | POA: Diagnosis not present

## 2020-06-18 DIAGNOSIS — Z9079 Acquired absence of other genital organ(s): Secondary | ICD-10-CM | POA: Diagnosis not present

## 2020-06-18 DIAGNOSIS — Z87891 Personal history of nicotine dependence: Secondary | ICD-10-CM | POA: Diagnosis not present

## 2020-06-18 DIAGNOSIS — F410 Panic disorder [episodic paroxysmal anxiety] without agoraphobia: Secondary | ICD-10-CM | POA: Diagnosis not present

## 2020-06-18 DIAGNOSIS — Z741 Need for assistance with personal care: Secondary | ICD-10-CM | POA: Diagnosis not present

## 2020-06-19 DIAGNOSIS — R220 Localized swelling, mass and lump, head: Secondary | ICD-10-CM | POA: Diagnosis not present

## 2020-06-19 DIAGNOSIS — F410 Panic disorder [episodic paroxysmal anxiety] without agoraphobia: Secondary | ICD-10-CM | POA: Diagnosis not present

## 2020-06-19 DIAGNOSIS — M4802 Spinal stenosis, cervical region: Secondary | ICD-10-CM | POA: Diagnosis not present

## 2020-06-19 DIAGNOSIS — Z9079 Acquired absence of other genital organ(s): Secondary | ICD-10-CM | POA: Diagnosis not present

## 2020-06-19 DIAGNOSIS — C61 Malignant neoplasm of prostate: Secondary | ICD-10-CM | POA: Diagnosis not present

## 2020-06-19 DIAGNOSIS — F17220 Nicotine dependence, chewing tobacco, uncomplicated: Secondary | ICD-10-CM | POA: Diagnosis not present

## 2020-06-19 DIAGNOSIS — R5382 Chronic fatigue, unspecified: Secondary | ICD-10-CM | POA: Diagnosis not present

## 2020-06-19 DIAGNOSIS — Z741 Need for assistance with personal care: Secondary | ICD-10-CM | POA: Diagnosis not present

## 2020-06-19 DIAGNOSIS — Z87891 Personal history of nicotine dependence: Secondary | ICD-10-CM | POA: Diagnosis not present

## 2020-06-20 DIAGNOSIS — M4802 Spinal stenosis, cervical region: Secondary | ICD-10-CM | POA: Diagnosis not present

## 2020-06-20 DIAGNOSIS — R5382 Chronic fatigue, unspecified: Secondary | ICD-10-CM | POA: Diagnosis not present

## 2020-06-20 DIAGNOSIS — Z9079 Acquired absence of other genital organ(s): Secondary | ICD-10-CM | POA: Diagnosis not present

## 2020-06-20 DIAGNOSIS — F17220 Nicotine dependence, chewing tobacco, uncomplicated: Secondary | ICD-10-CM | POA: Diagnosis not present

## 2020-06-20 DIAGNOSIS — F410 Panic disorder [episodic paroxysmal anxiety] without agoraphobia: Secondary | ICD-10-CM | POA: Diagnosis not present

## 2020-06-20 DIAGNOSIS — Z87891 Personal history of nicotine dependence: Secondary | ICD-10-CM | POA: Diagnosis not present

## 2020-06-20 DIAGNOSIS — R220 Localized swelling, mass and lump, head: Secondary | ICD-10-CM | POA: Diagnosis not present

## 2020-06-20 DIAGNOSIS — Z741 Need for assistance with personal care: Secondary | ICD-10-CM | POA: Diagnosis not present

## 2020-06-20 DIAGNOSIS — C61 Malignant neoplasm of prostate: Secondary | ICD-10-CM | POA: Diagnosis not present

## 2020-06-21 DIAGNOSIS — M4802 Spinal stenosis, cervical region: Secondary | ICD-10-CM | POA: Diagnosis not present

## 2020-06-21 DIAGNOSIS — R5382 Chronic fatigue, unspecified: Secondary | ICD-10-CM | POA: Diagnosis not present

## 2020-06-21 DIAGNOSIS — Z741 Need for assistance with personal care: Secondary | ICD-10-CM | POA: Diagnosis not present

## 2020-06-21 DIAGNOSIS — Z87891 Personal history of nicotine dependence: Secondary | ICD-10-CM | POA: Diagnosis not present

## 2020-06-21 DIAGNOSIS — R220 Localized swelling, mass and lump, head: Secondary | ICD-10-CM | POA: Diagnosis not present

## 2020-06-21 DIAGNOSIS — F17220 Nicotine dependence, chewing tobacco, uncomplicated: Secondary | ICD-10-CM | POA: Diagnosis not present

## 2020-06-21 DIAGNOSIS — F410 Panic disorder [episodic paroxysmal anxiety] without agoraphobia: Secondary | ICD-10-CM | POA: Diagnosis not present

## 2020-06-21 DIAGNOSIS — Z9079 Acquired absence of other genital organ(s): Secondary | ICD-10-CM | POA: Diagnosis not present

## 2020-06-21 DIAGNOSIS — C61 Malignant neoplasm of prostate: Secondary | ICD-10-CM | POA: Diagnosis not present

## 2020-06-22 DIAGNOSIS — R220 Localized swelling, mass and lump, head: Secondary | ICD-10-CM | POA: Diagnosis not present

## 2020-06-22 DIAGNOSIS — Z9079 Acquired absence of other genital organ(s): Secondary | ICD-10-CM | POA: Diagnosis not present

## 2020-06-22 DIAGNOSIS — M4802 Spinal stenosis, cervical region: Secondary | ICD-10-CM | POA: Diagnosis not present

## 2020-06-22 DIAGNOSIS — Z87891 Personal history of nicotine dependence: Secondary | ICD-10-CM | POA: Diagnosis not present

## 2020-06-22 DIAGNOSIS — C61 Malignant neoplasm of prostate: Secondary | ICD-10-CM | POA: Diagnosis not present

## 2020-06-22 DIAGNOSIS — Z741 Need for assistance with personal care: Secondary | ICD-10-CM | POA: Diagnosis not present

## 2020-06-22 DIAGNOSIS — R5382 Chronic fatigue, unspecified: Secondary | ICD-10-CM | POA: Diagnosis not present

## 2020-06-22 DIAGNOSIS — F410 Panic disorder [episodic paroxysmal anxiety] without agoraphobia: Secondary | ICD-10-CM | POA: Diagnosis not present

## 2020-06-22 DIAGNOSIS — F17220 Nicotine dependence, chewing tobacco, uncomplicated: Secondary | ICD-10-CM | POA: Diagnosis not present

## 2020-06-23 ENCOUNTER — Inpatient Hospital Stay: Payer: 59 | Admitting: Internal Medicine

## 2020-06-23 ENCOUNTER — Inpatient Hospital Stay: Payer: 59

## 2020-06-23 DIAGNOSIS — F410 Panic disorder [episodic paroxysmal anxiety] without agoraphobia: Secondary | ICD-10-CM | POA: Diagnosis not present

## 2020-06-23 DIAGNOSIS — R5382 Chronic fatigue, unspecified: Secondary | ICD-10-CM | POA: Diagnosis not present

## 2020-06-23 DIAGNOSIS — Z87891 Personal history of nicotine dependence: Secondary | ICD-10-CM | POA: Diagnosis not present

## 2020-06-23 DIAGNOSIS — C61 Malignant neoplasm of prostate: Secondary | ICD-10-CM | POA: Diagnosis not present

## 2020-06-23 DIAGNOSIS — F17220 Nicotine dependence, chewing tobacco, uncomplicated: Secondary | ICD-10-CM | POA: Diagnosis not present

## 2020-06-23 DIAGNOSIS — R220 Localized swelling, mass and lump, head: Secondary | ICD-10-CM | POA: Diagnosis not present

## 2020-06-23 DIAGNOSIS — M4802 Spinal stenosis, cervical region: Secondary | ICD-10-CM | POA: Diagnosis not present

## 2020-06-23 DIAGNOSIS — Z741 Need for assistance with personal care: Secondary | ICD-10-CM | POA: Diagnosis not present

## 2020-06-23 DIAGNOSIS — Z9079 Acquired absence of other genital organ(s): Secondary | ICD-10-CM | POA: Diagnosis not present

## 2020-06-24 DIAGNOSIS — R5382 Chronic fatigue, unspecified: Secondary | ICD-10-CM | POA: Diagnosis not present

## 2020-06-24 DIAGNOSIS — F17220 Nicotine dependence, chewing tobacco, uncomplicated: Secondary | ICD-10-CM | POA: Diagnosis not present

## 2020-06-24 DIAGNOSIS — Z9079 Acquired absence of other genital organ(s): Secondary | ICD-10-CM | POA: Diagnosis not present

## 2020-06-24 DIAGNOSIS — C61 Malignant neoplasm of prostate: Secondary | ICD-10-CM | POA: Diagnosis not present

## 2020-06-24 DIAGNOSIS — M4802 Spinal stenosis, cervical region: Secondary | ICD-10-CM | POA: Diagnosis not present

## 2020-06-24 DIAGNOSIS — Z87891 Personal history of nicotine dependence: Secondary | ICD-10-CM | POA: Diagnosis not present

## 2020-06-24 DIAGNOSIS — Z741 Need for assistance with personal care: Secondary | ICD-10-CM | POA: Diagnosis not present

## 2020-06-24 DIAGNOSIS — F410 Panic disorder [episodic paroxysmal anxiety] without agoraphobia: Secondary | ICD-10-CM | POA: Diagnosis not present

## 2020-06-24 DIAGNOSIS — R220 Localized swelling, mass and lump, head: Secondary | ICD-10-CM | POA: Diagnosis not present

## 2020-06-25 ENCOUNTER — Encounter
Admission: EM | Disposition: A | Discharge status: Home / Self Care | Payer: Self-pay | Source: Home / Self Care | Attending: Emergency Medicine

## 2020-06-25 ENCOUNTER — Emergency Department: Payer: 59 | Admitting: Anesthesiology

## 2020-06-25 ENCOUNTER — Encounter: Payer: Self-pay | Admitting: Gastroenterology

## 2020-06-25 ENCOUNTER — Other Ambulatory Visit: Payer: Self-pay

## 2020-06-25 ENCOUNTER — Ambulatory Visit
Admission: EM | Admit: 2020-06-25 | Discharge: 2020-06-25 | Disposition: A | Discharge status: Home / Self Care | Payer: 59 | Attending: Gastroenterology | Admitting: Gastroenterology

## 2020-06-25 DIAGNOSIS — Z7982 Long term (current) use of aspirin: Secondary | ICD-10-CM | POA: Diagnosis not present

## 2020-06-25 DIAGNOSIS — F410 Panic disorder [episodic paroxysmal anxiety] without agoraphobia: Secondary | ICD-10-CM | POA: Diagnosis not present

## 2020-06-25 DIAGNOSIS — G893 Neoplasm related pain (acute) (chronic): Secondary | ICD-10-CM | POA: Diagnosis not present

## 2020-06-25 DIAGNOSIS — Y9389 Activity, other specified: Secondary | ICD-10-CM | POA: Insufficient documentation

## 2020-06-25 DIAGNOSIS — R5382 Chronic fatigue, unspecified: Secondary | ICD-10-CM | POA: Diagnosis not present

## 2020-06-25 DIAGNOSIS — Z7951 Long term (current) use of inhaled steroids: Secondary | ICD-10-CM | POA: Diagnosis not present

## 2020-06-25 DIAGNOSIS — I25118 Atherosclerotic heart disease of native coronary artery with other forms of angina pectoris: Secondary | ICD-10-CM | POA: Diagnosis not present

## 2020-06-25 DIAGNOSIS — T18108A Unspecified foreign body in esophagus causing other injury, initial encounter: Secondary | ICD-10-CM | POA: Diagnosis not present

## 2020-06-25 DIAGNOSIS — Z79899 Other long term (current) drug therapy: Secondary | ICD-10-CM | POA: Insufficient documentation

## 2020-06-25 DIAGNOSIS — Z87891 Personal history of nicotine dependence: Secondary | ICD-10-CM | POA: Diagnosis not present

## 2020-06-25 DIAGNOSIS — Z20822 Contact with and (suspected) exposure to covid-19: Secondary | ICD-10-CM | POA: Diagnosis not present

## 2020-06-25 DIAGNOSIS — C7951 Secondary malignant neoplasm of bone: Secondary | ICD-10-CM | POA: Diagnosis not present

## 2020-06-25 DIAGNOSIS — Z7902 Long term (current) use of antithrombotics/antiplatelets: Secondary | ICD-10-CM | POA: Diagnosis not present

## 2020-06-25 DIAGNOSIS — R220 Localized swelling, mass and lump, head: Secondary | ICD-10-CM | POA: Diagnosis not present

## 2020-06-25 DIAGNOSIS — X58XXXA Exposure to other specified factors, initial encounter: Secondary | ICD-10-CM | POA: Diagnosis not present

## 2020-06-25 DIAGNOSIS — T188XXA Foreign body in other parts of alimentary tract, initial encounter: Secondary | ICD-10-CM | POA: Diagnosis present

## 2020-06-25 DIAGNOSIS — Z8249 Family history of ischemic heart disease and other diseases of the circulatory system: Secondary | ICD-10-CM | POA: Diagnosis not present

## 2020-06-25 DIAGNOSIS — S1015XA Superficial foreign body of throat, initial encounter: Secondary | ICD-10-CM | POA: Diagnosis not present

## 2020-06-25 DIAGNOSIS — T17398A Other foreign object in larynx causing other injury, initial encounter: Secondary | ICD-10-CM | POA: Diagnosis not present

## 2020-06-25 DIAGNOSIS — E785 Hyperlipidemia, unspecified: Secondary | ICD-10-CM | POA: Diagnosis not present

## 2020-06-25 DIAGNOSIS — F17220 Nicotine dependence, chewing tobacco, uncomplicated: Secondary | ICD-10-CM | POA: Diagnosis not present

## 2020-06-25 DIAGNOSIS — B379 Candidiasis, unspecified: Secondary | ICD-10-CM | POA: Diagnosis not present

## 2020-06-25 DIAGNOSIS — K219 Gastro-esophageal reflux disease without esophagitis: Secondary | ICD-10-CM | POA: Insufficient documentation

## 2020-06-25 DIAGNOSIS — T18198A Other foreign object in esophagus causing other injury, initial encounter: Secondary | ICD-10-CM | POA: Diagnosis not present

## 2020-06-25 DIAGNOSIS — Z7952 Long term (current) use of systemic steroids: Secondary | ICD-10-CM | POA: Diagnosis not present

## 2020-06-25 DIAGNOSIS — Z5309 Procedure and treatment not carried out because of other contraindication: Secondary | ICD-10-CM | POA: Insufficient documentation

## 2020-06-25 DIAGNOSIS — T17920A Food in respiratory tract, part unspecified causing asphyxiation, initial encounter: Secondary | ICD-10-CM | POA: Diagnosis not present

## 2020-06-25 DIAGNOSIS — M4802 Spinal stenosis, cervical region: Secondary | ICD-10-CM | POA: Diagnosis not present

## 2020-06-25 DIAGNOSIS — Z79891 Long term (current) use of opiate analgesic: Secondary | ICD-10-CM | POA: Insufficient documentation

## 2020-06-25 DIAGNOSIS — Z955 Presence of coronary angioplasty implant and graft: Secondary | ICD-10-CM | POA: Insufficient documentation

## 2020-06-25 DIAGNOSIS — Z741 Need for assistance with personal care: Secondary | ICD-10-CM | POA: Diagnosis not present

## 2020-06-25 DIAGNOSIS — T17208A Unspecified foreign body in pharynx causing other injury, initial encounter: Secondary | ICD-10-CM

## 2020-06-25 DIAGNOSIS — C61 Malignant neoplasm of prostate: Secondary | ICD-10-CM | POA: Diagnosis not present

## 2020-06-25 DIAGNOSIS — I10 Essential (primary) hypertension: Secondary | ICD-10-CM | POA: Diagnosis not present

## 2020-06-25 DIAGNOSIS — Z9079 Acquired absence of other genital organ(s): Secondary | ICD-10-CM | POA: Diagnosis not present

## 2020-06-25 HISTORY — PX: ESOPHAGOGASTRODUODENOSCOPY: SHX5428

## 2020-06-25 LAB — CBC WITH DIFFERENTIAL/PLATELET
Abs Immature Granulocytes: 0.19 10*3/uL — ABNORMAL HIGH (ref 0.00–0.07)
Basophils Absolute: 0 10*3/uL (ref 0.0–0.1)
Basophils Relative: 0 %
Eosinophils Absolute: 0 10*3/uL (ref 0.0–0.5)
Eosinophils Relative: 0 %
HCT: 37 % — ABNORMAL LOW (ref 39.0–52.0)
Hemoglobin: 12.3 g/dL — ABNORMAL LOW (ref 13.0–17.0)
Immature Granulocytes: 2 %
Lymphocytes Relative: 20 %
Lymphs Abs: 1.8 10*3/uL (ref 0.7–4.0)
MCH: 30.8 pg (ref 26.0–34.0)
MCHC: 33.2 g/dL (ref 30.0–36.0)
MCV: 92.7 fL (ref 80.0–100.0)
Monocytes Absolute: 0.7 10*3/uL (ref 0.1–1.0)
Monocytes Relative: 8 %
Neutro Abs: 6.3 10*3/uL (ref 1.7–7.7)
Neutrophils Relative %: 70 %
Platelets: 194 10*3/uL (ref 150–400)
RBC: 3.99 MIL/uL — ABNORMAL LOW (ref 4.22–5.81)
RDW: 15 % (ref 11.5–15.5)
WBC: 9 10*3/uL (ref 4.0–10.5)
nRBC: 0 % (ref 0.0–0.2)

## 2020-06-25 LAB — BASIC METABOLIC PANEL
Anion gap: 9 (ref 5–15)
BUN: 15 mg/dL (ref 8–23)
CO2: 26 mmol/L (ref 22–32)
Calcium: 9.3 mg/dL (ref 8.9–10.3)
Chloride: 101 mmol/L (ref 98–111)
Creatinine, Ser: 0.7 mg/dL (ref 0.61–1.24)
GFR, Estimated: >60 mL/min (ref >60)
Glucose, Bld: 92 mg/dL (ref 70–99)
Potassium: 4 mmol/L (ref 3.5–5.1)
Sodium: 136 mmol/L (ref 135–145)

## 2020-06-25 LAB — RESP PANEL BY RT-PCR (FLU A&B, COVID) ARPGX2
Influenza A by PCR: NEGATIVE
Influenza B by PCR: NEGATIVE
SARS Coronavirus 2 by RT PCR: NEGATIVE

## 2020-06-25 SURGERY — EGD (ESOPHAGOGASTRODUODENOSCOPY)
Anesthesia: General

## 2020-06-25 MED ORDER — PROPOFOL 500 MG/50ML IV EMUL
INTRAVENOUS | Status: AC
  Filled 2020-06-25: qty 50

## 2020-06-25 MED ORDER — PROPOFOL 10 MG/ML IV BOLUS
INTRAVENOUS | Status: DC | PRN
  Administered 2020-06-25: 50 mg via INTRAVENOUS

## 2020-06-25 MED ORDER — PROPOFOL 500 MG/50ML IV EMUL
INTRAVENOUS | Status: DC | PRN
  Administered 2020-06-25: 125 ug/kg/min via INTRAVENOUS

## 2020-06-25 MED ORDER — SODIUM CHLORIDE 0.9 % IV SOLN: INTRAVENOUS | Status: DC

## 2020-06-25 MED ORDER — LIDOCAINE HCL (CARDIAC) PF 100 MG/5ML IV SOSY
PREFILLED_SYRINGE | INTRAVENOUS | Status: DC | PRN
  Administered 2020-06-25: 40 mg via INTRAVENOUS

## 2020-06-25 NOTE — H&P (Signed)
Consultation  Referring Provider:     ED Admit date: 6/24 Consult date: 6/24         Reason for Consultation:   Foreign Body           HPI:   Robert Short is a 70 y.o. gentleman with history of metastatic prostate cancer on hospice services. Per patient he was changing his fentanyl patch and put one in his mouth to hold it and then fell asleep and thinks he swallowed it in his sleep. He is visibly uncomfortable. No blood thinners. Endorses neck surgeries in the past. Is being treated for thrush. History somewhat difficult to obtain from patient.  Past Medical History:  Diagnosis Date   Back pain 10/09/2012   Bone cancer ([place])    CAD (coronary artery disease)    a. 08/2015 PCI: LM nl, LAD 20d, LCX nl, RCA 75m, RPAV 95 (3.0x18 Xience Alpine DES);  b. 04/2016 PCI ([place]): RPL 95 (2.75x8 Promus Premier DES).   Cancer associated pain    Depression    History of echocardiogram    a. 08/2016 Echo: EF 50-55%.   History of kidney stones    Hyperlipidemia    Hypertension    Joint pain    Prostate cancer ([place])    a.  s/p prostatectomy ([facility]);  b. bone mets noted 04/2016.   Right arm pain 01/10/2016   Right foot pain 01/10/2016   Right leg pain 01/10/2016   Tobacco abuse     Past Surgical History:  Procedure Laterality Date   CARDIAC CATHETERIZATION     [facility]   CARDIAC CATHETERIZATION N/A 08/16/2015   Procedure: Left Heart Cath and Coronary Angiography;  Surgeon: Yolonda Kida, MD;  Location: [facility];  Service: Cardiovascular;  Laterality: N/A;   CARDIAC CATHETERIZATION N/A 08/16/2015   Procedure: Coronary Stent Intervention;  Surgeon: Yolonda Kida, MD;  Location: [facility];  Service: Cardiovascular;  Laterality: N/A;   CERVIX SURGERY     PROSTATECTOMY     SPINE SURGERY     TONSILLECTOMY     WRIST SURGERY      Family History  Problem Relation Age of Onset   Heart attack Mother    Hypertension Mother    Heart attack Father    Social History    Tobacco Use   Smoking status: Former    Packs/day: 0.50    Years: 40.00    Pack years: 20.00    Types: Cigarettes   Smokeless tobacco: Current    Types: Chew   Tobacco comments:    1 pack every couple days   Vaping Use   Vaping Use: Never used  Substance Use Topics   Alcohol use: Yes    Comment: occasionally   Drug use: No    Prior to Admission medications   Medication Sig Start Date End Date Taking? Authorizing Provider  acetaminophen (TYLENOL) 500 MG tablet Take 1,000 mg by mouth every 8 (eight) hours as needed.  12/03/12   [provider]  albuterol (PROVENTIL HFA;VENTOLIN HFA) 108 (90 Base) MCG/ACT inhaler Inhale 1-2 puffs into the lungs every 6 (six) hours as needed for wheezing or shortness of breath. 08/10/16   Mikey College, NP  amoxicillin-clavulanate (AUGMENTIN) 875-125 MG tablet Take 1 tablet by mouth 2 (two) times daily for 10 days. 06/11/20   Borders, Kirt Boys, NP  aspirin 81 MG tablet Take 81 mg by mouth daily.    [provider]  atorvastatin (LIPITOR)  80 MG tablet Take 1 tablet (80 mg total) by mouth daily at 6 PM. Patient not taking: Reported on 03/24/2020 01/15/19   Loel Dubonnet, NP  clopidogrel (PLAVIX) 75 MG tablet Take 1 tablet (75 mg total) by mouth daily. Patient not taking: Reported on 03/24/2020 01/15/19   Loel Dubonnet, NP  dexamethasone (DECADRON) 4 MG tablet TAKE 1 TABLET BY MOUTH TWICE DAILY WITH A MEAL 12/25/19 12/24/20  Borders, Kirt Boys, NP  dexamethasone (DECADRON) 4 MG tablet TAKE 1 TABLET BY MOUTH TWICE DAILY WITH A MEAL. 04/30/20 04/30/21  Borders, Kirt Boys, NP  fentaNYL (DURAGESIC) 100 MCG/HR PLACE 1 PATCH ONTO THE SKIN EVERY OTHER DAY. ALONG WITH 50MCG PATCH FOR TOTAL OF 150 MCG EVERY 2 DAYS. 06/10/20 12/07/20  Borders, Kirt Boys, NP  fentaNYL (DURAGESIC) 50 MCG/HR PLACE 1 PATCH ONTO THE SKIN EVERY OTHER DAY. ALONG WITH 100MCG PATCH FOR TOTAL OF 150 MCG EVERY 2 DAYS. 06/10/20 12/07/20  Borders, Kirt Boys, NP  gabapentin  (NEURONTIN) 300 MG capsule TAKE 1 CAPSULE BY MOUTH 3 TIMES DAILY 03/18/20 03/18/21  Borders, Kirt Boys, NP  guaiFENesin (MUCINEX) 600 MG 12 hr tablet Take 1 tablet (600 mg total) by mouth 2 (two) times daily. 06/11/20   Borders, Kirt Boys, NP  ipratropium-albuterol (DUONEB) 0.5-2.5 (3) MG/3ML SOLN Take 3 mLs by nebulization every 6 (six) hours as needed for shortness of breath or wheezing. 06/11/20   Borders, Kirt Boys, NP  LORazepam (ATIVAN) 0.5 MG tablet TAKE 1 TABLET BY MOUTH EVERY 4 HOURS AS NEEDED FOR ANXIETY. 06/17/20 12/14/20  Borders, Kirt Boys, NP  magic mouthwash SOLN Take 5 mLs by mouth 4 (four) times daily. 10/03/19   [name], MD  metoprolol tartrate (LOPRESSOR) 25 MG tablet TAKE 1 TABLET (25 MG TOTAL) BY MOUTH 2 (TWO) TIMES DAILY. 05/24/20 05/24/21  [name], MD  Morphine Sulfate (MORPHINE CONCENTRATE) 10 mg / 0.5 ml concentrated solution TAKE 0.25-0.5 MLS (5-10 MG TOTAL) BY MOUTH EVERY 2 HOURS AS NEEDED FOR SEVERE PAIN. 03/18/20 09/14/20  Borders, Kirt Boys, NP  naloxone [facility]) nasal spray 4 mg/0.1 mL 1 spray into nostril upon signs of opioid overdose. Call 911. May repeat once if no response within 2-3 minutes. 05/07/18   Borders, Kirt Boys, NP  nitroGLYCERIN (NITROSTAT) 0.4 MG SL tablet Place 1 tablet (0.4 mg total) under the tongue every 5 (five) minutes as needed. Patient not taking: Reported on 03/24/2020 01/15/19   Loel Dubonnet, NP  nystatin (MYCOSTATIN) 100000 UNIT/ML suspension TAKE 5 ML BY MOUTH 3 (THREE) TIMES DAILY. 05/14/20 05/14/21  Jacquelin Hawking, NP  ondansetron (ZOFRAN) 8 MG tablet Take 8 mg by mouth every 8 (eight) hours as needed for nausea or vomiting.    [provider]  oxyCODONE (ROXICODONE) 15 MG immediate release tablet TAKE 1 TABLET BY MOUTH EVERY 4 (FOUR) HOURS AS NEEDED FOR PAIN. 06/10/20 12/07/20  Borders, Kirt Boys, NP  polyethylene glycol (MIRALAX / GLYCOLAX) packet Take 17 g by mouth daily as needed for moderate constipation.     [provider]  promethazine (PHENERGAN) 25 MG tablet TAKE 1/2 TO 1 TABLET (12.5-25 MG TOTAL) BY MOUTH EVERY 8 (EIGHT) HOURS AS NEEDED FOR NAUSEA OR VOMITING. 03/28/19   [name], MD  promethazine (PHENERGAN) 25 MG tablet TAKE 1 TABLET BY MOUTH EVERY 8 HOURS AS NEEDED NAUSEA AND/OR VOMITING 03/02/20 03/02/21  Borders, Kirt Boys, NP  promethazine (PHENERGAN) 25 MG tablet TAKE 1 TABLET BY MOUTH EVERY 8 HOURS  AS NEEDED FOR NAUSEA AND/OR VOMITING 12/11/19 12/10/20  [name], MD  traZODone (DESYREL) 50 MG tablet TAKE 1 TO 2 TABLETS BY MOUTH AT BEDTIME AS NEEDED FOR SLEEP. 03/24/20 03/24/21  [name], MD    Current Facility-Administered Medications  Medication Dose Route Frequency Provider Last Rate Last Admin   0.9 %  sodium chloride infusion   Intravenous Continuous Lesly Rubenstein, MD 10 mL/hr at 06/25/20 1004 New Bag at 06/25/20 1004    Allergies as of 06/25/2020 - Review Complete 06/25/2020  Allergen Reaction Noted   Ditropan [oxybutynin] Other (See Comments) 06/26/2017     Review of Systems:    All systems reviewed and negative except where noted in HPI.  Review of Systems  Constitutional:  Positive for malaise/fatigue. Negative for chills and fever.  Respiratory:  Negative for shortness of breath.   Cardiovascular:  Negative for chest pain.  Gastrointestinal:  Positive for nausea and vomiting. Negative for blood in stool and melena.  Musculoskeletal:  Positive for back pain, falls and joint pain.  Skin:  Negative for itching and rash.  Neurological:  Positive for weakness.  Psychiatric/Behavioral:  Negative for substance abuse.   All other systems reviewed and are negative.    Physical Exam:  Vital signs in last 24 hours: Temp:  [96.7 F (35.9 C)] 96.7 F (35.9 C) (06/24 0956) Pulse Rate:  [62-74] 64 (06/24 0956) Resp:  [18-20] 20 (06/24 0956) BP: (186-199)/(96-113) 186/96 (06/24 0956) SpO2:  [95 %-99 %] 98 % (06/24 0956) Weight:  [64.9  kg-65.8 kg] 64.9 kg (06/24 0956)   General:   In moderate distress Head:  Normocephalic and atraumatic. Eyes:   No icterus.   Conjunctiva pink. Ears:  Normal auditory acuity. Mouth: Mucosa pink moist, no lesions. Neck:  Supple; no masses felt Lungs:  No respiratory distress Heart:  RRR Abdomen:   Non-distended Rectal:  Not performed.  Msk:  MAEW x4, No clubbing or cyanosis Neurologic:  No focal deficits Skin:  Warm, dry, pink without significant lesions or rashes. Psych:  Alert and cooperative  LAB RESULTS: Recent Labs    06/25/20 0810  WBC 9.0  HGB 12.3*  HCT 37.0*  PLT 194   BMET Recent Labs    06/25/20 0810  NA 136  K 4.0  CL 101  CO2 26  GLUCOSE 92  BUN 15  CREATININE 0.70  CALCIUM 9.3   LFT No results for input(s): PROT, ALBUMIN, AST, ALT, ALKPHOS, BILITOT, BILIDIR, IBILI in the last 72 hours. PT/INR No results for input(s): LABPROT, INR in the last 72 hours.  STUDIES: No results found.     Impression / Plan:   70 y/o gentleman with metastatic prostate cancer on home hospice here with possible incidental ingestion of fentanyl patch.  - will plan for EGD - further recs after procedure  Raylene Miyamoto MD, MPH [place]

## 2020-06-25 NOTE — ED Triage Notes (Signed)
Pt arrives to ED from home via [facility] with c/c of fentanyl patch stuck in back of throat. Pt states he fell asleep with it in his mouth and awoke to feel it stuck in the back of his throat. He states that initially he felt like it was blocking his airway but now his airway has cleared. He states "I still feel it flapping around back there". EMS reports transport vitals of 180/110, p80, Spo2 95% on room air. Upon arrival, pt A&Ox4, NAD. Unable to visualize patch at time of assessment.

## 2020-06-25 NOTE — ED Notes (Signed)
Pt taken for endoscopy

## 2020-06-25 NOTE — Op Note (Signed)
[facility] Patient Name: Robert Short Procedure Date: 06/25/2020 11:15 AM MRN: [id number] Account #: [id number] Date of Birth: [birth year] Admit Type: Outpatient Age: 70 Room: [facility] ENDO ROOM 3 Gender: Male Note Status: Finalized Procedure:             Upper GI endoscopy Indications:           Foreign body in the esophagus Providers:             Andrey Farmer MD, MD Medicines:             Monitored Anesthesia Care Complications:         No immediate complications. Procedure:             Pre-Anesthesia Assessment:                        - Prior to the procedure, a History and Physical was                         performed, and patient medications and allergies were                         reviewed. The patient is competent. The risks and                         benefits of the procedure and the sedation options and                         risks were discussed with the patient. All questions                         were answered and informed consent was obtained.                         Patient identification and proposed procedure were                         verified by the physician, the nurse, the anesthetist                         and the technician in the endoscopy suite. Mental                         Status Examination: alert and oriented. Airway                         Examination: normal oropharyngeal airway and neck                         mobility. Respiratory Examination: clear to                         auscultation. CV Examination: normal. Prophylactic                         Antibiotics: The patient does not require prophylactic                         antibiotics. Prior Anticoagulants: The patient has  taken no previous anticoagulant or antiplatelet                         agents. ASA Grade Assessment: IV - A patient with                         severe systemic disease that is a constant threat to                          life. After reviewing the risks and benefits, the                         patient was deemed in satisfactory condition to                         undergo the procedure. The anesthesia plan was to use                         monitored anesthesia care (MAC). Immediately prior to                         administration of medications, the patient was                         re-assessed for adequacy to receive sedatives. The                         heart rate, respiratory rate, oxygen saturations,                         blood pressure, adequacy of pulmonary ventilation, and                         response to care were monitored throughout the                         procedure. The physical status of the patient was                         re-assessed after the procedure.                        After obtaining informed consent, the endoscope was                         passed under direct vision. Throughout the procedure,                         the patient's blood pressure, pulse, and oxygen                         saturations were monitored continuously. The Endoscope                         was introduced through the mouth, with the intention                         of advancing to the duodenum. The scope was advanced  to the gastric fundus before the procedure was                         aborted. Medications were given. The procedure was                         aborted due to presence of food. Findings:      A fentanyl patch was found covering the vocal cords. A forceps was used       to remove the patch intact.      White nummular lesions were noted in the entire esophagus.      A large amount of food (residue) was found in the gastric fundus. Impression:            - The procedure was aborted due to presence of food.                        - White nummular lesions in esophageal mucosa. Patient                         already on treatment for candida.                         - A large amount of food (residue) in the stomach.                        - No specimens collected. Recommendation:        - Discharge patient to home.                        - Resume previous diet.                        - Continue present medications.                        - Return to primary care physician as previously                         scheduled. Procedure Code(s):     --- Professional ---                        [phone], 52, Esophagogastroduodenoscopy, flexible,                         transoral; diagnostic, including collection of                         specimen(s) by brushing or washing, when performed                         (separate procedure) Diagnosis Code(s):     --- Professional ---                        K22.8, Other specified diseases of esophagus                        T18.108A, Unspecified foreign body in esophagus  causing other injury, initial encounter CPT copyright 2019 [employer]. All rights reserved. The codes documented in this report are preliminary and upon coder review may  be revised to meet current compliance requirements. Andrey Farmer MD, MD 06/25/2020 11:45:20 AM Number of Addenda: 0 Note Initiated On: 06/25/2020 11:15 AM Estimated Blood Loss:  Estimated blood loss: none.      [facility]

## 2020-06-25 NOTE — Care Plan (Signed)
Fentanyl patch removed from throat. It was not in the esophagus. Ok to be discharged home but will need to follow-up with team who prescribes his patches as I question whether if this was accidental.  Raylene Miyamoto MD, MPH [place]

## 2020-06-25 NOTE — ED Provider Notes (Signed)
[facility] Emergency Department Provider Note  ____________________________________________   Event Date/Time   First MD Initiated Contact with Patient 06/25/20 [phone]     (approximate)  I have reviewed the triage vital signs and the nursing notes.   HISTORY  Chief Complaint Choking    HPI Robert Short is a 70 y.o. male with metastatic prostate cancer who comes in with concerns for fentanyl patch in his throat.  Patient states that he was changing his fentanyl patch and he was holding it in his mouth when he woke up and the patch was no longer there.  He states he already put a new patch on his left arm.  He states that he is pretty sure that he swallowed the patch.  He states that he did not leave it anywhere on the ground.  He has a sensation of its been constant since just prior to arrival, nothing makes it better, nothing makes it worse.  He does report being treated for thrush          Past Medical History:  Diagnosis Date   Back pain 10/09/2012   Bone cancer ([place])    CAD (coronary artery disease)    a. 08/2015 PCI: LM nl, LAD 20d, LCX nl, RCA 76m, RPAV 95 (3.0x18 Xience Alpine DES);  b. 04/2016 PCI ([place]): RPL 95 (2.75x8 Promus Premier DES).   Cancer associated pain    Depression    History of echocardiogram    a. 08/2016 Echo: EF 50-55%.   History of kidney stones    Hyperlipidemia    Hypertension    Joint pain    Prostate cancer ([place])    a.  s/p prostatectomy ([facility]);  b. bone mets noted 04/2016.   Right arm pain 01/10/2016   Right foot pain 01/10/2016   Right leg pain 01/10/2016   Tobacco abuse     Patient Active Problem List   Diagnosis Date Noted   Goals of care, counseling/discussion 10/18/2018   Severe major depression, single episode, without psychotic features ([place]) 05/01/2018   Suicidal ideation 04/30/2018   Reaction, adjustment, with anxious, depressed mood 04/30/2018   Chest pain 08/02/2016   Pre-diabetes 07/18/2016    Stable angina ([place]) 04/21/2016   Nausea 01/17/2016   History of ST elevation myocardial infarction (STEMI) 08/16/2015   Neoplasm related pain 06/22/2015   Cancer associated pain 02/04/2015   Sebaceous cyst 01/18/2015   Chronic neck pain 12/08/2014   Cervical spondylosis 12/08/2014   Chronic low back pain 12/08/2014   Lumbar spondylosis 12/08/2014   Chronic shoulder pain 12/08/2014   Prostate cancer metastatic to bone ([place]) 12/08/2014   Cancer-related pain 12/08/2014   Neurogenic pain 12/08/2014   Musculoskeletal pain 12/08/2014   Chronic pain 12/07/2014   Long term current use of opiate analgesic 12/07/2014   Long term prescription opiate use 12/07/2014   Opiate use 12/07/2014   Encounter for therapeutic drug level monitoring 12/07/2014   Encounter for chronic pain management 12/07/2014   Therapeutic opioid-induced constipation (OIC) 12/07/2014   Fatigue 08/27/2014   CFIDS (chronic fatigue and immune dysfunction syndrome) ([place]) 06/04/2014   Cervical spinal stenosis 06/04/2014   Clinical depression 06/04/2014   Essential (primary) hypertension 06/04/2014   HLD (hyperlipidemia) 06/04/2014   Amnesia 06/04/2014   Chronic cervical pain 06/04/2014   Neuropathy ([place]) 06/04/2014   Loss of feeling or sensation 06/04/2014   Pain in shoulder 06/04/2014   Compulsive tobacco user syndrome 06/04/2014   CAD (coronary artery disease) 10/09/2012  Smoker 10/09/2012   ED (erectile dysfunction) of organic origin 08/08/2012   Acid reflux 08/08/2012   Lower urinary tract symptoms 08/08/2012   Restless leg 08/01/2010   Back ache 08/01/2010    Past Surgical History:  Procedure Laterality Date   CARDIAC CATHETERIZATION     [facility]   CARDIAC CATHETERIZATION N/A 08/16/2015   Procedure: Left Heart Cath and Coronary Angiography;  Surgeon: Yolonda Kida, MD;  Location: [place] [facility];  Service: Cardiovascular;  Laterality: N/A;   CARDIAC CATHETERIZATION N/A 08/16/2015   Procedure: Coronary  Stent Intervention;  Surgeon: Yolonda Kida, MD;  Location: [place] [facility];  Service: Cardiovascular;  Laterality: N/A;   CERVIX SURGERY     PROSTATECTOMY     SPINE SURGERY     TONSILLECTOMY     WRIST SURGERY      Prior to Admission medications   Medication Sig Start Date End Date Taking? Authorizing Provider  acetaminophen (TYLENOL) 500 MG tablet Take 1,000 mg by mouth every 8 (eight) hours as needed.  12/03/12   [provider]  albuterol (PROVENTIL HFA;VENTOLIN HFA) 108 (90 Base) MCG/ACT inhaler Inhale 1-2 puffs into the lungs every 6 (six) hours as needed for wheezing or shortness of breath. 08/10/16   Mikey College, NP  amoxicillin-clavulanate (AUGMENTIN) 875-125 MG tablet Take 1 tablet by mouth 2 (two) times daily for 10 days. 06/11/20   Borders, Kirt Boys, NP  aspirin 81 MG tablet Take 81 mg by mouth daily.    [provider]  atorvastatin (LIPITOR) 80 MG tablet Take 1 tablet (80 mg total) by mouth daily at 6 PM. Patient not taking: Reported on 03/24/2020 01/15/19   Loel Dubonnet, NP  clopidogrel (PLAVIX) 75 MG tablet Take 1 tablet (75 mg total) by mouth daily. Patient not taking: Reported on 03/24/2020 01/15/19   Loel Dubonnet, NP  dexamethasone (DECADRON) 4 MG tablet TAKE 1 TABLET BY MOUTH TWICE DAILY WITH A MEAL 12/25/19 12/24/20  Borders, Kirt Boys, NP  dexamethasone (DECADRON) 4 MG tablet TAKE 1 TABLET BY MOUTH TWICE DAILY WITH A MEAL. 04/30/20 04/30/21  Borders, Kirt Boys, NP  fentaNYL (DURAGESIC) 100 MCG/HR PLACE 1 PATCH ONTO THE SKIN EVERY OTHER DAY. ALONG WITH 50MCG PATCH FOR TOTAL OF 150 MCG EVERY 2 DAYS. 06/10/20 12/07/20  Borders, Kirt Boys, NP  fentaNYL (DURAGESIC) 50 MCG/HR PLACE 1 PATCH ONTO THE SKIN EVERY OTHER DAY. ALONG WITH 100MCG PATCH FOR TOTAL OF 150 MCG EVERY 2 DAYS. 06/10/20 12/07/20  Borders, Kirt Boys, NP  gabapentin (NEURONTIN) 300 MG capsule TAKE 1 CAPSULE BY MOUTH 3 TIMES DAILY 03/18/20 03/18/21  Borders, Kirt Boys, NP  guaiFENesin  (MUCINEX) 600 MG 12 hr tablet Take 1 tablet (600 mg total) by mouth 2 (two) times daily. 06/11/20   Borders, Kirt Boys, NP  ipratropium-albuterol (DUONEB) 0.5-2.5 (3) MG/3ML SOLN Take 3 mLs by nebulization every 6 (six) hours as needed for shortness of breath or wheezing. 06/11/20   Borders, Kirt Boys, NP  LORazepam (ATIVAN) 0.5 MG tablet TAKE 1 TABLET BY MOUTH EVERY 4 HOURS AS NEEDED FOR ANXIETY. 06/17/20 12/14/20  Borders, Kirt Boys, NP  magic mouthwash SOLN Take 5 mLs by mouth 4 (four) times daily. 10/03/19   [name], MD  metoprolol tartrate (LOPRESSOR) 25 MG tablet TAKE 1 TABLET (25 MG TOTAL) BY MOUTH 2 (TWO) TIMES DAILY. 05/24/20 05/24/21  [name], MD  Morphine Sulfate (MORPHINE CONCENTRATE) 10 mg / 0.5 ml concentrated solution TAKE 0.25-0.5 MLS (  5-10 MG TOTAL) BY MOUTH EVERY 2 HOURS AS NEEDED FOR SEVERE PAIN. 03/18/20 09/14/20  Borders, Kirt Boys, NP  naloxone [facility]) nasal spray 4 mg/0.1 mL 1 spray into nostril upon signs of opioid overdose. Call 911. May repeat once if no response within 2-3 minutes. 05/07/18   Borders, Kirt Boys, NP  nitroGLYCERIN (NITROSTAT) 0.4 MG SL tablet Place 1 tablet (0.4 mg total) under the tongue every 5 (five) minutes as needed. Patient not taking: Reported on 03/24/2020 01/15/19   Loel Dubonnet, NP  nystatin (MYCOSTATIN) 100000 UNIT/ML suspension TAKE 5 ML BY MOUTH 3 (THREE) TIMES DAILY. 05/14/20 05/14/21  Jacquelin Hawking, NP  ondansetron (ZOFRAN) 8 MG tablet Take 8 mg by mouth every 8 (eight) hours as needed for nausea or vomiting.    [provider]  oxyCODONE (ROXICODONE) 15 MG immediate release tablet TAKE 1 TABLET BY MOUTH EVERY 4 (FOUR) HOURS AS NEEDED FOR PAIN. 06/10/20 12/07/20  Borders, Kirt Boys, NP  polyethylene glycol (MIRALAX / GLYCOLAX) packet Take 17 g by mouth daily as needed for moderate constipation.     [provider]  promethazine (PHENERGAN) 25 MG tablet TAKE 1/2 TO 1 TABLET (12.5-25 MG TOTAL) BY MOUTH EVERY 8  (EIGHT) HOURS AS NEEDED FOR NAUSEA OR VOMITING. 03/28/19   [name], MD  promethazine (PHENERGAN) 25 MG tablet TAKE 1 TABLET BY MOUTH EVERY 8 HOURS AS NEEDED NAUSEA AND/OR VOMITING 03/02/20 03/02/21  Borders, Kirt Boys, NP  promethazine (PHENERGAN) 25 MG tablet TAKE 1 TABLET BY MOUTH EVERY 8 HOURS AS NEEDED FOR NAUSEA AND/OR VOMITING 12/11/19 12/10/20  [name], MD  traZODone (DESYREL) 50 MG tablet TAKE 1 TO 2 TABLETS BY MOUTH AT BEDTIME AS NEEDED FOR SLEEP. 03/24/20 03/24/21  [name], MD    Allergies Ditropan [oxybutynin]  Family History  Problem Relation Age of Onset   Heart attack Mother    Hypertension Mother    Heart attack Father     Social History Social History   Tobacco Use   Smoking status: Former    Packs/day: 0.50    Years: 40.00    Pack years: 20.00    Types: Cigarettes   Smokeless tobacco: Current    Types: Chew   Tobacco comments:    1 pack every couple days   Vaping Use   Vaping Use: Never used  Substance Use Topics   Alcohol use: Yes    Comment: occasionally   Drug use: No      Review of Systems Constitutional: No fever/chills Eyes: No visual changes. ENT: Positive scratchiness in his throat Cardiovascular: Denies chest pain. Respiratory: Denies shortness of breath. Gastrointestinal: No abdominal pain.  No nausea, no vomiting.  No diarrhea.  No constipation. Genitourinary: Negative for dysuria. Musculoskeletal: Negative for back pain. Skin: Negative for rash. Neurological: Negative for headaches, focal weakness or numbness. All other ROS negative ____________________________________________   PHYSICAL EXAM:  VITAL SIGNS: ED Triage Vitals  Enc Vitals Group     BP 06/25/20 0713 (!) 199/113     Pulse Rate 06/25/20 0713 74     Resp 06/25/20 0713 18     Temp --      Temp src --      SpO2 06/25/20 0713 95 %     Weight 06/25/20 0706 145 lb (65.8 kg)     Height 06/25/20 0706 5\' 11"  (1.803 m)     Head  Circumference --      Peak Flow --  Pain Score 06/25/20 0706 0     Pain Loc --      Pain Edu? --      Excl. in [place]? --     Constitutional: Alert and oriented. Well appearing and in no acute distress. Eyes: Conjunctivae are normal. EOMI. Head: Atraumatic. Nose: No congestion/rhinnorhea. Mouth/Throat: Some mild amount of thrush noted on the tongue.  Oropharynx is clear.  Able to visualize the uvula.  No foreign body visualized. Neck: No stridor. Trachea Midline. FROM Cardiovascular: Normal rate, regular rhythm. Grossly normal heart sounds.  Good peripheral circulation. Respiratory: Normal respiratory effort.  No retractions. Lungs CTAB. Gastrointestinal: Soft and nontender. No distention. No abdominal bruits.  Musculoskeletal: No lower extremity tenderness nor edema.  No joint effusions. Neurologic:  Normal speech and language. No gross focal neurologic deficits are appreciated.  Skin:  Skin is warm, dry and intact. No rash noted. Psychiatric: Mood and affect are normal. Speech and behavior are normal. GU: Deferred   ____________________________________________   LABS (all labs ordered are listed, but only abnormal results are displayed)  Labs Reviewed  CBC WITH DIFFERENTIAL/PLATELET - Abnormal; Notable for the following components:      Result Value   RBC 3.99 (*)    Hemoglobin 12.3 (*)    HCT 37.0 (*)    Abs Immature Granulocytes 0.19 (*)    All other components within normal limits  RESP PANEL BY RT-PCR (FLU A&B, COVID) ARPGX2  BASIC METABOLIC PANEL   ____________________________________________    PROCEDURES  Procedure(s) performed (including Critical Care):  Procedures   ____________________________________________   INITIAL IMPRESSION / ASSESSMENT AND PLAN / ED COURSE  Guadalupe Nickless Escoto was evaluated in Emergency Department on 06/25/2020 for the symptoms described in the history of present illness. He was evaluated in the context of the global COVID-19  pandemic, which necessitated consideration that the patient might be at risk for infection with the SARS-CoV-2 virus that causes COVID-19. Institutional protocols and algorithms that pertain to the evaluation of patients at risk for COVID-19 are in a state of rapid change based on information released by regulatory bodies including the CDC and federal and state organizations. These policies and algorithms were followed during the patient's care in the ED.    Patient is a 70 year old who comes in with a sensation that he swallowed something and something stuck in the back of his throat.  On my assessment I do not see anything that I can pull out.  He is not in any respiratory distress I lower suspicion that it is in his trachea more concerned that can be in his esophagus.  Will discuss with GI.  Patient states that it was accidental.  He is on chronic pain meds from metastatic cancer   8:13 AM D/w Dr. Haig Prophet who recommends talking to pharmacy.  We discussed with pharmacy who recommends talking to poison control.  Poison control recommended endoscopy and if he cannot really remove needs to have a 24-hour observation..  Patient was admitted to GI for removal and if unable to remove they will admit to the hospital team  Although patient is adamant that it was accidental I did message his oncologist and his palliative care provider so they are aware that this happened.    ____________________________________________   FINAL CLINICAL IMPRESSION(S) / ED DIAGNOSES   Final diagnoses:  Foreign body in throat, initial encounter      MEDICATIONS GIVEN DURING THIS VISIT:  Medications  0.9 %  sodium chloride infusion ( Intravenous Anesthesia  Volume Adjustment 06/25/20 1139)     ED Discharge Orders     None        Note:  This document was prepared using Dragon voice recognition software and may include unintentional dictation errors.    Vanessa [employer], MD 06/25/20 [phone]

## 2020-06-25 NOTE — Anesthesia Postprocedure Evaluation (Signed)
Anesthesia Post Note  Patient: Robert Short  Procedure(s) Performed: ESOPHAGOGASTRODUODENOSCOPY (EGD)  Patient location during evaluation: Endoscopy Anesthesia Type: General Level of consciousness: awake and alert Pain management: pain level controlled Vital Signs Assessment: post-procedure vital signs reviewed and stable Respiratory status: spontaneous breathing and respiratory function stable Cardiovascular status: stable Anesthetic complications: no   No notable events documented.   Last Vitals:  Vitals:   06/25/20 1142 06/25/20 1144  BP: 140/86 140/86  Pulse: 62 60  Resp: 11 11  Temp: (!) 36 C   SpO2: 97% 98%    Last Pain:  Vitals:   06/25/20 1142  TempSrc: Temporal  PainSc: Asleep                 Jimma Ortman K

## 2020-06-25 NOTE — ED Notes (Signed)
Contacted poison control for recommendations- they state if patch is not able to be removed from GI tract to give a dose of charcoal and admit for 24 hours- Dr Jari Pigg aware of recommendations

## 2020-06-25 NOTE — Anesthesia Preprocedure Evaluation (Signed)
Anesthesia Evaluation  Patient identified by MRN, date of birth, ID band Patient awake    Reviewed: Allergy & Precautions, NPO status , Patient's Chart, lab work & pertinent test results  History of Anesthesia Complications Negative for: history of anesthetic complications  Airway Mallampati: III       Dental   Pulmonary neg sleep apnea, neg COPD, Not current smoker, former smoker,           Cardiovascular hypertension, Pt. on medications + CAD, + Past MI and + Cardiac Stents  (-) CHF (-) dysrhythmias (-) Valvular Problems/Murmurs     Neuro/Psych neg Seizures Depression    GI/Hepatic Neg liver ROS, GERD  Medicated,  Endo/Other  neg diabetes  Renal/GU negative Renal ROS     Musculoskeletal   Abdominal   Peds  Hematology   Anesthesia Other Findings   Reproductive/Obstetrics                             Anesthesia Physical Anesthesia Plan  ASA: 3 and emergent  Anesthesia Plan: General   Post-op Pain Management:    Induction: Intravenous  PONV Risk Score and Plan: 2 and Propofol infusion and TIVA  Airway Management Planned: Nasal Cannula  Additional Equipment:   Intra-op Plan:   Post-operative Plan:   Informed Consent: I have reviewed the patients History and Physical, chart, labs and discussed the procedure including the risks, benefits and alternatives for the proposed anesthesia with the patient or authorized representative who has indicated his/her understanding and acceptance.       Plan Discussed with:   Anesthesia Plan Comments:         Anesthesia Quick Evaluation

## 2020-06-25 NOTE — Interval H&P Note (Signed)
History and Physical Interval Note:  06/25/2020 11:26 AM  Robert Short  has presented today for surgery, with the diagnosis of Foreign Body.  The various methods of treatment have been discussed with the patient and family. After consideration of risks, benefits and other options for treatment, the patient has consented to  Procedure(s): ESOPHAGOGASTRODUODENOSCOPY (EGD) (N/A) as a surgical intervention.  The patient's history has been reviewed, patient examined, no change in status, stable for surgery.  I have reviewed the patient's chart and labs.  Questions were answered to the patient's satisfaction.     Lesly Rubenstein  Ok to proceed with EGD

## 2020-06-25 NOTE — Transfer of Care (Signed)
Immediate Anesthesia Transfer of Care Note  Patient: Robert Short  Procedure(s) Performed: ESOPHAGOGASTRODUODENOSCOPY (EGD)  Patient Location: PACU  Anesthesia Type:General  Level of Consciousness: drowsy  Airway & Oxygen Therapy: Patient Spontanous Breathing and Patient connected to nasal cannula oxygen  Post-op Assessment: Report given to RN and Post -op Vital signs reviewed and stable  Post vital signs: Reviewed and stable  Last Vitals:  Vitals Value Taken Time  BP 140/86 06/25/20 1144  Temp    Pulse 60 06/25/20 1144  Resp 11 06/25/20 1144  SpO2 98 % 06/25/20 1144    Last Pain:  Vitals:   06/25/20 0956  TempSrc: Temporal  PainSc: 2          Complications: No notable events documented.

## 2020-06-26 DIAGNOSIS — R220 Localized swelling, mass and lump, head: Secondary | ICD-10-CM | POA: Diagnosis not present

## 2020-06-26 DIAGNOSIS — Z9079 Acquired absence of other genital organ(s): Secondary | ICD-10-CM | POA: Diagnosis not present

## 2020-06-26 DIAGNOSIS — R5382 Chronic fatigue, unspecified: Secondary | ICD-10-CM | POA: Diagnosis not present

## 2020-06-26 DIAGNOSIS — Z741 Need for assistance with personal care: Secondary | ICD-10-CM | POA: Diagnosis not present

## 2020-06-26 DIAGNOSIS — F410 Panic disorder [episodic paroxysmal anxiety] without agoraphobia: Secondary | ICD-10-CM | POA: Diagnosis not present

## 2020-06-26 DIAGNOSIS — Z87891 Personal history of nicotine dependence: Secondary | ICD-10-CM | POA: Diagnosis not present

## 2020-06-26 DIAGNOSIS — K921 Melena: Secondary | ICD-10-CM | POA: Diagnosis not present

## 2020-06-26 DIAGNOSIS — K640 First degree hemorrhoids: Secondary | ICD-10-CM | POA: Diagnosis not present

## 2020-06-26 DIAGNOSIS — C61 Malignant neoplasm of prostate: Secondary | ICD-10-CM | POA: Diagnosis not present

## 2020-06-26 DIAGNOSIS — M4802 Spinal stenosis, cervical region: Secondary | ICD-10-CM | POA: Diagnosis not present

## 2020-06-26 DIAGNOSIS — F17220 Nicotine dependence, chewing tobacco, uncomplicated: Secondary | ICD-10-CM | POA: Diagnosis not present

## 2020-06-27 DIAGNOSIS — Z9079 Acquired absence of other genital organ(s): Secondary | ICD-10-CM | POA: Diagnosis not present

## 2020-06-27 DIAGNOSIS — Z741 Need for assistance with personal care: Secondary | ICD-10-CM | POA: Diagnosis not present

## 2020-06-27 DIAGNOSIS — Z87891 Personal history of nicotine dependence: Secondary | ICD-10-CM | POA: Diagnosis not present

## 2020-06-27 DIAGNOSIS — F410 Panic disorder [episodic paroxysmal anxiety] without agoraphobia: Secondary | ICD-10-CM | POA: Diagnosis not present

## 2020-06-27 DIAGNOSIS — M4802 Spinal stenosis, cervical region: Secondary | ICD-10-CM | POA: Diagnosis not present

## 2020-06-27 DIAGNOSIS — R220 Localized swelling, mass and lump, head: Secondary | ICD-10-CM | POA: Diagnosis not present

## 2020-06-27 DIAGNOSIS — R5382 Chronic fatigue, unspecified: Secondary | ICD-10-CM | POA: Diagnosis not present

## 2020-06-27 DIAGNOSIS — F17220 Nicotine dependence, chewing tobacco, uncomplicated: Secondary | ICD-10-CM | POA: Diagnosis not present

## 2020-06-27 DIAGNOSIS — C61 Malignant neoplasm of prostate: Secondary | ICD-10-CM | POA: Diagnosis not present

## 2020-06-28 DIAGNOSIS — M4802 Spinal stenosis, cervical region: Secondary | ICD-10-CM | POA: Diagnosis not present

## 2020-06-28 DIAGNOSIS — R220 Localized swelling, mass and lump, head: Secondary | ICD-10-CM | POA: Diagnosis not present

## 2020-06-28 DIAGNOSIS — R5382 Chronic fatigue, unspecified: Secondary | ICD-10-CM | POA: Diagnosis not present

## 2020-06-28 DIAGNOSIS — F410 Panic disorder [episodic paroxysmal anxiety] without agoraphobia: Secondary | ICD-10-CM | POA: Diagnosis not present

## 2020-06-28 DIAGNOSIS — Z741 Need for assistance with personal care: Secondary | ICD-10-CM | POA: Diagnosis not present

## 2020-06-28 DIAGNOSIS — F17220 Nicotine dependence, chewing tobacco, uncomplicated: Secondary | ICD-10-CM | POA: Diagnosis not present

## 2020-06-28 DIAGNOSIS — C61 Malignant neoplasm of prostate: Secondary | ICD-10-CM | POA: Diagnosis not present

## 2020-06-28 DIAGNOSIS — Z87891 Personal history of nicotine dependence: Secondary | ICD-10-CM | POA: Diagnosis not present

## 2020-06-28 DIAGNOSIS — Z9079 Acquired absence of other genital organ(s): Secondary | ICD-10-CM | POA: Diagnosis not present

## 2020-06-29 ENCOUNTER — Other Ambulatory Visit: Payer: Self-pay

## 2020-06-29 ENCOUNTER — Telehealth: Payer: Self-pay | Admitting: Hospice and Palliative Medicine

## 2020-06-29 DIAGNOSIS — C61 Malignant neoplasm of prostate: Secondary | ICD-10-CM | POA: Diagnosis not present

## 2020-06-29 DIAGNOSIS — R5382 Chronic fatigue, unspecified: Secondary | ICD-10-CM | POA: Diagnosis not present

## 2020-06-29 DIAGNOSIS — M4802 Spinal stenosis, cervical region: Secondary | ICD-10-CM | POA: Diagnosis not present

## 2020-06-29 DIAGNOSIS — F410 Panic disorder [episodic paroxysmal anxiety] without agoraphobia: Secondary | ICD-10-CM | POA: Diagnosis not present

## 2020-06-29 DIAGNOSIS — F17220 Nicotine dependence, chewing tobacco, uncomplicated: Secondary | ICD-10-CM | POA: Diagnosis not present

## 2020-06-29 DIAGNOSIS — R220 Localized swelling, mass and lump, head: Secondary | ICD-10-CM | POA: Diagnosis not present

## 2020-06-29 DIAGNOSIS — Z741 Need for assistance with personal care: Secondary | ICD-10-CM | POA: Diagnosis not present

## 2020-06-29 DIAGNOSIS — Z9079 Acquired absence of other genital organ(s): Secondary | ICD-10-CM | POA: Diagnosis not present

## 2020-06-29 DIAGNOSIS — Z87891 Personal history of nicotine dependence: Secondary | ICD-10-CM | POA: Diagnosis not present

## 2020-06-29 MED ORDER — MORPHINE SULFATE ER 30 MG PO TBCR
30.0000 mg | EXTENDED_RELEASE_TABLET | Freq: Two times a day (BID) | ORAL | 0 refills | Status: DC
  Filled 2020-06-29: qty 30, 15d supply, fill #0

## 2020-06-29 NOTE — Telephone Encounter (Signed)
Patient seen last week in the ER after swallowing a fentanyl patch.  I spoke last week with hospice nurse who have a hospice team evaluating the home.  Had a follow-up conversation today with hospice nurse.  Also called and spoke with patient's wife.  Patient is adamant that he accidentally swallowed the fentanyl patch after putting it in his mouth when he was changing the patch but fell asleep during the process.  Patient has denied suicidal ideation.  Initially, I had some concern that behavior could be pseudo addictive due to under treatment of pain.  However, again, patient denies that event was intentional.  Patient does endorse progressively worse and poorly controlled generalized pain.  Worsening of pain could be in setting of disease progression.  It also could be secondary to reduced efficacy from the fentanyl patches as he has had progressive weight loss.  Discussed options with hospice team, wife, and Dr. Rogue Bussing.  Option for hydromorphone infusion was considered but patient does not have a port and I am concerned with chronic SQ infusion given his emaciated state.  Will discontinue transdermal fentanyl and start MS Contin 30 mg every 12 hours with plan to increase to every 8 hour dosing if needed.  Note, will start patient on a significantly lower dose of MS Contin given significant potential for incomplete cross tolerance and limited absorption of transdermal fentanyl.  Continue oxycodone as needed for breakthrough pain.  Continue daily bowel regimen.  Patient to see Dr. Rogue Bussing next week.  Patient's wife says that she will help with medication administration and to ensure the patient is taking medications as directed.

## 2020-06-30 DIAGNOSIS — Z741 Need for assistance with personal care: Secondary | ICD-10-CM | POA: Diagnosis not present

## 2020-06-30 DIAGNOSIS — Z9079 Acquired absence of other genital organ(s): Secondary | ICD-10-CM | POA: Diagnosis not present

## 2020-06-30 DIAGNOSIS — F17220 Nicotine dependence, chewing tobacco, uncomplicated: Secondary | ICD-10-CM | POA: Diagnosis not present

## 2020-06-30 DIAGNOSIS — Z87891 Personal history of nicotine dependence: Secondary | ICD-10-CM | POA: Diagnosis not present

## 2020-06-30 DIAGNOSIS — F410 Panic disorder [episodic paroxysmal anxiety] without agoraphobia: Secondary | ICD-10-CM | POA: Diagnosis not present

## 2020-06-30 DIAGNOSIS — M4802 Spinal stenosis, cervical region: Secondary | ICD-10-CM | POA: Diagnosis not present

## 2020-06-30 DIAGNOSIS — C61 Malignant neoplasm of prostate: Secondary | ICD-10-CM | POA: Diagnosis not present

## 2020-06-30 DIAGNOSIS — R5382 Chronic fatigue, unspecified: Secondary | ICD-10-CM | POA: Diagnosis not present

## 2020-06-30 DIAGNOSIS — R220 Localized swelling, mass and lump, head: Secondary | ICD-10-CM | POA: Diagnosis not present

## 2020-07-01 ENCOUNTER — Other Ambulatory Visit: Payer: Self-pay

## 2020-07-01 ENCOUNTER — Telehealth: Payer: Self-pay | Admitting: Hospice and Palliative Medicine

## 2020-07-01 DIAGNOSIS — R5382 Chronic fatigue, unspecified: Secondary | ICD-10-CM | POA: Diagnosis not present

## 2020-07-01 DIAGNOSIS — Z9079 Acquired absence of other genital organ(s): Secondary | ICD-10-CM | POA: Diagnosis not present

## 2020-07-01 DIAGNOSIS — R220 Localized swelling, mass and lump, head: Secondary | ICD-10-CM | POA: Diagnosis not present

## 2020-07-01 DIAGNOSIS — Z741 Need for assistance with personal care: Secondary | ICD-10-CM | POA: Diagnosis not present

## 2020-07-01 DIAGNOSIS — M4802 Spinal stenosis, cervical region: Secondary | ICD-10-CM | POA: Diagnosis not present

## 2020-07-01 DIAGNOSIS — F17220 Nicotine dependence, chewing tobacco, uncomplicated: Secondary | ICD-10-CM | POA: Diagnosis not present

## 2020-07-01 DIAGNOSIS — F410 Panic disorder [episodic paroxysmal anxiety] without agoraphobia: Secondary | ICD-10-CM | POA: Diagnosis not present

## 2020-07-01 DIAGNOSIS — Z87891 Personal history of nicotine dependence: Secondary | ICD-10-CM | POA: Diagnosis not present

## 2020-07-01 DIAGNOSIS — C61 Malignant neoplasm of prostate: Secondary | ICD-10-CM | POA: Diagnosis not present

## 2020-07-01 MED ORDER — LORAZEPAM 0.5 MG PO TABS
0.5000 mg | ORAL_TABLET | ORAL | 0 refills | Status: DC | PRN
  Filled 2020-07-01: qty 60, 10d supply, fill #0

## 2020-07-01 MED ORDER — MORPHINE SULFATE ER 30 MG PO TBCR: 30.0000 mg | EXTENDED_RELEASE_TABLET | Freq: Three times a day (TID) | ORAL | 0 refills | Status: DC

## 2020-07-01 MED ORDER — OXYCODONE HCL 15 MG PO TABS
15.0000 mg | ORAL_TABLET | ORAL | 0 refills | Status: DC | PRN
  Filled 2020-07-01: qty 90, 8d supply, fill #0

## 2020-07-01 MED ORDER — OXYCODONE HCL 15 MG PO TABS: 15.0000 mg | ORAL_TABLET | ORAL | 0 refills | Status: DC | PRN

## 2020-07-01 MED ORDER — MORPHINE SULFATE ER 30 MG PO TBCR
30.0000 mg | EXTENDED_RELEASE_TABLET | Freq: Three times a day (TID) | ORAL | 0 refills | Status: DC
  Filled 2020-07-01: qty 45, 15d supply, fill #0

## 2020-07-01 NOTE — Addendum Note (Signed)
Addended by: Altha Harm R on: 07/01/2020 12:23 PM   Modules accepted: Orders

## 2020-07-01 NOTE — Addendum Note (Signed)
Addended by: Altha Harm R on: 07/01/2020 12:06 PM   Modules accepted: Orders

## 2020-07-01 NOTE — Telephone Encounter (Signed)
I received a call from hospice nurse.  Patient reports increased pain on MS Contin.  He is now taking MS Contin 30 mg every 8 hours.  Wife says that she feels the MS Contin is controlling the pain but wearing off after 5 or 6 hours.  Family has not been giving him oxycodone for breakthrough pain as they thought that it was stopped when we rotated from fentanyl to MS Contin.  I clarified that patient can continue taking oxycodone as needed for breakthrough pain.  Will increase oxycodone to 1 to 2 tablets (15 to 30 mg) every 4 hours as needed for breakthrough pain.  Could consider further dose increase of MS Contin to 60 mg every 8 hours if needed.  Refill oxycodone #90

## 2020-07-02 DIAGNOSIS — G620 Drug-induced polyneuropathy: Secondary | ICD-10-CM | POA: Diagnosis not present

## 2020-07-02 DIAGNOSIS — C61 Malignant neoplasm of prostate: Secondary | ICD-10-CM | POA: Diagnosis not present

## 2020-07-02 DIAGNOSIS — I25119 Atherosclerotic heart disease of native coronary artery with unspecified angina pectoris: Secondary | ICD-10-CM | POA: Diagnosis not present

## 2020-07-02 DIAGNOSIS — C7951 Secondary malignant neoplasm of bone: Secondary | ICD-10-CM | POA: Diagnosis not present

## 2020-07-02 DIAGNOSIS — I10 Essential (primary) hypertension: Secondary | ICD-10-CM | POA: Diagnosis not present

## 2020-07-02 DIAGNOSIS — R634 Abnormal weight loss: Secondary | ICD-10-CM | POA: Diagnosis not present

## 2020-07-02 DIAGNOSIS — R630 Anorexia: Secondary | ICD-10-CM | POA: Diagnosis not present

## 2020-07-02 DIAGNOSIS — E785 Hyperlipidemia, unspecified: Secondary | ICD-10-CM | POA: Diagnosis not present

## 2020-07-02 DIAGNOSIS — I252 Old myocardial infarction: Secondary | ICD-10-CM | POA: Diagnosis not present

## 2020-07-03 DIAGNOSIS — R630 Anorexia: Secondary | ICD-10-CM | POA: Diagnosis not present

## 2020-07-03 DIAGNOSIS — I252 Old myocardial infarction: Secondary | ICD-10-CM | POA: Diagnosis not present

## 2020-07-03 DIAGNOSIS — I10 Essential (primary) hypertension: Secondary | ICD-10-CM | POA: Diagnosis not present

## 2020-07-03 DIAGNOSIS — I25119 Atherosclerotic heart disease of native coronary artery with unspecified angina pectoris: Secondary | ICD-10-CM | POA: Diagnosis not present

## 2020-07-03 DIAGNOSIS — C61 Malignant neoplasm of prostate: Secondary | ICD-10-CM | POA: Diagnosis not present

## 2020-07-03 DIAGNOSIS — E785 Hyperlipidemia, unspecified: Secondary | ICD-10-CM | POA: Diagnosis not present

## 2020-07-03 DIAGNOSIS — G620 Drug-induced polyneuropathy: Secondary | ICD-10-CM | POA: Diagnosis not present

## 2020-07-03 DIAGNOSIS — R634 Abnormal weight loss: Secondary | ICD-10-CM | POA: Diagnosis not present

## 2020-07-03 DIAGNOSIS — C7951 Secondary malignant neoplasm of bone: Secondary | ICD-10-CM | POA: Diagnosis not present

## 2020-07-03 MED FILL — Gabapentin Cap 300 MG: ORAL | 30 days supply | Qty: 90 | Fill #1 | Status: AC

## 2020-07-04 DIAGNOSIS — E785 Hyperlipidemia, unspecified: Secondary | ICD-10-CM | POA: Diagnosis not present

## 2020-07-04 DIAGNOSIS — R630 Anorexia: Secondary | ICD-10-CM | POA: Diagnosis not present

## 2020-07-04 DIAGNOSIS — C7951 Secondary malignant neoplasm of bone: Secondary | ICD-10-CM | POA: Diagnosis not present

## 2020-07-04 DIAGNOSIS — C61 Malignant neoplasm of prostate: Secondary | ICD-10-CM | POA: Diagnosis not present

## 2020-07-04 DIAGNOSIS — G620 Drug-induced polyneuropathy: Secondary | ICD-10-CM | POA: Diagnosis not present

## 2020-07-04 DIAGNOSIS — R634 Abnormal weight loss: Secondary | ICD-10-CM | POA: Diagnosis not present

## 2020-07-04 DIAGNOSIS — I252 Old myocardial infarction: Secondary | ICD-10-CM | POA: Diagnosis not present

## 2020-07-04 DIAGNOSIS — I25119 Atherosclerotic heart disease of native coronary artery with unspecified angina pectoris: Secondary | ICD-10-CM | POA: Diagnosis not present

## 2020-07-04 DIAGNOSIS — I10 Essential (primary) hypertension: Secondary | ICD-10-CM | POA: Diagnosis not present

## 2020-07-05 DIAGNOSIS — C61 Malignant neoplasm of prostate: Secondary | ICD-10-CM | POA: Diagnosis not present

## 2020-07-05 DIAGNOSIS — R630 Anorexia: Secondary | ICD-10-CM | POA: Diagnosis not present

## 2020-07-05 DIAGNOSIS — G620 Drug-induced polyneuropathy: Secondary | ICD-10-CM | POA: Diagnosis not present

## 2020-07-05 DIAGNOSIS — I25119 Atherosclerotic heart disease of native coronary artery with unspecified angina pectoris: Secondary | ICD-10-CM | POA: Diagnosis not present

## 2020-07-05 DIAGNOSIS — R634 Abnormal weight loss: Secondary | ICD-10-CM | POA: Diagnosis not present

## 2020-07-05 DIAGNOSIS — E785 Hyperlipidemia, unspecified: Secondary | ICD-10-CM | POA: Diagnosis not present

## 2020-07-05 DIAGNOSIS — I252 Old myocardial infarction: Secondary | ICD-10-CM | POA: Diagnosis not present

## 2020-07-05 DIAGNOSIS — I10 Essential (primary) hypertension: Secondary | ICD-10-CM | POA: Diagnosis not present

## 2020-07-05 DIAGNOSIS — C7951 Secondary malignant neoplasm of bone: Secondary | ICD-10-CM | POA: Diagnosis not present

## 2020-07-06 ENCOUNTER — Other Ambulatory Visit: Payer: Self-pay

## 2020-07-06 ENCOUNTER — Telehealth: Payer: Self-pay | Admitting: Hospice and Palliative Medicine

## 2020-07-06 DIAGNOSIS — R634 Abnormal weight loss: Secondary | ICD-10-CM | POA: Diagnosis not present

## 2020-07-06 DIAGNOSIS — C61 Malignant neoplasm of prostate: Secondary | ICD-10-CM | POA: Diagnosis not present

## 2020-07-06 DIAGNOSIS — R630 Anorexia: Secondary | ICD-10-CM | POA: Diagnosis not present

## 2020-07-06 DIAGNOSIS — G620 Drug-induced polyneuropathy: Secondary | ICD-10-CM | POA: Diagnosis not present

## 2020-07-06 DIAGNOSIS — C7951 Secondary malignant neoplasm of bone: Secondary | ICD-10-CM | POA: Diagnosis not present

## 2020-07-06 DIAGNOSIS — I10 Essential (primary) hypertension: Secondary | ICD-10-CM | POA: Diagnosis not present

## 2020-07-06 DIAGNOSIS — I25119 Atherosclerotic heart disease of native coronary artery with unspecified angina pectoris: Secondary | ICD-10-CM | POA: Diagnosis not present

## 2020-07-06 DIAGNOSIS — I252 Old myocardial infarction: Secondary | ICD-10-CM | POA: Diagnosis not present

## 2020-07-06 DIAGNOSIS — E785 Hyperlipidemia, unspecified: Secondary | ICD-10-CM | POA: Diagnosis not present

## 2020-07-06 NOTE — Telephone Encounter (Signed)
Received a call from patient's hospice nurse, Anderson Malta.  Patient apparently has been declining with minimal oral intake and worsening peripheral edema.  Patient attributed loss of appetite to taking the MS Contin and so therefore discontinued it.  Family restarted transdermal fentanyl without informing hospice.  Hospice nurse plans to make a visit today.  Given his overall decline, we discussed option of Hospice Home for symptom management and possible end-of-life care.  Nurse plans to explore options with patient.

## 2020-07-07 DIAGNOSIS — I252 Old myocardial infarction: Secondary | ICD-10-CM | POA: Diagnosis not present

## 2020-07-07 DIAGNOSIS — I25119 Atherosclerotic heart disease of native coronary artery with unspecified angina pectoris: Secondary | ICD-10-CM | POA: Diagnosis not present

## 2020-07-07 DIAGNOSIS — E785 Hyperlipidemia, unspecified: Secondary | ICD-10-CM | POA: Diagnosis not present

## 2020-07-07 DIAGNOSIS — R630 Anorexia: Secondary | ICD-10-CM | POA: Diagnosis not present

## 2020-07-07 DIAGNOSIS — C61 Malignant neoplasm of prostate: Secondary | ICD-10-CM | POA: Diagnosis not present

## 2020-07-07 DIAGNOSIS — R634 Abnormal weight loss: Secondary | ICD-10-CM | POA: Diagnosis not present

## 2020-07-07 DIAGNOSIS — C7951 Secondary malignant neoplasm of bone: Secondary | ICD-10-CM | POA: Diagnosis not present

## 2020-07-07 DIAGNOSIS — I10 Essential (primary) hypertension: Secondary | ICD-10-CM | POA: Diagnosis not present

## 2020-07-07 DIAGNOSIS — G620 Drug-induced polyneuropathy: Secondary | ICD-10-CM | POA: Diagnosis not present

## 2020-07-08 ENCOUNTER — Other Ambulatory Visit: Payer: Self-pay | Admitting: *Deleted

## 2020-07-08 DIAGNOSIS — C7951 Secondary malignant neoplasm of bone: Secondary | ICD-10-CM | POA: Diagnosis not present

## 2020-07-08 DIAGNOSIS — C61 Malignant neoplasm of prostate: Secondary | ICD-10-CM | POA: Diagnosis not present

## 2020-07-08 DIAGNOSIS — R634 Abnormal weight loss: Secondary | ICD-10-CM | POA: Diagnosis not present

## 2020-07-08 DIAGNOSIS — I25119 Atherosclerotic heart disease of native coronary artery with unspecified angina pectoris: Secondary | ICD-10-CM | POA: Diagnosis not present

## 2020-07-08 DIAGNOSIS — I252 Old myocardial infarction: Secondary | ICD-10-CM | POA: Diagnosis not present

## 2020-07-08 DIAGNOSIS — G620 Drug-induced polyneuropathy: Secondary | ICD-10-CM | POA: Diagnosis not present

## 2020-07-08 DIAGNOSIS — E785 Hyperlipidemia, unspecified: Secondary | ICD-10-CM | POA: Diagnosis not present

## 2020-07-08 DIAGNOSIS — R630 Anorexia: Secondary | ICD-10-CM | POA: Diagnosis not present

## 2020-07-08 DIAGNOSIS — I10 Essential (primary) hypertension: Secondary | ICD-10-CM | POA: Diagnosis not present

## 2020-07-09 ENCOUNTER — Other Ambulatory Visit: Payer: Self-pay

## 2020-07-09 ENCOUNTER — Inpatient Hospital Stay (HOSPITAL_BASED_OUTPATIENT_CLINIC_OR_DEPARTMENT_OTHER): Payer: 59 | Admitting: Internal Medicine

## 2020-07-09 ENCOUNTER — Inpatient Hospital Stay: Payer: 59 | Attending: Internal Medicine

## 2020-07-09 ENCOUNTER — Encounter: Payer: Self-pay | Admitting: Internal Medicine

## 2020-07-09 DIAGNOSIS — C7951 Secondary malignant neoplasm of bone: Secondary | ICD-10-CM | POA: Insufficient documentation

## 2020-07-09 DIAGNOSIS — R634 Abnormal weight loss: Secondary | ICD-10-CM | POA: Diagnosis not present

## 2020-07-09 DIAGNOSIS — Z9079 Acquired absence of other genital organ(s): Secondary | ICD-10-CM | POA: Diagnosis not present

## 2020-07-09 DIAGNOSIS — C61 Malignant neoplasm of prostate: Secondary | ICD-10-CM | POA: Insufficient documentation

## 2020-07-09 DIAGNOSIS — Z79899 Other long term (current) drug therapy: Secondary | ICD-10-CM | POA: Diagnosis not present

## 2020-07-09 DIAGNOSIS — R630 Anorexia: Secondary | ICD-10-CM | POA: Diagnosis not present

## 2020-07-09 DIAGNOSIS — G620 Drug-induced polyneuropathy: Secondary | ICD-10-CM | POA: Diagnosis not present

## 2020-07-09 DIAGNOSIS — I25119 Atherosclerotic heart disease of native coronary artery with unspecified angina pectoris: Secondary | ICD-10-CM | POA: Diagnosis not present

## 2020-07-09 DIAGNOSIS — I10 Essential (primary) hypertension: Secondary | ICD-10-CM | POA: Diagnosis not present

## 2020-07-09 DIAGNOSIS — E785 Hyperlipidemia, unspecified: Secondary | ICD-10-CM | POA: Diagnosis not present

## 2020-07-09 DIAGNOSIS — I252 Old myocardial infarction: Secondary | ICD-10-CM | POA: Diagnosis not present

## 2020-07-09 DIAGNOSIS — B370 Candidal stomatitis: Secondary | ICD-10-CM | POA: Insufficient documentation

## 2020-07-09 LAB — COMPREHENSIVE METABOLIC PANEL
ALT: 10 U/L (ref 0–44)
AST: 17 U/L (ref 15–41)
Albumin: 3 g/dL — ABNORMAL LOW (ref 3.5–5.0)
Alkaline Phosphatase: 62 U/L (ref 38–126)
Anion gap: 11 (ref 5–15)
BUN: 10 mg/dL (ref 8–23)
CO2: 25 mmol/L (ref 22–32)
Calcium: 8.5 mg/dL — ABNORMAL LOW (ref 8.9–10.3)
Chloride: 100 mmol/L (ref 98–111)
Creatinine, Ser: 0.71 mg/dL (ref 0.61–1.24)
GFR, Estimated: >60 mL/min (ref >60)
Glucose, Bld: 211 mg/dL — ABNORMAL HIGH (ref 70–99)
Potassium: 3.8 mmol/L (ref 3.5–5.1)
Sodium: 136 mmol/L (ref 135–145)
Total Bilirubin: 0.5 mg/dL (ref 0.3–1.2)
Total Protein: 5.7 g/dL — ABNORMAL LOW (ref 6.5–8.1)

## 2020-07-09 LAB — CBC WITH DIFFERENTIAL/PLATELET
Abs Immature Granulocytes: 0.12 10*3/uL — ABNORMAL HIGH (ref 0.00–0.07)
Basophils Absolute: 0 10*3/uL (ref 0.0–0.1)
Basophils Relative: 0 %
Eosinophils Absolute: 0 10*3/uL (ref 0.0–0.5)
Eosinophils Relative: 0 %
HCT: 35.4 % — ABNORMAL LOW (ref 39.0–52.0)
Hemoglobin: 11.3 g/dL — ABNORMAL LOW (ref 13.0–17.0)
Immature Granulocytes: 2 %
Lymphocytes Relative: 14 %
Lymphs Abs: 1.1 10*3/uL (ref 0.7–4.0)
MCH: 30.4 pg (ref 26.0–34.0)
MCHC: 31.9 g/dL (ref 30.0–36.0)
MCV: 95.2 fL (ref 80.0–100.0)
Monocytes Absolute: 0.2 10*3/uL (ref 0.1–1.0)
Monocytes Relative: 3 %
Neutro Abs: 6.4 10*3/uL (ref 1.7–7.7)
Neutrophils Relative %: 81 %
Platelets: 304 10*3/uL (ref 150–400)
RBC: 3.72 MIL/uL — ABNORMAL LOW (ref 4.22–5.81)
RDW: 14.7 % (ref 11.5–15.5)
WBC: 7.9 10*3/uL (ref 4.0–10.5)
nRBC: 0 % (ref 0.0–0.2)

## 2020-07-09 LAB — PSA: Prostatic Specific Antigen: 605 ng/mL — ABNORMAL HIGH (ref 0.00–4.00)

## 2020-07-09 MED ORDER — STERILE WATER FOR INJECTION IJ SOLN
OROMUCOSAL | 3 refills | Status: AC
  Filled 2020-07-09: qty 480, 15d supply, fill #0
  Filled 2020-08-18 – 2020-08-19 (×2): qty 480, 15d supply, fill #1

## 2020-07-09 MED ORDER — OXYCODONE HCL 15 MG PO TABS
15.0000 mg | ORAL_TABLET | ORAL | 0 refills | Status: DC | PRN
  Filled 2020-07-09: qty 90, 8d supply, fill #0

## 2020-07-09 MED ORDER — FENTANYL 50 MCG/HR TD PT72
MEDICATED_PATCH | TRANSDERMAL | 0 refills | Status: DC
  Filled 2020-07-09: qty 10, 20d supply, fill #0

## 2020-07-09 MED ORDER — ONDANSETRON HCL 8 MG PO TABS
8.0000 mg | ORAL_TABLET | Freq: Three times a day (TID) | ORAL | 1 refills | Status: AC | PRN
  Filled 2020-07-09: qty 20, 7d supply, fill #0

## 2020-07-09 MED ORDER — FENTANYL 100 MCG/HR TD PT72
MEDICATED_PATCH | TRANSDERMAL | 0 refills | Status: DC
  Filled 2020-07-09: qty 10, 20d supply, fill #0

## 2020-07-09 NOTE — Progress Notes (Signed)
Wife stopped morphine due to side effects. States he acted like a "zombie". So she put him back on patches which the hospice nurse spoke to [place] about. Having swelling in abdomen and legs. Has had a 21 pound weigh gain since last visit with Dr. Jacinto Reap in march. Wants refill of patches.

## 2020-07-09 NOTE — Assessment & Plan Note (Addendum)
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 ADT;  MAY 2021- PET- Improved; however PSA- rising.  Most recently on cabazitaxel s/p #2 [80% dose]- poorly tolerated [see below];   #Progressive decline is noted likely from malignancy.  Continue hospice.  #Weight gain/abdominal distention secondary to abdominal adiposity-likely secondary to steroid use.  Does not appear to be fluid retention/no clinical evidence of ascites.  Discussed regarding ultrasound-patient wants to hold off for now.  #Oral thrush-secondary steroids- nystatin-improved.  #Fatigue-progressive secondary to underlying malignancy.  Continue oral dexamethasone 2 mg p.o. twice a day [taper];   # Malignancy related pain [poor tolerance to MS contin]- Re-STARTED fentanyl patch hydrocodone;. Continue fentanyl patch to every 48 hours and continue oxycodone prn-stable.  *Patient preference/follow-up # DISPOSITION: # 3 month - MD; labs- cbc/cmp;PSA-Dr.B

## 2020-07-09 NOTE — Progress Notes (Signed)
[place] OFFICE PROGRESS NOTE  Patient Care Team: Olin Hauser, [name], MD as PCP - Cardiology (Cardiology) Minna Merritts, [name], [name]  Cancer Staging No matching staging information was found for the patient.   Oncology History Overview Note  # 2011- PROSTATE CANCER [Gleason 3+4]; s/p Prostatectomy [ also involved bladder neck/ECP; Dr.Polaseck]; July 2014- Biochemical recurrence [PSA 14]- started on Zoladex [Dr.Pandit]; lost to follow up.  # JAN 2017- STAGE IV METASTATIC PROSTATE Cancer to Bone- Feb 13th, 2017-  Lupron q 60m[~end of feb]; PSA: 1021; Declined Chemo; April 2017 [xofigo x6; Dr.Crystal]; AUG 2017- Zytiga + Prednisone BID. Bone scan-Jan 2018- improved skeletal metastases.   # MAY 1st 2019- START X-tandi [stopped zytiga/PSA- 8.8/rising]  #August 23, 2018-stop Xtandi [intolerance-fatigue mental fogginess]  #Mid September 2020-apalutamide; stopped mid-October poor tolerance/progression  # 11/01/2018- -Taxotere weekly [consent]; July 2021-progression of disease/PSA   # 08/13/2019-cabazitaxel; Neulasta  # Mets to bone- start X-geva q 31M [May 30th]; hold dental issues/infections  # Smoker/ chronic pain/pain clinic   #[April 2018; [place]]- s/p stenting; on Plavix; --------------------------------------------------------------  DIAGNOSIS: '[ ]'  Castrate resistant prostate cancer  STAGE:   4    ;GOALS: Palliative  CURRENT/MOST RECENT THERAPY-cabazitaxel    [place]  12/08/2014 Initial Diagnosis   [place]    11/01/2018 - 07/09/2019 Chemotherapy   The patient had DOCEtaxel (TAXOTERE) 70 mg in sodium chloride 0.9 % 150 mL chemo infusion, 36 mg/m2 = 70 mg, Intravenous,  Once, 19 of 21 cycles Administration: 70 mg (11/08/2018),  70 mg (11/22/2018), 70 mg (12/13/2018), 70 mg (12/06/2018), 70 mg (12/31/2018), 70 mg (01/07/2019), 70 mg (01/29/2019), 70 mg (12/20/2018), 70 mg (02/06/2019), 70 mg (02/14/2019), 70 mg (02/28/2019), 70 mg (03/14/2019), 70 mg (03/28/2019), 70 mg (04/15/2019), 70 mg (04/29/2019), 70 mg (05/27/2019), 70 mg (06/24/2019), 70 mg (07/09/2019)   for chemotherapy treatment.     08/13/2019 -  Chemotherapy   The patient had pegfilgrastim (NEULASTA) injection 6 mg, 6 mg, Subcutaneous, Once, 3 of 6 cycles Administration: 6 mg (08/14/2019), 6 mg (09/19/2019) cabazitaxel (JEVTANA) 38 mg in sodium chloride 0.9 % 250 mL chemo infusion, 20 mg/m2 = 38 mg, Intravenous,  Once, 3 of 6 cycles Dose modification: 16 mg/m2 (original dose 20 mg/m2, Cycle 3, Reason: Provider Judgment) Administration: 38 mg (08/13/2019), 30 mg (09/18/2019)   for chemotherapy treatment.      INTERVAL HISTORY:  Robert [id number].[id number].  male pleasant patient above history of metastatic castrate resistant prostate cancer currently enrolled in hospice is here for follow-up.  In the interim patient was evaluated in the emergency room for "swallowing fentanyl patch".  Patch was extracted by GI.  Patient had poor tolerance to MS Contin when he was taken off fentanyl patch.  After discussion with palliative care and hospice patient was restarted back on fentanyl patch.  Continues to be on oxycodone for breakthrough pain.  Complains of abdominal fullness.  Complains of overall generalized weakness especially in his legs.  He continues to be on dexamethasone.  Review of Systems  Constitutional:  Positive for malaise/fatigue and weight loss. Negative for chills, diaphoresis and fever.  HENT:  Negative for nosebleeds and sore throat.   Eyes:  Negative for double vision.  Respiratory:  Positive for shortness of breath. Negative for cough, hemoptysis, sputum production and wheezing.  Cardiovascular:  Negative for chest pain, palpitations and orthopnea.   Gastrointestinal:  Positive for nausea. Negative for abdominal pain, blood in stool, diarrhea, heartburn, melena and vomiting.  Genitourinary:  Negative for dysuria, frequency and urgency.  Musculoskeletal:  Positive for back pain and joint pain.  Skin:  Negative for itching.  Neurological:  Negative for dizziness, tingling, focal weakness, weakness and headaches.  Endo/Heme/Allergies:  Does not bruise/bleed easily.  Psychiatric/Behavioral:  Negative for depression. The patient is not nervous/anxious and does not have insomnia.      PAST MEDICAL HISTORY :  Past Medical History:  Diagnosis Date  . Back pain 10/09/2012  . Bone cancer ([place])   . CAD (coronary artery disease)    a. 08/2015 PCI: LM nl, LAD 20d, LCX nl, RCA 81m RPAV 95 (3.0x18 Xience Alpine DES);  b. 04/2016 PCI ([facility]: RPL 95 (2.75x8 Promus Premier DES).  . Cancer associated pain   . Depression   . History of echocardiogram    a. 08/2016 Echo: EF 50-55%.  .Marland KitchenHistory of kidney stones   . Hyperlipidemia   . Hypertension   . Joint pain   . Prostate cancer ([facility]    a.  s/p prostatectomy ([facility]);  b. bone mets noted 04/2016.  . Right arm pain 01/10/2016  . Right foot pain 01/10/2016  . Right leg pain 01/10/2016  . Tobacco abuse     PAST SURGICAL HISTORY :   Past Surgical History:  Procedure Laterality Date  . CARDIAC CATHETERIZATION     [facility]  . CARDIAC CATHETERIZATION N/A 08/16/2015   Procedure: Left Heart Cath and Coronary Angiography;  Surgeon: DYolonda Kida MD;  [facility];  Service: Cardiovascular;  Laterality: N/A;  . CARDIAC CATHETERIZATION N/A 08/16/2015   Procedure: Coronary Stent Intervention;  Surgeon: DYolonda Kida MD;  [facility];  Service: Cardiovascular;  Laterality: N/A;  . CERVIX SURGERY    . ESOPHAGOGASTRODUODENOSCOPY N/A 06/25/2020   Procedure: ESOPHAGOGASTRODUODENOSCOPY (EGD);  Surgeon: LLesly Rubenstein MD;  [facility];  Service:  Endoscopy;  Laterality: N/A;  . PROSTATECTOMY    . SPINE SURGERY    . TONSILLECTOMY    . WRIST SURGERY      FAMILY HISTORY :   Family History  Problem Relation Age of Onset  . Heart attack Mother   . Hypertension Mother   . Heart attack Father     SOCIAL HISTORY:   Social History   Tobacco Use  . Smoking status: Former    Packs/day: 0.50    Years: 40.00    Pack years: 20.00    Types: Cigarettes  . Smokeless tobacco: Current    Types: Chew  . Tobacco comments:    1 pack every couple days   Vaping Use  . Vaping Use: Never used  Substance Use Topics  . Alcohol use: Yes    Comment: occasionally  . Drug use: No    ALLERGIES:  is allergic to ditropan [oxybutynin].  MEDICATIONS:  Current Outpatient Medications  Medication Sig Dispense Refill  . acetaminophen (TYLENOL) 500 MG tablet Take 1,000 mg by mouth every 8 (eight) hours as needed.     .Marland Kitchenalbuterol (PROVENTIL HFA;VENTOLIN HFA) 108 (90 Base) MCG/ACT inhaler Inhale 1-2 puffs into the lungs every 6 (six) hours as needed for wheezing or shortness of breath. 1 Inhaler 0  . aspirin 81 MG tablet Take 81 mg by mouth daily.    .Marland Kitchendexamethasone (DECADRON) 4 MG tablet TAKE 1  TABLET BY MOUTH TWICE DAILY WITH A MEAL. 30 tablet 1  . fentaNYL (DURAGESIC) 100 MCG/HR Put on one patch [100 mcg] every 48 hours [2 days;  along with 150 mcg patch]. 10 patch 0  . fentaNYL (DURAGESIC) 50 MCG/HR Put on one patch [50 mcg] every 48 hours [2 days;  along with 100 mcg patch]. 10 patch 0  . gabapentin (NEURONTIN) 300 MG capsule TAKE 1 CAPSULE BY MOUTH 3 TIMES DAILY 90 capsule 2  . ipratropium-albuterol (DUONEB) 0.5-2.5 (3) MG/3ML SOLN Take 3 mLs by nebulization every 6 (six) hours as needed for shortness of breath or wheezing. 180 mL 1  . LORazepam (ATIVAN) 0.5 MG tablet Take 1 tablet (0.5 mg total) by mouth every 4 (four) hours as needed for anxiety. 60 tablet 0  . magic mouthwash SOLN Take 5 mLs by mouth 4 (four) times daily. 240 mL 0  .  metoprolol tartrate (LOPRESSOR) 25 MG tablet TAKE 1 TABLET (25 MG TOTAL) BY MOUTH 2 (TWO) TIMES DAILY. 60 tablet 1  . naloxone (NARCAN) nasal spray 4 mg/0.1 mL 1 spray into nostril upon signs of opioid overdose. Call 911. May repeat once if no response within 2-3 minutes. 1 kit 0  . nystatin (MYCOSTATIN) 100000 UNIT/ML suspension TAKE 5 ML BY MOUTH 3 (THREE) TIMES DAILY. 300 mL 0  . polyethylene glycol (MIRALAX / GLYCOLAX) packet Take 17 g by mouth daily as needed for moderate constipation.     . promethazine (PHENERGAN) 25 MG tablet TAKE 1/2 TO 1 TABLET (12.5-25 MG TOTAL) BY MOUTH EVERY 8 (EIGHT) HOURS AS NEEDED FOR NAUSEA OR VOMITING. 60 tablet 3  . atorvastatin (LIPITOR) 80 MG tablet Take 1 tablet (80 mg total) by mouth daily at 6 PM. (Patient not taking: No sig reported) 90 tablet 3  . clopidogrel (PLAVIX) 75 MG tablet Take 1 tablet (75 mg total) by mouth daily. (Patient not taking: No sig reported) 90 tablet 3  . magic mouthwash (multi-ingredient) oral suspension Swish and swallow 5 to 10 mL by mouth four times daily as needed 480 mL 3  . Morphine Sulfate (MORPHINE CONCENTRATE) 10 mg / 0.5 ml concentrated solution TAKE 0.25-0.5 MLS (5-10 MG TOTAL) BY MOUTH EVERY 2 HOURS AS NEEDED FOR SEVERE PAIN. (Patient not taking: Reported on 07/09/2020) 30 mL 0  . nitroGLYCERIN (NITROSTAT) 0.4 MG SL tablet Place 1 tablet (0.4 mg total) under the tongue every 5 (five) minutes as needed. (Patient not taking: No sig reported) 25 tablet 3  . ondansetron (ZOFRAN) 8 MG tablet Take 1 tablet (8 mg total) by mouth every 8 (eight) hours as needed for nausea or vomiting. 20 tablet 1  . oxyCODONE (ROXICODONE) 15 MG immediate release tablet Take 1-2 tablets (15-30 mg total) by mouth every 4 (four) hours as needed for pain. 90 tablet 0  . traZODone (DESYREL) 50 MG tablet TAKE 1 TO 2 TABLETS BY MOUTH AT BEDTIME AS NEEDED FOR SLEEP. 60 tablet 2   No current facility-administered medications for this visit.    PHYSICAL  EXAMINATION: ECOG PERFORMANCE STATUS: 1 - Symptomatic but completely ambulatory  BP (!) 142/100 (BP Location: Left Arm, Patient Position: Sitting, Cuff Size: Normal)   Pulse 83   Temp (!) 97.2 F (36.2 C) (Tympanic)   Resp 16   Ht '5\' 11"'  (1.803 m)   Wt 176 lb (79.8 kg)   BMI 24.55 kg/m   Filed Weights   07/09/20 1526  Weight: 176 lb (79.8 kg)    Physical Exam Constitutional:  Comments:  Mechele Claude Caucasian male patient.  He is in a wheelchair.Marland Kitchen  He is accompanied by his wife.  HENT:     Head: Normocephalic and atraumatic.     Mouth/Throat:     Pharynx: No oropharyngeal exudate.     Comments: Positive for thrush in oral mucosa. Eyes:     Pupils: Pupils are equal, round, and reactive to light.  Cardiovascular:     Rate and Rhythm: Normal rate and regular rhythm.  Pulmonary:     Effort: No respiratory distress.     Breath sounds: No wheezing.  Abdominal:     General: Bowel sounds are normal. There is no distension.     Palpations: Abdomen is soft. There is no mass.     Tenderness: no abdominal tenderness There is no guarding or rebound.  Musculoskeletal:        General: No tenderness. Normal range of motion.     Cervical back: Normal range of motion and neck supple.     Comments: .  Skin:    General: Skin is warm.     Comments: Macular rash on hands  Neurological:     Mental Status: He is alert and oriented to person, place, and time.  Psychiatric:        Mood and Affect: Affect normal.    LABORATORY DATA:  I have reviewed the data as listed    Component Value Date/Time   NA 136 07/09/2020 1450   NA 140 07/13/2013 1542   K 3.8 07/09/2020 1450   K 4.3 07/13/2013 1542   CL 100 07/09/2020 1450   CL 107 07/13/2013 1542   CO2 25 07/09/2020 1450   CO2 26 07/13/2013 1542   GLUCOSE 211 (H) 07/09/2020 1450   GLUCOSE 106 (H) 07/13/2013 1542   BUN 10 07/09/2020 1450   BUN 11 07/13/2013 1542   CREATININE 0.71 07/09/2020 1450   CREATININE 0.76 10/15/2017  1001   CALCIUM 8.5 (L) 07/09/2020 1450   CALCIUM 9.9 07/13/2013 1542   PROT 5.7 (L) 07/09/2020 1450   PROT 8.1 07/13/2013 1542   ALBUMIN 3.0 (L) 07/09/2020 1450   ALBUMIN 4.3 07/13/2013 1542   AST 17 07/09/2020 1450   AST 26 07/13/2013 1542   ALT 10 07/09/2020 1450   ALT 32 07/13/2013 1542   ALKPHOS 62 07/09/2020 1450   ALKPHOS 74 07/13/2013 1542   BILITOT 0.5 07/09/2020 1450   BILITOT 0.4 07/13/2013 1542   GFRNONAA >60 07/09/2020 1450   GFRNONAA 95 10/15/2017 1001   GFRAA >60 10/03/2019 1349   GFRAA 110 10/15/2017 1001    No results found for: SPEP, UPEP  Lab Results  Component Value Date   WBC 7.9 07/09/2020   NEUTROABS 6.4 07/09/2020   HGB 11.3 (L) 07/09/2020   HCT 35.4 (L) 07/09/2020   MCV 95.2 07/09/2020   PLT 304 07/09/2020      Chemistry      Component Value Date/Time   NA 136 07/09/2020 1450   NA 140 07/13/2013 1542   K 3.8 07/09/2020 1450   K 4.3 07/13/2013 1542   CL 100 07/09/2020 1450   CL 107 07/13/2013 1542   CO2 25 07/09/2020 1450   CO2 26 07/13/2013 1542   BUN 10 07/09/2020 1450   BUN 11 07/13/2013 1542   CREATININE 0.71 07/09/2020 1450   CREATININE 0.76 10/15/2017 1001      Component Value Date/Time   CALCIUM 8.5 (L) 07/09/2020 1450   CALCIUM 9.9 07/13/2013 1542   ALKPHOS  62 07/09/2020 1450   ALKPHOS 74 07/13/2013 1542   AST 17 07/09/2020 1450   AST 26 07/13/2013 1542   ALT 10 07/09/2020 1450   ALT 32 07/13/2013 1542   BILITOT 0.5 07/09/2020 1450   BILITOT 0.4 07/13/2013 1542     RADIOGRAPHIC STUDIES: I have personally reviewed the radiological images as listed and agreed with the findings in the report. No results found.   ASSESSMENT & PLAN:  Prostate cancer metastatic to bone ([place]) # Castrate resistant prostate cancer metastases to bone. ADT;  MAY 2021- PET- Improved; however PSA- rising.  Most recently on cabazitaxel s/p #2 [80% dose]- poorly tolerated [see below];   #Progressive decline is noted likely from malignancy.   Continue hospice.  #Weight gain/abdominal distention secondary to abdominal adiposity-likely secondary to steroid use.  Does not appear to be fluid retention/no clinical evidence of ascites.  Discussed regarding ultrasound-patient wants to hold off for now.  #Oral thrush-secondary steroids- nystatin-improved.  #Fatigue-progressive secondary to underlying malignancy.  Continue oral dexamethasone 2 mg p.o. twice a day [taper];   # Malignancy related pain [poor tolerance to MS contin]- Re-STARTED fentanyl patch hydrocodone;. Continue fentanyl patch to every 48 hours and continue oxycodone prn-stable.  *Patient preference/follow-up # DISPOSITION: # 3 month - MD; labs- cbc/cmp;PSA-Dr.B     Orders Placed This Encounter  Procedures  . CBC with Differential/Platelet    Standing Status:   Future    Standing Expiration Date:   07/09/2021  . Comprehensive metabolic panel    Standing Status:   Future    Standing Expiration Date:   07/09/2021  . PSA    Standing Status:   Future    Standing Expiration Date:   07/09/2021   All questions were answered. The patient knows to call the clinic with any problems, questions or concerns.      [name], MD 07/11/2020 8:50 PM

## 2020-07-10 DIAGNOSIS — R634 Abnormal weight loss: Secondary | ICD-10-CM | POA: Diagnosis not present

## 2020-07-10 DIAGNOSIS — R630 Anorexia: Secondary | ICD-10-CM | POA: Diagnosis not present

## 2020-07-10 DIAGNOSIS — E785 Hyperlipidemia, unspecified: Secondary | ICD-10-CM | POA: Diagnosis not present

## 2020-07-10 DIAGNOSIS — C7951 Secondary malignant neoplasm of bone: Secondary | ICD-10-CM | POA: Diagnosis not present

## 2020-07-10 DIAGNOSIS — G620 Drug-induced polyneuropathy: Secondary | ICD-10-CM | POA: Diagnosis not present

## 2020-07-10 DIAGNOSIS — I252 Old myocardial infarction: Secondary | ICD-10-CM | POA: Diagnosis not present

## 2020-07-10 DIAGNOSIS — C61 Malignant neoplasm of prostate: Secondary | ICD-10-CM | POA: Diagnosis not present

## 2020-07-10 DIAGNOSIS — I10 Essential (primary) hypertension: Secondary | ICD-10-CM | POA: Diagnosis not present

## 2020-07-10 DIAGNOSIS — I25119 Atherosclerotic heart disease of native coronary artery with unspecified angina pectoris: Secondary | ICD-10-CM | POA: Diagnosis not present

## 2020-07-11 ENCOUNTER — Encounter: Payer: Self-pay | Admitting: Internal Medicine

## 2020-07-11 DIAGNOSIS — C61 Malignant neoplasm of prostate: Secondary | ICD-10-CM | POA: Diagnosis not present

## 2020-07-11 DIAGNOSIS — E785 Hyperlipidemia, unspecified: Secondary | ICD-10-CM | POA: Diagnosis not present

## 2020-07-11 DIAGNOSIS — G620 Drug-induced polyneuropathy: Secondary | ICD-10-CM | POA: Diagnosis not present

## 2020-07-11 DIAGNOSIS — I25119 Atherosclerotic heart disease of native coronary artery with unspecified angina pectoris: Secondary | ICD-10-CM | POA: Diagnosis not present

## 2020-07-11 DIAGNOSIS — R634 Abnormal weight loss: Secondary | ICD-10-CM | POA: Diagnosis not present

## 2020-07-11 DIAGNOSIS — I252 Old myocardial infarction: Secondary | ICD-10-CM | POA: Diagnosis not present

## 2020-07-11 DIAGNOSIS — C7951 Secondary malignant neoplasm of bone: Secondary | ICD-10-CM | POA: Diagnosis not present

## 2020-07-11 DIAGNOSIS — I10 Essential (primary) hypertension: Secondary | ICD-10-CM | POA: Diagnosis not present

## 2020-07-11 DIAGNOSIS — R630 Anorexia: Secondary | ICD-10-CM | POA: Diagnosis not present

## 2020-07-12 ENCOUNTER — Other Ambulatory Visit: Payer: Self-pay

## 2020-07-12 DIAGNOSIS — R630 Anorexia: Secondary | ICD-10-CM | POA: Diagnosis not present

## 2020-07-12 DIAGNOSIS — I25119 Atherosclerotic heart disease of native coronary artery with unspecified angina pectoris: Secondary | ICD-10-CM | POA: Diagnosis not present

## 2020-07-12 DIAGNOSIS — C7951 Secondary malignant neoplasm of bone: Secondary | ICD-10-CM | POA: Diagnosis not present

## 2020-07-12 DIAGNOSIS — I252 Old myocardial infarction: Secondary | ICD-10-CM | POA: Diagnosis not present

## 2020-07-12 DIAGNOSIS — G620 Drug-induced polyneuropathy: Secondary | ICD-10-CM | POA: Diagnosis not present

## 2020-07-12 DIAGNOSIS — E785 Hyperlipidemia, unspecified: Secondary | ICD-10-CM | POA: Diagnosis not present

## 2020-07-12 DIAGNOSIS — I10 Essential (primary) hypertension: Secondary | ICD-10-CM | POA: Diagnosis not present

## 2020-07-12 DIAGNOSIS — R634 Abnormal weight loss: Secondary | ICD-10-CM | POA: Diagnosis not present

## 2020-07-12 DIAGNOSIS — C61 Malignant neoplasm of prostate: Secondary | ICD-10-CM | POA: Diagnosis not present

## 2020-07-13 ENCOUNTER — Other Ambulatory Visit: Payer: Self-pay

## 2020-07-13 DIAGNOSIS — G620 Drug-induced polyneuropathy: Secondary | ICD-10-CM | POA: Diagnosis not present

## 2020-07-13 DIAGNOSIS — I252 Old myocardial infarction: Secondary | ICD-10-CM | POA: Diagnosis not present

## 2020-07-13 DIAGNOSIS — R630 Anorexia: Secondary | ICD-10-CM | POA: Diagnosis not present

## 2020-07-13 DIAGNOSIS — C7951 Secondary malignant neoplasm of bone: Secondary | ICD-10-CM | POA: Diagnosis not present

## 2020-07-13 DIAGNOSIS — I10 Essential (primary) hypertension: Secondary | ICD-10-CM | POA: Diagnosis not present

## 2020-07-13 DIAGNOSIS — I25119 Atherosclerotic heart disease of native coronary artery with unspecified angina pectoris: Secondary | ICD-10-CM | POA: Diagnosis not present

## 2020-07-13 DIAGNOSIS — R634 Abnormal weight loss: Secondary | ICD-10-CM | POA: Diagnosis not present

## 2020-07-13 DIAGNOSIS — C61 Malignant neoplasm of prostate: Secondary | ICD-10-CM | POA: Diagnosis not present

## 2020-07-13 DIAGNOSIS — E785 Hyperlipidemia, unspecified: Secondary | ICD-10-CM | POA: Diagnosis not present

## 2020-07-14 DIAGNOSIS — C7951 Secondary malignant neoplasm of bone: Secondary | ICD-10-CM | POA: Diagnosis not present

## 2020-07-14 DIAGNOSIS — R634 Abnormal weight loss: Secondary | ICD-10-CM | POA: Diagnosis not present

## 2020-07-14 DIAGNOSIS — C61 Malignant neoplasm of prostate: Secondary | ICD-10-CM | POA: Diagnosis not present

## 2020-07-14 DIAGNOSIS — G620 Drug-induced polyneuropathy: Secondary | ICD-10-CM | POA: Diagnosis not present

## 2020-07-14 DIAGNOSIS — E785 Hyperlipidemia, unspecified: Secondary | ICD-10-CM | POA: Diagnosis not present

## 2020-07-14 DIAGNOSIS — I25119 Atherosclerotic heart disease of native coronary artery with unspecified angina pectoris: Secondary | ICD-10-CM | POA: Diagnosis not present

## 2020-07-14 DIAGNOSIS — R630 Anorexia: Secondary | ICD-10-CM | POA: Diagnosis not present

## 2020-07-14 DIAGNOSIS — I252 Old myocardial infarction: Secondary | ICD-10-CM | POA: Diagnosis not present

## 2020-07-14 DIAGNOSIS — I10 Essential (primary) hypertension: Secondary | ICD-10-CM | POA: Diagnosis not present

## 2020-07-15 ENCOUNTER — Encounter: Payer: Self-pay | Admitting: *Deleted

## 2020-07-15 ENCOUNTER — Telehealth: Payer: Self-pay | Admitting: *Deleted

## 2020-07-15 DIAGNOSIS — E785 Hyperlipidemia, unspecified: Secondary | ICD-10-CM | POA: Diagnosis not present

## 2020-07-15 DIAGNOSIS — I252 Old myocardial infarction: Secondary | ICD-10-CM | POA: Diagnosis not present

## 2020-07-15 DIAGNOSIS — C61 Malignant neoplasm of prostate: Secondary | ICD-10-CM | POA: Diagnosis not present

## 2020-07-15 DIAGNOSIS — I10 Essential (primary) hypertension: Secondary | ICD-10-CM | POA: Diagnosis not present

## 2020-07-15 DIAGNOSIS — R630 Anorexia: Secondary | ICD-10-CM | POA: Diagnosis not present

## 2020-07-15 DIAGNOSIS — G620 Drug-induced polyneuropathy: Secondary | ICD-10-CM | POA: Diagnosis not present

## 2020-07-15 DIAGNOSIS — R634 Abnormal weight loss: Secondary | ICD-10-CM | POA: Diagnosis not present

## 2020-07-15 DIAGNOSIS — C7951 Secondary malignant neoplasm of bone: Secondary | ICD-10-CM | POA: Diagnosis not present

## 2020-07-15 DIAGNOSIS — I25119 Atherosclerotic heart disease of native coronary artery with unspecified angina pectoris: Secondary | ICD-10-CM | POA: Diagnosis not present

## 2020-07-15 NOTE — Telephone Encounter (Signed)
Pt's Wife dropped off FMLA forms for her daughter "Robert Short" form did not have the patient's date of birth or name of patient. Per staff at front reception desk the patient's wife brought these forms in as well as a handicap renewal application. Robert Short is the pt's daughter.  Robert Short is not listed on any of the patient's DPR. No other information is on this form. I have contacted the patient's wife via mychart. Unable to reach wife (via phone as the phone # just rings).  Before the form can be completed I need to know if the FMLA is for continuous vs intermittent and the start date for the FMLA.  Daughter has never accompanied patient to any of the apts.

## 2020-07-16 ENCOUNTER — Encounter: Payer: Self-pay | Admitting: Internal Medicine

## 2020-07-16 ENCOUNTER — Other Ambulatory Visit: Payer: Self-pay

## 2020-07-16 DIAGNOSIS — R634 Abnormal weight loss: Secondary | ICD-10-CM | POA: Diagnosis not present

## 2020-07-16 DIAGNOSIS — E785 Hyperlipidemia, unspecified: Secondary | ICD-10-CM | POA: Diagnosis not present

## 2020-07-16 DIAGNOSIS — I10 Essential (primary) hypertension: Secondary | ICD-10-CM | POA: Diagnosis not present

## 2020-07-16 DIAGNOSIS — I25119 Atherosclerotic heart disease of native coronary artery with unspecified angina pectoris: Secondary | ICD-10-CM | POA: Diagnosis not present

## 2020-07-16 DIAGNOSIS — I252 Old myocardial infarction: Secondary | ICD-10-CM | POA: Diagnosis not present

## 2020-07-16 DIAGNOSIS — C7951 Secondary malignant neoplasm of bone: Secondary | ICD-10-CM | POA: Diagnosis not present

## 2020-07-16 DIAGNOSIS — R630 Anorexia: Secondary | ICD-10-CM | POA: Diagnosis not present

## 2020-07-16 DIAGNOSIS — G620 Drug-induced polyneuropathy: Secondary | ICD-10-CM | POA: Diagnosis not present

## 2020-07-16 DIAGNOSIS — C61 Malignant neoplasm of prostate: Secondary | ICD-10-CM | POA: Diagnosis not present

## 2020-07-17 DIAGNOSIS — R630 Anorexia: Secondary | ICD-10-CM | POA: Diagnosis not present

## 2020-07-17 DIAGNOSIS — I252 Old myocardial infarction: Secondary | ICD-10-CM | POA: Diagnosis not present

## 2020-07-17 DIAGNOSIS — G620 Drug-induced polyneuropathy: Secondary | ICD-10-CM | POA: Diagnosis not present

## 2020-07-17 DIAGNOSIS — E785 Hyperlipidemia, unspecified: Secondary | ICD-10-CM | POA: Diagnosis not present

## 2020-07-17 DIAGNOSIS — R634 Abnormal weight loss: Secondary | ICD-10-CM | POA: Diagnosis not present

## 2020-07-17 DIAGNOSIS — I10 Essential (primary) hypertension: Secondary | ICD-10-CM | POA: Diagnosis not present

## 2020-07-17 DIAGNOSIS — C7951 Secondary malignant neoplasm of bone: Secondary | ICD-10-CM | POA: Diagnosis not present

## 2020-07-17 DIAGNOSIS — I25119 Atherosclerotic heart disease of native coronary artery with unspecified angina pectoris: Secondary | ICD-10-CM | POA: Diagnosis not present

## 2020-07-17 DIAGNOSIS — C61 Malignant neoplasm of prostate: Secondary | ICD-10-CM | POA: Diagnosis not present

## 2020-07-18 DIAGNOSIS — C7951 Secondary malignant neoplasm of bone: Secondary | ICD-10-CM | POA: Diagnosis not present

## 2020-07-18 DIAGNOSIS — R630 Anorexia: Secondary | ICD-10-CM | POA: Diagnosis not present

## 2020-07-18 DIAGNOSIS — I25119 Atherosclerotic heart disease of native coronary artery with unspecified angina pectoris: Secondary | ICD-10-CM | POA: Diagnosis not present

## 2020-07-18 DIAGNOSIS — I10 Essential (primary) hypertension: Secondary | ICD-10-CM | POA: Diagnosis not present

## 2020-07-18 DIAGNOSIS — C61 Malignant neoplasm of prostate: Secondary | ICD-10-CM | POA: Diagnosis not present

## 2020-07-18 DIAGNOSIS — R634 Abnormal weight loss: Secondary | ICD-10-CM | POA: Diagnosis not present

## 2020-07-18 DIAGNOSIS — G620 Drug-induced polyneuropathy: Secondary | ICD-10-CM | POA: Diagnosis not present

## 2020-07-18 DIAGNOSIS — E785 Hyperlipidemia, unspecified: Secondary | ICD-10-CM | POA: Diagnosis not present

## 2020-07-18 DIAGNOSIS — I252 Old myocardial infarction: Secondary | ICD-10-CM | POA: Diagnosis not present

## 2020-07-19 ENCOUNTER — Other Ambulatory Visit (HOSPITAL_COMMUNITY): Payer: Self-pay

## 2020-07-19 ENCOUNTER — Encounter: Payer: Self-pay | Admitting: *Deleted

## 2020-07-19 DIAGNOSIS — I252 Old myocardial infarction: Secondary | ICD-10-CM | POA: Diagnosis not present

## 2020-07-19 DIAGNOSIS — E785 Hyperlipidemia, unspecified: Secondary | ICD-10-CM | POA: Diagnosis not present

## 2020-07-19 DIAGNOSIS — R634 Abnormal weight loss: Secondary | ICD-10-CM | POA: Diagnosis not present

## 2020-07-19 DIAGNOSIS — C7951 Secondary malignant neoplasm of bone: Secondary | ICD-10-CM | POA: Diagnosis not present

## 2020-07-19 DIAGNOSIS — C61 Malignant neoplasm of prostate: Secondary | ICD-10-CM | POA: Diagnosis not present

## 2020-07-19 DIAGNOSIS — I25119 Atherosclerotic heart disease of native coronary artery with unspecified angina pectoris: Secondary | ICD-10-CM | POA: Diagnosis not present

## 2020-07-19 DIAGNOSIS — I10 Essential (primary) hypertension: Secondary | ICD-10-CM | POA: Diagnosis not present

## 2020-07-19 DIAGNOSIS — G620 Drug-induced polyneuropathy: Secondary | ICD-10-CM | POA: Diagnosis not present

## 2020-07-19 DIAGNOSIS — R630 Anorexia: Secondary | ICD-10-CM | POA: Diagnosis not present

## 2020-07-20 ENCOUNTER — Other Ambulatory Visit: Payer: Self-pay

## 2020-07-20 DIAGNOSIS — I10 Essential (primary) hypertension: Secondary | ICD-10-CM | POA: Diagnosis not present

## 2020-07-20 DIAGNOSIS — I25119 Atherosclerotic heart disease of native coronary artery with unspecified angina pectoris: Secondary | ICD-10-CM | POA: Diagnosis not present

## 2020-07-20 DIAGNOSIS — E785 Hyperlipidemia, unspecified: Secondary | ICD-10-CM | POA: Diagnosis not present

## 2020-07-20 DIAGNOSIS — C61 Malignant neoplasm of prostate: Secondary | ICD-10-CM | POA: Diagnosis not present

## 2020-07-20 DIAGNOSIS — G620 Drug-induced polyneuropathy: Secondary | ICD-10-CM | POA: Diagnosis not present

## 2020-07-20 DIAGNOSIS — I252 Old myocardial infarction: Secondary | ICD-10-CM | POA: Diagnosis not present

## 2020-07-20 DIAGNOSIS — C7951 Secondary malignant neoplasm of bone: Secondary | ICD-10-CM | POA: Diagnosis not present

## 2020-07-20 DIAGNOSIS — R634 Abnormal weight loss: Secondary | ICD-10-CM | POA: Diagnosis not present

## 2020-07-20 DIAGNOSIS — R630 Anorexia: Secondary | ICD-10-CM | POA: Diagnosis not present

## 2020-07-20 MED ORDER — DEXAMETHASONE 4 MG PO TABS
4.0000 mg | ORAL_TABLET | Freq: Two times a day (BID) | ORAL | 4 refills | Status: AC
  Filled 2020-07-20: qty 30, 15d supply, fill #0
  Filled 2020-09-02: qty 30, 15d supply, fill #1
  Filled 2020-10-12: qty 30, 15d supply, fill #2
  Filled 2020-11-16: qty 30, 15d supply, fill #3

## 2020-07-21 DIAGNOSIS — I10 Essential (primary) hypertension: Secondary | ICD-10-CM | POA: Diagnosis not present

## 2020-07-21 DIAGNOSIS — G620 Drug-induced polyneuropathy: Secondary | ICD-10-CM | POA: Diagnosis not present

## 2020-07-21 DIAGNOSIS — R634 Abnormal weight loss: Secondary | ICD-10-CM | POA: Diagnosis not present

## 2020-07-21 DIAGNOSIS — C7951 Secondary malignant neoplasm of bone: Secondary | ICD-10-CM | POA: Diagnosis not present

## 2020-07-21 DIAGNOSIS — R630 Anorexia: Secondary | ICD-10-CM | POA: Diagnosis not present

## 2020-07-21 DIAGNOSIS — I252 Old myocardial infarction: Secondary | ICD-10-CM | POA: Diagnosis not present

## 2020-07-21 DIAGNOSIS — I25119 Atherosclerotic heart disease of native coronary artery with unspecified angina pectoris: Secondary | ICD-10-CM | POA: Diagnosis not present

## 2020-07-21 DIAGNOSIS — C61 Malignant neoplasm of prostate: Secondary | ICD-10-CM | POA: Diagnosis not present

## 2020-07-21 DIAGNOSIS — E785 Hyperlipidemia, unspecified: Secondary | ICD-10-CM | POA: Diagnosis not present

## 2020-07-22 ENCOUNTER — Other Ambulatory Visit: Payer: Self-pay

## 2020-07-22 ENCOUNTER — Other Ambulatory Visit (HOSPITAL_COMMUNITY): Payer: Self-pay

## 2020-07-22 DIAGNOSIS — E785 Hyperlipidemia, unspecified: Secondary | ICD-10-CM | POA: Diagnosis not present

## 2020-07-22 DIAGNOSIS — I252 Old myocardial infarction: Secondary | ICD-10-CM | POA: Diagnosis not present

## 2020-07-22 DIAGNOSIS — I10 Essential (primary) hypertension: Secondary | ICD-10-CM | POA: Diagnosis not present

## 2020-07-22 DIAGNOSIS — R634 Abnormal weight loss: Secondary | ICD-10-CM | POA: Diagnosis not present

## 2020-07-22 DIAGNOSIS — C61 Malignant neoplasm of prostate: Secondary | ICD-10-CM | POA: Diagnosis not present

## 2020-07-22 DIAGNOSIS — C7951 Secondary malignant neoplasm of bone: Secondary | ICD-10-CM | POA: Diagnosis not present

## 2020-07-22 DIAGNOSIS — I25119 Atherosclerotic heart disease of native coronary artery with unspecified angina pectoris: Secondary | ICD-10-CM | POA: Diagnosis not present

## 2020-07-22 DIAGNOSIS — G620 Drug-induced polyneuropathy: Secondary | ICD-10-CM | POA: Diagnosis not present

## 2020-07-22 DIAGNOSIS — R630 Anorexia: Secondary | ICD-10-CM | POA: Diagnosis not present

## 2020-07-23 DIAGNOSIS — R630 Anorexia: Secondary | ICD-10-CM | POA: Diagnosis not present

## 2020-07-23 DIAGNOSIS — E785 Hyperlipidemia, unspecified: Secondary | ICD-10-CM | POA: Diagnosis not present

## 2020-07-23 DIAGNOSIS — I10 Essential (primary) hypertension: Secondary | ICD-10-CM | POA: Diagnosis not present

## 2020-07-23 DIAGNOSIS — G620 Drug-induced polyneuropathy: Secondary | ICD-10-CM | POA: Diagnosis not present

## 2020-07-23 DIAGNOSIS — I25119 Atherosclerotic heart disease of native coronary artery with unspecified angina pectoris: Secondary | ICD-10-CM | POA: Diagnosis not present

## 2020-07-23 DIAGNOSIS — R634 Abnormal weight loss: Secondary | ICD-10-CM | POA: Diagnosis not present

## 2020-07-23 DIAGNOSIS — C61 Malignant neoplasm of prostate: Secondary | ICD-10-CM | POA: Diagnosis not present

## 2020-07-23 DIAGNOSIS — I252 Old myocardial infarction: Secondary | ICD-10-CM | POA: Diagnosis not present

## 2020-07-23 DIAGNOSIS — C7951 Secondary malignant neoplasm of bone: Secondary | ICD-10-CM | POA: Diagnosis not present

## 2020-07-24 DIAGNOSIS — R634 Abnormal weight loss: Secondary | ICD-10-CM | POA: Diagnosis not present

## 2020-07-24 DIAGNOSIS — I252 Old myocardial infarction: Secondary | ICD-10-CM | POA: Diagnosis not present

## 2020-07-24 DIAGNOSIS — E785 Hyperlipidemia, unspecified: Secondary | ICD-10-CM | POA: Diagnosis not present

## 2020-07-24 DIAGNOSIS — C7951 Secondary malignant neoplasm of bone: Secondary | ICD-10-CM | POA: Diagnosis not present

## 2020-07-24 DIAGNOSIS — I25119 Atherosclerotic heart disease of native coronary artery with unspecified angina pectoris: Secondary | ICD-10-CM | POA: Diagnosis not present

## 2020-07-24 DIAGNOSIS — C61 Malignant neoplasm of prostate: Secondary | ICD-10-CM | POA: Diagnosis not present

## 2020-07-24 DIAGNOSIS — R630 Anorexia: Secondary | ICD-10-CM | POA: Diagnosis not present

## 2020-07-24 DIAGNOSIS — I10 Essential (primary) hypertension: Secondary | ICD-10-CM | POA: Diagnosis not present

## 2020-07-24 DIAGNOSIS — G620 Drug-induced polyneuropathy: Secondary | ICD-10-CM | POA: Diagnosis not present

## 2020-07-25 DIAGNOSIS — R630 Anorexia: Secondary | ICD-10-CM | POA: Diagnosis not present

## 2020-07-25 DIAGNOSIS — R634 Abnormal weight loss: Secondary | ICD-10-CM | POA: Diagnosis not present

## 2020-07-25 DIAGNOSIS — G620 Drug-induced polyneuropathy: Secondary | ICD-10-CM | POA: Diagnosis not present

## 2020-07-25 DIAGNOSIS — I25119 Atherosclerotic heart disease of native coronary artery with unspecified angina pectoris: Secondary | ICD-10-CM | POA: Diagnosis not present

## 2020-07-25 DIAGNOSIS — I252 Old myocardial infarction: Secondary | ICD-10-CM | POA: Diagnosis not present

## 2020-07-25 DIAGNOSIS — C61 Malignant neoplasm of prostate: Secondary | ICD-10-CM | POA: Diagnosis not present

## 2020-07-25 DIAGNOSIS — E785 Hyperlipidemia, unspecified: Secondary | ICD-10-CM | POA: Diagnosis not present

## 2020-07-25 DIAGNOSIS — I10 Essential (primary) hypertension: Secondary | ICD-10-CM | POA: Diagnosis not present

## 2020-07-25 DIAGNOSIS — C7951 Secondary malignant neoplasm of bone: Secondary | ICD-10-CM | POA: Diagnosis not present

## 2020-07-26 DIAGNOSIS — I25119 Atherosclerotic heart disease of native coronary artery with unspecified angina pectoris: Secondary | ICD-10-CM | POA: Diagnosis not present

## 2020-07-26 DIAGNOSIS — G620 Drug-induced polyneuropathy: Secondary | ICD-10-CM | POA: Diagnosis not present

## 2020-07-26 DIAGNOSIS — I252 Old myocardial infarction: Secondary | ICD-10-CM | POA: Diagnosis not present

## 2020-07-26 DIAGNOSIS — E785 Hyperlipidemia, unspecified: Secondary | ICD-10-CM | POA: Diagnosis not present

## 2020-07-26 DIAGNOSIS — C61 Malignant neoplasm of prostate: Secondary | ICD-10-CM | POA: Diagnosis not present

## 2020-07-26 DIAGNOSIS — R634 Abnormal weight loss: Secondary | ICD-10-CM | POA: Diagnosis not present

## 2020-07-26 DIAGNOSIS — R630 Anorexia: Secondary | ICD-10-CM | POA: Diagnosis not present

## 2020-07-26 DIAGNOSIS — C7951 Secondary malignant neoplasm of bone: Secondary | ICD-10-CM | POA: Diagnosis not present

## 2020-07-26 DIAGNOSIS — I10 Essential (primary) hypertension: Secondary | ICD-10-CM | POA: Diagnosis not present

## 2020-07-27 DIAGNOSIS — C7951 Secondary malignant neoplasm of bone: Secondary | ICD-10-CM | POA: Diagnosis not present

## 2020-07-27 DIAGNOSIS — E785 Hyperlipidemia, unspecified: Secondary | ICD-10-CM | POA: Diagnosis not present

## 2020-07-27 DIAGNOSIS — I25119 Atherosclerotic heart disease of native coronary artery with unspecified angina pectoris: Secondary | ICD-10-CM | POA: Diagnosis not present

## 2020-07-27 DIAGNOSIS — C61 Malignant neoplasm of prostate: Secondary | ICD-10-CM | POA: Diagnosis not present

## 2020-07-27 DIAGNOSIS — I252 Old myocardial infarction: Secondary | ICD-10-CM | POA: Diagnosis not present

## 2020-07-27 DIAGNOSIS — R630 Anorexia: Secondary | ICD-10-CM | POA: Diagnosis not present

## 2020-07-27 DIAGNOSIS — R634 Abnormal weight loss: Secondary | ICD-10-CM | POA: Diagnosis not present

## 2020-07-27 DIAGNOSIS — G620 Drug-induced polyneuropathy: Secondary | ICD-10-CM | POA: Diagnosis not present

## 2020-07-27 DIAGNOSIS — I10 Essential (primary) hypertension: Secondary | ICD-10-CM | POA: Diagnosis not present

## 2020-07-28 DIAGNOSIS — I10 Essential (primary) hypertension: Secondary | ICD-10-CM | POA: Diagnosis not present

## 2020-07-28 DIAGNOSIS — C61 Malignant neoplasm of prostate: Secondary | ICD-10-CM | POA: Diagnosis not present

## 2020-07-28 DIAGNOSIS — G620 Drug-induced polyneuropathy: Secondary | ICD-10-CM | POA: Diagnosis not present

## 2020-07-28 DIAGNOSIS — C7951 Secondary malignant neoplasm of bone: Secondary | ICD-10-CM | POA: Diagnosis not present

## 2020-07-28 DIAGNOSIS — E785 Hyperlipidemia, unspecified: Secondary | ICD-10-CM | POA: Diagnosis not present

## 2020-07-28 DIAGNOSIS — I252 Old myocardial infarction: Secondary | ICD-10-CM | POA: Diagnosis not present

## 2020-07-28 DIAGNOSIS — R630 Anorexia: Secondary | ICD-10-CM | POA: Diagnosis not present

## 2020-07-28 DIAGNOSIS — I25119 Atherosclerotic heart disease of native coronary artery with unspecified angina pectoris: Secondary | ICD-10-CM | POA: Diagnosis not present

## 2020-07-28 DIAGNOSIS — R634 Abnormal weight loss: Secondary | ICD-10-CM | POA: Diagnosis not present

## 2020-07-29 DIAGNOSIS — R630 Anorexia: Secondary | ICD-10-CM | POA: Diagnosis not present

## 2020-07-29 DIAGNOSIS — C61 Malignant neoplasm of prostate: Secondary | ICD-10-CM | POA: Diagnosis not present

## 2020-07-29 DIAGNOSIS — C7951 Secondary malignant neoplasm of bone: Secondary | ICD-10-CM | POA: Diagnosis not present

## 2020-07-29 DIAGNOSIS — R634 Abnormal weight loss: Secondary | ICD-10-CM | POA: Diagnosis not present

## 2020-07-29 DIAGNOSIS — I10 Essential (primary) hypertension: Secondary | ICD-10-CM | POA: Diagnosis not present

## 2020-07-29 DIAGNOSIS — I25119 Atherosclerotic heart disease of native coronary artery with unspecified angina pectoris: Secondary | ICD-10-CM | POA: Diagnosis not present

## 2020-07-29 DIAGNOSIS — E785 Hyperlipidemia, unspecified: Secondary | ICD-10-CM | POA: Diagnosis not present

## 2020-07-29 DIAGNOSIS — I252 Old myocardial infarction: Secondary | ICD-10-CM | POA: Diagnosis not present

## 2020-07-29 DIAGNOSIS — G620 Drug-induced polyneuropathy: Secondary | ICD-10-CM | POA: Diagnosis not present

## 2020-07-30 ENCOUNTER — Other Ambulatory Visit: Payer: Self-pay

## 2020-07-30 ENCOUNTER — Other Ambulatory Visit: Payer: Self-pay | Admitting: Internal Medicine

## 2020-07-30 DIAGNOSIS — C61 Malignant neoplasm of prostate: Secondary | ICD-10-CM | POA: Diagnosis not present

## 2020-07-30 DIAGNOSIS — G620 Drug-induced polyneuropathy: Secondary | ICD-10-CM | POA: Diagnosis not present

## 2020-07-30 DIAGNOSIS — R630 Anorexia: Secondary | ICD-10-CM | POA: Diagnosis not present

## 2020-07-30 DIAGNOSIS — I10 Essential (primary) hypertension: Secondary | ICD-10-CM | POA: Diagnosis not present

## 2020-07-30 DIAGNOSIS — I252 Old myocardial infarction: Secondary | ICD-10-CM | POA: Diagnosis not present

## 2020-07-30 DIAGNOSIS — C7951 Secondary malignant neoplasm of bone: Secondary | ICD-10-CM | POA: Diagnosis not present

## 2020-07-30 DIAGNOSIS — R634 Abnormal weight loss: Secondary | ICD-10-CM | POA: Diagnosis not present

## 2020-07-30 DIAGNOSIS — I25119 Atherosclerotic heart disease of native coronary artery with unspecified angina pectoris: Secondary | ICD-10-CM | POA: Diagnosis not present

## 2020-07-30 DIAGNOSIS — E785 Hyperlipidemia, unspecified: Secondary | ICD-10-CM | POA: Diagnosis not present

## 2020-07-30 MED ORDER — OXYCODONE HCL 15 MG PO TABS
15.0000 mg | ORAL_TABLET | ORAL | 0 refills | Status: DC | PRN
  Filled 2020-07-30: qty 90, 8d supply, fill #0

## 2020-07-30 MED ORDER — FENTANYL 100 MCG/HR TD PT72
MEDICATED_PATCH | TRANSDERMAL | 0 refills | Status: DC
  Filled 2020-07-30: qty 10, 20d supply, fill #0

## 2020-07-30 MED ORDER — FENTANYL 50 MCG/HR TD PT72
MEDICATED_PATCH | TRANSDERMAL | 0 refills | Status: DC
  Filled 2020-07-30: qty 10, 20d supply, fill #0

## 2020-07-30 MED ORDER — LORAZEPAM 0.5 MG PO TABS
0.5000 mg | ORAL_TABLET | ORAL | 0 refills | Status: DC | PRN
  Filled 2020-07-30: qty 180, 30d supply, fill #0

## 2020-07-31 DIAGNOSIS — I25119 Atherosclerotic heart disease of native coronary artery with unspecified angina pectoris: Secondary | ICD-10-CM | POA: Diagnosis not present

## 2020-07-31 DIAGNOSIS — I10 Essential (primary) hypertension: Secondary | ICD-10-CM | POA: Diagnosis not present

## 2020-07-31 DIAGNOSIS — E785 Hyperlipidemia, unspecified: Secondary | ICD-10-CM | POA: Diagnosis not present

## 2020-07-31 DIAGNOSIS — C61 Malignant neoplasm of prostate: Secondary | ICD-10-CM | POA: Diagnosis not present

## 2020-07-31 DIAGNOSIS — R630 Anorexia: Secondary | ICD-10-CM | POA: Diagnosis not present

## 2020-07-31 DIAGNOSIS — I252 Old myocardial infarction: Secondary | ICD-10-CM | POA: Diagnosis not present

## 2020-07-31 DIAGNOSIS — C7951 Secondary malignant neoplasm of bone: Secondary | ICD-10-CM | POA: Diagnosis not present

## 2020-07-31 DIAGNOSIS — R634 Abnormal weight loss: Secondary | ICD-10-CM | POA: Diagnosis not present

## 2020-07-31 DIAGNOSIS — G620 Drug-induced polyneuropathy: Secondary | ICD-10-CM | POA: Diagnosis not present

## 2020-08-01 DIAGNOSIS — R634 Abnormal weight loss: Secondary | ICD-10-CM | POA: Diagnosis not present

## 2020-08-01 DIAGNOSIS — E785 Hyperlipidemia, unspecified: Secondary | ICD-10-CM | POA: Diagnosis not present

## 2020-08-01 DIAGNOSIS — I25119 Atherosclerotic heart disease of native coronary artery with unspecified angina pectoris: Secondary | ICD-10-CM | POA: Diagnosis not present

## 2020-08-01 DIAGNOSIS — I10 Essential (primary) hypertension: Secondary | ICD-10-CM | POA: Diagnosis not present

## 2020-08-01 DIAGNOSIS — C7951 Secondary malignant neoplasm of bone: Secondary | ICD-10-CM | POA: Diagnosis not present

## 2020-08-01 DIAGNOSIS — C61 Malignant neoplasm of prostate: Secondary | ICD-10-CM | POA: Diagnosis not present

## 2020-08-01 DIAGNOSIS — G620 Drug-induced polyneuropathy: Secondary | ICD-10-CM | POA: Diagnosis not present

## 2020-08-01 DIAGNOSIS — I252 Old myocardial infarction: Secondary | ICD-10-CM | POA: Diagnosis not present

## 2020-08-01 DIAGNOSIS — R630 Anorexia: Secondary | ICD-10-CM | POA: Diagnosis not present

## 2020-08-02 DIAGNOSIS — R634 Abnormal weight loss: Secondary | ICD-10-CM | POA: Diagnosis not present

## 2020-08-02 DIAGNOSIS — I252 Old myocardial infarction: Secondary | ICD-10-CM | POA: Diagnosis not present

## 2020-08-02 DIAGNOSIS — I25119 Atherosclerotic heart disease of native coronary artery with unspecified angina pectoris: Secondary | ICD-10-CM | POA: Diagnosis not present

## 2020-08-02 DIAGNOSIS — C7951 Secondary malignant neoplasm of bone: Secondary | ICD-10-CM | POA: Diagnosis not present

## 2020-08-02 DIAGNOSIS — I10 Essential (primary) hypertension: Secondary | ICD-10-CM | POA: Diagnosis not present

## 2020-08-02 DIAGNOSIS — G620 Drug-induced polyneuropathy: Secondary | ICD-10-CM | POA: Diagnosis not present

## 2020-08-02 DIAGNOSIS — C61 Malignant neoplasm of prostate: Secondary | ICD-10-CM | POA: Diagnosis not present

## 2020-08-02 DIAGNOSIS — E785 Hyperlipidemia, unspecified: Secondary | ICD-10-CM | POA: Diagnosis not present

## 2020-08-02 DIAGNOSIS — R630 Anorexia: Secondary | ICD-10-CM | POA: Diagnosis not present

## 2020-08-03 DIAGNOSIS — I10 Essential (primary) hypertension: Secondary | ICD-10-CM | POA: Diagnosis not present

## 2020-08-03 DIAGNOSIS — I25119 Atherosclerotic heart disease of native coronary artery with unspecified angina pectoris: Secondary | ICD-10-CM | POA: Diagnosis not present

## 2020-08-03 DIAGNOSIS — C61 Malignant neoplasm of prostate: Secondary | ICD-10-CM | POA: Diagnosis not present

## 2020-08-03 DIAGNOSIS — I252 Old myocardial infarction: Secondary | ICD-10-CM | POA: Diagnosis not present

## 2020-08-03 DIAGNOSIS — R630 Anorexia: Secondary | ICD-10-CM | POA: Diagnosis not present

## 2020-08-03 DIAGNOSIS — G620 Drug-induced polyneuropathy: Secondary | ICD-10-CM | POA: Diagnosis not present

## 2020-08-03 DIAGNOSIS — R634 Abnormal weight loss: Secondary | ICD-10-CM | POA: Diagnosis not present

## 2020-08-03 DIAGNOSIS — C7951 Secondary malignant neoplasm of bone: Secondary | ICD-10-CM | POA: Diagnosis not present

## 2020-08-03 DIAGNOSIS — E785 Hyperlipidemia, unspecified: Secondary | ICD-10-CM | POA: Diagnosis not present

## 2020-08-04 DIAGNOSIS — C61 Malignant neoplasm of prostate: Secondary | ICD-10-CM | POA: Diagnosis not present

## 2020-08-04 DIAGNOSIS — G620 Drug-induced polyneuropathy: Secondary | ICD-10-CM | POA: Diagnosis not present

## 2020-08-04 DIAGNOSIS — R634 Abnormal weight loss: Secondary | ICD-10-CM | POA: Diagnosis not present

## 2020-08-04 DIAGNOSIS — E785 Hyperlipidemia, unspecified: Secondary | ICD-10-CM | POA: Diagnosis not present

## 2020-08-04 DIAGNOSIS — C7951 Secondary malignant neoplasm of bone: Secondary | ICD-10-CM | POA: Diagnosis not present

## 2020-08-04 DIAGNOSIS — I25119 Atherosclerotic heart disease of native coronary artery with unspecified angina pectoris: Secondary | ICD-10-CM | POA: Diagnosis not present

## 2020-08-04 DIAGNOSIS — R630 Anorexia: Secondary | ICD-10-CM | POA: Diagnosis not present

## 2020-08-04 DIAGNOSIS — I10 Essential (primary) hypertension: Secondary | ICD-10-CM | POA: Diagnosis not present

## 2020-08-04 DIAGNOSIS — I252 Old myocardial infarction: Secondary | ICD-10-CM | POA: Diagnosis not present

## 2020-08-05 DIAGNOSIS — E785 Hyperlipidemia, unspecified: Secondary | ICD-10-CM | POA: Diagnosis not present

## 2020-08-05 DIAGNOSIS — C61 Malignant neoplasm of prostate: Secondary | ICD-10-CM | POA: Diagnosis not present

## 2020-08-05 DIAGNOSIS — C7951 Secondary malignant neoplasm of bone: Secondary | ICD-10-CM | POA: Diagnosis not present

## 2020-08-05 DIAGNOSIS — R634 Abnormal weight loss: Secondary | ICD-10-CM | POA: Diagnosis not present

## 2020-08-05 DIAGNOSIS — R630 Anorexia: Secondary | ICD-10-CM | POA: Diagnosis not present

## 2020-08-05 DIAGNOSIS — I25119 Atherosclerotic heart disease of native coronary artery with unspecified angina pectoris: Secondary | ICD-10-CM | POA: Diagnosis not present

## 2020-08-05 DIAGNOSIS — G620 Drug-induced polyneuropathy: Secondary | ICD-10-CM | POA: Diagnosis not present

## 2020-08-05 DIAGNOSIS — I252 Old myocardial infarction: Secondary | ICD-10-CM | POA: Diagnosis not present

## 2020-08-05 DIAGNOSIS — I10 Essential (primary) hypertension: Secondary | ICD-10-CM | POA: Diagnosis not present

## 2020-08-06 DIAGNOSIS — G620 Drug-induced polyneuropathy: Secondary | ICD-10-CM | POA: Diagnosis not present

## 2020-08-06 DIAGNOSIS — I25119 Atherosclerotic heart disease of native coronary artery with unspecified angina pectoris: Secondary | ICD-10-CM | POA: Diagnosis not present

## 2020-08-06 DIAGNOSIS — R630 Anorexia: Secondary | ICD-10-CM | POA: Diagnosis not present

## 2020-08-06 DIAGNOSIS — E785 Hyperlipidemia, unspecified: Secondary | ICD-10-CM | POA: Diagnosis not present

## 2020-08-06 DIAGNOSIS — I252 Old myocardial infarction: Secondary | ICD-10-CM | POA: Diagnosis not present

## 2020-08-06 DIAGNOSIS — I10 Essential (primary) hypertension: Secondary | ICD-10-CM | POA: Diagnosis not present

## 2020-08-06 DIAGNOSIS — C7951 Secondary malignant neoplasm of bone: Secondary | ICD-10-CM | POA: Diagnosis not present

## 2020-08-06 DIAGNOSIS — C61 Malignant neoplasm of prostate: Secondary | ICD-10-CM | POA: Diagnosis not present

## 2020-08-06 DIAGNOSIS — R634 Abnormal weight loss: Secondary | ICD-10-CM | POA: Diagnosis not present

## 2020-08-07 DIAGNOSIS — I252 Old myocardial infarction: Secondary | ICD-10-CM | POA: Diagnosis not present

## 2020-08-07 DIAGNOSIS — I25119 Atherosclerotic heart disease of native coronary artery with unspecified angina pectoris: Secondary | ICD-10-CM | POA: Diagnosis not present

## 2020-08-07 DIAGNOSIS — G620 Drug-induced polyneuropathy: Secondary | ICD-10-CM | POA: Diagnosis not present

## 2020-08-07 DIAGNOSIS — R634 Abnormal weight loss: Secondary | ICD-10-CM | POA: Diagnosis not present

## 2020-08-07 DIAGNOSIS — I10 Essential (primary) hypertension: Secondary | ICD-10-CM | POA: Diagnosis not present

## 2020-08-07 DIAGNOSIS — C61 Malignant neoplasm of prostate: Secondary | ICD-10-CM | POA: Diagnosis not present

## 2020-08-07 DIAGNOSIS — R630 Anorexia: Secondary | ICD-10-CM | POA: Diagnosis not present

## 2020-08-07 DIAGNOSIS — C7951 Secondary malignant neoplasm of bone: Secondary | ICD-10-CM | POA: Diagnosis not present

## 2020-08-07 DIAGNOSIS — E785 Hyperlipidemia, unspecified: Secondary | ICD-10-CM | POA: Diagnosis not present

## 2020-08-08 DIAGNOSIS — I10 Essential (primary) hypertension: Secondary | ICD-10-CM | POA: Diagnosis not present

## 2020-08-08 DIAGNOSIS — C61 Malignant neoplasm of prostate: Secondary | ICD-10-CM | POA: Diagnosis not present

## 2020-08-08 DIAGNOSIS — C7951 Secondary malignant neoplasm of bone: Secondary | ICD-10-CM | POA: Diagnosis not present

## 2020-08-08 DIAGNOSIS — E785 Hyperlipidemia, unspecified: Secondary | ICD-10-CM | POA: Diagnosis not present

## 2020-08-08 DIAGNOSIS — R634 Abnormal weight loss: Secondary | ICD-10-CM | POA: Diagnosis not present

## 2020-08-08 DIAGNOSIS — G620 Drug-induced polyneuropathy: Secondary | ICD-10-CM | POA: Diagnosis not present

## 2020-08-08 DIAGNOSIS — R630 Anorexia: Secondary | ICD-10-CM | POA: Diagnosis not present

## 2020-08-08 DIAGNOSIS — I252 Old myocardial infarction: Secondary | ICD-10-CM | POA: Diagnosis not present

## 2020-08-08 DIAGNOSIS — I25119 Atherosclerotic heart disease of native coronary artery with unspecified angina pectoris: Secondary | ICD-10-CM | POA: Diagnosis not present

## 2020-08-09 DIAGNOSIS — G620 Drug-induced polyneuropathy: Secondary | ICD-10-CM | POA: Diagnosis not present

## 2020-08-09 DIAGNOSIS — C7951 Secondary malignant neoplasm of bone: Secondary | ICD-10-CM | POA: Diagnosis not present

## 2020-08-09 DIAGNOSIS — R630 Anorexia: Secondary | ICD-10-CM | POA: Diagnosis not present

## 2020-08-09 DIAGNOSIS — I25119 Atherosclerotic heart disease of native coronary artery with unspecified angina pectoris: Secondary | ICD-10-CM | POA: Diagnosis not present

## 2020-08-09 DIAGNOSIS — R634 Abnormal weight loss: Secondary | ICD-10-CM | POA: Diagnosis not present

## 2020-08-09 DIAGNOSIS — I252 Old myocardial infarction: Secondary | ICD-10-CM | POA: Diagnosis not present

## 2020-08-09 DIAGNOSIS — E785 Hyperlipidemia, unspecified: Secondary | ICD-10-CM | POA: Diagnosis not present

## 2020-08-09 DIAGNOSIS — I10 Essential (primary) hypertension: Secondary | ICD-10-CM | POA: Diagnosis not present

## 2020-08-09 DIAGNOSIS — C61 Malignant neoplasm of prostate: Secondary | ICD-10-CM | POA: Diagnosis not present

## 2020-08-10 DIAGNOSIS — G620 Drug-induced polyneuropathy: Secondary | ICD-10-CM | POA: Diagnosis not present

## 2020-08-10 DIAGNOSIS — C61 Malignant neoplasm of prostate: Secondary | ICD-10-CM | POA: Diagnosis not present

## 2020-08-10 DIAGNOSIS — R634 Abnormal weight loss: Secondary | ICD-10-CM | POA: Diagnosis not present

## 2020-08-10 DIAGNOSIS — C7951 Secondary malignant neoplasm of bone: Secondary | ICD-10-CM | POA: Diagnosis not present

## 2020-08-10 DIAGNOSIS — E785 Hyperlipidemia, unspecified: Secondary | ICD-10-CM | POA: Diagnosis not present

## 2020-08-10 DIAGNOSIS — I10 Essential (primary) hypertension: Secondary | ICD-10-CM | POA: Diagnosis not present

## 2020-08-10 DIAGNOSIS — I252 Old myocardial infarction: Secondary | ICD-10-CM | POA: Diagnosis not present

## 2020-08-10 DIAGNOSIS — R630 Anorexia: Secondary | ICD-10-CM | POA: Diagnosis not present

## 2020-08-10 DIAGNOSIS — I25119 Atherosclerotic heart disease of native coronary artery with unspecified angina pectoris: Secondary | ICD-10-CM | POA: Diagnosis not present

## 2020-08-11 ENCOUNTER — Other Ambulatory Visit: Payer: Self-pay

## 2020-08-11 DIAGNOSIS — R630 Anorexia: Secondary | ICD-10-CM | POA: Diagnosis not present

## 2020-08-11 DIAGNOSIS — C7951 Secondary malignant neoplasm of bone: Secondary | ICD-10-CM | POA: Diagnosis not present

## 2020-08-11 DIAGNOSIS — C61 Malignant neoplasm of prostate: Secondary | ICD-10-CM | POA: Diagnosis not present

## 2020-08-11 DIAGNOSIS — G620 Drug-induced polyneuropathy: Secondary | ICD-10-CM | POA: Diagnosis not present

## 2020-08-11 DIAGNOSIS — I252 Old myocardial infarction: Secondary | ICD-10-CM | POA: Diagnosis not present

## 2020-08-11 DIAGNOSIS — R634 Abnormal weight loss: Secondary | ICD-10-CM | POA: Diagnosis not present

## 2020-08-11 DIAGNOSIS — E785 Hyperlipidemia, unspecified: Secondary | ICD-10-CM | POA: Diagnosis not present

## 2020-08-11 DIAGNOSIS — I10 Essential (primary) hypertension: Secondary | ICD-10-CM | POA: Diagnosis not present

## 2020-08-11 DIAGNOSIS — I25119 Atherosclerotic heart disease of native coronary artery with unspecified angina pectoris: Secondary | ICD-10-CM | POA: Diagnosis not present

## 2020-08-12 DIAGNOSIS — G620 Drug-induced polyneuropathy: Secondary | ICD-10-CM | POA: Diagnosis not present

## 2020-08-12 DIAGNOSIS — C7951 Secondary malignant neoplasm of bone: Secondary | ICD-10-CM | POA: Diagnosis not present

## 2020-08-12 DIAGNOSIS — C61 Malignant neoplasm of prostate: Secondary | ICD-10-CM | POA: Diagnosis not present

## 2020-08-12 DIAGNOSIS — I25119 Atherosclerotic heart disease of native coronary artery with unspecified angina pectoris: Secondary | ICD-10-CM | POA: Diagnosis not present

## 2020-08-12 DIAGNOSIS — I252 Old myocardial infarction: Secondary | ICD-10-CM | POA: Diagnosis not present

## 2020-08-12 DIAGNOSIS — R630 Anorexia: Secondary | ICD-10-CM | POA: Diagnosis not present

## 2020-08-12 DIAGNOSIS — R634 Abnormal weight loss: Secondary | ICD-10-CM | POA: Diagnosis not present

## 2020-08-12 DIAGNOSIS — I10 Essential (primary) hypertension: Secondary | ICD-10-CM | POA: Diagnosis not present

## 2020-08-12 DIAGNOSIS — E785 Hyperlipidemia, unspecified: Secondary | ICD-10-CM | POA: Diagnosis not present

## 2020-08-13 ENCOUNTER — Other Ambulatory Visit: Payer: Self-pay

## 2020-08-13 DIAGNOSIS — R630 Anorexia: Secondary | ICD-10-CM | POA: Diagnosis not present

## 2020-08-13 DIAGNOSIS — G620 Drug-induced polyneuropathy: Secondary | ICD-10-CM | POA: Diagnosis not present

## 2020-08-13 DIAGNOSIS — R634 Abnormal weight loss: Secondary | ICD-10-CM | POA: Diagnosis not present

## 2020-08-13 DIAGNOSIS — C61 Malignant neoplasm of prostate: Secondary | ICD-10-CM | POA: Diagnosis not present

## 2020-08-13 DIAGNOSIS — I10 Essential (primary) hypertension: Secondary | ICD-10-CM | POA: Diagnosis not present

## 2020-08-13 DIAGNOSIS — I25119 Atherosclerotic heart disease of native coronary artery with unspecified angina pectoris: Secondary | ICD-10-CM | POA: Diagnosis not present

## 2020-08-13 DIAGNOSIS — C7951 Secondary malignant neoplasm of bone: Secondary | ICD-10-CM | POA: Diagnosis not present

## 2020-08-13 DIAGNOSIS — I252 Old myocardial infarction: Secondary | ICD-10-CM | POA: Diagnosis not present

## 2020-08-13 DIAGNOSIS — E785 Hyperlipidemia, unspecified: Secondary | ICD-10-CM | POA: Diagnosis not present

## 2020-08-14 DIAGNOSIS — I25119 Atherosclerotic heart disease of native coronary artery with unspecified angina pectoris: Secondary | ICD-10-CM | POA: Diagnosis not present

## 2020-08-14 DIAGNOSIS — C7951 Secondary malignant neoplasm of bone: Secondary | ICD-10-CM | POA: Diagnosis not present

## 2020-08-14 DIAGNOSIS — I10 Essential (primary) hypertension: Secondary | ICD-10-CM | POA: Diagnosis not present

## 2020-08-14 DIAGNOSIS — E785 Hyperlipidemia, unspecified: Secondary | ICD-10-CM | POA: Diagnosis not present

## 2020-08-14 DIAGNOSIS — I252 Old myocardial infarction: Secondary | ICD-10-CM | POA: Diagnosis not present

## 2020-08-14 DIAGNOSIS — R634 Abnormal weight loss: Secondary | ICD-10-CM | POA: Diagnosis not present

## 2020-08-14 DIAGNOSIS — R630 Anorexia: Secondary | ICD-10-CM | POA: Diagnosis not present

## 2020-08-14 DIAGNOSIS — G620 Drug-induced polyneuropathy: Secondary | ICD-10-CM | POA: Diagnosis not present

## 2020-08-14 DIAGNOSIS — C61 Malignant neoplasm of prostate: Secondary | ICD-10-CM | POA: Diagnosis not present

## 2020-08-15 DIAGNOSIS — R630 Anorexia: Secondary | ICD-10-CM | POA: Diagnosis not present

## 2020-08-15 DIAGNOSIS — C7951 Secondary malignant neoplasm of bone: Secondary | ICD-10-CM | POA: Diagnosis not present

## 2020-08-15 DIAGNOSIS — R634 Abnormal weight loss: Secondary | ICD-10-CM | POA: Diagnosis not present

## 2020-08-15 DIAGNOSIS — I10 Essential (primary) hypertension: Secondary | ICD-10-CM | POA: Diagnosis not present

## 2020-08-15 DIAGNOSIS — C61 Malignant neoplasm of prostate: Secondary | ICD-10-CM | POA: Diagnosis not present

## 2020-08-15 DIAGNOSIS — E785 Hyperlipidemia, unspecified: Secondary | ICD-10-CM | POA: Diagnosis not present

## 2020-08-15 DIAGNOSIS — I252 Old myocardial infarction: Secondary | ICD-10-CM | POA: Diagnosis not present

## 2020-08-15 DIAGNOSIS — I25119 Atherosclerotic heart disease of native coronary artery with unspecified angina pectoris: Secondary | ICD-10-CM | POA: Diagnosis not present

## 2020-08-15 DIAGNOSIS — G620 Drug-induced polyneuropathy: Secondary | ICD-10-CM | POA: Diagnosis not present

## 2020-08-16 ENCOUNTER — Other Ambulatory Visit: Payer: Self-pay | Admitting: Hospice and Palliative Medicine

## 2020-08-16 ENCOUNTER — Other Ambulatory Visit: Payer: Self-pay

## 2020-08-16 DIAGNOSIS — C7951 Secondary malignant neoplasm of bone: Secondary | ICD-10-CM | POA: Diagnosis not present

## 2020-08-16 DIAGNOSIS — I10 Essential (primary) hypertension: Secondary | ICD-10-CM | POA: Diagnosis not present

## 2020-08-16 DIAGNOSIS — I252 Old myocardial infarction: Secondary | ICD-10-CM | POA: Diagnosis not present

## 2020-08-16 DIAGNOSIS — C61 Malignant neoplasm of prostate: Secondary | ICD-10-CM | POA: Diagnosis not present

## 2020-08-16 DIAGNOSIS — I25119 Atherosclerotic heart disease of native coronary artery with unspecified angina pectoris: Secondary | ICD-10-CM | POA: Diagnosis not present

## 2020-08-16 DIAGNOSIS — G620 Drug-induced polyneuropathy: Secondary | ICD-10-CM | POA: Diagnosis not present

## 2020-08-16 DIAGNOSIS — R630 Anorexia: Secondary | ICD-10-CM | POA: Diagnosis not present

## 2020-08-16 DIAGNOSIS — E785 Hyperlipidemia, unspecified: Secondary | ICD-10-CM | POA: Diagnosis not present

## 2020-08-16 DIAGNOSIS — R634 Abnormal weight loss: Secondary | ICD-10-CM | POA: Diagnosis not present

## 2020-08-16 MED ORDER — OXYCODONE HCL 15 MG PO TABS
15.0000 mg | ORAL_TABLET | ORAL | 0 refills | Status: DC | PRN
  Filled 2020-08-16: qty 90, 8d supply, fill #0

## 2020-08-16 MED ORDER — GABAPENTIN 300 MG PO CAPS
ORAL_CAPSULE | Freq: Three times a day (TID) | ORAL | 2 refills | Status: AC
  Filled 2020-08-16: qty 90, 30d supply, fill #0
  Filled 2020-09-09: qty 90, 30d supply, fill #1
  Filled 2020-11-06: qty 90, 30d supply, fill #2

## 2020-08-16 MED ORDER — FLUCONAZOLE 100 MG PO TABS
ORAL_TABLET | ORAL | 0 refills | Status: DC
  Filled 2020-08-16: qty 15, 14d supply, fill #0

## 2020-08-16 MED ORDER — FENTANYL 100 MCG/HR TD PT72
MEDICATED_PATCH | TRANSDERMAL | 0 refills | Status: DC
  Filled 2020-08-16: qty 10, 20d supply, fill #0

## 2020-08-16 MED ORDER — FENTANYL 50 MCG/HR TD PT72
MEDICATED_PATCH | TRANSDERMAL | 0 refills | Status: DC
  Filled 2020-08-16: qty 10, 20d supply, fill #0

## 2020-08-16 NOTE — Progress Notes (Signed)
I spoke with patient's hospice nurse.  Refills sent to pharmacy for oxycodone and gabapentin.

## 2020-08-16 NOTE — Progress Notes (Signed)
I spoke with patient's wife.  She requested refill of transdermal fentanyl which would be due at the end of this week.  She also says that patient continues to have persistent oral thrush that is not improving on oral nystatin.  We will start patient on Diflucan.

## 2020-08-17 DIAGNOSIS — C7951 Secondary malignant neoplasm of bone: Secondary | ICD-10-CM | POA: Diagnosis not present

## 2020-08-17 DIAGNOSIS — E785 Hyperlipidemia, unspecified: Secondary | ICD-10-CM | POA: Diagnosis not present

## 2020-08-17 DIAGNOSIS — G620 Drug-induced polyneuropathy: Secondary | ICD-10-CM | POA: Diagnosis not present

## 2020-08-17 DIAGNOSIS — I25119 Atherosclerotic heart disease of native coronary artery with unspecified angina pectoris: Secondary | ICD-10-CM | POA: Diagnosis not present

## 2020-08-17 DIAGNOSIS — R634 Abnormal weight loss: Secondary | ICD-10-CM | POA: Diagnosis not present

## 2020-08-17 DIAGNOSIS — R630 Anorexia: Secondary | ICD-10-CM | POA: Diagnosis not present

## 2020-08-17 DIAGNOSIS — C61 Malignant neoplasm of prostate: Secondary | ICD-10-CM | POA: Diagnosis not present

## 2020-08-17 DIAGNOSIS — I252 Old myocardial infarction: Secondary | ICD-10-CM | POA: Diagnosis not present

## 2020-08-17 DIAGNOSIS — I10 Essential (primary) hypertension: Secondary | ICD-10-CM | POA: Diagnosis not present

## 2020-08-18 DIAGNOSIS — I252 Old myocardial infarction: Secondary | ICD-10-CM | POA: Diagnosis not present

## 2020-08-18 DIAGNOSIS — R630 Anorexia: Secondary | ICD-10-CM | POA: Diagnosis not present

## 2020-08-18 DIAGNOSIS — I10 Essential (primary) hypertension: Secondary | ICD-10-CM | POA: Diagnosis not present

## 2020-08-18 DIAGNOSIS — C7951 Secondary malignant neoplasm of bone: Secondary | ICD-10-CM | POA: Diagnosis not present

## 2020-08-18 DIAGNOSIS — R634 Abnormal weight loss: Secondary | ICD-10-CM | POA: Diagnosis not present

## 2020-08-18 DIAGNOSIS — C61 Malignant neoplasm of prostate: Secondary | ICD-10-CM | POA: Diagnosis not present

## 2020-08-18 DIAGNOSIS — G620 Drug-induced polyneuropathy: Secondary | ICD-10-CM | POA: Diagnosis not present

## 2020-08-18 DIAGNOSIS — I25119 Atherosclerotic heart disease of native coronary artery with unspecified angina pectoris: Secondary | ICD-10-CM | POA: Diagnosis not present

## 2020-08-18 DIAGNOSIS — E785 Hyperlipidemia, unspecified: Secondary | ICD-10-CM | POA: Diagnosis not present

## 2020-08-19 ENCOUNTER — Other Ambulatory Visit: Payer: Self-pay

## 2020-08-19 DIAGNOSIS — E785 Hyperlipidemia, unspecified: Secondary | ICD-10-CM | POA: Diagnosis not present

## 2020-08-19 DIAGNOSIS — R634 Abnormal weight loss: Secondary | ICD-10-CM | POA: Diagnosis not present

## 2020-08-19 DIAGNOSIS — R630 Anorexia: Secondary | ICD-10-CM | POA: Diagnosis not present

## 2020-08-19 DIAGNOSIS — G620 Drug-induced polyneuropathy: Secondary | ICD-10-CM | POA: Diagnosis not present

## 2020-08-19 DIAGNOSIS — I25119 Atherosclerotic heart disease of native coronary artery with unspecified angina pectoris: Secondary | ICD-10-CM | POA: Diagnosis not present

## 2020-08-19 DIAGNOSIS — C7951 Secondary malignant neoplasm of bone: Secondary | ICD-10-CM | POA: Diagnosis not present

## 2020-08-19 DIAGNOSIS — I10 Essential (primary) hypertension: Secondary | ICD-10-CM | POA: Diagnosis not present

## 2020-08-19 DIAGNOSIS — I252 Old myocardial infarction: Secondary | ICD-10-CM | POA: Diagnosis not present

## 2020-08-19 DIAGNOSIS — C61 Malignant neoplasm of prostate: Secondary | ICD-10-CM | POA: Diagnosis not present

## 2020-08-20 DIAGNOSIS — I252 Old myocardial infarction: Secondary | ICD-10-CM | POA: Diagnosis not present

## 2020-08-20 DIAGNOSIS — R630 Anorexia: Secondary | ICD-10-CM | POA: Diagnosis not present

## 2020-08-20 DIAGNOSIS — I10 Essential (primary) hypertension: Secondary | ICD-10-CM | POA: Diagnosis not present

## 2020-08-20 DIAGNOSIS — E785 Hyperlipidemia, unspecified: Secondary | ICD-10-CM | POA: Diagnosis not present

## 2020-08-20 DIAGNOSIS — C61 Malignant neoplasm of prostate: Secondary | ICD-10-CM | POA: Diagnosis not present

## 2020-08-20 DIAGNOSIS — I25119 Atherosclerotic heart disease of native coronary artery with unspecified angina pectoris: Secondary | ICD-10-CM | POA: Diagnosis not present

## 2020-08-20 DIAGNOSIS — C7951 Secondary malignant neoplasm of bone: Secondary | ICD-10-CM | POA: Diagnosis not present

## 2020-08-20 DIAGNOSIS — G620 Drug-induced polyneuropathy: Secondary | ICD-10-CM | POA: Diagnosis not present

## 2020-08-20 DIAGNOSIS — R634 Abnormal weight loss: Secondary | ICD-10-CM | POA: Diagnosis not present

## 2020-08-21 DIAGNOSIS — C61 Malignant neoplasm of prostate: Secondary | ICD-10-CM | POA: Diagnosis not present

## 2020-08-21 DIAGNOSIS — I10 Essential (primary) hypertension: Secondary | ICD-10-CM | POA: Diagnosis not present

## 2020-08-21 DIAGNOSIS — I252 Old myocardial infarction: Secondary | ICD-10-CM | POA: Diagnosis not present

## 2020-08-21 DIAGNOSIS — R630 Anorexia: Secondary | ICD-10-CM | POA: Diagnosis not present

## 2020-08-21 DIAGNOSIS — E785 Hyperlipidemia, unspecified: Secondary | ICD-10-CM | POA: Diagnosis not present

## 2020-08-21 DIAGNOSIS — C7951 Secondary malignant neoplasm of bone: Secondary | ICD-10-CM | POA: Diagnosis not present

## 2020-08-21 DIAGNOSIS — R634 Abnormal weight loss: Secondary | ICD-10-CM | POA: Diagnosis not present

## 2020-08-21 DIAGNOSIS — G620 Drug-induced polyneuropathy: Secondary | ICD-10-CM | POA: Diagnosis not present

## 2020-08-21 DIAGNOSIS — I25119 Atherosclerotic heart disease of native coronary artery with unspecified angina pectoris: Secondary | ICD-10-CM | POA: Diagnosis not present

## 2020-08-22 DIAGNOSIS — C61 Malignant neoplasm of prostate: Secondary | ICD-10-CM | POA: Diagnosis not present

## 2020-08-22 DIAGNOSIS — C7951 Secondary malignant neoplasm of bone: Secondary | ICD-10-CM | POA: Diagnosis not present

## 2020-08-22 DIAGNOSIS — I25119 Atherosclerotic heart disease of native coronary artery with unspecified angina pectoris: Secondary | ICD-10-CM | POA: Diagnosis not present

## 2020-08-22 DIAGNOSIS — R634 Abnormal weight loss: Secondary | ICD-10-CM | POA: Diagnosis not present

## 2020-08-22 DIAGNOSIS — I252 Old myocardial infarction: Secondary | ICD-10-CM | POA: Diagnosis not present

## 2020-08-22 DIAGNOSIS — R630 Anorexia: Secondary | ICD-10-CM | POA: Diagnosis not present

## 2020-08-22 DIAGNOSIS — E785 Hyperlipidemia, unspecified: Secondary | ICD-10-CM | POA: Diagnosis not present

## 2020-08-22 DIAGNOSIS — I10 Essential (primary) hypertension: Secondary | ICD-10-CM | POA: Diagnosis not present

## 2020-08-22 DIAGNOSIS — G620 Drug-induced polyneuropathy: Secondary | ICD-10-CM | POA: Diagnosis not present

## 2020-08-23 DIAGNOSIS — G620 Drug-induced polyneuropathy: Secondary | ICD-10-CM | POA: Diagnosis not present

## 2020-08-23 DIAGNOSIS — E785 Hyperlipidemia, unspecified: Secondary | ICD-10-CM | POA: Diagnosis not present

## 2020-08-23 DIAGNOSIS — C61 Malignant neoplasm of prostate: Secondary | ICD-10-CM | POA: Diagnosis not present

## 2020-08-23 DIAGNOSIS — I252 Old myocardial infarction: Secondary | ICD-10-CM | POA: Diagnosis not present

## 2020-08-23 DIAGNOSIS — I10 Essential (primary) hypertension: Secondary | ICD-10-CM | POA: Diagnosis not present

## 2020-08-23 DIAGNOSIS — C7951 Secondary malignant neoplasm of bone: Secondary | ICD-10-CM | POA: Diagnosis not present

## 2020-08-23 DIAGNOSIS — I25119 Atherosclerotic heart disease of native coronary artery with unspecified angina pectoris: Secondary | ICD-10-CM | POA: Diagnosis not present

## 2020-08-23 DIAGNOSIS — R634 Abnormal weight loss: Secondary | ICD-10-CM | POA: Diagnosis not present

## 2020-08-23 DIAGNOSIS — R630 Anorexia: Secondary | ICD-10-CM | POA: Diagnosis not present

## 2020-08-24 DIAGNOSIS — G620 Drug-induced polyneuropathy: Secondary | ICD-10-CM | POA: Diagnosis not present

## 2020-08-24 DIAGNOSIS — R630 Anorexia: Secondary | ICD-10-CM | POA: Diagnosis not present

## 2020-08-24 DIAGNOSIS — I252 Old myocardial infarction: Secondary | ICD-10-CM | POA: Diagnosis not present

## 2020-08-24 DIAGNOSIS — C61 Malignant neoplasm of prostate: Secondary | ICD-10-CM | POA: Diagnosis not present

## 2020-08-24 DIAGNOSIS — C7951 Secondary malignant neoplasm of bone: Secondary | ICD-10-CM | POA: Diagnosis not present

## 2020-08-24 DIAGNOSIS — E785 Hyperlipidemia, unspecified: Secondary | ICD-10-CM | POA: Diagnosis not present

## 2020-08-24 DIAGNOSIS — I10 Essential (primary) hypertension: Secondary | ICD-10-CM | POA: Diagnosis not present

## 2020-08-24 DIAGNOSIS — I25119 Atherosclerotic heart disease of native coronary artery with unspecified angina pectoris: Secondary | ICD-10-CM | POA: Diagnosis not present

## 2020-08-24 DIAGNOSIS — R634 Abnormal weight loss: Secondary | ICD-10-CM | POA: Diagnosis not present

## 2020-08-25 DIAGNOSIS — E785 Hyperlipidemia, unspecified: Secondary | ICD-10-CM | POA: Diagnosis not present

## 2020-08-25 DIAGNOSIS — C7951 Secondary malignant neoplasm of bone: Secondary | ICD-10-CM | POA: Diagnosis not present

## 2020-08-25 DIAGNOSIS — G620 Drug-induced polyneuropathy: Secondary | ICD-10-CM | POA: Diagnosis not present

## 2020-08-25 DIAGNOSIS — R630 Anorexia: Secondary | ICD-10-CM | POA: Diagnosis not present

## 2020-08-25 DIAGNOSIS — I252 Old myocardial infarction: Secondary | ICD-10-CM | POA: Diagnosis not present

## 2020-08-25 DIAGNOSIS — I25119 Atherosclerotic heart disease of native coronary artery with unspecified angina pectoris: Secondary | ICD-10-CM | POA: Diagnosis not present

## 2020-08-25 DIAGNOSIS — I10 Essential (primary) hypertension: Secondary | ICD-10-CM | POA: Diagnosis not present

## 2020-08-25 DIAGNOSIS — C61 Malignant neoplasm of prostate: Secondary | ICD-10-CM | POA: Diagnosis not present

## 2020-08-25 DIAGNOSIS — R634 Abnormal weight loss: Secondary | ICD-10-CM | POA: Diagnosis not present

## 2020-08-26 DIAGNOSIS — C7951 Secondary malignant neoplasm of bone: Secondary | ICD-10-CM | POA: Diagnosis not present

## 2020-08-26 DIAGNOSIS — R634 Abnormal weight loss: Secondary | ICD-10-CM | POA: Diagnosis not present

## 2020-08-26 DIAGNOSIS — G620 Drug-induced polyneuropathy: Secondary | ICD-10-CM | POA: Diagnosis not present

## 2020-08-26 DIAGNOSIS — E785 Hyperlipidemia, unspecified: Secondary | ICD-10-CM | POA: Diagnosis not present

## 2020-08-26 DIAGNOSIS — I10 Essential (primary) hypertension: Secondary | ICD-10-CM | POA: Diagnosis not present

## 2020-08-26 DIAGNOSIS — I252 Old myocardial infarction: Secondary | ICD-10-CM | POA: Diagnosis not present

## 2020-08-26 DIAGNOSIS — C61 Malignant neoplasm of prostate: Secondary | ICD-10-CM | POA: Diagnosis not present

## 2020-08-26 DIAGNOSIS — I25119 Atherosclerotic heart disease of native coronary artery with unspecified angina pectoris: Secondary | ICD-10-CM | POA: Diagnosis not present

## 2020-08-26 DIAGNOSIS — R630 Anorexia: Secondary | ICD-10-CM | POA: Diagnosis not present

## 2020-08-27 DIAGNOSIS — G620 Drug-induced polyneuropathy: Secondary | ICD-10-CM | POA: Diagnosis not present

## 2020-08-27 DIAGNOSIS — I10 Essential (primary) hypertension: Secondary | ICD-10-CM | POA: Diagnosis not present

## 2020-08-27 DIAGNOSIS — R630 Anorexia: Secondary | ICD-10-CM | POA: Diagnosis not present

## 2020-08-27 DIAGNOSIS — C7951 Secondary malignant neoplasm of bone: Secondary | ICD-10-CM | POA: Diagnosis not present

## 2020-08-27 DIAGNOSIS — E785 Hyperlipidemia, unspecified: Secondary | ICD-10-CM | POA: Diagnosis not present

## 2020-08-27 DIAGNOSIS — I25119 Atherosclerotic heart disease of native coronary artery with unspecified angina pectoris: Secondary | ICD-10-CM | POA: Diagnosis not present

## 2020-08-27 DIAGNOSIS — I252 Old myocardial infarction: Secondary | ICD-10-CM | POA: Diagnosis not present

## 2020-08-27 DIAGNOSIS — C61 Malignant neoplasm of prostate: Secondary | ICD-10-CM | POA: Diagnosis not present

## 2020-08-27 DIAGNOSIS — R634 Abnormal weight loss: Secondary | ICD-10-CM | POA: Diagnosis not present

## 2020-08-28 DIAGNOSIS — I25119 Atherosclerotic heart disease of native coronary artery with unspecified angina pectoris: Secondary | ICD-10-CM | POA: Diagnosis not present

## 2020-08-28 DIAGNOSIS — G620 Drug-induced polyneuropathy: Secondary | ICD-10-CM | POA: Diagnosis not present

## 2020-08-28 DIAGNOSIS — C61 Malignant neoplasm of prostate: Secondary | ICD-10-CM | POA: Diagnosis not present

## 2020-08-28 DIAGNOSIS — I10 Essential (primary) hypertension: Secondary | ICD-10-CM | POA: Diagnosis not present

## 2020-08-28 DIAGNOSIS — R634 Abnormal weight loss: Secondary | ICD-10-CM | POA: Diagnosis not present

## 2020-08-28 DIAGNOSIS — R630 Anorexia: Secondary | ICD-10-CM | POA: Diagnosis not present

## 2020-08-28 DIAGNOSIS — E785 Hyperlipidemia, unspecified: Secondary | ICD-10-CM | POA: Diagnosis not present

## 2020-08-28 DIAGNOSIS — I252 Old myocardial infarction: Secondary | ICD-10-CM | POA: Diagnosis not present

## 2020-08-28 DIAGNOSIS — C7951 Secondary malignant neoplasm of bone: Secondary | ICD-10-CM | POA: Diagnosis not present

## 2020-08-29 DIAGNOSIS — I25119 Atherosclerotic heart disease of native coronary artery with unspecified angina pectoris: Secondary | ICD-10-CM | POA: Diagnosis not present

## 2020-08-29 DIAGNOSIS — I10 Essential (primary) hypertension: Secondary | ICD-10-CM | POA: Diagnosis not present

## 2020-08-29 DIAGNOSIS — C7951 Secondary malignant neoplasm of bone: Secondary | ICD-10-CM | POA: Diagnosis not present

## 2020-08-29 DIAGNOSIS — R630 Anorexia: Secondary | ICD-10-CM | POA: Diagnosis not present

## 2020-08-29 DIAGNOSIS — G620 Drug-induced polyneuropathy: Secondary | ICD-10-CM | POA: Diagnosis not present

## 2020-08-29 DIAGNOSIS — I252 Old myocardial infarction: Secondary | ICD-10-CM | POA: Diagnosis not present

## 2020-08-29 DIAGNOSIS — C61 Malignant neoplasm of prostate: Secondary | ICD-10-CM | POA: Diagnosis not present

## 2020-08-29 DIAGNOSIS — R634 Abnormal weight loss: Secondary | ICD-10-CM | POA: Diagnosis not present

## 2020-08-29 DIAGNOSIS — E785 Hyperlipidemia, unspecified: Secondary | ICD-10-CM | POA: Diagnosis not present

## 2020-08-30 DIAGNOSIS — R630 Anorexia: Secondary | ICD-10-CM | POA: Diagnosis not present

## 2020-08-30 DIAGNOSIS — R634 Abnormal weight loss: Secondary | ICD-10-CM | POA: Diagnosis not present

## 2020-08-30 DIAGNOSIS — E785 Hyperlipidemia, unspecified: Secondary | ICD-10-CM | POA: Diagnosis not present

## 2020-08-30 DIAGNOSIS — G620 Drug-induced polyneuropathy: Secondary | ICD-10-CM | POA: Diagnosis not present

## 2020-08-30 DIAGNOSIS — C7951 Secondary malignant neoplasm of bone: Secondary | ICD-10-CM | POA: Diagnosis not present

## 2020-08-30 DIAGNOSIS — I252 Old myocardial infarction: Secondary | ICD-10-CM | POA: Diagnosis not present

## 2020-08-30 DIAGNOSIS — I25119 Atherosclerotic heart disease of native coronary artery with unspecified angina pectoris: Secondary | ICD-10-CM | POA: Diagnosis not present

## 2020-08-30 DIAGNOSIS — C61 Malignant neoplasm of prostate: Secondary | ICD-10-CM | POA: Diagnosis not present

## 2020-08-30 DIAGNOSIS — I10 Essential (primary) hypertension: Secondary | ICD-10-CM | POA: Diagnosis not present

## 2020-08-31 DIAGNOSIS — R630 Anorexia: Secondary | ICD-10-CM | POA: Diagnosis not present

## 2020-08-31 DIAGNOSIS — E785 Hyperlipidemia, unspecified: Secondary | ICD-10-CM | POA: Diagnosis not present

## 2020-08-31 DIAGNOSIS — I25119 Atherosclerotic heart disease of native coronary artery with unspecified angina pectoris: Secondary | ICD-10-CM | POA: Diagnosis not present

## 2020-08-31 DIAGNOSIS — I10 Essential (primary) hypertension: Secondary | ICD-10-CM | POA: Diagnosis not present

## 2020-08-31 DIAGNOSIS — R634 Abnormal weight loss: Secondary | ICD-10-CM | POA: Diagnosis not present

## 2020-08-31 DIAGNOSIS — C61 Malignant neoplasm of prostate: Secondary | ICD-10-CM | POA: Diagnosis not present

## 2020-08-31 DIAGNOSIS — G620 Drug-induced polyneuropathy: Secondary | ICD-10-CM | POA: Diagnosis not present

## 2020-08-31 DIAGNOSIS — C7951 Secondary malignant neoplasm of bone: Secondary | ICD-10-CM | POA: Diagnosis not present

## 2020-08-31 DIAGNOSIS — I252 Old myocardial infarction: Secondary | ICD-10-CM | POA: Diagnosis not present

## 2020-09-01 DIAGNOSIS — I252 Old myocardial infarction: Secondary | ICD-10-CM | POA: Diagnosis not present

## 2020-09-01 DIAGNOSIS — R630 Anorexia: Secondary | ICD-10-CM | POA: Diagnosis not present

## 2020-09-01 DIAGNOSIS — I25119 Atherosclerotic heart disease of native coronary artery with unspecified angina pectoris: Secondary | ICD-10-CM | POA: Diagnosis not present

## 2020-09-01 DIAGNOSIS — I10 Essential (primary) hypertension: Secondary | ICD-10-CM | POA: Diagnosis not present

## 2020-09-01 DIAGNOSIS — C61 Malignant neoplasm of prostate: Secondary | ICD-10-CM | POA: Diagnosis not present

## 2020-09-01 DIAGNOSIS — G620 Drug-induced polyneuropathy: Secondary | ICD-10-CM | POA: Diagnosis not present

## 2020-09-01 DIAGNOSIS — R634 Abnormal weight loss: Secondary | ICD-10-CM | POA: Diagnosis not present

## 2020-09-01 DIAGNOSIS — C7951 Secondary malignant neoplasm of bone: Secondary | ICD-10-CM | POA: Diagnosis not present

## 2020-09-01 DIAGNOSIS — E785 Hyperlipidemia, unspecified: Secondary | ICD-10-CM | POA: Diagnosis not present

## 2020-09-02 ENCOUNTER — Other Ambulatory Visit: Payer: Self-pay | Admitting: Hospice and Palliative Medicine

## 2020-09-02 ENCOUNTER — Other Ambulatory Visit: Payer: Self-pay

## 2020-09-02 DIAGNOSIS — I10 Essential (primary) hypertension: Secondary | ICD-10-CM | POA: Diagnosis not present

## 2020-09-02 DIAGNOSIS — R634 Abnormal weight loss: Secondary | ICD-10-CM | POA: Diagnosis not present

## 2020-09-02 DIAGNOSIS — G620 Drug-induced polyneuropathy: Secondary | ICD-10-CM | POA: Diagnosis not present

## 2020-09-02 DIAGNOSIS — C61 Malignant neoplasm of prostate: Secondary | ICD-10-CM | POA: Diagnosis not present

## 2020-09-02 DIAGNOSIS — I252 Old myocardial infarction: Secondary | ICD-10-CM | POA: Diagnosis not present

## 2020-09-02 DIAGNOSIS — I25119 Atherosclerotic heart disease of native coronary artery with unspecified angina pectoris: Secondary | ICD-10-CM | POA: Diagnosis not present

## 2020-09-02 DIAGNOSIS — R630 Anorexia: Secondary | ICD-10-CM | POA: Diagnosis not present

## 2020-09-02 DIAGNOSIS — C7951 Secondary malignant neoplasm of bone: Secondary | ICD-10-CM | POA: Diagnosis not present

## 2020-09-02 DIAGNOSIS — E785 Hyperlipidemia, unspecified: Secondary | ICD-10-CM | POA: Diagnosis not present

## 2020-09-02 MED ORDER — FENTANYL 100 MCG/HR TD PT72
MEDICATED_PATCH | TRANSDERMAL | 0 refills | Status: DC
  Filled 2020-09-02: qty 10, 20d supply, fill #0

## 2020-09-02 MED ORDER — HALOPERIDOL 1 MG PO TABS
1.0000 mg | ORAL_TABLET | Freq: Four times a day (QID) | ORAL | 2 refills | Status: AC | PRN
  Filled 2020-09-02: qty 30, 8d supply, fill #0
  Filled 2020-11-02: qty 30, 8d supply, fill #1

## 2020-09-02 MED ORDER — FENTANYL 50 MCG/HR TD PT72
MEDICATED_PATCH | TRANSDERMAL | 0 refills | Status: DC
  Filled 2020-09-02: qty 10, 20d supply, fill #0

## 2020-09-02 MED ORDER — OXYCODONE HCL 15 MG PO TABS
15.0000 mg | ORAL_TABLET | ORAL | 0 refills | Status: DC | PRN
  Filled 2020-09-02: qty 90, 8d supply, fill #0

## 2020-09-03 ENCOUNTER — Other Ambulatory Visit: Payer: Self-pay

## 2020-09-03 DIAGNOSIS — I252 Old myocardial infarction: Secondary | ICD-10-CM | POA: Diagnosis not present

## 2020-09-03 DIAGNOSIS — G620 Drug-induced polyneuropathy: Secondary | ICD-10-CM | POA: Diagnosis not present

## 2020-09-03 DIAGNOSIS — R634 Abnormal weight loss: Secondary | ICD-10-CM | POA: Diagnosis not present

## 2020-09-03 DIAGNOSIS — C7951 Secondary malignant neoplasm of bone: Secondary | ICD-10-CM | POA: Diagnosis not present

## 2020-09-03 DIAGNOSIS — C61 Malignant neoplasm of prostate: Secondary | ICD-10-CM | POA: Diagnosis not present

## 2020-09-03 DIAGNOSIS — R630 Anorexia: Secondary | ICD-10-CM | POA: Diagnosis not present

## 2020-09-03 DIAGNOSIS — I25119 Atherosclerotic heart disease of native coronary artery with unspecified angina pectoris: Secondary | ICD-10-CM | POA: Diagnosis not present

## 2020-09-03 DIAGNOSIS — E785 Hyperlipidemia, unspecified: Secondary | ICD-10-CM | POA: Diagnosis not present

## 2020-09-03 DIAGNOSIS — I10 Essential (primary) hypertension: Secondary | ICD-10-CM | POA: Diagnosis not present

## 2020-09-04 DIAGNOSIS — C61 Malignant neoplasm of prostate: Secondary | ICD-10-CM | POA: Diagnosis not present

## 2020-09-04 DIAGNOSIS — G620 Drug-induced polyneuropathy: Secondary | ICD-10-CM | POA: Diagnosis not present

## 2020-09-04 DIAGNOSIS — I25119 Atherosclerotic heart disease of native coronary artery with unspecified angina pectoris: Secondary | ICD-10-CM | POA: Diagnosis not present

## 2020-09-04 DIAGNOSIS — R634 Abnormal weight loss: Secondary | ICD-10-CM | POA: Diagnosis not present

## 2020-09-04 DIAGNOSIS — E785 Hyperlipidemia, unspecified: Secondary | ICD-10-CM | POA: Diagnosis not present

## 2020-09-04 DIAGNOSIS — C7951 Secondary malignant neoplasm of bone: Secondary | ICD-10-CM | POA: Diagnosis not present

## 2020-09-04 DIAGNOSIS — I10 Essential (primary) hypertension: Secondary | ICD-10-CM | POA: Diagnosis not present

## 2020-09-04 DIAGNOSIS — I252 Old myocardial infarction: Secondary | ICD-10-CM | POA: Diagnosis not present

## 2020-09-04 DIAGNOSIS — R630 Anorexia: Secondary | ICD-10-CM | POA: Diagnosis not present

## 2020-09-05 DIAGNOSIS — G620 Drug-induced polyneuropathy: Secondary | ICD-10-CM | POA: Diagnosis not present

## 2020-09-05 DIAGNOSIS — E785 Hyperlipidemia, unspecified: Secondary | ICD-10-CM | POA: Diagnosis not present

## 2020-09-05 DIAGNOSIS — I252 Old myocardial infarction: Secondary | ICD-10-CM | POA: Diagnosis not present

## 2020-09-05 DIAGNOSIS — R630 Anorexia: Secondary | ICD-10-CM | POA: Diagnosis not present

## 2020-09-05 DIAGNOSIS — C61 Malignant neoplasm of prostate: Secondary | ICD-10-CM | POA: Diagnosis not present

## 2020-09-05 DIAGNOSIS — I25119 Atherosclerotic heart disease of native coronary artery with unspecified angina pectoris: Secondary | ICD-10-CM | POA: Diagnosis not present

## 2020-09-05 DIAGNOSIS — R634 Abnormal weight loss: Secondary | ICD-10-CM | POA: Diagnosis not present

## 2020-09-05 DIAGNOSIS — C7951 Secondary malignant neoplasm of bone: Secondary | ICD-10-CM | POA: Diagnosis not present

## 2020-09-05 DIAGNOSIS — I10 Essential (primary) hypertension: Secondary | ICD-10-CM | POA: Diagnosis not present

## 2020-09-06 DIAGNOSIS — I10 Essential (primary) hypertension: Secondary | ICD-10-CM | POA: Diagnosis not present

## 2020-09-06 DIAGNOSIS — G620 Drug-induced polyneuropathy: Secondary | ICD-10-CM | POA: Diagnosis not present

## 2020-09-06 DIAGNOSIS — I252 Old myocardial infarction: Secondary | ICD-10-CM | POA: Diagnosis not present

## 2020-09-06 DIAGNOSIS — C61 Malignant neoplasm of prostate: Secondary | ICD-10-CM | POA: Diagnosis not present

## 2020-09-06 DIAGNOSIS — I25119 Atherosclerotic heart disease of native coronary artery with unspecified angina pectoris: Secondary | ICD-10-CM | POA: Diagnosis not present

## 2020-09-06 DIAGNOSIS — R630 Anorexia: Secondary | ICD-10-CM | POA: Diagnosis not present

## 2020-09-06 DIAGNOSIS — C7951 Secondary malignant neoplasm of bone: Secondary | ICD-10-CM | POA: Diagnosis not present

## 2020-09-06 DIAGNOSIS — E785 Hyperlipidemia, unspecified: Secondary | ICD-10-CM | POA: Diagnosis not present

## 2020-09-06 DIAGNOSIS — R634 Abnormal weight loss: Secondary | ICD-10-CM | POA: Diagnosis not present

## 2020-09-07 DIAGNOSIS — R630 Anorexia: Secondary | ICD-10-CM | POA: Diagnosis not present

## 2020-09-07 DIAGNOSIS — G620 Drug-induced polyneuropathy: Secondary | ICD-10-CM | POA: Diagnosis not present

## 2020-09-07 DIAGNOSIS — R634 Abnormal weight loss: Secondary | ICD-10-CM | POA: Diagnosis not present

## 2020-09-07 DIAGNOSIS — I10 Essential (primary) hypertension: Secondary | ICD-10-CM | POA: Diagnosis not present

## 2020-09-07 DIAGNOSIS — C61 Malignant neoplasm of prostate: Secondary | ICD-10-CM | POA: Diagnosis not present

## 2020-09-07 DIAGNOSIS — E785 Hyperlipidemia, unspecified: Secondary | ICD-10-CM | POA: Diagnosis not present

## 2020-09-07 DIAGNOSIS — C7951 Secondary malignant neoplasm of bone: Secondary | ICD-10-CM | POA: Diagnosis not present

## 2020-09-07 DIAGNOSIS — I252 Old myocardial infarction: Secondary | ICD-10-CM | POA: Diagnosis not present

## 2020-09-07 DIAGNOSIS — I25119 Atherosclerotic heart disease of native coronary artery with unspecified angina pectoris: Secondary | ICD-10-CM | POA: Diagnosis not present

## 2020-09-08 DIAGNOSIS — G620 Drug-induced polyneuropathy: Secondary | ICD-10-CM | POA: Diagnosis not present

## 2020-09-08 DIAGNOSIS — C7951 Secondary malignant neoplasm of bone: Secondary | ICD-10-CM | POA: Diagnosis not present

## 2020-09-08 DIAGNOSIS — I252 Old myocardial infarction: Secondary | ICD-10-CM | POA: Diagnosis not present

## 2020-09-08 DIAGNOSIS — I25119 Atherosclerotic heart disease of native coronary artery with unspecified angina pectoris: Secondary | ICD-10-CM | POA: Diagnosis not present

## 2020-09-08 DIAGNOSIS — R634 Abnormal weight loss: Secondary | ICD-10-CM | POA: Diagnosis not present

## 2020-09-08 DIAGNOSIS — E785 Hyperlipidemia, unspecified: Secondary | ICD-10-CM | POA: Diagnosis not present

## 2020-09-08 DIAGNOSIS — C61 Malignant neoplasm of prostate: Secondary | ICD-10-CM | POA: Diagnosis not present

## 2020-09-08 DIAGNOSIS — I10 Essential (primary) hypertension: Secondary | ICD-10-CM | POA: Diagnosis not present

## 2020-09-08 DIAGNOSIS — R630 Anorexia: Secondary | ICD-10-CM | POA: Diagnosis not present

## 2020-09-09 ENCOUNTER — Other Ambulatory Visit: Payer: Self-pay | Admitting: *Deleted

## 2020-09-09 ENCOUNTER — Other Ambulatory Visit: Payer: Self-pay

## 2020-09-09 DIAGNOSIS — C61 Malignant neoplasm of prostate: Secondary | ICD-10-CM | POA: Diagnosis not present

## 2020-09-09 DIAGNOSIS — I252 Old myocardial infarction: Secondary | ICD-10-CM | POA: Diagnosis not present

## 2020-09-09 DIAGNOSIS — E785 Hyperlipidemia, unspecified: Secondary | ICD-10-CM | POA: Diagnosis not present

## 2020-09-09 DIAGNOSIS — G620 Drug-induced polyneuropathy: Secondary | ICD-10-CM | POA: Diagnosis not present

## 2020-09-09 DIAGNOSIS — I10 Essential (primary) hypertension: Secondary | ICD-10-CM

## 2020-09-09 DIAGNOSIS — C7951 Secondary malignant neoplasm of bone: Secondary | ICD-10-CM | POA: Diagnosis not present

## 2020-09-09 DIAGNOSIS — R630 Anorexia: Secondary | ICD-10-CM | POA: Diagnosis not present

## 2020-09-09 DIAGNOSIS — I25119 Atherosclerotic heart disease of native coronary artery with unspecified angina pectoris: Secondary | ICD-10-CM | POA: Diagnosis not present

## 2020-09-09 DIAGNOSIS — R634 Abnormal weight loss: Secondary | ICD-10-CM | POA: Diagnosis not present

## 2020-09-09 MED ORDER — METOPROLOL TARTRATE 25 MG PO TABS
ORAL_TABLET | Freq: Two times a day (BID) | ORAL | 1 refills | Status: AC
Start: 2020-09-09 — End: 2021-09-09
  Filled 2020-09-09 – 2020-09-13 (×3): qty 60, 30d supply, fill #0
  Filled 2020-11-06: qty 60, 30d supply, fill #1

## 2020-09-10 ENCOUNTER — Other Ambulatory Visit: Payer: Self-pay

## 2020-09-10 DIAGNOSIS — I10 Essential (primary) hypertension: Secondary | ICD-10-CM | POA: Diagnosis not present

## 2020-09-10 DIAGNOSIS — I252 Old myocardial infarction: Secondary | ICD-10-CM | POA: Diagnosis not present

## 2020-09-10 DIAGNOSIS — E785 Hyperlipidemia, unspecified: Secondary | ICD-10-CM | POA: Diagnosis not present

## 2020-09-10 DIAGNOSIS — C61 Malignant neoplasm of prostate: Secondary | ICD-10-CM | POA: Diagnosis not present

## 2020-09-10 DIAGNOSIS — I25119 Atherosclerotic heart disease of native coronary artery with unspecified angina pectoris: Secondary | ICD-10-CM | POA: Diagnosis not present

## 2020-09-10 DIAGNOSIS — C7951 Secondary malignant neoplasm of bone: Secondary | ICD-10-CM | POA: Diagnosis not present

## 2020-09-10 DIAGNOSIS — G620 Drug-induced polyneuropathy: Secondary | ICD-10-CM | POA: Diagnosis not present

## 2020-09-10 DIAGNOSIS — R634 Abnormal weight loss: Secondary | ICD-10-CM | POA: Diagnosis not present

## 2020-09-10 DIAGNOSIS — R630 Anorexia: Secondary | ICD-10-CM | POA: Diagnosis not present

## 2020-09-11 DIAGNOSIS — C61 Malignant neoplasm of prostate: Secondary | ICD-10-CM | POA: Diagnosis not present

## 2020-09-11 DIAGNOSIS — C7951 Secondary malignant neoplasm of bone: Secondary | ICD-10-CM | POA: Diagnosis not present

## 2020-09-11 DIAGNOSIS — G620 Drug-induced polyneuropathy: Secondary | ICD-10-CM | POA: Diagnosis not present

## 2020-09-11 DIAGNOSIS — R634 Abnormal weight loss: Secondary | ICD-10-CM | POA: Diagnosis not present

## 2020-09-11 DIAGNOSIS — I25119 Atherosclerotic heart disease of native coronary artery with unspecified angina pectoris: Secondary | ICD-10-CM | POA: Diagnosis not present

## 2020-09-11 DIAGNOSIS — I10 Essential (primary) hypertension: Secondary | ICD-10-CM | POA: Diagnosis not present

## 2020-09-11 DIAGNOSIS — E785 Hyperlipidemia, unspecified: Secondary | ICD-10-CM | POA: Diagnosis not present

## 2020-09-11 DIAGNOSIS — I252 Old myocardial infarction: Secondary | ICD-10-CM | POA: Diagnosis not present

## 2020-09-11 DIAGNOSIS — R630 Anorexia: Secondary | ICD-10-CM | POA: Diagnosis not present

## 2020-09-12 DIAGNOSIS — C61 Malignant neoplasm of prostate: Secondary | ICD-10-CM | POA: Diagnosis not present

## 2020-09-12 DIAGNOSIS — G620 Drug-induced polyneuropathy: Secondary | ICD-10-CM | POA: Diagnosis not present

## 2020-09-12 DIAGNOSIS — R630 Anorexia: Secondary | ICD-10-CM | POA: Diagnosis not present

## 2020-09-12 DIAGNOSIS — E785 Hyperlipidemia, unspecified: Secondary | ICD-10-CM | POA: Diagnosis not present

## 2020-09-12 DIAGNOSIS — R634 Abnormal weight loss: Secondary | ICD-10-CM | POA: Diagnosis not present

## 2020-09-12 DIAGNOSIS — I25119 Atherosclerotic heart disease of native coronary artery with unspecified angina pectoris: Secondary | ICD-10-CM | POA: Diagnosis not present

## 2020-09-12 DIAGNOSIS — I10 Essential (primary) hypertension: Secondary | ICD-10-CM | POA: Diagnosis not present

## 2020-09-12 DIAGNOSIS — I252 Old myocardial infarction: Secondary | ICD-10-CM | POA: Diagnosis not present

## 2020-09-12 DIAGNOSIS — C7951 Secondary malignant neoplasm of bone: Secondary | ICD-10-CM | POA: Diagnosis not present

## 2020-09-13 ENCOUNTER — Other Ambulatory Visit: Payer: Self-pay

## 2020-09-13 DIAGNOSIS — C61 Malignant neoplasm of prostate: Secondary | ICD-10-CM | POA: Diagnosis not present

## 2020-09-13 DIAGNOSIS — C7951 Secondary malignant neoplasm of bone: Secondary | ICD-10-CM | POA: Diagnosis not present

## 2020-09-13 DIAGNOSIS — G620 Drug-induced polyneuropathy: Secondary | ICD-10-CM | POA: Diagnosis not present

## 2020-09-13 DIAGNOSIS — E785 Hyperlipidemia, unspecified: Secondary | ICD-10-CM | POA: Diagnosis not present

## 2020-09-13 DIAGNOSIS — R630 Anorexia: Secondary | ICD-10-CM | POA: Diagnosis not present

## 2020-09-13 DIAGNOSIS — I25119 Atherosclerotic heart disease of native coronary artery with unspecified angina pectoris: Secondary | ICD-10-CM | POA: Diagnosis not present

## 2020-09-13 DIAGNOSIS — I10 Essential (primary) hypertension: Secondary | ICD-10-CM | POA: Diagnosis not present

## 2020-09-13 DIAGNOSIS — R634 Abnormal weight loss: Secondary | ICD-10-CM | POA: Diagnosis not present

## 2020-09-13 DIAGNOSIS — I252 Old myocardial infarction: Secondary | ICD-10-CM | POA: Diagnosis not present

## 2020-09-14 ENCOUNTER — Encounter: Payer: Self-pay | Admitting: *Deleted

## 2020-09-14 ENCOUNTER — Encounter: Payer: Self-pay | Admitting: Internal Medicine

## 2020-09-14 DIAGNOSIS — E785 Hyperlipidemia, unspecified: Secondary | ICD-10-CM | POA: Diagnosis not present

## 2020-09-14 DIAGNOSIS — G620 Drug-induced polyneuropathy: Secondary | ICD-10-CM | POA: Diagnosis not present

## 2020-09-14 DIAGNOSIS — R634 Abnormal weight loss: Secondary | ICD-10-CM | POA: Diagnosis not present

## 2020-09-14 DIAGNOSIS — I25119 Atherosclerotic heart disease of native coronary artery with unspecified angina pectoris: Secondary | ICD-10-CM | POA: Diagnosis not present

## 2020-09-14 DIAGNOSIS — C7951 Secondary malignant neoplasm of bone: Secondary | ICD-10-CM | POA: Diagnosis not present

## 2020-09-14 DIAGNOSIS — C61 Malignant neoplasm of prostate: Secondary | ICD-10-CM | POA: Diagnosis not present

## 2020-09-14 DIAGNOSIS — R630 Anorexia: Secondary | ICD-10-CM | POA: Diagnosis not present

## 2020-09-14 DIAGNOSIS — I252 Old myocardial infarction: Secondary | ICD-10-CM | POA: Diagnosis not present

## 2020-09-14 DIAGNOSIS — I10 Essential (primary) hypertension: Secondary | ICD-10-CM | POA: Diagnosis not present

## 2020-09-15 DIAGNOSIS — G620 Drug-induced polyneuropathy: Secondary | ICD-10-CM | POA: Diagnosis not present

## 2020-09-15 DIAGNOSIS — I10 Essential (primary) hypertension: Secondary | ICD-10-CM | POA: Diagnosis not present

## 2020-09-15 DIAGNOSIS — R634 Abnormal weight loss: Secondary | ICD-10-CM | POA: Diagnosis not present

## 2020-09-15 DIAGNOSIS — R630 Anorexia: Secondary | ICD-10-CM | POA: Diagnosis not present

## 2020-09-15 DIAGNOSIS — I25119 Atherosclerotic heart disease of native coronary artery with unspecified angina pectoris: Secondary | ICD-10-CM | POA: Diagnosis not present

## 2020-09-15 DIAGNOSIS — E785 Hyperlipidemia, unspecified: Secondary | ICD-10-CM | POA: Diagnosis not present

## 2020-09-15 DIAGNOSIS — C61 Malignant neoplasm of prostate: Secondary | ICD-10-CM | POA: Diagnosis not present

## 2020-09-15 DIAGNOSIS — C7951 Secondary malignant neoplasm of bone: Secondary | ICD-10-CM | POA: Diagnosis not present

## 2020-09-15 DIAGNOSIS — I252 Old myocardial infarction: Secondary | ICD-10-CM | POA: Diagnosis not present

## 2020-09-16 DIAGNOSIS — R634 Abnormal weight loss: Secondary | ICD-10-CM | POA: Diagnosis not present

## 2020-09-16 DIAGNOSIS — R630 Anorexia: Secondary | ICD-10-CM | POA: Diagnosis not present

## 2020-09-16 DIAGNOSIS — I10 Essential (primary) hypertension: Secondary | ICD-10-CM | POA: Diagnosis not present

## 2020-09-16 DIAGNOSIS — I252 Old myocardial infarction: Secondary | ICD-10-CM | POA: Diagnosis not present

## 2020-09-16 DIAGNOSIS — I25119 Atherosclerotic heart disease of native coronary artery with unspecified angina pectoris: Secondary | ICD-10-CM | POA: Diagnosis not present

## 2020-09-16 DIAGNOSIS — E785 Hyperlipidemia, unspecified: Secondary | ICD-10-CM | POA: Diagnosis not present

## 2020-09-16 DIAGNOSIS — C61 Malignant neoplasm of prostate: Secondary | ICD-10-CM | POA: Diagnosis not present

## 2020-09-16 DIAGNOSIS — G620 Drug-induced polyneuropathy: Secondary | ICD-10-CM | POA: Diagnosis not present

## 2020-09-16 DIAGNOSIS — C7951 Secondary malignant neoplasm of bone: Secondary | ICD-10-CM | POA: Diagnosis not present

## 2020-09-17 DIAGNOSIS — I252 Old myocardial infarction: Secondary | ICD-10-CM | POA: Diagnosis not present

## 2020-09-17 DIAGNOSIS — E785 Hyperlipidemia, unspecified: Secondary | ICD-10-CM | POA: Diagnosis not present

## 2020-09-17 DIAGNOSIS — I25119 Atherosclerotic heart disease of native coronary artery with unspecified angina pectoris: Secondary | ICD-10-CM | POA: Diagnosis not present

## 2020-09-17 DIAGNOSIS — I10 Essential (primary) hypertension: Secondary | ICD-10-CM | POA: Diagnosis not present

## 2020-09-17 DIAGNOSIS — G620 Drug-induced polyneuropathy: Secondary | ICD-10-CM | POA: Diagnosis not present

## 2020-09-17 DIAGNOSIS — C7951 Secondary malignant neoplasm of bone: Secondary | ICD-10-CM | POA: Diagnosis not present

## 2020-09-17 DIAGNOSIS — C61 Malignant neoplasm of prostate: Secondary | ICD-10-CM | POA: Diagnosis not present

## 2020-09-17 DIAGNOSIS — R630 Anorexia: Secondary | ICD-10-CM | POA: Diagnosis not present

## 2020-09-17 DIAGNOSIS — R634 Abnormal weight loss: Secondary | ICD-10-CM | POA: Diagnosis not present

## 2020-09-18 DIAGNOSIS — R634 Abnormal weight loss: Secondary | ICD-10-CM | POA: Diagnosis not present

## 2020-09-18 DIAGNOSIS — G620 Drug-induced polyneuropathy: Secondary | ICD-10-CM | POA: Diagnosis not present

## 2020-09-18 DIAGNOSIS — E785 Hyperlipidemia, unspecified: Secondary | ICD-10-CM | POA: Diagnosis not present

## 2020-09-18 DIAGNOSIS — I252 Old myocardial infarction: Secondary | ICD-10-CM | POA: Diagnosis not present

## 2020-09-18 DIAGNOSIS — I25119 Atherosclerotic heart disease of native coronary artery with unspecified angina pectoris: Secondary | ICD-10-CM | POA: Diagnosis not present

## 2020-09-18 DIAGNOSIS — R630 Anorexia: Secondary | ICD-10-CM | POA: Diagnosis not present

## 2020-09-18 DIAGNOSIS — I10 Essential (primary) hypertension: Secondary | ICD-10-CM | POA: Diagnosis not present

## 2020-09-18 DIAGNOSIS — C7951 Secondary malignant neoplasm of bone: Secondary | ICD-10-CM | POA: Diagnosis not present

## 2020-09-18 DIAGNOSIS — C61 Malignant neoplasm of prostate: Secondary | ICD-10-CM | POA: Diagnosis not present

## 2020-09-19 DIAGNOSIS — R634 Abnormal weight loss: Secondary | ICD-10-CM | POA: Diagnosis not present

## 2020-09-19 DIAGNOSIS — C7951 Secondary malignant neoplasm of bone: Secondary | ICD-10-CM | POA: Diagnosis not present

## 2020-09-19 DIAGNOSIS — I25119 Atherosclerotic heart disease of native coronary artery with unspecified angina pectoris: Secondary | ICD-10-CM | POA: Diagnosis not present

## 2020-09-19 DIAGNOSIS — I10 Essential (primary) hypertension: Secondary | ICD-10-CM | POA: Diagnosis not present

## 2020-09-19 DIAGNOSIS — E785 Hyperlipidemia, unspecified: Secondary | ICD-10-CM | POA: Diagnosis not present

## 2020-09-19 DIAGNOSIS — C61 Malignant neoplasm of prostate: Secondary | ICD-10-CM | POA: Diagnosis not present

## 2020-09-19 DIAGNOSIS — R630 Anorexia: Secondary | ICD-10-CM | POA: Diagnosis not present

## 2020-09-19 DIAGNOSIS — G620 Drug-induced polyneuropathy: Secondary | ICD-10-CM | POA: Diagnosis not present

## 2020-09-19 DIAGNOSIS — I252 Old myocardial infarction: Secondary | ICD-10-CM | POA: Diagnosis not present

## 2020-09-20 ENCOUNTER — Telehealth: Payer: Self-pay | Admitting: Hospice and Palliative Medicine

## 2020-09-20 ENCOUNTER — Other Ambulatory Visit: Payer: Self-pay

## 2020-09-20 DIAGNOSIS — I252 Old myocardial infarction: Secondary | ICD-10-CM | POA: Diagnosis not present

## 2020-09-20 DIAGNOSIS — R634 Abnormal weight loss: Secondary | ICD-10-CM | POA: Diagnosis not present

## 2020-09-20 DIAGNOSIS — C7951 Secondary malignant neoplasm of bone: Secondary | ICD-10-CM | POA: Diagnosis not present

## 2020-09-20 DIAGNOSIS — I10 Essential (primary) hypertension: Secondary | ICD-10-CM | POA: Diagnosis not present

## 2020-09-20 DIAGNOSIS — G620 Drug-induced polyneuropathy: Secondary | ICD-10-CM | POA: Diagnosis not present

## 2020-09-20 DIAGNOSIS — R630 Anorexia: Secondary | ICD-10-CM | POA: Diagnosis not present

## 2020-09-20 DIAGNOSIS — C61 Malignant neoplasm of prostate: Secondary | ICD-10-CM | POA: Diagnosis not present

## 2020-09-20 DIAGNOSIS — E785 Hyperlipidemia, unspecified: Secondary | ICD-10-CM | POA: Diagnosis not present

## 2020-09-20 DIAGNOSIS — I25119 Atherosclerotic heart disease of native coronary artery with unspecified angina pectoris: Secondary | ICD-10-CM | POA: Diagnosis not present

## 2020-09-20 MED ORDER — MORPHINE SULFATE (CONCENTRATE) 10 MG /0.5 ML PO SOLN
10.0000 mg | ORAL | 0 refills | Status: DC | PRN
  Filled 2020-09-20: qty 30, 5d supply, fill #0

## 2020-09-20 NOTE — Telephone Encounter (Signed)
Received a call from patient's hospice nurse.  Patient apparently was started on morphine elixir by hospice provider and has found that to be more effective than oxycodone in controlling pain/dyspnea.  We will refill morphine elixir and discontinue oxycodone.

## 2020-09-21 DIAGNOSIS — G620 Drug-induced polyneuropathy: Secondary | ICD-10-CM | POA: Diagnosis not present

## 2020-09-21 DIAGNOSIS — R630 Anorexia: Secondary | ICD-10-CM | POA: Diagnosis not present

## 2020-09-21 DIAGNOSIS — E785 Hyperlipidemia, unspecified: Secondary | ICD-10-CM | POA: Diagnosis not present

## 2020-09-21 DIAGNOSIS — I25119 Atherosclerotic heart disease of native coronary artery with unspecified angina pectoris: Secondary | ICD-10-CM | POA: Diagnosis not present

## 2020-09-21 DIAGNOSIS — R634 Abnormal weight loss: Secondary | ICD-10-CM | POA: Diagnosis not present

## 2020-09-21 DIAGNOSIS — I10 Essential (primary) hypertension: Secondary | ICD-10-CM | POA: Diagnosis not present

## 2020-09-21 DIAGNOSIS — I252 Old myocardial infarction: Secondary | ICD-10-CM | POA: Diagnosis not present

## 2020-09-21 DIAGNOSIS — C61 Malignant neoplasm of prostate: Secondary | ICD-10-CM | POA: Diagnosis not present

## 2020-09-21 DIAGNOSIS — C7951 Secondary malignant neoplasm of bone: Secondary | ICD-10-CM | POA: Diagnosis not present

## 2020-09-22 DIAGNOSIS — I10 Essential (primary) hypertension: Secondary | ICD-10-CM | POA: Diagnosis not present

## 2020-09-22 DIAGNOSIS — I252 Old myocardial infarction: Secondary | ICD-10-CM | POA: Diagnosis not present

## 2020-09-22 DIAGNOSIS — R630 Anorexia: Secondary | ICD-10-CM | POA: Diagnosis not present

## 2020-09-22 DIAGNOSIS — I25119 Atherosclerotic heart disease of native coronary artery with unspecified angina pectoris: Secondary | ICD-10-CM | POA: Diagnosis not present

## 2020-09-22 DIAGNOSIS — C61 Malignant neoplasm of prostate: Secondary | ICD-10-CM | POA: Diagnosis not present

## 2020-09-22 DIAGNOSIS — G620 Drug-induced polyneuropathy: Secondary | ICD-10-CM | POA: Diagnosis not present

## 2020-09-22 DIAGNOSIS — C7951 Secondary malignant neoplasm of bone: Secondary | ICD-10-CM | POA: Diagnosis not present

## 2020-09-22 DIAGNOSIS — R634 Abnormal weight loss: Secondary | ICD-10-CM | POA: Diagnosis not present

## 2020-09-22 DIAGNOSIS — E785 Hyperlipidemia, unspecified: Secondary | ICD-10-CM | POA: Diagnosis not present

## 2020-09-23 ENCOUNTER — Telehealth: Payer: Self-pay | Admitting: Hospice and Palliative Medicine

## 2020-09-23 DIAGNOSIS — C7951 Secondary malignant neoplasm of bone: Secondary | ICD-10-CM | POA: Diagnosis not present

## 2020-09-23 DIAGNOSIS — R630 Anorexia: Secondary | ICD-10-CM | POA: Diagnosis not present

## 2020-09-23 DIAGNOSIS — C61 Malignant neoplasm of prostate: Secondary | ICD-10-CM | POA: Diagnosis not present

## 2020-09-23 DIAGNOSIS — I10 Essential (primary) hypertension: Secondary | ICD-10-CM | POA: Diagnosis not present

## 2020-09-23 DIAGNOSIS — R634 Abnormal weight loss: Secondary | ICD-10-CM | POA: Diagnosis not present

## 2020-09-23 DIAGNOSIS — I25119 Atherosclerotic heart disease of native coronary artery with unspecified angina pectoris: Secondary | ICD-10-CM | POA: Diagnosis not present

## 2020-09-23 DIAGNOSIS — G620 Drug-induced polyneuropathy: Secondary | ICD-10-CM | POA: Diagnosis not present

## 2020-09-23 DIAGNOSIS — I252 Old myocardial infarction: Secondary | ICD-10-CM | POA: Diagnosis not present

## 2020-09-23 DIAGNOSIS — E785 Hyperlipidemia, unspecified: Secondary | ICD-10-CM | POA: Diagnosis not present

## 2020-09-23 NOTE — Telephone Encounter (Signed)
I spoke with patient's hospice nurse, Amy.  She reports that pain is much improved on morphine after rotating from oxycodone.  No other significant changes or concerns reported.

## 2020-09-24 DIAGNOSIS — I25119 Atherosclerotic heart disease of native coronary artery with unspecified angina pectoris: Secondary | ICD-10-CM | POA: Diagnosis not present

## 2020-09-24 DIAGNOSIS — R634 Abnormal weight loss: Secondary | ICD-10-CM | POA: Diagnosis not present

## 2020-09-24 DIAGNOSIS — C61 Malignant neoplasm of prostate: Secondary | ICD-10-CM | POA: Diagnosis not present

## 2020-09-24 DIAGNOSIS — C7951 Secondary malignant neoplasm of bone: Secondary | ICD-10-CM | POA: Diagnosis not present

## 2020-09-24 DIAGNOSIS — I10 Essential (primary) hypertension: Secondary | ICD-10-CM | POA: Diagnosis not present

## 2020-09-24 DIAGNOSIS — R630 Anorexia: Secondary | ICD-10-CM | POA: Diagnosis not present

## 2020-09-24 DIAGNOSIS — I252 Old myocardial infarction: Secondary | ICD-10-CM | POA: Diagnosis not present

## 2020-09-24 DIAGNOSIS — E785 Hyperlipidemia, unspecified: Secondary | ICD-10-CM | POA: Diagnosis not present

## 2020-09-24 DIAGNOSIS — G620 Drug-induced polyneuropathy: Secondary | ICD-10-CM | POA: Diagnosis not present

## 2020-09-25 DIAGNOSIS — R630 Anorexia: Secondary | ICD-10-CM | POA: Diagnosis not present

## 2020-09-25 DIAGNOSIS — I25119 Atherosclerotic heart disease of native coronary artery with unspecified angina pectoris: Secondary | ICD-10-CM | POA: Diagnosis not present

## 2020-09-25 DIAGNOSIS — I10 Essential (primary) hypertension: Secondary | ICD-10-CM | POA: Diagnosis not present

## 2020-09-25 DIAGNOSIS — I252 Old myocardial infarction: Secondary | ICD-10-CM | POA: Diagnosis not present

## 2020-09-25 DIAGNOSIS — C61 Malignant neoplasm of prostate: Secondary | ICD-10-CM | POA: Diagnosis not present

## 2020-09-25 DIAGNOSIS — G620 Drug-induced polyneuropathy: Secondary | ICD-10-CM | POA: Diagnosis not present

## 2020-09-25 DIAGNOSIS — C7951 Secondary malignant neoplasm of bone: Secondary | ICD-10-CM | POA: Diagnosis not present

## 2020-09-25 DIAGNOSIS — R634 Abnormal weight loss: Secondary | ICD-10-CM | POA: Diagnosis not present

## 2020-09-25 DIAGNOSIS — E785 Hyperlipidemia, unspecified: Secondary | ICD-10-CM | POA: Diagnosis not present

## 2020-09-26 DIAGNOSIS — G620 Drug-induced polyneuropathy: Secondary | ICD-10-CM | POA: Diagnosis not present

## 2020-09-26 DIAGNOSIS — R630 Anorexia: Secondary | ICD-10-CM | POA: Diagnosis not present

## 2020-09-26 DIAGNOSIS — E785 Hyperlipidemia, unspecified: Secondary | ICD-10-CM | POA: Diagnosis not present

## 2020-09-26 DIAGNOSIS — C7951 Secondary malignant neoplasm of bone: Secondary | ICD-10-CM | POA: Diagnosis not present

## 2020-09-26 DIAGNOSIS — I252 Old myocardial infarction: Secondary | ICD-10-CM | POA: Diagnosis not present

## 2020-09-26 DIAGNOSIS — C61 Malignant neoplasm of prostate: Secondary | ICD-10-CM | POA: Diagnosis not present

## 2020-09-26 DIAGNOSIS — I25119 Atherosclerotic heart disease of native coronary artery with unspecified angina pectoris: Secondary | ICD-10-CM | POA: Diagnosis not present

## 2020-09-26 DIAGNOSIS — R634 Abnormal weight loss: Secondary | ICD-10-CM | POA: Diagnosis not present

## 2020-09-26 DIAGNOSIS — I10 Essential (primary) hypertension: Secondary | ICD-10-CM | POA: Diagnosis not present

## 2020-09-27 DIAGNOSIS — C7951 Secondary malignant neoplasm of bone: Secondary | ICD-10-CM | POA: Diagnosis not present

## 2020-09-27 DIAGNOSIS — C61 Malignant neoplasm of prostate: Secondary | ICD-10-CM | POA: Diagnosis not present

## 2020-09-27 DIAGNOSIS — I25119 Atherosclerotic heart disease of native coronary artery with unspecified angina pectoris: Secondary | ICD-10-CM | POA: Diagnosis not present

## 2020-09-27 DIAGNOSIS — R630 Anorexia: Secondary | ICD-10-CM | POA: Diagnosis not present

## 2020-09-27 DIAGNOSIS — I252 Old myocardial infarction: Secondary | ICD-10-CM | POA: Diagnosis not present

## 2020-09-27 DIAGNOSIS — E785 Hyperlipidemia, unspecified: Secondary | ICD-10-CM | POA: Diagnosis not present

## 2020-09-27 DIAGNOSIS — G620 Drug-induced polyneuropathy: Secondary | ICD-10-CM | POA: Diagnosis not present

## 2020-09-27 DIAGNOSIS — I10 Essential (primary) hypertension: Secondary | ICD-10-CM | POA: Diagnosis not present

## 2020-09-27 DIAGNOSIS — R634 Abnormal weight loss: Secondary | ICD-10-CM | POA: Diagnosis not present

## 2020-09-28 ENCOUNTER — Other Ambulatory Visit: Payer: Self-pay

## 2020-09-28 ENCOUNTER — Other Ambulatory Visit: Payer: Self-pay | Admitting: *Deleted

## 2020-09-28 DIAGNOSIS — I252 Old myocardial infarction: Secondary | ICD-10-CM | POA: Diagnosis not present

## 2020-09-28 DIAGNOSIS — G620 Drug-induced polyneuropathy: Secondary | ICD-10-CM | POA: Diagnosis not present

## 2020-09-28 DIAGNOSIS — I25119 Atherosclerotic heart disease of native coronary artery with unspecified angina pectoris: Secondary | ICD-10-CM | POA: Diagnosis not present

## 2020-09-28 DIAGNOSIS — I10 Essential (primary) hypertension: Secondary | ICD-10-CM | POA: Diagnosis not present

## 2020-09-28 DIAGNOSIS — C61 Malignant neoplasm of prostate: Secondary | ICD-10-CM | POA: Diagnosis not present

## 2020-09-28 DIAGNOSIS — R630 Anorexia: Secondary | ICD-10-CM | POA: Diagnosis not present

## 2020-09-28 DIAGNOSIS — E785 Hyperlipidemia, unspecified: Secondary | ICD-10-CM | POA: Diagnosis not present

## 2020-09-28 DIAGNOSIS — R634 Abnormal weight loss: Secondary | ICD-10-CM | POA: Diagnosis not present

## 2020-09-28 DIAGNOSIS — C7951 Secondary malignant neoplasm of bone: Secondary | ICD-10-CM | POA: Diagnosis not present

## 2020-09-28 MED ORDER — FLUCONAZOLE 100 MG PO TABS
ORAL_TABLET | ORAL | 0 refills | Status: DC
  Filled 2020-09-28: qty 7, 7d supply, fill #0

## 2020-09-28 NOTE — Telephone Encounter (Signed)
Dr. B- pls send scripts.

## 2020-09-29 ENCOUNTER — Other Ambulatory Visit: Payer: Self-pay | Admitting: Hospice and Palliative Medicine

## 2020-09-29 ENCOUNTER — Encounter: Payer: Self-pay | Admitting: Internal Medicine

## 2020-09-29 ENCOUNTER — Other Ambulatory Visit: Payer: Self-pay | Admitting: *Deleted

## 2020-09-29 ENCOUNTER — Other Ambulatory Visit: Payer: Self-pay

## 2020-09-29 DIAGNOSIS — R630 Anorexia: Secondary | ICD-10-CM | POA: Diagnosis not present

## 2020-09-29 DIAGNOSIS — I252 Old myocardial infarction: Secondary | ICD-10-CM | POA: Diagnosis not present

## 2020-09-29 DIAGNOSIS — C61 Malignant neoplasm of prostate: Secondary | ICD-10-CM | POA: Diagnosis not present

## 2020-09-29 DIAGNOSIS — E785 Hyperlipidemia, unspecified: Secondary | ICD-10-CM | POA: Diagnosis not present

## 2020-09-29 DIAGNOSIS — C7951 Secondary malignant neoplasm of bone: Secondary | ICD-10-CM | POA: Diagnosis not present

## 2020-09-29 DIAGNOSIS — I25119 Atherosclerotic heart disease of native coronary artery with unspecified angina pectoris: Secondary | ICD-10-CM | POA: Diagnosis not present

## 2020-09-29 DIAGNOSIS — I10 Essential (primary) hypertension: Secondary | ICD-10-CM | POA: Diagnosis not present

## 2020-09-29 DIAGNOSIS — R634 Abnormal weight loss: Secondary | ICD-10-CM | POA: Diagnosis not present

## 2020-09-29 DIAGNOSIS — G620 Drug-induced polyneuropathy: Secondary | ICD-10-CM | POA: Diagnosis not present

## 2020-09-29 MED ORDER — FENTANYL 50 MCG/HR TD PT72
MEDICATED_PATCH | TRANSDERMAL | 0 refills | Status: DC
  Filled 2020-09-29: qty 10, 20d supply, fill #0

## 2020-09-29 MED ORDER — PROMETHAZINE HCL 25 MG PO TABS
ORAL_TABLET | ORAL | 3 refills | Status: AC
  Filled 2020-09-29: qty 60, 20d supply, fill #0

## 2020-09-29 MED ORDER — FENTANYL 100 MCG/HR TD PT72
MEDICATED_PATCH | TRANSDERMAL | 0 refills | Status: DC
  Filled 2020-09-29: qty 10, 20d supply, fill #0

## 2020-09-30 DIAGNOSIS — I10 Essential (primary) hypertension: Secondary | ICD-10-CM | POA: Diagnosis not present

## 2020-09-30 DIAGNOSIS — R630 Anorexia: Secondary | ICD-10-CM | POA: Diagnosis not present

## 2020-09-30 DIAGNOSIS — I25119 Atherosclerotic heart disease of native coronary artery with unspecified angina pectoris: Secondary | ICD-10-CM | POA: Diagnosis not present

## 2020-09-30 DIAGNOSIS — E785 Hyperlipidemia, unspecified: Secondary | ICD-10-CM | POA: Diagnosis not present

## 2020-09-30 DIAGNOSIS — C61 Malignant neoplasm of prostate: Secondary | ICD-10-CM | POA: Diagnosis not present

## 2020-09-30 DIAGNOSIS — R634 Abnormal weight loss: Secondary | ICD-10-CM | POA: Diagnosis not present

## 2020-09-30 DIAGNOSIS — I252 Old myocardial infarction: Secondary | ICD-10-CM | POA: Diagnosis not present

## 2020-09-30 DIAGNOSIS — C7951 Secondary malignant neoplasm of bone: Secondary | ICD-10-CM | POA: Diagnosis not present

## 2020-09-30 DIAGNOSIS — G620 Drug-induced polyneuropathy: Secondary | ICD-10-CM | POA: Diagnosis not present

## 2020-10-01 ENCOUNTER — Encounter: Payer: Self-pay | Admitting: *Deleted

## 2020-10-01 ENCOUNTER — Other Ambulatory Visit: Payer: Self-pay | Admitting: *Deleted

## 2020-10-01 ENCOUNTER — Other Ambulatory Visit: Payer: Self-pay

## 2020-10-01 DIAGNOSIS — R634 Abnormal weight loss: Secondary | ICD-10-CM | POA: Diagnosis not present

## 2020-10-01 DIAGNOSIS — R630 Anorexia: Secondary | ICD-10-CM | POA: Diagnosis not present

## 2020-10-01 DIAGNOSIS — C61 Malignant neoplasm of prostate: Secondary | ICD-10-CM | POA: Diagnosis not present

## 2020-10-01 DIAGNOSIS — E785 Hyperlipidemia, unspecified: Secondary | ICD-10-CM | POA: Diagnosis not present

## 2020-10-01 DIAGNOSIS — I25119 Atherosclerotic heart disease of native coronary artery with unspecified angina pectoris: Secondary | ICD-10-CM | POA: Diagnosis not present

## 2020-10-01 DIAGNOSIS — I10 Essential (primary) hypertension: Secondary | ICD-10-CM | POA: Diagnosis not present

## 2020-10-01 DIAGNOSIS — I252 Old myocardial infarction: Secondary | ICD-10-CM | POA: Diagnosis not present

## 2020-10-01 DIAGNOSIS — C7951 Secondary malignant neoplasm of bone: Secondary | ICD-10-CM | POA: Diagnosis not present

## 2020-10-01 DIAGNOSIS — G620 Drug-induced polyneuropathy: Secondary | ICD-10-CM | POA: Diagnosis not present

## 2020-10-01 MED ORDER — MORPHINE SULFATE (CONCENTRATE) 10 MG /0.5 ML PO SOLN
10.0000 mg | ORAL | 0 refills | Status: DC | PRN
  Filled 2020-10-01: qty 30, 5d supply, fill #0

## 2020-10-02 DIAGNOSIS — C61 Malignant neoplasm of prostate: Secondary | ICD-10-CM | POA: Diagnosis not present

## 2020-10-02 DIAGNOSIS — I10 Essential (primary) hypertension: Secondary | ICD-10-CM | POA: Diagnosis not present

## 2020-10-02 DIAGNOSIS — I25119 Atherosclerotic heart disease of native coronary artery with unspecified angina pectoris: Secondary | ICD-10-CM | POA: Diagnosis not present

## 2020-10-02 DIAGNOSIS — R634 Abnormal weight loss: Secondary | ICD-10-CM | POA: Diagnosis not present

## 2020-10-02 DIAGNOSIS — G620 Drug-induced polyneuropathy: Secondary | ICD-10-CM | POA: Diagnosis not present

## 2020-10-02 DIAGNOSIS — R630 Anorexia: Secondary | ICD-10-CM | POA: Diagnosis not present

## 2020-10-02 DIAGNOSIS — E785 Hyperlipidemia, unspecified: Secondary | ICD-10-CM | POA: Diagnosis not present

## 2020-10-02 DIAGNOSIS — C7951 Secondary malignant neoplasm of bone: Secondary | ICD-10-CM | POA: Diagnosis not present

## 2020-10-02 DIAGNOSIS — I252 Old myocardial infarction: Secondary | ICD-10-CM | POA: Diagnosis not present

## 2020-10-03 DIAGNOSIS — E785 Hyperlipidemia, unspecified: Secondary | ICD-10-CM | POA: Diagnosis not present

## 2020-10-03 DIAGNOSIS — I252 Old myocardial infarction: Secondary | ICD-10-CM | POA: Diagnosis not present

## 2020-10-03 DIAGNOSIS — I10 Essential (primary) hypertension: Secondary | ICD-10-CM | POA: Diagnosis not present

## 2020-10-03 DIAGNOSIS — G620 Drug-induced polyneuropathy: Secondary | ICD-10-CM | POA: Diagnosis not present

## 2020-10-03 DIAGNOSIS — C7951 Secondary malignant neoplasm of bone: Secondary | ICD-10-CM | POA: Diagnosis not present

## 2020-10-03 DIAGNOSIS — I25119 Atherosclerotic heart disease of native coronary artery with unspecified angina pectoris: Secondary | ICD-10-CM | POA: Diagnosis not present

## 2020-10-03 DIAGNOSIS — C61 Malignant neoplasm of prostate: Secondary | ICD-10-CM | POA: Diagnosis not present

## 2020-10-03 DIAGNOSIS — R634 Abnormal weight loss: Secondary | ICD-10-CM | POA: Diagnosis not present

## 2020-10-03 DIAGNOSIS — R630 Anorexia: Secondary | ICD-10-CM | POA: Diagnosis not present

## 2020-10-04 DIAGNOSIS — C7951 Secondary malignant neoplasm of bone: Secondary | ICD-10-CM | POA: Diagnosis not present

## 2020-10-04 DIAGNOSIS — C61 Malignant neoplasm of prostate: Secondary | ICD-10-CM | POA: Diagnosis not present

## 2020-10-04 DIAGNOSIS — I10 Essential (primary) hypertension: Secondary | ICD-10-CM | POA: Diagnosis not present

## 2020-10-04 DIAGNOSIS — R630 Anorexia: Secondary | ICD-10-CM | POA: Diagnosis not present

## 2020-10-04 DIAGNOSIS — R634 Abnormal weight loss: Secondary | ICD-10-CM | POA: Diagnosis not present

## 2020-10-04 DIAGNOSIS — I252 Old myocardial infarction: Secondary | ICD-10-CM | POA: Diagnosis not present

## 2020-10-04 DIAGNOSIS — E785 Hyperlipidemia, unspecified: Secondary | ICD-10-CM | POA: Diagnosis not present

## 2020-10-04 DIAGNOSIS — I25119 Atherosclerotic heart disease of native coronary artery with unspecified angina pectoris: Secondary | ICD-10-CM | POA: Diagnosis not present

## 2020-10-04 DIAGNOSIS — G620 Drug-induced polyneuropathy: Secondary | ICD-10-CM | POA: Diagnosis not present

## 2020-10-05 DIAGNOSIS — C7951 Secondary malignant neoplasm of bone: Secondary | ICD-10-CM | POA: Diagnosis not present

## 2020-10-05 DIAGNOSIS — I10 Essential (primary) hypertension: Secondary | ICD-10-CM | POA: Diagnosis not present

## 2020-10-05 DIAGNOSIS — C61 Malignant neoplasm of prostate: Secondary | ICD-10-CM | POA: Diagnosis not present

## 2020-10-05 DIAGNOSIS — R630 Anorexia: Secondary | ICD-10-CM | POA: Diagnosis not present

## 2020-10-05 DIAGNOSIS — E785 Hyperlipidemia, unspecified: Secondary | ICD-10-CM | POA: Diagnosis not present

## 2020-10-05 DIAGNOSIS — G620 Drug-induced polyneuropathy: Secondary | ICD-10-CM | POA: Diagnosis not present

## 2020-10-05 DIAGNOSIS — I252 Old myocardial infarction: Secondary | ICD-10-CM | POA: Diagnosis not present

## 2020-10-05 DIAGNOSIS — R634 Abnormal weight loss: Secondary | ICD-10-CM | POA: Diagnosis not present

## 2020-10-05 DIAGNOSIS — I25119 Atherosclerotic heart disease of native coronary artery with unspecified angina pectoris: Secondary | ICD-10-CM | POA: Diagnosis not present

## 2020-10-06 DIAGNOSIS — G620 Drug-induced polyneuropathy: Secondary | ICD-10-CM | POA: Diagnosis not present

## 2020-10-06 DIAGNOSIS — E785 Hyperlipidemia, unspecified: Secondary | ICD-10-CM | POA: Diagnosis not present

## 2020-10-06 DIAGNOSIS — I25119 Atherosclerotic heart disease of native coronary artery with unspecified angina pectoris: Secondary | ICD-10-CM | POA: Diagnosis not present

## 2020-10-06 DIAGNOSIS — R634 Abnormal weight loss: Secondary | ICD-10-CM | POA: Diagnosis not present

## 2020-10-06 DIAGNOSIS — C7951 Secondary malignant neoplasm of bone: Secondary | ICD-10-CM | POA: Diagnosis not present

## 2020-10-06 DIAGNOSIS — R630 Anorexia: Secondary | ICD-10-CM | POA: Diagnosis not present

## 2020-10-06 DIAGNOSIS — I252 Old myocardial infarction: Secondary | ICD-10-CM | POA: Diagnosis not present

## 2020-10-06 DIAGNOSIS — I10 Essential (primary) hypertension: Secondary | ICD-10-CM | POA: Diagnosis not present

## 2020-10-06 DIAGNOSIS — C61 Malignant neoplasm of prostate: Secondary | ICD-10-CM | POA: Diagnosis not present

## 2020-10-07 ENCOUNTER — Other Ambulatory Visit: Payer: Self-pay | Admitting: *Deleted

## 2020-10-07 ENCOUNTER — Other Ambulatory Visit: Payer: Self-pay

## 2020-10-07 DIAGNOSIS — I25119 Atherosclerotic heart disease of native coronary artery with unspecified angina pectoris: Secondary | ICD-10-CM | POA: Diagnosis not present

## 2020-10-07 DIAGNOSIS — E785 Hyperlipidemia, unspecified: Secondary | ICD-10-CM | POA: Diagnosis not present

## 2020-10-07 DIAGNOSIS — C7951 Secondary malignant neoplasm of bone: Secondary | ICD-10-CM | POA: Diagnosis not present

## 2020-10-07 DIAGNOSIS — R634 Abnormal weight loss: Secondary | ICD-10-CM | POA: Diagnosis not present

## 2020-10-07 DIAGNOSIS — G620 Drug-induced polyneuropathy: Secondary | ICD-10-CM | POA: Diagnosis not present

## 2020-10-07 DIAGNOSIS — I252 Old myocardial infarction: Secondary | ICD-10-CM | POA: Diagnosis not present

## 2020-10-07 DIAGNOSIS — I10 Essential (primary) hypertension: Secondary | ICD-10-CM | POA: Diagnosis not present

## 2020-10-07 DIAGNOSIS — R630 Anorexia: Secondary | ICD-10-CM | POA: Diagnosis not present

## 2020-10-07 DIAGNOSIS — C61 Malignant neoplasm of prostate: Secondary | ICD-10-CM | POA: Diagnosis not present

## 2020-10-07 MED ORDER — MORPHINE SULFATE (CONCENTRATE) 10 MG /0.5 ML PO SOLN
10.0000 mg | ORAL | 0 refills | Status: DC | PRN
  Filled 2020-10-07: qty 30, 5d supply, fill #0

## 2020-10-08 ENCOUNTER — Inpatient Hospital Stay: Payer: 59

## 2020-10-08 ENCOUNTER — Inpatient Hospital Stay: Payer: 59 | Admitting: Internal Medicine

## 2020-10-08 DIAGNOSIS — G620 Drug-induced polyneuropathy: Secondary | ICD-10-CM | POA: Diagnosis not present

## 2020-10-08 DIAGNOSIS — C7951 Secondary malignant neoplasm of bone: Secondary | ICD-10-CM | POA: Diagnosis not present

## 2020-10-08 DIAGNOSIS — R634 Abnormal weight loss: Secondary | ICD-10-CM | POA: Diagnosis not present

## 2020-10-08 DIAGNOSIS — I252 Old myocardial infarction: Secondary | ICD-10-CM | POA: Diagnosis not present

## 2020-10-08 DIAGNOSIS — I10 Essential (primary) hypertension: Secondary | ICD-10-CM | POA: Diagnosis not present

## 2020-10-08 DIAGNOSIS — R630 Anorexia: Secondary | ICD-10-CM | POA: Diagnosis not present

## 2020-10-08 DIAGNOSIS — C61 Malignant neoplasm of prostate: Secondary | ICD-10-CM | POA: Diagnosis not present

## 2020-10-08 DIAGNOSIS — I25119 Atherosclerotic heart disease of native coronary artery with unspecified angina pectoris: Secondary | ICD-10-CM | POA: Diagnosis not present

## 2020-10-08 DIAGNOSIS — E785 Hyperlipidemia, unspecified: Secondary | ICD-10-CM | POA: Diagnosis not present

## 2020-10-09 DIAGNOSIS — C7951 Secondary malignant neoplasm of bone: Secondary | ICD-10-CM | POA: Diagnosis not present

## 2020-10-09 DIAGNOSIS — I25119 Atherosclerotic heart disease of native coronary artery with unspecified angina pectoris: Secondary | ICD-10-CM | POA: Diagnosis not present

## 2020-10-09 DIAGNOSIS — R630 Anorexia: Secondary | ICD-10-CM | POA: Diagnosis not present

## 2020-10-09 DIAGNOSIS — I10 Essential (primary) hypertension: Secondary | ICD-10-CM | POA: Diagnosis not present

## 2020-10-09 DIAGNOSIS — E785 Hyperlipidemia, unspecified: Secondary | ICD-10-CM | POA: Diagnosis not present

## 2020-10-09 DIAGNOSIS — G620 Drug-induced polyneuropathy: Secondary | ICD-10-CM | POA: Diagnosis not present

## 2020-10-09 DIAGNOSIS — I252 Old myocardial infarction: Secondary | ICD-10-CM | POA: Diagnosis not present

## 2020-10-09 DIAGNOSIS — C61 Malignant neoplasm of prostate: Secondary | ICD-10-CM | POA: Diagnosis not present

## 2020-10-09 DIAGNOSIS — R634 Abnormal weight loss: Secondary | ICD-10-CM | POA: Diagnosis not present

## 2020-10-10 DIAGNOSIS — G620 Drug-induced polyneuropathy: Secondary | ICD-10-CM | POA: Diagnosis not present

## 2020-10-10 DIAGNOSIS — E785 Hyperlipidemia, unspecified: Secondary | ICD-10-CM | POA: Diagnosis not present

## 2020-10-10 DIAGNOSIS — R630 Anorexia: Secondary | ICD-10-CM | POA: Diagnosis not present

## 2020-10-10 DIAGNOSIS — I25119 Atherosclerotic heart disease of native coronary artery with unspecified angina pectoris: Secondary | ICD-10-CM | POA: Diagnosis not present

## 2020-10-10 DIAGNOSIS — C7951 Secondary malignant neoplasm of bone: Secondary | ICD-10-CM | POA: Diagnosis not present

## 2020-10-10 DIAGNOSIS — C61 Malignant neoplasm of prostate: Secondary | ICD-10-CM | POA: Diagnosis not present

## 2020-10-10 DIAGNOSIS — I252 Old myocardial infarction: Secondary | ICD-10-CM | POA: Diagnosis not present

## 2020-10-10 DIAGNOSIS — I10 Essential (primary) hypertension: Secondary | ICD-10-CM | POA: Diagnosis not present

## 2020-10-10 DIAGNOSIS — R634 Abnormal weight loss: Secondary | ICD-10-CM | POA: Diagnosis not present

## 2020-10-11 DIAGNOSIS — C61 Malignant neoplasm of prostate: Secondary | ICD-10-CM | POA: Diagnosis not present

## 2020-10-11 DIAGNOSIS — C7951 Secondary malignant neoplasm of bone: Secondary | ICD-10-CM | POA: Diagnosis not present

## 2020-10-11 DIAGNOSIS — I25119 Atherosclerotic heart disease of native coronary artery with unspecified angina pectoris: Secondary | ICD-10-CM | POA: Diagnosis not present

## 2020-10-11 DIAGNOSIS — R630 Anorexia: Secondary | ICD-10-CM | POA: Diagnosis not present

## 2020-10-11 DIAGNOSIS — G620 Drug-induced polyneuropathy: Secondary | ICD-10-CM | POA: Diagnosis not present

## 2020-10-11 DIAGNOSIS — I252 Old myocardial infarction: Secondary | ICD-10-CM | POA: Diagnosis not present

## 2020-10-11 DIAGNOSIS — E785 Hyperlipidemia, unspecified: Secondary | ICD-10-CM | POA: Diagnosis not present

## 2020-10-11 DIAGNOSIS — R634 Abnormal weight loss: Secondary | ICD-10-CM | POA: Diagnosis not present

## 2020-10-11 DIAGNOSIS — I10 Essential (primary) hypertension: Secondary | ICD-10-CM | POA: Diagnosis not present

## 2020-10-12 ENCOUNTER — Other Ambulatory Visit: Payer: Self-pay | Admitting: Hospice and Palliative Medicine

## 2020-10-12 ENCOUNTER — Other Ambulatory Visit: Payer: Self-pay

## 2020-10-12 DIAGNOSIS — G620 Drug-induced polyneuropathy: Secondary | ICD-10-CM | POA: Diagnosis not present

## 2020-10-12 DIAGNOSIS — C7951 Secondary malignant neoplasm of bone: Secondary | ICD-10-CM | POA: Diagnosis not present

## 2020-10-12 DIAGNOSIS — R634 Abnormal weight loss: Secondary | ICD-10-CM | POA: Diagnosis not present

## 2020-10-12 DIAGNOSIS — I252 Old myocardial infarction: Secondary | ICD-10-CM | POA: Diagnosis not present

## 2020-10-12 DIAGNOSIS — R630 Anorexia: Secondary | ICD-10-CM | POA: Diagnosis not present

## 2020-10-12 DIAGNOSIS — C61 Malignant neoplasm of prostate: Secondary | ICD-10-CM | POA: Diagnosis not present

## 2020-10-12 DIAGNOSIS — I25119 Atherosclerotic heart disease of native coronary artery with unspecified angina pectoris: Secondary | ICD-10-CM | POA: Diagnosis not present

## 2020-10-12 DIAGNOSIS — I10 Essential (primary) hypertension: Secondary | ICD-10-CM | POA: Diagnosis not present

## 2020-10-12 DIAGNOSIS — E785 Hyperlipidemia, unspecified: Secondary | ICD-10-CM | POA: Diagnosis not present

## 2020-10-12 MED ORDER — LORAZEPAM 0.5 MG PO TABS
0.5000 mg | ORAL_TABLET | ORAL | 0 refills | Status: DC | PRN
  Filled 2020-10-12: qty 84, 14d supply, fill #0

## 2020-10-13 DIAGNOSIS — R634 Abnormal weight loss: Secondary | ICD-10-CM | POA: Diagnosis not present

## 2020-10-13 DIAGNOSIS — I10 Essential (primary) hypertension: Secondary | ICD-10-CM | POA: Diagnosis not present

## 2020-10-13 DIAGNOSIS — G620 Drug-induced polyneuropathy: Secondary | ICD-10-CM | POA: Diagnosis not present

## 2020-10-13 DIAGNOSIS — E785 Hyperlipidemia, unspecified: Secondary | ICD-10-CM | POA: Diagnosis not present

## 2020-10-13 DIAGNOSIS — I25119 Atherosclerotic heart disease of native coronary artery with unspecified angina pectoris: Secondary | ICD-10-CM | POA: Diagnosis not present

## 2020-10-13 DIAGNOSIS — R630 Anorexia: Secondary | ICD-10-CM | POA: Diagnosis not present

## 2020-10-13 DIAGNOSIS — I252 Old myocardial infarction: Secondary | ICD-10-CM | POA: Diagnosis not present

## 2020-10-13 DIAGNOSIS — C61 Malignant neoplasm of prostate: Secondary | ICD-10-CM | POA: Diagnosis not present

## 2020-10-13 DIAGNOSIS — C7951 Secondary malignant neoplasm of bone: Secondary | ICD-10-CM | POA: Diagnosis not present

## 2020-10-14 ENCOUNTER — Other Ambulatory Visit: Payer: Self-pay | Admitting: Hospice and Palliative Medicine

## 2020-10-14 ENCOUNTER — Other Ambulatory Visit: Payer: Self-pay

## 2020-10-14 DIAGNOSIS — E785 Hyperlipidemia, unspecified: Secondary | ICD-10-CM | POA: Diagnosis not present

## 2020-10-14 DIAGNOSIS — I10 Essential (primary) hypertension: Secondary | ICD-10-CM | POA: Diagnosis not present

## 2020-10-14 DIAGNOSIS — R634 Abnormal weight loss: Secondary | ICD-10-CM | POA: Diagnosis not present

## 2020-10-14 DIAGNOSIS — I25119 Atherosclerotic heart disease of native coronary artery with unspecified angina pectoris: Secondary | ICD-10-CM | POA: Diagnosis not present

## 2020-10-14 DIAGNOSIS — C61 Malignant neoplasm of prostate: Secondary | ICD-10-CM | POA: Diagnosis not present

## 2020-10-14 DIAGNOSIS — G620 Drug-induced polyneuropathy: Secondary | ICD-10-CM | POA: Diagnosis not present

## 2020-10-14 DIAGNOSIS — R630 Anorexia: Secondary | ICD-10-CM | POA: Diagnosis not present

## 2020-10-14 DIAGNOSIS — C7951 Secondary malignant neoplasm of bone: Secondary | ICD-10-CM | POA: Diagnosis not present

## 2020-10-14 DIAGNOSIS — I252 Old myocardial infarction: Secondary | ICD-10-CM | POA: Diagnosis not present

## 2020-10-14 MED ORDER — MORPHINE SULFATE (CONCENTRATE) 10 MG /0.5 ML PO SOLN
10.0000 mg | ORAL | 0 refills | Status: DC | PRN
  Filled 2020-10-14: qty 30, 5d supply, fill #0

## 2020-10-14 NOTE — Progress Notes (Signed)
Hospice nurse, Amy, requested refill of morphine elixir. Will send to pharmacy.

## 2020-10-15 DIAGNOSIS — I10 Essential (primary) hypertension: Secondary | ICD-10-CM | POA: Diagnosis not present

## 2020-10-15 DIAGNOSIS — I252 Old myocardial infarction: Secondary | ICD-10-CM | POA: Diagnosis not present

## 2020-10-15 DIAGNOSIS — I25119 Atherosclerotic heart disease of native coronary artery with unspecified angina pectoris: Secondary | ICD-10-CM | POA: Diagnosis not present

## 2020-10-15 DIAGNOSIS — C61 Malignant neoplasm of prostate: Secondary | ICD-10-CM | POA: Diagnosis not present

## 2020-10-15 DIAGNOSIS — R630 Anorexia: Secondary | ICD-10-CM | POA: Diagnosis not present

## 2020-10-15 DIAGNOSIS — R634 Abnormal weight loss: Secondary | ICD-10-CM | POA: Diagnosis not present

## 2020-10-15 DIAGNOSIS — E785 Hyperlipidemia, unspecified: Secondary | ICD-10-CM | POA: Diagnosis not present

## 2020-10-15 DIAGNOSIS — C7951 Secondary malignant neoplasm of bone: Secondary | ICD-10-CM | POA: Diagnosis not present

## 2020-10-15 DIAGNOSIS — G620 Drug-induced polyneuropathy: Secondary | ICD-10-CM | POA: Diagnosis not present

## 2020-10-16 DIAGNOSIS — E785 Hyperlipidemia, unspecified: Secondary | ICD-10-CM | POA: Diagnosis not present

## 2020-10-16 DIAGNOSIS — I25119 Atherosclerotic heart disease of native coronary artery with unspecified angina pectoris: Secondary | ICD-10-CM | POA: Diagnosis not present

## 2020-10-16 DIAGNOSIS — I10 Essential (primary) hypertension: Secondary | ICD-10-CM | POA: Diagnosis not present

## 2020-10-16 DIAGNOSIS — I252 Old myocardial infarction: Secondary | ICD-10-CM | POA: Diagnosis not present

## 2020-10-16 DIAGNOSIS — R630 Anorexia: Secondary | ICD-10-CM | POA: Diagnosis not present

## 2020-10-16 DIAGNOSIS — R634 Abnormal weight loss: Secondary | ICD-10-CM | POA: Diagnosis not present

## 2020-10-16 DIAGNOSIS — C61 Malignant neoplasm of prostate: Secondary | ICD-10-CM | POA: Diagnosis not present

## 2020-10-16 DIAGNOSIS — G620 Drug-induced polyneuropathy: Secondary | ICD-10-CM | POA: Diagnosis not present

## 2020-10-16 DIAGNOSIS — C7951 Secondary malignant neoplasm of bone: Secondary | ICD-10-CM | POA: Diagnosis not present

## 2020-10-17 DIAGNOSIS — C61 Malignant neoplasm of prostate: Secondary | ICD-10-CM | POA: Diagnosis not present

## 2020-10-17 DIAGNOSIS — C7951 Secondary malignant neoplasm of bone: Secondary | ICD-10-CM | POA: Diagnosis not present

## 2020-10-17 DIAGNOSIS — I252 Old myocardial infarction: Secondary | ICD-10-CM | POA: Diagnosis not present

## 2020-10-17 DIAGNOSIS — R634 Abnormal weight loss: Secondary | ICD-10-CM | POA: Diagnosis not present

## 2020-10-17 DIAGNOSIS — I25119 Atherosclerotic heart disease of native coronary artery with unspecified angina pectoris: Secondary | ICD-10-CM | POA: Diagnosis not present

## 2020-10-17 DIAGNOSIS — G620 Drug-induced polyneuropathy: Secondary | ICD-10-CM | POA: Diagnosis not present

## 2020-10-17 DIAGNOSIS — E785 Hyperlipidemia, unspecified: Secondary | ICD-10-CM | POA: Diagnosis not present

## 2020-10-17 DIAGNOSIS — R630 Anorexia: Secondary | ICD-10-CM | POA: Diagnosis not present

## 2020-10-17 DIAGNOSIS — I10 Essential (primary) hypertension: Secondary | ICD-10-CM | POA: Diagnosis not present

## 2020-10-18 DIAGNOSIS — I25119 Atherosclerotic heart disease of native coronary artery with unspecified angina pectoris: Secondary | ICD-10-CM | POA: Diagnosis not present

## 2020-10-18 DIAGNOSIS — R630 Anorexia: Secondary | ICD-10-CM | POA: Diagnosis not present

## 2020-10-18 DIAGNOSIS — C61 Malignant neoplasm of prostate: Secondary | ICD-10-CM | POA: Diagnosis not present

## 2020-10-18 DIAGNOSIS — C7951 Secondary malignant neoplasm of bone: Secondary | ICD-10-CM | POA: Diagnosis not present

## 2020-10-18 DIAGNOSIS — I10 Essential (primary) hypertension: Secondary | ICD-10-CM | POA: Diagnosis not present

## 2020-10-18 DIAGNOSIS — G620 Drug-induced polyneuropathy: Secondary | ICD-10-CM | POA: Diagnosis not present

## 2020-10-18 DIAGNOSIS — I252 Old myocardial infarction: Secondary | ICD-10-CM | POA: Diagnosis not present

## 2020-10-18 DIAGNOSIS — E785 Hyperlipidemia, unspecified: Secondary | ICD-10-CM | POA: Diagnosis not present

## 2020-10-18 DIAGNOSIS — R634 Abnormal weight loss: Secondary | ICD-10-CM | POA: Diagnosis not present

## 2020-10-19 DIAGNOSIS — R630 Anorexia: Secondary | ICD-10-CM | POA: Diagnosis not present

## 2020-10-19 DIAGNOSIS — C7951 Secondary malignant neoplasm of bone: Secondary | ICD-10-CM | POA: Diagnosis not present

## 2020-10-19 DIAGNOSIS — E785 Hyperlipidemia, unspecified: Secondary | ICD-10-CM | POA: Diagnosis not present

## 2020-10-19 DIAGNOSIS — G620 Drug-induced polyneuropathy: Secondary | ICD-10-CM | POA: Diagnosis not present

## 2020-10-19 DIAGNOSIS — I25119 Atherosclerotic heart disease of native coronary artery with unspecified angina pectoris: Secondary | ICD-10-CM | POA: Diagnosis not present

## 2020-10-19 DIAGNOSIS — C61 Malignant neoplasm of prostate: Secondary | ICD-10-CM | POA: Diagnosis not present

## 2020-10-19 DIAGNOSIS — I252 Old myocardial infarction: Secondary | ICD-10-CM | POA: Diagnosis not present

## 2020-10-19 DIAGNOSIS — I10 Essential (primary) hypertension: Secondary | ICD-10-CM | POA: Diagnosis not present

## 2020-10-19 DIAGNOSIS — R634 Abnormal weight loss: Secondary | ICD-10-CM | POA: Diagnosis not present

## 2020-10-20 ENCOUNTER — Other Ambulatory Visit: Payer: Self-pay | Admitting: Hospice and Palliative Medicine

## 2020-10-20 ENCOUNTER — Other Ambulatory Visit: Payer: Self-pay

## 2020-10-20 DIAGNOSIS — C61 Malignant neoplasm of prostate: Secondary | ICD-10-CM | POA: Diagnosis not present

## 2020-10-20 DIAGNOSIS — I25119 Atherosclerotic heart disease of native coronary artery with unspecified angina pectoris: Secondary | ICD-10-CM | POA: Diagnosis not present

## 2020-10-20 DIAGNOSIS — I10 Essential (primary) hypertension: Secondary | ICD-10-CM | POA: Diagnosis not present

## 2020-10-20 DIAGNOSIS — G620 Drug-induced polyneuropathy: Secondary | ICD-10-CM | POA: Diagnosis not present

## 2020-10-20 DIAGNOSIS — R634 Abnormal weight loss: Secondary | ICD-10-CM | POA: Diagnosis not present

## 2020-10-20 DIAGNOSIS — R630 Anorexia: Secondary | ICD-10-CM | POA: Diagnosis not present

## 2020-10-20 DIAGNOSIS — C7951 Secondary malignant neoplasm of bone: Secondary | ICD-10-CM | POA: Diagnosis not present

## 2020-10-20 DIAGNOSIS — E785 Hyperlipidemia, unspecified: Secondary | ICD-10-CM | POA: Diagnosis not present

## 2020-10-20 DIAGNOSIS — I252 Old myocardial infarction: Secondary | ICD-10-CM | POA: Diagnosis not present

## 2020-10-20 MED ORDER — FENTANYL 100 MCG/HR TD PT72
MEDICATED_PATCH | TRANSDERMAL | 0 refills | Status: DC
  Filled 2020-10-20: qty 10, 20d supply, fill #0

## 2020-10-20 MED ORDER — MORPHINE SULFATE (CONCENTRATE) 10 MG /0.5 ML PO SOLN
10.0000 mg | ORAL | 0 refills | Status: DC | PRN
  Filled 2020-10-20 (×2): qty 30, 5d supply, fill #0

## 2020-10-20 MED ORDER — FENTANYL 50 MCG/HR TD PT72
MEDICATED_PATCH | TRANSDERMAL | 0 refills | Status: DC
  Filled 2020-10-20: qty 10, 20d supply, fill #0

## 2020-10-20 NOTE — Progress Notes (Signed)
Spoke with patient's hospice nurse, Amy.  We discussed strategies for his pain management.  Hospice had requested the PCA which we discussed in detail.  However, patient would like to continue current regimen of morphine and fentanyl for now.  Refills sent for both.

## 2020-10-21 ENCOUNTER — Other Ambulatory Visit: Payer: Self-pay

## 2020-10-21 DIAGNOSIS — I25119 Atherosclerotic heart disease of native coronary artery with unspecified angina pectoris: Secondary | ICD-10-CM | POA: Diagnosis not present

## 2020-10-21 DIAGNOSIS — R634 Abnormal weight loss: Secondary | ICD-10-CM | POA: Diagnosis not present

## 2020-10-21 DIAGNOSIS — I252 Old myocardial infarction: Secondary | ICD-10-CM | POA: Diagnosis not present

## 2020-10-21 DIAGNOSIS — R630 Anorexia: Secondary | ICD-10-CM | POA: Diagnosis not present

## 2020-10-21 DIAGNOSIS — C7951 Secondary malignant neoplasm of bone: Secondary | ICD-10-CM | POA: Diagnosis not present

## 2020-10-21 DIAGNOSIS — C61 Malignant neoplasm of prostate: Secondary | ICD-10-CM | POA: Diagnosis not present

## 2020-10-21 DIAGNOSIS — G620 Drug-induced polyneuropathy: Secondary | ICD-10-CM | POA: Diagnosis not present

## 2020-10-21 DIAGNOSIS — E785 Hyperlipidemia, unspecified: Secondary | ICD-10-CM | POA: Diagnosis not present

## 2020-10-21 DIAGNOSIS — I10 Essential (primary) hypertension: Secondary | ICD-10-CM | POA: Diagnosis not present

## 2020-10-22 DIAGNOSIS — E785 Hyperlipidemia, unspecified: Secondary | ICD-10-CM | POA: Diagnosis not present

## 2020-10-22 DIAGNOSIS — I252 Old myocardial infarction: Secondary | ICD-10-CM | POA: Diagnosis not present

## 2020-10-22 DIAGNOSIS — R634 Abnormal weight loss: Secondary | ICD-10-CM | POA: Diagnosis not present

## 2020-10-22 DIAGNOSIS — R630 Anorexia: Secondary | ICD-10-CM | POA: Diagnosis not present

## 2020-10-22 DIAGNOSIS — C61 Malignant neoplasm of prostate: Secondary | ICD-10-CM | POA: Diagnosis not present

## 2020-10-22 DIAGNOSIS — G620 Drug-induced polyneuropathy: Secondary | ICD-10-CM | POA: Diagnosis not present

## 2020-10-22 DIAGNOSIS — I10 Essential (primary) hypertension: Secondary | ICD-10-CM | POA: Diagnosis not present

## 2020-10-22 DIAGNOSIS — I25119 Atherosclerotic heart disease of native coronary artery with unspecified angina pectoris: Secondary | ICD-10-CM | POA: Diagnosis not present

## 2020-10-22 DIAGNOSIS — C7951 Secondary malignant neoplasm of bone: Secondary | ICD-10-CM | POA: Diagnosis not present

## 2020-10-23 DIAGNOSIS — R630 Anorexia: Secondary | ICD-10-CM | POA: Diagnosis not present

## 2020-10-23 DIAGNOSIS — C7951 Secondary malignant neoplasm of bone: Secondary | ICD-10-CM | POA: Diagnosis not present

## 2020-10-23 DIAGNOSIS — C61 Malignant neoplasm of prostate: Secondary | ICD-10-CM | POA: Diagnosis not present

## 2020-10-23 DIAGNOSIS — I252 Old myocardial infarction: Secondary | ICD-10-CM | POA: Diagnosis not present

## 2020-10-23 DIAGNOSIS — R634 Abnormal weight loss: Secondary | ICD-10-CM | POA: Diagnosis not present

## 2020-10-23 DIAGNOSIS — G620 Drug-induced polyneuropathy: Secondary | ICD-10-CM | POA: Diagnosis not present

## 2020-10-23 DIAGNOSIS — E785 Hyperlipidemia, unspecified: Secondary | ICD-10-CM | POA: Diagnosis not present

## 2020-10-23 DIAGNOSIS — I25119 Atherosclerotic heart disease of native coronary artery with unspecified angina pectoris: Secondary | ICD-10-CM | POA: Diagnosis not present

## 2020-10-23 DIAGNOSIS — I10 Essential (primary) hypertension: Secondary | ICD-10-CM | POA: Diagnosis not present

## 2020-10-24 DIAGNOSIS — R634 Abnormal weight loss: Secondary | ICD-10-CM | POA: Diagnosis not present

## 2020-10-24 DIAGNOSIS — C7951 Secondary malignant neoplasm of bone: Secondary | ICD-10-CM | POA: Diagnosis not present

## 2020-10-24 DIAGNOSIS — G620 Drug-induced polyneuropathy: Secondary | ICD-10-CM | POA: Diagnosis not present

## 2020-10-24 DIAGNOSIS — I252 Old myocardial infarction: Secondary | ICD-10-CM | POA: Diagnosis not present

## 2020-10-24 DIAGNOSIS — R630 Anorexia: Secondary | ICD-10-CM | POA: Diagnosis not present

## 2020-10-24 DIAGNOSIS — I10 Essential (primary) hypertension: Secondary | ICD-10-CM | POA: Diagnosis not present

## 2020-10-24 DIAGNOSIS — I25119 Atherosclerotic heart disease of native coronary artery with unspecified angina pectoris: Secondary | ICD-10-CM | POA: Diagnosis not present

## 2020-10-24 DIAGNOSIS — C61 Malignant neoplasm of prostate: Secondary | ICD-10-CM | POA: Diagnosis not present

## 2020-10-24 DIAGNOSIS — E785 Hyperlipidemia, unspecified: Secondary | ICD-10-CM | POA: Diagnosis not present

## 2020-10-25 DIAGNOSIS — I10 Essential (primary) hypertension: Secondary | ICD-10-CM | POA: Diagnosis not present

## 2020-10-25 DIAGNOSIS — I252 Old myocardial infarction: Secondary | ICD-10-CM | POA: Diagnosis not present

## 2020-10-25 DIAGNOSIS — R634 Abnormal weight loss: Secondary | ICD-10-CM | POA: Diagnosis not present

## 2020-10-25 DIAGNOSIS — I25119 Atherosclerotic heart disease of native coronary artery with unspecified angina pectoris: Secondary | ICD-10-CM | POA: Diagnosis not present

## 2020-10-25 DIAGNOSIS — C61 Malignant neoplasm of prostate: Secondary | ICD-10-CM | POA: Diagnosis not present

## 2020-10-25 DIAGNOSIS — E785 Hyperlipidemia, unspecified: Secondary | ICD-10-CM | POA: Diagnosis not present

## 2020-10-25 DIAGNOSIS — R630 Anorexia: Secondary | ICD-10-CM | POA: Diagnosis not present

## 2020-10-25 DIAGNOSIS — G620 Drug-induced polyneuropathy: Secondary | ICD-10-CM | POA: Diagnosis not present

## 2020-10-25 DIAGNOSIS — C7951 Secondary malignant neoplasm of bone: Secondary | ICD-10-CM | POA: Diagnosis not present

## 2020-10-26 DIAGNOSIS — R630 Anorexia: Secondary | ICD-10-CM | POA: Diagnosis not present

## 2020-10-26 DIAGNOSIS — C61 Malignant neoplasm of prostate: Secondary | ICD-10-CM | POA: Diagnosis not present

## 2020-10-26 DIAGNOSIS — I25119 Atherosclerotic heart disease of native coronary artery with unspecified angina pectoris: Secondary | ICD-10-CM | POA: Diagnosis not present

## 2020-10-26 DIAGNOSIS — R634 Abnormal weight loss: Secondary | ICD-10-CM | POA: Diagnosis not present

## 2020-10-26 DIAGNOSIS — I252 Old myocardial infarction: Secondary | ICD-10-CM | POA: Diagnosis not present

## 2020-10-26 DIAGNOSIS — E785 Hyperlipidemia, unspecified: Secondary | ICD-10-CM | POA: Diagnosis not present

## 2020-10-26 DIAGNOSIS — C7951 Secondary malignant neoplasm of bone: Secondary | ICD-10-CM | POA: Diagnosis not present

## 2020-10-26 DIAGNOSIS — G620 Drug-induced polyneuropathy: Secondary | ICD-10-CM | POA: Diagnosis not present

## 2020-10-26 DIAGNOSIS — I10 Essential (primary) hypertension: Secondary | ICD-10-CM | POA: Diagnosis not present

## 2020-10-27 DIAGNOSIS — E785 Hyperlipidemia, unspecified: Secondary | ICD-10-CM | POA: Diagnosis not present

## 2020-10-27 DIAGNOSIS — I252 Old myocardial infarction: Secondary | ICD-10-CM | POA: Diagnosis not present

## 2020-10-27 DIAGNOSIS — I25119 Atherosclerotic heart disease of native coronary artery with unspecified angina pectoris: Secondary | ICD-10-CM | POA: Diagnosis not present

## 2020-10-27 DIAGNOSIS — G620 Drug-induced polyneuropathy: Secondary | ICD-10-CM | POA: Diagnosis not present

## 2020-10-27 DIAGNOSIS — R634 Abnormal weight loss: Secondary | ICD-10-CM | POA: Diagnosis not present

## 2020-10-27 DIAGNOSIS — C61 Malignant neoplasm of prostate: Secondary | ICD-10-CM | POA: Diagnosis not present

## 2020-10-27 DIAGNOSIS — I10 Essential (primary) hypertension: Secondary | ICD-10-CM | POA: Diagnosis not present

## 2020-10-27 DIAGNOSIS — R630 Anorexia: Secondary | ICD-10-CM | POA: Diagnosis not present

## 2020-10-27 DIAGNOSIS — C7951 Secondary malignant neoplasm of bone: Secondary | ICD-10-CM | POA: Diagnosis not present

## 2020-10-28 DIAGNOSIS — E785 Hyperlipidemia, unspecified: Secondary | ICD-10-CM | POA: Diagnosis not present

## 2020-10-28 DIAGNOSIS — C61 Malignant neoplasm of prostate: Secondary | ICD-10-CM | POA: Diagnosis not present

## 2020-10-28 DIAGNOSIS — I25119 Atherosclerotic heart disease of native coronary artery with unspecified angina pectoris: Secondary | ICD-10-CM | POA: Diagnosis not present

## 2020-10-28 DIAGNOSIS — C7951 Secondary malignant neoplasm of bone: Secondary | ICD-10-CM | POA: Diagnosis not present

## 2020-10-28 DIAGNOSIS — G620 Drug-induced polyneuropathy: Secondary | ICD-10-CM | POA: Diagnosis not present

## 2020-10-28 DIAGNOSIS — R634 Abnormal weight loss: Secondary | ICD-10-CM | POA: Diagnosis not present

## 2020-10-28 DIAGNOSIS — R630 Anorexia: Secondary | ICD-10-CM | POA: Diagnosis not present

## 2020-10-28 DIAGNOSIS — I10 Essential (primary) hypertension: Secondary | ICD-10-CM | POA: Diagnosis not present

## 2020-10-28 DIAGNOSIS — I252 Old myocardial infarction: Secondary | ICD-10-CM | POA: Diagnosis not present

## 2020-10-29 DIAGNOSIS — I252 Old myocardial infarction: Secondary | ICD-10-CM | POA: Diagnosis not present

## 2020-10-29 DIAGNOSIS — R634 Abnormal weight loss: Secondary | ICD-10-CM | POA: Diagnosis not present

## 2020-10-29 DIAGNOSIS — G620 Drug-induced polyneuropathy: Secondary | ICD-10-CM | POA: Diagnosis not present

## 2020-10-29 DIAGNOSIS — C61 Malignant neoplasm of prostate: Secondary | ICD-10-CM | POA: Diagnosis not present

## 2020-10-29 DIAGNOSIS — E785 Hyperlipidemia, unspecified: Secondary | ICD-10-CM | POA: Diagnosis not present

## 2020-10-29 DIAGNOSIS — I10 Essential (primary) hypertension: Secondary | ICD-10-CM | POA: Diagnosis not present

## 2020-10-29 DIAGNOSIS — R630 Anorexia: Secondary | ICD-10-CM | POA: Diagnosis not present

## 2020-10-29 DIAGNOSIS — I25119 Atherosclerotic heart disease of native coronary artery with unspecified angina pectoris: Secondary | ICD-10-CM | POA: Diagnosis not present

## 2020-10-29 DIAGNOSIS — C7951 Secondary malignant neoplasm of bone: Secondary | ICD-10-CM | POA: Diagnosis not present

## 2020-10-30 DIAGNOSIS — I25119 Atherosclerotic heart disease of native coronary artery with unspecified angina pectoris: Secondary | ICD-10-CM | POA: Diagnosis not present

## 2020-10-30 DIAGNOSIS — E785 Hyperlipidemia, unspecified: Secondary | ICD-10-CM | POA: Diagnosis not present

## 2020-10-30 DIAGNOSIS — G620 Drug-induced polyneuropathy: Secondary | ICD-10-CM | POA: Diagnosis not present

## 2020-10-30 DIAGNOSIS — I10 Essential (primary) hypertension: Secondary | ICD-10-CM | POA: Diagnosis not present

## 2020-10-30 DIAGNOSIS — C7951 Secondary malignant neoplasm of bone: Secondary | ICD-10-CM | POA: Diagnosis not present

## 2020-10-30 DIAGNOSIS — R630 Anorexia: Secondary | ICD-10-CM | POA: Diagnosis not present

## 2020-10-30 DIAGNOSIS — C61 Malignant neoplasm of prostate: Secondary | ICD-10-CM | POA: Diagnosis not present

## 2020-10-30 DIAGNOSIS — R634 Abnormal weight loss: Secondary | ICD-10-CM | POA: Diagnosis not present

## 2020-10-30 DIAGNOSIS — I252 Old myocardial infarction: Secondary | ICD-10-CM | POA: Diagnosis not present

## 2020-10-31 DIAGNOSIS — R634 Abnormal weight loss: Secondary | ICD-10-CM | POA: Diagnosis not present

## 2020-10-31 DIAGNOSIS — R630 Anorexia: Secondary | ICD-10-CM | POA: Diagnosis not present

## 2020-10-31 DIAGNOSIS — C7951 Secondary malignant neoplasm of bone: Secondary | ICD-10-CM | POA: Diagnosis not present

## 2020-10-31 DIAGNOSIS — E785 Hyperlipidemia, unspecified: Secondary | ICD-10-CM | POA: Diagnosis not present

## 2020-10-31 DIAGNOSIS — G620 Drug-induced polyneuropathy: Secondary | ICD-10-CM | POA: Diagnosis not present

## 2020-10-31 DIAGNOSIS — I10 Essential (primary) hypertension: Secondary | ICD-10-CM | POA: Diagnosis not present

## 2020-10-31 DIAGNOSIS — I25119 Atherosclerotic heart disease of native coronary artery with unspecified angina pectoris: Secondary | ICD-10-CM | POA: Diagnosis not present

## 2020-10-31 DIAGNOSIS — I252 Old myocardial infarction: Secondary | ICD-10-CM | POA: Diagnosis not present

## 2020-10-31 DIAGNOSIS — C61 Malignant neoplasm of prostate: Secondary | ICD-10-CM | POA: Diagnosis not present

## 2020-11-01 DIAGNOSIS — C7951 Secondary malignant neoplasm of bone: Secondary | ICD-10-CM | POA: Diagnosis not present

## 2020-11-01 DIAGNOSIS — R630 Anorexia: Secondary | ICD-10-CM | POA: Diagnosis not present

## 2020-11-01 DIAGNOSIS — R634 Abnormal weight loss: Secondary | ICD-10-CM | POA: Diagnosis not present

## 2020-11-01 DIAGNOSIS — I25119 Atherosclerotic heart disease of native coronary artery with unspecified angina pectoris: Secondary | ICD-10-CM | POA: Diagnosis not present

## 2020-11-01 DIAGNOSIS — I252 Old myocardial infarction: Secondary | ICD-10-CM | POA: Diagnosis not present

## 2020-11-01 DIAGNOSIS — C61 Malignant neoplasm of prostate: Secondary | ICD-10-CM | POA: Diagnosis not present

## 2020-11-01 DIAGNOSIS — E785 Hyperlipidemia, unspecified: Secondary | ICD-10-CM | POA: Diagnosis not present

## 2020-11-01 DIAGNOSIS — I10 Essential (primary) hypertension: Secondary | ICD-10-CM | POA: Diagnosis not present

## 2020-11-01 DIAGNOSIS — G620 Drug-induced polyneuropathy: Secondary | ICD-10-CM | POA: Diagnosis not present

## 2020-11-02 ENCOUNTER — Emergency Department: Payer: 59

## 2020-11-02 ENCOUNTER — Other Ambulatory Visit: Payer: Self-pay | Admitting: Hospice and Palliative Medicine

## 2020-11-02 ENCOUNTER — Other Ambulatory Visit: Payer: Self-pay

## 2020-11-02 ENCOUNTER — Emergency Department
Admission: EM | Admit: 2020-11-02 | Discharge: 2020-11-03 | Disposition: A | Discharge status: Home / Self Care | Payer: 59 | Attending: Emergency Medicine | Admitting: Emergency Medicine

## 2020-11-02 DIAGNOSIS — M50323 Other cervical disc degeneration at C6-C7 level: Secondary | ICD-10-CM | POA: Diagnosis not present

## 2020-11-02 DIAGNOSIS — C799 Secondary malignant neoplasm of unspecified site: Secondary | ICD-10-CM

## 2020-11-02 DIAGNOSIS — R220 Localized swelling, mass and lump, head: Secondary | ICD-10-CM | POA: Diagnosis not present

## 2020-11-02 DIAGNOSIS — Z7982 Long term (current) use of aspirin: Secondary | ICD-10-CM | POA: Insufficient documentation

## 2020-11-02 DIAGNOSIS — Z7189 Other specified counseling: Secondary | ICD-10-CM

## 2020-11-02 DIAGNOSIS — I700 Atherosclerosis of aorta: Secondary | ICD-10-CM | POA: Diagnosis not present

## 2020-11-02 DIAGNOSIS — Z741 Need for assistance with personal care: Secondary | ICD-10-CM | POA: Diagnosis not present

## 2020-11-02 DIAGNOSIS — Y92002 Bathroom of unspecified non-institutional (private) residence single-family (private) house as the place of occurrence of the external cause: Secondary | ICD-10-CM | POA: Insufficient documentation

## 2020-11-02 DIAGNOSIS — S41111A Laceration without foreign body of right upper arm, initial encounter: Secondary | ICD-10-CM | POA: Insufficient documentation

## 2020-11-02 DIAGNOSIS — C7951 Secondary malignant neoplasm of bone: Secondary | ICD-10-CM | POA: Diagnosis not present

## 2020-11-02 DIAGNOSIS — Z9079 Acquired absence of other genital organ(s): Secondary | ICD-10-CM | POA: Diagnosis not present

## 2020-11-02 DIAGNOSIS — Z79899 Other long term (current) drug therapy: Secondary | ICD-10-CM | POA: Insufficient documentation

## 2020-11-02 DIAGNOSIS — M47817 Spondylosis without myelopathy or radiculopathy, lumbosacral region: Secondary | ICD-10-CM | POA: Diagnosis not present

## 2020-11-02 DIAGNOSIS — W19XXXA Unspecified fall, initial encounter: Secondary | ICD-10-CM

## 2020-11-02 DIAGNOSIS — S21119A Laceration without foreign body of unspecified front wall of thorax without penetration into thoracic cavity, initial encounter: Secondary | ICD-10-CM | POA: Insufficient documentation

## 2020-11-02 DIAGNOSIS — I10 Essential (primary) hypertension: Secondary | ICD-10-CM | POA: Insufficient documentation

## 2020-11-02 DIAGNOSIS — M5134 Other intervertebral disc degeneration, thoracic region: Secondary | ICD-10-CM | POA: Diagnosis not present

## 2020-11-02 DIAGNOSIS — C61 Malignant neoplasm of prostate: Secondary | ICD-10-CM | POA: Diagnosis not present

## 2020-11-02 DIAGNOSIS — M47814 Spondylosis without myelopathy or radiculopathy, thoracic region: Secondary | ICD-10-CM | POA: Diagnosis not present

## 2020-11-02 DIAGNOSIS — Z87891 Personal history of nicotine dependence: Secondary | ICD-10-CM | POA: Diagnosis not present

## 2020-11-02 DIAGNOSIS — Z955 Presence of coronary angioplasty implant and graft: Secondary | ICD-10-CM | POA: Insufficient documentation

## 2020-11-02 DIAGNOSIS — I2510 Atherosclerotic heart disease of native coronary artery without angina pectoris: Secondary | ICD-10-CM | POA: Diagnosis not present

## 2020-11-02 DIAGNOSIS — S31119A Laceration without foreign body of abdominal wall, unspecified quadrant without penetration into peritoneal cavity, initial encounter: Secondary | ICD-10-CM | POA: Insufficient documentation

## 2020-11-02 DIAGNOSIS — S41112A Laceration without foreign body of left upper arm, initial encounter: Secondary | ICD-10-CM | POA: Insufficient documentation

## 2020-11-02 DIAGNOSIS — S299XXA Unspecified injury of thorax, initial encounter: Secondary | ICD-10-CM | POA: Diagnosis present

## 2020-11-02 DIAGNOSIS — Z043 Encounter for examination and observation following other accident: Secondary | ICD-10-CM | POA: Diagnosis not present

## 2020-11-02 DIAGNOSIS — S199XXA Unspecified injury of neck, initial encounter: Secondary | ICD-10-CM | POA: Diagnosis not present

## 2020-11-02 DIAGNOSIS — N3090 Cystitis, unspecified without hematuria: Secondary | ICD-10-CM | POA: Diagnosis not present

## 2020-11-02 DIAGNOSIS — R410 Disorientation, unspecified: Secondary | ICD-10-CM | POA: Diagnosis not present

## 2020-11-02 DIAGNOSIS — W182XXA Fall in (into) shower or empty bathtub, initial encounter: Secondary | ICD-10-CM | POA: Diagnosis not present

## 2020-11-02 DIAGNOSIS — R5382 Chronic fatigue, unspecified: Secondary | ICD-10-CM | POA: Diagnosis not present

## 2020-11-02 DIAGNOSIS — S0990XA Unspecified injury of head, initial encounter: Secondary | ICD-10-CM | POA: Diagnosis not present

## 2020-11-02 DIAGNOSIS — Z6820 Body mass index (BMI) 20.0-20.9, adult: Secondary | ICD-10-CM | POA: Diagnosis not present

## 2020-11-02 DIAGNOSIS — Z85830 Personal history of malignant neoplasm of bone: Secondary | ICD-10-CM | POA: Diagnosis not present

## 2020-11-02 DIAGNOSIS — J986 Disorders of diaphragm: Secondary | ICD-10-CM | POA: Diagnosis not present

## 2020-11-02 DIAGNOSIS — T148XXA Other injury of unspecified body region, initial encounter: Secondary | ICD-10-CM

## 2020-11-02 DIAGNOSIS — F17220 Nicotine dependence, chewing tobacco, uncomplicated: Secondary | ICD-10-CM | POA: Diagnosis not present

## 2020-11-02 DIAGNOSIS — M4802 Spinal stenosis, cervical region: Secondary | ICD-10-CM | POA: Diagnosis not present

## 2020-11-02 DIAGNOSIS — R52 Pain, unspecified: Secondary | ICD-10-CM

## 2020-11-02 DIAGNOSIS — M2578 Osteophyte, vertebrae: Secondary | ICD-10-CM | POA: Diagnosis not present

## 2020-11-02 DIAGNOSIS — M5033 Other cervical disc degeneration, cervicothoracic region: Secondary | ICD-10-CM | POA: Diagnosis not present

## 2020-11-02 DIAGNOSIS — L989 Disorder of the skin and subcutaneous tissue, unspecified: Secondary | ICD-10-CM | POA: Diagnosis not present

## 2020-11-02 LAB — URINALYSIS, COMPLETE (UACMP) WITH MICROSCOPIC
Bacteria, UA: NONE SEEN
Bilirubin Urine: NEGATIVE
Glucose, UA: 50 mg/dL — AB
Ketones, ur: NEGATIVE mg/dL
Nitrite: POSITIVE — AB
Protein, ur: NEGATIVE mg/dL
Specific Gravity, Urine: 1.012 (ref 1.005–1.030)
pH: 7 (ref 5.0–8.0)

## 2020-11-02 LAB — COMPREHENSIVE METABOLIC PANEL
ALT: 15 U/L (ref 0–44)
AST: 13 U/L — ABNORMAL LOW (ref 15–41)
Albumin: 3.4 g/dL — ABNORMAL LOW (ref 3.5–5.0)
Alkaline Phosphatase: 45 U/L (ref 38–126)
Anion gap: 8 (ref 5–15)
BUN: 14 mg/dL (ref 8–23)
CO2: 31 mmol/L (ref 22–32)
Calcium: 9.2 mg/dL (ref 8.9–10.3)
Chloride: 97 mmol/L — ABNORMAL LOW (ref 98–111)
Creatinine, Ser: 0.67 mg/dL (ref 0.61–1.24)
GFR, Estimated: >60 mL/min (ref >60)
Glucose, Bld: 128 mg/dL — ABNORMAL HIGH (ref 70–99)
Potassium: 4 mmol/L (ref 3.5–5.1)
Sodium: 136 mmol/L (ref 135–145)
Total Bilirubin: 0.6 mg/dL (ref 0.3–1.2)
Total Protein: 6.1 g/dL — ABNORMAL LOW (ref 6.5–8.1)

## 2020-11-02 LAB — CBC WITH DIFFERENTIAL/PLATELET
Abs Immature Granulocytes: 0.34 10*3/uL — ABNORMAL HIGH (ref 0.00–0.07)
Basophils Absolute: 0 10*3/uL (ref 0.0–0.1)
Basophils Relative: 0 %
Eosinophils Absolute: 0 10*3/uL (ref 0.0–0.5)
Eosinophils Relative: 0 %
HCT: 37 % — ABNORMAL LOW (ref 39.0–52.0)
Hemoglobin: 12.4 g/dL — ABNORMAL LOW (ref 13.0–17.0)
Immature Granulocytes: 4 %
Lymphocytes Relative: 17 %
Lymphs Abs: 1.4 10*3/uL (ref 0.7–4.0)
MCH: 31.4 pg (ref 26.0–34.0)
MCHC: 33.5 g/dL (ref 30.0–36.0)
MCV: 93.7 fL (ref 80.0–100.0)
Monocytes Absolute: 0.5 10*3/uL (ref 0.1–1.0)
Monocytes Relative: 6 %
Neutro Abs: 5.8 10*3/uL (ref 1.7–7.7)
Neutrophils Relative %: 73 %
Platelets: 227 10*3/uL (ref 150–400)
RBC: 3.95 MIL/uL — ABNORMAL LOW (ref 4.22–5.81)
RDW: 15.3 % (ref 11.5–15.5)
WBC: 8.1 10*3/uL (ref 4.0–10.5)
nRBC: 0 % (ref 0.0–0.2)

## 2020-11-02 MED ORDER — HYDROMORPHONE HCL 1 MG/ML IJ SOLN
1.0000 mg | Freq: Once | INTRAMUSCULAR | Status: AC
Start: 2020-11-02 — End: 2020-11-02
  Administered 2020-11-02: 1 mg via INTRAVENOUS
  Filled 2020-11-02: qty 1

## 2020-11-02 MED ORDER — HALOPERIDOL 1 MG PO TABS: 1.0000 mg | ORAL_TABLET | ORAL | 2 refills | Status: AC

## 2020-11-02 MED ORDER — HYDROMORPHONE HCL 1 MG/ML IJ SOLN
1.0000 mg | Freq: Once | INTRAMUSCULAR | Status: AC
  Administered 2020-11-02: 1 mg via INTRAVENOUS
  Filled 2020-11-02: qty 1

## 2020-11-02 MED ORDER — MORPHINE SULFATE (CONCENTRATE) 10 MG /0.5 ML PO SOLN
10.0000 mg | ORAL | 0 refills | Status: DC | PRN
  Filled 2020-11-02: qty 60, 5d supply, fill #0

## 2020-11-02 NOTE — ED Triage Notes (Signed)
Patient arrived from home via EMS with c/c of a fall. States was in the bathroom and lost his balance. Pt states he did hit his head, but no LOC. Pt has stage 4 prostate CA with mets to the bones. Pt is palliative care

## 2020-11-02 NOTE — ED Provider Notes (Signed)
[facility] Emergency Department Provider Note  ____________________________________________   Event Date/Time   First MD Initiated Contact with Patient 11/02/20 2118     (approximate)  I have reviewed the triage vital signs and the nursing notes.   HISTORY  Chief Complaint Fall   HPI Robert Short is a 70 y.o. male past medical history of CAD, depression, HTN, HDL, tobacco abuse, chronic back pain and metastatic prostate cancer currently receiving hospice care services who presents EMS from home for assessment after he had a fall in his bathtub.  Patient is able to state how or why he fell or what parts of his body he hit.  He is not oriented and unable provide any additional history although states he is hurting everywhere.  He does have DNR paperwork with exam.  I was able to reach his wife he states he is currently receiving hospice care with opioids and Ativan and that hospice nurses are visiting once or twice a week but he does not have daily care.  He has not had any other recent falls but has had worsening pain.  She states that at this point in his care he would want any reversible things identified infection but otherwise does not want to change his pain regimen.         Past Medical History:  Diagnosis Date   Back pain 10/09/2012   Bone cancer ([place])    CAD (coronary artery disease)    a. 08/2015 PCI: LM nl, LAD 20d, LCX nl, RCA 45m, RPAV 95 (3.0x18 Xience Alpine DES);  b. 04/2016 PCI ([place]): RPL 95 (2.75x8 Promus Premier DES).   Cancer associated pain    Depression    History of echocardiogram    a. 08/2016 Echo: EF 50-55%.   History of kidney stones    Hyperlipidemia    Hypertension    Joint pain    Prostate cancer ([place])    a.  s/p prostatectomy ([facility]);  b. bone mets noted 04/2016.   Right arm pain 01/10/2016   Right foot pain 01/10/2016   Right leg pain 01/10/2016   Tobacco abuse     Patient Active Problem List   Diagnosis Date Noted    Goals of care, counseling/discussion 10/18/2018   Severe major depression, single episode, without psychotic features ([place]) 05/01/2018   Suicidal ideation 04/30/2018   Reaction, adjustment, with anxious, depressed mood 04/30/2018   Chest pain 08/02/2016   Pre-diabetes 07/18/2016   Stable angina ([place]) 04/21/2016   Nausea 01/17/2016   History of ST elevation myocardial infarction (STEMI) 08/16/2015   Neoplasm related pain 06/22/2015   Cancer associated pain 02/04/2015   Sebaceous cyst 01/18/2015   Chronic neck pain 12/08/2014   Cervical spondylosis 12/08/2014   Chronic low back pain 12/08/2014   Lumbar spondylosis 12/08/2014   Chronic shoulder pain 12/08/2014   Prostate cancer metastatic to bone ([place]) 12/08/2014   Cancer-related pain 12/08/2014   Neurogenic pain 12/08/2014   Musculoskeletal pain 12/08/2014   Chronic pain 12/07/2014   Long term current use of opiate analgesic 12/07/2014   Long term prescription opiate use 12/07/2014   Opiate use 12/07/2014   Encounter for therapeutic drug level monitoring 12/07/2014   Encounter for chronic pain management 12/07/2014   Therapeutic opioid-induced constipation (OIC) 12/07/2014   Fatigue 08/27/2014   CFIDS (chronic fatigue and immune dysfunction syndrome) ([place]) 06/04/2014   Cervical spinal stenosis 06/04/2014   Clinical depression 06/04/2014   Essential (primary) hypertension 06/04/2014   HLD (  hyperlipidemia) 06/04/2014   Amnesia 06/04/2014   Chronic cervical pain 06/04/2014   Neuropathy ([place]) 06/04/2014   Loss of feeling or sensation 06/04/2014   Pain in shoulder 06/04/2014   Compulsive tobacco user syndrome 06/04/2014   CAD (coronary artery disease) 10/09/2012   Smoker 10/09/2012   ED (erectile dysfunction) of organic origin 08/08/2012   Acid reflux 08/08/2012   Lower urinary tract symptoms 08/08/2012   Restless leg 08/01/2010   Back ache 08/01/2010    Past Surgical History:  Procedure Laterality Date   [place] N/A 08/16/2015   Procedure: Left Heart Cath and Coronary Angiography;  Surgeon: Yolonda Kida, MD;  Location: [place] CV LAB;  Service: Cardiovascular;  Laterality: N/A;   CARDIAC CATHETERIZATION N/A 08/16/2015   Procedure: Coronary Stent Intervention;  Surgeon: Yolonda Kida, MD;  Location: [place] CV LAB;  Service: Cardiovascular;  Laterality: N/A;   CERVIX SURGERY     ESOPHAGOGASTRODUODENOSCOPY N/A 06/25/2020   Procedure: ESOPHAGOGASTRODUODENOSCOPY (EGD);  Surgeon: Lesly Rubenstein, MD;  Location: [facility] ENDOSCOPY;  Service: Endoscopy;  Laterality: N/A;   PROSTATECTOMY     SPINE SURGERY     TONSILLECTOMY     WRIST SURGERY      Prior to Admission medications   Medication Sig Start Date End Date Taking? Authorizing Provider  acetaminophen (TYLENOL) 500 MG tablet Take 1,000 mg by mouth every 8 (eight) hours as needed.  12/03/12   [provider]  albuterol (PROVENTIL HFA;VENTOLIN HFA) 108 (90 Base) MCG/ACT inhaler Inhale 1-2 puffs into the lungs every 6 (six) hours as needed for wheezing or shortness of breath. 08/10/16   Mikey College, NP  aspirin 81 MG tablet Take 81 mg by mouth daily.    [provider]  atorvastatin (LIPITOR) 80 MG tablet Take 1 tablet (80 mg total) by mouth daily at 6 PM. Patient not taking: No sig reported 01/15/19   Loel Dubonnet, NP  clopidogrel (PLAVIX) 75 MG tablet Take 1 tablet (75 mg total) by mouth daily. Patient not taking: No sig reported 01/15/19   Loel Dubonnet, NP  dexamethasone (DECADRON) 4 MG tablet Take 1 tablet (4 mg total) by mouth 2 (two) times daily. 07/20/20   Borders, Kirt Boys, NP  fentaNYL (DURAGESIC) 100 MCG/HR Put on one patch [100 mcg] every 48 hours [2 days;  along with 150 mcg patch]. 10/20/20   Borders, Kirt Boys, NP  fentaNYL (DURAGESIC) 50 MCG/HR Put on one patch [50 mcg] every 48 hours [2 days;  along with 100 mcg patch]. 10/20/20   Borders,  Kirt Boys, NP  fluconazole (DIFLUCAN) 100 MG tablet TAKE ONE TABLET BY MOUTH DAILY 09/28/20   Borders, Kirt Boys, NP  gabapentin (NEURONTIN) 300 MG capsule TAKE 1 CAPSULE BY MOUTH 3 TIMES DAILY 08/16/20 08/16/21  Borders, Kirt Boys, NP  haloperidol (HALDOL) 1 MG tablet Take 1 tablet (1 mg total) by mouth every 6 (six) hours as needed for agitation (nausea). 09/02/20   Borders, Kirt Boys, NP  haloperidol (HALDOL) 1 MG tablet 1 tablet (1 mg total). 11/02/20   Borders, Kirt Boys, NP  ipratropium-albuterol (DUONEB) 0.5-2.5 (3) MG/3ML SOLN Take 3 mLs by nebulization every 6 (six) hours as needed for shortness of breath or wheezing. 06/11/20   Borders, Kirt Boys, NP  LORazepam (ATIVAN) 0.5 MG tablet Take 1 tablet (0.5 mg total) by mouth every 4 (four) hours as needed. 10/12/20     magic mouthwash (  multi-ingredient) oral suspension Swish and swallow 5 to 10 mL by mouth four times daily as needed 07/09/20     magic mouthwash SOLN Take 5 mLs by mouth 4 (four) times daily. 10/03/19   [name], MD  metoprolol tartrate (LOPRESSOR) 25 MG tablet TAKE 1 TABLET (25 MG TOTAL) BY MOUTH 2 (TWO) TIMES DAILY. 09/09/20 09/09/21  [name], MD  Morphine Sulfate (MORPHINE CONCENTRATE) 10 mg / 0.5 ml concentrated solution Take 0.5-1 mLs (10-20 mg total) by mouth every 2 (two) hours as needed for severe pain. 11/02/20   Borders, Kirt Boys, NP  naloxone [facility]) nasal spray 4 mg/0.1 mL 1 spray into nostril upon signs of opioid overdose. Call 911. May repeat once if no response within 2-3 minutes. 05/07/18   Borders, Kirt Boys, NP  nitroGLYCERIN (NITROSTAT) 0.4 MG SL tablet Place 1 tablet (0.4 mg total) under the tongue every 5 (five) minutes as needed. Patient not taking: No sig reported 01/15/19   Loel Dubonnet, NP  nystatin (MYCOSTATIN) 100000 UNIT/ML suspension TAKE 5 ML BY MOUTH 3 (THREE) TIMES DAILY. 05/14/20 05/14/21  Jacquelin Hawking, NP  ondansetron (ZOFRAN) 8 MG tablet Take 1 tablet (8 mg total) by mouth every 8  (eight) hours as needed for nausea or vomiting. 07/09/20   [name], MD  polyethylene glycol (MIRALAX / GLYCOLAX) packet Take 17 g by mouth daily as needed for moderate constipation.     [provider]  promethazine (PHENERGAN) 25 MG tablet TAKE 1/2 TO 1 TABLET (12.5-25 MG TOTAL) BY MOUTH EVERY 8 (EIGHT) HOURS AS NEEDED FOR NAUSEA OR VOMITING. 09/29/20   [name], MD  traZODone (DESYREL) 50 MG tablet TAKE 1 TO 2 TABLETS BY MOUTH AT BEDTIME AS NEEDED FOR SLEEP. 03/24/20 03/24/21  [name], MD    Allergies Ditropan [oxybutynin]  Family History  Problem Relation Age of Onset   Heart attack Mother    Hypertension Mother    Heart attack Father     Social History Social History   Tobacco Use   Smoking status: Former    Packs/day: 0.50    Years: 40.00    Pack years: 20.00    Types: Cigarettes   Smokeless tobacco: Current    Types: Chew   Tobacco comments:    1 pack every couple days   Vaping Use   Vaping Use: Never used  Substance Use Topics   Alcohol use: Yes    Comment: occasionally   Drug use: No    Review of Systems  Review of Systems  Unable to perform ROS: Mental status change  Musculoskeletal:  Positive for myalgias.     ____________________________________________   PHYSICAL EXAM:  VITAL SIGNS: ED Triage Vitals  Enc Vitals Group     BP 11/02/20 2130 (!) 166/102     Pulse Rate 11/02/20 2130 70     Resp 11/02/20 2130 (!) 22     Temp 11/02/20 2130 97.6 F (36.4 C)     Temp Source 11/02/20 2130 Oral     SpO2 11/02/20 2121 97 %     Weight 11/02/20 2131 175 lb (79.4 kg)     Height 11/02/20 2131 5\' 11"  (1.803 m)     Head Circumference --      Peak Flow --      Pain Score --      Pain Loc --      Pain Edu? --      Excl. in [place]? --  Vitals:   11/02/20 2200 11/02/20 2323  BP: (!) 179/104 (!) 216/96  Pulse: 67 62  Resp: (!) 21 (!) 21  Temp:    SpO2: 97% 97%   Physical Exam Vitals and nursing note reviewed.   Constitutional:      Appearance: He is well-developed. He is ill-appearing.  HENT:     Head: Normocephalic.     Right Ear: External ear normal.     Left Ear: External ear normal.     Nose: Nose normal.  Eyes:     Conjunctiva/sclera: Conjunctivae normal.  Cardiovascular:     Rate and Rhythm: Normal rate and regular rhythm.     Heart sounds: No murmur heard. Pulmonary:     Effort: Pulmonary effort is normal. No respiratory distress.     Breath sounds: Normal breath sounds.  Abdominal:     Palpations: Abdomen is soft.     Tenderness: There is no abdominal tenderness.  Musculoskeletal:     Cervical back: Neck supple.  Skin:    General: Skin is warm and dry.  Neurological:     Mental Status: He is alert. He is disoriented.    Patient has scattered ecchymosis and skin tears over his chest, abdomen, bilateral upper extremities as well as some tenderness along his C/T and L-spine. ____________________________________________   LABS (all labs ordered are listed, but only abnormal results are displayed)  Labs Reviewed  URINALYSIS, COMPLETE (UACMP) WITH MICROSCOPIC  CBC WITH DIFFERENTIAL/PLATELET  COMPREHENSIVE METABOLIC PANEL   ____________________________________________  EKG  ECG remarkable for sinus bradycardia with ventricular rate of 53 with otherwise unremarkable intervals and no clear evidence of acute ischemia or other significant arrhythmia. ____________________________________________  RADIOLOGY  ED MD interpretation:    CT head without evidence of skull fracture, intracranial hemorrhage, ischemia or other clear acute process.  There are multiple sclerotic metastatic lesions noted stable from prior exam.  There are some chronic opacification of left mastoid air cells.  CT C-spine shows no acute fracture or traumatic subluxation.  There are some metastatic lesions noted in previous fusion.  Chest x-ray shows no rib fractures, pneumothorax, focal consolidation  suggestive of pneumonia, overt edema or other clear acute process.  There are multiple lytic lesions concerning for metastatic disease.  Plain films of the T and L-spine showed no acute fracture dislocation but showed widespread degenerative changes and metastatic disease.  Plain film the pelvis shows no acute fracture dislocation.  Official radiology report(s): DG Thoracic Spine 2 View  Result Date: 11/02/2020 CLINICAL DATA:  Fall EXAM: THORACIC SPINE 2 VIEWS COMPARISON:  None. FINDINGS: Normal thoracic kyphosis. No acute fracture or listhesis of the thoracic spine. Vertebral body height has been preserved. Endplate remodeling and flowing osteophytes are seen involving the mid and lower thoracic spine in keeping with changes of degenerative disc disease. Widespread osseous sclerotic metastases are again identified. The paraspinal soft tissues are unremarkable. IMPRESSION: No acute fracture or listhesis. Degenerative disc disease of the mid and lower thoracic spine. Widespread osseous sclerotic metastases grade Electronically Signed   By: Fidela Salisbury M.D.   On: 11/02/2020 22:52   DG Lumbar Spine Complete  Result Date: 11/02/2020 CLINICAL DATA:  Fall EXAM: LUMBAR SPINE - COMPLETE 4+ VIEW COMPARISON:  None. FINDINGS: Normal lumbar lordosis. No acute fracture or listhesis of the lumbar spine. Osseous structures are diffusely sclerotic in keeping with widespread osseous metastatic disease. Vertebral body heights are preserved. Intervertebral disc space narrowing at L3-4 is in keeping with changes of moderate degenerative disc  disease. Milder degenerative changes are noted at T11-L1 and L5-S1. Advanced atherosclerotic calcification noted within the aortoiliac vasculature. IMPRESSION: No acute fracture or listhesis. Widespread osseous metastatic disease. Mild to moderate degenerative disc disease as outlined above. Electronically Signed   By: Fidela Salisbury M.D.   On: 11/02/2020 22:49   CT HEAD WO  CONTRAST (5MM)  Result Date: 11/02/2020 CLINICAL DATA:  Lost balance in the bathroom leading to fall at home. Struck head. No loss of consciousness. EXAM: CT HEAD WITHOUT CONTRAST TECHNIQUE: Contiguous axial images were obtained from the base of the skull through the vertex without intravenous contrast. COMPARISON:  Head CT 01/30/2020 FINDINGS: Brain: Normal for age atrophy no intracranial hemorrhage, mass effect, or midline shift. No hydrocephalus. The basilar cisterns are patent. No evidence of territorial infarct or acute ischemia. No extra-axial or intracranial fluid collection. Vascular: Atherosclerosis of skullbase vasculature without hyperdense vessel or abnormal calcification. Skull: No skull fracture. Multiple physeal sclerotic metastasis, grossly stable in CT appearance from prior exam. No extraosseous soft tissue component. Sinuses/Orbits: Partial opacification of lower mastoid air cells on the left, chronic but slightly increased from prior exam. Paranasal sinuses are well aerated. Other: None. IMPRESSION: 1. No acute intracranial abnormality. No skull fracture. 2. Multiple sclerotic calvarial metastasis, grossly stable in CT appearance from prior exam. 3. Partial opacification of lower mastoid air cells on the left, chronic but slightly increased from prior exam. Electronically Signed   By: Keith Rake M.D.   On: 11/02/2020 22:30   CT Cervical Spine Wo Contrast  Result Date: 11/02/2020 CLINICAL DATA:  Neck trauma (Age >= 65y) Lost balance in the bathroom at home leading to fall.  Struck head. EXAM: CT CERVICAL SPINE WITHOUT CONTRAST TECHNIQUE: Multidetector CT imaging of the cervical spine was performed without intravenous contrast. Multiplanar CT image reconstructions were also generated. COMPARISON:  None. FINDINGS: Alignment: Straightening of normal lordosis. No traumatic subluxation. Skull base and vertebrae: No acute fracture. The dens and skull base are intact. Anterior fusion hardware  at C3-C4. Diffuse blastic osseous metastatic disease with heterogeneous sclerosis throughout the visualized skeleton. Soft tissues and spinal canal: No prevertebral fluid or swelling. No visible canal hematoma. Disc levels: There is a posterior disc osteophyte complex at fused C3-C4 level causing mass effect on the spinal canal. Near complete disc space loss at C4-C5 and C5-C6. Additional degenerative disc disease at C6-C7 and C7-T1. Upper chest: No acute or unexpected findings. Other: None. IMPRESSION: 1. No acute fracture or traumatic subluxation of the cervical spine. 2. Diffuse blastic osseous metastatic disease throughout the visualized skeleton. 3. Anterior fusion hardware at C3-C4. Posterior disc osteophyte complex at fused C3-C4 level causing mass effect on the spinal canal. Electronically Signed   By: Keith Rake M.D.   On: 11/02/2020 22:34   DG Pelvis Portable  Result Date: 11/02/2020 CLINICAL DATA:  Fall, pelvic EXAM: PORTABLE PELVIS 1-2 VIEWS COMPARISON:  None. FINDINGS: Normal alignment. No acute fracture or dislocation. Joint spaces are preserved. Diffuse sclerotic metastases are seen throughout the pelvis and proximal femurs bilaterally IMPRESSION: No acute fracture or dislocation. Electronically Signed   By: Fidela Salisbury M.D.   On: 11/02/2020 22:48   DG Chest Portable 1 View  Result Date: 11/02/2020 CLINICAL DATA:  Fall, prostate cancer EXAM: PORTABLE CHEST 1 VIEW COMPARISON:  01/30/2020 FINDINGS: Mild elevation of the right hemidiaphragm has developed since prior examination. No superimposed confluent pulmonary infiltrate. No pneumothorax or pleural effusion. Expansile lytic lesions are seen within the right anterior second, third, and  fourth ribs, new since prior examination, suspicious for metastatic disease, though their lytic nature would be unusual for prostate cancer. This can be seen the setting of de differentiated malignancy, or may reflect alternative etiologies such as  brown tumor of hyperparathyroidism. Cardiac size within normal limits. Mild widening of the superior mediastinum likely relates to rotation on this examination. IMPRESSION: No radiographic evidence of acute cardiopulmonary disease. Elevation of the right hemidiaphragm. Interval development of multiple expansile lytic lesions within the anterior right ribs, as described above. This may be better assessed with dedicated CT imaging if indicated. Electronically Signed   By: Fidela Salisbury M.D.   On: 11/02/2020 22:46    ____________________________________________   PROCEDURES  Procedure(s) performed (including Critical Care):  Procedures   ____________________________________________   INITIAL IMPRESSION / ASSESSMENT AND PLAN / ED COURSE        Patient presents with above-stated history exam for assessment after a fall that occurred at home.  He is receiving hospice care for metastatic prostate cancer.  He is unable to recall circumstances of the fall but is complaining of pain everywhere.  He is not able to localize it and is not oriented.  On arrival he is hypertensive with BP of 166/102 slightly tachypneic with otherwise stable vital signs on room air.  He does have evidence of scattered bruising over his extremities chest abdomen and back but otherwise is able to give examiner thumbs up with both hands and move his toes on command.  He seems to be diffusely tender to matter where I touch him.  Differential includes possible fracture, intracranial hemorrhage and diffuse contusions and skin tears.  Differential for fall includes mechanical versus syncopal versus seizure given metastatic cancer.  On arrival had extensive discussion with patient and wife history of decision-maker regarding goals of care and seems that patient's wife would prefer if that we obtain basic diagnostic studies to see if he is any large fractures or bleeding in his brain or any significant metabolic derangements or  kidney function abnormalities that could potentially be reversed.  Patient wife notes that he would prefer to be in the hospital if he is a reverse but otherwise if not likely would not be able to be in the hospital.  We will undertake limited studies emergency room including CT head and C-spine as well as plain films of the chest pelvis and back patient seems to have the most discomfort and pain basic labs and urinalysis which patient states there is within his goals of care.  Also treat his pain aggressively at this time.  CT head without evidence of skull fracture, intracranial hemorrhage, ischemia or other clear acute process.  There are multiple sclerotic metastatic lesions noted stable from prior exam.  There are some chronic opacification of left mastoid air cells.  CT C-spine shows no acute fracture or traumatic subluxation.  There are some metastatic lesions noted in previous fusion.  Chest x-ray shows no rib fractures, pneumothorax, focal consolidation suggestive of pneumonia, overt edema or other clear acute process.  There are multiple lytic lesions concerning for metastatic disease.  Plain films of the T and L-spine showed no acute fracture dislocation but showed widespread degenerative changes and metastatic disease.  Plain film the pelvis shows no acute fracture dislocation.   Care patient signed over to assuming fight approximate 2300.  Plan is to follow-up labs and urine and if these are unremarkable reassess pain and speak with wife.  It is certainly reasonable for patient to be  admitted for pain control or if patient's wife feels that he cannot safely take care of patient at home as he does not have 24-hour nursing care wife she does feel safe and his pain is better controlled discharge is reasonable.  ____________________________________________   FINAL CLINICAL IMPRESSION(S) / ED DIAGNOSES  Final diagnoses:  Metastatic malignant neoplasm, unspecified site [facility])  Fall,  initial encounter  Multiple skin tears  Diffuse pain  Goals of care, counseling/discussion    Medications  HYDROmorphone (DILAUDID) injection 1 mg (has no administration in time range)  HYDROmorphone (DILAUDID) injection 1 mg (1 mg Intravenous Given 11/02/20 2311)     ED Discharge Orders     None        Note:  This document was prepared using Dragon voice recognition software and may include unintentional dictation errors.    Lucrezia Starch, MD 11/02/20 [phone]

## 2020-11-02 NOTE — ED Notes (Signed)
Patient to CT.

## 2020-11-02 NOTE — Progress Notes (Signed)
Hospice nurse, Amy, requested refill of morphine elixir.  Pain is reportedly improved but patient is taking it more frequently than every 4 hours.  We will liberalize frequency to every 2 hours as needed.

## 2020-11-03 ENCOUNTER — Other Ambulatory Visit: Payer: Self-pay

## 2020-11-03 DIAGNOSIS — Z6820 Body mass index (BMI) 20.0-20.9, adult: Secondary | ICD-10-CM | POA: Diagnosis not present

## 2020-11-03 DIAGNOSIS — I2510 Atherosclerotic heart disease of native coronary artery without angina pectoris: Secondary | ICD-10-CM | POA: Diagnosis not present

## 2020-11-03 DIAGNOSIS — S41112A Laceration without foreign body of left upper arm, initial encounter: Secondary | ICD-10-CM | POA: Diagnosis not present

## 2020-11-03 DIAGNOSIS — I10 Essential (primary) hypertension: Secondary | ICD-10-CM | POA: Diagnosis not present

## 2020-11-03 DIAGNOSIS — R410 Disorientation, unspecified: Secondary | ICD-10-CM | POA: Diagnosis not present

## 2020-11-03 DIAGNOSIS — Z85830 Personal history of malignant neoplasm of bone: Secondary | ICD-10-CM | POA: Diagnosis not present

## 2020-11-03 DIAGNOSIS — R220 Localized swelling, mass and lump, head: Secondary | ICD-10-CM | POA: Diagnosis not present

## 2020-11-03 DIAGNOSIS — Z741 Need for assistance with personal care: Secondary | ICD-10-CM | POA: Diagnosis not present

## 2020-11-03 DIAGNOSIS — S41111A Laceration without foreign body of right upper arm, initial encounter: Secondary | ICD-10-CM | POA: Diagnosis not present

## 2020-11-03 DIAGNOSIS — C61 Malignant neoplasm of prostate: Secondary | ICD-10-CM | POA: Diagnosis not present

## 2020-11-03 DIAGNOSIS — Z87891 Personal history of nicotine dependence: Secondary | ICD-10-CM | POA: Diagnosis not present

## 2020-11-03 DIAGNOSIS — Z9079 Acquired absence of other genital organ(s): Secondary | ICD-10-CM | POA: Diagnosis not present

## 2020-11-03 DIAGNOSIS — F17220 Nicotine dependence, chewing tobacco, uncomplicated: Secondary | ICD-10-CM | POA: Diagnosis not present

## 2020-11-03 DIAGNOSIS — S31119A Laceration without foreign body of abdominal wall, unspecified quadrant without penetration into peritoneal cavity, initial encounter: Secondary | ICD-10-CM | POA: Diagnosis not present

## 2020-11-03 DIAGNOSIS — R5382 Chronic fatigue, unspecified: Secondary | ICD-10-CM | POA: Diagnosis not present

## 2020-11-03 DIAGNOSIS — M4802 Spinal stenosis, cervical region: Secondary | ICD-10-CM | POA: Diagnosis not present

## 2020-11-03 DIAGNOSIS — S21119A Laceration without foreign body of unspecified front wall of thorax without penetration into thoracic cavity, initial encounter: Secondary | ICD-10-CM | POA: Diagnosis not present

## 2020-11-03 MED ORDER — OXYCODONE HCL 5 MG PO TABS
5.0000 mg | ORAL_TABLET | Freq: Once | ORAL | Status: AC
  Administered 2020-11-03: 5 mg via ORAL
  Filled 2020-11-03: qty 1

## 2020-11-03 MED ORDER — CEFDINIR 300 MG PO CAPS
300.0000 mg | ORAL_CAPSULE | Freq: Two times a day (BID) | ORAL | Status: DC
  Administered 2020-11-03: 300 mg via ORAL
  Filled 2020-11-03 (×2): qty 1

## 2020-11-03 MED ORDER — CEFDINIR 300 MG PO CAPS
300.0000 mg | ORAL_CAPSULE | Freq: Two times a day (BID) | ORAL | 0 refills | Status: AC
  Filled 2020-11-03: qty 14, 7d supply, fill #0

## 2020-11-03 MED ORDER — LORAZEPAM 0.5 MG PO TABS
ORAL_TABLET | ORAL | 0 refills | Status: DC
  Filled 2020-11-03: qty 84, 14d supply, fill #0

## 2020-11-03 NOTE — ED Notes (Signed)
O2/2L via nasal cannula applied to patient per request after receiving dilaudid

## 2020-11-03 NOTE — ED Notes (Signed)
MD at the bedside. Patient provided a sandwich and cola. Wife at the bedside

## 2020-11-03 NOTE — ED Provider Notes (Signed)
Procedures     ----------------------------------------- 2:34 AM on 11/03/2020 ----------------------------------------- Lab reassuring. UA suggets nitrite positive UTI. Will send cx.  Pt ambulatory at baseline with assistive device. Chronic pain controlled to baseline. Offered admission, pt prefers to be discharged home and f/u with his healthcare team     Carrie Mew, MD 11/03/20 [phone]

## 2020-11-04 ENCOUNTER — Telehealth: Payer: Self-pay | Admitting: *Deleted

## 2020-11-04 DIAGNOSIS — R5382 Chronic fatigue, unspecified: Secondary | ICD-10-CM | POA: Diagnosis not present

## 2020-11-04 DIAGNOSIS — Z87891 Personal history of nicotine dependence: Secondary | ICD-10-CM | POA: Diagnosis not present

## 2020-11-04 DIAGNOSIS — F17220 Nicotine dependence, chewing tobacco, uncomplicated: Secondary | ICD-10-CM | POA: Diagnosis not present

## 2020-11-04 DIAGNOSIS — R220 Localized swelling, mass and lump, head: Secondary | ICD-10-CM | POA: Diagnosis not present

## 2020-11-04 DIAGNOSIS — Z741 Need for assistance with personal care: Secondary | ICD-10-CM | POA: Diagnosis not present

## 2020-11-04 DIAGNOSIS — Z6820 Body mass index (BMI) 20.0-20.9, adult: Secondary | ICD-10-CM | POA: Diagnosis not present

## 2020-11-04 DIAGNOSIS — Z9079 Acquired absence of other genital organ(s): Secondary | ICD-10-CM | POA: Diagnosis not present

## 2020-11-04 DIAGNOSIS — M4802 Spinal stenosis, cervical region: Secondary | ICD-10-CM | POA: Diagnosis not present

## 2020-11-04 DIAGNOSIS — C61 Malignant neoplasm of prostate: Secondary | ICD-10-CM | POA: Diagnosis not present

## 2020-11-04 NOTE — Telephone Encounter (Signed)
Incoming call received from [place]. Doroteo Bradford took a Arts administrator from the [employer] office. They wanted to confirm that Dr. B wrote the letter re: pt's court. He is not able to attend due to decline in health. Her call back # was [phone].  I returned Sherry's call, but had to leave a vm that call was being returned.

## 2020-11-05 DIAGNOSIS — C61 Malignant neoplasm of prostate: Secondary | ICD-10-CM | POA: Diagnosis not present

## 2020-11-05 DIAGNOSIS — Z9079 Acquired absence of other genital organ(s): Secondary | ICD-10-CM | POA: Diagnosis not present

## 2020-11-05 DIAGNOSIS — Z6820 Body mass index (BMI) 20.0-20.9, adult: Secondary | ICD-10-CM | POA: Diagnosis not present

## 2020-11-05 DIAGNOSIS — F17220 Nicotine dependence, chewing tobacco, uncomplicated: Secondary | ICD-10-CM | POA: Diagnosis not present

## 2020-11-05 DIAGNOSIS — Z741 Need for assistance with personal care: Secondary | ICD-10-CM | POA: Diagnosis not present

## 2020-11-05 DIAGNOSIS — R220 Localized swelling, mass and lump, head: Secondary | ICD-10-CM | POA: Diagnosis not present

## 2020-11-05 DIAGNOSIS — M4802 Spinal stenosis, cervical region: Secondary | ICD-10-CM | POA: Diagnosis not present

## 2020-11-05 DIAGNOSIS — R5382 Chronic fatigue, unspecified: Secondary | ICD-10-CM | POA: Diagnosis not present

## 2020-11-05 DIAGNOSIS — Z87891 Personal history of nicotine dependence: Secondary | ICD-10-CM | POA: Diagnosis not present

## 2020-11-05 LAB — URINE CULTURE: Culture: >=100000 — AB

## 2020-11-05 NOTE — Telephone Encounter (Signed)
Spoke with Cleotis Lema at the [place] office. The DA office needed the letter written to be faxed directly to [facility] at [phone]. Pt had sent a screen shot via email of the letter, but part of the letter was cut off and the DA could not read this letter.  Fax confirmation [email or web address].

## 2020-11-06 DIAGNOSIS — C61 Malignant neoplasm of prostate: Secondary | ICD-10-CM | POA: Diagnosis not present

## 2020-11-06 DIAGNOSIS — Z6820 Body mass index (BMI) 20.0-20.9, adult: Secondary | ICD-10-CM | POA: Diagnosis not present

## 2020-11-06 DIAGNOSIS — M4802 Spinal stenosis, cervical region: Secondary | ICD-10-CM | POA: Diagnosis not present

## 2020-11-06 DIAGNOSIS — F17220 Nicotine dependence, chewing tobacco, uncomplicated: Secondary | ICD-10-CM | POA: Diagnosis not present

## 2020-11-06 DIAGNOSIS — R5382 Chronic fatigue, unspecified: Secondary | ICD-10-CM | POA: Diagnosis not present

## 2020-11-06 DIAGNOSIS — Z87891 Personal history of nicotine dependence: Secondary | ICD-10-CM | POA: Diagnosis not present

## 2020-11-06 DIAGNOSIS — Z9079 Acquired absence of other genital organ(s): Secondary | ICD-10-CM | POA: Diagnosis not present

## 2020-11-06 DIAGNOSIS — Z741 Need for assistance with personal care: Secondary | ICD-10-CM | POA: Diagnosis not present

## 2020-11-06 DIAGNOSIS — R220 Localized swelling, mass and lump, head: Secondary | ICD-10-CM | POA: Diagnosis not present

## 2020-11-07 DIAGNOSIS — M4802 Spinal stenosis, cervical region: Secondary | ICD-10-CM | POA: Diagnosis not present

## 2020-11-07 DIAGNOSIS — Z741 Need for assistance with personal care: Secondary | ICD-10-CM | POA: Diagnosis not present

## 2020-11-07 DIAGNOSIS — Z87891 Personal history of nicotine dependence: Secondary | ICD-10-CM | POA: Diagnosis not present

## 2020-11-07 DIAGNOSIS — F17220 Nicotine dependence, chewing tobacco, uncomplicated: Secondary | ICD-10-CM | POA: Diagnosis not present

## 2020-11-07 DIAGNOSIS — C61 Malignant neoplasm of prostate: Secondary | ICD-10-CM | POA: Diagnosis not present

## 2020-11-07 DIAGNOSIS — Z9079 Acquired absence of other genital organ(s): Secondary | ICD-10-CM | POA: Diagnosis not present

## 2020-11-07 DIAGNOSIS — R5382 Chronic fatigue, unspecified: Secondary | ICD-10-CM | POA: Diagnosis not present

## 2020-11-07 DIAGNOSIS — R220 Localized swelling, mass and lump, head: Secondary | ICD-10-CM | POA: Diagnosis not present

## 2020-11-07 DIAGNOSIS — Z6820 Body mass index (BMI) 20.0-20.9, adult: Secondary | ICD-10-CM | POA: Diagnosis not present

## 2020-11-08 ENCOUNTER — Other Ambulatory Visit: Payer: Self-pay

## 2020-11-08 DIAGNOSIS — Z87891 Personal history of nicotine dependence: Secondary | ICD-10-CM | POA: Diagnosis not present

## 2020-11-08 DIAGNOSIS — R5382 Chronic fatigue, unspecified: Secondary | ICD-10-CM | POA: Diagnosis not present

## 2020-11-08 DIAGNOSIS — Z741 Need for assistance with personal care: Secondary | ICD-10-CM | POA: Diagnosis not present

## 2020-11-08 DIAGNOSIS — R220 Localized swelling, mass and lump, head: Secondary | ICD-10-CM | POA: Diagnosis not present

## 2020-11-08 DIAGNOSIS — Z6820 Body mass index (BMI) 20.0-20.9, adult: Secondary | ICD-10-CM | POA: Diagnosis not present

## 2020-11-08 DIAGNOSIS — Z9079 Acquired absence of other genital organ(s): Secondary | ICD-10-CM | POA: Diagnosis not present

## 2020-11-08 DIAGNOSIS — M4802 Spinal stenosis, cervical region: Secondary | ICD-10-CM | POA: Diagnosis not present

## 2020-11-08 DIAGNOSIS — C61 Malignant neoplasm of prostate: Secondary | ICD-10-CM | POA: Diagnosis not present

## 2020-11-08 DIAGNOSIS — F17220 Nicotine dependence, chewing tobacco, uncomplicated: Secondary | ICD-10-CM | POA: Diagnosis not present

## 2020-11-09 ENCOUNTER — Other Ambulatory Visit: Payer: Self-pay | Admitting: Hospice and Palliative Medicine

## 2020-11-09 ENCOUNTER — Other Ambulatory Visit: Payer: Self-pay

## 2020-11-09 DIAGNOSIS — R220 Localized swelling, mass and lump, head: Secondary | ICD-10-CM | POA: Diagnosis not present

## 2020-11-09 DIAGNOSIS — R5382 Chronic fatigue, unspecified: Secondary | ICD-10-CM | POA: Diagnosis not present

## 2020-11-09 DIAGNOSIS — Z9079 Acquired absence of other genital organ(s): Secondary | ICD-10-CM | POA: Diagnosis not present

## 2020-11-09 DIAGNOSIS — F17220 Nicotine dependence, chewing tobacco, uncomplicated: Secondary | ICD-10-CM | POA: Diagnosis not present

## 2020-11-09 DIAGNOSIS — M4802 Spinal stenosis, cervical region: Secondary | ICD-10-CM | POA: Diagnosis not present

## 2020-11-09 DIAGNOSIS — Z741 Need for assistance with personal care: Secondary | ICD-10-CM | POA: Diagnosis not present

## 2020-11-09 DIAGNOSIS — Z87891 Personal history of nicotine dependence: Secondary | ICD-10-CM | POA: Diagnosis not present

## 2020-11-09 DIAGNOSIS — C61 Malignant neoplasm of prostate: Secondary | ICD-10-CM | POA: Diagnosis not present

## 2020-11-09 DIAGNOSIS — Z6820 Body mass index (BMI) 20.0-20.9, adult: Secondary | ICD-10-CM | POA: Diagnosis not present

## 2020-11-09 MED ORDER — FENTANYL 100 MCG/HR TD PT72
MEDICATED_PATCH | TRANSDERMAL | 0 refills | Status: DC
  Filled 2020-11-09: qty 10, 20d supply, fill #0

## 2020-11-09 MED ORDER — MORPHINE SULFATE (CONCENTRATE) 10 MG /0.5 ML PO SOLN
10.0000 mg | ORAL | 0 refills | Status: DC | PRN
  Filled 2020-11-09: qty 60, 5d supply, fill #0

## 2020-11-09 MED ORDER — FENTANYL 50 MCG/HR TD PT72
MEDICATED_PATCH | TRANSDERMAL | 0 refills | Status: DC
  Filled 2020-11-09: qty 10, 20d supply, fill #0

## 2020-11-09 NOTE — Progress Notes (Signed)
Hospice requested refill of fentanyl/morphine. Rx sent to pharmacy. Pain is reportedly stable on current regimen.

## 2020-11-10 DIAGNOSIS — Z6820 Body mass index (BMI) 20.0-20.9, adult: Secondary | ICD-10-CM | POA: Diagnosis not present

## 2020-11-10 DIAGNOSIS — R220 Localized swelling, mass and lump, head: Secondary | ICD-10-CM | POA: Diagnosis not present

## 2020-11-10 DIAGNOSIS — Z741 Need for assistance with personal care: Secondary | ICD-10-CM | POA: Diagnosis not present

## 2020-11-10 DIAGNOSIS — Z9079 Acquired absence of other genital organ(s): Secondary | ICD-10-CM | POA: Diagnosis not present

## 2020-11-10 DIAGNOSIS — C61 Malignant neoplasm of prostate: Secondary | ICD-10-CM | POA: Diagnosis not present

## 2020-11-10 DIAGNOSIS — M4802 Spinal stenosis, cervical region: Secondary | ICD-10-CM | POA: Diagnosis not present

## 2020-11-10 DIAGNOSIS — R5382 Chronic fatigue, unspecified: Secondary | ICD-10-CM | POA: Diagnosis not present

## 2020-11-10 DIAGNOSIS — Z87891 Personal history of nicotine dependence: Secondary | ICD-10-CM | POA: Diagnosis not present

## 2020-11-10 DIAGNOSIS — F17220 Nicotine dependence, chewing tobacco, uncomplicated: Secondary | ICD-10-CM | POA: Diagnosis not present

## 2020-11-11 DIAGNOSIS — R220 Localized swelling, mass and lump, head: Secondary | ICD-10-CM | POA: Diagnosis not present

## 2020-11-11 DIAGNOSIS — Z741 Need for assistance with personal care: Secondary | ICD-10-CM | POA: Diagnosis not present

## 2020-11-11 DIAGNOSIS — Z9079 Acquired absence of other genital organ(s): Secondary | ICD-10-CM | POA: Diagnosis not present

## 2020-11-11 DIAGNOSIS — R5382 Chronic fatigue, unspecified: Secondary | ICD-10-CM | POA: Diagnosis not present

## 2020-11-11 DIAGNOSIS — Z6820 Body mass index (BMI) 20.0-20.9, adult: Secondary | ICD-10-CM | POA: Diagnosis not present

## 2020-11-11 DIAGNOSIS — C61 Malignant neoplasm of prostate: Secondary | ICD-10-CM | POA: Diagnosis not present

## 2020-11-11 DIAGNOSIS — Z87891 Personal history of nicotine dependence: Secondary | ICD-10-CM | POA: Diagnosis not present

## 2020-11-11 DIAGNOSIS — M4802 Spinal stenosis, cervical region: Secondary | ICD-10-CM | POA: Diagnosis not present

## 2020-11-11 DIAGNOSIS — F17220 Nicotine dependence, chewing tobacco, uncomplicated: Secondary | ICD-10-CM | POA: Diagnosis not present

## 2020-11-12 DIAGNOSIS — Z87891 Personal history of nicotine dependence: Secondary | ICD-10-CM | POA: Diagnosis not present

## 2020-11-12 DIAGNOSIS — Z9079 Acquired absence of other genital organ(s): Secondary | ICD-10-CM | POA: Diagnosis not present

## 2020-11-12 DIAGNOSIS — Z741 Need for assistance with personal care: Secondary | ICD-10-CM | POA: Diagnosis not present

## 2020-11-12 DIAGNOSIS — F17220 Nicotine dependence, chewing tobacco, uncomplicated: Secondary | ICD-10-CM | POA: Diagnosis not present

## 2020-11-12 DIAGNOSIS — R220 Localized swelling, mass and lump, head: Secondary | ICD-10-CM | POA: Diagnosis not present

## 2020-11-12 DIAGNOSIS — M4802 Spinal stenosis, cervical region: Secondary | ICD-10-CM | POA: Diagnosis not present

## 2020-11-12 DIAGNOSIS — C61 Malignant neoplasm of prostate: Secondary | ICD-10-CM | POA: Diagnosis not present

## 2020-11-12 DIAGNOSIS — R5382 Chronic fatigue, unspecified: Secondary | ICD-10-CM | POA: Diagnosis not present

## 2020-11-12 DIAGNOSIS — Z6820 Body mass index (BMI) 20.0-20.9, adult: Secondary | ICD-10-CM | POA: Diagnosis not present

## 2020-11-13 DIAGNOSIS — F17220 Nicotine dependence, chewing tobacco, uncomplicated: Secondary | ICD-10-CM | POA: Diagnosis not present

## 2020-11-13 DIAGNOSIS — Z6820 Body mass index (BMI) 20.0-20.9, adult: Secondary | ICD-10-CM | POA: Diagnosis not present

## 2020-11-13 DIAGNOSIS — R220 Localized swelling, mass and lump, head: Secondary | ICD-10-CM | POA: Diagnosis not present

## 2020-11-13 DIAGNOSIS — R5382 Chronic fatigue, unspecified: Secondary | ICD-10-CM | POA: Diagnosis not present

## 2020-11-13 DIAGNOSIS — C61 Malignant neoplasm of prostate: Secondary | ICD-10-CM | POA: Diagnosis not present

## 2020-11-13 DIAGNOSIS — Z87891 Personal history of nicotine dependence: Secondary | ICD-10-CM | POA: Diagnosis not present

## 2020-11-13 DIAGNOSIS — Z9079 Acquired absence of other genital organ(s): Secondary | ICD-10-CM | POA: Diagnosis not present

## 2020-11-13 DIAGNOSIS — M4802 Spinal stenosis, cervical region: Secondary | ICD-10-CM | POA: Diagnosis not present

## 2020-11-13 DIAGNOSIS — Z741 Need for assistance with personal care: Secondary | ICD-10-CM | POA: Diagnosis not present

## 2020-11-14 DIAGNOSIS — F17220 Nicotine dependence, chewing tobacco, uncomplicated: Secondary | ICD-10-CM | POA: Diagnosis not present

## 2020-11-14 DIAGNOSIS — Z9079 Acquired absence of other genital organ(s): Secondary | ICD-10-CM | POA: Diagnosis not present

## 2020-11-14 DIAGNOSIS — Z87891 Personal history of nicotine dependence: Secondary | ICD-10-CM | POA: Diagnosis not present

## 2020-11-14 DIAGNOSIS — Z6820 Body mass index (BMI) 20.0-20.9, adult: Secondary | ICD-10-CM | POA: Diagnosis not present

## 2020-11-14 DIAGNOSIS — R5382 Chronic fatigue, unspecified: Secondary | ICD-10-CM | POA: Diagnosis not present

## 2020-11-14 DIAGNOSIS — M4802 Spinal stenosis, cervical region: Secondary | ICD-10-CM | POA: Diagnosis not present

## 2020-11-14 DIAGNOSIS — C61 Malignant neoplasm of prostate: Secondary | ICD-10-CM | POA: Diagnosis not present

## 2020-11-14 DIAGNOSIS — R220 Localized swelling, mass and lump, head: Secondary | ICD-10-CM | POA: Diagnosis not present

## 2020-11-14 DIAGNOSIS — Z741 Need for assistance with personal care: Secondary | ICD-10-CM | POA: Diagnosis not present

## 2020-11-15 DIAGNOSIS — R5382 Chronic fatigue, unspecified: Secondary | ICD-10-CM | POA: Diagnosis not present

## 2020-11-15 DIAGNOSIS — F17220 Nicotine dependence, chewing tobacco, uncomplicated: Secondary | ICD-10-CM | POA: Diagnosis not present

## 2020-11-15 DIAGNOSIS — Z741 Need for assistance with personal care: Secondary | ICD-10-CM | POA: Diagnosis not present

## 2020-11-15 DIAGNOSIS — M4802 Spinal stenosis, cervical region: Secondary | ICD-10-CM | POA: Diagnosis not present

## 2020-11-15 DIAGNOSIS — Z6820 Body mass index (BMI) 20.0-20.9, adult: Secondary | ICD-10-CM | POA: Diagnosis not present

## 2020-11-15 DIAGNOSIS — Z87891 Personal history of nicotine dependence: Secondary | ICD-10-CM | POA: Diagnosis not present

## 2020-11-15 DIAGNOSIS — C61 Malignant neoplasm of prostate: Secondary | ICD-10-CM | POA: Diagnosis not present

## 2020-11-15 DIAGNOSIS — Z9079 Acquired absence of other genital organ(s): Secondary | ICD-10-CM | POA: Diagnosis not present

## 2020-11-15 DIAGNOSIS — R220 Localized swelling, mass and lump, head: Secondary | ICD-10-CM | POA: Diagnosis not present

## 2020-11-16 ENCOUNTER — Other Ambulatory Visit: Payer: Self-pay

## 2020-11-16 DIAGNOSIS — Z87891 Personal history of nicotine dependence: Secondary | ICD-10-CM | POA: Diagnosis not present

## 2020-11-16 DIAGNOSIS — C61 Malignant neoplasm of prostate: Secondary | ICD-10-CM | POA: Diagnosis not present

## 2020-11-16 DIAGNOSIS — R220 Localized swelling, mass and lump, head: Secondary | ICD-10-CM | POA: Diagnosis not present

## 2020-11-16 DIAGNOSIS — R5382 Chronic fatigue, unspecified: Secondary | ICD-10-CM | POA: Diagnosis not present

## 2020-11-16 DIAGNOSIS — Z741 Need for assistance with personal care: Secondary | ICD-10-CM | POA: Diagnosis not present

## 2020-11-16 DIAGNOSIS — F17220 Nicotine dependence, chewing tobacco, uncomplicated: Secondary | ICD-10-CM | POA: Diagnosis not present

## 2020-11-16 DIAGNOSIS — Z6820 Body mass index (BMI) 20.0-20.9, adult: Secondary | ICD-10-CM | POA: Diagnosis not present

## 2020-11-16 DIAGNOSIS — Z9079 Acquired absence of other genital organ(s): Secondary | ICD-10-CM | POA: Diagnosis not present

## 2020-11-16 DIAGNOSIS — M4802 Spinal stenosis, cervical region: Secondary | ICD-10-CM | POA: Diagnosis not present

## 2020-11-17 DIAGNOSIS — R5382 Chronic fatigue, unspecified: Secondary | ICD-10-CM | POA: Diagnosis not present

## 2020-11-17 DIAGNOSIS — R220 Localized swelling, mass and lump, head: Secondary | ICD-10-CM | POA: Diagnosis not present

## 2020-11-17 DIAGNOSIS — F17220 Nicotine dependence, chewing tobacco, uncomplicated: Secondary | ICD-10-CM | POA: Diagnosis not present

## 2020-11-17 DIAGNOSIS — Z741 Need for assistance with personal care: Secondary | ICD-10-CM | POA: Diagnosis not present

## 2020-11-17 DIAGNOSIS — Z9079 Acquired absence of other genital organ(s): Secondary | ICD-10-CM | POA: Diagnosis not present

## 2020-11-17 DIAGNOSIS — M4802 Spinal stenosis, cervical region: Secondary | ICD-10-CM | POA: Diagnosis not present

## 2020-11-17 DIAGNOSIS — Z6820 Body mass index (BMI) 20.0-20.9, adult: Secondary | ICD-10-CM | POA: Diagnosis not present

## 2020-11-17 DIAGNOSIS — C61 Malignant neoplasm of prostate: Secondary | ICD-10-CM | POA: Diagnosis not present

## 2020-11-17 DIAGNOSIS — Z87891 Personal history of nicotine dependence: Secondary | ICD-10-CM | POA: Diagnosis not present

## 2020-11-18 DIAGNOSIS — R220 Localized swelling, mass and lump, head: Secondary | ICD-10-CM | POA: Diagnosis not present

## 2020-11-18 DIAGNOSIS — Z87891 Personal history of nicotine dependence: Secondary | ICD-10-CM | POA: Diagnosis not present

## 2020-11-18 DIAGNOSIS — F17220 Nicotine dependence, chewing tobacco, uncomplicated: Secondary | ICD-10-CM | POA: Diagnosis not present

## 2020-11-18 DIAGNOSIS — Z6820 Body mass index (BMI) 20.0-20.9, adult: Secondary | ICD-10-CM | POA: Diagnosis not present

## 2020-11-18 DIAGNOSIS — C61 Malignant neoplasm of prostate: Secondary | ICD-10-CM | POA: Diagnosis not present

## 2020-11-18 DIAGNOSIS — Z741 Need for assistance with personal care: Secondary | ICD-10-CM | POA: Diagnosis not present

## 2020-11-18 DIAGNOSIS — Z9079 Acquired absence of other genital organ(s): Secondary | ICD-10-CM | POA: Diagnosis not present

## 2020-11-18 DIAGNOSIS — R5382 Chronic fatigue, unspecified: Secondary | ICD-10-CM | POA: Diagnosis not present

## 2020-11-18 DIAGNOSIS — M4802 Spinal stenosis, cervical region: Secondary | ICD-10-CM | POA: Diagnosis not present

## 2020-11-19 DIAGNOSIS — R220 Localized swelling, mass and lump, head: Secondary | ICD-10-CM | POA: Diagnosis not present

## 2020-11-19 DIAGNOSIS — F17220 Nicotine dependence, chewing tobacco, uncomplicated: Secondary | ICD-10-CM | POA: Diagnosis not present

## 2020-11-19 DIAGNOSIS — R5382 Chronic fatigue, unspecified: Secondary | ICD-10-CM | POA: Diagnosis not present

## 2020-11-19 DIAGNOSIS — Z87891 Personal history of nicotine dependence: Secondary | ICD-10-CM | POA: Diagnosis not present

## 2020-11-19 DIAGNOSIS — Z6820 Body mass index (BMI) 20.0-20.9, adult: Secondary | ICD-10-CM | POA: Diagnosis not present

## 2020-11-19 DIAGNOSIS — M4802 Spinal stenosis, cervical region: Secondary | ICD-10-CM | POA: Diagnosis not present

## 2020-11-19 DIAGNOSIS — Z9079 Acquired absence of other genital organ(s): Secondary | ICD-10-CM | POA: Diagnosis not present

## 2020-11-19 DIAGNOSIS — Z741 Need for assistance with personal care: Secondary | ICD-10-CM | POA: Diagnosis not present

## 2020-11-19 DIAGNOSIS — C61 Malignant neoplasm of prostate: Secondary | ICD-10-CM | POA: Diagnosis not present

## 2020-11-20 DIAGNOSIS — Z6820 Body mass index (BMI) 20.0-20.9, adult: Secondary | ICD-10-CM | POA: Diagnosis not present

## 2020-11-20 DIAGNOSIS — R220 Localized swelling, mass and lump, head: Secondary | ICD-10-CM | POA: Diagnosis not present

## 2020-11-20 DIAGNOSIS — Z87891 Personal history of nicotine dependence: Secondary | ICD-10-CM | POA: Diagnosis not present

## 2020-11-20 DIAGNOSIS — Z9079 Acquired absence of other genital organ(s): Secondary | ICD-10-CM | POA: Diagnosis not present

## 2020-11-20 DIAGNOSIS — Z741 Need for assistance with personal care: Secondary | ICD-10-CM | POA: Diagnosis not present

## 2020-11-20 DIAGNOSIS — M4802 Spinal stenosis, cervical region: Secondary | ICD-10-CM | POA: Diagnosis not present

## 2020-11-20 DIAGNOSIS — C61 Malignant neoplasm of prostate: Secondary | ICD-10-CM | POA: Diagnosis not present

## 2020-11-20 DIAGNOSIS — F17220 Nicotine dependence, chewing tobacco, uncomplicated: Secondary | ICD-10-CM | POA: Diagnosis not present

## 2020-11-20 DIAGNOSIS — R5382 Chronic fatigue, unspecified: Secondary | ICD-10-CM | POA: Diagnosis not present

## 2020-11-21 DIAGNOSIS — R220 Localized swelling, mass and lump, head: Secondary | ICD-10-CM | POA: Diagnosis not present

## 2020-11-21 DIAGNOSIS — F17220 Nicotine dependence, chewing tobacco, uncomplicated: Secondary | ICD-10-CM | POA: Diagnosis not present

## 2020-11-21 DIAGNOSIS — M4802 Spinal stenosis, cervical region: Secondary | ICD-10-CM | POA: Diagnosis not present

## 2020-11-21 DIAGNOSIS — Z741 Need for assistance with personal care: Secondary | ICD-10-CM | POA: Diagnosis not present

## 2020-11-21 DIAGNOSIS — C61 Malignant neoplasm of prostate: Secondary | ICD-10-CM | POA: Diagnosis not present

## 2020-11-21 DIAGNOSIS — Z9079 Acquired absence of other genital organ(s): Secondary | ICD-10-CM | POA: Diagnosis not present

## 2020-11-21 DIAGNOSIS — Z6820 Body mass index (BMI) 20.0-20.9, adult: Secondary | ICD-10-CM | POA: Diagnosis not present

## 2020-11-21 DIAGNOSIS — Z87891 Personal history of nicotine dependence: Secondary | ICD-10-CM | POA: Diagnosis not present

## 2020-11-21 DIAGNOSIS — R5382 Chronic fatigue, unspecified: Secondary | ICD-10-CM | POA: Diagnosis not present

## 2020-11-22 DIAGNOSIS — Z6820 Body mass index (BMI) 20.0-20.9, adult: Secondary | ICD-10-CM | POA: Diagnosis not present

## 2020-11-22 DIAGNOSIS — Z87891 Personal history of nicotine dependence: Secondary | ICD-10-CM | POA: Diagnosis not present

## 2020-11-22 DIAGNOSIS — R5382 Chronic fatigue, unspecified: Secondary | ICD-10-CM | POA: Diagnosis not present

## 2020-11-22 DIAGNOSIS — C61 Malignant neoplasm of prostate: Secondary | ICD-10-CM | POA: Diagnosis not present

## 2020-11-22 DIAGNOSIS — Z9079 Acquired absence of other genital organ(s): Secondary | ICD-10-CM | POA: Diagnosis not present

## 2020-11-22 DIAGNOSIS — F17220 Nicotine dependence, chewing tobacco, uncomplicated: Secondary | ICD-10-CM | POA: Diagnosis not present

## 2020-11-22 DIAGNOSIS — Z741 Need for assistance with personal care: Secondary | ICD-10-CM | POA: Diagnosis not present

## 2020-11-22 DIAGNOSIS — R220 Localized swelling, mass and lump, head: Secondary | ICD-10-CM | POA: Diagnosis not present

## 2020-11-22 DIAGNOSIS — M4802 Spinal stenosis, cervical region: Secondary | ICD-10-CM | POA: Diagnosis not present

## 2020-11-23 DIAGNOSIS — M4802 Spinal stenosis, cervical region: Secondary | ICD-10-CM | POA: Diagnosis not present

## 2020-11-23 DIAGNOSIS — C61 Malignant neoplasm of prostate: Secondary | ICD-10-CM | POA: Diagnosis not present

## 2020-11-23 DIAGNOSIS — R220 Localized swelling, mass and lump, head: Secondary | ICD-10-CM | POA: Diagnosis not present

## 2020-11-23 DIAGNOSIS — Z741 Need for assistance with personal care: Secondary | ICD-10-CM | POA: Diagnosis not present

## 2020-11-23 DIAGNOSIS — Z6820 Body mass index (BMI) 20.0-20.9, adult: Secondary | ICD-10-CM | POA: Diagnosis not present

## 2020-11-23 DIAGNOSIS — Z9079 Acquired absence of other genital organ(s): Secondary | ICD-10-CM | POA: Diagnosis not present

## 2020-11-23 DIAGNOSIS — R5382 Chronic fatigue, unspecified: Secondary | ICD-10-CM | POA: Diagnosis not present

## 2020-11-23 DIAGNOSIS — F17220 Nicotine dependence, chewing tobacco, uncomplicated: Secondary | ICD-10-CM | POA: Diagnosis not present

## 2020-11-23 DIAGNOSIS — Z87891 Personal history of nicotine dependence: Secondary | ICD-10-CM | POA: Diagnosis not present

## 2020-11-24 ENCOUNTER — Other Ambulatory Visit: Payer: Self-pay | Admitting: Hospice and Palliative Medicine

## 2020-11-24 ENCOUNTER — Other Ambulatory Visit: Payer: Self-pay

## 2020-11-24 DIAGNOSIS — F17220 Nicotine dependence, chewing tobacco, uncomplicated: Secondary | ICD-10-CM | POA: Diagnosis not present

## 2020-11-24 DIAGNOSIS — M4802 Spinal stenosis, cervical region: Secondary | ICD-10-CM | POA: Diagnosis not present

## 2020-11-24 DIAGNOSIS — Z9079 Acquired absence of other genital organ(s): Secondary | ICD-10-CM | POA: Diagnosis not present

## 2020-11-24 DIAGNOSIS — Z741 Need for assistance with personal care: Secondary | ICD-10-CM | POA: Diagnosis not present

## 2020-11-24 DIAGNOSIS — C61 Malignant neoplasm of prostate: Secondary | ICD-10-CM | POA: Diagnosis not present

## 2020-11-24 DIAGNOSIS — Z6820 Body mass index (BMI) 20.0-20.9, adult: Secondary | ICD-10-CM | POA: Diagnosis not present

## 2020-11-24 DIAGNOSIS — R5382 Chronic fatigue, unspecified: Secondary | ICD-10-CM | POA: Diagnosis not present

## 2020-11-24 DIAGNOSIS — Z87891 Personal history of nicotine dependence: Secondary | ICD-10-CM | POA: Diagnosis not present

## 2020-11-24 DIAGNOSIS — R220 Localized swelling, mass and lump, head: Secondary | ICD-10-CM | POA: Diagnosis not present

## 2020-11-24 MED ORDER — FENTANYL 50 MCG/HR TD PT72
MEDICATED_PATCH | TRANSDERMAL | 0 refills | Status: DC
  Filled 2020-11-24: qty 10, fill #0
  Filled 2020-11-26: qty 10, 20d supply, fill #0

## 2020-11-24 MED ORDER — MORPHINE SULFATE (CONCENTRATE) 10 MG /0.5 ML PO SOLN
10.0000 mg | ORAL | 0 refills | Status: DC | PRN
  Filled 2020-11-24: qty 60, 5d supply, fill #0

## 2020-11-24 MED ORDER — FENTANYL 100 MCG/HR TD PT72
MEDICATED_PATCH | TRANSDERMAL | 0 refills | Status: DC
  Filled 2020-11-24: qty 10, fill #0
  Filled 2020-11-26: qty 10, 20d supply, fill #0

## 2020-11-24 NOTE — Progress Notes (Signed)
Spoke with hospice nurse, Amy.  Reportedly, patient is doing slightly better but not back to baseline.  Hospice is requested refills on fentanyl and morphine prior to the holiday weekend.

## 2020-11-25 DIAGNOSIS — F17220 Nicotine dependence, chewing tobacco, uncomplicated: Secondary | ICD-10-CM | POA: Diagnosis not present

## 2020-11-25 DIAGNOSIS — Z6820 Body mass index (BMI) 20.0-20.9, adult: Secondary | ICD-10-CM | POA: Diagnosis not present

## 2020-11-25 DIAGNOSIS — Z87891 Personal history of nicotine dependence: Secondary | ICD-10-CM | POA: Diagnosis not present

## 2020-11-25 DIAGNOSIS — Z741 Need for assistance with personal care: Secondary | ICD-10-CM | POA: Diagnosis not present

## 2020-11-25 DIAGNOSIS — Z9079 Acquired absence of other genital organ(s): Secondary | ICD-10-CM | POA: Diagnosis not present

## 2020-11-25 DIAGNOSIS — R220 Localized swelling, mass and lump, head: Secondary | ICD-10-CM | POA: Diagnosis not present

## 2020-11-25 DIAGNOSIS — R5382 Chronic fatigue, unspecified: Secondary | ICD-10-CM | POA: Diagnosis not present

## 2020-11-25 DIAGNOSIS — M4802 Spinal stenosis, cervical region: Secondary | ICD-10-CM | POA: Diagnosis not present

## 2020-11-25 DIAGNOSIS — C61 Malignant neoplasm of prostate: Secondary | ICD-10-CM | POA: Diagnosis not present

## 2020-11-26 ENCOUNTER — Other Ambulatory Visit: Payer: Self-pay

## 2020-11-26 DIAGNOSIS — R220 Localized swelling, mass and lump, head: Secondary | ICD-10-CM | POA: Diagnosis not present

## 2020-11-26 DIAGNOSIS — R5382 Chronic fatigue, unspecified: Secondary | ICD-10-CM | POA: Diagnosis not present

## 2020-11-26 DIAGNOSIS — M4802 Spinal stenosis, cervical region: Secondary | ICD-10-CM | POA: Diagnosis not present

## 2020-11-26 DIAGNOSIS — C61 Malignant neoplasm of prostate: Secondary | ICD-10-CM | POA: Diagnosis not present

## 2020-11-26 DIAGNOSIS — F17220 Nicotine dependence, chewing tobacco, uncomplicated: Secondary | ICD-10-CM | POA: Diagnosis not present

## 2020-11-26 DIAGNOSIS — Z87891 Personal history of nicotine dependence: Secondary | ICD-10-CM | POA: Diagnosis not present

## 2020-11-26 DIAGNOSIS — Z6820 Body mass index (BMI) 20.0-20.9, adult: Secondary | ICD-10-CM | POA: Diagnosis not present

## 2020-11-26 DIAGNOSIS — Z9079 Acquired absence of other genital organ(s): Secondary | ICD-10-CM | POA: Diagnosis not present

## 2020-11-26 DIAGNOSIS — Z741 Need for assistance with personal care: Secondary | ICD-10-CM | POA: Diagnosis not present

## 2020-11-27 DIAGNOSIS — Z6820 Body mass index (BMI) 20.0-20.9, adult: Secondary | ICD-10-CM | POA: Diagnosis not present

## 2020-11-27 DIAGNOSIS — Z741 Need for assistance with personal care: Secondary | ICD-10-CM | POA: Diagnosis not present

## 2020-11-27 DIAGNOSIS — R5382 Chronic fatigue, unspecified: Secondary | ICD-10-CM | POA: Diagnosis not present

## 2020-11-27 DIAGNOSIS — R220 Localized swelling, mass and lump, head: Secondary | ICD-10-CM | POA: Diagnosis not present

## 2020-11-27 DIAGNOSIS — C61 Malignant neoplasm of prostate: Secondary | ICD-10-CM | POA: Diagnosis not present

## 2020-11-27 DIAGNOSIS — M4802 Spinal stenosis, cervical region: Secondary | ICD-10-CM | POA: Diagnosis not present

## 2020-11-27 DIAGNOSIS — F17220 Nicotine dependence, chewing tobacco, uncomplicated: Secondary | ICD-10-CM | POA: Diagnosis not present

## 2020-11-27 DIAGNOSIS — Z87891 Personal history of nicotine dependence: Secondary | ICD-10-CM | POA: Diagnosis not present

## 2020-11-27 DIAGNOSIS — Z9079 Acquired absence of other genital organ(s): Secondary | ICD-10-CM | POA: Diagnosis not present

## 2020-11-28 DIAGNOSIS — M4802 Spinal stenosis, cervical region: Secondary | ICD-10-CM | POA: Diagnosis not present

## 2020-11-28 DIAGNOSIS — R220 Localized swelling, mass and lump, head: Secondary | ICD-10-CM | POA: Diagnosis not present

## 2020-11-28 DIAGNOSIS — C61 Malignant neoplasm of prostate: Secondary | ICD-10-CM | POA: Diagnosis not present

## 2020-11-28 DIAGNOSIS — Z9079 Acquired absence of other genital organ(s): Secondary | ICD-10-CM | POA: Diagnosis not present

## 2020-11-28 DIAGNOSIS — Z6820 Body mass index (BMI) 20.0-20.9, adult: Secondary | ICD-10-CM | POA: Diagnosis not present

## 2020-11-28 DIAGNOSIS — F17220 Nicotine dependence, chewing tobacco, uncomplicated: Secondary | ICD-10-CM | POA: Diagnosis not present

## 2020-11-28 DIAGNOSIS — R5382 Chronic fatigue, unspecified: Secondary | ICD-10-CM | POA: Diagnosis not present

## 2020-11-28 DIAGNOSIS — Z741 Need for assistance with personal care: Secondary | ICD-10-CM | POA: Diagnosis not present

## 2020-11-28 DIAGNOSIS — Z87891 Personal history of nicotine dependence: Secondary | ICD-10-CM | POA: Diagnosis not present

## 2020-11-29 DIAGNOSIS — C61 Malignant neoplasm of prostate: Secondary | ICD-10-CM | POA: Diagnosis not present

## 2020-11-29 DIAGNOSIS — Z6820 Body mass index (BMI) 20.0-20.9, adult: Secondary | ICD-10-CM | POA: Diagnosis not present

## 2020-11-29 DIAGNOSIS — Z9079 Acquired absence of other genital organ(s): Secondary | ICD-10-CM | POA: Diagnosis not present

## 2020-11-29 DIAGNOSIS — M4802 Spinal stenosis, cervical region: Secondary | ICD-10-CM | POA: Diagnosis not present

## 2020-11-29 DIAGNOSIS — Z741 Need for assistance with personal care: Secondary | ICD-10-CM | POA: Diagnosis not present

## 2020-11-29 DIAGNOSIS — F17220 Nicotine dependence, chewing tobacco, uncomplicated: Secondary | ICD-10-CM | POA: Diagnosis not present

## 2020-11-29 DIAGNOSIS — R220 Localized swelling, mass and lump, head: Secondary | ICD-10-CM | POA: Diagnosis not present

## 2020-11-29 DIAGNOSIS — Z87891 Personal history of nicotine dependence: Secondary | ICD-10-CM | POA: Diagnosis not present

## 2020-11-29 DIAGNOSIS — R5382 Chronic fatigue, unspecified: Secondary | ICD-10-CM | POA: Diagnosis not present

## 2020-11-30 DIAGNOSIS — Z6820 Body mass index (BMI) 20.0-20.9, adult: Secondary | ICD-10-CM | POA: Diagnosis not present

## 2020-11-30 DIAGNOSIS — Z9079 Acquired absence of other genital organ(s): Secondary | ICD-10-CM | POA: Diagnosis not present

## 2020-11-30 DIAGNOSIS — R220 Localized swelling, mass and lump, head: Secondary | ICD-10-CM | POA: Diagnosis not present

## 2020-11-30 DIAGNOSIS — Z87891 Personal history of nicotine dependence: Secondary | ICD-10-CM | POA: Diagnosis not present

## 2020-11-30 DIAGNOSIS — Z741 Need for assistance with personal care: Secondary | ICD-10-CM | POA: Diagnosis not present

## 2020-11-30 DIAGNOSIS — F17220 Nicotine dependence, chewing tobacco, uncomplicated: Secondary | ICD-10-CM | POA: Diagnosis not present

## 2020-11-30 DIAGNOSIS — R5382 Chronic fatigue, unspecified: Secondary | ICD-10-CM | POA: Diagnosis not present

## 2020-11-30 DIAGNOSIS — C61 Malignant neoplasm of prostate: Secondary | ICD-10-CM | POA: Diagnosis not present

## 2020-11-30 DIAGNOSIS — M4802 Spinal stenosis, cervical region: Secondary | ICD-10-CM | POA: Diagnosis not present

## 2020-12-01 DIAGNOSIS — R220 Localized swelling, mass and lump, head: Secondary | ICD-10-CM | POA: Diagnosis not present

## 2020-12-01 DIAGNOSIS — F17220 Nicotine dependence, chewing tobacco, uncomplicated: Secondary | ICD-10-CM | POA: Diagnosis not present

## 2020-12-01 DIAGNOSIS — Z87891 Personal history of nicotine dependence: Secondary | ICD-10-CM | POA: Diagnosis not present

## 2020-12-01 DIAGNOSIS — Z9079 Acquired absence of other genital organ(s): Secondary | ICD-10-CM | POA: Diagnosis not present

## 2020-12-01 DIAGNOSIS — C61 Malignant neoplasm of prostate: Secondary | ICD-10-CM | POA: Diagnosis not present

## 2020-12-01 DIAGNOSIS — Z741 Need for assistance with personal care: Secondary | ICD-10-CM | POA: Diagnosis not present

## 2020-12-01 DIAGNOSIS — Z6820 Body mass index (BMI) 20.0-20.9, adult: Secondary | ICD-10-CM | POA: Diagnosis not present

## 2020-12-01 DIAGNOSIS — M4802 Spinal stenosis, cervical region: Secondary | ICD-10-CM | POA: Diagnosis not present

## 2020-12-01 DIAGNOSIS — R5382 Chronic fatigue, unspecified: Secondary | ICD-10-CM | POA: Diagnosis not present

## 2020-12-02 DIAGNOSIS — R220 Localized swelling, mass and lump, head: Secondary | ICD-10-CM | POA: Diagnosis not present

## 2020-12-02 DIAGNOSIS — M4802 Spinal stenosis, cervical region: Secondary | ICD-10-CM | POA: Diagnosis not present

## 2020-12-02 DIAGNOSIS — C61 Malignant neoplasm of prostate: Secondary | ICD-10-CM | POA: Diagnosis not present

## 2020-12-02 DIAGNOSIS — F17220 Nicotine dependence, chewing tobacco, uncomplicated: Secondary | ICD-10-CM | POA: Diagnosis not present

## 2020-12-02 DIAGNOSIS — R5382 Chronic fatigue, unspecified: Secondary | ICD-10-CM | POA: Diagnosis not present

## 2020-12-02 DIAGNOSIS — R159 Full incontinence of feces: Secondary | ICD-10-CM | POA: Diagnosis not present

## 2020-12-02 DIAGNOSIS — L89012 Pressure ulcer of right elbow, stage 2: Secondary | ICD-10-CM | POA: Diagnosis not present

## 2020-12-02 DIAGNOSIS — L89511 Pressure ulcer of right ankle, stage 1: Secondary | ICD-10-CM | POA: Diagnosis not present

## 2020-12-02 DIAGNOSIS — R32 Unspecified urinary incontinence: Secondary | ICD-10-CM | POA: Diagnosis not present

## 2020-12-03 DIAGNOSIS — F17220 Nicotine dependence, chewing tobacco, uncomplicated: Secondary | ICD-10-CM | POA: Diagnosis not present

## 2020-12-03 DIAGNOSIS — R220 Localized swelling, mass and lump, head: Secondary | ICD-10-CM | POA: Diagnosis not present

## 2020-12-03 DIAGNOSIS — R32 Unspecified urinary incontinence: Secondary | ICD-10-CM | POA: Diagnosis not present

## 2020-12-03 DIAGNOSIS — R5382 Chronic fatigue, unspecified: Secondary | ICD-10-CM | POA: Diagnosis not present

## 2020-12-03 DIAGNOSIS — C61 Malignant neoplasm of prostate: Secondary | ICD-10-CM | POA: Diagnosis not present

## 2020-12-03 DIAGNOSIS — M4802 Spinal stenosis, cervical region: Secondary | ICD-10-CM | POA: Diagnosis not present

## 2020-12-03 DIAGNOSIS — L89511 Pressure ulcer of right ankle, stage 1: Secondary | ICD-10-CM | POA: Diagnosis not present

## 2020-12-03 DIAGNOSIS — L89012 Pressure ulcer of right elbow, stage 2: Secondary | ICD-10-CM | POA: Diagnosis not present

## 2020-12-03 DIAGNOSIS — R159 Full incontinence of feces: Secondary | ICD-10-CM | POA: Diagnosis not present

## 2020-12-04 DIAGNOSIS — C61 Malignant neoplasm of prostate: Secondary | ICD-10-CM | POA: Diagnosis not present

## 2020-12-04 DIAGNOSIS — R32 Unspecified urinary incontinence: Secondary | ICD-10-CM | POA: Diagnosis not present

## 2020-12-04 DIAGNOSIS — M4802 Spinal stenosis, cervical region: Secondary | ICD-10-CM | POA: Diagnosis not present

## 2020-12-04 DIAGNOSIS — F17220 Nicotine dependence, chewing tobacco, uncomplicated: Secondary | ICD-10-CM | POA: Diagnosis not present

## 2020-12-04 DIAGNOSIS — L89511 Pressure ulcer of right ankle, stage 1: Secondary | ICD-10-CM | POA: Diagnosis not present

## 2020-12-04 DIAGNOSIS — R5382 Chronic fatigue, unspecified: Secondary | ICD-10-CM | POA: Diagnosis not present

## 2020-12-04 DIAGNOSIS — L89012 Pressure ulcer of right elbow, stage 2: Secondary | ICD-10-CM | POA: Diagnosis not present

## 2020-12-04 DIAGNOSIS — R220 Localized swelling, mass and lump, head: Secondary | ICD-10-CM | POA: Diagnosis not present

## 2020-12-04 DIAGNOSIS — R159 Full incontinence of feces: Secondary | ICD-10-CM | POA: Diagnosis not present

## 2020-12-05 DIAGNOSIS — C61 Malignant neoplasm of prostate: Secondary | ICD-10-CM | POA: Diagnosis not present

## 2020-12-05 DIAGNOSIS — R32 Unspecified urinary incontinence: Secondary | ICD-10-CM | POA: Diagnosis not present

## 2020-12-05 DIAGNOSIS — F17220 Nicotine dependence, chewing tobacco, uncomplicated: Secondary | ICD-10-CM | POA: Diagnosis not present

## 2020-12-05 DIAGNOSIS — M4802 Spinal stenosis, cervical region: Secondary | ICD-10-CM | POA: Diagnosis not present

## 2020-12-05 DIAGNOSIS — R159 Full incontinence of feces: Secondary | ICD-10-CM | POA: Diagnosis not present

## 2020-12-05 DIAGNOSIS — R5382 Chronic fatigue, unspecified: Secondary | ICD-10-CM | POA: Diagnosis not present

## 2020-12-05 DIAGNOSIS — L89511 Pressure ulcer of right ankle, stage 1: Secondary | ICD-10-CM | POA: Diagnosis not present

## 2020-12-05 DIAGNOSIS — L89012 Pressure ulcer of right elbow, stage 2: Secondary | ICD-10-CM | POA: Diagnosis not present

## 2020-12-05 DIAGNOSIS — R220 Localized swelling, mass and lump, head: Secondary | ICD-10-CM | POA: Diagnosis not present

## 2020-12-06 ENCOUNTER — Other Ambulatory Visit: Payer: Self-pay | Admitting: Hospice and Palliative Medicine

## 2020-12-06 ENCOUNTER — Other Ambulatory Visit: Payer: Self-pay

## 2020-12-06 DIAGNOSIS — R5382 Chronic fatigue, unspecified: Secondary | ICD-10-CM | POA: Diagnosis not present

## 2020-12-06 DIAGNOSIS — R220 Localized swelling, mass and lump, head: Secondary | ICD-10-CM | POA: Diagnosis not present

## 2020-12-06 DIAGNOSIS — R159 Full incontinence of feces: Secondary | ICD-10-CM | POA: Diagnosis not present

## 2020-12-06 DIAGNOSIS — L89012 Pressure ulcer of right elbow, stage 2: Secondary | ICD-10-CM | POA: Diagnosis not present

## 2020-12-06 DIAGNOSIS — F17220 Nicotine dependence, chewing tobacco, uncomplicated: Secondary | ICD-10-CM | POA: Diagnosis not present

## 2020-12-06 DIAGNOSIS — R32 Unspecified urinary incontinence: Secondary | ICD-10-CM | POA: Diagnosis not present

## 2020-12-06 DIAGNOSIS — C61 Malignant neoplasm of prostate: Secondary | ICD-10-CM | POA: Diagnosis not present

## 2020-12-06 DIAGNOSIS — M4802 Spinal stenosis, cervical region: Secondary | ICD-10-CM | POA: Diagnosis not present

## 2020-12-06 DIAGNOSIS — L89511 Pressure ulcer of right ankle, stage 1: Secondary | ICD-10-CM | POA: Diagnosis not present

## 2020-12-06 MED ORDER — MORPHINE SULFATE (CONCENTRATE) 10 MG /0.5 ML PO SOLN
20.0000 mg | ORAL | 0 refills | Status: DC | PRN
  Filled 2020-12-06: qty 60, 5d supply, fill #0

## 2020-12-06 NOTE — Progress Notes (Signed)
Received a call from patient's hospice nurse, Amy.  Patient continues to decline.  He is sleeping more and oral intake is minimal.  Family has been giving morphine 20 to 30 mg for patient's comfort.  Refill requested morphine elixir.  Rx sent to pharmacy.

## 2020-12-07 DIAGNOSIS — R5382 Chronic fatigue, unspecified: Secondary | ICD-10-CM | POA: Diagnosis not present

## 2020-12-07 DIAGNOSIS — R159 Full incontinence of feces: Secondary | ICD-10-CM | POA: Diagnosis not present

## 2020-12-07 DIAGNOSIS — C61 Malignant neoplasm of prostate: Secondary | ICD-10-CM | POA: Diagnosis not present

## 2020-12-07 DIAGNOSIS — L89511 Pressure ulcer of right ankle, stage 1: Secondary | ICD-10-CM | POA: Diagnosis not present

## 2020-12-07 DIAGNOSIS — M4802 Spinal stenosis, cervical region: Secondary | ICD-10-CM | POA: Diagnosis not present

## 2020-12-07 DIAGNOSIS — F17220 Nicotine dependence, chewing tobacco, uncomplicated: Secondary | ICD-10-CM | POA: Diagnosis not present

## 2020-12-07 DIAGNOSIS — R220 Localized swelling, mass and lump, head: Secondary | ICD-10-CM | POA: Diagnosis not present

## 2020-12-07 DIAGNOSIS — L89012 Pressure ulcer of right elbow, stage 2: Secondary | ICD-10-CM | POA: Diagnosis not present

## 2020-12-07 DIAGNOSIS — R32 Unspecified urinary incontinence: Secondary | ICD-10-CM | POA: Diagnosis not present

## 2020-12-08 ENCOUNTER — Other Ambulatory Visit: Payer: Self-pay

## 2020-12-08 DIAGNOSIS — L89511 Pressure ulcer of right ankle, stage 1: Secondary | ICD-10-CM | POA: Diagnosis not present

## 2020-12-08 DIAGNOSIS — M4802 Spinal stenosis, cervical region: Secondary | ICD-10-CM | POA: Diagnosis not present

## 2020-12-08 DIAGNOSIS — R5382 Chronic fatigue, unspecified: Secondary | ICD-10-CM | POA: Diagnosis not present

## 2020-12-08 DIAGNOSIS — L89012 Pressure ulcer of right elbow, stage 2: Secondary | ICD-10-CM | POA: Diagnosis not present

## 2020-12-08 DIAGNOSIS — C61 Malignant neoplasm of prostate: Secondary | ICD-10-CM | POA: Diagnosis not present

## 2020-12-08 DIAGNOSIS — R159 Full incontinence of feces: Secondary | ICD-10-CM | POA: Diagnosis not present

## 2020-12-08 DIAGNOSIS — F17220 Nicotine dependence, chewing tobacco, uncomplicated: Secondary | ICD-10-CM | POA: Diagnosis not present

## 2020-12-08 DIAGNOSIS — R220 Localized swelling, mass and lump, head: Secondary | ICD-10-CM | POA: Diagnosis not present

## 2020-12-08 DIAGNOSIS — R32 Unspecified urinary incontinence: Secondary | ICD-10-CM | POA: Diagnosis not present

## 2020-12-08 MED ORDER — FLUCONAZOLE 100 MG PO TABS
ORAL_TABLET | ORAL | 0 refills | Status: AC
  Filled 2020-12-08: qty 7, 7d supply, fill #0

## 2020-12-09 ENCOUNTER — Telehealth: Payer: Self-pay | Admitting: *Deleted

## 2020-12-09 DIAGNOSIS — R220 Localized swelling, mass and lump, head: Secondary | ICD-10-CM | POA: Diagnosis not present

## 2020-12-09 DIAGNOSIS — M4802 Spinal stenosis, cervical region: Secondary | ICD-10-CM | POA: Diagnosis not present

## 2020-12-09 DIAGNOSIS — L89511 Pressure ulcer of right ankle, stage 1: Secondary | ICD-10-CM | POA: Diagnosis not present

## 2020-12-09 DIAGNOSIS — F17220 Nicotine dependence, chewing tobacco, uncomplicated: Secondary | ICD-10-CM | POA: Diagnosis not present

## 2020-12-09 DIAGNOSIS — L89012 Pressure ulcer of right elbow, stage 2: Secondary | ICD-10-CM | POA: Diagnosis not present

## 2020-12-09 DIAGNOSIS — R5382 Chronic fatigue, unspecified: Secondary | ICD-10-CM | POA: Diagnosis not present

## 2020-12-09 DIAGNOSIS — R159 Full incontinence of feces: Secondary | ICD-10-CM | POA: Diagnosis not present

## 2020-12-09 DIAGNOSIS — C61 Malignant neoplasm of prostate: Secondary | ICD-10-CM | POA: Diagnosis not present

## 2020-12-09 DIAGNOSIS — R32 Unspecified urinary incontinence: Secondary | ICD-10-CM | POA: Diagnosis not present

## 2020-12-09 NOTE — Telephone Encounter (Signed)
Rcvd FMLA papers for pt's wife.  FMLA completed and signed by Dr. B and faxed back to [employer]. Will send mychart msg to pt's wife to inform her that this has been completed.

## 2020-12-10 DIAGNOSIS — M4802 Spinal stenosis, cervical region: Secondary | ICD-10-CM | POA: Diagnosis not present

## 2020-12-10 DIAGNOSIS — R159 Full incontinence of feces: Secondary | ICD-10-CM | POA: Diagnosis not present

## 2020-12-10 DIAGNOSIS — F17220 Nicotine dependence, chewing tobacco, uncomplicated: Secondary | ICD-10-CM | POA: Diagnosis not present

## 2020-12-10 DIAGNOSIS — L89511 Pressure ulcer of right ankle, stage 1: Secondary | ICD-10-CM | POA: Diagnosis not present

## 2020-12-10 DIAGNOSIS — R32 Unspecified urinary incontinence: Secondary | ICD-10-CM | POA: Diagnosis not present

## 2020-12-10 DIAGNOSIS — R220 Localized swelling, mass and lump, head: Secondary | ICD-10-CM | POA: Diagnosis not present

## 2020-12-10 DIAGNOSIS — L89012 Pressure ulcer of right elbow, stage 2: Secondary | ICD-10-CM | POA: Diagnosis not present

## 2020-12-10 DIAGNOSIS — C61 Malignant neoplasm of prostate: Secondary | ICD-10-CM | POA: Diagnosis not present

## 2020-12-10 DIAGNOSIS — R5382 Chronic fatigue, unspecified: Secondary | ICD-10-CM | POA: Diagnosis not present

## 2020-12-11 DIAGNOSIS — C61 Malignant neoplasm of prostate: Secondary | ICD-10-CM | POA: Diagnosis not present

## 2020-12-11 DIAGNOSIS — R5382 Chronic fatigue, unspecified: Secondary | ICD-10-CM | POA: Diagnosis not present

## 2020-12-11 DIAGNOSIS — L89511 Pressure ulcer of right ankle, stage 1: Secondary | ICD-10-CM | POA: Diagnosis not present

## 2020-12-11 DIAGNOSIS — R159 Full incontinence of feces: Secondary | ICD-10-CM | POA: Diagnosis not present

## 2020-12-11 DIAGNOSIS — R220 Localized swelling, mass and lump, head: Secondary | ICD-10-CM | POA: Diagnosis not present

## 2020-12-11 DIAGNOSIS — R32 Unspecified urinary incontinence: Secondary | ICD-10-CM | POA: Diagnosis not present

## 2020-12-11 DIAGNOSIS — F17220 Nicotine dependence, chewing tobacco, uncomplicated: Secondary | ICD-10-CM | POA: Diagnosis not present

## 2020-12-11 DIAGNOSIS — L89012 Pressure ulcer of right elbow, stage 2: Secondary | ICD-10-CM | POA: Diagnosis not present

## 2020-12-11 DIAGNOSIS — M4802 Spinal stenosis, cervical region: Secondary | ICD-10-CM | POA: Diagnosis not present

## 2020-12-12 DIAGNOSIS — M4802 Spinal stenosis, cervical region: Secondary | ICD-10-CM | POA: Diagnosis not present

## 2020-12-12 DIAGNOSIS — F17220 Nicotine dependence, chewing tobacco, uncomplicated: Secondary | ICD-10-CM | POA: Diagnosis not present

## 2020-12-12 DIAGNOSIS — R5382 Chronic fatigue, unspecified: Secondary | ICD-10-CM | POA: Diagnosis not present

## 2020-12-12 DIAGNOSIS — C61 Malignant neoplasm of prostate: Secondary | ICD-10-CM | POA: Diagnosis not present

## 2020-12-12 DIAGNOSIS — L89012 Pressure ulcer of right elbow, stage 2: Secondary | ICD-10-CM | POA: Diagnosis not present

## 2020-12-12 DIAGNOSIS — R159 Full incontinence of feces: Secondary | ICD-10-CM | POA: Diagnosis not present

## 2020-12-12 DIAGNOSIS — R220 Localized swelling, mass and lump, head: Secondary | ICD-10-CM | POA: Diagnosis not present

## 2020-12-12 DIAGNOSIS — L89511 Pressure ulcer of right ankle, stage 1: Secondary | ICD-10-CM | POA: Diagnosis not present

## 2020-12-12 DIAGNOSIS — R32 Unspecified urinary incontinence: Secondary | ICD-10-CM | POA: Diagnosis not present

## 2020-12-13 ENCOUNTER — Other Ambulatory Visit: Payer: Self-pay | Admitting: Hospice and Palliative Medicine

## 2020-12-13 ENCOUNTER — Other Ambulatory Visit: Payer: Self-pay

## 2020-12-13 DIAGNOSIS — C61 Malignant neoplasm of prostate: Secondary | ICD-10-CM | POA: Diagnosis not present

## 2020-12-13 DIAGNOSIS — R220 Localized swelling, mass and lump, head: Secondary | ICD-10-CM | POA: Diagnosis not present

## 2020-12-13 DIAGNOSIS — L89012 Pressure ulcer of right elbow, stage 2: Secondary | ICD-10-CM | POA: Diagnosis not present

## 2020-12-13 DIAGNOSIS — M4802 Spinal stenosis, cervical region: Secondary | ICD-10-CM | POA: Diagnosis not present

## 2020-12-13 DIAGNOSIS — R32 Unspecified urinary incontinence: Secondary | ICD-10-CM | POA: Diagnosis not present

## 2020-12-13 DIAGNOSIS — R5382 Chronic fatigue, unspecified: Secondary | ICD-10-CM | POA: Diagnosis not present

## 2020-12-13 DIAGNOSIS — F17220 Nicotine dependence, chewing tobacco, uncomplicated: Secondary | ICD-10-CM | POA: Diagnosis not present

## 2020-12-13 DIAGNOSIS — R159 Full incontinence of feces: Secondary | ICD-10-CM | POA: Diagnosis not present

## 2020-12-13 DIAGNOSIS — L89511 Pressure ulcer of right ankle, stage 1: Secondary | ICD-10-CM | POA: Diagnosis not present

## 2020-12-13 MED ORDER — LORAZEPAM 0.5 MG PO TABS
ORAL_TABLET | ORAL | 0 refills | Status: AC
  Filled 2020-12-13: qty 84, 14d supply, fill #0

## 2020-12-13 MED ORDER — MORPHINE SULFATE (CONCENTRATE) 10 MG /0.5 ML PO SOLN
20.0000 mg | ORAL | 0 refills | Status: AC | PRN
Start: 2020-12-13 — End: ?
  Filled 2020-12-13: qty 60, 4d supply, fill #0

## 2020-12-13 NOTE — Progress Notes (Signed)
Hospice RN requested refills of morphine and lorazepam. Pharmacy.

## 2020-12-14 ENCOUNTER — Telehealth: Payer: Self-pay | Admitting: *Deleted

## 2020-12-14 DIAGNOSIS — L89511 Pressure ulcer of right ankle, stage 1: Secondary | ICD-10-CM | POA: Diagnosis not present

## 2020-12-14 DIAGNOSIS — C61 Malignant neoplasm of prostate: Secondary | ICD-10-CM | POA: Diagnosis not present

## 2020-12-14 DIAGNOSIS — R32 Unspecified urinary incontinence: Secondary | ICD-10-CM | POA: Diagnosis not present

## 2020-12-14 DIAGNOSIS — M4802 Spinal stenosis, cervical region: Secondary | ICD-10-CM | POA: Diagnosis not present

## 2020-12-14 DIAGNOSIS — L89012 Pressure ulcer of right elbow, stage 2: Secondary | ICD-10-CM | POA: Diagnosis not present

## 2020-12-14 DIAGNOSIS — R220 Localized swelling, mass and lump, head: Secondary | ICD-10-CM | POA: Diagnosis not present

## 2020-12-14 DIAGNOSIS — R159 Full incontinence of feces: Secondary | ICD-10-CM | POA: Diagnosis not present

## 2020-12-14 DIAGNOSIS — F17220 Nicotine dependence, chewing tobacco, uncomplicated: Secondary | ICD-10-CM | POA: Diagnosis not present

## 2020-12-14 DIAGNOSIS — R5382 Chronic fatigue, unspecified: Secondary | ICD-10-CM | POA: Diagnosis not present

## 2020-12-14 NOTE — Telephone Encounter (Signed)
Wife sent Nira Conn, RN a mychart msg to call her regarding FMLA papers. I personally reached out to the patient's wife via telephone.  Patient's wife now needs continous FMLA rather than intermittent FMLA and she is requesting that this get changed on the FMLA forms that were submitted last week. Pt's health is declining. Wife is concerned that patient may pass away over the holidays. Wife has not been able to work since 11/17 and would like to have continuous leave documented until 01/17/2021 (subject to change-based on pt's ongoing hospice care).  Will see if Lauren or Sonia Baller, NPs could cosign on the change on fmla form and then resubmit FMLA and refax.

## 2020-12-15 DIAGNOSIS — C61 Malignant neoplasm of prostate: Secondary | ICD-10-CM | POA: Diagnosis not present

## 2020-12-15 DIAGNOSIS — R32 Unspecified urinary incontinence: Secondary | ICD-10-CM | POA: Diagnosis not present

## 2020-12-15 DIAGNOSIS — R220 Localized swelling, mass and lump, head: Secondary | ICD-10-CM | POA: Diagnosis not present

## 2020-12-15 DIAGNOSIS — F17220 Nicotine dependence, chewing tobacco, uncomplicated: Secondary | ICD-10-CM | POA: Diagnosis not present

## 2020-12-15 DIAGNOSIS — L89511 Pressure ulcer of right ankle, stage 1: Secondary | ICD-10-CM | POA: Diagnosis not present

## 2020-12-15 DIAGNOSIS — M4802 Spinal stenosis, cervical region: Secondary | ICD-10-CM | POA: Diagnosis not present

## 2020-12-15 DIAGNOSIS — R5382 Chronic fatigue, unspecified: Secondary | ICD-10-CM | POA: Diagnosis not present

## 2020-12-15 DIAGNOSIS — R159 Full incontinence of feces: Secondary | ICD-10-CM | POA: Diagnosis not present

## 2020-12-15 DIAGNOSIS — L89012 Pressure ulcer of right elbow, stage 2: Secondary | ICD-10-CM | POA: Diagnosis not present

## 2020-12-15 NOTE — Telephone Encounter (Signed)
Fmla faxed on 12/14/20- fax confirmation rcvd.

## 2020-12-16 ENCOUNTER — Other Ambulatory Visit: Payer: Self-pay | Admitting: Hospice and Palliative Medicine

## 2020-12-16 ENCOUNTER — Other Ambulatory Visit: Payer: Self-pay

## 2020-12-16 DIAGNOSIS — R220 Localized swelling, mass and lump, head: Secondary | ICD-10-CM | POA: Diagnosis not present

## 2020-12-16 DIAGNOSIS — R32 Unspecified urinary incontinence: Secondary | ICD-10-CM | POA: Diagnosis not present

## 2020-12-16 DIAGNOSIS — R5382 Chronic fatigue, unspecified: Secondary | ICD-10-CM | POA: Diagnosis not present

## 2020-12-16 DIAGNOSIS — M4802 Spinal stenosis, cervical region: Secondary | ICD-10-CM | POA: Diagnosis not present

## 2020-12-16 DIAGNOSIS — R159 Full incontinence of feces: Secondary | ICD-10-CM | POA: Diagnosis not present

## 2020-12-16 DIAGNOSIS — L89511 Pressure ulcer of right ankle, stage 1: Secondary | ICD-10-CM | POA: Diagnosis not present

## 2020-12-16 DIAGNOSIS — L89012 Pressure ulcer of right elbow, stage 2: Secondary | ICD-10-CM | POA: Diagnosis not present

## 2020-12-16 DIAGNOSIS — C61 Malignant neoplasm of prostate: Secondary | ICD-10-CM | POA: Diagnosis not present

## 2020-12-16 DIAGNOSIS — F17220 Nicotine dependence, chewing tobacco, uncomplicated: Secondary | ICD-10-CM | POA: Diagnosis not present

## 2020-12-16 MED ORDER — FENTANYL 100 MCG/HR TD PT72
MEDICATED_PATCH | TRANSDERMAL | 0 refills | Status: AC
Start: 2020-12-16 — End: ?
  Filled 2020-12-16: qty 10, 20d supply, fill #0

## 2020-12-16 MED ORDER — ANTI-ITCH 0.5-0.5 % EX LOTN
TOPICAL_LOTION | CUTANEOUS | 0 refills | Status: AC
  Filled 2020-12-16: qty 222, 10d supply, fill #0

## 2020-12-16 MED ORDER — FENTANYL 50 MCG/HR TD PT72
MEDICATED_PATCH | TRANSDERMAL | 0 refills | Status: AC
  Filled 2020-12-16: qty 10, 20d supply, fill #0

## 2020-12-16 NOTE — Progress Notes (Signed)
Patient's hospice nurse, Amy, requested refill of fentanyl patches.  We will send referral

## 2020-12-17 ENCOUNTER — Other Ambulatory Visit: Payer: Self-pay

## 2020-12-17 DIAGNOSIS — L89511 Pressure ulcer of right ankle, stage 1: Secondary | ICD-10-CM | POA: Diagnosis not present

## 2020-12-17 DIAGNOSIS — M4802 Spinal stenosis, cervical region: Secondary | ICD-10-CM | POA: Diagnosis not present

## 2020-12-17 DIAGNOSIS — R220 Localized swelling, mass and lump, head: Secondary | ICD-10-CM | POA: Diagnosis not present

## 2020-12-17 DIAGNOSIS — R32 Unspecified urinary incontinence: Secondary | ICD-10-CM | POA: Diagnosis not present

## 2020-12-17 DIAGNOSIS — C61 Malignant neoplasm of prostate: Secondary | ICD-10-CM | POA: Diagnosis not present

## 2020-12-17 DIAGNOSIS — F17220 Nicotine dependence, chewing tobacco, uncomplicated: Secondary | ICD-10-CM | POA: Diagnosis not present

## 2020-12-17 DIAGNOSIS — R5382 Chronic fatigue, unspecified: Secondary | ICD-10-CM | POA: Diagnosis not present

## 2020-12-17 DIAGNOSIS — R159 Full incontinence of feces: Secondary | ICD-10-CM | POA: Diagnosis not present

## 2020-12-17 DIAGNOSIS — L89012 Pressure ulcer of right elbow, stage 2: Secondary | ICD-10-CM | POA: Diagnosis not present

## 2020-12-18 DIAGNOSIS — L89012 Pressure ulcer of right elbow, stage 2: Secondary | ICD-10-CM | POA: Diagnosis not present

## 2020-12-18 DIAGNOSIS — R5382 Chronic fatigue, unspecified: Secondary | ICD-10-CM | POA: Diagnosis not present

## 2020-12-18 DIAGNOSIS — F17220 Nicotine dependence, chewing tobacco, uncomplicated: Secondary | ICD-10-CM | POA: Diagnosis not present

## 2020-12-18 DIAGNOSIS — C61 Malignant neoplasm of prostate: Secondary | ICD-10-CM | POA: Diagnosis not present

## 2020-12-18 DIAGNOSIS — R32 Unspecified urinary incontinence: Secondary | ICD-10-CM | POA: Diagnosis not present

## 2020-12-18 DIAGNOSIS — R159 Full incontinence of feces: Secondary | ICD-10-CM | POA: Diagnosis not present

## 2020-12-18 DIAGNOSIS — M4802 Spinal stenosis, cervical region: Secondary | ICD-10-CM | POA: Diagnosis not present

## 2020-12-18 DIAGNOSIS — R220 Localized swelling, mass and lump, head: Secondary | ICD-10-CM | POA: Diagnosis not present

## 2020-12-18 DIAGNOSIS — L89511 Pressure ulcer of right ankle, stage 1: Secondary | ICD-10-CM | POA: Diagnosis not present

## 2020-12-19 DIAGNOSIS — C61 Malignant neoplasm of prostate: Secondary | ICD-10-CM | POA: Diagnosis not present

## 2020-12-19 DIAGNOSIS — R159 Full incontinence of feces: Secondary | ICD-10-CM | POA: Diagnosis not present

## 2020-12-19 DIAGNOSIS — M4802 Spinal stenosis, cervical region: Secondary | ICD-10-CM | POA: Diagnosis not present

## 2020-12-19 DIAGNOSIS — R32 Unspecified urinary incontinence: Secondary | ICD-10-CM | POA: Diagnosis not present

## 2020-12-19 DIAGNOSIS — L89511 Pressure ulcer of right ankle, stage 1: Secondary | ICD-10-CM | POA: Diagnosis not present

## 2020-12-19 DIAGNOSIS — L89012 Pressure ulcer of right elbow, stage 2: Secondary | ICD-10-CM | POA: Diagnosis not present

## 2020-12-19 DIAGNOSIS — R220 Localized swelling, mass and lump, head: Secondary | ICD-10-CM | POA: Diagnosis not present

## 2020-12-19 DIAGNOSIS — R5382 Chronic fatigue, unspecified: Secondary | ICD-10-CM | POA: Diagnosis not present

## 2020-12-19 DIAGNOSIS — F17220 Nicotine dependence, chewing tobacco, uncomplicated: Secondary | ICD-10-CM | POA: Diagnosis not present

## 2020-12-29 ENCOUNTER — Other Ambulatory Visit: Payer: Self-pay

## 2021-01-02 DEATH — deceased

## 2021-12-15 IMAGING — MR MR HEAD WO/W CM
15 series · 48 of 48 positions shown · IV contrast (gadavist)
Comparison: 07/11/2013

CLINICAL DATA: Worsening headaches, nausea, and blurred vision.
History of prostate cancer with bone metastases.

EXAM:
MRI HEAD WITHOUT AND WITH CONTRAST
TECHNIQUE: Multiplanar, multiecho pulse sequences of the brain and surrounding
structures were obtained without and with intravenous contrast.
CONTRAST:  7mL GADAVIST GADOBUTROL 1 MMOL/ML IV SOLN

[Series 5: T1 · sagittal · 5.0mm · 0.62mm/px · 2 of 25 slices shown (1 of 2)]
[im 1/25]
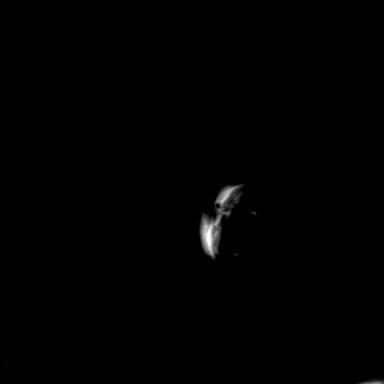
[im 25/25]
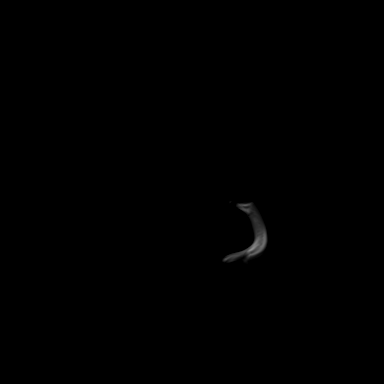

[Series 6: ax dwi_tracew · axial · 3.0mm · 0.71mm/px · z∈[-85,+75]mm · 3 of 56 slices shown]
[im 1/56]
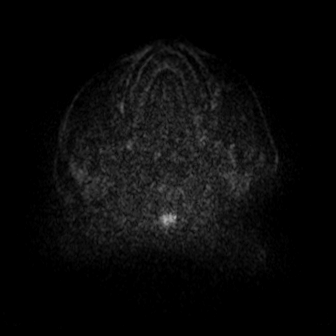
[im 28/56]
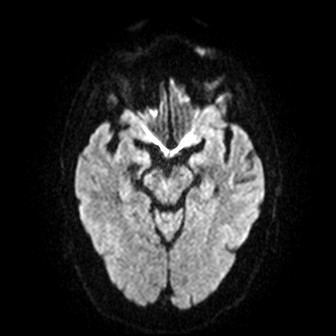
[im 56/56]
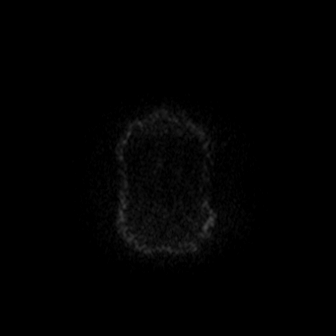

[Series 7: ax dwi_adc · axial · 3.0mm · 0.71mm/px · z∈[-85,+75]mm · 3 of 56 slices shown]
[im 1/56]
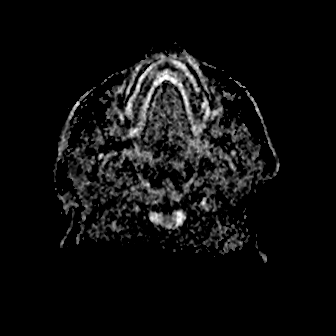
[im 28/56]
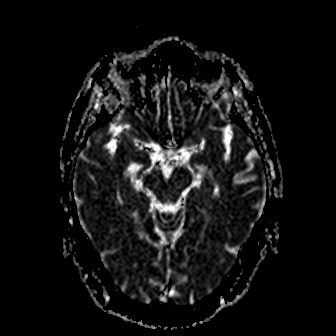
[im 56/56]
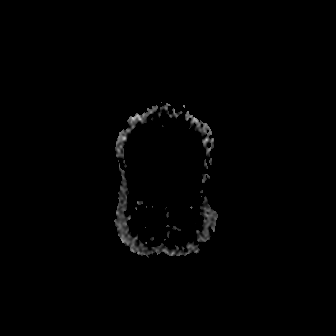

[Series 8: cor dwi_tracew · coronal · 5.0mm · 0.68mm/px · 2 of 40 slices shown]
[im 1/40]
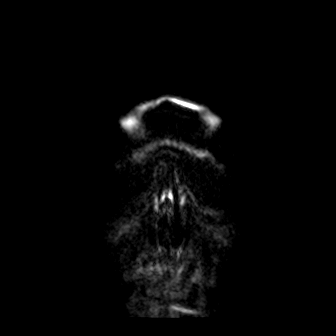
[im 40/40]
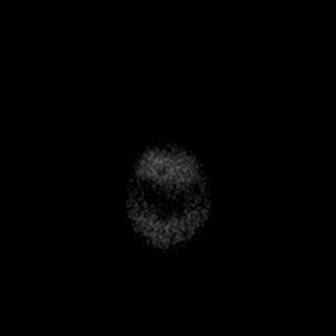

[Series 9: cor dwi_adc · coronal · 5.0mm · 0.68mm/px · 2 of 40 slices shown]
[im 1/40]
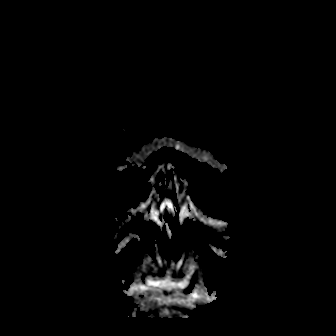
[im 40/40]
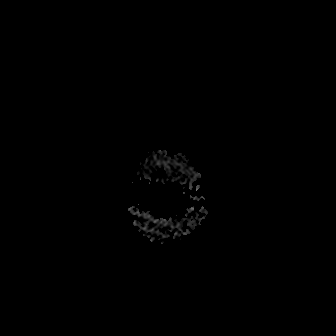

[Series 10: T2 · axial · 5.0mm · 0.53mm/px · 1 of 25 slices shown]
[im 1/25]
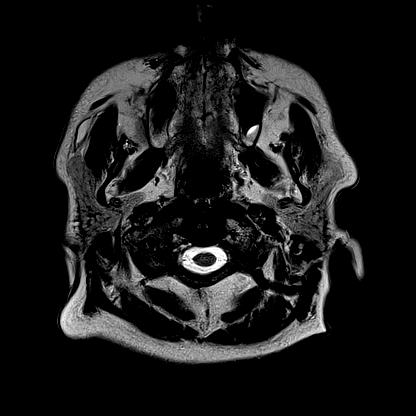

[Series 11: mag_images · axial · 3.0mm · 0.90mm/px · z∈[-88,+85]mm · 3 of 60 slices shown]
[im 1/60]
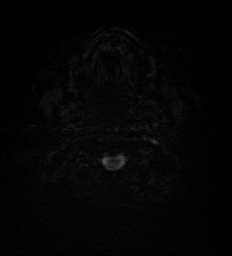
[im 30/60]
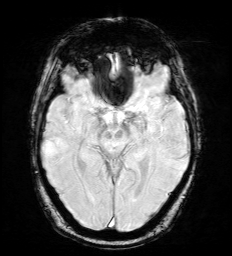
[im 60/60]
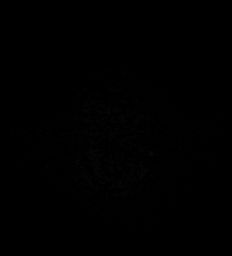

[Series 12: pha_images · axial · 3.0mm · 0.90mm/px · z∈[-88,+82]mm · 3 of 59 slices shown]
[im 1/59]
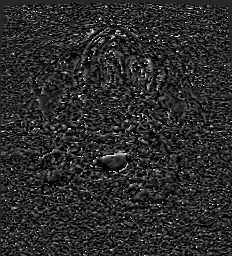
[im 30/59]
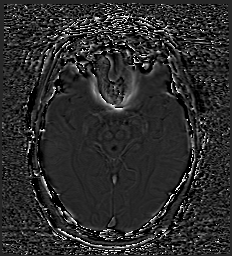
[im 59/59]
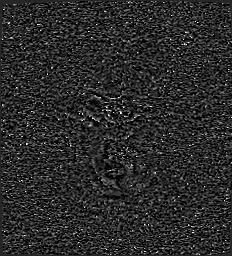

[Series 13: swi_images · axial · 3.0mm · 0.90mm/px · z∈[-88,+85]mm · 3 of 60 slices shown]
[im 1/60]
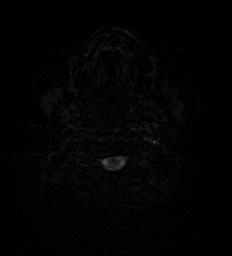
[im 30/60]
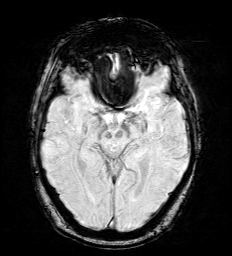
[im 60/60]
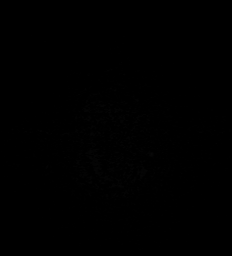

[Series 15: FLAIR · axial · 3.0mm · 0.53mm/px · z∈[-82,+77]mm · 3 of 55 slices shown]
[im 1/55]
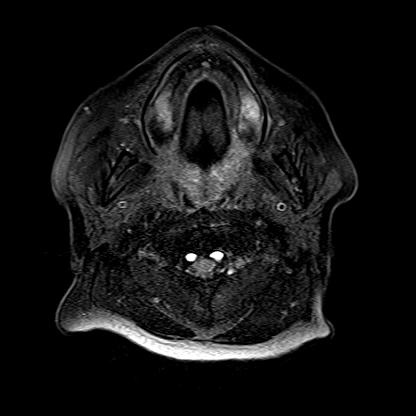
[im 28/55]
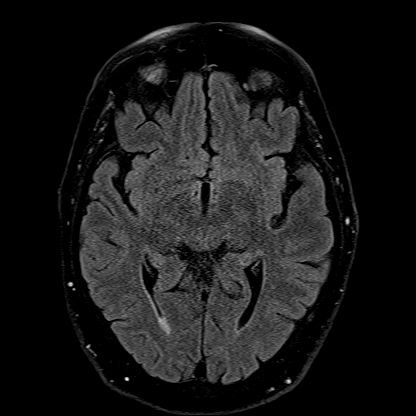
[im 55/55]
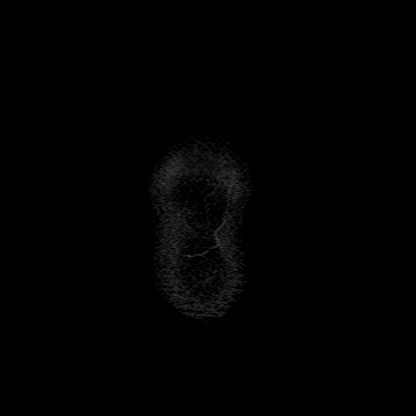

[Series 16: T1 · axial · 1.0mm · 0.98mm/px · z∈[-90,+81]mm · 9 of 176 slices shown (2 of 2)]
[im 1/176]
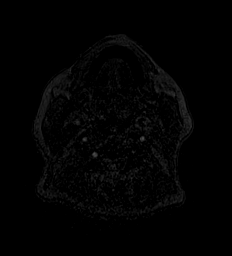
[im 22/176]
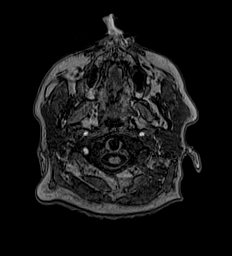
[im 44/176]
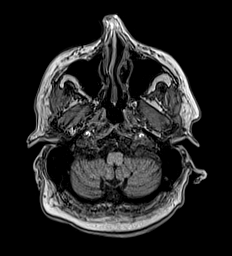
[im 66/176]
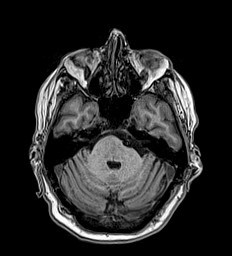
[im 88/176]
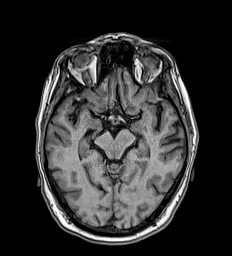
[im 110/176]
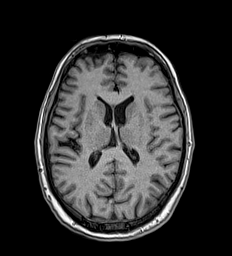
[im 132/176]
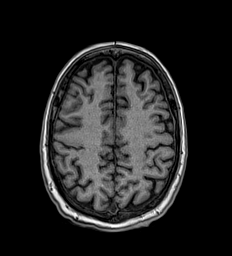
[im 154/176]
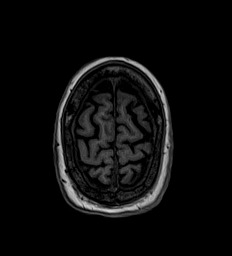
[im 176/176]
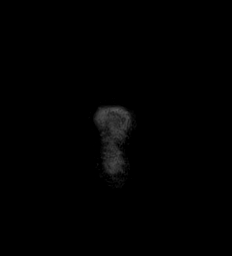

[Series 17: T2 post-contrast · coronal · 5.0mm · 0.57mm/px · 2 of 29 slices shown]
[im 1/29]
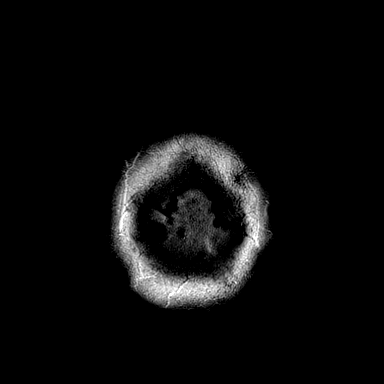
[im 29/29]
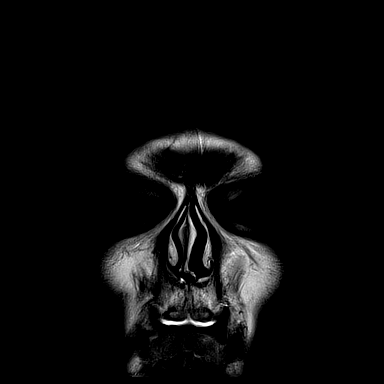

[Series 18: T1 post-contrast · axial · 1.0mm · 0.98mm/px · z∈[-90,+81]mm · 9 of 176 slices shown (1 of 3)]
[im 1/176]
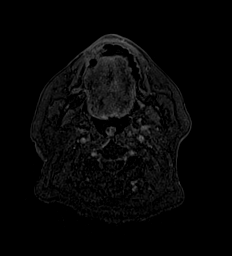
[im 22/176]
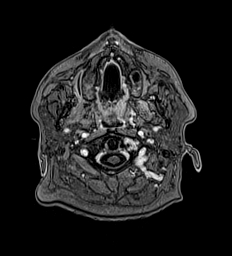
[im 44/176]
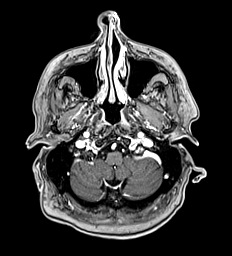
[im 66/176]
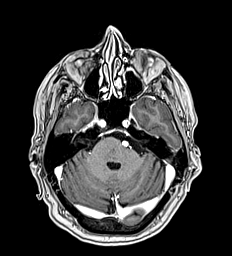
[im 88/176]
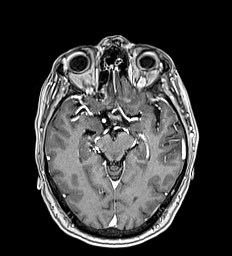
[im 110/176]
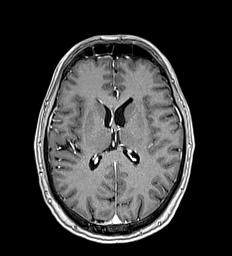
[im 132/176]
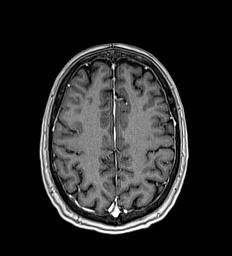
[im 154/176]
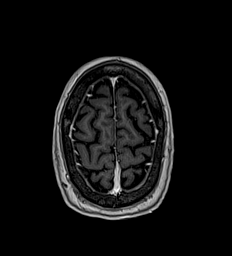
[im 176/176]
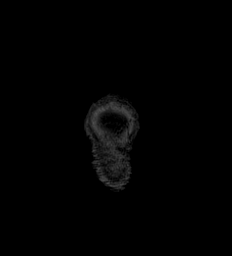

[Series 19: T1 post-contrast · coronal · 5.0mm · 0.57mm/px · 2 of 29 slices shown (2 of 3)]
[im 1/29]
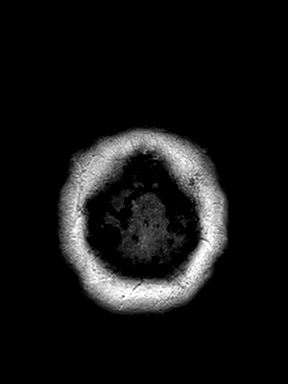
[im 29/29]
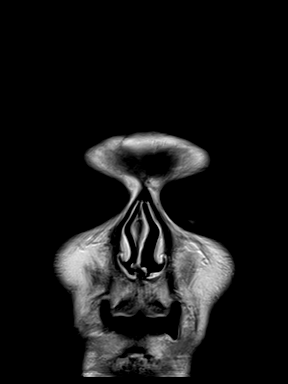

[Series 20: T1 post-contrast · sagittal · 5.0mm · 0.62mm/px · 1 of 25 slices shown (3 of 3)]
[im 1/25]
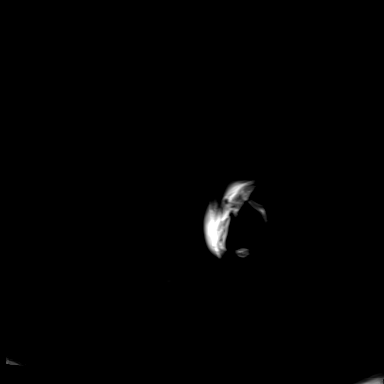

[48 of 48 positions shown; findings below may reference images not displayed]

FINDINGS: Brain: No acute infarct, midline shift, or extra-axial fluid
collection is identified. The ventricles and sulci are normal.
Patchy T2 hyperintensities in the pons are nonspecific but
compatible with chronic small vessel ischemic disease. No
significant supratentorial white matter disease is present. There is
a 7 mm enhancing lesion in the right aspect of the pituitary gland
(previously 5 mm. The pituitary infundibulum is minimally deviated
to the left, however there is no significant regional mass effect.
No abnormal intracranial enhancement is identified elsewhere.

Vascular: Major intracranial vascular flow voids are preserved.

Skull and upper cervical spine: Multiple new skull lesions with the
largest located in the right frontal skull and with involvement
clivus noted as well. No extraosseous tumor.

Sinuses/Orbits: Unremarkable orbits. Small left mastoid effusion.
Clear paranasal sinuses.

Other: None.
IMPRESSION: 1. No evidence of brain metastases or acute intracranial
abnormality.
2. Multiple skull metastases, new from 2867. No dural or other
extraosseous tumor.
3. 7 mm pituitary lesion, slightly enlarged from 2867 and most
likely reflecting an incidental microadenoma.
4. Chronic small vessel ischemia in the pons.

## 2022-03-30 ENCOUNTER — Other Ambulatory Visit: Payer: Self-pay
